# Patient Record
Sex: Female | Born: 1955 | Race: White | Hispanic: No | Marital: Single | Smoking: Former smoker
Health system: Southern US, Community
[De-identification: ages and names within clinical notes are randomized; demographics above are authoritative.]

## PROBLEM LIST (undated history)

## (undated) DIAGNOSIS — K219 Gastro-esophageal reflux disease without esophagitis: Secondary | ICD-10-CM

## (undated) DIAGNOSIS — E079 Disorder of thyroid, unspecified: Secondary | ICD-10-CM

## (undated) DIAGNOSIS — J449 Chronic obstructive pulmonary disease, unspecified: Secondary | ICD-10-CM

## (undated) DIAGNOSIS — I639 Cerebral infarction, unspecified: Secondary | ICD-10-CM

## (undated) DIAGNOSIS — I10 Essential (primary) hypertension: Secondary | ICD-10-CM

## (undated) DIAGNOSIS — E785 Hyperlipidemia, unspecified: Secondary | ICD-10-CM

## (undated) DIAGNOSIS — E119 Type 2 diabetes mellitus without complications: Secondary | ICD-10-CM

## (undated) HISTORY — PX: APPENDECTOMY: SHX54

## (undated) HISTORY — DX: Disorder of thyroid, unspecified: E07.9

## (undated) HISTORY — PX: OTHER SURGICAL HISTORY: SHX169

## (undated) HISTORY — DX: Hyperlipidemia, unspecified: E78.5

## (undated) HISTORY — DX: Type 2 diabetes mellitus without complications: E11.9

## (undated) HISTORY — PX: TUMOR REMOVAL: SHX12

## (undated) HISTORY — DX: Essential (primary) hypertension: I10

## (undated) HISTORY — PX: BREAST BIOPSY: SHX20

## (undated) HISTORY — PX: TOTAL VAGINAL HYSTERECTOMY: SHX2548

---

## 2004-10-20 ENCOUNTER — Emergency Department: Payer: Self-pay | Admitting: Emergency Medicine

## 2006-12-24 DIAGNOSIS — M419 Scoliosis, unspecified: Secondary | ICD-10-CM | POA: Insufficient documentation

## 2006-12-24 DIAGNOSIS — M1990 Unspecified osteoarthritis, unspecified site: Secondary | ICD-10-CM | POA: Insufficient documentation

## 2006-12-24 DIAGNOSIS — E039 Hypothyroidism, unspecified: Secondary | ICD-10-CM | POA: Insufficient documentation

## 2006-12-26 ENCOUNTER — Emergency Department: Payer: Self-pay | Admitting: Emergency Medicine

## 2007-01-03 ENCOUNTER — Ambulatory Visit: Payer: Self-pay | Admitting: *Deleted

## 2007-06-14 ENCOUNTER — Ambulatory Visit: Payer: Self-pay

## 2008-10-06 ENCOUNTER — Emergency Department: Payer: Self-pay | Admitting: Emergency Medicine

## 2009-09-05 DIAGNOSIS — E7800 Pure hypercholesterolemia, unspecified: Secondary | ICD-10-CM | POA: Insufficient documentation

## 2010-12-10 ENCOUNTER — Emergency Department: Payer: Self-pay | Admitting: Unknown Physician Specialty

## 2011-03-09 ENCOUNTER — Emergency Department: Payer: Self-pay | Admitting: Emergency Medicine

## 2011-06-04 ENCOUNTER — Emergency Department: Payer: Self-pay | Admitting: *Deleted

## 2012-03-23 DIAGNOSIS — M6530 Trigger finger, unspecified finger: Secondary | ICD-10-CM | POA: Insufficient documentation

## 2012-03-23 DIAGNOSIS — J309 Allergic rhinitis, unspecified: Secondary | ICD-10-CM | POA: Insufficient documentation

## 2012-03-23 DIAGNOSIS — E559 Vitamin D deficiency, unspecified: Secondary | ICD-10-CM | POA: Insufficient documentation

## 2013-02-14 ENCOUNTER — Ambulatory Visit: Payer: Self-pay | Admitting: Family Medicine

## 2013-02-21 ENCOUNTER — Ambulatory Visit: Payer: Self-pay | Admitting: Family Medicine

## 2013-09-30 ENCOUNTER — Emergency Department: Payer: Self-pay | Admitting: Internal Medicine

## 2013-09-30 LAB — COMPREHENSIVE METABOLIC PANEL
Albumin: 3.7 g/dL (ref 3.4–5.0)
Alkaline Phosphatase: 118 U/L — ABNORMAL HIGH
Anion Gap: 6 — ABNORMAL LOW (ref 7–16)
BUN: 27 mg/dL — ABNORMAL HIGH (ref 7–18)
Co2: 31 mmol/L (ref 21–32)
EGFR (African American): >60
EGFR (Non-African Amer.): 59 — ABNORMAL LOW
Glucose: 207 mg/dL — ABNORMAL HIGH (ref 65–99)
Osmolality: 283 (ref 275–301)
Potassium: 3.7 mmol/L (ref 3.5–5.1)
SGOT(AST): 21 U/L (ref 15–37)
SGPT (ALT): 32 U/L (ref 12–78)
Sodium: 136 mmol/L (ref 136–145)
Total Protein: 7.3 g/dL (ref 6.4–8.2)

## 2013-09-30 LAB — CBC
MCV: 87 fL (ref 80–100)
RDW: 13.4 % (ref 11.5–14.5)
WBC: 7.4 10*3/uL (ref 3.6–11.0)

## 2013-09-30 LAB — PROTIME-INR
INR: 0.8
Prothrombin Time: 11.3 secs — ABNORMAL LOW (ref 11.5–14.7)

## 2013-09-30 LAB — TROPONIN I: Troponin-I: <0.02 ng/mL

## 2013-09-30 LAB — CK TOTAL AND CKMB (NOT AT ARMC): CK, Total: 80 U/L (ref 21–215)

## 2013-10-12 ENCOUNTER — Encounter: Payer: Self-pay | Admitting: Cardiovascular Disease

## 2013-10-12 ENCOUNTER — Ambulatory Visit (INDEPENDENT_AMBULATORY_CARE_PROVIDER_SITE_OTHER): Payer: Self-pay | Admitting: Cardiovascular Disease

## 2013-10-12 VITALS — BP 138/72 | HR 94 | Ht 61.0 in | Wt 187.8 lb

## 2013-10-12 DIAGNOSIS — R0789 Other chest pain: Secondary | ICD-10-CM

## 2013-10-12 DIAGNOSIS — F17200 Nicotine dependence, unspecified, uncomplicated: Secondary | ICD-10-CM

## 2013-10-12 DIAGNOSIS — I10 Essential (primary) hypertension: Secondary | ICD-10-CM

## 2013-10-12 DIAGNOSIS — E119 Type 2 diabetes mellitus without complications: Secondary | ICD-10-CM | POA: Insufficient documentation

## 2013-10-12 DIAGNOSIS — E669 Obesity, unspecified: Secondary | ICD-10-CM

## 2013-10-12 DIAGNOSIS — E1169 Type 2 diabetes mellitus with other specified complication: Secondary | ICD-10-CM | POA: Insufficient documentation

## 2013-10-12 MED ORDER — NITROGLYCERIN 0.4 MG SL SUBL: 0.4000 mg | SUBLINGUAL_TABLET | SUBLINGUAL | Status: DC | PRN

## 2013-10-12 NOTE — Assessment & Plan Note (Signed)
We have encouraged continued exercise, careful diet management in an effort to lose weight. 

## 2013-10-12 NOTE — Assessment & Plan Note (Signed)
Blood pressure is well controlled on today's visit. No changes made to the medications. She does complain about nocturia. Suggested she not drink as much fluids after dinner. Potentially could change HCTZ if symptoms persist. We'll try alternate blood pressure medication such as beta blockers as her heart rate is high, possibly even Cardizem

## 2013-10-12 NOTE — Assessment & Plan Note (Signed)
We have encouraged her to continue to work on weaning her cigarettes and smoking cessation. She will continue to work on this and does not want any assistance with chantix.  

## 2013-10-12 NOTE — Patient Instructions (Addendum)
You are doing well. Please take aspirin one a day  Take nitroglycerin under the tongue as needed   Goal total cholesterol  <150 LDL <70 Given that you are a smoker and diabetic  Call if you have more chest pain We would do a stress treadmill  test  Syndrome with the low blood pressures is vaso vagal, near-syncope Happens from Gi distress, or pain  Please call us if you have new issues that need to be addressed before your next appt.  Your physician wants you to follow-up in: 6 months.  You will receive a reminder letter in the mail two months in advance. If you don't receive a letter, please call our office to schedule the follow-up appointment.

## 2013-10-12 NOTE — Progress Notes (Signed)
Patient ID: Michele House, female    DOB: [birth year], 57 y.o.   MRN: [id number]  HPI Comments: Michele House is a 57 year old woman with obesity, diabetes, long smoking history of 40 years, hypertension who presents for recent episodes of chest pain.  She reports having 2 or 3 episodes of chest pain, the most recent episode 09/30/2013. Prior to that she had an episode 3 or 4 months ago. 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 She had a fluttering, tingling feeling in her mid sternum and felt "weird". Symptoms resolved ultimately quickly. She denies any significant symptoms with exertion on a regular basis. No further episodes since the end of November in the past 2 weeks.  She continues to smoke at least one pack per day. She reports she has borderline diabetes, try to do better on her diet. She reports her heart rate is sometimes elevated, worse with stress. No significant shortness of breath with exertion. Otherwise relatively active. She reports having frequent urination at nighttime which she attributes to HCTZ and drinking fluids late at night. Previous lightheaded spells, blackout spells on higher dose glipizide. Better on lower dose. She recently stopped her lovastatin as she heard that she could only take this with coenzyme Q10  EKG shows normal sinus rhythm with rate 94 beats per minute, left axis deviation, poor R-wave progression through the anterior precordial leads  Lab work from the hospital 09/30/2013 shows negative cardiac enzymes, normal LFTs, normal renal function, potassium 3.7, chest x-ray normal EKG in the hospital showing sinus tachycardia with rate 101 beats per minute no significant ST or T wave changes   Outpatient Encounter Prescriptions as of 10/12/2013  Medication Sig  . Cholecalciferol (VITAMIN D PO) Take by mouth daily.  Marland Kitchen glipiZIDE (GLUCOTROL XL) 2.5 MG 24 hr tablet Take 2.5 mg by mouth  daily with breakfast.  . levothyroxine (SYNTHROID, LEVOTHROID) 100 MCG tablet Take 100 mcg by mouth daily before breakfast.  . lisinopril-hydrochlorothiazide (PRINZIDE,ZESTORETIC) 20-25 MG per tablet Take 1 tablet by mouth daily.  Marland Kitchen LOVASTATIN PO Take 20 mg by mouth at bedtime.   . nitroGLYCERIN (NITROSTAT) 0.4 MG SL tablet Place 1 tablet (0.4 mg total) under the tongue every 5 (five) minutes as needed for chest pain.     Review of Systems  Constitutional: Negative.   HENT: Negative.   Eyes: Negative.   Respiratory: Negative.   Cardiovascular: Negative.   Gastrointestinal: Negative.   Endocrine: Negative.   Musculoskeletal: Negative.   Skin: Negative.   Allergic/Immunologic: Negative.   Neurological: Negative.   Hematological: Negative.   Psychiatric/Behavioral: Negative.   All other systems reviewed and are negative.    BP 138/72  Pulse 94  Ht 5\' 1"  (1.549 m)  Wt 187 lb 12 oz (85.163 kg)  BMI 35.49 kg/m2  Physical Exam  Nursing note and vitals reviewed. Constitutional: She is oriented to person, place, and time. She appears well-developed and well-nourished.  HENT:  Head: Normocephalic.  Nose: Nose normal.  Mouth/Throat: Oropharynx is clear and moist.  Eyes: Conjunctivae are normal. Pupils are equal, round, and reactive to light.  Neck: Normal range of motion. Neck supple. No JVD present.  Cardiovascular: Normal rate, regular rhythm, S1 normal, S2 normal, normal heart sounds and intact distal pulses.  Exam reveals no gallop and no friction rub.   No murmur heard. Pulmonary/Chest: Effort normal and breath sounds normal. No respiratory distress. She has no  wheezes. She has no rales. She exhibits no tenderness.  Abdominal: Soft. Bowel sounds are normal. She exhibits no distension. There is no tenderness.  Musculoskeletal: Normal range of motion. She exhibits no edema and no tenderness.  Lymphadenopathy:    She has no cervical adenopathy.  Neurological: She is alert and  oriented to person, place, and time. Coordination normal.  Skin: Skin is warm and dry. No rash noted. No erythema.  Psychiatric: She has a normal mood and affect. Her behavior is normal. Judgment and thought content normal.    Assessment and Plan

## 2013-10-12 NOTE — Assessment & Plan Note (Signed)
Chest pain is atypical in nature. Very rare episodes over the past year. We spent a long time discussing her symptoms and what to look for, also workup for ischemia. She would like to wait at this time. We have suggested if she has additional episodes, routine treadmill testing could be done. She does not want a Myoview

## 2013-10-12 NOTE — Assessment & Plan Note (Addendum)
Dietary diet given to her today. Long discussion about types of foods she should avoid

## 2013-10-25 ENCOUNTER — Emergency Department: Payer: Self-pay | Admitting: Emergency Medicine

## 2014-02-01 DIAGNOSIS — R5381 Other malaise: Secondary | ICD-10-CM | POA: Insufficient documentation

## 2014-05-01 DIAGNOSIS — M25519 Pain in unspecified shoulder: Secondary | ICD-10-CM | POA: Insufficient documentation

## 2014-07-03 DIAGNOSIS — M545 Low back pain, unspecified: Secondary | ICD-10-CM | POA: Insufficient documentation

## 2014-07-30 ENCOUNTER — Ambulatory Visit: Payer: Self-pay

## 2014-08-31 ENCOUNTER — Emergency Department: Payer: Self-pay | Admitting: Emergency Medicine

## 2014-08-31 LAB — URINALYSIS, COMPLETE
Bilirubin,UR: NEGATIVE
Glucose,UR: NEGATIVE mg/dL (ref 0–75)
Hyaline Cast: 4
KETONE: NEGATIVE
Nitrite: NEGATIVE
PH: 5 (ref 4.5–8.0)
Specific Gravity: 1.015 (ref 1.003–1.030)
WBC UR: 422 /HPF (ref 0–5)

## 2014-09-19 DIAGNOSIS — F418 Other specified anxiety disorders: Secondary | ICD-10-CM | POA: Insufficient documentation

## 2014-09-25 ENCOUNTER — Telehealth: Payer: Self-pay | Admitting: Cardiovascular Disease

## 2014-09-25 NOTE — Telephone Encounter (Signed)
Request from Disability Determination services , sent to [employer] on 09/25/14 . °

## 2014-10-02 DIAGNOSIS — J019 Acute sinusitis, unspecified: Secondary | ICD-10-CM | POA: Insufficient documentation

## 2014-10-10 ENCOUNTER — Ambulatory Visit: Payer: Self-pay | Admitting: Family Medicine

## 2014-12-19 ENCOUNTER — Ambulatory Visit: Payer: Self-pay

## 2014-12-24 ENCOUNTER — Ambulatory Visit: Payer: Self-pay

## 2015-05-08 DIAGNOSIS — R1310 Dysphagia, unspecified: Secondary | ICD-10-CM | POA: Insufficient documentation

## 2015-05-28 DIAGNOSIS — M25511 Pain in right shoulder: Secondary | ICD-10-CM | POA: Insufficient documentation

## 2015-06-10 ENCOUNTER — Ambulatory Visit: Payer: Medicaid Other | Attending: Nurse Practitioner | Admitting: Physical Therapy

## 2015-06-10 DIAGNOSIS — M25511 Pain in right shoulder: Secondary | ICD-10-CM | POA: Diagnosis present

## 2015-06-10 NOTE — Therapy (Signed)
[facility] [address] [place], [address] Phone: [phone]   Fax:  [phone]  Physical Therapy Evaluation  Patient Details  Name: Michele House MRN: [id number] Date of Birth: 11-Jan-1956 Referring Provider:  Veneda Melter, FNP  Encounter Date: 06/10/2015      PT End of Session - 06/10/15 0846    Visit Number 1   Number of Visits 1   PT Start Time 0800   PT Stop Time 0835   PT Time Calculation (min) 35 min   Activity Tolerance Patient tolerated treatment well   Behavior During Therapy [name] for tasks assessed/performed      Past Medical History  Diagnosis Date  . Hypertension   . Hyperlipidemia   . Thyroid disease   . Diabetes mellitus without complication     Past Surgical History  Procedure Laterality Date  . Tumor removal      benign;stomach  . Total vaginal hysterectomy    . Appendectomy      There were no vitals filed for this visit.  Visit Diagnosis:  Pain in joint, shoulder region, right - Plan: PT plan of care cert/re-cert      Subjective Assessment - 06/10/15 0831    Subjective Patient reports she was throwing a tennis ball in the pool one day and her shoulder has been hurting ever since.    Limitations House hold activities;Other (comment)  dessing/bathing   Patient Stated Goals to have less pain   Currently in Pain? Yes   Pain Score 5    Pain Location Shoulder   Pain Orientation Right   Pain Descriptors / Indicators Sore;Aching   Pain Type Chronic pain   Pain Onset More than a month ago   Pain Frequency Intermittent   Effect of Pain on Daily Activities difficulty with bathing and dressing activities   Multiple Pain Sites No            [facility] PT Assessment - 06/10/15 0001    Assessment   Medical Diagnosis shoulder pain   Onset Date/Surgical Date 04/03/15   Hand Dominance Left   Prior Therapy no   Balance Screen   Has the patient fallen in the past 6  months No   Has the patient had a decrease in activity level because of a fear of falling?  No   Is the patient reluctant to leave their home because of a fear of falling?  No   Home Environment   Living Environment Private residence   Available Help at Discharge Family   Type of Home Mobile home   Prior Function   Level of Independence Independent   Cognition   Overall Cognitive Status Within Functional Limits for tasks assessed   Observation/Other Assessments   Quick DASH  9%   Sensation   Light Touch Appears Intact   ROM / Strength   AROM / PROM / Strength AROM;PROM;Strength   AROM   Overall AROM  Deficits;Due to pain   Overall AROM Comments pain at end range with flexion and abduction. Pt has significantly limited IR.   AROM Assessment Site Shoulder   Right/Left Shoulder Right   PROM   Overall PROM  Deficits;Due to pain   Overall PROM Comments continues to have pain with PROM at end range   Strength   Overall Strength Within functional limits for tasks performed   Strength Assessment Site Shoulder  PT Education - 06/10/15 0844    Education provided Yes   Education Details HEP for shoulder cane exercises to improve ROM.    Person(s) Educated Patient   Methods Demonstration;Handout;Explanation   Comprehension Verbalized understanding;Returned demonstration;Verbal cues required             PT Long Term Goals - 06/10/15 0853    PT LONG TERM GOAL #1   Title Patient will perform HEP.   Period Days   Status New               Plan - 06/10/15 0848    Clinical Impression Statement Patient is a 59 year old female reporting right shoulder pain after throwing a tennis ball at the pool with grandchildren. Pt has pain at end range of abduction and flexion. Pt is most limited in IR, where she also has the most pain.    Pt will benefit from skilled therapeutic intervention in order to improve on the following deficits  Decreased range of motion;Pain   Rehab Potential Good   PT Frequency One time visit   Recommended Other Services patient instructed to perform HEP 2x perday and give it 2 weeks or so. If no improvement, she should call her MD.   Earlyne Iba and Agree with Plan of Care Patient         Problem List Patient Active Problem List   Diagnosis Date Noted  . Chest discomfort 10/12/2013  . Smoker 10/12/2013  . Essential hypertension 10/12/2013  . Obesity 10/12/2013  . Diabetes mellitus type 2 in obese 10/12/2013    Acey Woodfield, PT, MPT, GCS 06/10/2015, 8:56 AM  [place] [facility] [address], [place], [address] Phone: [phone]   Fax:  [phone]

## 2015-07-10 ENCOUNTER — Encounter: Payer: Self-pay | Admitting: Emergency Medicine

## 2015-07-10 ENCOUNTER — Emergency Department
Admission: EM | Admit: 2015-07-10 | Discharge: 2015-07-10 | Disposition: A | Discharge status: Home / Self Care | Payer: Medicaid Other | Attending: Emergency Medicine | Admitting: Emergency Medicine

## 2015-07-10 ENCOUNTER — Emergency Department: Payer: Medicaid Other

## 2015-07-10 DIAGNOSIS — R112 Nausea with vomiting, unspecified: Secondary | ICD-10-CM | POA: Diagnosis not present

## 2015-07-10 DIAGNOSIS — I10 Essential (primary) hypertension: Secondary | ICD-10-CM | POA: Insufficient documentation

## 2015-07-10 DIAGNOSIS — Z79899 Other long term (current) drug therapy: Secondary | ICD-10-CM | POA: Diagnosis not present

## 2015-07-10 DIAGNOSIS — R42 Dizziness and giddiness: Secondary | ICD-10-CM | POA: Insufficient documentation

## 2015-07-10 DIAGNOSIS — Z720 Tobacco use: Secondary | ICD-10-CM | POA: Insufficient documentation

## 2015-07-10 DIAGNOSIS — Z79811 Long term (current) use of aromatase inhibitors: Secondary | ICD-10-CM | POA: Diagnosis not present

## 2015-07-10 DIAGNOSIS — E119 Type 2 diabetes mellitus without complications: Secondary | ICD-10-CM | POA: Diagnosis not present

## 2015-07-10 HISTORY — DX: Gastro-esophageal reflux disease without esophagitis: K21.9

## 2015-07-10 LAB — BASIC METABOLIC PANEL
Anion gap: 11 (ref 5–15)
BUN: 16 mg/dL (ref 6–20)
CALCIUM: 9.5 mg/dL (ref 8.9–10.3)
CO2: 29 mmol/L (ref 22–32)
Chloride: 97 mmol/L — ABNORMAL LOW (ref 101–111)
Creatinine, Ser: 0.75 mg/dL (ref 0.44–1.00)
GFR calc Af Amer: >60 mL/min (ref >60)
GLUCOSE: 149 mg/dL — AB (ref 65–99)
POTASSIUM: 4.5 mmol/L (ref 3.5–5.1)
Sodium: 137 mmol/L (ref 135–145)

## 2015-07-10 LAB — CBC
HEMATOCRIT: 44.1 % (ref 35.0–47.0)
Hemoglobin: 14.5 g/dL (ref 12.0–16.0)
MCH: 27.6 pg (ref 26.0–34.0)
MCHC: 32.9 g/dL (ref 32.0–36.0)
MCV: 83.9 fL (ref 80.0–100.0)
Platelets: 300 10*3/uL (ref 150–440)
RBC: 5.26 MIL/uL — ABNORMAL HIGH (ref 3.80–5.20)
RDW: 14.2 % (ref 11.5–14.5)
WBC: 10.9 10*3/uL (ref 3.6–11.0)

## 2015-07-10 LAB — URINALYSIS COMPLETE WITH MICROSCOPIC (ARMC ONLY)
BACTERIA UA: NONE SEEN
BILIRUBIN URINE: NEGATIVE
GLUCOSE, UA: NEGATIVE mg/dL
HGB URINE DIPSTICK: NEGATIVE
Ketones, ur: NEGATIVE mg/dL
Leukocytes, UA: NEGATIVE
NITRITE: NEGATIVE
Protein, ur: NEGATIVE mg/dL
Specific Gravity, Urine: 1.016 (ref 1.005–1.030)
pH: 7 (ref 5.0–8.0)

## 2015-07-10 LAB — TROPONIN I: Troponin I: <0.03 ng/mL (ref <0.031)

## 2015-07-10 MED ORDER — MECLIZINE HCL 25 MG PO TABS: 25.0000 mg | ORAL_TABLET | Freq: Three times a day (TID) | ORAL | Status: DC | PRN

## 2015-07-10 MED ORDER — ONDANSETRON 4 MG PO TBDP
4.0000 mg | ORAL_TABLET | Freq: Once | ORAL | Status: AC
  Administered 2015-07-10: 4 mg via ORAL
  Filled 2015-07-10: qty 1

## 2015-07-10 MED ORDER — MECLIZINE HCL 25 MG PO TABS
25.0000 mg | ORAL_TABLET | Freq: Once | ORAL | Status: AC
  Administered 2015-07-10: 25 mg via ORAL
  Filled 2015-07-10: qty 1

## 2015-07-10 MED ORDER — ONDANSETRON HCL 4 MG PO TABS: 4.0000 mg | ORAL_TABLET | Freq: Three times a day (TID) | ORAL | Status: DC | PRN

## 2015-07-10 NOTE — ED Notes (Addendum)
Pt to ED from home via EMS, states she started feeling dizzy and nauseated last night and she forgot to take her medicine before bed except the one for her high blood pressure. Pt takes lisinopril-HCTZ for HTN.

## 2015-07-10 NOTE — ED Provider Notes (Signed)
[facility] Emergency Department Provider Note   ____________________________________________  Time seen: 5 PM I have reviewed the triage vital signs and the triage nursing note.  HISTORY  Chief Complaint Dizziness; Nausea; and Emesis   Historian Patient  HPI Michele House is a 59 y.o. female who states that yesterday she started having acute dizziness which is worse with movement of her head or changing of position, and associated with severe nausea with some vomiting. She thought it could be due to her blood glucose, but this was checked and was normal. She thought this could be due to her blood pressure, however her blood pressure was slightly elevated yesterday due to forgetting her medication, but today she did take her medication and her blood pressure has been essentially normal. No chest pain or palpitations. No trouble breathing or shortness of breath. No fevers. No confusion or altered mental status. No weakness or numbness. No history of stroke. Symptoms are moderate to severe.    Past Medical History  Diagnosis Date  . Hypertension   . Hyperlipidemia   . Thyroid disease   . Diabetes mellitus without complication   . Acid reflux     Patient Active Problem List   Diagnosis Date Noted  . Chest discomfort 10/12/2013  . Smoker 10/12/2013  . Essential hypertension 10/12/2013  . Obesity 10/12/2013  . Diabetes mellitus type 2 in obese 10/12/2013    Past Surgical History  Procedure Laterality Date  . Tumor removal      benign;stomach  . Total vaginal hysterectomy    . Appendectomy      Current Outpatient Rx  Name  Route  Sig  Dispense  Refill  . Cholecalciferol (VITAMIN D PO)   Oral   Take by mouth daily.         Marland Kitchen glipiZIDE (GLUCOTROL XL) 2.5 MG 24 hr tablet   Oral   Take 2.5 mg by mouth daily with breakfast.         . levothyroxine (SYNTHROID, LEVOTHROID) 100 MCG tablet   Oral   Take 100 mcg by mouth daily before  breakfast.         . lisinopril-hydrochlorothiazide (PRINZIDE,ZESTORETIC) 20-25 MG per tablet   Oral   Take 1 tablet by mouth daily.         Marland Kitchen LOVASTATIN PO   Oral   Take 20 mg by mouth at bedtime.          . meclizine (ANTIVERT) 25 MG tablet   Oral   Take 1 tablet (25 mg total) by mouth 3 (three) times daily as needed for dizziness or nausea.   20 tablet   0   . nitroGLYCERIN (NITROSTAT) 0.4 MG SL tablet   Sublingual   Place 1 tablet (0.4 mg total) under the tongue every 5 (five) minutes as needed for chest pain.   25 tablet   3   . ondansetron (ZOFRAN) 4 MG tablet   Oral   Take 1 tablet (4 mg total) by mouth every 8 (eight) hours as needed for nausea or vomiting.   10 tablet   0     Allergies Aspirin; Percocet; and Sulfa antibiotics  Family History  Problem Relation Age of Onset  . Heart attack Mother   . Hypertension Mother     Social History Social History  Substance Use Topics  . Smoking status: Current Every Day Smoker -- 0.50 packs/day for 30 years    Types: Cigarettes  . Smokeless tobacco: None  .  Alcohol Use: No    Review of Systems  Constitutional: Negative for fever. Eyes: Room spinning with change of position or movement of the head. No blurry vision or double vision.. ENT: Negative for sore throat. No sinus congestion. Cardiovascular: Negative for chest pain. No palpitations. Respiratory: Negative for shortness of breath. Gastrointestinal: Negative for abdominal pain or diarrhea. Genitourinary: Negative for dysuria. Musculoskeletal: Negative for back pain. Skin: Negative for rash. Neurological: Negative for headache. 10 point Review of Systems otherwise negative ____________________________________________   PHYSICAL EXAM:  VITAL SIGNS: ED Triage Vitals  Enc Vitals Group     BP 07/10/15 1606 156/80 mmHg     Pulse Rate 07/10/15 1606 93     Resp 07/10/15 1606 16     Temp 07/10/15 1606 97.9 F (36.6 C)     Temp Source 07/10/15  1606 Oral     SpO2 07/10/15 1606 94 %     Weight 07/10/15 1606 189 lb 3.2 oz (85.821 kg)     Height 07/10/15 1606 5\' 1"  (1.549 m)     Head Cir --      Peak Flow --      Pain Score --      Pain Loc --      Pain Edu? --      Excl. in GC? --      Constitutional: Alert and oriented. Well appearing and in no distress. Eyes: Conjunctivae are normal. PERRL. Normal extraocular movements. No nystagmus noted. ENT   Head: Normocephalic and atraumatic.   Nose: No congestion/rhinnorhea.   Mouth/Throat: Mucous membranes are moist.   Neck: No stridor. Cardiovascular/Chest: Normal rate, regular rhythm.  No murmurs, rubs, or gallops. Respiratory: Normal respiratory effort without tachypnea nor retractions. Breath sounds are clear and equal bilaterally. No wheezes/rales/rhonchi. Gastrointestinal: Soft. No distention, no guarding, no rebound. Nontender   Genitourinary/rectal:Deferred Musculoskeletal: Nontender with normal range of motion in all extremities. No joint effusions.  No lower extremity tenderness.  No edema. Neurologic:  Normal speech and language. No gross or focal neurologic deficits are appreciated. Cranial nerves II through X are intact. 5 out of 5 strength in 4 extremities. Coordination intact. Patient is onset of vertiginous symptoms with movement of the head side to side. Skin:  Skin is warm, dry and intact. No rash noted. Psychiatric: Mood and affect are normal. Speech and behavior are normal. Patient exhibits appropriate insight and judgment.  ____________________________________________   EKG I, Governor Rooks, MD, the attending physician have personally viewed and interpreted all ECGs.  90 bpm. Normal sinus rhythm. Left axis deviation. Normal ST and T-wave. ____________________________________________  LABS (pertinent positives/negatives)  Basic metabolic panel without significant abnormality CBC without significant abnormalities Troponin less than  0.03 Urinalysis negative  ____________________________________________  RADIOLOGY All Xrays were viewed by me. Imaging interpreted by Radiologist.  CT head noncontrast:   IMPRESSION: Age-related involutional change with no acute findings __________________________________________  PROCEDURES  Procedure(s) performed: None  Critical Care performed: None  ____________________________________________   ED COURSE / ASSESSMENT AND PLAN  CONSULTATIONS: None  Pertinent labs & imaging results that were available during my care of the patient were reviewed by me and considered in my medical decision making (see chart for details).   Patient's symptoms seem most consistent with benign positional vertigo. She is given symptomatic medications of meclizine and Zofran here in the emergency department with some improvement. Laboratory evaluation is reassuring. Clinical, imaging, and laboratory evaluation not consistent with a cardiac, stroke, metabolic, or infectious source of dizziness.  -----------------------------------------  7:42 PM on 07/10/2015 -----------------------------------------  Patient feels much better and has no longer having vertiginous symptoms after symptomatic medications. Patient ready for discharge and outpatient follow-up.  Patient / Family / Caregiver informed of clinical course, medical decision-making process, and agree with plan.   I discussed return precautions, follow-up instructions, and discharged instructions with patient and/or family.  ___________________________________________   FINAL CLINICAL IMPRESSION(S) / ED DIAGNOSES   Final diagnoses:  Vertigo       Governor Rooks, MD 07/10/15 [phone]

## 2015-07-10 NOTE — ED Notes (Signed)
AAOx3.  Skin warm and dry.  NAD.  Moving all extremities equally and strong.  D/C home.

## 2015-07-10 NOTE — Discharge Instructions (Signed)
You were evaluated for dizziness, and your symptoms are consistent with benign positional vertigo. You're being prescribed nausea medicine called Zofran, and dizziness and called meclizine to help with symptoms. If you have symptoms that persist past 1 week, I recommended following up with Dr. Willeen Cass, ears nose throat specialist.  Return to the emergency department for any worsening condition including chest pain or palpitations, trouble breathing, weakness, numbness, altered mental status or confusion.   Benign Positional Vertigo Vertigo means you feel like you or your surroundings are moving when they are not. Benign positional vertigo is the most common form of vertigo. Benign means that the cause of your condition is not serious. Benign positional vertigo is more common in older adults. CAUSES  Benign positional vertigo is the result of an upset in the labyrinth system. This is an area in the middle ear that helps control your balance. This may be caused by a viral infection, head injury, or repetitive motion. However, often no specific cause is found. SYMPTOMS  Symptoms of benign positional vertigo occur when you move your head or eyes in different directions. Some of the symptoms may include:  Loss of balance and falls.  Vomiting.  Blurred vision.  Dizziness.  Nausea.  Involuntary eye movements (nystagmus). DIAGNOSIS  Benign positional vertigo is usually diagnosed by physical exam. If the specific cause of your benign positional vertigo is unknown, your caregiver may perform imaging tests, such as magnetic resonance imaging (MRI) or computed tomography (CT). TREATMENT  Your caregiver may recommend movements or procedures to correct the benign positional vertigo. Medicines such as meclizine, benzodiazepines, and medicines for nausea may be used to treat your symptoms. In rare cases, if your symptoms are caused by certain conditions that affect the inner ear, you may need  surgery. HOME CARE INSTRUCTIONS   Follow your caregiver's instructions.  Move slowly. Do not make sudden body or head movements.  Avoid driving.  Avoid operating heavy machinery.  Avoid performing any tasks that would be dangerous to you or others during a vertigo episode.  Drink enough fluids to keep your urine clear or pale yellow. SEEK IMMEDIATE MEDICAL CARE IF:   You develop problems with walking, weakness, numbness, or using your arms, hands, or legs.  You have difficulty speaking.  You develop severe headaches.  Your nausea or vomiting continues or gets worse.  You develop visual changes.  Your family or friends notice any behavioral changes.  Your condition gets worse.  You have a fever.  You develop a stiff neck or sensitivity to light. MAKE SURE YOU:   Understand these instructions.  Will watch your condition.  Will get help right away if you are not doing well or get worse. Document Released: 07/27/2006 Document Revised: 01/11/2012 Document Reviewed: 07/09/2011 [facility] Patient Information 2015 [place], [place]. This information is not intended to replace advice given to you by your health care provider. Make sure you discuss any questions you have with your health care provider.

## 2015-07-16 DIAGNOSIS — R42 Dizziness and giddiness: Secondary | ICD-10-CM | POA: Insufficient documentation

## 2015-12-25 ENCOUNTER — Ambulatory Visit: Payer: Medicaid Other | Attending: Oncology

## 2015-12-25 ENCOUNTER — Ambulatory Visit
Admission: RE | Admit: 2015-12-25 | Discharge: 2015-12-25 | Disposition: A | Discharge status: Home / Self Care | Payer: Self-pay | Source: Ambulatory Visit | Attending: Oncology | Admitting: Oncology

## 2015-12-25 VITALS — BP 155/95 | HR 97 | Temp 98.2°F | Resp 16 | Ht 62.21 in | Wt 188.1 lb

## 2015-12-25 DIAGNOSIS — Z0000 Encounter for general adult medical examination without abnormal findings: Secondary | ICD-10-CM

## 2015-12-25 NOTE — Progress Notes (Signed)
Subjective:     Patient ID: Michele House, female   DOB: 11/13/1955, 60 y.o.   MRN: [id number]  HPI   Review of Systems     Objective:   Physical Exam  Pulmonary/Chest: Right breast exhibits no inverted nipple, no mass, no nipple discharge, no skin change and no tenderness. Left breast exhibits no inverted nipple, no mass, no nipple discharge, no skin change and no tenderness. Breasts are symmetrical.       Assessment:     60 year old patient presents for [facility] clinic visit.  Patient screened, and meets B[facility]CP eligibility.  Patient does not have insurance, Medicare or Medicaid.  Handout given on Affordable Care Act.  Instructed patient on breast self-exam using teach back method.  CBE unremarkable.  No mass or lump palpated.        Plan:     Sent for bilateral screening mammogram.

## 2016-01-02 NOTE — Progress Notes (Signed)
Letter mailed from [facility] to notify of normal mammogram results.  Patient to return in one year for annual screening.  Copy to [facility]. 

## 2016-01-06 ENCOUNTER — Encounter: Payer: Self-pay | Admitting: Emergency Medicine

## 2016-01-06 DIAGNOSIS — E669 Obesity, unspecified: Secondary | ICD-10-CM | POA: Insufficient documentation

## 2016-01-06 DIAGNOSIS — Z79899 Other long term (current) drug therapy: Secondary | ICD-10-CM | POA: Insufficient documentation

## 2016-01-06 DIAGNOSIS — I10 Essential (primary) hypertension: Secondary | ICD-10-CM | POA: Insufficient documentation

## 2016-01-06 DIAGNOSIS — E1169 Type 2 diabetes mellitus with other specified complication: Secondary | ICD-10-CM | POA: Insufficient documentation

## 2016-01-06 DIAGNOSIS — Z7984 Long term (current) use of oral hypoglycemic drugs: Secondary | ICD-10-CM | POA: Insufficient documentation

## 2016-01-06 DIAGNOSIS — Z87891 Personal history of nicotine dependence: Secondary | ICD-10-CM | POA: Insufficient documentation

## 2016-01-06 NOTE — ED Notes (Signed)
Pt presents to ED with high blood pressure. Pt states she was seen by her pcp on Tuesday and pt blood pressure medication was changed to "save her kidneys". Pt states her blood pressure has been elevates since then. At home tonight pt blood pressure was >200 in addition to feeling like she has indigestion. Denies sob or chest pain.

## 2016-01-07 ENCOUNTER — Emergency Department
Admission: EM | Admit: 2016-01-07 | Discharge: 2016-01-07 | Disposition: A | Discharge status: Home / Self Care | Payer: Self-pay | Attending: Emergency Medicine | Admitting: Emergency Medicine

## 2016-01-07 DIAGNOSIS — I10 Essential (primary) hypertension: Secondary | ICD-10-CM

## 2016-01-07 LAB — COMPREHENSIVE METABOLIC PANEL
ALK PHOS: 92 U/L (ref 38–126)
ALT: 20 U/L (ref 14–54)
AST: 19 U/L (ref 15–41)
Albumin: 4.2 g/dL (ref 3.5–5.0)
Anion gap: 8 (ref 5–15)
BUN: 14 mg/dL (ref 6–20)
CO2: 29 mmol/L (ref 22–32)
CREATININE: 0.81 mg/dL (ref 0.44–1.00)
Calcium: 9.7 mg/dL (ref 8.9–10.3)
Chloride: 109 mmol/L (ref 101–111)
Glucose, Bld: 78 mg/dL (ref 65–99)
Potassium: 3.8 mmol/L (ref 3.5–5.1)
Sodium: 146 mmol/L — ABNORMAL HIGH (ref 135–145)
TOTAL PROTEIN: 7.1 g/dL (ref 6.5–8.1)
Total Bilirubin: 0.6 mg/dL (ref 0.3–1.2)

## 2016-01-07 LAB — CBC WITH DIFFERENTIAL/PLATELET
Basophils Absolute: 0.1 10*3/uL (ref 0–0.1)
Basophils Relative: 1 %
Eosinophils Absolute: 0 10*3/uL (ref 0–0.7)
Eosinophils Relative: 0 %
HCT: 42.6 % (ref 35.0–47.0)
HEMOGLOBIN: 14.1 g/dL (ref 12.0–16.0)
LYMPHS ABS: 0.8 10*3/uL — AB (ref 1.0–3.6)
LYMPHS PCT: 8 %
MCH: 27.4 pg (ref 26.0–34.0)
MCHC: 33 g/dL (ref 32.0–36.0)
MCV: 83 fL (ref 80.0–100.0)
Monocytes Absolute: 0.5 10*3/uL (ref 0.2–0.9)
Monocytes Relative: 5 %
NEUTROS ABS: 8.3 10*3/uL — AB (ref 1.4–6.5)
NEUTROS PCT: 86 %
Platelets: 256 10*3/uL (ref 150–440)
RBC: 5.14 MIL/uL (ref 3.80–5.20)
RDW: 14.8 % — ABNORMAL HIGH (ref 11.5–14.5)
WBC: 9.6 10*3/uL (ref 3.6–11.0)

## 2016-01-07 LAB — TROPONIN I

## 2016-01-07 NOTE — ED Notes (Signed)
Patient with no complaints at this time. Respirations even and unlabored. Skin warm/dry. Discharge instructions reviewed with patient at this time. Patient given opportunity to voice concerns/ask questions. Patient discharged at this time and left Emergency Department with steady gait.   

## 2016-01-07 NOTE — Discharge Instructions (Signed)
1. Take your blood pressure log to your doctor this week to discuss placing you back on your original blood pressure medicine. 2. Return to the ER for worsening symptoms, persistent vomiting, difficulty breathing or other concerns.  Hypertension Hypertension, commonly called high blood pressure, is when the force of blood pumping through your arteries is too strong. Your arteries are the blood vessels that carry blood from your heart throughout your body. A blood pressure reading consists of a higher number over a lower number, such as 110/72. The higher number (systolic) is the pressure inside your arteries when your heart pumps. The lower number (diastolic) is the pressure inside your arteries when your heart relaxes. Ideally you want your blood pressure below 120/80. Hypertension forces your heart to work harder to pump blood. Your arteries may become narrow or stiff. Having untreated or uncontrolled hypertension can cause heart attack, stroke, kidney disease, and other problems. RISK FACTORS Some risk factors for high blood pressure are controllable. Others are not.  Risk factors you cannot control include:   Race. You may be at higher risk if you are African American.  Age. Risk increases with age.  Gender. Men are at higher risk than women before age 62 years. After age 62, women are at higher risk than men. Risk factors you can control include:  Not getting enough exercise or physical activity.  Being overweight.  Getting too much fat, sugar, calories, or salt in your diet.  Drinking too much alcohol. SIGNS AND SYMPTOMS Hypertension does not usually cause signs or symptoms. Extremely high blood pressure (hypertensive crisis) may cause headache, anxiety, shortness of breath, and nosebleed. DIAGNOSIS To check if you have hypertension, your health care provider will measure your blood pressure while you are seated, with your arm held at the level of your heart. It should be measured at  least twice using the same arm. Certain conditions can cause a difference in blood pressure between your right and left arms. A blood pressure reading that is higher than normal on one occasion does not mean that you need treatment. If it is not clear whether you have high blood pressure, you may be asked to return on a different day to have your blood pressure checked again. Or, you may be asked to monitor your blood pressure at home for 1 or more weeks. TREATMENT Treating high blood pressure includes making lifestyle changes and possibly taking medicine. Living a healthy lifestyle can help lower high blood pressure. You may need to change some of your habits. Lifestyle changes may include:  Following the DASH diet. This diet is high in fruits, vegetables, and whole grains. It is low in salt, red meat, and added sugars.  Keep your sodium intake below 2,300 mg per day.  Getting at least 30-45 minutes of aerobic exercise at least 4 times per week.  Losing weight if necessary.  Not smoking.  Limiting alcoholic beverages.  Learning ways to reduce stress. Your health care provider may prescribe medicine if lifestyle changes are not enough to get your blood pressure under control, and if one of the following is true:  You are 4518-159 years of age and your systolic blood pressure is above 140.  You are 62 years of age or older, and your systolic blood pressure is above 150.  Your diastolic blood pressure is above 90.  You have diabetes, and your systolic blood pressure is over 140 or your diastolic blood pressure is over 90.  You have kidney disease and  your blood pressure is above 140/90.  You have heart disease and your blood pressure is above 140/90. Your personal target blood pressure may vary depending on your medical conditions, your age, and other factors. HOME CARE INSTRUCTIONS  Have your blood pressure rechecked as directed by your health care provider.   Take medicines only as  directed by your health care provider. Follow the directions carefully. Blood pressure medicines must be taken as prescribed. The medicine does not work as well when you skip doses. Skipping doses also puts you at risk for problems.  Do not smoke.   Monitor your blood pressure at home as directed by your health care provider. SEEK MEDICAL CARE IF:   You think you are having a reaction to medicines taken.  You have recurrent headaches or feel dizzy.  You have swelling in your ankles.  You have trouble with your vision. SEEK IMMEDIATE MEDICAL CARE IF:  You develop a severe headache or confusion.  You have unusual weakness, numbness, or feel faint.  You have severe chest or abdominal pain.  You vomit repeatedly.  You have trouble breathing. MAKE SURE YOU:   Understand these instructions.  Will watch your condition.  Will get help right away if you are not doing well or get worse.   This information is not intended to replace advice given to you by your health care provider. Make sure you discuss any questions you have with your health care provider.   Document Released: 10/19/2005 Document Revised: 03/05/2015 Document Reviewed: 08/11/2013 Elsevier Interactive Patient Education [employer]

## 2016-01-07 NOTE — ED Provider Notes (Signed)
[facility] Emergency Department Provider Note  ____________________________________________  Time seen: Approximately 4:21 AM  I have reviewed the triage vital signs and the nursing notes.   HISTORY  Chief Complaint Hypertension    HPI Michele House is a 60 y.o. female who presents to the ED from home with a chief complaint of elevated blood pressure.Patient reports she had a visit with a new PCP 6 days ago who took her off lisinopril-HCTZ 10/12.5 mg and replaced it with lisinopril 40 mg daily. Since then, patient has noted elevated pressures. Baseline blood pressures 115/70s. Since that change, patient has noted BP in the 150s/80s range. Last night she took her blood pressure and it was >200. She did not take any medicines prior to arrival. Denies associated fever, chills, headache, neck pain, vision changes, chest pain, shortness of breath, abdominal pain, nausea, vomiting, diarrhea. Denies recent travel or trauma. Nothing made her symptoms better or worse.   Past Medical History  Diagnosis Date  . Hypertension   . Hyperlipidemia   . Thyroid disease   . Diabetes mellitus without complication (HCC)   . Acid reflux     Patient Active Problem List   Diagnosis Date Noted  . Chest discomfort 10/12/2013  . Smoker 10/12/2013  . Essential hypertension 10/12/2013  . Obesity 10/12/2013  . Diabetes mellitus type 2 in obese (HCC) 10/12/2013    Past Surgical History  Procedure Laterality Date  . Tumor removal      benign;stomach  . Total vaginal hysterectomy    . Appendectomy    . Breast biopsy Right     CORE W/CLIP - NEG  . Appendectomy      Current Outpatient Rx  Name  Route  Sig  Dispense  Refill  . Cholecalciferol (VITAMIN D PO)   Oral   Take by mouth daily.         Marland Kitchen glipiZIDE (GLUCOTROL XL) 2.5 MG 24 hr tablet   Oral   Take 2.5 mg by mouth daily with breakfast.         . levothyroxine (SYNTHROID, LEVOTHROID) 100 MCG tablet    Oral   Take 100 mcg by mouth daily before breakfast.         . lisinopril-hydrochlorothiazide (PRINZIDE,ZESTORETIC) 20-25 MG per tablet   Oral   Take 1 tablet by mouth daily.         Marland Kitchen LOVASTATIN PO   Oral   Take 20 mg by mouth at bedtime.          . meclizine (ANTIVERT) 25 MG tablet   Oral   Take 1 tablet (25 mg total) by mouth 3 (three) times daily as needed for dizziness or nausea.   20 tablet   0   . nitroGLYCERIN (NITROSTAT) 0.4 MG SL tablet   Sublingual   Place 1 tablet (0.4 mg total) under the tongue every 5 (five) minutes as needed for chest pain.   25 tablet   3   . ondansetron (ZOFRAN) 4 MG tablet   Oral   Take 1 tablet (4 mg total) by mouth every 8 (eight) hours as needed for nausea or vomiting.   10 tablet   0     Allergies Aspirin; Percocet; and Sulfa antibiotics  Family History  Problem Relation Age of Onset  . Heart attack Mother   . Hypertension Mother   . Breast cancer Sister 43  . Breast cancer Maternal Aunt     40'S  . Breast cancer Maternal Grandmother  Social History Social History  Substance Use Topics  . Smoking status: Former Smoker -- 0.00 packs/day for 30 years    Quit date: 12/16/2015  . Smokeless tobacco: Never Used  . Alcohol Use: No    Review of Systems  Constitutional: No fever/chills. Eyes: No visual changes. ENT: No sore throat. Cardiovascular: Denies chest pain. Respiratory: Denies shortness of breath. Gastrointestinal: No abdominal pain.  No nausea, no vomiting.  No diarrhea.  No constipation. Genitourinary: Negative for dysuria. Musculoskeletal: Negative for back pain. Skin: Negative for rash. Neurological: Negative for headaches, focal weakness or numbness.  10-point ROS otherwise negative.  ____________________________________________   PHYSICAL EXAM:  VITAL SIGNS: ED Triage Vitals  Enc Vitals Group     BP 01/06/16 2356 180/93 mmHg     Pulse Rate 01/06/16 2356 110     Resp 01/06/16 2356 18      Temp 01/06/16 2356 98.2 F (36.8 C)     Temp Source 01/06/16 2356 Oral     SpO2 01/06/16 2356 98 %     Weight 01/06/16 2356 182 lb (82.555 kg)     Height 01/06/16 2356 5\' 1"  (1.549 m)     Head Cir --      Peak Flow --      Pain Score --      Pain Loc --      Pain Edu? --      Excl. in GC? --     Constitutional: Alert and oriented. Well appearing and in no acute distress. Eyes: Conjunctivae are normal. PERRL. EOMI. Head: Atraumatic. Nose: No congestion/rhinnorhea. Mouth/Throat: Mucous membranes are moist.  Oropharynx non-erythematous. Neck: No stridor.  No carotid bruits. Cardiovascular: Normal rate, regular rhythm. Grossly normal heart sounds.  Good peripheral circulation. Respiratory: Normal respiratory effort.  No retractions. Lungs CTAB. Gastrointestinal: Soft and nontender. No distention. No abdominal bruits. No CVA tenderness. Musculoskeletal: No lower extremity tenderness nor edema.  No joint effusions. Neurologic:  Normal speech and language. No gross focal neurologic deficits are appreciated. No gait instability. Skin:  Skin is warm, dry and intact. No rash noted. Psychiatric: Mood and affect are normal. Speech and behavior are normal.  ____________________________________________   LABS (all labs ordered are listed, but only abnormal results are displayed)  Labs Reviewed  CBC WITH DIFFERENTIAL/PLATELET - Abnormal; Notable for the following:    RDW 14.8 (*)    Neutro Abs 8.3 (*)    Lymphs Abs 0.8 (*)    All other components within normal limits  COMPREHENSIVE METABOLIC PANEL - Abnormal; Notable for the following:    Sodium 146 (*)    All other components within normal limits  TROPONIN I   ____________________________________________  EKG  ED ECG REPORT I, SUNG,JADE J, the attending physician, personally viewed and interpreted this ECG.   Date: 01/07/2016  EKG Time: 0003  Rate: 102  Rhythm: sinus tachycardia  Axis: LAD  Intervals:left anterior  fascicular block  ST&T Change: Nonspecific  ____________________________________________  RADIOLOGY  None ____________________________________________   PROCEDURES  Procedure(s) performed: None  Critical Care performed: No  ____________________________________________   INITIAL IMPRESSION / ASSESSMENT AND PLAN / ED COURSE  Pertinent labs & imaging results that were available during my care of the patient were reviewed by me and considered in my medical decision making (see chart for details).  60 year old female with elevated blood pressure secondary to recent medication change. EKG and lab work unremarkable. There is no evidence of renal insufficiency on her blood work this morning. I advised  her to follow-up with her PCP this week to discuss placing her back on her original medications which was nicely controlling her blood pressure. Strict return precautions given. Patient and family member verbalize understanding and agree with plan of care. ____________________________________________   FINAL CLINICAL IMPRESSION(S) / ED DIAGNOSES  Final diagnoses:  Essential hypertension      Irean Hong, MD 01/07/16 [phone]

## 2016-07-17 IMAGING — CR DG HIP COMPLETE 2+V*L*
1 series · 2 of 2 positions shown · non-contrast
Comparison: None.

CLINICAL DATA: Left hip pain

EXAM:
LEFT HIP - COMPLETE 2+ VIEW

[Series 1: dxr hip left complete · 0.14mm/px · 2 of 2 slices shown]
[im 1/2]
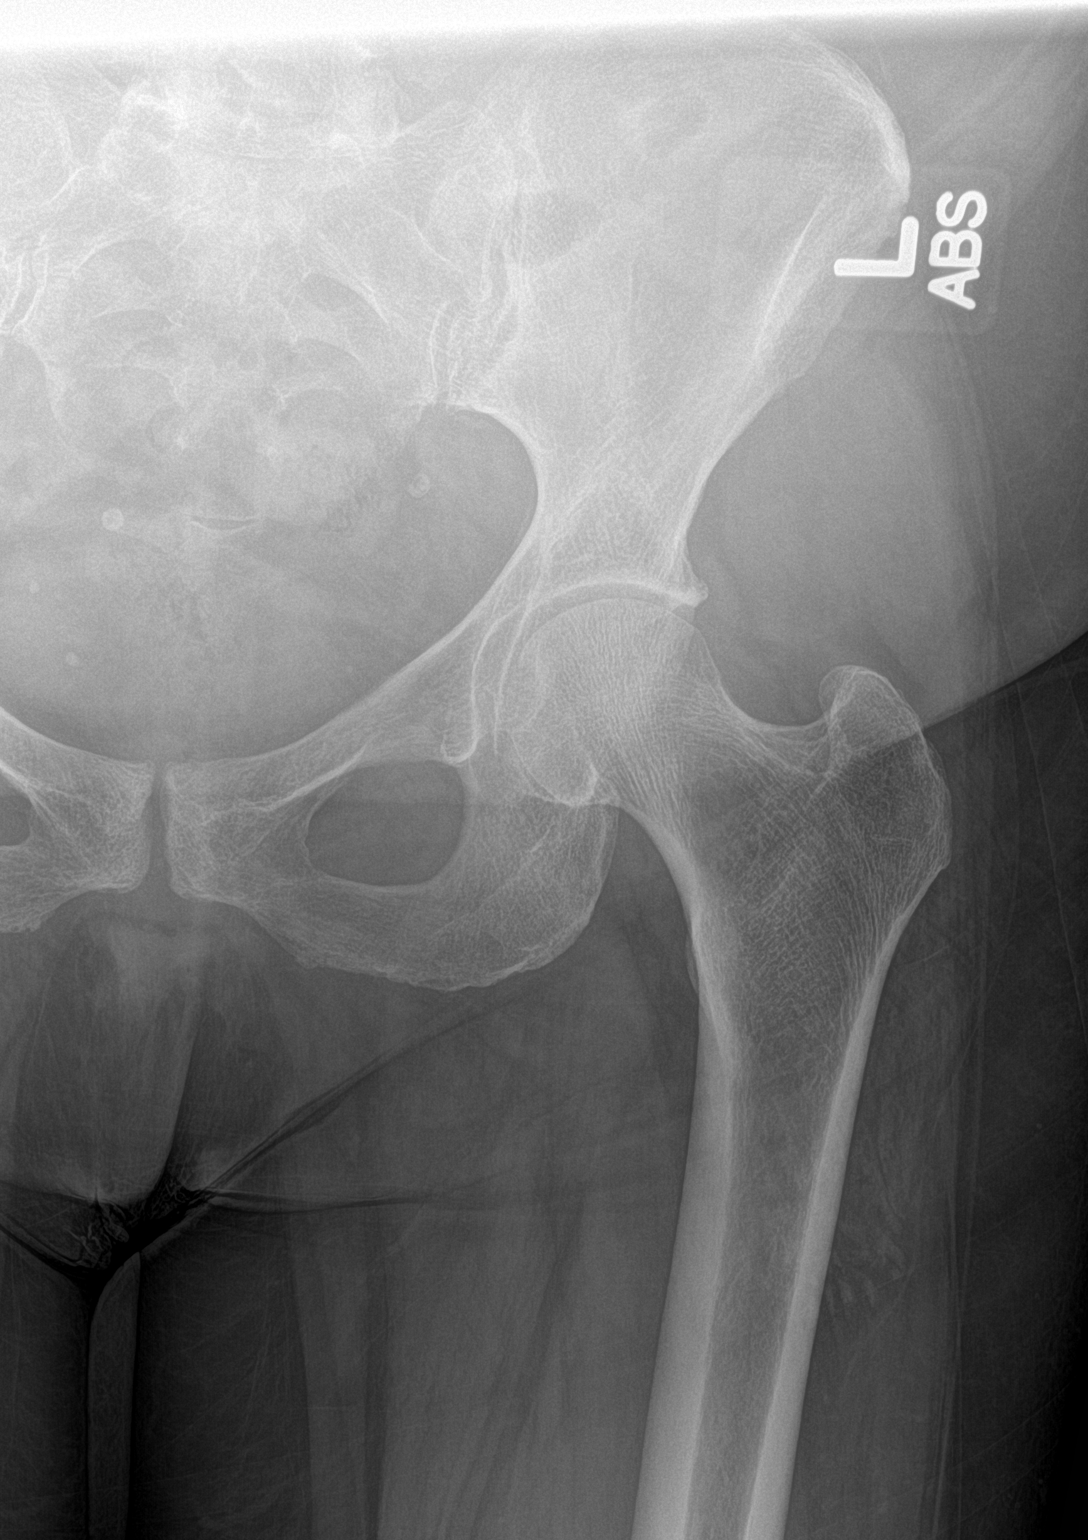
[im 2/2]
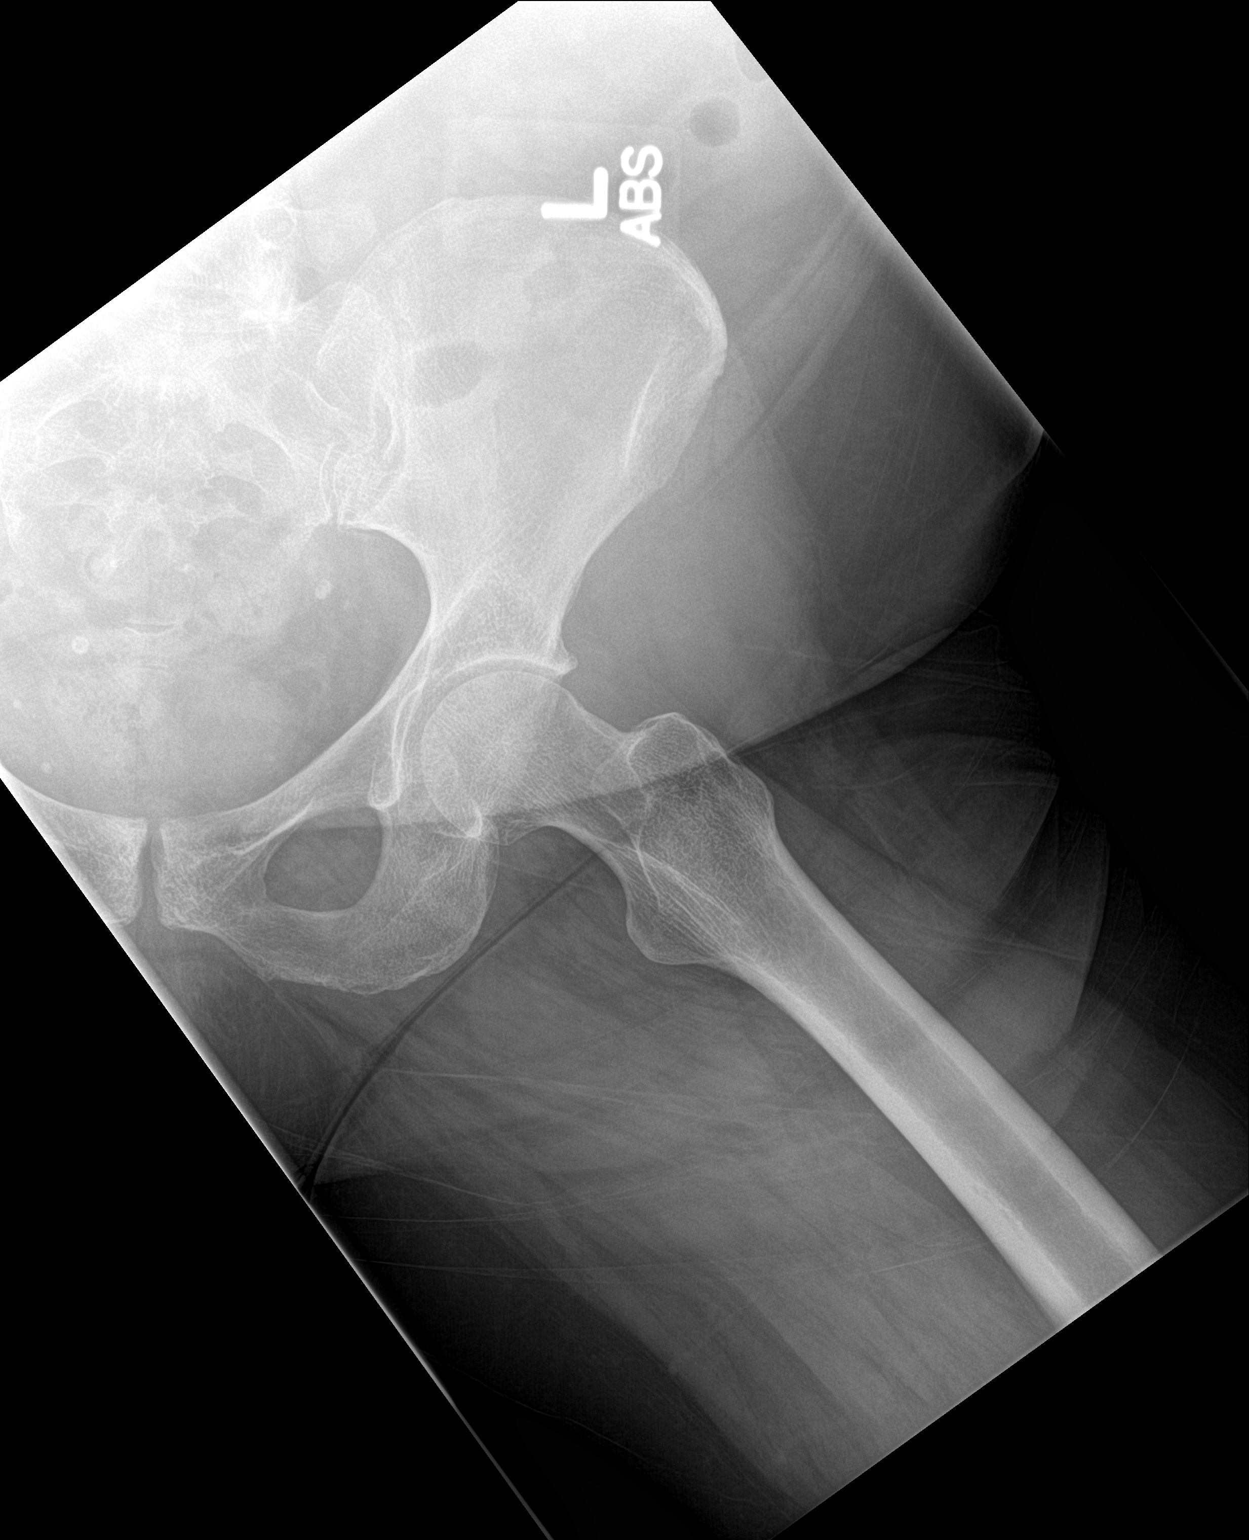

[2 of 2 positions shown; findings below may reference images not displayed]

FINDINGS: Mild degenerative changes are noted. No gross soft tissue
abnormality is seen. No acute fracture is noted.
IMPRESSION: Chronic changes without acute abnormality.

## 2017-08-19 ENCOUNTER — Emergency Department: Payer: Medicare HMO

## 2017-08-19 ENCOUNTER — Encounter: Payer: Self-pay | Admitting: Emergency Medicine

## 2017-08-19 ENCOUNTER — Emergency Department
Admission: EM | Admit: 2017-08-19 | Discharge: 2017-08-19 | Disposition: A | Discharge status: Home / Self Care | Payer: Medicare HMO | Attending: Emergency Medicine | Admitting: Emergency Medicine

## 2017-08-19 DIAGNOSIS — L03113 Cellulitis of right upper limb: Secondary | ICD-10-CM

## 2017-08-19 DIAGNOSIS — Z79899 Other long term (current) drug therapy: Secondary | ICD-10-CM | POA: Diagnosis not present

## 2017-08-19 DIAGNOSIS — Z87891 Personal history of nicotine dependence: Secondary | ICD-10-CM | POA: Diagnosis not present

## 2017-08-19 DIAGNOSIS — E119 Type 2 diabetes mellitus without complications: Secondary | ICD-10-CM | POA: Diagnosis not present

## 2017-08-19 DIAGNOSIS — Z7984 Long term (current) use of oral hypoglycemic drugs: Secondary | ICD-10-CM | POA: Insufficient documentation

## 2017-08-19 DIAGNOSIS — I10 Essential (primary) hypertension: Secondary | ICD-10-CM | POA: Insufficient documentation

## 2017-08-19 LAB — CBC
HEMATOCRIT: 42.9 % (ref 35.0–47.0)
Hemoglobin: 13.9 g/dL (ref 12.0–16.0)
MCH: 28.2 pg (ref 26.0–34.0)
MCHC: 32.3 g/dL (ref 32.0–36.0)
MCV: 87.4 fL (ref 80.0–100.0)
PLATELETS: 279 10*3/uL (ref 150–440)
RBC: 4.91 MIL/uL (ref 3.80–5.20)
RDW: 14.5 % (ref 11.5–14.5)
WBC: 12.3 10*3/uL — AB (ref 3.6–11.0)

## 2017-08-19 LAB — BASIC METABOLIC PANEL
ANION GAP: 13 (ref 5–15)
BUN: 14 mg/dL (ref 6–20)
CALCIUM: 9.2 mg/dL (ref 8.9–10.3)
CO2: 31 mmol/L (ref 22–32)
CREATININE: 0.75 mg/dL (ref 0.44–1.00)
Chloride: 96 mmol/L — ABNORMAL LOW (ref 101–111)
GFR calc non Af Amer: >60 mL/min (ref >60)
Glucose, Bld: 203 mg/dL — ABNORMAL HIGH (ref 65–99)
Potassium: 3.9 mmol/L (ref 3.5–5.1)
SODIUM: 140 mmol/L (ref 135–145)

## 2017-08-19 LAB — LACTIC ACID, PLASMA: Lactic Acid, Venous: 1.7 mmol/L (ref 0.5–1.9)

## 2017-08-19 MED ORDER — SODIUM CHLORIDE 0.9 % IV BOLUS (SEPSIS)
1000.0000 mL | Freq: Once | INTRAVENOUS | Status: AC
  Administered 2017-08-19: 1000 mL via INTRAVENOUS

## 2017-08-19 MED ORDER — AMPICILLIN-SULBACTAM SODIUM 3 (2-1) G IJ SOLR
3.0000 g | Freq: Once | INTRAMUSCULAR | Status: AC
  Administered 2017-08-19: 3 g via INTRAVENOUS
  Filled 2017-08-19 (×2): qty 3

## 2017-08-19 NOTE — Discharge Instructions (Signed)

## 2017-08-19 NOTE — ED Triage Notes (Signed)
Pt reports saw PCP today and diagnosed with cellulitis of right hand. Pt reports she was given prescription for hydrocodone and amoxicillin. Pt has taken pain medication but not started antibiotics. Pt states she came here for a second opinion to see if this is the right medication to take. Pt reports PCP marked redness and told pt to come to ER if redness moved out of marked line. No redness noted outside the marked line. Pt agrees the redness has not moved but wants a second opinion and says PCP told her she needed an xray.

## 2017-08-19 NOTE — ED Provider Notes (Signed)
[facility] Emergency Department Provider Note  ____________________________________________  Time seen: Approximately 5:47 PM  I have reviewed the triage vital signs and the nursing notes.   HISTORY  Chief Complaint Cellulitis   HPI Michele House is a 61 y.o. female with a history of diabetes, hypertension, hyperlipidemia who presents for evaluation of cellulitis of the right hand. Patient reports 3 days ago she was bitten by her cat who is fully vaccinated. Since then she has had redness and warmth surrounding the bites which are located in the dorsal aspect of her right hand. She also has had pain which has been moderate and constant and worse with movement of her fingers. No nausea, vomiting, chills, fever. She does not check her sugars at home so she is not sure if they've been elevated. She went to see her primary care doctor today who started her on Augmentin however she presents to the emergency room requesting a second opinion.  Past Medical History:  Diagnosis Date  . Acid reflux   . Diabetes mellitus without complication (HCC)   . Hyperlipidemia   . Hypertension   . Thyroid disease     Patient Active Problem List   Diagnosis Date Noted  . Chest discomfort 10/12/2013  . Smoker 10/12/2013  . Essential hypertension 10/12/2013  . Obesity 10/12/2013  . Diabetes mellitus type 2 in obese (HCC) 10/12/2013    Past Surgical History:  Procedure Laterality Date  . appendectomy    . APPENDECTOMY    . BREAST BIOPSY Right    CORE W/CLIP - NEG  . TOTAL VAGINAL HYSTERECTOMY    . TUMOR REMOVAL     benign;stomach    Prior to Admission medications   Medication Sig Start Date End Date Taking? Authorizing Provider  Cholecalciferol (VITAMIN D PO) Take by mouth daily.    [provider]  glipiZIDE (GLUCOTROL XL) 2.5 MG 24 hr tablet Take 2.5 mg by mouth daily with breakfast.    [provider]  levothyroxine (SYNTHROID, LEVOTHROID)  100 MCG tablet Take 100 mcg by mouth daily before breakfast.    [provider]  lisinopril-hydrochlorothiazide (PRINZIDE,ZESTORETIC) 20-25 MG per tablet Take 1 tablet by mouth daily.    [provider]  LOVASTATIN PO Take 20 mg by mouth at bedtime.     [provider]  meclizine (ANTIVERT) 25 MG tablet Take 1 tablet (25 mg total) by mouth 3 (three) times daily as needed for dizziness or nausea. 07/10/15   Governor RooksLord, Rebecca, MD  nitroGLYCERIN (NITROSTAT) 0.4 MG SL tablet Place 1 tablet (0.4 mg total) under the tongue every 5 (five) minutes as needed for chest pain. 10/12/13   Antonieta IbaGollan, Timothy J, MD  ondansetron (ZOFRAN) 4 MG tablet Take 1 tablet (4 mg total) by mouth every 8 (eight) hours as needed for nausea or vomiting. 07/10/15   Governor RooksLord, Rebecca, MD    Allergies Aspirin; Percocet [oxycodone-acetaminophen]; and Sulfa antibiotics  Family History  Problem Relation Age of Onset  . Heart attack Mother   . Hypertension Mother   . Breast cancer Sister 2559  . Breast cancer Maternal Aunt        40'S  . Breast cancer Maternal Grandmother     Social History Social History  Substance Use Topics  . Smoking status: Former Smoker    Packs/day: 0.00    Years: 30.00    Quit date: 12/16/2015  . Smokeless tobacco: Never Used  . Alcohol use No    Review of Systems  Constitutional: Negative for fever. Eyes: Negative for visual changes. ENT: Negative for sore throat. Neck: No neck pain  Cardiovascular: Negative for chest pain. Respiratory: Negative for shortness of breath. Gastrointestinal: Negative for abdominal pain, vomiting or diarrhea. Genitourinary: Negative for dysuria. Musculoskeletal: Negative for back pain. + R hand cat bite and infection Skin: Negative for rash. Neurological: Negative for headaches, weakness or numbness. Psych: No SI or HI  ____________________________________________   PHYSICAL EXAM:  VITAL SIGNS: ED Triage Vitals [08/19/17 1607]  Enc  Vitals Group     BP 136/72     Pulse Rate (!) 112     Resp 18     Temp 98.7 F (37.1 C)     Temp Source Oral     SpO2 96 %     Weight 195 lb (88.5 kg)     Height 5\' 1"  (1.549 m)     Head Circumference      Peak Flow      Pain Score 10     Pain Loc      Pain Edu?      Excl. in GC?     Constitutional: Alert and oriented. Well appearing and in no apparent distress. HEENT:      Head: Normocephalic and atraumatic.         Eyes: Conjunctivae are normal. Sclera is non-icteric.       Mouth/Throat: Mucous membranes are moist.       Neck: Supple with no signs of meningismus. Cardiovascular: Tachycardic with regular rate and rhythm. No murmurs, gallops, or rubs. 2+ symmetrical distal pulses are present in all extremities. No JVD. Respiratory: Normal respiratory effort. Lungs are clear to auscultation bilaterally. No wheezes, crackles, or rhonchi.  Musculoskeletal: There are two puncture wounds to the dorsum of the right hand with overlying warmth and erythema, no fluctuance. Neurologic: Normal speech and language. Face is symmetric. Moving all extremities. No gross focal neurologic deficits are appreciated. Skin: Skin is warm, dry and intact. No rash noted. Psychiatric: Mood and affect are normal. Speech and behavior are normal.        ____________________________________________   LABS (all labs ordered are listed, but only abnormal results are displayed)  Labs Reviewed  CBC - Abnormal; Notable for the following:       Result Value   WBC 12.3 (*)    All other components within normal limits  BASIC METABOLIC PANEL - Abnormal; Notable for the following:    Chloride 96 (*)    Glucose, Bld 203 (*)    All other components within normal limits  LACTIC ACID, PLASMA   ____________________________________________  EKG  none ____________________________________________  RADIOLOGY  XR hand:  Soft tissue swelling without acute osseous abnormalities or radiopaque foreign  body. ____________________________________________   PROCEDURES  Procedure(s) performed: None Procedures Critical Care performed:  None ____________________________________________   INITIAL IMPRESSION / ASSESSMENT AND PLAN / ED COURSE  60 y.o. female with a history of diabetes, hypertension, hyperlipidemia who presents for evaluation of cellulitis of the right hand after being bitten by her cat 3 days ago. No systemic symptoms. Patient afebrile but tachycardic with HR 112, labs showing leukocytosis with WBC 12.3. XR with no foreign bodies. Lactic acid pending. Will give a dose of Unasyn and IVF. Tetanus booster given today by PCP.     _________________________ 7:58 PM on 08/19/2017 -----------------------------------------  patient monitored for several hours in the emergency department and feels well. No fever, no spreading of the demarcated area. She received a dose  of IV Unasyn. She has her prescription for Augmentin that was given to her by her PCP today. X-ray with no foreign bodies. Lactate is normal. Recommended that she returns to the emergency room if the redness is spreading, if she develops a fever, nausea or vomiting, or if her pain is getting worse. Otherwise she is supposed to follow up with her primary care doctor in 24 hours for reevaluation.   As part of my medical decision making, I reviewed the following data within the electronic MEDICAL RECORD NUMBER Nursing notes reviewed and incorporated, Labs reviewed , Old chart reviewed, Radiograph reviewed , Notes from prior ED visits and [place] Controlled Substance Database    Pertinent labs & imaging results that were available during my care of the patient were reviewed by me and considered in my medical decision making (see chart for details).    ____________________________________________   FINAL CLINICAL IMPRESSION(S) / ED DIAGNOSES  Final diagnoses:  Cellulitis of right hand      NEW MEDICATIONS STARTED DURING THIS  VISIT:  New Prescriptions   No medications on file     Note:  This document was prepared using Dragon voice recognition software and may include unintentional dictation errors.    Nita Sickle, MD 08/19/17 2001

## 2017-08-19 NOTE — ED Notes (Signed)
IV antibiotic set up via pump as secondary infusion to fluids; upon reassessment, antibiotic did not run.  Pump reset to run antibiotic, delay explained to patient.

## 2019-06-03 ENCOUNTER — Emergency Department
Admission: EM | Admit: 2019-06-03 | Discharge: 2019-06-03 | Disposition: A | Discharge status: Home / Self Care | Payer: Medicare HMO | Attending: Emergency Medicine | Admitting: Emergency Medicine

## 2019-06-03 ENCOUNTER — Other Ambulatory Visit: Payer: Self-pay

## 2019-06-03 ENCOUNTER — Encounter: Payer: Self-pay | Admitting: Emergency Medicine

## 2019-06-03 DIAGNOSIS — M79604 Pain in right leg: Secondary | ICD-10-CM | POA: Diagnosis present

## 2019-06-03 DIAGNOSIS — I10 Essential (primary) hypertension: Secondary | ICD-10-CM | POA: Diagnosis not present

## 2019-06-03 DIAGNOSIS — G9009 Other idiopathic peripheral autonomic neuropathy: Secondary | ICD-10-CM | POA: Diagnosis not present

## 2019-06-03 DIAGNOSIS — Z79899 Other long term (current) drug therapy: Secondary | ICD-10-CM | POA: Insufficient documentation

## 2019-06-03 DIAGNOSIS — Z885 Allergy status to narcotic agent status: Secondary | ICD-10-CM | POA: Insufficient documentation

## 2019-06-03 DIAGNOSIS — Z882 Allergy status to sulfonamides status: Secondary | ICD-10-CM | POA: Diagnosis not present

## 2019-06-03 DIAGNOSIS — Z7984 Long term (current) use of oral hypoglycemic drugs: Secondary | ICD-10-CM | POA: Insufficient documentation

## 2019-06-03 DIAGNOSIS — J449 Chronic obstructive pulmonary disease, unspecified: Secondary | ICD-10-CM | POA: Insufficient documentation

## 2019-06-03 DIAGNOSIS — Z886 Allergy status to analgesic agent status: Secondary | ICD-10-CM | POA: Diagnosis not present

## 2019-06-03 DIAGNOSIS — M79605 Pain in left leg: Secondary | ICD-10-CM | POA: Diagnosis not present

## 2019-06-03 DIAGNOSIS — Z87891 Personal history of nicotine dependence: Secondary | ICD-10-CM | POA: Diagnosis not present

## 2019-06-03 DIAGNOSIS — E039 Hypothyroidism, unspecified: Secondary | ICD-10-CM | POA: Diagnosis not present

## 2019-06-03 DIAGNOSIS — G609 Hereditary and idiopathic neuropathy, unspecified: Secondary | ICD-10-CM

## 2019-06-03 DIAGNOSIS — E119 Type 2 diabetes mellitus without complications: Secondary | ICD-10-CM | POA: Diagnosis not present

## 2019-06-03 DIAGNOSIS — Z7982 Long term (current) use of aspirin: Secondary | ICD-10-CM | POA: Diagnosis not present

## 2019-06-03 HISTORY — DX: Chronic obstructive pulmonary disease, unspecified: J44.9

## 2019-06-03 MED ORDER — HYDROCODONE-ACETAMINOPHEN 5-325 MG PO TABS
1.0000 | ORAL_TABLET | ORAL | Status: AC
  Administered 2019-06-03: 1 via ORAL
  Filled 2019-06-03: qty 1

## 2019-06-03 MED ORDER — HYDROCODONE-ACETAMINOPHEN 5-325 MG PO TABS: 1.0000 | ORAL_TABLET | Freq: Four times a day (QID) | ORAL | 0 refills | Status: DC | PRN

## 2019-06-03 NOTE — Discharge Instructions (Addendum)
Please take Norco at nighttime as needed.  Continue with naproxen as needed.  Follow-up with PCP to discuss nerve pain in legs.  If any chest pain, shortness of breath, fevers leg swelling return to the emergency department immediately.

## 2019-06-03 NOTE — ED Notes (Signed)
See triage note. Pt states pain located in bilateral lower extremities, is equal in both legs and is accompanied by "hot feet". Pts primary goal is to address the pain so that she can sleep. Upon assessment, pt A&Ox4, NAD, no respiratory Sx evident.

## 2019-06-03 NOTE — ED Provider Notes (Signed)
[facility] EMERGENCY DEPARTMENT Provider Note   CSN: [id number] Arrival date & time: 06/03/19  2043     History   Chief Complaint Chief Complaint  Patient presents with  . Leg Pain    HPI Michele House is a 63 y.o. female presents to the emergency department for evaluation of bilateral leg pain.  She has a history of diabetes that is not well controlled.  She states for 3 months she has had burning numbness tingling that is worse at nighttime in both legs.  Symptoms are circumferential bilaterally.  She denies any swelling, edema, temperature change, fevers, chest pain, shortness of breath.  No history of blood clots.  PCP has been treating her with naproxen and Tylenol 3.  Naproxen helps minimally.  She has been unable to take gabapentin.  She has not been scheduled for nerve conduction studies to see neurologist.  She denies any recent trauma or injury.  She is only gotten relief with a hot tub prior to bedtime and Tylenol 3.     HPI  Past Medical History:  Diagnosis Date  . Acid reflux   . COPD (chronic obstructive pulmonary disease) (HCC)   . Diabetes mellitus without complication (HCC)   . Hyperlipidemia   . Hypertension   . Thyroid disease     Patient Active Problem List   Diagnosis Date Noted  . Chest discomfort 10/12/2013  . Smoker 10/12/2013  . Essential hypertension 10/12/2013  . Obesity 10/12/2013  . Diabetes mellitus type 2 in obese (HCC) 10/12/2013    Past Surgical History:  Procedure Laterality Date  . appendectomy    . APPENDECTOMY    . BREAST BIOPSY Right    CORE W/CLIP - NEG  . TOTAL VAGINAL HYSTERECTOMY    . TUMOR REMOVAL     benign;stomach     OB History   No obstetric history on file.      Home Medications    Prior to Admission medications   Medication Sig Start Date End Date Taking? Authorizing Provider  aspirin EC 81 MG tablet Take 81 mg by mouth daily.   Yes [provider]  b complex-C-folic acid  1 MG capsule Take 1 capsule by mouth daily.   Yes [provider]  cetirizine (ZYRTEC) 10 MG tablet Take 10 mg by mouth daily.   Yes [provider]  Cholecalciferol (VITAMIN D PO) Take by mouth daily.   Yes [provider]  cholecalciferol (VITAMIN D3) 25 MCG (1000 UT) tablet Take 1,000 Units by mouth daily.   Yes [provider]  diclofenac sodium (VOLTAREN) 1 % GEL Apply topically 4 (four) times daily.   Yes [provider]  glipiZIDE (GLUCOTROL XL) 2.5 MG 24 hr tablet Take 2.5 mg by mouth daily with breakfast.   Yes [provider]  ipratropium (ATROVENT HFA) 17 MCG/ACT inhaler Inhale 2 puffs into the lungs every 6 (six) hours.   Yes [provider]  levothyroxine (SYNTHROID, LEVOTHROID) 100 MCG tablet Take 100 mcg by mouth daily before breakfast.   Yes [provider]  lisinopril-hydrochlorothiazide (PRINZIDE,ZESTORETIC) 20-25 MG per tablet Take 1 tablet by mouth daily.   Yes [provider]  mometasone (ASMANEX) 220 MCG/INH inhaler Inhale 2 puffs into the lungs daily.   Yes [provider]  Multiple Vitamin (MULTIVITAMIN) capsule Take 1 capsule by mouth daily.   Yes [provider]  naproxen (NAPROSYN) 500 MG tablet Take 500 mg by mouth 2 (two) times daily as  needed.   Yes [provider]  nitroGLYCERIN (NITROSTAT) 0.4 MG SL tablet Place 1 tablet (0.4 mg total) under the tongue every 5 (five) minutes as needed for chest pain. 10/12/13  Yes Antonieta IbaGollan, Timothy J, MD  sertraline (ZOLOFT) 50 MG tablet Take 50 mg by mouth daily.   Yes [provider]  HYDROcodone-acetaminophen (NORCO) 5-325 MG tablet Take 1 tablet by mouth every 6 (six) hours as needed for moderate pain. 06/03/19   Evon SlackGaines, Tytus Strahle C, PA-C    Family History Family History  Problem Relation Age of Onset  . Heart attack Mother   . Hypertension Mother   . Breast cancer Sister [id number]  . Breast cancer Maternal Aunt         40'S  . Breast cancer Maternal Grandmother     Social History Social History   Tobacco Use  . Smoking status: Former Smoker    Packs/day: 0.00    Years: 30.00    Pack years: 0.00    Quit date: 12/16/2015    Years since quitting: 3.4  . Smokeless tobacco: Never Used  Substance Use Topics  . Alcohol use: No  . Drug use: No     Allergies   Aspirin, Percocet [oxycodone-acetaminophen], and Sulfa antibiotics   Review of Systems Review of Systems  Constitutional: Negative for activity change.  Eyes: Negative for pain and visual disturbance.  Respiratory: Negative for shortness of breath.   Cardiovascular: Negative for chest pain and leg swelling.  Gastrointestinal: Negative for abdominal pain.  Genitourinary: Negative for flank pain and pelvic pain.  Musculoskeletal: Negative for arthralgias, gait problem, joint swelling, myalgias, neck pain and neck stiffness.  Skin: Negative for rash and wound.  Neurological: Positive for numbness. Negative for dizziness, syncope, weakness, light-headedness and headaches.  Psychiatric/Behavioral: Negative for confusion and decreased concentration.     Physical Exam Updated Vital Signs BP (!) 165/76 (BP Location: Left Arm)   Pulse (!) 105   Temp 98.5 F (36.9 C)   Resp 20   Ht 5\' 1"  (1.549 m)   Wt 81.6 kg   LMP 12/24/1998 Comment: prior to her hysterectomy  SpO2 94%   BMI 34.01 kg/m   Physical Exam Constitutional:      Appearance: She is well-developed.  HENT:     Head: Normocephalic and atraumatic.  Eyes:     Conjunctiva/sclera: Conjunctivae normal.  Neck:     Musculoskeletal: Normal range of motion.  Cardiovascular:     Rate and Rhythm: Normal rate.  Pulmonary:     Effort: Pulmonary effort is normal. No respiratory distress.  Musculoskeletal: Normal range of motion.     Comments: Patient ambulatory no assistive devices.  Both lower extremities show no swelling warmth erythema or edema.  Both legs are same circumference  size.  Sensation is intact.  2+ dorsalis pedis pulses bilaterally.  Good warmth in both lower extremities.  She has negative Homans sign bilaterally.  She is able to straight leg raise bilaterally.  Compartments are soft.  Skin:    General: Skin is warm.     Findings: No rash.  Neurological:     Mental Status: She is alert and oriented to person, place, and time.  Psychiatric:        Behavior: Behavior normal.        Thought Content: Thought content normal.      ED Treatments / Results  Labs (all labs ordered are listed, but only abnormal results are displayed) Labs Reviewed - No data to  display  EKG None  Radiology No results found.  Procedures Procedures (including critical care time)  Medications Ordered in ED Medications  HYDROcodone-acetaminophen (NORCO/VICODIN) 5-325 MG per tablet 1 tablet (has no administration in time range)     Initial Impression / Assessment and Plan / ED Course  I have reviewed the triage vital signs and the nursing notes.  Pertinent labs & imaging results that were available during my care of the patient were reviewed by me and considered in my medical decision making (see chart for details).        63 year old female with bilateral lower extremity nighttime numbness tingling and burning.  She has a history of diabetes.  Symptoms seem to be consistent with neuropathy.  She has no lower extremity swelling or edema, no signs of DVT or compartment syndrome.  She is nervous intact in bilateral lower extremities.  Patient given a prescription for Norco to take at nighttime as needed for pain.  She will follow-up with PCP.  Final Clinical Impressions(s) / ED Diagnoses   Final diagnoses:  Idiopathic peripheral neuropathy  Bilateral leg pain    ED Discharge Orders         Ordered    HYDROcodone-acetaminophen (NORCO) 5-325 MG tablet  Every 6 hours PRN     06/03/19 2154           Duanne Guess, PA-C 06/03/19 2200    Duffy Bruce, MD 06/09/19 [phone]

## 2019-06-03 NOTE — ED Triage Notes (Addendum)
Pt reports bilateral leg pain, from below knees to feet, for 3 months; says she's here tonight because she's having trouble sleeping; pt says her doctors haven't told her what's wrong with her legs but have put her on diclofenac topical gel daily and naproxen bid prn; was taking muscle relaxer as well with no relief; pt says the only medication they have put her on that helped was Tylenol ##3; hot soaks in tubs help some; pt in no acute distress; pt thinks she has neuropathy; was taken off Lovastatin mid July because her provider thought that might be causing her leg pain; symptoms have not improved;

## 2019-06-05 ENCOUNTER — Other Ambulatory Visit: Payer: Self-pay | Admitting: Family Medicine

## 2019-06-05 DIAGNOSIS — Z13820 Encounter for screening for osteoporosis: Secondary | ICD-10-CM

## 2020-09-20 ENCOUNTER — Other Ambulatory Visit: Payer: Self-pay | Admitting: Family Medicine

## 2020-09-20 DIAGNOSIS — Z1231 Encounter for screening mammogram for malignant neoplasm of breast: Secondary | ICD-10-CM

## 2020-09-20 DIAGNOSIS — Z0000 Encounter for general adult medical examination without abnormal findings: Secondary | ICD-10-CM

## 2022-10-06 ENCOUNTER — Other Ambulatory Visit: Payer: Self-pay | Admitting: Family Medicine

## 2022-10-06 DIAGNOSIS — Z1231 Encounter for screening mammogram for malignant neoplasm of breast: Secondary | ICD-10-CM

## 2023-06-07 ENCOUNTER — Encounter: Payer: Self-pay | Admitting: Emergency Medicine

## 2023-06-07 ENCOUNTER — Emergency Department: Payer: 59

## 2023-06-07 ENCOUNTER — Other Ambulatory Visit: Payer: Self-pay

## 2023-06-07 ENCOUNTER — Observation Stay
Admission: EM | Admit: 2023-06-07 | Discharge: 2023-06-08 | Disposition: A | Discharge status: Home / Self Care | Payer: 59 | Attending: Internal Medicine | Admitting: Internal Medicine

## 2023-06-07 DIAGNOSIS — E119 Type 2 diabetes mellitus without complications: Secondary | ICD-10-CM | POA: Diagnosis not present

## 2023-06-07 DIAGNOSIS — J441 Chronic obstructive pulmonary disease with (acute) exacerbation: Secondary | ICD-10-CM | POA: Diagnosis not present

## 2023-06-07 DIAGNOSIS — Z1152 Encounter for screening for COVID-19: Secondary | ICD-10-CM | POA: Diagnosis not present

## 2023-06-07 DIAGNOSIS — Z716 Tobacco abuse counseling: Secondary | ICD-10-CM

## 2023-06-07 DIAGNOSIS — R911 Solitary pulmonary nodule: Secondary | ICD-10-CM | POA: Diagnosis not present

## 2023-06-07 DIAGNOSIS — I10 Essential (primary) hypertension: Secondary | ICD-10-CM | POA: Diagnosis present

## 2023-06-07 DIAGNOSIS — Z79899 Other long term (current) drug therapy: Secondary | ICD-10-CM | POA: Diagnosis not present

## 2023-06-07 DIAGNOSIS — E1169 Type 2 diabetes mellitus with other specified complication: Secondary | ICD-10-CM | POA: Diagnosis not present

## 2023-06-07 DIAGNOSIS — E039 Hypothyroidism, unspecified: Secondary | ICD-10-CM | POA: Insufficient documentation

## 2023-06-07 DIAGNOSIS — Z7982 Long term (current) use of aspirin: Secondary | ICD-10-CM | POA: Insufficient documentation

## 2023-06-07 DIAGNOSIS — F17200 Nicotine dependence, unspecified, uncomplicated: Secondary | ICD-10-CM | POA: Diagnosis present

## 2023-06-07 DIAGNOSIS — T7840XA Allergy, unspecified, initial encounter: Secondary | ICD-10-CM | POA: Insufficient documentation

## 2023-06-07 DIAGNOSIS — E669 Obesity, unspecified: Secondary | ICD-10-CM | POA: Diagnosis present

## 2023-06-07 DIAGNOSIS — Z7984 Long term (current) use of oral hypoglycemic drugs: Secondary | ICD-10-CM | POA: Insufficient documentation

## 2023-06-07 DIAGNOSIS — J9601 Acute respiratory failure with hypoxia: Principal | ICD-10-CM | POA: Insufficient documentation

## 2023-06-07 DIAGNOSIS — Z87891 Personal history of nicotine dependence: Secondary | ICD-10-CM | POA: Insufficient documentation

## 2023-06-07 DIAGNOSIS — E785 Hyperlipidemia, unspecified: Secondary | ICD-10-CM

## 2023-06-07 DIAGNOSIS — J9621 Acute and chronic respiratory failure with hypoxia: Secondary | ICD-10-CM | POA: Diagnosis present

## 2023-06-07 LAB — CBC WITH DIFFERENTIAL/PLATELET
Abs Immature Granulocytes: 0.03 10*3/uL (ref 0.00–0.07)
Basophils Absolute: 0 10*3/uL (ref 0.0–0.1)
Basophils Relative: 0 %
Eosinophils Absolute: 0 10*3/uL (ref 0.0–0.5)
Eosinophils Relative: 0 %
HCT: 45.9 % (ref 36.0–46.0)
Hemoglobin: 14.8 g/dL (ref 12.0–15.0)
Immature Granulocytes: 0 %
Lymphocytes Relative: 16 %
Lymphs Abs: 1.2 10*3/uL (ref 0.7–4.0)
MCH: 29.1 pg (ref 26.0–34.0)
MCHC: 32.2 g/dL (ref 30.0–36.0)
MCV: 90.2 fL (ref 80.0–100.0)
Monocytes Absolute: 0.5 10*3/uL (ref 0.1–1.0)
Monocytes Relative: 7 %
Neutro Abs: 5.5 10*3/uL (ref 1.7–7.7)
Neutrophils Relative %: 77 %
Platelets: 193 10*3/uL (ref 150–400)
RBC: 5.09 MIL/uL (ref 3.87–5.11)
RDW: 12.8 % (ref 11.5–15.5)
WBC: 7.3 10*3/uL (ref 4.0–10.5)
nRBC: 0 % (ref 0.0–0.2)

## 2023-06-07 LAB — COMPREHENSIVE METABOLIC PANEL
ALT: 11 U/L (ref 0–44)
AST: 17 U/L (ref 15–41)
Albumin: 4.2 g/dL (ref 3.5–5.0)
Alkaline Phosphatase: 93 U/L (ref 38–126)
Anion gap: 10 (ref 5–15)
BUN: 21 mg/dL (ref 8–23)
CO2: 29 mmol/L (ref 22–32)
Calcium: 9.7 mg/dL (ref 8.9–10.3)
Chloride: 102 mmol/L (ref 98–111)
Creatinine, Ser: 0.89 mg/dL (ref 0.44–1.00)
GFR, Estimated: >60 mL/min (ref >60)
Glucose, Bld: 176 mg/dL — ABNORMAL HIGH (ref 70–99)
Potassium: 3.9 mmol/L (ref 3.5–5.1)
Sodium: 141 mmol/L (ref 135–145)
Total Bilirubin: 0.3 mg/dL (ref 0.3–1.2)
Total Protein: 7.1 g/dL (ref 6.5–8.1)

## 2023-06-07 LAB — TROPONIN I (HIGH SENSITIVITY)
Troponin I (High Sensitivity): 8 ng/L (ref <18)
Troponin I (High Sensitivity): 7 ng/L (ref <18)

## 2023-06-07 LAB — HEMOGLOBIN A1C
Hgb A1c MFr Bld: 7.1 % — ABNORMAL HIGH (ref 4.8–5.6)
Mean Plasma Glucose: 157.07 mg/dL

## 2023-06-07 LAB — GLUCOSE, CAPILLARY: Glucose-Capillary: 361 mg/dL — ABNORMAL HIGH (ref 70–99)

## 2023-06-07 LAB — TSH: TSH: 0.306 u[IU]/mL — ABNORMAL LOW (ref 0.350–4.500)

## 2023-06-07 LAB — SARS CORONAVIRUS 2 BY RT PCR: SARS Coronavirus 2 by RT PCR: NEGATIVE

## 2023-06-07 MED ORDER — VITAMIN D 25 MCG (1000 UNIT) PO TABS
1000.0000 [IU] | ORAL_TABLET | Freq: Every day | ORAL | Status: DC
  Administered 2023-06-08: 1000 [IU] via ORAL
  Filled 2023-06-07: qty 1

## 2023-06-07 MED ORDER — METHYLPREDNISOLONE SODIUM SUCC 40 MG IJ SOLR
40.0000 mg | INTRAMUSCULAR | Status: AC
  Administered 2023-06-08: 40 mg via INTRAVENOUS
  Filled 2023-06-07: qty 1

## 2023-06-07 MED ORDER — AZITHROMYCIN 500 MG PO TABS: 500.0000 mg | ORAL_TABLET | Freq: Every day | ORAL | Status: DC

## 2023-06-07 MED ORDER — ACETAMINOPHEN 325 MG PO TABS
650.0000 mg | ORAL_TABLET | Freq: Four times a day (QID) | ORAL | Status: DC | PRN
  Administered 2023-06-07: 650 mg via ORAL
  Filled 2023-06-07: qty 2

## 2023-06-07 MED ORDER — INSULIN ASPART 100 UNIT/ML IJ SOLN
4.0000 [IU] | Freq: Three times a day (TID) | INTRAMUSCULAR | Status: DC
  Administered 2023-06-08 (×2): 4 [IU] via SUBCUTANEOUS
  Filled 2023-06-07: qty 1

## 2023-06-07 MED ORDER — ENOXAPARIN SODIUM 40 MG/0.4ML IJ SOSY
40.0000 mg | PREFILLED_SYRINGE | INTRAMUSCULAR | Status: DC
  Administered 2023-06-07: 40 mg via SUBCUTANEOUS
  Filled 2023-06-07: qty 0.4

## 2023-06-07 MED ORDER — IPRATROPIUM-ALBUTEROL 0.5-2.5 (3) MG/3ML IN SOLN
3.0000 mL | Freq: Four times a day (QID) | RESPIRATORY_TRACT | Status: DC
  Filled 2023-06-07: qty 3

## 2023-06-07 MED ORDER — INSULIN ASPART 100 UNIT/ML IJ SOLN
0.0000 [IU] | Freq: Three times a day (TID) | INTRAMUSCULAR | Status: DC
  Administered 2023-06-08: 3 [IU] via SUBCUTANEOUS
  Administered 2023-06-08: 5 [IU] via SUBCUTANEOUS
  Filled 2023-06-07 (×2): qty 1

## 2023-06-07 MED ORDER — ASPIRIN 81 MG PO TBEC
81.0000 mg | DELAYED_RELEASE_TABLET | Freq: Every day | ORAL | Status: DC
  Administered 2023-06-08: 81 mg via ORAL
  Filled 2023-06-07: qty 1

## 2023-06-07 MED ORDER — IPRATROPIUM-ALBUTEROL 0.5-2.5 (3) MG/3ML IN SOLN
3.0000 mL | Freq: Once | RESPIRATORY_TRACT | Status: AC
  Administered 2023-06-07: 3 mL via RESPIRATORY_TRACT
  Filled 2023-06-07: qty 3

## 2023-06-07 MED ORDER — ENSURE ENLIVE PO LIQD
237.0000 mL | Freq: Two times a day (BID) | ORAL | Status: DC
  Administered 2023-06-08 (×2): 237 mL via ORAL

## 2023-06-07 MED ORDER — SERTRALINE HCL 50 MG PO TABS
50.0000 mg | ORAL_TABLET | Freq: Every day | ORAL | Status: DC
  Administered 2023-06-08: 50 mg via ORAL
  Filled 2023-06-07: qty 1

## 2023-06-07 MED ORDER — ALBUTEROL SULFATE (2.5 MG/3ML) 0.083% IN NEBU: 2.5000 mg | INHALATION_SOLUTION | RESPIRATORY_TRACT | Status: DC | PRN

## 2023-06-07 MED ORDER — PREDNISONE 20 MG PO TABS: 40.0000 mg | ORAL_TABLET | Freq: Every day | ORAL | Status: DC

## 2023-06-07 MED ORDER — DIPHENHYDRAMINE HCL 25 MG PO CAPS: 25.0000 mg | ORAL_CAPSULE | Freq: Four times a day (QID) | ORAL | Status: DC | PRN

## 2023-06-07 MED ORDER — METHYLPREDNISOLONE SODIUM SUCC 125 MG IJ SOLR
125.0000 mg | Freq: Once | INTRAMUSCULAR | Status: AC
  Administered 2023-06-07: 125 mg via INTRAVENOUS
  Filled 2023-06-07: qty 2

## 2023-06-07 MED ORDER — ONDANSETRON HCL 4 MG/2ML IJ SOLN: 4.0000 mg | Freq: Four times a day (QID) | INTRAMUSCULAR | Status: DC | PRN

## 2023-06-07 MED ORDER — NICOTINE 7 MG/24HR TD PT24
7.0000 mg | MEDICATED_PATCH | Freq: Every day | TRANSDERMAL | Status: DC
  Filled 2023-06-07 (×2): qty 1

## 2023-06-07 MED ORDER — LISINOPRIL 20 MG PO TABS
20.0000 mg | ORAL_TABLET | Freq: Every day | ORAL | Status: DC
  Administered 2023-06-08: 20 mg via ORAL
  Filled 2023-06-07: qty 1

## 2023-06-07 MED ORDER — LEVOTHYROXINE SODIUM 100 MCG PO TABS
100.0000 ug | ORAL_TABLET | Freq: Every day | ORAL | Status: DC
  Administered 2023-06-08: 100 ug via ORAL
  Filled 2023-06-07: qty 1

## 2023-06-07 MED ORDER — IOHEXOL 350 MG/ML SOLN
75.0000 mL | Freq: Once | INTRAVENOUS | Status: AC | PRN
  Administered 2023-06-07: 75 mL via INTRAVENOUS

## 2023-06-07 MED ORDER — ONDANSETRON HCL 4 MG PO TABS: 4.0000 mg | ORAL_TABLET | Freq: Four times a day (QID) | ORAL | Status: DC | PRN

## 2023-06-07 MED ORDER — IPRATROPIUM-ALBUTEROL 0.5-2.5 (3) MG/3ML IN SOLN
3.0000 mL | Freq: Four times a day (QID) | RESPIRATORY_TRACT | Status: DC
  Administered 2023-06-07: 3 mL via RESPIRATORY_TRACT
  Filled 2023-06-07: qty 3

## 2023-06-07 MED ORDER — ACETAMINOPHEN 650 MG RE SUPP: 650.0000 mg | Freq: Four times a day (QID) | RECTAL | Status: DC | PRN

## 2023-06-07 MED ORDER — INSULIN ASPART 100 UNIT/ML IJ SOLN
0.0000 [IU] | Freq: Every day | INTRAMUSCULAR | Status: DC
  Administered 2023-06-07: 5 [IU] via SUBCUTANEOUS
  Filled 2023-06-07: qty 1

## 2023-06-07 MED ORDER — HYDROCHLOROTHIAZIDE 25 MG PO TABS
25.0000 mg | ORAL_TABLET | Freq: Every day | ORAL | Status: DC
  Administered 2023-06-08: 25 mg via ORAL
  Filled 2023-06-07: qty 1

## 2023-06-07 MED ORDER — LISINOPRIL-HYDROCHLOROTHIAZIDE 20-25 MG PO TABS: 1.0000 | ORAL_TABLET | Freq: Every day | ORAL | Status: DC

## 2023-06-07 MED ORDER — SODIUM CHLORIDE 0.9 % IV SOLN
500.0000 mg | INTRAVENOUS | Status: AC
  Administered 2023-06-07: 500 mg via INTRAVENOUS
  Filled 2023-06-07: qty 5

## 2023-06-07 NOTE — Assessment & Plan Note (Signed)
Blood sugar in 170s  SSI  Monitor blood sugars w/ steroid use

## 2023-06-07 NOTE — Assessment & Plan Note (Addendum)
Decompensated respiratory status with new O2 requirement of 2 L in the setting of COPD exacerbation Positive cough, wheezing, increased sputum production IV Solu-Medrol, azithromycin, DuoNebs Continue supplemental O2  Follow closely

## 2023-06-07 NOTE — Assessment & Plan Note (Signed)
BP stable Titrate home regimen 

## 2023-06-07 NOTE — Assessment & Plan Note (Signed)
1/4 PPD smoker  Discussed cessation  Nicotine patch

## 2023-06-07 NOTE — H&P (Addendum)
History and Physical    Patient: Michele House QMV:[id number] DOB: 31-Dec-1955 DOA: 06/07/2023 DOS: the patient was seen and examined on 06/07/2023 [facility]CP: [facility]  Patient coming from: Home  Chief Complaint:  Chief Complaint  Patient presents with   Allergic Reaction   HPI: Michele House is a 67 y.o. female with medical history significant of obesity, COPD, type 2 diabetes, hyperlipidemia, hypertension, hypothyroidism presenting with acute respiratory failure hypoxia, COPD exacerbation, allergic reaction.  Patient reports increased work of breathing, cough, wheezing over several weeks.  Formal diagnosis of COPD roughly 1 year ago.  On home inhalers.  No prior oxygen use.  Patient reports being seen by her PCP within the past 1 to 2 weeks.  Placed on a course of Augmentin for presumed?  Ear infection.  Worsening generalized rash and itching over the past 2 to 3 days.  No chest pain or shortness of breath.  No nausea or vomiting.  Still smoking 1/4 pack/day.  No reported alcohol use. Presented to the ER afebrile, MD stable.  Satting in the mid 80s on room air.  Transition to 2 L nasal cannula to keep O2 sats greater 94%.  CBC and CMP grossly within normal limits apart from a glucose of 176.  CT of the chest negative for PE.  Does show solid pulmonary nodule in the right upper lobe measuring 1.3 cm.  Also with numerous small solid groundglass pulmonary nodule seen in the right hemithorax.  No enlarged lymph nodes.  Positive emphysema Review of Systems: As mentioned in the history of present illness. All other systems reviewed and are negative. Past Medical History:  Diagnosis Date   Acid reflux    COPD (chronic obstructive pulmonary disease) (HCC)    Diabetes mellitus without complication (HCC)    Hyperlipidemia    Hypertension    Thyroid disease    Past Surgical History:  Procedure Laterality Date   appendectomy     APPENDECTOMY     BREAST BIOPSY Right    CORE  W/CLIP - NEG   TOTAL VAGINAL HYSTERECTOMY     TUMOR REMOVAL     benign;stomach   Social History:  reports that she quit smoking about 37 years ago. Her smoking use included cigarettes. She has never used smokeless tobacco. She reports that she does not drink alcohol and does not use drugs.  Allergies  Allergen Reactions   Aspirin Other (See Comments)    Lips numb   Percocet [Oxycodone-Acetaminophen] Other (See Comments)    "makes me cry"   Sulfa Antibiotics     Family History  Problem Relation Age of Onset   Heart attack Mother    Hypertension Mother    Breast cancer Sister 104   Breast cancer Maternal Aunt        40'S   Breast cancer Maternal Grandmother     Prior to Admission medications   Medication Sig Start Date End Date Taking? Authorizing Provider  aspirin EC 81 MG tablet Take 81 mg by mouth daily.   Yes [provider]  b complex-C-folic acid 1 MG capsule Take 1 capsule by mouth daily.   Yes [provider]  cetirizine (ZYRTEC) 10 MG tablet Take 10 mg by mouth daily.   Yes [provider]  Cholecalciferol (VITAMIN D PO) Take by mouth daily.   Yes [provider]  cholecalciferol (VITAMIN D3) 25 MCG (1000 UT) tablet Take 1,000 Units by mouth daily.   Yes [provider]  diclofenac sodium (VOLTAREN) 1 % GEL Apply topically 4 (four) times daily.   Yes [provider]  glipiZIDE (GLUCOTROL XL) 2.5 MG 24 hr tablet Take 2.5 mg by mouth daily with breakfast.   Yes [provider]  ipratropium (ATROVENT HFA) 17 MCG/ACT inhaler Inhale 2 puffs into the lungs every 6 (six) hours.   Yes [provider]  levothyroxine (SYNTHROID, LEVOTHROID) 100 MCG tablet Take 100 mcg by mouth daily before breakfast.   Yes [provider]  lisinopril-hydrochlorothiazide (PRINZIDE,ZESTORETIC) 20-25 MG per tablet Take 1 tablet by mouth daily.   Yes [provider]  mometasone (ASMANEX) 220 MCG/INH inhaler Inhale  2 puffs into the lungs daily.   Yes [provider]  Multiple Vitamin (MULTIVITAMIN) capsule Take 1 capsule by mouth daily.   Yes [provider]  naproxen (NAPROSYN) 500 MG tablet Take 500 mg by mouth 2 (two) times daily as needed.   Yes [provider]  nitroGLYCERIN (NITROSTAT) 0.4 MG SL tablet Place 1 tablet (0.4 mg total) under the tongue every 5 (five) minutes as needed for chest pain. 10/12/13  Yes Antonieta Iba, MD  sertraline (ZOLOFT) 50 MG tablet Take 50 mg by mouth daily.   Yes [provider]  HYDROcodone-acetaminophen (NORCO) 5-325 MG tablet Take 1 tablet by mouth every 6 (six) hours as needed for moderate pain. Patient not taking: Reported on 06/07/2023 06/03/19   Evon Slack, [place]    Physical Exam: Vitals:   06/07/23 1358 06/07/23 1359 06/07/23 1400 06/07/23 1717  BP: 131/80   130/78  Pulse: 99   90  Resp: 18   18  Temp: 97.8 F (36.6 C)     TempSrc: Oral     SpO2: (!) 86% 92%  94%  Weight:   81.6 kg   Height:   5\' 1"  (1.549 m)    Physical Exam Constitutional:      Appearance: She is obese.  HENT:     Head: Normocephalic and atraumatic.     Nose: Nose normal.  Eyes:     Pupils: Pupils are equal, round, and reactive to light.  Cardiovascular:     Rate and Rhythm: Normal rate and regular rhythm.  Pulmonary:     Effort: Pulmonary effort is normal.     Breath sounds: Wheezing present.  Abdominal:     General: Bowel sounds are normal.  Musculoskeletal:        General: Normal range of motion.  Skin:    General: Skin is warm.     Comments: See pictures   Neurological:     General: No focal deficit present.  Psychiatric:        Mood and Affect: Mood normal.      Data Reviewed:  There are no new results to review at this time. CT Angio Chest PE W and/or Wo Contrast CLINICAL DATA:  Pulmonary embolus suspected  EXAM: CT ANGIOGRAPHY CHEST WITH CONTRAST  TECHNIQUE: Multidetector CT imaging of the chest was  performed using the standard protocol during bolus administration of intravenous contrast. Multiplanar CT image reconstructions and MIPs were obtained to evaluate the vascular anatomy.  RADIATION DOSE REDUCTION: This exam was performed according to the departmental dose-optimization program which includes automated exposure control, adjustment of the mA and/or kV according to patient size and/or use of iterative reconstruction technique.  CONTRAST:  75mL OMNIPAQUE IOHEXOL 350 MG/ML SOLN  COMPARISON:  None Available.  FINDINGS: Cardiovascular: No evidence of pulmonary embolus. Normal heart size. No pericardial  effusion. Normal caliber thoracic aorta with moderate atherosclerotic disease. Mild coronary artery calcifications.  Mediastinum/Nodes: Esophagus and thyroid are unremarkable. No enlarged lymph nodes seen in the chest.  Lungs/Pleura: Central airways are patent. Solid pulmonary nodule of the right upper lobe measuring 12 11 mm on series 7, image 69. Additional smaller solid pulmonary nodule of the right upper lobe measuring 8 x 6 mm on image 48. Right middle lobe ground-glass nodule measuring 8 mm on series 7 image 78. Additional small solid pulmonary nodule of the superior portion of the right lower lobe measuring 4 mm on series 7, image 50. No pleural effusion or pneumothorax.  Upper Abdomen: Gallstones. Simple appearing cyst of the upper pole the right kidney no specific follow-up imaging is necessary. Calcifications of the spleen, likely sequela of prior granulomatous infection. No acute abnormality.  Musculoskeletal: No chest wall abnormality. No acute or significant osseous findings.  Review of the MIP images confirms the above findings.  IMPRESSION: 1. No evidence of pulmonary embolus. 2. Suspicious solid pulmonary nodule of the right upper lobe measuring 1.3 cm. Recommend further evaluation with nonemergent PET-CT. 3. Additional smaller solid and ground-glass  pulmonary nodules are seen in the right hemithorax. Recommend attention on follow-up. 4. No enlarged lymph nodes seen in the chest. 5. Mild coronary artery calcifications. 6. Aortic Atherosclerosis (ICD10-I70.0) and Emphysema (ICD10-J43.9).  Electronically Signed   By: Allegra Lai M.D.   On: 06/07/2023 16:30 DG Chest Portable 1 View CLINICAL DATA:  Shortness of breath.  EXAM: PORTABLE CHEST 1 VIEW  COMPARISON:  September 30, 2013.  FINDINGS: Stable cardiomediastinal silhouette. Left lung is clear. New nodular density is noted in right midlung concerning for pulmonary nodule or mass. The visualized skeletal structures are unremarkable.  IMPRESSION: New nodular density seen in right midlung concerning for pulmonary nodule or mass. CT scan of the chest is recommended for further evaluation.  Electronically Signed   By: Lupita Raider M.D.   On: 06/07/2023 15:23  Lab Results  Component Value Date   WBC 7.3 06/07/2023   HGB 14.8 06/07/2023   HCT 45.9 06/07/2023   MCV 90.2 06/07/2023   PLT 193 06/07/2023   Last metabolic panel Lab Results  Component Value Date   GLUCOSE 176 (H) 06/07/2023   NA 141 06/07/2023   K 3.9 06/07/2023   CL 102 06/07/2023   CO2 29 06/07/2023   BUN 21 06/07/2023   CREATININE 0.89 06/07/2023   GFRNONAA >60 06/07/2023   CALCIUM 9.7 06/07/2023   PROT 7.1 06/07/2023   ALBUMIN 4.2 06/07/2023   BILITOT 0.3 06/07/2023   ALKPHOS 93 06/07/2023   AST 17 06/07/2023   ALT 11 06/07/2023   ANIONGAP 10 06/07/2023    Assessment and Plan: * COPD with acute exacerbation (HCC) + increased work of breathing, cough wheezing, sputum production CTA negative  IV solumedrol, duonebs, IV azithromycin  Prn duonebs    Acute respiratory failure with hypoxia (HCC) Decompensated respiratory status with new O2 requirement of 2 L in the setting of COPD exacerbation Positive cough, wheezing, increased sputum production IV Solu-Medrol, azithromycin,  DuoNebs Continue supplemental O2  Follow closely   Pulmonary nodule 1 cm or greater in diameter Noted CT imaging also noted for 1.3 cm pulmonary nodule High concern for neoplastic disease given long standing smoking history  Will need PET-CT, pulm follow up  Discussed smoking cessation at length    Allergic reaction Noted generalized urticarial rash with itching status post course of Augmentin No apparent oral involvement  On IV Solu-Medrol in the setting of COPD exacerbation As needed benadryl Add penicillin to allergy list  Type 2 diabetes mellitus with obesity (HCC) Blood sugar in 170s  SSI  Monitor blood sugars w/ steroid use   Essential hypertension BP stable  Titrate home regimen   Smoker 1/4 PPD smoker  Discussed cessation  Nicotine patch        Advance Care Planning:   Code Status: Full Code   Consults: None   Family Communication: No family at the bedside   Severity of Illness: The appropriate patient status for this patient is OBSERVATION. Observation status is judged to be reasonable and necessary in order to provide the required intensity of service to ensure the patient's safety. The patient's presenting symptoms, physical exam findings, and initial radiographic and laboratory data in the context of their medical condition is felt to place them at decreased risk for further clinical deterioration. Furthermore, it is anticipated that the patient will be medically stable for discharge from the hospital within 2 midnights of admission.   Author: Floydene Flock, MD 06/07/2023 6:35 PM  For on call review [email or web address].

## 2023-06-07 NOTE — Assessment & Plan Note (Signed)
+   increased work of breathing, cough wheezing, sputum production CTA negative  IV solumedrol, duonebs, IV azithromycin  Prn duonebs

## 2023-06-07 NOTE — ED Notes (Addendum)
See triage note  Presents with a rash  States she had the shingles shot  States she feels SOB    Also joint pain  But her O2 Sats were low on arrival  Afebrile on arrival

## 2023-06-07 NOTE — Assessment & Plan Note (Signed)
Noted CT imaging also noted for 1.3 cm pulmonary nodule High concern for neoplastic disease given long standing smoking history  Will need PET-CT, pulm follow up  Discussed smoking cessation at length

## 2023-06-07 NOTE — ED Provider Notes (Signed)
[facility] Provider Note    Event Date/Time   First MD Initiated Contact with Patient 06/07/23 1408     (approximate)   History   Allergic Reaction   HPI  Michele House is a 67 y.o. female presents the ER for evaluation of worsening shortness of breath over past several days.  Recently put on antibiotic and just had Shingrix vaccination.  Presented to the ER because she feels like she is having worsening wheezing consistent with COPD exacerbation.  Also complaining of itchy rash.  No nausea or vomiting.  No throat swelling.  Does not wear home oxygen was found to be hypoxic to the low 80s.     Physical Exam   Triage Vital Signs: ED Triage Vitals  Encounter Vitals Group     BP 06/07/23 1358 131/80     Systolic BP Percentile --      Diastolic BP Percentile --      Pulse Rate 06/07/23 1358 99     Resp 06/07/23 1358 18     Temp 06/07/23 1358 97.8 F (36.6 C)     Temp Source 06/07/23 1358 Oral     SpO2 06/07/23 1358 (!) 86 %     Weight 06/07/23 1400 179 lb 14.3 oz (81.6 kg)     Height 06/07/23 1400 5\' 1"  (1.549 m)     Head Circumference --      Peak Flow --      Pain Score 06/07/23 1400 0     Pain Loc --      Pain Education --      Exclude from Growth Chart --     Most recent vital signs: Vitals:   06/07/23 1358 06/07/23 1359  BP: 131/80   Pulse: 99   Resp: 18   Temp: 97.8 F (36.6 C)   SpO2: (!) 86% 92%     Constitutional: Alert  Eyes: Conjunctivae are normal.  Head: Atraumatic. Nose: No congestion/rhinnorhea. Mouth/Throat: Mucous membranes are moist.  Uvula midline. Neck: Painless ROM.  Cardiovascular:   Good peripheral circulation. Respiratory: Normal respiratory effort.  Tight breath sounds with faint expiratory wheeze heard posteriorly. Gastrointestinal: Soft and nontender.  Musculoskeletal:  no deformity Neurologic:  MAE spontaneously. No gross focal neurologic deficits are appreciated.  Skin:  Skin is warm, dry and  intact.  Psychiatric: Mood and affect are normal. Speech and behavior are normal.    ED Results / Procedures / Treatments   Labs (all labs ordered are listed, but only abnormal results are displayed) Labs Reviewed  COMPREHENSIVE METABOLIC PANEL - Abnormal; Notable for the following components:      Result Value   Glucose, Bld 176 (*)    All other components within normal limits  TSH - Abnormal; Notable for the following components:   TSH 0.306 (*)    All other components within normal limits  SARS CORONAVIRUS 2 BY RT PCR  CBC WITH DIFFERENTIAL/PLATELET  TROPONIN I (HIGH SENSITIVITY)     EK  RADIOLOGY Please see ED Course for my review and interpretation.  I personally reviewed all radiographic images ordered to evaluate for the above acute complaints and reviewed radiology reports and findings.  These findings were personally discussed with the patient.  Please see medical record for radiology report.    PROCEDURES:  Critical Care performed: Yes, see critical care procedure note(s)  .Critical Care  Performed by: Willy Eddy, MD Authorized by: Willy Eddy, MD   Critical care provider statement:  Critical care time (minutes):  35   Critical care was necessary to treat or prevent imminent or life-threatening deterioration of the following conditions:  Respiratory failure   Critical care was time spent personally by me on the following activities:  Ordering and performing treatments and interventions, ordering and review of laboratory studies, ordering and review of radiographic studies, pulse oximetry, re-evaluation of patient's condition, review of old charts, obtaining history from patient or surrogate, examination of patient, evaluation of patient's response to treatment, discussions with primary provider, discussions with consultants and development of treatment plan with patient or surrogate    MEDICATIONS ORDERED IN ED: Medications   ipratropium-albuterol (DUONEB) 0.5-2.5 (3) MG/3ML nebulizer solution 3 mL (3 mLs Nebulization Given 06/07/23 1425)  ipratropium-albuterol (DUONEB) 0.5-2.5 (3) MG/3ML nebulizer solution 3 mL (3 mLs Nebulization Given 06/07/23 1425)  methylPREDNISolone sodium succinate (SOLU-MEDROL) 125 mg/2 mL injection 125 mg (125 mg Intravenous Given 06/07/23 1425)     IMPRESSION / MDM / ASSESSMENT AND PLAN / ED COURSE  I reviewed the triage vital signs and the nursing notes.                              Differential diagnosis includes, but is not limited to, Asthma, copd, CHF, pna, ptx, malignancy, Pe, anemia, anaphylaxis  Patient presenting to the ER for evaluation of symptoms as described above.  Based on symptoms, risk factors and considered above differential, this presenting complaint could reflect a potentially life-threatening illness therefore the patient will be placed on continuous pulse oximetry and telemetry for monitoring.  Laboratory evaluation will be sent to evaluate for the above complaints.  Chief complaint is listed as allergic reaction I think that her presentation more clinically consistent with acute COPD exacerbation.  Have a lower suspicion for anaphylaxis.  Blood work is sent for the above differential.  Will give nebulizer treatments will give IV Solu-Medrol.  Given her acute hypoxic respiratory failure patient will be signed out to oncoming physician pending reassessment.   Clinical Course as of 06/07/23 1537  Mon Jun 07, 2023  1510 Patient with persistent hypoxia down to 88% on room air despite nebulizer treatment steroid.  Anticipate patient will require hospitalization for COPD exacerbation. [PR]    Clinical Course User Index [PR] Willy Eddy, MD   X-ray on my review and interpretation does not show any evidence of edema or pneumothorax.  Per radiology there is a new nodule, masslike opacity recommending CT for further evaluation.  Given her acute hypoxia will order CTA due to  concern for PE.  Will consult hospitalist for admission.  FINAL CLINICAL IMPRESSION(S) / ED DIAGNOSES   Final diagnoses:  Acute respiratory failure with hypoxia (HCC)  COPD exacerbation (HCC)     Rx / DC Orders   ED Discharge Orders     None        Note:  This document was prepared using Dragon voice recognition software and may include unintentional dictation errors.    Willy Eddy, MD 06/07/23 1537

## 2023-06-07 NOTE — Assessment & Plan Note (Addendum)
Noted generalized urticarial rash with itching status post course of Augmentin No apparent oral involvement On IV Solu-Medrol in the setting of COPD exacerbation As needed benadryl Add penicillin to allergy list

## 2023-06-07 NOTE — ED Triage Notes (Signed)
Pt here after an allergic reaction. Pt states she got a shingles vaccine on Friday and has had a rash since then. Pt states some intermittent itchiness. Pt states pain  in her joints.

## 2023-06-08 DIAGNOSIS — T7840XA Allergy, unspecified, initial encounter: Secondary | ICD-10-CM

## 2023-06-08 DIAGNOSIS — J441 Chronic obstructive pulmonary disease with (acute) exacerbation: Secondary | ICD-10-CM | POA: Diagnosis not present

## 2023-06-08 DIAGNOSIS — E669 Obesity, unspecified: Secondary | ICD-10-CM

## 2023-06-08 DIAGNOSIS — J9601 Acute respiratory failure with hypoxia: Secondary | ICD-10-CM | POA: Diagnosis not present

## 2023-06-08 DIAGNOSIS — R911 Solitary pulmonary nodule: Secondary | ICD-10-CM | POA: Diagnosis not present

## 2023-06-08 LAB — GLUCOSE, CAPILLARY
Glucose-Capillary: 236 mg/dL — ABNORMAL HIGH (ref 70–99)
Glucose-Capillary: 153 mg/dL — ABNORMAL HIGH (ref 70–99)

## 2023-06-08 MED ORDER — AZITHROMYCIN 500 MG PO TABS: 500.0000 mg | ORAL_TABLET | Freq: Every day | ORAL | 0 refills | Status: AC

## 2023-06-08 MED ORDER — IPRATROPIUM-ALBUTEROL 20-100 MCG/ACT IN AERS
1.0000 | INHALATION_SPRAY | Freq: Four times a day (QID) | RESPIRATORY_TRACT | Status: DC
  Administered 2023-06-08 (×2): 1 via RESPIRATORY_TRACT
  Filled 2023-06-08: qty 4

## 2023-06-08 MED ORDER — DM-GUAIFENESIN ER 30-600 MG PO TB12: 1.0000 | ORAL_TABLET | Freq: Two times a day (BID) | ORAL | 0 refills | Status: DC | PRN

## 2023-06-08 MED ORDER — LORATADINE 10 MG PO TABS
10.0000 mg | ORAL_TABLET | Freq: Every day | ORAL | Status: DC
  Administered 2023-06-08: 10 mg via ORAL
  Filled 2023-06-08: qty 1

## 2023-06-08 MED ORDER — PREDNISONE 20 MG PO TABS: 40.0000 mg | ORAL_TABLET | Freq: Every day | ORAL | 0 refills | Status: AC

## 2023-06-08 MED ORDER — NICOTINE 7 MG/24HR TD PT24: 7.0000 mg | MEDICATED_PATCH | Freq: Every day | TRANSDERMAL | 0 refills | Status: DC

## 2023-06-08 NOTE — Progress Notes (Signed)
Patient discharging home. PIV removed. Went over discharge instructions, oxygen and medications with patient. All questions answered. Home O2 was delivered. Patient went home POV with sister.

## 2023-06-08 NOTE — Hospital Course (Addendum)
Taken from H&P.   Michele House is a 67 y.o. female with medical history significant of obesity, COPD, type 2 diabetes, hyperlipidemia, hypertension, hypothyroidism presenting with acute respiratory failure hypoxia, COPD exacerbation, allergic reaction.  Patient was recently seen by PCP and was placed on Augmentin for concern of ear infection. Developed generalized rash and itching.  No chest pain or shortness of breath.  On presentation to ED, saturating in mid 80s requiring up to 2 L of oxygen.  Labs mostly unremarkable.  CTA negative for PE but did show a solid pulmonary nodule in the right upper lobe measuring 1.3 cm along with numerous small solid groundglass pulmonary nodules seen in the right hemithorax.  Positive emphysema.  8/6: Vitals and labs stable, no wheezing but she continued to require 2 L of oxygen while resting and 3 L with ambulation..  Patient need to have a close follow-up with pulmonary and likely will need a PET scan and/or biopsy or further investigation of large nodule measuring more than 1 cm.  Patient is a lifetime smoker.  No wheezing today.  Patient is being discharged on 3 more days of Zithromax and prednisone. Penicillin allergy were added to her chart as she developed rash after taking Augmentin.  Patient was given home oxygen.  She was advised to keep her saturation above 90%.  A1c of 7.1.  Patient was advised to monitor her blood glucose level carefully as she has been given 4 days of steroid for COPD exacerbation.  Patient need to have a close follow-up with pulmonary as an outpatient for further management of her concerning lung nodule.

## 2023-06-08 NOTE — Discharge Summary (Signed)
Physician Discharge Summary   Patient: Michele House MRN: [id number] DOB: 27-Feb-1956  Admit date:     06/07/2023  Discharge date: 06/08/23  Discharge Physician: Arnetha Courser   PCP: [facility]   Recommendations at discharge:  Please obtain CBC and BMP in 1 week Follow-up with pulmonology ASAP for further investigation and management of concerning lung nodule. Patient is being given home oxygen, please do a walk test and make sure that she is keeping her saturation above 90%. Follow-up with primary care provider.  Discharge Diagnoses: Principal Problem:   COPD with acute exacerbation (HCC) Active Problems:   Acute respiratory failure with hypoxia (HCC)   Pulmonary nodule 1 cm or greater in diameter   Smoker   Essential hypertension   Type 2 diabetes mellitus with obesity (HCC)   Allergic reaction   Hospital Course: Taken from H&P.   Michele House is a 67 y.o. female with medical history significant of obesity, COPD, type 2 diabetes, hyperlipidemia, hypertension, hypothyroidism presenting with acute respiratory failure hypoxia, COPD exacerbation, allergic reaction.  Patient was recently seen by PCP and was placed on Augmentin for concern of ear infection. Developed generalized rash and itching.  No chest pain or shortness of breath.  On presentation to ED, saturating in mid 80s requiring up to 2 L of oxygen.  Labs mostly unremarkable.  CTA negative for PE but did show a solid pulmonary nodule in the right upper lobe measuring 1.3 cm along with numerous small solid groundglass pulmonary nodules seen in the right hemithorax.  Positive emphysema.  8/6: Vitals and labs stable, no wheezing but she continued to require 2 L of oxygen while resting and 3 L with ambulation..  Patient need to have a close follow-up with pulmonary and likely will need a PET scan and/or biopsy or further investigation of large nodule measuring more than 1 cm.  Patient is a lifetime  smoker.  No wheezing today.  Patient is being discharged on 3 more days of Zithromax and prednisone. Penicillin allergy were added to her chart as she developed rash after taking Augmentin.  Patient was given home oxygen.  She was advised to keep her saturation above 90%.  A1c of 7.1.  Patient was advised to monitor her blood glucose level carefully as she has been given 4 days of steroid for COPD exacerbation.  Patient need to have a close follow-up with pulmonary as an outpatient for further management of her concerning lung nodule.  Assessment and Plan: * COPD with acute exacerbation (HCC) + increased work of breathing, cough wheezing, sputum production, new oxygen requirement CTA negative  Received IV solumedrol, duonebs, IV azithromycin  Being discharged on 4 more days of Zithromax and prednisone   Acute respiratory failure with hypoxia (HCC) Patient with concern of COPD and emphysema on CT chest. Continue to require oxygen, history of significant dyspnea, likely needing oxygen but never monitor her home saturation. She has been given home oxygen to use 2 L at rest and 3 L with ambulation.  Pulmonary can follow-up and manage.  Pulmonary nodule 1 cm or greater in diameter Noted CT imaging also noted for 1.3 cm pulmonary nodule High concern for neoplastic disease given long standing smoking history  Will need PET-CT, pulm follow up  Discussed smoking cessation at length  She is being referred to see a pulmonologist for further investigation and management   Allergic reaction Noted generalized urticarial rash with itching status post course of Augmentin No apparent oral involvement  On IV Solu-Medrol in the setting of COPD exacerbation As needed benadryl Add penicillin to allergy list  Type 2 diabetes mellitus with obesity (HCC) Blood sugar in 170s  SSI  Monitor blood sugars w/ steroid use   Essential hypertension BP stable  Titrate home regimen   Smoker 1/4 PPD  smoker  Discussed cessation  Nicotine patch   Consultants: None Procedures performed: None Disposition: Home Diet recommendation:  Discharge Diet Orders (From admission, onward)     Start     Ordered   06/08/23 0000  Diet - low sodium heart healthy        06/08/23 1052           Cardiac and Carb modified diet DISCHARGE MEDICATION: Allergies as of 06/08/2023       Reactions   Penicillins Hives   Aspirin Other (See Comments)   Lips numb   Sulfa Antibiotics         Medication List     STOP taking these medications    HYDROcodone-acetaminophen 5-325 MG tablet Commonly known as: Norco   VITAMIN D PO       TAKE these medications    aspirin EC 81 MG tablet Take 81 mg by mouth daily.   azithromycin 500 MG tablet Commonly known as: ZITHROMAX Take 1 tablet (500 mg total) by mouth daily for 3 days.   b complex-C-folic acid 1 MG capsule Take 1 capsule by mouth daily.   cetirizine 10 MG tablet Commonly known as: ZYRTEC Take 10 mg by mouth daily.   cholecalciferol 25 MCG (1000 UNIT) tablet Commonly known as: VITAMIN D3 Take 1,000 Units by mouth daily.   diclofenac sodium 1 % Gel Commonly known as: VOLTAREN Apply topically 4 (four) times daily.   glipiZIDE 2.5 MG 24 hr tablet Commonly known as: GLUCOTROL XL Take 2.5 mg by mouth daily with breakfast.   ipratropium 17 MCG/ACT inhaler Commonly known as: ATROVENT HFA Inhale 2 puffs into the lungs every 6 (six) hours.   levothyroxine 100 MCG tablet Commonly known as: SYNTHROID Take 100 mcg by mouth daily before breakfast.   lisinopril-hydrochlorothiazide 20-25 MG tablet Commonly known as: ZESTORETIC Take 1 tablet by mouth daily.   mometasone 220 MCG/INH inhaler Commonly known as: ASMANEX Inhale 2 puffs into the lungs daily.   multivitamin capsule Take 1 capsule by mouth daily.   naproxen 500 MG tablet Commonly known as: NAPROSYN Take 500 mg by mouth 2 (two) times daily as needed.   nicotine 7  mg/24hr patch Commonly known as: NICODERM CQ - dosed in mg/24 hr Place 1 patch (7 mg total) onto the skin daily. Start taking on: June 09, 2023   nitroGLYCERIN 0.4 MG SL tablet Commonly known as: NITROSTAT Place 1 tablet (0.4 mg total) under the tongue every 5 (five) minutes as needed for chest pain.   predniSONE 20 MG tablet Commonly known as: DELTASONE Take 2 tablets (40 mg total) by mouth daily with breakfast for 4 days. Start taking on: June 09, 2023   sertraline 50 MG tablet Commonly known as: ZOLOFT Take 50 mg by mouth daily.               Durable Medical Equipment  (From admission, onward)           Start     Ordered   06/08/23 1108  For home use only DME oxygen  Once       Question Answer Comment  Length of Need Lifetime   Mode or (  Route) Nasal cannula   Liters per Minute 3   Frequency Continuous (stationary and portable oxygen unit needed)   Oxygen conserving device Yes   Oxygen delivery system Gas      06/08/23 1108            Follow-up Information     [facility] [employer] Schedule an appointment as soon as possible for a visit in 1 week(s).   Contact information: [address] [place] [address] [phone]         Vida Rigger, MD. Schedule an appointment as soon as possible for a visit in 1 week(s).   Specialty: Pulmonary Disease Contact information: [address] [place] [address] [phone]                Discharge Exam: Ceasar Mons Weights   06/07/23 1400 06/07/23 1944  Weight: 81.6 kg 70.7 kg   General.  Well-developed lady, in no acute distress. Pulmonary.  Lungs clear bilaterally, normal respiratory effort. CV.  Regular rate and rhythm, no JVD, rub or murmur. Abdomen.  Soft, nontender, nondistended, BS positive. CNS.  Alert and oriented .  No focal neurologic deficit. Extremities.  No edema, no cyanosis, pulses intact and symmetrical. Psychiatry.  Judgment and insight appears normal.    Condition at discharge: stable  The results of significant diagnostics from this hospitalization (including imaging, microbiology, ancillary and laboratory) are listed below for reference.   Imaging Studies: CT Angio Chest PE W and/or Wo Contrast  Result Date: 06/07/2023 CLINICAL DATA:  Pulmonary embolus suspected EXAM: CT ANGIOGRAPHY CHEST WITH CONTRAST TECHNIQUE: Multidetector CT imaging of the chest was performed using the standard protocol during bolus administration of intravenous contrast. Multiplanar CT image reconstructions and MIPs were obtained to evaluate the vascular anatomy. RADIATION DOSE REDUCTION: This exam was performed according to the departmental dose-optimization program which includes automated exposure control, adjustment of the mA and/or kV according to patient size and/or use of iterative reconstruction technique. CONTRAST:  75mL OMNIPAQUE IOHEXOL 350 MG/ML SOLN COMPARISON:  None Available. FINDINGS: Cardiovascular: No evidence of pulmonary embolus. Normal heart size. No pericardial effusion. Normal caliber thoracic aorta with moderate atherosclerotic disease. Mild coronary artery calcifications. Mediastinum/Nodes: Esophagus and thyroid are unremarkable. No enlarged lymph nodes seen in the chest. Lungs/Pleura: Central airways are patent. Solid pulmonary nodule of the right upper lobe measuring 12 11 mm on series 7, image 69. Additional smaller solid pulmonary nodule of the right upper lobe measuring 8 x 6 mm on image 48. Right middle lobe ground-glass nodule measuring 8 mm on series 7 image 78. Additional small solid pulmonary nodule of the superior portion of the right lower lobe measuring 4 mm on series 7, image 50. No pleural effusion or pneumothorax. Upper Abdomen: Gallstones. Simple appearing cyst of the upper pole the right kidney no specific follow-up imaging is necessary. Calcifications of the spleen, likely sequela of prior granulomatous infection. No acute abnormality.  Musculoskeletal: No chest wall abnormality. No acute or significant osseous findings. Review of the MIP images confirms the above findings. IMPRESSION: 1. No evidence of pulmonary embolus. 2. Suspicious solid pulmonary nodule of the right upper lobe measuring 1.3 cm. Recommend further evaluation with nonemergent PET-CT. 3. Additional smaller solid and ground-glass pulmonary nodules are seen in the right hemithorax. Recommend attention on follow-up. 4. No enlarged lymph nodes seen in the chest. 5. Mild coronary artery calcifications. 6. Aortic Atherosclerosis (ICD10-I70.0) and Emphysema (ICD10-J43.9). Electronically Signed   By: Allegra Lai M.D.   On:  06/07/2023 16:30   DG Chest Portable 1 View  Result Date: 06/07/2023 CLINICAL DATA:  Shortness of breath. EXAM: PORTABLE CHEST 1 VIEW COMPARISON:  September 30, 2013. FINDINGS: Stable cardiomediastinal silhouette. Left lung is clear. New nodular density is noted in right midlung concerning for pulmonary nodule or mass. The visualized skeletal structures are unremarkable. IMPRESSION: New nodular density seen in right midlung concerning for pulmonary nodule or mass. CT scan of the chest is recommended for further evaluation. Electronically Signed   By: Lupita Raider M.D.   On: 06/07/2023 15:23    Microbiology: Results for orders placed or performed during the hospital encounter of 06/07/23  SARS Coronavirus 2 by RT PCR (hospital order, performed in [facility] hospital lab) *cepheid single result test* Anterior Nasal Swab     Status: None   Collection Time: 06/07/23  3:15 PM   Specimen: Anterior Nasal Swab  Result Value Ref Range Status   SARS Coronavirus 2 by RT PCR NEGATIVE NEGATIVE Final    Comment: (NOTE) SARS-CoV-2 target nucleic acids are NOT DETECTED.  The SARS-CoV-2 RNA is generally detectable in upper and lower respiratory specimens during the acute phase of infection. The lowest concentration of SARS-CoV-2 viral copies this assay can  detect is 250 copies / mL. A negative result does not preclude SARS-CoV-2 infection and should not be used as the sole basis for treatment or other patient management decisions.  A negative result may occur with improper specimen collection / handling, submission of specimen other than nasopharyngeal swab, presence of viral mutation(s) within the areas targeted by this assay, and inadequate number of viral copies (<250 copies / mL). A negative result must be combined with clinical observations, patient history, and epidemiological information.  Fact Sheet for Patients:   RoadLapTop.co.za  Fact Sheet for Healthcare Providers: http://kim-miller.com/  This test is not yet approved or  cleared by the [place] FDA and has been authorized for detection and/or diagnosis of SARS-CoV-2 by FDA under an Emergency Use Authorization (EUA).  This EUA will remain in effect (meaning this test can be used) for the duration of the COVID-19 declaration under Section 564(b)(1) of the Act, 21 U.S.C. section 360bbb-3(b)(1), unless the authorization is terminated or revoked sooner.  Performed at [facility], [address] [place], [place] [address]     Labs: CBC: Recent Labs  Lab 06/07/23 1419 06/08/23 0606  WBC 7.3 8.2  NEUTROABS 5.5  --   HGB 14.8 14.4  HCT 45.9 44.1  MCV 90.2 88.9  PLT 193 195   Basic Metabolic Panel: Recent Labs  Lab 06/07/23 1419 06/08/23 0606  NA 141 141  K 3.9 4.0  CL 102 101  CO2 29 29  GLUCOSE 176* 151*  BUN 21 24*  CREATININE 0.89 0.89  CALCIUM 9.7 9.5   Liver Function Tests: Recent Labs  Lab 06/07/23 1419 06/08/23 0606  AST 17 16  ALT 11 11  ALKPHOS 93 88  BILITOT 0.3 0.5  PROT 7.1 6.9  ALBUMIN 4.2 3.9   CBG: Recent Labs  Lab 06/07/23 2142 06/08/23 0752  GLUCAP 361* 236*    Discharge time spent: greater than 30 minutes.  This record has been created using Software engineer. Errors have been sought and corrected,but may not always be located. Such creation errors do not reflect on the standard of care.   Signed: Arnetha Courser, MD [facility] 06/08/2023

## 2023-06-08 NOTE — Discharge Instructions (Addendum)
You have been given some oxygen to use at 2 to 3 L all the time Please keep checking your oxygen level and keep it above 90%

## 2023-06-08 NOTE — Plan of Care (Signed)

## 2023-06-08 NOTE — TOC Initial Note (Signed)
Transition of Care [facility]) - Initial/Assessment Note    Patient Details  Name: Michele House MRN: [id number] Date of Birth: 1956/02/16  Transition of Care [facility]) CM/SW Contact:    Allena Katz, LCSW Phone Number: 06/08/2023, 11:02 AM  Clinical Narrative:     CSW spoke with pt she reports she receives money from her insurance to assist with food and that she doesn't have food nor transportation concerns. Pt actively discharging and needing oxygen oxygen ordered through Trudie Reed contacted and bringing portable [facility] [place]          Patient Goals and CMS Choice            Expected Discharge Plan and Services         Expected Discharge Date: 06/08/23                                    Prior Living Arrangements/Services                       Activities of Daily Living Home Assistive Devices/Equipment: None ADL Screening (condition at time of admission) Patient's cognitive ability adequate to safely complete daily activities?: Yes Is the patient deaf or have difficulty hearing?: Yes Does the patient have difficulty seeing, even when wearing glasses/contacts?: Yes Does the patient have difficulty concentrating, remembering, or making decisions?: No Patient able to express need for assistance with ADLs?: Yes Does the patient have difficulty dressing or bathing?: No Independently performs ADLs?: Yes (appropriate for developmental age) Communication: Independent Dressing (OT): Independent Grooming: Independent Feeding: Independent Bathing: Independent Toileting: Independent In/Out Bed: Independent Walks in Home: Independent Does the patient have difficulty walking or climbing stairs?: No Weakness of Legs: Both Weakness of Arms/Hands: None  Permission Sought/Granted                  Emotional Assessment              Admission diagnosis:  COPD exacerbation (HCC) [J44.1] Acute respiratory failure  with hypoxia (HCC) [J96.01] Patient Active Problem List   Diagnosis Date Noted   COPD with acute exacerbation (HCC) 06/07/2023   Acute respiratory failure with hypoxia (HCC) 06/07/2023   Pulmonary nodule 1 cm or greater in diameter 06/07/2023   Allergic reaction 06/07/2023   Chest discomfort 10/12/2013   Smoker 10/12/2013   Essential hypertension 10/12/2013   Obesity 10/12/2013   Type 2 diabetes mellitus with obesity (HCC) 10/12/2013   PCP:  [employer] [facility] Pharmacy:   [facility] - [place], [place] - [address] [place] [address] Phone: [phone] Fax: [phone]  [facility] [address], [place] - [address] [place] [address] Phone: [phone] Fax: [phone]     Social Determinants of Health (SDOH) Social History: SDOH Screenings   Food Insecurity: Food Insecurity Present (06/07/2023)  Housing: Low Risk  (06/07/2023)  Transportation Needs: Unmet Transportation Needs (06/07/2023)  Utilities: Not At Risk (06/07/2023)  Tobacco Use: Medium Risk (06/07/2023)   SDOH Interventions:     Readmission Risk Interventions     No data to display

## 2023-06-08 NOTE — Progress Notes (Addendum)
SATURATION QUALIFICATIONS: (This note is used to comply with regulatory documentation for home oxygen)  Patient Saturations on Room Air at Rest = 86%  Patient Saturations on Room Air while Ambulating = 84%  Patient Saturations on 2 Liters of oxygen while Ambulating = 86%  Please briefly explain why patient needs home oxygen: resting on 2 LPM O2 98% Patient needs to be on 3 LPM  O2 when ambulating

## 2023-07-28 ENCOUNTER — Other Ambulatory Visit: Payer: Self-pay | Admitting: Family Medicine

## 2023-07-28 DIAGNOSIS — Z780 Asymptomatic menopausal state: Secondary | ICD-10-CM

## 2023-07-28 DIAGNOSIS — Z1231 Encounter for screening mammogram for malignant neoplasm of breast: Secondary | ICD-10-CM

## 2023-09-20 ENCOUNTER — Other Ambulatory Visit: Payer: Self-pay | Admitting: Pulmonary Disease

## 2023-09-20 DIAGNOSIS — R911 Solitary pulmonary nodule: Secondary | ICD-10-CM

## 2023-09-23 ENCOUNTER — Emergency Department: Payer: 59

## 2023-09-23 ENCOUNTER — Other Ambulatory Visit: Payer: Self-pay

## 2023-09-23 ENCOUNTER — Inpatient Hospital Stay
Admission: EM | Admit: 2023-09-23 | Discharge: 2023-09-26 | DRG: 190 | Disposition: A | Discharge status: Home / Self Care | Payer: 59 | Attending: Internal Medicine | Admitting: Internal Medicine

## 2023-09-23 DIAGNOSIS — Z882 Allergy status to sulfonamides status: Secondary | ICD-10-CM

## 2023-09-23 DIAGNOSIS — J440 Chronic obstructive pulmonary disease with acute lower respiratory infection: Secondary | ICD-10-CM | POA: Diagnosis present

## 2023-09-23 DIAGNOSIS — Z803 Family history of malignant neoplasm of breast: Secondary | ICD-10-CM

## 2023-09-23 DIAGNOSIS — J9621 Acute and chronic respiratory failure with hypoxia: Secondary | ICD-10-CM | POA: Diagnosis not present

## 2023-09-23 DIAGNOSIS — F17210 Nicotine dependence, cigarettes, uncomplicated: Secondary | ICD-10-CM | POA: Diagnosis present

## 2023-09-23 DIAGNOSIS — Z79890 Hormone replacement therapy (postmenopausal): Secondary | ICD-10-CM

## 2023-09-23 DIAGNOSIS — J9811 Atelectasis: Secondary | ICD-10-CM | POA: Diagnosis present

## 2023-09-23 DIAGNOSIS — I10 Essential (primary) hypertension: Secondary | ICD-10-CM | POA: Diagnosis present

## 2023-09-23 DIAGNOSIS — Z9981 Dependence on supplemental oxygen: Secondary | ICD-10-CM

## 2023-09-23 DIAGNOSIS — R911 Solitary pulmonary nodule: Secondary | ICD-10-CM | POA: Diagnosis present

## 2023-09-23 DIAGNOSIS — Z79899 Other long term (current) drug therapy: Secondary | ICD-10-CM

## 2023-09-23 DIAGNOSIS — Z880 Allergy status to penicillin: Secondary | ICD-10-CM

## 2023-09-23 DIAGNOSIS — R0602 Shortness of breath: Secondary | ICD-10-CM

## 2023-09-23 DIAGNOSIS — I110 Hypertensive heart disease with heart failure: Secondary | ICD-10-CM | POA: Diagnosis present

## 2023-09-23 DIAGNOSIS — K219 Gastro-esophageal reflux disease without esophagitis: Secondary | ICD-10-CM | POA: Diagnosis present

## 2023-09-23 DIAGNOSIS — I509 Heart failure, unspecified: Secondary | ICD-10-CM

## 2023-09-23 DIAGNOSIS — E119 Type 2 diabetes mellitus without complications: Secondary | ICD-10-CM | POA: Diagnosis present

## 2023-09-23 DIAGNOSIS — F17200 Nicotine dependence, unspecified, uncomplicated: Secondary | ICD-10-CM | POA: Diagnosis present

## 2023-09-23 DIAGNOSIS — Z7982 Long term (current) use of aspirin: Secondary | ICD-10-CM

## 2023-09-23 DIAGNOSIS — J441 Chronic obstructive pulmonary disease with (acute) exacerbation: Principal | ICD-10-CM | POA: Diagnosis present

## 2023-09-23 DIAGNOSIS — E785 Hyperlipidemia, unspecified: Secondary | ICD-10-CM | POA: Diagnosis present

## 2023-09-23 DIAGNOSIS — Z8249 Family history of ischemic heart disease and other diseases of the circulatory system: Secondary | ICD-10-CM

## 2023-09-23 DIAGNOSIS — Z886 Allergy status to analgesic agent status: Secondary | ICD-10-CM

## 2023-09-23 DIAGNOSIS — J189 Pneumonia, unspecified organism: Secondary | ICD-10-CM

## 2023-09-23 DIAGNOSIS — E1165 Type 2 diabetes mellitus with hyperglycemia: Secondary | ICD-10-CM | POA: Diagnosis present

## 2023-09-23 DIAGNOSIS — Z7984 Long term (current) use of oral hypoglycemic drugs: Secondary | ICD-10-CM

## 2023-09-23 DIAGNOSIS — E669 Obesity, unspecified: Secondary | ICD-10-CM | POA: Diagnosis present

## 2023-09-23 DIAGNOSIS — E1169 Type 2 diabetes mellitus with other specified complication: Secondary | ICD-10-CM | POA: Diagnosis present

## 2023-09-23 DIAGNOSIS — I5031 Acute diastolic (congestive) heart failure: Secondary | ICD-10-CM | POA: Diagnosis present

## 2023-09-23 DIAGNOSIS — J449 Chronic obstructive pulmonary disease, unspecified: Secondary | ICD-10-CM | POA: Diagnosis present

## 2023-09-23 DIAGNOSIS — Z1152 Encounter for screening for COVID-19: Secondary | ICD-10-CM

## 2023-09-23 DIAGNOSIS — J029 Acute pharyngitis, unspecified: Principal | ICD-10-CM

## 2023-09-23 LAB — COMPREHENSIVE METABOLIC PANEL
ALT: 20 U/L (ref 0–44)
AST: 21 U/L (ref 15–41)
Albumin: 3.3 g/dL — ABNORMAL LOW (ref 3.5–5.0)
Alkaline Phosphatase: 139 U/L — ABNORMAL HIGH (ref 38–126)
Anion gap: 12 (ref 5–15)
BUN: 34 mg/dL — ABNORMAL HIGH (ref 8–23)
CO2: 32 mmol/L (ref 22–32)
Calcium: 10.2 mg/dL (ref 8.9–10.3)
Chloride: 96 mmol/L — ABNORMAL LOW (ref 98–111)
Creatinine, Ser: 0.88 mg/dL (ref 0.44–1.00)
GFR, Estimated: >60 mL/min (ref >60)
Glucose, Bld: 170 mg/dL — ABNORMAL HIGH (ref 70–99)
Potassium: 3.6 mmol/L (ref 3.5–5.1)
Sodium: 140 mmol/L (ref 135–145)
Total Bilirubin: 0.6 mg/dL (ref <1.2)
Total Protein: 7.2 g/dL (ref 6.5–8.1)

## 2023-09-23 LAB — PROTIME-INR
INR: 1.1 (ref 0.8–1.2)
Prothrombin Time: 14.4 s (ref 11.4–15.2)

## 2023-09-23 LAB — CBC WITH DIFFERENTIAL/PLATELET
Abs Immature Granulocytes: 0.09 10*3/uL — ABNORMAL HIGH (ref 0.00–0.07)
Basophils Absolute: 0 10*3/uL (ref 0.0–0.1)
Basophils Relative: 0 %
Eosinophils Absolute: 0 10*3/uL (ref 0.0–0.5)
Eosinophils Relative: 0 %
HCT: 38.3 % (ref 36.0–46.0)
Hemoglobin: 12.5 g/dL (ref 12.0–15.0)
Immature Granulocytes: 1 %
Lymphocytes Relative: 6 %
Lymphs Abs: 0.9 10*3/uL (ref 0.7–4.0)
MCH: 28.9 pg (ref 26.0–34.0)
MCHC: 32.6 g/dL (ref 30.0–36.0)
MCV: 88.5 fL (ref 80.0–100.0)
Monocytes Absolute: 1.3 10*3/uL — ABNORMAL HIGH (ref 0.1–1.0)
Monocytes Relative: 9 %
Neutro Abs: 12.2 10*3/uL — ABNORMAL HIGH (ref 1.7–7.7)
Neutrophils Relative %: 84 %
Platelets: 264 10*3/uL (ref 150–400)
RBC: 4.33 MIL/uL (ref 3.87–5.11)
RDW: 13.6 % (ref 11.5–15.5)
WBC: 14.4 10*3/uL — ABNORMAL HIGH (ref 4.0–10.5)
nRBC: 0 % (ref 0.0–0.2)

## 2023-09-23 LAB — SARS CORONAVIRUS 2 BY RT PCR: SARS Coronavirus 2 by RT PCR: NEGATIVE

## 2023-09-23 LAB — TROPONIN I (HIGH SENSITIVITY)
Troponin I (High Sensitivity): 22 ng/L — ABNORMAL HIGH (ref <18)
Troponin I (High Sensitivity): 13 ng/L (ref <18)

## 2023-09-23 LAB — D-DIMER, QUANTITATIVE: D-Dimer, Quant: 3.21 ug{FEU}/mL — ABNORMAL HIGH (ref 0.00–0.50)

## 2023-09-23 LAB — CBG MONITORING, ED: Glucose-Capillary: 408 mg/dL — ABNORMAL HIGH (ref 70–99)

## 2023-09-23 LAB — BRAIN NATRIURETIC PEPTIDE: B Natriuretic Peptide: 154.8 pg/mL — ABNORMAL HIGH (ref 0.0–100.0)

## 2023-09-23 MED ORDER — FUROSEMIDE 10 MG/ML IJ SOLN
20.0000 mg | Freq: Two times a day (BID) | INTRAMUSCULAR | Status: DC
  Administered 2023-09-24 (×2): 20 mg via INTRAVENOUS
  Filled 2023-09-23 (×3): qty 4
  Filled 2023-09-23: qty 2

## 2023-09-23 MED ORDER — ACETAMINOPHEN 325 MG PO TABS: 650.0000 mg | ORAL_TABLET | Freq: Four times a day (QID) | ORAL | Status: DC | PRN

## 2023-09-23 MED ORDER — PREDNISONE 20 MG PO TABS
40.0000 mg | ORAL_TABLET | Freq: Every day | ORAL | Status: DC
Start: 2023-09-25 — End: 2023-09-26
  Administered 2023-09-25 – 2023-09-26 (×2): 40 mg via ORAL
  Filled 2023-09-23 (×2): qty 2

## 2023-09-23 MED ORDER — ALBUTEROL SULFATE (2.5 MG/3ML) 0.083% IN NEBU: 2.5000 mg | INHALATION_SOLUTION | RESPIRATORY_TRACT | Status: DC | PRN

## 2023-09-23 MED ORDER — ONDANSETRON HCL 4 MG/2ML IJ SOLN: 4.0000 mg | Freq: Four times a day (QID) | INTRAMUSCULAR | Status: DC | PRN

## 2023-09-23 MED ORDER — ASPIRIN 81 MG PO TBEC
81.0000 mg | DELAYED_RELEASE_TABLET | Freq: Every day | ORAL | Status: DC
  Administered 2023-09-24 – 2023-09-26 (×3): 81 mg via ORAL
  Filled 2023-09-23 (×3): qty 1

## 2023-09-23 MED ORDER — ENOXAPARIN SODIUM 40 MG/0.4ML IJ SOSY
40.0000 mg | PREFILLED_SYRINGE | INTRAMUSCULAR | Status: DC
  Administered 2023-09-23 – 2023-09-25 (×3): 40 mg via SUBCUTANEOUS
  Filled 2023-09-23 (×3): qty 0.4

## 2023-09-23 MED ORDER — LORATADINE 10 MG PO TABS
10.0000 mg | ORAL_TABLET | Freq: Every day | ORAL | Status: DC
  Administered 2023-09-24 – 2023-09-26 (×3): 10 mg via ORAL
  Filled 2023-09-23 (×3): qty 1

## 2023-09-23 MED ORDER — IPRATROPIUM-ALBUTEROL 0.5-2.5 (3) MG/3ML IN SOLN
3.0000 mL | Freq: Four times a day (QID) | RESPIRATORY_TRACT | Status: DC
  Administered 2023-09-23 – 2023-09-25 (×7): 3 mL via RESPIRATORY_TRACT
  Filled 2023-09-23 (×7): qty 3

## 2023-09-23 MED ORDER — INSULIN ASPART 100 UNIT/ML IJ SOLN
0.0000 [IU] | Freq: Three times a day (TID) | INTRAMUSCULAR | Status: DC
  Administered 2023-09-24: 11 [IU] via SUBCUTANEOUS
  Administered 2023-09-24: 3 [IU] via SUBCUTANEOUS
  Administered 2023-09-25: 11 [IU] via SUBCUTANEOUS
  Administered 2023-09-25: 7 [IU] via SUBCUTANEOUS
  Administered 2023-09-25: 4 [IU] via SUBCUTANEOUS
  Administered 2023-09-26: 7 [IU] via SUBCUTANEOUS
  Administered 2023-09-26: 20 [IU] via SUBCUTANEOUS
  Filled 2023-09-23 (×8): qty 1

## 2023-09-23 MED ORDER — ACETAMINOPHEN 650 MG RE SUPP: 650.0000 mg | Freq: Four times a day (QID) | RECTAL | Status: DC | PRN

## 2023-09-23 MED ORDER — FUROSEMIDE 10 MG/ML IJ SOLN
20.0000 mg | Freq: Once | INTRAMUSCULAR | Status: AC
  Administered 2023-09-23: 20 mg via INTRAVENOUS
  Filled 2023-09-23: qty 4

## 2023-09-23 MED ORDER — LEVOTHYROXINE SODIUM 100 MCG PO TABS
100.0000 ug | ORAL_TABLET | Freq: Every day | ORAL | Status: DC
  Administered 2023-09-24 – 2023-09-26 (×3): 100 ug via ORAL
  Filled 2023-09-23 (×3): qty 1

## 2023-09-23 MED ORDER — METHYLPREDNISOLONE SODIUM SUCC 40 MG IJ SOLR
40.0000 mg | Freq: Two times a day (BID) | INTRAMUSCULAR | Status: AC
  Administered 2023-09-23 – 2023-09-24 (×2): 40 mg via INTRAVENOUS
  Filled 2023-09-23 (×2): qty 1

## 2023-09-23 MED ORDER — NICOTINE 7 MG/24HR TD PT24
7.0000 mg | MEDICATED_PATCH | Freq: Every day | TRANSDERMAL | Status: DC
  Administered 2023-09-24 – 2023-09-26 (×3): 7 mg via TRANSDERMAL
  Filled 2023-09-23 (×3): qty 1

## 2023-09-23 MED ORDER — INSULIN ASPART 100 UNIT/ML IJ SOLN: 0.0000 [IU] | Freq: Every day | INTRAMUSCULAR | Status: DC

## 2023-09-23 MED ORDER — ONDANSETRON HCL 4 MG PO TABS: 4.0000 mg | ORAL_TABLET | Freq: Four times a day (QID) | ORAL | Status: DC | PRN

## 2023-09-23 MED ORDER — SERTRALINE HCL 50 MG PO TABS
50.0000 mg | ORAL_TABLET | Freq: Every day | ORAL | Status: DC
  Administered 2023-09-24 – 2023-09-26 (×3): 50 mg via ORAL
  Filled 2023-09-23 (×3): qty 1

## 2023-09-23 MED ORDER — PREDNISONE 10 MG (21) PO TBPK: ORAL_TABLET | ORAL | 0 refills | Status: DC

## 2023-09-23 MED ORDER — DM-GUAIFENESIN ER 30-600 MG PO TB12
1.0000 | ORAL_TABLET | Freq: Two times a day (BID) | ORAL | Status: DC | PRN
  Administered 2023-09-25: 1 via ORAL
  Filled 2023-09-23 (×2): qty 1

## 2023-09-23 MED ORDER — AZITHROMYCIN 250 MG PO TABS: ORAL_TABLET | ORAL | 0 refills | Status: AC

## 2023-09-23 NOTE — ED Notes (Signed)
This RN assisted pt. Up to toilet. Pt. Placed on 4L [place] for ambulation. Pt. Appears very winded and SOB with dizziness when walking. While ambulating, pt. Desat to 83%. When pt. Returned to bed, slowly recovered oxygen sat. Dr. Vicente Males notified.

## 2023-09-23 NOTE — Assessment & Plan Note (Signed)
Hold home oral hypoglycemics Sliding scale insulin coverage Continue aspirin

## 2023-09-23 NOTE — Assessment & Plan Note (Addendum)
Chest x-ray shows continued presence of nodule from August with recommendation for PET scan Will get D-dimer given sudden onset of symptoms and if elevated will get CTA chest Addendum: CTA chest IMPRESSION: No evidence of pulmonary embolus.  Patchy bilateral ground-glass and nodular opacities, right greater than left. Appearance is concerning for pneumonia.  Enlarging spiculated nodule in the inferior right upper lobe now 1.6 cm compared to 1.3 cm previously. Recommend further evaluation with PET CT.

## 2023-09-23 NOTE — ED Provider Notes (Signed)
[facility] Provider Note   Event Date/Time   First MD Initiated Contact with Patient 09/23/23 1522     (approximate) History  Shortness of Breath  HPI Michele House is a 67 y.o. female with a stated past medical history of a pulmonary nodule and COPD who presents complaining of worsening sore throat, globus sensation, and shortness of breath that is been worsening over the last 2-3 days.  Patient also endorses nonproductive cough.  Patient denies any recent travel or sick contacts. ROS: Patient currently denies any fever, vision changes, tinnitus, difficulty speaking, facial droop, chest pain, abdominal pain, nausea/vomiting/diarrhea, dysuria, or weakness/numbness/paresthesias in any extremity   Physical Exam  Triage Vital Signs: ED Triage Vitals  Encounter Vitals Group     BP 09/23/23 1531 (!) 111/95     Systolic BP Percentile --      Diastolic BP Percentile --      Pulse Rate 09/23/23 1531 100     Resp 09/23/23 1531 (!) 24     Temp 09/23/23 1531 98.2 F (36.8 C)     Temp Source 09/23/23 1531 Oral     SpO2 09/23/23 1524 (!) 86 %     Weight 09/23/23 1533 170 lb (77.1 kg)     Height 09/23/23 1533 5\' 1"  (1.549 m)     Head Circumference --      Peak Flow --      Pain Score 09/23/23 1532 2     Pain Loc --      Pain Education --      Exclude from Growth Chart --    Most recent vital signs: Vitals:   09/23/23 2130 09/23/23 2300  BP: (!) 142/81 128/82  Pulse: 100 (!) 105  Resp: (!) 22 18  Temp:    SpO2: 92% 95%   General: Awake, oriented x4. CV:  Good peripheral perfusion.  Resp:  Normal effort.  Clear to auscultation bilaterally Abd:  No distention.  Other:  Elderly obese Caucasian female resting comfortably in no acute distress.  Erythematous posterior oropharynx with associated anterior cervical lymphadenopathy ED Results / Procedures / Treatments  Labs (all labs ordered are listed, but only abnormal results are displayed) Labs Reviewed   COMPREHENSIVE METABOLIC PANEL - Abnormal; Notable for the following components:      Result Value   Chloride 96 (*)    Glucose, Bld 170 (*)    BUN 34 (*)    Albumin 3.3 (*)    Alkaline Phosphatase 139 (*)    All other components within normal limits  CBC WITH DIFFERENTIAL/PLATELET - Abnormal; Notable for the following components:   WBC 14.4 (*)    Neutro Abs 12.2 (*)    Monocytes Absolute 1.3 (*)    Abs Immature Granulocytes 0.09 (*)    All other components within normal limits  BRAIN NATRIURETIC PEPTIDE - Abnormal; Notable for the following components:   B Natriuretic Peptide 154.8 (*)    All other components within normal limits  CBG MONITORING, ED - Abnormal; Notable for the following components:   Glucose-Capillary 408 (*)    All other components within normal limits  TROPONIN I (HIGH SENSITIVITY) - Abnormal; Notable for the following components:   Troponin I (High Sensitivity) 22 (*)    All other components within normal limits  SARS CORONAVIRUS 2 BY RT PCR  D-DIMER, QUANTITATIVE  PROTIME-INR  BASIC METABOLIC PANEL  CBC  TROPONIN I (HIGH SENSITIVITY)   EKG ED ECG REPORT I, Clent Jacks  Kenn Rekowski, the attending physician, personally viewed and interpreted this ECG. Date: 09/23/2023 EKG Time: 1535 Rate: 101 Rhythm: Tachycardic sinus rhythm QRS Axis: normal Intervals: normal ST/T Wave abnormalities: normal Narrative Interpretation: Tachycardic sinus rhythm.  No evidence of acute ischemia RADIOLOGY ED MD interpretation: 2 view chest x-ray shows continued presence of right midlung nodule concerning for neoplasm as well as mild central pulmonary vascular congestion bilaterally -Agree with radiology assessment Official radiology report(s): DG Chest 2 View  Result Date: 09/23/2023 CLINICAL DATA:  Shortness of breath. EXAM: CHEST - 2 VIEW COMPARISON:  June 07, 2023. FINDINGS: Stable cardiomediastinal silhouette. Mild central pulmonary vascular congestion is noted with possible  bilateral pulmonary edema. Possible mild right basilar atelectasis with small pleural effusion. Continued presence of right midlung nodule concerning for neoplasm. Bony thorax is unremarkable. IMPRESSION: Continued presence of right midlung nodule concerning for neoplasm. PET scan is recommended for further evaluation as originally recommended on CT scan of June 07, 2023. Mild central pulmonary vascular congestion is noted with possible bilateral pulmonary edema. Mild right basilar atelectasis is noted with small pleural effusion. Electronically Signed   By: Lupita Raider M.D.   On: 09/23/2023 17:35   PROCEDURES: Critical Care performed: Yes, see critical care procedure note(s) .1-3 Lead EKG Interpretation  Performed by: Merwyn Katos, MD Authorized by: Merwyn Katos, MD     Interpretation: abnormal     ECG rate:  111   ECG rate assessment: tachycardic     Rhythm: sinus tachycardia     Ectopy: none     Conduction: normal   CRITICAL CARE Performed by: Merwyn Katos  Total critical care time: 33 minutes  Critical care time was exclusive of separately billable procedures and treating other patients.  Critical care was necessary to treat or prevent imminent or life-threatening deterioration.  Critical care was time spent personally by me on the following activities: development of treatment plan with patient and/or surrogate as well as nursing, discussions with consultants, evaluation of patient's response to treatment, examination of patient, obtaining history from patient or surrogate, ordering and performing treatments and interventions, ordering and review of laboratory studies, ordering and review of radiographic studies, pulse oximetry and re-evaluation of patient's condition.  MEDICATIONS ORDERED IN ED: Medications  albuterol (PROVENTIL) (2.5 MG/3ML) 0.083% nebulizer solution 2.5 mg (has no administration in time range)  aspirin EC tablet 81 mg (has no administration in time  range)  nicotine (NICODERM CQ - dosed in mg/24 hr) patch 7 mg (has no administration in time range)  sertraline (ZOLOFT) tablet 50 mg (has no administration in time range)  levothyroxine (SYNTHROID) tablet 100 mcg (has no administration in time range)  loratadine (CLARITIN) tablet 10 mg (has no administration in time range)  dextromethorphan-guaiFENesin (MUCINEX DM) 30-600 MG per 12 hr tablet 1 tablet (has no administration in time range)  enoxaparin (LOVENOX) injection 40 mg (40 mg Subcutaneous Given 09/23/23 2153)  acetaminophen (TYLENOL) tablet 650 mg (has no administration in time range)    Or  acetaminophen (TYLENOL) suppository 650 mg (has no administration in time range)  ondansetron (ZOFRAN) tablet 4 mg (has no administration in time range)    Or  ondansetron (ZOFRAN) injection 4 mg (has no administration in time range)  insulin aspart (novoLOG) injection 0-20 Units (has no administration in time range)  insulin aspart (novoLOG) injection 0-5 Units (has no administration in time range)  methylPREDNISolone sodium succinate (SOLU-MEDROL) 40 mg/mL injection 40 mg (40 mg Intravenous Given 09/23/23 2151)  Followed by  predniSONE (DELTASONE) tablet 40 mg (has no administration in time range)  ipratropium-albuterol (DUONEB) 0.5-2.5 (3) MG/3ML nebulizer solution 3 mL (3 mLs Nebulization Given 09/23/23 2151)  furosemide (LASIX) injection 20 mg (20 mg Intravenous Not Given 09/23/23 2247)  furosemide (LASIX) injection 20 mg (20 mg Intravenous Given 09/23/23 2152)   IMPRESSION / MDM / ASSESSMENT AND PLAN / ED COURSE  I reviewed the triage vital signs and the nursing notes.                             The patient is on the cardiac monitor to evaluate for evidence of arrhythmia and/or significant heart rate changes. Patient's presentation is most consistent with acute presentation with potential threat to life or bodily function. The patient appears to be suffering from a moderate/severe  exacerbation of COPD.  Based on the history, exam, CXR/EKG reviewed by me, and further workup I don't suspect any other emergent cause of this presentation, such as pneumonia, acute coronary syndrome, congestive heart failure, pulmonary embolism, or pneumothorax.  ED Interventions: bronchodilators, steroids, antibiotics, reassess  Reassessment: After treatment, the patient's shortness of breath is improving but patient is still requiring supplemental oxygenation   Disposition: Admit   FINAL CLINICAL IMPRESSION(S) / ED DIAGNOSES   Final diagnoses:  Pharyngitis, unspecified etiology  SOB (shortness of breath)  Acute on chronic respiratory failure with hypoxia (HCC)   Rx / DC Orders   ED Discharge Orders          Ordered    azithromycin (ZITHROMAX Z-PAK) 250 MG tablet        09/23/23 1643    predniSONE (STERAPRED UNI-PAK 21 TAB) 10 MG (21) TBPK tablet        09/23/23 1643           Note:  This document was prepared using Dragon voice recognition software and may include unintentional dictation errors.   Merwyn Katos, MD 09/23/23 [phone]

## 2023-09-23 NOTE — Assessment & Plan Note (Addendum)
Pneumonia SIRS, possible sepsis Patient with cough, tachycardia, tachypnea, hypoxia leukocytosis and CTA chest concerning for pneumonia Scheduled and as needed DuoNebs IV steroids Antitussives, flutter valve and supplemental oxygen Follow respiratory viral panel Addendum: CTA chest shows patchy bilateral ground-glass and nodular opacities, right greate than left. Appearance is concerning for pneumonia. -Rocephin and azithromycin started

## 2023-09-23 NOTE — ED Triage Notes (Signed)
Pt arrived by EMS with CC of SOB onset of symptoms 1400, pt  called EMS stating she was losing her breath while getting dressed. Pt reports she is having a hard time swallowing, visual inspection throat is clear from any swelling. Airway is patent. Pt on 3L [place], O2 at 94%.

## 2023-09-23 NOTE — Assessment & Plan Note (Signed)
-  Nicotine patch 

## 2023-09-23 NOTE — Assessment & Plan Note (Addendum)
O2 sat 86 on home flow rate of 2 L, dropping to 83% with ambulation, requiring 5 L to maintain sats in the mid 90s Suspect COPD or viral URI as primary etiology but with some suspicion for CHF---> pneumonia on CTA chest Will get D-dimer and if elevated will get CTA to evaluate for PE. Continue supplemental oxygen Please see management for age specific etiology of the respective problem

## 2023-09-23 NOTE — Assessment & Plan Note (Signed)
Patient with shortness of breath, elevated BNP and chest x-ray with pulmonary vascular congestion Lasix 20 IV given in the ED Will continue Lasix Echocardiogram in the a.m. Daily weights with intake and output monitoring

## 2023-09-23 NOTE — H&P (Signed)
History and Physical    Patient: Michele House:[id number] DOB: 1955-11-03 DOA: 09/23/2023 DOS: the patient was seen and examined on 09/23/2023 PCP: [facility]  Patient coming from: Home  Chief Complaint:  Chief Complaint  Patient presents with   Shortness of Breath    HPI: Michele House is a 67 y.o. female with medical history significant for COPD on home O2 at 2 L, tobacco use disorder, HTN, DM, known pulmonary nodule 1.3 cm, discovered during a hospitalization in August 2024 for COPD, who presents to the ED with shortness of breath that started while getting dressed at around 1400 on 11/21.  She arrived via EMS on 3 L satting at 94%.  She denied chest pain, lower extremity pain or swelling.  Also denies cough, and subjective fevers. ED courseAnd data review: On arrival afebrile, tachycardic to 103 and tachypneic to 24 with O2 sat 86% on 3 L, improving to the low 90s on 5L.  BP 111/95. Labs: Troponin 13 and BNP 154 WBC 14,000.  Respiratory viral panel pending Other labs unremarkable EKG, personally viewed and interpreted showing sinus tachycardia at 101 with LAFB and nonspecific ST-T wave changes. Chest x-ray showed mild pulmonary vascular congestion with possible bilateral pulmonary edema and small right pleural effusion as well as known pulmonary nodule as further detailed below: IMPRESSION: Continued presence of right midlung nodule concerning for neoplasm. PET scan is recommended for further evaluation as originally recommended on CT scan of June 07, 2023.  Mild central pulmonary vascular congestion is noted with possible bilateral pulmonary edema. Mild right basilar atelectasis is noted with small pleural effusion.  Patient treated with albuterol and given a dose of IV lasix.   Hospitalist consulted for admission.  Stable.  Calling about 12 November 2020.  Creatinine 1 3 L.  Dr. Joice Lofts greater than even on 5 L 180   Review of Systems: As mentioned  in the history of present illness. All other systems reviewed and are negative.  Past Medical History:  Diagnosis Date   Acid reflux    COPD (chronic obstructive pulmonary disease) (HCC)    Diabetes mellitus without complication (HCC)    Hyperlipidemia    Hypertension    Thyroid disease    Past Surgical History:  Procedure Laterality Date   appendectomy     APPENDECTOMY     BREAST BIOPSY Right    CORE W/CLIP - NEG   TOTAL VAGINAL HYSTERECTOMY     TUMOR REMOVAL     benign;stomach   Social History:  reports that she quit smoking about 37 years ago. Her smoking use included cigarettes. She has never used smokeless tobacco. She reports that she does not drink alcohol and does not use drugs.  Allergies  Allergen Reactions   Penicillins Hives   Aspirin Other (See Comments)    Lips numb   Sulfa Antibiotics     Family History  Problem Relation Age of Onset   Heart attack Mother    Hypertension Mother    Breast cancer Sister 59   Breast cancer Maternal Aunt        40'S   Breast cancer Maternal Grandmother     Prior to Admission medications   Medication Sig Start Date End Date Taking? Authorizing Provider  azithromycin (ZITHROMAX Z-PAK) 250 MG tablet Take 2 tablets (500 mg) on  Day 1,  followed by 1 tablet (250 mg) once daily on Days 2 through 5. 09/23/23 09/28/23 Yes Bradler, Clent Jacks, MD  predniSONE (  STERAPRED UNI-PAK 21 TAB) 10 MG (21) TBPK tablet As directed on packaging 09/23/23  Yes Bradler, Clent Jacks, MD  aspirin EC 81 MG tablet Take 81 mg by mouth daily.    [provider]  b complex-C-folic acid 1 MG capsule Take 1 capsule by mouth daily.    [provider]  cetirizine (ZYRTEC) 10 MG tablet Take 10 mg by mouth daily.    [provider]  cholecalciferol (VITAMIN D3) 25 MCG (1000 UT) tablet Take 1,000 Units by mouth daily.    [provider]  dextromethorphan-guaiFENesin (MUCINEX DM) 30-600 MG 12hr tablet Take 1 tablet by mouth 2 (two)  times daily as needed for cough. 06/08/23   Arnetha Courser, MD  diclofenac sodium (VOLTAREN) 1 % GEL Apply topically 4 (four) times daily.    [provider]  glipiZIDE (GLUCOTROL XL) 2.5 MG 24 hr tablet Take 2.5 mg by mouth daily with breakfast.    [provider]  ipratropium (ATROVENT HFA) 17 MCG/ACT inhaler Inhale 2 puffs into the lungs every 6 (six) hours.    [provider]  levothyroxine (SYNTHROID, LEVOTHROID) 100 MCG tablet Take 100 mcg by mouth daily before breakfast.    [provider]  lisinopril-hydrochlorothiazide (PRINZIDE,ZESTORETIC) 20-25 MG per tablet Take 1 tablet by mouth daily.    [provider]  mometasone [facility]) 220 MCG/INH inhaler Inhale 2 puffs into the lungs daily.    [provider]  Multiple Vitamin (MULTIVITAMIN) capsule Take 1 capsule by mouth daily.    [provider]  naproxen (NAPROSYN) 500 MG tablet Take 500 mg by mouth 2 (two) times daily as needed.    [provider]  nicotine (NICODERM CQ - DOSED IN MG/24 HR) 7 mg/24hr patch Place 1 patch (7 mg total) onto the skin daily. 06/09/23   Arnetha Courser, MD  nitroGLYCERIN (NITROSTAT) 0.4 MG SL tablet Place 1 tablet (0.4 mg total) under the tongue every 5 (five) minutes as needed for chest pain. 10/12/13   Antonieta Iba, MD  sertraline (ZOLOFT) 50 MG tablet Take 50 mg by mouth daily.    [provider]    Physical Exam: Vitals:   09/23/23 1730 09/23/23 1800 09/23/23 1835 09/23/23 1900  BP: 126/71 119/71 (!) 158/71 (!) 125/92  Pulse: 92 92 (!) 110 (!) 101  Resp: 16 (!) 21 (!) 24 (!) 22  Temp:      TempSrc:      SpO2: 96% 95% (!) 83% 95%  Weight:      Height:       Physical Exam Vitals and nursing note reviewed.  Constitutional:      General: She is not in acute distress. HENT:     Head: Normocephalic and atraumatic.  Cardiovascular:     Rate and Rhythm: Regular rhythm. Tachycardia present.     Heart sounds: Normal heart  sounds.  Pulmonary:     Effort: Tachypnea present.     Breath sounds: Decreased air movement present.  Abdominal:     Palpations: Abdomen is soft.     Tenderness: There is no abdominal tenderness.  Neurological:     Mental Status: Mental status is at baseline.     Labs on Admission: I have personally reviewed following labs and imaging studies  CBC: Recent Labs  Lab 09/23/23 1543  WBC 14.4*  NEUTROABS 12.2*  HGB 12.5  HCT 38.3  MCV 88.5  PLT 264   Basic Metabolic Panel: Recent Labs  Lab 09/23/23 1543  NA  140  K 3.6  CL 96*  CO2 32  GLUCOSE 170*  BUN 34*  CREATININE 0.88  CALCIUM 10.2   GFR: Estimated Creatinine Clearance: 56.7 mL/min (by C-G formula based on SCr of 0.88 mg/dL). Liver Function Tests: Recent Labs  Lab 09/23/23 1543  AST 21  ALT 20  ALKPHOS 139*  BILITOT 0.6  PROT 7.2  ALBUMIN 3.3*   No results for input(s): "LIPASE", "AMYLASE" in the last 168 hours. No results for input(s): "AMMONIA" in the last 168 hours. Coagulation Profile: No results for input(s): "INR", "PROTIME" in the last 168 hours. Cardiac Enzymes: No results for input(s): "CKTOTAL", "CKMB", "CKMBINDEX", "TROPONINI" in the last 168 hours. BNP (last 3 results) No results for input(s): "PROBNP" in the last 8760 hours. HbA1C: No results for input(s): "HGBA1C" in the last 72 hours. CBG: No results for input(s): "GLUCAP" in the last 168 hours. Lipid Profile: No results for input(s): "CHOL", "HDL", "LDLCALC", "TRIG", "CHOLHDL", "LDLDIRECT" in the last 72 hours. Thyroid Function Tests: No results for input(s): "TSH", "T4TOTAL", "FREET4", "T3FREE", "THYROIDAB" in the last 72 hours. Anemia Panel: No results for input(s): "VITAMINB12", "FOLATE", "FERRITIN", "TIBC", "IRON", "RETICCTPCT" in the last 72 hours. Urine analysis:    Component Value Date/Time   COLORURINE YELLOW (A) 07/10/2015 1836   APPEARANCEUR CLEAR (A) 07/10/2015 1836   APPEARANCEUR Cloudy 08/31/2014 1528   LABSPEC  1.016 07/10/2015 1836   LABSPEC 1.015 08/31/2014 1528   PHURINE 7.0 07/10/2015 1836   GLUCOSEU NEGATIVE 07/10/2015 1836   GLUCOSEU Negative 08/31/2014 1528   HGBUR NEGATIVE 07/10/2015 1836   BILIRUBINUR NEGATIVE 07/10/2015 1836   BILIRUBINUR Negative 08/31/2014 1528   KETONESUR NEGATIVE 07/10/2015 1836   PROTEINUR NEGATIVE 07/10/2015 1836   NITRITE NEGATIVE 07/10/2015 1836   LEUKOCYTESUR NEGATIVE 07/10/2015 1836   LEUKOCYTESUR 3+ 08/31/2014 1528    Radiological Exams on Admission: DG Chest 2 View  Result Date: 09/23/2023 CLINICAL DATA:  Shortness of breath. EXAM: CHEST - 2 VIEW COMPARISON:  June 07, 2023. FINDINGS: Stable cardiomediastinal silhouette. Mild central pulmonary vascular congestion is noted with possible bilateral pulmonary edema. Possible mild right basilar atelectasis with small pleural effusion. Continued presence of right midlung nodule concerning for neoplasm. Bony thorax is unremarkable. IMPRESSION: Continued presence of right midlung nodule concerning for neoplasm. PET scan is recommended for further evaluation as originally recommended on CT scan of June 07, 2023. Mild central pulmonary vascular congestion is noted with possible bilateral pulmonary edema. Mild right basilar atelectasis is noted with small pleural effusion. Electronically Signed   By: Lupita Raider M.D.   On: 09/23/2023 17:35     Data Reviewed: Relevant notes from primary care and specialist visits, past discharge summaries as available in EHR, including Care Everywhere. Prior diagnostic testing as pertinent to current admission diagnoses Updated medications and problem lists for reconciliation ED course, including vitals, labs, imaging, treatment and response to treatment Triage notes, nursing and pharmacy notes and ED provider's notes Notable results as noted in HPI   Assessment and Plan: Acute on chronic respiratory failure with hypoxia (HCC) O2 sat 86 on home flow rate of 2 L, dropping to  83% with ambulation, requiring 5 L to maintain sats in the mid 90s Suspect COPD or viral URI as primary etiology but with some suspicion for CHF---> pneumonia on CTA chest Will get D-dimer and if elevated will get CTA to evaluate for PE. Continue supplemental oxygen Please see management for age specific etiology of the respective problem  Acute CHF (congestive heart  failure) [facility]) Patient with shortness of breath, elevated BNP and chest x-ray with pulmonary vascular congestion Lasix 20 IV given in the ED Will continue Lasix Echocardiogram in the a.m. Daily weights with intake and output monitoring  COPD with acute exacerbation (HCC) Pneumonia SIRS, possible sepsis Patient with cough, tachycardia, tachypnea, hypoxia leukocytosis and CTA chest concerning for pneumonia Scheduled and as needed DuoNebs IV steroids Antitussives, flutter valve and supplemental oxygen Follow respiratory viral panel Addendum: CTA chest shows patchy bilateral ground-glass and nodular opacities, right greate than left. Appearance is concerning for pneumonia. -Rocephin and azithromycin started  Pulmonary nodule 1 cm or greater in diameter Chest x-ray shows continued presence of nodule from August with recommendation for PET scan Will get D-dimer given sudden onset of symptoms and if elevated will get CTA chest Addendum: CTA chest IMPRESSION: No evidence of pulmonary embolus.  Patchy bilateral ground-glass and nodular opacities, right greater than left. Appearance is concerning for pneumonia.  Enlarging spiculated nodule in the inferior right upper lobe now 1.6 cm compared to 1.3 cm previously. Recommend further evaluation with PET CT.  Type 2 diabetes mellitus with obesity (HCC) Hold home oral hypoglycemics Sliding scale insulin coverage Continue aspirin  Essential hypertension Will hold home lisinopril HCTZ given initiation of Lasix and soft blood pressure on arrival Resume home meds when blood  pressure allows  Tobacco use disorder Nicotine patch        DVT prophylaxis: Lovenox  Consults: none  Advance Care Planning:   Code Status: Prior   Family Communication: none  Disposition Plan: Back to previous home environment  Severity of Illness: The appropriate patient status for this patient is OBSERVATION. Observation status is judged to be reasonable and necessary in order to provide the required intensity of service to ensure the patient's safety. The patient's presenting symptoms, physical exam findings, and initial radiographic and laboratory data in the context of their medical condition is felt to place them at decreased risk for further clinical deterioration. Furthermore, it is anticipated that the patient will be medically stable for discharge from the hospital within 2 midnights of admission.   Author: Andris Baumann, MD 09/23/2023 8:18 PM  For on call review [email or web address].

## 2023-09-23 NOTE — Assessment & Plan Note (Signed)
Will hold home lisinopril HCTZ given initiation of Lasix and soft blood pressure on arrival Resume home meds when blood pressure allows

## 2023-09-24 ENCOUNTER — Encounter: Payer: Self-pay | Admitting: Internal Medicine

## 2023-09-24 ENCOUNTER — Observation Stay (HOSPITAL_COMMUNITY)
Admit: 2023-09-24 | Discharge: 2023-09-24 | Disposition: A | Discharge status: Home / Self Care | Payer: 59 | Attending: Internal Medicine | Admitting: Internal Medicine

## 2023-09-24 ENCOUNTER — Observation Stay: Admit: 2023-09-24 | Payer: 59

## 2023-09-24 ENCOUNTER — Observation Stay: Payer: 59

## 2023-09-24 DIAGNOSIS — J9811 Atelectasis: Secondary | ICD-10-CM | POA: Diagnosis present

## 2023-09-24 DIAGNOSIS — J440 Chronic obstructive pulmonary disease with acute lower respiratory infection: Secondary | ICD-10-CM | POA: Diagnosis present

## 2023-09-24 DIAGNOSIS — F17210 Nicotine dependence, cigarettes, uncomplicated: Secondary | ICD-10-CM | POA: Diagnosis present

## 2023-09-24 DIAGNOSIS — Z880 Allergy status to penicillin: Secondary | ICD-10-CM | POA: Diagnosis not present

## 2023-09-24 DIAGNOSIS — E1169 Type 2 diabetes mellitus with other specified complication: Secondary | ICD-10-CM | POA: Diagnosis not present

## 2023-09-24 DIAGNOSIS — J441 Chronic obstructive pulmonary disease with (acute) exacerbation: Secondary | ICD-10-CM | POA: Diagnosis present

## 2023-09-24 DIAGNOSIS — J189 Pneumonia, unspecified organism: Secondary | ICD-10-CM

## 2023-09-24 DIAGNOSIS — I110 Hypertensive heart disease with heart failure: Secondary | ICD-10-CM | POA: Diagnosis present

## 2023-09-24 DIAGNOSIS — Z803 Family history of malignant neoplasm of breast: Secondary | ICD-10-CM | POA: Diagnosis not present

## 2023-09-24 DIAGNOSIS — I10 Essential (primary) hypertension: Secondary | ICD-10-CM

## 2023-09-24 DIAGNOSIS — Z886 Allergy status to analgesic agent status: Secondary | ICD-10-CM | POA: Diagnosis not present

## 2023-09-24 DIAGNOSIS — Z882 Allergy status to sulfonamides status: Secondary | ICD-10-CM | POA: Diagnosis not present

## 2023-09-24 DIAGNOSIS — J9621 Acute and chronic respiratory failure with hypoxia: Secondary | ICD-10-CM | POA: Diagnosis present

## 2023-09-24 DIAGNOSIS — E669 Obesity, unspecified: Secondary | ICD-10-CM

## 2023-09-24 DIAGNOSIS — F17200 Nicotine dependence, unspecified, uncomplicated: Secondary | ICD-10-CM

## 2023-09-24 DIAGNOSIS — Z79890 Hormone replacement therapy (postmenopausal): Secondary | ICD-10-CM | POA: Diagnosis not present

## 2023-09-24 DIAGNOSIS — R0602 Shortness of breath: Secondary | ICD-10-CM | POA: Diagnosis present

## 2023-09-24 DIAGNOSIS — I5031 Acute diastolic (congestive) heart failure: Secondary | ICD-10-CM

## 2023-09-24 DIAGNOSIS — E1165 Type 2 diabetes mellitus with hyperglycemia: Secondary | ICD-10-CM | POA: Diagnosis present

## 2023-09-24 DIAGNOSIS — Z7984 Long term (current) use of oral hypoglycemic drugs: Secondary | ICD-10-CM | POA: Diagnosis not present

## 2023-09-24 DIAGNOSIS — E785 Hyperlipidemia, unspecified: Secondary | ICD-10-CM | POA: Diagnosis present

## 2023-09-24 DIAGNOSIS — Z8249 Family history of ischemic heart disease and other diseases of the circulatory system: Secondary | ICD-10-CM | POA: Diagnosis not present

## 2023-09-24 DIAGNOSIS — R911 Solitary pulmonary nodule: Secondary | ICD-10-CM | POA: Diagnosis present

## 2023-09-24 DIAGNOSIS — J449 Chronic obstructive pulmonary disease, unspecified: Secondary | ICD-10-CM | POA: Diagnosis present

## 2023-09-24 DIAGNOSIS — Z1152 Encounter for screening for COVID-19: Secondary | ICD-10-CM | POA: Diagnosis not present

## 2023-09-24 DIAGNOSIS — Z9981 Dependence on supplemental oxygen: Secondary | ICD-10-CM | POA: Diagnosis not present

## 2023-09-24 DIAGNOSIS — Z79899 Other long term (current) drug therapy: Secondary | ICD-10-CM | POA: Diagnosis not present

## 2023-09-24 DIAGNOSIS — K219 Gastro-esophageal reflux disease without esophagitis: Secondary | ICD-10-CM | POA: Diagnosis present

## 2023-09-24 DIAGNOSIS — Z7982 Long term (current) use of aspirin: Secondary | ICD-10-CM | POA: Diagnosis not present

## 2023-09-24 LAB — BASIC METABOLIC PANEL
Anion gap: 12 (ref 5–15)
BUN: 36 mg/dL — ABNORMAL HIGH (ref 8–23)
CO2: 30 mmol/L (ref 22–32)
Calcium: 10 mg/dL (ref 8.9–10.3)
Chloride: 96 mmol/L — ABNORMAL LOW (ref 98–111)
Creatinine, Ser: 0.96 mg/dL (ref 0.44–1.00)
GFR, Estimated: >60 mL/min (ref >60)
Glucose, Bld: 135 mg/dL — ABNORMAL HIGH (ref 70–99)
Potassium: 3.8 mmol/L (ref 3.5–5.1)
Sodium: 138 mmol/L (ref 135–145)

## 2023-09-24 LAB — ECHOCARDIOGRAM COMPLETE
Height: 61 in
S' Lateral: 2.4 cm
Weight: 2720 [oz_av]

## 2023-09-24 LAB — CBC
HCT: 35.8 % — ABNORMAL LOW (ref 36.0–46.0)
Hemoglobin: 11.9 g/dL — ABNORMAL LOW (ref 12.0–15.0)
MCH: 29.4 pg (ref 26.0–34.0)
MCHC: 33.2 g/dL (ref 30.0–36.0)
MCV: 88.4 fL (ref 80.0–100.0)
Platelets: 270 10*3/uL (ref 150–400)
RBC: 4.05 MIL/uL (ref 3.87–5.11)
RDW: 13.6 % (ref 11.5–15.5)
WBC: 12.2 10*3/uL — ABNORMAL HIGH (ref 4.0–10.5)
nRBC: 0 % (ref 0.0–0.2)

## 2023-09-24 LAB — CBG MONITORING, ED
Glucose-Capillary: 224 mg/dL — ABNORMAL HIGH (ref 70–99)
Glucose-Capillary: 145 mg/dL — ABNORMAL HIGH (ref 70–99)
Glucose-Capillary: 402 mg/dL — ABNORMAL HIGH (ref 70–99)
Glucose-Capillary: 269 mg/dL — ABNORMAL HIGH (ref 70–99)
Glucose-Capillary: 139 mg/dL — ABNORMAL HIGH (ref 70–99)

## 2023-09-24 MED ORDER — IOHEXOL 350 MG/ML SOLN
100.0000 mL | Freq: Once | INTRAVENOUS | Status: AC | PRN
  Administered 2023-09-24: 75 mL via INTRAVENOUS

## 2023-09-24 MED ORDER — LINAGLIPTIN 5 MG PO TABS
5.0000 mg | ORAL_TABLET | Freq: Every day | ORAL | Status: DC
  Administered 2023-09-25 – 2023-09-26 (×2): 5 mg via ORAL
  Filled 2023-09-24 (×2): qty 1

## 2023-09-24 MED ORDER — LISINOPRIL 20 MG PO TABS
20.0000 mg | ORAL_TABLET | Freq: Every day | ORAL | Status: DC
  Administered 2023-09-25 – 2023-09-26 (×2): 20 mg via ORAL
  Filled 2023-09-24 (×2): qty 1

## 2023-09-24 MED ORDER — LISINOPRIL-HYDROCHLOROTHIAZIDE 20-25 MG PO TABS: 1.0000 | ORAL_TABLET | Freq: Every day | ORAL | Status: DC

## 2023-09-24 MED ORDER — ADULT MULTIVITAMIN W/MINERALS CH
1.0000 | ORAL_TABLET | Freq: Every day | ORAL | Status: DC
  Administered 2023-09-25 – 2023-09-26 (×2): 1 via ORAL
  Filled 2023-09-24 (×2): qty 1

## 2023-09-24 MED ORDER — GLIPIZIDE 10 MG PO TABS
10.0000 mg | ORAL_TABLET | Freq: Every day | ORAL | Status: DC
  Administered 2023-09-25 – 2023-09-26 (×2): 10 mg via ORAL
  Filled 2023-09-24 (×2): qty 1

## 2023-09-24 MED ORDER — INSULIN ASPART 100 UNIT/ML IJ SOLN
20.0000 [IU] | Freq: Once | INTRAMUSCULAR | Status: AC
  Administered 2023-09-24: 20 [IU] via SUBCUTANEOUS
  Filled 2023-09-24: qty 1

## 2023-09-24 MED ORDER — ATORVASTATIN CALCIUM 20 MG PO TABS
20.0000 mg | ORAL_TABLET | Freq: Every day | ORAL | Status: DC
  Administered 2023-09-25 – 2023-09-26 (×2): 20 mg via ORAL
  Filled 2023-09-24 (×2): qty 1

## 2023-09-24 MED ORDER — HYDROCHLOROTHIAZIDE 25 MG PO TABS
25.0000 mg | ORAL_TABLET | Freq: Every day | ORAL | Status: DC
  Administered 2023-09-25 – 2023-09-26 (×2): 25 mg via ORAL
  Filled 2023-09-24 (×2): qty 1

## 2023-09-24 MED ORDER — PREGABALIN 50 MG PO CAPS
50.0000 mg | ORAL_CAPSULE | Freq: Two times a day (BID) | ORAL | Status: DC
  Administered 2023-09-24 – 2023-09-26 (×4): 50 mg via ORAL
  Filled 2023-09-24 (×4): qty 1

## 2023-09-24 MED ORDER — SERTRALINE HCL 50 MG PO TABS: 100.0000 mg | ORAL_TABLET | Freq: Every day | ORAL | Status: DC

## 2023-09-24 MED ORDER — VITAMIN D 25 MCG (1000 UNIT) PO TABS
1000.0000 [IU] | ORAL_TABLET | Freq: Every day | ORAL | Status: DC
  Administered 2023-09-25 – 2023-09-26 (×2): 1000 [IU] via ORAL
  Filled 2023-09-24 (×2): qty 1

## 2023-09-24 NOTE — ED Notes (Signed)
MD Allena Katz notified of POC BGL of 402.

## 2023-09-24 NOTE — ED Notes (Addendum)
Lung sounds clear bilaterally, pt reports cough is improved after nebulizer treatment.

## 2023-09-24 NOTE — TOC Initial Note (Addendum)
Transition of Care [facility]) - Initial/Assessment Note    Patient Details  Name: Michele House MRN: [id number] Date of Birth: August 10, 1956  [facility]) CM/SW Contact:    Marquita Palms, LCSW Phone Number: 09/24/2023, 12:10 PM  Clinical Narrative:                  CSW aware of TOC consult for SNF placement. Awaiting PT and OT evaluation. Patient reported that she needs to go to a skilled nursing facility, long term care.      Patient Goals and CMS Choice            Expected Discharge Plan and Services                                              Prior Living Arrangements/Services                       Activities of Daily Living   ADL Screening (condition at time of admission) Independently performs ADLs?: Yes (appropriate for developmental age) Is the patient deaf or have difficulty hearing?: No Does the patient have difficulty seeing, even when wearing glasses/contacts?: No Does the patient have difficulty concentrating, remembering, or making decisions?: No  Permission Sought/Granted                  Emotional Assessment              Admission diagnosis:  COPD with acute exacerbation (HCC) [J44.1] Patient Active Problem List   Diagnosis Date Noted   Pneumonia 09/24/2023   Acute CHF (congestive heart failure) (HCC) 09/23/2023   COPD with acute exacerbation (HCC) 06/07/2023   Acute on chronic respiratory failure with hypoxia (HCC) 06/07/2023   Pulmonary nodule 1 cm or greater in diameter 06/07/2023   Allergic reaction 06/07/2023   Chest discomfort 10/12/2013   Tobacco use disorder 10/12/2013   Essential hypertension 10/12/2013   Obesity 10/12/2013   Type 2 diabetes mellitus with obesity (HCC) 10/12/2013   PCP:  [employer] Pharmacy:   [facility] - [place], [place] - [address] [place] [address] Phone: [phone] Fax: [phone]  [facility]  [address] [place] - [address] [place] [address] Phone: [phone] Fax: [phone]     Social Determinants of Health (SDOH) Social History: SDOH Screenings   Food Insecurity: Food Insecurity Present (09/24/2023)  Housing: Low Risk  (09/24/2023)  Transportation Needs: Unmet Transportation Needs (09/24/2023)  Utilities: Not At Risk (09/24/2023)  Tobacco Use: Medium Risk (09/24/2023)   SDOH Interventions:     Readmission Risk Interventions     No data to display

## 2023-09-24 NOTE — Progress Notes (Signed)
[facility]  - [place] at [facility]   PATIENT NAME: Michele House    MR#:  [id number]  DATE OF BIRTH:  Sep 25, 1956  SUBJECTIVE:  no family at bedside. Patient lives with son. Has history of heavy smoking along with son also smoking at home. Uses 4 L nasal cannula oxygen came in with increasing shortness of breath. Becomes tearful when I discussed about smoking cessation. She knows she has a pulmonary nodules that needs to be followed up. No fever. Chronic cough with chronic phlegm.    VITALS:  Blood pressure 119/63, pulse (!) 101, temperature 97.9 F (36.6 C), temperature source Oral, resp. rate 20, height 5\' 1"  (1.549 m), weight 77.1 kg, last menstrual period 12/24/1998, SpO2 99%.  PHYSICAL EXAMINATION:   GENERAL:  67 y.o.-year-old patient with no acute distress. Chronically ill, morbidly obese LUNGS: distant breath sounds bilaterally, no wheezing CARDIOVASCULAR: S1, S2 normal. No murmur   ABDOMEN: Soft, nontender, nondistended. Bowel sounds present.  EXTREMITIES: No  edema b/l.    NEUROLOGIC: nonfocal  patient is alert and awake    LABORATORY PANEL:  CBC Recent Labs  Lab 09/24/23 0538  WBC 12.2*  HGB 11.9*  HCT 35.8*  PLT 270    Chemistries  Recent Labs  Lab 09/23/23 1543 09/24/23 0538  NA 140 138  K 3.6 3.8  CL 96* 96*  CO2 32 30  GLUCOSE 170* 135*  BUN 34* 36*  CREATININE 0.88 0.96  CALCIUM 10.2 10.0  AST 21  --   ALT 20  --   ALKPHOS 139*  --   BILITOT 0.6  --    Cardiac Enzymes No results for input(s): "TROPONINI" in the last 168 hours. RADIOLOGY:  ECHOCARDIOGRAM COMPLETE  Result Date: 09/24/2023    ECHOCARDIOGRAM REPORT   Patient Name:   KAIDA MARO [facility] Date of Exam: 09/24/2023 Medical Rec #:  [id number]         Height:       61.0 in Accession #:    [id number]        Weight:       170.0 lb Date of Birth:  07/31/1956         BSA:          1.763 m Patient Age:    67 years          BP:           137/78 mmHg Patient Gender: F                  HR:           96 bpm. Exam Location:  [facility] Procedure: 2D Echo, Cardiac Doppler and Color Doppler Indications:     CHF--acute diastolic I50.31  History:         Patient has no prior history of Echocardiogram examinations.                  COPD; Risk Factors:Diabetes and Hypertension.  Sonographer:     Cristela Blue Referring Phys:  [id number] Andris Baumann Diagnosing Phys: Debbe Odea MD  Sonographer Comments: Technically challenging study due to limited acoustic windows and no apical window. Image acquisition challenging due to COPD. IMPRESSIONS  1. Left ventricular ejection fraction, by estimation, is 55 to 60%. The left ventricle has normal function. The left ventricle has no regional wall motion abnormalities. Left ventricular diastolic parameters are indeterminate.  2. Right ventricular systolic function is normal. The right ventricular size is normal.  3.  The mitral valve is normal in structure. No evidence of mitral valve regurgitation.  4. The aortic valve is grossly normal. Aortic valve regurgitation is not visualized.  5. The inferior vena cava is normal in size with greater than 50% respiratory variability, suggesting right atrial pressure of 3 mmHg. FINDINGS  Left Ventricle: Left ventricular ejection fraction, by estimation, is 55 to 60%. The left ventricle has normal function. The left ventricle has no regional wall motion abnormalities. The left ventricular internal cavity size was normal in size. There is  no left ventricular hypertrophy. Left ventricular diastolic parameters are indeterminate. Right Ventricle: The right ventricular size is normal. No increase in right ventricular wall thickness. Right ventricular systolic function is normal. Left Atrium: Left atrial size was normal in size. Right Atrium: Right atrial size was normal in size. Pericardium: There is no evidence of pericardial effusion. Mitral Valve: The mitral valve is normal in structure. No evidence of mitral valve  regurgitation. Tricuspid Valve: The tricuspid valve is normal in structure. Tricuspid valve regurgitation is trivial. Aortic Valve: The aortic valve is grossly normal. Aortic valve regurgitation is not visualized. Pulmonic Valve: The pulmonic valve was normal in structure. Pulmonic valve regurgitation is not visualized. Aorta: The aortic root is normal in size and structure. Venous: The inferior vena cava is normal in size with greater than 50% respiratory variability, suggesting right atrial pressure of 3 mmHg. IAS/Shunts: No atrial level shunt detected by color flow Doppler.  LEFT VENTRICLE PLAX 2D LVIDd:         3.60 cm LVIDs:         2.40 cm LV PW:         1.30 cm LV IVS:        0.90 cm LVOT diam:     2.00 cm LVOT Area:     3.14 cm  LEFT ATRIUM         Index LA diam:    3.20 cm 1.82 cm/m   AORTA Ao Root diam: 2.50 cm TRICUSPID VALVE TR Peak grad:   7.8 mmHg TR Vmax:        140.00 cm/s  SHUNTS Systemic Diam: 2.00 cm Debbe Odea MD Electronically signed by Debbe Odea MD Signature Date/Time: 09/24/2023/11:57:16 AM    Final    CT Angio Chest Pulmonary Embolism (PE) W or WO Contrast  Result Date: 09/24/2023 CLINICAL DATA:  Shortness of breath EXAM: CT ANGIOGRAPHY CHEST WITH CONTRAST TECHNIQUE: Multidetector CT imaging of the chest was performed using the standard protocol during bolus administration of intravenous contrast. Multiplanar CT image reconstructions and MIPs were obtained to evaluate the vascular anatomy. RADIATION DOSE REDUCTION: This exam was performed according to the departmental dose-optimization program which includes automated exposure control, adjustment of the mA and/or kV according to patient size and/or use of iterative reconstruction technique. CONTRAST:  75mL OMNIPAQUE IOHEXOL 350 MG/ML SOLN COMPARISON:  06/07/2023 FINDINGS: Cardiovascular: No filling defects in the pulmonary arteries to suggest pulmonary emboli. Heart is normal size. Aorta is normal caliber. Scattered  coronary artery and aortic calcifications. Mediastinum/Nodes: No mediastinal, hilar, or axillary adenopathy. Trachea and esophagus are unremarkable. Thyroid unremarkable. Lungs/Pleura: Suspicious spiculated appearing pulmonary nodule in the inferior right upper lobe measures 1.6 cm currently compared to 1.3 cm previously. Peripheral ground-glass nodular and airspace opacities in the right upper lobe and right lower lobe as well as left lower lobe and lingula. No effusions. Upper Abdomen: No acute findings Musculoskeletal: Chest wall soft tissues are unremarkable. No acute bony abnormality. Review of  the MIP images confirms the above findings. IMPRESSION: No evidence of pulmonary embolus. Patchy bilateral ground-glass and nodular opacities, right greater than left. Appearance is concerning for pneumonia. Enlarging spiculated nodule in the inferior right upper lobe now 1.6 cm compared to 1.3 cm previously. Recommend further evaluation with PET CT. Coronary artery disease. Aortic Atherosclerosis (ICD10-I70.0). Electronically Signed   By: Charlett Nose M.D.   On: 09/24/2023 01:45   DG Chest 2 View  Result Date: 09/23/2023 CLINICAL DATA:  Shortness of breath. EXAM: CHEST - 2 VIEW COMPARISON:  June 07, 2023. FINDINGS: Stable cardiomediastinal silhouette. Mild central pulmonary vascular congestion is noted with possible bilateral pulmonary edema. Possible mild right basilar atelectasis with small pleural effusion. Continued presence of right midlung nodule concerning for neoplasm. Bony thorax is unremarkable. IMPRESSION: Continued presence of right midlung nodule concerning for neoplasm. PET scan is recommended for further evaluation as originally recommended on CT scan of June 07, 2023. Mild central pulmonary vascular congestion is noted with possible bilateral pulmonary edema. Mild right basilar atelectasis is noted with small pleural effusion. Electronically Signed   By: Lupita Raider M.D.   On: 09/23/2023 17:35     Assessment and Plan   QUINYA ZAUNBRECHER is a 67 y.o. female with medical history significant for COPD on home O2 at 2 L, tobacco use disorder, HTN, DM, known pulmonary nodule 1.3 cm, discovered during a hospitalization in August 2024 for COPD, who presents to the ED with shortness of breath that started while getting dressed at around 1400 on 11/21    Chest x-ray showed mild pulmonary vascular congestion with possible bilateral pulmonary edema a CT chest  Continued presence of right midlung nodule concerning for neoplasm. PET scan is recommended for further evaluation as originally recommended on CT scan of June 07, 2023.  Mild central pulmonary vascular congestion is noted with possible bilateral pulmonary edema. Mild right basilar atelectasis is noted with small pleural effusion.  Acute on chronic hypoxic respiratory failure secondary to COPD exacerbation acute diastolic congestive heart failure mild improved  -- patient received IV steroids-- change to PO steroid -- check Pro calcitonin. Hold off steroids for now. Patient has chronic cough -- wean oxygen as tolerated keep sets greater than 90% -- no indication for daily Lasix will give as needed -- continue PRN nebs and inhalers -- will prescribe nebulizer for home  Generalized weakness -- PT to see patient -- patient does not do much at home given her chronic respiratory failure -- TOC for discharge planning  Known pulmonary nodule more than 1 cm no noted on CT chest -- patient follows with pulmonary Dr. Karna Christmas @[facility] -- I have discussed with her CT results and recommend her to follow-up closely  Heavy tobacco abuse -- discussed cessation  Type II diabetes with hyperglycemia in the setting of steroids morbid obesity -- sliding scale insulin -- resume home meds Tradjenta, glipizide  Hypertension -- resume lisinopril/hydrochlorothiazide   Procedures: Family communication : none at bedside Consults  : CODE STATUS: full DVT Prophylaxis : enoxaparin Level of care: Progressive Status is: Inpatient Remains inpatient appropriate because: COPD exacerbation    TOTAL TIME TAKING CARE OF THIS PATIENT: 35 minutes.  >50% time spent on counselling and coordination of care  Note: This dictation was prepared with Dragon dictation along with smaller phrase technology. Any transcriptional errors that result from this process are unintentional.  Enedina Finner M.D    [facility]   CC: Primary care physician; [employer]  [facility]

## 2023-09-24 NOTE — Progress Notes (Signed)
*  PRELIMINARY RESULTS* Echocardiogram 2D Echocardiogram has been performed.  Cristela Blue 09/24/2023, 7:57 AM

## 2023-09-24 NOTE — ED Notes (Signed)
Informed RN [place] via chat/ Pt has bed assigned (231A)

## 2023-09-25 ENCOUNTER — Encounter: Payer: Self-pay | Admitting: Internal Medicine

## 2023-09-25 DIAGNOSIS — F17200 Nicotine dependence, unspecified, uncomplicated: Secondary | ICD-10-CM | POA: Diagnosis not present

## 2023-09-25 DIAGNOSIS — J9621 Acute and chronic respiratory failure with hypoxia: Secondary | ICD-10-CM | POA: Diagnosis not present

## 2023-09-25 DIAGNOSIS — E1169 Type 2 diabetes mellitus with other specified complication: Secondary | ICD-10-CM | POA: Diagnosis not present

## 2023-09-25 DIAGNOSIS — J441 Chronic obstructive pulmonary disease with (acute) exacerbation: Secondary | ICD-10-CM | POA: Diagnosis not present

## 2023-09-25 LAB — PROCALCITONIN: Procalcitonin: 0.19 ng/mL

## 2023-09-25 LAB — GLUCOSE, CAPILLARY
Glucose-Capillary: 198 mg/dL — ABNORMAL HIGH (ref 70–99)
Glucose-Capillary: 276 mg/dL — ABNORMAL HIGH (ref 70–99)
Glucose-Capillary: 242 mg/dL — ABNORMAL HIGH (ref 70–99)
Glucose-Capillary: 457 mg/dL — ABNORMAL HIGH (ref 70–99)

## 2023-09-25 LAB — GLUCOSE, RANDOM: Glucose, Bld: 451 mg/dL — ABNORMAL HIGH (ref 70–99)

## 2023-09-25 MED ORDER — FUROSEMIDE 20 MG PO TABS
20.0000 mg | ORAL_TABLET | Freq: Every day | ORAL | Status: DC
  Administered 2023-09-25 – 2023-09-26 (×2): 20 mg via ORAL
  Filled 2023-09-25 (×2): qty 1

## 2023-09-25 MED ORDER — INSULIN ASPART 100 UNIT/ML IJ SOLN
20.0000 [IU] | Freq: Once | INTRAMUSCULAR | Status: AC
  Administered 2023-09-25: 20 [IU] via SUBCUTANEOUS
  Filled 2023-09-25: qty 1

## 2023-09-25 MED ORDER — IPRATROPIUM-ALBUTEROL 0.5-2.5 (3) MG/3ML IN SOLN
3.0000 mL | Freq: Three times a day (TID) | RESPIRATORY_TRACT | Status: DC
  Administered 2023-09-25 – 2023-09-26 (×3): 3 mL via RESPIRATORY_TRACT
  Filled 2023-09-25 (×3): qty 3

## 2023-09-25 NOTE — Plan of Care (Signed)
  Problem: Education: Goal: Ability to describe self-care measures that may prevent or decrease complications (Diabetes Survival Skills Education) will improve Outcome: Progressing Goal: Individualized Educational Video(s) Outcome: Progressing   Problem: Coping: Goal: Ability to adjust to condition or change in health will improve Outcome: Progressing   Problem: Fluid Volume: Goal: Ability to maintain a balanced intake and output will improve Outcome: Progressing   Problem: Health Behavior/Discharge Planning: Goal: Ability to identify and utilize available resources and services will improve Outcome: Progressing Goal: Ability to manage health-related needs will improve Outcome: Progressing   Problem: Metabolic: Goal: Ability to maintain appropriate glucose levels will improve Outcome: Progressing   Problem: Nutritional: Goal: Maintenance of adequate nutrition will improve Outcome: Progressing Goal: Progress toward achieving an optimal weight will improve Outcome: Progressing   Problem: Skin Integrity: Goal: Risk for impaired skin integrity will decrease Outcome: Progressing   Problem: Tissue Perfusion: Goal: Adequacy of tissue perfusion will improve Outcome: Progressing   Problem: Education: Goal: Knowledge of General Education information will improve Description: Including pain rating scale, medication(s)/side effects and non-pharmacologic comfort measures Outcome: Progressing   Problem: Health Behavior/Discharge Planning: Goal: Ability to manage health-related needs will improve Outcome: Progressing   Problem: Clinical Measurements: Goal: Ability to maintain clinical measurements within normal limits will improve Outcome: Progressing Goal: Will remain free from infection Outcome: Progressing Goal: Diagnostic test results will improve Outcome: Progressing Goal: Respiratory complications will improve Outcome: Progressing Goal: Cardiovascular complication will  be avoided Outcome: Progressing   Problem: Activity: Goal: Risk for activity intolerance will decrease Outcome: Progressing   Problem: Nutrition: Goal: Adequate nutrition will be maintained Outcome: Progressing   Problem: Coping: Goal: Level of anxiety will decrease Outcome: Progressing   Problem: Elimination: Goal: Will not experience complications related to bowel motility Outcome: Progressing Goal: Will not experience complications related to urinary retention Outcome: Progressing   Problem: Pain Management: Goal: General experience of comfort will improve Outcome: Progressing   Problem: Safety: Goal: Ability to remain free from injury will improve Outcome: Progressing   Problem: Skin Integrity: Goal: Risk for impaired skin integrity will decrease Outcome: Progressing   Problem: Education: Goal: Ability to demonstrate management of disease process will improve Outcome: Progressing Goal: Ability to verbalize understanding of medication therapies will improve Outcome: Progressing Goal: Individualized Educational Video(s) Outcome: Progressing   Problem: Activity: Goal: Capacity to carry out activities will improve Outcome: Progressing   Problem: Cardiac: Goal: Ability to achieve and maintain adequate cardiopulmonary perfusion will improve Outcome: Progressing   Problem: Education: Goal: Knowledge of disease or condition will improve Outcome: Progressing Goal: Knowledge of the prescribed therapeutic regimen will improve Outcome: Progressing Goal: Individualized Educational Video(s) Outcome: Progressing   Problem: Activity: Goal: Ability to tolerate increased activity will improve Outcome: Progressing Goal: Will verbalize the importance of balancing activity with adequate rest periods Outcome: Progressing   Problem: Respiratory: Goal: Ability to maintain a clear airway will improve Outcome: Progressing Goal: Levels of oxygenation will improve Outcome:  Progressing Goal: Ability to maintain adequate ventilation will improve Outcome: Progressing

## 2023-09-25 NOTE — Progress Notes (Signed)
[facility]  - [place] at [facility]   PATIENT NAME: Michele House    MR#:  [id number]  DATE OF BIRTH:  1955/12/01  SUBJECTIVE:  no family at bedside. Overall feels a lot better good urine output with IV Lasix. No respiratory distress. Tolerating PO diet  VITALS:  Blood pressure 111/70, pulse 87, temperature 97.6 F (36.4 C), temperature source Oral, resp. rate 16, height 5\' 1"  (1.549 m), weight 75.8 kg, last menstrual period 12/24/1998, SpO2 96%.  PHYSICAL EXAMINATION:   GENERAL:  67 y.o.-year-old patient with no acute distress. Chronically ill, morbidly obese LUNGS: distant breath sounds bilaterally, no wheezing CARDIOVASCULAR: S1, S2 normal. No murmur   ABDOMEN: Soft, nontender, nondistended. Bowel sounds present.  EXTREMITIES: No  edema b/l.    NEUROLOGIC: nonfocal  patient is alert and awake    LABORATORY PANEL:  CBC Recent Labs  Lab 09/24/23 0538  WBC 12.2*  HGB 11.9*  HCT 35.8*  PLT 270    Chemistries  Recent Labs  Lab 09/23/23 1543 09/24/23 0538  NA 140 138  K 3.6 3.8  CL 96* 96*  CO2 32 30  GLUCOSE 170* 135*  BUN 34* 36*  CREATININE 0.88 0.96  CALCIUM 10.2 10.0  AST 21  --   ALT 20  --   ALKPHOS 139*  --   BILITOT 0.6  --    Cardiac Enzymes No results for input(s): "TROPONINI" in the last 168 hours. RADIOLOGY:  ECHOCARDIOGRAM COMPLETE  Result Date: 09/24/2023    ECHOCARDIOGRAM REPORT   Patient Name:   Michele House [facility] Date of Exam: 09/24/2023 Medical Rec #:  [id number]         Height:       61.0 in Accession #:    [id number]        Weight:       170.0 lb Date of Birth:  05/25/1956         BSA:          1.763 m Patient Age:    67 years          BP:           137/78 mmHg Patient Gender: F                 HR:           96 bpm. Exam Location:  [facility] Procedure: 2D Echo, Cardiac Doppler and Color Doppler Indications:     CHF--acute diastolic I50.31  History:         Patient has no prior history of Echocardiogram examinations.                   COPD; Risk Factors:Diabetes and Hypertension.  Sonographer:     Cristela Blue Referring Phys:  [id number] Andris Baumann Diagnosing Phys: Debbe Odea MD  Sonographer Comments: Technically challenging study due to limited acoustic windows and no apical window. Image acquisition challenging due to COPD. IMPRESSIONS  1. Left ventricular ejection fraction, by estimation, is 55 to 60%. The left ventricle has normal function. The left ventricle has no regional wall motion abnormalities. Left ventricular diastolic parameters are indeterminate.  2. Right ventricular systolic function is normal. The right ventricular size is normal.  3. The mitral valve is normal in structure. No evidence of mitral valve regurgitation.  4. The aortic valve is grossly normal. Aortic valve regurgitation is not visualized.  5. The inferior vena cava is normal in size with greater than 50% respiratory variability,  suggesting right atrial pressure of 3 mmHg. FINDINGS  Left Ventricle: Left ventricular ejection fraction, by estimation, is 55 to 60%. The left ventricle has normal function. The left ventricle has no regional wall motion abnormalities. The left ventricular internal cavity size was normal in size. There is  no left ventricular hypertrophy. Left ventricular diastolic parameters are indeterminate. Right Ventricle: The right ventricular size is normal. No increase in right ventricular wall thickness. Right ventricular systolic function is normal. Left Atrium: Left atrial size was normal in size. Right Atrium: Right atrial size was normal in size. Pericardium: There is no evidence of pericardial effusion. Mitral Valve: The mitral valve is normal in structure. No evidence of mitral valve regurgitation. Tricuspid Valve: The tricuspid valve is normal in structure. Tricuspid valve regurgitation is trivial. Aortic Valve: The aortic valve is grossly normal. Aortic valve regurgitation is not visualized. Pulmonic Valve: The pulmonic valve  was normal in structure. Pulmonic valve regurgitation is not visualized. Aorta: The aortic root is normal in size and structure. Venous: The inferior vena cava is normal in size with greater than 50% respiratory variability, suggesting right atrial pressure of 3 mmHg. IAS/Shunts: No atrial level shunt detected by color flow Doppler.  LEFT VENTRICLE PLAX 2D LVIDd:         3.60 cm LVIDs:         2.40 cm LV PW:         1.30 cm LV IVS:        0.90 cm LVOT diam:     2.00 cm LVOT Area:     3.14 cm  LEFT ATRIUM         Index LA diam:    3.20 cm 1.82 cm/m   AORTA Ao Root diam: 2.50 cm TRICUSPID VALVE TR Peak grad:   7.8 mmHg TR Vmax:        140.00 cm/s  SHUNTS Systemic Diam: 2.00 cm Debbe Odea MD Electronically signed by Debbe Odea MD Signature Date/Time: 09/24/2023/11:57:16 AM    Final    CT Angio Chest Pulmonary Embolism (PE) W or WO Contrast  Result Date: 09/24/2023 CLINICAL DATA:  Shortness of breath EXAM: CT ANGIOGRAPHY CHEST WITH CONTRAST TECHNIQUE: Multidetector CT imaging of the chest was performed using the standard protocol during bolus administration of intravenous contrast. Multiplanar CT image reconstructions and MIPs were obtained to evaluate the vascular anatomy. RADIATION DOSE REDUCTION: This exam was performed according to the departmental dose-optimization program which includes automated exposure control, adjustment of the mA and/or kV according to patient size and/or use of iterative reconstruction technique. CONTRAST:  75mL OMNIPAQUE IOHEXOL 350 MG/ML SOLN COMPARISON:  06/07/2023 FINDINGS: Cardiovascular: No filling defects in the pulmonary arteries to suggest pulmonary emboli. Heart is normal size. Aorta is normal caliber. Scattered coronary artery and aortic calcifications. Mediastinum/Nodes: No mediastinal, hilar, or axillary adenopathy. Trachea and esophagus are unremarkable. Thyroid unremarkable. Lungs/Pleura: Suspicious spiculated appearing pulmonary nodule in the inferior  right upper lobe measures 1.6 cm currently compared to 1.3 cm previously. Peripheral ground-glass nodular and airspace opacities in the right upper lobe and right lower lobe as well as left lower lobe and lingula. No effusions. Upper Abdomen: No acute findings Musculoskeletal: Chest wall soft tissues are unremarkable. No acute bony abnormality. Review of the MIP images confirms the above findings. IMPRESSION: No evidence of pulmonary embolus. Patchy bilateral ground-glass and nodular opacities, right greater than left. Appearance is concerning for pneumonia. Enlarging spiculated nodule in the inferior right upper lobe now 1.6 cm compared to 1.3  cm previously. Recommend further evaluation with PET CT. Coronary artery disease. Aortic Atherosclerosis (ICD10-I70.0). Electronically Signed   By: Charlett Nose M.D.   On: 09/24/2023 01:45   DG Chest 2 View  Result Date: 09/23/2023 CLINICAL DATA:  Shortness of breath. EXAM: CHEST - 2 VIEW COMPARISON:  June 07, 2023. FINDINGS: Stable cardiomediastinal silhouette. Mild central pulmonary vascular congestion is noted with possible bilateral pulmonary edema. Possible mild right basilar atelectasis with small pleural effusion. Continued presence of right midlung nodule concerning for neoplasm. Bony thorax is unremarkable. IMPRESSION: Continued presence of right midlung nodule concerning for neoplasm. PET scan is recommended for further evaluation as originally recommended on CT scan of June 07, 2023. Mild central pulmonary vascular congestion is noted with possible bilateral pulmonary edema. Mild right basilar atelectasis is noted with small pleural effusion. Electronically Signed   By: Lupita Raider M.D.   On: 09/23/2023 17:35    Assessment and Plan   DALERY HEITKAMP is a 67 y.o. female with medical history significant for COPD on home O2 at 2 L, tobacco use disorder, HTN, DM, known pulmonary nodule 1.3 cm, discovered during a hospitalization in August 2024 for  COPD, who presents to the ED with shortness of breath that started while getting dressed at around 1400 on 11/21    Chest x-ray showed mild pulmonary vascular congestion with possible bilateral pulmonary edema a CT chest  Continued presence of right midlung nodule concerning for neoplasm. PET scan is recommended for further evaluation as originally recommended on CT scan of June 07, 2023.  Mild central pulmonary vascular congestion is noted with possible bilateral pulmonary edema. Mild right basilar atelectasis is noted with small pleural effusion.  Acute on chronic hypoxic respiratory failure secondary to COPD exacerbation acute diastolic congestive heart failure mild improved  -- patient received IV steroids-- change to PO steroid -- check Pro calcitonin. Hold off steroids for now. Patient has chronic cough -- wean oxygen as tolerated keep sets greater than 90% -- continue PRN nebs and inhalers -- will prescribe nebulizer for home -- received IV Lasix change now to oral Lasix 20 mg daily -- seen by PT recommends home health PT which has been arranged. -- If remains stable. Patient can discharge tomorrow  Generalized weakness -- PT to see patient-- home health PT -- patient does not do much at home given her chronic respiratory failure -- TOC for discharge planning  Known pulmonary nodule more than 1 cm no noted on CT chest -- patient follows with pulmonary Dr. Karna Christmas @[facility] -- I have discussed with her CT results and recommend her to follow-up closely  Heavy tobacco abuse -- discussed cessation  Type II diabetes with hyperglycemia in the setting of steroids morbid obesity -- sliding scale insulin -- resume home meds Tradjenta, glipizide  Hypertension -- resume lisinopril/hydrochlorothiazide   Procedures: Family communication : none at bedside Consults : CODE STATUS: full DVT Prophylaxis : enoxaparin Level of care: Progressive Status is:  Inpatient Remains inpatient appropriate because: COPD exacerbation    TOTAL TIME TAKING CARE OF THIS PATIENT: 35 minutes.  >50% time spent on counselling and coordination of care  Note: This dictation was prepared with Dragon dictation along with smaller phrase technology. Any transcriptional errors that result from this process are unintentional.  Enedina Finner M.D    [facility]   CC: Primary care physician; Inc, [employer]

## 2023-09-25 NOTE — TOC Progression Note (Signed)
Transition of Care [facility]) - Progression Note    Patient Details  Name: Michele House MRN: [id number] Date of Birth: [birth year]  [facility] of [facility]) CM/SW Contact  Bing Quarry, RN Phone Number: 09/25/2023, 2:24 PM  Clinical Narrative: 11/23: Plan is to discharge tomorrow. Declined HH due to having three large pit bull dogs on premises after speaking with patient.  Nebulizer machine ordered  via [employer]. Sister transports patient to appointments. Declined a BSC.  PCP: Dr Beverely Low at [facility] at [phone]   Gabriel Cirri MSN RN CM  Care Management Department.  [place]  [facility] Direct Dial: [phone] Main Office Phone: [phone] Weekends Only        Barriers to Discharge: Barriers Resolved  Expected Discharge Plan and Services                         DME Arranged: Nebulizer/meds DME Agency: [employer] Date DME Agency Contacted: 09/25/23 Time DME Agency Contacted: [phone] Representative spoke with at DME Agency: Selena Batten HH Arranged: Refused HH HH Agency: NA         Social Determinants of Health (SDOH) Interventions SDOH Screenings   Food Insecurity: Food Insecurity Present (09/24/2023)  Housing: Low Risk  (09/24/2023)  Transportation Needs: Unmet Transportation Needs (09/24/2023)  Utilities: Not At Risk (09/24/2023)  Tobacco Use: Medium Risk (09/25/2023)    Readmission Risk Interventions     No data to display

## 2023-09-25 NOTE — Progress Notes (Signed)
Physical Therapy Evaluation Patient Details Name: Michele House MRN: [id number] DOB: 03-30-1956 Today's Date: 09/25/2023  History of Present Illness  Pt is 67 y/o admitted 09/23/23 for COPD. At baseline, pt uses 3-4L of O2 [place]. PmHx includes: HTN, acute on chronic respiratory failue with hypoxia, and tobacco use disorder.   Clinical Impression  Pt received in bed on 3L of O2 and agreed to PT session. Pt reported having abdominal discomforts when coughing on a scale of 5/10. Pt performed bed mobility SUP, STS with the use of RW (2wheels) CGA where they reported feeling unstable with their standing balance, and amb in room ~110ft CGA. Pt started at 90%SpO2 on 3L seated in bed and maintained SpO2% while seated EOB. Increased to 4L while seated EOB however SpO2 remained 90%, 6% SpO2 used during session through O2 tank for amb. Vitals throughout session read 90-95% SpO2 on O2. Pt reported feeling light headed at the end of session where VC were necessary for instructed pursed lip breathing. Pt ended session in recliner on 3L of O2 at 91% SpO2. Throughout session pt demonstrated fatigue and SOB with mobility in addition to talking while laying in bed. Pt declined further mobility due to fatigue. Pt tolerated Tx well and will continue to benefit from skilled PT sessions to improve functional mobility and activity tolerance to maximize safety/IND following D/C.        If plan is discharge home, recommend the following: A little help with walking and/or transfers;Assist for transportation;Help with stairs or ramp for entrance   Can travel by private vehicle        Equipment Recommendations Rolling walker (2 wheels);BSC/3in1  Recommendations for Other Services       Functional Status Assessment Patient has had a recent decline in their functional status and demonstrates the ability to make significant improvements in function in a reasonable and predictable amount of time.     Precautions /  Restrictions Precautions Precautions: Fall Restrictions Weight Bearing Restrictions: No      Mobility  Bed Mobility Overal bed mobility: Needs Assistance Bed Mobility: Supine to Sit     Supine to sit: Supervision     General bed mobility comments: Pt performed bed mobility SUP. O2 was increased in preparation for increased mobility    Transfers Overall transfer level: Needs assistance   Transfers: Sit to/from Stand Sit to Stand: Contact guard assist           General transfer comment: Pt performed STS with the use of RW (2wheels) CGA. While standing pt reported feeling unstable. Pt did not report any s/sx relative to dizziness.    Ambulation/Gait Ambulation/Gait assistance: Contact guard assist Gait Distance (Feet): 10 Feet Assistive device: Rolling walker (2 wheels) Gait Pattern/deviations: Step-to pattern Gait velocity: decreased     General Gait Details: Pt amb in room with the use of RW (2wheels) CGA. While amb, pt demonstrated SOB and reported dizziness while seated in recliner.  Stairs            Wheelchair Mobility     Tilt Bed    Modified Rankin (Stroke Patients Only)       Balance Overall balance assessment: Needs assistance Sitting-balance support: Feet supported Sitting balance-Leahy Scale: Good     Standing balance support: Bilateral upper extremity supported, During functional activity, Reliant on assistive device for balance Standing balance-Leahy Scale: Fair  Pertinent Vitals/Pain Pain Assessment Pain Assessment: 0-10 Pain Score: 5  Pain Location: Abdomen Pain Descriptors / Indicators: Pressure Pain Intervention(s): Monitored during session    Home Living Family/patient expects to be discharged to:: Private residence Living Arrangements: Children (Son) Available Help at Discharge: Family;Available PRN/intermittently Type of Home: Mobile home Home Access: Stairs to enter Entrance  Stairs-Rails: Can reach both Entrance Stairs-Number of Steps: 4-5   Home Layout: One level Home Equipment: Grab bars - tub/shower      Prior Function Prior Level of Function : Needs assist             Mobility Comments: Pt reports not participating in long distance walks. Pt did not use an AD to amb within home, however would use balance and items around the home for stability when amb ADLs Comments: Pt reports IND prior to admission     Extremity/Trunk Assessment   Upper Extremity Assessment Upper Extremity Assessment: Overall WFL for tasks assessed    Lower Extremity Assessment Lower Extremity Assessment: Generalized weakness       Communication   Communication Communication: No apparent difficulties Cueing Techniques: Verbal cues  Cognition Arousal: Alert Behavior During Therapy: WFL for tasks assessed/performed Overall Cognitive Status: Within Functional Limits for tasks assessed                                 General Comments: AOx4. Pt pleasant and willing to participate in PT session        General Comments      Exercises     Assessment/Plan    PT Assessment Patient needs continued PT services  PT Problem List Decreased strength;Decreased activity tolerance;Decreased balance;Decreased mobility       PT Treatment Interventions DME instruction;Gait training;Functional mobility training    PT Goals (Current goals can be found in the Care Plan section)  Acute Rehab PT Goals Patient Stated Goal: To go home PT Goal Formulation: With patient Time For Goal Achievement: 10/09/23 Potential to Achieve Goals: Good    Frequency Min 1X/week     Co-evaluation               AM-PAC PT "6 Clicks" Mobility  Outcome Measure Help needed turning from your back to your side while in a flat bed without using bedrails?: A Little Help needed moving from lying on your back to sitting on the side of a flat bed without using bedrails?: A  Little Help needed moving to and from a bed to a chair (including a wheelchair)?: A Little Help needed standing up from a chair using your arms (e.g., wheelchair or bedside chair)?: A Little Help needed to walk in hospital room?: A Little Help needed climbing 3-5 steps with a railing? : A Little 6 Click Score: 18    End of Session Equipment Utilized During Treatment: Gait belt;Oxygen Activity Tolerance: Patient tolerated treatment well;Patient limited by fatigue Patient left: in chair;with call bell/phone within reach;with chair alarm set Nurse Communication: Mobility status PT Visit Diagnosis: Unsteadiness on feet (R26.81);Other abnormalities of gait and mobility (R26.89);Muscle weakness (generalized) (M62.81);Difficulty in walking, not elsewhere classified (R26.2)    Time: 0630-1601 PT Time Calculation (min) (ACUTE ONLY): 25 min   Charges:   PT Evaluation $PT Eval Low Complexity: 1 Low PT Treatments $Gait Training: 8-22 mins PT General Charges $$ ACUTE PT VISIT: 1 Visit         Farid Grigorian Sauvignon Howard SPT, LAT, ATC  Jaidan Prevette Sauvignon-Howard 09/25/2023, 1:02 PM

## 2023-09-25 NOTE — Plan of Care (Signed)
Problem: Education: Goal: Ability to describe self-care measures that may prevent or decrease complications (Diabetes Survival Skills Education) will improve Outcome: Progressing   Problem: Coping: Goal: Ability to adjust to condition or change in health will improve Outcome: Progressing   Problem: Fluid Volume: Goal: Ability to maintain a balanced intake and output will improve Outcome: Progressing   Problem: Health Behavior/Discharge Planning: Goal: Ability to identify and utilize available resources and services will improve Outcome: Progressing Goal: Ability to manage health-related needs will improve Outcome: Progressing   Problem: Nutritional: Goal: Maintenance of adequate nutrition will improve Outcome: Progressing

## 2023-09-26 DIAGNOSIS — J441 Chronic obstructive pulmonary disease with (acute) exacerbation: Secondary | ICD-10-CM | POA: Diagnosis not present

## 2023-09-26 LAB — GLUCOSE, CAPILLARY
Glucose-Capillary: 239 mg/dL — ABNORMAL HIGH (ref 70–99)
Glucose-Capillary: 405 mg/dL — ABNORMAL HIGH (ref 70–99)
Glucose-Capillary: 547 mg/dL (ref 70–99)
Glucose-Capillary: 65 mg/dL — ABNORMAL LOW (ref 70–99)

## 2023-09-26 MED ORDER — FUROSEMIDE 20 MG PO TABS: 20.0000 mg | ORAL_TABLET | Freq: Every day | ORAL | 0 refills | Status: DC

## 2023-09-26 MED ORDER — SENNOSIDES-DOCUSATE SODIUM 8.6-50 MG PO TABS
1.0000 | ORAL_TABLET | Freq: Two times a day (BID) | ORAL | Status: DC
  Administered 2023-09-26: 1 via ORAL
  Filled 2023-09-26: qty 1

## 2023-09-26 NOTE — Discharge Summary (Addendum)
Discharge Summary  Michele House EAV:[id number] DOB: 05/22/1956  PCP: [facility]  Admit date: 09/23/2023 Discharge date: 09/26/2023  Time spent: 35 minutes   Recommendations for Outpatient Follow-up:  Follow-up with your primary care provider within a week. Follow-up with your pulmonologist within a week.  Discharge Diagnoses:  Active Hospital Problems   Diagnosis Date Noted   COPD (chronic obstructive pulmonary disease) (HCC) 09/24/2023   Acute on chronic respiratory failure with hypoxia (HCC) 06/07/2023    Priority: 1.   Acute CHF (congestive heart failure) (HCC) 09/23/2023    Priority: 2.   COPD with acute exacerbation (HCC) 06/07/2023    Priority: 2.   Pneumonia 09/24/2023    Priority: 3.   Pulmonary nodule 1 cm or greater in diameter 06/07/2023    Priority: 3.   Essential hypertension 10/12/2013   Type 2 diabetes mellitus with obesity (HCC) 10/12/2013   Tobacco use disorder 10/12/2013    Resolved Hospital Problems  No resolved problems to display.    Discharge Condition: Stable  Diet recommendation: Resume previous diet.  Vitals:   09/26/23 1350 09/26/23 1543  BP:  132/66  Pulse:  98  Resp:  17  Temp:  97.6 F (36.4 C)  SpO2: 97% 95%    History of present illness:  Michele House is a 67 y.o. female with medical history significant for COPD on home O2 at 2 L [place], tobacco use disorder, HTN, DM2, known pulmonary nodule 1.3 cm, discovered during a hospitalization in August 2024 for COPD, who presents to the ED with shortness of breath that started while getting dressed at around 1400 on 11/21.  Workup revealed acute on chronic hypoxic respiratory failure secondary to COPD exacerbation, acute diastolic CHF.  The patient received IV steroids and was diuresed with improvement.  09/26/2023: The patient was seen and examined at bedside.  She has no new complaints.  Her home O2 evaluation was performed.  The patient appears to be at her  baseline oxygen requirement.  Hospital Course:  Principal Problem:   COPD (chronic obstructive pulmonary disease) (HCC) Active Problems:   Acute on chronic respiratory failure with hypoxia (HCC)   COPD with acute exacerbation (HCC)   Acute CHF (congestive heart failure) (HCC)   Pulmonary nodule 1 cm or greater in diameter   Pneumonia   Tobacco use disorder   Essential hypertension   Type 2 diabetes mellitus with obesity (HCC)  Acute on chronic hypoxic respiratory failure secondary to COPD exacerbation At baseline on 2 L nasal cannula continuously, back to baseline. -- patient received IV steroids-- changed to PO steroid and nebs Continue steroid taper and Z-Pak Follow-up with pulmonology outpatient.  Elevated BNP Received IV Lasix, switch to low-dose p.o. Lasix 20 mg daily BNP 158 on presentation, 2D echo done on 09/24/2023 showed LVEF 55 to 60%, no regional wall motion abnormalities.   Generalized weakness -- Seen by PT OT and case manager, the patient declined home health services due to having 3 large pitbull dogs on premises after speaking with the patient.   Known pulmonary nodule more than 1 cm no noted on CT chest -- patient follows with pulmonary Dr. Karna Christmas @[facility] -- I have discussed with her CT results and recommend her to follow-up closely. The patient was again made aware of the pulmonary nodule and was advised to follow-up with pulmonology and she agrees.   Heavy tobacco abuse -- discussed tobacco cessation   Type II diabetes with hyperglycemia in the setting of  steroids morbid obesity -- resume home meds Tradjenta, glipizide   Hypertension -- resume lisinopril/hydrochlorothiazide      Discharge Exam: BP 132/66 (BP Location: Left Arm)   Pulse 98   Temp 97.6 F (36.4 C) (Oral)   Resp 17   Ht 5\' 1"  (1.549 m)   Wt 74.1 kg   LMP 12/24/1998 Comment: prior to her hysterectomy  SpO2 95%   BMI 30.87 kg/m  General: 67 y.o. year-old female well  developed well nourished in no acute distress.  Alert and oriented x3. Cardiovascular: Regular rate and rhythm with no rubs or gallops.  No thyromegaly or JVD noted.   Respiratory: Clear to auscultation with no wheezes or rales. Good inspiratory effort. Abdomen: Soft nontender nondistended with normal bowel sounds x4 quadrants. Musculoskeletal: No lower extremity edema. 2/4 pulses in all 4 extremities. Skin: No ulcerative lesions noted or rashes, Psychiatry: Mood is appropriate for condition and setting  Discharge Instructions You were cared for by a hospitalist during your hospital stay. If you have any questions about your discharge medications or the care you received while you were in the hospital after you are discharged, you can call the unit and asked to speak with the hospitalist on call if the hospitalist that took care of you is not available. Once you are discharged, your primary care physician will handle any further medical issues. Please note that NO REFILLS for any discharge medications will be authorized once you are discharged, as it is imperative that you return to your primary care physician (or establish a relationship with a primary care physician if you do not have one) for your aftercare needs so that they can reassess your need for medications and monitor your lab values.   Allergies as of 09/26/2023       Reactions   Penicillins Hives   Aspirin Other (See Comments)   Lips numb   Sulfa Antibiotics         Medication List     STOP taking these medications    b complex-C-folic acid 1 MG capsule   cholecalciferol 25 MCG (1000 UNIT) tablet Commonly known as: VITAMIN D3   naproxen 500 MG tablet Commonly known as: NAPROSYN   nicotine 7 mg/24hr patch Commonly known as: NICODERM CQ - dosed in mg/24 hr   nitroGLYCERIN 0.4 MG SL tablet Commonly known as: NITROSTAT       TAKE these medications    aspirin EC 81 MG tablet Take 81 mg by mouth daily.    atorvastatin 20 MG tablet Commonly known as: LIPITOR Take 20 mg by mouth daily.   azithromycin 250 MG tablet Commonly known as: Zithromax Z-Pak Take 2 tablets (500 mg) on  Day 1,  followed by 1 tablet (250 mg) once daily on Days 2 through 5.   cetirizine 10 MG tablet Commonly known as: ZYRTEC Take 10 mg by mouth daily.   Combivent Respimat 20-100 MCG/ACT Aers respimat Generic drug: Ipratropium-Albuterol Inhale into the lungs every 6 (six) hours as needed for wheezing or shortness of breath.   dextromethorphan-guaiFENesin 30-600 MG 12hr tablet Commonly known as: MUCINEX DM Take 1 tablet by mouth 2 (two) times daily as needed for cough.   diclofenac sodium 1 % Gel Commonly known as: VOLTAREN Apply topically 4 (four) times daily.   Dulera 200-5 MCG/ACT Aero Generic drug: mometasone-formoterol Inhale 2 puffs into the lungs every 12 (twelve) hours.   furosemide 20 MG tablet Commonly known as: LASIX Take 1 tablet (20 mg total) by  mouth daily. Start taking on: September 27, 2023   glipiZIDE 10 MG tablet Commonly known as: GLUCOTROL Take 10 mg by mouth daily before breakfast.   ipratropium 17 MCG/ACT inhaler Commonly known as: ATROVENT HFA Inhale 2 puffs into the lungs every 6 (six) hours.   levothyroxine 100 MCG tablet Commonly known as: SYNTHROID Take 100 mcg by mouth daily before breakfast.   lisinopril-hydrochlorothiazide 20-25 MG tablet Commonly known as: ZESTORETIC Take 1 tablet by mouth daily.   multivitamin capsule Take 1 capsule by mouth daily.   predniSONE 10 MG (21) Tbpk tablet Commonly known as: STERAPRED UNI-PAK 21 TAB As directed on packaging   pregabalin 50 MG capsule Commonly known as: LYRICA Take 50 mg by mouth 2 (two) times daily.   sertraline 100 MG tablet Commonly known as: ZOLOFT Take 100 mg by mouth daily.   Tradjenta 5 MG Tabs tablet Generic drug: linagliptin Take 5 mg by mouth daily.               Durable Medical Equipment   (From admission, onward)           Start     Ordered   09/25/23 1254  For home use only DME Nebulizer machine  Once       Question Answer Comment  Patient needs a nebulizer to treat with the following condition COPD (chronic obstructive pulmonary disease) (HCC)   Length of Need Lifetime      09/25/23 1253   09/24/23 1331  For home use only DME Nebulizer machine  Once       Question Answer Comment  Patient needs a nebulizer to treat with the following condition COPD (chronic obstructive pulmonary disease) (HCC)   Length of Need Lifetime      09/24/23 1330           Allergies  Allergen Reactions   Penicillins Hives   Aspirin Other (See Comments)    Lips numb   Sulfa Antibiotics     Follow-up Information     [facility] Call today.   Why: Please call for a posthospital follow-up appointment. Contact information: [address] [place] [address] [phone]         Vida Rigger, MD. Call today.   Specialty: Pulmonary Disease Why: Please call for a posthospital follow-up appointment. Contact information: [address] [place] [address] [phone]                  The results of significant diagnostics from this hospitalization (including imaging, microbiology, ancillary and laboratory) are listed below for reference.    Significant Diagnostic Studies: ECHOCARDIOGRAM COMPLETE  Result Date: 09/24/2023    ECHOCARDIOGRAM REPORT   Patient Name:   AUSHA TOPEL [facility] Date of Exam: 09/24/2023 Medical Rec #:  [id number]         Height:       61.0 in Accession #:    [id number]        Weight:       170.0 lb Date of Birth:  April 25, 1956         BSA:          1.763 m Patient Age:    71 years          BP:           137/78 mmHg Patient Gender: F                 HR:  96 bpm. Exam Location:  [facility] Procedure: 2D Echo, Cardiac Doppler and Color Doppler Indications:     CHF--acute diastolic I50.31  History:         Patient has  no prior history of Echocardiogram examinations.                  COPD; Risk Factors:Diabetes and Hypertension.  Sonographer:     Cristela Blue Referring Phys:  [id number] Andris Baumann Diagnosing Phys: Debbe Odea MD  Sonographer Comments: Technically challenging study due to limited acoustic windows and no apical window. Image acquisition challenging due to COPD. IMPRESSIONS  1. Left ventricular ejection fraction, by estimation, is 55 to 60%. The left ventricle has normal function. The left ventricle has no regional wall motion abnormalities. Left ventricular diastolic parameters are indeterminate.  2. Right ventricular systolic function is normal. The right ventricular size is normal.  3. The mitral valve is normal in structure. No evidence of mitral valve regurgitation.  4. The aortic valve is grossly normal. Aortic valve regurgitation is not visualized.  5. The inferior vena cava is normal in size with greater than 50% respiratory variability, suggesting right atrial pressure of 3 mmHg. FINDINGS  Left Ventricle: Left ventricular ejection fraction, by estimation, is 55 to 60%. The left ventricle has normal function. The left ventricle has no regional wall motion abnormalities. The left ventricular internal cavity size was normal in size. There is  no left ventricular hypertrophy. Left ventricular diastolic parameters are indeterminate. Right Ventricle: The right ventricular size is normal. No increase in right ventricular wall thickness. Right ventricular systolic function is normal. Left Atrium: Left atrial size was normal in size. Right Atrium: Right atrial size was normal in size. Pericardium: There is no evidence of pericardial effusion. Mitral Valve: The mitral valve is normal in structure. No evidence of mitral valve regurgitation. Tricuspid Valve: The tricuspid valve is normal in structure. Tricuspid valve regurgitation is trivial. Aortic Valve: The aortic valve is grossly normal. Aortic valve  regurgitation is not visualized. Pulmonic Valve: The pulmonic valve was normal in structure. Pulmonic valve regurgitation is not visualized. Aorta: The aortic root is normal in size and structure. Venous: The inferior vena cava is normal in size with greater than 50% respiratory variability, suggesting right atrial pressure of 3 mmHg. IAS/Shunts: No atrial level shunt detected by color flow Doppler.  LEFT VENTRICLE PLAX 2D LVIDd:         3.60 cm LVIDs:         2.40 cm LV PW:         1.30 cm LV IVS:        0.90 cm LVOT diam:     2.00 cm LVOT Area:     3.14 cm  LEFT ATRIUM         Index LA diam:    3.20 cm 1.82 cm/m   AORTA Ao Root diam: 2.50 cm TRICUSPID VALVE TR Peak grad:   7.8 mmHg TR Vmax:        140.00 cm/s  SHUNTS Systemic Diam: 2.00 cm Debbe Odea MD Electronically signed by Debbe Odea MD Signature Date/Time: 09/24/2023/11:57:16 AM    Final    CT Angio Chest Pulmonary Embolism (PE) W or WO Contrast  Result Date: 09/24/2023 CLINICAL DATA:  Shortness of breath EXAM: CT ANGIOGRAPHY CHEST WITH CONTRAST TECHNIQUE: Multidetector CT imaging of the chest was performed using the standard protocol during bolus administration of intravenous contrast. Multiplanar CT image reconstructions and MIPs were obtained to evaluate  the vascular anatomy. RADIATION DOSE REDUCTION: This exam was performed according to the departmental dose-optimization program which includes automated exposure control, adjustment of the mA and/or kV according to patient size and/or use of iterative reconstruction technique. CONTRAST:  75mL OMNIPAQUE IOHEXOL 350 MG/ML SOLN COMPARISON:  06/07/2023 FINDINGS: Cardiovascular: No filling defects in the pulmonary arteries to suggest pulmonary emboli. Heart is normal size. Aorta is normal caliber. Scattered coronary artery and aortic calcifications. Mediastinum/Nodes: No mediastinal, hilar, or axillary adenopathy. Trachea and esophagus are unremarkable. Thyroid unremarkable. Lungs/Pleura:  Suspicious spiculated appearing pulmonary nodule in the inferior right upper lobe measures 1.6 cm currently compared to 1.3 cm previously. Peripheral ground-glass nodular and airspace opacities in the right upper lobe and right lower lobe as well as left lower lobe and lingula. No effusions. Upper Abdomen: No acute findings Musculoskeletal: Chest wall soft tissues are unremarkable. No acute bony abnormality. Review of the MIP images confirms the above findings. IMPRESSION: No evidence of pulmonary embolus. Patchy bilateral ground-glass and nodular opacities, right greater than left. Appearance is concerning for pneumonia. Enlarging spiculated nodule in the inferior right upper lobe now 1.6 cm compared to 1.3 cm previously. Recommend further evaluation with PET CT. Coronary artery disease. Aortic Atherosclerosis (ICD10-I70.0). Electronically Signed   By: Charlett Nose M.D.   On: 09/24/2023 01:45   DG Chest 2 View  Result Date: 09/23/2023 CLINICAL DATA:  Shortness of breath. EXAM: CHEST - 2 VIEW COMPARISON:  June 07, 2023. FINDINGS: Stable cardiomediastinal silhouette. Mild central pulmonary vascular congestion is noted with possible bilateral pulmonary edema. Possible mild right basilar atelectasis with small pleural effusion. Continued presence of right midlung nodule concerning for neoplasm. Bony thorax is unremarkable. IMPRESSION: Continued presence of right midlung nodule concerning for neoplasm. PET scan is recommended for further evaluation as originally recommended on CT scan of June 07, 2023. Mild central pulmonary vascular congestion is noted with possible bilateral pulmonary edema. Mild right basilar atelectasis is noted with small pleural effusion. Electronically Signed   By: Lupita Raider M.D.   On: 09/23/2023 17:35    Microbiology: Recent Results (from the past 240 hour(s))  SARS Coronavirus 2 by RT PCR (hospital order, performed in [facility] hospital lab) *cepheid single result test*  Anterior Nasal Swab     Status: None   Collection Time: 09/23/23  8:15 PM   Specimen: Anterior Nasal Swab  Result Value Ref Range Status   SARS Coronavirus 2 by RT PCR NEGATIVE NEGATIVE Final    Comment: (NOTE) SARS-CoV-2 target nucleic acids are NOT DETECTED.  The SARS-CoV-2 RNA is generally detectable in upper and lower respiratory specimens during the acute phase of infection. The lowest concentration of SARS-CoV-2 viral copies this assay can detect is 250 copies / mL. A negative result does not preclude SARS-CoV-2 infection and should not be used as the sole basis for treatment or other patient management decisions.  A negative result may occur with improper specimen collection / handling, submission of specimen other than nasopharyngeal swab, presence of viral mutation(s) within the areas targeted by this assay, and inadequate number of viral copies (<250 copies / mL). A negative result must be combined with clinical observations, patient history, and epidemiological information.  Fact Sheet for Patients:   RoadLapTop.co.za  Fact Sheet for Healthcare Providers: http://kim-miller.com/  This test is not yet approved or  cleared by the [place] FDA and has been authorized for detection and/or diagnosis of SARS-CoV-2 by FDA under an Emergency Use Authorization (EUA).  This EUA  will remain in effect (meaning this test can be used) for the duration of the COVID-19 declaration under Section 564(b)(1) of the Act, 21 U.S.C. section 360bbb-3(b)(1), unless the authorization is terminated or revoked sooner.  Performed at [facility], [address] [place], [place] [address]      Labs: Basic Metabolic Panel: Recent Labs  Lab 09/23/23 1543 09/24/23 0538 09/25/23 2211  NA 140 138  --   K 3.6 3.8  --   CL 96* 96*  --   CO2 32 30  --   GLUCOSE 170* 135* 451*  BUN 34* 36*  --   CREATININE 0.88 0.96  --   CALCIUM  10.2 10.0  --    Liver Function Tests: Recent Labs  Lab 09/23/23 1543  AST 21  ALT 20  ALKPHOS 139*  BILITOT 0.6  PROT 7.2  ALBUMIN 3.3*   No results for input(s): "LIPASE", "AMYLASE" in the last 168 hours. No results for input(s): "AMMONIA" in the last 168 hours. CBC: Recent Labs  Lab 09/23/23 1543 09/24/23 0538  WBC 14.4* 12.2*  NEUTROABS 12.2*  --   HGB 12.5 11.9*  HCT 38.3 35.8*  MCV 88.5 88.4  PLT 264 270   Cardiac Enzymes: No results for input(s): "CKTOTAL", "CKMB", "CKMBINDEX", "TROPONINI" in the last 168 hours. BNP: BNP (last 3 results) Recent Labs    09/23/23 1543  BNP 154.8*    ProBNP (last 3 results) No results for input(s): "PROBNP" in the last 8760 hours.  CBG: Recent Labs  Lab 09/25/23 1607 09/25/23 2122 09/26/23 0025 09/26/23 0759 09/26/23 1109  GLUCAP 242* 457* 405* 65* 239*       Signed:  Darlin Drop, MD [facility] 09/26/2023, 3:51 PM

## 2023-09-26 NOTE — Plan of Care (Signed)
  Problem: Coping: Goal: Ability to adjust to condition or change in health will improve Outcome: Progressing   Problem: Health Behavior/Discharge Planning: Goal: Ability to identify and utilize available resources and services will improve Outcome: Progressing Goal: Ability to manage health-related needs will improve Outcome: Progressing   Problem: Nutritional: Goal: Maintenance of adequate nutrition will improve Outcome: Progressing Goal: Progress toward achieving an optimal weight will improve Outcome: Progressing   Problem: Skin Integrity: Goal: Risk for impaired skin integrity will decrease Outcome: Progressing

## 2023-09-26 NOTE — Progress Notes (Signed)
93 SATURATION QUALIFICATIONS: (This note is used to comply with regulatory documentation for home oxygen)  Patient Saturations on Room Air at Rest = 93%  Patient Saturations on Room Air while Ambulating = 86%  Patient Saturations on 2 Liters of oxygen while Ambulating = 92%  Please briefly explain why patient needs home oxygen: Pt desaturated to 86% while on room air while ambulating

## 2023-09-27 ENCOUNTER — Other Ambulatory Visit: Payer: Self-pay | Admitting: Pulmonary Disease

## 2023-10-02 ENCOUNTER — Other Ambulatory Visit: Payer: Self-pay

## 2023-10-02 ENCOUNTER — Inpatient Hospital Stay
Admission: EM | Admit: 2023-10-02 | Discharge: 2023-12-02 | DRG: 166 | Disposition: A | Discharge status: Skilled Nursing Facility | Payer: 59 | Attending: Hospitalist | Admitting: Hospitalist

## 2023-10-02 ENCOUNTER — Emergency Department: Payer: 59

## 2023-10-02 DIAGNOSIS — N170 Acute kidney failure with tubular necrosis: Secondary | ICD-10-CM | POA: Diagnosis not present

## 2023-10-02 DIAGNOSIS — R578 Other shock: Secondary | ICD-10-CM | POA: Diagnosis not present

## 2023-10-02 DIAGNOSIS — Z5941 Food insecurity: Secondary | ICD-10-CM

## 2023-10-02 DIAGNOSIS — G0481 Other encephalitis and encephalomyelitis: Secondary | ICD-10-CM | POA: Diagnosis present

## 2023-10-02 DIAGNOSIS — R710 Precipitous drop in hematocrit: Secondary | ICD-10-CM | POA: Diagnosis not present

## 2023-10-02 DIAGNOSIS — Z66 Do not resuscitate: Secondary | ICD-10-CM | POA: Diagnosis present

## 2023-10-02 DIAGNOSIS — Z1152 Encounter for screening for COVID-19: Secondary | ICD-10-CM | POA: Diagnosis not present

## 2023-10-02 DIAGNOSIS — G8191 Hemiplegia, unspecified affecting right dominant side: Secondary | ICD-10-CM | POA: Diagnosis not present

## 2023-10-02 DIAGNOSIS — R569 Unspecified convulsions: Secondary | ICD-10-CM | POA: Diagnosis not present

## 2023-10-02 DIAGNOSIS — Z7984 Long term (current) use of oral hypoglycemic drugs: Secondary | ICD-10-CM

## 2023-10-02 DIAGNOSIS — F0392 Unspecified dementia, unspecified severity, with psychotic disturbance: Secondary | ICD-10-CM | POA: Diagnosis present

## 2023-10-02 DIAGNOSIS — E875 Hyperkalemia: Secondary | ICD-10-CM | POA: Diagnosis not present

## 2023-10-02 DIAGNOSIS — K922 Gastrointestinal hemorrhage, unspecified: Secondary | ICD-10-CM | POA: Diagnosis not present

## 2023-10-02 DIAGNOSIS — R0789 Other chest pain: Principal | ICD-10-CM

## 2023-10-02 DIAGNOSIS — B9789 Other viral agents as the cause of diseases classified elsewhere: Secondary | ICD-10-CM | POA: Diagnosis present

## 2023-10-02 DIAGNOSIS — Z515 Encounter for palliative care: Secondary | ICD-10-CM | POA: Diagnosis not present

## 2023-10-02 DIAGNOSIS — E785 Hyperlipidemia, unspecified: Secondary | ICD-10-CM | POA: Diagnosis present

## 2023-10-02 DIAGNOSIS — I82401 Acute embolism and thrombosis of unspecified deep veins of right lower extremity: Secondary | ICD-10-CM

## 2023-10-02 DIAGNOSIS — E11649 Type 2 diabetes mellitus with hypoglycemia without coma: Secondary | ICD-10-CM | POA: Diagnosis not present

## 2023-10-02 DIAGNOSIS — G9341 Metabolic encephalopathy: Secondary | ICD-10-CM | POA: Diagnosis present

## 2023-10-02 DIAGNOSIS — E039 Hypothyroidism, unspecified: Secondary | ICD-10-CM | POA: Diagnosis present

## 2023-10-02 DIAGNOSIS — Z803 Family history of malignant neoplasm of breast: Secondary | ICD-10-CM

## 2023-10-02 DIAGNOSIS — R911 Solitary pulmonary nodule: Secondary | ICD-10-CM | POA: Diagnosis not present

## 2023-10-02 DIAGNOSIS — Z794 Long term (current) use of insulin: Secondary | ICD-10-CM

## 2023-10-02 DIAGNOSIS — B951 Streptococcus, group B, as the cause of diseases classified elsewhere: Secondary | ICD-10-CM | POA: Diagnosis present

## 2023-10-02 DIAGNOSIS — E1140 Type 2 diabetes mellitus with diabetic neuropathy, unspecified: Secondary | ICD-10-CM | POA: Diagnosis present

## 2023-10-02 DIAGNOSIS — K269 Duodenal ulcer, unspecified as acute or chronic, without hemorrhage or perforation: Secondary | ICD-10-CM

## 2023-10-02 DIAGNOSIS — I639 Cerebral infarction, unspecified: Secondary | ICD-10-CM | POA: Diagnosis not present

## 2023-10-02 DIAGNOSIS — F0394 Unspecified dementia, unspecified severity, with anxiety: Secondary | ICD-10-CM | POA: Diagnosis present

## 2023-10-02 DIAGNOSIS — Z91148 Patient's other noncompliance with medication regimen for other reason: Secondary | ICD-10-CM

## 2023-10-02 DIAGNOSIS — I5033 Acute on chronic diastolic (congestive) heart failure: Secondary | ICD-10-CM | POA: Diagnosis not present

## 2023-10-02 DIAGNOSIS — E66811 Obesity, class 1: Secondary | ICD-10-CM | POA: Diagnosis present

## 2023-10-02 DIAGNOSIS — Z880 Allergy status to penicillin: Secondary | ICD-10-CM

## 2023-10-02 DIAGNOSIS — Z8719 Personal history of other diseases of the digestive system: Secondary | ICD-10-CM | POA: Diagnosis not present

## 2023-10-02 DIAGNOSIS — I82409 Acute embolism and thrombosis of unspecified deep veins of unspecified lower extremity: Secondary | ICD-10-CM | POA: Diagnosis not present

## 2023-10-02 DIAGNOSIS — J9622 Acute and chronic respiratory failure with hypercapnia: Secondary | ICD-10-CM | POA: Diagnosis present

## 2023-10-02 DIAGNOSIS — E874 Mixed disorder of acid-base balance: Secondary | ICD-10-CM | POA: Diagnosis not present

## 2023-10-02 DIAGNOSIS — J441 Chronic obstructive pulmonary disease with (acute) exacerbation: Secondary | ICD-10-CM | POA: Diagnosis present

## 2023-10-02 DIAGNOSIS — N3001 Acute cystitis with hematuria: Secondary | ICD-10-CM | POA: Diagnosis not present

## 2023-10-02 DIAGNOSIS — Z7902 Long term (current) use of antithrombotics/antiplatelets: Secondary | ICD-10-CM

## 2023-10-02 DIAGNOSIS — K264 Chronic or unspecified duodenal ulcer with hemorrhage: Secondary | ICD-10-CM | POA: Diagnosis present

## 2023-10-02 DIAGNOSIS — R4182 Altered mental status, unspecified: Secondary | ICD-10-CM | POA: Insufficient documentation

## 2023-10-02 DIAGNOSIS — E870 Hyperosmolality and hypernatremia: Secondary | ICD-10-CM

## 2023-10-02 DIAGNOSIS — E119 Type 2 diabetes mellitus without complications: Secondary | ICD-10-CM | POA: Diagnosis present

## 2023-10-02 DIAGNOSIS — G40909 Epilepsy, unspecified, not intractable, without status epilepticus: Secondary | ICD-10-CM | POA: Diagnosis present

## 2023-10-02 DIAGNOSIS — E1165 Type 2 diabetes mellitus with hyperglycemia: Secondary | ICD-10-CM | POA: Diagnosis present

## 2023-10-02 DIAGNOSIS — Z886 Allergy status to analgesic agent status: Secondary | ICD-10-CM

## 2023-10-02 DIAGNOSIS — E669 Obesity, unspecified: Secondary | ICD-10-CM | POA: Diagnosis not present

## 2023-10-02 DIAGNOSIS — D75839 Thrombocytosis, unspecified: Secondary | ICD-10-CM | POA: Diagnosis not present

## 2023-10-02 DIAGNOSIS — F03918 Unspecified dementia, unspecified severity, with other behavioral disturbance: Secondary | ICD-10-CM | POA: Diagnosis present

## 2023-10-02 DIAGNOSIS — G9340 Encephalopathy, unspecified: Secondary | ICD-10-CM | POA: Diagnosis not present

## 2023-10-02 DIAGNOSIS — I6389 Other cerebral infarction: Secondary | ICD-10-CM | POA: Diagnosis not present

## 2023-10-02 DIAGNOSIS — C3411 Malignant neoplasm of upper lobe, right bronchus or lung: Secondary | ICD-10-CM | POA: Diagnosis present

## 2023-10-02 DIAGNOSIS — I5031 Acute diastolic (congestive) heart failure: Secondary | ICD-10-CM | POA: Diagnosis not present

## 2023-10-02 DIAGNOSIS — I10 Essential (primary) hypertension: Secondary | ICD-10-CM | POA: Diagnosis not present

## 2023-10-02 DIAGNOSIS — K219 Gastro-esophageal reflux disease without esophagitis: Secondary | ICD-10-CM | POA: Diagnosis present

## 2023-10-02 DIAGNOSIS — M79604 Pain in right leg: Secondary | ICD-10-CM | POA: Diagnosis not present

## 2023-10-02 DIAGNOSIS — J189 Pneumonia, unspecified organism: Secondary | ICD-10-CM | POA: Diagnosis present

## 2023-10-02 DIAGNOSIS — I9589 Other hypotension: Secondary | ICD-10-CM | POA: Diagnosis not present

## 2023-10-02 DIAGNOSIS — T502X5A Adverse effect of carbonic-anhydrase inhibitors, benzothiadiazides and other diuretics, initial encounter: Secondary | ICD-10-CM | POA: Diagnosis present

## 2023-10-02 DIAGNOSIS — F0390 Unspecified dementia without behavioral disturbance: Secondary | ICD-10-CM | POA: Diagnosis not present

## 2023-10-02 DIAGNOSIS — J440 Chronic obstructive pulmonary disease with acute lower respiratory infection: Secondary | ICD-10-CM | POA: Diagnosis present

## 2023-10-02 DIAGNOSIS — Z87891 Personal history of nicotine dependence: Secondary | ICD-10-CM

## 2023-10-02 DIAGNOSIS — E871 Hypo-osmolality and hyponatremia: Secondary | ICD-10-CM | POA: Diagnosis not present

## 2023-10-02 DIAGNOSIS — R29721 NIHSS score 21: Secondary | ICD-10-CM | POA: Diagnosis present

## 2023-10-02 DIAGNOSIS — I110 Hypertensive heart disease with heart failure: Secondary | ICD-10-CM | POA: Diagnosis present

## 2023-10-02 DIAGNOSIS — Z79899 Other long term (current) drug therapy: Secondary | ICD-10-CM

## 2023-10-02 DIAGNOSIS — N390 Urinary tract infection, site not specified: Secondary | ICD-10-CM | POA: Diagnosis not present

## 2023-10-02 DIAGNOSIS — K921 Melena: Secondary | ICD-10-CM

## 2023-10-02 DIAGNOSIS — Z79890 Hormone replacement therapy (postmenopausal): Secondary | ICD-10-CM

## 2023-10-02 DIAGNOSIS — I5030 Unspecified diastolic (congestive) heart failure: Secondary | ICD-10-CM

## 2023-10-02 DIAGNOSIS — R000 Tachycardia, unspecified: Secondary | ICD-10-CM | POA: Diagnosis not present

## 2023-10-02 DIAGNOSIS — J9621 Acute and chronic respiratory failure with hypoxia: Secondary | ICD-10-CM | POA: Diagnosis present

## 2023-10-02 DIAGNOSIS — E1169 Type 2 diabetes mellitus with other specified complication: Secondary | ICD-10-CM | POA: Diagnosis present

## 2023-10-02 DIAGNOSIS — Z8249 Family history of ischemic heart disease and other diseases of the circulatory system: Secondary | ICD-10-CM

## 2023-10-02 DIAGNOSIS — Z882 Allergy status to sulfonamides status: Secondary | ICD-10-CM

## 2023-10-02 DIAGNOSIS — J45901 Unspecified asthma with (acute) exacerbation: Secondary | ICD-10-CM | POA: Diagnosis present

## 2023-10-02 DIAGNOSIS — Z9981 Dependence on supplemental oxygen: Secondary | ICD-10-CM

## 2023-10-02 DIAGNOSIS — I959 Hypotension, unspecified: Secondary | ICD-10-CM | POA: Diagnosis not present

## 2023-10-02 DIAGNOSIS — M79605 Pain in left leg: Secondary | ICD-10-CM

## 2023-10-02 DIAGNOSIS — Z6830 Body mass index (BMI) 30.0-30.9, adult: Secondary | ICD-10-CM

## 2023-10-02 DIAGNOSIS — Z7951 Long term (current) use of inhaled steroids: Secondary | ICD-10-CM

## 2023-10-02 DIAGNOSIS — I82451 Acute embolism and thrombosis of right peroneal vein: Secondary | ICD-10-CM | POA: Diagnosis present

## 2023-10-02 DIAGNOSIS — E16A2 Hypoglycemia level 2: Secondary | ICD-10-CM | POA: Diagnosis not present

## 2023-10-02 DIAGNOSIS — K5900 Constipation, unspecified: Secondary | ICD-10-CM | POA: Diagnosis not present

## 2023-10-02 DIAGNOSIS — Z5982 Transportation insecurity: Secondary | ICD-10-CM

## 2023-10-02 DIAGNOSIS — I5032 Chronic diastolic (congestive) heart failure: Secondary | ICD-10-CM | POA: Diagnosis not present

## 2023-10-02 DIAGNOSIS — Z7982 Long term (current) use of aspirin: Secondary | ICD-10-CM

## 2023-10-02 DIAGNOSIS — E878 Other disorders of electrolyte and fluid balance, not elsewhere classified: Secondary | ICD-10-CM | POA: Diagnosis not present

## 2023-10-02 DIAGNOSIS — D430 Neoplasm of uncertain behavior of brain, supratentorial: Secondary | ICD-10-CM | POA: Diagnosis present

## 2023-10-02 DIAGNOSIS — D62 Acute posthemorrhagic anemia: Secondary | ICD-10-CM | POA: Diagnosis present

## 2023-10-02 LAB — CBC WITH DIFFERENTIAL/PLATELET
Abs Immature Granulocytes: 0.68 10*3/uL — ABNORMAL HIGH (ref 0.00–0.07)
Basophils Absolute: 0.1 10*3/uL (ref 0.0–0.1)
Basophils Relative: 0 %
Eosinophils Absolute: 0 10*3/uL (ref 0.0–0.5)
Eosinophils Relative: 0 %
HCT: 46.3 % — ABNORMAL HIGH (ref 36.0–46.0)
Hemoglobin: 15.1 g/dL — ABNORMAL HIGH (ref 12.0–15.0)
Immature Granulocytes: 3 %
Lymphocytes Relative: 7 %
Lymphs Abs: 1.7 10*3/uL (ref 0.7–4.0)
MCH: 28.9 pg (ref 26.0–34.0)
MCHC: 32.6 g/dL (ref 30.0–36.0)
MCV: 88.7 fL (ref 80.0–100.0)
Monocytes Absolute: 1.3 10*3/uL — ABNORMAL HIGH (ref 0.1–1.0)
Monocytes Relative: 6 %
Neutro Abs: 19.7 10*3/uL — ABNORMAL HIGH (ref 1.7–7.7)
Neutrophils Relative %: 84 %
Platelets: 401 10*3/uL — ABNORMAL HIGH (ref 150–400)
RBC: 5.22 MIL/uL — ABNORMAL HIGH (ref 3.87–5.11)
RDW: 13.5 % (ref 11.5–15.5)
WBC: 23.4 10*3/uL — ABNORMAL HIGH (ref 4.0–10.5)
nRBC: 0 % (ref 0.0–0.2)

## 2023-10-02 LAB — COMPREHENSIVE METABOLIC PANEL
ALT: 25 U/L (ref 0–44)
AST: 24 U/L (ref 15–41)
Albumin: 4.1 g/dL (ref 3.5–5.0)
Alkaline Phosphatase: 111 U/L (ref 38–126)
Anion gap: 16 — ABNORMAL HIGH (ref 5–15)
BUN: 45 mg/dL — ABNORMAL HIGH (ref 8–23)
CO2: 31 mmol/L (ref 22–32)
Calcium: 9.9 mg/dL (ref 8.9–10.3)
Chloride: 85 mmol/L — ABNORMAL LOW (ref 98–111)
Creatinine, Ser: 1.03 mg/dL — ABNORMAL HIGH (ref 0.44–1.00)
GFR, Estimated: 59 mL/min — ABNORMAL LOW (ref >60)
Glucose, Bld: 368 mg/dL — ABNORMAL HIGH (ref 70–99)
Potassium: 3.8 mmol/L (ref 3.5–5.1)
Sodium: 132 mmol/L — ABNORMAL LOW (ref 135–145)
Total Bilirubin: 1.1 mg/dL (ref <1.2)
Total Protein: 7.3 g/dL (ref 6.5–8.1)

## 2023-10-02 LAB — CBG MONITORING, ED
Glucose-Capillary: 330 mg/dL — ABNORMAL HIGH (ref 70–99)
Glucose-Capillary: 84 mg/dL (ref 70–99)
Glucose-Capillary: 205 mg/dL — ABNORMAL HIGH (ref 70–99)

## 2023-10-02 LAB — URINALYSIS, ROUTINE W REFLEX MICROSCOPIC
Bilirubin Urine: NEGATIVE
Glucose, UA: 50 mg/dL — AB
Hgb urine dipstick: NEGATIVE
Ketones, ur: 5 mg/dL — AB
Nitrite: POSITIVE — AB
Protein, ur: 30 mg/dL — AB
Specific Gravity, Urine: 1.019 (ref 1.005–1.030)
pH: 6 (ref 5.0–8.0)

## 2023-10-02 LAB — BLOOD GAS, ARTERIAL
Acid-Base Excess: 13.7 mmol/L — ABNORMAL HIGH (ref 0.0–2.0)
Bicarbonate: 39.5 mmol/L — ABNORMAL HIGH (ref 20.0–28.0)
O2 Content: 2 L/min
O2 Saturation: 96.7 %
Patient temperature: 37
pCO2 arterial: 53 mm[Hg] — ABNORMAL HIGH (ref 32–48)
pH, Arterial: 7.48 — ABNORMAL HIGH (ref 7.35–7.45)
pO2, Arterial: 76 mm[Hg] — ABNORMAL LOW (ref 83–108)

## 2023-10-02 LAB — LACTIC ACID, PLASMA
Lactic Acid, Venous: 1.4 mmol/L (ref 0.5–1.9)
Lactic Acid, Venous: 1.6 mmol/L (ref 0.5–1.9)

## 2023-10-02 LAB — MAGNESIUM: Magnesium: 2.2 mg/dL (ref 1.7–2.4)

## 2023-10-02 LAB — URINE DRUG SCREEN, QUALITATIVE (ARMC ONLY)
Amphetamines, Ur Screen: NOT DETECTED
Barbiturates, Ur Screen: NOT DETECTED
Benzodiazepine, Ur Scrn: NOT DETECTED
Cannabinoid 50 Ng, Ur ~~LOC~~: NOT DETECTED
Cocaine Metabolite,Ur ~~LOC~~: NOT DETECTED
MDMA (Ecstasy)Ur Screen: NOT DETECTED
Methadone Scn, Ur: NOT DETECTED
Opiate, Ur Screen: NOT DETECTED
Phencyclidine (PCP) Ur S: NOT DETECTED
Tricyclic, Ur Screen: NOT DETECTED

## 2023-10-02 LAB — TROPONIN I (HIGH SENSITIVITY)
Troponin I (High Sensitivity): 17 ng/L (ref <18)
Troponin I (High Sensitivity): 22 ng/L — ABNORMAL HIGH (ref <18)
Troponin I (High Sensitivity): 18 ng/L — ABNORMAL HIGH (ref <18)

## 2023-10-02 LAB — AMMONIA: Ammonia: 39 umol/L — ABNORMAL HIGH (ref 9–35)

## 2023-10-02 LAB — PROCALCITONIN: Procalcitonin: <0.1 ng/mL

## 2023-10-02 LAB — BRAIN NATRIURETIC PEPTIDE: B Natriuretic Peptide: 21.5 pg/mL (ref 0.0–100.0)

## 2023-10-02 MED ORDER — INSULIN ASPART 100 UNIT/ML IJ SOLN
0.0000 [IU] | Freq: Three times a day (TID) | INTRAMUSCULAR | Status: DC
  Administered 2023-10-02: 11 [IU] via SUBCUTANEOUS
  Administered 2023-10-03: 15 [IU] via SUBCUTANEOUS
  Administered 2023-10-03: 5 [IU] via SUBCUTANEOUS
  Administered 2023-10-04: 11 [IU] via SUBCUTANEOUS
  Administered 2023-10-04: 3 [IU] via SUBCUTANEOUS
  Administered 2023-10-04: 2 [IU] via SUBCUTANEOUS
  Administered 2023-10-05: 3 [IU] via SUBCUTANEOUS
  Administered 2023-10-05: 8 [IU] via SUBCUTANEOUS
  Administered 2023-10-06: 5 [IU] via SUBCUTANEOUS
  Administered 2023-10-06 – 2023-10-07 (×3): 3 [IU] via SUBCUTANEOUS
  Administered 2023-10-07: 8 [IU] via SUBCUTANEOUS
  Filled 2023-10-02 (×11): qty 1

## 2023-10-02 MED ORDER — ACETAMINOPHEN 325 MG PO TABS
650.0000 mg | ORAL_TABLET | Freq: Four times a day (QID) | ORAL | Status: DC | PRN
  Administered 2023-10-07 – 2023-10-25 (×11): 650 mg via ORAL
  Filled 2023-10-02 (×11): qty 2

## 2023-10-02 MED ORDER — ONDANSETRON HCL 4 MG/2ML IJ SOLN
4.0000 mg | Freq: Four times a day (QID) | INTRAMUSCULAR | Status: DC | PRN
  Administered 2023-10-18: 4 mg via INTRAVENOUS
  Filled 2023-10-02: qty 2

## 2023-10-02 MED ORDER — LACTATED RINGERS IV BOLUS
1000.0000 mL | Freq: Once | INTRAVENOUS | Status: AC
  Administered 2023-10-02: 1000 mL via INTRAVENOUS

## 2023-10-02 MED ORDER — SODIUM CHLORIDE 0.9 % IV SOLN
500.0000 mg | INTRAVENOUS | Status: AC
  Administered 2023-10-02 – 2023-10-03 (×2): 500 mg via INTRAVENOUS
  Filled 2023-10-02 (×2): qty 5

## 2023-10-02 MED ORDER — DM-GUAIFENESIN ER 30-600 MG PO TB12
1.0000 | ORAL_TABLET | Freq: Two times a day (BID) | ORAL | Status: DC | PRN
  Administered 2023-10-18 – 2023-10-27 (×8): 1 via ORAL
  Filled 2023-10-02 (×10): qty 1

## 2023-10-02 MED ORDER — INSULIN ASPART 100 UNIT/ML IJ SOLN: 0.0000 [IU] | Freq: Every day | INTRAMUSCULAR | Status: DC

## 2023-10-02 MED ORDER — ENOXAPARIN SODIUM 40 MG/0.4ML IJ SOSY
40.0000 mg | PREFILLED_SYRINGE | INTRAMUSCULAR | Status: DC
  Administered 2023-10-02 – 2023-10-07 (×6): 40 mg via SUBCUTANEOUS
  Filled 2023-10-02 (×6): qty 0.4

## 2023-10-02 MED ORDER — SODIUM CHLORIDE 0.9 % IV SOLN
2.0000 g | INTRAVENOUS | Status: DC
  Administered 2023-10-03 – 2023-10-07 (×5): 2 g via INTRAVENOUS
  Filled 2023-10-02 (×5): qty 20

## 2023-10-02 MED ORDER — IPRATROPIUM-ALBUTEROL 0.5-2.5 (3) MG/3ML IN SOLN
3.0000 mL | RESPIRATORY_TRACT | Status: DC | PRN
  Administered 2023-10-17 – 2023-10-25 (×12): 3 mL via RESPIRATORY_TRACT
  Filled 2023-10-02 (×12): qty 3

## 2023-10-02 MED ORDER — IPRATROPIUM-ALBUTEROL 0.5-2.5 (3) MG/3ML IN SOLN
RESPIRATORY_TRACT | Status: AC
  Filled 2023-10-02: qty 3

## 2023-10-02 MED ORDER — FUROSEMIDE 40 MG PO TABS
20.0000 mg | ORAL_TABLET | Freq: Every day | ORAL | Status: DC
  Filled 2023-10-02: qty 1

## 2023-10-02 MED ORDER — PREDNISONE 20 MG PO TABS
40.0000 mg | ORAL_TABLET | Freq: Every day | ORAL | Status: AC
  Administered 2023-10-03 – 2023-10-06 (×4): 40 mg via ORAL
  Filled 2023-10-02 (×4): qty 2

## 2023-10-02 MED ORDER — ONDANSETRON HCL 4 MG PO TABS: 4.0000 mg | ORAL_TABLET | Freq: Four times a day (QID) | ORAL | Status: DC | PRN

## 2023-10-02 MED ORDER — INSULIN ASPART 100 UNIT/ML IJ SOLN
4.0000 [IU] | Freq: Three times a day (TID) | INTRAMUSCULAR | Status: DC
  Administered 2023-10-02 – 2023-10-06 (×10): 4 [IU] via SUBCUTANEOUS
  Filled 2023-10-02 (×11): qty 1

## 2023-10-02 MED ORDER — SODIUM CHLORIDE 0.9 % IV SOLN: 2.0000 g | INTRAVENOUS | Status: DC

## 2023-10-02 MED ORDER — LEVOTHYROXINE SODIUM 100 MCG PO TABS
100.0000 ug | ORAL_TABLET | Freq: Every day | ORAL | Status: DC
  Administered 2023-10-02 – 2023-11-14 (×37): 100 ug via ORAL
  Filled 2023-10-02 (×6): qty 1
  Filled 2023-10-02: qty 2
  Filled 2023-10-02 (×4): qty 1
  Filled 2023-10-02 (×4): qty 2
  Filled 2023-10-02: qty 1
  Filled 2023-10-02: qty 2
  Filled 2023-10-02 (×5): qty 1
  Filled 2023-10-02: qty 2
  Filled 2023-10-02 (×2): qty 1
  Filled 2023-10-02: qty 2
  Filled 2023-10-02: qty 1
  Filled 2023-10-02: qty 2
  Filled 2023-10-02 (×6): qty 1
  Filled 2023-10-02 (×2): qty 2
  Filled 2023-10-02 (×3): qty 1

## 2023-10-02 MED ORDER — ASPIRIN 81 MG PO TBEC
81.0000 mg | DELAYED_RELEASE_TABLET | Freq: Every day | ORAL | Status: DC
  Administered 2023-10-03 – 2023-10-07 (×5): 81 mg via ORAL
  Filled 2023-10-02 (×6): qty 1

## 2023-10-02 MED ORDER — ATORVASTATIN CALCIUM 20 MG PO TABS
20.0000 mg | ORAL_TABLET | Freq: Every day | ORAL | Status: DC
  Administered 2023-10-03 – 2023-10-06 (×4): 20 mg via ORAL
  Filled 2023-10-02 (×5): qty 1

## 2023-10-02 MED ORDER — LORATADINE 10 MG PO TABS
10.0000 mg | ORAL_TABLET | Freq: Every day | ORAL | Status: DC
  Administered 2023-10-03 – 2023-10-18 (×16): 10 mg via ORAL
  Filled 2023-10-02 (×17): qty 1

## 2023-10-02 MED ORDER — SODIUM CHLORIDE 0.9 % IV SOLN
2.0000 g | Freq: Once | INTRAVENOUS | Status: AC
  Administered 2023-10-02: 2 g via INTRAVENOUS
  Filled 2023-10-02: qty 20

## 2023-10-02 MED ORDER — IPRATROPIUM-ALBUTEROL 0.5-2.5 (3) MG/3ML IN SOLN
6.0000 mL | Freq: Once | RESPIRATORY_TRACT | Status: AC
  Administered 2023-10-02: 6 mL via RESPIRATORY_TRACT
  Filled 2023-10-02: qty 3

## 2023-10-02 MED ORDER — METHYLPREDNISOLONE SODIUM SUCC 40 MG IJ SOLR
40.0000 mg | Freq: Two times a day (BID) | INTRAMUSCULAR | Status: AC
  Administered 2023-10-02 – 2023-10-03 (×2): 40 mg via INTRAVENOUS
  Filled 2023-10-02 (×2): qty 1

## 2023-10-02 NOTE — ED Notes (Signed)
Pt states "I know I'm confused" RN explained that she has a Urinary tract infection and that can cause some confusion. Pt states "That's not it. I can't move my legs." RN performed muscular leg assessment. Pt able to keep leg elevated without drift for 5 seconds. Strong pedal pulses. Pt states "The doctor doesn't know. I manipulated him and I can manipulate you." Provider notified.

## 2023-10-02 NOTE — ED Provider Notes (Signed)
[facility] Provider Note    Event Date/Time   First MD Initiated Contact with Patient 10/02/23 [phone]     (approximate)   History   Chest Pain   HPI  Michele House is a 67 y.o. female who presents to the ED for evaluation of Chest Pain   I review medical DC summary from 1 week ago.  Admitted for CHF/COPD exacerbation.  States on 2 L at baseline.  Normal EF on echo  Patient presents to the ED from home via EMS for evaluation of poor responsiveness, chest discomfort.   Patient's son lives nearby and checks on her daily after her recent hospitalization.  He called 911 tonight because of patient's poor responsiveness being slow to answer questions.  Here in the ED, patient reports that she is here because of chest discomfort and shortness of breath.   Physical Exam   Triage Vital Signs: ED Triage Vitals  Encounter Vitals Group     BP      Systolic BP Percentile      Diastolic BP Percentile      Pulse      Resp      Temp      Temp src      SpO2      Weight      Height      Head Circumference      Peak Flow      Pain Score      Pain Loc      Pain Education      Exclude from Growth Chart     Most recent vital signs: Vitals:   10/02/23 0319 10/02/23 0328  BP:    Pulse:    Resp:    Temp:    SpO2: 100% 96%    General: Awake, no distress.  Very slow to respond and seems somewhat confused but she does answer her orientation questions correctly.  1000 yard stare, but does make eye contact with me when she is addressed.  Almost seems stoned CV:  Good peripheral perfusion.  Resp:  Mild tachypnea to the low 20s.  No distress.  Wheezing throughout with decreased airflow.  On her chronic 2 L Abd:  No distention.  Soft and benign MSK:  No deformity noted.  Neuro:  No focal deficits appreciated. Other:     ED Results / Procedures / Treatments   Labs (all labs ordered are listed, but only abnormal results are displayed) Labs Reviewed   BLOOD GAS, ARTERIAL - Abnormal; Notable for the following components:      Result Value   pH, Arterial 7.48 (*)    pCO2 arterial 53 (*)    pO2, Arterial 76 (*)    Bicarbonate 39.5 (*)    Acid-Base Excess 13.7 (*)    All other components within normal limits  CBC WITH DIFFERENTIAL/PLATELET - Abnormal; Notable for the following components:   WBC 23.4 (*)    RBC 5.22 (*)    Hemoglobin 15.1 (*)    HCT 46.3 (*)    Platelets 401 (*)    Neutro Abs 19.7 (*)    Monocytes Absolute 1.3 (*)    Abs Immature Granulocytes 0.68 (*)    All other components within normal limits  COMPREHENSIVE METABOLIC PANEL - Abnormal; Notable for the following components:   Sodium 132 (*)    Chloride 85 (*)    Glucose, Bld 368 (*)    BUN 45 (*)    Creatinine, Ser  1.03 (*)    GFR, Estimated 59 (*)    Anion gap 16 (*)    All other components within normal limits  URINALYSIS, ROUTINE W REFLEX MICROSCOPIC - Abnormal; Notable for the following components:   Color, Urine YELLOW (*)    APPearance TURBID (*)    Glucose, UA 50 (*)    Ketones, ur 5 (*)    Protein, ur 30 (*)    Nitrite POSITIVE (*)    Leukocytes,Ua SMALL (*)    Bacteria, UA FEW (*)    All other components within normal limits  CULTURE, BLOOD (ROUTINE X 2)  CULTURE, BLOOD (ROUTINE X 2)  BRAIN NATRIURETIC PEPTIDE  MAGNESIUM  URINE DRUG SCREEN, QUALITATIVE ([facility] ONLY)  LACTIC ACID, PLASMA  LACTIC ACID, PLASMA  PROCALCITONIN  TROPONIN I (HIGH SENSITIVITY)  TROPONIN I (HIGH SENSITIVITY)    EKG Sinus tachycardia with rate of 109 bpm.  Leftward axis, incomplete left bundle.  No STEMI.  Nonspecific ST changes  RADIOLOGY CT head interpreted by me without evidence of acute intracranial pathology 1 view CXR interpreted by me with right peripheral possible opacity that was there last week  Official radiology report(s): CT HEAD WO CONTRAST ( )  Result Date: 10/02/2023 CLINICAL DATA:  Nonfocal confusion.  Slow response to questioning. EXAM: CT  HEAD WITHOUT CONTRAST TECHNIQUE: Contiguous axial images were obtained from the base of the skull through the vertex without intravenous contrast. RADIATION DOSE REDUCTION: This exam was performed according to the departmental dose-optimization program which includes automated exposure control, adjustment of the mA and/or kV according to patient size and/or use of iterative reconstruction technique. COMPARISON:  07/10/2015 in 10/21/2004. FINDINGS: Brain: No evidence of acute infarction, hemorrhage, hydrocephalus, extra-axial collection or mass effect. Ovoid mass in the right lateral ventricle, just anterior and superior to the foramen Monro, 11 mm in size and in retrospect present since 2005. There is mild cerebral volume loss for age. Vascular: No hyperdense vessel or unexpected calcification. Skull: Normal. Negative for fracture or focal lesion. Sinuses/Orbits: No acute finding. IMPRESSION: 1. No acute finding. 2. 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 Electronically Signed   By: Tiburcio Pea M.D.   On: 10/02/2023 04:17   DG Chest Portable 1 View  Result Date: 10/02/2023 CLINICAL DATA:  COPD exacerbation and confusion. EXAM: PORTABLE CHEST 1 VIEW COMPARISON:  September 23, 2023 FINDINGS: The heart size and mediastinal contours are within normal limits. Mild, predominantly stable areas of atelectasis and/or infiltrate are seen along the periphery of the right lung. Mild atelectatic changes are also suspected within the left lung base. No pleural effusion or pneumothorax is identified. Multilevel degenerative changes seen throughout the thoracic spine IMPRESSION: Mild, predominantly stable areas of atelectasis and/or infiltrate along the periphery of the right lung. Electronically Signed   By: Aram Candela M.D.   On: 10/02/2023 04:00    PROCEDURES and INTERVENTIONS:  .1-3 Lead EKG Interpretation  Performed by: Delton Prairie,  MD Authorized by: Delton Prairie, MD     Interpretation: abnormal     ECG rate:  110   ECG rate assessment: tachycardic     Rhythm: sinus tachycardia     Ectopy: none     Conduction: normal   .Critical Care  Performed by: Delton Prairie, MD Authorized by: Delton Prairie, MD   Critical care provider statement:    Critical care time (minutes):  30   Critical care time was exclusive of:  Separately  billable procedures and treating other patients   Critical care was necessary to treat or prevent imminent or life-threatening deterioration of the following conditions:  Sepsis   Critical care was time spent personally by me on the following activities:  Development of treatment plan with patient or surrogate, discussions with consultants, evaluation of patient's response to treatment, examination of patient, ordering and review of laboratory studies, ordering and review of radiographic studies, ordering and performing treatments and interventions, pulse oximetry, re-evaluation of patient's condition and review of old charts   Medications  ipratropium-albuterol (DUONEB) 0.5-2.5 (3) MG/3ML nebulizer solution (has no administration in time range)  cefTRIAXone (ROCEPHIN) 2 g in sodium chloride 0.9 % 100 mL IVPB (has no administration in time range)  ipratropium-albuterol (DUONEB) 0.5-2.5 (3) MG/3ML nebulizer solution 6 mL (6 mLs Nebulization Given 10/02/23 0328)  lactated ringers bolus 1,000 mL (1,000 mLs Intravenous New Bag/Given 10/02/23 0503)     IMPRESSION / MDM / ASSESSMENT AND PLAN / ED COURSE  I reviewed the triage vital signs and the nursing notes.  Differential diagnosis includes, but is not limited to, ACS, PTX, PNA, muscle strain/spasm, PE, dissection, anxiety, pleural effusion, hypercarbia, ICH or stroke, drug intoxication or polysubstance abuse  {Patient presents with symptoms of an acute illness or injury that is potentially life-threatening.  Patient presents from home with chest pain  and confusion.  Has signs of a COPD exacerbation and possible metabolic encephalopathy from stress bacteria and possibly sepsis.  No hypoxia but tachycardia is noted.  Leukocytosis is noted.  Uncertain if this is of an infectious etiology or related to recent steroid course for COPD exacerbation.  She is wheezing and has poor airflow, improved respiratory symptoms with breathing treatments.  Hyperglycemia but doubt DKA considering her gap is only 16.  She received IV fluids for this.  Her urine does have positive nitrites and small leukocytes and she has suprapubic tenderness so we will send this for culture and start her on ceftriaxone.  ABG with a normal CO2 and her CT head is clear.  Due to her ongoing apparent confusion we will consult medicine for admission.  Clinical Course as of 10/02/23 1610  Sat Oct 02, 2023  0428 WBC noted. Updated nurse of need for septic workup [DS]  0457 reassessed [DS]    Clinical Course User Index [DS] Delton Prairie, MD     FINAL CLINICAL IMPRESSION(S) / ED DIAGNOSES   Final diagnoses:  Other chest pain  COPD exacerbation (HCC)     Rx / DC Orders   ED Discharge Orders     None        Note:  This document was prepared using Dragon voice recognition software and may include unintentional dictation errors.   Delton Prairie, MD 10/02/23 [phone]

## 2023-10-02 NOTE — Assessment & Plan Note (Signed)
Decompensated respiratory failure, requiring 3 L nasal cannula in setting of baseline 2 L nasal cannula use on admission. Chest x-ray with right lung pneumonia Overlapping COPD exacerbation Continue supplemental oxygen-wean back to baseline as tolerated Monitor

## 2023-10-02 NOTE — ED Notes (Signed)
Michele House is pt son: Pt phone number is [phone].    Will be the one to pick up pt if she is discharged.

## 2023-10-02 NOTE — ED Triage Notes (Signed)
Pt arrived from home via [facility] c/o chest pain 9/10 in upper mid chest described as pressure, tightness, and stabbing. Pt son called EMS d/t the pt having delayed responses to questions.
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
## 2023-10-02 NOTE — Assessment & Plan Note (Signed)
Positive cough, wheezing, increased sputum production as well as shortness of breath in the setting of baseline COPD with chronic respiratory failure on 2 L Now on 3 L Chest x-ray with overlapping pneumonia IV Solu-Medrol DuoNebs IV Rocephin azithromycin for infectious coverage Continue supplemental oxygen Monitor
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
## 2023-10-02 NOTE — ED Notes (Signed)
Assisted pt with the bedpan. Changed pt brief and chux. Pt pulled up in bed. Pt states that she feels hot. Pt sweating on forehead. Oral temp 98.56f.

## 2023-10-02 NOTE — Assessment & Plan Note (Signed)
Noted mild encephalopathy with dysuria and urinalysis indicative of infection IV Rocephin in setting of overlapping pneumonia coverage Urine culture Monitor

## 2023-10-02 NOTE — ED Notes (Signed)
Patient's O2 increased to 6L [place] due to desatting to 87% while sleeping.

## 2023-10-02 NOTE — ED Notes (Signed)
CCMD called and pt placed on central cardiac monitoring

## 2023-10-02 NOTE — ED Notes (Signed)
Pt at 50% of meal tray

## 2023-10-02 NOTE — Assessment & Plan Note (Signed)
Mild slowed mentation on presentation with otherwise nonfocal neuroexam Oriented x 4 though with delayed responsiveness CT head within normal limits Suspect multifactorial in Setting of decompensated respiratory failure with hypoxia, pneumonia, COPD exacerbation, UTI No overt hypercarbia on blood gas Blood sugar in 360s which is also a confounding issue Mildly elevated ammonia. Unable to take care of herself, concern of advancing dementia. PT/OT are recommending rehab.  Patient requires 24/7 care

## 2023-10-02 NOTE — ED Notes (Signed)
Pt breakfast tray at bedside ?

## 2023-10-02 NOTE — Assessment & Plan Note (Signed)
Right lung pneumonia with noted overlapping COPD exacerbation and decompensated respiratory failure IV Rocephin azithromycin for infectious coverage Blood and sputum cultures Urine strep and Legionella Monitor

## 2023-10-02 NOTE — Assessment & Plan Note (Signed)
2D echo 09/2023 showed LVEF 55 to 60%, no regional wall motion abnormalities  Appears fairly euvolemic at present Monitor volume status with treatment Follow

## 2023-10-02 NOTE — Assessment & Plan Note (Signed)
Blood sugar in 360s on presentation SSI Monitor blood sugars with steroid use Follow

## 2023-10-02 NOTE — ED Notes (Signed)
Pt's sister came to visit. Pt did not want her in her room. RN talked with sister in the hallway and she explained that she is unsafe to go back to her living situation. RN informed her that social work has been consulted and will be making their recommendations. Sister Wynona Canes would like the social worker to call when they are done with the evaluation. Christine's number is [phone]

## 2023-10-02 NOTE — Assessment & Plan Note (Signed)
BP stable Titrate home regimen 

## 2023-10-03 DIAGNOSIS — J9621 Acute and chronic respiratory failure with hypoxia: Secondary | ICD-10-CM | POA: Diagnosis not present

## 2023-10-03 DIAGNOSIS — N390 Urinary tract infection, site not specified: Secondary | ICD-10-CM | POA: Diagnosis not present

## 2023-10-03 DIAGNOSIS — G9340 Encephalopathy, unspecified: Secondary | ICD-10-CM | POA: Diagnosis not present

## 2023-10-03 DIAGNOSIS — J441 Chronic obstructive pulmonary disease with (acute) exacerbation: Secondary | ICD-10-CM | POA: Diagnosis not present

## 2023-10-03 LAB — RESPIRATORY PANEL BY PCR

## 2023-10-03 LAB — COMPREHENSIVE METABOLIC PANEL
ALT: 23 U/L (ref 0–44)
AST: 21 U/L (ref 15–41)
Albumin: 3.7 g/dL (ref 3.5–5.0)
Alkaline Phosphatase: 96 U/L (ref 38–126)
Anion gap: 11 (ref 5–15)
BUN: 36 mg/dL — ABNORMAL HIGH (ref 8–23)
CO2: 31 mmol/L (ref 22–32)
Calcium: 9.1 mg/dL (ref 8.9–10.3)
Chloride: 96 mmol/L — ABNORMAL LOW (ref 98–111)
Creatinine, Ser: 1.12 mg/dL — ABNORMAL HIGH (ref 0.44–1.00)
GFR, Estimated: 53 mL/min — ABNORMAL LOW (ref >60)
Glucose, Bld: 269 mg/dL — ABNORMAL HIGH (ref 70–99)
Potassium: 4.1 mmol/L (ref 3.5–5.1)
Sodium: 138 mmol/L (ref 135–145)
Total Bilirubin: 0.8 mg/dL (ref <1.2)
Total Protein: 6.3 g/dL — ABNORMAL LOW (ref 6.5–8.1)

## 2023-10-03 LAB — CBC
HCT: 42.8 % (ref 36.0–46.0)
Hemoglobin: 13.9 g/dL (ref 12.0–15.0)
MCH: 28.8 pg (ref 26.0–34.0)
MCHC: 32.5 g/dL (ref 30.0–36.0)
MCV: 88.8 fL (ref 80.0–100.0)
Platelets: 302 10*3/uL (ref 150–400)
RBC: 4.82 MIL/uL (ref 3.87–5.11)
RDW: 13.8 % (ref 11.5–15.5)
WBC: 14.8 10*3/uL — ABNORMAL HIGH (ref 4.0–10.5)
nRBC: 0 % (ref 0.0–0.2)

## 2023-10-03 LAB — CBG MONITORING, ED
Glucose-Capillary: 241 mg/dL — ABNORMAL HIGH (ref 70–99)
Glucose-Capillary: 359 mg/dL — ABNORMAL HIGH (ref 70–99)
Glucose-Capillary: 49 mg/dL — ABNORMAL LOW (ref 70–99)
Glucose-Capillary: 58 mg/dL — ABNORMAL LOW (ref 70–99)
Glucose-Capillary: 86 mg/dL (ref 70–99)

## 2023-10-03 LAB — STREP PNEUMONIAE URINARY ANTIGEN: Strep Pneumo Urinary Antigen: NEGATIVE

## 2023-10-03 LAB — GLUCOSE, CAPILLARY: Glucose-Capillary: 197 mg/dL — ABNORMAL HIGH (ref 70–99)

## 2023-10-03 MED ORDER — AZITHROMYCIN 500 MG PO TABS
500.0000 mg | ORAL_TABLET | Freq: Every day | ORAL | Status: AC
  Administered 2023-10-04 – 2023-10-06 (×3): 500 mg via ORAL
  Filled 2023-10-03 (×3): qty 1

## 2023-10-03 NOTE — ED Notes (Signed)
OT in with pt now, helped pt to [facility]. New chux pad placed on bed

## 2023-10-03 NOTE — ED Notes (Signed)
Pt provided of soda for blood glucose of 49. Pt encouraged to eat her meal tray

## 2023-10-03 NOTE — Progress Notes (Signed)
Progress Note   Patient: Michele House OZH:[id number] DOB: 05/08/1956 DOA: 10/02/2023     1 DOS: the patient was seen and examined on 10/03/2023   Brief hospital course: Taken from H&P.  Michele House is a 67 y.o. female with medical history significant of COPD, chronic respiratory failure on 2 L, hypertension, type 2 diabetes, HFpEF presenting with acute on chronic respiratory failure with hypoxia, COPD exacerbation, pneumonia, UTI, encephalopathy.  Patient reports generalized malaise over the past 3 to 4 days.  Patient noted to have been recently admitted in the hospital November 21 through November 24 for similar issues including COPD exacerbation.  Patient reports not taking her medication since discharge.  Presented to the ER afebrile, heart rate 100s, BP stable.  Satting 96% on 3 L nasal cannula.  White count 23.4, hemoglobin 15, platelets 401, lactate 1.6, procalcitonin less than 0.1, troponin 18, urinalysis indicative of infection.  Urine drug screen negative.  Chest x-ray with a right sided pneumonia.  BNP within normal limits.  Creatinine 1.03.  Glucose 368.EKG sinus tachycardia.  Admitted for concern of pneumonia/COPD exacerbation and UTI. Started on Rocephin and azithromycin.  12/1: Vital stable, respiratory viral panel negative, procalcitonin negative, preliminary blood cultures negative.  Urine cultures are ordered as add-on, ammonia levels mildly elevated at 39, strep pneumo negative, CBG elevated at 244, history of enlarging pulmonary nodule with recommendations for PET/CT during most recent admission which has not been done yet, if video bronchoscopy was scheduled for 10/13/2023 as outpatient.  Unable to take care of himself and son cannot provide the required care as she required 24-hour supervision due to worsening dementia.  PT is recommending SNF.        Assessment and Plan: * Encephalopathy Mild slowed mentation on presentation with otherwise nonfocal  neuroexam Oriented x 4 though with delayed responsiveness CT head within normal limits Suspect multifactorial in Setting of decompensated respiratory failure with hypoxia, pneumonia, COPD exacerbation, UTI No overt hypercarbia on blood gas Blood sugar in 360s which is also a confounding issue Mildly elevated ammonia. Unable to take care of herself, concern of advancing dementia. PT/OT are recommending rehab.  Patient requires 24/7 care   Acute on chronic respiratory failure with hypoxia (HCC) Decompensated respiratory failure, requiring 3 L nasal cannula in setting of baseline 2 L nasal cannula use on admission. Chest x-ray with right lung pneumonia Overlapping COPD exacerbation Continue supplemental oxygen-wean back to baseline as tolerated Monitor  COPD with acute exacerbation (HCC) Positive cough, wheezing, increased sputum production as well as shortness of breath in the setting of baseline COPD with chronic respiratory failure on 2 L Now on 3 L Chest x-ray with overlapping pneumonia IV Solu-Medrol DuoNebs Continue with Zithromax Continue supplemental oxygen Monitor  Pneumonia Concern of pneumonia on admission but procalcitonin negative. History of lung nodule patient need outpatient PET scan on most recent hospitalization A video bronchoscopy scheduled outpatient for 12/11 -Continue with Zithromax for concern of COPD exacerbation -Continuing ceftriaxone for concern of UTI  UTI (urinary tract infection) Noted mild encephalopathy with dysuria and urinalysis indicative of infection IV Rocephin  Follow-up urine culture Monitor  (HFpEF) heart failure with preserved ejection fraction (HCC) 2D echo 09/2023 showed LVEF 55 to 60%, no regional wall motion abnormalities  Appears fairly euvolemic at present Monitor volume status with treatment Follow  Essential hypertension BP stable Titrate home regimen  Type 2 diabetes mellitus with obesity (HCC) Blood sugar in 360s on  presentation SSI Monitor blood sugars with steroid use  Follow   Subjective: Patient was seen and examined today.  She was alert and oriented with a slow response.  Stating that she is having a lot of memory issues.  Improving dysuria.  Physical Exam: Vitals:   10/03/23 0649 10/03/23 0900 10/03/23 0945 10/03/23 1324  BP: (!) 102/53 122/61 123/69 (!) 153/103  Pulse: 82 (!) 114 (!) 103 (!) 103  Resp: 16 (!) 24 (!) 21 (!) 23  Temp:  98.3 F (36.8 C)  98 F (36.7 C)  TempSrc:  Oral  Oral  SpO2: 100% 96% 97% 94%   General.  Frail elderly lady, appears older than stated age, in no acute distress. Pulmonary.  Lungs clear bilaterally, normal respiratory effort. CV.  Regular rate and rhythm, no JVD, rub or murmur. Abdomen.  Soft, nontender, nondistended, BS positive. CNS.  Alert and oriented .  No focal neurologic deficit. Extremities.  No edema, no cyanosis, pulses intact and symmetrical.   Data Reviewed: Prior data reviewed  Family Communication:   Disposition: Status is: Inpatient Remains inpatient appropriate because: Severity of illness  Planned Discharge Destination: Rehab  DVT prophylaxis. Lovenox  Time spent: 45 minutes  This record has been created using Conservation officer, historic buildings. Errors have been sought and corrected,but may not always be located. Such creation errors do not reflect on the standard of care.   Author: Arnetha Courser, MD 10/03/2023 1:39 PM  For on call review [email or web address].

## 2023-10-03 NOTE — TOC Initial Note (Addendum)
Transition of Care [facility]) - Initial/Assessment Note    Patient Details  Name: Michele House MRN: [id number] Date of Birth: Jun 21, 1956  [facility]) CM/SW Contact:    Colette Ribas, LCSWA Phone Number: 10/03/2023, 10:46 AM  Clinical Narrative:                  CSW received message from MD around durable medical equipment. Of [facility], CSW called adapt spoke with Selena Batten, and ordered equipment with DC of today.  MD advised CSW needs to check with son if he is comfortable with her DC with son since her mobility is limited. CSW spoke with son he is not agreeable to [facility] with DC and would like her to DC to SNF. Since patient is not orientated I asked the MD how we can go out SNF placement.    4:00PM: CSW spoke with patient she is agreeable to SNF, but she wants to be near her daugther. I spoke with the son who wants to discuss this with his sister and confirm what city.     Patient Goals and CMS Choice            Expected Discharge Plan and Services                                              Prior Living Arrangements/Services                       Activities of Daily Living      Permission Sought/Granted                  Emotional Assessment              Admission diagnosis:  Altered mental status [R41.82] Patient Active Problem List   Diagnosis Date Noted   Altered mental status 10/02/2023   UTI (urinary tract infection) 10/02/2023   Encephalopathy 10/02/2023   (HFpEF) heart failure with preserved ejection fraction (HCC) 10/02/2023   Pneumonia 09/24/2023   COPD (chronic obstructive pulmonary disease) (HCC) 09/24/2023   Acute CHF (congestive heart failure) (HCC) 09/23/2023   COPD with acute exacerbation (HCC) 06/07/2023   Acute on chronic respiratory failure with hypoxia (HCC) 06/07/2023   Pulmonary nodule 1 cm or greater in diameter 06/07/2023   Allergic reaction 06/07/2023   Chest discomfort 10/12/2013   Tobacco  use disorder 10/12/2013   Essential hypertension 10/12/2013   Obesity 10/12/2013   Type 2 diabetes mellitus with obesity (HCC) 10/12/2013   PCP:  [facility]:   [facility] - [place], [place] - [address] [place] [address] Phone: [phone] Fax: [phone]  [facility] [address] [place] - [address] [place] [address] Phone: [phone] Fax: [phone]     Social Determinants of Health (SDOH) Social History: SDOH Screenings   Food Insecurity: Food Insecurity Present (09/24/2023)  Housing: Low Risk  (09/24/2023)  Transportation Needs: Unmet Transportation Needs (09/24/2023)  Utilities: Not At Risk (09/24/2023)  Tobacco Use: Medium Risk (10/02/2023)   SDOH Interventions:     Readmission Risk Interventions     No data to display

## 2023-10-03 NOTE — Hospital Course (Addendum)
Taken from H&P.  Michele House is a 67 y.o. female with medical history significant of COPD, chronic respiratory failure on 2 L, hypertension, type 2 diabetes, HFpEF presenting with acute on chronic respiratory failure with hypoxia, COPD exacerbation, pneumonia, UTI, encephalopathy.  Patient reports generalized malaise over the past 3 to 4 days.  Patient noted to have been recently admitted in the hospital November 21 through November 24 for similar issues including COPD exacerbation.  Patient reports not taking her medication since discharge.  Presented to the ER afebrile, heart rate 100s, BP stable.  Satting 96% on 3 L nasal cannula.  White count 23.4, hemoglobin 15, platelets 401, lactate 1.6, procalcitonin less than 0.1, troponin 18, urinalysis indicative of infection.  Urine drug screen negative.  Chest x-ray with a right sided pneumonia.  BNP within normal limits.  Creatinine 1.03.  Glucose 368.EKG sinus tachycardia.  Admitted for concern of pneumonia/COPD exacerbation and UTI. Started on Rocephin and azithromycin.  12/1: Vital stable, respiratory viral panel negative, procalcitonin negative, preliminary blood cultures negative.  Urine cultures are ordered as add-on, ammonia levels mildly elevated at 39, strep pneumo negative, CBG elevated at 244, history of enlarging pulmonary nodule with recommendations for PET/CT during most recent admission which has not been done yet, if video bronchoscopy was scheduled for 10/13/2023 as outpatient.  Unable to take care of himself and son cannot provide the required care as she required 24-hour supervision due to worsening dementia.  PT is recommending SNF.  12/2: Hemodynamically stable, now on baseline oxygen of 2 L.  Needs SNF placement. Urine cultures pending.  12/3: Blood pressure started trending up, restarting home Zestoretic.  Urine cultures with strep agalactiae, penicillin allergy noted, will continue with ceftriaxone for now.  Also started on  Remeron for concern of worsening depression and unable to sleep at night  12/4.  Some very concerned about patient's mental status not being seen.  Patient receiving antibiotics already and should have improved by now.  Will hold Lipitor.  Restart low-dose Zoloft.  Discontinue Remeron and do Seroquel at night. 12/5.  Patient has some blurry vision and weakness and pain.  MRI of the brain negative for acute intracranial abnormality. 12/6.  Patient stated she slept well.  Patient able to lift up her arms up off the bed today.  Patient thought she heard her son's voice outside the room. 12/7.  Patient sitting up in chair when I saw her.  Was able to feed herself breakfast.  Felt okay.  Did not offer any complaints. 12/8.  Patient more confused today than yesterday.  Able to follow some simple commands but not as good of a conversation today as yesterday. 12/9.  Patient still confused today.  Does not follow simple commands today. 12/10.  Patient more talkative today but was looking up at the ceiling when she was talking with me.  She does not move her extremities to command but does move them on her own.  Physical therapy and Occupational Therapy did not see her move her right arm. 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  Mild associated petechial hemorrhage.  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 12/12: Patient with new stroke, CTA head and neck with no large vessel occlusion.  Patient had a recent echocardiogram which was normal. CSF cultures remain negative.  Lipid panel  with increased triglyceride at 347, HDL 29 and LDL of 38.  CBC with mild thrombocytopenia at 130, BMP mostly unremarkable.  Patient with significant right-sided weakness. Concern of paraneoplastic versus hypercoagulability secondary to  malignancy.  Patient missed her appointment for bronchoscopy and outpatient appointment for PET scan due to recurrent hospitalizations. Involving pulmonary and oncology for their input. Send out encephalitis panel still pending.  12/13: Patient continued to have waxing and waning mental status, unable to move right upper and lower extremities.  Having some hallucination.  Pulmonary is planning lung biopsy on 12/27 if she remains stable.  Rapidly progressive dementia and now with underlying stroke, question of paraneoplastic encephalitis and hypercoagulability secondary to underlying undiagnosed malignancy. Patient will also get benefit from neuropsych evaluation as outpatient.  12/14: Patient with some improved mentation today.  Trying to squeeze with right hand but still no movements of right lower extremity.  12/15: Patient with much improved mentation.  Having some flickering of movements on right upper and lower extremity today, left extremities weaker but seems improving.   12/16: Mental status seems stable, flaccid right upper and lower extremities.  Complaining of muscle spasms so Robaxin was ordered.  Pulmonary is trying to do bronchoscopy and biopsy on Wednesday.

## 2023-10-03 NOTE — Evaluation (Addendum)
Occupational Therapy Evaluation Patient Details Name: Michele House MRN: [id number] DOB: 1956-09-09 Today's Date: 10/03/2023   History of Present Illness Pt is a 67 y/o F admitted on 10/02/23 after presenting with acute on chronic respiratory failure with hypoxia, COPD exacerbation, PNA, UTI, & encephalopathy. PMH: COPD, chronic respiratory failure on 2L,HTN, DM2, HFpEF   Clinical Impression   Pt was seen for OT evaluation this date. Prior to hospital admission, pt was generally independent with ADL at home and furniture walking. She reports living with her son but he works. Pt is a questionable historian and does appear to be somewhat confused during session despite being alert and oriented x4. Demonstrates decreased safety awareness. Pt received standing EOB attempting to untangle lines/leads and had removed her nasal cannula with attempts to find a bathroom. Supv for transfer to [facility] once set up. Pt presents to acute OT demonstrating impaired ADL performance and functional mobility 2/2 strength, balance, need for increased O2 from baseline, and poor cognition (See OT problem list for additional functional deficits). Pt currently requires supv for ADL mobility, supv for standing ADL, and PRN MIN A for LB ADL tasks.  Pt would benefit from skilled OT services to address noted impairments and functional limitations (see below for any additional details) in order to maximize safety and independence while minimizing falls risk and caregiver burden.   Addendum: Recommend higher level of care upon discharge should family be unable to provide at least initial 24/7 supv/assist. Would reccommend HHOT if pt has 24/7 supv/assist at home.    If plan is discharge home, recommend the following: A little help with walking and/or transfers;A little help with bathing/dressing/bathroom;Assistance with cooking/housework;Assist for transportation;Help with stairs or ramp for entrance;Direct supervision/assist for  medications management;Supervision due to cognitive status;Direct supervision/assist for financial management    Functional Status Assessment  Patient has had a recent decline in their functional status and demonstrates the ability to make significant improvements in function in a reasonable and predictable amount of time.  Equipment Recommendations  None recommended by OT    Recommendations for Other Services       Precautions / Restrictions Precautions Precautions: Fall Restrictions Weight Bearing Restrictions: No      Mobility Bed Mobility Overal bed mobility: Needs Assistance Bed Mobility: Supine to Sit     Supine to sit: Supervision          Transfers Overall transfer level: Needs assistance Equipment used: None Transfers: Sit to/from Stand Sit to Stand: Supervision           General transfer comment: Pt received standing beside ED bed attempting to untangle lines/leads and had removed her nasal cannula; transferred on/off BSC with supervision      Balance Overall balance assessment: Needs assistance Sitting-balance support: Feet supported Sitting balance-Leahy Scale: Good     Standing balance support: Single extremity supported, No upper extremity supported, During functional activity Standing balance-Leahy Scale: Fair                             ADL either performed or assessed with clinical judgement   ADL Overall ADL's : Needs assistance/impaired     Grooming: Standing;Supervision/safety;Wash/dry hands;Wash/dry face               Lower Body Dressing: Sit to/from stand;Minimal assistance   Toilet Transfer: Supervision/safety;BSC/3in1   Toileting- Architect and Hygiene: Supervision/safety;Set up;Sit to/from stand       Functional mobility during ADLs:  Supervision/safety       Vision         Perception         Praxis         Pertinent Vitals/Pain Pain Assessment Pain Assessment: No/denies pain      Extremity/Trunk Assessment Upper Extremity Assessment Upper Extremity Assessment: Generalized weakness   Lower Extremity Assessment Lower Extremity Assessment: Generalized weakness;Overall [facility] for tasks assessed       Communication Communication Communication: No apparent difficulties Cueing Techniques: Verbal cues   Cognition Arousal: Alert Behavior During Therapy: WFL for tasks assessed/performed Overall Cognitive Status: No family/caregiver present to determine baseline cognitive functioning                                 General Comments: alert and oriented x4 however confused and has difficult time answering PLOF questions     General Comments       Exercises     Shoulder Instructions      Home Living Family/patient expects to be discharged to:: Private residence Living Arrangements: Children (son) Available Help at Discharge: Family;Available PRN/intermittently (son works, leaves the house around 3-4 AM) Type of Home: Mobile home Home Access: Stairs to enter [employer] of Steps: 4-5 Entrance Stairs-Rails: Right;Left;Can reach both Home Layout: One level     Bathroom Shower/Tub: Chief Strategy Officer: Standard     Home Equipment: Grab bars - tub/shower          Prior Functioning/Environment Prior Level of Function : Needs assist             Mobility Comments: reports furniture walking, thinks she has fallen before but not sure ADLs Comments: Sits in the tub; reports using 4L "tries to", doesn't get dressed just wears a pajama gown during the day, indep with toileting, sister drives to appts, sister brings groceries, very light meal prep, reports being "all confused' with her medications        OT Problem List: Decreased strength;Decreased cognition;Decreased safety awareness;Impaired balance (sitting and/or standing);Decreased knowledge of use of DME or AE;Obesity;Decreased activity tolerance      OT  Treatment/Interventions: Self-care/ADL training;Therapeutic exercise;Therapeutic activities;Cognitive remediation/compensation;DME and/or AE instruction;Patient/family education;Balance training;Energy conservation    OT Goals(Current goals can be found in the care plan section) Acute Rehab OT Goals Patient Stated Goal: go home OT Goal Formulation: With patient Time For Goal Achievement: 10/17/23 Potential to Achieve Goals: Good ADL Goals Pt Will Perform Lower Body Dressing: with modified independence Pt Will Transfer to Toilet: with modified independence Pt Will Perform Toileting - Clothing Manipulation and hygiene: with modified independence Additional ADL Goal #1: Pt will demonstrate good safety awareness during ADL/mobility tasks requiring no VC for safety, 3/3 opportunities.  OT Frequency: Min 1X/week    Co-evaluation              AM-PAC OT "6 Clicks" Daily Activity     Outcome Measure Help from another person eating meals?: None Help from another person taking care of personal grooming?: A Little Help from another person toileting, which includes using toliet, bedpan, or urinal?: A Little Help from another person bathing (including washing, rinsing, drying)?: A Little Help from another person to put on and taking off regular upper body clothing?: None Help from another person to put on and taking off regular lower body clothing?: A Little 6 Click Score: 20   End of Session    Activity Tolerance:  Patient tolerated treatment well Patient left: Other (comment) (standing at sink with PT)  OT Visit Diagnosis: Other abnormalities of gait and mobility (R26.89);Muscle weakness (generalized) (M62.81)                Time: 2536-6440 OT Time Calculation (min): 21 min Charges:  OT General Charges $OT Visit: 1 Visit OT Evaluation $OT Eval Low Complexity: 1 Low  Arman Filter., MPH, MS, OTR/L ascom [phone] 10/03/23, 11:22 AM

## 2023-10-03 NOTE — Evaluation (Addendum)
Physical Therapy Evaluation Patient Details Name: Michele House MRN: [id number] DOB: [birth year] Today's Date: 10/03/2023  History of Present Illness  Pt is a 67 y/o F admitted on 10/02/23 after presenting with acute on chronic respiratory failure with hypoxia, COPD exacerbation, PNA, UTI, & encephalopathy. PMH: COPD, chronic respiratory failure on 2L,HTN, DM2, HFpEF  Clinical Impression  Pt seen for PT evaluation with pt agreeable to tx. Pt reports prior to admission she was living in a mobile home with her son who works during the day but her sister assists her with transportation. Pt reports she ambulates without AD but is unable to recall hx of falls or not. On this date, pt is able to ambulate without AD in the room with supervision but requests hand support to ambulate in hallway. Pt ambulates in hallway with RW & supervision after PT educates pt on use of AD. Will continue to follow pt acutely to address balance, strengthening, gait & stair negotiation.  Max HR 125 bpm Pt received on 6L/min via nasal cannula, weaned to 4L/min then 2L/min Pt stood at sink on 2L/min with lowest SpO2 89% Increased pt to 3L/min for gait & pt left on 3L/min throughout & at end of session with SpO2 >/= 90% PT provided cuing for pursed lip breathing PRN EMT covering pt made aware of SpO2    Addendum: At this time, will recommend SNF rehab upon d/c unless family/friends can provide necessary level of supervision at d/c, then pt can d/c home with HHPT f/u.      If plan is discharge home, recommend the following: Assistance with cooking/housework;Assist for transportation;Help with stairs or ramp for entrance;Supervision due to cognitive status;Direct supervision/assist for financial management;Direct supervision/assist for medications management;A little help with walking and/or transfers;A little help with bathing/dressing/bathroom   Can travel by private vehicle   Yes    Equipment Recommendations  Rolling walker (2 wheels);BSC/3in1  Recommendations for Other Services       Functional Status Assessment Patient has had a recent decline in their functional status and demonstrates the ability to make significant improvements in function in a reasonable and predictable amount of time.     Precautions / Restrictions Precautions Precautions: Fall Restrictions Weight Bearing Restrictions: No      Mobility  Bed Mobility   Bed Mobility: Sit to Supine       Sit to supine: Modified independent (Device/Increase time) (extra time)        Transfers Overall transfer level: Needs assistance Equipment used: None Transfers: Sit to/from Stand Sit to Stand: Supervision           General transfer comment: STS from [facility]    Ambulation/Gait Ambulation/Gait assistance: Supervision Gait Distance (Feet): 10 Feet (+ 120 ft) Assistive device: None, Rolling walker (2 wheels) Gait Pattern/deviations: Decreased stride length, Step-through pattern, Decreased step length - right, Decreased step length - left Gait velocity: decreased     General Gait Details: Pt ambulates in room without AD with pt intermittently holding to furniture but does not appear to be for a lot of support but when PT asked pt about walking in the hallway pt reports she needs something to hold to. Provided pt with RW & educated pt on use of AD; pt able to ambulate with RW & supervision.  Stairs            Wheelchair Mobility     Tilt Bed    Modified Rankin (Stroke Patients Only)       Balance Overall  balance assessment: Needs assistance Sitting-balance support: Feet supported Sitting balance-Leahy Scale: Good     Standing balance support: No upper extremity supported, During functional activity Standing balance-Leahy Scale: Fair Standing balance comment: performs hand hygiene standing at sink with supervision, no LOB.                             Pertinent Vitals/Pain Pain  Assessment Pain Assessment: Faces Faces Pain Scale: Hurts a little bit Pain Location: back when standing at sink Pain Descriptors / Indicators: Discomfort Pain Intervention(s): Monitored during session    Home Living Family/patient expects to be discharged to:: Private residence Living Arrangements: Children (son) Available Help at Discharge: Family;Available PRN/intermittently (son works, leaves the house around 3-4 AM) Type of Home: Mobile home Home Access: Stairs to enter Entrance Stairs-Rails: Right;Left;Can reach both Secretary/administrator of Steps: 4-5   Home Layout: One level Home Equipment: Grab bars - tub/shower      Prior Function Prior Level of Function : Needs assist             Mobility Comments: reports furniture walking, thinks she has fallen before but not sure ADLs Comments: Sits in the tub; reports using 4L "tries to", doesn't get dressed just wears a pajama gown during the day, indep with toileting, sister drives to appts, sister brings groceries, very light meal prep, reports being "all confused' with her medications     Extremity/Trunk Assessment   Upper Extremity Assessment Upper Extremity Assessment: Generalized weakness    Lower Extremity Assessment Lower Extremity Assessment: Generalized weakness;Overall WFL for tasks assessed       Communication   Communication Communication: No apparent difficulties Cueing Techniques: Verbal cues  Cognition Arousal: Alert Behavior During Therapy: WFL for tasks assessed/performed Overall Cognitive Status: No family/caregiver present to determine baseline cognitive functioning                                 General Comments: Pt is AxOx4 but confused & has difficulty reporting PLOF. Pt also reports recent hx of visual hallucinations - MD made aware.        General Comments      Exercises     Assessment/Plan    PT Assessment Patient needs continued PT services  PT Problem List  Decreased strength;Decreased activity tolerance;Decreased balance;Decreased mobility;Cardiopulmonary status limiting activity;Decreased knowledge of use of DME;Decreased safety awareness;Decreased cognition       PT Treatment Interventions DME instruction;Therapeutic exercise;Gait training;Balance training;Stair training;Neuromuscular re-education;Functional mobility training;Therapeutic activities;Patient/family education;Cognitive remediation    PT Goals (Current goals can be found in the Care Plan section)  Acute Rehab PT Goals Patient Stated Goal: none stated PT Goal Formulation: With patient Time For Goal Achievement: 10/17/23 Potential to Achieve Goals: Good    Frequency Min 1X/week     Co-evaluation PT/OT/SLP Co-Evaluation/Treatment:  (overlap in OT/PT evaluation)             AM-PAC PT "6 Clicks" Mobility  Outcome Measure Help needed turning from your back to your side while in a flat bed without using bedrails?: None Help needed moving from lying on your back to sitting on the side of a flat bed without using bedrails?: A Little Help needed moving to and from a bed to a chair (including a wheelchair)?: A Little Help needed standing up from a chair using your arms (e.g., wheelchair or bedside chair)?: A Little Help  needed to walk in hospital room?: A Little Help needed climbing 3-5 steps with a railing? : A Little 6 Click Score: 19    End of Session Equipment Utilized During Treatment: Oxygen Activity Tolerance: Patient tolerated treatment well Patient left: in bed;with call bell/phone within reach;with bed alarm set Nurse Communication: Mobility status PT Visit Diagnosis: Unsteadiness on feet (R26.81);Other abnormalities of gait and mobility (R26.89);Muscle weakness (generalized) (M62.81);Difficulty in walking, not elsewhere classified (R26.2)    Time: 1610-9604 PT Time Calculation (min) (ACUTE ONLY): 27 min   Charges:   PT Evaluation $PT Eval Low Complexity: 1  Low   PT General Charges $$ ACUTE PT VISIT: 1 Visit         Aleda Grana, PT, DPT 10/03/23, 11:20 AM   Sandi Mariscal 10/03/2023, 11:20 AM

## 2023-10-03 NOTE — ED Notes (Signed)
Pt given of orange juice for blood glucose of 58.

## 2023-10-03 NOTE — ED Notes (Signed)
Answered call light, pt advised need to go to the bathroom, assisted. Hooked pt back up to monitor.

## 2023-10-04 DIAGNOSIS — G9340 Encephalopathy, unspecified: Secondary | ICD-10-CM | POA: Diagnosis not present

## 2023-10-04 DIAGNOSIS — J441 Chronic obstructive pulmonary disease with (acute) exacerbation: Secondary | ICD-10-CM | POA: Diagnosis not present

## 2023-10-04 DIAGNOSIS — J9621 Acute and chronic respiratory failure with hypoxia: Secondary | ICD-10-CM | POA: Diagnosis not present

## 2023-10-04 DIAGNOSIS — N390 Urinary tract infection, site not specified: Secondary | ICD-10-CM | POA: Diagnosis not present

## 2023-10-04 LAB — URINE CULTURE: Culture: >=100000 — AB

## 2023-10-04 LAB — GLUCOSE, CAPILLARY
Glucose-Capillary: 323 mg/dL — ABNORMAL HIGH (ref 70–99)
Glucose-Capillary: 185 mg/dL — ABNORMAL HIGH (ref 70–99)
Glucose-Capillary: 138 mg/dL — ABNORMAL HIGH (ref 70–99)
Glucose-Capillary: 172 mg/dL — ABNORMAL HIGH (ref 70–99)

## 2023-10-04 NOTE — Assessment & Plan Note (Signed)
Streptococcus agalactiae growing out of urine culture.  Would be treated with Rocephin.

## 2023-10-04 NOTE — Progress Notes (Signed)
Progress Note   Patient: Michele House DOB: 1956-08-21 DOA: 10/02/2023     2 DOS: the patient was seen and examined on 10/04/2023   Brief hospital course: Taken from H&P.  Michele House is a 67 y.o. female with medical history significant of COPD, chronic respiratory failure on 2 L, hypertension, type 2 diabetes, HFpEF presenting with acute on chronic respiratory failure with hypoxia, COPD exacerbation, pneumonia, UTI, encephalopathy.  Patient reports generalized malaise over the past 3 to 4 days.  Patient noted to have been recently admitted in the hospital November 21 through November 24 for similar issues including COPD exacerbation.  Patient reports not taking her medication since discharge.  Presented to the ER afebrile, heart rate 100s, BP stable.  Satting 96% on 3 L nasal cannula.  White count 23.4, hemoglobin 15, platelets 401, lactate 1.6, procalcitonin less than 0.1, troponin 18, urinalysis indicative of infection.  Urine drug screen negative.  Chest x-ray with a right sided pneumonia.  BNP within normal limits.  Creatinine 1.03.  Glucose 368.EKG sinus tachycardia.  Admitted for concern of pneumonia/COPD exacerbation and UTI. Started on Rocephin and azithromycin.  12/1: Vital stable, respiratory viral panel negative, procalcitonin negative, preliminary blood cultures negative.  Urine cultures are ordered as add-on, ammonia levels mildly elevated at 39, strep pneumo negative, CBG elevated at 244, history of enlarging pulmonary nodule with recommendations for PET/CT during most recent admission which has not been done yet, if video bronchoscopy was scheduled for 10/13/2023 as outpatient.  Unable to take care of himself and son cannot provide the required care as she required 24-hour supervision due to worsening dementia.  PT is recommending SNF.  12/2: Hemodynamically stable, now on baseline oxygen of 2 L.  Needs SNF placement. Urine cultures pending.        Assessment and Plan: * Encephalopathy Mild slowed mentation on presentation with otherwise nonfocal neuroexam Oriented x 4 though with delayed responsiveness CT head within normal limits Suspect multifactorial in Setting of decompensated respiratory failure with hypoxia, pneumonia, COPD exacerbation, UTI No overt hypercarbia on blood gas Blood sugar in 360s which is also a confounding issue Mildly elevated ammonia. Unable to take care of herself, concern of advancing dementia. PT/OT are recommending rehab.  Patient requires 24/7 care   Acute on chronic respiratory failure with hypoxia (HCC) Decompensated respiratory failure, requiring 3 L nasal cannula in setting of baseline 2 L nasal cannula use on admission. Currently back to baseline oxygen of 2 L Chest x-ray with right lung pneumonia Overlapping COPD exacerbation Continue supplemental oxygen-  COPD with acute exacerbation (HCC) Positive cough, wheezing, increased sputum production as well as shortness of breath in the setting of baseline COPD with chronic respiratory failure on 2 L Now on 3 L Chest x-ray with overlapping pneumonia IV Solu-Medrol DuoNebs Continue with Zithromax Continue supplemental oxygen Monitor  Pneumonia Concern of pneumonia on admission but procalcitonin negative. History of lung nodule patient need outpatient PET scan on most recent hospitalization A video bronchoscopy scheduled outpatient for 12/11 -Continue with Zithromax for concern of COPD exacerbation -Continuing ceftriaxone for concern of UTI  UTI (urinary tract infection) Noted mild encephalopathy with dysuria and urinalysis indicative of infection IV Rocephin  Follow-up urine culture Monitor  (HFpEF) heart failure with preserved ejection fraction (HCC) 2D echo 09/2023 showed LVEF 55 to 60%, no regional wall motion abnormalities  Appears fairly euvolemic at present Monitor volume status with treatment Follow  Essential  hypertension BP stable Titrate home regimen  Type  2 diabetes mellitus with obesity (HCC) Blood sugar in 360s on presentation SSI Monitor blood sugars with steroid use Follow   Subjective: Patient remained slow but able to answer questions appropriately.  She agrees to go to rehab.  Physical Exam: Vitals:   10/03/23 1500 10/03/23 1530 10/03/23 1800 10/04/23 0858  BP: (!) 152/80 (!) 141/75 (!) 151/107 (!) 153/76  Pulse: (!) 109 (!) 109 100 100  Resp: (!) 21 (!) 23 20 16   Temp:   97.7 F (36.5 C) 97.6 F (36.4 C)  TempSrc:    Oral  SpO2: 100% 100% 100% 97%   General.  Frail lady, in no acute distress. Pulmonary.  Lungs clear bilaterally, normal respiratory effort. CV.  Regular rate and rhythm, no JVD, rub or murmur. Abdomen.  Soft, nontender, nondistended, BS positive. CNS.  Alert and oriented .  No focal neurologic deficit. Extremities.  No edema, no cyanosis, pulses intact and symmetrical.   Data Reviewed: Prior data reviewed  Family Communication: Talked with daughter on phone.  Disposition: Status is: Inpatient Remains inpatient appropriate because: Severity of illness  Planned Discharge Destination: Rehab  DVT prophylaxis. Lovenox  Time spent: 44 minutes  This record has been created using Conservation officer, historic buildings. Errors have been sought and corrected,but may not always be located. Such creation errors do not reflect on the standard of care.   Author: Arnetha Courser, MD 10/04/2023 2:35 PM  For on call review [email or web address].

## 2023-10-04 NOTE — Progress Notes (Signed)
Physical Therapy Treatment Patient Details Name: Michele House MRN: [id number] DOB: [birth year] Today's Date: 10/04/2023   History of Present Illness Pt is a 66 y/o F admitted on 10/02/23 after presenting with acute on chronic respiratory failure with hypoxia, COPD exacerbation, PNA, UTI, & encephalopathy. PMH: COPD, chronic respiratory failure on 2L,HTN, DM2, HFpEF    PT Comments  Pt seen for PT tx with pt agreeable. Pt reports she's bothered by something but unable to state what, reports she's hearing noises. Pt follows simple commands inconsistently throughout session with extra time. Pt with decreased awareness re: rollator brake management, requiring assistance at times. Pt ambulates room<>gym with rollator & supervision & negotiates stairs with B rails & supervision. Pt appears to be mainly limited by cognition. Will continue to follow pt acutely to address balance, stair negotiation & safety with mobility.    If plan is discharge home, recommend the following: A little help with bathing/dressing/bathroom;A little help with walking and/or transfers;Assistance with cooking/housework;Supervision due to cognitive status;Direct supervision/assist for financial management;Help with stairs or ramp for entrance;Assist for transportation   Can travel by private vehicle     Yes  Equipment Recommendations  Rolling walker (2 wheels)    Recommendations for Other Services       Precautions / Restrictions Precautions Precautions: Fall Restrictions Weight Bearing Restrictions: No     Mobility  Bed Mobility               General bed mobility comments: not tested, pt received & left sitting in recliner    Transfers Overall transfer level: Needs assistance Equipment used: Rollator (4 wheels) Transfers: Sit to/from Stand Sit to Stand: Supervision           General transfer comment: STS from recliner, decreased awareness re: rollator brake management     Ambulation/Gait Ambulation/Gait assistance: Supervision Gait Distance (Feet): 150 Feet (150 ft) Assistive device: Rollator (4 wheels) Gait Pattern/deviations: Decreased step length - right, Decreased stride length, Decreased step length - left Gait velocity: decreased     General Gait Details: ongoing cuing to continue walking as pt will simply stop   Stairs Stairs: Yes Stairs assistance: Supervision Stair Management: Two rails, Step to pattern, Forwards Number of Stairs: 4 (6") General stair comments: Pt negotiates 4 steps with supervision, step to. Halfway down steps pt stops & reports feeling nervous re: falling. Pt continues to stare at PT vs visually looking at step with eyes despite ongoing cuing with pt stating "I just see you" & not comprehending need to actually look at step. Pt eventually descended last two steps but does not look down at step & instead feels with feet.   Wheelchair Mobility     Tilt Bed    Modified Rankin (Stroke Patients Only)       Balance Overall balance assessment: Needs assistance Sitting-balance support: Feet supported Sitting balance-Leahy Scale: Good     Standing balance support: Bilateral upper extremity supported, During functional activity, Reliant on assistive device for balance Standing balance-Leahy Scale: Fair                              Cognition Arousal: Alert Behavior During Therapy: WFL for tasks assessed/performed                                   General Comments: Pt confused, reporting she's bothered by something  but unable to state what. Pt reports hearing things.        Exercises Other Exercises Other Exercises: Pt noted to be incontinent of urine & PT assisted pt with changing into clean gown.    General Comments General comments (skin integrity, edema, etc.): Pt on 3L/min via nasal cannula, SpO2 96% HR 110 bpm at end of session      Pertinent Vitals/Pain Pain Assessment Pain  Assessment: No/denies pain    Home Living                          Prior Function            PT Goals (current goals can now be found in the care plan section) Acute Rehab PT Goals Patient Stated Goal: none stated PT Goal Formulation: With patient Time For Goal Achievement: 10/17/23 Potential to Achieve Goals: Good Progress towards PT goals: Progressing toward goals    Frequency    Min 1X/week      PT Plan      Co-evaluation              AM-PAC PT "6 Clicks" Mobility   Outcome Measure  Help needed turning from your back to your side while in a flat bed without using bedrails?: None Help needed moving from lying on your back to sitting on the side of a flat bed without using bedrails?: A Little Help needed moving to and from a bed to a chair (including a wheelchair)?: A Little Help needed standing up from a chair using your arms (e.g., wheelchair or bedside chair)?: A Little Help needed to walk in hospital room?: A Little Help needed climbing 3-5 steps with a railing? : A Little 6 Click Score: 19    End of Session Equipment Utilized During Treatment: Oxygen Activity Tolerance: Patient tolerated treatment well Patient left: in chair;with chair alarm set;with call bell/phone within reach Nurse Communication: Mobility status PT Visit Diagnosis: Unsteadiness on feet (R26.81);Other abnormalities of gait and mobility (R26.89);Muscle weakness (generalized) (M62.81);Difficulty in walking, not elsewhere classified (R26.2)     Time: 1610-9604 PT Time Calculation (min) (ACUTE ONLY): 22 min  Charges:    $Therapeutic Activity: 8-22 mins PT General Charges $$ ACUTE PT VISIT: 1 Visit                     Aleda Grana, PT, DPT 10/04/23, 3:39 PM    Sandi Mariscal 10/04/2023, 3:38 PM

## 2023-10-04 NOTE — Assessment & Plan Note (Addendum)
Patient initially required 3 L now on 1 L.

## 2023-10-04 NOTE — Assessment & Plan Note (Signed)
Concern of pneumonia on admission but procalcitonin negative. History of lung nodule patient need outpatient PET scan on most recent hospitalization A video bronchoscopy scheduled outpatient for 12/11 -Continue with Zithromax for concern of COPD exacerbation -Continuing ceftriaxone for concern of UTI

## 2023-10-04 NOTE — Plan of Care (Signed)
  Problem: Education: Goal: Ability to describe self-care measures that may prevent or decrease complications (Diabetes Survival Skills Education) will improve Outcome: Progressing Goal: Individualized Educational Video(s) Outcome: Progressing   Problem: Coping: Goal: Ability to adjust to condition or change in health will improve Outcome: Progressing   Problem: Fluid Volume: Goal: Ability to maintain a balanced intake and output will improve Outcome: Progressing   Problem: Health Behavior/Discharge Planning: Goal: Ability to identify and utilize available resources and services will improve Outcome: Progressing Goal: Ability to manage health-related needs will improve Outcome: Progressing   Problem: Metabolic: Goal: Ability to maintain appropriate glucose levels will improve Outcome: Progressing   Problem: Nutritional: Goal: Maintenance of adequate nutrition will improve Outcome: Progressing Goal: Progress toward achieving an optimal weight will improve Outcome: Progressing   Problem: Skin Integrity: Goal: Risk for impaired skin integrity will decrease Outcome: Progressing   Problem: Tissue Perfusion: Goal: Adequacy of tissue perfusion will improve Outcome: Progressing   Problem: Education: Goal: Knowledge of General Education information will improve Description: Including pain rating scale, medication(s)/side effects and non-pharmacologic comfort measures Outcome: Progressing   Problem: Health Behavior/Discharge Planning: Goal: Ability to manage health-related needs will improve Outcome: Progressing   Problem: Clinical Measurements: Goal: Ability to maintain clinical measurements within normal limits will improve Outcome: Progressing Goal: Will remain free from infection Outcome: Progressing Goal: Diagnostic test results will improve Outcome: Progressing Goal: Respiratory complications will improve Outcome: Progressing Goal: Cardiovascular complication will  be avoided Outcome: Progressing   Problem: Activity: Goal: Risk for activity intolerance will decrease Outcome: Progressing   Problem: Nutrition: Goal: Adequate nutrition will be maintained Outcome: Progressing   Problem: Coping: Goal: Level of anxiety will decrease Outcome: Progressing   Problem: Elimination: Goal: Will not experience complications related to bowel motility Outcome: Progressing Goal: Will not experience complications related to urinary retention Outcome: Progressing   Problem: Pain Management: Goal: General experience of comfort will improve Outcome: Progressing   Problem: Safety: Goal: Ability to remain free from injury will improve Outcome: Progressing   Problem: Skin Integrity: Goal: Risk for impaired skin integrity will decrease Outcome: Progressing   Problem: Education: Goal: Knowledge of disease or condition will improve Outcome: Progressing Goal: Knowledge of the prescribed therapeutic regimen will improve Outcome: Progressing Goal: Individualized Educational Video(s) Outcome: Progressing   Problem: Activity: Goal: Ability to tolerate increased activity will improve Outcome: Progressing Goal: Will verbalize the importance of balancing activity with adequate rest periods Outcome: Progressing   Problem: Respiratory: Goal: Ability to maintain a clear airway will improve Outcome: Progressing Goal: Levels of oxygenation will improve Outcome: Progressing Goal: Ability to maintain adequate ventilation will improve Outcome: Progressing   Problem: Activity: Goal: Ability to tolerate increased activity will improve Outcome: Progressing   Problem: Clinical Measurements: Goal: Ability to maintain a body temperature in the normal range will improve Outcome: Progressing   Problem: Respiratory: Goal: Ability to maintain adequate ventilation will improve Outcome: Progressing Goal: Ability to maintain a clear airway will improve Outcome:  Progressing

## 2023-10-05 DIAGNOSIS — G9340 Encephalopathy, unspecified: Secondary | ICD-10-CM | POA: Diagnosis not present

## 2023-10-05 DIAGNOSIS — J441 Chronic obstructive pulmonary disease with (acute) exacerbation: Secondary | ICD-10-CM | POA: Diagnosis not present

## 2023-10-05 DIAGNOSIS — J9621 Acute and chronic respiratory failure with hypoxia: Secondary | ICD-10-CM | POA: Diagnosis not present

## 2023-10-05 DIAGNOSIS — N390 Urinary tract infection, site not specified: Secondary | ICD-10-CM | POA: Diagnosis not present

## 2023-10-05 LAB — GLUCOSE, CAPILLARY
Glucose-Capillary: 270 mg/dL — ABNORMAL HIGH (ref 70–99)
Glucose-Capillary: 188 mg/dL — ABNORMAL HIGH (ref 70–99)
Glucose-Capillary: 180 mg/dL — ABNORMAL HIGH (ref 70–99)

## 2023-10-05 MED ORDER — MIRTAZAPINE 15 MG PO TBDP
15.0000 mg | ORAL_TABLET | Freq: Every day | ORAL | Status: DC
  Administered 2023-10-05: 15 mg via ORAL
  Filled 2023-10-05: qty 1

## 2023-10-05 MED ORDER — HYDROCHLOROTHIAZIDE 25 MG PO TABS
25.0000 mg | ORAL_TABLET | Freq: Every day | ORAL | Status: DC
  Administered 2023-10-05 – 2023-10-06 (×2): 25 mg via ORAL
  Filled 2023-10-05 (×3): qty 1

## 2023-10-05 MED ORDER — LISINOPRIL 20 MG PO TABS
20.0000 mg | ORAL_TABLET | Freq: Every day | ORAL | Status: DC
  Administered 2023-10-05 – 2023-10-06 (×2): 20 mg via ORAL
  Filled 2023-10-05 (×3): qty 1

## 2023-10-05 MED ORDER — LISINOPRIL-HYDROCHLOROTHIAZIDE 20-25 MG PO TABS: 1.0000 | ORAL_TABLET | Freq: Every day | ORAL | Status: DC

## 2023-10-05 NOTE — Progress Notes (Signed)
Progress Note   Patient: Michele House:[id number] DOB: April 29, 1956 DOA: 10/02/2023     3 DOS: the patient was seen and examined on 10/05/2023   Brief hospital course: Taken from H&P.  Michele House is a 67 y.o. female with medical history significant of COPD, chronic respiratory failure on 2 L, hypertension, type 2 diabetes, HFpEF presenting with acute on chronic respiratory failure with hypoxia, COPD exacerbation, pneumonia, UTI, encephalopathy.  Patient reports generalized malaise over the past 3 to 4 days.  Patient noted to have been recently admitted in the hospital November 21 through November 24 for similar issues including COPD exacerbation.  Patient reports not taking her medication since discharge.  Presented to the ER afebrile, heart rate 100s, BP stable.  Satting 96% on 3 L nasal cannula.  White count 23.4, hemoglobin 15, platelets 401, lactate 1.6, procalcitonin less than 0.1, troponin 18, urinalysis indicative of infection.  Urine drug screen negative.  Chest x-ray with a right sided pneumonia.  BNP within normal limits.  Creatinine 1.03.  Glucose 368.EKG sinus tachycardia.  Admitted for concern of pneumonia/COPD exacerbation and UTI. Started on Rocephin and azithromycin.  12/1: Vital stable, respiratory viral panel negative, procalcitonin negative, preliminary blood cultures negative.  Urine cultures are ordered as add-on, ammonia levels mildly elevated at 39, strep pneumo negative, CBG elevated at 244, history of enlarging pulmonary nodule with recommendations for PET/CT during most recent admission which has not been done yet, if video bronchoscopy was scheduled for 10/13/2023 as outpatient.  Unable to take care of himself and son cannot provide the required care as she required 24-hour supervision due to worsening dementia.  PT is recommending SNF.  12/2: Hemodynamically stable, now on baseline oxygen of 2 L.  Needs SNF placement. Urine cultures pending.  12/3: Blood  pressure started trending up, restarting home Zestoretic.  Urine cultures with strep agalactiae, penicillin allergy noted, will continue with ceftriaxone for now.  Also started on Remeron for concern of worsening depression and unable to sleep at night Awaiting placement.    Assessment and Plan: * Encephalopathy Mild slowed mentation on presentation with otherwise nonfocal neuroexam Oriented x 4 though with delayed responsiveness CT head within normal limits Suspect multifactorial in Setting of decompensated respiratory failure with hypoxia, pneumonia, COPD exacerbation, UTI No overt hypercarbia on blood gas Blood sugar in 360s which is also a confounding issue Mildly elevated ammonia. Unable to take care of herself, concern of advancing dementia. PT/OT are recommending rehab.  Patient requires 24/7 care   Acute on chronic respiratory failure with hypoxia (HCC) Decompensated respiratory failure, requiring 3 L nasal cannula in setting of baseline 2 L nasal cannula use on admission. Currently back to baseline oxygen of 2 L Chest x-ray with right lung pneumonia Overlapping COPD exacerbation Continue supplemental oxygen-  COPD with acute exacerbation (HCC) Positive cough, wheezing, increased sputum production as well as shortness of breath in the setting of baseline COPD with chronic respiratory failure on 2 L Now on 3 L Chest x-ray with overlapping pneumonia IV Solu-Medrol DuoNebs Continue with Zithromax Continue supplemental oxygen Monitor  Pneumonia Concern of pneumonia on admission but procalcitonin negative. History of lung nodule patient need outpatient PET scan on most recent hospitalization A video bronchoscopy scheduled outpatient for 12/11 -Continue with Zithromax for concern of COPD exacerbation -Continuing ceftriaxone for concern of UTI  UTI (urinary tract infection) Noted mild encephalopathy with dysuria and urinalysis indicative of infection, urine cultures with  strep agalactia. Penicillin allergy noted -Continue IV Rocephin    (  HFpEF) heart failure with preserved ejection fraction (HCC) 2D echo 09/2023 showed LVEF 55 to 60%, no regional wall motion abnormalities  Appears fairly euvolemic at present Monitor volume status with treatment Follow  Essential hypertension BP stable Titrate home regimen  Type 2 diabetes mellitus with obesity (HCC) Blood sugar in 360s on presentation SSI Monitor blood sugars with steroid use Follow   Subjective: Patient was seen and examined today.  No new concern, remained very slow and stating that she is still confused, able to answer all questions appropriately.  Son at bedside concerned about worsening depression due to prior history.  Stating that she was quite independent with no diagnosis of dementia.  Physical Exam: Vitals:   10/04/23 2251 10/05/23 0807 10/05/23 0912 10/05/23 1427  BP: (!) 156/93 (!) 176/75  (!) 164/85  Pulse: 96 99  (!) 101  Resp:  17  18  Temp:  (!) 97.5 F (36.4 C)  97.7 F (36.5 C)  TempSrc:      SpO2:  100%  98%  Weight:   74.1 kg   Height:   5' 0.98" (1.549 m)    General.  Frail lady, in no acute distress. Pulmonary.  Lungs clear bilaterally, normal respiratory effort. CV.  Regular rate and rhythm, no JVD, rub or murmur. Abdomen.  Soft, nontender, nondistended, BS positive. CNS.  Alert and oriented .  No focal neurologic deficit. Extremities.  No edema, no cyanosis, pulses intact and symmetrical.   Data Reviewed: Prior data reviewed  Family Communication: Discussed with son at bedside  Disposition: Status is: Inpatient Remains inpatient appropriate because: Severity of illness  Planned Discharge Destination: Rehab  DVT prophylaxis. Lovenox  Time spent: 43 minutes  This record has been created using Conservation officer, historic buildings. Errors have been sought and corrected,but may not always be located. Such creation errors do not reflect on the standard of  care.   Author: Arnetha Courser, MD 10/05/2023 3:07 PM  For on call review [email or web address].

## 2023-10-05 NOTE — Inpatient Diabetes Management (Signed)
Inpatient Diabetes Program Recommendations  AACE/ADA: New Consensus Statement on Inpatient Glycemic Control (2015)  Target Ranges:  Prepandial:   less than 140 mg/dL      Peak postprandial:   less than 180 mg/dL (1-2 hours)      Critically ill patients:  140 - 180 mg/dL    Latest Reference Range & Units 10/04/23 08:21 10/04/23 12:12 10/04/23 17:30 10/04/23 21:36  Glucose-Capillary 70 - 99 mg/dL 098 (H)  15 units Novolog  185 (H)  7 units Novolog  138 (H)  6 units Novolog  172 (H)  (H): Data is abnormally high  Latest Reference Range & Units 10/05/23 08:08 10/05/23 11:34  Glucose-Capillary 70 - 99 mg/dL 119 (H)  12 units Novolog  188 (H)  (H): Data is abnormally high     Home DM Meds: Glipizide 10 mg daily + Tradjenta 5 mg daily     Current Orders: Novolog 0-15 units TID ac/hs    Novolog 4 units TID with meals   Pt was getting Solumedrol--Switched to Prednisone 40 mg daily  MD- Note AM CBGs are >200.  Home oral meds on hold.  Please consider adding Semglee 10 units at bedtime (0.15 units/kg)    --Will follow patient during hospitalization--  Ambrose Finland RN, MSN, CDCES Diabetes Coordinator Inpatient Glycemic Control Team Team Pager: [phone] (8a-5p)

## 2023-10-05 NOTE — Plan of Care (Signed)
  Problem: Education: Goal: Ability to describe self-care measures that may prevent or decrease complications (Diabetes Survival Skills Education) will improve Outcome: Progressing Goal: Individualized Educational Video(s) Outcome: Progressing   Problem: Coping: Goal: Ability to adjust to condition or change in health will improve Outcome: Progressing   Problem: Fluid Volume: Goal: Ability to maintain a balanced intake and output will improve Outcome: Progressing   Problem: Metabolic: Goal: Ability to maintain appropriate glucose levels will improve Outcome: Progressing   Problem: Nutritional: Goal: Maintenance of adequate nutrition will improve Outcome: Progressing Goal: Progress toward achieving an optimal weight will improve Outcome: Progressing   Problem: Education: Goal: Knowledge of General Education information will improve Description: Including pain rating scale, medication(s)/side effects and non-pharmacologic comfort measures Outcome: Progressing   Problem: Health Behavior/Discharge Planning: Goal: Ability to manage health-related needs will improve Outcome: Progressing   Problem: Activity: Goal: Risk for activity intolerance will decrease Outcome: Progressing   Problem: Nutrition: Goal: Adequate nutrition will be maintained Outcome: Progressing   Problem: Safety: Goal: Ability to remain free from injury will improve Outcome: Progressing   Problem: Skin Integrity: Goal: Risk for impaired skin integrity will decrease Outcome: Progressing

## 2023-10-06 ENCOUNTER — Inpatient Hospital Stay
Admission: RE | Admit: 2023-10-06 | Discharge: 2023-10-06 | Disposition: A | Discharge status: Home / Self Care | Payer: 59 | Source: Ambulatory Visit

## 2023-10-06 ENCOUNTER — Inpatient Hospital Stay: Payer: 59

## 2023-10-06 DIAGNOSIS — J9621 Acute and chronic respiratory failure with hypoxia: Secondary | ICD-10-CM | POA: Diagnosis not present

## 2023-10-06 DIAGNOSIS — N3001 Acute cystitis with hematuria: Secondary | ICD-10-CM

## 2023-10-06 DIAGNOSIS — E669 Obesity, unspecified: Secondary | ICD-10-CM

## 2023-10-06 DIAGNOSIS — J441 Chronic obstructive pulmonary disease with (acute) exacerbation: Secondary | ICD-10-CM | POA: Diagnosis not present

## 2023-10-06 DIAGNOSIS — I10 Essential (primary) hypertension: Secondary | ICD-10-CM

## 2023-10-06 DIAGNOSIS — G9341 Metabolic encephalopathy: Secondary | ICD-10-CM

## 2023-10-06 DIAGNOSIS — I5032 Chronic diastolic (congestive) heart failure: Secondary | ICD-10-CM

## 2023-10-06 DIAGNOSIS — E1169 Type 2 diabetes mellitus with other specified complication: Secondary | ICD-10-CM

## 2023-10-06 LAB — BASIC METABOLIC PANEL
Anion gap: 12 (ref 5–15)
BUN: 33 mg/dL — ABNORMAL HIGH (ref 8–23)
CO2: 29 mmol/L (ref 22–32)
Calcium: 10 mg/dL (ref 8.9–10.3)
Chloride: 101 mmol/L (ref 98–111)
Creatinine, Ser: 0.98 mg/dL (ref 0.44–1.00)
GFR, Estimated: >60 mL/min (ref >60)
Glucose, Bld: 187 mg/dL — ABNORMAL HIGH (ref 70–99)
Potassium: 3.8 mmol/L (ref 3.5–5.1)
Sodium: 142 mmol/L (ref 135–145)

## 2023-10-06 LAB — CBC
HCT: 47.3 % — ABNORMAL HIGH (ref 36.0–46.0)
Hemoglobin: 15.3 g/dL — ABNORMAL HIGH (ref 12.0–15.0)
MCH: 29.1 pg (ref 26.0–34.0)
MCHC: 32.3 g/dL (ref 30.0–36.0)
MCV: 90.1 fL (ref 80.0–100.0)
Platelets: 357 10*3/uL (ref 150–400)
RBC: 5.25 MIL/uL — ABNORMAL HIGH (ref 3.87–5.11)
RDW: 13.7 % (ref 11.5–15.5)
WBC: 16.6 10*3/uL — ABNORMAL HIGH (ref 4.0–10.5)
nRBC: 0 % (ref 0.0–0.2)

## 2023-10-06 LAB — GLUCOSE, CAPILLARY
Glucose-Capillary: 181 mg/dL — ABNORMAL HIGH (ref 70–99)
Glucose-Capillary: 244 mg/dL — ABNORMAL HIGH (ref 70–99)
Glucose-Capillary: 174 mg/dL — ABNORMAL HIGH (ref 70–99)
Glucose-Capillary: 141 mg/dL — ABNORMAL HIGH (ref 70–99)

## 2023-10-06 LAB — CK: Total CK: 63 U/L (ref 38–234)

## 2023-10-06 LAB — LEGIONELLA PNEUMOPHILA SEROGP 1 UR AG: L. pneumophila Serogp 1 Ur Ag: NEGATIVE

## 2023-10-06 LAB — T4, FREE: Free T4: 1.25 ng/dL — ABNORMAL HIGH (ref 0.61–1.12)

## 2023-10-06 LAB — TSH: TSH: 3.002 u[IU]/mL (ref 0.350–4.500)

## 2023-10-06 MED ORDER — SERTRALINE HCL 50 MG PO TABS: 25.0000 mg | ORAL_TABLET | Freq: Every day | ORAL | Status: DC

## 2023-10-06 MED ORDER — SERTRALINE HCL 50 MG PO TABS
25.0000 mg | ORAL_TABLET | Freq: Every day | ORAL | Status: DC
  Administered 2023-10-07 – 2023-10-28 (×21): 25 mg via ORAL
  Filled 2023-10-06 (×22): qty 1

## 2023-10-06 MED ORDER — QUETIAPINE FUMARATE 25 MG PO TABS
12.5000 mg | ORAL_TABLET | Freq: Every day | ORAL | Status: DC
  Filled 2023-10-06: qty 1

## 2023-10-06 MED ORDER — LORAZEPAM 2 MG/ML IJ SOLN
0.5000 mg | Freq: Once | INTRAMUSCULAR | Status: AC | PRN
  Administered 2023-10-06: 0.5 mg via INTRAVENOUS
  Filled 2023-10-06: qty 1

## 2023-10-06 MED ORDER — INSULIN GLARGINE-YFGN 100 UNIT/ML ~~LOC~~ SOLN
10.0000 [IU] | Freq: Every day | SUBCUTANEOUS | Status: DC
  Administered 2023-10-06: 10 [IU] via SUBCUTANEOUS
  Filled 2023-10-06 (×2): qty 0.1

## 2023-10-06 MED ORDER — SODIUM CHLORIDE 0.9 % IV BOLUS
500.0000 mL | Freq: Once | INTRAVENOUS | Status: AC
  Administered 2023-10-06: 500 mL via INTRAVENOUS

## 2023-10-06 NOTE — Progress Notes (Signed)
Progress Note   Patient: Michele House ZOX:[id number] DOB: December 23, 1955 DOA: 10/02/2023     4 DOS: the patient was seen and examined on 10/06/2023   Brief hospital course: Taken from H&P.  Michele GONALEZ is a 67 y.o. female with medical history significant of COPD, chronic respiratory failure on 2 L, hypertension, type 2 diabetes, HFpEF presenting with acute on chronic respiratory failure with hypoxia, COPD exacerbation, pneumonia, UTI, encephalopathy.  Patient reports generalized malaise over the past 3 to 4 days.  Patient noted to have been recently admitted in the hospital November 21 through November 24 for similar issues including COPD exacerbation.  Patient reports not taking her medication since discharge.  Presented to the ER afebrile, heart rate 100s, BP stable.  Satting 96% on 3 L nasal cannula.  White count 23.4, hemoglobin 15, platelets 401, lactate 1.6, procalcitonin less than 0.1, troponin 18, urinalysis indicative of infection.  Urine drug screen negative.  Chest x-ray with a right sided pneumonia.  BNP within normal limits.  Creatinine 1.03.  Glucose 368.EKG sinus tachycardia.  Admitted for concern of pneumonia/COPD exacerbation and UTI. Started on Rocephin and azithromycin.  12/1: Vital stable, respiratory viral panel negative, procalcitonin negative, preliminary blood cultures negative.  Urine cultures are ordered as add-on, ammonia levels mildly elevated at 39, strep pneumo negative, CBG elevated at 244, history of enlarging pulmonary nodule with recommendations for PET/CT during most recent admission which has not been done yet, if video bronchoscopy was scheduled for 10/13/2023 as outpatient.  Unable to take care of himself and son cannot provide the required care as she required 24-hour supervision due to worsening dementia.  PT is recommending SNF.  12/2: Hemodynamically stable, now on baseline oxygen of 2 L.  Needs SNF placement. Urine cultures pending.  12/3: Blood  pressure started trending up, restarting home Zestoretic.  Urine cultures with strep agalactiae, penicillin allergy noted, will continue with ceftriaxone for now.  Also started on Remeron for concern of worsening depression and unable to sleep at night  12/4.  Some very concerned about patient's mental status not being seen.  Patient receiving antibiotics already and should have improved by now.  Will hold Lipitor.  Restart low-dose Zoloft.  Discontinue Remeron and do Seroquel at night.     Assessment and Plan: * Acute metabolic encephalopathy Patient's mental status has not improved much as per son.  Patient very slow with her movements.  Will discontinue Lipitor.  Check vitamin B12, RPR, TSH and free T4, CBC and BMP.  Will give fluid bolus.  Will get an MRI of the brain.  Acute on chronic respiratory failure with hypoxia (HCC) Patient initially required 3 L now back to 2 L.  COPD exacerbation (HCC) Steroids discontinued.  Pneumonia Completed Zithromax and will end up completing Rocephin today.  UTI (urinary tract infection) Streptococcus agalactiae growing out of urine culture.  Would be treated with Rocephin.  (HFpEF) heart failure with preserved ejection fraction (HCC) Chronic in nature.  Not in acute exacerbation.  No signs of heart failure currently.  2D echo 09/2023 showed LVEF 55 to 60%, no regional wall motion abnormalities    Essential hypertension Patient on hydrochlorothiazide and lisinopril  Type 2 diabetes mellitus with obesity (HCC) Start Semglee insulin 10 units at night continue sliding scale insulin +4 units of NovoLog.  BMI 30.88.        Subjective: Patient seen sitting in the bed.  Offers no complaints.  Very slow with her movements.  Answer some simple  questions.  Does not elaborate much.  Physical Exam: Vitals:   10/05/23 1427 10/05/23 1633 10/05/23 2230 10/06/23 0752  BP: (!) 164/85 (!) 170/84 (!) 162/80 (!) 148/76  Pulse: (!) 101 (!) 102 96 97   Resp: 18 20 17 16   Temp: 97.7 F (36.5 C) 97.7 F (36.5 C) 97.8 F (36.6 C) 97.8 F (36.6 C)  TempSrc:  Oral  Oral  SpO2: 98% 100% 100% 93%  Weight:      Height:       Physical Exam HENT:     Head: Normocephalic.     Mouth/Throat:     Pharynx: No oropharyngeal exudate.  Eyes:     General: Lids are normal.     Conjunctiva/sclera: Conjunctivae normal.  Cardiovascular:     Rate and Rhythm: Normal rate and regular rhythm.     Heart sounds: Normal heart sounds, S1 normal and S2 normal.  Pulmonary:     Breath sounds: No decreased breath sounds, wheezing, rhonchi or rales.  Abdominal:     Palpations: Abdomen is soft.     Tenderness: There is no abdominal tenderness.  Musculoskeletal:     Right lower leg: No swelling.     Left lower leg: No swelling.  Skin:    General: Skin is warm.     Findings: No rash.  Neurological:     Mental Status: She is alert.     Comments: Answer some simple questions.  Very stiff with her movements.     Data Reviewed: Last creatinine 1.12, last white blood cell count 14.8  Family Communication: Spoke with son on the phone  Disposition: Status is: Inpatient Remains inpatient appropriate because: Patient still with altered mental status despite treatment with antibiotics.  Planned Discharge Destination: Skilled nursing facility    Time spent: 28 minutes  Author: Alford Highland, MD 10/06/2023 2:44 PM  For on call review [email or web address].

## 2023-10-06 NOTE — Progress Notes (Signed)
Physical Therapy Treatment Patient Details Name: Michele House MRN: [id number] DOB: October 17, 1956 Today's Date: 10/06/2023   History of Present Illness Pt is a 67 y/o F admitted on 10/02/23 after presenting with acute on chronic respiratory failure with hypoxia, COPD exacerbation, PNA, UTI, & encephalopathy. PMH: COPD, chronic respiratory failure on 2L,HTN, DM2, HFpEF    PT Comments  Pt was pleasant despite decreased awareness/cognition and willing to participate during the session and put forth good effort. Pt completed bed mobility with Superv but needed substantial extra time and heavy multimodal cuing and encouragement. Pt completed transfer with Mod A due to decreased ability to follow commands but presented steady upon standing. Pt amb with rollator, needing Min A for directing AD, requiring substantial cuing for sequencing/rollator management for safety. Pt's vitals were monitored during session and remained WNL on 3L O2, pt reporting moderate fatigue at end of session. Pt will benefit from continued PT services upon discharge to safely address deficits listed in patient problem list for decreased caregiver assistance and eventual return to PLOF.        If plan is discharge home, recommend the following: A little help with bathing/dressing/bathroom;A little help with walking and/or transfers;Assistance with cooking/housework;Supervision due to cognitive status;Direct supervision/assist for financial management;Help with stairs or ramp for entrance;Assist for transportation;Direct supervision/assist for medications management;Assistance with feeding   Can travel by private vehicle     Yes  Equipment Recommendations       Recommendations for Other Services       Precautions / Restrictions Precautions Precautions: Fall Restrictions Weight Bearing Restrictions: No     Mobility  Bed Mobility Overal bed mobility: Needs Assistance Bed Mobility: Supine to Sit     Supine to sit:  Supervision, HOB elevated     General bed mobility comments: very slow 2/2 decreased understanding/awareness, heavy multimodal cuing for sequencing, constantly encouraged receiving intermittent following of commands, seemingly capable strength throughout    Transfers Overall transfer level: Needs assistance Equipment used: Rollator (4 wheels) Transfers: Sit to/from Stand Sit to Stand: Mod assist           General transfer comment: slow to attempt despite heavy cuing/encouraging, eventually giving tactile assist at trunk to initiate movement, very slow in coming to full stand, exhibiting decreased awareness/confidence of performing movement    Ambulation/Gait Ambulation/Gait assistance: Min Assist for rollator management Gait Distance (Feet): 50 Feet Assistive device: Rollator (4 wheels) Gait Pattern/deviations: Decreased step length - right, Decreased stride length, Decreased step length - left, Step-to pattern Gait velocity: decreased     General Gait Details: exhibiting decreased understanding of operating rollator brakes despite repeated education/cuing for hand placement - tended to hold brakes half-squeezed throughout gait, needing repeated physical assist to redirect rollator, slow cadence, overall steady but requiring constant cuing to continue walking as pt stopped many times throughout, multimodal cuing for sequencing, hand placement, rollator management   Stairs             Wheelchair Mobility     Tilt Bed    Modified Rankin (Stroke Patients Only)       Balance Overall balance assessment: Needs assistance Sitting-balance support: Feet supported, No upper extremity supported Sitting balance-Leahy Scale: Good     Standing balance support: Bilateral upper extremity supported, During functional activity, Reliant on assistive device for balance Standing balance-Leahy Scale: Fair Standing balance comment: strong grip on rollator during both static and  dynamic activities, no LOB throughout  Cognition Arousal: Alert Behavior During Therapy: Flat affect Overall Cognitive Status: No family/caregiver present to determine baseline cognitive functioning                                 General Comments: confused, slow responses, constant reports of hearing voices/laughter - possibly referring to people in hallway, reports feeling items when not present (such as the floor with her feet still floating EOB)        Exercises      General Comments        Pertinent Vitals/Pain Pain Assessment Pain Assessment: Faces Faces Pain Scale: Hurts a little bit Pain Location: "everywhere" Pain Descriptors / Indicators: Discomfort Pain Intervention(s): Monitored during session    Home Living                          Prior Function            PT Goals (current goals can now be found in the care plan section) Acute Rehab PT Goals Patient Stated Goal: none stated PT Goal Formulation: With patient Time For Goal Achievement: 10/17/23 Potential to Achieve Goals: Good Progress towards PT goals: Progressing toward goals    Frequency    Min 1X/week      PT Plan      Co-evaluation              AM-PAC PT "6 Clicks" Mobility   Outcome Measure  Help needed turning from your back to your side while in a flat bed without using bedrails?: None Help needed moving from lying on your back to sitting on the side of a flat bed without using bedrails?: A Little Help needed moving to and from a bed to a chair (including a wheelchair)?: A Little Help needed standing up from a chair using your arms (e.g., wheelchair or bedside chair)?: A Little Help needed to walk in hospital room?: A Little Help needed climbing 3-5 steps with a railing? : A Little 6 Click Score: 19    End of Session Equipment Utilized During Treatment: Oxygen (3L) Activity Tolerance: Patient tolerated treatment  well;Other (comment) (limited by cognition) Patient left: in chair;with chair alarm set;with call bell/phone within reach;with SCD's reapplied Nurse Communication: Mobility status PT Visit Diagnosis: Unsteadiness on feet (R26.81);Other abnormalities of gait and mobility (R26.89);Muscle weakness (generalized) (M62.81);Difficulty in walking, not elsewhere classified (R26.2)     Time: 0454-0981 PT Time Calculation (min) (ACUTE ONLY): 29 min  Charges:    $Gait Training: 8-22 mins $Therapeutic Activity: 8-22 mins PT General Charges $$ ACUTE PT VISIT: 1 Visit                    Rosiland Oz SPT 10/06/23, 4:10 PM

## 2023-10-06 NOTE — [place] FL2 (Signed)
[place] MEDICAID FL2 LEVEL OF CARE FORM     IDENTIFICATION  Patient Name: Michele House Birthdate: Dec 26, 1955 Sex: female Admission Date (Current Location): 10/02/2023  [facility] and [place] Number:  Chiropodist and Address:  [facility], [address] [place], [place] [address]      Provider Number: [id number]  Attending Physician Name and Address:  Alford Highland, MD  Relative Name and Phone Number:  Rhoda Krier  [phone]    Current Level of Care: Hospital Recommended Level of Care: Skilled Nursing Facility Prior Approval Number:    Date Approved/Denied:   PASRR Number: [id number]  Discharge Plan:      Current Diagnoses: Patient Active Problem List   Diagnosis Date Noted   Altered mental status 10/02/2023   UTI (urinary tract infection) 10/02/2023   Encephalopathy 10/02/2023   (HFpEF) heart failure with preserved ejection fraction (HCC) 10/02/2023   Pneumonia 09/24/2023   COPD (chronic obstructive pulmonary disease) (HCC) 09/24/2023   Acute CHF (congestive heart failure) (HCC) 09/23/2023   COPD with acute exacerbation (HCC) 06/07/2023   Acute on chronic respiratory failure with hypoxia (HCC) 06/07/2023   Pulmonary nodule 1 cm or greater in diameter 06/07/2023   Allergic reaction 06/07/2023   Chest discomfort 10/12/2013   Tobacco use disorder 10/12/2013   Essential hypertension 10/12/2013   Obesity 10/12/2013   Type 2 diabetes mellitus with obesity (HCC) 10/12/2013    Orientation RESPIRATION BLADDER Height & Weight     Self, Time, Situation, Place  O2 (2L per [place]) Continent Weight: 74.1 kg Height:  5' 0.98" (154.9 cm)  BEHAVIORAL SYMPTOMS/MOOD NEUROLOGICAL BOWEL NUTRITION STATUS      Continent  (See Discharge Summary)  AMBULATORY STATUS COMMUNICATION OF NEEDS Skin   Extensive Assist Verbally Normal                       Personal Care Assistance Level of Assistance  Bathing, Feeding, Dressing Bathing  Assistance: Limited assistance Feeding assistance: Independent Dressing Assistance: Limited assistance     Functional Limitations Info  Sight, Hearing, Speech Sight Info: Adequate Hearing Info: Adequate Speech Info: Adequate    SPECIAL CARE FACTORS FREQUENCY  PT (By licensed PT), OT (By licensed OT)     PT Frequency: 5x weekly OT Frequency: 5x weekly            Contractures Contractures Info: Not present    Additional Factors Info  Code Status, Allergies Code Status Info: Full Code Allergies Info: Penicillins, Aspirin, Sulfa Antibiotics           Current Medications (10/06/2023):  This is the current hospital active medication list Current Facility-Administered Medications  Medication Dose Route Frequency Provider Last Rate Last Admin   acetaminophen (TYLENOL) tablet 650 mg  650 mg Oral Q6H PRN Floydene Flock, MD       aspirin EC tablet 81 mg  81 mg Oral Daily Floydene Flock, MD   81 mg at 10/06/23 0856   atorvastatin (LIPITOR) tablet 20 mg  20 mg Oral Daily Floydene Flock, MD   20 mg at 10/06/23 0856   cefTRIAXone (ROCEPHIN) 2 g in sodium chloride 0.9 % 100 mL IVPB  2 g Intravenous Q24H Floydene Flock, MD 200 mL/hr at 10/06/23 0635 2 g at 10/06/23 0635   dextromethorphan-guaiFENesin (MUCINEX DM) 30-600 MG per 12 hr tablet 1 tablet  1 tablet Oral BID PRN Floydene Flock, MD       enoxaparin (LOVENOX)  injection 40 mg  40 mg Subcutaneous Q24H Floydene Flock, MD   40 mg at 10/06/23 0856   hydrochlorothiazide (HYDRODIURIL) tablet 25 mg  25 mg Oral Daily Arnetha Courser, MD   25 mg at 10/06/23 0856   insulin aspart (novoLOG) injection 0-15 Units  0-15 Units Subcutaneous TID WC Floydene Flock, MD   3 Units at 10/06/23 0858   insulin aspart (novoLOG) injection 0-5 Units  0-5 Units Subcutaneous QHS Floydene Flock, MD       insulin aspart (novoLOG) injection 4 Units  4 Units Subcutaneous TID WC Floydene Flock, MD   4 Units at 10/06/23 0858   ipratropium-albuterol  (DUONEB) 0.5-2.5 (3) MG/3ML nebulizer solution 3 mL  3 mL Nebulization Q4H PRN Floydene Flock, MD       levothyroxine (SYNTHROID) tablet 100 mcg  100 mcg Oral Q0600 Floydene Flock, MD   100 mcg at 10/06/23 0621   lisinopril (ZESTRIL) tablet 20 mg  20 mg Oral Daily Arnetha Courser, MD   20 mg at 10/06/23 0856   loratadine (CLARITIN) tablet 10 mg  10 mg Oral Daily Floydene Flock, MD   10 mg at 10/06/23 0856   mirtazapine (REMERON SOL-TAB) disintegrating tablet 15 mg  15 mg Oral QHS Arnetha Courser, MD   15 mg at 10/05/23 2315   ondansetron (ZOFRAN) tablet 4 mg  4 mg Oral Q6H PRN Floydene Flock, MD       Or   ondansetron [facility]) injection 4 mg  4 mg Intravenous Q6H PRN Floydene Flock, MD         Discharge Medications: Please see discharge summary for a list of discharge medications.  Relevant Imaging Results:  Relevant Lab Results:   Additional Information [id number]  Garret Reddish, RN

## 2023-10-06 NOTE — Plan of Care (Signed)
  Problem: Education: Goal: Ability to describe self-care measures that may prevent or decrease complications (Diabetes Survival Skills Education) will improve Outcome: Progressing Goal: Individualized Educational Video(s) Outcome: Progressing   Problem: Coping: Goal: Ability to adjust to condition or change in health will improve Outcome: Progressing   Problem: Fluid Volume: Goal: Ability to maintain a balanced intake and output will improve Outcome: Progressing   Problem: Health Behavior/Discharge Planning: Goal: Ability to identify and utilize available resources and services will improve Outcome: Progressing Goal: Ability to manage health-related needs will improve Outcome: Progressing   Problem: Metabolic: Goal: Ability to maintain appropriate glucose levels will improve Outcome: Progressing   Problem: Nutritional: Goal: Maintenance of adequate nutrition will improve Outcome: Progressing Goal: Progress toward achieving an optimal weight will improve Outcome: Progressing   Problem: Skin Integrity: Goal: Risk for impaired skin integrity will decrease Outcome: Progressing   Problem: Tissue Perfusion: Goal: Adequacy of tissue perfusion will improve Outcome: Progressing   Problem: Health Behavior/Discharge Planning: Goal: Ability to manage health-related needs will improve Outcome: Progressing   Problem: Clinical Measurements: Goal: Ability to maintain clinical measurements within normal limits will improve Outcome: Progressing Goal: Will remain free from infection Outcome: Progressing Goal: Diagnostic test results will improve Outcome: Progressing Goal: Respiratory complications will improve Outcome: Progressing Goal: Cardiovascular complication will be avoided Outcome: Progressing

## 2023-10-06 NOTE — Assessment & Plan Note (Signed)
Chronic in nature.  Not in acute exacerbation.  No signs of heart failure currently.  2D echo 09/2023 showed LVEF 55 to 60%, no regional wall motion abnormalities

## 2023-10-06 NOTE — Assessment & Plan Note (Signed)
Resolved

## 2023-10-06 NOTE — Assessment & Plan Note (Addendum)
Restart lisinopril with blood pressure being higher now.

## 2023-10-06 NOTE — Assessment & Plan Note (Addendum)
Sugars high now that she is on high-dose Solu-Medrol.  BMI 30.88

## 2023-10-06 NOTE — Plan of Care (Signed)
  Problem: Education: Goal: Ability to describe self-care measures that may prevent or decrease complications (Diabetes Survival Skills Education) will improve Outcome: Progressing Goal: Individualized Educational Video(s) Outcome: Progressing   Problem: Coping: Goal: Ability to adjust to condition or change in health will improve Outcome: Progressing   Problem: Fluid Volume: Goal: Ability to maintain a balanced intake and output will improve Outcome: Progressing   Problem: Health Behavior/Discharge Planning: Goal: Ability to identify and utilize available resources and services will improve Outcome: Progressing Goal: Ability to manage health-related needs will improve Outcome: Progressing   Problem: Metabolic: Goal: Ability to maintain appropriate glucose levels will improve Outcome: Progressing   Problem: Nutritional: Goal: Maintenance of adequate nutrition will improve Outcome: Progressing Goal: Progress toward achieving an optimal weight will improve Outcome: Progressing   Problem: Skin Integrity: Goal: Risk for impaired skin integrity will decrease Outcome: Progressing   Problem: Tissue Perfusion: Goal: Adequacy of tissue perfusion will improve Outcome: Progressing   Problem: Education: Goal: Knowledge of General Education information will improve Description: Including pain rating scale, medication(s)/side effects and non-pharmacologic comfort measures Outcome: Progressing   Problem: Health Behavior/Discharge Planning: Goal: Ability to manage health-related needs will improve Outcome: Progressing   Problem: Clinical Measurements: Goal: Ability to maintain clinical measurements within normal limits will improve Outcome: Progressing Goal: Will remain free from infection Outcome: Progressing Goal: Diagnostic test results will improve Outcome: Progressing Goal: Respiratory complications will improve Outcome: Progressing Goal: Cardiovascular complication will  be avoided Outcome: Progressing   Problem: Activity: Goal: Risk for activity intolerance will decrease Outcome: Progressing   Problem: Nutrition: Goal: Adequate nutrition will be maintained Outcome: Progressing   Problem: Coping: Goal: Level of anxiety will decrease Outcome: Progressing   Problem: Elimination: Goal: Will not experience complications related to bowel motility Outcome: Progressing Goal: Will not experience complications related to urinary retention Outcome: Progressing   Problem: Pain Management: Goal: General experience of comfort will improve Outcome: Progressing   Problem: Safety: Goal: Ability to remain free from injury will improve Outcome: Progressing   Problem: Skin Integrity: Goal: Risk for impaired skin integrity will decrease Outcome: Progressing   Problem: Education: Goal: Knowledge of disease or condition will improve Outcome: Progressing Goal: Knowledge of the prescribed therapeutic regimen will improve Outcome: Progressing Goal: Individualized Educational Video(s) Outcome: Progressing   Problem: Activity: Goal: Ability to tolerate increased activity will improve Outcome: Progressing Goal: Will verbalize the importance of balancing activity with adequate rest periods Outcome: Progressing   Problem: Respiratory: Goal: Ability to maintain a clear airway will improve Outcome: Progressing Goal: Levels of oxygenation will improve Outcome: Progressing Goal: Ability to maintain adequate ventilation will improve Outcome: Progressing   Problem: Activity: Goal: Ability to tolerate increased activity will improve Outcome: Progressing   Problem: Clinical Measurements: Goal: Ability to maintain a body temperature in the normal range will improve Outcome: Progressing   Problem: Respiratory: Goal: Ability to maintain adequate ventilation will improve Outcome: Progressing Goal: Ability to maintain a clear airway will improve Outcome:  Progressing

## 2023-10-06 NOTE — Assessment & Plan Note (Addendum)
Waxing and waning mental status, having some hallucination.   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   Discontinued Lipitor.  Patient has elevated vitamin B12, nonreactive RPR, TSH in the normal range.  Potentially has a progressive dementia versus limbic encephalitis versus autoimmune encephalitis versus leptomeningeal spread from spiculated nodule.  Lumbar puncture done 12/6 with testing sent to [facility].  Patient completed 5 days of high-dose Solu-Medrol (today will be day 5).  Repeat ABG does not show CO2 retention.  Repeat EEG does not show any seizure activity.    No definitive diagnosis yet.  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

## 2023-10-07 DIAGNOSIS — J441 Chronic obstructive pulmonary disease with (acute) exacerbation: Secondary | ICD-10-CM | POA: Diagnosis not present

## 2023-10-07 DIAGNOSIS — J9621 Acute and chronic respiratory failure with hypoxia: Secondary | ICD-10-CM | POA: Diagnosis not present

## 2023-10-07 DIAGNOSIS — R911 Solitary pulmonary nodule: Secondary | ICD-10-CM

## 2023-10-07 DIAGNOSIS — G9341 Metabolic encephalopathy: Secondary | ICD-10-CM | POA: Diagnosis not present

## 2023-10-07 DIAGNOSIS — I5031 Acute diastolic (congestive) heart failure: Secondary | ICD-10-CM

## 2023-10-07 DIAGNOSIS — F0390 Unspecified dementia without behavioral disturbance: Secondary | ICD-10-CM

## 2023-10-07 DIAGNOSIS — G0481 Other encephalitis and encephalomyelitis: Secondary | ICD-10-CM

## 2023-10-07 LAB — GLUCOSE, CAPILLARY
Glucose-Capillary: 162 mg/dL — ABNORMAL HIGH (ref 70–99)
Glucose-Capillary: 260 mg/dL — ABNORMAL HIGH (ref 70–99)
Glucose-Capillary: 45 mg/dL — ABNORMAL LOW (ref 70–99)
Glucose-Capillary: 58 mg/dL — ABNORMAL LOW (ref 70–99)
Glucose-Capillary: 80 mg/dL (ref 70–99)
Glucose-Capillary: 262 mg/dL — ABNORMAL HIGH (ref 70–99)

## 2023-10-07 LAB — RPR: RPR Ser Ql: NONREACTIVE

## 2023-10-07 LAB — VITAMIN B12: Vitamin B-12: 4067 pg/mL — ABNORMAL HIGH (ref 180–914)

## 2023-10-07 MED ORDER — QUETIAPINE FUMARATE 25 MG PO TABS
12.5000 mg | ORAL_TABLET | Freq: Every day | ORAL | Status: DC
  Administered 2023-10-07: 12.5 mg via ORAL
  Filled 2023-10-07: qty 1

## 2023-10-07 MED ORDER — QUETIAPINE FUMARATE 25 MG PO TABS: 12.5000 mg | ORAL_TABLET | Freq: Every evening | ORAL | Status: DC | PRN

## 2023-10-07 MED ORDER — INSULIN GLARGINE-YFGN 100 UNIT/ML ~~LOC~~ SOLN
6.0000 [IU] | Freq: Every day | SUBCUTANEOUS | Status: DC
  Filled 2023-10-07: qty 0.06

## 2023-10-07 MED ORDER — DEXTROSE 50 % IV SOLN
25.0000 g | INTRAVENOUS | Status: AC
  Administered 2023-10-07: 25 g via INTRAVENOUS
  Filled 2023-10-07: qty 50

## 2023-10-07 MED ORDER — DEXTROSE-SODIUM CHLORIDE 5-0.9 % IV SOLN: INTRAVENOUS | Status: DC

## 2023-10-07 MED ORDER — ORAL CARE MOUTH RINSE: 15.0000 mL | OROMUCOSAL | Status: DC | PRN

## 2023-10-07 NOTE — Progress Notes (Signed)
Progress Note   Patient: Michele House:[id number] DOB: Feb 12, 1956 DOA: 10/02/2023     5 DOS: the patient was seen and examined on 10/07/2023   Brief hospital course: Taken from H&P.  Michele House is a 67 y.o. female with medical history significant of COPD, chronic respiratory failure on 2 L, hypertension, type 2 diabetes, HFpEF presenting with acute on chronic respiratory failure with hypoxia, COPD exacerbation, pneumonia, UTI, encephalopathy.  Patient reports generalized malaise over the past 3 to 4 days.  Patient noted to have been recently admitted in the hospital November 21 through November 24 for similar issues including COPD exacerbation.  Patient reports not taking her medication since discharge.  Presented to the ER afebrile, heart rate 100s, BP stable.  Satting 96% on 3 L nasal cannula.  White count 23.4, hemoglobin 15, platelets 401, lactate 1.6, procalcitonin less than 0.1, troponin 18, urinalysis indicative of infection.  Urine drug screen negative.  Chest x-ray with a right sided pneumonia.  BNP within normal limits.  Creatinine 1.03.  Glucose 368.EKG sinus tachycardia.  Admitted for concern of pneumonia/COPD exacerbation and UTI. Started on Rocephin and azithromycin.  12/1: Vital stable, respiratory viral panel negative, procalcitonin negative, preliminary blood cultures negative.  Urine cultures are ordered as add-on, ammonia levels mildly elevated at 39, strep pneumo negative, CBG elevated at 244, history of enlarging pulmonary nodule with recommendations for PET/CT during most recent admission which has not been done yet, if video bronchoscopy was scheduled for 10/13/2023 as outpatient.  Unable to take care of himself and son cannot provide the required care as she required 24-hour supervision due to worsening dementia.  PT is recommending SNF.  12/2: Hemodynamically stable, now on baseline oxygen of 2 L.  Needs SNF placement. Urine cultures pending.  12/3: Blood  pressure started trending up, restarting home Zestoretic.  Urine cultures with strep agalactiae, penicillin allergy noted, will continue with ceftriaxone for now.  Also started on Remeron for concern of worsening depression and unable to sleep at night  12/4.  Some very concerned about patient's mental status not being seen.  Patient receiving antibiotics already and should have improved by now.  Will hold Lipitor.  Restart low-dose Zoloft.  Discontinue Remeron and do Seroquel at night. 12/5.  Patient has some blurry vision and weakness and pain.  MRI of the brain negative for acute intracranial abnormality.     Assessment and Plan: * Acute metabolic encephalopathy Patient's mental status has not improved.  MRI of the brain negative for acute intracranial abnormality.  Likely microvascular ischemic changes.  Discontinued Lipitor.  Patient has elevated vitamin B12, nonreactive RPR, TSH in the normal range.  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  Neurology will set up for a lumbar puncture tomorrow and likely high-dose steroids for 5 days.  Acute on chronic respiratory failure with hypoxia (HCC) Patient initially required 3 L now back to 2 L.  COPD exacerbation (HCC) Resolved  Pneumonia Completed 5 days of Rocephin and Zithromax  Lung nodule 1.3 cm right upper lobe spiculated in nature.  Could be a primary lung cancerous process.  UTI (urinary tract infection) Streptococcus agalactiae growing out of urine culture.  Completed Rocephin  (HFpEF) heart failure with preserved ejection fraction (HCC) Chronic in nature.  Not in acute exacerbation.  No signs of heart failure currently.  2D echo 09/2023 showed LVEF 55 to 60%, no regional wall  motion abnormalities    Essential hypertension Blood pressure a little bit on the lower side held  antihypertensive medications.  Type 2 diabetes mellitus with obesity (HCC) Start Semglee insulin 10 units at night continue sliding scale insulin +4 units of NovoLog.  BMI 30.88.        Subjective: Patient had blurry vision bilaterally and weakness bilaterally and pain.  Admitted with chest pain and altered mental status.  Physical Exam: Vitals:   10/06/23 0752 10/06/23 1629 10/07/23 0414 10/07/23 0757  BP: (!) 148/76 (!) 153/96 (!) 85/61 (!) 100/50  Pulse: 97 (!) 110 83 80  Resp: 16 16 18 16   Temp: 97.8 F (36.6 C) 97.9 F (36.6 C) (!) 97.3 F (36.3 C) 98.9 F (37.2 C)  TempSrc: Oral Oral  Oral  SpO2: 93% 98% 93% 99%  Weight:      Height:       Physical Exam HENT:     Head: Normocephalic.     Mouth/Throat:     Pharynx: No oropharyngeal exudate.  Eyes:     General: Lids are normal.     Conjunctiva/sclera: Conjunctivae normal.  Cardiovascular:     Rate and Rhythm: Normal rate and regular rhythm.     Heart sounds: Normal heart sounds, S1 normal and S2 normal.  Pulmonary:     Breath sounds: No decreased breath sounds, wheezing, rhonchi or rales.  Abdominal:     Palpations: Abdomen is soft.     Tenderness: There is no abdominal tenderness.  Musculoskeletal:     Right lower leg: No swelling.     Left lower leg: No swelling.  Skin:    General: Skin is warm.     Findings: No rash.  Neurological:     Mental Status: She is lethargic.     Comments: Answer some simple questions.  Did not straight leg raise or lift her arms up off the bed today.  Blurry vision and can only see fingers in front of her eyes.     Data Reviewed: MRI of the brain showed no acute intracranial abnormality likely bihemispheric microvascular ischemia Reviewed CT scan from 09/2223 showing a spiculated nodule 1.6 cm White blood cell count 16.6, hemoglobin 15.3, TSH 3.002, vitamin B12 4067, CK 63, creatinine 0.98  Family Communication: Left message for son but neurology spoke with family  today  Disposition: Status is: Inpatient Remains inpatient appropriate because: Neurology to order lumbar puncture for tomorrow.  Then will need 5 days of high-dose IV steroids.  Planned Discharge Destination: To be determined based on clinical course    Time spent: 28 minutes  Author: Alford Highland, MD 10/07/2023 2:35 PM  For on call review [email or web address].

## 2023-10-07 NOTE — Consult Note (Signed)
NEUROLOGY CONSULTATION NOTE   Date of service: October 07, 2023 Patient Name: Michele House MRN:  [id number] DOB:  Jul 24, 1956 Reason for consult: altered mental status Requesting physician: Dr. Alford Highland _ _ _   _ __   _ __ _ _  __ __   _ __   __ _  History of Present Illness   This is a 67 year old woman with past medical history significant for COPD, chronic respiratory failure on 2 L of home O2, hypertension, type 2 diabetes, heart failure with preserved ejection fraction who presented with acute on chronic respiratory failure with hypoxia, COPD exacerbation, pneumonia, and UTI.  Prior to admission patient had general malaise for 3 to 4 days.  She was recently admitted to the hospital from November 22 through 24 for similar issues including COPD exacerbation.  She has not been compliant with her medications since discharge.  She lives with her son who describes a rapid decline.  Specifically she was at her baseline mental status approximately 6 months ago.  She became increasingly forgetful which he attributed to old age.  She has become increasingly impaired in her ADLs and she now requires 13-KGMW supervision.  Son cannot recall a specific trigger for the decline but now he feels that he is no longer able to take care of her at home.  On my examination today she is fully oriented the actively hallucinating that her son is in the room when there is nobody in the room with this.  She is being continued on ceftriaxone for urinary tract infection.  She was initially started on Remeron due to concern of worsening depression but this was switched over to Seroquel for hallucinations and insomnia.  I spoke with daughter Michele House who reiterated the same concerns that her mother was not normal from a cognitive standpoint although she had been weaker than normal approximately 6 months ago and over that period of time has progressed to the point where she is not able to take care of herself and has to be  supervised 24 hours/day.  RPR was negative, B12 4067, TSH within normal limits, ammonia mildly elevated at 39.  MRI brain without contrast performed overnight showed no acute abnormality.  She does have confluent hyperintense T2 weighted signal within the bihemispheric white matter felt to be secondary to chronic microvascular ischemia.  CNS imaging personally reviewed.   ROS   UTA 2/2 AMS  Past History   I have reviewed the following:  Past Medical History:  Diagnosis Date   Acid reflux    COPD (chronic obstructive pulmonary disease) (HCC)    Diabetes mellitus without complication (HCC)    Hyperlipidemia    Hypertension    Thyroid disease    Past Surgical History:  Procedure Laterality Date   appendectomy     APPENDECTOMY     BREAST BIOPSY Right    CORE W/CLIP - NEG   TOTAL VAGINAL HYSTERECTOMY     TUMOR REMOVAL     benign;stomach   Family History  Problem Relation Age of Onset   Heart attack Mother    Hypertension Mother    Breast cancer Sister 47   Breast cancer Maternal Aunt        40'S   Breast cancer Maternal Grandmother    Social History   Socioeconomic History   Marital status: Single    Spouse name: Not on file   Number of children: Not on file   Years of education: Not on file  Highest education level: Not on file  Occupational History   Not on file  Tobacco Use   Smoking status: Former    Current packs/day: 0.00    Types: Cigarettes    Quit date: 12/15/1985    Years since quitting: 37.8   Smokeless tobacco: Never  Substance and Sexual Activity   Alcohol use: No   Drug use: No   Sexual activity: Not Currently  Other Topics Concern   Not on file  Social History Narrative   Not on file   Social Determinants of Health   Financial Resource Strain: Not on file  Food Insecurity: Patient Unable To Answer (10/03/2023)   Hunger Vital Sign    Worried About Running Out of Food in the Last Year: Patient unable to answer    Ran Out of Food in the Last  Year: Patient unable to answer  Recent Concern: Food Insecurity - Food Insecurity Present (09/24/2023)   Hunger Vital Sign    Worried About Running Out of Food in the Last Year: Sometimes true    Ran Out of Food in the Last Year: Sometimes true  Transportation Needs: Patient Unable To Answer (10/03/2023)   PRAPARE - Transportation    Lack of Transportation (Medical): Patient unable to answer    Lack of Transportation (Non-Medical): Patient unable to answer  Recent Concern: Transportation Needs - Unmet Transportation Needs (09/24/2023)   PRAPARE - Administrator, Civil Service (Medical): Yes    Lack of Transportation (Non-Medical): Yes  Physical Activity: Not on file  Stress: Not on file  Social Connections: Not on file   Allergies  Allergen Reactions   Penicillins Hives   Aspirin Other (See Comments)    Lips numb   Sulfa Antibiotics     Medications   Medications Prior to Admission  Medication Sig Dispense Refill Last Dose   aspirin EC 81 MG tablet Take 81 mg by mouth daily.   Unknown at Unknown   atorvastatin (LIPITOR) 20 MG tablet Take 20 mg by mouth daily.   Unknown at Unknown   cetirizine (ZYRTEC) 10 MG tablet Take 10 mg by mouth daily.   Unknown at Unknown   COMBIVENT RESPIMAT 20-100 MCG/ACT AERS respimat Inhale into the lungs every 6 (six) hours as needed for wheezing or shortness of breath.   prn at prn   dextromethorphan-guaiFENesin (MUCINEX DM) 30-600 MG 12hr tablet Take 1 tablet by mouth 2 (two) times daily as needed for cough. 30 tablet 0 prn at prn   diclofenac sodium (VOLTAREN) 1 % GEL Apply topically 4 (four) times daily.   Unknown at Unknown   DULERA 200-5 MCG/ACT AERO Inhale 2 puffs into the lungs every 12 (twelve) hours.   Unknown at Unknown   furosemide (LASIX) 20 MG tablet Take 1 tablet (20 mg total) by mouth daily. 30 tablet 0 Unknown at Unknown   glipiZIDE (GLUCOTROL) 10 MG tablet Take 10 mg by mouth daily before breakfast.   Unknown at Unknown    ipratropium (ATROVENT HFA) 17 MCG/ACT inhaler Inhale 2 puffs into the lungs every 6 (six) hours.   Unknown at Unknown   levothyroxine (SYNTHROID, LEVOTHROID) 100 MCG tablet Take 100 mcg by mouth daily before breakfast.   Unknown at Unknown   lisinopril-hydrochlorothiazide (PRINZIDE,ZESTORETIC) 20-25 MG per tablet Take 1 tablet by mouth daily.   Unknown at Unknown   Multiple Vitamin (MULTIVITAMIN) capsule Take 1 capsule by mouth daily.   Unknown at Unknown   pregabalin (LYRICA) 50 MG capsule  Take 50 mg by mouth 2 (two) times daily.   Unknown at Unknown   sertraline (ZOLOFT) 100 MG tablet Take 100 mg by mouth daily.   Unknown at Unknown   TRADJENTA 5 MG TABS tablet Take 5 mg by mouth daily.   Unknown at Unknown   predniSONE (STERAPRED UNI-PAK 21 TAB) 10 MG (21) TBPK tablet As directed on packaging 1 each 0       Current Facility-Administered Medications:    acetaminophen (TYLENOL) tablet 650 mg, 650 mg, Oral, Q6H PRN, Floydene Flock, MD, 650 mg at 10/07/23 0848   dextromethorphan-guaiFENesin (MUCINEX DM) 30-600 MG per 12 hr tablet 1 tablet, 1 tablet, Oral, BID PRN, Floydene Flock, MD   dextrose 5 %-0.9 % sodium chloride infusion, , Intravenous, Continuous, Renae Gloss, Richard, MD, Last Rate: 40 mL/hr at 10/07/23 1846, New Bag at 10/07/23 1846   ipratropium-albuterol (DUONEB) 0.5-2.5 (3) MG/3ML nebulizer solution 3 mL, 3 mL, Nebulization, Q4H PRN, Floydene Flock, MD   levothyroxine (SYNTHROID) tablet 100 mcg, 100 mcg, Oral, Q0600, Floydene Flock, MD, 100 mcg at 10/07/23 0609   loratadine (CLARITIN) tablet 10 mg, 10 mg, Oral, Daily, Floydene Flock, MD, 10 mg at 10/07/23 0831   ondansetron (ZOFRAN) tablet 4 mg, 4 mg, Oral, Q6H PRN **OR** ondansetron (ZOFRAN) injection 4 mg, 4 mg, Intravenous, Q6H PRN, Floydene Flock, MD   Oral care mouth rinse, 15 mL, Mouth Rinse, PRN, Renae Gloss, Richard, MD   QUEtiapine (SEROQUEL) tablet 12.5 mg, 12.5 mg, Oral, QHS, [place], Richard, MD, 12.5 mg at 10/07/23  2024   sertraline (ZOLOFT) tablet 25 mg, 25 mg, Oral, Daily, Alford Highland, MD, 25 mg at 10/07/23 0831  Vitals   Vitals:   10/06/23 0752 10/06/23 1629 10/07/23 0414 10/07/23 0757  BP: (!) 148/76 (!) 153/96 (!) 85/61 (!) 100/50  Pulse: 97 (!) 110 83 80  Resp: 16 16 18 16   Temp: 97.8 F (36.6 C) 97.9 F (36.6 C) (!) 97.3 F (36.3 C) 98.9 F (37.2 C)  TempSrc: Oral Oral  Oral  SpO2: 93% 98% 93% 99%  Weight:      Height:         Body mass index is 30.88 kg/m.  Physical Exam    Gen: patient lying in bed, NAD CV: extremities appear well-perfused Resp: normal WOB  Neurologic exam MS: alert, able to answer orientation questions (reports place as hospital, year 2024), however is unable to provide a cohesive history.  Attention impaired, registration impaired, delayed recall 0 out of 3.  Unable to calculate change.  Actively responding to internal stimuli in the room and having a conversation with her son Michele Hua who was not present during the exam. Speech: mild dysarthria, no aphasia CN: PERRL, VFF, EOMI, sensation intact, face symmetric, hearing intact to voice Motor: 4/5 strength diffusely formal strength exam 2/2 effort and AMS, bradykinetic but not rigid Sensory: SILT Reflexes: 2+ brisk symm with toes equiv bilat Coordination: FNF UTA 2/2 confusion Gait: deferred    Labs   CBC:  Recent Labs  Lab 10/02/23 0351 10/03/23 0440 10/06/23 1433  WBC 23.4* 14.8* 16.6*  NEUTROABS 19.7*  --   --   HGB 15.1* 13.9 15.3*  HCT 46.3* 42.8 47.3*  MCV 88.7 88.8 90.1  PLT 401* 302 357    Basic Metabolic Panel:  Lab Results  Component Value Date   NA 142 10/06/2023   K 3.8 10/06/2023   CO2 29 10/06/2023   GLUCOSE 187 (H) 10/06/2023  BUN 33 (H) 10/06/2023   CREATININE 0.98 10/06/2023   CALCIUM 10.0 10/06/2023   GFRNONAA >60 10/06/2023   GFRAA >60 08/19/2017   Lipid Panel: No results found for: "LDLCALC" HgbA1c:  Lab Results  Component Value Date   HGBA1C 7.1 (H)  06/07/2023   Urine Drug Screen:     Component Value Date/Time   LABOPIA NONE DETECTED 10/02/2023 0458   COCAINSCRNUR NONE DETECTED 10/02/2023 0458   LABBENZ NONE DETECTED 10/02/2023 0458   AMPHETMU NONE DETECTED 10/02/2023 0458   THCU NONE DETECTED 10/02/2023 0458   LABBARB NONE DETECTED 10/02/2023 0458    Alcohol Level No results found for: "ETH"   MRI Brain 1. No acute intracranial abnormality. 2. Confluent hyperintense T2-weighted signal within the superior bihemispheric white matter, most commonly indicating chronic microvascular ischemia.  CNS imaging personally reviewed, I agree with above interpretation  CTA chest 09/24/23 No evidence of pulmonary embolus.   Patchy bilateral ground-glass and nodular opacities, right greater than left. Appearance is concerning for pneumonia.   Enlarging spiculated nodule in the inferior right upper lobe now 1.6 cm compared to 1.3 cm previously. Recommend further evaluation with PET CT.  Impression   This is a 53 year old woman with past medical history significant for COPD, chronic respiratory failure on 2 L of home O2, hypertension, type 2 diabetes, heart failure with preserved ejection fraction who presented with acute on chronic respiratory failure with hypoxia, COPD exacerbation, pneumonia, and UTI.  Neurology was consulted for altered mental status.  She does seem to have some acute delirium associated with the hospital stay and is hallucinating during our examination which is a new symptom for her.  However I spoke to her son and daughter both at length today who report a rapidly progressive decline from her baseline cognitive status approximately 6 months ago.  She is now unable to care for herself and PT is recommending SNF.  Workup for reversible causes of dementia has thus far been unremarkable.  She was found to have a spiculated nodule in the inferior right upper lobe which has now increased in size as of most recent CTA chest on  November 22.  Further evaluation has been recommended with the PET/CT.  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  Recommend IR guided LP tomorrow for autoimmune encephalopathy send off to [facility].  After the LP is completed would recommend a course of high-dose steroids for empiric treatment of autoimmune encephalitis.  Recommendations   - IR-guided LP tomorrow for cell count x2 tubes, glucose, protein, gram stain, culture, cytology, ENC2 autoimmune encephalopathy panel [facility] - Serum autoimmune encephalopathy panel ENS2 [facility] - Start 5 day course high-dose steroids tomorrow 1g q 24 hrs. We may need to increase her seroquel temporarily while she is on high-dose steroids since she is already having hallucinations (although for now is redirectable and calm) - Neurology will continue to follow ______________________________________________________________________   Thank you for the opportunity to take part in the care of this patient. If you have any further questions, please contact the neurology consultation attending.  Signed,  Bing Neighbors, MD [facility] [phone]  If 7pm- 7am, please page neurology on call as listed in AMION.  **Any copied and pasted documentation in this note was written by me in another application not billed for and pasted by me into this document.

## 2023-10-07 NOTE — TOC Progression Note (Signed)
Transition of Care [facility]) - Progression Note    Patient Details  Name: Michele House MRN: [id number] Date of Birth: 29-Sep-1956  Transition of Care [facility]) CM/SW Contact  Garret Reddish, RN Phone Number: 10/07/2023, 9:37 AM  Clinical Narrative:    Chart reviewed.  LVM for patient's son to discuss bed offers.    TOC will continue to follow for discharge planning.          Expected Discharge Plan and Services                                               Social Determinants of Health (SDOH) Interventions SDOH Screenings   Food Insecurity: Patient Unable To Answer (10/03/2023)  Recent Concern: Food Insecurity - Food Insecurity Present (09/24/2023)  Housing: High Risk (10/03/2023)  Transportation Needs: Patient Unable To Answer (10/03/2023)  Recent Concern: Transportation Needs - Unmet Transportation Needs (09/24/2023)  Utilities: Patient Unable To Answer (10/03/2023)  Tobacco Use: Medium Risk (10/02/2023)    Readmission Risk Interventions     No data to display

## 2023-10-07 NOTE — Progress Notes (Signed)
Occupational Therapy Treatment Patient Details Name: Michele House MRN: [id number] DOB: [birth year] Today's Date: 10/07/2023   History of present illness Pt is a 67 y/o F admitted on 10/02/23 after presenting with acute on chronic respiratory failure with hypoxia, COPD exacerbation, PNA, UTI, & encephalopathy. PMH: COPD, chronic respiratory failure on 2L,HTN, DM2, HFpEF   OT comments  Michele House was seen for OT treatment on this date. Upon arrival to room pt supine in bed with BLE through bed railing, laying near horizontal in bed. Pt cognition impaired as compared to previous therapy notes. She requires max multimodal cueing to attend to this author t/o session. RN notified/aware. OT facilitated ADL management as described below. See ADL section for additional details regarding occupational performance. Pt continues to be functionally limited by impaired cognition, decreased safety awareness, and impaired balance. Limited progress toward OT goals in setting of decreased cognition/lethargy and continues to benefit from skilled OT services to maximize return to PLOF and minimize risk of future falls, injury, caregiver burden, and readmission. Will continue to follow POC as written. Discharge recommendation remains appropriate.        If plan is discharge home, recommend the following:  Assistance with cooking/housework;Assist for transportation;Help with stairs or ramp for entrance;Direct supervision/assist for medications management;Supervision due to cognitive status;Direct supervision/assist for financial management;Two people to help with walking and/or transfers;Two people to help with bathing/dressing/bathroom   Equipment Recommendations       Recommendations for Other Services      Precautions / Restrictions Precautions Precautions: Fall Restrictions Weight Bearing Restrictions: No       Mobility Bed Mobility Overal bed mobility: Needs Assistance Bed Mobility: Supine to Sit      Supine to sit: Max assist, HOB elevated Sit to supine: Max assist, +2 for physical assistance   General bed mobility comments: MAX A for bed mobility. +2 assist to boost upright in bed. Pt makes next to no physical attempts to assist in mobility this date.    Transfers                   General transfer comment: NT, pt unsafe/unable.     Balance Overall balance assessment: Needs assistance Sitting-balance support: Feet supported, Bilateral upper extremity supported Sitting balance-Leahy Scale: Zero Sitting balance - Comments: Unable to maintain static sitting at EOB without MAX A                                   ADL either performed or assessed with clinical judgement   ADL Overall ADL's : Needs assistance/impaired     Grooming: Sitting;Maximal assistance Grooming Details (indicate cue type and reason): Pt is unable to sit unsupported at EOB. She requires MAX A to maintain static sitting balance as well as MAX A to bring cloth up to face. She is unable to consistently follow VCs during session and requires multimodal cueing to attend to task/topic at hand. At times she answers questions clearly, other times she is observed to simply repeat OT's word or phrase. RN updated on cognition and aware. Pt undergoing further workup. Cognition does not improve as session progresses.                               General ADL Comments: MAX A for bed mobility. Low muscle tone appreciated and limited AROM of BUE/BLE. Pt found with  BLE sticking through bed railing laying nearly sideways in bed. Assisted with re-positioning and attempted seated ADL at EOB, however pt cognition does not improve during session. She requires MAX-TOTAL A for all mobility. Standing activity not attempted as pt unsafe/unable to attempt.    Extremity/Trunk Assessment              Vision       Perception     Praxis      Cognition Arousal: Lethargic Behavior During  Therapy: Flat affect Overall Cognitive Status: No family/caregiver present to determine baseline cognitive functioning                                 General Comments: Pt confused/lethargic t/o session. Slow to respond to questions and often noted to repeat questions/words rather than answer. Able to state she is in the hospital but otherwise cannot answer other A&O questions. Requires max multimodal cueing to attend to functional activity during session.        Exercises Other Exercises Other Exercises: OT facilitated ADL management with education and assist as described above.    Shoulder Instructions       General Comments PT recieved with [place] pushed up over nose. O2 sats noted to be 86% at on RA. [place] replaced and pt rebounds to >/=90% on 3L.    Pertinent Vitals/ Pain       Pain Assessment Pain Assessment: Faces Faces Pain Scale: Hurts little more Pain Location: "everywhere" Pain Descriptors / Indicators: Discomfort Pain Intervention(s): Limited activity within patient's tolerance, Monitored during session, Repositioned  Home Living                                          Prior Functioning/Environment              Frequency  Min 1X/week        Progress Toward Goals  OT Goals(current goals can now be found in the care plan section)  Progress towards OT goals: Progressing toward goals  Acute Rehab OT Goals Patient Stated Goal: go home OT Goal Formulation: With patient Time For Goal Achievement: 10/17/23 Potential to Achieve Goals: Good  Plan      Co-evaluation                 AM-PAC OT "6 Clicks" Daily Activity     Outcome Measure   Help from another person eating meals?: A Little Help from another person taking care of personal grooming?: A Lot Help from another person toileting, which includes using toliet, bedpan, or urinal?: A Lot Help from another person bathing (including washing, rinsing, drying)?: A  Lot Help from another person to put on and taking off regular upper body clothing?: A Lot Help from another person to put on and taking off regular lower body clothing?: A Lot 6 Click Score: 13    End of Session    OT Visit Diagnosis: Other abnormalities of gait and mobility (R26.89);Muscle weakness (generalized) (M62.81)   Activity Tolerance Patient limited by lethargy   Patient Left in bed;with call bell/phone within reach;with bed alarm set   Nurse Communication Mobility status;Other (comment) (cognition, purewick soiled.)        Time: 1610-9604 OT Time Calculation (min): 20 min  Charges: OT General Charges $OT Visit: 1 Visit OT Treatments $Self Care/Home  Management : 8-22 mins  Rockney Ghee, M.S., OTR/L 10/07/23, 12:59 PM

## 2023-10-07 NOTE — Plan of Care (Signed)
  Problem: Education: Goal: Ability to describe self-care measures that may prevent or decrease complications (Diabetes Survival Skills Education) will improve Outcome: Progressing Goal: Individualized Educational Video(s) Outcome: Progressing   Problem: Coping: Goal: Ability to adjust to condition or change in health will improve Outcome: Progressing   Problem: Fluid Volume: Goal: Ability to maintain a balanced intake and output will improve Outcome: Progressing   Problem: Health Behavior/Discharge Planning: Goal: Ability to identify and utilize available resources and services will improve Outcome: Progressing Goal: Ability to manage health-related needs will improve Outcome: Progressing   Problem: Metabolic: Goal: Ability to maintain appropriate glucose levels will improve Outcome: Progressing   Problem: Nutritional: Goal: Maintenance of adequate nutrition will improve Outcome: Progressing Goal: Progress toward achieving an optimal weight will improve Outcome: Progressing   Problem: Health Behavior/Discharge Planning: Goal: Ability to manage health-related needs will improve Outcome: Progressing

## 2023-10-07 NOTE — Assessment & Plan Note (Addendum)
1.3 cm right upper lobe spiculated in nature.  Could be a primary lung cancerous process.  Will need outpatient PET scan and likely bronchoscopy for diagnosis.

## 2023-10-07 NOTE — TOC Progression Note (Signed)
Transition of Care [facility]) - Progression Note    Patient Details  Name: ARIYANNA DEVOTO MRN: [id number] Date of Birth: [birth year]  [facility] Care [facility]) CM/SW Contact  Garret Reddish, RN Phone Number: 10/07/2023, 12:58 PM  Clinical Narrative:    Chart reviewed.  I have spoken with patient's son.  He was very hard to understand.  I spoken with him about SNF placement.  He reports that he is not ready to speak about disposition at this time.  He reports that his mother lived at home with him prior to admission.  He reports that he is use to his mother being able to do her own ADL's.  He report his mother has been declining for about 6 months.    I explained to him the SNF process.  He informs me that he is unclear about what is going on with his mother and would like to know more about her condition before speaking about disposition.    I have completed an Fl 2 and faxed out to [place] area facilities.    TOC will continue to follow progress of patient.          Expected Discharge Plan and Services                                               Social Determinants of Health (SDOH) Interventions SDOH Screenings   Food Insecurity: Patient Unable To Answer (10/03/2023)  Recent Concern: Food Insecurity - Food Insecurity Present (09/24/2023)  Housing: High Risk (10/03/2023)  Transportation Needs: Patient Unable To Answer (10/03/2023)  Recent Concern: Transportation Needs - Unmet Transportation Needs (09/24/2023)  Utilities: Patient Unable To Answer (10/03/2023)  Tobacco Use: Medium Risk (10/02/2023)    Readmission Risk Interventions     No data to display

## 2023-10-08 ENCOUNTER — Inpatient Hospital Stay: Payer: 59

## 2023-10-08 ENCOUNTER — Ambulatory Visit: Payer: 59

## 2023-10-08 DIAGNOSIS — J9621 Acute and chronic respiratory failure with hypoxia: Secondary | ICD-10-CM | POA: Diagnosis not present

## 2023-10-08 DIAGNOSIS — E11649 Type 2 diabetes mellitus with hypoglycemia without coma: Secondary | ICD-10-CM | POA: Insufficient documentation

## 2023-10-08 DIAGNOSIS — G9341 Metabolic encephalopathy: Secondary | ICD-10-CM | POA: Diagnosis not present

## 2023-10-08 DIAGNOSIS — J441 Chronic obstructive pulmonary disease with (acute) exacerbation: Secondary | ICD-10-CM | POA: Diagnosis not present

## 2023-10-08 DIAGNOSIS — I110 Hypertensive heart disease with heart failure: Secondary | ICD-10-CM

## 2023-10-08 LAB — GLUCOSE, CAPILLARY
Glucose-Capillary: 203 mg/dL — ABNORMAL HIGH (ref 70–99)
Glucose-Capillary: 246 mg/dL — ABNORMAL HIGH (ref 70–99)
Glucose-Capillary: 249 mg/dL — ABNORMAL HIGH (ref 70–99)
Glucose-Capillary: 250 mg/dL — ABNORMAL HIGH (ref 70–99)
Glucose-Capillary: 253 mg/dL — ABNORMAL HIGH (ref 70–99)
Glucose-Capillary: 266 mg/dL — ABNORMAL HIGH (ref 70–99)

## 2023-10-08 LAB — CSF CELL COUNT WITH DIFFERENTIAL
RBC Count, CSF: 217 /mm3 — ABNORMAL HIGH (ref 0–3)
Tube #: 3
WBC, CSF: 3 /mm3 (ref 0–5)

## 2023-10-08 LAB — CULTURE, BLOOD (ROUTINE X 2)
Culture: NO GROWTH
Culture: NO GROWTH
Special Requests: ADEQUATE

## 2023-10-08 LAB — BLOOD GAS, ARTERIAL
Acid-Base Excess: 8.3 mmol/L — ABNORMAL HIGH (ref 0.0–2.0)
Bicarbonate: 33.4 mmol/L — ABNORMAL HIGH (ref 20.0–28.0)
O2 Content: 2 L/min
O2 Saturation: 98.3 %
Patient temperature: 37
pCO2 arterial: 47 mm[Hg] (ref 32–48)
pH, Arterial: 7.46 — ABNORMAL HIGH (ref 7.35–7.45)
pO2, Arterial: 88 mm[Hg] (ref 83–108)

## 2023-10-08 LAB — PROTEIN AND GLUCOSE, CSF
Glucose, CSF: 155 mg/dL — ABNORMAL HIGH (ref 40–70)
Total  Protein, CSF: 44 mg/dL (ref 15–45)

## 2023-10-08 MED ORDER — INSULIN GLARGINE-YFGN 100 UNIT/ML ~~LOC~~ SOLN
15.0000 [IU] | Freq: Every day | SUBCUTANEOUS | Status: DC
  Filled 2023-10-08: qty 0.15

## 2023-10-08 MED ORDER — INSULIN ASPART 100 UNIT/ML IJ SOLN
0.0000 [IU] | Freq: Every day | INTRAMUSCULAR | Status: DC
  Administered 2023-10-08 – 2023-10-19 (×5): 2 [IU] via SUBCUTANEOUS
  Administered 2023-10-20: 4 [IU] via SUBCUTANEOUS
  Administered 2023-10-25: 3 [IU] via SUBCUTANEOUS
  Administered 2023-10-26: 2 [IU] via SUBCUTANEOUS
  Administered 2023-10-27: 4 [IU] via SUBCUTANEOUS
  Filled 2023-10-08 (×9): qty 1

## 2023-10-08 MED ORDER — SODIUM CHLORIDE 0.9 % IV SOLN
1000.0000 mg | INTRAVENOUS | Status: AC
  Administered 2023-10-08 – 2023-10-12 (×5): 1000 mg via INTRAVENOUS
  Filled 2023-10-08 (×5): qty 16

## 2023-10-08 MED ORDER — LIDOCAINE 1 % OPTIME INJ - NO CHARGE
2.0000 mL | Freq: Once | INTRAMUSCULAR | Status: AC
  Administered 2023-10-08: 2 mL via INTRADERMAL

## 2023-10-08 MED ORDER — INSULIN ASPART 100 UNIT/ML IJ SOLN
0.0000 [IU] | Freq: Three times a day (TID) | INTRAMUSCULAR | Status: DC
  Administered 2023-10-08: 8 [IU] via SUBCUTANEOUS
  Administered 2023-10-09: 5 [IU] via SUBCUTANEOUS
  Administered 2023-10-09: 15 [IU] via SUBCUTANEOUS
  Administered 2023-10-09: 3 [IU] via SUBCUTANEOUS
  Administered 2023-10-10: 5 [IU] via SUBCUTANEOUS
  Administered 2023-10-10 – 2023-10-11 (×2): 8 [IU] via SUBCUTANEOUS
  Administered 2023-10-11: 15 [IU] via SUBCUTANEOUS
  Administered 2023-10-12: 8 [IU] via SUBCUTANEOUS
  Administered 2023-10-12: 3 [IU] via SUBCUTANEOUS
  Administered 2023-10-12: 11 [IU] via SUBCUTANEOUS
  Administered 2023-10-13 (×2): 8 [IU] via SUBCUTANEOUS
  Administered 2023-10-14: 2 [IU] via SUBCUTANEOUS
  Administered 2023-10-14: 3 [IU] via SUBCUTANEOUS
  Administered 2023-10-14: 8 [IU] via SUBCUTANEOUS
  Administered 2023-10-15 – 2023-10-16 (×4): 3 [IU] via SUBCUTANEOUS
  Administered 2023-10-16 – 2023-10-17 (×2): 2 [IU] via SUBCUTANEOUS
  Administered 2023-10-17: 5 [IU] via SUBCUTANEOUS
  Administered 2023-10-17: 11 [IU] via SUBCUTANEOUS
  Administered 2023-10-18 (×2): 2 [IU] via SUBCUTANEOUS
  Administered 2023-10-18 – 2023-10-19 (×2): 5 [IU] via SUBCUTANEOUS
  Administered 2023-10-19: 2 [IU] via SUBCUTANEOUS
  Administered 2023-10-19: 3 [IU] via SUBCUTANEOUS
  Administered 2023-10-20: 15 [IU] via SUBCUTANEOUS
  Administered 2023-10-21: 5 [IU] via SUBCUTANEOUS
  Administered 2023-10-21: 3 [IU] via SUBCUTANEOUS
  Administered 2023-10-21: 8 [IU] via SUBCUTANEOUS
  Administered 2023-10-22: 3 [IU] via SUBCUTANEOUS
  Administered 2023-10-22: 11 [IU] via SUBCUTANEOUS
  Administered 2023-10-22 – 2023-10-23 (×2): 3 [IU] via SUBCUTANEOUS
  Administered 2023-10-23: 8 [IU] via SUBCUTANEOUS
  Administered 2023-10-23: 11 [IU] via SUBCUTANEOUS
  Administered 2023-10-24: 3 [IU] via SUBCUTANEOUS
  Administered 2023-10-24: 8 [IU] via SUBCUTANEOUS
  Administered 2023-10-24: 5 [IU] via SUBCUTANEOUS
  Administered 2023-10-25: 2 [IU] via SUBCUTANEOUS
  Administered 2023-10-25 – 2023-10-26 (×3): 3 [IU] via SUBCUTANEOUS
  Administered 2023-10-26: 8 [IU] via SUBCUTANEOUS
  Administered 2023-10-26: 3 [IU] via SUBCUTANEOUS
  Administered 2023-10-27: 5 [IU] via SUBCUTANEOUS
  Administered 2023-10-27: 8 [IU] via SUBCUTANEOUS
  Administered 2023-10-27: 15 [IU] via SUBCUTANEOUS
  Administered 2023-10-28: 11 [IU] via SUBCUTANEOUS
  Administered 2023-10-28: 15 [IU] via SUBCUTANEOUS
  Filled 2023-10-08 (×54): qty 1

## 2023-10-08 MED ORDER — QUETIAPINE FUMARATE 25 MG PO TABS
25.0000 mg | ORAL_TABLET | Freq: Every day | ORAL | Status: DC
  Administered 2023-10-08 – 2023-10-10 (×3): 25 mg via ORAL
  Filled 2023-10-08 (×3): qty 1

## 2023-10-08 MED ORDER — INSULIN GLARGINE-YFGN 100 UNIT/ML ~~LOC~~ SOLN
17.0000 [IU] | Freq: Every day | SUBCUTANEOUS | Status: DC
  Administered 2023-10-08: 17 [IU] via SUBCUTANEOUS
  Filled 2023-10-08 (×2): qty 0.17

## 2023-10-08 MED ORDER — INSULIN ASPART 100 UNIT/ML IJ SOLN
2.0000 [IU] | Freq: Three times a day (TID) | INTRAMUSCULAR | Status: DC
  Administered 2023-10-09 (×2): 2 [IU] via SUBCUTANEOUS

## 2023-10-08 NOTE — Progress Notes (Signed)
Eeg done 

## 2023-10-08 NOTE — Procedures (Signed)
Technically successful fluoro guided LP at L2-3 level with opening pressure of 15 cm H2O   12 cc of clear, colorless CSF sent to lab for analysis.  No immediate post procedural complication.  - Bedrest x 12 hours with bathroom privileges - Head of bed flat x 6 hours, may sit up x 10 minutes to eat - Encourage PO fluids - Further plans per primary team  Please see imaging section of Epic for full dictation.  Lynnette Caffey, PA-C

## 2023-10-08 NOTE — Inpatient Diabetes Management (Signed)
Inpatient Diabetes Program Recommendations  AACE/ADA: New Consensus Statement on Inpatient Glycemic Control   Target Ranges:  Prepandial:   less than 140 mg/dL      Peak postprandial:   less than 180 mg/dL (1-2 hours)      Critically ill patients:  140 - 180 mg/dL    Latest Reference Range & Units 10/08/23 00:36 10/08/23 04:03  Glucose-Capillary 70 - 99 mg/dL 161 (H) 096 (H)    Latest Reference Range & Units 10/07/23 07:59 10/07/23 12:14 10/07/23 17:35 10/07/23 17:49 10/07/23 17:58 10/07/23 20:43  Glucose-Capillary 70 - 99 mg/dL 045 (H) 409 (H) 45 (L) 58 (L) 80 262 (H)   Review of Glycemic Control  Diabetes history: DM2 Outpatient Diabetes medications: Glipizide 10 mg daily, Tradjenta 5 mg daily Current orders for Inpatient glycemic control: None; CBGs  Inpatient Diabetes Program Recommendations:    Insulin: Noted glucose down to 45 mg/dl at 81:19 on 14/7 and all insulin discontinued.  CBGs over past 12 hours has ranged from 246-262 mg/dl. Please consider reordering Semglee 10 units daily, Novoolog 0-9 units TID with meals and Novolog 0-5 units at bedtime.  Thanks, Orlando Penner, RN, MSN, CDCES Diabetes Coordinator Inpatient Diabetes Program [phone] (Team Pager from 8am to 5pm)

## 2023-10-08 NOTE — TOC Initial Note (Signed)
Transition of Care [facility]) - Initial/Assessment Note    Patient Details  Name: Michele House MRN: [id number] Date of Birth: [birth year]  [facility]) CM/SW Contact:    Marlowe Sax, RN Phone Number: 10/08/2023, 9:19 AM  Clinical Narrative:                 TOC will continue to follow and assist with needs and DC planning, Next week will review options for STR once medically ready  Expected Discharge Plan: Skilled Nursing Facility Barriers to Discharge: Continued Medical Work up   Patient Goals and CMS Choice            Expected Discharge Plan and Services                                              Prior Living Arrangements/Services                       Activities of Daily Living   ADL Screening (condition at time of admission) Independently performs ADLs?: Yes (appropriate for developmental age) Is the patient deaf or have difficulty hearing?: No Does the patient have difficulty seeing, even when wearing glasses/contacts?: No Does the patient have difficulty concentrating, remembering, or making decisions?: No  Permission Sought/Granted                  Emotional Assessment              Admission diagnosis:  Other chest pain [R07.89] Altered mental status [R41.82] COPD exacerbation (HCC) [J44.1] Patient Active Problem List   Diagnosis Date Noted   Acute metabolic encephalopathy 10/06/2023   Altered mental status 10/02/2023   UTI (urinary tract infection) 10/02/2023   (HFpEF) heart failure with preserved ejection fraction (HCC) 10/02/2023   Pneumonia 09/24/2023   COPD (chronic obstructive pulmonary disease) (HCC) 09/24/2023   Acute CHF (congestive heart failure) (HCC) 09/23/2023   COPD exacerbation (HCC) 06/07/2023   Acute on chronic respiratory failure with hypoxia (HCC) 06/07/2023   Lung nodule 06/07/2023   Allergic reaction 06/07/2023   Chest discomfort 10/12/2013   Tobacco use disorder 10/12/2013    Essential hypertension 10/12/2013   Obesity 10/12/2013   Type 2 diabetes mellitus with obesity (HCC) 10/12/2013   PCP:  [facility]:   [facility] - [place], [place] - [address] [place] [address] Phone: [phone] Fax: [phone]  [facility] [address], [place] - [address] [place] [address] Phone: [phone] Fax: [phone]     Social Determinants of Health (SDOH) Social History: SDOH Screenings   Food Insecurity: Patient Unable To Answer (10/03/2023)  Recent Concern: Food Insecurity - Food Insecurity Present (09/24/2023)  Housing: High Risk (10/03/2023)  Transportation Needs: Patient Unable To Answer (10/03/2023)  Recent Concern: Transportation Needs - Unmet Transportation Needs (09/24/2023)  Utilities: Patient Unable To Answer (10/03/2023)  Tobacco Use: Medium Risk (10/02/2023)   SDOH Interventions:     Readmission Risk Interventions     No data to display

## 2023-10-08 NOTE — Plan of Care (Signed)

## 2023-10-08 NOTE — Progress Notes (Signed)
Progress Note   Patient: Michele House DOB: September 12, 1956 DOA: 10/02/2023     6 DOS: the patient was seen and examined on 10/08/2023   Brief hospital course: Taken from H&P.  Michele House is a 67 y.o. female with medical history significant of COPD, chronic respiratory failure on 2 L, hypertension, type 2 diabetes, HFpEF presenting with acute on chronic respiratory failure with hypoxia, COPD exacerbation, pneumonia, UTI, encephalopathy.  Patient reports generalized malaise over the past 3 to 4 days.  Patient noted to have been recently admitted in the hospital November 21 through November 24 for similar issues including COPD exacerbation.  Patient reports not taking her medication since discharge.  Presented to the ER afebrile, heart rate 100s, BP stable.  Satting 96% on 3 L nasal cannula.  White count 23.4, hemoglobin 15, platelets 401, lactate 1.6, procalcitonin less than 0.1, troponin 18, urinalysis indicative of infection.  Urine drug screen negative.  Chest x-ray with a right sided pneumonia.  BNP within normal limits.  Creatinine 1.03.  Glucose 368.EKG sinus tachycardia.  Admitted for concern of pneumonia/COPD exacerbation and UTI. Started on Rocephin and azithromycin.  12/1: Vital stable, respiratory viral panel negative, procalcitonin negative, preliminary blood cultures negative.  Urine cultures are ordered as add-on, ammonia levels mildly elevated at 39, strep pneumo negative, CBG elevated at 244, history of enlarging pulmonary nodule with recommendations for PET/CT during most recent admission which has not been done yet, if video bronchoscopy was scheduled for 10/13/2023 as outpatient.  Unable to take care of himself and son cannot provide the required care as she required 24-hour supervision due to worsening dementia.  PT is recommending SNF.  12/2: Hemodynamically stable, now on baseline oxygen of 2 L.  Needs SNF placement. Urine cultures pending.  12/3: Blood  pressure started trending up, restarting home Zestoretic.  Urine cultures with strep agalactiae, penicillin allergy noted, will continue with ceftriaxone for now.  Also started on Remeron for concern of worsening depression and unable to sleep at night  12/4.  Some very concerned about patient's mental status not being seen.  Patient receiving antibiotics already and should have improved by now.  Will hold Lipitor.  Restart low-dose Zoloft.  Discontinue Remeron and do Seroquel at night. 12/5.  Patient has some blurry vision and weakness and pain.  MRI of the brain negative for acute intracranial abnormality. 12/6.  Patient stated she slept well.  Patient able to lift up her arms up off the bed today.  Patient thought she heard her son's voice outside the room.     Assessment and Plan: * Acute metabolic encephalopathy Patient's mental status has not improved.  MRI of the brain negative for acute intracranial abnormality, chronic microvascular ischemic changes.  Discontinued Lipitor.  Patient has elevated vitamin B12, nonreactive RPR, TSH in the normal range.  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  Lumbar puncture done today.  Neurology okay with starting high-dose steroids for 5 days.  Uncontrolled type 2 diabetes mellitus with hypoglycemia, without long-term current use of insulin (HCC) Patient hypoglycemic last night and needed to be started on D5 drip because she was not eating very well.  Will stop D5 drip today put back on sliding scale since starting high-dose steroids.  Will put back on long-acting insulin and adjust as needed.  COPD exacerbation (HCC) Resolved  Acute on chronic respiratory failure with hypoxia [facility]) Patient  initially required 3 L now back to 2 L.  Lung nodule 1.3 cm right upper lobe spiculated in nature.  Could be a primary  lung cancerous process.  Pneumonia Completed 5 days of Rocephin and Zithromax  UTI (urinary tract infection) Streptococcus agalactiae growing out of urine culture.  Completed Rocephin  (HFpEF) heart failure with preserved ejection fraction (HCC) Chronic in nature.  Not in acute exacerbation.  No signs of heart failure currently.  2D echo 09/2023 showed LVEF 55 to 60%, no regional wall motion abnormalities    Essential hypertension Blood pressure a little bit on the lower side held antihypertensive medications.  Type 2 diabetes mellitus with obesity (HCC) Patient had hypoglycemia last night secondary to not eating very well.  Since starting high-dose steroids we will get rid of D5 drip and put back on sliding scale and start long-acting insulin also.  BMI 30.88.        Subjective: Patient thought she heard her son's voice while I was in the room.  She is more talkative today and able to move her arms better today.  Physical Exam: Vitals:   10/07/23 0414 10/07/23 0757 10/07/23 2129 10/08/23 0835  BP: (!) 85/61 (!) 100/50 (!) 150/78 (!) 148/84  Pulse: 83 80 82 100  Resp: 18 16 18 18   Temp: (!) 97.3 F (36.3 C) 98.9 F (37.2 C) 98.1 F (36.7 C) 98 F (36.7 C)  TempSrc:  Oral Axillary Oral  SpO2: 93% 99% 95% 100%  Weight:      Height:       Physical Exam HENT:     Head: Normocephalic.     Mouth/Throat:     Pharynx: No oropharyngeal exudate.  Eyes:     General: Lids are normal.     Conjunctiva/sclera: Conjunctivae normal.  Cardiovascular:     Rate and Rhythm: Normal rate and regular rhythm.     Heart sounds: Normal heart sounds, S1 normal and S2 normal.  Pulmonary:     Breath sounds: No decreased breath sounds, wheezing, rhonchi or rales.  Abdominal:     Palpations: Abdomen is soft.     Tenderness: There is no abdominal tenderness.  Musculoskeletal:     Right lower leg: No swelling.     Left lower leg: No swelling.  Skin:    General: Skin is warm.     Findings:  No rash.  Neurological:     Mental Status: She is alert. She is confused.     Comments: More talkative today.  Able to lift her arms up off the bed today.  Did not straight leg raise for me     Data Reviewed: CSF, total protein 44, white blood cell count 3, red blood cell count to 17, glucose 155. ABG shows a pH of 7.46, pCO2 of 47, PaO2 of 88  Family Communication: Updated son on the phone  Disposition: Status is: Inpatient Remains inpatient appropriate because: Patient had lumbar puncture today.  Neurology wants to do 5 days of high-dose IV steroids  Planned Discharge Destination: To be determined    Time spent: 27 minutes Case discussed with neurology  Author: Alford Highland, MD 10/08/2023 2:34 PM  For on call review [email or web address].

## 2023-10-08 NOTE — Assessment & Plan Note (Signed)
With high-dose Solu-Medrol needed to restart insulin.  Will increase insulin to 25 units long-acting insulin plus short acting insulin and sliding scale prior to meals.

## 2023-10-09 DIAGNOSIS — J9621 Acute and chronic respiratory failure with hypoxia: Secondary | ICD-10-CM | POA: Diagnosis not present

## 2023-10-09 DIAGNOSIS — E11649 Type 2 diabetes mellitus with hypoglycemia without coma: Secondary | ICD-10-CM | POA: Diagnosis not present

## 2023-10-09 DIAGNOSIS — J441 Chronic obstructive pulmonary disease with (acute) exacerbation: Secondary | ICD-10-CM | POA: Diagnosis not present

## 2023-10-09 DIAGNOSIS — G9341 Metabolic encephalopathy: Secondary | ICD-10-CM | POA: Diagnosis not present

## 2023-10-09 LAB — CBC
HCT: 40.6 % (ref 36.0–46.0)
Hemoglobin: 13.2 g/dL (ref 12.0–15.0)
MCH: 28.8 pg (ref 26.0–34.0)
MCHC: 32.5 g/dL (ref 30.0–36.0)
MCV: 88.5 fL (ref 80.0–100.0)
Platelets: 217 10*3/uL (ref 150–400)
RBC: 4.59 MIL/uL (ref 3.87–5.11)
RDW: 13.6 % (ref 11.5–15.5)
WBC: 6.5 10*3/uL (ref 4.0–10.5)
nRBC: 0 % (ref 0.0–0.2)

## 2023-10-09 LAB — COMPREHENSIVE METABOLIC PANEL
ALT: 18 U/L (ref 0–44)
AST: 18 U/L (ref 15–41)
Albumin: 3.5 g/dL (ref 3.5–5.0)
Alkaline Phosphatase: 79 U/L (ref 38–126)
Anion gap: 10 (ref 5–15)
BUN: 33 mg/dL — ABNORMAL HIGH (ref 8–23)
CO2: 29 mmol/L (ref 22–32)
Calcium: 9 mg/dL (ref 8.9–10.3)
Chloride: 107 mmol/L (ref 98–111)
Creatinine, Ser: 0.98 mg/dL (ref 0.44–1.00)
GFR, Estimated: >60 mL/min (ref >60)
Glucose, Bld: 320 mg/dL — ABNORMAL HIGH (ref 70–99)
Potassium: 3.9 mmol/L (ref 3.5–5.1)
Sodium: 146 mmol/L — ABNORMAL HIGH (ref 135–145)
Total Bilirubin: 1.2 mg/dL — ABNORMAL HIGH (ref <1.2)
Total Protein: 6.1 g/dL — ABNORMAL LOW (ref 6.5–8.1)

## 2023-10-09 LAB — GLUCOSE, CAPILLARY
Glucose-Capillary: 155 mg/dL — ABNORMAL HIGH (ref 70–99)
Glucose-Capillary: 175 mg/dL — ABNORMAL HIGH (ref 70–99)
Glucose-Capillary: 192 mg/dL — ABNORMAL HIGH (ref 70–99)
Glucose-Capillary: 196 mg/dL — ABNORMAL HIGH (ref 70–99)
Glucose-Capillary: 225 mg/dL — ABNORMAL HIGH (ref 70–99)
Glucose-Capillary: 395 mg/dL — ABNORMAL HIGH (ref 70–99)

## 2023-10-09 MED ORDER — INSULIN ASPART 100 UNIT/ML IJ SOLN
4.0000 [IU] | Freq: Three times a day (TID) | INTRAMUSCULAR | Status: DC
  Administered 2023-10-09 – 2023-10-10 (×3): 4 [IU] via SUBCUTANEOUS
  Filled 2023-10-09 (×3): qty 1

## 2023-10-09 MED ORDER — INSULIN GLARGINE-YFGN 100 UNIT/ML ~~LOC~~ SOLN
25.0000 [IU] | Freq: Every day | SUBCUTANEOUS | Status: DC
  Administered 2023-10-09: 25 [IU] via SUBCUTANEOUS
  Filled 2023-10-09 (×2): qty 0.25

## 2023-10-09 NOTE — Plan of Care (Signed)
  Problem: Education: Goal: Ability to describe self-care measures that may prevent or decrease complications (Diabetes Survival Skills Education) will improve Outcome: Progressing   Problem: Education: Goal: Ability to describe self-care measures that may prevent or decrease complications (Diabetes Survival Skills Education) will improve Outcome: Progressing Goal: Individualized Educational Video(s) Outcome: Progressing   Problem: Coping: Goal: Ability to adjust to condition or change in health will improve Outcome: Progressing   Problem: Fluid Volume: Goal: Ability to maintain a balanced intake and output will improve Outcome: Progressing

## 2023-10-09 NOTE — Progress Notes (Signed)
Physical Therapy Treatment Patient Details Name: Michele House MRN: [id number] DOB: 1955/12/28 Today's Date: 10/09/2023   History of Present Illness Pt is a 67 y/o F admitted on 10/02/23 after presenting with acute on chronic respiratory failure with hypoxia, COPD exacerbation, PNA, UTI, & encephalopathy. PMH: COPD, chronic respiratory failure on 2L,HTN, DM2, HFpEF    PT Comments  Pt is progressing demonstrating a greater ability to initiate, attend to, and follow through with mobility tasks (supervision with bed mobility and transfers and Min A with gait with RW). Noted that after about 20 min pt's tolerance to attend and follow through with commands decreased, and pt required increased time to respond to simple questions or commands. Pt required Mod vc's and or manual cues for hand placement for gait with RW and manual assist to maneuver walker  (pt would only look straight ahead and use peripheral vision vs. Turning her head even with multimodal cues).  Continued PT will assist pt towards  greater awareness and attention during mobility to increase safety with functional mobility.   If plan is discharge home, recommend the following: A little help with bathing/dressing/bathroom;A little help with walking and/or transfers;Assistance with cooking/housework;Supervision due to cognitive status;Direct supervision/assist for financial management;Help with stairs or ramp for entrance;Assist for transportation;Direct supervision/assist for medications management;Assistance with feeding   Can travel by private vehicle     Yes  Equipment Recommendations  Rolling walker (2 wheels)    Recommendations for Other Services       Precautions / Restrictions Precautions Precautions: Fall Restrictions Weight Bearing Restrictions: No     Mobility  Bed Mobility Overal bed mobility: Modified Independent Bed Mobility: Supine to Sit     Supine to sit: HOB elevated, Supervision     General bed  mobility comments: Pt was able to initiate and follow through with mobility.    Transfers Overall transfer level: Needs assistance Equipment used: Rolling walker (2 wheels) Transfers: Sit to/from Stand, Bed to chair/wheelchair/BSC Sit to Stand: Supervision   Step pivot transfers: Min assist       General transfer comment: pt able to attend, initiate, and follow through with task with Min A to manuever RW with turns to sit.    Ambulation/Gait Ambulation/Gait assistance: Contact guard assist Gait Distance (Feet): 30 Feet Assistive device: Rolling walker (2 wheels) Gait Pattern/deviations: Decreased step length - right, Decreased stride length, Decreased step length - left, Step-to pattern Gait velocity: decreased     General Gait Details: Pt able to keep hands on 2 wheeled walker but required manual guidance to turn walker.   Stairs         General stair comments: NT   Wheelchair Mobility     Tilt Bed    Modified Rankin (Stroke Patients Only)       Balance Overall balance assessment: Needs assistance Sitting-balance support: Feet supported, No upper extremity supported Sitting balance-Leahy Scale: Good     Standing balance support: Bilateral upper extremity supported, During functional activity, Reliant on assistive device for balance, No upper extremity supported Standing balance-Leahy Scale: Good Standing balance comment: able to march in place without UE support.                            Cognition Arousal: Lethargic Behavior During Therapy: Flat affect Overall Cognitive Status: No family/caregiver present to determine baseline cognitive functioning  General Comments: Pt confused/lethargic t/o session. Slow to respond to questions and often noted to repeat questions/words rather than answer. Requires Mod multimodal cueing to attend to functional activity during session.        Exercises       General Comments General comments (skin integrity, edema, etc.): Pt recieved on 2 L O2 SPO2 94% and HR 104 bpm after marching in place standing EOB.      Pertinent Vitals/Pain Pain Assessment Pain Assessment: No/denies pain    Home Living                          Prior Function            PT Goals (current goals can now be found in the care plan section) Acute Rehab PT Goals Patient Stated Goal: none stated PT Goal Formulation: With patient Time For Goal Achievement: 10/17/23 Potential to Achieve Goals: Good Progress towards PT goals: Progressing toward goals    Frequency    Min 1X/week      PT Plan      Co-evaluation              AM-PAC PT "6 Clicks" Mobility   Outcome Measure  Help needed turning from your back to your side while in a flat bed without using bedrails?: None Help needed moving from lying on your back to sitting on the side of a flat bed without using bedrails?: A Little Help needed moving to and from a bed to a chair (including a wheelchair)?: A Little Help needed standing up from a chair using your arms (e.g., wheelchair or bedside chair)?: A Little Help needed to walk in hospital room?: A Little Help needed climbing 3-5 steps with a railing? : A Little 6 Click Score: 19    End of Session Equipment Utilized During Treatment: Oxygen (2 L) Activity Tolerance: Patient tolerated treatment well Patient left: in chair;with chair alarm set;with call bell/phone within reach Nurse Communication: Mobility status PT Visit Diagnosis: Unsteadiness on feet (R26.81);Other abnormalities of gait and mobility (R26.89);Muscle weakness (generalized) (M62.81);Difficulty in walking, not elsewhere classified (R26.2)     Time: 0865-7846 PT Time Calculation (min) (ACUTE ONLY): 31 min  Charges:    $Therapeutic Activity: 23-37 mins PT General Charges $$ ACUTE PT VISIT: 1 Visit                     Hortencia Conradi, PTA  10/09/23, 10:04 AM

## 2023-10-09 NOTE — Progress Notes (Signed)
Neurology progress note  S: Patient's sx unchanged, she is undergoing LP today  Vitals:   10/08/23 2323 10/09/23 0441  BP: (!) 143/63 (!) 114/58  Pulse: (!) 107 88  Resp: 18 20  Temp: 98.3 F (36.8 C) 98.9 F (37.2 C)  SpO2: 99% 100%   Gen: patient lying in bed, NAD CV: extremities appear well-perfused Resp: normal WOB   Neurologic exam MS: alert, able to answer orientation questions (reports place as hospital, year 2024), actively responding to internal stimuli in the room and having a conversation with her son Onalee Hua who was not present during the exam just as she was yesterday Speech: mild dysarthria, no aphasia CN: PERRL, VFF, EOMI, sensation intact, face symmetric, hearing intact to voice Motor: 4/5 strength diffusely formal strength exam 2/2 effort and AMS, bradykinetic but not rigid Sensory: SILT Reflexes: 2+ brisk symm with toes equiv bilat Coordination: FNF UTA 2/2 confusion Gait: deferred  MRI Brain 1. No acute intracranial abnormality. 2. Confluent hyperintense T2-weighted signal within the superior bihemispheric white matter, most commonly indicating chronic microvascular ischemia.   CNS imaging personally reviewed, I agree with above interpretation   CTA chest 09/24/23 No evidence of pulmonary embolus.   Patchy bilateral ground-glass and nodular opacities, right greater than left. Appearance is concerning for pneumonia.   Enlarging spiculated nodule in the inferior right upper lobe now 1.6 cm compared to 1.3 cm previously. Recommend further evaluation with PET CT.  A/P: This is a 67 year old woman with past medical history significant for COPD, chronic respiratory failure on 2 L of home O2, hypertension, type 2 diabetes, heart failure with preserved ejection fraction who presented with acute on chronic respiratory failure with hypoxia, COPD exacerbation, pneumonia, and UTI. Neurology was consulted for altered mental status. She does seem to have some acute  delirium associated with the hospital stay and is hallucinating during our examination which is a new symptom for her. However I spoke to her son and daughter both at length who report a rapidly progressive decline from her baseline cognitive status approximately 6 months ago. She is now unable to care for herself and PT is recommending SNF. Workup for reversible causes of dementia has thus far been unremarkable. She was found to have a spiculated nodule in the inferior right upper lobe which has now increased in size as of most recent CTA chest on November 22. Further evaluation has been recommended with the PET/CT. 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 Recommend IR guided LP today for autoimmune encephalopathy send off to [facility]. After the LP is completed would recommend a course of high-dose steroids for empiric treatment of autoimmune encephalitis.   - IR-guided LP today for cell count x2 tubes, glucose, protein, gram stain, culture, cytology, ENC2 autoimmune encephalopathy panel [facility] - Serum autoimmune encephalopathy panel ENS2 [facility] - Start 5 day course high-dose steroids today 1g q 24 hrs. We may need to increase her seroquel temporarily while she is on high-dose steroids since she is already having hallucinations (although for now is redirectable and calm) - Neurology will continue to follow  Bing Neighbors, MD [facility] [phone]  If 7pm- 7am, please page neurology on call as listed in AMION.

## 2023-10-09 NOTE — Progress Notes (Signed)
Progress Note   Patient: Michele House ZOX:[id number] DOB: 23-May-1956 DOA: 10/02/2023     7 DOS: the patient was seen and examined on 10/09/2023   Brief hospital course: Taken from H&P.  Michele House is a 67 y.o. female with medical history significant of COPD, chronic respiratory failure on 2 L, hypertension, type 2 diabetes, HFpEF presenting with acute on chronic respiratory failure with hypoxia, COPD exacerbation, pneumonia, UTI, encephalopathy.  Patient reports generalized malaise over the past 3 to 4 days.  Patient noted to have been recently admitted in the hospital November 21 through November 24 for similar issues including COPD exacerbation.  Patient reports not taking her medication since discharge.  Presented to the ER afebrile, heart rate 100s, BP stable.  Satting 96% on 3 L nasal cannula.  White count 23.4, hemoglobin 15, platelets 401, lactate 1.6, procalcitonin less than 0.1, troponin 18, urinalysis indicative of infection.  Urine drug screen negative.  Chest x-ray with a right sided pneumonia.  BNP within normal limits.  Creatinine 1.03.  Glucose 368.EKG sinus tachycardia.  Admitted for concern of pneumonia/COPD exacerbation and UTI. Started on Rocephin and azithromycin.  12/1: Vital stable, respiratory viral panel negative, procalcitonin negative, preliminary blood cultures negative.  Urine cultures are ordered as add-on, ammonia levels mildly elevated at 39, strep pneumo negative, CBG elevated at 244, history of enlarging pulmonary nodule with recommendations for PET/CT during most recent admission which has not been done yet, if video bronchoscopy was scheduled for 10/13/2023 as outpatient.  Unable to take care of himself and son cannot provide the required care as she required 24-hour supervision due to worsening dementia.  PT is recommending SNF.  12/2: Hemodynamically stable, now on baseline oxygen of 2 L.  Needs SNF placement. Urine cultures pending.  12/3: Blood  pressure started trending up, restarting home Zestoretic.  Urine cultures with strep agalactiae, penicillin allergy noted, will continue with ceftriaxone for now.  Also started on Remeron for concern of worsening depression and unable to sleep at night  12/4.  Some very concerned about patient's mental status not being seen.  Patient receiving antibiotics already and should have improved by now.  Will hold Lipitor.  Restart low-dose Zoloft.  Discontinue Remeron and do Seroquel at night. 12/5.  Patient has some blurry vision and weakness and pain.  MRI of the brain negative for acute intracranial abnormality. 12/6.  Patient stated she slept well.  Patient able to lift up her arms up off the bed today.  Patient thought she heard her son's voice outside the room.     Assessment and Plan: * Acute metabolic encephalopathy Patient's mental status better than the last few days but not back to baseline.  Still has a little bit of confusion.  As per son, did recognize every visitor that she had in the room today.  MRI of the brain negative for acute intracranial abnormality, chronic microvascular ischemic changes.  Discontinued Lipitor.  Patient has elevated vitamin B12, nonreactive RPR, TSH in the normal range.  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  Lumbar puncture done 12/6 with testing sent to the [facility].  Started high-dose Solu-Medrol on 12/6.  Patient received 5 days of high-dose Solu-Medrol.  Uncontrolled type 2 diabetes mellitus with hypoglycemia, without long-term current use of insulin (HCC) With high-dose Solu-Medrol needed to restart insulin.  Will increase insulin to 25 units long-acting insulin plus short  acting insulin and sliding scale prior to meals.  COPD exacerbation (HCC) Resolved  Acute on chronic respiratory failure with hypoxia  (HCC) Patient initially required 3 L now back to 2 L.  Lung nodule 1.3 cm right upper lobe spiculated in nature.  Could be a primary lung cancerous process.  Pneumonia Completed 5 days of Rocephin and Zithromax  UTI (urinary tract infection) Streptococcus agalactiae growing out of urine culture.  Completed Rocephin  (HFpEF) heart failure with preserved ejection fraction (HCC) Chronic in nature.  Not in acute exacerbation.  No signs of heart failure currently.  2D echo 09/2023 showed LVEF 55 to 60%, no regional wall motion abnormalities    Essential hypertension Currently holding antihypertensive medication.  Type 2 diabetes mellitus with obesity (HCC) Sugars high now that she is on high-dose Solu-Medrol.  BMI 30.88        Subjective: Patient will sitting up in the chair this morning when I saw her.  She felt okay.  Offers no complaints.  I put the fork in her hand and she was able to feed herself 2 bites of food while I was in the room.  Asked for a bath.  Still has some confusion but much improved from the last couple days.  Physical Exam: Vitals:   10/08/23 1816 10/08/23 2323 10/09/23 0441 10/09/23 0833  BP: (!) 157/74 (!) 143/63 (!) 114/58 (!) 109/58  Pulse: 99 (!) 107 88 95  Resp: 17 18 20 17   Temp: 97.8 F (36.6 C) 98.3 F (36.8 C) 98.9 F (37.2 C)   TempSrc: Oral     SpO2: 99% 99% 100% 100%  Weight:      Height:       Physical Exam HENT:     Head: Normocephalic.     Mouth/Throat:     Pharynx: No oropharyngeal exudate.  Eyes:     General: Lids are normal.     Conjunctiva/sclera: Conjunctivae normal.  Cardiovascular:     Rate and Rhythm: Normal rate and regular rhythm.     Heart sounds: Normal heart sounds, S1 normal and S2 normal.  Pulmonary:     Breath sounds: No decreased breath sounds, wheezing, rhonchi or rales.  Abdominal:     Palpations: Abdomen is soft.     Tenderness: There is no abdominal tenderness.  Musculoskeletal:     Right lower leg: No  swelling.     Left lower leg: No swelling.  Skin:    General: Skin is warm.     Findings: No rash.  Neurological:     Mental Status: She is alert. She is confused.     Comments: More talkative today.  Able to lift her arms up off the bed today.  Did not straight leg raise for me     Data Reviewed: Sodium 146, BUN 33, creatinine 0.98, CBC white blood cell count 6.5, hemoglobin 13.2, platelet count 217  Family Communication: Updated son on the phone  Disposition: Status is: Inpatient Remains inpatient appropriate because: Will receive day 2 of 5 of high-dose Solu-Medrol today  Planned Discharge Destination: Rehab    Time spent: 28 minutes  Author: Alford Highland, MD 10/09/2023 1:06 PM  For on call review [email or web address].

## 2023-10-09 NOTE — Procedures (Signed)
Routine EEG Report  Michele House is a 66 y.o. female with a history of altered mental status who is undergoing an EEG to evaluate for seizures.  Report: This EEG was acquired with electrodes placed according to the International 10-20 electrode system (including Fp1, Fp2, F3, F4, C3, C4, P3, P4, O1, O2, T3, T4, T5, T6, A1, A2, Fz, Cz, Pz). The following electrodes were missing or displaced: none.  The occipital dominant rhythm was 6-7 Hz. This activity is reactive to stimulation. Drowsiness was manifested by background fragmentation; deeper stages of sleep were identified by K complexes and sleep spindles. There was no focal slowing. There were no interictal epileptiform discharges. There were no electrographic seizures identified. There was no abnormal response to photic stimulation or hyperventilation.   Impression and clinical correlation: This EEG was obtained while awake and asleep and is abnormal due to mild diffuse slowing indicative of global cerebral dysfunction. Epileptiform abnormalities were not seen during this recording.  Bing Neighbors, MD [facility] [phone]  If 7pm- 7am, please page neurology on call as listed in AMION.

## 2023-10-10 DIAGNOSIS — J9621 Acute and chronic respiratory failure with hypoxia: Secondary | ICD-10-CM | POA: Diagnosis not present

## 2023-10-10 DIAGNOSIS — E11649 Type 2 diabetes mellitus with hypoglycemia without coma: Secondary | ICD-10-CM | POA: Diagnosis not present

## 2023-10-10 DIAGNOSIS — R911 Solitary pulmonary nodule: Secondary | ICD-10-CM | POA: Diagnosis not present

## 2023-10-10 DIAGNOSIS — G9341 Metabolic encephalopathy: Secondary | ICD-10-CM | POA: Diagnosis not present

## 2023-10-10 LAB — GLUCOSE, CAPILLARY
Glucose-Capillary: 283 mg/dL — ABNORMAL HIGH (ref 70–99)
Glucose-Capillary: 244 mg/dL — ABNORMAL HIGH (ref 70–99)
Glucose-Capillary: 58 mg/dL — ABNORMAL LOW (ref 70–99)
Glucose-Capillary: 112 mg/dL — ABNORMAL HIGH (ref 70–99)
Glucose-Capillary: 135 mg/dL — ABNORMAL HIGH (ref 70–99)
Glucose-Capillary: 190 mg/dL — ABNORMAL HIGH (ref 70–99)
Glucose-Capillary: 207 mg/dL — ABNORMAL HIGH (ref 70–99)

## 2023-10-10 MED ORDER — INSULIN GLARGINE-YFGN 100 UNIT/ML ~~LOC~~ SOLN
28.0000 [IU] | Freq: Every day | SUBCUTANEOUS | Status: DC
  Administered 2023-10-10 – 2023-10-11 (×2): 28 [IU] via SUBCUTANEOUS
  Filled 2023-10-10 (×3): qty 0.28

## 2023-10-10 MED ORDER — LISINOPRIL 20 MG PO TABS
20.0000 mg | ORAL_TABLET | Freq: Every day | ORAL | Status: DC
  Administered 2023-10-10: 20 mg via ORAL
  Filled 2023-10-10: qty 1

## 2023-10-10 NOTE — Progress Notes (Signed)
NEUROLOGY CONSULT FOLLOW UP NOTE   Date of service: October 10, 2023 Patient Name: Michele House MRN:  [id number] DOB:  11/19/1955  Brief HPI   This is a 67 year old woman with past medical history significant for COPD, chronic respiratory failure on 2 L of home O2, hypertension, type 2 diabetes, heart failure with preserved ejection fraction who presented with acute on chronic respiratory failure with hypoxia, COPD exacerbation, pneumonia, and UTI.  Neurology was consulted for altered mental status however upon further discussions with her family her clinical presentation is more consistent with rapidly progressive dementia with patient at baseline 6 months ago now requiring 24-hour supervision and assistance with her ADLs.  She has a spiculated nodule in in the right upper lobe which is increased in size and most recent CTA chest performed 2 weeks ago.  Given what appears to be a rapidly progressive dementia in the setting of possible pulmonary malignancy, patient is currently undergoing 5-day treatment with high-dose steroids for empiric treatment of autoimmune encephalitis while awaiting autoantibody panels to result.   Interval Hx/subjective   Patient appeared to be significantly improved yesterday was fully oriented, not hallucinating.  Today she is oriented only to self in the hospital and expresses paranoia but not hallucinations.  She repeatedly tells me that her son Michele Hua is not present in the room but is watching her every move and asked me to have him stop spying on her using multiple expletives.  Vitals   Vitals:   10/09/23 1614 10/09/23 2336 10/10/23 0803 10/10/23 1442  BP: (!) 155/73 (!) 129/109 (!) 180/83 133/83  Pulse:   (!) 110 (!) 110  Resp: 16 18 17 16   Temp: 98.4 F (36.9 C) 98 F (36.7 C) (!) 97.3 F (36.3 C) 98.2 F (36.8 C)  TempSrc: Oral Oral Oral Oral  SpO2: 98%  97% 97%  Weight:      Height:         Body mass index is 30.88 kg/m.  Physical Exam    Constitutional: Appears well-developed and well-nourished.  Psych: paranoid Eyes: No scleral injection.  HENT: No OP obstrucion.  Head: Normocephalic.  Cardiovascular: Normal rate and regular rhythm. Respiratory: Effort normal, non-labored breathing.  GI: Soft.  No distension. There is no tenderness.  Skin: WDI.   Neurologic Examination   Neurologic exam MS: alert, oriented to self and hospital only today, becomes confused in conversation Speech: mild dysarthria, no aphasia CN: PERRL, VFF, EOMI, sensation intact, face symmetric, hearing intact to voice Motor: 4/5 strength diffusely formal strength exam 2/2 effort and AMS, bradykinetic but not rigid Sensory: SILT Reflexes: 2+ brisk symm with toes equiv bilat Coordination: FNF UTA 2/2 confusion Gait: deferred  Medications  Current Facility-Administered Medications:    acetaminophen (TYLENOL) tablet 650 mg, 650 mg, Oral, Q6H PRN, Floydene Flock, MD, 650 mg at 10/10/23 0601   dextromethorphan-guaiFENesin (MUCINEX DM) 30-600 MG per 12 hr tablet 1 tablet, 1 tablet, Oral, BID PRN, Floydene Flock, MD   insulin aspart (novoLOG) injection 0-15 Units, 0-15 Units, Subcutaneous, TID WC, Wieting, Richard, MD, 5 Units at 10/10/23 1251   insulin aspart (novoLOG) injection 0-5 Units, 0-5 Units, Subcutaneous, QHS, Wieting, Richard, MD, 2 Units at 10/08/23 2241   insulin aspart (novoLOG) injection 4 Units, 4 Units, Subcutaneous, TID WC, Wieting, Richard, MD, 4 Units at 10/10/23 1251   insulin glargine-yfgn (SEMGLEE) injection 28 Units, 28 Units, Subcutaneous, QHS, Wieting, Richard, MD   ipratropium-albuterol (DUONEB) 0.5-2.5 (3) MG/3ML nebulizer solution 3 mL, 3  mL, Nebulization, Q4H PRN, Floydene Flock, MD   levothyroxine (SYNTHROID) tablet 100 mcg, 100 mcg, Oral, Q0600, Floydene Flock, MD, 100 mcg at 10/10/23 0601   lisinopril (ZESTRIL) tablet 20 mg, 20 mg, Oral, Daily, Wieting, Richard, MD   loratadine (CLARITIN) tablet 10 mg, 10 mg,  Oral, Daily, Floydene Flock, MD, 10 mg at 10/10/23 1914   methylPREDNISolone sodium succinate (SOLU-MEDROL) 1,000 mg in sodium chloride 0.9 % 50 mL IVPB, 1,000 mg, Intravenous, Q24H, Wieting, Richard, MD, Last Rate: 66 mL/hr at 10/09/23 1726, 1,000 mg at 10/09/23 1726   ondansetron (ZOFRAN) tablet 4 mg, 4 mg, Oral, Q6H PRN **OR** ondansetron (ZOFRAN) injection 4 mg, 4 mg, Intravenous, Q6H PRN, Floydene Flock, MD   Oral care mouth rinse, 15 mL, Mouth Rinse, PRN, Renae Gloss, Richard, MD   QUEtiapine (SEROQUEL) tablet 25 mg, 25 mg, Oral, QHS, Wieting, Richard, MD, 25 mg at 10/09/23 2153   sertraline (ZOLOFT) tablet 25 mg, 25 mg, Oral, Daily, Alford Highland, MD, 25 mg at 10/10/23 0924 Labs and Diagnostic Imaging   CBC:  Recent Labs  Lab 10/06/23 1433 10/09/23 0243  WBC 16.6* 6.5  HGB 15.3* 13.2  HCT 47.3* 40.6  MCV 90.1 88.5  PLT 357 217    Basic Metabolic Panel:  Lab Results  Component Value Date   NA 146 (H) 10/09/2023   K 3.9 10/09/2023   CO2 29 10/09/2023   GLUCOSE 320 (H) 10/09/2023   BUN 33 (H) 10/09/2023   CREATININE 0.98 10/09/2023   CALCIUM 9.0 10/09/2023   GFRNONAA >60 10/09/2023   GFRAA >60 08/19/2017   Lipid Panel: No results found for: "LDLCALC" HgbA1c:  Lab Results  Component Value Date   HGBA1C 7.1 (H) 06/07/2023   Urine Drug Screen:     Component Value Date/Time   LABOPIA NONE DETECTED 10/02/2023 0458   COCAINSCRNUR NONE DETECTED 10/02/2023 0458   LABBENZ NONE DETECTED 10/02/2023 0458   AMPHETMU NONE DETECTED 10/02/2023 0458   THCU NONE DETECTED 10/02/2023 0458   LABBARB NONE DETECTED 10/02/2023 0458    Alcohol Level No results found for: "ETH" INR  Lab Results  Component Value Date   INR 1.1 09/23/2023   APTT No results found for: "APTT" AED levels: No results found for: "PHENYTOIN", "ZONISAMIDE", "LAMOTRIGINE", "LEVETIRACETA"  MRI Brain 1. No acute intracranial abnormality. 2. Confluent hyperintense T2-weighted signal within the  superior bihemispheric white matter, most commonly indicating chronic microvascular ischemia.   CNS imaging personally reviewed, I agree with above interpretation   CTA chest 09/24/23 No evidence of pulmonary embolus.   Patchy bilateral ground-glass and nodular opacities, right greater than left. Appearance is concerning for pneumonia.   Enlarging spiculated nodule in the inferior right upper lobe now 1.6 cm compared to 1.3 cm previously. Recommend further evaluation with PET CT.  rEEG 12/6 showed mild diffuse slowing without epileptiform abnormalities  Assessment   A/P: This is a 67 year old woman with past medical history significant for COPD, chronic respiratory failure on 2 L of home O2, hypertension, type 2 diabetes, heart failure with preserved ejection fraction who presented with acute on chronic respiratory failure with hypoxia, COPD exacerbation, pneumonia, and UTI. Neurology was consulted for altered mental status. She does seem to have some acute delirium associated with the hospital stay and is hallucinating during our examination which is a new symptom for her. However I spoke to her son and daughter both at length who report a rapidly progressive decline from her baseline cognitive status approximately 6 months  ago. She is now unable to care for herself and PT is recommending SNF. Workup for reversible causes of dementia has thus far been unremarkable. She was found to have a spiculated nodule in the inferior right upper lobe which has now increased in size as of most recent CTA chest on November 22. Further evaluation has been recommended with the PET/CT. 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 Her LP results were bland with no pleiocytosis. Began solumedrol 12/8 for empiric tx of autoimmune encephalitis.   Recommendations   - F/u ENC2 autoimmune encephalopathy  [facility] - F/u ENS2 autoimmune encephalopathy panel serum send-out to [facility] clinic - 5 day course high-dose steroids 1g q 24 hrs end date 12/11. Anticipate discharge to SNF at that time so that she may undergo PET CT of her lung to facilitate malignancy workup (this is unable to be done inpatient). If this is paraneoplastic ultimately treatment of autoimmune encephalitis will depend on treatment of the malignancy, additionally we do not want to significantly delay malignancy workup by keeping her in the hospital - Neurology will continue to follow ______________________________________________________________________   Signed, Jefferson Fuel, MD [facility]

## 2023-10-10 NOTE — Progress Notes (Signed)
Neurology progress note  S: Patient is significantly improved, sitting up and eating dinner  Vitals:   10/09/23 1614 10/09/23 2336  BP: (!) 155/73 (!) 129/109  Pulse:    Resp: 16 18  Temp: 98.4 F (36.9 C) 98 F (36.7 C)  SpO2: 98%    Gen: patient lying in bed, NAD CV: extremities appear well-perfused Resp: normal WOB   Neurologic exam MS: alert, able to answer orientation questions  Speech: mild dysarthria, no aphasia CN: PERRL, VFF, EOMI, sensation intact, face symmetric, hearing intact to voice Motor: 4/5 strength diffusely formal strength exam 2/2 effort and AMS, bradykinetic but not rigid Sensory: SILT Reflexes: 2+ brisk symm with toes equiv bilat Coordination: FNF UTA 2/2 confusion Gait: deferred  MRI Brain 1. No acute intracranial abnormality. 2. Confluent hyperintense T2-weighted signal within the superior bihemispheric white matter, most commonly indicating chronic microvascular ischemia.   CNS imaging personally reviewed, I agree with above interpretation   CTA chest 09/24/23 No evidence of pulmonary embolus.   Patchy bilateral ground-glass and nodular opacities, right greater than left. Appearance is concerning for pneumonia.   Enlarging spiculated nodule in the inferior right upper lobe now 1.6 cm compared to 1.3 cm previously. Recommend further evaluation with PET CT.  A/P: This is a 67 year old woman with past medical history significant for COPD, chronic respiratory failure on 2 L of home O2, hypertension, type 2 diabetes, heart failure with preserved ejection fraction who presented with acute on chronic respiratory failure with hypoxia, COPD exacerbation, pneumonia, and UTI. Neurology was consulted for altered mental status. She does seem to have some acute delirium associated with the hospital stay and is hallucinating during our examination which is a new symptom for her. However I spoke to her son and daughter both at length who report a rapidly  progressive decline from her baseline cognitive status approximately 6 months ago. She is now unable to care for herself and PT is recommending SNF. Workup for reversible causes of dementia has thus far been unremarkable. She was found to have a spiculated nodule in the inferior right upper lobe which has now increased in size as of most recent CTA chest on November 22. Further evaluation has been recommended with the PET/CT. 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 Her LP results were bland with no pleiocytosis. Began solumedrol 12/8 for empiric tx of autoimmune encephalitis.  - F/u ENC2 autoimmune encephalopathy [facility] - Serum autoimmune encephalopathy panel ENS2 send-out to [facility] clinic - 5 day course high-dose steroids 1g q 24 hrs end date 12/11 - Neurology will continue to follow  Bing Neighbors, MD [facility] [phone]  If 7pm- 7am, please page neurology on call as listed in AMION.

## 2023-10-10 NOTE — Plan of Care (Signed)
Patient ID: Carmelina Paddock, female   DOB: 1956-09-08, 67 y.o.   MRN: [id number]  Problem: Education: Goal: Ability to describe self-care measures that may prevent or decrease complications (Diabetes Survival Skills Education) will improve Outcome: Progressing Goal: Individualized Educational Video(s) Outcome: Progressing   Problem: Coping: Goal: Ability to adjust to condition or change in health will improve Outcome: Progressing   Problem: Fluid Volume: Goal: Ability to maintain a balanced intake and output will improve Outcome: Progressing   Problem: Health Behavior/Discharge Planning: Goal: Ability to identify and utilize available resources and services will improve Outcome: Progressing Goal: Ability to manage health-related needs will improve Outcome: Progressing   Problem: Metabolic: Goal: Ability to maintain appropriate glucose levels will improve Outcome: Progressing   Problem: Nutritional: Goal: Maintenance of adequate nutrition will improve Outcome: Progressing Goal: Progress toward achieving an optimal weight will improve Outcome: Progressing   Problem: Skin Integrity: Goal: Risk for impaired skin integrity will decrease Outcome: Progressing   Problem: Tissue Perfusion: Goal: Adequacy of tissue perfusion will improve Outcome: Progressing   Problem: Education: Goal: Knowledge of General Education information will improve Description: Including pain rating scale, medication(s)/side effects and non-pharmacologic comfort measures Outcome: Progressing   Problem: Health Behavior/Discharge Planning: Goal: Ability to manage health-related needs will improve Outcome: Progressing   Problem: Clinical Measurements: Goal: Ability to maintain clinical measurements within normal limits will improve Outcome: Progressing Goal: Will remain free from infection Outcome: Progressing Goal: Diagnostic test results will improve Outcome: Progressing Goal: Respiratory  complications will improve Outcome: Progressing Goal: Cardiovascular complication will be avoided Outcome: Progressing   Problem: Activity: Goal: Risk for activity intolerance will decrease Outcome: Progressing   Problem: Nutrition: Goal: Adequate nutrition will be maintained Outcome: Progressing   Problem: Coping: Goal: Level of anxiety will decrease Outcome: Progressing   Problem: Elimination: Goal: Will not experience complications related to bowel motility Outcome: Progressing Goal: Will not experience complications related to urinary retention Outcome: Progressing   Problem: Pain Management: Goal: General experience of comfort will improve Outcome: Progressing   Problem: Safety: Goal: Ability to remain free from injury will improve Outcome: Progressing   Problem: Skin Integrity: Goal: Risk for impaired skin integrity will decrease Outcome: Progressing   Problem: Education: Goal: Knowledge of disease or condition will improve Outcome: Progressing Goal: Knowledge of the prescribed therapeutic regimen will improve Outcome: Progressing Goal: Individualized Educational Video(s) Outcome: Progressing   Problem: Activity: Goal: Ability to tolerate increased activity will improve Outcome: Progressing Goal: Will verbalize the importance of balancing activity with adequate rest periods Outcome: Progressing   Problem: Respiratory: Goal: Ability to maintain a clear airway will improve Outcome: Progressing Goal: Levels of oxygenation will improve Outcome: Progressing Goal: Ability to maintain adequate ventilation will improve Outcome: Progressing   Problem: Activity: Goal: Ability to tolerate increased activity will improve Outcome: Progressing   Problem: Clinical Measurements: Goal: Ability to maintain a body temperature in the normal range will improve Outcome: Progressing   Problem: Respiratory: Goal: Ability to maintain adequate ventilation will  improve Outcome: Progressing Goal: Ability to maintain a clear airway will improve Outcome: Progressing    Lidia Collum, RN

## 2023-10-10 NOTE — Plan of Care (Signed)

## 2023-10-10 NOTE — Progress Notes (Signed)
Progress Note   Patient: Michele House ZOX:[id number] DOB: 02-04-1956 DOA: 10/02/2023     8 DOS: the patient was seen and examined on 10/10/2023   Brief hospital course: Taken from H&P.  Michele House is a 67 y.o. female with medical history significant of COPD, chronic respiratory failure on 2 L, hypertension, type 2 diabetes, HFpEF presenting with acute on chronic respiratory failure with hypoxia, COPD exacerbation, pneumonia, UTI, encephalopathy.  Patient reports generalized malaise over the past 3 to 4 days.  Patient noted to have been recently admitted in the hospital November 21 through November 24 for similar issues including COPD exacerbation.  Patient reports not taking her medication since discharge.  Presented to the ER afebrile, heart rate 100s, BP stable.  Satting 96% on 3 L nasal cannula.  White count 23.4, hemoglobin 15, platelets 401, lactate 1.6, procalcitonin less than 0.1, troponin 18, urinalysis indicative of infection.  Urine drug screen negative.  Chest x-ray with a right sided pneumonia.  BNP within normal limits.  Creatinine 1.03.  Glucose 368.EKG sinus tachycardia.  Admitted for concern of pneumonia/COPD exacerbation and UTI. Started on Rocephin and azithromycin.  12/1: Vital stable, respiratory viral panel negative, procalcitonin negative, preliminary blood cultures negative.  Urine cultures are ordered as add-on, ammonia levels mildly elevated at 39, strep pneumo negative, CBG elevated at 244, history of enlarging pulmonary nodule with recommendations for PET/CT during most recent admission which has not been done yet, if video bronchoscopy was scheduled for 10/13/2023 as outpatient.  Unable to take care of himself and son cannot provide the required care as she required 24-hour supervision due to worsening dementia.  PT is recommending SNF.  12/2: Hemodynamically stable, now on baseline oxygen of 2 L.  Needs SNF placement. Urine cultures pending.  12/3: Blood  pressure started trending up, restarting home Zestoretic.  Urine cultures with strep agalactiae, penicillin allergy noted, will continue with ceftriaxone for now.  Also started on Remeron for concern of worsening depression and unable to sleep at night  12/4.  Some very concerned about patient's mental status not being seen.  Patient receiving antibiotics already and should have improved by now.  Will hold Lipitor.  Restart low-dose Zoloft.  Discontinue Remeron and do Seroquel at night. 12/5.  Patient has some blurry vision and weakness and pain.  MRI of the brain negative for acute intracranial abnormality. 12/6.  Patient stated she slept well.  Patient able to lift up her arms up off the bed today.  Patient thought she heard her son's voice outside the room. 12/7.  Patient sitting up in chair when I saw her.  Was able to feed herself breakfast.  Felt okay.  Did not offer any complaints. 12/8.  Patient more confused today than yesterday.  Able to follow some simple commands but not as good of a conversation today as yesterday.     Assessment and Plan: * Acute metabolic encephalopathy Patient's mental status worse today than yesterday.  Patient had a good day yesterday.  MRI of the brain negative for acute intracranial abnormality, chronic microvascular ischemic changes.  Discontinued Lipitor.  Patient has elevated vitamin B12, nonreactive RPR, TSH in the normal range.  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  Potentially has a rapidly progressive dementia versus limbic encephalitis versus autoimmune encephalitis versus leptomeningeal spread from spiculated nodule.  Lumbar puncture done 12/6 with testing sent to the [facility].  Started high-dose Solu-Medrol on 12/6.  Patient will receive 5 days  of high-dose Solu-Medrol (today will be day 3).  Uncontrolled type 2 diabetes mellitus with hypoglycemia, without long-term current use of insulin (HCC) With high-dose  Solu-Medrol needed to restart insulin.  Increase insulin to 28 units long-acting insulin plus short acting insulin and sliding scale prior to meals.  COPD exacerbation (HCC) Resolved  Acute on chronic respiratory failure with hypoxia (HCC) Patient initially required 3 L now back to 2 L.  Lung nodule 1.3 cm right upper lobe spiculated in nature.  Could be a primary lung cancerous process.  Will need outpatient PET scan and likely bronchoscopy for diagnosis.  Pneumonia Completed 5 days of Rocephin and Zithromax  UTI (urinary tract infection) Streptococcus agalactiae growing out of urine culture.  Completed Rocephin  (HFpEF) heart failure with preserved ejection fraction (HCC) Chronic in nature.  Not in acute exacerbation.  No signs of heart failure currently.  2D echo 09/2023 showed LVEF 55 to 60%, no regional wall motion abnormalities    Essential hypertension Restart lisinopril with blood pressure being higher now.  Type 2 diabetes mellitus with obesity (HCC) Sugars high now that she is on high-dose Solu-Medrol.  BMI 30.88        Subjective: Patient more confused today.  Patient able to follow some simple commands and answer some simple questions but not as talkative and conversational as yesterday.  Initially admitted with acute metabolic encephalopathy.  Physical Exam: Vitals:   10/09/23 0833 10/09/23 1614 10/09/23 2336 10/10/23 0803  BP: (!) 109/58 (!) 155/73 (!) 129/109 (!) 180/83  Pulse: 95   (!) 110  Resp: 17 16 18 17   Temp:  98.4 F (36.9 C) 98 F (36.7 C) (!) 97.3 F (36.3 C)  TempSrc:  Oral Oral Oral  SpO2: 100% 98%  97%  Weight:      Height:       Physical Exam HENT:     Head: Normocephalic.     Mouth/Throat:     Pharynx: No oropharyngeal exudate.  Eyes:     General: Lids are normal.     Conjunctiva/sclera: Conjunctivae normal.  Cardiovascular:     Rate and Rhythm: Normal rate and regular rhythm.     Heart sounds: Normal heart sounds, S1 normal and  S2 normal.  Pulmonary:     Breath sounds: No decreased breath sounds, wheezing, rhonchi or rales.  Abdominal:     Palpations: Abdomen is soft.     Tenderness: There is no abdominal tenderness.  Musculoskeletal:     Right lower leg: No swelling.     Left lower leg: No swelling.  Skin:    General: Skin is warm.     Findings: No rash.  Neurological:     Mental Status: She is alert. She is confused.     Comments: Less talkative today than yesterday.  Able to follow some simple commands and answer a few yes or no questions but more confused than yesterday.     Data Reviewed: Last 4 sugars 175, 192, 283 and 244  Family Communication: Spoke with son on the phone  Disposition: Status is: Inpatient Remains inpatient appropriate because: Still very confused today.  Day 3 of 5 of high-dose IV steroids today  Planned Discharge Destination: Rehab    Time spent: 28 minutes  Author: Alford Highland, MD 10/10/2023 1:29 PM  For on call review [email or web address].

## 2023-10-11 ENCOUNTER — Ambulatory Visit: Payer: 59

## 2023-10-11 ENCOUNTER — Inpatient Hospital Stay: Admit: 2023-10-11 | Payer: 59

## 2023-10-11 DIAGNOSIS — E870 Hyperosmolality and hypernatremia: Secondary | ICD-10-CM | POA: Diagnosis not present

## 2023-10-11 DIAGNOSIS — G9341 Metabolic encephalopathy: Secondary | ICD-10-CM | POA: Diagnosis not present

## 2023-10-11 DIAGNOSIS — I9589 Other hypotension: Secondary | ICD-10-CM | POA: Diagnosis not present

## 2023-10-11 DIAGNOSIS — E11649 Type 2 diabetes mellitus with hypoglycemia without coma: Secondary | ICD-10-CM | POA: Diagnosis not present

## 2023-10-11 DIAGNOSIS — I959 Hypotension, unspecified: Secondary | ICD-10-CM

## 2023-10-11 LAB — BLOOD GAS, ARTERIAL
Acid-Base Excess: 8 mmol/L — ABNORMAL HIGH (ref 0.0–2.0)
Bicarbonate: 32.8 mmol/L — ABNORMAL HIGH (ref 20.0–28.0)
FIO2: 0.28 %
O2 Saturation: 98.8 %
Patient temperature: 37
pCO2 arterial: 45 mm[Hg] (ref 32–48)
pH, Arterial: 7.47 — ABNORMAL HIGH (ref 7.35–7.45)
pO2, Arterial: 93 mm[Hg] (ref 83–108)

## 2023-10-11 LAB — CBC
HCT: 41.2 % (ref 36.0–46.0)
Hemoglobin: 13.1 g/dL (ref 12.0–15.0)
MCH: 28.7 pg (ref 26.0–34.0)
MCHC: 31.8 g/dL (ref 30.0–36.0)
MCV: 90.4 fL (ref 80.0–100.0)
Platelets: 223 10*3/uL (ref 150–400)
RBC: 4.56 MIL/uL (ref 3.87–5.11)
RDW: 14.3 % (ref 11.5–15.5)
WBC: 9.2 10*3/uL (ref 4.0–10.5)
nRBC: 0 % (ref 0.0–0.2)

## 2023-10-11 LAB — BASIC METABOLIC PANEL
Anion gap: 9 (ref 5–15)
BUN: 51 mg/dL — ABNORMAL HIGH (ref 8–23)
CO2: 29 mmol/L (ref 22–32)
Calcium: 9.1 mg/dL (ref 8.9–10.3)
Chloride: 109 mmol/L (ref 98–111)
Creatinine, Ser: 1.11 mg/dL — ABNORMAL HIGH (ref 0.44–1.00)
GFR, Estimated: 54 mL/min — ABNORMAL LOW (ref >60)
Glucose, Bld: 325 mg/dL — ABNORMAL HIGH (ref 70–99)
Potassium: 4 mmol/L (ref 3.5–5.1)
Sodium: 147 mmol/L — ABNORMAL HIGH (ref 135–145)

## 2023-10-11 LAB — GLUCOSE, CAPILLARY
Glucose-Capillary: 243 mg/dL — ABNORMAL HIGH (ref 70–99)
Glucose-Capillary: 280 mg/dL — ABNORMAL HIGH (ref 70–99)
Glucose-Capillary: 292 mg/dL — ABNORMAL HIGH (ref 70–99)
Glucose-Capillary: 305 mg/dL — ABNORMAL HIGH (ref 70–99)
Glucose-Capillary: 315 mg/dL — ABNORMAL HIGH (ref 70–99)
Glucose-Capillary: 374 mg/dL — ABNORMAL HIGH (ref 70–99)
Glucose-Capillary: 72 mg/dL (ref 70–99)

## 2023-10-11 LAB — CSF CULTURE W GRAM STAIN: Gram Stain: NONE SEEN

## 2023-10-11 LAB — VDRL, CSF: VDRL Quant, CSF: NONREACTIVE

## 2023-10-11 MED ORDER — QUETIAPINE FUMARATE 25 MG PO TABS
12.5000 mg | ORAL_TABLET | Freq: Every day | ORAL | Status: DC
  Administered 2023-10-11: 12.5 mg via ORAL
  Filled 2023-10-11: qty 1

## 2023-10-11 MED ORDER — INSULIN ASPART 100 UNIT/ML IJ SOLN
5.0000 [IU] | Freq: Three times a day (TID) | INTRAMUSCULAR | Status: DC
Start: 2023-10-11 — End: 2023-10-12
  Administered 2023-10-11 (×2): 5 [IU] via SUBCUTANEOUS
  Filled 2023-10-11 (×2): qty 1

## 2023-10-11 MED ORDER — SODIUM CHLORIDE 0.9 % IV BOLUS
500.0000 mL | Freq: Once | INTRAVENOUS | Status: AC
  Administered 2023-10-11: 500 mL via INTRAVENOUS

## 2023-10-11 MED ORDER — SODIUM CHLORIDE 0.9 % IV SOLN: INTRAVENOUS | Status: DC

## 2023-10-11 MED ORDER — SODIUM CHLORIDE 0.45 % IV SOLN: INTRAVENOUS | Status: AC

## 2023-10-11 NOTE — Progress Notes (Signed)
Physical Therapy Treatment Patient Details Name: Michele House MRN: [id number] DOB: 08-May-1956 Today's Date: 10/11/2023   History of Present Illness Pt is a 67 y/o F admitted on 10/02/23 after presenting with acute on chronic respiratory failure with hypoxia, COPD exacerbation, PNA, UTI, & encephalopathy. PMH: COPD, chronic respiratory failure on 2L,HTN, DM2, HFpEF    PT Comments  Pt resting in bed upon PT arrival; pt just finishing eating with staff assistance.  Pt was able to talk with therapist a bit during session (few words at a time) but appearing confused in general; inconsistent and requiring increased time to follow 1 step cues. During session pt max assist supine to sitting EOB but pt unable to assist with her sitting balance much (initially did not assist at all (requiring total assist) but then used her L UE to assist with sitting balance but pt was then leaning to R side (requiring max assist)).  Pt assisted back to bed end of session with 2 assist. Therapist saw pt move her L UE and L LE multiple times during the session but did not see any trace of movement on R side (UE or LE); increased tone noted R ankle. Unsure if cognition playing a role in pt not moving R side--MD and pt's nurse notified regarding concerns.    If plan is discharge home, recommend the following: Assistance with cooking/housework;Supervision due to cognitive status;Direct supervision/assist for financial management;Help with stairs or ramp for entrance;Assist for transportation;Direct supervision/assist for medications management;Assistance with feeding;Two people to help with walking and/or transfers;A lot of help with bathing/dressing/bathroom   Can travel by private vehicle     No  Equipment Recommendations  Rolling walker (2 wheels);BSC/3in1;Wheelchair (measurements PT);Wheelchair cushion (measurements PT);Hoyer lift    Recommendations for [employer]       Precautions / Restrictions  Precautions Precautions: Fall Restrictions Weight Bearing Restrictions: No     Mobility  Bed Mobility Overal bed mobility: Needs Assistance Bed Mobility: Supine to Sit, Sit to Supine     Supine to sit: Max assist, HOB elevated Sit to supine: Max assist, +2 for physical assistance   General bed mobility comments: assist for trunk and B LE's; vc's for technique    Transfers                   General transfer comment: unable to maintain sitting balance without significant assist so deferred transfer attempt    Ambulation/Gait                   Stairs             Wheelchair Mobility     Tilt Bed    Modified Rankin (Stroke Patients Only)       Balance Overall balance assessment: Needs assistance Sitting-balance support: Single extremity supported, Feet supported Sitting balance-Leahy Scale: Zero Sitting balance - Comments: initially total assist but improved to max assist when pt utilizing L UE to assist (pt then leaning to R side)                                    Cognition Arousal: Alert Behavior During Therapy: Flat affect Overall Cognitive Status: No family/caregiver present to determine baseline cognitive functioning                                 General  Comments: Pt oriented to person only; increased time to follow 1 step cues (inconsistent with following 1 step cues)        Exercises      General Comments  Nursing cleared pt for participation in physical therapy.  Pt agreeable to PT session.      Pertinent Vitals/Pain Pain Assessment Pain Assessment: Faces Faces Pain Scale: No hurt Pain Intervention(s): Limited activity within patient's tolerance, Monitored during session, Repositioned Vitals (HR and SpO2 on room air) stable and WFL throughout treatment session.    Home Living                          Prior Function            PT Goals (current goals can now be found in the  care plan section) Acute Rehab PT Goals Patient Stated Goal: none stated PT Goal Formulation: With patient Time For Goal Achievement: 10/17/23 Potential to Achieve Goals: Fair Progress towards PT goals: Progressing toward goals    Frequency    Min 1X/week      PT Plan      Co-evaluation              AM-PAC PT "6 Clicks" Mobility   Outcome Measure  Help needed turning from your back to your side while in a flat bed without using bedrails?: A Lot Help needed moving from lying on your back to sitting on the side of a flat bed without using bedrails?: A Lot Help needed moving to and from a bed to a chair (including a wheelchair)?: Total Help needed standing up from a chair using your arms (e.g., wheelchair or bedside chair)?: Total Help needed to walk in hospital room?: Total Help needed climbing 3-5 steps with a railing? : Total 6 Click Score: 8    End of Session Equipment Utilized During Treatment: Oxygen Activity Tolerance: Patient limited by fatigue Patient left: in bed;with call bell/phone within reach;with bed alarm set;Other (comment) (B heels floating via pillow support; R UE supported via pillow) Nurse Communication: Mobility status;Precautions;Other (comment) (pt not moving R UE/LE during session) PT Visit Diagnosis: Unsteadiness on feet (R26.81);Other abnormalities of gait and mobility (R26.89);Muscle weakness (generalized) (M62.81);Difficulty in walking, not elsewhere classified (R26.2)     Time: 1610-9604 PT Time Calculation (min) (ACUTE ONLY): 23 min  Charges:    $Therapeutic Activity: 23-37 mins PT General Charges $$ ACUTE PT VISIT: 1 Visit                     Hendricks Limes, PT 10/11/23, 4:07 PM

## 2023-10-11 NOTE — Procedures (Signed)
Pt is too unstable now (low bP) and nurse wants to change IV before eeg is done.Will attempt later.

## 2023-10-11 NOTE — Progress Notes (Signed)
Progress Note   Patient: Michele House EXB:[id number] DOB: January 12, 1956 DOA: 10/02/2023     9 DOS: the patient was seen and examined on 10/11/2023   Brief hospital course: Taken from H&P.  Michele House is a 67 y.o. female with medical history significant of COPD, chronic respiratory failure on 2 L, hypertension, type 2 diabetes, HFpEF presenting with acute on chronic respiratory failure with hypoxia, COPD exacerbation, pneumonia, UTI, encephalopathy.  Patient reports generalized malaise over the past 3 to 4 days.  Patient noted to have been recently admitted in the hospital November 21 through November 24 for similar issues including COPD exacerbation.  Patient reports not taking her medication since discharge.  Presented to the ER afebrile, heart rate 100s, BP stable.  Satting 96% on 3 L nasal cannula.  White count 23.4, hemoglobin 15, platelets 401, lactate 1.6, procalcitonin less than 0.1, troponin 18, urinalysis indicative of infection.  Urine drug screen negative.  Chest x-ray with a right sided pneumonia.  BNP within normal limits.  Creatinine 1.03.  Glucose 368.EKG sinus tachycardia.  Admitted for concern of pneumonia/COPD exacerbation and UTI. Started on Rocephin and azithromycin.  12/1: Vital stable, respiratory viral panel negative, procalcitonin negative, preliminary blood cultures negative.  Urine cultures are ordered as add-on, ammonia levels mildly elevated at 39, strep pneumo negative, CBG elevated at 244, history of enlarging pulmonary nodule with recommendations for PET/CT during most recent admission which has not been done yet, if video bronchoscopy was scheduled for 10/13/2023 as outpatient.  Unable to take care of himself and son cannot provide the required care as she required 24-hour supervision due to worsening dementia.  PT is recommending SNF.  12/2: Hemodynamically stable, now on baseline oxygen of 2 L.  Needs SNF placement. Urine cultures pending.  12/3: Blood  pressure started trending up, restarting home Zestoretic.  Urine cultures with strep agalactiae, penicillin allergy noted, will continue with ceftriaxone for now.  Also started on Remeron for concern of worsening depression and unable to sleep at night  12/4.  Some very concerned about patient's mental status not being seen.  Patient receiving antibiotics already and should have improved by now.  Will hold Lipitor.  Restart low-dose Zoloft.  Discontinue Remeron and do Seroquel at night. 12/5.  Patient has some blurry vision and weakness and pain.  MRI of the brain negative for acute intracranial abnormality. 12/6.  Patient stated she slept well.  Patient able to lift up her arms up off the bed today.  Patient thought she heard her son's voice outside the room. 12/7.  Patient sitting up in chair when I saw her.  Was able to feed herself breakfast.  Felt okay.  Did not offer any complaints. 12/8.  Patient more confused today than yesterday.  Able to follow some simple commands but not as good of a conversation today as yesterday. 12/9.  Patient still confused today.  Does not follow simple commands today.     Assessment and Plan: * Acute metabolic encephalopathy Patient's mental status is worse today than 2 days ago.  MRI of the brain negative for acute intracranial abnormality, chronic microvascular ischemic changes.  Discontinued Lipitor.  Patient has elevated vitamin B12, nonreactive RPR, TSH in the normal range.  Potentially has a rapidly progressive dementia versus limbic encephalitis versus autoimmune encephalitis versus leptomeningeal spread from spiculated nodule.  Lumbar puncture done 12/6 with testing sent to the [facility].  Started high-dose Solu-Medrol on 12/6.  Patient will receive 5 days of high-dose  Solu-Medrol (today will be day 4).  Repeat ABG does not show CO2 retention.  Repeat EEG today.  Uncontrolled type 2 diabetes mellitus with hypoglycemia, without long-term current use of  insulin (HCC) With high-dose Solu-Medrol needed to restart insulin.  Continue insulin to 28 units long-acting insulin plus short acting insulin and sliding scale prior to meals.  Hypotension Fluid bolus today and will continue half-normal saline.  Hypernatremia Fluid bolus today and will continue half-normal saline.  Acute on chronic respiratory failure with hypoxia (HCC) Patient initially required 3 L now on 1 L.  Lung nodule 1.3 cm right upper lobe spiculated in nature.  Could be a primary lung cancerous process.  Will need outpatient PET scan and likely bronchoscopy for diagnosis.  Spoke with Dr. Karna Christmas today and she has missed a few outpatient appointments.  COPD exacerbation (HCC) Resolved  Pneumonia Completed 5 days of Rocephin and Zithromax  UTI (urinary tract infection) Streptococcus agalactiae growing out of urine culture.  Completed Rocephin  (HFpEF) heart failure with preserved ejection fraction (HCC) Chronic in nature.  Not in acute exacerbation.  No signs of heart failure currently.  2D echo 09/2023 showed LVEF 55 to 60%, no regional wall motion abnormalities    Essential hypertension Holding antihypertensive medications with hypotension.  Type 2 diabetes mellitus with obesity (HCC) Sugars high now that she is on high-dose Solu-Medrol.  BMI 30.88        Subjective: Patient more confused today.  Was lying there trying to answer questions but was unable to lift up her arms off the bed to command.  We did see her move her arms and legs on her own.  Physical Exam: Vitals:   10/11/23 0821 10/11/23 1033 10/11/23 1221 10/11/23 1418  BP: 98/63 (!) 81/57 107/62 99/60  Pulse: 95 96 91 83  Resp: 15 20 16    Temp: (P) 97.6 F (36.4 C) 97.7 F (36.5 C) 98.2 F (36.8 C)   TempSrc:  Oral Oral   SpO2: 99% 95% 94% 96%  Weight:      Height:       Physical Exam HENT:     Head: Normocephalic.     Mouth/Throat:     Comments: Did not open mouth for me  today. Eyes:     General: Lids are normal.     Conjunctiva/sclera: Conjunctivae normal.  Cardiovascular:     Rate and Rhythm: Normal rate and regular rhythm.     Heart sounds: Normal heart sounds, S1 normal and S2 normal.  Pulmonary:     Breath sounds: No decreased breath sounds, wheezing, rhonchi or rales.  Abdominal:     Palpations: Abdomen is soft.     Tenderness: There is no abdominal tenderness.  Musculoskeletal:     Right lower leg: No swelling.     Left lower leg: No swelling.  Skin:    General: Skin is warm.     Findings: No rash.  Neurological:     Mental Status: She is confused.     Comments: Moves her arms and legs on her own but not to command.     Data Reviewed: Creatinine 1.11 with a GFR 54, sodium 147, white blood count 9.2, hemoglobin 13.1, platelet count 223  Family Communication: Updated son on the phone  Disposition: Status is: Inpatient Remains inpatient appropriate because: Mental status still very impaired.  Neurology wants to continue 5 days of steroids (today will be day 4)  Planned Discharge Destination: Rehab    Time spent: 53  minutes Case discussed with neurology  Author: Alford Highland, MD 10/11/2023 2:47 PM  For on call review [email or web address].

## 2023-10-11 NOTE — Assessment & Plan Note (Signed)
Improved with fluid bolus.  Holding antihypertensive medications

## 2023-10-11 NOTE — Progress Notes (Signed)
NEUROLOGY CONSULT FOLLOW UP NOTE   Date of service: October 11, 2023 Patient Name: Michele House MRN:  [id number] DOB:  Dec 05, 1955  Brief HPI   This is a 67 year old woman with past medical history significant for COPD, chronic respiratory failure on 2 L of home O2, hypertension, type 2 diabetes, heart failure with preserved ejection fraction who presented with acute on chronic respiratory failure with hypoxia, COPD exacerbation, pneumonia, and UTI.  Neurology was consulted for altered mental status however upon further discussions with her family her clinical presentation is more consistent with rapidly progressive dementia with patient at baseline 6 months ago now requiring 24-hour supervision and assistance with her ADLs.  She has a spiculated nodule in in the right upper lobe which is increased in size and most recent CTA chest performed 2 weeks ago.  Given what appears to be a rapidly progressive dementia in the setting of possible pulmonary malignancy, patient is currently undergoing 5-day treatment with high-dose steroids for empiric treatment of autoimmune encephalitis while awaiting autoantibody panels to result.   Interval Hx/subjective  Patient seen and examined. Laying in bed gazing towards the ceiling.  She did tell me her name but only after multiple attempts.  Supplemental oxygen tubing on the nares.  No hallucinations reported today  Vitals   Vitals:   10/10/23 0803 10/10/23 1442 10/11/23 0000 10/11/23 0821  BP: (!) 180/83 133/83 133/72 98/63  Pulse: (!) 110 (!) 110 (!) 104 95  Resp: 17 16 16 15   Temp: (!) 97.3 F (36.3 C) 98.2 F (36.8 C) 97.8 F (36.6 C) (P) 97.6 F (36.4 C)  TempSrc: Oral Oral    SpO2: 97% 97% 99% 99%  Weight:      Height:         Body mass index is 30.88 kg/m.  Physical Exam   General: Well-developed well-nourished: Slightly unkempt comfortably laying in bed HEENT: Normocephalic atraumatic Lungs: Scattered rales Cardiovascular: Regular  rate rhythm Abdomen nondistended nontender Neurological exam She is awake, alert, oriented to self and the fact that she is in the hospital.  Unable to tell me the correct month.  Speech is mildly dysarthric.  No gross aphasia but she has poor attention concentration and loses track of conversations very quickly. Cranial nerves: Pupils are equal round reactive light, extraocular movements appear unhindered although she prefers to just be gazing straight up on the ceiling for most part and did not follow commands to look either direction with spontaneously would look to one side or the other.  Blinks to threat from both sides.  Face appears symmetric. Motor examination: She did not move any of the extremities to command.  On passively lifting her arms and legs up, she let them fall to the bed without any effort.  Almost appears she has diminished tone.  Then later during the exam, she was spontaneously able to move her legs to reposition herself. Sensation: Grimace and mild withdrawal to noxious stimulation in all fours. Coordination difficult to assess given her current mentation. Gait testing deferred for patient's safety.  Medications  Current Facility-Administered Medications:    acetaminophen (TYLENOL) tablet 650 mg, 650 mg, Oral, Q6H PRN, Floydene Flock, MD, 650 mg at 10/10/23 0601   dextromethorphan-guaiFENesin (MUCINEX DM) 30-600 MG per 12 hr tablet 1 tablet, 1 tablet, Oral, BID PRN, Floydene Flock, MD   insulin aspart (novoLOG) injection 0-15 Units, 0-15 Units, Subcutaneous, TID WC, Alford Highland, MD, 8 Units at 10/11/23 0807   insulin aspart (novoLOG)  injection 0-5 Units, 0-5 Units, Subcutaneous, QHS, Wieting, Richard, MD, 2 Units at 10/08/23 2241   insulin aspart (novoLOG) injection 5 Units, 5 Units, Subcutaneous, TID WC, Alford Highland, MD, 5 Units at 10/11/23 0807   insulin glargine-yfgn (SEMGLEE) injection 28 Units, 28 Units, Subcutaneous, QHS, Wieting, Richard, MD, 28 Units  at 10/10/23 2116   ipratropium-albuterol (DUONEB) 0.5-2.5 (3) MG/3ML nebulizer solution 3 mL, 3 mL, Nebulization, Q4H PRN, Floydene Flock, MD   levothyroxine (SYNTHROID) tablet 100 mcg, 100 mcg, Oral, Q0600, Floydene Flock, MD, 100 mcg at 10/10/23 0601   lisinopril (ZESTRIL) tablet 20 mg, 20 mg, Oral, Daily, Wieting, Richard, MD, 20 mg at 10/10/23 1909   loratadine (CLARITIN) tablet 10 mg, 10 mg, Oral, Daily, Floydene Flock, MD, 10 mg at 10/10/23 0924   methylPREDNISolone sodium succinate (SOLU-MEDROL) 1,000 mg in sodium chloride 0.9 % 50 mL IVPB, 1,000 mg, Intravenous, Q24H, Wieting, Richard, MD, Last Rate: 66 mL/hr at 10/10/23 2043, 1,000 mg at 10/10/23 2043   ondansetron (ZOFRAN) tablet 4 mg, 4 mg, Oral, Q6H PRN **OR** ondansetron (ZOFRAN) injection 4 mg, 4 mg, Intravenous, Q6H PRN, Floydene Flock, MD   Oral care mouth rinse, 15 mL, Mouth Rinse, PRN, Renae Gloss, Richard, MD   QUEtiapine (SEROQUEL) tablet 25 mg, 25 mg, Oral, QHS, Wieting, Richard, MD, 25 mg at 10/10/23 2047   sertraline (ZOLOFT) tablet 25 mg, 25 mg, Oral, Daily, Renae Gloss, Richard, MD, 25 mg at 10/10/23 0924 Labs and Diagnostic Imaging   CBC:  Recent Labs  Lab 10/09/23 0243 10/11/23 0432  WBC 6.5 9.2  HGB 13.2 13.1  HCT 40.6 41.2  MCV 88.5 90.4  PLT 217 223    Basic Metabolic Panel:  Lab Results  Component Value Date   NA 147 (H) 10/11/2023   K 4.0 10/11/2023   CO2 29 10/11/2023   GLUCOSE 325 (H) 10/11/2023   BUN 51 (H) 10/11/2023   CREATININE 1.11 (H) 10/11/2023   CALCIUM 9.1 10/11/2023   GFRNONAA 54 (L) 10/11/2023   GFRAA >60 08/19/2017   MRI Brain 1. No acute intracranial abnormality. 2. Confluent hyperintense T2-weighted signal within the superior bihemispheric white matter, most commonly indicating chronic microvascular ischemia.   CNS imaging personally reviewed, I agree with above interpretation   CTA chest 09/24/23 No evidence of pulmonary embolus.   Patchy bilateral ground-glass and  nodular opacities, right greater than left. Appearance is concerning for pneumonia.   Enlarging spiculated nodule in the inferior right upper lobe now 1.6 cm compared to 1.3 cm previously. Recommend further evaluation with PET CT.  rEEG 12/6 showed mild diffuse slowing without epileptiform abnormalities  Assessment   A/P: This is a 67 year old woman with past medical history significant for COPD, chronic respiratory failure on 2 L of home O2, hypertension, type 2 diabetes, heart failure with preserved ejection fraction who presented with acute on chronic respiratory failure with hypoxia, COPD exacerbation, pneumonia, and UTI. Neurology was consulted for altered mental status.   She does seem to have some acute delirium associated with the hospital stay and was hallucinating during examination couple days ago, which is a new symptom for her.   Dr. Selina Cooley spoke to her son and daughter both at length who report a rapidly progressive decline from her baseline cognitive status approximately 6 months ago. She is now unable to care for herself and PT is recommending SNF.   Workup for reversible causes of dementia has thus far been unremarkable. She was found to have a spiculated nodule in  the inferior right upper lobe which has now increased in size as of most recent CTA chest on November 22. Further evaluation has been recommended with the PET/CT. 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   Her LP results were bland with no pleiocytosis.   Began solumedrol 12/8 for empiric tx of autoimmune encephalitis.   Mental status remains waxing and waning-question if there is any underlying electrographic abnormality not caught on routine EEG versus contribution from hypoxia/hypercarbia given chronic COPD.  Recommendations   - F/u ENC2 autoimmune encephalopathy [facility] - F/u  ENS2 autoimmune encephalopathy panel serum send-out to [facility] - 5 day course high-dose steroids 1g q 24 hrs end date 12/11. Anticipate discharge to SNF at that time so that she may undergo PET CT of her lung to facilitate malignancy workup (this is unable to be done inpatient). If this is paraneoplastic ultimately treatment of autoimmune encephalitis will depend on treatment of the malignancy, additionally we do not want to significantly delay malignancy workup by keeping her in the hospital -Check arterial blood gas -Check another routine EEG.  If there is any concern for any focality, may need transfer to [facility] for long-term EEG since a good cause of altered mental status still remains elusive.  - Neurology will continue to follow  Plan discussed with Dr. Renae Gloss   -- Milon Dikes, MD Neurologist [facility]

## 2023-10-11 NOTE — Progress Notes (Signed)
Eeg done 

## 2023-10-11 NOTE — Plan of Care (Signed)
°  Problem: Education: °Goal: Knowledge of General Education information will improve °Description: Including pain rating scale, medication(s)/side effects and non-pharmacologic comfort measures °Outcome: Progressing °  °Problem: Nutrition: °Goal: Adequate nutrition will be maintained °Outcome: Progressing °  °Problem: Skin Integrity: °Goal: Risk for impaired skin integrity will decrease °Outcome: Progressing °  °

## 2023-10-11 NOTE — Assessment & Plan Note (Signed)
Sodium 146 yesterday. -Continue to monitor

## 2023-10-12 DIAGNOSIS — R4182 Altered mental status, unspecified: Secondary | ICD-10-CM | POA: Diagnosis not present

## 2023-10-12 DIAGNOSIS — R569 Unspecified convulsions: Secondary | ICD-10-CM

## 2023-10-12 DIAGNOSIS — E870 Hyperosmolality and hypernatremia: Secondary | ICD-10-CM | POA: Diagnosis not present

## 2023-10-12 DIAGNOSIS — E11649 Type 2 diabetes mellitus with hypoglycemia without coma: Secondary | ICD-10-CM | POA: Diagnosis not present

## 2023-10-12 DIAGNOSIS — G9341 Metabolic encephalopathy: Secondary | ICD-10-CM | POA: Diagnosis not present

## 2023-10-12 DIAGNOSIS — I9589 Other hypotension: Secondary | ICD-10-CM | POA: Diagnosis not present

## 2023-10-12 LAB — BASIC METABOLIC PANEL
Anion gap: 5 (ref 5–15)
BUN: 56 mg/dL — ABNORMAL HIGH (ref 8–23)
CO2: 31 mmol/L (ref 22–32)
Calcium: 8.7 mg/dL — ABNORMAL LOW (ref 8.9–10.3)
Chloride: 110 mmol/L (ref 98–111)
Creatinine, Ser: 1.03 mg/dL — ABNORMAL HIGH (ref 0.44–1.00)
GFR, Estimated: 59 mL/min — ABNORMAL LOW (ref >60)
Glucose, Bld: 251 mg/dL — ABNORMAL HIGH (ref 70–99)
Potassium: 3.8 mmol/L (ref 3.5–5.1)
Sodium: 146 mmol/L — ABNORMAL HIGH (ref 135–145)

## 2023-10-12 LAB — GLUCOSE, CAPILLARY
Glucose-Capillary: 228 mg/dL — ABNORMAL HIGH (ref 70–99)
Glucose-Capillary: 237 mg/dL — ABNORMAL HIGH (ref 70–99)
Glucose-Capillary: 307 mg/dL — ABNORMAL HIGH (ref 70–99)
Glucose-Capillary: 291 mg/dL — ABNORMAL HIGH (ref 70–99)
Glucose-Capillary: 183 mg/dL — ABNORMAL HIGH (ref 70–99)
Glucose-Capillary: 161 mg/dL — ABNORMAL HIGH (ref 70–99)
Glucose-Capillary: 143 mg/dL — ABNORMAL HIGH (ref 70–99)

## 2023-10-12 LAB — ANA W/REFLEX IF POSITIVE: Anti Nuclear Antibody (ANA): NEGATIVE

## 2023-10-12 MED ORDER — QUETIAPINE FUMARATE 25 MG PO TABS
12.5000 mg | ORAL_TABLET | Freq: Every evening | ORAL | Status: DC | PRN
  Administered 2023-10-13 – 2023-10-27 (×7): 12.5 mg via ORAL
  Filled 2023-10-12 (×7): qty 1

## 2023-10-12 MED ORDER — INSULIN GLARGINE-YFGN 100 UNIT/ML ~~LOC~~ SOLN
26.0000 [IU] | Freq: Every day | SUBCUTANEOUS | Status: AC
  Administered 2023-10-12: 26 [IU] via SUBCUTANEOUS
  Filled 2023-10-12: qty 0.26

## 2023-10-12 MED ORDER — INSULIN GLARGINE-YFGN 100 UNIT/ML ~~LOC~~ SOLN
10.0000 [IU] | Freq: Every day | SUBCUTANEOUS | Status: DC
  Filled 2023-10-12: qty 0.1

## 2023-10-12 MED ORDER — ENOXAPARIN SODIUM 40 MG/0.4ML IJ SOSY
40.0000 mg | PREFILLED_SYRINGE | Freq: Every day | INTRAMUSCULAR | Status: DC
Start: 2023-10-12 — End: 2023-10-28
  Administered 2023-10-12 – 2023-10-28 (×16): 40 mg via SUBCUTANEOUS
  Filled 2023-10-12 (×16): qty 0.4

## 2023-10-12 MED ORDER — INSULIN GLARGINE-YFGN 100 UNIT/ML ~~LOC~~ SOLN
22.0000 [IU] | Freq: Every day | SUBCUTANEOUS | Status: DC
Start: 2023-10-12 — End: 2023-10-12
  Filled 2023-10-12: qty 0.22

## 2023-10-12 NOTE — Progress Notes (Signed)
Physical Therapy Treatment Patient Details Name: Michele House MRN: [id number] DOB: 06/30/1956 Today's Date: 10/12/2023   History of Present Illness Pt is a 67 y/o F admitted on 10/02/23 after presenting with acute on chronic respiratory failure with hypoxia, COPD exacerbation, PNA, UTI, & encephalopathy. PMH: COPD, chronic respiratory failure on 2L,HTN, DM2, HFpEF    PT Comments  PT/OT co-session performed.  Pt talking intermittently during session but appearing confused.  During session pt max assist x2 with bed mobility; max to total assist for sitting balance (R/posterior lean noted); and pt unable to initiate to attempt standing (utilized pt's own rollator to support pt's cognition/recognition for task).  Pt assisted back to bed end of session with R UE elevated on pillow.  Pt moving L UE/LE at times during session (not to cues though).  Pt did NOT move R UE/R LE throughout session--MD Renae Gloss and MD Wilford Corner updated on concerns.    If plan is discharge home, recommend the following: Assistance with cooking/housework;Supervision due to cognitive status;Direct supervision/assist for financial management;Help with stairs or ramp for entrance;Assist for transportation;Direct supervision/assist for medications management;Assistance with feeding;Two people to help with walking and/or transfers;A lot of help with bathing/dressing/bathroom   Can travel by private vehicle     No  Equipment Recommendations  Rolling walker (2 wheels);BSC/3in1;Wheelchair (measurements PT);Wheelchair cushion (measurements PT);Hoyer lift    Recommendations for [employer]       Precautions / Restrictions Precautions Precautions: Fall Restrictions Weight Bearing Restrictions: No     Mobility  Bed Mobility Overal bed mobility: Needs Assistance Bed Mobility: Supine to Sit, Sit to Supine     Supine to sit: Max assist, +2 for physical assistance Sit to supine: Max assist, +2 for physical assistance    General bed mobility comments: assist for trunk and B LE's; vc's for technique    Transfers Overall transfer level: Needs assistance Equipment used: Rollator (4 wheels) Transfers: Sit to/from Stand             General transfer comment: Attempted sit to stand from EOB with pt's person 4ww (to support cognitive sequencing of task); pt unable to initiate any lift off from seating surface    Ambulation/Gait                   Stairs             Wheelchair Mobility     Tilt Bed    Modified Rankin (Stroke Patients Only)       Balance Overall balance assessment: Needs assistance Sitting-balance support: Feet supported, Single extremity supported Sitting balance-Leahy Scale: Zero Sitting balance - Comments: Max to total assist for static sitting at EOB (tending to lean to R/backwards)                                    Cognition Arousal: Alert Behavior During Therapy: Flat affect Overall Cognitive Status: No family/caregiver present to determine baseline cognitive functioning                                          Exercises      General Comments General comments (skin integrity, edema, etc.): Pt on 2L [place] t/o session spO2 remains WNL (97-98%) HR noted to be in upper 90's at rest.  Nursing cleared pt for participation in  physical therapy.  Pt agreeable to PT/OT session.      Pertinent Vitals/Pain Pain Assessment Pain Assessment: Faces Faces Pain Scale: No hurt Pain Intervention(s): Limited activity within patient's tolerance, Monitored during session    Home Living                          Prior Function            PT Goals (current goals can now be found in the care plan section) Acute Rehab PT Goals Patient Stated Goal: none stated PT Goal Formulation: With patient Time For Goal Achievement: 10/17/23 Potential to Achieve Goals: Poor Progress towards PT goals: Not progressing toward goals -  comment (no improvement noted from yesterday's therapy session)    Frequency    Min 1X/week      PT Plan      Co-evaluation PT/OT/SLP Co-Evaluation/Treatment: Yes Reason for Co-Treatment: Complexity of the patient's impairments (multi-system involvement);Necessary to address cognition/behavior during functional activity;For patient/therapist safety;To address functional/ADL transfers PT goals addressed during session: Mobility/safety with mobility;Balance OT goals addressed during session: ADL's and self-care;Proper use of Adaptive equipment and DME      AM-PAC PT "6 Clicks" Mobility   Outcome Measure  Help needed turning from your back to your side while in a flat bed without using bedrails?: A Lot Help needed moving from lying on your back to sitting on the side of a flat bed without using bedrails?: A Lot Help needed moving to and from a bed to a chair (including a wheelchair)?: Total Help needed standing up from a chair using your arms (e.g., wheelchair or bedside chair)?: Total Help needed to walk in hospital room?: Total Help needed climbing 3-5 steps with a railing? : Total 6 Click Score: 8    End of Session Equipment Utilized During Treatment: Oxygen Activity Tolerance: Patient limited by fatigue Patient left: in bed;with call bell/phone within reach;with bed alarm set;Other (comment);with SCD's reapplied (B heels floating via pillow support) Nurse Communication: Mobility status;Precautions;Other (comment) (pt not moving R UE/LE during session) PT Visit Diagnosis: Unsteadiness on feet (R26.81);Other abnormalities of gait and mobility (R26.89);Muscle weakness (generalized) (M62.81);Difficulty in walking, not elsewhere classified (R26.2)     Time: 9528-4132 PT Time Calculation (min) (ACUTE ONLY): 23 min  Charges:    $Therapeutic Activity: 8-22 mins PT General Charges $$ ACUTE PT VISIT: 1 Visit                     Hendricks Limes, PT 10/12/23, 5:34 PM

## 2023-10-12 NOTE — Plan of Care (Signed)

## 2023-10-12 NOTE — Procedures (Signed)
Patient Name: Michele House  MRN: [id number]  Epilepsy Attending: Charlsie Quest  Referring Physician/Provider: Dr Milon Dikes Date: 10/11/2023  Duration: 34.22 mins  Patient history: 67yo F with ams getting eeg to evaluate for seizure  Level of alertness: Awake  AEDs during EEG study: None  Technical aspects: This EEG study was done with scalp electrodes positioned according to the 10-20 International system of electrode placement. Electrical activity was reviewed with band pass filter of 1-70Hz , sensitivity of 7 uV/mm, display speed of 3mm/sec with a 60Hz  notched filter applied as appropriate. EEG data were recorded continuously and digitally stored.  Video monitoring was available and reviewed as appropriate.  Description: No clear posterior dominant rhythm was seen. EEG showed continuous generalized predominantly 7 to 6 Hz theta slowing. Hyperventilation and photic stimulation were not performed.     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 No seizures or epileptiform discharges were seen throughout the recording  Lankford Gutzmer Annabelle Harman

## 2023-10-12 NOTE — Progress Notes (Signed)
Progress Note   Patient: Michele House NWG:[id number] DOB: [birth year] DOA: 10/02/2023     10 DOS: the patient was seen and examined on 10/12/2023   Brief hospital course: Taken from H&P.  CLARKIE CRUSON is a 67 y.o. female with medical history significant of COPD, chronic respiratory failure on 2 L, hypertension, type 2 diabetes, HFpEF presenting with acute on chronic respiratory failure with hypoxia, COPD exacerbation, pneumonia, UTI, encephalopathy.  Patient reports generalized malaise over the past 3 to 4 days.  Patient noted to have been recently admitted in the hospital November 21 through November 24 for similar issues including COPD exacerbation.  Patient reports not taking her medication since discharge.  Presented to the ER afebrile, heart rate 100s, BP stable.  Satting 96% on 3 L nasal cannula.  White count 23.4, hemoglobin 15, platelets 401, lactate 1.6, procalcitonin less than 0.1, troponin 18, urinalysis indicative of infection.  Urine drug screen negative.  Chest x-ray with a right sided pneumonia.  BNP within normal limits.  Creatinine 1.03.  Glucose 368.EKG sinus tachycardia.  Admitted for concern of pneumonia/COPD exacerbation and UTI. Started on Rocephin and azithromycin.  12/1: Vital stable, respiratory viral panel negative, procalcitonin negative, preliminary blood cultures negative.  Urine cultures are ordered as add-on, ammonia levels mildly elevated at 39, strep pneumo negative, CBG elevated at 244, history of enlarging pulmonary nodule with recommendations for PET/CT during most recent admission which has not been done yet, if video bronchoscopy was scheduled for 10/13/2023 as outpatient.  Unable to take care of himself and son cannot provide the required care as she required 24-hour supervision due to worsening dementia.  PT is recommending SNF.  12/2: Hemodynamically stable, now on baseline oxygen of 2 L.  Needs SNF placement. Urine cultures pending.  12/3:  Blood pressure started trending up, restarting home Zestoretic.  Urine cultures with strep agalactiae, penicillin allergy noted, will continue with ceftriaxone for now.  Also started on Remeron for concern of worsening depression and unable to sleep at night  12/4.  Some very concerned about patient's mental status not being seen.  Patient receiving antibiotics already and should have improved by now.  Will hold Lipitor.  Restart low-dose Zoloft.  Discontinue Remeron and do Seroquel at night. 12/5.  Patient has some blurry vision and weakness and pain.  MRI of the brain negative for acute intracranial abnormality. 12/6.  Patient stated she slept well.  Patient able to lift up her arms up off the bed today.  Patient thought she heard her son's voice outside the room. 12/7.  Patient sitting up in chair when I saw her.  Was able to feed herself breakfast.  Felt okay.  Did not offer any complaints. 12/8.  Patient more confused today than yesterday.  Able to follow some simple commands but not as good of a conversation today as yesterday. 12/9.  Patient still confused today.  Does not follow simple commands today. 12/10.  Patient more talkative today but was looking up at the ceiling when she was talking with me.  She does not move her extremities to command but does move them on her own.  Physical therapy and Occupational Therapy did not see her move her right arm.     Assessment and Plan: * Acute metabolic encephalopathy Patient able to communicate with me better today.  Does not move her extremities to command.  Physical therapy noting that she is not moving her right side.  Spontaneously did move her extremities for me  this morning but not to command.  MRI of the brain negative for acute intracranial abnormality, chronic microvascular ischemic changes.  Discontinued Lipitor.  Patient has elevated vitamin B12, nonreactive RPR, TSH in the normal range.  Potentially has a progressive dementia versus  limbic encephalitis versus autoimmune encephalitis versus leptomeningeal spread from spiculated nodule.  Lumbar puncture done 12/6 with testing sent to [facility].  Started high-dose Solu-Medrol on 12/6.  Patient will receive 5 days of high-dose Solu-Medrol (today will be day 5).  Repeat ABG does not show CO2 retention.  Repeat EEG does not show any seizure activity.  Case discussed with neurology and he will reevaluate tomorrow.  Uncontrolled type 2 diabetes mellitus with hypoglycemia, without long-term current use of insulin (HCC) With high-dose Solu-Medrol needed higher dose of long-acting insulin 26 units at night.  Will decrease down to 10 units for tomorrow night.  Hypotension Improved with fluid bolus.  Holding antihypertensive medications  Hypernatremia Sodium 146 today.  Acute on chronic respiratory failure with hypoxia (HCC) Patient initially required 3 L now on 1 L.  Lung nodule 1.3 cm right upper lobe spiculated in nature.  Could be a primary lung cancerous process.  Will need outpatient PET scan and likely bronchoscopy for diagnosis.  Spoke with Dr. Karna Christmas today and she has missed a few outpatient appointments.  COPD exacerbation (HCC) Resolved  Pneumonia Completed 5 days of Rocephin and Zithromax  UTI (urinary tract infection) Streptococcus agalactiae growing out of urine culture.  Completed Rocephin  (HFpEF) heart failure with preserved ejection fraction (HCC) Chronic in nature.  Not in acute exacerbation.  No signs of heart failure currently.  2D echo 09/2023 showed LVEF 55 to 60%, no regional wall motion abnormalities    Essential hypertension Holding antihypertensive medications with hypotension.  Type 2 diabetes mellitus with obesity (HCC) Sugars high now that she is on high-dose Solu-Medrol.  BMI 30.88        Subjective: Patient kept her eyes closed most of the time I was talking with her.  I asked her to open her eyes that she was looking at the  ceiling and did not focus on me.  She was able to have a better conversation with me today but still did not follow commands but moves her extremities on her own.  Patient states she ate a banana for breakfast and that her children visited her yesterday.  Physical Exam: Vitals:   10/11/23 1418 10/11/23 2040 10/12/23 0133 10/12/23 0750  BP: 99/60 114/62 (!) 120/57 130/79  Pulse: 83 95 89 92  Resp:  17 20 15   Temp:  98.2 F (36.8 C) 98.1 F (36.7 C) 98.3 F (36.8 C)  TempSrc:   Oral   SpO2: 96% 96% 97% 96%  Weight:      Height:       Physical Exam HENT:     Head: Normocephalic.     Mouth/Throat:     Comments: Did not open mouth for me today. Eyes:     General: Lids are normal.     Conjunctiva/sclera: Conjunctivae normal.  Cardiovascular:     Rate and Rhythm: Normal rate and regular rhythm.     Heart sounds: Normal heart sounds, S1 normal and S2 normal.  Pulmonary:     Breath sounds: No decreased breath sounds, wheezing, rhonchi or rales.  Abdominal:     Palpations: Abdomen is soft.     Tenderness: There is no abdominal tenderness.  Musculoskeletal:     Right lower leg:  No swelling.     Left lower leg: No swelling.  Skin:    General: Skin is warm.     Findings: No rash.  Neurological:     Mental Status: She is alert.     Comments: Talked with me better today but kept her eyes looking up at the ceiling and did not focus on me.  Moves her arms and legs on her own but not to command.     Data Reviewed: Creatinine 1.03, sodium 146  Family Communication: Spoke with son on the phone.  He states that his sister Gunnar Fusi should be the new primary contact  Disposition: Status is: Inpatient Remains inpatient appropriate because: Finishing up fifth day of high-dose steroids today.  Planned Discharge Destination: Rehab    Time spent: 28 minutes  Author: Alford Highland, MD 10/12/2023 3:29 PM  For on call review [email or web address].

## 2023-10-12 NOTE — Progress Notes (Signed)
Patient blood sugar 228, on-call notified, no new order. We continue to monitor.

## 2023-10-12 NOTE — TOC Progression Note (Signed)
Transition of Care [facility]) - Progression Note    Patient Details  Name: Michele House MRN: [id number] Date of Birth: 09/19/1956  Transition of Care [facility]) CM/SW Contact  Marlowe Sax, RN Phone Number: 10/12/2023, 10:03 AM  Clinical Narrative:    The patient remains confused, TOC will continue to follow and assist with DC plan , disposition pending   Expected Discharge Plan: Skilled Nursing Facility Barriers to Discharge: Continued Medical Work up  Expected Discharge Plan and Services                                               Social Determinants of Health (SDOH) Interventions SDOH Screenings   Food Insecurity: Patient Unable To Answer (10/03/2023)  Recent Concern: Food Insecurity - Food Insecurity Present (09/24/2023)  Housing: High Risk (10/03/2023)  Transportation Needs: Patient Unable To Answer (10/03/2023)  Recent Concern: Transportation Needs - Unmet Transportation Needs (09/24/2023)  Utilities: Patient Unable To Answer (10/03/2023)  Tobacco Use: Medium Risk (10/02/2023)    Readmission Risk Interventions     No data to display

## 2023-10-12 NOTE — Progress Notes (Signed)
Occupational Therapy Treatment Patient Details Name: Michele House MRN: [id number] DOB: [birth year] Today's Date: 10/12/2023   History of present illness Pt is a 67 y/o F admitted on 10/02/23 after presenting with acute on chronic respiratory failure with hypoxia, COPD exacerbation, PNA, UTI, & encephalopathy. PMH: COPD, chronic respiratory failure on 2L,HTN, DM2, HFpEF   OT comments  Pt seen for OT/PT co-treatment on this date. Upon arrival to room pt supine in bed, agreeable to tx session. OT facilitated ADL management with assist as described below. See ADL section for additional details regarding occupational performance. Pt continues to be functionally limited by decreased cognition, decreased activity tolerance, and R sided weakness. Pt is making limited progress toward OT goals, continues to be followed by nurology for ongoing workup. Goals downgraded to reflect current pt functional status. She continues to benefit from skilled OT services to maximize return to PLOF and minimize risk of future falls, injury, caregiver burden, and readmission. Discharge recommendation remains appropriate.        If plan is discharge home, recommend the following:  Assistance with cooking/housework;Assist for transportation;Help with stairs or ramp for entrance;Direct supervision/assist for medications management;Supervision due to cognitive status;Direct supervision/assist for financial management;Two people to help with walking and/or transfers;Two people to help with bathing/dressing/bathroom   Equipment Recommendations  None recommended by OT    Recommendations for Other Services      Precautions / Restrictions Precautions Precautions: Fall Restrictions Weight Bearing Restrictions: No       Mobility Bed Mobility Overal bed mobility: Needs Assistance Bed Mobility: Supine to Sit, Sit to Supine     Supine to sit: +2 for physical assistance, Max assist Sit to supine: Max assist, +2 for  physical assistance   General bed mobility comments: assist for trunk and B LE's; vc's for technique    Transfers                   General transfer comment: Attempted STS from EOB with personal 4WW present to support cognitive sequencing of task, however pt is unable to initiate any lift off from seated surface.     Balance Overall balance assessment: Needs assistance Sitting-balance support: Bilateral upper extremity supported, Feet supported Sitting balance-Leahy Scale: Zero Sitting balance - Comments: MAX-TOTAL A to maintain static sitting at EOB.     Standing balance-Leahy Scale: Zero                             ADL either performed or assessed with clinical judgement   ADL Overall ADL's : Needs assistance/impaired                     Lower Body Dressing: Bed level;+2 for safety/equipment;Maximal assistance;Total assistance Lower Body Dressing Details (indicate cue type and reason): MAX A to don bilat socks at EOB with +2 assist required to support sitting balance at EOB. Pt is able to activate her LLE to assist with donning L sock but is unable to achieve any active movement of her RLE and requires TOTAL A to don her R socks     Toileting- Clothing Manipulation and Hygiene: Bed level;Total assistance;+2 for physical assistance         General ADL Comments: MAX-TOTAL A for all bed mobility and MAX A to maintain static sitting balance at EOB. Attempted STS t/f with +2 assist however, pt is unable to initiate any elevation from seated surface.    Extremity/Trunk  Assessment              Vision       Perception     Praxis      Cognition Arousal: Alert Behavior During Therapy: Flat affect Overall Cognitive Status: No family/caregiver present to determine baseline cognitive functioning                                          Exercises Other Exercises Other Exercises: OT facilitated ADL management with education and  assist as described above.    Shoulder Instructions       General Comments Pt on 2L [place] t/o session spO2 remains WNL (97-98%) HR noted to be in upper 90's at rest.    Pertinent Vitals/ Pain       Pain Assessment Pain Assessment: Faces Faces Pain Scale: No hurt  Home Living                                          Prior Functioning/Environment              Frequency  Min 1X/week        Progress Toward Goals  OT Goals(current goals can now be found in the care plan section)  Progress towards OT goals: Goals drowngraded-see care plan  Acute Rehab OT Goals Patient Stated Goal: to feel better OT Goal Formulation: With patient Time For Goal Achievement: 10/26/23 Potential to Achieve Goals: Good ADL Goals Pt Will Perform Lower Body Dressing: with min assist;sitting/lateral leans Pt Will Transfer to Toilet: bedside commode;with mod assist;squat pivot transfer Pt Will Perform Toileting - Clothing Manipulation and hygiene: with mod assist;sitting/lateral leans;sit to/from stand  Plan      Co-evaluation    PT/OT/SLP Co-Evaluation/Treatment: Yes Reason for Co-Treatment: Complexity of the patient's impairments (multi-system involvement) PT goals addressed during session: Mobility/safety with mobility;Balance OT goals addressed during session: ADL's and self-care;Proper use of Adaptive equipment and DME      AM-PAC OT "6 Clicks" Daily Activity     Outcome Measure   Help from another person eating meals?: A Little Help from another person taking care of personal grooming?: A Lot Help from another person toileting, which includes using toliet, bedpan, or urinal?: Total Help from another person bathing (including washing, rinsing, drying)?: Total Help from another person to put on and taking off regular upper body clothing?: A Lot Help from another person to put on and taking off regular lower body clothing?: Total 6 Click Score: 10    End of  Session    OT Visit Diagnosis: Other abnormalities of gait and mobility (R26.89);Muscle weakness (generalized) (M62.81)   Activity Tolerance Patient tolerated treatment well   Patient Left in bed;with call bell/phone within reach;with bed alarm set   Nurse Communication          Time: 1610-9604 OT Time Calculation (min): 25 min  Charges: OT General Charges $OT Visit: 1 Visit OT Treatments $Self Care/Home Management : 8-22 mins  Rockney Ghee, M.S., OTR/L 10/12/23, 3:48 PM

## 2023-10-12 NOTE — Progress Notes (Signed)
NEUROLOGY CONSULT FOLLOW UP NOTE   Date of service: October 12, 2023 Patient Name: Michele House MRN:  [id number] DOB:  10/09/1956  Brief HPI   This is a 67 year old woman with past medical history significant for COPD, chronic respiratory failure on 2 L of home O2, hypertension, type 2 diabetes, heart failure with preserved ejection fraction who presented with acute on chronic respiratory failure with hypoxia, COPD exacerbation, pneumonia, and UTI.  Neurology was consulted for altered mental status however upon further discussions with her family her clinical presentation is more consistent with rapidly progressive dementia with patient at baseline 6 months ago now requiring 24-hour supervision and assistance with her ADLs.  She has a spiculated nodule in in the right upper lobe which is increased in size and most recent CTA chest performed 2 weeks ago.  Given what appears to be a rapidly progressive dementia in the setting of possible pulmonary malignancy, patient is currently undergoing 5-day treatment with high-dose steroids for empiric treatment of autoimmune encephalitis while awaiting autoantibody panels to result.   Interval Hx/subjective  Patient seen and examined. Appears obtunded but grimaces to nox stim. According to RN and primary hospitalist - she was able to talk to them this AM, while still just gazing over tot he ceiling.   Vitals   Vitals:   10/11/23 1418 10/11/23 2040 10/12/23 0133 10/12/23 0750  BP: 99/60 114/62 (!) 120/57 130/79  Pulse: 83 95 89 92  Resp:  17 20 15   Temp:  98.2 F (36.8 C) 98.1 F (36.7 C) 98.3 F (36.8 C)  TempSrc:   Oral   SpO2: 96% 96% 97% 96%  Weight:      Height:         Body mass index is 30.88 kg/m.  Physical Exam   General: Well-developed well-nourished: Slightly unkempt comfortably laying in bed HEENT: Normocephalic atraumatic Lungs: Scattered rales Cardiovascular: Regular rate rhythm Abdomen nondistended  nontender Neurological exam Appears obtunded. Does not respond to pain. On multiple trials to communicate, verbalizes with incomprehensible sounds. Does not follow commands. CN: PERRL, gaze midline, does not blink to threat from either side. Face appears symmetric. Motor: does not move any of the extremities to command but RN reported that she moves hear bilateral upper ext spontaneously off and on. Sensory: grimaces to nox stim all over but does not withdraw much in any limb. Coord: difficult to assess  Medications  Current Facility-Administered Medications:    0.45 % sodium chloride infusion, , Intravenous, Continuous, Wieting, Richard, MD, Last Rate: 40 mL/hr at 10/11/23 1656, New Bag at 10/11/23 1656   acetaminophen (TYLENOL) tablet 650 mg, 650 mg, Oral, Q6H PRN, Floydene Flock, MD, 650 mg at 10/10/23 0601   dextromethorphan-guaiFENesin (MUCINEX DM) 30-600 MG per 12 hr tablet 1 tablet, 1 tablet, Oral, BID PRN, Floydene Flock, MD   enoxaparin (LOVENOX) injection 40 mg, 40 mg, Subcutaneous, Daily, Wieting, Richard, MD, 40 mg at 10/12/23 1029   insulin aspart (novoLOG) injection 0-15 Units, 0-15 Units, Subcutaneous, TID WC, Wieting, Richard, MD, 8 Units at 10/12/23 1224   insulin aspart (novoLOG) injection 0-5 Units, 0-5 Units, Subcutaneous, QHS, Wieting, Richard, MD, 2 Units at 10/11/23 2115   [START ON 10/13/2023] insulin glargine-yfgn (SEMGLEE) injection 10 Units, 10 Units, Subcutaneous, QHS, Wieting, Richard, MD   insulin glargine-yfgn (SEMGLEE) injection 22 Units, 22 Units, Subcutaneous, QHS, Wieting, Richard, MD   ipratropium-albuterol (DUONEB) 0.5-2.5 (3) MG/3ML nebulizer solution 3 mL, 3 mL, Nebulization, Q4H PRN, Floydene Flock, MD  levothyroxine (SYNTHROID) tablet 100 mcg, 100 mcg, Oral, Q0600, Floydene Flock, MD, 100 mcg at 10/12/23 0530   loratadine (CLARITIN) tablet 10 mg, 10 mg, Oral, Daily, Floydene Flock, MD, 10 mg at 10/12/23 1029   methylPREDNISolone sodium  succinate (SOLU-MEDROL) 1,000 mg in sodium chloride 0.9 % 50 mL IVPB, 1,000 mg, Intravenous, Q24H, Wieting, Richard, MD, Last Rate: 66 mL/hr at 10/11/23 1704, 1,000 mg at 10/11/23 1704   ondansetron (ZOFRAN) tablet 4 mg, 4 mg, Oral, Q6H PRN **OR** ondansetron (ZOFRAN) injection 4 mg, 4 mg, Intravenous, Q6H PRN, Floydene Flock, MD   Oral care mouth rinse, 15 mL, Mouth Rinse, PRN, Renae Gloss, Richard, MD   QUEtiapine (SEROQUEL) tablet 12.5 mg, 12.5 mg, Oral, QHS, Wieting, Richard, MD, 12.5 mg at 10/11/23 2115   sertraline (ZOLOFT) tablet 25 mg, 25 mg, Oral, Daily, Renae Gloss, Richard, MD, 25 mg at 10/12/23 1029 Labs and Diagnostic Imaging   CBC:  Recent Labs  Lab 10/09/23 0243 10/11/23 0432  WBC 6.5 9.2  HGB 13.2 13.1  HCT 40.6 41.2  MCV 88.5 90.4  PLT 217 223    Basic Metabolic Panel:  Lab Results  Component Value Date   NA 146 (H) 10/12/2023   K 3.8 10/12/2023   CO2 31 10/12/2023   GLUCOSE 251 (H) 10/12/2023   BUN 56 (H) 10/12/2023   CREATININE 1.03 (H) 10/12/2023   CALCIUM 8.7 (L) 10/12/2023   GFRNONAA 59 (L) 10/12/2023   GFRAA >60 08/19/2017   MRI Brain 1. No acute intracranial abnormality. 2. Confluent hyperintense T2-weighted signal within the superior bihemispheric white matter, most commonly indicating chronic microvascular ischemia.   CNS imaging personally reviewed, I agree with above interpretation   CTA chest 09/24/23 No evidence of pulmonary embolus.   Patchy bilateral ground-glass and nodular opacities, right greater than left. Appearance is concerning for pneumonia.   Enlarging spiculated nodule in the inferior right upper lobe now 1.6 cm compared to 1.3 cm previously. Recommend further evaluation with PET CT.  rEEG 12/6 showed mild diffuse slowing without epileptiform abnormalities. Repeat EEG on 10/11/23 also suggestive of diffuse slowing without seizures or epileptiform discharges.  Assessment  67 year old woman with past medical history significant for  COPD, chronic respiratory failure on 2 L of home O2, hypertension, type 2 diabetes, heart failure with preserved ejection fraction who presented with acute on chronic respiratory failure with hypoxia, COPD exacerbation, pneumonia, and UTI. Neurology was consulted for altered mental status.   She does seem to have some acute delirium associated with the hospital stay and was hallucinating during examination couple days ago, which is a new symptom for her.   Dr. Selina Cooley spoke to her son and daughter both at length who report a rapidly progressive decline from her baseline cognitive status approximately 6 months ago. She is now unable to care for herself.  Workup for reversible causes of dementia has thus far been unremarkable. She was found to have a spiculated nodule in the inferior right upper lobe which has now increased in size as of most recent CTA chest on November 22. 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 Her LP results were bland with no pleiocytosis.   She was started on solumedrol 12/8 for empiric tx of autoimmune encephalitis.   Mental status remains waxing and waning-question if there is any underlying electrographic abnormality not caught on routine EEG versus  contribution from hypoxia/hypercarbia given chronic COPD.  Arterial blood gas reassuring.  Routine EEG x 2 reassuring.  At this time, I would recommend completion of steroids and close monitoring  Recommendations   - F/u [facility] autoimmune encephalopathy CSF send-out to [facility] clinic - F/u ENS2 autoimmune encephalopathy panel serum send-out to [facility] clinic - 5 day course high-dose steroids 1g q 24 hrs end date 12/11. Anticipate discharge to SNF at that time so that she may undergo PET CT of her lung to facilitate malignancy workup (this is  unable to be done inpatient). If this is paraneoplastic ultimately treatment of autoimmune encephalitis will depend on treatment of the malignancy, additionally we do not want to significantly delay malignancy workup by keeping her in the hospital -I had initially discussed with the hospitalist the possibility of transfer to [facility] for LTM EEG but I am really not sure if that would be of much use.  I will evaluate her again tomorrow and discussed with the hospitalist and family.   Neurology will continue to follow  Plan discussed with Dr. Renae Gloss   -- Milon Dikes, MD Neurologist [facility]

## 2023-10-13 ENCOUNTER — Inpatient Hospital Stay: Payer: 59

## 2023-10-13 ENCOUNTER — Ambulatory Visit: Admission: RE | Admit: 2023-10-13 | Payer: 59 | Source: Home / Self Care

## 2023-10-13 ENCOUNTER — Encounter: Admission: RE | Payer: Self-pay | Source: Home / Self Care

## 2023-10-13 DIAGNOSIS — G9341 Metabolic encephalopathy: Secondary | ICD-10-CM | POA: Diagnosis not present

## 2023-10-13 LAB — GLUCOSE, CAPILLARY
Glucose-Capillary: 214 mg/dL — ABNORMAL HIGH (ref 70–99)
Glucose-Capillary: 223 mg/dL — ABNORMAL HIGH (ref 70–99)
Glucose-Capillary: 254 mg/dL — ABNORMAL HIGH (ref 70–99)
Glucose-Capillary: 282 mg/dL — ABNORMAL HIGH (ref 70–99)

## 2023-10-13 LAB — CYTOLOGY - NON PAP

## 2023-10-13 SURGERY — BRONCHOSCOPY, WITH EBUS
Anesthesia: General

## 2023-10-13 MED ORDER — STROKE: EARLY STAGES OF RECOVERY BOOK: Freq: Once | Status: AC

## 2023-10-13 MED ORDER — CLOPIDOGREL BISULFATE 75 MG PO TABS
75.0000 mg | ORAL_TABLET | Freq: Every day | ORAL | Status: DC
  Administered 2023-10-14 – 2023-10-28 (×14): 75 mg via ORAL
  Filled 2023-10-13 (×14): qty 1

## 2023-10-13 MED ORDER — IOHEXOL 350 MG/ML SOLN
75.0000 mL | Freq: Once | INTRAVENOUS | Status: AC | PRN
  Administered 2023-10-13: 75 mL via INTRAVENOUS

## 2023-10-13 MED ORDER — INSULIN GLARGINE-YFGN 100 UNIT/ML ~~LOC~~ SOLN
15.0000 [IU] | Freq: Every day | SUBCUTANEOUS | Status: DC
Start: 2023-10-13 — End: 2023-10-24
  Administered 2023-10-14 – 2023-10-23 (×10): 15 [IU] via SUBCUTANEOUS
  Filled 2023-10-13 (×12): qty 0.15

## 2023-10-13 NOTE — Progress Notes (Signed)
Progress Note   Patient: Michele House NUU:[id number] DOB: 1956-07-29 DOA: 10/02/2023     11 DOS: the patient was seen and examined on 10/13/2023   Brief hospital course: Taken from H&P.  Michele House is a 67 y.o. female with medical history significant of COPD, chronic respiratory failure on 2 L, hypertension, type 2 diabetes, HFpEF presenting with acute on chronic respiratory failure with hypoxia, COPD exacerbation, pneumonia, UTI, encephalopathy.  Patient reports generalized malaise over the past 3 to 4 days.  Patient noted to have been recently admitted in the hospital November 21 through November 24 for similar issues including COPD exacerbation.  Patient reports not taking her medication since discharge.  Presented to the ER afebrile, heart rate 100s, BP stable.  Satting 96% on 3 L nasal cannula.  White count 23.4, hemoglobin 15, platelets 401, lactate 1.6, procalcitonin less than 0.1, troponin 18, urinalysis indicative of infection.  Urine drug screen negative.  Chest x-ray with a right sided pneumonia.  BNP within normal limits.  Creatinine 1.03.  Glucose 368.EKG sinus tachycardia.  Admitted for concern of pneumonia/COPD exacerbation and UTI. Started on Rocephin and azithromycin.  12/1: Vital stable, respiratory viral panel negative, procalcitonin negative, preliminary blood cultures negative.  Urine cultures are ordered as add-on, ammonia levels mildly elevated at 39, strep pneumo negative, CBG elevated at 244, history of enlarging pulmonary nodule with recommendations for PET/CT during most recent admission which has not been done yet, if video bronchoscopy was scheduled for 10/13/2023 as outpatient.  Unable to take care of himself and son cannot provide the required care as she required 24-hour supervision due to worsening dementia.  PT is recommending SNF.  12/2: Hemodynamically stable, now on baseline oxygen of 2 L.  Needs SNF placement. Urine cultures pending.  12/3:  Blood pressure started trending up, restarting home Zestoretic.  Urine cultures with strep agalactiae, penicillin allergy noted, will continue with ceftriaxone for now.  Also started on Remeron for concern of worsening depression and unable to sleep at night  12/4.  Some very concerned about patient's mental status not being seen.  Patient receiving antibiotics already and should have improved by now.  Will hold Lipitor.  Restart low-dose Zoloft.  Discontinue Remeron and do Seroquel at night. 12/5.  Patient has some blurry vision and weakness and pain.  MRI of the brain negative for acute intracranial abnormality. 12/6.  Patient stated she slept well.  Patient able to lift up her arms up off the bed today.  Patient thought she heard her son's voice outside the room. 12/7.  Patient sitting up in chair when I saw her.  Was able to feed herself breakfast.  Felt okay.  Did not offer any complaints. 12/8.  Patient more confused today than yesterday.  Able to follow some simple commands but not as good of a conversation today as yesterday. 12/9.  Patient still confused today.  Does not follow simple commands today. 12/10.  Patient more talkative today but was looking up at the ceiling when she was talking with me.  She does not move her extremities to command but does move them on her own.  Physical therapy and Occupational Therapy did not see her move her right arm.     Assessment and Plan: * Acute metabolic encephalopathy Patient able to communicate better today.  Still having some right arm weakness, spontaneously did move her extremities for me this morning but not to command.  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   Discontinued Lipitor.  Patient has elevated vitamin B12, nonreactive RPR, TSH in the normal range.  Potentially has a progressive dementia versus limbic encephalitis versus autoimmune  encephalitis versus leptomeningeal spread from spiculated nodule.  Lumbar puncture done 12/6 with testing sent to the [facility].  Started high-dose Solu-Medrol on 12/6.  Patient completed 5 days of high-dose Solu-Medrol (today will be day 5).  Repeat ABG does not show CO2 retention.  Repeat EEG does not show any seizure activity.  Case discussed with neurology and he will reevaluate tomorrow.  Uncontrolled type 2 diabetes mellitus with hypoglycemia, without long-term current use of insulin (HCC) CBG elevated.  Patient completed 5-day course of high-dose steroid. -Increasing Semglee to 15 unit daily -Continue with SSI  Hypotension Improved with fluid bolus.  Holding antihypertensive medications  Hypernatremia Sodium 146 yesterday. -Continue to monitor  Acute on chronic respiratory failure with hypoxia (HCC) Patient initially required 3 L now on 1 L.  Lung nodule 1.3 cm right upper lobe spiculated in nature.  Could be a primary lung cancerous process.  Will need outpatient PET scan and likely bronchoscopy for diagnosis.  Spoke with Dr. Karna Christmas today and she has missed a few outpatient appointments.  COPD exacerbation (HCC) Resolved  Pneumonia Completed 5 days of Rocephin and Zithromax  UTI (urinary tract infection) Streptococcus agalactiae growing out of urine culture.  Completed Rocephin  (HFpEF) heart failure with preserved ejection fraction (HCC) Chronic in nature.  Not in acute exacerbation.  No signs of heart failure currently.  2D echo 09/2023 showed LVEF 55 to 60%, no regional wall motion abnormalities    Essential hypertension Holding antihypertensive medications with hypotension.  Type 2 diabetes mellitus with obesity (HCC) Sugars high now that she is on high-dose Solu-Medrol.  BMI 30.88   Subjective: Patient was seen and examined today.  She was little more interactive and following commands.  Still feeling very weak.  Physical Exam: Vitals:   10/12/23 1642  10/12/23 2242 10/13/23 0836 10/13/23 1608  BP: (!) 147/69 (!) 140/75 (!) 132/105 (!) 118/54  Pulse: 86 92 98 90  Resp: 16 20 17 18   Temp: 97.7 F (36.5 C) 98.5 F (36.9 C) 98.2 F (36.8 C) (!) 97.4 F (36.3 C)  TempSrc:   Oral Oral  SpO2: 98% 95% 98% 95%  Weight:      Height:       General.  Frail elderly lady, in no acute distress. Pulmonary.  Lungs clear bilaterally, normal respiratory effort. CV.  Regular rate and rhythm, no JVD, rub or murmur. Abdomen.  Soft, nontender, nondistended, BS positive. CNS.  Alert and oriented x 2.  Some right upper extremity weakness Extremities.  No edema, no cyanosis, pulses intact and symmetrical.   Data Reviewed: Prior data reviewed  Family Communication:   Disposition: Status is: Inpatient Remains inpatient appropriate because: Finishing up fifth day of high-dose steroids today.  Planned Discharge Destination: Rehab  DVT prophylaxis.  Lovenox Time spent: 50 minutes  This record has been created using Conservation officer, historic buildings. Errors have been sought and corrected,but may not always be located. Such creation errors do not reflect on the standard of care.   Author: Arnetha Courser, MD 10/13/2023 4:57 PM  For on call review [email or web address].

## 2023-10-13 NOTE — Progress Notes (Signed)
Noted elevated b/p on vitals, nurse notified MD and will recheck on her return from MRI.

## 2023-10-13 NOTE — Progress Notes (Signed)
Patient returns from MRI, b/p 118/54.

## 2023-10-13 NOTE — Progress Notes (Signed)
NEUROLOGY CONSULT FOLLOW UP NOTE   Date of service: October 13, 2023 Patient Name: Michele House MRN:  [id number] DOB:  05/20/1956  Interval Hx/subjective  Patient seen and examined. Awake today. Followed commands, displayed no aphasia today- but weaker on right - see exam below.   Vitals   Vitals:   10/12/23 0750 10/12/23 1642 10/12/23 2242 10/13/23 0836  BP: 130/79 (!) 147/69 (!) 140/75 (!) 132/105  Pulse: 92 86 92 98  Resp: 15 16 20 17   Temp: 98.3 F (36.8 C) 97.7 F (36.5 C) 98.5 F (36.9 C) 98.2 F (36.8 C)  TempSrc:    Oral  SpO2: 96% 98% 95% 98%  Weight:      Height:         Body mass index is 30.88 kg/m.  Physical Exam   General: Well-developed well-nourished: Slightly unkempt comfortably laying in bed HEENT: Normocephalic atraumatic Lungs: Scattered rales Cardiovascular: Regular rate rhythm Abdomen nondistended nontender Neurological exam She is awake, again staring at the ceiling, she is able to participate in the exam much better today than she has been in the past 2 days for me. She is awake alert oriented to the fact that she is in the hospital She could not tell me the current month Her speech was clear She was able to name simple objects consistently She was able to follow simple commands Cranial nerves: Pupils are equal round reactive light, she does not look to either direction upon asking her to but blinks to threat from both sides, face appears symmetric, tongue and palate midline. Motor examination: She is able to raise her left arm against gravity with a drift and left leg against gravity with a drift.  Refuses to move her right upper or lower extremity.  To noxious simulation, she does withdrawal right upper and lower extremity. Sensation: Intact all over to light touch Coordination difficult to assess   Medications  Current Facility-Administered Medications:    acetaminophen (TYLENOL) tablet 650 mg, 650 mg, Oral, Q6H PRN, Floydene Flock, MD, 650 mg at 10/10/23 0601   dextromethorphan-guaiFENesin (MUCINEX DM) 30-600 MG per 12 hr tablet 1 tablet, 1 tablet, Oral, BID PRN, Floydene Flock, MD   enoxaparin (LOVENOX) injection 40 mg, 40 mg, Subcutaneous, Daily, Wieting, Richard, MD, 40 mg at 10/13/23 1049   insulin aspart (novoLOG) injection 0-15 Units, 0-15 Units, Subcutaneous, TID WC, Alford Highland, MD, 8 Units at 10/13/23 0851   insulin aspart (novoLOG) injection 0-5 Units, 0-5 Units, Subcutaneous, QHS, Alford Highland, MD, 2 Units at 10/11/23 2115   insulin glargine-yfgn (SEMGLEE) injection 10 Units, 10 Units, Subcutaneous, QHS, Wieting, Richard, MD   ipratropium-albuterol (DUONEB) 0.5-2.5 (3) MG/3ML nebulizer solution 3 mL, 3 mL, Nebulization, Q4H PRN, Floydene Flock, MD   levothyroxine (SYNTHROID) tablet 100 mcg, 100 mcg, Oral, Q0600, Floydene Flock, MD, 100 mcg at 10/13/23 0558   loratadine (CLARITIN) tablet 10 mg, 10 mg, Oral, Daily, Floydene Flock, MD, 10 mg at 10/13/23 1049   ondansetron (ZOFRAN) tablet 4 mg, 4 mg, Oral, Q6H PRN **OR** ondansetron (ZOFRAN) injection 4 mg, 4 mg, Intravenous, Q6H PRN, Floydene Flock, MD   Oral care mouth rinse, 15 mL, Mouth Rinse, PRN, Renae Gloss, Richard, MD   QUEtiapine (SEROQUEL) tablet 12.5 mg, 12.5 mg, Oral, QHS PRN, Alford Highland, MD, 12.5 mg at 10/13/23 0154   sertraline (ZOLOFT) tablet 25 mg, 25 mg, Oral, Daily, Renae Gloss, Richard, MD, 25 mg at 10/13/23 1049 Labs and Diagnostic Imaging   CBC:  Recent Labs  Lab 10/09/23 0243 10/11/23 0432  WBC 6.5 9.2  HGB 13.2 13.1  HCT 40.6 41.2  MCV 88.5 90.4  PLT 217 223    Basic Metabolic Panel:  Lab Results  Component Value Date   NA 146 (H) 10/12/2023   K 3.8 10/12/2023   CO2 31 10/12/2023   GLUCOSE 251 (H) 10/12/2023   BUN 56 (H) 10/12/2023   CREATININE 1.03 (H) 10/12/2023   CALCIUM 8.7 (L) 10/12/2023   GFRNONAA 59 (L) 10/12/2023   GFRAA >60 08/19/2017   MRI Brain 1. No acute intracranial abnormality. 2.  Confluent hyperintense T2-weighted signal within the superior bihemispheric white matter, most commonly indicating chronic microvascular ischemia.   CNS imaging personally reviewed, I agree with above interpretation   CTA chest 09/24/23 No evidence of pulmonary embolus.   Patchy bilateral ground-glass and nodular opacities, right greater than left. Appearance is concerning for pneumonia.   Enlarging spiculated nodule in the inferior right upper lobe now 1.6 cm compared to 1.3 cm previously. Recommend further evaluation with PET CT.  rEEG 12/6 showed mild diffuse slowing without epileptiform abnormalities. Repeat EEG on 10/11/23 also suggestive of diffuse slowing without seizures or epileptiform discharges.  Assessment  67 year old woman with past medical history significant for COPD, chronic respiratory failure on 2 L of home O2, hypertension, type 2 diabetes, heart failure with preserved ejection fraction who presented with acute on chronic respiratory failure with hypoxia, COPD exacerbation, pneumonia, and UTI. Neurology was consulted for altered mental status.   She does seem to have some acute delirium associated with the hospital stay and was hallucinating during examination couple days ago, which is a new symptom for her.   Dr. Selina Cooley spoke to her son and daughter both at length who report a rapidly progressive decline from her baseline cognitive status approximately 6 months ago. She is now unable to care for herself.  Workup for reversible causes of dementia has thus far been unremarkable. She was found to have a spiculated nodule in the inferior right upper lobe which has now increased in size as of most recent CTA chest on November 22. 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 Her LP  results were bland with no pleiocytosis.   She was started on solumedrol 12/8 for empiric tx of autoimmune encephalitis.   Mental status remains waxing and waning-question if there is any underlying electrographic abnormality not caught on routine EEG versus contribution from hypoxia/hypercarbia given chronic COPD.  Arterial blood gas reassuring.  Routine EEG x 2 reassuring.  At this time, I would recommend completion of steroids and close monitoring. Due to new right-sided weakness, will obtain repeat MRI.   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 Evaluate for new stroke   Recommendations   - F/u ENC2 autoimmune encephalopathy CSF send-out to [facility] clinic - F/u ENS2 autoimmune encephalopathy panel serum send-out to [facility] clinic - 5 day course high-dose steroids 1g q 24 hrs end date 12/11-today.  -Outpatient PET for possible malignancy workup  -Given some new right-sided weakness, repeat MRI  Neurology will continue to follow  Plan discussed with Dr. Nelson Chimes   -- Milon Dikes, MD Neurologist [facility]

## 2023-10-13 NOTE — Plan of Care (Signed)
  Problem: Coping: Goal: Ability to adjust to condition or change in health will improve Outcome: Progressing   Problem: Metabolic: Goal: Ability to maintain appropriate glucose levels will improve Outcome: Progressing   Problem: Clinical Measurements: Goal: Will remain free from infection Outcome: Progressing   Problem: Coping: Goal: Level of anxiety will decrease Outcome: Progressing

## 2023-10-13 NOTE — Plan of Care (Signed)
  Problem: Education: Goal: Ability to describe self-care measures that may prevent or decrease complications (Diabetes Survival Skills Education) will improve Outcome: Progressing Goal: Individualized Educational Video(s) Outcome: Progressing   Problem: Coping: Goal: Ability to adjust to condition or change in health will improve Outcome: Progressing   Problem: Fluid Volume: Goal: Ability to maintain a balanced intake and output will improve Outcome: Progressing   Problem: Health Behavior/Discharge Planning: Goal: Ability to identify and utilize available resources and services will improve Outcome: Progressing Goal: Ability to manage health-related needs will improve Outcome: Progressing   Problem: Metabolic: Goal: Ability to maintain appropriate glucose levels will improve Outcome: Progressing   Problem: Nutritional: Goal: Maintenance of adequate nutrition will improve Outcome: Progressing Goal: Progress toward achieving an optimal weight will improve Outcome: Progressing   Problem: Skin Integrity: Goal: Risk for impaired skin integrity will decrease Outcome: Progressing   Problem: Tissue Perfusion: Goal: Adequacy of tissue perfusion will improve Outcome: Progressing   Problem: Education: Goal: Knowledge of General Education information will improve Description: Including pain rating scale, medication(s)/side effects and non-pharmacologic comfort measures Outcome: Progressing   Problem: Health Behavior/Discharge Planning: Goal: Ability to manage health-related needs will improve Outcome: Progressing   Problem: Clinical Measurements: Goal: Ability to maintain clinical measurements within normal limits will improve Outcome: Progressing Goal: Will remain free from infection Outcome: Progressing Goal: Diagnostic test results will improve Outcome: Progressing Goal: Respiratory complications will improve Outcome: Progressing Goal: Cardiovascular complication will  be avoided Outcome: Progressing   Problem: Activity: Goal: Risk for activity intolerance will decrease Outcome: Progressing   Problem: Nutrition: Goal: Adequate nutrition will be maintained Outcome: Progressing   Problem: Coping: Goal: Level of anxiety will decrease Outcome: Progressing   Problem: Elimination: Goal: Will not experience complications related to bowel motility Outcome: Progressing Goal: Will not experience complications related to urinary retention Outcome: Progressing   Problem: Pain Management: Goal: General experience of comfort will improve Outcome: Progressing   Problem: Safety: Goal: Ability to remain free from injury will improve Outcome: Progressing   Problem: Skin Integrity: Goal: Risk for impaired skin integrity will decrease Outcome: Progressing   Problem: Education: Goal: Knowledge of disease or condition will improve Outcome: Progressing Goal: Knowledge of the prescribed therapeutic regimen will improve Outcome: Progressing Goal: Individualized Educational Video(s) Outcome: Progressing   Problem: Activity: Goal: Ability to tolerate increased activity will improve Outcome: Progressing Goal: Will verbalize the importance of balancing activity with adequate rest periods Outcome: Progressing   Problem: Respiratory: Goal: Ability to maintain a clear airway will improve Outcome: Progressing Goal: Levels of oxygenation will improve Outcome: Progressing Goal: Ability to maintain adequate ventilation will improve Outcome: Progressing   Problem: Activity: Goal: Ability to tolerate increased activity will improve Outcome: Progressing   Problem: Clinical Measurements: Goal: Ability to maintain a body temperature in the normal range will improve Outcome: Progressing   Problem: Respiratory: Goal: Ability to maintain adequate ventilation will improve Outcome: Progressing Goal: Ability to maintain a clear airway will improve Outcome:  Progressing

## 2023-10-14 ENCOUNTER — Inpatient Hospital Stay: Payer: 59

## 2023-10-14 DIAGNOSIS — G9341 Metabolic encephalopathy: Secondary | ICD-10-CM | POA: Diagnosis not present

## 2023-10-14 DIAGNOSIS — I639 Cerebral infarction, unspecified: Secondary | ICD-10-CM | POA: Diagnosis not present

## 2023-10-14 DIAGNOSIS — R911 Solitary pulmonary nodule: Secondary | ICD-10-CM | POA: Diagnosis not present

## 2023-10-14 LAB — GLUCOSE, CAPILLARY
Glucose-Capillary: 117 mg/dL — ABNORMAL HIGH (ref 70–99)
Glucose-Capillary: 128 mg/dL — ABNORMAL HIGH (ref 70–99)
Glucose-Capillary: 153 mg/dL — ABNORMAL HIGH (ref 70–99)
Glucose-Capillary: 165 mg/dL — ABNORMAL HIGH (ref 70–99)
Glucose-Capillary: 171 mg/dL — ABNORMAL HIGH (ref 70–99)
Glucose-Capillary: 171 mg/dL — ABNORMAL HIGH (ref 70–99)
Glucose-Capillary: 188 mg/dL — ABNORMAL HIGH (ref 70–99)
Glucose-Capillary: 267 mg/dL — ABNORMAL HIGH (ref 70–99)

## 2023-10-14 LAB — CBC
HCT: 37.4 % (ref 36.0–46.0)
Hemoglobin: 12.1 g/dL (ref 12.0–15.0)
MCH: 29.2 pg (ref 26.0–34.0)
MCHC: 32.4 g/dL (ref 30.0–36.0)
MCV: 90.3 fL (ref 80.0–100.0)
Platelets: 130 10*3/uL — ABNORMAL LOW (ref 150–400)
RBC: 4.14 MIL/uL (ref 3.87–5.11)
RDW: 13.9 % (ref 11.5–15.5)
WBC: 10.2 10*3/uL (ref 4.0–10.5)
nRBC: 0 % (ref 0.0–0.2)

## 2023-10-14 LAB — BASIC METABOLIC PANEL
Anion gap: 7 (ref 5–15)
BUN: 44 mg/dL — ABNORMAL HIGH (ref 8–23)
CO2: 29 mmol/L (ref 22–32)
Calcium: 8.5 mg/dL — ABNORMAL LOW (ref 8.9–10.3)
Chloride: 106 mmol/L (ref 98–111)
Creatinine, Ser: 0.91 mg/dL (ref 0.44–1.00)
GFR, Estimated: >60 mL/min (ref >60)
Glucose, Bld: 172 mg/dL — ABNORMAL HIGH (ref 70–99)
Potassium: 3.9 mmol/L (ref 3.5–5.1)
Sodium: 142 mmol/L (ref 135–145)

## 2023-10-14 LAB — LIPID PANEL
Cholesterol: 136 mg/dL (ref 0–200)
HDL: 29 mg/dL — ABNORMAL LOW (ref >40)
LDL Cholesterol: 38 mg/dL (ref 0–99)
Total CHOL/HDL Ratio: 4.7 {ratio}
Triglycerides: 347 mg/dL — ABNORMAL HIGH (ref <150)
VLDL: 69 mg/dL — ABNORMAL HIGH (ref 0–40)

## 2023-10-14 NOTE — Progress Notes (Addendum)
Patient is alert and oriented x4 this morning. Delayed responses but able to answer my questions. When attempting to do an NIH assessment pt not very corporative stating numerous times during assessment she didn't want to do anything and stated "I can see you and your hands when I want to but it is blurry." Difficult to assess deficits due to patient not fully participating despite numerous attempts. MD notified of assessment.

## 2023-10-14 NOTE — Consult Note (Signed)
[facility]  [phone] [phone]  ID: Carmelina Paddock OB: 29-Aug-1956  MR#: [id number]  XIP#:[id number]  Patient Care Team: Center, [facility] as PCP - General (General Practice) Jim Like, RN as Registered Nurse Scarlett Presto, RN (Inactive) as Registered Nurse  REFERRING PROVIDER: Dr. Nelson Chimes  REASON FOR REFERRAL: right lung nodule   ASSESSMENT AND PLAN:   Michele House is a 67 y.o. female with pmh of COPD, on 2 L oxygen, hypertension, type 2 diabetes, CHF presented on 10/02/2023 with COPD exacerbation, pneumonia, UTI and encephalopathy.  Medical oncology was consulted for further evaluation of right upper lobe nodule concerning for malignancy.  # Right lung nodule # Multiple acute infarcts # Acute encephalopathy -CT chest from 09/24/2023 showed increase in size of spiculated pulmonary nodule in the inferior right upper lobe 1.6 cm compared to 1.3 cm previously.  She will need PET CT scan as outpatient.  Can obtain CT abdomen pelvis for staging as inpatient. -She will need biopsy of right lung nodule for diagnosis.  Dr. Karna Christmas may be planning to schedule her for bronchoscopy as inpatient. -Stroke workup so far has been negative.  There is concern for paraneoplastic panel contributing to the etiology for multiple acute infarcts.  LP was done which was largely unremarkable.  Paraneoplastic Mayo panel has been sent.  Has received 5 days of IV Solu-Medrol with no improvement in mental status.  Dr. Wilford Corner from neurology is following.  Lower extremity venous Dopplers pending.  May need TEE to rule out PFO.  # COPD # Acute on chronic respiratory failure # UTI # Diabetes -Management per primary team.  Will continue to follow with the patient.  Will schedule outpatient appointment on discharge.  Patient expressed understanding and was in agreement with this plan. She also understands that She can call clinic at any time with  any questions, concerns, or complaints.   I spent a total of 60 minutes reviewing chart data, face-to-face evaluation with the patient, counseling and coordination of care as detailed above.   HPI: Michele House is a 67 y.o. female with pmh of COPD, on 2 L oxygen, hypertension, type 2 diabetes, CHF presented on 10/02/2023 with COPD exacerbation, pneumonia, UTI and encephalopathy.  Medical oncology was consulted for further evaluation of right upper lobe nodule concerning for malignancy.  Patient has been admitted with concern for COPD exacerbation/pneumonia and UTI.  Started on Rocephin and azithromycin.  CTA chest from 09/24/2023 showed increase in size of spiculated pulmonary nodule in the inferior right upper lobe 1.6 cm compared to 1.3 cm previously.  PET could not be scheduled since patient has been in the hospital.  She was scheduled for bronchoscopy as outpatient but was no-show.  Initial MRI brain from 10/06/2023 showed no acute intracranial abnormality.  Changes indicating chronic micro vascular ischemia.  Repeat MRI brain from 10/13/2023 with worsening mental status.  Showed multiple acute infarcts in hide bilateral posterior frontal and parietal lobe associated edema.  Petechial hemorrhage without mass occupying hemorrhage.  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  CTA head and neck negative for large vessel occlusion or emergent finding.  Atheromatous changes about the carotid bifurcation up to 50% stenosis bilaterally.  EEG showed diffuse slowing without seizures.  LP unremarkable.  Autoimmune/paraneoplastic panel sent.  Treated with 5 days of high-dose Solu-Medrol with not much improvement.  Patient was seen at bedside.  Confused.  Not able to answer any question  or follow conversation.  REVIEW OF SYSTEMS:   ROS  As per HPI. Otherwise, a complete review of systems is negative.  PAST MEDICAL HISTORY: Past Medical History:  Diagnosis Date    Acid reflux    COPD (chronic obstructive pulmonary disease) (HCC)    Diabetes mellitus without complication (HCC)    Hyperlipidemia    Hypertension    Thyroid disease     PAST SURGICAL HISTORY: Past Surgical History:  Procedure Laterality Date   appendectomy     APPENDECTOMY     BREAST BIOPSY Right    CORE W/CLIP - NEG   TOTAL VAGINAL HYSTERECTOMY     TUMOR REMOVAL     benign;stomach    FAMILY HISTORY: Family History  Problem Relation Age of Onset   Heart attack Mother    Hypertension Mother    Breast cancer Sister 31   Breast cancer Maternal Aunt        40'S   Breast cancer Maternal Grandmother     HEALTH MAINTENANCE: Social History   Tobacco Use   Smoking status: Former    Current packs/day: 0.00    Types: Cigarettes    Quit date: 12/15/1985    Years since quitting: 37.8   Smokeless tobacco: Never  Substance Use Topics   Alcohol use: No   Drug use: No     Allergies  Allergen Reactions   Penicillins Hives   Aspirin Other (See Comments)    Lips numb   Sulfa Antibiotics     Current Facility-Administered Medications  Medication Dose Route Frequency Provider Last Rate Last Admin   acetaminophen (TYLENOL) tablet 650 mg  650 mg Oral Q6H PRN Floydene Flock, MD   650 mg at 10/10/23 0601   clopidogrel (PLAVIX) tablet 75 mg  75 mg Oral Daily Milon Dikes, MD   75 mg at 10/14/23 1009   dextromethorphan-guaiFENesin (MUCINEX DM) 30-600 MG per 12 hr tablet 1 tablet  1 tablet Oral BID PRN Floydene Flock, MD       enoxaparin (LOVENOX) injection 40 mg  40 mg Subcutaneous Daily Wieting, Richard, MD   40 mg at 10/14/23 1009   insulin aspart (novoLOG) injection 0-15 Units  0-15 Units Subcutaneous TID WC Alford Highland, MD   8 Units at 10/14/23 1200   insulin aspart (novoLOG) injection 0-5 Units  0-5 Units Subcutaneous QHS Alford Highland, MD   2 Units at 10/11/23 2115   insulin glargine-yfgn (SEMGLEE) injection 15 Units  15 Units Subcutaneous QHS Amin, Tilman Neat,  MD       ipratropium-albuterol (DUONEB) 0.5-2.5 (3) MG/3ML nebulizer solution 3 mL  3 mL Nebulization Q4H PRN Floydene Flock, MD       levothyroxine (SYNTHROID) tablet 100 mcg  100 mcg Oral Q0600 Floydene Flock, MD   100 mcg at 10/13/23 0558   loratadine (CLARITIN) tablet 10 mg  10 mg Oral Daily Floydene Flock, MD   10 mg at 10/14/23 1009   ondansetron (ZOFRAN) tablet 4 mg  4 mg Oral Q6H PRN Floydene Flock, MD       Or   ondansetron [facility]) injection 4 mg  4 mg Intravenous Q6H PRN Floydene Flock, MD       Oral care mouth rinse  15 mL Mouth Rinse PRN Wieting, Richard, MD       QUEtiapine (SEROQUEL) tablet 12.5 mg  12.5 mg Oral QHS PRN Alford Highland, MD   12.5 mg at 10/13/23 0154   sertraline (ZOLOFT) tablet 25  mg  25 mg Oral Daily Alford Highland, MD   25 mg at 10/14/23 1009    OBJECTIVE: Vitals:   10/14/23 0756 10/14/23 1524  BP: 118/63 (!) 120/58  Pulse: 60 87  Resp: 18 18  Temp: 97.6 F (36.4 C) 97.7 F (36.5 C)  SpO2: 97% 98%     Body mass index is 30.88 kg/m.      General: Well-developed, well-nourished, no acute distress. Eyes: Pink conjunctiva, anicteric sclera. HEENT: Normocephalic, moist mucous membranes, clear oropharnyx. Lungs: Clear to auscultation bilaterally. Heart: Regular rate and rhythm. No rubs, murmurs, or gallops. Abdomen: Soft, nontender, nondistended. No organomegaly noted, normoactive bowel sounds. Musculoskeletal: No edema, cyanosis, or clubbing. Neuro: Alert, answering all questions appropriately. Cranial nerves grossly intact. Skin: No rashes or petechiae noted. Psych: Normal affect. Lymphatics: No cervical, calvicular, axillary or inguinal LAD.   LAB RESULTS:  Lab Results  Component Value Date   NA 142 10/14/2023   K 3.9 10/14/2023   CL 106 10/14/2023   CO2 29 10/14/2023   GLUCOSE 172 (H) 10/14/2023   BUN 44 (H) 10/14/2023   CREATININE 0.91 10/14/2023   CALCIUM 8.5 (L) 10/14/2023   PROT 6.1 (L) 10/09/2023   ALBUMIN 3.5  10/09/2023   AST 18 10/09/2023   ALT 18 10/09/2023   ALKPHOS 79 10/09/2023   BILITOT 1.2 (H) 10/09/2023   GFRNONAA >60 10/14/2023   GFRAA >60 08/19/2017    Lab Results  Component Value Date   WBC 10.2 10/14/2023   NEUTROABS 19.7 (H) 10/02/2023   HGB 12.1 10/14/2023   HCT 37.4 10/14/2023   MCV 90.3 10/14/2023   PLT 130 (L) 10/14/2023    No results found for: "TIBC", "FERRITIN", "IRONPCTSAT"   STUDIES: CT ANGIO HEAD NECK W WO CM Result Date: 10/14/2023 CLINICAL DATA:  Follow-up examination for stroke. EXAM: CT ANGIOGRAPHY HEAD AND NECK WITH AND WITHOUT CONTRAST TECHNIQUE: Multidetector CT imaging of the head and neck was performed using the standard protocol during bolus administration of intravenous contrast. Multiplanar CT image reconstructions and MIPs were obtained to evaluate the vascular anatomy. Carotid stenosis measurements (when applicable) are obtained utilizing NASCET criteria, using the distal internal carotid diameter as the denominator. RADIATION DOSE REDUCTION: This exam was performed according to the departmental dose-optimization program which includes automated exposure control, adjustment of the mA and/or kV according to patient size and/or use of iterative reconstruction technique. CONTRAST:  75mL OMNIPAQUE IOHEXOL 350 MG/ML SOLN COMPARISON:  Prior MRI from earlier the same day. FINDINGS: CT HEAD FINDINGS Brain: Age-related cerebral atrophy with mild chronic microvascular ischemic disease. Previously identified acute to early subacute ischemic infarcts involving the bilateral parieto-occipital regions again seen, stable from prior MRI. No visible associated hemorrhage by CT. No significant regional mass effect. No other new large vessel territory infarct. No other acute intracranial hemorrhage. 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 No hydrocephalus. No other mass lesion. No extra-axial fluid  collection. Vascular: No abnormal hyperdense vessel. Scattered vascular calcifications noted within the carotid siphons. Skull: Scalp soft tissues within normal limits.  Calvarium intact. Sinuses/Orbits: Globes and orbital soft tissues within normal limits. Paranasal sinuses are largely clear. No significant mastoid effusion. Other: None. Review of the MIP images confirms the above findings CTA NECK FINDINGS Aortic arch: Visualized aortic arch within normal limits for caliber with standard branch pattern. Mild-to-moderate aortic atherosclerosis. No significant stenosis about the origin the great vessels. Right carotid system: Right common and  internal carotid arteries are patent without dissection. Calcified plaque about the right carotid bulb with approximate 50% stenosis by NASCET criteria. Left carotid system: Left common and internal carotid arteries are patent without dissection. Atheromatous plaque about the left carotid bulb with up to 50% stenosis by NASCET criteria. Vertebral arteries: Left vertebral artery arises directly from the aortic arch. Right vertebral artery dominant. Vertebral arteries patent without stenosis or dissection. Skeleton: No discrete or worrisome osseous lesions. Mild for age degenerative spondylosis, most pronounced at C5-6. Other neck: No other acute finding. Upper chest: Emphysema. 1.7 cm spiculated right upper lobe pulmonary nodule. Few additional subcentimeter nodular densities noted within the visualized right lung, improved as compared to prior chest CT from 09/24/2023, suggesting improved and/or resolving pneumonia. Review of the MIP images confirms the above findings CTA HEAD FINDINGS Anterior circulation: Mild atheromatous change about the carotid siphons without hemodynamically significant stenosis. A1 segments patent bilaterally. Normal anterior communicating artery complex. Both ACAs patent to their distal aspects without significant stenosis. Both M1 segments widely  patent. Smooth tapering of proximal right M2 branch is noted. No high-grade proximal MCA branch stenosis or occlusion. Distal MCA branches perfused and fairly symmetric. Posterior circulation: Both V4 segments are patent without focal stenosis. Right vertebral artery slightly dominant. Both PICA patent. Basilar markedly diminutive but patent to its distal aspect without focal stenosis. Superior cerebral arteries patent bilaterally. Fetal type origin of the PCAs bilaterally. Both PCAs patent without proximal high-grade stenosis. Venous sinuses: Patent allowing for timing the contrast bolus. Anatomic variants: Fetal type origin of the PCAs with diminutive vertebrobasilar system. No intracranial aneurysm. Review of the MIP images confirms the above findings IMPRESSION: CT HEAD: 1. Evolving acute to early subacute ischemic infarcts involving the posterior parieto-occipital regions, stable from prior MRI. No associated hemorrhage evident by CT. No significant regional mass effect. 2. No other new acute intracranial abnormality. 3. 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 No hydrocephalus. 4. Age-related cerebral atrophy with mild chronic microvascular ischemic disease. CTA HEAD AND NECK: 1. Negative CTA for large vessel occlusion or other emergent finding. 2. Atheromatous change about the carotid bifurcations with up to 50% stenosis bilaterally. 3. 1.7 cm spiculated right upper lobe pulmonary nodule. Again, further evaluation with dedicated PET-CT recommended. Aortic Atherosclerosis (ICD10-I70.0) and Emphysema (ICD10-J43.9). Electronically Signed   By: Rise Mu M.D.   On: 10/14/2023 04:55   MR BRAIN WO CONTRAST Result Date: 10/13/2023 CLINICAL DATA:  Neuro deficit, acute, stroke suspected EXAM: MRI HEAD WITHOUT CONTRAST TECHNIQUE: Multiplanar, multiecho pulse sequences of the brain and surrounding structures were obtained without intravenous  contrast. COMPARISON:  None Available. FINDINGS: Brain: Multiple acute infarcts in the high bilateral posterior frontal and parietal lobes. Associated edema without significant mass effect. No midline shift. Mild petechial hemorrhage without mass occupying acute hemorrhage. Chronic mass in the right lateral ventricle measuring 1.0 cm. Vascular: Major arterial flow voids are maintained at the skull base. Skull and upper cervical spine: Normal marrow signal. Sinuses/Orbits: Clear sinuses.  No acute orbital findings. IMPRESSION: 1. Multiple acute infarcts in the high bilateral posterior frontal and parietal lobes, potentially watershed territory. Mild associated petechial hemorrhage. 2. 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 These results will be called to the ordering clinician or representative by the Radiologist Assistant, and communication documented in the PACS or [employer]. Electronically Signed   By: Feliberto Harts M.D.   On: 10/13/2023 15:42  EEG adult Result Date: 10/12/2023 Charlsie Quest, MD     10/12/2023  8:31 AM Patient Name: JONISHA LEPPO MRN: [id number] Epilepsy Attending: Charlsie Quest Referring Physician/Provider: Dr Milon Dikes Date: 10/11/2023 Duration: 34.22 mins Patient history: 67yo F with ams getting eeg to evaluate for seizure Level of alertness: Awake AEDs during EEG study: None Technical aspects: This EEG study was done with scalp electrodes positioned according to the 10-20 International system of electrode placement. Electrical activity was reviewed with band pass filter of 1-70Hz , sensitivity of 7 uV/mm, display speed of 62mm/sec with a 60Hz  notched filter applied as appropriate. EEG data were recorded continuously and digitally stored.  Video monitoring was available and reviewed as appropriate. Description: No clear posterior dominant rhythm was seen. EEG showed continuous generalized predominantly 7 to 6 Hz theta slowing.  Hyperventilation and photic stimulation were not performed.   ABNORMALITY - Continuous slow, generalized IMPRESSION: This study is suggestive of moderate diffuse encephalopathy. No seizures or epileptiform discharges were seen throughout the recording Priyanka Annabelle Harman   EEG adult Result Date: 10/09/2023 Jefferson Fuel, MD     10/09/2023  2:07 PM Routine EEG Report DANNIELA PALLANES is a 67 y.o. female with a history of altered mental status who is undergoing an EEG to evaluate for seizures. Report: This EEG was acquired with electrodes placed according to the International 10-20 electrode system (including Fp1, Fp2, F3, F4, C3, C4, P3, P4, O1, O2, T3, T4, T5, T6, A1, A2, Fz, Cz, Pz). The following electrodes were missing or displaced: none. The occipital dominant rhythm was 6-7 Hz. This activity is reactive to stimulation. Drowsiness was manifested by background fragmentation; deeper stages of sleep were identified by K complexes and sleep spindles. There was no focal slowing. There were no interictal epileptiform discharges. There were no electrographic seizures identified. There was no abnormal response to photic stimulation or hyperventilation. Impression and clinical correlation: This EEG was obtained while awake and asleep and is abnormal due to mild diffuse slowing indicative of global cerebral dysfunction. Epileptiform abnormalities were not seen during this recording. Bing Neighbors, MD [facility] [phone] If 7pm- 7am, please page neurology on call as listed in AMION.   DG FL GUIDED LUMBAR PUNCTURE Result Date: 10/08/2023 CLINICAL DATA:  Provided history: Rapidly progressive dementia. Additional history: Progressively worsening altered mental status with hallucinations, rapid mental decline. Request received for fluoroscopically-guided lumbar puncture. EXAM: LUMBAR PUNCTURE UNDER FLUOROSCOPY PROCEDURE: Due to the patient's altered mental status, Lynnette Caffey, PA-C obtained  informed consent from Drucilla Dipietrantonio (the patient's son) via telephone prior to procedure. This process included a discussion of procedure risks, including but not limited to bleeding, infection, injury to regional structures, post-procedure headache and non-diagnostic procedure. Verbal consent was given. Jorge Ny (radiology technologist) served as a witness. The patient was positioned prone on the fluoroscopy table. An appropriate skin entry site was determined under fluoroscopy and marked. The operator donned sterile gloves and a mask. The lower back was prepped and draped in the usual sterile fashion. Local anesthesia was provided with 1% lidocaine. Under intermittent fluoroscopy, lumbar puncture was performed at the L2-L3 level using a 20 gauge spinal needle with return of clear/colorless CSF and an opening pressure of 15 cm water. 12 mL of CSF were collected for laboratory studies. The inner stylet was replaced within the needle and the needle was removed in its entirety. A dressing was applied at the skin entry site. The patient tolerated the procedure well and no immediate  post-procedure complication was apparent. The procedure was performed by Lynnette Caffey, PA-C, supervised by Dr. Jackey Loge. FLUOROSCOPY: Radiation Exposure Index (as provided by the fluoroscopic device): 21.70 mGy Kerma IMPRESSION: 1. Technically successful fluoroscopically-guided L2-L3 lumbar puncture. 2. Opening pressure: 15 cm water. 3. 12 mL of CSF collected and sent for laboratory studies. 4. No immediate post-procedure complication. Electronically Signed   By: Jackey Loge D.O.   On: 10/08/2023 13:25   MR BRAIN WO CONTRAST Result Date: 10/07/2023 CLINICAL DATA:  Acute neurologic deficit EXAM: MRI HEAD WITHOUT CONTRAST TECHNIQUE: Multiplanar, multiecho pulse sequences of the brain and surrounding structures were obtained without intravenous contrast. COMPARISON:  None Available. FINDINGS: Brain: No acute infarct, mass  effect or extra-axial collection. No chronic microhemorrhage or siderosis. There is confluent hyperintense T2-weighted signal within the superior bihemispheric white matter. Parenchymal volume and CSF spaces are normal. The midline structures are normal. Vascular: Normal flow voids. Skull and upper cervical spine: Normal marrow signal. Sinuses/Orbits: Negative. Other: None IMPRESSION: 1. No acute intracranial abnormality. 2. Confluent hyperintense T2-weighted signal within the superior bihemispheric white matter, most commonly indicating chronic microvascular ischemia. Electronically Signed   By: Deatra Robinson M.D.   On: 10/07/2023 04:05   CT HEAD WO CONTRAST ( ) Result Date: 10/02/2023 CLINICAL DATA:  Nonfocal confusion.  Slow response to questioning. EXAM: CT HEAD WITHOUT CONTRAST TECHNIQUE: Contiguous axial images were obtained from the base of the skull through the vertex without intravenous contrast. RADIATION DOSE REDUCTION: This exam was performed according to the departmental dose-optimization program which includes automated exposure control, adjustment of the mA and/or kV according to patient size and/or use of iterative reconstruction technique. COMPARISON:  07/10/2015 in 10/21/2004. FINDINGS: Brain: No evidence of acute infarction, hemorrhage, hydrocephalus, extra-axial collection or mass effect. Ovoid mass in the right lateral ventricle, just anterior and superior to the foramen Monro, 11 mm in size and in retrospect present since 2005. There is mild cerebral volume loss for age. Vascular: No hyperdense vessel or unexpected calcification. Skull: Normal. Negative for fracture or focal lesion. Sinuses/Orbits: No acute finding. IMPRESSION: 1. No acute finding. 2. 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 Electronically Signed   By: Tiburcio Pea M.D.   On: 10/02/2023 04:17   DG Chest Portable 1 View Result Date:  10/02/2023 CLINICAL DATA:  COPD exacerbation and confusion. EXAM: PORTABLE CHEST 1 VIEW COMPARISON:  September 23, 2023 FINDINGS: The heart size and mediastinal contours are within normal limits. Mild, predominantly stable areas of atelectasis and/or infiltrate are seen along the periphery of the right lung. Mild atelectatic changes are also suspected within the left lung base. No pleural effusion or pneumothorax is identified. Multilevel degenerative changes seen throughout the thoracic spine IMPRESSION: Mild, predominantly stable areas of atelectasis and/or infiltrate along the periphery of the right lung. Electronically Signed   By: Aram Candela M.D.   On: 10/02/2023 04:00   ECHOCARDIOGRAM COMPLETE Result Date: 09/24/2023    ECHOCARDIOGRAM REPORT   Patient Name:   AMANNI OSTHOFF [facility] Date of Exam: 09/24/2023 Medical Rec #:  [id number]         Height:       61.0 in Accession #:    [id number]        Weight:       170.0 lb Date of Birth:  29-Dec-1955         BSA:          1.763 m Patient Age:  67 years          BP:           137/78 mmHg Patient Gender: F                 HR:           96 bpm. Exam Location:  [facility] Procedure: 2D Echo, Cardiac Doppler and Color Doppler Indications:     CHF--acute diastolic I50.31  History:         Patient has no prior history of Echocardiogram examinations.                  COPD; Risk Factors:Diabetes and Hypertension.  Sonographer:     Cristela Blue Referring Phys:  [id number] Andris Baumann Diagnosing Phys: Debbe Odea MD  Sonographer Comments: Technically challenging study due to limited acoustic windows and no apical window. Image acquisition challenging due to COPD. IMPRESSIONS  1. Left ventricular ejection fraction, by estimation, is 55 to 60%. The left ventricle has normal function. The left ventricle has no regional wall motion abnormalities. Left ventricular diastolic parameters are indeterminate.  2. Right ventricular systolic function is normal. The right ventricular  size is normal.  3. The mitral valve is normal in structure. No evidence of mitral valve regurgitation.  4. The aortic valve is grossly normal. Aortic valve regurgitation is not visualized.  5. The inferior vena cava is normal in size with greater than 50% respiratory variability, suggesting right atrial pressure of 3 mmHg. FINDINGS  Left Ventricle: Left ventricular ejection fraction, by estimation, is 55 to 60%. The left ventricle has normal function. The left ventricle has no regional wall motion abnormalities. The left ventricular internal cavity size was normal in size. There is  no left ventricular hypertrophy. Left ventricular diastolic parameters are indeterminate. Right Ventricle: The right ventricular size is normal. No increase in right ventricular wall thickness. Right ventricular systolic function is normal. Left Atrium: Left atrial size was normal in size. Right Atrium: Right atrial size was normal in size. Pericardium: There is no evidence of pericardial effusion. Mitral Valve: The mitral valve is normal in structure. No evidence of mitral valve regurgitation. Tricuspid Valve: The tricuspid valve is normal in structure. Tricuspid valve regurgitation is trivial. Aortic Valve: The aortic valve is grossly normal. Aortic valve regurgitation is not visualized. Pulmonic Valve: The pulmonic valve was normal in structure. Pulmonic valve regurgitation is not visualized. Aorta: The aortic root is normal in size and structure. Venous: The inferior vena cava is normal in size with greater than 50% respiratory variability, suggesting right atrial pressure of 3 mmHg. IAS/Shunts: No atrial level shunt detected by color flow Doppler.  LEFT VENTRICLE PLAX 2D LVIDd:         3.60 cm LVIDs:         2.40 cm LV PW:         1.30 cm LV IVS:        0.90 cm LVOT diam:     2.00 cm LVOT Area:     3.14 cm  LEFT ATRIUM         Index LA diam:    3.20 cm 1.82 cm/m   AORTA Ao Root diam: 2.50 cm TRICUSPID VALVE TR Peak grad:   7.8  mmHg TR Vmax:        140.00 cm/s  SHUNTS Systemic Diam: 2.00 cm Debbe Odea MD Electronically signed by Debbe Odea MD Signature Date/Time: 09/24/2023/11:57:16 AM    Final    CT  Angio Chest Pulmonary Embolism (PE) W or WO Contrast Result Date: 09/24/2023 CLINICAL DATA:  Shortness of breath EXAM: CT ANGIOGRAPHY CHEST WITH CONTRAST TECHNIQUE: Multidetector CT imaging of the chest was performed using the standard protocol during bolus administration of intravenous contrast. Multiplanar CT image reconstructions and MIPs were obtained to evaluate the vascular anatomy. RADIATION DOSE REDUCTION: This exam was performed according to the departmental dose-optimization program which includes automated exposure control, adjustment of the mA and/or kV according to patient size and/or use of iterative reconstruction technique. CONTRAST:  75mL OMNIPAQUE IOHEXOL 350 MG/ML SOLN COMPARISON:  06/07/2023 FINDINGS: Cardiovascular: No filling defects in the pulmonary arteries to suggest pulmonary emboli. Heart is normal size. Aorta is normal caliber. Scattered coronary artery and aortic calcifications. Mediastinum/Nodes: No mediastinal, hilar, or axillary adenopathy. Trachea and esophagus are unremarkable. Thyroid unremarkable. Lungs/Pleura: Suspicious spiculated appearing pulmonary nodule in the inferior right upper lobe measures 1.6 cm currently compared to 1.3 cm previously. Peripheral ground-glass nodular and airspace opacities in the right upper lobe and right lower lobe as well as left lower lobe and lingula. No effusions. Upper Abdomen: No acute findings Musculoskeletal: Chest wall soft tissues are unremarkable. No acute bony abnormality. Review of the MIP images confirms the above findings. IMPRESSION: No evidence of pulmonary embolus. Patchy bilateral ground-glass and nodular opacities, right greater than left. Appearance is concerning for pneumonia. Enlarging spiculated nodule in the inferior right upper lobe  now 1.6 cm compared to 1.3 cm previously. Recommend further evaluation with PET CT. Coronary artery disease. Aortic Atherosclerosis (ICD10-I70.0). Electronically Signed   By: Charlett Nose M.D.   On: 09/24/2023 01:45   DG Chest 2 View Result Date: 09/23/2023 CLINICAL DATA:  Shortness of breath. EXAM: CHEST - 2 VIEW COMPARISON:  June 07, 2023. FINDINGS: Stable cardiomediastinal silhouette. Mild central pulmonary vascular congestion is noted with possible bilateral pulmonary edema. Possible mild right basilar atelectasis with small pleural effusion. Continued presence of right midlung nodule concerning for neoplasm. Bony thorax is unremarkable. IMPRESSION: Continued presence of right midlung nodule concerning for neoplasm. PET scan is recommended for further evaluation as originally recommended on CT scan of June 07, 2023. Mild central pulmonary vascular congestion is noted with possible bilateral pulmonary edema. Mild right basilar atelectasis is noted with small pleural effusion. Electronically Signed   By: Lupita Raider M.D.   On: 09/23/2023 17:35    Michaelyn Barter, MD   10/14/2023 3:54 PM

## 2023-10-14 NOTE — Evaluation (Addendum)
Clinical/Bedside Swallow Evaluation Patient Details  Name: Michele House MRN: [id number] Date of Birth: 06-01-1956  Today's Date: 10/14/2023 Time: SLP Start Time (ACUTE ONLY): 0813 SLP Stop Time (ACUTE ONLY): 0823 SLP Time Calculation (min) (ACUTE ONLY): 10 min  Past Medical History:  Past Medical History:  Diagnosis Date   Acid reflux    COPD (chronic obstructive pulmonary disease) (HCC)    Diabetes mellitus without complication (HCC)    Hyperlipidemia    Hypertension    Thyroid disease    Past Surgical History:  Past Surgical History:  Procedure Laterality Date   appendectomy     APPENDECTOMY     BREAST BIOPSY Right    CORE W/CLIP - NEG   TOTAL VAGINAL HYSTERECTOMY     TUMOR REMOVAL     benign;stomach   HPI:  Pt is a 67 y/o F admitted on 10/02/23 after presenting with acute on chronic respiratory failure with hypoxia, COPD exacerbation, PNA, UTI, & encephalopathy. PMH: COPD, chronic respiratory failure on 2L,HTN, DM2, HFpEF. Head CT/CTA, 12/11, "CT HEAD:     1. 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 No  associated hemorrhage evident by CT. No significant regional mass  effect.  2. No other new acute intracranial abnormality.  3. 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 No hydrocephalus.  4. Age-related cerebral atrophy with mild chronic microvascular  ischemic disease.     CTA HEAD AND NECK:     1. Negative CTA for large vessel occlusion or other emergent  finding.  2. 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  3. 1.7 cm spiculated right upper lobe pulmonary nodule. Again,  further evaluation with dedicated PET-CT recommended."    Assessment / Plan / Recommendation  Clinical Impression  Pt seen for clinical swallowing evaluation. No previous swallowing evaluation noted on chart  review. Pt on Dysphagia 3, Thin Liquids earlier this admission. Pt now NPO due to failed dysphagia screening. Pt alert, slow to respond. Flat affect. Mild dyspnea noted at rest and did not change with POs. On 1L/min O2 via [place]. Oral motor examination unremarkable with exception of missing upper dentition and sparse lower dentition as well as mild xerostomia which improved with POs. Pt reports owning an upper denture which was not present for today's evaluation. Pt demonstrated s/sx mild-moderate oral dysphagia c/b prolonged and inefficient mastication of solids which is likely secondary to dental status and exacerbated by mental status. Pt demonstrated an intact pharyngeal swallow per clinical assessment. Recommend initiation of a Dysphagia 2 Diet with Thin Liquids with safe swallowing strategies/aspiration precautions as outlined below. SLP to f/u per POC for diet tolerance and trials of upgraded tetures pending improvements in mental status. SLP Visit Diagnosis: Dysphagia, oral phase (R13.11)    Aspiration Risk  Mild aspiration risk    Diet Recommendation Dysphagia 2 (Fine chop);No mixed consistencies    Liquid Administration via: Spoon;Cup;Straw Medication Administration: Whole meds with puree (vs crushed) Supervision: Staff to assist with self feeding;Full supervision/cueing for compensatory strategies Compensations: Minimize environmental distractions;Slow rate;Small sips/bites Postural Changes: Seated upright at 90 degrees;Remain upright for at least 30 minutes after po intake    Other  Recommendations Oral Care Recommendations: Oral care QID;Staff/trained caregiver to provide oral care    Recommendations for follow up therapy are one component of a multi-disciplinary discharge planning process, led by the attending  physician.  Recommendations may be updated based on patient status, additional functional criteria and insurance authorization.  Follow up Recommendations Skilled nursing-short  term rehab (<3 hours/day)         Functional Status Assessment Patient has had a recent decline in their functional status and demonstrates the ability to make significant improvements in function in a reasonable and predictable amount of time.  Frequency and Duration min 2x/week  2 weeks       Prognosis Prognosis for improved oropharyngeal function: Fair Barriers to Reach Goals: Cognitive deficits      Swallow Study   General Date of Onset: 10/02/23 (adm date) HPI: Pt is a 67 y/o F admitted on 10/02/23 after presenting with acute on chronic respiratory failure with hypoxia, COPD exacerbation, PNA, UTI, & encephalopathy. PMH: COPD, chronic respiratory failure on 2L,HTN, DM2, HFpEF. Head CT/CTA, 12/11, "CT HEAD:     1. 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 No  associated hemorrhage evident by CT. No significant regional mass  effect.  2. No other new acute intracranial abnormality.  3. 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 No hydrocephalus.  4. Age-related cerebral atrophy with mild chronic microvascular  ischemic disease.     CTA HEAD AND NECK:     1. Negative CTA for large vessel occlusion or other emergent  finding.  2. 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  3. 1.7 cm spiculated right upper lobe pulmonary nodule. Again,  further evaluation with dedicated PET-CT recommended." Type of Study: Bedside Swallow Evaluation Previous Swallow Assessment: none Diet Prior to this Study: NPO (on Dysphagia 3 and Thin earlier this admission) Temperature Spikes Noted: No Respiratory Status: Nasal cannula (1L/min) History of Recent Intubation: No Behavior/Cognition: Alert;Confused;Requires cueing Oral Cavity Assessment: Dry Oral Care Completed by SLP: Yes Oral Cavity - Dentition: Edentulous;Poor  condition;Missing dentition;Dentures, not available (edentulous upper arch; reports upper denture; not present; several missing teeth on lower arch) Vision: Functional for self-feeding Self-Feeding Abilities: Total assist Patient Positioning: Upright in bed Baseline Vocal Quality: Normal Volitional Cough: Strong Volitional Swallow: Able to elicit    Oral/Motor/Sensory Function Overall Oral Motor/Sensory Function: Within functional limits   Ice Chips Ice chips: Within functional limits Presentation: Spoon   Thin Liquid Thin Liquid: Within functional limits Presentation: Cup;Straw    Nectar Thick Nectar Thick Liquid: Not tested   Honey Thick Honey Thick Liquid: Not tested   Puree Puree: Within functional limits Presentation: Spoon   Solid     Solid: Impaired Oral Phase Impairments: Impaired mastication Oral Phase Functional Implications: Impaired mastication Pharyngeal Phase Impairments:  (WFL)      Alessandra Bevels Kalmen Lollar 10/14/2023,10:46 AM

## 2023-10-14 NOTE — Evaluation (Signed)
Speech Language Pathology Evaluation Patient Details Name: Michele House MRN: [id number] DOB: Nov 16, 1955 Today's Date: 10/14/2023 Time: 5284-1324 SLP Time Calculation (min) (ACUTE ONLY): 12 min  Problem List:  Patient Active Problem List   Diagnosis Date Noted   Hypotension 10/11/2023   Hypernatremia 10/11/2023   Uncontrolled type 2 diabetes mellitus with hypoglycemia, without long-term current use of insulin (HCC) 10/08/2023   Acute metabolic encephalopathy 10/06/2023   Altered mental status 10/02/2023   UTI (urinary tract infection) 10/02/2023   (HFpEF) heart failure with preserved ejection fraction (HCC) 10/02/2023   Pneumonia 09/24/2023   COPD (chronic obstructive pulmonary disease) (HCC) 09/24/2023   Acute CHF (congestive heart failure) (HCC) 09/23/2023   COPD exacerbation (HCC) 06/07/2023   Acute on chronic respiratory failure with hypoxia (HCC) 06/07/2023   Lung nodule 06/07/2023   Allergic reaction 06/07/2023   Chest discomfort 10/12/2013   Tobacco use disorder 10/12/2013   Essential hypertension 10/12/2013   Obesity 10/12/2013   Type 2 diabetes mellitus with obesity (HCC) 10/12/2013   Past Medical History:  Past Medical History:  Diagnosis Date   Acid reflux    COPD (chronic obstructive pulmonary disease) (HCC)    Diabetes mellitus without complication (HCC)    Hyperlipidemia    Hypertension    Thyroid disease    Past Surgical History:  Past Surgical History:  Procedure Laterality Date   appendectomy     APPENDECTOMY     BREAST BIOPSY Right    CORE W/CLIP - NEG   TOTAL VAGINAL HYSTERECTOMY     TUMOR REMOVAL     benign;stomach   HPI:  Pt is a 67 y/o F admitted on 10/02/23 after presenting with acute on chronic respiratory failure with hypoxia, COPD exacerbation, PNA, UTI, & encephalopathy. PMH: COPD, chronic respiratory failure on 2L,HTN, DM2, HFpEF. Head CT/CTA, 12/11, "CT HEAD:     1. 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 No  associated hemorrhage evident by CT. No significant regional mass  effect.  2. No other new acute intracranial abnormality.  3. 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 No hydrocephalus.  4. Age-related cerebral atrophy with mild chronic microvascular  ischemic disease.     CTA HEAD AND NECK:     1. Negative CTA for large vessel occlusion or other emergent  finding.  2. 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  3. 1.7 cm spiculated right upper lobe pulmonary nodule. Again,  further evaluation with dedicated PET-CT recommended."   Assessment / Plan / Recommendation Clinical Impression  Pt seen for cognitive-linguistic assessment. Assessment completed via informal means. Noted pt with hx of cognitive deficits; however, no family present to provide additional details. Pt with cognitive-communication deficits affecting attention, memory, problem solving, insight, complex comprehension, pragmatic language, complex expression. Pt's speech is fluent, tangential at times. No s/sx anomia. Reduced initiation and length of utterance noted. Some perseveration appreciated. Pt slow to respond and sometimes stares blankly. Recommend further functional cognitive-linguistic evaluation. This may be completed at next level of care as well.    SLP Assessment  SLP Recommendation/Assessment: Patient needs continued Speech Lanaguage Pathology Services SLP Visit Diagnosis: Cognitive communication deficit (R41.841)    Recommendations for follow up therapy are one component of a multi-disciplinary discharge planning process, led by the attending physician.  Recommendations may be updated based on patient status, additional functional criteria  and insurance authorization.    Follow Up Recommendations  Skilled nursing-short term rehab (<3 hours/day)     Assistance Recommended at Discharge  Frequent or constant Supervision/Assistance  Functional Status Assessment Patient has had a recent decline in their functional status and demonstrates the ability to make significant improvements in function in a reasonable and predictable amount of time.  Frequency and Duration min 2x/week  2 weeks      SLP Evaluation Cognition  Overall Cognitive Status: No family/caregiver present to determine baseline cognitive functioning Arousal/Alertness: Awake/alert Orientation Level: Oriented to person;Oriented to place;Disoriented to time;Disoriented to situation Day of Week: Incorrect Attention: Sustained Sustained Attention: Impaired Memory: Impaired Memory Impairment: Storage deficit;Retrieval deficit;Decreased recall of new information Awareness: Impaired Problem Solving: Impaired Problem Solving Impairment: Functional basic Behaviors: Perseveration Safety/Judgment: Impaired       Comprehension  Auditory Comprehension Overall Auditory Comprehension: Impaired Yes/No Questions: Impaired Complex Questions: 75-100% accurate Commands: Impaired Complex Commands: 50-74% accurate Conversation: Simple Interfering Components: Attention;Working Radio broadcast assistant: Biomedical scientist Expression Overall Verbal Expression: Impaired Initiation: Impaired Automatic Speech: Name;Social Response Level of Generative/Spontaneous Verbalization: Phrase;Sentence Repetition: No impairment Naming: No impairment (basic objects) Pragmatics: Impairment Impairments: Topic maintenance;Abnormal affect;Eye contact Interfering Components: Attention Written Expression Written Expression: Not tested   Oral / Motor  Oral Motor/Sensory Function Overall Oral Motor/Sensory Function: Within functional limits Motor Speech Overall Motor Speech: Appears within functional limits for tasks assessed Respiration:  Within functional limits Phonation: Normal Resonance: Within functional limits Articulation: Within functional limitis Intelligibility: Intelligible           [name], M.S., CCC-SLP Speech-Language Pathologist [facility] [phone] Arnette Felts)  [name] 10/14/2023, 10:53 AM

## 2023-10-14 NOTE — Progress Notes (Signed)
NEUROLOGY CONSULT FOLLOW UP NOTE   Date of service: October 14, 2023 Patient Name: Michele House MRN:  [id number] DOB:  Jan 24, 1956  Interval Hx/subjective  Patient seen and examined. MRI obtained yesterday revealed bilateral hemispheric strokes  I was speak with her daughter Gunnar Fusi in detail. She explains that the patient was fine in terms of her mentation and memory up until April of this year.  She had very minimal episodes of forgetfulness etc. that was not of any concern to family.  The daughter had some medical issues and the patient was on top of checking her daughter with calls every day since they lived apart-daughter lives in [place], patient lives in [place]. After about a few months, the patient stopped calling the daughter and started to appear somewhat out of sorts when they talked.  Eventually the daughter came by and noted that she was very deconditioned, very out of breath and also acting out of sorts at times.  She was brought in for first admission on 09/23/2023 and discharged on 09/26/2023 after treatment of COPD exacerbation and acute on chronic respiratory failure but then was brought back again on 10/02/2023 with generalized malaise and weakness, increasing shortness of breath, chest pain and chest discomfort as well as changes in mental status.   Vitals   Vitals:   10/13/23 1608 10/13/23 1937 10/14/23 0359 10/14/23 0756  BP: (!) 118/54 (!) 114/59 105/73 118/63  Pulse: 90 91 82 60  Resp: 18 18 16 18   Temp: (!) 97.4 F (36.3 C) 97.8 F (36.6 C) 97.7 F (36.5 C) 97.6 F (36.4 C)  TempSrc: Oral Oral Oral Oral  SpO2: 95% 98% 99% 97%  Weight:      Height:         Body mass index is 30.88 kg/m.  Physical Exam   General: Well-developed well-nourished: Slightly unkempt comfortably laying in bed HEENT: Normocephalic atraumatic Lungs: Scattered rales Cardiovascular: Regular rate rhythm Abdomen nondistended nontender Neurological exam She is awake,  again staring at the ceiling, she is able to participate in the exam  She is awake alert oriented to the fact that she is in the hospital She could not tell me the current month Her speech was clear She was able to name simple objects consistently She was able to follow simple commands Cranial nerves: Pupils are equal round reactive light, she does not look to either direction upon asking her to but blinks to threat from both sides, face appears symmetric, tongue and palate midline. Motor examination: She is able to raise her left arm against gravity with a drift and left leg against gravity with a drift.  Refuses to move her right upper or lower extremity.  To noxious simulation, she does withdrawal right upper and lower extremity. Sensation: Intact all over to light touch Coordination difficult to assess  1a Level of Conscious.: 0 1b LOC Questions: 2 1c LOC Commands: 0 2 Best Gaze: 1 3 Visual: 0 4 Facial Palsy: 0 5a Motor Arm - left: 2 5b Motor Arm - Right: 3 6a Motor Leg - Left: 3 6b Motor Leg - Right: 3 7 Limb Ataxia: 0 8 Sensory: 0 9 Best Language: 0 10 Dysarthria: 0 11 Extinct. and Inatten.: 0 TOTAL: 14   Medications  Current Facility-Administered Medications:    acetaminophen (TYLENOL) tablet 650 mg, 650 mg, Oral, Q6H PRN, Floydene Flock, MD, 650 mg at 10/10/23 0601   clopidogrel (PLAVIX) tablet 75 mg, 75 mg, Oral, Daily, Milon Dikes, MD, 75 mg  at 10/14/23 1009   dextromethorphan-guaiFENesin (MUCINEX DM) 30-600 MG per 12 hr tablet 1 tablet, 1 tablet, Oral, BID PRN, Floydene Flock, MD   enoxaparin (LOVENOX) injection 40 mg, 40 mg, Subcutaneous, Daily, Wieting, Richard, MD, 40 mg at 10/14/23 1009   insulin aspart (novoLOG) injection 0-15 Units, 0-15 Units, Subcutaneous, TID WC, Alford Highland, MD, 3 Units at 10/14/23 0805   insulin aspart (novoLOG) injection 0-5 Units, 0-5 Units, Subcutaneous, QHS, Wieting, Richard, MD, 2 Units at 10/11/23 2115   insulin  glargine-yfgn (SEMGLEE) injection 15 Units, 15 Units, Subcutaneous, QHS, Amin, Sumayya, MD   ipratropium-albuterol (DUONEB) 0.5-2.5 (3) MG/3ML nebulizer solution 3 mL, 3 mL, Nebulization, Q4H PRN, Floydene Flock, MD   levothyroxine (SYNTHROID) tablet 100 mcg, 100 mcg, Oral, Q0600, Floydene Flock, MD, 100 mcg at 10/13/23 0558   loratadine (CLARITIN) tablet 10 mg, 10 mg, Oral, Daily, Floydene Flock, MD, 10 mg at 10/14/23 1009   ondansetron (ZOFRAN) tablet 4 mg, 4 mg, Oral, Q6H PRN **OR** ondansetron (ZOFRAN) injection 4 mg, 4 mg, Intravenous, Q6H PRN, Floydene Flock, MD   Oral care mouth rinse, 15 mL, Mouth Rinse, PRN, Renae Gloss, Richard, MD   QUEtiapine (SEROQUEL) tablet 12.5 mg, 12.5 mg, Oral, QHS PRN, Renae Gloss, Richard, MD, 12.5 mg at 10/13/23 0154   sertraline (ZOLOFT) tablet 25 mg, 25 mg, Oral, Daily, Wieting, Richard, MD, 25 mg at 10/14/23 1009  Labs and Diagnostic Imaging   CBC:  Recent Labs  Lab 10/11/23 0432 10/14/23 0513  WBC 9.2 10.2  HGB 13.1 12.1  HCT 41.2 37.4  MCV 90.4 90.3  PLT 223 130*   LDL 38 A1c was 7.14 months ago.  Basic Metabolic Panel:  Lab Results  Component Value Date   NA 142 10/14/2023   K 3.9 10/14/2023   CO2 29 10/14/2023   GLUCOSE 172 (H) 10/14/2023   BUN 44 (H) 10/14/2023   CREATININE 0.91 10/14/2023   CALCIUM 8.5 (L) 10/14/2023   GFRNONAA >60 10/14/2023   GFRAA >60 08/19/2017   MRI Brain12/4/24 1. No acute intracranial abnormality. 2. Confluent hyperintense T2-weighted signal within the superior bihemispheric white matter, most commonly indicating chronic microvascular ischemia.   MR brain repeated 10/13/23 IMPRESSION: 1. Multiple acute infarcts in the high bilateral posterior frontal and parietal lobes, potentially watershed territory. Mild associated petechial hemorrhage. 2. 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  CT angio head and neck  10/13/23  with no emergent LVO.  Atheromatous changes  about the carotid bifurcations bilaterally up to 50% stenosis.  rEEG 12/6 showed mild diffuse slowing without epileptiform abnormalities. Repeat EEG on 10/11/23 also suggestive of diffuse slowing without seizures or epileptiform discharges.    Assessment  67 year old woman with past medical history significant for COPD, chronic respiratory failure on 2 L of home O2, hypertension, type 2 diabetes, heart failure with preserved ejection fraction who presented with acute on chronic respiratory failure with hypoxia, COPD exacerbation, pneumonia, and UTI. Neurology was consulted for altered mental status.  Due to her acute delirium and altered mental status and the fact that she had pretty rapid decline in her cognition, LP was done which was essentially unremarkable but due to high suspicion of this being some sort of an autoimmune/paraneoplastic process because of CTA of the chest with right upper lobe spiculated nodule, she was given 5 days of steroids which has really not made much of a change. Repeat imaging with MRI because of more pronounced right-sided weakness was performed-the  MRI shows bilateral frontoparietal infarcts almost in a watershed pattern without any significant stenosis in the head and neck vessels Workup for reversible causes of dementia has been unremarkable Her pulmonary nodule has increased in size from 3 months ago Now she has strokes with unclear etiology-no evidence of A-fib, no significant narrowing in the blood vessels of head and neck which makes you wonder if she has hypercoagulability from an underlying malignant process and mentation is also related to a paraneoplastic type of encephalitis  She has had a spinal tap done which has been bland. Send out labs for autoimmune encephalopathy in the CSF and serum are pending. 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 For stroke prevention-started her on Plavix.  She is allergic to aspirin.  I would recommend involving pulmonology or oncology to pursue the pulmonary nodule further as that might give some answers into her overall clinical picture and prognosis.  If it is deemed that the etiology truly is hypercoagulability, then she will need anticoagulation and not antiplatelets for stroke prevention.  Plan discussed with Dr. Nelson Chimes  Plan also discussed with patient's daughter over the phone.  -- Milon Dikes, MD Neurologist [facility]

## 2023-10-14 NOTE — Progress Notes (Signed)
Progress Note   Patient: Michele House UUV:[id number] DOB: May 03, 1956 DOA: 10/02/2023     12 DOS: the patient was seen and examined on 10/14/2023   Brief hospital course: Taken from H&P.  Michele House is a 67 y.o. female with medical history significant of COPD, chronic respiratory failure on 2 L, hypertension, type 2 diabetes, HFpEF presenting with acute on chronic respiratory failure with hypoxia, COPD exacerbation, pneumonia, UTI, encephalopathy.  Patient reports generalized malaise over the past 3 to 4 days.  Patient noted to have been recently admitted in the hospital November 21 through November 24 for similar issues including COPD exacerbation.  Patient reports not taking her medication since discharge.  Presented to the ER afebrile, heart rate 100s, BP stable.  Satting 96% on 3 L nasal cannula.  White count 23.4, hemoglobin 15, platelets 401, lactate 1.6, procalcitonin less than 0.1, troponin 18, urinalysis indicative of infection.  Urine drug screen negative.  Chest x-ray with a right sided pneumonia.  BNP within normal limits.  Creatinine 1.03.  Glucose 368.EKG sinus tachycardia.  Admitted for concern of pneumonia/COPD exacerbation and UTI. Started on Rocephin and azithromycin.  12/1: Vital stable, respiratory viral panel negative, procalcitonin negative, preliminary blood cultures negative.  Urine cultures are ordered as add-on, ammonia levels mildly elevated at 39, strep pneumo negative, CBG elevated at 244, history of enlarging pulmonary nodule with recommendations for PET/CT during most recent admission which has not been done yet, if video bronchoscopy was scheduled for 10/13/2023 as outpatient.  Unable to take care of himself and son cannot provide the required care as she required 24-hour supervision due to worsening dementia.  PT is recommending SNF.  12/2: Hemodynamically stable, now on baseline oxygen of 2 L.  Needs SNF placement. Urine cultures pending.  12/3:  Blood pressure started trending up, restarting home Zestoretic.  Urine cultures with strep agalactiae, penicillin allergy noted, will continue with ceftriaxone for now.  Also started on Remeron for concern of worsening depression and unable to sleep at night  12/4.  Some very concerned about patient's mental status not being seen.  Patient receiving antibiotics already and should have improved by now.  Will hold Lipitor.  Restart low-dose Zoloft.  Discontinue Remeron and do Seroquel at night. 12/5.  Patient has some blurry vision and weakness and pain.  MRI of the brain negative for acute intracranial abnormality. 12/6.  Patient stated she slept well.  Patient able to lift up her arms up off the bed today.  Patient thought she heard her son's voice outside the room. 12/7.  Patient sitting up in chair when I saw her.  Was able to feed herself breakfast.  Felt okay.  Did not offer any complaints. 12/8.  Patient more confused today than yesterday.  Able to follow some simple commands but not as good of a conversation today as yesterday. 12/9.  Patient still confused today.  Does not follow simple commands today. 12/10.  Patient more talkative today but was looking up at the ceiling when she was talking with me.  She does not move her extremities to command but does move them on her own.  Physical therapy and Occupational Therapy did not see her move her right arm. 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  Mild associated petechial hemorrhage.  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 12/12: Patient with new stroke, CTA head and neck with no large vessel occlusion.  Patient had a recent echocardiogram which was normal. CSF cultures remain negative.  Lipid panel with increased triglyceride at 347, HDL 29 and LDL of  38.  CBC with mild thrombocytopenia at 130, BMP mostly unremarkable.  Patient with significant right-sided weakness. Concern of paraneoplastic versus hypercoagulability secondary to malignancy.  Patient missed her appointment for bronchoscopy and outpatient appointment for PET scan due to recurrent hospitalizations. Involving pulmonary and oncology for their input. Send out encephalitis panel still pending.    Assessment and Plan: * Acute metabolic encephalopathy Patient able to communicate better today.  Still having some right arm weakness, spontaneously did move her extremities for me this morning but not to command.  Repeat MRI today with multiple acute small infarcts involving bilateral frontal and parietal territory, a small petechial hemorrhage.   Discontinued Lipitor.  Patient has elevated vitamin B12, nonreactive RPR, TSH in the normal range.  Potentially has a progressive dementia versus limbic encephalitis versus autoimmune encephalitis versus leptomeningeal spread from spiculated nodule.  Lumbar puncture done 12/6 with testing sent to the [facility].  Started high-dose Solu-Medrol on 12/6.  Patient completed 5 days of high-dose Solu-Medrol (today will be day 5).  Repeat ABG does not show CO2 retention.  Repeat EEG does not show any seizure activity.    CVA (cerebral vascular accident) (HCC) Repeat MRI with bilateral stroke in watershed area. Significant bilateral weakness, right more than left. Neurology is on board. Concern of paraneoplastic versus hypercoagulable state with underlying malignancy ?? Lower extremity venous Doppler was ordered-if positive patient will need TEE to completely rule out PFO  Uncontrolled type 2 diabetes mellitus with hypoglycemia, without long-term current use of insulin (HCC) CBG elevated.  Patient completed 5-day course of high-dose steroid. -Increasing Semglee to 15 unit daily -Continue with SSI  Hypotension Blood pressure within goal.  Holding  antihypertensive medications  Hypernatremia Resolved. -Continue to monitor  Acute on chronic respiratory failure with hypoxia (HCC) Patient initially required 3 L now on 1 L.  Lung nodule 1.3 cm right upper lobe spiculated in nature.  Could be a primary lung cancerous process.  Will need outpatient PET scan and likely bronchoscopy for diagnosis.  Spoke with Dr. Karna Christmas today and she has missed a few outpatient appointments.  COPD exacerbation (HCC) Resolved  Pneumonia Completed 5 days of Rocephin and Zithromax  UTI (urinary tract infection) Streptococcus agalactiae growing out of urine culture.  Completed Rocephin  (HFpEF) heart failure with preserved ejection fraction (HCC) Chronic in nature.  Not in acute exacerbation.  No signs of heart failure currently.  2D echo 09/2023 showed LVEF 55 to 60%, no regional wall motion abnormalities    Essential hypertension Holding antihypertensive medications with hypotension.  Type 2 diabetes mellitus with obesity (HCC) Sugars high now that she is on high-dose Solu-Medrol.  BMI 30.88   Subjective: Patient was lying still and looking at the ceiling, able to answer orientation questions but very slowly.  Unable to move right upper and lower extremity, weaker left extremities.  Physical Exam: Vitals:   10/13/23 1608 10/13/23 1937 10/14/23 0359 10/14/23 0756  BP: (!) 118/54 (!) 114/59 105/73 118/63  Pulse: 90 91 82 60  Resp: 18 18 16 18   Temp: (!) 97.4 F (36.3 C) 97.8 F (36.6 C) 97.7 F (36.5 C) 97.6 F (36.4 C)  TempSrc: Oral Oral Oral Oral  SpO2: 95% 98% 99% 97%  Weight:  Height:       General.     In no acute distress. Pulmonary.  Lungs clear bilaterally, normal respiratory effort. CV.  Regular rate and rhythm, no JVD, rub or murmur. Abdomen.  Soft, nontender, nondistended, BS positive. CNS.  Alert and oriented with very slow response, unable to move right upper and lower extremity. Extremities.  No edema, no  cyanosis, pulses intact and symmetrical.  Data Reviewed: Prior data reviewed  Family Communication: Neurology discussed with daughter on phone.  Disposition: Status is: Inpatient Remains inpatient appropriate because: Severity of illness  Planned Discharge Destination: Rehab  DVT prophylaxis.  Lovenox Time spent: 50 minutes  This record has been created using Conservation officer, historic buildings. Errors have been sought and corrected,but may not always be located. Such creation errors do not reflect on the standard of care.   Author: Arnetha Courser, MD 10/14/2023 3:01 PM  For on call review [email or web address].

## 2023-10-14 NOTE — TOC Progression Note (Signed)
Transition of Care [facility]) - Progression Note    Patient Details  Name: Michele House MRN: [id number] Date of Birth: 1955/12/29  [facility] of [facility]) CM/SW Contact  Marlowe Sax, RN Phone Number: 10/14/2023, 4:35 PM  Clinical Narrative:     TOC continues to follow the patient, Not medically ready, will review bed offers once ready   Expected Discharge Plan: Skilled Nursing Facility Barriers to Discharge: Continued Medical Work up  Expected Discharge Plan and Services                                               Social Determinants of Health (SDOH) Interventions SDOH Screenings   Food Insecurity: Patient Unable To Answer (10/03/2023)  Recent Concern: Food Insecurity - Food Insecurity Present (09/24/2023)  Housing: High Risk (10/03/2023)  Transportation Needs: Patient Unable To Answer (10/03/2023)  Recent Concern: Transportation Needs - Unmet Transportation Needs (09/24/2023)  Utilities: Patient Unable To Answer (10/03/2023)  Tobacco Use: Medium Risk (10/02/2023)    Readmission Risk Interventions     No data to display

## 2023-10-14 NOTE — Consult Note (Signed)
PULMONOLOGY         Date: 10/14/2023,   MRN# [id number] Michele House 1956-01-19     AdmissionWeight: 74.1 kg                 CurrentWeight: 74.1 kg  Referring provider: Dr Nelson Chimes   CHIEF COMPLAINT:   Lung nodule suspicious for carcinoma   HISTORY OF PRESENT ILLNESS   Patient with COPD, dyslipidemia, HTN, thyroid dysfunction, GERD, She has 1.5cm RUL nodule suspicious for primary bronchogenic carcinoma. She needs to have tissue diagnosis. PCCM consultation for biopsy.    PAST MEDICAL HISTORY   Past Medical History:  Diagnosis Date   Acid reflux    COPD (chronic obstructive pulmonary disease) (HCC)    Diabetes mellitus without complication (HCC)    Hyperlipidemia    Hypertension    Thyroid disease      SURGICAL HISTORY   Past Surgical History:  Procedure Laterality Date   appendectomy     APPENDECTOMY     BREAST BIOPSY Right    CORE W/CLIP - NEG   TOTAL VAGINAL HYSTERECTOMY     TUMOR REMOVAL     benign;stomach     FAMILY HISTORY   Family History  Problem Relation Age of Onset   Heart attack Mother    Hypertension Mother    Breast cancer Sister 76   Breast cancer Maternal Aunt        40'S   Breast cancer Maternal Grandmother      SOCIAL HISTORY   Social History   Tobacco Use   Smoking status: Former    Current packs/day: 0.00    Types: Cigarettes    Quit date: 12/15/1985    Years since quitting: 37.8   Smokeless tobacco: Never  Substance Use Topics   Alcohol use: No   Drug use: No     MEDICATIONS    Home Medication:    Current Medication:  Current Facility-Administered Medications:    acetaminophen (TYLENOL) tablet 650 mg, 650 mg, Oral, Q6H PRN, Floydene Flock, MD, 650 mg at 10/10/23 0601   clopidogrel (PLAVIX) tablet 75 mg, 75 mg, Oral, Daily, Milon Dikes, MD, 75 mg at 10/14/23 1009   dextromethorphan-guaiFENesin (MUCINEX DM) 30-600 MG per 12 hr tablet 1 tablet, 1 tablet, Oral, BID PRN, Floydene Flock, MD    enoxaparin (LOVENOX) injection 40 mg, 40 mg, Subcutaneous, Daily, Wieting, Richard, MD, 40 mg at 10/14/23 1009   insulin aspart (novoLOG) injection 0-15 Units, 0-15 Units, Subcutaneous, TID WC, Wieting, Richard, MD, 8 Units at 10/14/23 1200   insulin aspart (novoLOG) injection 0-5 Units, 0-5 Units, Subcutaneous, QHS, Wieting, Richard, MD, 2 Units at 10/11/23 2115   insulin glargine-yfgn (SEMGLEE) injection 15 Units, 15 Units, Subcutaneous, QHS, Amin, Sumayya, MD   ipratropium-albuterol (DUONEB) 0.5-2.5 (3) MG/3ML nebulizer solution 3 mL, 3 mL, Nebulization, Q4H PRN, Floydene Flock, MD   levothyroxine (SYNTHROID) tablet 100 mcg, 100 mcg, Oral, Q0600, Floydene Flock, MD, 100 mcg at 10/13/23 0558   loratadine (CLARITIN) tablet 10 mg, 10 mg, Oral, Daily, Floydene Flock, MD, 10 mg at 10/14/23 1009   ondansetron (ZOFRAN) tablet 4 mg, 4 mg, Oral, Q6H PRN **OR** ondansetron (ZOFRAN) injection 4 mg, 4 mg, Intravenous, Q6H PRN, Floydene Flock, MD   Oral care mouth rinse, 15 mL, Mouth Rinse, PRN, Renae Gloss, Richard, MD   QUEtiapine (SEROQUEL) tablet 12.5 mg, 12.5 mg, Oral, QHS PRN, Alford Highland, MD, 12.5 mg at 10/13/23 0154   sertraline (  ZOLOFT) tablet 25 mg, 25 mg, Oral, Daily, Wieting, Richard, MD, 25 mg at 10/14/23 1009    ALLERGIES   Penicillins, Aspirin, and Sulfa antibiotics     REVIEW OF SYSTEMS    Review of Systems:  Gen:  Denies  fever, sweats, chills weigh loss  HEENT: Denies blurred vision, double vision, ear pain, eye pain, hearing loss, nose bleeds, sore throat Cardiac:  No dizziness, chest pain or heaviness, chest tightness,edema Resp:   reports dyspnea chronically  Gi: Denies swallowing difficulty, stomach pain, nausea or vomiting, diarrhea, constipation, bowel incontinence Gu:  Denies bladder incontinence, burning urine Ext:   Denies Joint pain, stiffness or swelling Skin: Denies  skin rash, easy bruising or bleeding or hives Endoc:  Denies polyuria, polydipsia ,  polyphagia or weight change Psych:   Denies depression, insomnia or hallucinations   Other:  All other systems negative   VS: BP (!) 120/58 (BP Location: Left Arm)   Pulse 87   Temp 97.7 F (36.5 C) (Oral)   Resp 18   Ht 5' 0.98" (1.549 m)   Wt 74.1 kg   LMP 12/24/1998 Comment: prior to her hysterectomy  SpO2 98%   BMI 30.88 kg/m      PHYSICAL EXAM    GENERAL:NAD, no fevers, chills, no weakness no fatigue HEAD: Normocephalic, atraumatic.  EYES: Pupils equal, round, reactive to light. Extraocular muscles intact. No scleral icterus.  MOUTH: Moist mucosal membrane. Dentition intact. No abscess noted.  EAR, NOSE, THROAT: Clear without exudates. No external lesions.  NECK: Supple. No thyromegaly. No nodules. No JVD.  PULMONARY: decreased breath sounds with mild rhonchi worse at bases bilaterally.  CARDIOVASCULAR: S1 and S2. Regular rate and rhythm. No murmurs, rubs, or gallops. No edema. Pedal pulses 2+ bilaterally.  GASTROINTESTINAL: Soft, nontender, nondistended. No masses. Positive bowel sounds. No hepatosplenomegaly.  MUSCULOSKELETAL: No swelling, clubbing, or edema. Range of motion full in all extremities.  NEUROLOGIC: Cranial nerves II through XII are intact. No gross focal neurological deficits. Sensation intact. Reflexes intact.  SKIN: No ulceration, lesions, rashes, or cyanosis. Skin warm and dry. Turgor intact.  PSYCHIATRIC: Mood, affect within normal limits. The patient is awake, alert and oriented x 3. Insight, judgment intact.       IMAGING   CT chest -  Enlarging spiculated nodule in the inferior right upper lobe now 1.6 cm compared to 1.3 cm previously.   ASSESSMENT/PLAN   Lung nodule suspicious for primary bronchogenic carcinoma   1.5cm - RUL   -patient would require biopsy - will order bronchoscopy     -Reviewed risks/complications and benefits with patient, risks include infection, pneumothorax/pneumomediastinum which may require chest tube placement as  well as overnight/prolonged hospitalization and possible mechanical ventilation. Other risks include bleeding and very rarely death.  Patient understands risks and wishes to proceed.  Additional questions were answered, and patient is aware that post procedure patient will be going home with family and may experience cough with possible clots on expectoration as well as phlegm which may last few days as well as hoarseness of voice post intubation and mechanical ventilation.            Thank you for allowing me to participate in the care of this patient.   Patient/Family are satisfied with care plan and all questions have been answered.    Provider disclosure: Patient with at least one acute or chronic illness or injury that poses a threat to life or bodily function and is being managed actively during this  encounter.  All of the below services have been performed independently by signing provider:  review of prior documentation from internal and or external health records.  Review of previous and current lab results.  Interview and comprehensive assessment during patient visit today. Review of current and previous chest radiographs/CT scans. Discussion of management and test interpretation with health care team and patient/family.   This document was prepared using Dragon voice recognition software and may include unintentional dictation errors.     Vida Rigger, M.D.  Division of Pulmonary & Critical Care Medicine

## 2023-10-14 NOTE — Plan of Care (Signed)
  Problem: Coping: Goal: Ability to adjust to condition or change in health will improve Outcome: Progressing   Problem: Skin Integrity: Goal: Risk for impaired skin integrity will decrease Outcome: Progressing   Problem: Clinical Measurements: Goal: Will remain free from infection Outcome: Progressing   Problem: Coping: Goal: Level of anxiety will decrease Outcome: Progressing   Problem: Education: Goal: Knowledge of disease or condition will improve Outcome: Progressing

## 2023-10-14 NOTE — Assessment & Plan Note (Addendum)
Repeat MRI with bilateral stroke in watershed area. Significant bilateral weakness, right more than left. Neurology is on board. Concern of paraneoplastic versus hypercoagulable state with underlying malignancy ?? Lower extremity venous Doppler was negative for DVT so no need for TEE -Patient was started on Plavix, if she was proven to have underlying malignancy and hypercoagulable state then she will need anticoagulation instead of Plavix per neurology

## 2023-10-14 NOTE — Progress Notes (Signed)
   10/14/23 1300  Spiritual Encounters  Type of Visit Initial  Care provided to: Patient  Referral source Family  Reason for visit Routine spiritual support  OnCall Visit Yes  Spiritual Framework  Patient Stress Factors Exhausted  Interventions  Spiritual Care Interventions Made Compassionate presence;Reflective listening;Prayer   Chaplain received request from family to visit and pray with patient.

## 2023-10-15 ENCOUNTER — Inpatient Hospital Stay: Payer: 59

## 2023-10-15 DIAGNOSIS — G9341 Metabolic encephalopathy: Secondary | ICD-10-CM | POA: Diagnosis not present

## 2023-10-15 LAB — BLOOD GAS, ARTERIAL
Acid-Base Excess: 8 mmol/L — ABNORMAL HIGH (ref 0.0–2.0)
Bicarbonate: 33.9 mmol/L — ABNORMAL HIGH (ref 20.0–28.0)
O2 Content: 1 L/min
O2 Saturation: 98.2 %
Patient temperature: 37
pCO2 arterial: 51 mm[Hg] — ABNORMAL HIGH (ref 32–48)
pH, Arterial: 7.43 (ref 7.35–7.45)
pO2, Arterial: 75 mm[Hg] — ABNORMAL LOW (ref 83–108)

## 2023-10-15 LAB — GLUCOSE, CAPILLARY
Glucose-Capillary: 135 mg/dL — ABNORMAL HIGH (ref 70–99)
Glucose-Capillary: 136 mg/dL — ABNORMAL HIGH (ref 70–99)
Glucose-Capillary: 142 mg/dL — ABNORMAL HIGH (ref 70–99)
Glucose-Capillary: 145 mg/dL — ABNORMAL HIGH (ref 70–99)
Glucose-Capillary: 153 mg/dL — ABNORMAL HIGH (ref 70–99)
Glucose-Capillary: 153 mg/dL — ABNORMAL HIGH (ref 70–99)
Glucose-Capillary: 157 mg/dL — ABNORMAL HIGH (ref 70–99)
Glucose-Capillary: 156 mg/dL — ABNORMAL HIGH (ref 70–99)
Glucose-Capillary: 203 mg/dL — ABNORMAL HIGH (ref 70–99)

## 2023-10-15 LAB — URIC ACID: Uric Acid, Serum: 3.4 mg/dL (ref 2.5–7.1)

## 2023-10-15 LAB — HEMOGLOBIN A1C
Hgb A1c MFr Bld: 8.4 % — ABNORMAL HIGH (ref 4.8–5.6)
Mean Plasma Glucose: 194.38 mg/dL

## 2023-10-15 LAB — AMMONIA: Ammonia: 37 umol/L — ABNORMAL HIGH (ref 9–35)

## 2023-10-15 MED ORDER — LORAZEPAM 2 MG/ML IJ SOLN
1.0000 mg | Freq: Once | INTRAMUSCULAR | Status: AC
  Administered 2023-10-15: 1 mg via INTRAVENOUS
  Filled 2023-10-15: qty 1

## 2023-10-15 NOTE — Progress Notes (Signed)
PULMONOLOGY         Date: 10/15/2023,   MRN# [id number] Michele House Oct 29, 1956     AdmissionWeight: 74.1 kg                 CurrentWeight: 74.1 kg  Referring provider: Dr Nelson Chimes   CHIEF COMPLAINT:   Lung nodule suspicious for carcinoma   HISTORY OF PRESENT ILLNESS   Patient with COPD, dyslipidemia, HTN, thyroid dysfunction, GERD, She has 1.5cm RUL nodule suspicious for primary bronchogenic carcinoma. She needs to have tissue diagnosis. PCCM consultation for biopsy.   10/15/23- patient encepalopathic on examination.  Have ordered ABG, Uric acid, ammonia levels, renal function is normal, electrolytes normal , sugars improved <200.  Neuro and oncology following, s/p LP, s/p paraneoplastic panel, s/p autoimmune panel.  Planning for lung biopsy on 10/29/23 if patient is more stable and safe candidate for general anesthesia  PAST MEDICAL HISTORY   Past Medical History:  Diagnosis Date   Acid reflux    COPD (chronic obstructive pulmonary disease) (HCC)    Diabetes mellitus without complication (HCC)    Hyperlipidemia    Hypertension    Thyroid disease      SURGICAL HISTORY   Past Surgical History:  Procedure Laterality Date   appendectomy     APPENDECTOMY     BREAST BIOPSY Right    CORE W/CLIP - NEG   TOTAL VAGINAL HYSTERECTOMY     TUMOR REMOVAL     benign;stomach     FAMILY HISTORY   Family History  Problem Relation Age of Onset   Heart attack Mother    Hypertension Mother    Breast cancer Sister 57   Breast cancer Maternal Aunt        40'S   Breast cancer Maternal Grandmother      SOCIAL HISTORY   Social History   Tobacco Use   Smoking status: Former    Current packs/day: 0.00    Types: Cigarettes    Quit date: 12/15/1985    Years since quitting: 37.8   Smokeless tobacco: Never  Substance Use Topics   Alcohol use: No   Drug use: No     MEDICATIONS    Home Medication:    Current Medication:  Current  Facility-Administered Medications:    acetaminophen (TYLENOL) tablet 650 mg, 650 mg, Oral, Q6H PRN, Floydene Flock, MD, 650 mg at 10/14/23 1942   clopidogrel (PLAVIX) tablet 75 mg, 75 mg, Oral, Daily, Milon Dikes, MD, 75 mg at 10/15/23 8295   dextromethorphan-guaiFENesin (MUCINEX DM) 30-600 MG per 12 hr tablet 1 tablet, 1 tablet, Oral, BID PRN, Floydene Flock, MD   enoxaparin (LOVENOX) injection 40 mg, 40 mg, Subcutaneous, Daily, Wieting, Richard, MD, 40 mg at 10/15/23 0917   insulin aspart (novoLOG) injection 0-15 Units, 0-15 Units, Subcutaneous, TID WC, Wieting, Richard, MD, 3 Units at 10/15/23 1228   insulin aspart (novoLOG) injection 0-5 Units, 0-5 Units, Subcutaneous, QHS, Wieting, Richard, MD, 2 Units at 10/11/23 2115   insulin glargine-yfgn (SEMGLEE) injection 15 Units, 15 Units, Subcutaneous, QHS, Amin, Tilman Neat, MD, 15 Units at 10/14/23 2235   ipratropium-albuterol (DUONEB) 0.5-2.5 (3) MG/3ML nebulizer solution 3 mL, 3 mL, Nebulization, Q4H PRN, Floydene Flock, MD   levothyroxine (SYNTHROID) tablet 100 mcg, 100 mcg, Oral, Q0600, Floydene Flock, MD, 100 mcg at 10/15/23 0624   loratadine (CLARITIN) tablet 10 mg, 10 mg, Oral, Daily, Floydene Flock, MD, 10 mg at 10/15/23 0918   ondansetron (ZOFRAN) tablet  4 mg, 4 mg, Oral, Q6H PRN **OR** ondansetron (ZOFRAN) injection 4 mg, 4 mg, Intravenous, Q6H PRN, Floydene Flock, MD   Oral care mouth rinse, 15 mL, Mouth Rinse, PRN, Renae Gloss, Richard, MD   QUEtiapine (SEROQUEL) tablet 12.5 mg, 12.5 mg, Oral, QHS PRN, Alford Highland, MD, 12.5 mg at 10/13/23 0154   sertraline (ZOLOFT) tablet 25 mg, 25 mg, Oral, Daily, Renae Gloss, Richard, MD, 25 mg at 10/15/23 2025    ALLERGIES   Penicillins, Aspirin, and Sulfa antibiotics     REVIEW OF SYSTEMS    Review of Systems:  Gen:  Denies  fever, sweats, chills weigh loss  HEENT: Denies blurred vision, double vision, ear pain, eye pain, hearing loss, nose bleeds, sore throat Cardiac:  No  dizziness, chest pain or heaviness, chest tightness,edema Resp:   reports dyspnea chronically  Gi: Denies swallowing difficulty, stomach pain, nausea or vomiting, diarrhea, constipation, bowel incontinence Gu:  Denies bladder incontinence, burning urine Ext:   Denies Joint pain, stiffness or swelling Skin: Denies  skin rash, easy bruising or bleeding or hives Endoc:  Denies polyuria, polydipsia , polyphagia or weight change Psych:   Denies depression, insomnia or hallucinations   Other:  All other systems negative   VS: BP 124/60 (BP Location: Left Arm)   Pulse 80   Temp 97.8 F (36.6 C)   Resp 16   Ht 5' 0.98" (1.549 m)   Wt 74.1 kg   LMP 12/24/1998 Comment: prior to her hysterectomy  SpO2 95%   BMI 30.88 kg/m      PHYSICAL EXAM    GENERAL:NAD, no fevers, chills, no weakness no fatigue HEAD: Normocephalic, atraumatic.  EYES: Pupils equal, round, reactive to light. Extraocular muscles intact. No scleral icterus.  MOUTH: Moist mucosal membrane. Dentition intact. No abscess noted.  EAR, NOSE, THROAT: Clear without exudates. No external lesions.  NECK: Supple. No thyromegaly. No nodules. No JVD.  PULMONARY: decreased breath sounds with mild rhonchi worse at bases bilaterally.  CARDIOVASCULAR: S1 and S2. Regular rate and rhythm. No murmurs, rubs, or gallops. No edema. Pedal pulses 2+ bilaterally.  GASTROINTESTINAL: Soft, nontender, nondistended. No masses. Positive bowel sounds. No hepatosplenomegaly.  MUSCULOSKELETAL: No swelling, clubbing, or edema. Range of motion full in all extremities.  NEUROLOGIC: Cranial nerves II through XII are intact. No gross focal neurological deficits. Sensation intact. Reflexes intact.  SKIN: No ulceration, lesions, rashes, or cyanosis. Skin warm and dry. Turgor intact.  PSYCHIATRIC: Mood, affect within normal limits. The patient is awake, alert and oriented x 3. Insight, judgment intact.       IMAGING   CT chest -  Enlarging spiculated  nodule in the inferior right upper lobe now 1.6 cm compared to 1.3 cm previously.   ASSESSMENT/PLAN   Lung nodule suspicious for primary bronchogenic carcinoma   1.5cm - RUL   -patient would require biopsy - will order bronchoscopy     -Reviewed risks/complications and benefits with patient, risks include infection, pneumothorax/pneumomediastinum which may require chest tube placement as well as overnight/prolonged hospitalization and possible mechanical ventilation. Other risks include bleeding and very rarely death.  Patient understands risks and wishes to proceed.  Additional questions were answered, and patient is aware that post procedure patient will be going home with family and may experience cough with possible clots on expectoration as well as phlegm which may last few days as well as hoarseness of voice post intubation and mechanical ventilation.      Encephalopathy  S/p neuro and med/onc eval  -  ordered ammonia, uric acid, ABG today       Thank you for allowing me to participate in the care of this patient.   Patient/Family are satisfied with care plan and all questions have been answered.    Provider disclosure: Patient with at least one acute or chronic illness or injury that poses a threat to life or bodily function and is being managed actively during this encounter.  All of the below services have been performed independently by signing provider:  review of prior documentation from internal and or external health records.  Review of previous and current lab results.  Interview and comprehensive assessment during patient visit today. Review of current and previous chest radiographs/CT scans. Discussion of management and test interpretation with health care team and patient/family.   This document was prepared using Dragon voice recognition software and may include unintentional dictation errors.     Vida Rigger, M.D.  Division of Pulmonary & Critical Care Medicine

## 2023-10-15 NOTE — Progress Notes (Signed)
Physical Therapy Treatment Patient Details Name: Michele House MRN: [id number] DOB: 10/25/1956 Today's Date: 10/15/2023   History of Present Illness Pt is a 67 y/o F admitted on 10/02/23 after presenting with acute on chronic respiratory failure with hypoxia, COPD exacerbation, PNA, UTI, & encephalopathy.  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 Mild associated  petechial hemorrhage".  PMH: COPD, chronic respiratory failure on 2L,HTN, DM2, HFpEF.    PT Comments  Pt resting in bed upon PT arrival; pt agreeable to therapy session; pt's daughter present for part of session.  Performed L LE ex's in bed with assist as needed (no active R UE/LE movement noted during session).  Pt then reporting needing to toilet (use bed pan) for BM.  OT arrived for partial PT/OT co-session.  Pt max assist with logrolling to L side (pt noted to be incontinent of BM) and required total assist for peri-care.  Pt noted with increased accessory muscle use for breathing during session and then pt reporting having a panic attack (active listening and emotional support provided; pt repositioned to attempt to improve comfort but even with repositioning pt reporting feeling like she was having difficulty breathing (SpO2 sats 96% or greater on 2 L O2 via nasal cannula); nurse notified and came to assess/address pt's needs; MD arrived during session and updated on concerns).  Limited activities d/t above concerns.  Will continue to progress pt with therapy per pt tolerance.   If plan is discharge home, recommend the following: Assistance with cooking/housework;Supervision due to cognitive status;Direct supervision/assist for financial management;Help with stairs or ramp for entrance;Assist for transportation;Direct supervision/assist for medications management;Assistance with feeding;Two people to help with walking and/or  transfers;A lot of help with bathing/dressing/bathroom   Can travel by private vehicle     No  Equipment Recommendations  BSC/3in1;Wheelchair (measurements PT);Wheelchair cushion (measurements PT);Hoyer lift;Hospital bed    Recommendations for Other Services       Precautions / Restrictions Precautions Precautions: Fall Restrictions Weight Bearing Restrictions Per Provider Order: No     Mobility  Bed Mobility Overal bed mobility: Needs Assistance Bed Mobility: Rolling Rolling: Max assist, +2 for safety/equipment, Used rails (to L side)         General bed mobility comments: assist for trunk and B LE's; vc's for technique    Transfers                        Ambulation/Gait                   Stairs             Wheelchair Mobility     Tilt Bed    Modified Rankin (Stroke Patients Only)       Balance                                            Cognition Arousal: Alert Behavior During Therapy: Flat affect                                   General Comments: Pt oriented to self, place, grossly to CVA  (but not timeframe), and date (December 2024); increased processing time for 1 step commands; endorses seeing a  man in the room (hallucination); intermittently labile noting she's having a panic attack and later upset and reports feeling like "I won't be around much longer." (Pt's nurse and MD Nelson Chimes updated on these concerns and pt's statements).        Exercises General Exercises - Lower Extremity Ankle Circles/Pumps: AROM, Strengthening, Left, 10 reps, Supine Quad Sets: AROM, Strengthening, Left, 10 reps, Supine Short Arc Quad: AROM, Strengthening, Left, 10 reps, Supine Heel Slides: AAROM, Strengthening, Left, 10 reps, Supine Hip ABduction/ADduction: AAROM, Strengthening, Left, 10 reps, Supine    General Comments  Nursing cleared pt for participation in physical therapy.  Pt agreeable to PT session.       Pertinent Vitals/Pain Pain Assessment Pain Assessment: No/denies pain Pain Intervention(s): Limited activity within patient's tolerance, Monitored during session, Repositioned HR WFL during sessions activities.    Home Living                          Prior Function            PT Goals (current goals can now be found in the care plan section) Acute Rehab PT Goals Patient Stated Goal: none stated PT Goal Formulation: With patient Time For Goal Achievement: 10/17/23 Potential to Achieve Goals: Poor Progress towards PT goals: Not progressing toward goals - comment (limited d/t SOB)    Frequency    Min 1X/week      PT Plan      Co-evaluation PT/OT/SLP Co-Evaluation/Treatment: Yes Reason for Co-Treatment: Complexity of the patient's impairments (multi-system involvement);Necessary to address cognition/behavior during functional activity;For patient/therapist safety;To address functional/ADL transfers PT goals addressed during session: Mobility/safety with mobility OT goals addressed during session: ADL's and self-care      AM-PAC PT "6 Clicks" Mobility   Outcome Measure  Help needed turning from your back to your side while in a flat bed without using bedrails?: A Lot Help needed moving from lying on your back to sitting on the side of a flat bed without using bedrails?: Total Help needed moving to and from a bed to a chair (including a wheelchair)?: Total Help needed standing up from a chair using your arms (e.g., wheelchair or bedside chair)?: Total Help needed to walk in hospital room?: Total Help needed climbing 3-5 steps with a railing? : Total 6 Click Score: 7    End of Session Equipment Utilized During Treatment: Oxygen (2 L via nasal cannula) Activity Tolerance: Patient limited by fatigue;Other (comment) (Limited d/t reports of difficulty breathing) Patient left: in bed;with call bell/phone within reach;with bed alarm set Nurse Communication:  Mobility status;Precautions;Other (comment) (Pt on bed pan (pt reporting still needing to use)) PT Visit Diagnosis: Unsteadiness on feet (R26.81);Other abnormalities of gait and mobility (R26.89);Muscle weakness (generalized) (M62.81);Difficulty in walking, not elsewhere classified (R26.2)     Time: 1100-1150 PT Time Calculation (min) (ACUTE ONLY): 50 min  Charges:    $Therapeutic Exercise: 8-22 mins $Therapeutic Activity: 8-22 mins PT General Charges $$ ACUTE PT VISIT: 1 Visit                     Hendricks Limes, PT 10/15/23, 6:05 PM

## 2023-10-15 NOTE — Progress Notes (Signed)
NEUROLOGY CONSULT FOLLOW UP NOTE   Date of service: October 15, 2023 Patient Name: Michele House MRN:  [id number] DOB:  08-Sep-1956  Interval Hx/subjective  Patient seen and examined. No new change reported overnight   Vitals   Vitals:   10/14/23 2101 10/15/23 0028 10/15/23 0404 10/15/23 0742  BP: (!) 119/55 97/72 102/65 124/60  Pulse: 83 89 86 80  Resp: 20 20 18 16   Temp: 97.6 F (36.4 C) 98.3 F (36.8 C)  97.8 F (36.6 C)  TempSrc: Oral Oral    SpO2: 97% 94% 95% 95%  Weight:      Height:         Body mass index is 30.88 kg/m.  Physical Exam   General: Well-developed well-nourished: Slightly unkempt comfortably laying in bed HEENT: Normocephalic atraumatic Lungs: Scattered rales Cardiovascular: Regular rate rhythm Abdomen nondistended nontender Neurological exam She is awake, laying comfortably in bed. Able to tell me she is in the hospital Could not tell me the age or month She told me today upon asking her how she is doing, that that she is feeling better although she was still having some increased work of breathing Denied chest pain or discomfort Was able to name simple objects Was able to follow simple commands Cranial nerves: Pupils are equal and reactive to light, gaze was midline, she is able to look to the right and also to the left.  She blinks to threat from both sides.  Her face appears symmetric.  Tongue and palate midline Motor examination with flaccid right upper extremity with not mild withdrawal to noxious stimulation.  Right lower extremity also is nearly flaccid but did not have stimulation she has mild withdrawal.  Left upper extremity is 4/5 strength.  Left lower extremity is barely 3/5 strength with a lot of coaching. Sensation: Somewhat diminished on the right in comparison to the left Coordination difficult to assess    Medications  Current Facility-Administered Medications:    acetaminophen (TYLENOL) tablet 650 mg, 650 mg, Oral, Q6H  PRN, Floydene Flock, MD, 650 mg at 10/14/23 1942   clopidogrel (PLAVIX) tablet 75 mg, 75 mg, Oral, Daily, Milon Dikes, MD, 75 mg at 10/15/23 4098   dextromethorphan-guaiFENesin (MUCINEX DM) 30-600 MG per 12 hr tablet 1 tablet, 1 tablet, Oral, BID PRN, Floydene Flock, MD   enoxaparin (LOVENOX) injection 40 mg, 40 mg, Subcutaneous, Daily, Wieting, Richard, MD, 40 mg at 10/15/23 0917   insulin aspart (novoLOG) injection 0-15 Units, 0-15 Units, Subcutaneous, TID WC, Alford Highland, MD, 3 Units at 10/15/23 1228   insulin aspart (novoLOG) injection 0-5 Units, 0-5 Units, Subcutaneous, QHS, Alford Highland, MD, 2 Units at 10/11/23 2115   insulin glargine-yfgn (SEMGLEE) injection 15 Units, 15 Units, Subcutaneous, QHS, Amin, Tilman Neat, MD, 15 Units at 10/14/23 2235   ipratropium-albuterol (DUONEB) 0.5-2.5 (3) MG/3ML nebulizer solution 3 mL, 3 mL, Nebulization, Q4H PRN, Floydene Flock, MD   levothyroxine (SYNTHROID) tablet 100 mcg, 100 mcg, Oral, Q0600, Floydene Flock, MD, 100 mcg at 10/15/23 0624   loratadine (CLARITIN) tablet 10 mg, 10 mg, Oral, Daily, Floydene Flock, MD, 10 mg at 10/15/23 0918   ondansetron (ZOFRAN) tablet 4 mg, 4 mg, Oral, Q6H PRN **OR** ondansetron (ZOFRAN) injection 4 mg, 4 mg, Intravenous, Q6H PRN, Floydene Flock, MD   Oral care mouth rinse, 15 mL, Mouth Rinse, PRN, Renae Gloss, Richard, MD   QUEtiapine (SEROQUEL) tablet 12.5 mg, 12.5 mg, Oral, QHS PRN, Alford Highland, MD, 12.5 mg at 10/13/23 0154  sertraline (ZOLOFT) tablet 25 mg, 25 mg, Oral, Daily, Renae Gloss, Richard, MD, 25 mg at 10/15/23 0918  Labs and Diagnostic Imaging   CBC:  Recent Labs  Lab 10/11/23 0432 10/14/23 0513  WBC 9.2 10.2  HGB 13.1 12.1  HCT 41.2 37.4  MCV 90.4 90.3  PLT 223 130*   LDL 38 A1c was 7.14 months ago.  Basic Metabolic Panel:  Lab Results  Component Value Date   NA 142 10/14/2023   K 3.9 10/14/2023   CO2 29 10/14/2023   GLUCOSE 172 (H) 10/14/2023   BUN 44 (H) 10/14/2023    CREATININE 0.91 10/14/2023   CALCIUM 8.5 (L) 10/14/2023   GFRNONAA >60 10/14/2023   GFRAA >60 08/19/2017   MRI Brain12/4/24 1. No acute intracranial abnormality. 2. Confluent hyperintense T2-weighted signal within the superior bihemispheric white matter, most commonly indicating chronic microvascular ischemia.   MR brain repeated 10/13/23 IMPRESSION: 1. Multiple acute infarcts in the high bilateral posterior frontal and parietal lobes, potentially watershed territory. Mild associated petechial hemorrhage. 2. 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  CT angio head and neck  10/13/23  with no emergent LVO.  Atheromatous changes about the carotid bifurcations bilaterally up to 50% stenosis.  rEEG 12/6 showed mild diffuse slowing without epileptiform abnormalities. Repeat EEG on 10/11/23 also suggestive of diffuse slowing without seizures or epileptiform discharges.  No evidence of DVT on lower extremity Dopplers  Assessment  67 year old woman with past medical history significant for COPD, chronic respiratory failure on 2 L of home O2, hypertension, type 2 diabetes, heart failure with preserved ejection fraction who presented with acute on chronic respiratory failure with hypoxia, COPD exacerbation, pneumonia, and UTI. Neurology was consulted for altered mental status.   Due to her acute delirium and altered mental status and the fact that she had pretty rapid decline in her cognition, LP was done which was essentially unremarkable but due to high suspicion of this being some sort of an autoimmune/paraneoplastic process because of CTA of the chest with right upper lobe spiculated nodule, she was given 5 days of steroids which has really not made much of a change.  Repeat imaging with MRI because of more pronounced right-sided weakness was performed-the MRI shows bilateral frontoparietal infarcts almost in a watershed pattern without any significant stenosis  in the head and neck vessels  Workup for reversible causes of dementia has been unremarkable  Her pulmonary nodule has increased in size from 3 months ago  Now she has strokes with unclear etiology-no evidence of A-fib, no significant narrowing in the blood vessels of head and neck which makes you wonder if she has hypercoagulability from an underlying malignant process and mentation is also related to a paraneoplastic type of encephalitis  She has had a spinal tap done which has been bland.  Send out labs for autoimmune encephalopathy in the CSF and serum are pending. 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 New acute ischemic strokes raising suspicion for hypercoagulability-no clear etiology of the stroke identifiably   Recommendations  Follow-up the send out autoimmune encephalopathy labs No need to repeat EEG or 2D echocardiogram at this time No evidence of DVT.  No TEE required. For stroke prevention-started her on Plavix.  She is allergic to  aspirin.  Appreciate pulmonology and oncology  If it is deemed that the etiology truly is hypercoagulability, then she will need anticoagulation and not antiplatelets for stroke prevention.  Plan discussed with Dr. Nelson Chimes   -- Milon Dikes, MD Neurologist [facility]

## 2023-10-15 NOTE — Progress Notes (Signed)
Occupational Therapy Treatment Patient Details Name: Michele House MRN: [id number] DOB: 09-10-1956 Today's Date: 10/15/2023   History of present illness Pt is a 67 y/o F admitted on 10/02/23 after presenting with acute on chronic respiratory failure with hypoxia, COPD exacerbation, PNA, UTI, & encephalopathy. PMH: COPD, chronic respiratory failure on 2L,HTN, DM2, HFpEF   OT comments  Pt seen for OT tx overlapping with PT tx for optimizing bed mobility efforts. Pt noted to have had BM in bed and requesting additional assist for continued urge to go. Pt required MAX A for rolling R/L and maintaining sidelying while requiring MAX A for sidelying pericare. Pt unable to protect RUE in safe position, requiring assist. Pt intermittently tearful/upset noting that she doesn't think she will be "here much longer" and then also reporting seeing a man in the room (no one there). Pt demo'd difficulty following simple commands, often looking away from therapists when asked questions. Pt continued to express urge for BM so once cleaned up from incontinent BM, pt placed on bed pan. Pt reported feeling SOB and "having a panic attack." RN and MD promptly notified. VSS. Active listening and emotional support provided. Pt continues to benefit from skilled OT services to maximize her return to PLOF.       If plan is discharge home, recommend the following:  Assistance with cooking/housework;Assist for transportation;Help with stairs or ramp for entrance;Direct supervision/assist for medications management;Supervision due to cognitive status;Direct supervision/assist for financial management;Two people to help with walking and/or transfers;Two people to help with bathing/dressing/bathroom   Equipment Recommendations  Other (comment) (defer to next venue)    Recommendations for Other Services      Precautions / Restrictions Precautions Precautions: Fall Restrictions Weight Bearing Restrictions Per Provider  Order: No       Mobility Bed Mobility Overal bed mobility: Needs Assistance Bed Mobility: Rolling Rolling: Max assist, +2 for safety/equipment, Used rails              Transfers                         Balance                                           ADL either performed or assessed with clinical judgement   ADL Overall ADL's : Needs assistance/impaired                           Toilet Transfer Details (indicate cue type and reason): MAX A for rolling for bed level toileting Toileting- Clothing Manipulation and Hygiene: +2 for physical assistance;Maximal assistance;Bed level Toileting - Clothing Manipulation Details (indicate cue type and reason): MAX A for the rolling and maintaining sidelying and +2 for MAX A pericare            Extremity/Trunk Assessment              Vision       Perception     Praxis      Cognition Arousal: Alert Behavior During Therapy: Flat affect                                   General Comments: Pt oriented to self, place, grossly to CVA  (but not  timeframe), or date; increased processing time for 1 step commands; endorses seeing a man in the room (hallucination); intermittently labile noting she's having a panic attack and later upset and reports feeling like "I won't be around much longer."        Exercises Other Exercises Other Exercises: Attempted instruction in PLB to support breath recovery but pt unable to demo on command, using accessory muscles to breath, VSS despite pt noting she feels like she can't breathe well. RN and MD notified.    Shoulder Instructions       General Comments      Pertinent Vitals/ Pain       Pain Assessment Pain Assessment: No/denies pain  Home Living                                          Prior Functioning/Environment              Frequency  Min 1X/week        Progress Toward Goals  OT  Goals(current goals can now be found in the care plan section)  Progress towards OT goals: OT to reassess next treatment  Acute Rehab OT Goals Patient Stated Goal: feel better OT Goal Formulation: With patient Time For Goal Achievement: 10/26/23 Potential to Achieve Goals: Good  Plan      Co-evaluation    PT/OT/SLP Co-Evaluation/Treatment: Yes Reason for Co-Treatment: Complexity of the patient's impairments (multi-system involvement);Necessary to address cognition/behavior during functional activity;For patient/therapist safety;To address functional/ADL transfers PT goals addressed during session: Mobility/safety with mobility;Balance OT goals addressed during session: ADL's and self-care      AM-PAC OT "6 Clicks" Daily Activity     Outcome Measure   Help from another person eating meals?: A Little Help from another person taking care of personal grooming?: A Lot Help from another person toileting, which includes using toliet, bedpan, or urinal?: Total Help from another person bathing (including washing, rinsing, drying)?: Total Help from another person to put on and taking off regular upper body clothing?: A Lot Help from another person to put on and taking off regular lower body clothing?: Total 6 Click Score: 10    End of Session Equipment Utilized During Treatment: Oxygen  OT Visit Diagnosis: Other abnormalities of gait and mobility (R26.89);Muscle weakness (generalized) (M62.81)   Activity Tolerance Other (comment) (SOB, tearful)   Patient Left in bed;with call bell/phone within reach;with bed alarm set;Other (comment) (on bed pan, NT and RN aware)   Nurse Communication Mobility status;Other (comment) (breathing, pt endorsing panic attack)        Time: 7628-3151 OT Time Calculation (min): 33 min  Charges: OT General Charges $OT Visit: 1 Visit OT Treatments $Self Care/Home Management : 8-22 mins  Arman Filter., MPH, MS, OTR/L ascom [phone] 10/15/23, 1:47  PM

## 2023-10-15 NOTE — Plan of Care (Signed)
Problem: Education: Goal: Ability to describe self-care measures that may prevent or decrease complications (Diabetes Survival Skills Education) will improve Outcome: Progressing Goal: Individualized Educational Video(s) Outcome: Progressing   Problem: Coping: Goal: Ability to adjust to condition or change in health will improve Outcome: Progressing   Problem: Fluid Volume: Goal: Ability to maintain a balanced intake and output will improve Outcome: Progressing   Problem: Health Behavior/Discharge Planning: Goal: Ability to identify and utilize available resources and services will improve Outcome: Progressing Goal: Ability to manage health-related needs will improve Outcome: Progressing   Problem: Metabolic: Goal: Ability to maintain appropriate glucose levels will improve Outcome: Progressing   Problem: Nutritional: Goal: Maintenance of adequate nutrition will improve Outcome: Progressing Goal: Progress toward achieving an optimal weight will improve Outcome: Progressing   Problem: Skin Integrity: Goal: Risk for impaired skin integrity will decrease Outcome: Progressing   Problem: Tissue Perfusion: Goal: Adequacy of tissue perfusion will improve Outcome: Progressing   Problem: Education: Goal: Knowledge of General Education information will improve Description: Including pain rating scale, medication(s)/side effects and non-pharmacologic comfort measures Outcome: Progressing   Problem: Health Behavior/Discharge Planning: Goal: Ability to manage health-related needs will improve Outcome: Progressing   Problem: Clinical Measurements: Goal: Ability to maintain clinical measurements within normal limits will improve Outcome: Progressing Goal: Will remain free from infection Outcome: Progressing Goal: Diagnostic test results will improve Outcome: Progressing Goal: Respiratory complications will improve Outcome: Progressing Goal: Cardiovascular complication will  be avoided Outcome: Progressing   Problem: Activity: Goal: Risk for activity intolerance will decrease Outcome: Progressing   Problem: Nutrition: Goal: Adequate nutrition will be maintained Outcome: Progressing   Problem: Coping: Goal: Level of anxiety will decrease Outcome: Progressing   Problem: Elimination: Goal: Will not experience complications related to bowel motility Outcome: Progressing Goal: Will not experience complications related to urinary retention Outcome: Progressing   Problem: Pain Management: Goal: General experience of comfort will improve Outcome: Progressing   Problem: Safety: Goal: Ability to remain free from injury will improve Outcome: Progressing   Problem: Skin Integrity: Goal: Risk for impaired skin integrity will decrease Outcome: Progressing   Problem: Education: Goal: Knowledge of disease or condition will improve Outcome: Progressing Goal: Knowledge of the prescribed therapeutic regimen will improve Outcome: Progressing Goal: Individualized Educational Video(s) Outcome: Progressing   Problem: Activity: Goal: Ability to tolerate increased activity will improve Outcome: Progressing Goal: Will verbalize the importance of balancing activity with adequate rest periods Outcome: Progressing   Problem: Respiratory: Goal: Ability to maintain a clear airway will improve Outcome: Progressing Goal: Levels of oxygenation will improve Outcome: Progressing Goal: Ability to maintain adequate ventilation will improve Outcome: Progressing   Problem: Activity: Goal: Ability to tolerate increased activity will improve Outcome: Progressing   Problem: Clinical Measurements: Goal: Ability to maintain a body temperature in the normal range will improve Outcome: Progressing   Problem: Respiratory: Goal: Ability to maintain adequate ventilation will improve Outcome: Progressing Goal: Ability to maintain a clear airway will improve Outcome:  Progressing   Problem: Education: Goal: Knowledge of disease or condition will improve Outcome: Progressing Goal: Knowledge of secondary prevention will improve (MUST DOCUMENT ALL) Outcome: Progressing Goal: Knowledge of patient specific risk factors will improve Loraine Leriche N/A or DELETE if not current risk factor) Outcome: Progressing   Problem: Ischemic Stroke/TIA Tissue Perfusion: Goal: Complications of ischemic stroke/TIA will be minimized Outcome: Progressing   Problem: Coping: Goal: Will verbalize positive feelings about self Outcome: Progressing Goal: Will identify appropriate support needs Outcome: Progressing   Problem: Health  Behavior/Discharge Planning: Goal: Ability to manage health-related needs will improve Outcome: Progressing Goal: Goals will be collaboratively established with patient/family Outcome: Progressing   Problem: Self-Care: Goal: Ability to participate in self-care as condition permits will improve Outcome: Progressing Goal: Verbalization of feelings and concerns over difficulty with self-care will improve Outcome: Progressing Goal: Ability to communicate needs accurately will improve Outcome: Progressing

## 2023-10-15 NOTE — Progress Notes (Signed)
Pt has orders for q4 blood sugar checks. Staff has performed these checks during the shift, however results are not populating in the chart. Her blood sugar checks are 2000-135. 0000-145, and 0400-153. No coverage needed.

## 2023-10-15 NOTE — Progress Notes (Signed)
Checked BS on this patient and it was 84. Results did not transfer.

## 2023-10-15 NOTE — Progress Notes (Signed)
Progress Note   Patient: Michele House UXL:[id number] DOB: April 22, 1956 DOA: 10/02/2023     13 DOS: the patient was seen and examined on 10/15/2023   Brief hospital course: Taken from H&P.  Michele House is a 67 y.o. female with medical history significant of COPD, chronic respiratory failure on 2 L, hypertension, type 2 diabetes, HFpEF presenting with acute on chronic respiratory failure with hypoxia, COPD exacerbation, pneumonia, UTI, encephalopathy.  Patient reports generalized malaise over the past 3 to 4 days.  Patient noted to have been recently admitted in the hospital November 21 through November 24 for similar issues including COPD exacerbation.  Patient reports not taking her medication since discharge.  Presented to the ER afebrile, heart rate 100s, BP stable.  Satting 96% on 3 L nasal cannula.  White count 23.4, hemoglobin 15, platelets 401, lactate 1.6, procalcitonin less than 0.1, troponin 18, urinalysis indicative of infection.  Urine drug screen negative.  Chest x-ray with a right sided pneumonia.  BNP within normal limits.  Creatinine 1.03.  Glucose 368.EKG sinus tachycardia.  Admitted for concern of pneumonia/COPD exacerbation and UTI. Started on Rocephin and azithromycin.  12/1: Vital stable, respiratory viral panel negative, procalcitonin negative, preliminary blood cultures negative.  Urine cultures are ordered as add-on, ammonia levels mildly elevated at 39, strep pneumo negative, CBG elevated at 244, history of enlarging pulmonary nodule with recommendations for PET/CT during most recent admission which has not been done yet, if video bronchoscopy was scheduled for 10/13/2023 as outpatient.  Unable to take care of himself and son cannot provide the required care as she required 24-hour supervision due to worsening dementia.  PT is recommending SNF.  12/2: Hemodynamically stable, now on baseline oxygen of 2 L.  Needs SNF placement. Urine cultures pending.  12/3:  Blood pressure started trending up, restarting home Zestoretic.  Urine cultures with strep agalactiae, penicillin allergy noted, will continue with ceftriaxone for now.  Also started on Remeron for concern of worsening depression and unable to sleep at night  12/4.  Some very concerned about patient's mental status not being seen.  Patient receiving antibiotics already and should have improved by now.  Will hold Lipitor.  Restart low-dose Zoloft.  Discontinue Remeron and do Seroquel at night. 12/5.  Patient has some blurry vision and weakness and pain.  MRI of the brain negative for acute intracranial abnormality. 12/6.  Patient stated she slept well.  Patient able to lift up her arms up off the bed today.  Patient thought she heard her son's voice outside the room. 12/7.  Patient sitting up in chair when I saw her.  Was able to feed herself breakfast.  Felt okay.  Did not offer any complaints. 12/8.  Patient more confused today than yesterday.  Able to follow some simple commands but not as good of a conversation today as yesterday. 12/9.  Patient still confused today.  Does not follow simple commands today. 12/10.  Patient more talkative today but was looking up at the ceiling when she was talking with me.  She does not move her extremities to command but does move them on her own.  Physical therapy and Occupational Therapy did not see her move her right arm. 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  Mild associated petechial hemorrhage.  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 12/12: Patient with new stroke, CTA head and neck with no large vessel occlusion.  Patient had a recent echocardiogram which was normal. CSF cultures remain negative.  Lipid panel with increased triglyceride at 347, HDL 29 and LDL of  38.  CBC with mild thrombocytopenia at 130, BMP mostly unremarkable.  Patient with significant right-sided weakness. Concern of paraneoplastic versus hypercoagulability secondary to malignancy.  Patient missed her appointment for bronchoscopy and outpatient appointment for PET scan due to recurrent hospitalizations. Involving pulmonary and oncology for their input. Send out encephalitis panel still pending.  12/13: Patient continued to have waxing and waning mental status, unable to move right upper and lower extremities.  Having some hallucination.  Pulmonary is planning lung biopsy on 12/27 if she remains stable.  Rapidly progressive dementia and now with underlying stroke, question of paraneoplastic encephalitis and hypercoagulability secondary to underlying undiagnosed malignancy. Patient will also get benefit from neuropsych evaluation as outpatient.   Assessment and Plan: * Acute metabolic encephalopathy Waxing and waning mental status, having some hallucination.   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   Discontinued Lipitor.  Patient has elevated vitamin B12, nonreactive RPR, TSH in the normal range.  Potentially has a progressive dementia versus limbic encephalitis versus autoimmune encephalitis versus leptomeningeal spread from spiculated nodule.  Lumbar puncture done 12/6 with testing sent to the [facility].  Patient completed 5 days of high-dose Solu-Medrol (today will be day 5).  Repeat ABG does not show CO2 retention.  Repeat EEG does not show any seizure activity.    No definitive diagnosis yet.  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  CVA (cerebral vascular accident) (HCC) Repeat MRI with bilateral stroke in watershed area. Significant bilateral weakness, right more than  left. Neurology is on board. Concern of paraneoplastic versus hypercoagulable state with underlying malignancy ?? Lower extremity venous Doppler was negative for DVT so no need for TEE -Patient was started on Plavix  Uncontrolled type 2 diabetes mellitus with hypoglycemia, without long-term current use of insulin (HCC) CBG elevated.  Patient completed 5-day course of high-dose steroid. -Increasing Semglee to 15 unit daily -Continue with SSI  Hypotension Blood pressure within goal.  Holding antihypertensive medications  Hypernatremia Resolved. -Continue to monitor  Acute on chronic respiratory failure with hypoxia (HCC) Patient initially required 3 L now on 1 L.  Lung nodule 1.3 cm right upper lobe spiculated in nature.  Could be a primary lung cancerous process.  Will need outpatient PET scan and likely bronchoscopy for diagnosis.  Spoke with Dr. Karna Christmas today and she has missed a few outpatient appointments.  COPD exacerbation (HCC) Resolved  Pneumonia Completed 5 days of Rocephin and Zithromax  UTI (urinary tract infection) Streptococcus agalactiae growing out of urine culture.  Completed Rocephin  (HFpEF) heart failure with preserved ejection fraction (HCC) Chronic in nature.  Not in acute exacerbation.  No signs of heart failure currently.  2D echo 09/2023 showed LVEF 55 to 60%, no regional wall motion abnormalities    Essential hypertension Holding antihypertensive medications with hypotension.  Type 2 diabetes mellitus with obesity (HCC) Sugars high now that she is on high-dose Solu-Medrol.  BMI 30.88   Subjective: Patient was feeling very anxious and having hallucinations, she was seeing a person in his room and keeps saying that she will not live longer and is about to die soon. Still  not moving right upper and lower extremity.  Physical Exam: Vitals:   10/14/23 2101 10/15/23 0028 10/15/23 0404 10/15/23 0742  BP: (!) 119/55 97/72 102/65 124/60  Pulse: 83  89 86 80  Resp: 20 20 18 16   Temp: 97.6 F (36.4 C) 98.3 F (36.8 C)  97.8 F (36.6 C)  TempSrc: Oral Oral    SpO2: 97% 94% 95% 95%  Weight:      Height:       General.  Frail elderly lady, in no acute distress. Pulmonary.  Lungs clear bilaterally, normal respiratory effort. CV.  Regular rate and rhythm, no JVD, rub or murmur. Abdomen.  Soft, nontender, nondistended, BS positive. CNS.  Awake, appears little anxious, not following most of the commands, not moving right upper and lower extremity Extremities.  No edema, no cyanosis, pulses intact and symmetrical.  Data Reviewed: Prior data reviewed  Family Communication: Talked with daughter on phone.  Disposition: Status is: Inpatient Remains inpatient appropriate because: Severity of illness  Planned Discharge Destination: Rehab  DVT prophylaxis.  Lovenox Time spent: 50 minutes  This record has been created using Conservation officer, historic buildings. Errors have been sought and corrected,but may not always be located. Such creation errors do not reflect on the standard of care.   Author: Arnetha Courser, MD 10/15/2023 3:33 PM  For on call review [email or web address].

## 2023-10-16 DIAGNOSIS — G9341 Metabolic encephalopathy: Secondary | ICD-10-CM | POA: Diagnosis not present

## 2023-10-16 DIAGNOSIS — I959 Hypotension, unspecified: Secondary | ICD-10-CM | POA: Diagnosis not present

## 2023-10-16 DIAGNOSIS — I639 Cerebral infarction, unspecified: Secondary | ICD-10-CM

## 2023-10-16 DIAGNOSIS — E11649 Type 2 diabetes mellitus with hypoglycemia without coma: Secondary | ICD-10-CM | POA: Diagnosis not present

## 2023-10-16 LAB — GLUCOSE, CAPILLARY
Glucose-Capillary: 123 mg/dL — ABNORMAL HIGH (ref 70–99)
Glucose-Capillary: 191 mg/dL — ABNORMAL HIGH (ref 70–99)
Glucose-Capillary: 167 mg/dL — ABNORMAL HIGH (ref 70–99)
Glucose-Capillary: 171 mg/dL — ABNORMAL HIGH (ref 70–99)

## 2023-10-16 NOTE — Progress Notes (Signed)
Progress Note   Patient: Michele House:[id number] DOB: 09/20/1956 DOA: 10/02/2023     14 DOS: the patient was seen and examined on 10/16/2023   Brief hospital course: Taken from H&P.  Michele House is a 67 y.o. female with medical history significant of COPD, chronic respiratory failure on 2 L, hypertension, type 2 diabetes, HFpEF presenting with acute on chronic respiratory failure with hypoxia, COPD exacerbation, pneumonia, UTI, encephalopathy.  Patient reports generalized malaise over the past 3 to 4 days.  Patient noted to have been recently admitted in the hospital November 21 through November 24 for similar issues including COPD exacerbation.  Patient reports not taking her medication since discharge.  Presented to the ER afebrile, heart rate 100s, BP stable.  Satting 96% on 3 L nasal cannula.  White count 23.4, hemoglobin 15, platelets 401, lactate 1.6, procalcitonin less than 0.1, troponin 18, urinalysis indicative of infection.  Urine drug screen negative.  Chest x-ray with a right sided pneumonia.  BNP within normal limits.  Creatinine 1.03.  Glucose 368.EKG sinus tachycardia.  Admitted for concern of pneumonia/COPD exacerbation and UTI. Started on Rocephin and azithromycin.  12/1: Vital stable, respiratory viral panel negative, procalcitonin negative, preliminary blood cultures negative.  Urine cultures are ordered as add-on, ammonia levels mildly elevated at 39, strep pneumo negative, CBG elevated at 244, history of enlarging pulmonary nodule with recommendations for PET/CT during most recent admission which has not been done yet, if video bronchoscopy was scheduled for 10/13/2023 as outpatient.  Unable to take care of himself and son cannot provide the required care as she required 24-hour supervision due to worsening dementia.  PT is recommending SNF.  12/2: Hemodynamically stable, now on baseline oxygen of 2 L.  Needs SNF placement. Urine cultures pending.  12/3:  Blood pressure started trending up, restarting home Zestoretic.  Urine cultures with strep agalactiae, penicillin allergy noted, will continue with ceftriaxone for now.  Also started on Remeron for concern of worsening depression and unable to sleep at night  12/4.  Some very concerned about patient's mental status not being seen.  Patient receiving antibiotics already and should have improved by now.  Will hold Lipitor.  Restart low-dose Zoloft.  Discontinue Remeron and do Seroquel at night. 12/5.  Patient has some blurry vision and weakness and pain.  MRI of the brain negative for acute intracranial abnormality. 12/6.  Patient stated she slept well.  Patient able to lift up her arms up off the bed today.  Patient thought she heard her son's voice outside the room. 12/7.  Patient sitting up in chair when I saw her.  Was able to feed herself breakfast.  Felt okay.  Did not offer any complaints. 12/8.  Patient more confused today than yesterday.  Able to follow some simple commands but not as good of a conversation today as yesterday. 12/9.  Patient still confused today.  Does not follow simple commands today. 12/10.  Patient more talkative today but was looking up at the ceiling when she was talking with me.  She does not move her extremities to command but does move them on her own.  Physical therapy and Occupational Therapy did not see her move her right arm. 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  Mild associated petechial hemorrhage.  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 12/12: Patient with new stroke, CTA head and neck with no large vessel occlusion.  Patient had a recent echocardiogram which was normal. CSF cultures remain negative.  Lipid panel with increased triglyceride at 347, HDL 29 and LDL of  38.  CBC with mild thrombocytopenia at 130, BMP mostly unremarkable.  Patient with significant right-sided weakness. Concern of paraneoplastic versus hypercoagulability secondary to malignancy.  Patient missed her appointment for bronchoscopy and outpatient appointment for PET scan due to recurrent hospitalizations. Involving pulmonary and oncology for their input. Send out encephalitis panel still pending.  12/13: Patient continued to have waxing and waning mental status, unable to move right upper and lower extremities.  Having some hallucination.  Pulmonary is planning lung biopsy on 12/27 if she remains stable.  Rapidly progressive dementia and now with underlying stroke, question of paraneoplastic encephalitis and hypercoagulability secondary to underlying undiagnosed malignancy. Patient will also get benefit from neuropsych evaluation as outpatient.  12/14: Patient with some improved mentation today.  Trying to squeeze with right hand but still no movements of right lower extremity.   Assessment and Plan: * Acute metabolic encephalopathy Waxing and waning mental status, having some hallucination.   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   Discontinued Lipitor.  Patient has elevated vitamin B12, nonreactive RPR, TSH in the normal range.  Potentially has a progressive dementia versus limbic encephalitis versus autoimmune encephalitis versus leptomeningeal spread from spiculated nodule.  Lumbar puncture done 12/6 with testing sent to the [facility].  Patient completed 5 days of high-dose Solu-Medrol (today will be day 5).  Repeat ABG does not show CO2 retention.  Repeat EEG does not show any seizure activity.    No definitive diagnosis yet.  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  CVA (cerebral  vascular accident) (HCC) Repeat MRI with bilateral stroke in watershed area. Significant bilateral weakness, right more than left. Neurology is on board. Concern of paraneoplastic versus hypercoagulable state with underlying malignancy ?? Lower extremity venous Doppler was negative for DVT so no need for TEE -Patient was started on Plavix  Uncontrolled type 2 diabetes mellitus with hypoglycemia, without long-term current use of insulin (HCC) CBG elevated.  Patient completed 5-day course of high-dose steroid. -Increasing Semglee to 15 unit daily -Continue with SSI  Hypotension Blood pressure within goal.  Holding antihypertensive medications  Hypernatremia Resolved. -Continue to monitor  Acute on chronic respiratory failure with hypoxia (HCC) Patient initially required 3 L now on 1 L.  Lung nodule 1.3 cm right upper lobe spiculated in nature.  Could be a primary lung cancerous process.  Will need outpatient PET scan and likely bronchoscopy for diagnosis.  Spoke with Dr. Karna Christmas today and she has missed a few outpatient appointments.  COPD exacerbation (HCC) Resolved  Pneumonia Completed 5 days of Rocephin and Zithromax  UTI (urinary tract infection) Streptococcus agalactiae growing out of urine culture.  Completed Rocephin  (HFpEF) heart failure with preserved ejection fraction (HCC) Chronic in nature.  Not in acute exacerbation.  No signs of heart failure currently.  2D echo 09/2023 showed LVEF 55 to 60%, no regional wall motion abnormalities    Essential hypertension Holding antihypertensive medications with hypotension.  Type 2 diabetes mellitus with obesity (HCC) Sugars high now that she is on high-dose Solu-Medrol.  BMI 30.88   Subjective: Patient was seen and examined today.  Denies any  pain.  No new concern.  She told me that she is in hospital.  Feeling hungry and wants to be fed.  Physical Exam: Vitals:   10/16/23 0055 10/16/23 0505 10/16/23 0848 10/16/23  1125  BP: 128/65 (!) 119/59 114/63 (!) 126/59  Pulse: 94 82 83 81  Resp: 18 20 20 20   Temp: 97.9 F (36.6 C) 98.3 F (36.8 C) 98.6 F (37 C) 98 F (36.7 C)  TempSrc: Oral     SpO2: 96% 100% 99% 98%  Weight:      Height:       General.  Frail elderly lady, in no acute distress. Pulmonary.  Lungs clear bilaterally, normal respiratory effort. CV.  Regular rate and rhythm, no JVD, rub or murmur. Abdomen.  Soft, nontender, nondistended, BS positive. CNS.  Alert and oriented x 2.  1/5 in right upper and 0/5 in right lower extremity Extremities.  No edema, no cyanosis, pulses intact and symmetrical.   Data Reviewed: Prior data reviewed  Family Communication:   Disposition: Status is: Inpatient Remains inpatient appropriate because: Severity of illness  Planned Discharge Destination: Rehab  DVT prophylaxis.  Lovenox Time spent: 45 minutes  This record has been created using Conservation officer, historic buildings. Errors have been sought and corrected,but may not always be located. Such creation errors do not reflect on the standard of care.   Author: Arnetha Courser, MD 10/16/2023 3:27 PM  For on call review [email or web address].

## 2023-10-16 NOTE — Plan of Care (Signed)
  Problem: Education: Goal: Ability to describe self-care measures that may prevent or decrease complications (Diabetes Survival Skills Education) will improve Outcome: Progressing   Problem: Coping: Goal: Ability to adjust to condition or change in health will improve Outcome: Progressing   Problem: Metabolic: Goal: Ability to maintain appropriate glucose levels will improve Outcome: Progressing   Problem: Tissue Perfusion: Goal: Adequacy of tissue perfusion will improve Outcome: Progressing   Problem: Education: Goal: Knowledge of General Education information will improve Description: Including pain rating scale, medication(s)/side effects and non-pharmacologic comfort measures Outcome: Progressing   Problem: Safety: Goal: Ability to remain free from injury will improve Outcome: Progressing   Problem: Skin Integrity: Goal: Risk for impaired skin integrity will decrease Outcome: Progressing

## 2023-10-16 NOTE — Progress Notes (Signed)
Patient brought to floor by RN on bed, A/O x 3. Iv intact, on 2 L of O2. Orientated to room and call bell. Made comfortable in bed. All personal belonging at bed side, walker in the room, cell phone and eye glasses on the bed side table.

## 2023-10-17 DIAGNOSIS — G9341 Metabolic encephalopathy: Secondary | ICD-10-CM | POA: Diagnosis not present

## 2023-10-17 DIAGNOSIS — E11649 Type 2 diabetes mellitus with hypoglycemia without coma: Secondary | ICD-10-CM | POA: Diagnosis not present

## 2023-10-17 DIAGNOSIS — I959 Hypotension, unspecified: Secondary | ICD-10-CM | POA: Diagnosis not present

## 2023-10-17 DIAGNOSIS — I639 Cerebral infarction, unspecified: Secondary | ICD-10-CM | POA: Diagnosis not present

## 2023-10-17 LAB — GLUCOSE, CAPILLARY
Glucose-Capillary: 147 mg/dL — ABNORMAL HIGH (ref 70–99)
Glucose-Capillary: 311 mg/dL — ABNORMAL HIGH (ref 70–99)
Glucose-Capillary: 231 mg/dL — ABNORMAL HIGH (ref 70–99)
Glucose-Capillary: 98 mg/dL (ref 70–99)

## 2023-10-17 MED ORDER — TRAMADOL HCL 50 MG PO TABS
50.0000 mg | ORAL_TABLET | Freq: Four times a day (QID) | ORAL | Status: DC | PRN
  Administered 2023-10-17 – 2023-10-27 (×8): 50 mg via ORAL
  Filled 2023-10-17 (×9): qty 1

## 2023-10-17 MED ORDER — ROPINIROLE HCL ER 4 MG PO TB24
4.0000 mg | ORAL_TABLET | Freq: Every day | ORAL | Status: DC
  Administered 2023-10-17 – 2023-12-01 (×39): 4 mg via ORAL
  Filled 2023-10-17 (×59): qty 1

## 2023-10-17 NOTE — Progress Notes (Signed)
Speech Language Pathology Treatment: Dysphagia;Cognitive-Linquistic  Patient Details Name: Michele House MRN: [id number] DOB: 27-Jul-1956 Today's Date: 10/17/2023 Time: 4098-1191 SLP Time Calculation (min) (ACUTE ONLY): 18 min  Assessment / Plan / Recommendation Clinical Impression  Upon SLP entrance to room, RN and NT present. Pt just finished breakfast without difficulty per NT. Limited PO intake noted. Pt on 2L/min O2 via [place]. Pt denied difficulty swallowing.  Pt seen for diet tolerance / PO trials for diet advancement. Per SLP observation and NT report, pt tolerating Dysphagia 2 Diet with Thin Liquids without overt s/sx pharyngeal dysphagia. Advanced texture, regular solid, given. Pt with mildly prolonged mastication. Otherwise, WFL. Pt utilized thin liquids via cup per pt preference between solid trials. No overt s/sx pharyngeal dysphagia noted.   Recommend diet upgrade to Dysphagia 3 with Thin Liquids with safe swallowing strategies/aspiration precautions as outlined below. Pt is at increased risk for aspiration/aspiration PNA given fluctuations in mental status, overall debility, medical comorbidities, need for A with feeding, and dental status.  Pt seen for cognitive-communication tx. Pt alert, slow to respond, flat affect. Easily distracted by environmental stimuli. Pt inconsistent ability to follow commands. Pt oriented to self and place only. ?pt's premorbid functional status. Recommend further cognitive-linguistic evaluation which was deferred this date as pt needed to initiate a nebulizer treatment with RN.  SLP to continue to f/u per POC.    HPI HPI: Pt is a 67 y/o F admitted on 10/02/23 after presenting with acute on chronic respiratory failure with hypoxia, COPD exacerbation, PNA, UTI, & encephalopathy. PMH: COPD, chronic respiratory failure on 2L,HTN, DM2, HFpEF. Head CT/CTA, 12/11, "CT HEAD:     1. Evolving acute to early subacute ischemic infarcts involving the  posterior  parieto-occipital regions, stable from prior MRI. No  associated hemorrhage evident by CT. No significant regional mass  effect.  2. No other new acute intracranial abnormality.  3. 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 No hydrocephalus.  4. Age-related cerebral atrophy with mild chronic microvascular  ischemic disease.     CTA HEAD AND NECK:     1. Negative CTA for large vessel occlusion or other emergent  finding.  2. 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  3. 1.7 cm spiculated right upper lobe pulmonary nodule. Again,  further evaluation with dedicated PET-CT recommended."      SLP Plan  Continue with current plan of care      Recommendations for follow up therapy are one component of a multi-disciplinary discharge planning process, led by the attending physician.  Recommendations may be updated based on patient status, additional functional criteria and insurance authorization.    Recommendations  Diet recommendations: Dysphagia 3 (mechanical soft);Thin liquid Liquids provided via: Teaspoon;Cup;Straw Medication Administration: Whole meds with puree Supervision: Staff to assist with self feeding;Trained caregiver to feed patient;Full supervision/cueing for compensatory strategies Compensations: Minimize environmental distractions;Slow rate;Small sips/bites Postural Changes and/or Swallow Maneuvers: Out of bed for meals;Seated upright 90 degrees;Upright 30-60 min after meal                  Oral care QID;Staff/trained caregiver to provide oral care   Frequent or constant Supervision/Assistance Cognitive communication deficit (R41.841);Dysphagia, oral phase (R13.11)     Continue with current plan of care    Michele House, M.S., CCC-SLP Speech-Language Pathologist [facility] [phone] (ASCOM)  Michele House  10/17/2023, 10:24 AM

## 2023-10-17 NOTE — Progress Notes (Signed)
Progress Note   Patient: Michele House:[id number] DOB: Aug 05, 1956 DOA: 10/02/2023     15 DOS: the patient was seen and examined on 10/17/2023   Brief hospital course: Taken from H&P.  HUDSON POOT is a 67 y.o. female with medical history significant of COPD, chronic respiratory failure on 2 L, hypertension, type 2 diabetes, HFpEF presenting with acute on chronic respiratory failure with hypoxia, COPD exacerbation, pneumonia, UTI, encephalopathy.  Patient reports generalized malaise over the past 3 to 4 days.  Patient noted to have been recently admitted in the hospital November 21 through November 24 for similar issues including COPD exacerbation.  Patient reports not taking her medication since discharge.  Presented to the ER afebrile, heart rate 100s, BP stable.  Satting 96% on 3 L nasal cannula.  White count 23.4, hemoglobin 15, platelets 401, lactate 1.6, procalcitonin less than 0.1, troponin 18, urinalysis indicative of infection.  Urine drug screen negative.  Chest x-ray with a right sided pneumonia.  BNP within normal limits.  Creatinine 1.03.  Glucose 368.EKG sinus tachycardia.  Admitted for concern of pneumonia/COPD exacerbation and UTI. Started on Rocephin and azithromycin.  12/1: Vital stable, respiratory viral panel negative, procalcitonin negative, preliminary blood cultures negative.  Urine cultures are ordered as add-on, ammonia levels mildly elevated at 39, strep pneumo negative, CBG elevated at 244, history of enlarging pulmonary nodule with recommendations for PET/CT during most recent admission which has not been done yet, if video bronchoscopy was scheduled for 10/13/2023 as outpatient.  Unable to take care of himself and son cannot provide the required care as she required 24-hour supervision due to worsening dementia.  PT is recommending SNF.  12/2: Hemodynamically stable, now on baseline oxygen of 2 L.  Needs SNF placement. Urine cultures pending.  12/3:  Blood pressure started trending up, restarting home Zestoretic.  Urine cultures with strep agalactiae, penicillin allergy noted, will continue with ceftriaxone for now.  Also started on Remeron for concern of worsening depression and unable to sleep at night  12/4.  Some very concerned about patient's mental status not being seen.  Patient receiving antibiotics already and should have improved by now.  Will hold Lipitor.  Restart low-dose Zoloft.  Discontinue Remeron and do Seroquel at night. 12/5.  Patient has some blurry vision and weakness and pain.  MRI of the brain negative for acute intracranial abnormality. 12/6.  Patient stated she slept well.  Patient able to lift up her arms up off the bed today.  Patient thought she heard her son's voice outside the room. 12/7.  Patient sitting up in chair when I saw her.  Was able to feed herself breakfast.  Felt okay.  Did not offer any complaints. 12/8.  Patient more confused today than yesterday.  Able to follow some simple commands but not as good of a conversation today as yesterday. 12/9.  Patient still confused today.  Does not follow simple commands today. 12/10.  Patient more talkative today but was looking up at the ceiling when she was talking with me.  She does not move her extremities to command but does move them on her own.  Physical therapy and Occupational Therapy did not see her move her right arm. 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  Mild associated petechial hemorrhage.  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 12/12: Patient with new stroke, CTA head and neck with no large vessel occlusion.  Patient had a recent echocardiogram which was normal. CSF cultures remain negative.  Lipid panel with increased triglyceride at 347, HDL 29 and LDL of  38.  CBC with mild thrombocytopenia at 130, BMP mostly unremarkable.  Patient with significant right-sided weakness. Concern of paraneoplastic versus hypercoagulability secondary to malignancy.  Patient missed her appointment for bronchoscopy and outpatient appointment for PET scan due to recurrent hospitalizations. Involving pulmonary and oncology for their input. Send out encephalitis panel still pending.  12/13: Patient continued to have waxing and waning mental status, unable to move right upper and lower extremities.  Having some hallucination.  Pulmonary is planning lung biopsy on 12/27 if she remains stable.  Rapidly progressive dementia and now with underlying stroke, question of paraneoplastic encephalitis and hypercoagulability secondary to underlying undiagnosed malignancy. Patient will also get benefit from neuropsych evaluation as outpatient.  12/14: Patient with some improved mentation today.  Trying to squeeze with right hand but still no movements of right lower extremity.  12/15: Patient with much improved mentation.  Having some flickering of movements on right upper and lower extremity today, left extremities weaker but seems improving.    Assessment and Plan: * Acute metabolic encephalopathy Much improved mentation and talking appropriately today   Repeat MRI on 12/11 with multiple acute small infarcts involving bilateral frontal and parietal territory, a small petechial hemorrhage.   Discontinued Lipitor.  Patient has elevated vitamin B12, nonreactive RPR, TSH in the normal range.  Potentially has a progressive dementia versus limbic encephalitis versus autoimmune encephalitis versus leptomeningeal spread from spiculated nodule.  Lumbar puncture done 12/6 with testing sent to the [facility]  Patient completed 5 days of high-dose Solu-Medrol (today will be day 5).  Repeat ABG does not show CO2 retention.  Repeat EEG does not show any seizure activity.    No definitive  diagnosis yet.  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  CVA (cerebral vascular accident) (HCC) Repeat MRI with bilateral stroke in watershed area. Significant bilateral weakness, right more than left. Neurology is on board. Concern of paraneoplastic versus hypercoagulable state with underlying malignancy ?? Lower extremity venous Doppler was negative for DVT so no need for TEE -Patient was started on Plavix, if she was proven to have underlying malignancy and hypercoagulable state then she will need anticoagulation instead of Plavix per neurology  Uncontrolled type 2 diabetes mellitus with hypoglycemia, without long-term current use of insulin (HCC) CBG elevated.  Patient completed 5-day course of high-dose steroid. -Increasing Semglee to 15 unit daily -Continue with SSI  Hypotension Blood pressure within goal.  Holding antihypertensive medications  Hypernatremia Resolved. -Continue to monitor  Acute on chronic respiratory failure with hypoxia (HCC) Patient initially required 3 L now on 1 L.  Lung nodule 1.3 cm right upper lobe spiculated in nature.  Could be a primary lung cancerous process.  Will need outpatient PET scan and likely bronchoscopy for diagnosis.  Spoke with Dr. Karna Christmas today and she has missed a few outpatient appointments.  COPD exacerbation (HCC) Resolved  Pneumonia Completed 5 days of Rocephin and Zithromax  UTI (urinary tract infection) Streptococcus agalactiae growing out of urine culture.  Completed Rocephin  (HFpEF) heart failure with preserved ejection fraction (HCC) Chronic in nature.  Not in acute exacerbation.  No signs of heart failure currently.  2D echo 09/2023 showed LVEF 55 to 60%,  no regional wall motion abnormalities    Essential hypertension Holding antihypertensive medications with hypotension.  Type 2 diabetes mellitus with obesity  (HCC) Sugars high now that she is on high-dose Solu-Medrol.  BMI 30.88   Subjective: Patient's mentation seems much improved today.  She was also trying to move right upper and lower extremities with some flickers of movements.  Proving strength on left.  She was complaining of bilateral leg pain and some legs irritability overnight.  Physical Exam: Vitals:   10/17/23 0553 10/17/23 0802 10/17/23 1139 10/17/23 1440  BP: 126/68 121/63 (!) 105/57 (!) 101/59  Pulse: 78 79 98 89  Resp: 19 17 16 17   Temp: 98.4 F (36.9 C) 97.6 F (36.4 C) 98 F (36.7 C) 97.6 F (36.4 C)  TempSrc: Oral     SpO2: 99% 98% 98% 98%  Weight:      Height:       General.  Frail elderly lady, in no acute distress. Pulmonary.  Lungs clear bilaterally, normal respiratory effort. CV.  Regular rate and rhythm, no JVD, rub or murmur. Abdomen.  Soft, nontender, nondistended, BS positive. CNS.  Alert and oriented .  1/5 on right upper and lower extremity.  4/5 on left Extremities.  No edema, no cyanosis, pulses intact and symmetrical. Psychiatry.  Judgment and insight appears normal.    Data Reviewed: Prior data reviewed  Family Communication: Talked with daughter on phone.  Disposition: Status is: Inpatient Remains inpatient appropriate because: Severity of illness  Planned Discharge Destination: Rehab  DVT prophylaxis.  Lovenox Time spent: 44 minutes  This record has been created using Conservation officer, historic buildings. Errors have been sought and corrected,but may not always be located. Such creation errors do not reflect on the standard of care.   Author: Arnetha Courser, MD 10/17/2023 4:25 PM  For on call review [email or web address].

## 2023-10-17 NOTE — Progress Notes (Addendum)
NEUROLOGY CONSULT FOLLOW UP NOTE   Date of service: October 17, 2023 Patient Name: Michele House MRN:  [id number] DOB:  03/26/1956  Interval Hx/subjective  Patient seen and examined. She is sitting in her bed awake alert having full conversations with me today.   Vitals   Vitals:   10/16/23 2015 10/17/23 0012 10/17/23 0553 10/17/23 0802  BP: 133/72 128/70 126/68 121/63  Pulse: 88 82 78 79  Resp: 18 18 19 17   Temp: 98.3 F (36.8 C) 98.2 F (36.8 C) 98.4 F (36.9 C) 97.6 F (36.4 C)  TempSrc: Oral Oral Oral   SpO2: 99% 98% 99% 98%  Weight:      Height:         Body mass index is 30.88 kg/m.  Physical Exam  General: Well-developed well-nourished in no acute distress comfortably sitting in bed having full conversations following all commands HEENT: Normocephalic atraumatic Lungs: On supplemental oxygen saturating well CVS: Regular rate rhythm Neurological exam She is awake alert oriented x 3 Her speech is not dysarthric there is no evidence of aphasia as well. Cranial nerves: Pupils are equal round react light, extraocular movements are unhindered, she brings with her from both sides and is able to count fingers on both sides as well, face appears grossly symmetric, tongue and palate appear midline. Motor examination: Flaccid right upper extremity and nearly flaccid right lower extremity.  Left side-upper extremity is full strength, left lower extremity is at least 4/5. Sensation is diminished on the right hemibody in comparison to the left with no extinction Coordination: No dysmetria on the left.  Unable to perform on the right   Medications  Current Facility-Administered Medications:    acetaminophen (TYLENOL) tablet 650 mg, 650 mg, Oral, Q6H PRN, Floydene Flock, MD, 650 mg at 10/17/23 0915   clopidogrel (PLAVIX) tablet 75 mg, 75 mg, Oral, Daily, Milon Dikes, MD, 75 mg at 10/17/23 0900   dextromethorphan-guaiFENesin (MUCINEX DM) 30-600 MG per 12 hr tablet 1  tablet, 1 tablet, Oral, BID PRN, Floydene Flock, MD   enoxaparin (LOVENOX) injection 40 mg, 40 mg, Subcutaneous, Daily, Wieting, Richard, MD, 40 mg at 10/17/23 0900   insulin aspart (novoLOG) injection 0-15 Units, 0-15 Units, Subcutaneous, TID WC, Wieting, Richard, MD, 2 Units at 10/17/23 0901   insulin aspart (novoLOG) injection 0-5 Units, 0-5 Units, Subcutaneous, QHS, Wieting, Richard, MD, 2 Units at 10/15/23 2219   insulin glargine-yfgn (SEMGLEE) injection 15 Units, 15 Units, Subcutaneous, QHS, Amin, Tilman Neat, MD, 15 Units at 10/16/23 2202   ipratropium-albuterol (DUONEB) 0.5-2.5 (3) MG/3ML nebulizer solution 3 mL, 3 mL, Nebulization, Q4H PRN, Floydene Flock, MD, 3 mL at 10/17/23 0917   levothyroxine (SYNTHROID) tablet 100 mcg, 100 mcg, Oral, Q0600, Floydene Flock, MD, 100 mcg at 10/17/23 0533   loratadine (CLARITIN) tablet 10 mg, 10 mg, Oral, Daily, Floydene Flock, MD, 10 mg at 10/17/23 0900   ondansetron (ZOFRAN) tablet 4 mg, 4 mg, Oral, Q6H PRN **OR** ondansetron (ZOFRAN) injection 4 mg, 4 mg, Intravenous, Q6H PRN, Floydene Flock, MD   Oral care mouth rinse, 15 mL, Mouth Rinse, PRN, Renae Gloss, Richard, MD   QUEtiapine (SEROQUEL) tablet 12.5 mg, 12.5 mg, Oral, QHS PRN, Alford Highland, MD, 12.5 mg at 10/16/23 2207   sertraline (ZOLOFT) tablet 25 mg, 25 mg, Oral, Daily, Wieting, Richard, MD, 25 mg at 10/17/23 0900  Labs and Diagnostic Imaging   CBC:  Recent Labs  Lab 10/11/23 0432 10/14/23 0513  WBC 9.2 10.2  HGB  13.1 12.1  HCT 41.2 37.4  MCV 90.4 90.3  PLT 223 130*   LDL 38 A1c was 7.14 months ago.  Basic Metabolic Panel:  Lab Results  Component Value Date   NA 142 10/14/2023   K 3.9 10/14/2023   CO2 29 10/14/2023   GLUCOSE 172 (H) 10/14/2023   BUN 44 (H) 10/14/2023   CREATININE 0.91 10/14/2023   CALCIUM 8.5 (L) 10/14/2023   GFRNONAA >60 10/14/2023   GFRAA >60 08/19/2017   MRI Brain12/4/24 1. No acute intracranial abnormality. 2. Confluent hyperintense  T2-weighted signal within the superior bihemispheric white matter, most commonly indicating chronic microvascular ischemia.   MR brain repeated 10/13/23 IMPRESSION: 1. Multiple acute infarcts in the high bilateral posterior frontal and parietal lobes, potentially watershed territory. Mild associated petechial hemorrhage. 2. 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  CT angio head and neck  10/13/23  with no emergent LVO.  Atheromatous changes about the carotid bifurcations bilaterally up to 50% stenosis.  rEEG 12/6 showed mild diffuse slowing without epileptiform abnormalities. Repeat EEG on 10/11/23 also suggestive of diffuse slowing without seizures or epileptiform discharges.  No evidence of DVT on lower extremity Dopplers  Assessment  67 year old woman with past medical history significant for COPD, chronic respiratory failure on 2 L of home O2, hypertension, type 2 diabetes, heart failure with preserved ejection fraction who presented with acute on chronic respiratory failure with hypoxia, COPD exacerbation, pneumonia, and UTI. Neurology was consulted for altered mental status.   Due to her acute delirium and altered mental status and the fact that she had pretty rapid decline in her cognition, LP was done which was essentially unremarkable but due to high suspicion of this being some sort of an autoimmune/paraneoplastic process because of CTA of the chest with right upper lobe spiculated nodule, she was given 5 days of steroids which has really not made much of a change.  Repeat imaging with MRI because of more pronounced right-sided weakness was performed-the MRI shows bilateral frontoparietal infarcts almost in a watershed pattern without any significant stenosis in the head and neck vessels  Workup for reversible causes of dementia has been unremarkable  Her pulmonary nodule has increased in size from 3 months ago  Now she has strokes with unclear  etiology-no evidence of A-fib, no significant narrowing in the blood vessels of head and neck which makes you wonder if she has hypercoagulability from an underlying malignant process and mentation is also related to a paraneoplastic type of encephalitis  She has had a spinal tap done which has been bland.  Send out labs for autoimmune encephalopathy in the CSF and serum are pending. She has also completed 5 days of steroid for presumed autoimmune encephalitis without much benefit  Her mental status today is remarkably improved.  She has right hemiplegia.  Question if the mental status is improved in response to the steroids that she received with the last dose of steroids a couple of days ago.  Autoimmune encephalitis still remains in the differential although I do not have any objective evidence in the send out labs are still pending.   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 New acute ischemic strokes raising suspicion for hypercoagulability-no clear etiology of the stroke identifiably   Recommendations  Follow-up the send out autoimmune encephalopathy labs outpatient. For stroke prevention-started her on Plavix.  She is allergic to aspirin. If it is deemed that her pulmonary nodule is malignant and she is hypercoagulable, then she will need anticoagulation for stroke prevention and not Plavix. She will be followed by pulmonology outpatient for a biopsy. I would recommend neurology follow-up in 4 weeks after discharge.  Plan discussed with Dr. Nelson Chimes  Inpatient neurology will be available as needed.  Please call with questions. -- Milon Dikes, MD Neurologist [facility]

## 2023-10-17 NOTE — Progress Notes (Signed)
PULMONOLOGY         Date: 10/17/2023,   MRN# [id number] Michele House 29-Oct-1956     AdmissionWeight: 74.1 kg                 CurrentWeight: 74.1 kg  Referring provider: Dr Nelson Chimes   CHIEF COMPLAINT:   Lung nodule suspicious for carcinoma   HISTORY OF PRESENT ILLNESS   Patient with COPD, dyslipidemia, HTN, thyroid dysfunction, GERD, She has 1.5cm RUL nodule suspicious for primary bronchogenic carcinoma. She needs to have tissue diagnosis. PCCM consultation for biopsy.   10/15/23- patient encepalopathic on examination.  Have ordered ABG, Uric acid, ammonia levels, renal function is normal, electrolytes normal , sugars improved <200.  Neuro and oncology following, s/p LP, s/p paraneoplastic panel, s/p autoimmune panel.  Planning for lung biopsy on 10/29/23 if patient is more stable and safe candidate for general anesthesia  10/17/23- patient awake and alert, she is able to answer questions appropriately.  Encephalopathy has improved markedly. We discussed lung biopsy she has consented to procedure and wishes to proceed as planned.   PAST MEDICAL HISTORY   Past Medical History:  Diagnosis Date   Acid reflux    COPD (chronic obstructive pulmonary disease) (HCC)    Diabetes mellitus without complication (HCC)    Hyperlipidemia    Hypertension    Thyroid disease      SURGICAL HISTORY   Past Surgical History:  Procedure Laterality Date   appendectomy     APPENDECTOMY     BREAST BIOPSY Right    CORE W/CLIP - NEG   TOTAL VAGINAL HYSTERECTOMY     TUMOR REMOVAL     benign;stomach     FAMILY HISTORY   Family History  Problem Relation Age of Onset   Heart attack Mother    Hypertension Mother    Breast cancer Sister 78   Breast cancer Maternal Aunt        40'S   Breast cancer Maternal Grandmother      SOCIAL HISTORY   Social History   Tobacco Use   Smoking status: Former    Current packs/day: 0.00    Types: Cigarettes    Quit date: 12/15/1985     Years since quitting: 37.8   Smokeless tobacco: Never  Substance Use Topics   Alcohol use: No   Drug use: No     MEDICATIONS    Home Medication:    Current Medication:  Current Facility-Administered Medications:    acetaminophen (TYLENOL) tablet 650 mg, 650 mg, Oral, Q6H PRN, Floydene Flock, MD, 650 mg at 10/17/23 0915   clopidogrel (PLAVIX) tablet 75 mg, 75 mg, Oral, Daily, Milon Dikes, MD, 75 mg at 10/17/23 0900   dextromethorphan-guaiFENesin (MUCINEX DM) 30-600 MG per 12 hr tablet 1 tablet, 1 tablet, Oral, BID PRN, Floydene Flock, MD   enoxaparin (LOVENOX) injection 40 mg, 40 mg, Subcutaneous, Daily, Wieting, Richard, MD, 40 mg at 10/17/23 0900   insulin aspart (novoLOG) injection 0-15 Units, 0-15 Units, Subcutaneous, TID WC, Wieting, Richard, MD, 2 Units at 10/17/23 0901   insulin aspart (novoLOG) injection 0-5 Units, 0-5 Units, Subcutaneous, QHS, Wieting, Richard, MD, 2 Units at 10/15/23 2219   insulin glargine-yfgn (SEMGLEE) injection 15 Units, 15 Units, Subcutaneous, QHS, Amin, Tilman Neat, MD, 15 Units at 10/16/23 2202   ipratropium-albuterol (DUONEB) 0.5-2.5 (3) MG/3ML nebulizer solution 3 mL, 3 mL, Nebulization, Q4H PRN, Floydene Flock, MD, 3 mL at 10/17/23 0917   levothyroxine (SYNTHROID) tablet 100  mcg, 100 mcg, Oral, Q0600, Floydene Flock, MD, 100 mcg at 10/17/23 0533   loratadine (CLARITIN) tablet 10 mg, 10 mg, Oral, Daily, Floydene Flock, MD, 10 mg at 10/17/23 0900   ondansetron (ZOFRAN) tablet 4 mg, 4 mg, Oral, Q6H PRN **OR** ondansetron (ZOFRAN) injection 4 mg, 4 mg, Intravenous, Q6H PRN, Floydene Flock, MD   Oral care mouth rinse, 15 mL, Mouth Rinse, PRN, Renae Gloss, Richard, MD   QUEtiapine (SEROQUEL) tablet 12.5 mg, 12.5 mg, Oral, QHS PRN, Alford Highland, MD, 12.5 mg at 10/16/23 2207   rOPINIRole (REQUIP XL) 24 hr tablet 4 mg, 4 mg, Oral, QHS, Arnetha Courser, MD   sertraline (ZOLOFT) tablet 25 mg, 25 mg, Oral, Daily, Wieting, Richard, MD, 25 mg at  10/17/23 0900   traMADol (ULTRAM) tablet 50 mg, 50 mg, Oral, Q6H PRN, Arnetha Courser, MD    ALLERGIES   Penicillins, Aspirin, and Sulfa antibiotics     REVIEW OF SYSTEMS    Review of Systems:  Gen:  Denies  fever, sweats, chills weigh loss  HEENT: Denies blurred vision, double vision, ear pain, eye pain, hearing loss, nose bleeds, sore throat Cardiac:  No dizziness, chest pain or heaviness, chest tightness,edema Resp:   reports dyspnea chronically  Gi: Denies swallowing difficulty, stomach pain, nausea or vomiting, diarrhea, constipation, bowel incontinence Gu:  Denies bladder incontinence, burning urine Ext:   Denies Joint pain, stiffness or swelling Skin: Denies  skin rash, easy bruising or bleeding or hives Endoc:  Denies polyuria, polydipsia , polyphagia or weight change Psych:   Denies depression, insomnia or hallucinations   Other:  All other systems negative   VS: BP (!) 105/57 (BP Location: Left Arm)   Pulse 98   Temp 98 F (36.7 C)   Resp 16   Ht 5' 0.98" (1.549 m)   Wt 74.1 kg   LMP 12/24/1998 Comment: prior to her hysterectomy  SpO2 98%   BMI 30.88 kg/m      PHYSICAL EXAM    GENERAL:NAD, no fevers, chills, no weakness no fatigue HEAD: Normocephalic, atraumatic.  EYES: Pupils equal, round, reactive to light. Extraocular muscles intact. No scleral icterus.  MOUTH: Moist mucosal membrane. Dentition intact. No abscess noted.  EAR, NOSE, THROAT: Clear without exudates. No external lesions.  NECK: Supple. No thyromegaly. No nodules. No JVD.  PULMONARY: decreased breath sounds with mild rhonchi worse at bases bilaterally.  CARDIOVASCULAR: S1 and S2. Regular rate and rhythm. No murmurs, rubs, or gallops. No edema. Pedal pulses 2+ bilaterally.  GASTROINTESTINAL: Soft, nontender, nondistended. No masses. Positive bowel sounds. No hepatosplenomegaly.  MUSCULOSKELETAL: No swelling, clubbing, or edema. Range of motion full in all extremities.  NEUROLOGIC:  Cranial nerves II through XII are intact. No gross focal neurological deficits. Sensation intact. Reflexes intact.  SKIN: No ulceration, lesions, rashes, or cyanosis. Skin warm and dry. Turgor intact.  PSYCHIATRIC: Mood, affect within normal limits. The patient is awake, alert and oriented x 3. Insight, judgment intact.       IMAGING   CT chest -  Enlarging spiculated nodule in the inferior right upper lobe now 1.6 cm compared to 1.3 cm previously.   ASSESSMENT/PLAN   Lung nodule suspicious for primary bronchogenic carcinoma   1.5cm - RUL   -patient would require biopsy - will order bronchoscopy     -Reviewed risks/complications and benefits with patient, risks include infection, pneumothorax/pneumomediastinum which may require chest tube placement as well as overnight/prolonged hospitalization and possible mechanical ventilation. Other risks include bleeding and  very rarely death.  Patient understands risks and wishes to proceed.  Additional questions were answered, and patient is aware that post procedure patient will be going home with family and may experience cough with possible clots on expectoration as well as phlegm which may last few days as well as hoarseness of voice post intubation and mechanical ventilation.      Encephalopathy  S/p neuro and med/onc eval  - ordered ammonia, uric acid, ABG today       Thank you for allowing me to participate in the care of this patient.   Patient/Family are satisfied with care plan and all questions have been answered.    Provider disclosure: Patient with at least one acute or chronic illness or injury that poses a threat to life or bodily function and is being managed actively during this encounter.  All of the below services have been performed independently by signing provider:  review of prior documentation from internal and or external health records.  Review of previous and current lab results.  Interview and comprehensive  assessment during patient visit today. Review of current and previous chest radiographs/CT scans. Discussion of management and test interpretation with health care team and patient/family.   This document was prepared using Dragon voice recognition software and may include unintentional dictation errors.     Vida Rigger, M.D.  Division of Pulmonary & Critical Care Medicine

## 2023-10-17 NOTE — TOC Progression Note (Signed)
Transition of Care [facility]) - Progression Note    Patient Details  Name: Michele House MRN: [id number] Date of Birth: 06-20-1956  Transition of Care [facility]) CM/SW Contact  Rodney Langton, RN Phone Number: 10/17/2023, 2:27 PM  Clinical Narrative:     Call placed to daughter Gunnar Fusi, advised of bed offers ([facility], [facility], [facility], and [facility]).  She was under the impression that bed search was in the [place] area per patients request upon admission.  Attempted to speak with patient regarding bed choice, patient not available.  MD confirms patient is close to being medically stable to discharge, TOC team will start auth process once bed choice is identified.  Expected Discharge Plan: Skilled Nursing Facility Barriers to Discharge: Continued Medical Work up  Expected Discharge Plan and Services                                               Social Determinants of Health (SDOH) Interventions SDOH Screenings   Food Insecurity: Patient Unable To Answer (10/03/2023)  Recent Concern: Food Insecurity - Food Insecurity Present (09/24/2023)  Housing: High Risk (10/03/2023)  Transportation Needs: Patient Unable To Answer (10/03/2023)  Recent Concern: Transportation Needs - Unmet Transportation Needs (09/24/2023)  Utilities: Patient Unable To Answer (10/03/2023)  Tobacco Use: Medium Risk (10/02/2023)    Readmission Risk Interventions     No data to display

## 2023-10-18 DIAGNOSIS — I639 Cerebral infarction, unspecified: Secondary | ICD-10-CM | POA: Diagnosis not present

## 2023-10-18 DIAGNOSIS — I959 Hypotension, unspecified: Secondary | ICD-10-CM | POA: Diagnosis not present

## 2023-10-18 DIAGNOSIS — G9341 Metabolic encephalopathy: Secondary | ICD-10-CM | POA: Diagnosis not present

## 2023-10-18 DIAGNOSIS — E11649 Type 2 diabetes mellitus with hypoglycemia without coma: Secondary | ICD-10-CM | POA: Diagnosis not present

## 2023-10-18 LAB — GLUCOSE, CAPILLARY
Glucose-Capillary: 129 mg/dL — ABNORMAL HIGH (ref 70–99)
Glucose-Capillary: 140 mg/dL — ABNORMAL HIGH (ref 70–99)
Glucose-Capillary: 143 mg/dL — ABNORMAL HIGH (ref 70–99)
Glucose-Capillary: 144 mg/dL — ABNORMAL HIGH (ref 70–99)
Glucose-Capillary: 179 mg/dL — ABNORMAL HIGH (ref 70–99)
Glucose-Capillary: 214 mg/dL — ABNORMAL HIGH (ref 70–99)
Glucose-Capillary: 233 mg/dL — ABNORMAL HIGH (ref 70–99)
Glucose-Capillary: 285 mg/dL — ABNORMAL HIGH (ref 70–99)
Glucose-Capillary: 59 mg/dL — ABNORMAL LOW (ref 70–99)
Glucose-Capillary: 77 mg/dL (ref 70–99)

## 2023-10-18 MED ORDER — METHOCARBAMOL 500 MG PO TABS
500.0000 mg | ORAL_TABLET | Freq: Three times a day (TID) | ORAL | Status: DC | PRN
  Administered 2023-10-18 – 2023-10-28 (×9): 500 mg via ORAL
  Filled 2023-10-18 (×11): qty 1

## 2023-10-18 MED ORDER — DEXTROSE 50 % IV SOLN
25.0000 g | Freq: Once | INTRAVENOUS | Status: AC
  Administered 2023-10-18: 25 g via INTRAVENOUS
  Filled 2023-10-18: qty 50

## 2023-10-18 NOTE — Progress Notes (Signed)
PULMONOLOGY         Date: 10/18/2023,   MRN# [id number] Michele House Sep 26, 1956     AdmissionWeight: 74.1 kg                 CurrentWeight: 74.1 kg  Referring provider: Dr Nelson Chimes   CHIEF COMPLAINT:   Lung nodule suspicious for carcinoma   HISTORY OF PRESENT ILLNESS   Patient with COPD, dyslipidemia, HTN, thyroid dysfunction, GERD, She has 1.5cm RUL nodule suspicious for primary bronchogenic carcinoma. She needs to have tissue diagnosis. PCCM consultation for biopsy.   10/15/23- patient encepalopathic on examination.  Have ordered ABG, Uric acid, ammonia levels, renal function is normal, electrolytes normal , sugars improved <200.  Neuro and oncology following, s/p LP, s/p paraneoplastic panel, s/p autoimmune panel.  Planning for lung biopsy on 10/29/23 if patient is more stable and safe candidate for general anesthesia  10/17/23- patient awake and alert, she is able to answer questions appropriately.  Encephalopathy has improved markedly. We discussed lung biopsy she has consented to procedure and wishes to proceed as planned.  10/18/23- patient reports leg pain.  She wants to get OOB to walk with PT.  She seems a bit sleepy during interview.  I have dcd claritin.   PAST MEDICAL HISTORY   Past Medical History:  Diagnosis Date   Acid reflux    COPD (chronic obstructive pulmonary disease) (HCC)    Diabetes mellitus without complication (HCC)    Hyperlipidemia    Hypertension    Thyroid disease      SURGICAL HISTORY   Past Surgical History:  Procedure Laterality Date   appendectomy     APPENDECTOMY     BREAST BIOPSY Right    CORE W/CLIP - NEG   TOTAL VAGINAL HYSTERECTOMY     TUMOR REMOVAL     benign;stomach     FAMILY HISTORY   Family History  Problem Relation Age of Onset   Heart attack Mother    Hypertension Mother    Breast cancer Sister 52   Breast cancer Maternal Aunt        40'S   Breast cancer Maternal Grandmother      SOCIAL  HISTORY   Social History   Tobacco Use   Smoking status: Former    Current packs/day: 0.00    Types: Cigarettes    Quit date: 12/15/1985    Years since quitting: 37.8   Smokeless tobacco: Never  Substance Use Topics   Alcohol use: No   Drug use: No     MEDICATIONS    Home Medication:    Current Medication:  Current Facility-Administered Medications:    acetaminophen (TYLENOL) tablet 650 mg, 650 mg, Oral, Q6H PRN, Floydene Flock, MD, 650 mg at 10/18/23 0424   clopidogrel (PLAVIX) tablet 75 mg, 75 mg, Oral, Daily, Milon Dikes, MD, 75 mg at 10/18/23 4010   dextromethorphan-guaiFENesin (MUCINEX DM) 30-600 MG per 12 hr tablet 1 tablet, 1 tablet, Oral, BID PRN, Floydene Flock, MD, 1 tablet at 10/18/23 0832   enoxaparin (LOVENOX) injection 40 mg, 40 mg, Subcutaneous, Daily, Wieting, Richard, MD, 40 mg at 10/18/23 0830   insulin aspart (novoLOG) injection 0-15 Units, 0-15 Units, Subcutaneous, TID WC, Wieting, Richard, MD, 2 Units at 10/18/23 0831   insulin aspart (novoLOG) injection 0-5 Units, 0-5 Units, Subcutaneous, QHS, Wieting, Richard, MD, 2 Units at 10/15/23 2219   insulin glargine-yfgn (SEMGLEE) injection 15 Units, 15 Units, Subcutaneous, QHS, Arnetha Courser, MD, 15 Units  at 10/17/23 2218   ipratropium-albuterol (DUONEB) 0.5-2.5 (3) MG/3ML nebulizer solution 3 mL, 3 mL, Nebulization, Q4H PRN, Floydene Flock, MD, 3 mL at 10/17/23 6962   levothyroxine (SYNTHROID) tablet 100 mcg, 100 mcg, Oral, Q0600, Floydene Flock, MD, 100 mcg at 10/18/23 0615   loratadine (CLARITIN) tablet 10 mg, 10 mg, Oral, Daily, Floydene Flock, MD, 10 mg at 10/18/23 0832   ondansetron (ZOFRAN) tablet 4 mg, 4 mg, Oral, Q6H PRN **OR** ondansetron (ZOFRAN) injection 4 mg, 4 mg, Intravenous, Q6H PRN, Floydene Flock, MD, 4 mg at 10/18/23 1129   Oral care mouth rinse, 15 mL, Mouth Rinse, PRN, Alford Highland, MD   QUEtiapine (SEROQUEL) tablet 12.5 mg, 12.5 mg, Oral, QHS PRN, Alford Highland, MD, 12.5  mg at 10/16/23 2207   rOPINIRole (REQUIP XL) 24 hr tablet 4 mg, 4 mg, Oral, QHS, Amin, Tilman Neat, MD, 4 mg at 10/17/23 2216   sertraline (ZOLOFT) tablet 25 mg, 25 mg, Oral, Daily, Alford Highland, MD, 25 mg at 10/18/23 9528   traMADol (ULTRAM) tablet 50 mg, 50 mg, Oral, Q6H PRN, Arnetha Courser, MD, 50 mg at 10/18/23 1129    ALLERGIES   Penicillins, Aspirin, and Sulfa antibiotics     REVIEW OF SYSTEMS    Review of Systems:  Gen:  Denies  fever, sweats, chills weigh loss  HEENT: Denies blurred vision, double vision, ear pain, eye pain, hearing loss, nose bleeds, sore throat Cardiac:  No dizziness, chest pain or heaviness, chest tightness,edema Resp:   reports dyspnea chronically  Gi: Denies swallowing difficulty, stomach pain, nausea or vomiting, diarrhea, constipation, bowel incontinence Gu:  Denies bladder incontinence, burning urine Ext:   Denies Joint pain, stiffness or swelling Skin: Denies  skin rash, easy bruising or bleeding or hives Endoc:  Denies polyuria, polydipsia , polyphagia or weight change Psych:   Denies depression, insomnia or hallucinations   Other:  All other systems negative   VS: BP (!) 122/54 (BP Location: Left Arm)   Pulse 82   Temp 98.6 F (37 C)   Resp 16   Ht 5' 0.98" (1.549 m)   Wt 74.1 kg   LMP 12/24/1998 Comment: prior to her hysterectomy  SpO2 99%   BMI 30.88 kg/m      PHYSICAL EXAM    GENERAL:NAD, no fevers, chills, no weakness no fatigue HEAD: Normocephalic, atraumatic.  EYES: Pupils equal, round, reactive to light. Extraocular muscles intact. No scleral icterus.  MOUTH: Moist mucosal membrane. Dentition intact. No abscess noted.  EAR, NOSE, THROAT: Clear without exudates. No external lesions.  NECK: Supple. No thyromegaly. No nodules. No JVD.  PULMONARY: decreased breath sounds with mild rhonchi worse at bases bilaterally.  CARDIOVASCULAR: S1 and S2. Regular rate and rhythm. No murmurs, rubs, or gallops. No edema. Pedal pulses 2+  bilaterally.  GASTROINTESTINAL: Soft, nontender, nondistended. No masses. Positive bowel sounds. No hepatosplenomegaly.  MUSCULOSKELETAL: No swelling, clubbing, or edema. Range of motion full in all extremities.  NEUROLOGIC: Cranial nerves II through XII are intact. No gross focal neurological deficits. Appears tired with excessive sleepiness  SKIN: No ulceration, lesions, rashes, or cyanosis. Skin warm and dry. Turgor intact.  PSYCHIATRIC: Mood, affect within normal limits. The patient is awake, alert and oriented x 3. Insight, judgment intact.       IMAGING   CT chest -  Enlarging spiculated nodule in the inferior right upper lobe now 1.6 cm compared to 1.3 cm previously.   ASSESSMENT/PLAN   Lung nodule suspicious for primary bronchogenic  carcinoma   1.5cm - RUL   -patient would require biopsy - will order bronchoscopy     -Reviewed risks/complications and benefits with patient, risks include infection, pneumothorax/pneumomediastinum which may require chest tube placement as well as overnight/prolonged hospitalization and possible mechanical ventilation. Other risks include bleeding and very rarely death.  Patient understands risks and wishes to proceed.  Additional questions were answered, and patient is aware that post procedure patient will be going home with family and may experience cough with possible clots on expectoration as well as phlegm which may last few days as well as hoarseness of voice post intubation and mechanical ventilation.      Encephalopathy- RESOLVED  S/p neuro and med/onc eval  - ordered ammonia, uric acid, ABG today -reviewed    COPD with bronchitic phenotype   - patient reports tightness in chest today   - she reports panic attack when resp status deteriorates.  She is on 3L/min O2 at this time.    -She uses albuterol and Breo at home   Thank you for allowing me to participate in the care of this patient.   Patient/Family are satisfied with care  plan and all questions have been answered.    Provider disclosure: Patient with at least one acute or chronic illness or injury that poses a threat to life or bodily function and is being managed actively during this encounter.  All of the below services have been performed independently by signing provider:  review of prior documentation from internal and or external health records.  Review of previous and current lab results.  Interview and comprehensive assessment during patient visit today. Review of current and previous chest radiographs/CT scans. Discussion of management and test interpretation with health care team and patient/family.   This document was prepared using Dragon voice recognition software and may include unintentional dictation errors.     Vida Rigger, M.D.  Division of Pulmonary & Critical Care Medicine

## 2023-10-18 NOTE — Plan of Care (Signed)
  Problem: Education: Goal: Ability to describe self-care measures that may prevent or decrease complications (Diabetes Survival Skills Education) will improve Outcome: Progressing Goal: Individualized Educational Video(s) Outcome: Progressing   Problem: Coping: Goal: Ability to adjust to condition or change in health will improve Outcome: Progressing   Problem: Fluid Volume: Goal: Ability to maintain a balanced intake and output will improve Outcome: Progressing   Problem: Health Behavior/Discharge Planning: Goal: Ability to identify and utilize available resources and services will improve Outcome: Progressing Goal: Ability to manage health-related needs will improve Outcome: Progressing   Problem: Metabolic: Goal: Ability to maintain appropriate glucose levels will improve Outcome: Progressing   Problem: Nutritional: Goal: Maintenance of adequate nutrition will improve Outcome: Progressing Goal: Progress toward achieving an optimal weight will improve Outcome: Progressing   Problem: Skin Integrity: Goal: Risk for impaired skin integrity will decrease Outcome: Progressing   Problem: Tissue Perfusion: Goal: Adequacy of tissue perfusion will improve Outcome: Progressing   Problem: Education: Goal: Knowledge of General Education information will improve Description: Including pain rating scale, medication(s)/side effects and non-pharmacologic comfort measures Outcome: Progressing   Problem: Health Behavior/Discharge Planning: Goal: Ability to manage health-related needs will improve Outcome: Progressing   Problem: Clinical Measurements: Goal: Ability to maintain clinical measurements within normal limits will improve Outcome: Progressing Goal: Will remain free from infection Outcome: Progressing Goal: Diagnostic test results will improve Outcome: Progressing Goal: Respiratory complications will improve Outcome: Progressing Goal: Cardiovascular complication will  be avoided Outcome: Progressing   Problem: Activity: Goal: Risk for activity intolerance will decrease Outcome: Progressing   Problem: Nutrition: Goal: Adequate nutrition will be maintained Outcome: Progressing   Problem: Coping: Goal: Level of anxiety will decrease Outcome: Progressing   Problem: Elimination: Goal: Will not experience complications related to bowel motility Outcome: Progressing Goal: Will not experience complications related to urinary retention Outcome: Progressing   Problem: Pain Management: Goal: General experience of comfort will improve Outcome: Progressing   Problem: Safety: Goal: Ability to remain free from injury will improve Outcome: Progressing   Problem: Skin Integrity: Goal: Risk for impaired skin integrity will decrease Outcome: Progressing   Problem: Education: Goal: Knowledge of disease or condition will improve Outcome: Progressing Goal: Knowledge of the prescribed therapeutic regimen will improve Outcome: Progressing Goal: Individualized Educational Video(s) Outcome: Progressing   Problem: Activity: Goal: Ability to tolerate increased activity will improve Outcome: Progressing Goal: Will verbalize the importance of balancing activity with adequate rest periods Outcome: Progressing   Problem: Respiratory: Goal: Ability to maintain a clear airway will improve Outcome: Progressing Goal: Levels of oxygenation will improve Outcome: Progressing Goal: Ability to maintain adequate ventilation will improve Outcome: Progressing   Problem: Activity: Goal: Ability to tolerate increased activity will improve Outcome: Progressing   Problem: Clinical Measurements: Goal: Ability to maintain a body temperature in the normal range will improve Outcome: Progressing   Problem: Respiratory: Goal: Ability to maintain adequate ventilation will improve Outcome: Progressing Goal: Ability to maintain a clear airway will improve Outcome:  Progressing   Problem: Education: Goal: Knowledge of disease or condition will improve Outcome: Progressing Goal: Knowledge of secondary prevention will improve (MUST DOCUMENT ALL) Outcome: Progressing Goal: Knowledge of patient specific risk factors will improve Loraine Leriche N/A or DELETE if not current risk factor) Outcome: Progressing

## 2023-10-18 NOTE — Plan of Care (Deleted)
Problem: Education: Goal: Ability to describe self-care measures that may prevent or decrease complications (Diabetes Survival Skills Education) will improve 10/18/2023 0319 by Dennie Maizes, LPN Outcome: Progressing 10/18/2023 0318 by Dennie Maizes, LPN Outcome: Progressing Goal: Individualized Educational Video(s) 10/18/2023 0319 by Dennie Maizes, LPN Outcome: Progressing 10/18/2023 0318 by Dennie Maizes, LPN Outcome: Progressing   Problem: Coping: Goal: Ability to adjust to condition or change in health will improve 10/18/2023 0319 by Dennie Maizes, LPN Outcome: Progressing 10/18/2023 0318 by Dennie Maizes, LPN Outcome: Progressing   Problem: Fluid Volume: Goal: Ability to maintain a balanced intake and output will improve 10/18/2023 0319 by Dennie Maizes, LPN Outcome: Progressing 10/18/2023 0318 by Dennie Maizes, LPN Outcome: Progressing   Problem: Health Behavior/Discharge Planning: Goal: Ability to identify and utilize available resources and services will improve 10/18/2023 0319 by Dennie Maizes, LPN Outcome: Progressing 10/18/2023 0318 by Dennie Maizes, LPN Outcome: Progressing Goal: Ability to manage health-related needs will improve 10/18/2023 0319 by Dennie Maizes, LPN Outcome: Progressing 10/18/2023 0318 by Dennie Maizes, LPN Outcome: Progressing   Problem: Metabolic: Goal: Ability to maintain appropriate glucose levels will improve 10/18/2023 0319 by Dennie Maizes, LPN Outcome: Progressing 10/18/2023 0318 by Dennie Maizes, LPN Outcome: Progressing   Problem: Nutritional: Goal: Maintenance of adequate nutrition will improve 10/18/2023 0319 by Dennie Maizes, LPN Outcome: Progressing 10/18/2023 0318 by Dennie Maizes, LPN Outcome: Progressing Goal: Progress toward achieving an optimal weight will improve 10/18/2023 0319 by Dennie Maizes, LPN Outcome:  Progressing 10/18/2023 0318 by Dennie Maizes, LPN Outcome: Progressing   Problem: Skin Integrity: Goal: Risk for impaired skin integrity will decrease 10/18/2023 0319 by Dennie Maizes, LPN Outcome: Progressing 10/18/2023 0318 by Dennie Maizes, LPN Outcome: Progressing   Problem: Tissue Perfusion: Goal: Adequacy of tissue perfusion will improve 10/18/2023 0319 by Dennie Maizes, LPN Outcome: Progressing 10/18/2023 0318 by Dennie Maizes, LPN Outcome: Progressing   Problem: Education: Goal: Knowledge of General Education information will improve Description: Including pain rating scale, medication(s)/side effects and non-pharmacologic comfort measures 10/18/2023 0319 by Dennie Maizes, LPN Outcome: Progressing 10/18/2023 0318 by Dennie Maizes, LPN Outcome: Progressing   Problem: Health Behavior/Discharge Planning: Goal: Ability to manage health-related needs will improve 10/18/2023 0319 by Dennie Maizes, LPN Outcome: Progressing 10/18/2023 0318 by Dennie Maizes, LPN Outcome: Progressing   Problem: Clinical Measurements: Goal: Ability to maintain clinical measurements within normal limits will improve 10/18/2023 0319 by Dennie Maizes, LPN Outcome: Progressing 10/18/2023 0318 by Dennie Maizes, LPN Outcome: Progressing Goal: Will remain free from infection 10/18/2023 0319 by Dennie Maizes, LPN Outcome: Progressing 10/18/2023 0318 by Dennie Maizes, LPN Outcome: Progressing Goal: Diagnostic test results will improve 10/18/2023 0319 by Dennie Maizes, LPN Outcome: Progressing 10/18/2023 0318 by Dennie Maizes, LPN Outcome: Progressing Goal: Respiratory complications will improve 10/18/2023 0319 by Dennie Maizes, LPN Outcome: Progressing 10/18/2023 0318 by Dennie Maizes, LPN Outcome: Progressing Goal: Cardiovascular complication will be avoided 10/18/2023 0319 by Dennie Maizes, LPN Outcome: Progressing 10/18/2023 0318 by Dennie Maizes, LPN Outcome: Progressing   Problem: Activity: Goal: Risk for activity intolerance will decrease 10/18/2023 0319 by Dennie Maizes, LPN Outcome: Progressing 10/18/2023 0318 by Dennie Maizes, LPN Outcome: Progressing   Problem: Nutrition: Goal: Adequate nutrition will be maintained 10/18/2023 0319 by Dennie Maizes, LPN Outcome: Progressing 10/18/2023 0318 by Dennie Maizes, LPN Outcome: Progressing  Problem: Coping: Goal: Level of anxiety will decrease 10/18/2023 0319 by Dennie Maizes, LPN Outcome: Progressing 10/18/2023 0318 by Dennie Maizes, LPN Outcome: Progressing   Problem: Elimination: Goal: Will not experience complications related to bowel motility 10/18/2023 0319 by Dennie Maizes, LPN Outcome: Progressing 10/18/2023 0318 by Dennie Maizes, LPN Outcome: Progressing Goal: Will not experience complications related to urinary retention 10/18/2023 0319 by Dennie Maizes, LPN Outcome: Progressing 10/18/2023 0318 by Dennie Maizes, LPN Outcome: Progressing   Problem: Pain Management: Goal: General experience of comfort will improve 10/18/2023 0319 by Dennie Maizes, LPN Outcome: Progressing 10/18/2023 0318 by Dennie Maizes, LPN Outcome: Progressing   Problem: Safety: Goal: Ability to remain free from injury will improve 10/18/2023 0319 by Dennie Maizes, LPN Outcome: Progressing 10/18/2023 0318 by Dennie Maizes, LPN Outcome: Progressing   Problem: Skin Integrity: Goal: Risk for impaired skin integrity will decrease 10/18/2023 0319 by Dennie Maizes, LPN Outcome: Progressing 10/18/2023 0318 by Dennie Maizes, LPN Outcome: Progressing   Problem: Education: Goal: Knowledge of disease or condition will improve 10/18/2023 0319 by Dennie Maizes, LPN Outcome: Progressing 10/18/2023 0318 by  Dennie Maizes, LPN Outcome: Progressing Goal: Knowledge of the prescribed therapeutic regimen will improve 10/18/2023 0319 by Dennie Maizes, LPN Outcome: Progressing 10/18/2023 0318 by Dennie Maizes, LPN Outcome: Progressing Goal: Individualized Educational Video(s) 10/18/2023 0319 by Dennie Maizes, LPN Outcome: Progressing 10/18/2023 0318 by Dennie Maizes, LPN Outcome: Progressing   Problem: Activity: Goal: Ability to tolerate increased activity will improve 10/18/2023 0319 by Dennie Maizes, LPN Outcome: Progressing 10/18/2023 0318 by Dennie Maizes, LPN Outcome: Progressing Goal: Will verbalize the importance of balancing activity with adequate rest periods 10/18/2023 0319 by Dennie Maizes, LPN Outcome: Progressing 10/18/2023 0318 by Dennie Maizes, LPN Outcome: Progressing   Problem: Respiratory: Goal: Ability to maintain a clear airway will improve 10/18/2023 0319 by Dennie Maizes, LPN Outcome: Progressing 10/18/2023 0318 by Dennie Maizes, LPN Outcome: Progressing Goal: Levels of oxygenation will improve 10/18/2023 0319 by Dennie Maizes, LPN Outcome: Progressing 10/18/2023 0318 by Dennie Maizes, LPN Outcome: Progressing Goal: Ability to maintain adequate ventilation will improve 10/18/2023 0319 by Dennie Maizes, LPN Outcome: Progressing 10/18/2023 0318 by Dennie Maizes, LPN Outcome: Progressing   Problem: Activity: Goal: Ability to tolerate increased activity will improve 10/18/2023 0319 by Dennie Maizes, LPN Outcome: Progressing 10/18/2023 0318 by Dennie Maizes, LPN Outcome: Progressing   Problem: Clinical Measurements: Goal: Ability to maintain a body temperature in the normal range will improve 10/18/2023 0319 by Dennie Maizes, LPN Outcome: Progressing 10/18/2023 0318 by Dennie Maizes, LPN Outcome: Progressing   Problem: Respiratory: Goal:  Ability to maintain adequate ventilation will improve 10/18/2023 0319 by Dennie Maizes, LPN Outcome: Progressing 10/18/2023 0318 by Dennie Maizes, LPN Outcome: Progressing Goal: Ability to maintain a clear airway will improve 10/18/2023 0319 by Dennie Maizes, LPN Outcome: Progressing 10/18/2023 0318 by Dennie Maizes, LPN Outcome: Progressing   Problem: Education: Goal: Knowledge of disease or condition will improve 10/18/2023 0319 by Dennie Maizes, LPN Outcome: Progressing 10/18/2023 0318 by Dennie Maizes, LPN Outcome: Progressing Goal: Knowledge of secondary prevention will improve (MUST DOCUMENT ALL) 10/18/2023 0319 by Dennie Maizes, LPN Outcome: Progressing 10/18/2023 0318 by Dennie Maizes, LPN Outcome: Progressing Goal: Knowledge of patient specific risk factors will improve Loraine Leriche N/A or DELETE if not current risk factor) 10/18/2023 0319 by  Dennie Maizes, LPN Outcome: Progressing 10/18/2023 0318 by Dennie Maizes, LPN Outcome: Progressing

## 2023-10-18 NOTE — Progress Notes (Signed)
Physical Therapy Treatment Patient Details Name: Michele House MRN: [id number] DOB: [birth year] Today's Date: 10/18/2023   History of Present Illness Pt is a 67 y/o F admitted on 10/02/23 after presenting with acute on chronic respiratory failure with hypoxia, COPD exacerbation, PNA, UTI, & encephalopathy.  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 Mild associated  petechial hemorrhage".  PMH: COPD, chronic respiratory failure on 2L,HTN, DM2, HFpEF.    PT Comments  Pt resting in bed upon PT arrival; agreeable to therapy.  Pt able to move R fingers a little (very gentle R hand squeeze noted); trace R elbow extension contraction noted; trace R elbow flexion contraction noted; trace R LE hamstring contraction noted; no other active motion noted R UE/R LE.  During session pt max assist x2 with bed mobility and max assist with sitting balance (posterior R lean noted); pt only able to sit on EOB for a few minutes d/t reporting dizziness/lightheadedness and feeling sick.  Pt assisted back to bed and pt reporting feeling better; after setting pt up in bed pt then reporting feeling sick to her stomach again (NT reported pt also reporting this earlier today)--nurse notified immediately and reported she would come assess pt (NT present with pt end of PT session).  Will continue to focus on strengthening, balance, and progressive functional mobility per pt tolerance.   If plan is discharge home, recommend the following: Assistance with cooking/housework;Supervision due to cognitive status;Direct supervision/assist for financial management;Help with stairs or ramp for entrance;Assist for transportation;Direct supervision/assist for medications management;Assistance with feeding;Two people to help with walking and/or transfers;A lot of help with bathing/dressing/bathroom   Can travel by private vehicle       No  Equipment Recommendations  BSC/3in1;Wheelchair (measurements PT);Wheelchair cushion (measurements PT);Hoyer lift;Hospital bed    Recommendations for Other Services       Precautions / Restrictions Precautions Precautions: Fall Restrictions Weight Bearing Restrictions Per Provider Order: No     Mobility  Bed Mobility Overal bed mobility: Needs Assistance       Supine to sit: Max assist, +2 for physical assistance Sit to supine: Max assist, +2 for physical assistance   General bed mobility comments: assist for trunk and B LE's; vc's for technique    Transfers                   General transfer comment: Deferred d/t assist required for sitting balance    Ambulation/Gait                   Stairs             Wheelchair Mobility     Tilt Bed    Modified Rankin (Stroke Patients Only)       Balance Overall balance assessment: Needs assistance Sitting-balance support: Single extremity supported, Feet unsupported Sitting balance-Leahy Scale: Zero Sitting balance - Comments: Max assist for static sitting at EOB (tending to lean to R/backwards); 2nd assist for safety; cues to utilize L UE on bed for balance                                    Cognition Arousal: Alert Behavior During Therapy: Flat affect Overall Cognitive Status: No family/caregiver present to determine baseline cognitive functioning  General Comments: Oriented to self, hospital, and general situation/diagnosis.        Exercises      General Comments  Nursing cleared pt for participation in physical therapy.  Pt agreeable to PT session.      Pertinent Vitals/Pain Pain Assessment Pain Assessment: 0-10 Pain Score: 5  Pain Location: posterior B calves Pain Descriptors / Indicators: Pins and needles Pain Intervention(s): Limited activity within patient's tolerance, Monitored during session, Repositioned,  Patient requesting pain meds-RN notified HR 83-93 bpm and SpO2 sats 94% or greater during treatment session.    Home Living                          Prior Function            PT Goals (current goals can now be found in the care plan section) Acute Rehab PT Goals Patient Stated Goal: none stated PT Goal Formulation: With patient Time For Goal Achievement: 11/01/23 Potential to Achieve Goals: Poor Progress towards PT goals: Progressing toward goals    Frequency    Min 1X/week      PT Plan      Co-evaluation              AM-PAC PT "6 Clicks" Mobility   Outcome Measure  Help needed turning from your back to your side while in a flat bed without using bedrails?: A Lot Help needed moving from lying on your back to sitting on the side of a flat bed without using bedrails?: Total Help needed moving to and from a bed to a chair (including a wheelchair)?: Total Help needed standing up from a chair using your arms (e.g., wheelchair or bedside chair)?: Total Help needed to walk in hospital room?: Total Help needed climbing 3-5 steps with a railing? : Total 6 Click Score: 7    End of Session   Activity Tolerance: Other (comment) (Limited d/t pt not feeling well (nurse notified)) Patient left: in bed;with call bell/phone within reach;with bed alarm set;with nursing/sitter in room;Other (comment) (NT present; B heels floating via pillow support; R UE supported via pillow) Nurse Communication: Mobility status;Precautions;Patient requests pain meds;Other (comment) (Pt's c/o lightheaded/dizziness in sitting; pt reporting not feeling well/feeling sick to her stomach) PT Visit Diagnosis: Unsteadiness on feet (R26.81);Other abnormalities of gait and mobility (R26.89);Muscle weakness (generalized) (M62.81);Difficulty in walking, not elsewhere classified (R26.2)     Time: 1610-9604 PT Time Calculation (min) (ACUTE ONLY): 28 min  Charges:    $Therapeutic Activity: 23-37  mins PT General Charges $$ ACUTE PT VISIT: 1 Visit                     Hendricks Limes, PT 10/18/23, 1:01 PM

## 2023-10-18 NOTE — Progress Notes (Signed)
Progress Note   Patient: Michele House WGN:[id number] DOB: 1956-10-31 DOA: 10/02/2023     16 DOS: the patient was seen and examined on 10/18/2023   Brief hospital course: Taken from H&P.  Michele House is a 67 y.o. female with medical history significant of COPD, chronic respiratory failure on 2 L, hypertension, type 2 diabetes, HFpEF presenting with acute on chronic respiratory failure with hypoxia, COPD exacerbation, pneumonia, UTI, encephalopathy.  Patient reports generalized malaise over the past 3 to 4 days.  Patient noted to have been recently admitted in the hospital November 21 through November 24 for similar issues including COPD exacerbation.  Patient reports not taking her medication since discharge.  Presented to the ER afebrile, heart rate 100s, BP stable.  Satting 96% on 3 L nasal cannula.  White count 23.4, hemoglobin 15, platelets 401, lactate 1.6, procalcitonin less than 0.1, troponin 18, urinalysis indicative of infection.  Urine drug screen negative.  Chest x-ray with a right sided pneumonia.  BNP within normal limits.  Creatinine 1.03.  Glucose 368.EKG sinus tachycardia.  Admitted for concern of pneumonia/COPD exacerbation and UTI. Started on Rocephin and azithromycin.  12/1: Vital stable, respiratory viral panel negative, procalcitonin negative, preliminary blood cultures negative.  Urine cultures are ordered as add-on, ammonia levels mildly elevated at 39, strep pneumo negative, CBG elevated at 244, history of enlarging pulmonary nodule with recommendations for PET/CT during most recent admission which has not been done yet, if video bronchoscopy was scheduled for 10/13/2023 as outpatient.  Unable to take care of himself and son cannot provide the required care as she required 24-hour supervision due to worsening dementia.  PT is recommending SNF.  12/2: Hemodynamically stable, now on baseline oxygen of 2 L.  Needs SNF placement. Urine cultures pending.  12/3:  Blood pressure started trending up, restarting home Zestoretic.  Urine cultures with strep agalactiae, penicillin allergy noted, will continue with ceftriaxone for now.  Also started on Remeron for concern of worsening depression and unable to sleep at night  12/4.  Some very concerned about patient's mental status not being seen.  Patient receiving antibiotics already and should have improved by now.  Will hold Lipitor.  Restart low-dose Zoloft.  Discontinue Remeron and do Seroquel at night. 12/5.  Patient has some blurry vision and weakness and pain.  MRI of the brain negative for acute intracranial abnormality. 12/6.  Patient stated she slept well.  Patient able to lift up her arms up off the bed today.  Patient thought she heard her son's voice outside the room. 12/7.  Patient sitting up in chair when I saw her.  Was able to feed herself breakfast.  Felt okay.  Did not offer any complaints. 12/8.  Patient more confused today than yesterday.  Able to follow some simple commands but not as good of a conversation today as yesterday. 12/9.  Patient still confused today.  Does not follow simple commands today. 12/10.  Patient more talkative today but was looking up at the ceiling when she was talking with me.  She does not move her extremities to command but does move them on her own.  Physical therapy and Occupational Therapy did not see her move her right arm. 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  Mild associated petechial hemorrhage.  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 12/12: Patient with new stroke, CTA head and neck with no large vessel occlusion.  Patient had a recent echocardiogram which was normal. CSF cultures remain negative.  Lipid panel with increased triglyceride at 347, HDL 29 and LDL of  38.  CBC with mild thrombocytopenia at 130, BMP mostly unremarkable.  Patient with significant right-sided weakness. Concern of paraneoplastic versus hypercoagulability secondary to malignancy.  Patient missed her appointment for bronchoscopy and outpatient appointment for PET scan due to recurrent hospitalizations. Involving pulmonary and oncology for their input. Send out encephalitis panel still pending.  12/13: Patient continued to have waxing and waning mental status, unable to move right upper and lower extremities.  Having some hallucination.  Pulmonary is planning lung biopsy on 12/27 if she remains stable.  Rapidly progressive dementia and now with underlying stroke, question of paraneoplastic encephalitis and hypercoagulability secondary to underlying undiagnosed malignancy. Patient will also get benefit from neuropsych evaluation as outpatient.  12/14: Patient with some improved mentation today.  Trying to squeeze with right hand but still no movements of right lower extremity.  12/15: Patient with much improved mentation.  Having some flickering of movements on right upper and lower extremity today, left extremities weaker but seems improving.   12/16: Mental status seems stable, flaccid right upper and lower extremities.  Complaining of muscle spasms so Robaxin was ordered.  Pulmonary is trying to do bronchoscopy and biopsy on Wednesday.   Assessment and Plan: * Acute metabolic encephalopathy Much improved mentation and talking appropriately today   Repeat MRI on 12/11 with multiple acute small infarcts involving bilateral frontal and parietal territory, a small petechial hemorrhage.   Discontinued Lipitor.  Patient has elevated vitamin B12, nonreactive RPR, TSH in the normal range.  Potentially has a progressive dementia versus limbic encephalitis versus autoimmune encephalitis versus leptomeningeal spread from spiculated nodule.  Lumbar puncture done 12/6 with testing sent to the  [facility].  Patient completed 5 days of high-dose Solu-Medrol (today will be day 5).  Repeat ABG does not show CO2 retention.  Repeat EEG does not show any seizure activity.    No definitive diagnosis yet.  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  CVA (cerebral vascular accident) (HCC) Repeat MRI with bilateral stroke in watershed area. Significant bilateral weakness, right more than left. Neurology is on board. Concern of paraneoplastic versus hypercoagulable state with underlying malignancy ?? Lower extremity venous Doppler was negative for DVT so no need for TEE -Patient was started on Plavix, if she was proven to have underlying malignancy and hypercoagulable state then she will need anticoagulation instead of Plavix per neurology  Uncontrolled type 2 diabetes mellitus with hypoglycemia, without long-term current use of insulin (HCC) CBG elevated.  Patient completed 5-day course of high-dose steroid. -Increasing Semglee to 15 unit daily -Continue with SSI  Hypotension Blood pressure within goal.  Holding antihypertensive medications  Hypernatremia Resolved. -Continue to monitor  Acute on chronic respiratory failure with hypoxia (HCC) Patient initially required 3 L now on 1 L.  Lung nodule 1.3 cm right upper lobe spiculated in nature.  Could be a primary lung cancerous process.  Will need outpatient PET scan and likely bronchoscopy for diagnosis.  Spoke with Dr. Karna Christmas today and she has missed a few outpatient appointments.  COPD exacerbation (HCC) Resolved  Pneumonia Completed 5 days of Rocephin and Zithromax  UTI (urinary tract infection) Streptococcus agalactiae growing out of urine culture.  Completed Rocephin  (  HFpEF) heart failure with preserved ejection fraction (HCC) Chronic in nature.  Not in acute exacerbation.  No signs of heart failure currently.  2D echo  09/2023 showed LVEF 55 to 60%, no regional wall motion abnormalities    Essential hypertension Holding antihypertensive medications with hypotension.  Type 2 diabetes mellitus with obesity (HCC) Sugars high now that she is on high-dose Solu-Medrol.  BMI 30.88   Subjective: Patient was complaining of some leg spasms and stating that my leg is feeling numb.  Mentation remains appropriate  Physical Exam: Vitals:   10/17/23 2055 10/18/23 0020 10/18/23 0426 10/18/23 0751  BP: (!) 132/59 110/64 (!) 141/59 (!) 122/54  Pulse: 76 78 87 82  Resp: 18 18 18 16   Temp: 98.4 F (36.9 C) 98 F (36.7 C) 98.1 F (36.7 C) 98.6 F (37 C)  TempSrc: Oral Oral Oral   SpO2: 98% 99% 98% 99%  Weight:      Height:       General.  Frail elderly lady, in no acute distress. Pulmonary.  Lungs clear bilaterally, normal respiratory effort. CV.  Regular rate and rhythm, no JVD, rub or murmur. Abdomen.  Soft, nontender, nondistended, BS positive. CNS.  Alert and oriented .  Flaccid right upper and lower extremity Extremities.  No edema, no cyanosis, pulses intact and symmetrical.   Data Reviewed: Prior data reviewed  Family Communication: Talked with daughter on phone.  Disposition: Status is: Inpatient Remains inpatient appropriate because: Severity of illness  Planned Discharge Destination: Rehab  DVT prophylaxis.  Lovenox Time spent: 45 minutes  This record has been created using Conservation officer, historic buildings. Errors have been sought and corrected,but may not always be located. Such creation errors do not reflect on the standard of care.   Author: Arnetha Courser, MD 10/18/2023 2:22 PM  For on call review [email or web address].

## 2023-10-18 NOTE — Progress Notes (Signed)
Patient appears to have decreased urine output. Purewick had little output for my shift. Pt not drinking enough. I gave her 2 full cups and helped her drink it. Reported to next nurse to try to push extra fluids.

## 2023-10-18 NOTE — Progress Notes (Signed)
Occupational Therapy Treatment Patient Details Name: ALSION MALLEY MRN: [id number] DOB: 1956-10-08 Today's Date: 10/18/2023   History of present illness Pt is a 67 y/o F admitted on 10/02/23 after presenting with acute on chronic respiratory failure with hypoxia, COPD exacerbation, PNA, UTI, & encephalopathy.  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 Mild associated  petechial hemorrhage".  PMH: COPD, chronic respiratory failure on 2L,HTN, DM2, HFpEF.   OT comments  Ms Orcutt was seen for OT treatment on this date. Upon arrival to room pt in bed, agreeable to tx. Pt requires TOTAL A to reposition, pt reports dizziness earlier with PT, defers sitting attempts this date. MIN A self-feeding at bed level using nondominant LUE. Pt tolerated bed in chair position for 20 min and therex: 2 set x 10 reps each - head turns L+R, composite digit extension/flexion within partial ROM (2-/5 R grip noted), thumb adduction for lateral pinch, and shoulder shrugs with trace movement in R shoulder noted. 2-/5 plantar flexion noted in RLE. Pt making progress toward goals, will continue to follow POC. Discharge recommendation remains appropriate.        If plan is discharge home, recommend the following:  Assistance with cooking/housework;Assist for transportation;Help with stairs or ramp for entrance;Direct supervision/assist for medications management;Supervision due to cognitive status;Direct supervision/assist for financial management;Two people to help with walking and/or transfers;Two people to help with bathing/dressing/bathroom   Equipment Recommendations  Other (comment) (defer)    Recommendations for Other Services      Precautions / Restrictions Precautions Precautions: Fall Restrictions Weight Bearing Restrictions Per Provider Order: No       Mobility Bed Mobility Overal bed mobility:  Needs Assistance             General bed mobility comments: TOTAL A to reposition, pt reports dizziness earlier with PT, defers sitting attempts this date.     Transfers    Not tested                         ADL either performed or assessed with clinical judgement   ADL Overall ADL's : Needs assistance/impaired                                       General ADL Comments: MIN A self-feeding at bed level using nondominant LUE.       Cognition Arousal: Alert Behavior During Therapy: Flat affect Overall Cognitive Status: No family/caregiver present to determine baseline cognitive functioning                                 General Comments: reoriented to situation        Exercises Other Exercises Other Exercises: tolerated bed in chair position for 20 min and therex: 2 set x 10 reps each - head turns L+R, composite digit extension/flexion within partial ROM, thumb adduction for pinch, and shoulder shrugs with trace movement in R shoulder noted            Pertinent Vitals/ Pain       Pain Assessment Pain Assessment: No/denies pain   Frequency  Min 1X/week        Progress Toward Goals  OT Goals(current goals can now be found in the care plan section)  Progress towards OT goals: Progressing toward goals  Acute Rehab OT Goals OT Goal Formulation: With patient Time For Goal Achievement: 10/26/23 Potential to Achieve Goals: Good ADL Goals Pt Will Perform Lower Body Dressing: with min assist;sitting/lateral leans Pt Will Transfer to Toilet: bedside commode;with mod assist;squat pivot transfer Pt Will Perform Toileting - Clothing Manipulation and hygiene: with mod assist;sitting/lateral leans;sit to/from stand Additional ADL Goal #1: Pt will demonstrate good safety awareness during ADL/mobility tasks requiring no VC for safety, 3/3 opportunities.  Plan      Co-evaluation                 AM-PAC OT "6 Clicks"  Daily Activity     Outcome Measure   Help from another person eating meals?: A Little Help from another person taking care of personal grooming?: A Lot Help from another person toileting, which includes using toliet, bedpan, or urinal?: Total Help from another person bathing (including washing, rinsing, drying)?: Total Help from another person to put on and taking off regular upper body clothing?: A Lot Help from another person to put on and taking off regular lower body clothing?: Total 6 Click Score: 10    End of Session Equipment Utilized During Treatment: Oxygen  OT Visit Diagnosis: Other abnormalities of gait and mobility (R26.89);Muscle weakness (generalized) (M62.81)   Activity Tolerance Patient tolerated treatment well   Patient Left in bed;with call bell/phone within reach;with bed alarm set   Nurse Communication          Time: 7829-5621 OT Time Calculation (min): 21 min  Charges: OT General Charges $OT Visit: 1 Visit OT Treatments $Neuromuscular Re-education: 8-22 mins  Kathie Dike, M.S. OTR/L  10/18/23, 3:37 PM  ascom [phone]

## 2023-10-18 NOTE — TOC Progression Note (Signed)
Transition of Care [facility]) - Progression Note    Patient Details  Name: CASILDA DANCEY MRN: [id number] Date of Birth: 31-Jan-1956  Transition of [facility]) CM/SW Contact  Allena Katz, LCSW Phone Number: 10/18/2023, 9:56 AM  Clinical Narrative:   Pt still confused. CSW spoke with daughter who states that she is wanting pt to go to either [facility] or [facility]. She states she does not have a preference. CSW needs new PT notes to start auth.    Expected Discharge Plan: Skilled Nursing Facility Barriers to Discharge: Continued Medical Work up  Expected Discharge Plan and Services                                               Social Determinants of Health (SDOH) Interventions SDOH Screenings   Food Insecurity: Patient Unable To Answer (10/03/2023)  Recent Concern: Food Insecurity - Food Insecurity Present (09/24/2023)  Housing: High Risk (10/03/2023)  Transportation Needs: Patient Unable To Answer (10/03/2023)  Recent Concern: Transportation Needs - Unmet Transportation Needs (09/24/2023)  Utilities: Patient Unable To Answer (10/03/2023)  Tobacco Use: Medium Risk (10/02/2023)    Readmission Risk Interventions     No data to display

## 2023-10-18 NOTE — Progress Notes (Signed)
Speech Language Pathology Treatment: Cognitive-Linquistic  Patient Details Name: Michele House MRN: [id number] DOB: [birth year] Today's Date: 10/18/2023 Time: 4098-1191 SLP Time Calculation (min) (ACUTE ONLY): 15 min  Assessment / Plan / Recommendation Clinical Impression  Pt seen at bedside for further assessment of cognitive linguistic function. Pt was awake and alert, pleasant and cooperative. The Mini-Mental State Examination (MMSE) was administered today. Pt scored 22/30, raising concern for cognitive deficits. Pt exhibited difficulty with orientation to time, attention, delayed recall and writing tasks. She demonstrated immediate recall of 3/3 unrelated words, named objects, repeated a phrase, followed 3-step commands, and followed written direction. Recommend continued ST intervention targeting cognition for improved orientation and functional recall to maximize independence and minimize caregiver burden.    HPI HPI: Pt is a 67 y/o F admitted on 10/02/23 after presenting with acute on chronic respiratory failure with hypoxia, COPD exacerbation, PNA, UTI, & encephalopathy. PMH: COPD, chronic respiratory failure on 2L,HTN, DM2, HFpEF. Head CT/CTA, 12/11, "CT HEAD:     1. 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 No  associated hemorrhage evident by CT. No significant regional mass  effect.  2. No other new acute intracranial abnormality.  3. 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 No hydrocephalus.  4. Age-related cerebral atrophy with mild chronic microvascular  ischemic disease.     CTA HEAD AND NECK:     1. Negative CTA for large vessel occlusion or other emergent  finding.  2. 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  3. 1.7 cm spiculated right upper lobe pulmonary nodule. Again,  further  evaluation with dedicated PET-CT recommended."      SLP Plan  Continue with current plan of care      Recommendations for follow up therapy are one component of a multi-disciplinary discharge planning process, led by the attending physician.  Recommendations may be updated based on patient status, additional functional criteria and insurance authorization.    Recommendations   24 hour supervision at DC                      Frequent or constant Supervision/Assistance Cognitive communication deficit (R41.841)     Continue with current plan of care    Avaleen Brownley B. Murvin Natal, [facility], CCC-SLP Speech Language Pathologist  [facility] 10/18/2023, 12:23 PM

## 2023-10-18 NOTE — Inpatient Diabetes Management (Signed)
Inpatient Diabetes Program Recommendations  AACE/ADA: New Consensus Statement on Inpatient Glycemic Control  Target Ranges:  Prepandial:   less than 140 mg/dL      [facility] postprandial:   less than 180 mg/dL (1-2 hours)      Critically ill patients:  140 - 180 mg/dL    Latest Reference Range & Units 10/17/23 07:59 10/17/23 11:44 10/17/23 16:41 10/17/23 20:57 10/18/23 00:17 10/18/23 01:31 10/18/23 04:28  Glucose-Capillary 70 - 99 mg/dL 161 (H) 096 (H) 045 (H) 98 59 (L) 179 (H) 140 (H)   Review of Glycemic Control  Diabetes history: DM2 Outpatient Diabetes medications: Glipizide 10 mg QAM, Tradjenta 5 mg daily Current orders for Inpatient glycemic control: Semglee 15 units at bedtime, Novolog 0-15 units TID with meals, Novolog 0-5 units QHS  Inpatient Diabetes Program Recommendations:    Insulin: CBG 59 mg/dl at 40:98 today.  May want to consider decreasing Semglee to 13 units at bedtime.  Thanks, Orlando Penner, RN, MSN, CDCES Diabetes Coordinator Inpatient Diabetes Program [phone] (Team Pager from 8am to 5pm)

## 2023-10-19 ENCOUNTER — Inpatient Hospital Stay: Payer: 59

## 2023-10-19 DIAGNOSIS — G9341 Metabolic encephalopathy: Secondary | ICD-10-CM | POA: Diagnosis not present

## 2023-10-19 LAB — CBC WITH DIFFERENTIAL/PLATELET
Abs Immature Granulocytes: 0.22 K/uL — ABNORMAL HIGH (ref 0.00–0.07)
Basophils Absolute: 0 K/uL (ref 0.0–0.1)
Basophils Relative: 0 %
Eosinophils Absolute: 0 K/uL (ref 0.0–0.5)
Eosinophils Relative: 0 %
HCT: 29.5 % — ABNORMAL LOW (ref 36.0–46.0)
Hemoglobin: 9.6 g/dL — ABNORMAL LOW (ref 12.0–15.0)
Immature Granulocytes: 3 %
Lymphocytes Relative: 10 %
Lymphs Abs: 0.7 K/uL (ref 0.7–4.0)
MCH: 29.5 pg (ref 26.0–34.0)
MCHC: 32.5 g/dL (ref 30.0–36.0)
MCV: 90.8 fL (ref 80.0–100.0)
Monocytes Absolute: 0.6 K/uL (ref 0.1–1.0)
Monocytes Relative: 9 %
Neutro Abs: 5.3 K/uL (ref 1.7–7.7)
Neutrophils Relative %: 78 %
Platelets: 188 K/uL (ref 150–400)
RBC: 3.25 MIL/uL — ABNORMAL LOW (ref 3.87–5.11)
RDW: 14 % (ref 11.5–15.5)
WBC: 6.7 K/uL (ref 4.0–10.5)
nRBC: 0 % (ref 0.0–0.2)

## 2023-10-19 LAB — COMPREHENSIVE METABOLIC PANEL WITH GFR
ALT: 18 U/L (ref 0–44)
AST: 18 U/L (ref 15–41)
Albumin: 3.1 g/dL — ABNORMAL LOW (ref 3.5–5.0)
Alkaline Phosphatase: 70 U/L (ref 38–126)
Anion gap: 9 (ref 5–15)
BUN: 13 mg/dL (ref 8–23)
CO2: 33 mmol/L — ABNORMAL HIGH (ref 22–32)
Calcium: 8.9 mg/dL (ref 8.9–10.3)
Chloride: 93 mmol/L — ABNORMAL LOW (ref 98–111)
Creatinine, Ser: 0.81 mg/dL (ref 0.44–1.00)
GFR, Estimated: >60 mL/min
Glucose, Bld: 201 mg/dL — ABNORMAL HIGH (ref 70–99)
Potassium: 4.5 mmol/L (ref 3.5–5.1)
Sodium: 135 mmol/L (ref 135–145)
Total Bilirubin: <0.2 mg/dL
Total Protein: 5.4 g/dL — ABNORMAL LOW (ref 6.5–8.1)

## 2023-10-19 LAB — GLUCOSE, CAPILLARY
Glucose-Capillary: 139 mg/dL — ABNORMAL HIGH (ref 70–99)
Glucose-Capillary: 153 mg/dL — ABNORMAL HIGH (ref 70–99)
Glucose-Capillary: 187 mg/dL — ABNORMAL HIGH (ref 70–99)
Glucose-Capillary: 216 mg/dL — ABNORMAL HIGH (ref 70–99)
Glucose-Capillary: 246 mg/dL — ABNORMAL HIGH (ref 70–99)

## 2023-10-19 MED ORDER — FUROSEMIDE 10 MG/ML IJ SOLN
40.0000 mg | Freq: Every day | INTRAMUSCULAR | Status: DC
  Administered 2023-10-19 – 2023-10-23 (×4): 40 mg via INTRAVENOUS
  Filled 2023-10-19 (×4): qty 4

## 2023-10-19 MED ORDER — ONDANSETRON HCL 4 MG/2ML IJ SOLN
4.0000 mg | Freq: Four times a day (QID) | INTRAMUSCULAR | Status: DC | PRN
  Administered 2023-10-19 – 2023-10-24 (×4): 4 mg via INTRAVENOUS
  Filled 2023-10-19 (×4): qty 2

## 2023-10-19 NOTE — Plan of Care (Signed)
Problem: Education: Goal: Ability to describe self-care measures that may prevent or decrease complications (Diabetes Survival Skills Education) will improve Outcome: Progressing Goal: Individualized Educational Video(s) Outcome: Progressing   Problem: Coping: Goal: Ability to adjust to condition or change in health will improve Outcome: Progressing   Problem: Fluid Volume: Goal: Ability to maintain a balanced intake and output will improve Outcome: Progressing   Problem: Health Behavior/Discharge Planning: Goal: Ability to identify and utilize available resources and services will improve Outcome: Progressing Goal: Ability to manage health-related needs will improve Outcome: Progressing   Problem: Metabolic: Goal: Ability to maintain appropriate glucose levels will improve Outcome: Progressing   Problem: Nutritional: Goal: Maintenance of adequate nutrition will improve Outcome: Progressing Goal: Progress toward achieving an optimal weight will improve Outcome: Progressing   Problem: Skin Integrity: Goal: Risk for impaired skin integrity will decrease Outcome: Progressing   Problem: Tissue Perfusion: Goal: Adequacy of tissue perfusion will improve Outcome: Progressing   Problem: Education: Goal: Knowledge of General Education information will improve Description: Including pain rating scale, medication(s)/side effects and non-pharmacologic comfort measures Outcome: Progressing   Problem: Health Behavior/Discharge Planning: Goal: Ability to manage health-related needs will improve Outcome: Progressing   Problem: Clinical Measurements: Goal: Ability to maintain clinical measurements within normal limits will improve Outcome: Progressing Goal: Will remain free from infection Outcome: Progressing Goal: Diagnostic test results will improve Outcome: Progressing Goal: Respiratory complications will improve Outcome: Progressing Goal: Cardiovascular complication will  be avoided Outcome: Progressing   Problem: Activity: Goal: Risk for activity intolerance will decrease Outcome: Progressing   Problem: Nutrition: Goal: Adequate nutrition will be maintained Outcome: Progressing   Problem: Coping: Goal: Level of anxiety will decrease Outcome: Progressing   Problem: Elimination: Goal: Will not experience complications related to bowel motility Outcome: Progressing Goal: Will not experience complications related to urinary retention Outcome: Progressing   Problem: Pain Management: Goal: General experience of comfort will improve Outcome: Progressing   Problem: Safety: Goal: Ability to remain free from injury will improve Outcome: Progressing   Problem: Skin Integrity: Goal: Risk for impaired skin integrity will decrease Outcome: Progressing   Problem: Education: Goal: Knowledge of disease or condition will improve Outcome: Progressing Goal: Knowledge of the prescribed therapeutic regimen will improve Outcome: Progressing Goal: Individualized Educational Video(s) Outcome: Progressing   Problem: Activity: Goal: Ability to tolerate increased activity will improve Outcome: Progressing Goal: Will verbalize the importance of balancing activity with adequate rest periods Outcome: Progressing   Problem: Respiratory: Goal: Ability to maintain a clear airway will improve Outcome: Progressing Goal: Levels of oxygenation will improve Outcome: Progressing Goal: Ability to maintain adequate ventilation will improve Outcome: Progressing   Problem: Activity: Goal: Ability to tolerate increased activity will improve Outcome: Progressing   Problem: Clinical Measurements: Goal: Ability to maintain a body temperature in the normal range will improve Outcome: Progressing   Problem: Respiratory: Goal: Ability to maintain adequate ventilation will improve Outcome: Progressing Goal: Ability to maintain a clear airway will improve Outcome:  Progressing   Problem: Education: Goal: Knowledge of disease or condition will improve Outcome: Progressing Goal: Knowledge of secondary prevention will improve (MUST DOCUMENT ALL) Outcome: Progressing Goal: Knowledge of patient specific risk factors will improve Loraine Leriche N/A or DELETE if not current risk factor) Outcome: Progressing   Problem: Ischemic Stroke/TIA Tissue Perfusion: Goal: Complications of ischemic stroke/TIA will be minimized Outcome: Progressing   Problem: Coping: Goal: Will verbalize positive feelings about self Outcome: Progressing Goal: Will identify appropriate support needs Outcome: Progressing   Problem: Health  Behavior/Discharge Planning: Goal: Ability to manage health-related needs will improve Outcome: Progressing Goal: Goals will be collaboratively established with patient/family Outcome: Progressing   Problem: Self-Care: Goal: Ability to participate in self-care as condition permits will improve Outcome: Progressing Goal: Verbalization of feelings and concerns over difficulty with self-care will improve Outcome: Progressing Goal: Ability to communicate needs accurately will improve Outcome: Progressing

## 2023-10-19 NOTE — Progress Notes (Signed)
Progress Note    Michele House  ZOX:[id number] DOB: 1956-05-23  DOA: 10/02/2023 PCP: [facility]      Brief Narrative:    Medical records reviewed and are as summarized below:  Michele House is a 67 y.o. female with medical history significant of COPD, chronic respiratory failure on 2 L, hypertension, type 2 diabetes, HFpEF, recent discharge from the hospital 24th November 2024 after hospitalization for COPD exacerbation complicated by respiratory failure.  She presented to the hospital again on 10/02/2023 with chest pain, malaise, cough, shortness of breath, abdominal pain and dysuria.  She was admitted to the hospital for COPD exacerbation, acute on chronic hypoxemic respiratory failure, UTI and pneumonia.  She became more confused in the hospital and MRI brain revealed acute stroke.        12/1: Vital stable, respiratory viral panel negative, procalcitonin negative, preliminary blood cultures negative.  Urine cultures are ordered as add-on, ammonia levels mildly elevated at 39, strep pneumo negative, CBG elevated at 244, history of enlarging pulmonary nodule with recommendations for PET/CT during most recent admission which has not been done yet, if video bronchoscopy was scheduled for 10/13/2023 as outpatient.  Unable to take care of himself and son cannot provide the required care as she required 24-hour supervision due to worsening dementia.  PT is recommending SNF.  12/2: Hemodynamically stable, now on baseline oxygen of 2 L.  Needs SNF placement. Urine cultures pending.  12/3: Blood pressure started trending up, restarting home Zestoretic.  Urine cultures with strep agalactiae, penicillin allergy noted, will continue with ceftriaxone for now.  Also started on Remeron for concern of worsening depression and unable to sleep at night  12/4.  Some very concerned about patient's mental status not being seen.  Patient receiving antibiotics already  and should have improved by now.  Will hold Lipitor.  Restart low-dose Zoloft.  Discontinue Remeron and do Seroquel at night. 12/5.  Patient has some blurry vision and weakness and pain.  MRI of the brain negative for acute intracranial abnormality. 12/6.  Patient stated she slept well.  Patient able to lift up her arms up off the bed today.  Patient thought she heard her son's voice outside the room. 12/7.  Patient sitting up in chair when I saw her.  Was able to feed herself breakfast.  Felt okay.  Did not offer any complaints. 12/8.  Patient more confused today than yesterday.  Able to follow some simple commands but not as good of a conversation today as yesterday. 12/9.  Patient still confused today.  Does not follow simple commands today. 12/10.  Patient more talkative today but was looking up at the ceiling when she was talking with me.  She does not move her extremities to command but does move them on her own.  Physical therapy and Occupational Therapy did not see her move her right arm. 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  Mild associated petechial hemorrhage.  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 12/12: Patient with new stroke, CTA head and neck with no large vessel occlusion.  Patient had a recent echocardiogram which was normal. CSF cultures remain negative.  Lipid panel with increased triglyceride at 347, HDL 29 and LDL of 38.  CBC with mild thrombocytopenia at 130, BMP mostly unremarkable.  Patient with significant right-sided weakness. Concern of paraneoplastic versus hypercoagulability secondary to malignancy.  Patient missed her appointment for bronchoscopy and outpatient appointment for PET scan due to recurrent hospitalizations. Involving pulmonary and oncology for their input. Send  out encephalitis panel still pending.  12/13: Patient continued to have waxing and waning mental status, unable to move right upper and lower extremities.  Having some hallucination.  Pulmonary is planning lung biopsy on 12/27 if she remains stable.  Rapidly progressive dementia and now with underlying stroke, question of paraneoplastic encephalitis and hypercoagulability secondary to underlying undiagnosed malignancy. Patient will also get benefit from neuropsych evaluation as outpatient.  12/14: Patient with some improved mentation today.  Trying to squeeze with right hand but still no movements of right lower extremity.  12/15: Patient with much improved mentation.  Having some flickering of movements on right upper and lower extremity today, left extremities weaker but seems improving.   12/16: Mental status seems stable, flaccid right upper and lower extremities.  Complaining of muscle spasms so Robaxin was ordered.  Pulmonary is trying to do bronchoscopy and biopsy on Wednesday.      Assessment/Plan:   Principal Problem:   Acute metabolic encephalopathy Active Problems:   CVA (cerebral vascular accident) (HCC)   Uncontrolled type 2 diabetes mellitus with hypoglycemia, without long-term current use of insulin (HCC)   Hypotension   Acute on chronic respiratory failure with hypoxia (HCC)   Hypernatremia   COPD exacerbation (HCC)   Lung nodule   Pneumonia   UTI (urinary tract infection)   (HFpEF) heart failure with preserved ejection fraction (HCC)   Essential hypertension   Type 2 diabetes mellitus with obesity (HCC)    Body mass index is 30.88 kg/m.   Acute metabolic encephalopathy: Improved. She completed 5 days of high-dose several IV Solu-Medrol on 10/12/2023 Differential diagnosis include autoimmune encephalitis, leptomeningeal spread from spiculated nodule, progressive dementia.   Acute stroke: Continue Plavix.  If she is found to have underlying malignancy and  hypercoagulable state then she may need anticoagulation instead of Plavix.  Neurologist. 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   Type II DM with hyperglycemia: Continue Semglee and NovoLog.   Hypotension: Improved   Pneumonia: Completed 5 days of IV ceftriaxone and azithromycin Acute UTI: Urine culture showed strep state and ATN.  Completed IV ceftriaxone.   COPD exacerbation: Resolved.  Continue bronchodilators Acute on chronic hypoxemic respiratory failure: Continue 2 L/min oxygen via nasal cannula   1.3 cm right upper lung spiculated nodule: Patient had missed her appointments for follow-up in the past.  Plan for inpatient bronchoscopy.  Follow-up with pulmonologist.    Diet Order             Diet NPO time specified  Diet effective midnight           DIET DYS 3 Room service appropriate? Yes with Assist; Fluid consistency: Thin  Diet effective now                            Consultants: Pulmonologist Hematologist  Procedures: Lumbar puncture on 10/08/2023    Medications:    clopidogrel  75 mg Oral Daily   enoxaparin (LOVENOX) injection  40 mg Subcutaneous Daily   furosemide  40 mg Intravenous Daily   insulin aspart  0-15  Units Subcutaneous TID WC   insulin aspart  0-5 Units Subcutaneous QHS   insulin glargine-yfgn  15 Units Subcutaneous QHS   levothyroxine  100 mcg Oral Q0600   rOPINIRole  4 mg Oral QHS   sertraline  25 mg Oral Daily   Continuous Infusions:   Anti-infectives (From admission, onward)    Start     Dose/Rate Route Frequency Ordered Stop   10/04/23 1000  azithromycin (ZITHROMAX) tablet 500 mg        500 mg Oral Daily 10/03/23 0751 10/06/23 0856   10/03/23 0600  cefTRIAXone (ROCEPHIN) 2 g in sodium chloride 0.9 % 100 mL IVPB   Status:  Discontinued        2 g 200 mL/hr over 30 Minutes Intravenous Every 24 hours 10/02/23 0740 10/07/23 0854   10/02/23 0745  cefTRIAXone (ROCEPHIN) 2 g in sodium chloride 0.9 % 100 mL IVPB  Status:  Discontinued        2 g 200 mL/hr over 30 Minutes Intravenous Every 24 hours 10/02/23 0738 10/02/23 0740   10/02/23 0745  azithromycin (ZITHROMAX) 500 mg in sodium chloride 0.9 % 250 mL IVPB        500 mg 250 mL/hr over 60 Minutes Intravenous Every 24 hours 10/02/23 0738 10/03/23 1236   10/02/23 0630  cefTRIAXone (ROCEPHIN) 2 g in sodium chloride 0.9 % 100 mL IVPB        2 g 200 mL/hr over 30 Minutes Intravenous  Once 10/02/23 0617 10/02/23 0714              Family Communication/Anticipated D/C date and plan/Code Status   DVT prophylaxis: enoxaparin (LOVENOX) injection 40 mg Start: 10/12/23 1115 SCDs Start: 10/02/23 1610     Code Status: Full Code  Family Communication: None Disposition Plan: She may need SNF at discharge   Status is: Inpatient Remains inpatient appropriate because: Workup in progress       Subjective:   Interval events noted.  No complaints.  No shortness of breath or chest pain.  Objective:    Vitals:   10/19/23 0009 10/19/23 0409 10/19/23 0814 10/19/23 1204  BP: (!) 145/92 136/71 132/64 130/78  Pulse: 83 91 89 93  Resp: 15 16 20 20   Temp: 97.8 F (36.6 C) 98 F (36.7 C) 98.1 F (36.7 C) (!) 97.5 F (36.4 C)  TempSrc: Oral Oral  Axillary  SpO2: 97% 96% 99% 98%  Weight:      Height:       No data found.   Intake/Output Summary (Last 24 hours) at 10/19/2023 1241 Last data filed at 10/18/2023 1716 Gross per 24 hour  Intake 600 ml  Output --  Net 600 ml   Filed Weights   10/05/23 0912  Weight: 74.1 kg    Exam:  GEN: NAD SKIN: Warm and dry EYES: No pallor or icterus ENT: MMM CV: RRR PULM: CTA B ABD: soft, ND, NT, +BS CNS: AAO x 2 (person and place), right-sided hemiparesis EXT: No edema or tenderness         Data Reviewed:   I have personally reviewed following labs and imaging studies:  Labs: Labs show the following:   Basic Metabolic Panel: Recent Labs  Lab 10/14/23 0513  NA 142  K 3.9  CL 106  CO2 29  GLUCOSE 172*  BUN 44*  CREATININE 0.91  CALCIUM 8.5*   GFR Estimated Creatinine Clearance: 53.7 mL/min (by C-G formula based on SCr of 0.91 mg/dL). Liver Function Tests: No  results for input(s): "AST", "ALT", "ALKPHOS", "BILITOT", "PROT", "ALBUMIN" in the last 168 hours. No results for input(s): "LIPASE", "AMYLASE" in the last 168 hours. Recent Labs  Lab 10/15/23 1434  AMMONIA 37*   Coagulation profile No results for input(s): "INR", "PROTIME" in the last 168 hours.  CBC: Recent Labs  Lab 10/14/23 0513  WBC 10.2  HGB 12.1  HCT 37.4  MCV 90.3  PLT 130*   Cardiac Enzymes: No results for input(s): "CKTOTAL", "CKMB", "CKMBINDEX", "TROPONINI" in the last 168 hours. BNP (last 3 results) No results for input(s): "PROBNP" in the last 8760 hours. CBG: Recent Labs  Lab 10/18/23 1919 10/18/23 2113 10/19/23 0038 10/19/23 0846 10/19/23 1159  GLUCAP 285* 233* 153* 139* 187*   D-Dimer: No results for input(s): "DDIMER" in the last 72 hours. Hgb A1c: No results for input(s): "HGBA1C" in the last 72 hours. Lipid Profile: No results for input(s): "CHOL", "HDL", "LDLCALC", "TRIG", "CHOLHDL", "LDLDIRECT" in the last 72 hours. Thyroid function studies: No results for input(s): "TSH", "T4TOTAL", "T3FREE", "THYROIDAB" in the last 72 hours.  Invalid input(s): "FREET3" Anemia work up: No results for input(s): "VITAMINB12", "FOLATE", "FERRITIN", "TIBC", "IRON", "RETICCTPCT" in the last 72 hours. Sepsis Labs: Recent Labs  Lab 10/14/23 0513  WBC 10.2    Microbiology No results found for this or any previous visit (from the past 240 hours).  Procedures and diagnostic studies:  No results found.             LOS: 17 days   Michele House  [employer]  Chartered loss adjuster on [email or web address]. If 7PM-7AM, please contact night-coverage at [email or web address]     10/19/2023, 12:41 PM

## 2023-10-19 NOTE — Progress Notes (Signed)
PULMONOLOGY         Date: 10/19/2023,   MRN# [id number] Michele House May 27, 1956     AdmissionWeight: 74.1 kg                 CurrentWeight: 74.1 kg  Referring provider: Dr Nelson Chimes   CHIEF COMPLAINT:   Lung nodule suspicious for carcinoma   HISTORY OF PRESENT ILLNESS   Patient with COPD, dyslipidemia, HTN, thyroid dysfunction, GERD, She has 1.5cm RUL nodule suspicious for primary bronchogenic carcinoma. She needs to have tissue diagnosis. PCCM consultation for biopsy.   10/15/23- patient encepalopathic on examination.  Have ordered ABG, Uric acid, ammonia levels, renal function is normal, electrolytes normal , sugars improved <200.  Neuro and oncology following, s/p LP, s/p paraneoplastic panel, s/p autoimmune panel.  Planning for lung biopsy on 10/29/23 if patient is more stable and safe candidate for general anesthesia  10/17/23- patient awake and alert, she is able to answer questions appropriately.  Encephalopathy has improved markedly. We discussed lung biopsy she has consented to procedure and wishes to proceed as planned.  10/18/23- patient reports leg pain.  She wants to get OOB to walk with PT.  She seems a bit sleepy during interview.  I have dcd claritin.  10/19/23- patient is improved sitting up in bed eating.  She is alert and lucid.  Mild edema on examination. Have ordered CBC and CMP today.  Will diurese today due to 1+ edema on examination.  NPO today at midnight.  Lung biopsy in AM at 730AM  PAST MEDICAL HISTORY   Past Medical History:  Diagnosis Date   Acid reflux    COPD (chronic obstructive pulmonary disease) (HCC)    Diabetes mellitus without complication (HCC)    Hyperlipidemia    Hypertension    Thyroid disease      SURGICAL HISTORY   Past Surgical History:  Procedure Laterality Date   appendectomy     APPENDECTOMY     BREAST BIOPSY Right    CORE W/CLIP - NEG   TOTAL VAGINAL HYSTERECTOMY     TUMOR REMOVAL     benign;stomach      FAMILY HISTORY   Family History  Problem Relation Age of Onset   Heart attack Mother    Hypertension Mother    Breast cancer Sister 42   Breast cancer Maternal Aunt        40'S   Breast cancer Maternal Grandmother      SOCIAL HISTORY   Social History   Tobacco Use   Smoking status: Former    Current packs/day: 0.00    Types: Cigarettes    Quit date: 12/15/1985    Years since quitting: 37.8   Smokeless tobacco: Never  Substance Use Topics   Alcohol use: No   Drug use: No     MEDICATIONS    Home Medication:    Current Medication:  Current Facility-Administered Medications:    acetaminophen (TYLENOL) tablet 650 mg, 650 mg, Oral, Q6H PRN, Floydene Flock, MD, 650 mg at 10/18/23 0424   clopidogrel (PLAVIX) tablet 75 mg, 75 mg, Oral, Daily, Milon Dikes, MD, 75 mg at 10/18/23 5621   dextromethorphan-guaiFENesin (MUCINEX DM) 30-600 MG per 12 hr tablet 1 tablet, 1 tablet, Oral, BID PRN, Floydene Flock, MD, 1 tablet at 10/18/23 0832   enoxaparin (LOVENOX) injection 40 mg, 40 mg, Subcutaneous, Daily, Wieting, Richard, MD, 40 mg at 10/18/23 0830   insulin aspart (novoLOG) injection 0-15 Units, 0-15 Units,  Subcutaneous, TID WC, Alford Highland, MD, 5 Units at 10/18/23 1831   insulin aspart (novoLOG) injection 0-5 Units, 0-5 Units, Subcutaneous, QHS, Wieting, Richard, MD, 2 Units at 10/18/23 2140   insulin glargine-yfgn (SEMGLEE) injection 15 Units, 15 Units, Subcutaneous, QHS, Amin, Tilman Neat, MD, 15 Units at 10/18/23 2140   ipratropium-albuterol (DUONEB) 0.5-2.5 (3) MG/3ML nebulizer solution 3 mL, 3 mL, Nebulization, Q4H PRN, Floydene Flock, MD, 3 mL at 10/17/23 0917   levothyroxine (SYNTHROID) tablet 100 mcg, 100 mcg, Oral, Q0600, Floydene Flock, MD, 100 mcg at 10/19/23 0557   methocarbamol (ROBAXIN) tablet 500 mg, 500 mg, Oral, Q8H PRN, Arnetha Courser, MD, 500 mg at 10/18/23 1831   Oral care mouth rinse, 15 mL, Mouth Rinse, PRN, Alford Highland, MD   QUEtiapine  (SEROQUEL) tablet 12.5 mg, 12.5 mg, Oral, QHS PRN, Alford Highland, MD, 12.5 mg at 10/19/23 0040   rOPINIRole (REQUIP XL) 24 hr tablet 4 mg, 4 mg, Oral, QHS, Amin, Tilman Neat, MD, 4 mg at 10/18/23 2140   sertraline (ZOLOFT) tablet 25 mg, 25 mg, Oral, Daily, Alford Highland, MD, 25 mg at 10/18/23 1610   traMADol (ULTRAM) tablet 50 mg, 50 mg, Oral, Q6H PRN, Arnetha Courser, MD, 50 mg at 10/18/23 1129    ALLERGIES   Penicillins, Aspirin, and Sulfa antibiotics     REVIEW OF SYSTEMS    Review of Systems:  Gen:  Denies  fever, sweats, chills weigh loss  HEENT: Denies blurred vision, double vision, ear pain, eye pain, hearing loss, nose bleeds, sore throat Cardiac:  No dizziness, chest pain or heaviness, chest tightness,edema Resp:   reports dyspnea chronically  Gi: Denies swallowing difficulty, stomach pain, nausea or vomiting, diarrhea, constipation, bowel incontinence Gu:  Denies bladder incontinence, burning urine Ext:   Denies Joint pain, stiffness or swelling Skin: Denies  skin rash, easy bruising or bleeding or hives Endoc:  Denies polyuria, polydipsia , polyphagia or weight change Psych:   Denies depression, insomnia or hallucinations   Other:  All other systems negative   VS: BP 136/71 (BP Location: Right Arm)   Pulse 91   Temp 98 F (36.7 C) (Oral)   Resp 16   Ht 5' 0.98" (1.549 m)   Wt 74.1 kg   LMP 12/24/1998 Comment: prior to her hysterectomy  SpO2 96%   BMI 30.88 kg/m      PHYSICAL EXAM    GENERAL:NAD, no fevers, chills, no weakness no fatigue HEAD: Normocephalic, atraumatic.  EYES: Pupils equal, round, reactive to light. Extraocular muscles intact. No scleral icterus.  MOUTH: Moist mucosal membrane. Dentition intact. No abscess noted.  EAR, NOSE, THROAT: Clear without exudates. No external lesions.  NECK: Supple. No thyromegaly. No nodules. No JVD.  PULMONARY: decreased breath sounds with mild rhonchi worse at bases bilaterally.  CARDIOVASCULAR: S1 and  S2. Regular rate and rhythm. No murmurs, rubs, or gallops. No edema. Pedal pulses 2+ bilaterally.  GASTROINTESTINAL: Soft, nontender, nondistended. No masses. Positive bowel sounds. No hepatosplenomegaly.  MUSCULOSKELETAL: No swelling, clubbing, or edema. Range of motion full in all extremities.  NEUROLOGIC: Cranial nerves II through XII are intact. No gross focal neurological deficits. Appears tired with excessive sleepiness  SKIN: No ulceration, lesions, rashes, or cyanosis. Skin warm and dry. Turgor intact.  PSYCHIATRIC: Mood, affect within normal limits. The patient is awake, alert and oriented x 3. Insight, judgment intact.       IMAGING   CT chest -  Enlarging spiculated nodule in the inferior right upper lobe now 1.6 cm  compared to 1.3 cm previously.   ASSESSMENT/PLAN   Lung nodule suspicious for primary bronchogenic carcinoma   1.5cm - RUL   -patient would require biopsy - will order bronchoscopy     -Reviewed risks/complications and benefits with patient, risks include infection, pneumothorax/pneumomediastinum which may require chest tube placement as well as overnight/prolonged hospitalization and possible mechanical ventilation. Other risks include bleeding and very rarely death.  Patient understands risks and wishes to proceed.  Additional questions were answered, and patient is aware that post procedure patient will be going home with family and may experience cough with possible clots on expectoration as well as phlegm which may last few days as well as hoarseness of voice post intubation and mechanical ventilation.      Encephalopathy- RESOLVED  S/p neuro and med/onc eval  - ordered ammonia, uric acid, ABG today -reviewed    COPD with bronchitic phenotype   - patient reports tightness in chest today   - she reports panic attack when resp status deteriorates.  She is on 3L/min O2 at this time.    -She uses albuterol and Breo at home   Thank you for allowing me to  participate in the care of this patient.   Patient/Family are satisfied with care plan and all questions have been answered.    Provider disclosure: Patient with at least one acute or chronic illness or injury that poses a threat to life or bodily function and is being managed actively during this encounter.  All of the below services have been performed independently by signing provider:  review of prior documentation from internal and or external health records.  Review of previous and current lab results.  Interview and comprehensive assessment during patient visit today. Review of current and previous chest radiographs/CT scans. Discussion of management and test interpretation with health care team and patient/family.   This document was prepared using Dragon voice recognition software and may include unintentional dictation errors.     Vida Rigger, M.D.  Division of Pulmonary & Critical Care Medicine

## 2023-10-19 NOTE — Plan of Care (Signed)
  Problem: Education: Goal: Ability to describe self-care measures that may prevent or decrease complications (Diabetes Survival Skills Education) will improve Outcome: Progressing Goal: Individualized Educational Video(s) Outcome: Progressing   Problem: Coping: Goal: Ability to adjust to condition or change in health will improve Outcome: Progressing   Problem: Fluid Volume: Goal: Ability to maintain a balanced intake and output will improve Outcome: Progressing   Problem: Health Behavior/Discharge Planning: Goal: Ability to identify and utilize available resources and services will improve Outcome: Progressing Goal: Ability to manage health-related needs will improve Outcome: Progressing   Problem: Metabolic: Goal: Ability to maintain appropriate glucose levels will improve Outcome: Progressing   Problem: Nutritional: Goal: Maintenance of adequate nutrition will improve Outcome: Progressing Goal: Progress toward achieving an optimal weight will improve Outcome: Progressing   Problem: Skin Integrity: Goal: Risk for impaired skin integrity will decrease Outcome: Progressing   Problem: Tissue Perfusion: Goal: Adequacy of tissue perfusion will improve Outcome: Progressing   Problem: Education: Goal: Knowledge of General Education information will improve Description: Including pain rating scale, medication(s)/side effects and non-pharmacologic comfort measures Outcome: Progressing   Problem: Health Behavior/Discharge Planning: Goal: Ability to manage health-related needs will improve Outcome: Progressing   Problem: Clinical Measurements: Goal: Ability to maintain clinical measurements within normal limits will improve Outcome: Progressing Goal: Will remain free from infection Outcome: Progressing Goal: Diagnostic test results will improve Outcome: Progressing Goal: Respiratory complications will improve Outcome: Progressing Goal: Cardiovascular complication will  be avoided Outcome: Progressing   Problem: Activity: Goal: Risk for activity intolerance will decrease Outcome: Progressing   Problem: Nutrition: Goal: Adequate nutrition will be maintained Outcome: Progressing   Problem: Coping: Goal: Level of anxiety will decrease Outcome: Progressing   Problem: Elimination: Goal: Will not experience complications related to bowel motility Outcome: Progressing Goal: Will not experience complications related to urinary retention Outcome: Progressing   Problem: Pain Management: Goal: General experience of comfort will improve Outcome: Progressing   Problem: Safety: Goal: Ability to remain free from injury will improve Outcome: Progressing   Problem: Skin Integrity: Goal: Risk for impaired skin integrity will decrease Outcome: Progressing   Problem: Education: Goal: Knowledge of disease or condition will improve Outcome: Progressing Goal: Knowledge of the prescribed therapeutic regimen will improve Outcome: Progressing Goal: Individualized Educational Video(s) Outcome: Progressing   Problem: Activity: Goal: Ability to tolerate increased activity will improve Outcome: Progressing Goal: Will verbalize the importance of balancing activity with adequate rest periods Outcome: Progressing   Problem: Respiratory: Goal: Ability to maintain a clear airway will improve Outcome: Progressing Goal: Levels of oxygenation will improve Outcome: Progressing Goal: Ability to maintain adequate ventilation will improve Outcome: Progressing   Problem: Activity: Goal: Ability to tolerate increased activity will improve Outcome: Progressing   Problem: Clinical Measurements: Goal: Ability to maintain a body temperature in the normal range will improve Outcome: Progressing   Problem: Respiratory: Goal: Ability to maintain adequate ventilation will improve Outcome: Progressing Goal: Ability to maintain a clear airway will improve Outcome:  Progressing   Problem: Education: Goal: Knowledge of disease or condition will improve Outcome: Progressing Goal: Knowledge of secondary prevention will improve (MUST DOCUMENT ALL) Outcome: Progressing Goal: Knowledge of patient specific risk factors will improve Loraine Leriche N/A or DELETE if not current risk factor) Outcome: Progressing

## 2023-10-19 NOTE — Plan of Care (Signed)
I am asked to comment on any contraindication to anesthesia for bronchoscopy for biopsy for this patient in the setting of stroke on MRI ~1 week ago.   If her exam is stable to what was documented by my partner (who indicated plan was for outpatient biopsy), would proceed with plan to minimize any severe swings in BP, avoidance of large doses of anticoagulation/fibrinolytic agents due to ICH risk and avoidance of hypotonic solutions to minimize risk of cerebral edema (for example, would use normal saline over lactated ringers).   Ultimately any risk is outweighed by benefits (confirmation of malignancy would indicate need to start anticoagulation for hypercoag state of malignancy for stroke prevention; treatment of malignancy would be the most important treatment for paraneoplastic encephalitis)   MRI Brain12/4/24 (personally reviewed) 1. No acute intracranial abnormality. 2. Confluent hyperintense T2-weighted signal within the superior bihemispheric white matter, most commonly indicating chronic microvascular ischemia.   MR brain repeated 10/13/23 IMPRESSION: 1. Multiple acute infarcts in the high bilateral posterior frontal and parietal lobes, potentially watershed territory. Mild associated petechial hemorrhage. 2. 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   CT angio head and neck  10/13/23  with no emergent LVO.  Atheromatous changes about the carotid bifurcations bilaterally up to 50% stenosis.    My partner's exam and assessment on 12/15:   "Neurological exam She is awake alert oriented x 3 Her speech is not dysarthric there is no evidence of aphasia as well. Cranial nerves: Pupils are equal round react light, extraocular movements are unhindered, she brings with her from both sides and is able to count fingers on both sides as well, face appears grossly symmetric, tongue and palate appear midline. Motor examination: Flaccid right upper extremity  and nearly flaccid right lower extremity.  Left side-upper extremity is full strength, left lower extremity is at least 4/5. Sensation is diminished on the right hemibody in comparison to the left with no extinction Coordination: No dysmetria on the left.  Unable to perform on the right"  Assessment "67 year old woman with past medical history significant for COPD, chronic respiratory failure on 2 L of home O2, hypertension, type 2 diabetes, heart failure with preserved ejection fraction who presented with acute on chronic respiratory failure with hypoxia, COPD exacerbation, pneumonia, and UTI. Neurology was consulted for altered mental status.    Due to her acute delirium and altered mental status and the fact that she had pretty rapid decline in her cognition, LP was done which was essentially unremarkable but due to high suspicion of this being some sort of an autoimmune/paraneoplastic process because of CTA of the chest with right upper lobe spiculated nodule, she was given 5 days of steroids which has really not made much of a change.   Repeat imaging with MRI because of more pronounced right-sided weakness was performed-the MRI shows bilateral frontoparietal infarcts almost in a watershed pattern without any significant stenosis in the head and neck vessels   Workup for reversible causes of dementia has been unremarkable   Her pulmonary nodule has increased in size from 3 months ago   Now she has strokes with unclear etiology-no evidence of A-fib, no significant narrowing in the blood vessels of head and neck which makes you wonder if she has hypercoagulability from an underlying malignant process and mentation is also related to a paraneoplastic type of encephalitis   She has had a spinal tap done which has been bland.   Send out labs for autoimmune  encephalopathy in the CSF and serum are pending. 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  She has right hemiplegia.  Question if the mental status is improved in response to the steroids that she received with the last dose of steroids a couple of days ago.  Autoimmune encephalitis still remains in the differential although I do not have any objective evidence in the send out labs are still pending.     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 New acute ischemic strokes raising suspicion for hypercoagulability-no clear etiology of the stroke identifiably"   Brooke Dare MD-PhD [facility] [phone] Available 7 AM to 7 PM, outside these hours please contact Neurologist on call listed on AMION

## 2023-10-19 NOTE — TOC Progression Note (Signed)
Transition of Care [facility]) - Progression Note    Patient Details  Name: Michele House MRN: [id number] Date of Birth: 23-Jun-1956  Transition of Care [facility]) CM/SW Contact  Allena Katz, LCSW Phone Number: 10/19/2023, 9:34 AM  Clinical Narrative:   Pulmonary is trying to do bronchoscopy and biopsy on Wednesday. Pt not ready yet for auth to be started. CSW to follow back up with MD once Berkley Harvey is ready to be started.        Expected Discharge Plan: Skilled Nursing Facility Barriers to Discharge: Continued Medical Work up  Expected Discharge Plan and Services                                               Social Determinants of Health (SDOH) Interventions SDOH Screenings   Food Insecurity: Patient Unable To Answer (10/03/2023)  Recent Concern: Food Insecurity - Food Insecurity Present (09/24/2023)  Housing: High Risk (10/03/2023)  Transportation Needs: Patient Unable To Answer (10/03/2023)  Recent Concern: Transportation Needs - Unmet Transportation Needs (09/24/2023)  Utilities: Patient Unable To Answer (10/03/2023)  Tobacco Use: Medium Risk (10/02/2023)    Readmission Risk Interventions     No data to display

## 2023-10-19 NOTE — Progress Notes (Signed)
SLP Cancellation Note  Patient Details Name: Michele House MRN: [id number] DOB: 09/01/1956   Cancelled treatment:       Reason Eval/Treat Not Completed: Patient at procedure or test/unavailable (Pt undergoing care with nursing staff.)  [name], M.S., CCC-SLP Speech-Language Pathologist [facility] [phone] Arnette Felts)  [name] 10/19/2023, 9:58 AM

## 2023-10-19 NOTE — Progress Notes (Signed)
Speech Language Pathology Treatment: Dysphagia;Cognitive-Linquistic  Patient Details Name: Michele House MRN: [id number] DOB: October 03, 1956 Today's Date: 10/19/2023 Time: 1315-1340 SLP Time Calculation (min) (ACUTE ONLY): 25 min  Assessment / Plan / Recommendation Clinical Impression  Pt seen for cognitive-linguistic tx. Pt AxOx3; not to time. Mod/max cueing needed for pt to utilize environment aids for orientation. Pt required mod verbal cues to attend to tx tasks. Pt unable to recall use of call bell or "911." Pt slow to respond at times.   Pt seen for diet tolerance. Pt observed with items from Dysphagia 3 with Thin Liquid meal tray as fed by NT. Pt continues to present with s/sx mild oral dysphagia c/b mildly prolonged mastication of soft solids which is likely due to dental status and exacerbated by mental status. Pharyngeal swallow appeared [facility] per clinical observation. Min cues to attend to feeder. Pt is at increased risk for aspiration/aspiration PNA given dental status, mental status, multiple medical comorbidities, and dependence for feeding at present.   Recommend continuation of current diet and safe swallowing strategies/aspiration precautions and 1:1 assistance with feeding. No f/u for swallowing recommened at this time. Pt OK to upgrade solids when denture present.   Recommend continued SLP services acute/post-acute for cognitive-linguistic deficits. Diet advancement by MD when denture present.   HPI HPI: Pt is a 67 y/o F admitted on 10/02/23 after presenting with acute on chronic respiratory failure with hypoxia, COPD exacerbation, PNA, UTI, & encephalopathy. PMH: COPD, chronic respiratory failure on 2L,HTN, DM2, HFpEF. Head CT/CTA, 12/11, "CT HEAD:     1. 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 No  associated hemorrhage evident by CT. No significant regional mass  effect.  2. No other new acute  intracranial abnormality.  3. 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 No hydrocephalus.  4. Age-related cerebral atrophy with mild chronic microvascular  ischemic disease.     CTA HEAD AND NECK:     1. Negative CTA for large vessel occlusion or other emergent  finding.  2. 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  3. 1.7 cm spiculated right upper lobe pulmonary nodule. Again,  further evaluation with dedicated PET-CT recommended."      SLP Plan         Recommendations for follow up therapy are one component of a multi-disciplinary discharge planning process, led by the attending physician.  Recommendations may be updated based on patient status, additional functional criteria and insurance authorization.    Recommendations  Diet recommendations: Dysphagia 3 (mechanical soft);Thin liquid Liquids provided via: Teaspoon;Cup;Straw Medication Administration: Crushed with puree Supervision: Staff to assist with self feeding;Trained caregiver to feed patient;Full supervision/cueing for compensatory strategies Compensations: Minimize environmental distractions;Slow rate;Small sips/bites Postural Changes and/or Swallow Maneuvers: Out of bed for meals;Seated upright 90 degrees;Upright 30-60 min after meal                  Oral care QID;Staff/trained caregiver to provide oral care   Frequent or constant Supervision/Assistance Cognitive communication deficit (R41.841);Dysphagia, oral phase (R13.11)          Michele House, M.S., CCC-SLP Speech-Language Pathologist [facility] [phone] (ASCOM)  [name]  10/19/2023, 1:52 PM

## 2023-10-19 NOTE — Anesthesia Preprocedure Evaluation (Addendum)
Anesthesia Evaluation  Patient identified by MRN, date of birth, ID band Patient confused    Reviewed: Allergy & Precautions, NPO status , Patient's Chart, lab work & pertinent test results  History of Anesthesia Complications (+) PROLONGED EMERGENCE, Emergence Delirium and history of anesthetic complications  Airway Mallampati: III  TM Distance: >3 FB Neck ROM: full    Dental  (+) Upper Dentures, Lower Dentures   Pulmonary shortness of breath, pneumonia, COPD, neg recent URI, Patient abstained from smoking., former smoker   + rhonchi  + decreased breath sounds      Cardiovascular hypertension, (-) angina +CHF  (-) Past MI and (-) Cardiac Stents Normal cardiovascular exam(-) dysrhythmias (-) Valvular Problems/Murmurs     Neuro/Psych neg Seizures CVA, Residual Symptoms  negative psych ROS   GI/Hepatic Neg liver ROS,GERD  Controlled,,  Endo/Other  diabetes    Renal/GU      Musculoskeletal   Abdominal   Peds  Hematology negative hematology ROS (+)   Anesthesia Other Findings Past Medical History: No date: Acid reflux No date: COPD (chronic obstructive pulmonary disease) (HCC) No date: Diabetes mellitus without complication (HCC) No date: Hyperlipidemia No date: Hypertension No date: Thyroid disease  Past Surgical History: No date: appendectomy No date: APPENDECTOMY No date: BREAST BIOPSY; Right     Comment:  CORE W/CLIP - NEG No date: TOTAL VAGINAL HYSTERECTOMY No date: TUMOR REMOVAL     Comment:  benign;stomach  BMI    Body Mass Index: 30.88 kg/m      Reproductive/Obstetrics negative OB ROS                             Anesthesia Physical Anesthesia Plan  ASA: 4  Anesthesia Plan: General   Post-op Pain Management:    Induction: Intravenous  PONV Risk Score and Plan: 3 and Ondansetron, Dexamethasone, Midazolam and Treatment may vary due to age or medical  condition  Airway Management Planned: Oral ETT  Additional Equipment:   Intra-op Plan:   Post-operative Plan: Possible Post-op intubation/ventilation and Extubation in OR  Informed Consent: I have reviewed the patients History and Physical, chart, labs and discussed the procedure including the risks, benefits and alternatives for the proposed anesthesia with the patient or authorized representative who has indicated his/her understanding and acceptance.     Dental Advisory Given  Plan Discussed with: Anesthesiologist, CRNA and Surgeon  Anesthesia Plan Comments: (Patient has neurology clearance for the procedure from Dr. Iver Nestle  History and phone consent from the patients daughter Gunnar Fusi at [phone]  Daughter consented for risks of anesthesia including but not limited to:  - thorough discussion risk of stroke or re stroke, daughter voiced assent - adverse reactions to medications - damage to eyes, teeth, lips or other oral mucosa - nerve damage due to positioning  - sore throat or hoarseness - Damage to heart, brain, nerves, lungs, other parts of body or loss of life  She voiced understanding and assent.)       Anesthesia Quick Evaluation

## 2023-10-20 ENCOUNTER — Other Ambulatory Visit: Payer: Self-pay | Admitting: Pulmonary Disease

## 2023-10-20 ENCOUNTER — Encounter
Admission: EM | Disposition: A | Discharge status: Skilled Nursing Facility | Payer: Self-pay | Source: Home / Self Care | Attending: Internal Medicine

## 2023-10-20 ENCOUNTER — Inpatient Hospital Stay: Payer: 59

## 2023-10-20 ENCOUNTER — Encounter: Payer: Self-pay | Admitting: Family Medicine

## 2023-10-20 ENCOUNTER — Inpatient Hospital Stay: Payer: 59 | Admitting: Anesthesiology

## 2023-10-20 DIAGNOSIS — G9341 Metabolic encephalopathy: Secondary | ICD-10-CM | POA: Diagnosis not present

## 2023-10-20 HISTORY — PX: VIDEO BRONCHOSCOPY WITH ENDOBRONCHIAL NAVIGATION: SHX6175

## 2023-10-20 LAB — GLUCOSE, CAPILLARY
Glucose-Capillary: 142 mg/dL — ABNORMAL HIGH (ref 70–99)
Glucose-Capillary: 180 mg/dL — ABNORMAL HIGH (ref 70–99)
Glucose-Capillary: 195 mg/dL — ABNORMAL HIGH (ref 70–99)
Glucose-Capillary: 365 mg/dL — ABNORMAL HIGH (ref 70–99)
Glucose-Capillary: 324 mg/dL — ABNORMAL HIGH (ref 70–99)

## 2023-10-20 SURGERY — VIDEO BRONCHOSCOPY WITH ENDOBRONCHIAL NAVIGATION
Anesthesia: General | Laterality: Right

## 2023-10-20 MED ORDER — EPHEDRINE SULFATE-NACL 50-0.9 MG/10ML-% IV SOSY
PREFILLED_SYRINGE | INTRAVENOUS | Status: DC | PRN
  Administered 2023-10-20 (×2): 10 mg via INTRAVENOUS

## 2023-10-20 MED ORDER — PHENYLEPHRINE HCL-NACL 20-0.9 MG/250ML-% IV SOLN
INTRAVENOUS | Status: DC | PRN
  Administered 2023-10-20: 50 ug/min via INTRAVENOUS

## 2023-10-20 MED ORDER — LIDOCAINE HCL (CARDIAC) PF 100 MG/5ML IV SOSY
PREFILLED_SYRINGE | INTRAVENOUS | Status: DC | PRN
  Administered 2023-10-20: 60 mg via INTRAVENOUS

## 2023-10-20 MED ORDER — ONDANSETRON HCL 4 MG/2ML IJ SOLN
INTRAMUSCULAR | Status: AC
  Filled 2023-10-20: qty 2

## 2023-10-20 MED ORDER — ONDANSETRON HCL 4 MG/2ML IJ SOLN
INTRAMUSCULAR | Status: DC | PRN
  Administered 2023-10-20: 4 mg via INTRAVENOUS

## 2023-10-20 MED ORDER — LIDOCAINE HCL (PF) 2 % IJ SOLN
INTRAMUSCULAR | Status: AC
  Filled 2023-10-20: qty 5

## 2023-10-20 MED ORDER — PHENYLEPHRINE HCL-NACL 20-0.9 MG/250ML-% IV SOLN
INTRAVENOUS | Status: AC
  Filled 2023-10-20: qty 250

## 2023-10-20 MED ORDER — MENTHOL 3 MG MT LOZG
1.0000 | LOZENGE | OROMUCOSAL | Status: DC | PRN
  Administered 2023-10-20 – 2023-10-22 (×3): 3 mg via ORAL
  Filled 2023-10-20: qty 9

## 2023-10-20 MED ORDER — SUCCINYLCHOLINE CHLORIDE 200 MG/10ML IV SOSY
PREFILLED_SYRINGE | INTRAVENOUS | Status: DC | PRN
  Administered 2023-10-20: 120 mg via INTRAVENOUS

## 2023-10-20 MED ORDER — ROCURONIUM BROMIDE 100 MG/10ML IV SOLN
INTRAVENOUS | Status: DC | PRN
  Administered 2023-10-20: 20 mg via INTRAVENOUS
  Administered 2023-10-20: 30 mg via INTRAVENOUS

## 2023-10-20 MED ORDER — PHENYLEPHRINE 80 MCG/ML (10ML) SYRINGE FOR IV PUSH (FOR BLOOD PRESSURE SUPPORT)
PREFILLED_SYRINGE | INTRAVENOUS | Status: DC | PRN
  Administered 2023-10-20 (×3): 160 ug via INTRAVENOUS
  Administered 2023-10-20: 80 ug via INTRAVENOUS

## 2023-10-20 MED ORDER — PROPOFOL 10 MG/ML IV BOLUS
INTRAVENOUS | Status: DC | PRN
  Administered 2023-10-20: 100 mg via INTRAVENOUS

## 2023-10-20 MED ORDER — SUGAMMADEX SODIUM 200 MG/2ML IV SOLN
INTRAVENOUS | Status: DC | PRN
  Administered 2023-10-20: 200 mg via INTRAVENOUS

## 2023-10-20 MED ORDER — PROPOFOL 10 MG/ML IV BOLUS
INTRAVENOUS | Status: AC
  Filled 2023-10-20: qty 20

## 2023-10-20 MED ORDER — FENTANYL CITRATE (PF) 100 MCG/2ML IJ SOLN
INTRAMUSCULAR | Status: AC
  Filled 2023-10-20: qty 2

## 2023-10-20 MED ORDER — FENTANYL CITRATE (PF) 100 MCG/2ML IJ SOLN
INTRAMUSCULAR | Status: DC | PRN
  Administered 2023-10-20: 50 ug via INTRAVENOUS

## 2023-10-20 MED ORDER — SODIUM CHLORIDE 0.9 % IV SOLN: INTRAVENOUS | Status: DC | PRN

## 2023-10-20 MED ORDER — DEXAMETHASONE SODIUM PHOSPHATE 10 MG/ML IJ SOLN
INTRAMUSCULAR | Status: DC | PRN
  Administered 2023-10-20: 10 mg via INTRAVENOUS

## 2023-10-20 NOTE — Plan of Care (Signed)
Problem: Education: Goal: Ability to describe self-care measures that may prevent or decrease complications (Diabetes Survival Skills Education) will improve Outcome: Progressing Goal: Individualized Educational Video(s) Outcome: Progressing   Problem: Coping: Goal: Ability to adjust to condition or change in health will improve Outcome: Progressing   Problem: Fluid Volume: Goal: Ability to maintain a balanced intake and output will improve Outcome: Progressing   Problem: Health Behavior/Discharge Planning: Goal: Ability to identify and utilize available resources and services will improve Outcome: Progressing Goal: Ability to manage health-related needs will improve Outcome: Progressing   Problem: Metabolic: Goal: Ability to maintain appropriate glucose levels will improve Outcome: Progressing   Problem: Nutritional: Goal: Maintenance of adequate nutrition will improve Outcome: Progressing Goal: Progress toward achieving an optimal weight will improve Outcome: Progressing   Problem: Skin Integrity: Goal: Risk for impaired skin integrity will decrease Outcome: Progressing   Problem: Tissue Perfusion: Goal: Adequacy of tissue perfusion will improve Outcome: Progressing   Problem: Education: Goal: Knowledge of General Education information will improve Description: Including pain rating scale, medication(s)/side effects and non-pharmacologic comfort measures Outcome: Progressing   Problem: Health Behavior/Discharge Planning: Goal: Ability to manage health-related needs will improve Outcome: Progressing   Problem: Clinical Measurements: Goal: Ability to maintain clinical measurements within normal limits will improve Outcome: Progressing Goal: Will remain free from infection Outcome: Progressing Goal: Diagnostic test results will improve Outcome: Progressing Goal: Respiratory complications will improve Outcome: Progressing Goal: Cardiovascular complication will  be avoided Outcome: Progressing   Problem: Activity: Goal: Risk for activity intolerance will decrease Outcome: Progressing   Problem: Nutrition: Goal: Adequate nutrition will be maintained Outcome: Progressing   Problem: Coping: Goal: Level of anxiety will decrease Outcome: Progressing   Problem: Elimination: Goal: Will not experience complications related to bowel motility Outcome: Progressing Goal: Will not experience complications related to urinary retention Outcome: Progressing   Problem: Pain Management: Goal: General experience of comfort will improve Outcome: Progressing   Problem: Safety: Goal: Ability to remain free from injury will improve Outcome: Progressing   Problem: Skin Integrity: Goal: Risk for impaired skin integrity will decrease Outcome: Progressing   Problem: Education: Goal: Knowledge of disease or condition will improve Outcome: Progressing Goal: Knowledge of the prescribed therapeutic regimen will improve Outcome: Progressing Goal: Individualized Educational Video(s) Outcome: Progressing   Problem: Activity: Goal: Ability to tolerate increased activity will improve Outcome: Progressing Goal: Will verbalize the importance of balancing activity with adequate rest periods Outcome: Progressing   Problem: Respiratory: Goal: Ability to maintain a clear airway will improve Outcome: Progressing Goal: Levels of oxygenation will improve Outcome: Progressing Goal: Ability to maintain adequate ventilation will improve Outcome: Progressing   Problem: Activity: Goal: Ability to tolerate increased activity will improve Outcome: Progressing   Problem: Clinical Measurements: Goal: Ability to maintain a body temperature in the normal range will improve Outcome: Progressing   Problem: Respiratory: Goal: Ability to maintain adequate ventilation will improve Outcome: Progressing Goal: Ability to maintain a clear airway will improve Outcome:  Progressing   Problem: Education: Goal: Knowledge of disease or condition will improve Outcome: Progressing Goal: Knowledge of secondary prevention will improve (MUST DOCUMENT ALL) Outcome: Progressing Goal: Knowledge of patient specific risk factors will improve Loraine Leriche N/A or DELETE if not current risk factor) Outcome: Progressing   Problem: Ischemic Stroke/TIA Tissue Perfusion: Goal: Complications of ischemic stroke/TIA will be minimized Outcome: Progressing   Problem: Coping: Goal: Will verbalize positive feelings about self Outcome: Progressing Goal: Will identify appropriate support needs Outcome: Progressing   Problem: Health  Behavior/Discharge Planning: Goal: Ability to manage health-related needs will improve Outcome: Progressing Goal: Goals will be collaboratively established with patient/family Outcome: Progressing   Problem: Self-Care: Goal: Ability to participate in self-care as condition permits will improve Outcome: Progressing Goal: Verbalization of feelings and concerns over difficulty with self-care will improve Outcome: Progressing Goal: Ability to communicate needs accurately will improve Outcome: Progressing   Problem: Nutrition: Goal: Risk of aspiration will decrease Outcome: Progressing Goal: Dietary intake will improve Reactivated

## 2023-10-20 NOTE — Procedures (Signed)
ROBOTIC NAVIGATIONAL BRONCHOSCOPY PROCEDURE NOTE  FIBEROPTIC BRONCHOSCOPY WITH BRONCHOALVEOLAR LAVAGE PROCEDURE NOTE  ENDOBRONCHIAL ULTRASOUND PROCEDURE NOTE  BRONCHOSCOPY WITH TRANSBRONCHIAL BIOPSY   FLEXIBLE BRONCHOSCOPY WITH THERAPEUTIC ASPIRATION OF TRACHEOBRONCHIAL TREE  Flexible bronchoscopy was performed  by : Karna Christmas MD  assistance by : 1)Repiratory therapist  and 2) cytotech staff and 3) Anesthesia team and 4) Flouroscopy team and 5) [facility] supporting staff   Indication for the procedure was :  Pre-procedural H&P. The following assessment was performed on the day of the procedure prior to initiating sedation History:  Chest pain n Dyspnea y Hemoptysis n Cough y Fever n Other pertinent items n  Examination Vital signs -reviewed as per nursing documentation today Cardiac    Murmurs: n  Rubs : n  Gallop: n Lungs Wheezing: n Rales : n Rhonchi :y  Other pertinent findings: SOB/hypoxemia due to chronic lung disease   Pre-procedural assessment for Procedural Sedation included: Depth of sedation: As per anesthesia team  ASA Classification:  2 Mallampati airway assessment: 3    Medication list reviewed: y  The patient's interval history was taken and revealed: no new complaints The pre- procedure physical examination revealed: No new findings Refer to prior clinic note for details.  Informed Consent: Informed consent was obtained from:  patient after explanation of procedure and risks, benefits, as well as alternative procedures available.  Explanation of level of sedation and possible transfusion was also provided.    Procedural Preparation: Time out was performed and patient was identified by name and birthdate and procedure to be performed and side for sampling, if any, was specified. Pt was intubated by anesthesia.  The patient was appropriately draped.   Fiberoptic bronchoscopy with airway inspection and BAL Procedure findings:  Bronchoscope was  inserted via ETT  without difficulty.  Posterior oropharynx, epiglottis, arytenoids, false cords and vocal cords were not visualized as these were bypassed by endotracheal tube. The distal trachea was normal in circumference and appearance without mucosal, cartilaginous or branching abnormalities.  The main carina was mildly splayed . All right and left lobar airways were visualized to the Subsegmental level.  Sub- sub segmental carinae were identified in all the distal airways.   Secretions were visible in the following airways and appeared to be clear.  The mucosa was : friable at RUL  Airways were notable for:        exophytic lesions :n       extrinsic compression in the following distributions: n.       Friable mucosa: y       Teacher, music /pigmentation: n   MUCUS PLUGGING WAS SEVERE AND BILATERAL REQUIRED MULTIPLE LOBES THERAPEUTIC ASPIRATION OF TRACHEOBRONCHIAL TREE INCLUDING: RUL, RML, RLL, LLL, LUNGULAR SEGMENTS  Post procedure Diagnosis:   SEVERE MUCUS PLUGGING, SPLAYED MAIN CARINA, EDEMATOUS AND STENOTIC RIGHT UPPER LOBE.         Electromagnetic Navigational Bronchoscopy Procedure Findings:    Post appropriate planning and registration peripheral navigation was used to visualize target lesion.    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 Examination began to evaluate for pathologically enlarged lymph nodes starting on the LEFT side progressing to the RIGHT side.  All lymph node  biopsies performed with 21G needle. Lymph node biopsies were sent in cytolite for all stations.  STATION 4L- - NOT BIOPSIED STATION 10L - -  NOT BIOPSIED STATION 7 - 1.4CM - BIOPSIED 3 TIMES STATION 10R - - NOT BIOPSIED STATION 4R- - NOT BIOPSIED   Post procedure diagnosis:  ENLARGED SUBCARINAL LYMPH NODE SUSPICIOUS FOR POSSIBLE METASTATIC INVASION VS REACTIVE   Specimens obtained included:                Cytology brushes : RUL - ATYPICAL CELLS  Broncho-alveolar lavage site:RUL  sent for MICRO AND CYTOLOGY                              40ml volume infused 30ml volume returned with CELLULAR SEROSANG appearance  Endobronchial biopsy site:  RUL ; sent for PATHOLOGY - LESIONAL CARCINOMA                                 Transbronchial biopsy site: RUL; sent for CYTOLOGY        Fluoroscopy Used: YES;        Pictorial documentation attached: NONE                  Transbronchial WANG needle aspiration site: RUL sent for: CYTOLOGY                                     Immediate sampling complications included:NONE  Epinephrine NONE ml was used topically  The bronchoscopy was terminated due to completion of the planned procedure and the bronchoscope was removed.   Total dosage of Lidocaine was 4mg  Total fluoroscopy time was AS PER RADIOLOGY  minutes  Supplemental oxygen was provided at AS PER ANESTHESIA lpm by nasal canula post operatively  Estimated Blood loss: <EXPECTED 10cc.  Complications included:  NONE IMMEDIATE   Preliminary CXR findings :  IN PROCESS   Disposition: STILL ADMITTED IN HOSPITAL   Follow up with Dr. Karna Christmas in 5 days for result discussion.     Michele Rigger MD  [facility] Division of Pulmonary & Critical Care Medicine

## 2023-10-20 NOTE — Progress Notes (Signed)
Progress Note    Michele House  OZH:[id number] DOB: 10-31-1956  DOA: 10/02/2023 PCP: [facility]      Brief Narrative:    Medical records reviewed and are as summarized below:  Michele House is a 67 y.o. female with medical history significant of COPD, chronic respiratory failure on 2 L, hypertension, type 2 diabetes, HFpEF, recent discharge from the hospital 24th November 2024 after hospitalization for COPD exacerbation complicated by respiratory failure.  She presented to the hospital again on 10/02/2023 with chest pain, malaise, cough, shortness of breath, abdominal pain and dysuria.  She was admitted to the hospital for COPD exacerbation, acute on chronic hypoxemic respiratory failure, UTI and pneumonia.  She became more confused in the hospital and MRI brain revealed acute stroke.        12/1: Vital stable, respiratory viral panel negative, procalcitonin negative, preliminary blood cultures negative.  Urine cultures are ordered as add-on, ammonia levels mildly elevated at 39, strep pneumo negative, CBG elevated at 244, history of enlarging pulmonary nodule with recommendations for PET/CT during most recent admission which has not been done yet, if video bronchoscopy was scheduled for 10/13/2023 as outpatient.  Unable to take care of himself and son cannot provide the required care as she required 24-hour supervision due to worsening dementia.  PT is recommending SNF.  12/2: Hemodynamically stable, now on baseline oxygen of 2 L.  Needs SNF placement. Urine cultures pending.  12/3: Blood pressure started trending up, restarting home Zestoretic.  Urine cultures with strep agalactiae, penicillin allergy noted, will continue with ceftriaxone for now.  Also started on Remeron for concern of worsening depression and unable to sleep at night  12/4.  Some very concerned about patient's mental status not being seen.  Patient receiving antibiotics already  and should have improved by now.  Will hold Lipitor.  Restart low-dose Zoloft.  Discontinue Remeron and do Seroquel at night. 12/5.  Patient has some blurry vision and weakness and pain.  MRI of the brain negative for acute intracranial abnormality. 12/6.  Patient stated she slept well.  Patient able to lift up her arms up off the bed today.  Patient thought she heard her son's voice outside the room. 12/7.  Patient sitting up in chair when I saw her.  Was able to feed herself breakfast.  Felt okay.  Did not offer any complaints. 12/8.  Patient more confused today than yesterday.  Able to follow some simple commands but not as good of a conversation today as yesterday. 12/9.  Patient still confused today.  Does not follow simple commands today. 12/10.  Patient more talkative today but was looking up at the ceiling when she was talking with me.  She does not move her extremities to command but does move them on her own.  Physical therapy and Occupational Therapy did not see her move her right arm. 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  Mild associated petechial hemorrhage.  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 12/12: Patient with new stroke, CTA head and neck with no large vessel occlusion.  Patient had a recent echocardiogram which was normal. CSF cultures remain negative.  Lipid panel with increased triglyceride at 347, HDL 29 and LDL of 38.  CBC with mild thrombocytopenia at 130, BMP mostly unremarkable.  Patient with significant right-sided weakness. Concern of paraneoplastic versus hypercoagulability secondary to malignancy.  Patient missed her appointment for bronchoscopy and outpatient appointment for PET scan due to recurrent hospitalizations. Involving pulmonary and oncology for their input. Send  out encephalitis panel still pending.  12/13: Patient continued to have waxing and waning mental status, unable to move right upper and lower extremities.  Having some hallucination.  Pulmonary is planning lung biopsy on 12/27 if she remains stable.  Rapidly progressive dementia and now with underlying stroke, question of paraneoplastic encephalitis and hypercoagulability secondary to underlying undiagnosed malignancy. Patient will also get benefit from neuropsych evaluation as outpatient.  12/14: Patient with some improved mentation today.  Trying to squeeze with right hand but still no movements of right lower extremity.  12/15: Patient with much improved mentation.  Having some flickering of movements on right upper and lower extremity today, left extremities weaker but seems improving.   12/16: Mental status seems stable, flaccid right upper and lower extremities.  Complaining of muscle spasms so Robaxin was ordered.  Pulmonary is trying to do bronchoscopy and biopsy on Wednesday.      Assessment/Plan:   Principal Problem:   Acute metabolic encephalopathy Active Problems:   CVA (cerebral vascular accident) (HCC)   Uncontrolled type 2 diabetes mellitus with hypoglycemia, without long-term current use of insulin (HCC)   Hypotension   Acute on chronic respiratory failure with hypoxia (HCC)   Hypernatremia   COPD exacerbation (HCC)   Lung nodule   Pneumonia   UTI (urinary tract infection)   (HFpEF) heart failure with preserved ejection fraction (HCC)   Essential hypertension   Type 2 diabetes mellitus with obesity (HCC)    Body mass index is 30.92 kg/m.   Acute metabolic encephalopathy: Improved. She completed 5 days of high-dose several IV Solu-Medrol on 10/12/2023 Differential diagnosis include autoimmune encephalitis, leptomeningeal spread from spiculated nodule    Acute stroke with right hemiplegia: Continue Plavix.  If she is found to have underlying malignancy and  hypercoagulable state then she may need anticoagulation instead of Plavix per neurologist. 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   Type II DM with hyperglycemia: Continue Semglee and NovoLog.   Hypotension: Improved   Pneumonia: Completed 5 days of IV ceftriaxone and azithromycin Acute UTI: Urine culture showed strep state and ATN.  Completed IV ceftriaxone.   COPD exacerbation: Resolved.  Continue bronchodilators Acute on chronic hypoxemic respiratory failure: Continue 2 L/min oxygen via nasal cannula   1.3 cm right upper lung spiculated nodule: S/p bronchoscopy on 10/20/2023.  Follow-up pathology report.  Patient had missed her appointments for follow-up in the past.      Diet Order             DIET DYS 3 Room service appropriate? Yes; Fluid consistency: Thin  Diet effective now                            Consultants: Pulmonologist Hematologist  Procedures: Lumbar puncture on 10/08/2023 Bronchoscopy on 10/20/2023    Medications:    clopidogrel  75 mg Oral Daily   enoxaparin (LOVENOX) injection  40 mg Subcutaneous Daily   furosemide  40 mg Intravenous Daily   insulin aspart  0-15 Units Subcutaneous TID WC   insulin aspart  0-5 Units Subcutaneous QHS  insulin glargine-yfgn  15 Units Subcutaneous QHS   levothyroxine  100 mcg Oral Q0600   rOPINIRole  4 mg Oral QHS   sertraline  25 mg Oral Daily   Continuous Infusions:   Anti-infectives (From admission, onward)    Start     Dose/Rate Route Frequency Ordered Stop   10/04/23 1000  azithromycin (ZITHROMAX) tablet 500 mg        500 mg Oral Daily 10/03/23 0751 10/06/23 0856   10/03/23 0600  cefTRIAXone (ROCEPHIN) 2 g in sodium chloride 0.9 % 100 mL IVPB  Status:  Discontinued        2 g 200 mL/hr over  30 Minutes Intravenous Every 24 hours 10/02/23 0740 10/07/23 0854   10/02/23 0745  cefTRIAXone (ROCEPHIN) 2 g in sodium chloride 0.9 % 100 mL IVPB  Status:  Discontinued        2 g 200 mL/hr over 30 Minutes Intravenous Every 24 hours 10/02/23 0738 10/02/23 0740   10/02/23 0745  azithromycin (ZITHROMAX) 500 mg in sodium chloride 0.9 % 250 mL IVPB        500 mg 250 mL/hr over 60 Minutes Intravenous Every 24 hours 10/02/23 0738 10/03/23 1236   10/02/23 0630  cefTRIAXone (ROCEPHIN) 2 g in sodium chloride 0.9 % 100 mL IVPB        2 g 200 mL/hr over 30 Minutes Intravenous  Once 10/02/23 0617 10/02/23 0714              Family Communication/Anticipated D/C date and plan/Code Status   DVT prophylaxis: enoxaparin (LOVENOX) injection 40 mg Start: 10/12/23 1115 SCDs Start: 10/02/23 1308     Code Status: Full Code  Family Communication: None Disposition Plan: She may need SNF at discharge   Status is: Inpatient Remains inpatient appropriate because: Workup in progress       Subjective:   Interval events noted.  She had bronchoscopy this morning.  She complains of discomfort in the throat.  Physical therapists were at the bedside  Objective:    Vitals:   10/20/23 0945 10/20/23 1000 10/20/23 1015 10/20/23 1052  BP: (!) 127/59 121/81 (!) 161/69 124/74  Pulse: 97 (!) 101 (!) 108 (!) 103  Resp: 12 16 16 16   Temp: 98.1 F (36.7 C)   98.2 F (36.8 C)  TempSrc:      SpO2: 99% 99% 95% 92%  Weight:      Height:       No data found.   Intake/Output Summary (Last 24 hours) at 10/20/2023 1416 Last data filed at 10/20/2023 0947 Gross per 24 hour  Intake 880 ml  Output 1855 ml  Net -975 ml   Filed Weights   10/05/23 0912 10/20/23 0732  Weight: 74.1 kg 74.1 kg    Exam:  GEN: NAD SKIN: Warm and dry EYES: No pallor or icterus ENT: MMM CV: RRR PULM: CTA B ABD: soft, ND, NT, +BS CNS: AAO x 3, right-sided hemiplegia EXT: No edema or tenderness      Data  Reviewed:   I have personally reviewed following labs and imaging studies:  Labs: Labs show the following:   Basic Metabolic Panel: Recent Labs  Lab 10/14/23 0513 10/19/23 1252  NA 142 135  K 3.9 4.5  CL 106 93*  CO2 29 33*  GLUCOSE 172* 201*  BUN 44* 13  CREATININE 0.91 0.81  CALCIUM 8.5* 8.9   GFR Estimated Creatinine Clearance: 60.3 mL/min (by C-G formula based on SCr of 0.81 mg/dL). Liver Function  Tests: Recent Labs  Lab 10/19/23 1252  AST 18  ALT 18  ALKPHOS 70  BILITOT <0.2  PROT 5.4*  ALBUMIN 3.1*   No results for input(s): "LIPASE", "AMYLASE" in the last 168 hours. Recent Labs  Lab 10/15/23 1434  AMMONIA 37*   Coagulation profile No results for input(s): "INR", "PROTIME" in the last 168 hours.  CBC: Recent Labs  Lab 10/14/23 0513 10/19/23 1252  WBC 10.2 6.7  NEUTROABS  --  5.3  HGB 12.1 9.6*  HCT 37.4 29.5*  MCV 90.3 90.8  PLT 130* 188   Cardiac Enzymes: No results for input(s): "CKTOTAL", "CKMB", "CKMBINDEX", "TROPONINI" in the last 168 hours. BNP (last 3 results) No results for input(s): "PROBNP" in the last 8760 hours. CBG: Recent Labs  Lab 10/19/23 1554 10/19/23 2044 10/20/23 0720 10/20/23 0944 10/20/23 1050  GLUCAP 216* 246* 142* 180* 195*   D-Dimer: No results for input(s): "DDIMER" in the last 72 hours. Hgb A1c: No results for input(s): "HGBA1C" in the last 72 hours. Lipid Profile: No results for input(s): "CHOL", "HDL", "LDLCALC", "TRIG", "CHOLHDL", "LDLDIRECT" in the last 72 hours. Thyroid function studies: No results for input(s): "TSH", "T4TOTAL", "T3FREE", "THYROIDAB" in the last 72 hours.  Invalid input(s): "FREET3" Anemia work up: No results for input(s): "VITAMINB12", "FOLATE", "FERRITIN", "TIBC", "IRON", "RETICCTPCT" in the last 72 hours. Sepsis Labs: Recent Labs  Lab 10/14/23 0513 10/19/23 1252  WBC 10.2 6.7    Microbiology No results found for this or any previous visit (from the past 240  hours).  Procedures and diagnostic studies:  DG Chest Port 1 View Result Date: 10/20/2023 CLINICAL DATA:  Status post bronchoscopy. EXAM: PORTABLE CHEST 1 VIEW COMPARISON:  October 15, 2023.  October 19, 2023. FINDINGS: No definite pneumothorax is noted status post bronchoscopy. Right perihilar nodule is noted. Left lung is unremarkable. IMPRESSION: No definite pneumothorax status post bronchoscopy. Electronically Signed   By: Lupita Raider M.D.   On: 10/20/2023 13:52   DG C-Arm 1-60 Min-No Report Result Date: 10/20/2023 Fluoroscopy was utilized by the requesting physician.  No radiographic interpretation.   CT SUPER D CHEST WO MONARCH PILOT Result Date: 10/19/2023 CLINICAL DATA:  Right upper lobe nodule. EXAM: CT CHEST WITHOUT CONTRAST TECHNIQUE: Multidetector CT imaging of the chest was performed using thin slice collimation for electromagnetic bronchoscopy planning purposes, without intravenous contrast. RADIATION DOSE REDUCTION: This exam was performed according to the departmental dose-optimization program which includes automated exposure control, adjustment of the mA and/or kV according to patient size and/or use of iterative reconstruction technique. COMPARISON:  09/24/2023 FINDINGS: Cardiovascular: The heart size is normal. No substantial pericardial effusion. Mild atherosclerotic calcification is noted in the wall of the thoracic aorta. Coronary artery calcification is evident. Mediastinum/Nodes: No mediastinal lymphadenopathy. No evidence for gross hilar lymphadenopathy although assessment is limited by the lack of intravenous contrast on the current study. The esophagus has normal imaging features. There is no axillary lymphadenopathy. Lungs/Pleura: Centrilobular emphsyema noted. 1.7 x 1.5 cm right upper lobe pulmonary nodule persists. Additional scattered small nodules are seen in the right upper lobe including irregular 8 mm right upper lobe nodule on 39/4. 11 mm ground-glass opacity  seen right middle lobe on 71/4. Slight interval decrease in the peripheral subpleural nodular opacity seen previously in the right middle and lower lobe. No overtly suspicious pulmonary nodule or mass in the left lung with decreasing lingular collapse/consolidative disease. No pleural effusion. Upper Abdomen: Layering tiny calcified gallstones evident. Nodular thickening of both adrenal glands  is similar to prior 11 mm right adrenal nodule is probably an adenoma. Musculoskeletal: No worrisome lytic or sclerotic osseous abnormality. IMPRESSION: 1. 1.7 x 1.5 cm right upper lobe pulmonary nodule persists. Imaging features are highly concerning for primary bronchogenic neoplasm. 2. Additional scattered small nodules are seen in the right upper lobe including irregular 8 mm right upper lobe nodule. 3. 11 mm ground-glass opacity right middle lobe. 4. Slight interval decrease in the peripheral subpleural nodular opacity seen previously in the right middle and lower lobe. 5. No overtly suspicious pulmonary nodule or mass in the left lung with decreasing lingular collapse/consolidative disease. 6. Cholelithiasis. 7.  Aortic Atherosclerosis (ICD10-I70.0). Electronically Signed   By: Kennith Center M.D.   On: 10/19/2023 14:01               LOS: 18 days   Elmyra Banwart  [facility]   Pager on [email or web address]. If 7PM-7AM, please contact night-coverage at [email or web address]     10/20/2023, 2:16 PM

## 2023-10-20 NOTE — Anesthesia Procedure Notes (Signed)
Procedure Name: Intubation Date/Time: 10/20/2023 8:11 AM  Performed by: Hezzie Bump, CRNAPre-anesthesia Checklist: Patient identified, Patient being monitored, Timeout performed, Emergency Drugs available and Suction available Patient Re-evaluated:Patient Re-evaluated prior to induction Oxygen Delivery Method: Circle system utilized Preoxygenation: Pre-oxygenation with 100% oxygen Induction Type: IV induction Ventilation: Mask ventilation without difficulty Laryngoscope Size: 3 and McGrath Grade View: Grade I Tube type: Oral Tube size: 8.5 mm Number of attempts: 1 Airway Equipment and Method: Stylet and Video-laryngoscopy Placement Confirmation: ETT inserted through vocal cords under direct vision, positive ETCO2 and breath sounds checked- equal and bilateral Secured at: 21 cm Tube secured with: Tape Dental Injury: Teeth and Oropharynx as per pre-operative assessment  Comments: 8.5ETT fells tight in cords. Bronchoscopist made aware.

## 2023-10-20 NOTE — Progress Notes (Signed)
PT Cancellation Note  Patient Details Name: Michele House MRN: [id number] DOB: [birth year]   Cancelled Treatment:    Reason Eval/Treat Not Completed: Other (comment): Chart Reviewed. Patient supine in bed upon arrival, awakens to therapist voice. Patient had Bronchoscopy this AM. Patient reports fatigue at this time and declines participation in PT services at this time. Agreeable to therapy tomorrow. Will re-attempt at later date/time. MD at bedside when therapist exited room.   Creed Copper Fairly, PT, DPT 10/20/23 1:40 PM

## 2023-10-20 NOTE — Progress Notes (Signed)
OT Cancellation Note  Patient Details Name: Michele House MRN: [id number] DOB: 1956-01-28   Cancelled Treatment:    Reason Eval/Treat Not Completed: Patient declined. PT/OT attempt for co-treat following pt having biopsy/bronchoscopy this morning. Sh was supine in bed upon arrival, awakens to therapist voice. Declines therapy this afternoon d/t fatigue and wishes to try again tomorrow.Will re-attempt at later date/time. MD at bedside and aware when therapist exited room.   Constance Goltz 10/20/2023, 2:43 PM

## 2023-10-20 NOTE — Transfer of Care (Signed)
Immediate Anesthesia Transfer of Care Note  Patient: Michele House  Procedure(s) Performed: VIDEO BRONCHOSCOPY WITH ENDOBRONCHIAL NAVIGATION (Right)  Patient Location: PACU  Anesthesia Type:General  Level of Consciousness: awake, alert , and oriented  Airway & Oxygen Therapy: Patient Spontanous Breathing and Patient connected to face mask oxygen  Post-op Assessment: Report given to RN and Post -op Vital signs reviewed and stable  Post vital signs: Reviewed and stable  Last Vitals:  Vitals Value Taken Time  BP 127/59 10/20/23 0945  Temp    Pulse 97 10/20/23 0946  Resp 15 10/20/23 0946  SpO2 99 % 10/20/23 0946  Vitals shown include unfiled device data.  Last Pain:  Vitals:   10/20/23 0732  TempSrc: Oral  PainSc: 0-No pain      Patients Stated Pain Goal: 0 (10/20/23 0732)  Complications: No notable events documented.

## 2023-10-20 NOTE — Progress Notes (Signed)
PULMONOLOGY         Date: 10/20/2023,   MRN# [id number] Michele House [birth year]     AdmissionWeight: 74.1 kg                 CurrentWeight: 74.1 kg  Referring provider: Dr Nelson Chimes   CHIEF COMPLAINT:   Lung nodule suspicious for carcinoma   HISTORY OF PRESENT ILLNESS   Patient with COPD, dyslipidemia, HTN, thyroid dysfunction, GERD, She has 1.5cm RUL nodule suspicious for primary bronchogenic carcinoma. She needs to have tissue diagnosis. PCCM consultation for biopsy.   10/15/23- patient encepalopathic on examination.  Have ordered ABG, Uric acid, ammonia levels, renal function is normal, electrolytes normal , sugars improved <200.  Neuro and oncology following, s/p LP, s/p paraneoplastic panel, s/p autoimmune panel.  Planning for lung biopsy on 10/29/23 if patient is more stable and safe candidate for general anesthesia  10/17/23- patient awake and alert, she is able to answer questions appropriately.  Encephalopathy has improved markedly. We discussed lung biopsy she has consented to procedure and wishes to proceed as planned.  10/18/23- patient reports leg pain.  She wants to get OOB to walk with PT.  She seems a bit sleepy during interview.  I have dcd claritin.  10/19/23- patient is improved sitting up in bed eating.  She is alert and lucid.  Mild edema on examination. Have ordered CBC and CMP today.  Will diurese today due to 1+ edema on examination.  NPO today at midnight.  Lung biopsy in AM at 730AM 10/20/23- patient evaluated this am , she reports feeling well. She is on 3L/min [place].  She had presurgical evaluation by anesthesia, medical service and neurology yesterday as well as myself.  Group discussion via secure chat with agreement that benefit of this lung biopsy with goal to cure cancer outweighs the risks.  Decision made to proceed as planned by all providers and patient.    PAST MEDICAL HISTORY   Past Medical History:  Diagnosis Date   Acid reflux     COPD (chronic obstructive pulmonary disease) (HCC)    Diabetes mellitus without complication (HCC)    Hyperlipidemia    Hypertension    Thyroid disease      SURGICAL HISTORY   Past Surgical History:  Procedure Laterality Date   appendectomy     APPENDECTOMY     BREAST BIOPSY Right    CORE W/CLIP - NEG   TOTAL VAGINAL HYSTERECTOMY     TUMOR REMOVAL     benign;stomach     FAMILY HISTORY   Family History  Problem Relation Age of Onset   Heart attack Mother    Hypertension Mother    Breast cancer Sister 76   Breast cancer Maternal Aunt        40'S   Breast cancer Maternal Grandmother      SOCIAL HISTORY   Social History   Tobacco Use   Smoking status: Former    Current packs/day: 0.00    Types: Cigarettes    Quit date: 12/15/1985    Years since quitting: 37.8   Smokeless tobacco: Never  Substance Use Topics   Alcohol use: No   Drug use: No     MEDICATIONS    Home Medication:    Current Medication:  Current Facility-Administered Medications:    [MAR Hold] acetaminophen (TYLENOL) tablet 650 mg, 650 mg, Oral, Q6H PRN, Floydene Flock, MD, 650 mg at 10/18/23 0424   [MAR Hold] clopidogrel (PLAVIX)  tablet 75 mg, 75 mg, Oral, Daily, Milon Dikes, MD, 75 mg at 10/19/23 0943   [MAR Hold] dextromethorphan-guaiFENesin (MUCINEX DM) 30-600 MG per 12 hr tablet 1 tablet, 1 tablet, Oral, BID PRN, Floydene Flock, MD, 1 tablet at 10/18/23 0832   [MAR Hold] enoxaparin (LOVENOX) injection 40 mg, 40 mg, Subcutaneous, Daily, Renae Gloss, Richard, MD, 40 mg at 10/19/23 0941   [MAR Hold] furosemide (LASIX) injection 40 mg, 40 mg, Intravenous, Daily, Vida Rigger, MD, 40 mg at 10/19/23 1256   [MAR Hold] insulin aspart (novoLOG) injection 0-15 Units, 0-15 Units, Subcutaneous, TID WC, Wieting, Richard, MD, 5 Units at 10/19/23 1726   [MAR Hold] insulin aspart (novoLOG) injection 0-5 Units, 0-5 Units, Subcutaneous, QHS, Wieting, Richard, MD, 2 Units at 10/19/23 2126   [facility] Hold]  insulin glargine-yfgn (SEMGLEE) injection 15 Units, 15 Units, Subcutaneous, QHS, Amin, Tilman Neat, MD, 15 Units at 10/19/23 2329   [MAR Hold] ipratropium-albuterol (DUONEB) 0.5-2.5 (3) MG/3ML nebulizer solution 3 mL, 3 mL, Nebulization, Q4H PRN, Floydene Flock, MD, 3 mL at 10/17/23 0917   [MAR Hold] levothyroxine (SYNTHROID) tablet 100 mcg, 100 mcg, Oral, Q0600, Floydene Flock, MD, 100 mcg at 10/19/23 0557   [MAR Hold] methocarbamol (ROBAXIN) tablet 500 mg, 500 mg, Oral, Q8H PRN, Arnetha Courser, MD, 500 mg at 10/19/23 2126   [facility] Hold] ondansetron (ZOFRAN) injection 4 mg, 4 mg, Intravenous, Q6H PRN, Lurene Shadow, MD, 4 mg at 10/19/23 1245   [MAR Hold] Oral care mouth rinse, 15 mL, Mouth Rinse, PRN, Alford Highland, MD   [facility] Hold] QUEtiapine (SEROQUEL) tablet 12.5 mg, 12.5 mg, Oral, QHS PRN, Alford Highland, MD, 12.5 mg at 10/19/23 0040   [MAR Hold] rOPINIRole (REQUIP XL) 24 hr tablet 4 mg, 4 mg, Oral, QHS, Amin, Tilman Neat, MD, 4 mg at 10/19/23 2329   [facility] Hold] sertraline (ZOLOFT) tablet 25 mg, 25 mg, Oral, Daily, Renae Gloss, Richard, MD, 25 mg at 10/19/23 0942   [MAR Hold] traMADol (ULTRAM) tablet 50 mg, 50 mg, Oral, Q6H PRN, Arnetha Courser, MD, 50 mg at 10/19/23 2126    ALLERGIES   Penicillins, Aspirin, and Sulfa antibiotics     REVIEW OF SYSTEMS    Review of Systems:  Gen:  Denies  fever, sweats, chills weigh loss  HEENT: Denies blurred vision, double vision, ear pain, eye pain, hearing loss, nose bleeds, sore throat Cardiac:  No dizziness, chest pain or heaviness, chest tightness,edema Resp:   reports dyspnea chronically  Gi: Denies swallowing difficulty, stomach pain, nausea or vomiting, diarrhea, constipation, bowel incontinence Gu:  Denies bladder incontinence, burning urine Ext:   Denies Joint pain, stiffness or swelling Skin: Denies  skin rash, easy bruising or bleeding or hives Endoc:  Denies polyuria, polydipsia , polyphagia or weight change Psych:   Denies depression,  insomnia or hallucinations   Other:  All other systems negative   VS: BP 132/67   Pulse 90   Temp (!) 97.4 F (36.3 C) (Oral)   Resp 16   Ht 5' 0.95" (1.548 m)   Wt 74.1 kg   LMP 12/24/1998 Comment: prior to her hysterectomy  SpO2 97%   BMI 30.92 kg/m      PHYSICAL EXAM    GENERAL:NAD, no fevers, chills, no weakness no fatigue HEAD: Normocephalic, atraumatic.  EYES: Pupils equal, round, reactive to light. Extraocular muscles intact. No scleral icterus.  MOUTH: Moist mucosal membrane. Dentition intact. No abscess noted.  EAR, NOSE, THROAT: Clear without exudates. No external lesions.  NECK: Supple. No thyromegaly. No nodules.  No JVD.  PULMONARY: decreased breath sounds with mild rhonchi worse at bases bilaterally.  CARDIOVASCULAR: S1 and S2. Regular rate and rhythm. No murmurs, rubs, or gallops. No edema. Pedal pulses 2+ bilaterally.  GASTROINTESTINAL: Soft, nontender, nondistended. No masses. Positive bowel sounds. No hepatosplenomegaly.  MUSCULOSKELETAL: No swelling, clubbing, or edema. Range of motion full in all extremities.  NEUROLOGIC: Cranial nerves II through XII are intact. No gross focal neurological deficits. Appears tired with excessive sleepiness  SKIN: No ulceration, lesions, rashes, or cyanosis. Skin warm and dry. Turgor intact.  PSYCHIATRIC: Mood, affect within normal limits. The patient is awake, alert and oriented x 3. Insight, judgment intact.       IMAGING   CT chest -  Enlarging spiculated nodule in the inferior right upper lobe now 1.6 cm compared to 1.3 cm previously.   ASSESSMENT/PLAN   Lung nodule suspicious for primary bronchogenic carcinoma   1.5cm - RUL   -patient would require biopsy - will order bronchoscopy     -Reviewed risks/complications and benefits with patient, risks include infection, pneumothorax/pneumomediastinum which may require chest tube placement as well as overnight/prolonged hospitalization and possible mechanical  ventilation. Other risks include bleeding and very rarely death.  Patient understands risks and wishes to proceed.  Additional questions were answered, and patient is aware that post procedure patient will be going home with family and may experience cough with possible clots on expectoration as well as phlegm which may last few days as well as hoarseness of voice post intubation and mechanical ventilation.      Encephalopathy- RESOLVED  S/p neuro and med/onc eval  - ordered ammonia, uric acid, ABG today -reviewed    COPD with bronchitic phenotype   - patient reports tightness in chest today   - she reports panic attack when resp status deteriorates.  She is on 3L/min O2 at this time.    -She uses albuterol and Breo at home   Thank you for allowing me to participate in the care of this patient.   Patient/Family are satisfied with care plan and all questions have been answered.    Provider disclosure: Patient with at least one acute or chronic illness or injury that poses a threat to life or bodily function and is being managed actively during this encounter.  All of the below services have been performed independently by signing provider:  review of prior documentation from internal and or external health records.  Review of previous and current lab results.  Interview and comprehensive assessment during patient visit today. Review of current and previous chest radiographs/CT scans. Discussion of management and test interpretation with health care team and patient/family.   This document was prepared using Dragon voice recognition software and may include unintentional dictation errors.     Vida Rigger, M.D.  Division of Pulmonary & Critical Care Medicine

## 2023-10-21 ENCOUNTER — Encounter: Payer: Self-pay | Admitting: Pulmonary Disease

## 2023-10-21 DIAGNOSIS — G9341 Metabolic encephalopathy: Secondary | ICD-10-CM | POA: Diagnosis not present

## 2023-10-21 LAB — GLUCOSE, CAPILLARY
Glucose-Capillary: 210 mg/dL — ABNORMAL HIGH (ref 70–99)
Glucose-Capillary: 198 mg/dL — ABNORMAL HIGH (ref 70–99)
Glucose-Capillary: 275 mg/dL — ABNORMAL HIGH (ref 70–99)
Glucose-Capillary: 123 mg/dL — ABNORMAL HIGH (ref 70–99)

## 2023-10-21 NOTE — Plan of Care (Signed)
Problem: Education: Goal: Ability to describe self-care measures that may prevent or decrease complications (Diabetes Survival Skills Education) will improve Outcome: Progressing   Problem: Coping: Goal: Ability to adjust to condition or change in health will improve Outcome: Progressing   Problem: Fluid Volume: Goal: Ability to maintain a balanced intake and output will improve Outcome: Progressing   Problem: Health Behavior/Discharge Planning: Goal: Ability to identify and utilize available resources and services will improve Outcome: Progressing Goal: Ability to manage health-related needs will improve Outcome: Progressing   Problem: Metabolic: Goal: Ability to maintain appropriate glucose levels will improve Outcome: Progressing   Problem: Nutritional: Goal: Maintenance of adequate nutrition will improve Outcome: Progressing Goal: Progress toward achieving an optimal weight will improve Outcome: Progressing   Problem: Skin Integrity: Goal: Risk for impaired skin integrity will decrease Outcome: Progressing   Problem: Tissue Perfusion: Goal: Adequacy of tissue perfusion will improve Outcome: Progressing   Problem: Education: Goal: Knowledge of General Education information will improve Description: Including pain rating scale, medication(s)/side effects and non-pharmacologic comfort measures Outcome: Progressing   Problem: Health Behavior/Discharge Planning: Goal: Ability to manage health-related needs will improve Outcome: Progressing   Problem: Clinical Measurements: Goal: Ability to maintain clinical measurements within normal limits will improve Outcome: Progressing Goal: Will remain free from infection Outcome: Progressing Goal: Diagnostic test results will improve Outcome: Progressing Goal: Respiratory complications will improve Outcome: Progressing Goal: Cardiovascular complication will be avoided Outcome: Progressing   Problem: Activity: Goal:  Risk for activity intolerance will decrease Outcome: Progressing   Problem: Nutrition: Goal: Adequate nutrition will be maintained Outcome: Progressing   Problem: Coping: Goal: Level of anxiety will decrease Outcome: Progressing   Problem: Elimination: Goal: Will not experience complications related to bowel motility Outcome: Progressing Goal: Will not experience complications related to urinary retention Outcome: Progressing   Problem: Pain Management: Goal: General experience of comfort will improve Outcome: Progressing   Problem: Safety: Goal: Ability to remain free from injury will improve Outcome: Progressing   Problem: Skin Integrity: Goal: Risk for impaired skin integrity will decrease Outcome: Progressing   Problem: Education: Goal: Knowledge of disease or condition will improve Outcome: Progressing Goal: Knowledge of the prescribed therapeutic regimen will improve Outcome: Progressing Goal: Individualized Educational Video(s) Outcome: Progressing   Problem: Activity: Goal: Ability to tolerate increased activity will improve Outcome: Progressing Goal: Will verbalize the importance of balancing activity with adequate rest periods Outcome: Progressing   Problem: Respiratory: Goal: Ability to maintain a clear airway will improve Outcome: Progressing Goal: Levels of oxygenation will improve Outcome: Progressing Goal: Ability to maintain adequate ventilation will improve Outcome: Progressing   Problem: Activity: Goal: Ability to tolerate increased activity will improve Outcome: Progressing   Problem: Clinical Measurements: Goal: Ability to maintain a body temperature in the normal range will improve Outcome: Progressing   Problem: Respiratory: Goal: Ability to maintain adequate ventilation will improve Outcome: Progressing Goal: Ability to maintain a clear airway will improve Outcome: Progressing   Problem: Education: Goal: Knowledge of disease or  condition will improve Outcome: Progressing Goal: Knowledge of secondary prevention will improve (MUST DOCUMENT ALL) Outcome: Progressing Goal: Knowledge of patient specific risk factors will improve Loraine Leriche N/A or DELETE if not current risk factor) Outcome: Progressing   Problem: Ischemic Stroke/TIA Tissue Perfusion: Goal: Complications of ischemic stroke/TIA will be minimized Outcome: Progressing   Problem: Coping: Goal: Will verbalize positive feelings about self Outcome: Progressing Goal: Will identify appropriate support needs Outcome: Progressing   Problem: Health Behavior/Discharge Planning: Goal: Ability to manage  health-related needs will improve Outcome: Progressing Goal: Goals will be collaboratively established with patient/family Outcome: Progressing   Problem: Self-Care: Goal: Ability to participate in self-care as condition permits will improve Outcome: Progressing Goal: Verbalization of feelings and concerns over difficulty with self-care will improve Outcome: Progressing Goal: Ability to communicate needs accurately will improve Outcome: Progressing   Problem: Nutrition: Goal: Risk of aspiration will decrease Outcome: Progressing Goal: Dietary intake will improve Outcome: Progressing

## 2023-10-21 NOTE — Progress Notes (Signed)
Physical Therapy Treatment Patient Details Name: Michele House MRN: [id number] DOB: 12/21/1955 Today's Date: 10/21/2023   History of Present Illness Pt is a 67 y/o F admitted on 10/02/23 after presenting with acute on chronic respiratory failure with hypoxia, COPD exacerbation, PNA, UTI, & encephalopathy.  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 Mild associated  petechial hemorrhage".  PMH: COPD, chronic respiratory failure on 2L,HTN, DM2, HFpEF.    PT Comments  Pt seen for PT/OT co-session; pt agreeable to therapy with a little encouragement (pt reporting not sleeping well last night and was tired).  Improved R hand grip strength noted from last session.  Pt unable to wiggle her toes and flex her hip (R LE) but when therapist brought R LE into hip/knee flexion, pt able to push to extend R LE (improved muscle strength from last session).  Pt demonstrating forward flexed posture in sitting requiring cueing and assist for upright posture (pt also with R, posterior, and sometimes anterior lean requiring max assist for sitting balance plus 2nd assist for safety).  Practiced sitting weight shifting to L with min to mod assist x2.  Will continue to focus on strengthening, sitting balance, and progressive functional mobility during hospitalization.    If plan is discharge home, recommend the following: Assistance with cooking/housework;Supervision due to cognitive status;Direct supervision/assist for financial management;Help with stairs or ramp for entrance;Assist for transportation;Direct supervision/assist for medications management;Assistance with feeding;Two people to help with walking and/or transfers;A lot of help with bathing/dressing/bathroom   Can travel by private vehicle     No  Equipment Recommendations  BSC/3in1;Wheelchair (measurements PT);Wheelchair cushion (measurements  PT);Hoyer lift;Hospital bed    Recommendations for Other Services       Precautions / Restrictions Precautions Precautions: Fall Restrictions Weight Bearing Restrictions Per Provider Order: No     Mobility  Bed Mobility Overal bed mobility: Needs Assistance Bed Mobility: Rolling Rolling: Max assist   Supine to sit: Max assist, +2 for physical assistance Sit to supine: Max assist, +2 for physical assistance   General bed mobility comments: assist for trunk and B LE's; vc's for technique    Transfers                   General transfer comment: Deferred d/t assist required for sitting balance; not appropriate at this time    Ambulation/Gait                   Stairs             Wheelchair Mobility     Tilt Bed    Modified Rankin (Stroke Patients Only)       Balance Overall balance assessment: Needs assistance Sitting-balance support: Single extremity supported, Feet unsupported Sitting balance-Leahy Scale: Zero Sitting balance - Comments: Max assist for static sitting at EOB (tending to lean to R/backwards/occasionally forwards); 2nd assist for safety; cues to utilize L UE on bed for balance                                    Cognition Arousal: Alert Behavior During Therapy: Flat affect Overall Cognitive Status: Impaired/Different from baseline                                 General Comments: Oriented to  at least person and general situation (had a stroke); able to identify her son when he arrived end of session        Exercises Other Exercises Other Exercises: Weightshifting in sitting to L side x10 reps (min to mod assist x2)    General Comments  Nursing cleared pt for participation in physical therapy.      Pertinent Vitals/Pain Pain Assessment Pain Assessment: Faces Faces Pain Scale: No hurt Pain Intervention(s): Limited activity within patient's tolerance, Monitored during session,  Repositioned HR 91-103 bpm and SpO2 sats 91% or greater on 2 L O2 via nasal cannula during sessions activities.    Home Living                          Prior Function            PT Goals (current goals can now be found in the care plan section) Acute Rehab PT Goals Patient Stated Goal: none stated PT Goal Formulation: With patient Time For Goal Achievement: 11/01/23 Potential to Achieve Goals: Fair Progress towards PT goals: Progressing toward goals    Frequency    Min 1X/week      PT Plan      Co-evaluation PT/OT/SLP Co-Evaluation/Treatment: Yes Reason for Co-Treatment: Complexity of the patient's impairments (multi-system involvement);Necessary to address cognition/behavior during functional activity;For patient/therapist safety;To address functional/ADL transfers PT goals addressed during session: Mobility/safety with mobility;Balance OT goals addressed during session: ADL's and self-care      AM-PAC PT "6 Clicks" Mobility   Outcome Measure  Help needed turning from your back to your side while in a flat bed without using bedrails?: A Lot Help needed moving from lying on your back to sitting on the side of a flat bed without using bedrails?: Total Help needed moving to and from a bed to a chair (including a wheelchair)?: Total Help needed standing up from a chair using your arms (e.g., wheelchair or bedside chair)?: Total Help needed to walk in hospital room?: Total Help needed climbing 3-5 steps with a railing? : Total 6 Click Score: 7    End of Session Equipment Utilized During Treatment: Oxygen (2 L via nasal cannula) Activity Tolerance: Patient limited by fatigue;Patient tolerated treatment well Patient left: in bed;with call bell/phone within reach;with bed alarm set;with family/visitor present;Other (comment) (Pillow supporting R UE and R LE (R heel floating); pt positioned to improve comfort) Nurse Communication: Mobility status;Precautions PT  Visit Diagnosis: Unsteadiness on feet (R26.81);Other abnormalities of gait and mobility (R26.89);Muscle weakness (generalized) (M62.81);Difficulty in walking, not elsewhere classified (R26.2)     Time: 6045-4098 PT Time Calculation (min) (ACUTE ONLY): 33 min  Charges:    $Therapeutic Activity: 8-22 mins PT General Charges $$ ACUTE PT VISIT: 1 Visit                     Hendricks Limes, PT 10/21/23, 12:40 PM

## 2023-10-21 NOTE — Progress Notes (Signed)
Progress Note    Michele House  HQI:[id number] DOB: January 07, 1956  DOA: 10/02/2023 PCP: [facility]      Brief Narrative:    Medical records reviewed and are as summarized below:  Michele House is a 67 y.o. female with medical history significant of COPD, chronic respiratory failure on 2 L, hypertension, type 2 diabetes, HFpEF, recent discharge from the hospital 24th November 2024 after hospitalization for COPD exacerbation complicated by respiratory failure.  She presented to the hospital again on 10/02/2023 with chest pain, malaise, cough, shortness of breath, abdominal pain and dysuria.  She was admitted to the hospital for COPD exacerbation, acute on chronic hypoxemic respiratory failure, UTI and pneumonia.  She became more confused in the hospital and MRI brain revealed acute stroke.        12/1: Vital stable, respiratory viral panel negative, procalcitonin negative, preliminary blood cultures negative.  Urine cultures are ordered as add-on, ammonia levels mildly elevated at 39, strep pneumo negative, CBG elevated at 244, history of enlarging pulmonary nodule with recommendations for PET/CT during most recent admission which has not been done yet, if video bronchoscopy was scheduled for 10/13/2023 as outpatient.  Unable to take care of himself and son cannot provide the required care as she required 24-hour supervision due to worsening dementia.  PT is recommending SNF.  12/2: Hemodynamically stable, now on baseline oxygen of 2 L.  Needs SNF placement. Urine cultures pending.  12/3: Blood pressure started trending up, restarting home Zestoretic.  Urine cultures with strep agalactiae, penicillin allergy noted, will continue with ceftriaxone for now.  Also started on Remeron for concern of worsening depression and unable to sleep at night  12/4.  Some very concerned about patient's mental status not being seen.  Patient receiving antibiotics already  and should have improved by now.  Will hold Lipitor.  Restart low-dose Zoloft.  Discontinue Remeron and do Seroquel at night. 12/5.  Patient has some blurry vision and weakness and pain.  MRI of the brain negative for acute intracranial abnormality. 12/6.  Patient stated she slept well.  Patient able to lift up her arms up off the bed today.  Patient thought she heard her son's voice outside the room. 12/7.  Patient sitting up in chair when I saw her.  Was able to feed herself breakfast.  Felt okay.  Did not offer any complaints. 12/8.  Patient more confused today than yesterday.  Able to follow some simple commands but not as good of a conversation today as yesterday. 12/9.  Patient still confused today.  Does not follow simple commands today. 12/10.  Patient more talkative today but was looking up at the ceiling when she was talking with me.  She does not move her extremities to command but does move them on her own.  Physical therapy and Occupational Therapy did not see her move her right arm. 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  Mild associated petechial hemorrhage.  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 12/12: Patient with new stroke, CTA head and neck with no large vessel occlusion.  Patient had a recent echocardiogram which was normal. CSF cultures remain negative.  Lipid panel with increased triglyceride at 347, HDL 29 and LDL of 38.  CBC with mild thrombocytopenia at 130, BMP mostly unremarkable.  Patient with significant right-sided weakness. Concern of paraneoplastic versus hypercoagulability secondary to malignancy.  Patient missed her appointment for bronchoscopy and outpatient appointment for PET scan due to recurrent hospitalizations. Involving pulmonary and oncology for their input. Send  out encephalitis panel still pending.  12/13: Patient continued to have waxing and waning mental status, unable to move right upper and lower extremities.  Having some hallucination.  Pulmonary is planning lung biopsy on 12/27 if she remains stable.  Rapidly progressive dementia and now with underlying stroke, question of paraneoplastic encephalitis and hypercoagulability secondary to underlying undiagnosed malignancy. Patient will also get benefit from neuropsych evaluation as outpatient.  12/14: Patient with some improved mentation today.  Trying to squeeze with right hand but still no movements of right lower extremity.  12/15: Patient with much improved mentation.  Having some flickering of movements on right upper and lower extremity today, left extremities weaker but seems improving.   12/16: Mental status seems stable, flaccid right upper and lower extremities.  Complaining of muscle spasms so Robaxin was ordered.  Pulmonary is trying to do bronchoscopy and biopsy on Wednesday.      Assessment/Plan:   Principal Problem:   Acute metabolic encephalopathy Active Problems:   CVA (cerebral vascular accident) (HCC)   Uncontrolled type 2 diabetes mellitus with hypoglycemia, without long-term current use of insulin (HCC)   Hypotension   Acute on chronic respiratory failure with hypoxia (HCC)   Hypernatremia   COPD exacerbation (HCC)   Lung nodule   Pneumonia   UTI (urinary tract infection)   (HFpEF) heart failure with preserved ejection fraction (HCC)   Essential hypertension   Type 2 diabetes mellitus with obesity (HCC)    Body mass index is 30.92 kg/m.   Acute metabolic encephalopathy: Improved. She completed 5 days of high-dose several IV Solu-Medrol on 10/12/2023 Differential diagnosis include autoimmune encephalitis, leptomeningeal spread from spiculated nodule    Acute stroke with right hemiplegia: Continue Plavix.  If she is found to have underlying malignancy and  hypercoagulable state then she may need anticoagulation instead of Plavix per neurologist. 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   Type II DM with hyperglycemia: Continue Semglee 15 units nightly and NovoLog as needed   Hypotension: Improved   Pneumonia: Completed 5 days of IV ceftriaxone and azithromycin Acute UTI: Urine culture showed strep agalactiae.  Completed IV ceftriaxone.   COPD exacerbation: Resolved.  Continue bronchodilators Acute on chronic hypoxemic respiratory failure: Continue 2 L/min oxygen via nasal cannula   1.3 cm right upper lung spiculated nodule: S/p bronchoscopy on 10/20/2023.  Follow-up pathology report.  Patient had missed her appointments for follow-up in the past.      Diet Order             DIET DYS 3 Room service appropriate? Yes; Fluid consistency: Thin  Diet effective now                            Consultants: Pulmonologist Hematologist  Procedures: Lumbar puncture on 10/08/2023 Bronchoscopy on 10/20/2023    Medications:    clopidogrel  75 mg Oral Daily   enoxaparin (LOVENOX) injection  40 mg Subcutaneous Daily   furosemide  40 mg Intravenous Daily   insulin aspart  0-15 Units Subcutaneous TID WC   insulin aspart  0-5  Units Subcutaneous QHS   insulin glargine-yfgn  15 Units Subcutaneous QHS   levothyroxine  100 mcg Oral Q0600   rOPINIRole  4 mg Oral QHS   sertraline  25 mg Oral Daily   Continuous Infusions:   Anti-infectives (From admission, onward)    Start     Dose/Rate Route Frequency Ordered Stop   10/04/23 1000  azithromycin (ZITHROMAX) tablet 500 mg        500 mg Oral Daily 10/03/23 0751 10/06/23 0856   10/03/23 0600  cefTRIAXone (ROCEPHIN) 2 g in sodium chloride 0.9 % 100 mL IVPB  Status:  Discontinued         2 g 200 mL/hr over 30 Minutes Intravenous Every 24 hours 10/02/23 0740 10/07/23 0854   10/02/23 0745  cefTRIAXone (ROCEPHIN) 2 g in sodium chloride 0.9 % 100 mL IVPB  Status:  Discontinued        2 g 200 mL/hr over 30 Minutes Intravenous Every 24 hours 10/02/23 0738 10/02/23 0740   10/02/23 0745  azithromycin (ZITHROMAX) 500 mg in sodium chloride 0.9 % 250 mL IVPB        500 mg 250 mL/hr over 60 Minutes Intravenous Every 24 hours 10/02/23 0738 10/03/23 1236   10/02/23 0630  cefTRIAXone (ROCEPHIN) 2 g in sodium chloride 0.9 % 100 mL IVPB        2 g 200 mL/hr over 30 Minutes Intravenous  Once 10/02/23 0617 10/02/23 0714              Family Communication/Anticipated D/C date and plan/Code Status   DVT prophylaxis: enoxaparin (LOVENOX) injection 40 mg Start: 10/12/23 1115 SCDs Start: 10/02/23 1610     Code Status: Full Code  Family Communication: None Disposition Plan: Plan to discharge to SNF    Status is: Inpatient Remains inpatient appropriate because: Awaiting placement to SNF       Subjective:   Interval events noted.  She said she had trouble breathing last night but she feels better now.  No chest pain or shortness of breath at the moment.  Objective:    Vitals:   10/21/23 0147 10/21/23 0430 10/21/23 0741 10/21/23 1335  BP:  128/67 (!) 138/57 130/68  Pulse:  86 88 97  Resp:  18 18 18   Temp:  97.9 F (36.6 C) 97.8 F (36.6 C) 98.1 F (36.7 C)  TempSrc:  Oral Oral Oral  SpO2: 97% 97% 95% 97%  Weight:      Height:       No data found.   Intake/Output Summary (Last 24 hours) at 10/21/2023 1450 Last data filed at 10/21/2023 0500 Gross per 24 hour  Intake 900 ml  Output 800 ml  Net 100 ml   Filed Weights   10/05/23 0912 10/20/23 0732  Weight: 74.1 kg 74.1 kg    Exam:  GEN: NAD SKIN: Warm and dry EYES: No pallor or icterus ENT: MMM CV: RRR PULM: CTA B ABD: soft, ND, NT, +BS CNS: AAO x 3, right hemiplegia EXT: No edema or  tenderness       Data Reviewed:   I have personally reviewed following labs and imaging studies:  Labs: Labs show the following:   Basic Metabolic Panel: Recent Labs  Lab 10/19/23 1252  NA 135  K 4.5  CL 93*  CO2 33*  GLUCOSE 201*  BUN 13  CREATININE 0.81  CALCIUM 8.9   GFR Estimated Creatinine Clearance: 60.3 mL/min (by C-G formula based on SCr of 0.81 mg/dL). Liver Function  Tests: Recent Labs  Lab 10/19/23 1252  AST 18  ALT 18  ALKPHOS 70  BILITOT <0.2  PROT 5.4*  ALBUMIN 3.1*   No results for input(s): "LIPASE", "AMYLASE" in the last 168 hours. Recent Labs  Lab 10/15/23 1434  AMMONIA 37*   Coagulation profile No results for input(s): "INR", "PROTIME" in the last 168 hours.  CBC: Recent Labs  Lab 10/19/23 1252  WBC 6.7  NEUTROABS 5.3  HGB 9.6*  HCT 29.5*  MCV 90.8  PLT 188   Cardiac Enzymes: No results for input(s): "CKTOTAL", "CKMB", "CKMBINDEX", "TROPONINI" in the last 168 hours. BNP (last 3 results) No results for input(s): "PROBNP" in the last 8760 hours. CBG: Recent Labs  Lab 10/20/23 1050 10/20/23 1601 10/20/23 2120 10/21/23 0800 10/21/23 1202  GLUCAP 195* 365* 324* 210* 198*   D-Dimer: No results for input(s): "DDIMER" in the last 72 hours. Hgb A1c: No results for input(s): "HGBA1C" in the last 72 hours. Lipid Profile: No results for input(s): "CHOL", "HDL", "LDLCALC", "TRIG", "CHOLHDL", "LDLDIRECT" in the last 72 hours. Thyroid function studies: No results for input(s): "TSH", "T4TOTAL", "T3FREE", "THYROIDAB" in the last 72 hours.  Invalid input(s): "FREET3" Anemia work up: No results for input(s): "VITAMINB12", "FOLATE", "FERRITIN", "TIBC", "IRON", "RETICCTPCT" in the last 72 hours. Sepsis Labs: Recent Labs  Lab 10/19/23 1252  WBC 6.7    Microbiology Recent Results (from the past 240 hours)  Culture, BAL-quantitative w Gram Stain     Status: None (Preliminary result)   Collection Time: 10/20/23  9:49 AM    Specimen: Bronchoalveolar Lavage; Respiratory  Result Value Ref Range Status   Specimen Description   Final    BRONCHIAL ALVEOLAR LAVAGE Performed at [facility], [address], [place], [place] [address]    Special Requests   Final    NONE Performed at [facility], [address] [place], [place] [address]    Gram Stain PENDING  Incomplete   Culture   Final    NO GROWTH < 24 HOURS Performed at [facility], [address] [place], [place] [address]    Report Status PENDING  Incomplete    Procedures and diagnostic studies:  DG Chest Port 1 View Result Date: 10/20/2023 CLINICAL DATA:  Status post bronchoscopy. EXAM: PORTABLE CHEST 1 VIEW COMPARISON:  October 15, 2023.  October 19, 2023. FINDINGS: No definite pneumothorax is noted status post bronchoscopy. Right perihilar nodule is noted. Left lung is unremarkable. IMPRESSION: No definite pneumothorax status post bronchoscopy. Electronically Signed   By: Lupita Raider M.D.   On: 10/20/2023 13:52   DG C-Arm 1-60 Min-No Report Result Date: 10/20/2023 Fluoroscopy was utilized by the requesting physician.  No radiographic interpretation.               LOS: 19 days   Michele House  [employer] loss adjuster on [email or web address]. If 7PM-7AM, please contact night-coverage at [email or web address]     10/21/2023, 2:50 PM

## 2023-10-21 NOTE — Progress Notes (Signed)
Occupational Therapy Treatment Patient Details Name: Michele House MRN: [id number] DOB: 1956-01-05 Today's Date: 10/21/2023   History of present illness Pt is a 67 y/o F admitted on 10/02/23 after presenting with acute on chronic respiratory failure with hypoxia, COPD exacerbation, PNA, UTI, & encephalopathy.  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 Mild associated  petechial hemorrhage".  PMH: COPD, chronic respiratory failure on 2L,HTN, DM2, HFpEF.   OT comments  Pt is supine in bed on arrival. Alert and agreeable to PT/OT co-tx session, however emotional at first d/t he R sided deficits. She denies pain. RUE sensation intact, however only trace contractions noted throughout excluding R grip strength/digit ROM. 3/5 R grip strength. PROM provided a few reps to shoulder, elbow and wrist. Max A x2 for all bed mobility and repositioning in bed. Pt with noted R lateral lean as well as posterior and anterior at times while sitting EOB and minimal control of neck/head. Min/Mod A x2 for weight shifting activity to L elbow and back to midline. OT seated on R side for balance assist with PT present for safety and balance activities. R shoulder subluxation/misalignment noted anteriorly. Attempts for better posture made with little improvement. Pt is willing to participate, but needs increased time and cueing for following directions. Pt returned to bed with all needs in place and will cont to require skilled acute OT services to maximize his safety and IND to return to PLOF.       If plan is discharge home, recommend the following:  Assistance with cooking/housework;Assist for transportation;Help with stairs or ramp for entrance;Direct supervision/assist for medications management;Supervision due to cognitive status;Direct supervision/assist for financial management;Two people to help with walking  and/or transfers;Two people to help with bathing/dressing/bathroom   Equipment Recommendations  Other (comment) (defer to next venue)    Recommendations for Other Services      Precautions / Restrictions Precautions Precautions: Fall Restrictions Weight Bearing Restrictions Per Provider Order: No       Mobility Bed Mobility Overal bed mobility: Needs Assistance Bed Mobility: Rolling, Supine to Sit, Sit to Supine Rolling: Max assist   Supine to sit: Max assist, +2 for physical assistance Sit to supine: Max assist, +2 for physical assistance   General bed mobility comments: trunk and BLE assist with cueing needed for all bed mobility and repositioning; cueing provided with litte follow through    Transfers                   General transfer comment: Deferred d/t assist required for sitting balance; not appropriate at this time     Balance Overall balance assessment: Needs assistance Sitting-balance support: Single extremity supported, Feet unsupported Sitting balance-Leahy Scale: Zero Sitting balance - Comments: Max assist for static sitting at EOB (tending to lean to R/backwards/occasionally forwards); 2nd assist for safety; cues to utilize L UE on bed for balance while performing weight shifting onto L elbow       Standing balance comment: NT d/t high level of assist for seated balance                           ADL either performed or assessed with clinical judgement   ADL Overall ADL's : Needs assistance/impaired                     Lower Body Dressing: Bed level;Maximal assistance;Total assistance  Lower Body Dressing Details (indicate cue type and reason): requires assist at bed level to pull socks up as they come off her feet from moving in bed                    Extremity/Trunk Assessment Upper Extremity Assessment Upper Extremity Assessment: Generalized weakness;Right hand dominant;RUE deficits/detail RUE Deficits / Details:  grip 3/5 in R hand with digit ROM; no AROM to wrist, elbow or shoulder; trace contractions noted   Lower Extremity Assessment Lower Extremity Assessment: Generalized weakness        Vision       Perception     Praxis      Cognition Arousal: Alert Behavior During Therapy: Flat affect Overall Cognitive Status: Impaired/Different from baseline                                 General Comments: Oriented to at least person and general situation (had a stroke); able to identify her son when he arrived end of session        Exercises General Exercises - Upper Extremity Shoulder Flexion: PROM, 5 reps, Right, Supine Elbow Extension: PROM, 5 reps, Right, Supine Wrist Flexion: PROM, 5 reps, Right, Supine Wrist Extension: PROM, 5 reps, Right, Supine    Shoulder Instructions       General Comments      Pertinent Vitals/ Pain       Pain Assessment Pain Assessment: Faces Faces Pain Scale: No hurt Pain Intervention(s): Monitored during session, Limited activity within patient's tolerance  Home Living                                          Prior Functioning/Environment              Frequency  Min 1X/week        Progress Toward Goals  OT Goals(current goals can now be found in the care plan section)  Progress towards OT goals: Progressing toward goals  Acute Rehab OT Goals Patient Stated Goal: improve R side use OT Goal Formulation: With patient Time For Goal Achievement: 10/26/23 Potential to Achieve Goals: Good  Plan      Co-evaluation    PT/OT/SLP Co-Evaluation/Treatment: Yes Reason for Co-Treatment: Complexity of the patient's impairments (multi-system involvement);Necessary to address cognition/behavior during functional activity;For patient/therapist safety;To address functional/ADL transfers PT goals addressed during session: Mobility/safety with mobility;Balance OT goals addressed during session: ADL's and  self-care      AM-PAC OT "6 Clicks" Daily Activity     Outcome Measure   Help from another person eating meals?: A Little Help from another person taking care of personal grooming?: A Lot Help from another person toileting, which includes using toliet, bedpan, or urinal?: Total Help from another person bathing (including washing, rinsing, drying)?: Total Help from another person to put on and taking off regular upper body clothing?: A Lot Help from another person to put on and taking off regular lower body clothing?: Total 6 Click Score: 10    End of Session Equipment Utilized During Treatment: Oxygen  OT Visit Diagnosis: Other abnormalities of gait and mobility (R26.89);Muscle weakness (generalized) (M62.81);Hemiplegia and hemiparesis Hemiplegia - Right/Left: Right Hemiplegia - dominant/non-dominant: Dominant   Activity Tolerance Patient tolerated treatment well   Patient Left in bed;with call bell/phone within reach;with bed alarm  set;with family/visitor present   Nurse Communication          Time: [phone] OT Time Calculation (min): 33 min  Charges: OT General Charges $OT Visit: 1 Visit OT Treatments $Therapeutic Activity: 8-22 mins  Traven Davids, OTR/L  10/21/23, 1:30 PM   Messiah Ahr E Kenyon Eshleman 10/21/2023, 1:26 PM

## 2023-10-21 NOTE — Progress Notes (Signed)
SLP Cancellation Note  Patient Details Name: Michele House MRN: [id number] DOB: [birth year]   Cancelled treatment:       Reason Eval/Treat Not Completed: Fatigue/lethargy limiting ability to participate (Pt requesting to sleep. Will continue effors as appropriate.)  [name], M.S., CCC-SLP Speech-Language Pathologist [facility] [phone] Michele House)   Michele House 10/21/2023, 9:09 AM

## 2023-10-21 NOTE — TOC Progression Note (Signed)
Transition of Care [facility]) - Progression Note    Patient Details  Name: DELL DEBS MRN: [id number] Date of Birth: 1956-02-22  Transition of Care [facility]) CM/SW Contact  Allena Katz, LCSW Phone Number: 10/21/2023, 11:14 AM  Clinical Narrative:   Met patient at bedside and shared bed offers. She would like [place] to start auth at [facility]. CSW to start auth once therapy notes are in,    Expected Discharge Plan: Skilled Nursing Facility Barriers to Discharge: Continued Medical Work up  Expected Discharge Plan and Services                                               Social Determinants of Health (SDOH) Interventions SDOH Screenings   Food Insecurity: Patient Unable To Answer (10/03/2023)  Recent Concern: Food Insecurity - Food Insecurity Present (09/24/2023)  Housing: High Risk (10/03/2023)  Transportation Needs: Patient Unable To Answer (10/03/2023)  Recent Concern: Transportation Needs - Unmet Transportation Needs (09/24/2023)  Utilities: Patient Unable To Answer (10/03/2023)  Tobacco Use: Medium Risk (10/20/2023)    Readmission Risk Interventions     No data to display

## 2023-10-21 NOTE — Anesthesia Postprocedure Evaluation (Signed)
Anesthesia Post Note  Patient: Michele House  Procedure(s) Performed: VIDEO BRONCHOSCOPY WITH ENDOBRONCHIAL NAVIGATION (Right)  Patient location during evaluation: PACU Anesthesia Type: General Level of consciousness: awake and alert Pain management: pain level controlled Vital Signs Assessment: post-procedure vital signs reviewed and stable Respiratory status: spontaneous breathing, nonlabored ventilation, respiratory function stable and patient connected to nasal cannula oxygen Cardiovascular status: blood pressure returned to baseline and stable Postop Assessment: no apparent nausea or vomiting Anesthetic complications: no   No notable events documented.   Last Vitals:  Vitals:   10/21/23 1534 10/21/23 1858  BP: (!) 110/48 135/72  Pulse: (!) 103 (!) 101  Resp: 19 18  Temp: 36.6 C 36.9 C  SpO2: 92% 97%    Last Pain:  Vitals:   10/21/23 1741  TempSrc:   PainSc: 8                  Lenard Simmer

## 2023-10-21 NOTE — Progress Notes (Signed)
PULMONOLOGY         Date: 10/21/2023,   MRN# [id number] Michele House 11/11/1955     AdmissionWeight: 74.1 kg                 CurrentWeight: 74.1 kg  Referring provider: Dr Nelson Chimes   CHIEF COMPLAINT:   Lung nodule suspicious for carcinoma   HISTORY OF PRESENT ILLNESS   Patient with COPD, dyslipidemia, HTN, thyroid dysfunction, GERD, She has 1.5cm RUL nodule suspicious for primary bronchogenic carcinoma. She needs to have tissue diagnosis. PCCM consultation for biopsy.   10/15/23- patient encepalopathic on examination.  Have ordered ABG, Uric acid, ammonia levels, renal function is normal, electrolytes normal , sugars improved <200.  Neuro and oncology following, s/p LP, s/p paraneoplastic panel, s/p autoimmune panel.  Planning for lung biopsy on 10/29/23 if patient is more stable and safe candidate for general anesthesia  10/17/23- patient awake and alert, she is able to answer questions appropriately.  Encephalopathy has improved markedly. We discussed lung biopsy she has consented to procedure and wishes to proceed as planned.  10/18/23- patient reports leg pain.  She wants to get OOB to walk with PT.  She seems a bit sleepy during interview.  I have dcd claritin.  10/19/23- patient is improved sitting up in bed eating.  She is alert and lucid.  Mild edema on examination. Have ordered CBC and CMP today.  Will diurese today due to 1+ edema on examination.  NPO today at midnight.  Lung biopsy in AM at 730AM 10/20/23- patient evaluated this am , she reports feeling well. She is on 3L/min [place].  She had presurgical evaluation by anesthesia, medical service and neurology yesterday as well as myself.  Group discussion via secure chat with agreement that benefit of this lung biopsy with goal to cure cancer outweighs the risks.  Decision made to proceed as planned by all providers and patient.   10/21/23- patient stable , she is being optimized for dc for rehab.  She's cleared from  pulmonary for discharge. Results from bronch will take 1 week.     PAST MEDICAL HISTORY   Past Medical History:  Diagnosis Date   Acid reflux    COPD (chronic obstructive pulmonary disease) (HCC)    Diabetes mellitus without complication (HCC)    Hyperlipidemia    Hypertension    Thyroid disease      SURGICAL HISTORY   Past Surgical History:  Procedure Laterality Date   appendectomy     APPENDECTOMY     BREAST BIOPSY Right    CORE W/CLIP - NEG   TOTAL VAGINAL HYSTERECTOMY     TUMOR REMOVAL     benign;stomach   VIDEO BRONCHOSCOPY WITH ENDOBRONCHIAL NAVIGATION Right 10/20/2023   Procedure: VIDEO BRONCHOSCOPY WITH ENDOBRONCHIAL NAVIGATION;  Surgeon: Vida Rigger, MD;  Location: [facility] ORS;  Service: Thoracic;  Laterality: Right;     FAMILY HISTORY   Family History  Problem Relation Age of Onset   Heart attack Mother    Hypertension Mother    Breast cancer Sister 25   Breast cancer Maternal Aunt        40'S   Breast cancer Maternal Grandmother      SOCIAL HISTORY   Social History   Tobacco Use   Smoking status: Former    Current packs/day: 0.00    Types: Cigarettes    Quit date: 12/15/1985    Years since quitting: 37.8   Smokeless tobacco: Never  Substance  Use Topics   Alcohol use: No   Drug use: No     MEDICATIONS    Home Medication:    Current Medication:  Current Facility-Administered Medications:    acetaminophen (TYLENOL) tablet 650 mg, 650 mg, Oral, Q6H PRN, Floydene Flock, MD, 650 mg at 10/18/23 0424   clopidogrel (PLAVIX) tablet 75 mg, 75 mg, Oral, Daily, Milon Dikes, MD, 75 mg at 10/21/23 0843   dextromethorphan-guaiFENesin (MUCINEX DM) 30-600 MG per 12 hr tablet 1 tablet, 1 tablet, Oral, BID PRN, Floydene Flock, MD, 1 tablet at 10/18/23 0832   enoxaparin (LOVENOX) injection 40 mg, 40 mg, Subcutaneous, Daily, Alford Highland, MD, 40 mg at 10/21/23 0843   furosemide (LASIX) injection 40 mg, 40 mg, Intravenous, Daily, Vida Rigger, MD, 40 mg at 10/21/23 0843   insulin aspart (novoLOG) injection 0-15 Units, 0-15 Units, Subcutaneous, TID WC, Alford Highland, MD, 5 Units at 10/21/23 7253   insulin aspart (novoLOG) injection 0-5 Units, 0-5 Units, Subcutaneous, QHS, Wieting, Richard, MD, 4 Units at 10/20/23 2152   insulin glargine-yfgn (SEMGLEE) injection 15 Units, 15 Units, Subcutaneous, QHS, Amin, Tilman Neat, MD, 15 Units at 10/20/23 2152   ipratropium-albuterol (DUONEB) 0.5-2.5 (3) MG/3ML nebulizer solution 3 mL, 3 mL, Nebulization, Q4H PRN, Floydene Flock, MD, 3 mL at 10/21/23 0147   levothyroxine (SYNTHROID) tablet 100 mcg, 100 mcg, Oral, Q0600, Floydene Flock, MD, 100 mcg at 10/21/23 0553   menthol-cetylpyridinium (CEPACOL) lozenge 3 mg, 1 lozenge, Oral, PRN, Vida Rigger, MD, 3 mg at 10/20/23 2153   methocarbamol (ROBAXIN) tablet 500 mg, 500 mg, Oral, Q8H PRN, Arnetha Courser, MD, 500 mg at 10/20/23 2020   ondansetron (ZOFRAN) injection 4 mg, 4 mg, Intravenous, Q6H PRN, Lurene Shadow, MD, 4 mg at 10/19/23 1245   Oral care mouth rinse, 15 mL, Mouth Rinse, PRN, Renae Gloss, Richard, MD   QUEtiapine (SEROQUEL) tablet 12.5 mg, 12.5 mg, Oral, QHS PRN, Alford Highland, MD, 12.5 mg at 10/19/23 0040   rOPINIRole (REQUIP XL) 24 hr tablet 4 mg, 4 mg, Oral, QHS, Amin, Tilman Neat, MD, 4 mg at 10/20/23 2152   sertraline (ZOLOFT) tablet 25 mg, 25 mg, Oral, Daily, Alford Highland, MD, 25 mg at 10/21/23 0843   traMADol (ULTRAM) tablet 50 mg, 50 mg, Oral, Q6H PRN, Arnetha Courser, MD, 50 mg at 10/20/23 2020    ALLERGIES   Penicillins, Aspirin, and Sulfa antibiotics     REVIEW OF SYSTEMS    Review of Systems:  Gen:  Denies  fever, sweats, chills weigh loss  HEENT: Denies blurred vision, double vision, ear pain, eye pain, hearing loss, nose bleeds, sore throat Cardiac:  No dizziness, chest pain or heaviness, chest tightness,edema Resp:   reports dyspnea chronically  Gi: Denies swallowing difficulty, stomach pain, nausea or  vomiting, diarrhea, constipation, bowel incontinence Gu:  Denies bladder incontinence, burning urine Ext:   Denies Joint pain, stiffness or swelling Skin: Denies  skin rash, easy bruising or bleeding or hives Endoc:  Denies polyuria, polydipsia , polyphagia or weight change Psych:   Denies depression, insomnia or hallucinations   Other:  All other systems negative   VS: BP (!) 138/57 (BP Location: Right Arm)   Pulse 88   Temp 97.8 F (36.6 C) (Oral)   Resp 18   Ht 5' 0.95" (1.548 m)   Wt 74.1 kg   LMP 12/24/1998 Comment: prior to her hysterectomy  SpO2 95%   BMI 30.92 kg/m      PHYSICAL EXAM    GENERAL:NAD, no  fevers, chills, no weakness no fatigue HEAD: Normocephalic, atraumatic.  EYES: Pupils equal, round, reactive to light. Extraocular muscles intact. No scleral icterus.  MOUTH: Moist mucosal membrane. Dentition intact. No abscess noted.  EAR, NOSE, THROAT: Clear without exudates. No external lesions.  NECK: Supple. No thyromegaly. No nodules. No JVD.  PULMONARY: decreased breath sounds with mild rhonchi worse at bases bilaterally.  CARDIOVASCULAR: S1 and S2. Regular rate and rhythm. No murmurs, rubs, or gallops. No edema. Pedal pulses 2+ bilaterally.  GASTROINTESTINAL: Soft, nontender, nondistended. No masses. Positive bowel sounds. No hepatosplenomegaly.  MUSCULOSKELETAL: No swelling, clubbing, or edema. Range of motion full in all extremities.  NEUROLOGIC: Cranial nerves II through XII are intact. No gross focal neurological deficits. Appears tired with excessive sleepiness  SKIN: No ulceration, lesions, rashes, or cyanosis. Skin warm and dry. Turgor intact.  PSYCHIATRIC: Mood, affect within normal limits. The patient is awake, alert and oriented x 3. Insight, judgment intact.       IMAGING   CT chest -  Enlarging spiculated nodule in the inferior right upper lobe now 1.6 cm compared to 1.3 cm previously.   ASSESSMENT/PLAN   Lung nodule suspicious for primary  bronchogenic carcinoma   1.5cm - RUL   -patient would require biopsy - will order bronchoscopy     -Reviewed risks/complications and benefits with patient, risks include infection, pneumothorax/pneumomediastinum which may require chest tube placement as well as overnight/prolonged hospitalization and possible mechanical ventilation. Other risks include bleeding and very rarely death.  Patient understands risks and wishes to proceed.  Additional questions were answered, and patient is aware that post procedure patient will be going home with family and may experience cough with possible clots on expectoration as well as phlegm which may last few days as well as hoarseness of voice post intubation and mechanical ventilation.      Encephalopathy- RESOLVED  S/p neuro and med/onc eval  - ordered ammonia, uric acid, ABG today -reviewed    COPD with bronchitic phenotype   - patient reports tightness in chest today   - she reports panic attack when resp status deteriorates.  She is on 3L/min [place] at this time.    -She uses albuterol and Breo at home   Thank you for allowing me to participate in the care of this patient.   Patient/Family are satisfied with care plan and all questions have been answered.    Provider disclosure: Patient with at least one acute or chronic illness or injury that poses a threat to life or bodily function and is being managed actively during this encounter.  All of the below services have been performed independently by signing provider:  review of prior documentation from internal and or external health records.  Review of previous and current lab results.  Interview and comprehensive assessment during patient visit today. Review of current and previous chest radiographs/CT scans. Discussion of management and test interpretation with health care team and patient/family.   This document was prepared using Dragon voice recognition software and may include unintentional  dictation errors.     Vida Rigger, M.D.  Division of Pulmonary & Critical Care Medicine

## 2023-10-21 NOTE — Progress Notes (Signed)
Patient refuses third NIH assessment of the day due to decreased rest last evening.

## 2023-10-21 NOTE — TOC Progression Note (Signed)
Transition of Care [facility]) - Progression Note    Patient Details  Name: Michele House MRN: [id number] Date of Birth: 06-11-1956  [facility]) CM/SW Contact  Allena Katz, LCSW Phone Number: 10/21/2023, 10:30 AM  Clinical Narrative:   MD states we are good to start auth for rehab. CSW has requested updated notes from PT.    Expected Discharge Plan: Skilled Nursing Facility Barriers to Discharge: Continued Medical Work up  Expected Discharge Plan and Services                                               Social Determinants of Health (SDOH) Interventions SDOH Screenings   Food Insecurity: Patient Unable To Answer (10/03/2023)  Recent Concern: Food Insecurity - Food Insecurity Present (09/24/2023)  Housing: High Risk (10/03/2023)  Transportation Needs: Patient Unable To Answer (10/03/2023)  Recent Concern: Transportation Needs - Unmet Transportation Needs (09/24/2023)  Utilities: Patient Unable To Answer (10/03/2023)  Tobacco Use: Medium Risk (10/20/2023)    Readmission Risk Interventions     No data to display

## 2023-10-21 NOTE — Care Management Important Message (Signed)
Important Message  Patient Details  Name: Michele House MRN: [id number] Date of Birth: 09-21-1956   Important Message Given:  Yes - Medicare IM     Bernadette Hoit 10/21/2023, 9:41 AM

## 2023-10-22 DIAGNOSIS — G9341 Metabolic encephalopathy: Secondary | ICD-10-CM | POA: Diagnosis not present

## 2023-10-22 LAB — GLUCOSE, CAPILLARY
Glucose-Capillary: 167 mg/dL — ABNORMAL HIGH (ref 70–99)
Glucose-Capillary: 333 mg/dL — ABNORMAL HIGH (ref 70–99)
Glucose-Capillary: 187 mg/dL — ABNORMAL HIGH (ref 70–99)
Glucose-Capillary: 140 mg/dL — ABNORMAL HIGH (ref 70–99)

## 2023-10-22 LAB — CULTURE, BAL-QUANTITATIVE W GRAM STAIN: Culture: 20000 — AB

## 2023-10-22 LAB — SURGICAL PATHOLOGY

## 2023-10-22 LAB — CYTOLOGY - NON PAP

## 2023-10-22 NOTE — Progress Notes (Signed)
Speech Language Pathology Treatment: Cognitive-Linquistic  Patient Details Name: Michele House MRN: [id number] DOB: [birth year] Today's Date: 10/22/2023 Time: 4259-5638 SLP Time Calculation (min) (ACUTE ONLY): 10 min  Assessment / Plan / Recommendation Clinical Impression  Pt was awake but notable increased WOB at rest with some pursed lip breathing when talking. Pt continues to be able t consume dysphagia 3 diet without any overt s/s of aspiration or a more increased WOB but will be conservative with upgrade and allow pt to remain on dysphagia for energy and respiratory conservation. Both and pt and nurse were in agreement with this plan.   Pt presents with improved cognitive linguistic abilities. Pt was oriented x 3, able to sustain attention to speaker and comprehend basic information and directions from nursing and this Clinical research associate. Pt able to communicate her basic wants/needs effectively. Will defer higher level cognitive problem solving to next venue of care. Continue to recommend 24 hour supervision at discharge.     HPI HPI: Pt is a 67 y/o F admitted on 10/02/23 after presenting with acute on chronic respiratory failure with hypoxia, COPD exacerbation, PNA, UTI, & encephalopathy. PMH: COPD, chronic respiratory failure on 2L,HTN, DM2, HFpEF. Head CT/CTA, 12/11, "CT HEAD:     1. 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 No  associated hemorrhage evident by CT. No significant regional mass  effect.  2. No other new acute intracranial abnormality.  3. 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 No hydrocephalus.  4. Age-related cerebral atrophy with mild chronic microvascular  ischemic disease.     CTA HEAD AND NECK:     1. Negative CTA for large vessel occlusion or other emergent  finding.  2. Atheromatous change about the carotid bifurcations with up to  50%  stenosis bilaterally.  3. 1.7 cm spiculated right upper lobe pulmonary nodule. Again,  further evaluation with dedicated PET-CT recommended."      SLP Plan  All goals met      Recommendations for follow up therapy are one component of a multi-disciplinary discharge planning process, led by the attending physician.  Recommendations may be updated based on patient status, additional functional criteria and insurance authorization.    Recommendations  Diet recommendations: Dysphagia 3 (mechanical soft);Thin liquid                  Oral care BID   Frequent or constant Supervision/Assistance Cognitive communication deficit (R41.841);Dysphagia, oral phase (R13.11)     All goals met    Joe Tanney B. Dreama Saa, M.S., CCC-SLP, Tree surgeon Certified Brain Injury Specialist [facility]  [facility] Rehabilitation Services Office [phone] Ascom [phone] Fax [phone]

## 2023-10-22 NOTE — Progress Notes (Signed)
Progress Note    Michele House  NWG:[id number] DOB: December 08, 1955  DOA: 10/02/2023 PCP: [facility]      Brief Narrative:    Medical records reviewed and are as summarized below:  Michele House is a 67 y.o. female with medical history significant of COPD, chronic respiratory failure on 2 L, hypertension, type 2 diabetes, HFpEF, recent discharge from the hospital 24th November 2024 after hospitalization for COPD exacerbation complicated by respiratory failure.  She presented to the hospital again on 10/02/2023 with chest pain, malaise, cough, shortness of breath, abdominal pain and dysuria.  She was admitted to the hospital for acute on chronic hypoxemic respiratory failure due to COPD exacerbation, UTI, and pneumonia.  She became more confused in the hospital and MRI brain revealed acute stroke.  12/1: Vital stable, respiratory viral panel negative, procalcitonin negative, preliminary blood cultures negative.  Urine cultures are ordered as add-on, ammonia levels mildly elevated at 39, strep pneumo negative, CBG elevated at 244, history of enlarging pulmonary nodule with recommendations for PET/CT during most recent admission which has not been done yet, if video bronchoscopy was scheduled for 10/13/2023 as outpatient.  Unable to take care of himself and son cannot provide the required care as she required 24-hour supervision due to worsening dementia.  PT is recommending SNF.  12/2: Hemodynamically stable, now on baseline oxygen of 2 L.  Needs SNF placement. Urine cultures pending.  12/3: Blood pressure started trending up, restarting home Zestoretic.  Urine cultures with strep agalactiae, penicillin allergy noted, will continue with ceftriaxone for now.  Also started on Remeron for concern of worsening depression and unable to sleep at night  12/4.  Some very concerned about patient's mental status not being seen.  Patient receiving antibiotics already and  should have improved by now.  Will hold Lipitor.  Restart low-dose Zoloft.  Discontinue Remeron and do Seroquel at night. 12/5.  Patient has some blurry vision and weakness and pain.  MRI of the brain negative for acute intracranial abnormality. 12/6.  Patient stated she slept well.  Patient able to lift up her arms up off the bed today.  Patient thought she heard her son's voice outside the room. 12/7.  Patient sitting up in chair when I saw her.  Was able to feed herself breakfast.  Felt okay.  Did not offer any complaints. 12/8.  Patient more confused today than yesterday.  Able to follow some simple commands but not as good of a conversation today as yesterday. 12/9.  Patient still confused today.  Does not follow simple commands today. 12/10.  Patient more talkative today but was looking up at the ceiling when she was talking with me.  She does not move her extremities to command but does move them on her own.  Physical therapy and Occupational Therapy did not see her move her right arm. 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  Mild associated petechial hemorrhage.  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 12/12: Patient with new stroke, CTA head and neck with no large vessel occlusion.  Patient had a recent echocardiogram which was normal. CSF cultures remain negative.  Lipid panel with increased triglyceride at 347, HDL 29 and LDL of 38.  CBC with mild thrombocytopenia at 130, BMP mostly unremarkable.  Patient with significant  right-sided weakness. Concern of paraneoplastic versus hypercoagulability secondary to malignancy.  Patient missed her appointment for bronchoscopy and outpatient appointment for PET scan due to recurrent hospitalizations. Involving pulmonary and oncology for their input. Send out  encephalitis panel still pending.  12/13: Patient continued to have waxing and waning mental status, unable to move right upper and lower extremities.  Having some hallucination.  Pulmonary is planning lung biopsy on 12/27 if she remains stable.  Rapidly progressive dementia and now with underlying stroke, question of paraneoplastic encephalitis and hypercoagulability secondary to underlying undiagnosed malignancy. Patient will also get benefit from neuropsych evaluation as outpatient.  12/14: Patient with some improved mentation today.  Trying to squeeze with right hand but still no movements of right lower extremity.  12/15: Patient with much improved mentation.  Having some flickering of movements on right upper and lower extremity today, left extremities weaker but seems improving.   12/16: Mental status seems stable, flaccid right upper and lower extremities.  Complaining of muscle spasms so Robaxin was ordered.  Pulmonary is trying to do bronchoscopy and biopsy on Wednesday.      Assessment/Plan:   Principal Problem:   Acute metabolic encephalopathy Active Problems:   CVA (cerebral vascular accident) (HCC)   Uncontrolled type 2 diabetes mellitus with hypoglycemia, without long-term current use of insulin (HCC)   Hypotension   Acute on chronic respiratory failure with hypoxia (HCC)   Hypernatremia   COPD exacerbation (HCC)   Lung nodule   Pneumonia   UTI (urinary tract infection)   (HFpEF) heart failure with preserved ejection fraction (HCC)   Essential hypertension   Type 2 diabetes mellitus with obesity (HCC)    Body mass index is 30.92 kg/m.   Acute metabolic encephalopathy: Improved. 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   She is A&O to person, place, and situation this AM.   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  Minimal improvement in R sided deficits.   -Continue Plavix. If she is found to have underlying malignancy and hypercoagulable state then she may need anticoagulation instead of Plavix per neurologist.   Type II DM with hyperglycemia: Continue Semglee 15 units nightly and SSI   Hypotension: Improved   Pneumonia: Completed 5 days of IV ceftriaxone and azithromycin  Acute UTI: Urine culture showed strep agalactiae.  Completed IV ceftriaxone.   COPD exacerbation: Resolved.  Continue bronchodilators Acute on chronic hypoxemic respiratory failure: Continue 2 L/min oxygen via nasal cannula   1.3 cm right upper lung spiculated nodule: S/p bronchoscopy on 10/20/2023.  Follow-up pathology report.  Patient had missed her appointments for follow-up in the past.      Diet Order             DIET DYS 3 Room service appropriate? Yes; Fluid consistency: Thin  Diet effective now                            Consultants: Pulmonologist Hematologist  Procedures: Lumbar puncture on 10/08/2023 Bronchoscopy on 10/20/2023    Medications:    clopidogrel  75 mg Oral Daily   enoxaparin (LOVENOX) injection  40 mg Subcutaneous Daily   furosemide  40 mg Intravenous Daily   insulin  aspart  0-15 Units Subcutaneous TID WC   insulin aspart  0-5 Units Subcutaneous QHS   insulin glargine-yfgn  15 Units Subcutaneous QHS   levothyroxine  100 mcg Oral Q0600   rOPINIRole  4 mg Oral QHS   sertraline  25 mg Oral Daily   Continuous Infusions:   Anti-infectives (From admission, onward)    Start     Dose/Rate Route Frequency Ordered Stop   10/04/23 1000  azithromycin (ZITHROMAX) tablet 500 mg        500 mg Oral Daily 10/03/23 0751 10/06/23 0856   10/03/23 0600  cefTRIAXone (ROCEPHIN) 2 g in sodium chloride 0.9 % 100 mL IVPB   Status:  Discontinued        2 g 200 mL/hr over 30 Minutes Intravenous Every 24 hours 10/02/23 0740 10/07/23 0854   10/02/23 0745  cefTRIAXone (ROCEPHIN) 2 g in sodium chloride 0.9 % 100 mL IVPB  Status:  Discontinued        2 g 200 mL/hr over 30 Minutes Intravenous Every 24 hours 10/02/23 0738 10/02/23 0740   10/02/23 0745  azithromycin (ZITHROMAX) 500 mg in sodium chloride 0.9 % 250 mL IVPB        500 mg 250 mL/hr over 60 Minutes Intravenous Every 24 hours 10/02/23 0738 10/03/23 1236   10/02/23 0630  cefTRIAXone (ROCEPHIN) 2 g in sodium chloride 0.9 % 100 mL IVPB        2 g 200 mL/hr over 30 Minutes Intravenous  Once 10/02/23 0617 10/02/23 0714              Family Communication/Anticipated D/C date and plan/Code Status   DVT prophylaxis: enoxaparin (LOVENOX) injection 40 mg Start: 10/12/23 1115 SCDs Start: 10/02/23 8469     Code Status: Full Code  Family Communication: None Disposition Plan: Plan to discharge to SNF    Status is: Inpatient Remains inpatient appropriate because: Awaiting placement to SNF       Subjective:   Interval events noted.  She denies chest pain or shortness of breath.   Objective:    Vitals:   10/22/23 0546 10/22/23 0746 10/22/23 1207 10/22/23 1220  BP: 123/74 117/65 (!) 159/124 114/64  Pulse: 97 (!) 101 (!) 125 (!) 116  Resp: 18 17 18 20   Temp: 98.3 F (36.8 C) 98.2 F (36.8 C) 98 F (36.7 C) 97.9 F (36.6 C)  TempSrc:  Oral  Oral  SpO2: 98% 97% 95% 94%  Weight:      Height:       No data found.   Intake/Output Summary (Last 24 hours) at 10/22/2023 1444 Last data filed at 10/22/2023 0605 Gross per 24 hour  Intake 240 ml  Output 1950 ml  Net -1710 ml   Filed Weights   10/05/23 0912 10/20/23 0732  Weight: 74.1 kg 74.1 kg    Physical Exam  Constitutional: Chronically ill appearing and in no distress.   Eyes: EOM are normal.  Cardiovascular: Normal rate, regular rhythm, intact distal pulses. No lower extremity  edema  Pulmonary: Non labored breathing on room air, no wheezing or rales  Abdominal: Soft. Normal bowel sounds. Non distended and non tender Neurological: Alert and oriented to person, place, and time. 1/5 strength of RUE/RLE Skin: Skin is warm and dry.     Data Reviewed:   I have personally reviewed following labs and imaging studies:  Labs: Labs show the following:   Basic Metabolic Panel: Recent Labs  Lab 10/19/23 1252  NA 135  K  4.5  CL 93*  CO2 33*  GLUCOSE 201*  BUN 13  CREATININE 0.81  CALCIUM 8.9   GFR Estimated Creatinine Clearance: 60.3 mL/min (by C-G formula based on SCr of 0.81 mg/dL). Liver Function Tests: Recent Labs  Lab 10/19/23 1252  AST 18  ALT 18  ALKPHOS 70  BILITOT <0.2  PROT 5.4*  ALBUMIN 3.1*   No results for input(s): "LIPASE", "AMYLASE" in the last 168 hours. No results for input(s): "AMMONIA" in the last 168 hours.  Coagulation profile No results for input(s): "INR", "PROTIME" in the last 168 hours.  CBC: Recent Labs  Lab 10/19/23 1252  WBC 6.7  NEUTROABS 5.3  HGB 9.6*  HCT 29.5*  MCV 90.8  PLT 188   Cardiac Enzymes: No results for input(s): "CKTOTAL", "CKMB", "CKMBINDEX", "TROPONINI" in the last 168 hours. BNP (last 3 results) No results for input(s): "PROBNP" in the last 8760 hours. CBG: Recent Labs  Lab 10/21/23 1202 10/21/23 1554 10/21/23 2121 10/22/23 0748 10/22/23 1209  GLUCAP 198* 275* 123* 167* 333*   D-Dimer: No results for input(s): "DDIMER" in the last 72 hours. Hgb A1c: No results for input(s): "HGBA1C" in the last 72 hours. Lipid Profile: No results for input(s): "CHOL", "HDL", "LDLCALC", "TRIG", "CHOLHDL", "LDLDIRECT" in the last 72 hours. Thyroid function studies: No results for input(s): "TSH", "T4TOTAL", "T3FREE", "THYROIDAB" in the last 72 hours.  Invalid input(s): "FREET3" Anemia work up: No results for input(s): "VITAMINB12", "FOLATE", "FERRITIN", "TIBC", "IRON", "RETICCTPCT" in the last  72 hours. Sepsis Labs: Recent Labs  Lab 10/19/23 1252  WBC 6.7    Microbiology Recent Results (from the past 240 hours)  Culture, BAL-quantitative w Gram Stain     Status: None (Preliminary result)   Collection Time: 10/20/23  9:49 AM   Specimen: Bronchoalveolar Lavage; Respiratory  Result Value Ref Range Status   Specimen Description   Final    BRONCHIAL ALVEOLAR LAVAGE Performed at [facility], [address], [place], [place] [address]    Special Requests   Final    NONE Performed at [facility], [address] [place], [place] [address]    Gram Stain PENDING  Incomplete   Culture   Final    CULTURE REINCUBATED FOR BETTER GROWTH Performed at [facility], [address] [place], [place] [address]    Report Status PENDING  Incomplete    Procedures and diagnostic studies:  No results found.   LOS: 20 days   [employer]  [facility]   Pager on [email or web address]. If 7PM-7AM, please contact night-coverage at [email or web address]   10/22/2023, 2:44 PM

## 2023-10-22 NOTE — Progress Notes (Signed)
PULMONOLOGY         Date: 10/22/2023,   MRN# [id number] Michele House [birth year]     AdmissionWeight: 74.1 kg                 CurrentWeight: 74.1 kg  Referring provider: Dr Nelson Chimes   CHIEF COMPLAINT:   Lung nodule suspicious for carcinoma   HISTORY OF PRESENT ILLNESS   Patient with COPD, dyslipidemia, HTN, thyroid dysfunction, GERD, She has 1.5cm RUL nodule suspicious for primary bronchogenic carcinoma. She needs to have tissue diagnosis. PCCM consultation for biopsy.   10/15/23- patient encepalopathic on examination.  Have ordered ABG, Uric acid, ammonia levels, renal function is normal, electrolytes normal , sugars improved <200.  Neuro and oncology following, s/p LP, s/p paraneoplastic panel, s/p autoimmune panel.  Planning for lung biopsy on 10/29/23 if patient is more stable and safe candidate for general anesthesia  10/17/23- patient awake and alert, she is able to answer questions appropriately.  Encephalopathy has improved markedly. We discussed lung biopsy she has consented to procedure and wishes to proceed as planned.  10/18/23- patient reports leg pain.  She wants to get OOB to walk with PT.  She seems a bit sleepy during interview.  I have dcd claritin.  10/19/23- patient is improved sitting up in bed eating.  She is alert and lucid.  Mild edema on examination. Have ordered CBC and CMP today.  Will diurese today due to 1+ edema on examination.  NPO today at midnight.  Lung biopsy in AM at 730AM 10/20/23- patient evaluated this am , she reports feeling well. She is on 3L/min [place].  She had presurgical evaluation by anesthesia, medical service and neurology yesterday as well as myself.  Group discussion via secure chat with agreement that benefit of this lung biopsy with goal to cure cancer outweighs the risks.  Decision made to proceed as planned by all providers and patient.   10/21/23- patient stable , she is being optimized for dc for rehab.  She's cleared from  pulmonary for discharge. Results from bronch will take 1 week.  10/22/23- patient seen at bedside , she's on 2-3L/min [place].  She reports anxiety due to not having her pillows fluffed and she did not receive warmed blanket that she requested.  She reports being jittery tonight but denies pain anywhere.  She has been in bed but states she had PT today.    PAST MEDICAL HISTORY   Past Medical History:  Diagnosis Date   Acid reflux    COPD (chronic obstructive pulmonary disease) (HCC)    Diabetes mellitus without complication (HCC)    Hyperlipidemia    Hypertension    Thyroid disease      SURGICAL HISTORY   Past Surgical History:  Procedure Laterality Date   appendectomy     APPENDECTOMY     BREAST BIOPSY Right    CORE W/CLIP - NEG   TOTAL VAGINAL HYSTERECTOMY     TUMOR REMOVAL     benign;stomach   VIDEO BRONCHOSCOPY WITH ENDOBRONCHIAL NAVIGATION Right 10/20/2023   Procedure: VIDEO BRONCHOSCOPY WITH ENDOBRONCHIAL NAVIGATION;  Surgeon: Vida Rigger, MD;  Location: [facility] ORS;  Service: Thoracic;  Laterality: Right;     FAMILY HISTORY   Family History  Problem Relation Age of Onset   Heart attack Mother    Hypertension Mother    Breast cancer Sister 26   Breast cancer Maternal Aunt        40'S   Breast cancer  Maternal Grandmother      SOCIAL HISTORY   Social History   Tobacco Use   Smoking status: Former    Current packs/day: 0.00    Types: Cigarettes    Quit date: 12/15/1985    Years since quitting: 37.8   Smokeless tobacco: Never  Substance Use Topics   Alcohol use: No   Drug use: No     MEDICATIONS    Home Medication:    Current Medication:  Current Facility-Administered Medications:    acetaminophen (TYLENOL) tablet 650 mg, 650 mg, Oral, Q6H PRN, Floydene Flock, MD, 650 mg at 10/18/23 0424   clopidogrel (PLAVIX) tablet 75 mg, 75 mg, Oral, Daily, Milon Dikes, MD, 75 mg at 10/22/23 0815   dextromethorphan-guaiFENesin (MUCINEX DM) 30-600 MG per 12  hr tablet 1 tablet, 1 tablet, Oral, BID PRN, Floydene Flock, MD, 1 tablet at 10/21/23 1351   enoxaparin (LOVENOX) injection 40 mg, 40 mg, Subcutaneous, Daily, Renae Gloss, Richard, MD, 40 mg at 10/22/23 0816   furosemide (LASIX) injection 40 mg, 40 mg, Intravenous, Daily, Vida Rigger, MD, 40 mg at 10/22/23 0815   insulin aspart (novoLOG) injection 0-15 Units, 0-15 Units, Subcutaneous, TID WC, Alford Highland, MD, 3 Units at 10/22/23 0817   insulin aspart (novoLOG) injection 0-5 Units, 0-5 Units, Subcutaneous, QHS, Wieting, Richard, MD, 4 Units at 10/20/23 2152   insulin glargine-yfgn (SEMGLEE) injection 15 Units, 15 Units, Subcutaneous, QHS, Amin, Tilman Neat, MD, 15 Units at 10/21/23 2228   ipratropium-albuterol (DUONEB) 0.5-2.5 (3) MG/3ML nebulizer solution 3 mL, 3 mL, Nebulization, Q4H PRN, Floydene Flock, MD, 3 mL at 10/21/23 2247   levothyroxine (SYNTHROID) tablet 100 mcg, 100 mcg, Oral, Q0600, Floydene Flock, MD, 100 mcg at 10/22/23 0601   menthol-cetylpyridinium (CEPACOL) lozenge 3 mg, 1 lozenge, Oral, PRN, Vida Rigger, MD, 3 mg at 10/22/23 9604   methocarbamol (ROBAXIN) tablet 500 mg, 500 mg, Oral, Q8H PRN, Arnetha Courser, MD, 500 mg at 10/21/23 1741   ondansetron (ZOFRAN) injection 4 mg, 4 mg, Intravenous, Q6H PRN, Lurene Shadow, MD, 4 mg at 10/19/23 1245   Oral care mouth rinse, 15 mL, Mouth Rinse, PRN, Renae Gloss, Richard, MD   QUEtiapine (SEROQUEL) tablet 12.5 mg, 12.5 mg, Oral, QHS PRN, Alford Highland, MD, 12.5 mg at 10/21/23 2229   rOPINIRole (REQUIP XL) 24 hr tablet 4 mg, 4 mg, Oral, QHS, Amin, Tilman Neat, MD, 4 mg at 10/20/23 2152   sertraline (ZOLOFT) tablet 25 mg, 25 mg, Oral, Daily, Wieting, Richard, MD, 25 mg at 10/22/23 0815   traMADol (ULTRAM) tablet 50 mg, 50 mg, Oral, Q6H PRN, Arnetha Courser, MD, 50 mg at 10/21/23 2229    ALLERGIES   Penicillins, Aspirin, and Sulfa antibiotics     REVIEW OF SYSTEMS    Review of Systems:  Gen:  Denies  fever, sweats, chills  weigh loss  HEENT: Denies blurred vision, double vision, ear pain, eye pain, hearing loss, nose bleeds, sore throat Cardiac:  No dizziness, chest pain or heaviness, chest tightness,edema Resp:   reports dyspnea chronically  Gi: Denies swallowing difficulty, stomach pain, nausea or vomiting, diarrhea, constipation, bowel incontinence Gu:  Denies bladder incontinence, burning urine Ext:   Denies Joint pain, stiffness or swelling Skin: Denies  skin rash, easy bruising or bleeding or hives Endoc:  Denies polyuria, polydipsia , polyphagia or weight change Psych:   Denies depression, insomnia or hallucinations   Other:  All other systems negative   VS: BP 117/65 (BP Location: Left Arm)   Pulse (!) 101  Temp 98.2 F (36.8 C) (Oral)   Resp 17   Ht 5' 0.95" (1.548 m)   Wt 74.1 kg   LMP 12/24/1998 Comment: prior to her hysterectomy  SpO2 97%   BMI 30.92 kg/m      PHYSICAL EXAM    GENERAL:NAD, no fevers, chills, no weakness no fatigue HEAD: Normocephalic, atraumatic.  EYES: Pupils equal, round, reactive to light. Extraocular muscles intact. No scleral icterus.  MOUTH: Moist mucosal membrane. Dentition intact. No abscess noted.  EAR, NOSE, THROAT: Clear without exudates. No external lesions.  NECK: Supple. No thyromegaly. No nodules. No JVD.  PULMONARY: decreased breath sounds with mild rhonchi worse at bases bilaterally.  CARDIOVASCULAR: S1 and S2. Regular rate and rhythm. No murmurs, rubs, or gallops. No edema. Pedal pulses 2+ bilaterally.  GASTROINTESTINAL: Soft, nontender, nondistended. No masses. Positive bowel sounds. No hepatosplenomegaly.  MUSCULOSKELETAL: No swelling, clubbing, or edema. Range of motion full in all extremities.  NEUROLOGIC: Cranial nerves II through XII are intact. No gross focal neurological deficits. Appears tired with excessive sleepiness  SKIN: No ulceration, lesions, rashes, or cyanosis. Skin warm and dry. Turgor intact.  PSYCHIATRIC: Mood, affect within  normal limits. The patient is awake, alert and oriented x 3. Insight, judgment intact.       IMAGING   CT chest -  Enlarging spiculated nodule in the inferior right upper lobe now 1.6 cm compared to 1.3 cm previously.   ASSESSMENT/PLAN   Lung nodule suspicious for primary bronchogenic carcinoma   1.5cm - RUL   -patient would require biopsy - will order bronchoscopy     -Reviewed risks/complications and benefits with patient, risks include infection, pneumothorax/pneumomediastinum which may require chest tube placement as well as overnight/prolonged hospitalization and possible mechanical ventilation. Other risks include bleeding and very rarely death.  Patient understands risks and wishes to proceed.  Additional questions were answered, and patient is aware that post procedure patient will be going home with family and may experience cough with possible clots on expectoration as well as phlegm which may last few days as well as hoarseness of voice post intubation and mechanical ventilation.  -s/p bronchoscopy patient is recovering well optimzing for dc home     Encephalopathy- RESOLVED  S/p neuro and med/onc eval  - ordered ammonia, uric acid, ABG today -reviewed    COPD with bronchitic phenotype   - patient reports tightness in chest today   - she reports panic attack when resp status deteriorates.  She is on 3L/min O2 at this time.    -She uses albuterol and Breo at home   Thank you for allowing me to participate in the care of this patient.   Patient/Family are satisfied with care plan and all questions have been answered.    Provider disclosure: Patient with at least one acute or chronic illness or injury that poses a threat to life or bodily function and is being managed actively during this encounter.  All of the below services have been performed independently by signing provider:  review of prior documentation from internal and or external health records.  Review of  previous and current lab results.  Interview and comprehensive assessment during patient visit today. Review of current and previous chest radiographs/CT scans. Discussion of management and test interpretation with health care team and patient/family.   This document was prepared using Dragon voice recognition software and may include unintentional dictation errors.     Vida Rigger, M.D.  Division of Pulmonary & Critical Care Medicine

## 2023-10-22 NOTE — Plan of Care (Signed)
Pt alert and oriented, with dyspnea and expiratory wheezing. Pt has right sided weakness and is a feeder from CVA. Pt is on 3L/ hx of COPD and smoker.  Pt received duoneb and mucinex prn for congestion and dyspnea.  Problem: Education: Goal: Ability to describe self-care measures that may prevent or decrease complications (Diabetes Survival Skills Education) will improve Outcome: Not Progressing Goal: Individualized Educational Video(s) Outcome: Not Progressing   Problem: Coping: Goal: Ability to adjust to condition or change in health will improve Outcome: Not Progressing   Problem: Fluid Volume: Goal: Ability to maintain a balanced intake and output will improve Outcome: Not Progressing   Problem: Health Behavior/Discharge Planning: Goal: Ability to identify and utilize available resources and services will improve Outcome: Not Progressing Goal: Ability to manage health-related needs will improve Outcome: Not Progressing   Problem: Metabolic: Goal: Ability to maintain appropriate glucose levels will improve Outcome: Not Progressing   Problem: Nutritional: Goal: Maintenance of adequate nutrition will improve Outcome: Not Progressing Goal: Progress toward achieving an optimal weight will improve Outcome: Not Progressing   Problem: Skin Integrity: Goal: Risk for impaired skin integrity will decrease Outcome: Not Progressing   Problem: Tissue Perfusion: Goal: Adequacy of tissue perfusion will improve Outcome: Not Progressing   Problem: Education: Goal: Knowledge of General Education information will improve Description: Including pain rating scale, medication(s)/side effects and non-pharmacologic comfort measures Outcome: Not Progressing   Problem: Health Behavior/Discharge Planning: Goal: Ability to manage health-related needs will improve Outcome: Not Progressing   Problem: Clinical Measurements: Goal: Ability to maintain clinical measurements within normal limits  will improve Outcome: Not Progressing Goal: Will remain free from infection Outcome: Not Progressing Goal: Diagnostic test results will improve Outcome: Not Progressing Goal: Respiratory complications will improve Outcome: Not Progressing Goal: Cardiovascular complication will be avoided Outcome: Not Progressing   Problem: Activity: Goal: Risk for activity intolerance will decrease Outcome: Not Progressing   Problem: Nutrition: Goal: Adequate nutrition will be maintained Outcome: Not Progressing   Problem: Coping: Goal: Level of anxiety will decrease Outcome: Not Progressing   Problem: Elimination: Goal: Will not experience complications related to bowel motility Outcome: Not Progressing Goal: Will not experience complications related to urinary retention Outcome: Not Progressing   Problem: Pain Management: Goal: General experience of comfort will improve Outcome: Not Progressing   Problem: Safety: Goal: Ability to remain free from injury will improve Outcome: Not Progressing   Problem: Skin Integrity: Goal: Risk for impaired skin integrity will decrease Outcome: Not Progressing   Problem: Education: Goal: Knowledge of disease or condition will improve Outcome: Not Progressing Goal: Knowledge of the prescribed therapeutic regimen will improve Outcome: Not Progressing Goal: Individualized Educational Video(s) Outcome: Not Progressing   Problem: Activity: Goal: Ability to tolerate increased activity will improve Outcome: Not Progressing Goal: Will verbalize the importance of balancing activity with adequate rest periods Outcome: Not Progressing   Problem: Respiratory: Goal: Ability to maintain a clear airway will improve Outcome: Not Progressing Goal: Levels of oxygenation will improve Outcome: Not Progressing Goal: Ability to maintain adequate ventilation will improve Outcome: Not Progressing   Problem: Activity: Goal: Ability to tolerate increased  activity will improve Outcome: Not Progressing   Problem: Clinical Measurements: Goal: Ability to maintain a body temperature in the normal range will improve Outcome: Not Progressing   Problem: Respiratory: Goal: Ability to maintain adequate ventilation will improve Outcome: Not Progressing Goal: Ability to maintain a clear airway will improve Outcome: Not Progressing   Problem: Education: Goal: Knowledge of disease or  condition will improve Outcome: Not Progressing Goal: Knowledge of secondary prevention will improve (MUST DOCUMENT ALL) Outcome: Not Progressing Goal: Knowledge of patient specific risk factors will improve Loraine Leriche N/A or DELETE if not current risk factor) Outcome: Not Progressing

## 2023-10-22 NOTE — Progress Notes (Signed)
PT Cancellation Note  Patient Details Name: Michele House MRN: [id number] DOB: 1955-12-02   Cancelled Treatment:    Reason Eval/Treat Not Completed: Other (comment).  Chart reviewed.  Pt declined PT d/t feeling SOB with any activity and wanting to rest (pt's HR 116 bpm and SpO2 sats 94% on 3 L O2 via nasal cannula at rest); nurse aware of pt's status (discussed with nursing prior to attempted session).  Will re-attempt PT session at a later date/time.  Hendricks Limes, PT 10/22/23, 3:55 PM

## 2023-10-22 NOTE — Plan of Care (Signed)
Stroke Care Plan  Problem: Education: Goal: Ability to describe self-care measures that may prevent or decrease complications (Diabetes Survival Skills Education) will improve Outcome: Progressing   Problem: Coping: Goal: Ability to adjust to condition or change in health will improve Outcome: Progressing   Problem: Skin Integrity: Goal: Risk for impaired skin integrity will decrease Outcome: Progressing   Problem: Tissue Perfusion: Goal: Adequacy of tissue perfusion will improve Outcome: Progressing   Problem: Clinical Measurements: Goal: Will remain free from infection Outcome: Progressing

## 2023-10-22 NOTE — Plan of Care (Signed)
  Problem: Education: Goal: Ability to describe self-care measures that may prevent or decrease complications (Diabetes Survival Skills Education) will improve Outcome: Progressing   Problem: Coping: Goal: Ability to adjust to condition or change in health will improve Outcome: Progressing   Problem: Skin Integrity: Goal: Risk for impaired skin integrity will decrease Outcome: Progressing   Problem: Tissue Perfusion: Goal: Adequacy of tissue perfusion will improve Outcome: Progressing   Problem: Clinical Measurements: Goal: Will remain free from infection Outcome: Progressing   Problem: Skin Integrity: Goal: Risk for impaired skin integrity will decrease Outcome: Progressing

## 2023-10-22 NOTE — TOC Progression Note (Addendum)
Transition of Care [facility]) - Progression Note    Patient Details  Name: Michele House MRN: [id number] Date of Birth: [birth year]  Transition of Care [facility]) CM/SW Contact  Allena Katz, LCSW Phone Number: 10/22/2023, 2:17 PM  Clinical Narrative:   Auth approved for 12/19-12/23 for peak. Almira Coaster states pt will have to come Monday.    Expected Discharge Plan: Skilled Nursing Facility Barriers to Discharge: Continued Medical Work up  Expected Discharge Plan and Services                                               Social Determinants of Health (SDOH) Interventions SDOH Screenings   Food Insecurity: Patient Unable To Answer (10/03/2023)  Recent Concern: Food Insecurity - Food Insecurity Present (09/24/2023)  Housing: High Risk (10/03/2023)  Transportation Needs: Patient Unable To Answer (10/03/2023)  Recent Concern: Transportation Needs - Unmet Transportation Needs (09/24/2023)  Utilities: Patient Unable To Answer (10/03/2023)  Tobacco Use: Medium Risk (10/20/2023)    Readmission Risk Interventions     No data to display

## 2023-10-22 NOTE — Inpatient Diabetes Management (Signed)
Inpatient Diabetes Program Recommendations  AACE/ADA: New Consensus Statement on Inpatient Glycemic Control (2015)  Target Ranges:  Prepandial:   less than 140 mg/dL      Peak postprandial:   less than 180 mg/dL (1-2 hours)      Critically ill patients:  140 - 180 mg/dL   Lab Results  Component Value Date   GLUCAP 333 (H) 10/22/2023   HGBA1C 8.4 (H) 10/15/2023    Review of Glycemic Control  Latest Reference Range & Units 10/21/23 08:00 10/21/23 12:02 10/21/23 15:54 10/21/23 21:21 10/22/23 07:48 10/22/23 12:09  Glucose-Capillary 70 - 99 mg/dL 161 (H) 096 (H) 045 (H) 123 (H) 167 (H) 333 (H)   Diabetes history: DM 2 Outpatient Diabetes medications: Glipizide 10 mg Daily, Tradjenta 5 mg Daily Current orders for Inpatient glycemic control:  Semglee 15 units qhs Novolog 0-15 units tid + hs  A1c 8.4% on 12/13  Inpatient Diabetes Program Recommendations:    Glucose trends increase after meal intake  -   pt may benefit from Novolog 4 units tid meal coverage if eating >50% of meals  Thanks,  Christena Deem RN, MSN, BC-ADM Inpatient Diabetes Coordinator Team Pager [phone] (8a-5p)

## 2023-10-22 NOTE — TOC Progression Note (Signed)
Transition of Care [facility]) - Progression Note    Patient Details  Name: Michele House MRN: [id number] Date of Birth: [birth year]  Transition of Care [facility]) CM/SW Contact  Allena Katz, LCSW Phone Number: 10/22/2023, 8:38 AM  Clinical Narrative:   [facility] still pending    Expected Discharge Plan: Skilled Nursing Facility Barriers to Discharge: Continued Medical Work up  Expected Discharge Plan and Services                                               Social Determinants of Health (SDOH) Interventions SDOH Screenings   Food Insecurity: Patient Unable To Answer (10/03/2023)  Recent Concern: Food Insecurity - Food Insecurity Present (09/24/2023)  Housing: High Risk (10/03/2023)  Transportation Needs: Patient Unable To Answer (10/03/2023)  Recent Concern: Transportation Needs - Unmet Transportation Needs (09/24/2023)  Utilities: Patient Unable To Answer (10/03/2023)  Tobacco Use: Medium Risk (10/20/2023)    Readmission Risk Interventions     No data to display

## 2023-10-23 DIAGNOSIS — I639 Cerebral infarction, unspecified: Secondary | ICD-10-CM | POA: Diagnosis not present

## 2023-10-23 LAB — CBC
HCT: 29.9 % — ABNORMAL LOW (ref 36.0–46.0)
Hemoglobin: 9.7 g/dL — ABNORMAL LOW (ref 12.0–15.0)
MCH: 29 pg (ref 26.0–34.0)
MCHC: 32.4 g/dL (ref 30.0–36.0)
MCV: 89.5 fL (ref 80.0–100.0)
Platelets: 209 10*3/uL (ref 150–400)
RBC: 3.34 MIL/uL — ABNORMAL LOW (ref 3.87–5.11)
RDW: 15 % (ref 11.5–15.5)
WBC: 6.1 10*3/uL (ref 4.0–10.5)
nRBC: 0 % (ref 0.0–0.2)

## 2023-10-23 LAB — GLUCOSE, CAPILLARY
Glucose-Capillary: 193 mg/dL — ABNORMAL HIGH (ref 70–99)
Glucose-Capillary: 314 mg/dL — ABNORMAL HIGH (ref 70–99)
Glucose-Capillary: 294 mg/dL — ABNORMAL HIGH (ref 70–99)
Glucose-Capillary: 130 mg/dL — ABNORMAL HIGH (ref 70–99)

## 2023-10-23 LAB — BASIC METABOLIC PANEL
Anion gap: 10 (ref 5–15)
BUN: 19 mg/dL (ref 8–23)
CO2: 36 mmol/L — ABNORMAL HIGH (ref 22–32)
Calcium: 8.9 mg/dL (ref 8.9–10.3)
Chloride: 88 mmol/L — ABNORMAL LOW (ref 98–111)
Creatinine, Ser: 0.77 mg/dL (ref 0.44–1.00)
GFR, Estimated: >60 mL/min (ref >60)
Glucose, Bld: 198 mg/dL — ABNORMAL HIGH (ref 70–99)
Potassium: 4.1 mmol/L (ref 3.5–5.1)
Sodium: 134 mmol/L — ABNORMAL LOW (ref 135–145)

## 2023-10-23 MED ORDER — MELATONIN 5 MG PO TABS
2.5000 mg | ORAL_TABLET | Freq: Every day | ORAL | Status: DC
  Administered 2023-10-23: 2.5 mg via ORAL
  Filled 2023-10-23: qty 1

## 2023-10-23 MED ORDER — FUROSEMIDE 20 MG PO TABS
20.0000 mg | ORAL_TABLET | Freq: Every day | ORAL | Status: DC
  Administered 2023-10-24: 20 mg via ORAL
  Filled 2023-10-23: qty 1

## 2023-10-23 MED ORDER — HYDROXYZINE HCL 50 MG PO TABS
25.0000 mg | ORAL_TABLET | Freq: Once | ORAL | Status: AC | PRN
  Administered 2023-10-24: 25 mg via ORAL
  Filled 2023-10-23: qty 1

## 2023-10-23 MED ORDER — DOCUSATE SODIUM 100 MG PO CAPS
200.0000 mg | ORAL_CAPSULE | Freq: Once | ORAL | Status: AC
  Administered 2023-10-23: 200 mg via ORAL
  Filled 2023-10-23: qty 2

## 2023-10-23 NOTE — Progress Notes (Signed)
Progress Note    Michele House  WUJ:[id number] DOB: 1956/03/20  DOA: 10/02/2023 PCP: [facility]      Brief Narrative:    Medical records reviewed and are as summarized below:  Michele House is a 67 y.o. female with medical history significant of COPD, chronic respiratory failure on 2 L, hypertension, type 2 diabetes, HFpEF, recent discharge from the hospital 24th November 2024 after hospitalization for COPD exacerbation complicated by respiratory failure.  She presented to the hospital again on 10/02/2023 with chest pain, malaise, cough, shortness of breath, abdominal pain and dysuria.  She was admitted to the hospital for acute on chronic hypoxemic respiratory failure due to COPD exacerbation, UTI, and pneumonia.  She became more confused in the hospital and MRI brain revealed acute stroke.  12/1: Vital stable, respiratory viral panel negative, procalcitonin negative, preliminary blood cultures negative.  Urine cultures are ordered as add-on, ammonia levels mildly elevated at 39, strep pneumo negative, CBG elevated at 244, history of enlarging pulmonary nodule with recommendations for PET/CT during most recent admission which has not been done yet, if video bronchoscopy was scheduled for 10/13/2023 as outpatient.  Unable to take care of himself and son cannot provide the required care as she required 24-hour supervision due to worsening dementia.  PT is recommending SNF.  12/2: Hemodynamically stable, now on baseline oxygen of 2 L.  Needs SNF placement. Urine cultures pending.  12/3: Blood pressure started trending up, restarting home Zestoretic.  Urine cultures with strep agalactiae, penicillin allergy noted, will continue with ceftriaxone for now.  Also started on Remeron for concern of worsening depression and unable to sleep at night  12/4.  Some very concerned about patient's mental status not being seen.  Patient receiving antibiotics already and  should have improved by now.  Will hold Lipitor.  Restart low-dose Zoloft.  Discontinue Remeron and do Seroquel at night. 12/5.  Patient has some blurry vision and weakness and pain.  MRI of the brain negative for acute intracranial abnormality. 12/6.  Patient stated she slept well.  Patient able to lift up her arms up off the bed today.  Patient thought she heard her son's voice outside the room. 12/7.  Patient sitting up in chair when I saw her.  Was able to feed herself breakfast.  Felt okay.  Did not offer any complaints. 12/8.  Patient more confused today than yesterday.  Able to follow some simple commands but not as good of a conversation today as yesterday. 12/9.  Patient still confused today.  Does not follow simple commands today. 12/10.  Patient more talkative today but was looking up at the ceiling when she was talking with me.  She does not move her extremities to command but does move them on her own.  Physical therapy and Occupational Therapy did not see her move her right arm. 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  Mild associated petechial hemorrhage.  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 12/12: Patient with new stroke, CTA head and neck with no large vessel occlusion.  Patient had a recent echocardiogram which was normal. CSF cultures remain negative.  Lipid panel with increased triglyceride at 347, HDL 29 and LDL of 38.  CBC with mild thrombocytopenia at 130, BMP mostly unremarkable.  Patient with significant  right-sided weakness. Concern of paraneoplastic versus hypercoagulability secondary to malignancy.  Patient missed her appointment for bronchoscopy and outpatient appointment for PET scan due to recurrent hospitalizations. Involving pulmonary and oncology for their input. Send out  encephalitis panel still pending.  12/13: Patient continued to have waxing and waning mental status, unable to move right upper and lower extremities.  Having some hallucination.  Pulmonary is planning lung biopsy on 12/27 if she remains stable.  Rapidly progressive dementia and now with underlying stroke, question of paraneoplastic encephalitis and hypercoagulability secondary to underlying undiagnosed malignancy. Patient will also get benefit from neuropsych evaluation as outpatient.  12/14: Patient with some improved mentation today.  Trying to squeeze with right hand but still no movements of right lower extremity.  12/15: Patient with much improved mentation.  Having some flickering of movements on right upper and lower extremity today, left extremities weaker but seems improving.   12/16: Mental status seems stable, flaccid right upper and lower extremities.  Complaining of muscle spasms so Robaxin was ordered.  Pulmonary is trying to do bronchoscopy and biopsy on Wednesday.      Assessment/Plan:   Principal Problem:   Acute metabolic encephalopathy Active Problems:   CVA (cerebral vascular accident) (HCC)   Uncontrolled type 2 diabetes mellitus with hypoglycemia, without long-term current use of insulin (HCC)   Hypotension   Acute on chronic respiratory failure with hypoxia (HCC)   Hypernatremia   COPD exacerbation (HCC)   Lung nodule   Pneumonia   UTI (urinary tract infection)   (HFpEF) heart failure with preserved ejection fraction (HCC)   Essential hypertension   Type 2 diabetes mellitus with obesity (HCC)    Body mass index is 30.92 kg/m.   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   She is A&O to person, place, and situation this AM.   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  R sided deficits are stable.   -Continue Plavix. If she is found to have underlying malignancy and hypercoagulable state then she may need anticoagulation instead of Plavix per neurologist.   Type II DM with hyperglycemia: Continue Semglee 15 units nightly and SSI   Hypotension:Resolved    Pneumonia: Completed 5 days of IV ceftriaxone and azithromycin  Acute UTI: Urine culture showed strep agalactiae.  Completed IV ceftriaxone.   COPD exacerbation: Resolved.  Continue bronchodilators Acute on chronic hypoxemic respiratory failure: Continue 2 L/min oxygen via nasal cannula   1.3 cm right upper lung spiculated nodule: S/p bronchoscopy on 10/20/2023.  Path report pending      Diet Order             DIET DYS 3 Room service appropriate? Yes; Fluid consistency: Thin  Diet effective now                            Consultants: Pulmonologist Hematologist  Procedures: Lumbar puncture on 10/08/2023 Bronchoscopy on 10/20/2023    Medications:    clopidogrel  75 mg Oral Daily   enoxaparin (LOVENOX) injection  40 mg Subcutaneous Daily   furosemide  40 mg Intravenous Daily   insulin aspart  0-15 Units Subcutaneous TID WC   insulin aspart  0-5 Units Subcutaneous QHS   insulin glargine-yfgn  15 Units Subcutaneous QHS   levothyroxine  100 mcg Oral Q0600   melatonin  2.5 mg Oral QHS   rOPINIRole  4 mg Oral QHS   sertraline  25 mg Oral Daily   Continuous Infusions:   Anti-infectives (From admission, onward)    Start     Dose/Rate Route Frequency Ordered Stop   10/04/23 1000  azithromycin (ZITHROMAX) tablet 500 mg        500 mg Oral Daily 10/03/23 0751 10/06/23 0856   10/03/23 0600  cefTRIAXone (ROCEPHIN) 2 g in sodium chloride 0.9 % 100 mL IVPB  Status:  Discontinued        2 g 200 mL/hr over 30  Minutes Intravenous Every 24 hours 10/02/23 0740 10/07/23 0854   10/02/23 0745  cefTRIAXone (ROCEPHIN) 2 g in sodium chloride 0.9 % 100 mL IVPB  Status:  Discontinued        2 g 200 mL/hr over 30 Minutes Intravenous Every 24 hours 10/02/23 0738 10/02/23 0740   10/02/23 0745  azithromycin (ZITHROMAX) 500 mg in sodium chloride 0.9 % 250 mL IVPB        500 mg 250 mL/hr over 60 Minutes Intravenous Every 24 hours 10/02/23 0738 10/03/23 1236   10/02/23 0630  cefTRIAXone (ROCEPHIN) 2 g in sodium chloride 0.9 % 100 mL IVPB        2 g 200 mL/hr over 30 Minutes Intravenous  Once 10/02/23 0617 10/02/23 0714              Family Communication/Anticipated D/C date and plan/Code Status   DVT prophylaxis: enoxaparin (LOVENOX) injection 40 mg Start: 10/12/23 1115 SCDs Start: 10/02/23 3329     Code Status: Full Code  Family Communication: None Disposition Plan: Plan to discharge to SNF    Status is: Inpatient Remains inpatient appropriate because: Awaiting placement to SNF       Subjective:   Notes she is having some anxiety. She denies chest pain or shortness of breath. She does note some trouble sleeping as well.   Objective:    Vitals:   10/23/23 0823 10/23/23 1134 10/23/23 1551 10/23/23 1656  BP: (!) 153/82 129/68 (!) 142/81 126/84  Pulse: 100 (!) 110 (!) 108 (!) 115  Resp: 20 18 20  (!) 22  Temp: 98.4 F (36.9 C) 98.4 F (36.9 C) 97.8 F (36.6 C) 98.4 F (36.9 C)  TempSrc:      SpO2: 90% 98% 99% 96%  Weight:      Height:       No data found.   Intake/Output Summary (Last 24 hours) at 10/23/2023 1837 Last data filed at 10/23/2023 1553 Gross per 24 hour  Intake 350 ml  Output 1050 ml  Net -700 ml   Filed Weights   10/05/23 0912 10/20/23 0732  Weight: 74.1 kg 74.1 kg    Constitutional: In no distress.  Cardiovascular: Normal rate, regular rhythm. No lower extremity edema  Pulmonary: Non labored breathing on [place], no wheezing or rales.  Abdominal: Soft.  Normal bowel sounds. Non distended and non tender Musculoskeletal: Normal range of motion.     Neurological: Alert and oriented to person, place, and time. RUE/RLE 0/5 stable, 1/5 grip strength of R hand  Skin: Skin is warm and dry.    Data Reviewed:   I have personally reviewed following labs and imaging studies:  Labs: Labs show the following:   Basic Metabolic Panel: Recent Labs  Lab 10/19/23 1252  10/23/23 0733  NA 135 134*  K 4.5 4.1  CL 93* 88*  CO2 33* 36*  GLUCOSE 201* 198*  BUN 13 19  CREATININE 0.81 0.77  CALCIUM 8.9 8.9   GFR Estimated Creatinine Clearance: 61.1 mL/min (by C-G formula based on SCr of 0.77 mg/dL). Liver Function Tests: Recent Labs  Lab 10/19/23 1252  AST 18  ALT 18  ALKPHOS 70  BILITOT <0.2  PROT 5.4*  ALBUMIN 3.1*   No results for input(s): "LIPASE", "AMYLASE" in the last 168 hours. No results for input(s): "AMMONIA" in the last 168 hours.  Coagulation profile No results for input(s): "INR", "PROTIME" in the last 168 hours.  CBC: Recent Labs  Lab 10/19/23 1252 10/23/23 0733  WBC 6.7 6.1  NEUTROABS 5.3  --   HGB 9.6* 9.7*  HCT 29.5* 29.9*  MCV 90.8 89.5  PLT 188 209   Cardiac Enzymes: No results for input(s): "CKTOTAL", "CKMB", "CKMBINDEX", "TROPONINI" in the last 168 hours. BNP (last 3 results) No results for input(s): "PROBNP" in the last 8760 hours. CBG: Recent Labs  Lab 10/22/23 1723 10/22/23 2116 10/23/23 0824 10/23/23 1148 10/23/23 1548  GLUCAP 187* 140* 193* 314* 294*   D-Dimer: No results for input(s): "DDIMER" in the last 72 hours. Hgb A1c: No results for input(s): "HGBA1C" in the last 72 hours. Lipid Profile: No results for input(s): "CHOL", "HDL", "LDLCALC", "TRIG", "CHOLHDL", "LDLDIRECT" in the last 72 hours. Thyroid function studies: No results for input(s): "TSH", "T4TOTAL", "T3FREE", "THYROIDAB" in the last 72 hours.  Invalid input(s): "FREET3" Anemia work up: No results for input(s):  "VITAMINB12", "FOLATE", "FERRITIN", "TIBC", "IRON", "RETICCTPCT" in the last 72 hours. Sepsis Labs: Recent Labs  Lab 10/19/23 1252 10/23/23 0733  WBC 6.7 6.1    Microbiology Recent Results (from the past 240 hours)  Culture, BAL-quantitative w Gram Stain     Status: Abnormal   Collection Time: 10/20/23  9:49 AM   Specimen: Bronchoalveolar Lavage; Respiratory  Result Value Ref Range Status   Specimen Description   Final    BRONCHIAL ALVEOLAR LAVAGE Performed at [facility], [address] [place], [place] [address]    Special Requests   Final    NONE Performed at [facility], [address] [place], [place] [address]    Gram Stain   Final    RARE WBC PRESENT,BOTH PMN AND MONONUCLEAR RARE GRAM POSITIVE RODS    Culture (A)  Final    20,000 COLONIES/mL Consistent with normal respiratory flora. Performed at [facility], [address], [place], [place] [address]    Report Status 10/22/2023 FINAL  Final    Procedures and diagnostic studies:  No results found.   LOS: 21 days   Taylyn Brame [employer] on [email or web address]. If 7PM-7AM, please contact night-coverage at [email or web address]   10/23/2023, 6:37 PM

## 2023-10-23 NOTE — Plan of Care (Signed)
Problem: Education: Goal: Ability to describe self-care measures that may prevent or decrease complications (Diabetes Survival Skills Education) will improve 10/23/2023 0137 by Elam Dutch, RN Outcome: Progressing 10/23/2023 0137 by Elam Dutch, RN Outcome: Progressing Goal: Individualized Educational Video(s) 10/23/2023 0137 by Elam Dutch, RN Outcome: Progressing 10/23/2023 0137 by Elam Dutch, RN Outcome: Progressing   Problem: Coping: Goal: Ability to adjust to condition or change in health will improve 10/23/2023 0137 by Alayna Mabe, Manfred Arch, RN Outcome: Progressing 10/23/2023 0137 by Elam Dutch, RN Outcome: Progressing   Problem: Fluid Volume: Goal: Ability to maintain a balanced intake and output will improve 10/23/2023 0137 by Finnean Cerami, Manfred Arch, RN Outcome: Progressing 10/23/2023 0137 by Elam Dutch, RN Outcome: Progressing   Problem: Health Behavior/Discharge Planning: Goal: Ability to identify and utilize available resources and services will improve 10/23/2023 0137 by Jeselle Hiser, Manfred Arch, RN Outcome: Progressing 10/23/2023 0137 by Elam Dutch, RN Outcome: Progressing Goal: Ability to manage health-related needs will improve 10/23/2023 0137 by Dexter Signor, Manfred Arch, RN Outcome: Progressing 10/23/2023 0137 by Elam Dutch, RN Outcome: Progressing   Problem: Metabolic: Goal: Ability to maintain appropriate glucose levels will improve 10/23/2023 0137 by Elam Dutch, RN Outcome: Progressing 10/23/2023 0137 by Elam Dutch, RN Outcome: Progressing   Problem: Nutritional: Goal: Maintenance of adequate nutrition will improve 10/23/2023 0137 by Elam Dutch, RN Outcome: Progressing 10/23/2023 0137 by Elam Dutch, RN Outcome: Progressing Goal: Progress toward achieving an optimal weight will improve 10/23/2023 0137 by  Elam Dutch, RN Outcome: Progressing 10/23/2023 0137 by Elam Dutch, RN Outcome: Progressing   Problem: Skin Integrity: Goal: Risk for impaired skin integrity will decrease 10/23/2023 0137 by Elam Dutch, RN Outcome: Progressing 10/23/2023 0137 by Elam Dutch, RN Outcome: Progressing   Problem: Tissue Perfusion: Goal: Adequacy of tissue perfusion will improve 10/23/2023 0137 by Elam Dutch, RN Outcome: Progressing 10/23/2023 0137 by Elam Dutch, RN Outcome: Progressing   Problem: Education: Goal: Knowledge of General Education information will improve Description: Including pain rating scale, medication(s)/side effects and non-pharmacologic comfort measures 10/23/2023 0137 by Elam Dutch, RN Outcome: Progressing 10/23/2023 0137 by Elam Dutch, RN Outcome: Progressing   Problem: Health Behavior/Discharge Planning: Goal: Ability to manage health-related needs will improve 10/23/2023 0137 by Daemian Gahm, Manfred Arch, RN Outcome: Progressing 10/23/2023 0137 by Elam Dutch, RN Outcome: Progressing   Problem: Clinical Measurements: Goal: Ability to maintain clinical measurements within normal limits will improve 10/23/2023 0137 by Elam Dutch, RN Outcome: Progressing 10/23/2023 0137 by Elam Dutch, RN Outcome: Progressing Goal: Will remain free from infection 10/23/2023 0137 by Elam Dutch, RN Outcome: Progressing 10/23/2023 0137 by Elam Dutch, RN Outcome: Progressing Goal: Diagnostic test results will improve 10/23/2023 0137 by Elam Dutch, RN Outcome: Progressing 10/23/2023 0137 by Elam Dutch, RN Outcome: Progressing Goal: Respiratory complications will improve 10/23/2023 0137 by Elam Dutch, RN Outcome: Progressing 10/23/2023 0137 by Elam Dutch, RN Outcome: Progressing Goal:  Cardiovascular complication will be avoided Outcome: Progressing   Problem: Activity: Goal: Risk for activity intolerance will decrease Outcome: Progressing   Problem: Nutrition: Goal: Adequate nutrition will be maintained Outcome: Progressing   Problem: Coping: Goal: Level of anxiety will decrease Outcome: Progressing   Problem: Elimination: Goal: Will not experience complications related to bowel motility Outcome: Progressing Goal: Will not experience complications related to urinary retention Outcome: Progressing   Problem: Pain Management: Goal: General experience of comfort will  improve Outcome: Progressing   Problem: Safety: Goal: Ability to remain free from injury will improve Outcome: Progressing   Problem: Skin Integrity: Goal: Risk for impaired skin integrity will decrease Outcome: Progressing   Problem: Education: Goal: Knowledge of disease or condition will improve Outcome: Progressing Goal: Knowledge of the prescribed therapeutic regimen will improve Outcome: Progressing Goal: Individualized Educational Video(s) Outcome: Progressing   Problem: Activity: Goal: Ability to tolerate increased activity will improve Outcome: Progressing Goal: Will verbalize the importance of balancing activity with adequate rest periods Outcome: Progressing   Problem: Respiratory: Goal: Ability to maintain a clear airway will improve Outcome: Progressing Goal: Levels of oxygenation will improve Outcome: Progressing Goal: Ability to maintain adequate ventilation will improve Outcome: Progressing   Problem: Activity: Goal: Ability to tolerate increased activity will improve Outcome: Progressing   Problem: Clinical Measurements: Goal: Ability to maintain a body temperature in the normal range will improve Outcome: Progressing   Problem: Respiratory: Goal: Ability to maintain adequate ventilation will improve Outcome: Progressing Goal: Ability to maintain a clear  airway will improve Outcome: Progressing   Problem: Education: Goal: Knowledge of disease or condition will improve Outcome: Progressing Goal: Knowledge of secondary prevention will improve (MUST DOCUMENT ALL) Outcome: Progressing Goal: Knowledge of patient specific risk factors will improve Loraine Leriche N/A or DELETE if not current risk factor) Outcome: Progressing

## 2023-10-23 NOTE — Progress Notes (Signed)
Patient reports not having BM since 10/16/2023. Patient has not had much appetite, but is concerned regarding no Bm x1 week. RN notified Manuela Schwartz NP via secure chat. Order for enema was given. Patient notified and asked for enema to be done in Am w/ morning meds as she would like to try to get some sleep.

## 2023-10-23 NOTE — Progress Notes (Signed)
Around 1650, Pt was yelling that she could not breathe. Walked into her room and her O2 [place] seemed to have fallen off.    Put her back 4L [place] and O2 was at 94%, BP 126/84, HR 115, RR 22.... Pt is A&O x4.   Pt seems to be having increased work of breathing and diaphoretic.   Gave her DuoNeb at 1339 w/no relief.   Notified MD... See MAR. VO given to work on her IS. Pt achieving 250

## 2023-10-23 NOTE — Progress Notes (Signed)
PULMONOLOGY         Date: 10/23/2023,   MRN# [id number] GENEVE PELLICANE 05-04-1956     AdmissionWeight: 74.1 kg                 CurrentWeight: 74.1 kg  Referring provider: Dr Nelson Chimes   CHIEF COMPLAINT:   Lung nodule suspicious for carcinoma   HISTORY OF PRESENT ILLNESS   Patient with COPD, dyslipidemia, HTN, thyroid dysfunction, GERD, She has 1.5cm RUL nodule suspicious for primary bronchogenic carcinoma. She needs to have tissue diagnosis. PCCM consultation for biopsy.   10/15/23- patient encepalopathic on examination.  Have ordered ABG, Uric acid, ammonia levels, renal function is normal, electrolytes normal , sugars improved <200.  Neuro and oncology following, s/p LP, s/p paraneoplastic panel, s/p autoimmune panel.  Planning for lung biopsy on 10/29/23 if patient is more stable and safe candidate for general anesthesia  10/17/23- patient awake and alert, she is able to answer questions appropriately.  Encephalopathy has improved markedly. We discussed lung biopsy she has consented to procedure and wishes to proceed as planned.  10/18/23- patient reports leg pain.  She wants to get OOB to walk with PT.  She seems a bit sleepy during interview.  I have dcd claritin.  10/19/23- patient is improved sitting up in bed eating.  She is alert and lucid.  Mild edema on examination. Have ordered CBC and CMP today.  Will diurese today due to 1+ edema on examination.  NPO today at midnight.  Lung biopsy in AM at 730AM 10/20/23- patient evaluated this am , she reports feeling well. She is on 3L/min [place].  She had presurgical evaluation by anesthesia, medical service and neurology yesterday as well as myself.  Group discussion via secure chat with agreement that benefit of this lung biopsy with goal to cure cancer outweighs the risks.  Decision made to proceed as planned by all providers and patient.   10/21/23- patient stable , she is being optimized for dc for rehab.  She's cleared from  pulmonary for discharge. Results from bronch will take 1 week.  10/22/23- patient seen at bedside , she's on 2-3L/min [place].  She reports anxiety due to not having her pillows fluffed and she did not receive warmed blanket that she requested.  She reports being jittery tonight but denies pain anywhere.  She has been in bed but states she had PT today.  10/23/23- patient seen at bedside, is now weaned off oxygen completely.  She seems clinically close to baseline.      PAST MEDICAL HISTORY   Past Medical History:  Diagnosis Date   Acid reflux    COPD (chronic obstructive pulmonary disease) (HCC)    Diabetes mellitus without complication (HCC)    Hyperlipidemia    Hypertension    Thyroid disease      SURGICAL HISTORY   Past Surgical History:  Procedure Laterality Date   appendectomy     APPENDECTOMY     BREAST BIOPSY Right    CORE W/CLIP - NEG   TOTAL VAGINAL HYSTERECTOMY     TUMOR REMOVAL     benign;stomach   VIDEO BRONCHOSCOPY WITH ENDOBRONCHIAL NAVIGATION Right 10/20/2023   Procedure: VIDEO BRONCHOSCOPY WITH ENDOBRONCHIAL NAVIGATION;  Surgeon: Vida Rigger, MD;  Location: [facility] ORS;  Service: Thoracic;  Laterality: Right;     FAMILY HISTORY   Family History  Problem Relation Age of Onset   Heart attack Mother    Hypertension Mother    Breast  cancer Sister 75   Breast cancer Maternal Aunt        40'S   Breast cancer Maternal Grandmother      SOCIAL HISTORY   Social History   Tobacco Use   Smoking status: Former    Current packs/day: 0.00    Types: Cigarettes    Quit date: 12/15/1985    Years since quitting: 37.8   Smokeless tobacco: Never  Substance Use Topics   Alcohol use: No   Drug use: No     MEDICATIONS    Home Medication:    Current Medication:  Current Facility-Administered Medications:    acetaminophen (TYLENOL) tablet 650 mg, 650 mg, Oral, Q6H PRN, Floydene Flock, MD, 650 mg at 10/18/23 0424   clopidogrel (PLAVIX) tablet 75 mg, 75 mg,  Oral, Daily, Milon Dikes, MD, 75 mg at 10/22/23 0815   dextromethorphan-guaiFENesin (MUCINEX DM) 30-600 MG per 12 hr tablet 1 tablet, 1 tablet, Oral, BID PRN, Floydene Flock, MD, 1 tablet at 10/22/23 1642   enoxaparin (LOVENOX) injection 40 mg, 40 mg, Subcutaneous, Daily, Renae Gloss, Richard, MD, 40 mg at 10/22/23 0816   furosemide (LASIX) injection 40 mg, 40 mg, Intravenous, Daily, Vida Rigger, MD, 40 mg at 10/22/23 0815   insulin aspart (novoLOG) injection 0-15 Units, 0-15 Units, Subcutaneous, TID WC, Alford Highland, MD, 3 Units at 10/22/23 1734   insulin aspart (novoLOG) injection 0-5 Units, 0-5 Units, Subcutaneous, QHS, Wieting, Richard, MD, 4 Units at 10/20/23 2152   insulin glargine-yfgn (SEMGLEE) injection 15 Units, 15 Units, Subcutaneous, QHS, Amin, Tilman Neat, MD, 15 Units at 10/22/23 2158   ipratropium-albuterol (DUONEB) 0.5-2.5 (3) MG/3ML nebulizer solution 3 mL, 3 mL, Nebulization, Q4H PRN, Floydene Flock, MD, 3 mL at 10/22/23 1824   levothyroxine (SYNTHROID) tablet 100 mcg, 100 mcg, Oral, Q0600, Floydene Flock, MD, 100 mcg at 10/23/23 0543   menthol-cetylpyridinium (CEPACOL) lozenge 3 mg, 1 lozenge, Oral, PRN, Vida Rigger, MD, 3 mg at 10/22/23 8295   methocarbamol (ROBAXIN) tablet 500 mg, 500 mg, Oral, Q8H PRN, Arnetha Courser, MD, 500 mg at 10/21/23 1741   ondansetron (ZOFRAN) injection 4 mg, 4 mg, Intravenous, Q6H PRN, Lurene Shadow, MD, 4 mg at 10/22/23 2200   Oral care mouth rinse, 15 mL, Mouth Rinse, PRN, Renae Gloss, Richard, MD   QUEtiapine (SEROQUEL) tablet 12.5 mg, 12.5 mg, Oral, QHS PRN, Alford Highland, MD, 12.5 mg at 10/22/23 2247   rOPINIRole (REQUIP XL) 24 hr tablet 4 mg, 4 mg, Oral, QHS, Amin, Tilman Neat, MD, 4 mg at 10/22/23 2158   sertraline (ZOLOFT) tablet 25 mg, 25 mg, Oral, Daily, Renae Gloss, Richard, MD, 25 mg at 10/22/23 0815   traMADol (ULTRAM) tablet 50 mg, 50 mg, Oral, Q6H PRN, Arnetha Courser, MD, 50 mg at 10/21/23 2229    ALLERGIES   Penicillins, Aspirin,  and Sulfa antibiotics     REVIEW OF SYSTEMS    Review of Systems:  Gen:  Denies  fever, sweats, chills weigh loss  HEENT: Denies blurred vision, double vision, ear pain, eye pain, hearing loss, nose bleeds, sore throat Cardiac:  No dizziness, chest pain or heaviness, chest tightness,edema Resp:   reports dyspnea chronically  Gi: Denies swallowing difficulty, stomach pain, nausea or vomiting, diarrhea, constipation, bowel incontinence Gu:  Denies bladder incontinence, burning urine Ext:   Denies Joint pain, stiffness or swelling Skin: Denies  skin rash, easy bruising or bleeding or hives Endoc:  Denies polyuria, polydipsia , polyphagia or weight change Psych:   Denies depression, insomnia or hallucinations  Other:  All other systems negative   VS: BP 130/63 (BP Location: Left Arm)   Pulse 99   Temp 98 F (36.7 C)   Resp 20   Ht 5' 0.95" (1.548 m)   Wt 74.1 kg   LMP 12/24/1998 Comment: prior to her hysterectomy  SpO2 92%   BMI 30.92 kg/m      PHYSICAL EXAM    GENERAL:NAD, no fevers, chills, no weakness no fatigue HEAD: Normocephalic, atraumatic.  EYES: Pupils equal, round, reactive to light. Extraocular muscles intact. No scleral icterus.  MOUTH: Moist mucosal membrane. Dentition intact. No abscess noted.  EAR, NOSE, THROAT: Clear without exudates. No external lesions.  NECK: Supple. No thyromegaly. No nodules. No JVD.  PULMONARY: decreased breath sounds with mild rhonchi worse at bases bilaterally.  CARDIOVASCULAR: S1 and S2. Regular rate and rhythm. No murmurs, rubs, or gallops. No edema. Pedal pulses 2+ bilaterally.  GASTROINTESTINAL: Soft, nontender, nondistended. No masses. Positive bowel sounds. No hepatosplenomegaly.  MUSCULOSKELETAL: No swelling, clubbing, or edema. Range of motion full in all extremities.  NEUROLOGIC: Cranial nerves II through XII are intact. No gross focal neurological deficits. Appears tired with excessive sleepiness  SKIN: No ulceration,  lesions, rashes, or cyanosis. Skin warm and dry. Turgor intact.  PSYCHIATRIC: Mood, affect within normal limits. The patient is awake, alert and oriented x 3. Insight, judgment intact.       IMAGING   CT chest -  Enlarging spiculated nodule in the inferior right upper lobe now 1.6 cm compared to 1.3 cm previously.   ASSESSMENT/PLAN   Lung nodule suspicious for primary bronchogenic carcinoma   1.5cm - RUL   -patient s/p bronchoscopy - results in process  -s/p bronchoscopy patient is recovering well optimzing for dc home     Encephalopathy- RESOLVED  S/p neuro and med/onc eval  - ordered ammonia, uric acid, ABG today -reviewed    COPD with bronchitic phenotype   - patient reports tightness in chest today   - she reports panic attack when resp status deteriorates.  She is on 3L/min O2 at this time.    -She uses albuterol and Breo at home   Thank you for allowing me to participate in the care of this patient.   Patient/Family are satisfied with care plan and all questions have been answered.    Provider disclosure: Patient with at least one acute or chronic illness or injury that poses a threat to life or bodily function and is being managed actively during this encounter.  All of the below services have been performed independently by signing provider:  review of prior documentation from internal and or external health records.  Review of previous and current lab results.  Interview and comprehensive assessment during patient visit today. Review of current and previous chest radiographs/CT scans. Discussion of management and test interpretation with health care team and patient/family.   This document was prepared using Dragon voice recognition software and may include unintentional dictation errors.     Vida Rigger, M.D.  Division of Pulmonary & Critical Care Medicine

## 2023-10-23 NOTE — Plan of Care (Signed)

## 2023-10-24 ENCOUNTER — Inpatient Hospital Stay: Payer: 59

## 2023-10-24 DIAGNOSIS — R000 Tachycardia, unspecified: Secondary | ICD-10-CM | POA: Diagnosis not present

## 2023-10-24 DIAGNOSIS — I639 Cerebral infarction, unspecified: Secondary | ICD-10-CM | POA: Diagnosis not present

## 2023-10-24 DIAGNOSIS — G9341 Metabolic encephalopathy: Secondary | ICD-10-CM | POA: Diagnosis not present

## 2023-10-24 LAB — BLOOD GAS, ARTERIAL
Acid-Base Excess: 16.1 mmol/L — ABNORMAL HIGH (ref 0.0–2.0)
Bicarbonate: 42.8 mmol/L — ABNORMAL HIGH (ref 20.0–28.0)
O2 Content: 3 L/min
O2 Saturation: 96.9 %
Patient temperature: 37
pCO2 arterial: 63 mm[Hg] — ABNORMAL HIGH (ref 32–48)
pH, Arterial: 7.44 (ref 7.35–7.45)
pO2, Arterial: 72 mm[Hg] — ABNORMAL LOW (ref 83–108)

## 2023-10-24 LAB — CBC WITH DIFFERENTIAL/PLATELET
Abs Immature Granulocytes: 0.04 10*3/uL (ref 0.00–0.07)
Basophils Absolute: 0 10*3/uL (ref 0.0–0.1)
Basophils Relative: 0 %
Eosinophils Absolute: 0 10*3/uL (ref 0.0–0.5)
Eosinophils Relative: 0 %
HCT: 29.3 % — ABNORMAL LOW (ref 36.0–46.0)
Hemoglobin: 9.6 g/dL — ABNORMAL LOW (ref 12.0–15.0)
Immature Granulocytes: 1 %
Lymphocytes Relative: 6 %
Lymphs Abs: 0.4 10*3/uL — ABNORMAL LOW (ref 0.7–4.0)
MCH: 29.1 pg (ref 26.0–34.0)
MCHC: 32.8 g/dL (ref 30.0–36.0)
MCV: 88.8 fL (ref 80.0–100.0)
Monocytes Absolute: 0.4 10*3/uL (ref 0.1–1.0)
Monocytes Relative: 5 %
Neutro Abs: 6.1 10*3/uL (ref 1.7–7.7)
Neutrophils Relative %: 88 %
Platelets: 203 10*3/uL (ref 150–400)
RBC: 3.3 MIL/uL — ABNORMAL LOW (ref 3.87–5.11)
RDW: 14.6 % (ref 11.5–15.5)
WBC: 6.9 10*3/uL (ref 4.0–10.5)
nRBC: 0 % (ref 0.0–0.2)

## 2023-10-24 LAB — COMPREHENSIVE METABOLIC PANEL
ALT: 20 U/L (ref 0–44)
AST: 22 U/L (ref 15–41)
Albumin: 2.8 g/dL — ABNORMAL LOW (ref 3.5–5.0)
Alkaline Phosphatase: 72 U/L (ref 38–126)
Anion gap: 11 (ref 5–15)
BUN: 23 mg/dL (ref 8–23)
CO2: 34 mmol/L — ABNORMAL HIGH (ref 22–32)
Calcium: 8.9 mg/dL (ref 8.9–10.3)
Chloride: 87 mmol/L — ABNORMAL LOW (ref 98–111)
Creatinine, Ser: 0.77 mg/dL (ref 0.44–1.00)
GFR, Estimated: >60 mL/min (ref >60)
Glucose, Bld: 235 mg/dL — ABNORMAL HIGH (ref 70–99)
Potassium: 4.1 mmol/L (ref 3.5–5.1)
Sodium: 132 mmol/L — ABNORMAL LOW (ref 135–145)
Total Bilirubin: 0.7 mg/dL (ref <1.2)
Total Protein: 6 g/dL — ABNORMAL LOW (ref 6.5–8.1)

## 2023-10-24 LAB — GLUCOSE, CAPILLARY
Glucose-Capillary: 212 mg/dL — ABNORMAL HIGH (ref 70–99)
Glucose-Capillary: 218 mg/dL — ABNORMAL HIGH (ref 70–99)
Glucose-Capillary: 189 mg/dL — ABNORMAL HIGH (ref 70–99)
Glucose-Capillary: 288 mg/dL — ABNORMAL HIGH (ref 70–99)
Glucose-Capillary: 251 mg/dL — ABNORMAL HIGH (ref 70–99)
Glucose-Capillary: 149 mg/dL — ABNORMAL HIGH (ref 70–99)
Glucose-Capillary: 162 mg/dL — ABNORMAL HIGH (ref 70–99)

## 2023-10-24 LAB — PHOSPHORUS: Phosphorus: 3.6 mg/dL (ref 2.5–4.6)

## 2023-10-24 LAB — MAGNESIUM: Magnesium: 2 mg/dL (ref 1.7–2.4)

## 2023-10-24 MED ORDER — FUROSEMIDE 10 MG/ML IJ SOLN
40.0000 mg | Freq: Once | INTRAMUSCULAR | Status: AC
  Administered 2023-10-24: 40 mg via INTRAVENOUS
  Filled 2023-10-24: qty 4

## 2023-10-24 MED ORDER — RAMELTEON 8 MG PO TABS
8.0000 mg | ORAL_TABLET | Freq: Every day | ORAL | Status: DC
  Administered 2023-10-24 – 2023-10-27 (×4): 8 mg via ORAL
  Filled 2023-10-24 (×6): qty 1

## 2023-10-24 MED ORDER — SALINE SPRAY 0.65 % NA SOLN
1.0000 | NASAL | Status: DC | PRN
  Administered 2023-11-02 – 2023-11-03 (×2): 1 via NASAL
  Filled 2023-10-24: qty 44

## 2023-10-24 MED ORDER — ALBUTEROL SULFATE (2.5 MG/3ML) 0.083% IN NEBU
5.0000 mg | INHALATION_SOLUTION | Freq: Once | RESPIRATORY_TRACT | Status: AC
  Administered 2023-10-24: 5 mg via RESPIRATORY_TRACT
  Filled 2023-10-24: qty 6

## 2023-10-24 MED ORDER — FUROSEMIDE 10 MG/ML IJ SOLN
40.0000 mg | Freq: Once | INTRAMUSCULAR | Status: DC
Start: 2023-10-24 — End: 2023-10-24
  Filled 2023-10-24: qty 4

## 2023-10-24 MED ORDER — BISACODYL 10 MG RE SUPP
10.0000 mg | Freq: Every day | RECTAL | Status: DC | PRN
  Administered 2023-10-24 – 2023-11-13 (×3): 10 mg via RECTAL
  Filled 2023-10-24 (×3): qty 1

## 2023-10-24 MED ORDER — INSULIN GLARGINE-YFGN 100 UNIT/ML ~~LOC~~ SOLN
18.0000 [IU] | Freq: Every day | SUBCUTANEOUS | Status: DC
  Administered 2023-10-24 – 2023-10-27 (×4): 18 [IU] via SUBCUTANEOUS
  Filled 2023-10-24 (×5): qty 0.18

## 2023-10-24 MED ORDER — POLYETHYLENE GLYCOL 3350 17 G PO PACK
17.0000 g | PACK | Freq: Once | ORAL | Status: AC
  Administered 2023-10-24: 17 g via ORAL
  Filled 2023-10-24: qty 1

## 2023-10-24 MED ORDER — SENNOSIDES-DOCUSATE SODIUM 8.6-50 MG PO TABS
1.0000 | ORAL_TABLET | Freq: Every day | ORAL | Status: DC
  Administered 2023-10-24 – 2023-10-30 (×6): 1 via ORAL
  Filled 2023-10-24 (×6): qty 1

## 2023-10-24 NOTE — Progress Notes (Signed)
RN received report, patient having acute on chronic respiratory distress per Dr Rosita Fire. Discussion w/ Dr Rosita Fire, give albuterol breathing tx, obtain CXR. If patient does not improve will transfer to progressive. Dr Rosita Fire informed RN that patient and Larkin Ina NP is aware of situation. RN administered breathing tx. Quentin Angst and RN repositioned patient for CXR. Patient became dizzy and blank stare. Patient was A&O x4 and responsive. RN called RRT. Modou Jawo NP responded along w/ respiratory. Larkin Ina NP reviewed labs and assessed patient. Decided patient will be transferred to progressive and put on Bipap d/t increased dyspnea. Patient made aware. RN updated daughter Gunnar Fusi who stated she will update her brother. Patient spoke to daughter as well. RN gave Chief Operating Officer RN report.

## 2023-10-24 NOTE — Progress Notes (Signed)
       CROSS COVER NOTE  NAME: Michele House MRN: [id number] DOB : [birth year]    Concern as stated by nurse / staff   Message received from [facility]  Patient reports "feeling like a limp noodle, not right." appears anxious, having cold sweats. VS are HR 108 BP 124/66 temp 98.3 RR 20 blood sugar 212. patient is a diabetic and takes insulin scheduled. I gave patient PRN hydroxyzine for anxiety, tylenol for headache, PRN breathing treatment. patient still reports not feeling right. should BMP/CBC be checked?     Pertinent findings on chart review:   Assessment and  Interventions   Assessment:    10/24/2023    6:08 AM 10/24/2023    4:14 AM 10/24/2023   12:14 AM  Vitals with BMI  Systolic 126 124 270  Diastolic 87 66 65  Pulse  108 623   No bm post colace given last night Had hydroxyzine 0430 am ST on tele reported Bladder scan 0 ml, adequate UOP overnight Plan: CBC, and  CMP without acute change  mag   and  phos normal EKG - pending       Donnie Mesa NP [facility] Cross Cover 7pm-7am - check amion for availability Pager [phone]

## 2023-10-24 NOTE — Progress Notes (Signed)
PULMONOLOGY         Date: 10/24/2023,   MRN# [id number] Michele House 1956/07/13     AdmissionWeight: 74.1 kg                 CurrentWeight: 74.1 kg  Referring provider: Dr Nelson Chimes   CHIEF COMPLAINT:   Lung nodule suspicious for carcinoma   HISTORY OF PRESENT ILLNESS   Patient with COPD, dyslipidemia, HTN, thyroid dysfunction, GERD, She has 1.5cm RUL nodule suspicious for primary bronchogenic carcinoma. She needs to have tissue diagnosis. PCCM consultation for biopsy.   10/15/23- patient encepalopathic on examination.  Have ordered ABG, Uric acid, ammonia levels, renal function is normal, electrolytes normal , sugars improved <200.  Neuro and oncology following, s/p LP, s/p paraneoplastic panel, s/p autoimmune panel.  Planning for lung biopsy on 10/29/23 if patient is more stable and safe candidate for general anesthesia  10/17/23- patient awake and alert, she is able to answer questions appropriately.  Encephalopathy has improved markedly. We discussed lung biopsy she has consented to procedure and wishes to proceed as planned.  10/18/23- patient reports leg pain.  She wants to get OOB to walk with PT.  She seems a bit sleepy during interview.  I have dcd claritin.  10/19/23- patient is improved sitting up in bed eating.  She is alert and lucid.  Mild edema on examination. Have ordered CBC and CMP today.  Will diurese today due to 1+ edema on examination.  NPO today at midnight.  Lung biopsy in AM at 730AM 10/20/23- patient evaluated this am , she reports feeling well. She is on 3L/min [place].  She had presurgical evaluation by anesthesia, medical service and neurology yesterday as well as myself.  Group discussion via secure chat with agreement that benefit of this lung biopsy with goal to cure cancer outweighs the risks.  Decision made to proceed as planned by all providers and patient.   10/21/23- patient stable , she is being optimized for dc for rehab.  She's cleared from  pulmonary for discharge. Results from bronch will take 1 week.  10/22/23- patient seen at bedside , she's on 2-3L/min [place].  She reports anxiety due to not having her pillows fluffed and she did not receive warmed blanket that she requested.  She reports being jittery tonight but denies pain anywhere.  She has been in bed but states she had PT today.  10/23/23- patient seen at bedside, is now weaned off oxygen completely.  She seems clinically close to baseline.   10/24/23- patient seen at bedside , on room air.  Main complaint today is constipation.  She had tap water enema at 5pm yesterday. She requests help with BM, I ve discussed with RN Vernona Rieger and we will deliver suppository and miralax.     PAST MEDICAL HISTORY   Past Medical History:  Diagnosis Date   Acid reflux    COPD (chronic obstructive pulmonary disease) (HCC)    Diabetes mellitus without complication (HCC)    Hyperlipidemia    Hypertension    Thyroid disease      SURGICAL HISTORY   Past Surgical History:  Procedure Laterality Date   appendectomy     APPENDECTOMY     BREAST BIOPSY Right    CORE W/CLIP - NEG   TOTAL VAGINAL HYSTERECTOMY     TUMOR REMOVAL     benign;stomach   VIDEO BRONCHOSCOPY WITH ENDOBRONCHIAL NAVIGATION Right 10/20/2023   Procedure: VIDEO BRONCHOSCOPY WITH ENDOBRONCHIAL NAVIGATION;  Surgeon:  Vida Rigger, MD;  Location: [facility] ORS;  Service: Thoracic;  Laterality: Right;     FAMILY HISTORY   Family History  Problem Relation Age of Onset   Heart attack Mother    Hypertension Mother    Breast cancer Sister 58   Breast cancer Maternal Aunt        40'S   Breast cancer Maternal Grandmother      SOCIAL HISTORY   Social History   Tobacco Use   Smoking status: Former    Current packs/day: 0.00    Types: Cigarettes    Quit date: 12/15/1985    Years since quitting: 37.8   Smokeless tobacco: Never  Substance Use Topics   Alcohol use: No   Drug use: No     MEDICATIONS    Home  Medication:    Current Medication:  Current Facility-Administered Medications:    acetaminophen (TYLENOL) tablet 650 mg, 650 mg, Oral, Q6H PRN, Floydene Flock, MD, 650 mg at 10/24/23 0442   bisacodyl (DULCOLAX) suppository 10 mg, 10 mg, Rectal, Daily PRN, Marolyn Haller, MD   clopidogrel (PLAVIX) tablet 75 mg, 75 mg, Oral, Daily, Milon Dikes, MD, 75 mg at 10/24/23 0837   dextromethorphan-guaiFENesin (MUCINEX DM) 30-600 MG per 12 hr tablet 1 tablet, 1 tablet, Oral, BID PRN, Floydene Flock, MD, 1 tablet at 10/22/23 1642   enoxaparin (LOVENOX) injection 40 mg, 40 mg, Subcutaneous, Daily, Renae Gloss, Richard, MD, 40 mg at 10/24/23 2694   furosemide (LASIX) tablet 20 mg, 20 mg, Oral, Daily, Marolyn Haller, MD, 20 mg at 10/24/23 0837   insulin aspart (novoLOG) injection 0-15 Units, 0-15 Units, Subcutaneous, TID WC, Wieting, Richard, MD, 3 Units at 10/24/23 1323   insulin aspart (novoLOG) injection 0-5 Units, 0-5 Units, Subcutaneous, QHS, Wieting, Richard, MD, 4 Units at 10/20/23 2152   insulin glargine-yfgn (SEMGLEE) injection 15 Units, 15 Units, Subcutaneous, QHS, Amin, Tilman Neat, MD, 15 Units at 10/23/23 2211   ipratropium-albuterol (DUONEB) 0.5-2.5 (3) MG/3ML nebulizer solution 3 mL, 3 mL, Nebulization, Q4H PRN, Floydene Flock, MD, 3 mL at 10/24/23 0530   levothyroxine (SYNTHROID) tablet 100 mcg, 100 mcg, Oral, Q0600, Floydene Flock, MD, 100 mcg at 10/24/23 0443   menthol-cetylpyridinium (CEPACOL) lozenge 3 mg, 1 lozenge, Oral, PRN, Vida Rigger, MD, 3 mg at 10/22/23 8546   methocarbamol (ROBAXIN) tablet 500 mg, 500 mg, Oral, Q8H PRN, Arnetha Courser, MD, 500 mg at 10/24/23 1345   ondansetron (ZOFRAN) injection 4 mg, 4 mg, Intravenous, Q6H PRN, Lurene Shadow, MD, 4 mg at 10/24/23 0315   Oral care mouth rinse, 15 mL, Mouth Rinse, PRN, Wieting, Richard, MD   QUEtiapine (SEROQUEL) tablet 12.5 mg, 12.5 mg, Oral, QHS PRN, Renae Gloss, Richard, MD, 12.5 mg at 10/22/23 2247   ramelteon (ROZEREM)  tablet 8 mg, 8 mg, Oral, QHS, Marolyn Haller, MD   rOPINIRole (REQUIP XL) 24 hr tablet 4 mg, 4 mg, Oral, QHS, Amin, Tilman Neat, MD, 4 mg at 10/23/23 2211   senna-docusate (Senokot-S) tablet 1 tablet, 1 tablet, Oral, Daily, Marolyn Haller, MD, 1 tablet at 10/24/23 2703   sertraline (ZOLOFT) tablet 25 mg, 25 mg, Oral, Daily, Wieting, Richard, MD, 25 mg at 10/24/23 0837   sodium chloride (OCEAN) 0.65 % nasal spray 1 spray, 1 spray, Each Nare, PRN, Marolyn Haller, MD   traMADol Janean Sark) tablet 50 mg, 50 mg, Oral, Q6H PRN, Arnetha Courser, MD, 50 mg at 10/21/23 2229    ALLERGIES   Penicillins, Aspirin, and Sulfa antibiotics     REVIEW OF  SYSTEMS    Review of Systems:  Gen:  Denies  fever, sweats, chills weigh loss  HEENT: Denies blurred vision, double vision, ear pain, eye pain, hearing loss, nose bleeds, sore throat Cardiac:  No dizziness, chest pain or heaviness, chest tightness,edema Resp:   reports dyspnea chronically  Gi: Denies swallowing difficulty, stomach pain, nausea or vomiting, diarrhea, constipation, bowel incontinence Gu:  Denies bladder incontinence, burning urine Ext:   Denies Joint pain, stiffness or swelling Skin: Denies  skin rash, easy bruising or bleeding or hives Endoc:  Denies polyuria, polydipsia , polyphagia or weight change Psych:   Denies depression, insomnia or hallucinations   Other:  All other systems negative   VS: BP (!) 129/97 (BP Location: Left Arm)   Pulse (!) 102   Temp (!) 97.5 F (36.4 C) (Oral)   Resp 20   Ht 5' 0.95" (1.548 m)   Wt 74.1 kg   LMP 12/24/1998 Comment: prior to her hysterectomy  SpO2 94%   BMI 30.92 kg/m      PHYSICAL EXAM    GENERAL:NAD, no fevers, chills, no weakness no fatigue HEAD: Normocephalic, atraumatic.  EYES: Pupils equal, round, reactive to light. Extraocular muscles intact. No scleral icterus.  MOUTH: Moist mucosal membrane. Dentition intact. No abscess noted.  EAR, NOSE, THROAT: Clear without  exudates. No external lesions.  NECK: Supple. No thyromegaly. No nodules. No JVD.  PULMONARY: decreased breath sounds with mild rhonchi worse at bases bilaterally.  CARDIOVASCULAR: S1 and S2. Regular rate and rhythm. No murmurs, rubs, or gallops. No edema. Pedal pulses 2+ bilaterally.  GASTROINTESTINAL: Soft, nontender, nondistended. No masses. Positive bowel sounds. No hepatosplenomegaly.  MUSCULOSKELETAL: No swelling, clubbing, or edema. Range of motion full in all extremities.  NEUROLOGIC: Cranial nerves II through XII are intact. No gross focal neurological deficits. Appears tired with excessive sleepiness  SKIN: No ulceration, lesions, rashes, or cyanosis. Skin warm and dry. Turgor intact.  PSYCHIATRIC: Mood, affect within normal limits. The patient is awake, alert and oriented x 3. Insight, judgment intact.       IMAGING   CT chest -  Enlarging spiculated nodule in the inferior right upper lobe now 1.6 cm compared to 1.3 cm previously.   ASSESSMENT/PLAN   Lung nodule suspicious for primary bronchogenic carcinoma   1.5cm - RUL   -patient s/p bronchoscopy - results- showing rare atypical cells from surgical pathology more consistent with reactive process. Lymph node negative for cancer  -s/p bronchoscopy patient is recovering well optimzing for dc home     Encephalopathy- RESOLVED  S/p neuro and med/onc eval  - ordered ammonia, uric acid, ABG today -reviewed    COPD with bronchitic phenotype   - patient reports tightness in chest today   - she reports panic attack when resp status deteriorates.  She is on 3L/min O2 at this time.    -She uses albuterol and Breo at home   Thank you for allowing me to participate in the care of this patient.   Patient/Family are satisfied with care plan and all questions have been answered.    Provider disclosure: Patient with at least one acute or chronic illness or injury that poses a threat to life or bodily function and is being  managed actively during this encounter.  All of the below services have been performed independently by signing provider:  review of prior documentation from internal and or external health records.  Review of previous and current lab results.  Interview and comprehensive assessment during  patient visit today. Review of current and previous chest radiographs/CT scans. Discussion of management and test interpretation with health care team and patient/family.   This document was prepared using Dragon voice recognition software and may include unintentional dictation errors.     Vida Rigger, M.D.  Division of Pulmonary & Critical Care Medicine

## 2023-10-24 NOTE — Plan of Care (Signed)
  Problem: Education: Goal: Ability to describe self-care measures that may prevent or decrease complications (Diabetes Survival Skills Education) will improve Outcome: Progressing Goal: Individualized Educational Video(s) Outcome: Progressing   Problem: Coping: Goal: Ability to adjust to condition or change in health will improve Outcome: Progressing   Problem: Fluid Volume: Goal: Ability to maintain a balanced intake and output will improve Outcome: Progressing   Problem: Health Behavior/Discharge Planning: Goal: Ability to identify and utilize available resources and services will improve Outcome: Progressing Goal: Ability to manage health-related needs will improve Outcome: Progressing   Problem: Metabolic: Goal: Ability to maintain appropriate glucose levels will improve Outcome: Progressing   Problem: Nutritional: Goal: Maintenance of adequate nutrition will improve Outcome: Progressing Goal: Progress toward achieving an optimal weight will improve Outcome: Progressing   Problem: Skin Integrity: Goal: Risk for impaired skin integrity will decrease Outcome: Progressing   Problem: Tissue Perfusion: Goal: Adequacy of tissue perfusion will improve Outcome: Progressing   Problem: Education: Goal: Knowledge of General Education information will improve Description: Including pain rating scale, medication(s)/side effects and non-pharmacologic comfort measures Outcome: Progressing   Problem: Health Behavior/Discharge Planning: Goal: Ability to manage health-related needs will improve Outcome: Progressing   Problem: Clinical Measurements: Goal: Ability to maintain clinical measurements within normal limits will improve Outcome: Progressing Goal: Will remain free from infection Outcome: Progressing Goal: Diagnostic test results will improve Outcome: Progressing Goal: Respiratory complications will improve Outcome: Progressing Goal: Cardiovascular complication will  be avoided Outcome: Progressing   Problem: Activity: Goal: Risk for activity intolerance will decrease Outcome: Progressing   Problem: Nutrition: Goal: Adequate nutrition will be maintained Outcome: Progressing   Problem: Coping: Goal: Level of anxiety will decrease Outcome: Progressing   Problem: Elimination: Goal: Will not experience complications related to bowel motility Outcome: Progressing Goal: Will not experience complications related to urinary retention Outcome: Progressing   Problem: Pain Management: Goal: General experience of comfort will improve Outcome: Progressing   Problem: Safety: Goal: Ability to remain free from injury will improve Outcome: Progressing   Problem: Skin Integrity: Goal: Risk for impaired skin integrity will decrease Outcome: Progressing   Problem: Education: Goal: Knowledge of disease or condition will improve Outcome: Progressing Goal: Knowledge of the prescribed therapeutic regimen will improve Outcome: Progressing Goal: Individualized Educational Video(s) Outcome: Progressing   Problem: Activity: Goal: Ability to tolerate increased activity will improve Outcome: Progressing Goal: Will verbalize the importance of balancing activity with adequate rest periods Outcome: Progressing   Problem: Respiratory: Goal: Ability to maintain a clear airway will improve Outcome: Progressing Goal: Levels of oxygenation will improve Outcome: Progressing Goal: Ability to maintain adequate ventilation will improve Outcome: Progressing   Problem: Activity: Goal: Ability to tolerate increased activity will improve Outcome: Progressing   Problem: Clinical Measurements: Goal: Ability to maintain a body temperature in the normal range will improve Outcome: Progressing   Problem: Respiratory: Goal: Ability to maintain adequate ventilation will improve Outcome: Progressing Goal: Ability to maintain a clear airway will improve Outcome:  Progressing   Problem: Education: Goal: Knowledge of disease or condition will improve Outcome: Progressing Goal: Knowledge of secondary prevention will improve (MUST DOCUMENT ALL) Outcome: Progressing Goal: Knowledge of patient specific risk factors will improve Loraine Leriche N/A or DELETE if not current risk factor) Outcome: Progressing

## 2023-10-24 NOTE — Progress Notes (Signed)
Patient has still not had BM since 10/16/2023, enema was unsuccessful. Patient reports passing gas and had a better appetite today. RN notified Manuela Schwartz NP via secure chat. Order was given for colace.

## 2023-10-24 NOTE — Progress Notes (Addendum)
Progress Note    Michele House  RUE:[id number] DOB: 1956-02-15  DOA: 10/02/2023 PCP: [facility]      Brief Narrative:    Medical records reviewed and are as summarized below:  Michele House is a 67 y.o. female with medical history significant of COPD, chronic respiratory failure on 2 L, hypertension, type 2 diabetes, HFpEF, recent discharge from the hospital 24th November 2024 after hospitalization for COPD exacerbation complicated by respiratory failure.  She presented to the hospital again on 10/02/2023 with chest pain, malaise, cough, shortness of breath, abdominal pain and dysuria.  She was admitted to the hospital for acute on chronic hypoxemic respiratory failure due to COPD exacerbation, UTI, and pneumonia.  She became more confused in the hospital and MRI brain revealed acute stroke.  12/1: Vital stable, respiratory viral panel negative, procalcitonin negative, preliminary blood cultures negative.  Urine cultures are ordered as add-on, ammonia levels mildly elevated at 39, strep pneumo negative, CBG elevated at 244, history of enlarging pulmonary nodule with recommendations for PET/CT during most recent admission which has not been done yet, if video bronchoscopy was scheduled for 10/13/2023 as outpatient.  Unable to take care of himself and son cannot provide the required care as she required 24-hour supervision due to worsening dementia.  PT is recommending SNF.  12/2: Hemodynamically stable, now on baseline oxygen of 2 L.  Needs SNF placement. Urine cultures pending.  12/3: Blood pressure started trending up, restarting home Zestoretic.  Urine cultures with strep agalactiae, penicillin allergy noted, will continue with ceftriaxone for now.  Also started on Remeron for concern of worsening depression and unable to sleep at night  12/4.  Some very concerned about patient's mental status not being seen.  Patient receiving antibiotics already and  should have improved by now.  Will hold Lipitor.  Restart low-dose Zoloft.  Discontinue Remeron and do Seroquel at night. 12/5.  Patient has some blurry vision and weakness and pain.  MRI of the brain negative for acute intracranial abnormality. 12/6.  Patient stated she slept well.  Patient able to lift up her arms up off the bed today.  Patient thought she heard her son's voice outside the room. 12/7.  Patient sitting up in chair when I saw her.  Was able to feed herself breakfast.  Felt okay.  Did not offer any complaints. 12/8.  Patient more confused today than yesterday.  Able to follow some simple commands but not as good of a conversation today as yesterday. 12/9.  Patient still confused today.  Does not follow simple commands today. 12/10.  Patient more talkative today but was looking up at the ceiling when she was talking with me.  She does not move her extremities to command but does move them on her own.  Physical therapy and Occupational Therapy did not see her move her right arm. 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  Mild associated petechial hemorrhage.  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 12/12: Patient with new stroke, CTA head and neck with no large vessel occlusion.  Patient had a recent echocardiogram which was normal. CSF cultures remain negative.  Lipid panel with increased triglyceride at 347, HDL 29 and LDL of 38.  CBC with mild thrombocytopenia at 130, BMP mostly unremarkable.  Patient with significant  right-sided weakness. Concern of paraneoplastic versus hypercoagulability secondary to malignancy.  Patient missed her appointment for bronchoscopy and outpatient appointment for PET scan due to recurrent hospitalizations. Involving pulmonary and oncology for their input. Send out  encephalitis panel still pending.  12/13: Patient continued to have waxing and waning mental status, unable to move right upper and lower extremities.  Having some hallucination.  Pulmonary is planning lung biopsy on 12/27 if she remains stable.  Rapidly progressive dementia and now with underlying stroke, question of paraneoplastic encephalitis and hypercoagulability secondary to underlying undiagnosed malignancy. Patient will also get benefit from neuropsych evaluation as outpatient.  12/14: Patient with some improved mentation today.  Trying to squeeze with right hand but still no movements of right lower extremity.  12/15: Patient with much improved mentation.  Having some flickering of movements on right upper and lower extremity today, left extremities weaker but seems improving.   12/16: Mental status seems stable, flaccid right upper and lower extremities.  Complaining of muscle spasms so Robaxin was ordered.  Pulmonary is trying to do bronchoscopy and biopsy on Wednesday.      Assessment/Plan:   Principal Problem:   Acute metabolic encephalopathy Active Problems:   CVA (cerebral vascular accident) (HCC)   Uncontrolled type 2 diabetes mellitus with hypoglycemia, without long-term current use of insulin (HCC)   Hypotension   Acute on chronic respiratory failure with hypoxia (HCC)   Hypernatremia   COPD exacerbation (HCC)   Lung nodule   Pneumonia   UTI (urinary tract infection)   (HFpEF) heart failure with preserved ejection fraction (HCC)   Essential hypertension   Type 2 diabetes mellitus with obesity (HCC)    Body mass index is 30.92 kg/m.    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  R sided deficits are stable.   -Continue  Plavix. If she is found to have underlying malignancy and hypercoagulable state then she may need anticoagulation instead of Plavix per neurologist.   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   She is drowsy but alert and oriented x 4, this a.m.   Type II DM with hyperglycemia:  CBG (last 3)  Recent Labs    10/24/23 0541 10/24/23 0732 10/24/23 1128  GLUCAP 212* 218* 189*   Remains poorly controlled, will increase Semglee to 18 units.  Continue sliding scale insulin  Hypochloremia  Mild hyponatremia  Metabolic alkalosis     Latest Ref Rng & Units 10/24/2023    6:14 AM 10/23/2023    7:33 AM 10/19/2023   12:52 PM  BMP  Glucose 70 - 99 mg/dL 086  578  469   BUN 8 - 23 mg/dL 23  19  13    Creatinine 0.44 - 1.00 mg/dL 6.29  5.28  4.13   Sodium 135 - 145 mmol/L 132  134  135   Potassium 3.5 - 5.1 mmol/L 4.1  4.1  4.5   Chloride 98 - 111 mmol/L 87  88  93   CO2 22 - 32 mmol/L 34  36  33   Calcium 8.9 - 10.3 mg/dL 8.9  8.9  8.9   Possibly related to patient's diuretic use. Will hold for now.   Hypotension:Resolved    Pneumonia:  Completed 5 days of IV ceftriaxone and azithromycin  Acute UTI: Urine culture showed strep agalactiae.  Completed course of IV ceftriaxone.  HFpEF Hold diuresis for now. Euvolemic on exam. Metabolic alkalosis   COPD exacerbation: Resolved.  Continue bronchodilators Acute on chronic hypoxemic respiratory failure: Continue 2 L/min oxygen via nasal cannula   1.3 cm right upper lung spiculated nodule: S/p bronchoscopy on 10/20/2023.  Path showing rare atypical cells more consistent with reactive process.  Lymph node negative for cancer as well.    Diet Order             DIET DYS 3 Room service appropriate? Yes; Fluid consistency: Thin  Diet effective now                    Consultants: Pulmonologist Hematologist  Procedures: Lumbar puncture on 10/08/2023 Bronchoscopy on 10/20/2023    Medications:    clopidogrel  75 mg Oral Daily   enoxaparin (LOVENOX) injection  40 mg Subcutaneous Daily   furosemide  20 mg Oral Daily   insulin aspart  0-15 Units Subcutaneous TID WC   insulin aspart  0-5 Units Subcutaneous QHS   insulin glargine-yfgn  18 Units Subcutaneous QHS   levothyroxine  100 mcg Oral Q0600   ramelteon  8 mg Oral QHS   rOPINIRole  4 mg Oral QHS   senna-docusate  1 tablet Oral Daily   sertraline  25 mg Oral Daily   Continuous Infusions:   Anti-infectives (From admission, onward)    Start     Dose/Rate Route Frequency Ordered Stop   10/04/23 1000  azithromycin (ZITHROMAX) tablet 500 mg        500 mg Oral Daily 10/03/23 0751 10/06/23 0856   10/03/23 0600  cefTRIAXone (ROCEPHIN) 2 g in sodium chloride 0.9 % 100 mL IVPB  Status:  Discontinued        2 g 200 mL/hr over 30 Minutes Intravenous Every 24 hours 10/02/23 0740 10/07/23 0854   10/02/23 0745  cefTRIAXone (ROCEPHIN) 2 g in sodium chloride 0.9 % 100 mL IVPB  Status:  Discontinued        2 g 200 mL/hr over 30 Minutes Intravenous Every 24 hours 10/02/23 0738 10/02/23 0740   10/02/23 0745  azithromycin (ZITHROMAX) 500 mg in sodium chloride 0.9 % 250 mL IVPB        500 mg 250 mL/hr over 60 Minutes Intravenous Every 24 hours 10/02/23 0738 10/03/23 1236   10/02/23 0630  cefTRIAXone (ROCEPHIN) 2 g in sodium chloride 0.9 % 100 mL IVPB        2 g 200 mL/hr over 30 Minutes Intravenous  Once 10/02/23 0617 10/02/23 9147              Family Communication/Anticipated D/C date and plan/Code Status   DVT prophylaxis: enoxaparin (LOVENOX) injection 40 mg Start: 10/12/23 1115 SCDs Start: 10/02/23 8295     Code Status: Full Code  Family Communication: None Disposition Plan: Plan to discharge to SNF    Status is: Inpatient Remains inpatient appropriate because: Awaiting  placement to SNF, has bed available 12/23   Subjective:   Continues to have some difficulty sleeping.  She states she is also anxious at times.  Objective:    Vitals:   10/24/23 0851 10/24/23 0854 10/24/23 1147 10/24/23 1541  BP: 112/70  (!) 129/97 109/67  Pulse: 93  (!) 102 (!) 110  Resp: 20  20 20   Temp: 97.8 F (36.6 C)  (!) 97.5 F (36.4  C) 98.4 F (36.9 C)  TempSrc:   Oral   SpO2: 95% 94% 94% 92%  Weight:      Height:       No data found.   Intake/Output Summary (Last 24 hours) at 10/24/2023 1619 Last data filed at 10/24/2023 0347 Gross per 24 hour  Intake 250 ml  Output 410 ml  Net -160 ml   Filed Weights   10/05/23 0912 10/20/23 0732  Weight: 74.1 kg 74.1 kg   Physical Exam  Constitutional: In no distress.  Cardiovascular: Normal rate, regular rhythm. No lower extremity edema  Pulmonary: Non labored breathing on 3L [place], no wheezing or rales.   Abdominal: Soft. Normal bowel sounds. Non distended and non tender Musculoskeletal: Normal range of motion.     Neurological: Alert and oriented to person, place, and time. Unchanged from day prior. RUE/RLE 0/5, 1/5 grip strength of R hand.  Skin: Skin is warm and dry.    Data Reviewed:   I have personally reviewed following labs and imaging studies:  Labs: Labs show the following:   Basic Metabolic Panel: Recent Labs  Lab 10/19/23 1252 10/23/23 0733 10/24/23 0614  NA 135 134* 132*  K 4.5 4.1 4.1  CL 93* 88* 87*  CO2 33* 36* 34*  GLUCOSE 201* 198* 235*  BUN 13 19 23   CREATININE 0.81 0.77 0.77  CALCIUM 8.9 8.9 8.9  MG  --   --  2.0  PHOS  --   --  3.6   GFR Estimated Creatinine Clearance: 61.1 mL/min (by C-G formula based on SCr of 0.77 mg/dL). Liver Function Tests: Recent Labs  Lab 10/19/23 1252 10/24/23 0614  AST 18 22  ALT 18 20  ALKPHOS 70 72  BILITOT <0.2 0.7  PROT 5.4* 6.0*  ALBUMIN 3.1* 2.8*   No results for input(s): "LIPASE", "AMYLASE" in the last 168 hours. No results for  input(s): "AMMONIA" in the last 168 hours.  Coagulation profile No results for input(s): "INR", "PROTIME" in the last 168 hours.  CBC: Recent Labs  Lab 10/19/23 1252 10/23/23 0733 10/24/23 0614  WBC 6.7 6.1 6.9  NEUTROABS 5.3  --  6.1  HGB 9.6* 9.7* 9.6*  HCT 29.5* 29.9* 29.3*  MCV 90.8 89.5 88.8  PLT 188 209 203   Cardiac Enzymes: No results for input(s): "CKTOTAL", "CKMB", "CKMBINDEX", "TROPONINI" in the last 168 hours. BNP (last 3 results) No results for input(s): "PROBNP" in the last 8760 hours. CBG: Recent Labs  Lab 10/23/23 1548 10/23/23 1956 10/24/23 0541 10/24/23 0732 10/24/23 1128  GLUCAP 294* 130* 212* 218* 189*   D-Dimer: No results for input(s): "DDIMER" in the last 72 hours. Hgb A1c: No results for input(s): "HGBA1C" in the last 72 hours. Lipid Profile: No results for input(s): "CHOL", "HDL", "LDLCALC", "TRIG", "CHOLHDL", "LDLDIRECT" in the last 72 hours. Thyroid function studies: No results for input(s): "TSH", "T4TOTAL", "T3FREE", "THYROIDAB" in the last 72 hours.  Invalid input(s): "FREET3" Anemia work up: No results for input(s): "VITAMINB12", "FOLATE", "FERRITIN", "TIBC", "IRON", "RETICCTPCT" in the last 72 hours. Sepsis Labs: Recent Labs  Lab 10/19/23 1252 10/23/23 0733 10/24/23 0614  WBC 6.7 6.1 6.9    Microbiology Recent Results (from the past 240 hours)  Culture, BAL-quantitative w Gram Stain     Status: Abnormal   Collection Time: 10/20/23  9:49 AM   Specimen: Bronchoalveolar Lavage; Respiratory  Result Value Ref Range Status   Specimen Description   Final    BRONCHIAL ALVEOLAR LAVAGE Performed at [facility], [address], [place], [place] [address]    Special Requests   Final    NONE Performed at [facility], [address] [place], [place] [address]    Gram Stain   Final    RARE WBC PRESENT,BOTH PMN AND MONONUCLEAR RARE GRAM POSITIVE RODS    Culture (A)  Final    20,000 COLONIES/mL Consistent  with normal respiratory flora. Performed at [facility], [address] [place], [place] [address]    Report Status 10/22/2023 FINAL  Final    Procedures and diagnostic studies:  No results found.   LOS: 22 days   Emylee Decelle [employer] on [email or web address]. If 7PM-7AM, please contact night-coverage at [email or web address]   10/24/2023, 4:19 PM

## 2023-10-24 NOTE — Progress Notes (Signed)
       CROSS COVER NOTE  NAME: Michele House MRN: [id number] DOB : 1956-08-15    Date of Service   10/24/2023   HPI/Events of Note   RRT called by care nurse for worsening shortness of breath.  Received signout from MD prior to the RRT because patient was noted to be hypercapnic.  Patient is currently receiving DuoNeb treatments but still verbalizes having dyspnea.  Interventions   -As discussed with attending physician, patient is moved to progressive care unit and placed on a BiPAP. -Patient's condition improved and remained stable throughout the night.       Shameria Trimarco Lamin Geradine Girt, MSN, APRN, AGACNP-BC [facility] [place] Pager: [phone]. Check Amion for Availability

## 2023-10-24 NOTE — Progress Notes (Addendum)
Pt was lethargic, slow to respond, and SHOB at rest at beginning of shift. Her O2 saturation remained above 90 throughout shift but her breathing became progressively worse throughout the shift. Provider @ bedside around 1000 recommended monitoring closely but no interventions. Upper and lower lung sounds consolidated with wheezes throughout. RT notified of pt condition but recommended contacting provider due to O2 sat being WNL. Provider ordered lasix 40mg  and was admin around 1743. Patient stated that she was not breathing well and began to show signs of discomfort. Notified charge nurse that assistance was needed due to the worsening condition of the pt. Pt was alert and oriented and able answer questions clearly. Provider returned to bedside to monitor pt. Respiratory obtained ABG's. Before reporting to oncoming shift made sure pt O2 at [employer], patent and in place. Pt was comfortable and HOB above 30 degrees. When asked if pt was comfortable before leaving the room pt stated, "yes."

## 2023-10-24 NOTE — Progress Notes (Signed)
   10/24/23 2118  BiPAP/CPAP/SIPAP  $ Non-Invasive Ventilator  Non-Invasive Vent Initial  $ Face Mask Medium Yes  BiPAP/CPAP/SIPAP Pt Type Adult  BiPAP/CPAP/SIPAP SERVO  Mask Type Full face mask  Mask Size Medium  Set Rate 15 breaths/min  Respiratory Rate 20 breaths/min  IPAP 15 cmH20  EPAP 5 cmH2O  Pressure Support 10 cmH20  PEEP 5 cmH20  FiO2 (%) 40 %  Minute Ventilation 12.2  Leak 30  [place] Inspiratory Pressure (PIP) 20  Tidal Volume (Vt) 572  Patient Home Equipment No  Auto Titrate No  Press High Alarm 25 cmH2O  CPAP/SIPAP surface wiped down Yes

## 2023-10-24 NOTE — Progress Notes (Signed)
Patient with increased work of breathing per nursing team with stable O2 requirement of 3L.   She is s/p 1x dose of IV lasix 40mg  and 1x duoneb treatment.   On arrival to patients room, she does appear to have increased work of breathing. She is able to talk but only in short sentences.      10/24/2023    3:41 PM 10/24/2023   11:47 AM 10/24/2023    8:51 AM  Vitals with BMI  Systolic 109 129 098  Diastolic 67 97 70  Pulse 110 102 93   On 3L [place] O2 sats 93% RR32   Physical Exam  Constitutional: In no distress.  Cardiovascular: Normal rate, regular rhythm. No lower extremity edema  Pulmonary: Mildly increased WOB on [place], diffuse wheezing, poor air movement Abdominal: Soft. Normal bowel sounds. Non distended and non tender Skin: Skin is warm and dry.    -cxr -ABG -Higher dose of albuterol neb given wheezing on exam  -Possibly transfer to progressive for bipap if above fails

## 2023-10-24 NOTE — Progress Notes (Signed)
Pt is on continous BIPAP but no order place. NP Modou made aware. Will continue to monitor.  Update 0146: See new orders. Will continue to monitor.  Update 0440: Telemonitoring ordered but no available equipment available at this time per portable/ Charge Nurse made aware. Will continue to monitor.

## 2023-10-25 DIAGNOSIS — J9621 Acute and chronic respiratory failure with hypoxia: Secondary | ICD-10-CM | POA: Diagnosis not present

## 2023-10-25 LAB — BASIC METABOLIC PANEL
Anion gap: 16 — ABNORMAL HIGH (ref 5–15)
BUN: 27 mg/dL — ABNORMAL HIGH (ref 8–23)
CO2: 34 mmol/L — ABNORMAL HIGH (ref 22–32)
Calcium: 8.9 mg/dL (ref 8.9–10.3)
Chloride: 85 mmol/L — ABNORMAL LOW (ref 98–111)
Creatinine, Ser: 1.02 mg/dL — ABNORMAL HIGH (ref 0.44–1.00)
GFR, Estimated: 60 mL/min — ABNORMAL LOW (ref >60)
Glucose, Bld: 168 mg/dL — ABNORMAL HIGH (ref 70–99)
Potassium: 4 mmol/L (ref 3.5–5.1)
Sodium: 135 mmol/L (ref 135–145)

## 2023-10-25 LAB — GLUCOSE, CAPILLARY
Glucose-Capillary: 140 mg/dL — ABNORMAL HIGH (ref 70–99)
Glucose-Capillary: 152 mg/dL — ABNORMAL HIGH (ref 70–99)
Glucose-Capillary: 182 mg/dL — ABNORMAL HIGH (ref 70–99)
Glucose-Capillary: 199 mg/dL — ABNORMAL HIGH (ref 70–99)
Glucose-Capillary: 126 mg/dL — ABNORMAL HIGH (ref 70–99)
Glucose-Capillary: 271 mg/dL — ABNORMAL HIGH (ref 70–99)

## 2023-10-25 LAB — RESPIRATORY PANEL BY PCR

## 2023-10-25 LAB — MAGNESIUM: Magnesium: 2.1 mg/dL (ref 1.7–2.4)

## 2023-10-25 LAB — SARS CORONAVIRUS 2 BY RT PCR: SARS Coronavirus 2 by RT PCR: NEGATIVE

## 2023-10-25 MED ORDER — HYDROXYZINE HCL 25 MG PO TABS
25.0000 mg | ORAL_TABLET | Freq: Once | ORAL | Status: AC
  Administered 2023-10-25: 25 mg via ORAL
  Filled 2023-10-25: qty 1

## 2023-10-25 MED ORDER — PREDNISONE 20 MG PO TABS
40.0000 mg | ORAL_TABLET | Freq: Every day | ORAL | Status: DC
  Administered 2023-10-25 – 2023-10-28 (×4): 40 mg via ORAL
  Filled 2023-10-25 (×4): qty 2

## 2023-10-25 MED ORDER — IPRATROPIUM-ALBUTEROL 0.5-2.5 (3) MG/3ML IN SOLN
3.0000 mL | RESPIRATORY_TRACT | Status: AC
  Administered 2023-10-25 – 2023-10-26 (×3): 3 mL via RESPIRATORY_TRACT
  Filled 2023-10-25 (×3): qty 3

## 2023-10-25 NOTE — Plan of Care (Signed)
  Problem: Education: Goal: Knowledge of General Education information will improve Description: Including pain rating scale, medication(s)/side effects and non-pharmacologic comfort measures Outcome: Progressing   Problem: Clinical Measurements: Goal: Respiratory complications will improve Outcome: Progressing   Problem: Clinical Measurements: Goal: Cardiovascular complication will be avoided Outcome: Progressing   Problem: Pain Management: Goal: General experience of comfort will improve Outcome: Progressing   Problem: Safety: Goal: Ability to remain free from injury will improve Outcome: Progressing   Problem: Education: Goal: Knowledge of disease or condition will improve Outcome: Progressing

## 2023-10-25 NOTE — Plan of Care (Signed)
  Problem: Education: Goal: Knowledge of disease or condition will improve Outcome: Progressing Goal: Knowledge of secondary prevention will improve (MUST DOCUMENT ALL) Outcome: Progressing Goal: Knowledge of patient specific risk factors will improve (Mark N/A or DELETE if not current risk factor) Outcome: Progressing   Problem: Ischemic Stroke/TIA Tissue Perfusion: Goal: Complications of ischemic stroke/TIA will be minimized Outcome: Progressing   

## 2023-10-25 NOTE — Progress Notes (Signed)
   10/24/23 2104  Assess: MEWS Score  Temp 98.1 F (36.7 C)  BP (!) 140/80  MAP (mmHg) 97  ECG Heart Rate (!) 114  Resp 20  Level of Consciousness Alert  SpO2 96 %  O2 Device Bi-PAP  Assess: MEWS Score  MEWS Temp 0  MEWS Systolic 0  MEWS Pulse 2  MEWS RR 0  MEWS LOC 0  MEWS Score 2  MEWS Score Color Yellow  Assess: if the MEWS score is Yellow or Red  Were vital signs accurate and taken at a resting state? Yes  Does the patient meet 2 or more of the SIRS criteria? No  MEWS guidelines implemented  No, previously yellow, continue vital signs every 4 hours  Notify: Charge Nurse/RN  Name of Charge Nurse/RN Notified Ranae Pila, RN  Assess: SIRS CRITERIA  SIRS Temperature  0  SIRS Respirations  0  SIRS Pulse 1  SIRS WBC 0  SIRS Score Sum  1

## 2023-10-25 NOTE — TOC Progression Note (Signed)
Transition of Care [facility]) - Progression Note    Patient Details  Name: Michele House MRN: [id number] Date of Birth: 1956-05-19  Transition of Care [facility]) CM/SW Contact  Truddie Hidden, RN Phone Number: 10/25/2023, 10:31 AM  Clinical Narrative:    RNCM spoke with Gena in admissions at [facility]. Patient approved per facility to come today. MD notified.     Expected Discharge Plan: Skilled Nursing Facility Barriers to Discharge: Continued Medical Work up  Expected Discharge Plan and Services                                               Social Determinants of Health (SDOH) Interventions SDOH Screenings   Food Insecurity: Patient Unable To Answer (10/03/2023)  Recent Concern: Food Insecurity - Food Insecurity Present (09/24/2023)  Housing: High Risk (10/03/2023)  Transportation Needs: Patient Unable To Answer (10/03/2023)  Recent Concern: Transportation Needs - Unmet Transportation Needs (09/24/2023)  Utilities: Patient Unable To Answer (10/03/2023)  Tobacco Use: Medium Risk (10/20/2023)    Readmission Risk Interventions     No data to display

## 2023-10-25 NOTE — Care Management Important Message (Signed)
Important Message  Patient Details  Name: Michele House MRN: [id number] Date of Birth: 1956/02/06   Important Message Given:  Yes - Medicare IM     Bernadette Hoit 10/25/2023, 11:55 AM

## 2023-10-25 NOTE — Progress Notes (Signed)
Progress Note    Michele House  KGM:[id number] DOB: 1956/01/08  DOA: 10/02/2023 PCP: [facility]      Brief Narrative:    Medical records reviewed and are as summarized below:  Michele House is a 67 y.o. female with medical history significant of COPD, chronic respiratory failure on 2 L, hypertension, type 2 diabetes, HFpEF, recent discharge from the hospital 24th November 2024 after hospitalization for COPD exacerbation complicated by respiratory failure.  She presented to the hospital again on 10/02/2023 with chest pain, malaise, cough, shortness of breath, abdominal pain and dysuria.  She was admitted to the hospital for acute on chronic hypoxemic respiratory failure due to COPD exacerbation, UTI, and pneumonia.  She became more confused in the hospital and MRI brain revealed acute stroke.  12/1: Vital stable, respiratory viral panel negative, procalcitonin negative, preliminary blood cultures negative.  Urine cultures are ordered as add-on, ammonia levels mildly elevated at 39, strep pneumo negative, CBG elevated at 244, history of enlarging pulmonary nodule with recommendations for PET/CT during most recent admission which has not been done yet, if video bronchoscopy was scheduled for 10/13/2023 as outpatient.  Unable to take care of himself and son cannot provide the required care as she required 24-hour supervision due to worsening dementia.  PT is recommending SNF.  12/2: Hemodynamically stable, now on baseline oxygen of 2 L.  Needs SNF placement. Urine cultures pending.  12/3: Blood pressure started trending up, restarting home Zestoretic.  Urine cultures with strep agalactiae, penicillin allergy noted, will continue with ceftriaxone for now.  Also started on Remeron for concern of worsening depression and unable to sleep at night  12/4.  Some very concerned about patient's mental status not being seen.  Patient receiving antibiotics already and  should have improved by now.  Will hold Lipitor.  Restart low-dose Zoloft.  Discontinue Remeron and do Seroquel at night. 12/5.  Patient has some blurry vision and weakness and pain.  MRI of the brain negative for acute intracranial abnormality. 12/6.  Patient stated she slept well.  Patient able to lift up her arms up off the bed today.  Patient thought she heard her son's voice outside the room. 12/7.  Patient sitting up in chair when I saw her.  Was able to feed herself breakfast.  Felt okay.  Did not offer any complaints. 12/8.  Patient more confused today than yesterday.  Able to follow some simple commands but not as good of a conversation today as yesterday. 12/9.  Patient still confused today.  Does not follow simple commands today. 12/10.  Patient more talkative today but was looking up at the ceiling when she was talking with me.  She does not move her extremities to command but does move them on her own.  Physical therapy and Occupational Therapy did not see her move her right arm. 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  Mild associated petechial hemorrhage.  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 12/12: Patient with new stroke, CTA head and neck with no large vessel occlusion.  Patient had a recent echocardiogram which was normal. CSF cultures remain negative.  Lipid panel with increased triglyceride at 347, HDL 29 and LDL of 38.  CBC with mild thrombocytopenia at 130, BMP mostly unremarkable.  Patient with significant  right-sided weakness. Concern of paraneoplastic versus hypercoagulability secondary to malignancy.  Patient missed her appointment for bronchoscopy and outpatient appointment for PET scan due to recurrent hospitalizations. Involving pulmonary and oncology for their input. Send out  encephalitis panel still pending.  12/13: Patient continued to have waxing and waning mental status, unable to move right upper and lower extremities.  Having some hallucination.  Pulmonary is planning lung biopsy on 12/27 if she remains stable.  Rapidly progressive dementia and now with underlying stroke, question of paraneoplastic encephalitis and hypercoagulability secondary to underlying undiagnosed malignancy. Patient will also get benefit from neuropsych evaluation as outpatient.  12/14: Patient with some improved mentation today.  Trying to squeeze with right hand but still no movements of right lower extremity.  12/15: Patient with much improved mentation.  Having some flickering of movements on right upper and lower extremity today, left extremities weaker but seems improving.   12/16: Mental status seems stable, flaccid right upper and lower extremities.  Complaining of muscle spasms so Robaxin was ordered.  Pulmonary is trying to do bronchoscopy and biopsy on Wednesday.      Assessment/Plan:   Principal Problem:   Acute metabolic encephalopathy Active Problems:   CVA (cerebral vascular accident) (HCC)   Uncontrolled type 2 diabetes mellitus with hypoglycemia, without long-term current use of insulin (HCC)   Hypotension   Acute on chronic respiratory failure with hypoxia (HCC)   Hypernatremia   COPD exacerbation (HCC)   Lung nodule   Pneumonia   UTI (urinary tract infection)   (HFpEF) heart failure with preserved ejection fraction (HCC)   Essential hypertension   Type 2 diabetes mellitus with obesity (HCC)    Body mass index is 30.92 kg/m.    Acute on chronic hypercapnic RF ABG with PCO2 > 45, but normal pH. Possibly metabolic alkalosis component as well. She was discharged home on O2 06/2023 but reportedly does not wear this as she states her O2 sats are ?94% per chart review. She is currently requiring 3L of O2 and bipap due to her work of breathing. She does not  appear to be moving air well on exam and has increased work of breathing.  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  R sided deficits are stable.   -Continue Plavix. If she is found to have underlying malignancy and hypercoagulable state then she may need anticoagulation instead of Plavix per neurologist.   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    Type II DM with hyperglycemia:  CBG (last 3)  Recent Labs    10/25/23 0512 10/25/23 0730 10/25/23 1221  GLUCAP 152* 182* 199*   Better controlled with Semglee 18 units.  Continue sliding scale insulin  Hypochloremia  Mild hyponatremia  Metabolic alkalosis     Latest Ref Rng & Units 10/25/2023    4:15 AM 10/24/2023    6:14 AM 10/23/2023    7:33 AM  BMP  Glucose 70 - 99 mg/dL 630  160  109   BUN 8 - 23 mg/dL 27  23  19    Creatinine 0.44 -  1.00 mg/dL 5.64  3.32  9.51   Sodium 135 - 145 mmol/L 135  132  134   Potassium 3.5 - 5.1 mmol/L 4.0  4.1  4.1   Chloride 98 - 111 mmol/L 85  87  88   CO2 22 - 32 mmol/L 34  34  36   Calcium 8.9 - 10.3 mg/dL 8.9  8.9  8.9   Stable, Given IV lasix yesterday due to respiratory status   AKI  Likely due to over diuresis   Hypotension:Resolved   Pneumonia: Completed 5 days of IV ceftriaxone and azithromycin  Acute UTI: Resolved Urine culture showed strep agalactiae.  Completed course of IV ceftriaxone.  HFpEF Received additional IV lasix yesterday due to respiratory status. Hold further diuresis for now. Euvolemic on exam.  Metabolic alkalosis     1.3 cm right upper lung spiculated nodule: S/p bronchoscopy on 10/20/2023.  Path showing rare atypical cells more consistent with reactive process.  Lymph node negative for cancer as well.    Diet Order             DIET DYS 3 Room service appropriate? Yes; Fluid consistency: Thin  Diet effective now                   Consultants: Pulmonologist Hematologist  Procedures: Lumbar puncture on 10/08/2023 Bronchoscopy on 10/20/2023    Medications:    clopidogrel  75 mg Oral Daily   enoxaparin (LOVENOX) injection  40 mg Subcutaneous Daily   insulin aspart  0-15 Units Subcutaneous TID WC   insulin aspart  0-5 Units Subcutaneous QHS   insulin glargine-yfgn  18 Units Subcutaneous QHS   ipratropium-albuterol  3 mL Nebulization Q4H while awake   levothyroxine  100 mcg Oral Q0600   ramelteon  8 mg Oral QHS   rOPINIRole  4 mg Oral QHS   senna-docusate  1 tablet Oral Daily   sertraline  25 mg Oral Daily   Continuous Infusions:   Anti-infectives (From admission, onward)    Start     Dose/Rate Route Frequency Ordered Stop   10/04/23 1000  azithromycin (ZITHROMAX) tablet 500 mg        500 mg Oral Daily 10/03/23 0751 10/06/23 0856   10/03/23 0600  cefTRIAXone (ROCEPHIN) 2 g in sodium chloride 0.9 % 100 mL IVPB  Status:  Discontinued        2 g 200 mL/hr over 30 Minutes Intravenous Every 24 hours 10/02/23 0740 10/07/23 0854   10/02/23 0745  cefTRIAXone (ROCEPHIN) 2 g in sodium chloride 0.9 % 100 mL IVPB  Status:  Discontinued        2 g 200 mL/hr over 30 Minutes Intravenous Every 24 hours 10/02/23 0738 10/02/23 0740   10/02/23 0745  azithromycin (ZITHROMAX) 500 mg in sodium chloride 0.9 % 250 mL IVPB        500 mg 250 mL/hr over 60 Minutes Intravenous Every 24 hours 10/02/23 0738 10/03/23 1236   10/02/23 0630  cefTRIAXone (ROCEPHIN) 2 g in sodium chloride 0.9 % 100 mL IVPB        2 g 200 mL/hr over 30 Minutes Intravenous  Once 10/02/23 0617 10/02/23  0714              Family Communication/Anticipated D/C date and plan/Code Status   DVT prophylaxis: enoxaparin (LOVENOX) injection 40 mg Start: 10/12/23 1115 SCDs Start: 10/02/23 8841     Code Status: Full Code  Family Communication: None Disposition Plan: Plan to discharge to  SNF    Status is: Inpatient Remains inpatient appropriate because: Worsened respiratory status    Subjective:   Had a RR ON. Patient with increased work of breathing. Only able to speak a few short sentences. Difficulty moving air. She was transferred to progressive and started on bipap.   This AM she is unclear if bipap has helped but her breathing is improved.   Objective:    Vitals:   10/25/23 0728 10/25/23 0800 10/25/23 1037 10/25/23 1218  BP:  113/67  (!) 155/67  Pulse:  93  99  Resp:  12  16  Temp: 97.8 F (36.6 C)   97.9 F (36.6 C)  TempSrc: Oral   Oral  SpO2:  99% 100% 98%  Weight:      Height:       No data found.   Intake/Output Summary (Last 24 hours) at 10/25/2023 1555 Last data filed at 10/25/2023 0547 Gross per 24 hour  Intake 150 ml  Output 600 ml  Net -450 ml   Filed Weights   10/05/23 0912 10/20/23 0732  Weight: 74.1 kg 74.1 kg   Physical Exam  Constitutional: In no distress.  Cardiovascular: Normal rate, regular rhythm. No lower extremity edema  Pulmonary: Non labored breathing on [place], no rales, mild wheezing No lower extremity edema Abdominal: Soft. Normal bowel sounds. Non distended and non tender Musculoskeletal: Normal range of motion.     Neurological: Alert and oriented to person, place, and time. Non focal  Skin: Skin is warm and dry.    Data Reviewed:   I have personally reviewed following labs and imaging studies:  Labs: Labs show the following:   Basic Metabolic Panel: Recent Labs  Lab 10/19/23 1252 10/23/23 0733 10/24/23 0614 10/25/23 0415  NA 135 134* 132* 135  K 4.5 4.1 4.1 4.0  CL 93* 88* 87* 85*  CO2 33* 36* 34* 34*   GLUCOSE 201* 198* 235* 168*  BUN 13 19 23  27*  CREATININE 0.81 0.77 0.77 1.02*  CALCIUM 8.9 8.9 8.9 8.9  MG  --   --  2.0 2.1  PHOS  --   --  3.6  --    GFR Estimated Creatinine Clearance: 47.9 mL/min (A) (by C-G formula based on SCr of 1.02 mg/dL (H)). Liver Function Tests: Recent Labs  Lab 10/19/23 1252 10/24/23 0614  AST 18 22  ALT 18 20  ALKPHOS 70 72  BILITOT <0.2 0.7  PROT 5.4* 6.0*  ALBUMIN 3.1* 2.8*   No results for input(s): "LIPASE", "AMYLASE" in the last 168 hours. No results for input(s): "AMMONIA" in the last 168 hours.  Coagulation profile No results for input(s): "INR", "PROTIME" in the last 168 hours.  CBC: Recent Labs  Lab 10/19/23 1252 10/23/23 0733 10/24/23 0614  WBC 6.7 6.1 6.9  NEUTROABS 5.3  --  6.1  HGB 9.6* 9.7* 9.6*  HCT 29.5* 29.9* 29.3*  MCV 90.8 89.5 88.8  PLT 188 209 203   Cardiac Enzymes: No results for input(s): "CKTOTAL", "CKMB", "CKMBINDEX", "TROPONINI" in the last 168 hours. BNP (last 3 results) No results for input(s): "PROBNP" in the last 8760 hours. CBG: Recent Labs  Lab 10/24/23 2059 10/24/23 2224 10/25/23 0512 10/25/23 0730 10/25/23 1221  GLUCAP 140* 162* 152* 182* 199*   D-Dimer: No results for input(s): "DDIMER" in the last 72 hours. Hgb A1c: No results for input(s): "HGBA1C" in the last 72 hours. Lipid Profile: No results for input(s): "CHOL", "HDL", "LDLCALC", "TRIG", "CHOLHDL", "LDLDIRECT" in the last  72 hours. Thyroid function studies: No results for input(s): "TSH", "T4TOTAL", "T3FREE", "THYROIDAB" in the last 72 hours.  Invalid input(s): "FREET3" Anemia work up: No results for input(s): "VITAMINB12", "FOLATE", "FERRITIN", "TIBC", "IRON", "RETICCTPCT" in the last 72 hours. Sepsis Labs: Recent Labs  Lab 10/19/23 1252 10/23/23 0733 10/24/23 0614  WBC 6.7 6.1 6.9    Microbiology Recent Results (from the past 240 hours)  Culture, BAL-quantitative w Gram Stain     Status: Abnormal   Collection  Time: 10/20/23  9:49 AM   Specimen: Bronchoalveolar Lavage; Respiratory  Result Value Ref Range Status   Specimen Description   Final    BRONCHIAL ALVEOLAR LAVAGE Performed at [facility], [address] [place], [place] [address]    Special Requests   Final    NONE Performed at [facility], [address] [place], [place] [address]    Gram Stain   Final    RARE WBC PRESENT,BOTH PMN AND MONONUCLEAR RARE GRAM POSITIVE RODS    Culture (A)  Final    20,000 COLONIES/mL Consistent with normal respiratory flora. Performed at [facility], [address], [place], [place] [address]    Report Status 10/22/2023 FINAL  Final    Procedures and diagnostic studies:  DG Chest Port 1 View Result Date: 10/24/2023 CLINICAL DATA:  Shortness of breath with recent rapid response. EXAM: PORTABLE CHEST 1 VIEW COMPARISON:  10/20/2023 FINDINGS: Lungs are adequately inflated with mild prominence of the infrahilar bronchovascular markings which may be due to minimal vascular congestion versus viral bronchopneumonia. No airspace consolidation, effusion or pneumothorax. Cardiomediastinal silhouette and remainder of the exam is unchanged. IMPRESSION: Mild prominence of the infrahilar bronchovascular markings which may be due to minimal vascular congestion versus viral bronchopneumonia. Electronically Signed   By: Elberta Fortis M.D.   On: 10/24/2023 19:48     LOS: 23 days   Matthews [employer]   Pager on [email or web address]. If 7PM-7AM, please contact night-coverage at [email or web address]   10/25/2023, 3:55 PM

## 2023-10-25 NOTE — Progress Notes (Signed)
PULMONOLOGY         Date: 10/25/2023,   MRN# [id number] Michele House [birth year]     AdmissionWeight: 74.1 kg                 CurrentWeight: 74.1 kg  Referring provider: Dr Nelson Chimes   CHIEF COMPLAINT:   Lung nodule suspicious for carcinoma   HISTORY OF PRESENT ILLNESS   Patient with COPD, dyslipidemia, HTN, thyroid dysfunction, GERD, She has 1.5cm RUL nodule suspicious for primary bronchogenic carcinoma. She needs to have tissue diagnosis. PCCM consultation for biopsy.   10/15/23- patient encepalopathic on examination.  Have ordered ABG, Uric acid, ammonia levels, renal function is normal, electrolytes normal , sugars improved <200.  Neuro and oncology following, s/p LP, s/p paraneoplastic panel, s/p autoimmune panel.  Planning for lung biopsy on 10/29/23 if patient is more stable and safe candidate for general anesthesia  10/17/23- patient awake and alert, she is able to answer questions appropriately.  Encephalopathy has improved markedly. We discussed lung biopsy she has consented to procedure and wishes to proceed as planned.  10/18/23- patient reports leg pain.  She wants to get OOB to walk with PT.  She seems a bit sleepy during interview.  I have dcd claritin.  10/19/23- patient is improved sitting up in bed eating.  She is alert and lucid.  Mild edema on examination. Have ordered CBC and CMP today.  Will diurese today due to 1+ edema on examination.  NPO today at midnight.  Lung biopsy in AM at 730AM 10/20/23- patient evaluated this am , she reports feeling well. She is on 3L/min [place].  She had presurgical evaluation by anesthesia, medical service and neurology yesterday as well as myself.  Group discussion via secure chat with agreement that benefit of this lung biopsy with goal to cure cancer outweighs the risks.  Decision made to proceed as planned by all providers and patient.   10/21/23- patient stable , she is being optimized for dc for rehab.  She's cleared from  pulmonary for discharge. Results from bronch will take 1 week.  10/22/23- patient seen at bedside , she's on 2-3L/min [place].  She reports anxiety due to not having her pillows fluffed and she did not receive warmed blanket that she requested.  She reports being jittery tonight but denies pain anywhere.  She has been in bed but states she had PT today.  10/23/23- patient seen at bedside, is now weaned off oxygen completely.  She seems clinically close to baseline.   10/24/23- patient seen at bedside , on room air.  Main complaint today is constipation.  She had tap water enema at 5pm yesterday. She requests help with BM, I ve discussed with RN Vernona Rieger and we will deliver suppository and miralax.  10/25/23- patient seen at bedside.  Patient is on room air. Reports improvement in abd discomfort.     PAST MEDICAL HISTORY   Past Medical History:  Diagnosis Date   Acid reflux    COPD (chronic obstructive pulmonary disease) (HCC)    Diabetes mellitus without complication (HCC)    Hyperlipidemia    Hypertension    Thyroid disease      SURGICAL HISTORY   Past Surgical History:  Procedure Laterality Date   appendectomy     APPENDECTOMY     BREAST BIOPSY Right    CORE W/CLIP - NEG   TOTAL VAGINAL HYSTERECTOMY     TUMOR REMOVAL     benign;stomach  VIDEO BRONCHOSCOPY WITH ENDOBRONCHIAL NAVIGATION Right 10/20/2023   Procedure: VIDEO BRONCHOSCOPY WITH ENDOBRONCHIAL NAVIGATION;  Surgeon: Vida Rigger, MD;  Location: [facility] ORS;  Service: Thoracic;  Laterality: Right;     FAMILY HISTORY   Family History  Problem Relation Age of Onset   Heart attack Mother    Hypertension Mother    Breast cancer Sister 55   Breast cancer Maternal Aunt        40'S   Breast cancer Maternal Grandmother      SOCIAL HISTORY   Social History   Tobacco Use   Smoking status: Former    Current packs/day: 0.00    Types: Cigarettes    Quit date: 12/15/1985    Years since quitting: 37.8   Smokeless tobacco:  Never  Substance Use Topics   Alcohol use: No   Drug use: No     MEDICATIONS    Home Medication:    Current Medication:  Current Facility-Administered Medications:    acetaminophen (TYLENOL) tablet 650 mg, 650 mg, Oral, Q6H PRN, Floydene Flock, MD, 650 mg at 10/25/23 0038   bisacodyl (DULCOLAX) suppository 10 mg, 10 mg, Rectal, Daily PRN, Marolyn Haller, MD, 10 mg at 10/24/23 1548   clopidogrel (PLAVIX) tablet 75 mg, 75 mg, Oral, Daily, Milon Dikes, MD, 75 mg at 10/24/23 0837   dextromethorphan-guaiFENesin (MUCINEX DM) 30-600 MG per 12 hr tablet 1 tablet, 1 tablet, Oral, BID PRN, Floydene Flock, MD, 1 tablet at 10/24/23 1622   enoxaparin (LOVENOX) injection 40 mg, 40 mg, Subcutaneous, Daily, Wieting, Richard, MD, 40 mg at 10/24/23 0838   insulin aspart (novoLOG) injection 0-15 Units, 0-15 Units, Subcutaneous, TID WC, Wieting, Richard, MD, 8 Units at 10/24/23 1648   insulin aspart (novoLOG) injection 0-5 Units, 0-5 Units, Subcutaneous, QHS, Alford Highland, MD, 4 Units at 10/20/23 2152   insulin glargine-yfgn (SEMGLEE) injection 18 Units, 18 Units, Subcutaneous, QHS, Marolyn Haller, MD, 18 Units at 10/24/23 2322   ipratropium-albuterol (DUONEB) 0.5-2.5 (3) MG/3ML nebulizer solution 3 mL, 3 mL, Nebulization, Q4H PRN, Floydene Flock, MD, 3 mL at 10/24/23 1605   levothyroxine (SYNTHROID) tablet 100 mcg, 100 mcg, Oral, Q0600, Floydene Flock, MD, 100 mcg at 10/25/23 0503   menthol-cetylpyridinium (CEPACOL) lozenge 3 mg, 1 lozenge, Oral, PRN, Vida Rigger, MD, 3 mg at 10/22/23 0604   methocarbamol (ROBAXIN) tablet 500 mg, 500 mg, Oral, Q8H PRN, Arnetha Courser, MD, 500 mg at 10/25/23 0509   ondansetron (ZOFRAN) injection 4 mg, 4 mg, Intravenous, Q6H PRN, Lurene Shadow, MD, 4 mg at 10/24/23 2119   Oral care mouth rinse, 15 mL, Mouth Rinse, PRN, Wieting, Richard, MD   QUEtiapine (SEROQUEL) tablet 12.5 mg, 12.5 mg, Oral, QHS PRN, Renae Gloss, Richard, MD, 12.5 mg at 10/24/23 2314    ramelteon (ROZEREM) tablet 8 mg, 8 mg, Oral, QHS, Marolyn Haller, MD, 8 mg at 10/24/23 2313   rOPINIRole (REQUIP XL) 24 hr tablet 4 mg, 4 mg, Oral, QHS, Amin, Tilman Neat, MD, 4 mg at 10/24/23 2313   senna-docusate (Senokot-S) tablet 1 tablet, 1 tablet, Oral, Daily, Marolyn Haller, MD, 1 tablet at 10/24/23 1478   sertraline (ZOLOFT) tablet 25 mg, 25 mg, Oral, Daily, Wieting, Richard, MD, 25 mg at 10/24/23 0837   sodium chloride (OCEAN) 0.65 % nasal spray 1 spray, 1 spray, Each Nare, PRN, Marolyn Haller, MD   traMADol Janean Sark) tablet 50 mg, 50 mg, Oral, Q6H PRN, Arnetha Courser, MD, 50 mg at 10/24/23 2313    ALLERGIES   Penicillins, Aspirin, and  Sulfa antibiotics     REVIEW OF SYSTEMS    Review of Systems:  Gen:  Denies  fever, sweats, chills weigh loss  HEENT: Denies blurred vision, double vision, ear pain, eye pain, hearing loss, nose bleeds, sore throat Cardiac:  No dizziness, chest pain or heaviness, chest tightness,edema Resp:   reports dyspnea chronically  Gi: Denies swallowing difficulty, stomach pain, nausea or vomiting, diarrhea, constipation, bowel incontinence Gu:  Denies bladder incontinence, burning urine Ext:   Denies Joint pain, stiffness or swelling Skin: Denies  skin rash, easy bruising or bleeding or hives Endoc:  Denies polyuria, polydipsia , polyphagia or weight change Psych:   Denies depression, insomnia or hallucinations   Other:  All other systems negative   VS: BP 138/71 (BP Location: Right Arm)   Pulse (!) 107   Temp 97.8 F (36.6 C) (Oral)   Resp 20   Ht 5' 0.95" (1.548 m)   Wt 74.1 kg   LMP 12/24/1998 Comment: prior to her hysterectomy  SpO2 100%   BMI 30.92 kg/m      PHYSICAL EXAM    GENERAL:NAD, no fevers, chills, no weakness no fatigue HEAD: Normocephalic, atraumatic.  EYES: Pupils equal, round, reactive to light. Extraocular muscles intact. No scleral icterus.  MOUTH: Moist mucosal membrane. Dentition intact. No abscess noted.   EAR, NOSE, THROAT: Clear without exudates. No external lesions.  NECK: Supple. No thyromegaly. No nodules. No JVD.  PULMONARY: decreased breath sounds with mild rhonchi worse at bases bilaterally.  CARDIOVASCULAR: S1 and S2. Regular rate and rhythm. No murmurs, rubs, or gallops. No edema. Pedal pulses 2+ bilaterally.  GASTROINTESTINAL: Soft, nontender, nondistended. No masses. Positive bowel sounds. No hepatosplenomegaly.  MUSCULOSKELETAL: No swelling, clubbing, or edema. Range of motion full in all extremities.  NEUROLOGIC: Cranial nerves II through XII are intact. No gross focal neurological deficits. Appears tired with excessive sleepiness  SKIN: No ulceration, lesions, rashes, or cyanosis. Skin warm and dry. Turgor intact.  PSYCHIATRIC: Mood, affect within normal limits. The patient is awake, alert and oriented x 3. Insight, judgment intact.       IMAGING   CT chest -  Enlarging spiculated nodule in the inferior right upper lobe now 1.6 cm compared to 1.3 cm previously.   ASSESSMENT/PLAN   Lung nodule suspicious for primary bronchogenic carcinoma   1.5cm - RUL   -patient s/p bronchoscopy - results- showing rare atypical cells from surgical pathology more consistent with reactive process. Lymph node negative for cancer  -s/p bronchoscopy patient is stable     Encephalopathy- RESOLVED  S/p neuro and med/onc eval  - ordered ammonia, uric acid, ABG today -reviewed    COPD with bronchitic phenotype   - patient reports tightness in chest today   - she reports panic attack when resp status deteriorates.  She is on 3L/min O2 at this time.    -She uses albuterol and Breo at home   Thank you for allowing me to participate in the care of this patient.   Patient/Family are satisfied with care plan and all questions have been answered.    Provider disclosure: Patient with at least one acute or chronic illness or injury that poses a threat to life or bodily function and is being  managed actively during this encounter.  All of the below services have been performed independently by signing provider:  review of prior documentation from internal and or external health records.  Review of previous and current lab results.  Interview and comprehensive assessment  during patient visit today. Review of current and previous chest radiographs/CT scans. Discussion of management and test interpretation with health care team and patient/family.   This document was prepared using Dragon voice recognition software and may include unintentional dictation errors.     Vida Rigger, M.D.  Division of Pulmonary & Critical Care Medicine

## 2023-10-25 NOTE — Evaluation (Signed)
Physical Therapy Re-Evaluation Patient Details Name: GENEICE SCHANK MRN: [id number] DOB: 07-29-1956 Today's Date: 10/25/2023  History of Present Illness  Pt is a 67 y/o F admitted on 10/02/23 after presenting with acute on chronic respiratory failure with hypoxia, COPD exacerbation, PNA, UTI, & encephalopathy.  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 Mild associated  petechial hemorrhage".  Pt transferred to progressive care unit 10/24/23 d/t respiratory concerns.  New PT/OT orders received.  PMH: COPD, chronic respiratory failure on 2L,HTN, DM2, HFpEF.  Clinical Impression  Pt seen for PT re-evaluation; partial PT/OT co-session performed.  Pt reporting needing to have BM upon therapy arrival so pt assisted on bedpan.  Pt able to have small BM but unable to get anymore out; pt rolled to side and pt able to finish having large bowel movement in side-lying (nurse notified/updated on pt's status).  Pt total assist with peri-care.  Limited activity d/t pt fatigue and SOB with activities (SpO2 sats 94% or greater on 4 L O2 via nasal cannula during sessions activities).  Pt would currently benefit from skilled PT to address noted impairments and functional limitations (see below for any additional details).  Upon hospital discharge, pt would benefit from ongoing therapy.        If plan is discharge home, recommend the following: Assistance with cooking/housework;Supervision due to cognitive status;Direct supervision/assist for financial management;Help with stairs or ramp for entrance;Assist for transportation;Direct supervision/assist for medications management;Assistance with feeding;Two people to help with walking and/or transfers;A lot of help with bathing/dressing/bathroom   Can travel by private vehicle   No    Equipment Recommendations BSC/3in1;Wheelchair (measurements PT);Wheelchair  cushion (measurements PT);Hoyer lift;Hospital bed  Recommendations for Other Services       Functional Status Assessment Patient has had a recent decline in their functional status and demonstrates the ability to make significant improvements in function in a reasonable and predictable amount of time.     Precautions / Restrictions Precautions Precautions: Fall Restrictions Weight Bearing Restrictions Per Provider Order: No      Mobility  Bed Mobility Overal bed mobility: Needs Assistance Bed Mobility: Rolling Rolling: Max assist         General bed mobility comments: MAX A for rolling side to side during bed level toileting; vc's and assist for positioning    Transfers                        Ambulation/Gait                  Stairs            Wheelchair Mobility     Tilt Bed    Modified Rankin (Stroke Patients Only)       Balance                                             Pertinent Vitals/Pain Pain Assessment Pain Assessment: Faces Faces Pain Scale: Hurts little more Pain Location: rectum with BM attempt Pain Descriptors / Indicators: Grimacing Pain Intervention(s): Monitored during session, Repositioned    Home Living Family/patient expects to be discharged to:: Private residence Living Arrangements: Children (pt's son) Available Help at Discharge: Family;Available PRN/intermittently (son works (leaves the house 3-4 AM)) Type of Home: Mobile home Home Access: Stairs to enter  Entrance Stairs-Rails: Right;Left;Can reach both Entrance Stairs-Number of Steps: 4-5   Home Layout: One level Home Equipment: Grab bars - tub/shower      Prior Function Prior Level of Function : Needs assist             Mobility Comments: Pt reports furniture walking prior to admission; unsure about falls ADLs Comments: Sits in the tub; reports using 4L "tries to", doesn't get dressed just wears a pajama gown during the day,  indep with toileting, sister drives to appts, sister brings groceries, very light meal prep, reports being "all confused' with her medications     Extremity/Trunk Assessment   Upper Extremity Assessment Upper Extremity Assessment: Defer to OT evaluation RUE Deficits / Details: Per OT "trace contractions noted, hand with IV bandaged limiting AROM attempts"    Lower Extremity Assessment Lower Extremity Assessment: RLE deficits/detail;LLE deficits/detail RLE Deficits / Details: weak leg extensors; no AROM hip flexion and/or DF/wiggling toes noted LLE Deficits / Details: at least 3/5 AROM hip flexion, knee flexion/extension, and DF; impaired L LE coordination noted    Cervical / Trunk Assessment Cervical / Trunk Assessment: Normal  Communication   Communication Communication: Difficulty following commands/understanding Following commands: Follows one step commands inconsistently;Follows one step commands with increased time Cueing Techniques: Verbal cues;Visual cues  Cognition Arousal: Alert Behavior During Therapy: Flat affect Overall Cognitive Status: Impaired/Different from baseline                                 General Comments: Impaired problem solving; increased processing time; vc's required        General Comments  Pt agreeable to therapy session.    Exercises     Assessment/Plan    PT Assessment Patient needs continued PT services  PT Problem List Decreased strength;Decreased activity tolerance;Decreased balance;Decreased mobility;Cardiopulmonary status limiting activity;Decreased knowledge of use of DME;Decreased safety awareness;Decreased cognition;Decreased coordination;Decreased knowledge of precautions       PT Treatment Interventions DME instruction;Therapeutic exercise;Gait training;Balance training;Neuromuscular re-education;Functional mobility training;Therapeutic activities;Patient/family education;Cognitive remediation    PT Goals  (Current goals can be found in the Care Plan section)  Acute Rehab PT Goals Patient Stated Goal: to improve functional mobility PT Goal Formulation: With patient Time For Goal Achievement: 11/08/23 Potential to Achieve Goals: Fair    Frequency Min 1X/week     Co-evaluation PT/OT/SLP Co-Evaluation/Treatment: Yes Reason for Co-Treatment: Complexity of the patient's impairments (multi-system involvement);Necessary to address cognition/behavior during functional activity;For patient/therapist safety;To address functional/ADL transfers PT goals addressed during session: Mobility/safety with mobility OT goals addressed during session: ADL's and self-care       AM-PAC PT "6 Clicks" Mobility  Outcome Measure Help needed turning from your back to your side while in a flat bed without using bedrails?: A Lot Help needed moving from lying on your back to sitting on the side of a flat bed without using bedrails?: Total Help needed moving to and from a bed to a chair (including a wheelchair)?: Total Help needed standing up from a chair using your arms (e.g., wheelchair or bedside chair)?: Total Help needed to walk in hospital room?: Total Help needed climbing 3-5 steps with a railing? : Total 6 Click Score: 7    End of Session Equipment Utilized During Treatment: Oxygen (4 L O2 via nasal cannula) Activity Tolerance: Patient tolerated treatment well Patient left: in bed;with call bell/phone within reach;with bed alarm set;with nursing/sitter in room;Other (comment) (B LE's  elevated on pillows with heels floating; R UE elevated on pillow) Nurse Communication: Mobility status;Precautions;Other (comment) (pt had BM) PT Visit Diagnosis: Unsteadiness on feet (R26.81);Other abnormalities of gait and mobility (R26.89);Muscle weakness (generalized) (M62.81);Difficulty in walking, not elsewhere classified (R26.2);Hemiplegia and hemiparesis Hemiplegia - Right/Left: Right Hemiplegia -  dominant/non-dominant: Dominant Hemiplegia - caused by: Cerebral infarction    Time: 7846-9629 PT Time Calculation (min) (ACUTE ONLY): 21 min   Charges:   PT Evaluation $PT Re-evaluation: 1 Re-eval   PT General Charges $$ ACUTE PT VISIT: 1 Visit        Hendricks Limes, PT 10/25/23, 5:57 PM

## 2023-10-25 NOTE — Evaluation (Signed)
Occupational Therapy Re-Evaluation Patient Details Name: Michele House MRN: [id number] DOB: 09-Sep-1956 Today's Date: 10/25/2023   History of Present Illness Pt is a 67 y/o F admitted on 10/02/23 after presenting with acute on chronic respiratory failure with hypoxia, COPD exacerbation, PNA, UTI, & encephalopathy.  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 Mild associated  petechial hemorrhage".  PMH: COPD, chronic respiratory failure on 2L,HTN, DM2, HFpEF.   Clinical Impression   Pt was seen for OT re-evaluation this date. Pt presents to acute OT demonstrating impaired ADL performance and functional mobility 2/2 R side deficits in strength and ROM, impaired balance, and activity tolerance, with some deficits in problem solving noted (See OT problem list for additional functional deficits). Pt noted need to have a BM but having difficulty. Pt initially trialed use of bed pan and was partially successful with large BM noted to remain in rectum. Pt required MAX A to roll onto her side and with slight pressure to perineal area, and in sidelying, pt able to complete large formed BM and endorsed feeling much better. RN notified to support future BM efforts. Pt continues to required significant assist for ADL and mobility. Pt would benefit from continued skilled OT services to address noted impairments and functional limitations (see below for any additional details) in order to maximize safety and independence while minimizing falls risk and caregiver burden.     If plan is discharge home, recommend the following: Assistance with cooking/housework;Assist for transportation;Help with stairs or ramp for entrance;Direct supervision/assist for medications management;Supervision due to cognitive status;Direct supervision/assist for financial management;Two people to help with walking and/or transfers;Two  people to help with bathing/dressing/bathroom    Functional Status Assessment  Patient has had a recent decline in their functional status and demonstrates the ability to make significant improvements in function in a reasonable and predictable amount of time.  Equipment Recommendations  Other (comment) (defer)    Recommendations for Other Services       Precautions / Restrictions Precautions Precautions: Fall Restrictions Weight Bearing Restrictions Per Provider Order: No      Mobility Bed Mobility Overal bed mobility: Needs Assistance Bed Mobility: Rolling Rolling: Max assist         General bed mobility comments: MAX A for rolling side to side during bed level toileting    Transfers                          Balance                                           ADL either performed or assessed with clinical judgement   ADL Overall ADL's : Needs assistance/impaired                           Toilet Transfer Details (indicate cue type and reason): MAX A for rolling for bed level toileting Toileting- Clothing Manipulation and Hygiene: +2 for physical assistance;Maximal assistance;Bed level Toileting - Clothing Manipulation Details (indicate cue type and reason): MAX A for the rolling and maintaining sidelying and  MAX A pericare             Vision         Perception  Praxis         Pertinent Vitals/Pain Pain Assessment Pain Assessment: Faces Faces Pain Scale: Hurts little more Pain Location: rectum with BM attempt Pain Descriptors / Indicators: Grimacing Pain Intervention(s): Monitored during session, Repositioned     Extremity/Trunk Assessment Upper Extremity Assessment Upper Extremity Assessment: Right hand dominant;RUE deficits/detail RUE Deficits / Details: trace contractions noted, hand with IV bandaged limiting AROM attempts   Lower Extremity Assessment Lower Extremity Assessment: RLE  deficits/detail;Defer to PT evaluation RLE Deficits / Details: very limited AROM       Communication     Cognition Arousal: Alert Behavior During Therapy: Flat affect Overall Cognitive Status: Impaired/Different from baseline                                 General Comments: continues to demonstrate impaired problem solving, increased processing time, cues     General Comments       Exercises     Shoulder Instructions      Home Living Family/patient expects to be discharged to:: Private residence Living Arrangements: Children (son) Available Help at Discharge: Family;Available PRN/intermittently (son works, leaves the house around 3-4 AM) Type of Home: Mobile home Home Access: Stairs to enter [employer] of Steps: 4-5 Entrance Stairs-Rails: Right;Left;Can reach both Home Layout: One level     Bathroom Shower/Tub: Chief Strategy Officer: Standard     Home Equipment: Grab bars - tub/shower          Prior Functioning/Environment Prior Level of Function : Needs assist             Mobility Comments: reports furniture walking, thinks she has fallen before but not sure          OT Problem List: Decreased strength;Decreased cognition;Decreased safety awareness;Impaired balance (sitting and/or standing);Decreased knowledge of use of DME or AE;Obesity;Decreased activity tolerance      OT Treatment/Interventions: Self-care/ADL training;Therapeutic exercise;Therapeutic activities;Cognitive remediation/compensation;DME and/or AE instruction;Patient/family education;Balance training;Energy conservation    OT Goals(Current goals can be found in the care plan section) Acute Rehab OT Goals Patient Stated Goal: get better OT Goal Formulation: With patient Time For Goal Achievement: 11/08/23 Potential to Achieve Goals: Good  OT Frequency: Min 1X/week    Co-evaluation PT/OT/SLP Co-Evaluation/Treatment: Yes Reason for Co-Treatment:  Complexity of the patient's impairments (multi-system involvement);Necessary to address cognition/behavior during functional activity;For patient/therapist safety;To address functional/ADL transfers PT goals addressed during session: Mobility/safety with mobility;Balance OT goals addressed during session: ADL's and self-care      AM-PAC OT "6 Clicks" Daily Activity     Outcome Measure Help from another person eating meals?: A Little Help from another person taking care of personal grooming?: A Lot Help from another person toileting, which includes using toliet, bedpan, or urinal?: Total Help from another person bathing (including washing, rinsing, drying)?: Total Help from another person to put on and taking off regular upper body clothing?: A Lot Help from another person to put on and taking off regular lower body clothing?: Total 6 Click Score: 10   End of Session Equipment Utilized During Treatment: Oxygen Nurse Communication: Mobility status  Activity Tolerance: Patient tolerated treatment well Patient left: in bed;with call bell/phone within reach;with bed alarm set  OT Visit Diagnosis: Other abnormalities of gait and mobility (R26.89);Muscle weakness (generalized) (M62.81);Hemiplegia and hemiparesis Hemiplegia - Right/Left: Right Hemiplegia - dominant/non-dominant: Dominant Hemiplegia - caused by: Cerebral infarction  Time: 1610-9604 OT Time Calculation (min): 11 min Charges:  OT General Charges $OT Visit: 1 Visit OT Evaluation $OT Re-eval: 1 Re-eval  Arman Filter., MPH, MS, OTR/L ascom [phone] 10/25/23, 4:37 PM

## 2023-10-26 DIAGNOSIS — G9341 Metabolic encephalopathy: Secondary | ICD-10-CM | POA: Diagnosis not present

## 2023-10-26 LAB — BASIC METABOLIC PANEL
Anion gap: 12 (ref 5–15)
BUN: 31 mg/dL — ABNORMAL HIGH (ref 8–23)
CO2: 36 mmol/L — ABNORMAL HIGH (ref 22–32)
Calcium: 8.9 mg/dL (ref 8.9–10.3)
Chloride: 85 mmol/L — ABNORMAL LOW (ref 98–111)
Creatinine, Ser: 0.85 mg/dL (ref 0.44–1.00)
GFR, Estimated: >60 mL/min (ref >60)
Glucose, Bld: 222 mg/dL — ABNORMAL HIGH (ref 70–99)
Potassium: 4.4 mmol/L (ref 3.5–5.1)
Sodium: 133 mmol/L — ABNORMAL LOW (ref 135–145)

## 2023-10-26 LAB — CBC WITH DIFFERENTIAL/PLATELET
Abs Immature Granulocytes: 0.01 10*3/uL (ref 0.00–0.07)
Basophils Absolute: 0 10*3/uL (ref 0.0–0.1)
Basophils Relative: 0 %
Eosinophils Absolute: 0 10*3/uL (ref 0.0–0.5)
Eosinophils Relative: 0 %
HCT: 27.6 % — ABNORMAL LOW (ref 36.0–46.0)
Hemoglobin: 9.1 g/dL — ABNORMAL LOW (ref 12.0–15.0)
Immature Granulocytes: 0 %
Lymphocytes Relative: 7 %
Lymphs Abs: 0.3 10*3/uL — ABNORMAL LOW (ref 0.7–4.0)
MCH: 28.7 pg (ref 26.0–34.0)
MCHC: 33 g/dL (ref 30.0–36.0)
MCV: 87.1 fL (ref 80.0–100.0)
Monocytes Absolute: 0.1 10*3/uL (ref 0.1–1.0)
Monocytes Relative: 3 %
Neutro Abs: 3.8 10*3/uL (ref 1.7–7.7)
Neutrophils Relative %: 90 %
Platelets: 200 10*3/uL (ref 150–400)
RBC: 3.17 MIL/uL — ABNORMAL LOW (ref 3.87–5.11)
RDW: 14.2 % (ref 11.5–15.5)
WBC: 4.2 10*3/uL (ref 4.0–10.5)
nRBC: 0 % (ref 0.0–0.2)

## 2023-10-26 LAB — GLUCOSE, CAPILLARY
Glucose-Capillary: 196 mg/dL — ABNORMAL HIGH (ref 70–99)
Glucose-Capillary: 187 mg/dL — ABNORMAL HIGH (ref 70–99)
Glucose-Capillary: 275 mg/dL — ABNORMAL HIGH (ref 70–99)
Glucose-Capillary: 237 mg/dL — ABNORMAL HIGH (ref 70–99)

## 2023-10-26 LAB — MAGNESIUM: Magnesium: 2.1 mg/dL (ref 1.7–2.4)

## 2023-10-26 MED ORDER — IPRATROPIUM-ALBUTEROL 0.5-2.5 (3) MG/3ML IN SOLN
3.0000 mL | RESPIRATORY_TRACT | Status: AC | PRN
  Administered 2023-10-26 – 2023-10-27 (×3): 3 mL via RESPIRATORY_TRACT
  Filled 2023-10-26 (×3): qty 3

## 2023-10-26 MED ORDER — HYDROXYZINE HCL 25 MG PO TABS
25.0000 mg | ORAL_TABLET | Freq: Three times a day (TID) | ORAL | Status: DC | PRN
  Administered 2023-10-26 – 2023-10-28 (×3): 25 mg via ORAL
  Filled 2023-10-26 (×3): qty 1

## 2023-10-26 MED ORDER — FUROSEMIDE 10 MG/ML IJ SOLN
40.0000 mg | Freq: Once | INTRAMUSCULAR | Status: AC
  Administered 2023-10-26: 40 mg via INTRAVENOUS
  Filled 2023-10-26: qty 4

## 2023-10-26 NOTE — Progress Notes (Signed)
Notified Dr. Myriam Forehand that patient has some increased work of breathing with crackles. MD acknowledged and stated he will order Lasix.

## 2023-10-26 NOTE — Plan of Care (Signed)
  Problem: Education: Goal: Ability to describe self-care measures that may prevent or decrease complications (Diabetes Survival Skills Education) will improve Outcome: Progressing   Problem: Metabolic: Goal: Ability to maintain appropriate glucose levels will improve Outcome: Progressing   Problem: Skin Integrity: Goal: Risk for impaired skin integrity will decrease Outcome: Progressing   Problem: Tissue Perfusion: Goal: Adequacy of tissue perfusion will improve Outcome: Progressing   Problem: Clinical Measurements: Goal: Ability to maintain clinical measurements within normal limits will improve Outcome: Progressing   Problem: Clinical Measurements: Goal: Diagnostic test results will improve Outcome: Progressing   Problem: Clinical Measurements: Goal: Respiratory complications will improve Outcome: Progressing   Problem: Clinical Measurements: Goal: Cardiovascular complication will be avoided Outcome: Progressing   Problem: Activity: Goal: Risk for activity intolerance will decrease Outcome: Progressing   Problem: Respiratory: Goal: Ability to maintain adequate ventilation will improve Outcome: Progressing

## 2023-10-26 NOTE — Progress Notes (Signed)
Progress Note    Michele House  YNW:[id number] DOB: 26-Aug-1956  DOA: 10/02/2023 PCP: [facility]      Brief Narrative:    Medical records reviewed and are as summarized below:  Michele House is a 67 y.o. female with medical history significant of COPD, chronic respiratory failure on 2 L, hypertension, type 2 diabetes, HFpEF, recent discharge from the hospital 24th November 2024 after hospitalization for COPD exacerbation complicated by respiratory failure.  She presented to the hospital again on 10/02/2023 with chest pain, malaise, cough, shortness of breath, abdominal pain and dysuria.  She was admitted to the hospital for COPD exacerbation, acute on chronic hypoxemic respiratory failure, UTI and pneumonia.  She became more confused in the hospital and MRI brain revealed acute stroke.        12/1: Vital stable, respiratory viral panel negative, procalcitonin negative, preliminary blood cultures negative.  Urine cultures are ordered as add-on, ammonia levels mildly elevated at 39, strep pneumo negative, CBG elevated at 244, history of enlarging pulmonary nodule with recommendations for PET/CT during most recent admission which has not been done yet, if video bronchoscopy was scheduled for 10/13/2023 as outpatient.  Unable to take care of himself and son cannot provide the required care as she required 24-hour supervision due to worsening dementia.  PT is recommending SNF.  12/2: Hemodynamically stable, now on baseline oxygen of 2 L.  Needs SNF placement. Urine cultures pending.  12/3: Blood pressure started trending up, restarting home Zestoretic.  Urine cultures with strep agalactiae, penicillin allergy noted, will continue with ceftriaxone for now.  Also started on Remeron for concern of worsening depression and unable to sleep at night  12/4.  Some very concerned about patient's mental status not being seen.  Patient receiving antibiotics already  and should have improved by now.  Will hold Lipitor.  Restart low-dose Zoloft.  Discontinue Remeron and do Seroquel at night. 12/5.  Patient has some blurry vision and weakness and pain.  MRI of the brain negative for acute intracranial abnormality. 12/6.  Patient stated she slept well.  Patient able to lift up her arms up off the bed today.  Patient thought she heard her son's voice outside the room. 12/7.  Patient sitting up in chair when I saw her.  Was able to feed herself breakfast.  Felt okay.  Did not offer any complaints. 12/8.  Patient more confused today than yesterday.  Able to follow some simple commands but not as good of a conversation today as yesterday. 12/9.  Patient still confused today.  Does not follow simple commands today. 12/10.  Patient more talkative today but was looking up at the ceiling when she was talking with me.  She does not move her extremities to command but does move them on her own.  Physical therapy and Occupational Therapy did not see her move her right arm. 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  Mild associated petechial hemorrhage.  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 12/12: Patient with new stroke, CTA head and neck with no large vessel occlusion.  Patient had a recent echocardiogram which was normal. CSF cultures remain negative.  Lipid panel with increased triglyceride at 347, HDL 29 and LDL of 38.  CBC with mild thrombocytopenia at 130, BMP mostly unremarkable.  Patient with significant right-sided weakness. Concern of paraneoplastic versus hypercoagulability secondary to malignancy.  Patient missed her appointment for bronchoscopy and outpatient appointment for PET scan due to recurrent hospitalizations. Involving pulmonary and oncology for their input. Send  out encephalitis panel still pending.  12/13: Patient continued to have waxing and waning mental status, unable to move right upper and lower extremities.  Having some hallucination.  Pulmonary is planning lung biopsy on 12/27 if she remains stable.  Rapidly progressive dementia and now with underlying stroke, question of paraneoplastic encephalitis and hypercoagulability secondary to underlying undiagnosed malignancy. Patient will also get benefit from neuropsych evaluation as outpatient.  12/14: Patient with some improved mentation today.  Trying to squeeze with right hand but still no movements of right lower extremity.  12/15: Patient with much improved mentation.  Having some flickering of movements on right upper and lower extremity today, left extremities weaker but seems improving.   12/16: Mental status seems stable, flaccid right upper and lower extremities.  Complaining of muscle spasms so Robaxin was ordered.  Pulmonary is trying to do bronchoscopy and biopsy on Wednesday.      Assessment/Plan:   Principal Problem:   Acute metabolic encephalopathy Active Problems:   CVA (cerebral vascular accident) (HCC)   Uncontrolled type 2 diabetes mellitus with hypoglycemia, without long-term current use of insulin (HCC)   Hypotension   Acute on chronic respiratory failure with hypoxia (HCC)   Hypernatremia   COPD exacerbation (HCC)   Lung nodule   Pneumonia   UTI (urinary tract infection)   (HFpEF) heart failure with preserved ejection fraction (HCC)   Essential hypertension   Type 2 diabetes mellitus with obesity (HCC)    Body mass index is 30.92 kg/m.   Acute metabolic encephalopathy: Improved. She completed 5 days of high-dose several IV Solu-Medrol on 10/12/2023 Differential diagnosis include autoimmune encephalitis   Acute stroke with right hemiplegia: Continue Plavix.  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   Type II DM with hyperglycemia: Continue Semglee and NovoLog    Hypotension: Improved   Pneumonia: Completed 5 days of IV ceftriaxone and azithromycin Acute UTI: Urine culture showed strep agalactiae.  Completed IV ceftriaxone.   COPD exacerbation: This had previously resolved.  However, she had recurrent COPD exacerbation and was restarted on prednisone on 10/25/2023.     Acute on chronic hypoxemic respiratory failure, chronic hypercapnic respiratory failure: She was started on BiPAP on 10/24/2023 for increased work of breathing and worsening respiratory failure. She is now on 4 L/min oxygen via [place].  Continue BiPAP at night for chronic respiratory failure.   1.3 cm right upper lung spiculated nodule: S/p bronchoscopy on 10/20/2023.   Pathology was negative for malignancy and findings more consistent with reactive bronchial cells with acute inflammation.   Diet Order             DIET DYS 3 Room service appropriate? Yes; Fluid consistency: Thin  Diet effective now                            Consultants: Pulmonologist Hematologist  Procedures: Lumbar puncture on 10/08/2023 Bronchoscopy on 10/20/2023    Medications:    clopidogrel  75 mg Oral Daily   enoxaparin (LOVENOX) injection  40 mg Subcutaneous Daily   insulin aspart  0-15  Units Subcutaneous TID WC   insulin aspart  0-5 Units Subcutaneous QHS   insulin glargine-yfgn  18 Units Subcutaneous QHS   ipratropium-albuterol  3 mL Nebulization Q4H while awake   levothyroxine  100 mcg Oral Q0600   predniSONE  40 mg Oral Q breakfast   ramelteon  8 mg Oral QHS   rOPINIRole  4 mg Oral QHS   senna-docusate  1 tablet Oral Daily   sertraline  25 mg Oral Daily   Continuous Infusions:   Anti-infectives (From admission, onward)    Start     Dose/Rate Route Frequency  Ordered Stop   10/04/23 1000  azithromycin (ZITHROMAX) tablet 500 mg        500 mg Oral Daily 10/03/23 0751 10/06/23 0856   10/03/23 0600  cefTRIAXone (ROCEPHIN) 2 g in sodium chloride 0.9 % 100 mL IVPB  Status:  Discontinued        2 g 200 mL/hr over 30 Minutes Intravenous Every 24 hours 10/02/23 0740 10/07/23 0854   10/02/23 0745  cefTRIAXone (ROCEPHIN) 2 g in sodium chloride 0.9 % 100 mL IVPB  Status:  Discontinued        2 g 200 mL/hr over 30 Minutes Intravenous Every 24 hours 10/02/23 0738 10/02/23 0740   10/02/23 0745  azithromycin (ZITHROMAX) 500 mg in sodium chloride 0.9 % 250 mL IVPB        500 mg 250 mL/hr over 60 Minutes Intravenous Every 24 hours 10/02/23 0738 10/03/23 1236   10/02/23 0630  cefTRIAXone (ROCEPHIN) 2 g in sodium chloride 0.9 % 100 mL IVPB        2 g 200 mL/hr over 30 Minutes Intravenous  Once 10/02/23 0617 10/02/23 0714              Family Communication/Anticipated D/C date and plan/Code Status   DVT prophylaxis: enoxaparin (LOVENOX) injection 40 mg Start: 10/12/23 1115 SCDs Start: 10/02/23 4540     Code Status: Full Code  Family Communication: None Disposition Plan: Plan to discharge to SNF    Status is: Inpatient Remains inpatient appropriate because: Awaiting placement to SNF       Subjective:   Interval events noted.  She was BiPAP from overnight when I saw her this morning.  I went back to see her when BiPAP had been taken off.  She has no complaints.  She feels better.  Objective:    Vitals:   10/25/23 2355 10/26/23 0257 10/26/23 0737 10/26/23 0800  BP: 133/74   134/61  Pulse:    89  Resp:    17  Temp: 98.1 F (36.7 C) 98 F (36.7 C) 98 F (36.7 C)   TempSrc: Oral Oral Axillary   SpO2:  100% 100% 100%  Weight:      Height:       No data found.  No intake or output data in the 24 hours ending 10/26/23 1018  Filed Weights   10/05/23 0912 10/20/23 0732  Weight: 74.1 kg 74.1 kg    Exam:  GEN: NAD, on  BiPAP SKIN: Warm and dry EYES: No pallor or icterus ENT: MMM CV: RRR PULM: CTA B ABD: soft, obese, NT, +BS CNS: AAO x 3, right hemiplegia EXT: No edema or tenderness       Data Reviewed:   I have personally reviewed following labs and imaging studies:  Labs: Labs show the following:   Basic Metabolic Panel: Recent Labs  Lab 10/19/23 1252 10/23/23 0733 10/24/23 9811 10/25/23 0415 10/26/23 9147  NA 135 134* 132* 135 133*  K 4.5 4.1 4.1 4.0 4.4  CL 93* 88* 87* 85* 85*  CO2 33* 36* 34* 34* 36*  GLUCOSE 201* 198* 235* 168* 222*  BUN 13 19 23  27* 31*  CREATININE 0.81 0.77 0.77 1.02* 0.85  CALCIUM 8.9 8.9 8.9 8.9 8.9  MG  --   --  2.0 2.1 2.1  PHOS  --   --  3.6  --   --    GFR Estimated Creatinine Clearance: 57.5 mL/min (by C-G formula based on SCr of 0.85 mg/dL). Liver Function Tests: Recent Labs  Lab 10/19/23 1252 10/24/23 0614  AST 18 22  ALT 18 20  ALKPHOS 70 72  BILITOT <0.2 0.7  PROT 5.4* 6.0*  ALBUMIN 3.1* 2.8*   No results for input(s): "LIPASE", "AMYLASE" in the last 168 hours. No results for input(s): "AMMONIA" in the last 168 hours.  Coagulation profile No results for input(s): "INR", "PROTIME" in the last 168 hours.  CBC: Recent Labs  Lab 10/19/23 1252 10/23/23 0733 10/24/23 0614 10/26/23 0313  WBC 6.7 6.1 6.9 4.2  NEUTROABS 5.3  --  6.1 3.8  HGB 9.6* 9.7* 9.6* 9.1*  HCT 29.5* 29.9* 29.3* 27.6*  MCV 90.8 89.5 88.8 87.1  PLT 188 209 203 200   Cardiac Enzymes: No results for input(s): "CKTOTAL", "CKMB", "CKMBINDEX", "TROPONINI" in the last 168 hours. BNP (last 3 results) No results for input(s): "PROBNP" in the last 8760 hours. CBG: Recent Labs  Lab 10/25/23 0730 10/25/23 1221 10/25/23 1625 10/25/23 2106 10/26/23 0746  GLUCAP 182* 199* 126* 271* 196*   D-Dimer: No results for input(s): "DDIMER" in the last 72 hours. Hgb A1c: No results for input(s): "HGBA1C" in the last 72 hours. Lipid Profile: No results for input(s):  "CHOL", "HDL", "LDLCALC", "TRIG", "CHOLHDL", "LDLDIRECT" in the last 72 hours. Thyroid function studies: No results for input(s): "TSH", "T4TOTAL", "T3FREE", "THYROIDAB" in the last 72 hours.  Invalid input(s): "FREET3" Anemia work up: No results for input(s): "VITAMINB12", "FOLATE", "FERRITIN", "TIBC", "IRON", "RETICCTPCT" in the last 72 hours. Sepsis Labs: Recent Labs  Lab 10/19/23 1252 10/23/23 0733 10/24/23 0614 10/26/23 0313  WBC 6.7 6.1 6.9 4.2    Microbiology Recent Results (from the past 240 hours)  Culture, BAL-quantitative w Gram Stain     Status: Abnormal   Collection Time: 10/20/23  9:49 AM   Specimen: Bronchoalveolar Lavage; Respiratory  Result Value Ref Range Status   Specimen Description   Final    BRONCHIAL ALVEOLAR LAVAGE Performed at [facility], [address] [place], [place] [address]    Special Requests   Final    NONE Performed at [facility], [address] [place], [place] [address]    Gram Stain   Final    RARE WBC PRESENT,BOTH PMN AND MONONUCLEAR RARE GRAM POSITIVE RODS    Culture (A)  Final    20,000 COLONIES/mL Consistent with normal respiratory flora. Performed at [facility], [address] [place], [place] [address]    Report Status 10/22/2023 FINAL  Final  Respiratory (~20 pathogens) panel by PCR     Status: Abnormal   Collection Time: 10/25/23  4:15 PM   Specimen: Nasopharyngeal Swab; Respiratory  Result Value Ref Range Status   Adenovirus NOT DETECTED NOT DETECTED Final   Coronavirus 229E NOT DETECTED NOT DETECTED Final    Comment: (NOTE) The Coronavirus on the Respiratory Panel, DOES NOT test for the novel  Coronavirus (2019 nCoV)  Coronavirus HKU1 NOT DETECTED NOT DETECTED Final   Coronavirus NL63 NOT DETECTED NOT DETECTED Final   Coronavirus OC43 NOT DETECTED NOT DETECTED Final   Metapneumovirus DETECTED (A) NOT DETECTED Final   Rhinovirus / Enterovirus NOT DETECTED NOT DETECTED Final    Influenza A NOT DETECTED NOT DETECTED Final   Influenza B NOT DETECTED NOT DETECTED Final   Parainfluenza Virus 1 NOT DETECTED NOT DETECTED Final   Parainfluenza Virus 2 NOT DETECTED NOT DETECTED Final   Parainfluenza Virus 3 NOT DETECTED NOT DETECTED Final   Parainfluenza Virus 4 NOT DETECTED NOT DETECTED Final   Respiratory Syncytial Virus NOT DETECTED NOT DETECTED Final   Bordetella pertussis NOT DETECTED NOT DETECTED Final   Bordetella Parapertussis NOT DETECTED NOT DETECTED Final   Chlamydophila pneumoniae NOT DETECTED NOT DETECTED Final   Mycoplasma pneumoniae NOT DETECTED NOT DETECTED Final    Comment: Performed at [facility], [address], [place], [place] [address]  SARS Coronavirus 2 by RT PCR (hospital order, performed in [facility] hospital lab) *cepheid single result test* Anterior Nasal Swab     Status: None   Collection Time: 10/25/23  4:15 PM   Specimen: Anterior Nasal Swab  Result Value Ref Range Status   SARS Coronavirus 2 by RT PCR NEGATIVE NEGATIVE Final    Comment: (NOTE) SARS-CoV-2 target nucleic acids are NOT DETECTED.  The SARS-CoV-2 RNA is generally detectable in upper and lower respiratory specimens during the acute phase of infection. The lowest concentration of SARS-CoV-2 viral copies this assay can detect is 250 copies / mL. A negative result does not preclude SARS-CoV-2 infection and should not be used as the sole basis for treatment or other patient management decisions.  A negative result may occur with improper specimen collection / handling, submission of specimen other than nasopharyngeal swab, presence of viral mutation(s) within the areas targeted by this assay, and inadequate number of viral copies (<250 copies / mL). A negative result must be combined with clinical observations, patient history, and epidemiological information.  Fact Sheet for Patients:   RoadLapTop.co.za  Fact Sheet for Healthcare  Providers: http://kim-miller.com/  This test is not yet approved or  cleared by the [place] FDA and has been authorized for detection and/or diagnosis of SARS-CoV-2 by FDA under an Emergency Use Authorization (EUA).  This EUA will remain in effect (meaning this test can be used) for the duration of the COVID-19 declaration under Section 564(b)(1) of the Act, 21 U.S.C. section 360bbb-3(b)(1), unless the authorization is terminated or revoked sooner.  Performed at [facility], [address] [place], [place] [address]     Procedures and diagnostic studies:  DG Chest [facility] 1 View Result Date: 10/24/2023 CLINICAL DATA:  Shortness of breath with recent rapid response. EXAM: PORTABLE CHEST 1 VIEW COMPARISON:  10/20/2023 FINDINGS: Lungs are adequately inflated with mild prominence of the infrahilar bronchovascular markings which may be due to minimal vascular congestion versus viral bronchopneumonia. No airspace consolidation, effusion or pneumothorax. Cardiomediastinal silhouette and remainder of the exam is unchanged. IMPRESSION: Mild prominence of the infrahilar bronchovascular markings which may be due to minimal vascular congestion versus viral bronchopneumonia. Electronically Signed   By: Elberta Fortis M.D.   On: 10/24/2023 19:48               LOS: 24 days   Lilienne Weins  [facility]   Pager on [email or web address]. If 7PM-7AM, please contact night-coverage at [email or web address]     10/26/2023, 10:18 AM

## 2023-10-26 NOTE — TOC Progression Note (Addendum)
Transition of Care [facility]) - Progression Note    Patient Details  Name: Michele House MRN: [id number] Date of Birth: 09/03/1956  [facility] of [facility]) CM/SW Contact  Margarito Liner, LCSW Phone Number: 10/26/2023, 12:35 PM  Clinical Narrative:  CSW left message for [facility] admissions coordinator to let her know that patient is now using bipap.   1:29 pm: Per MD, patient is stable for discharge and will need BIPAP. [facility] admissions coordinator will order. They should have it by Thursday. CSW restarted auth.  Expected Discharge Plan: Skilled Nursing Facility Barriers to Discharge: Continued Medical Work up  Expected Discharge Plan and Services                                               Social Determinants of Health (SDOH) Interventions SDOH Screenings   Food Insecurity: Patient Unable To Answer (10/03/2023)  Recent Concern: Food Insecurity - Food Insecurity Present (09/24/2023)  Housing: High Risk (10/03/2023)  Transportation Needs: Patient Unable To Answer (10/03/2023)  Recent Concern: Transportation Needs - Unmet Transportation Needs (09/24/2023)  Utilities: Patient Unable To Answer (10/03/2023)  Tobacco Use: Medium Risk (10/20/2023)    Readmission Risk Interventions     No data to display

## 2023-10-27 DIAGNOSIS — G9341 Metabolic encephalopathy: Secondary | ICD-10-CM | POA: Diagnosis not present

## 2023-10-27 LAB — GLUCOSE, CAPILLARY
Glucose-Capillary: 222 mg/dL — ABNORMAL HIGH (ref 70–99)
Glucose-Capillary: 280 mg/dL — ABNORMAL HIGH (ref 70–99)
Glucose-Capillary: 377 mg/dL — ABNORMAL HIGH (ref 70–99)
Glucose-Capillary: 318 mg/dL — ABNORMAL HIGH (ref 70–99)

## 2023-10-27 MED ORDER — FUROSEMIDE 20 MG PO TABS
20.0000 mg | ORAL_TABLET | Freq: Every day | ORAL | Status: DC
  Administered 2023-10-27 – 2023-10-28 (×2): 20 mg via ORAL
  Filled 2023-10-27 (×2): qty 1

## 2023-10-27 MED ORDER — IPRATROPIUM-ALBUTEROL 0.5-2.5 (3) MG/3ML IN SOLN: 3.0000 mL | RESPIRATORY_TRACT | Status: DC | PRN

## 2023-10-27 NOTE — Progress Notes (Signed)
Progress Note    Michele House  WUJ:[id number] DOB: 03-24-1956  DOA: 10/02/2023 PCP: [facility]      Brief Narrative:    Medical records reviewed and are as summarized below:  Michele House is a 67 y.o. female with medical history significant of COPD, chronic respiratory failure on 2 L, hypertension, type 2 diabetes, HFpEF, recent discharge from the hospital 24th November 2024 after hospitalization for COPD exacerbation complicated by respiratory failure.  She presented to the hospital again on 10/02/2023 with chest pain, malaise, cough, shortness of breath, abdominal pain and dysuria.  She was admitted to the hospital for COPD exacerbation, acute on chronic hypoxemic respiratory failure, UTI and pneumonia.  She became more confused in the hospital and MRI brain revealed acute stroke.        12/1: Vital stable, respiratory viral panel negative, procalcitonin negative, preliminary blood cultures negative.  Urine cultures are ordered as add-on, ammonia levels mildly elevated at 39, strep pneumo negative, CBG elevated at 244, history of enlarging pulmonary nodule with recommendations for PET/CT during most recent admission which has not been done yet, if video bronchoscopy was scheduled for 10/13/2023 as outpatient.  Unable to take care of himself and son cannot provide the required care as she required 24-hour supervision due to worsening dementia.  PT is recommending SNF.  12/2: Hemodynamically stable, now on baseline oxygen of 2 L.  Needs SNF placement. Urine cultures pending.  12/3: Blood pressure started trending up, restarting home Zestoretic.  Urine cultures with strep agalactiae, penicillin allergy noted, will continue with ceftriaxone for now.  Also started on Remeron for concern of worsening depression and unable to sleep at night  12/4.  Some very concerned about patient's mental status not being seen.  Patient receiving antibiotics already  and should have improved by now.  Will hold Lipitor.  Restart low-dose Zoloft.  Discontinue Remeron and do Seroquel at night. 12/5.  Patient has some blurry vision and weakness and pain.  MRI of the brain negative for acute intracranial abnormality. 12/6.  Patient stated she slept well.  Patient able to lift up her arms up off the bed today.  Patient thought she heard her son's voice outside the room. 12/7.  Patient sitting up in chair when I saw her.  Was able to feed herself breakfast.  Felt okay.  Did not offer any complaints. 12/8.  Patient more confused today than yesterday.  Able to follow some simple commands but not as good of a conversation today as yesterday. 12/9.  Patient still confused today.  Does not follow simple commands today. 12/10.  Patient more talkative today but was looking up at the ceiling when she was talking with me.  She does not move her extremities to command but does move them on her own.  Physical therapy and Occupational Therapy did not see her move her right arm. 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  Mild associated petechial hemorrhage.  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 12/12: Patient with new stroke, CTA head and neck with no large vessel occlusion.  Patient had a recent echocardiogram which was normal. CSF cultures remain negative.  Lipid panel with increased triglyceride at 347, HDL 29 and LDL of 38.  CBC with mild thrombocytopenia at 130, BMP mostly unremarkable.  Patient with significant right-sided weakness. Concern of paraneoplastic versus hypercoagulability secondary to malignancy.  Patient missed her appointment for bronchoscopy and outpatient appointment for PET scan due to recurrent hospitalizations. Involving pulmonary and oncology for their input. Send  out encephalitis panel still pending.  12/13: Patient continued to have waxing and waning mental status, unable to move right upper and lower extremities.  Having some hallucination.  Pulmonary is planning lung biopsy on 12/27 if she remains stable.  Rapidly progressive dementia and now with underlying stroke, question of paraneoplastic encephalitis and hypercoagulability secondary to underlying undiagnosed malignancy. Patient will also get benefit from neuropsych evaluation as outpatient.  12/14: Patient with some improved mentation today.  Trying to squeeze with right hand but still no movements of right lower extremity.  12/15: Patient with much improved mentation.  Having some flickering of movements on right upper and lower extremity today, left extremities weaker but seems improving.   12/16: Mental status seems stable, flaccid right upper and lower extremities.  Complaining of muscle spasms so Robaxin was ordered.  Pulmonary is trying to do bronchoscopy and biopsy on Wednesday.      Assessment/Plan:   Principal Problem:   Acute metabolic encephalopathy Active Problems:   CVA (cerebral vascular accident) (HCC)   Uncontrolled type 2 diabetes mellitus with hypoglycemia, without long-term current use of insulin (HCC)   Hypotension   Acute on chronic respiratory failure with hypoxia (HCC)   Hypernatremia   COPD exacerbation (HCC)   Lung nodule   Pneumonia   UTI (urinary tract infection)   (HFpEF) heart failure with preserved ejection fraction (HCC)   Essential hypertension   Type 2 diabetes mellitus with obesity (HCC)    Body mass index is 30.92 kg/m.   Acute metabolic encephalopathy: Improved. She completed 5 days of high-dose several IV Solu-Medrol on 10/12/2023 Differential diagnosis include autoimmune encephalitis   Acute stroke with right hemiplegia: Continue Plavix.  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   Type II DM with hyperglycemia: Continue Semglee and NovoLog    Hypotension: Improved   Pneumonia: Completed 5 days of IV ceftriaxone and azithromycin Acute UTI: Urine culture showed strep agalactiae.  Completed IV ceftriaxone.   Acute on chronic diastolic CHF: Required treatment with IV Lasix.  Restart home Lasix   COPD exacerbation: This had previously resolved.  However, she had recurrent COPD exacerbation and was restarted on prednisone on 10/25/2023.  Continue prednisone through 10/29/2023.   Acute on chronic hypoxemic respiratory failure, chronic hypercapnic respiratory failure: She was started on BiPAP on 10/24/2023 for increased work of breathing and worsening respiratory failure. She is now on 4 L/min oxygen via [place].  Continue BiPAP at night for chronic respiratory failure.   1.3 cm right upper lung spiculated nodule: S/p bronchoscopy on 10/20/2023.   Pathology was negative for malignancy and findings more consistent with reactive bronchial cells with acute inflammation.   Diet Order             DIET DYS 3 Room service appropriate? Yes; Fluid consistency: Thin  Diet effective now                            Consultants: Pulmonologist Hematologist  Procedures: Lumbar puncture on 10/08/2023 Bronchoscopy on 10/20/2023    Medications:    clopidogrel  75  mg Oral Daily   enoxaparin (LOVENOX) injection  40 mg Subcutaneous Daily   insulin aspart  0-15 Units Subcutaneous TID WC   insulin aspart  0-5 Units Subcutaneous QHS   insulin glargine-yfgn  18 Units Subcutaneous QHS   levothyroxine  100 mcg Oral Q0600   predniSONE  40 mg Oral Q breakfast   ramelteon  8 mg Oral QHS   rOPINIRole  4 mg Oral QHS   senna-docusate  1 tablet Oral Daily   sertraline  25 mg Oral Daily   Continuous  Infusions:   Anti-infectives (From admission, onward)    Start     Dose/Rate Route Frequency Ordered Stop   10/04/23 1000  azithromycin (ZITHROMAX) tablet 500 mg        500 mg Oral Daily 10/03/23 0751 10/06/23 0856   10/03/23 0600  cefTRIAXone (ROCEPHIN) 2 g in sodium chloride 0.9 % 100 mL IVPB  Status:  Discontinued        2 g 200 mL/hr over 30 Minutes Intravenous Every 24 hours 10/02/23 0740 10/07/23 0854   10/02/23 0745  cefTRIAXone (ROCEPHIN) 2 g in sodium chloride 0.9 % 100 mL IVPB  Status:  Discontinued        2 g 200 mL/hr over 30 Minutes Intravenous Every 24 hours 10/02/23 0738 10/02/23 0740   10/02/23 0745  azithromycin (ZITHROMAX) 500 mg in sodium chloride 0.9 % 250 mL IVPB        500 mg 250 mL/hr over 60 Minutes Intravenous Every 24 hours 10/02/23 0738 10/03/23 1236   10/02/23 0630  cefTRIAXone (ROCEPHIN) 2 g in sodium chloride 0.9 % 100 mL IVPB        2 g 200 mL/hr over 30 Minutes Intravenous  Once 10/02/23 0617 10/02/23 0714              Family Communication/Anticipated D/C date and plan/Code Status   DVT prophylaxis: enoxaparin (LOVENOX) injection 40 mg Start: 10/12/23 1115 SCDs Start: 10/02/23 1610     Code Status: Full Code  Family Communication: None Disposition Plan: Plan to discharge to SNF    Status is: Inpatient Remains inpatient appropriate because: Awaiting placement to SNF       Subjective:   Interval events noted.  She said she had trouble breathing  Objective:    Vitals:   10/26/23 2255 10/27/23 0027 10/27/23 0317 10/27/23 1200  BP:  (!) 146/71 136/66   Pulse: 92     Resp: 13     Temp:  98.8 F (37.1 C) 98.7 F (37.1 C) 97.9 F (36.6 C)  TempSrc:  Oral Oral   SpO2: 100%     Weight:      Height:       No data found.   Intake/Output Summary (Last 24 hours) at 10/27/2023 1413 Last data filed at 10/27/2023 0536 Gross per 24 hour  Intake --  Output 850 ml  Net -850 ml    Filed Weights   10/05/23 0912 10/20/23  0732  Weight: 74.1 kg 74.1 kg    Exam:  GEN: NAD SKIN: Warm and dry EYES: No pallor or icterus ENT: MMM CV: RRR PULM: CTA B ABD: soft, ND, NT, +BS CNS: AAO x 3, right hemiplegia EXT: No edema or tenderness       Data Reviewed:   I have personally reviewed following labs and imaging studies:  Labs: Labs show the following:   Basic Metabolic Panel: Recent Labs  Lab 10/23/23 0733 10/24/23 9604 10/25/23 0415 10/26/23 5409  NA 134* 132* 135 133*  K 4.1 4.1 4.0 4.4  CL 88* 87* 85* 85*  CO2 36* 34* 34* 36*  GLUCOSE 198* 235* 168* 222*  BUN 19 23 27* 31*  CREATININE 0.77 0.77 1.02* 0.85  CALCIUM 8.9 8.9 8.9 8.9  MG  --  2.0 2.1 2.1  PHOS  --  3.6  --   --    GFR Estimated Creatinine Clearance: 57.5 mL/min (by C-G formula based on SCr of 0.85 mg/dL). Liver Function Tests: Recent Labs  Lab 10/24/23 0614  AST 22  ALT 20  ALKPHOS 72  BILITOT 0.7  PROT 6.0*  ALBUMIN 2.8*   No results for input(s): "LIPASE", "AMYLASE" in the last 168 hours. No results for input(s): "AMMONIA" in the last 168 hours.  Coagulation profile No results for input(s): "INR", "PROTIME" in the last 168 hours.  CBC: Recent Labs  Lab 10/23/23 0733 10/24/23 0614 10/26/23 0313  WBC 6.1 6.9 4.2  NEUTROABS  --  6.1 3.8  HGB 9.7* 9.6* 9.1*  HCT 29.9* 29.3* 27.6*  MCV 89.5 88.8 87.1  PLT 209 203 200   Cardiac Enzymes: No results for input(s): "CKTOTAL", "CKMB", "CKMBINDEX", "TROPONINI" in the last 168 hours. BNP (last 3 results) No results for input(s): "PROBNP" in the last 8760 hours. CBG: Recent Labs  Lab 10/26/23 1153 10/26/23 1638 10/26/23 2141 10/27/23 0905 10/27/23 1219  GLUCAP 187* 275* 237* 222* 280*   D-Dimer: No results for input(s): "DDIMER" in the last 72 hours. Hgb A1c: No results for input(s): "HGBA1C" in the last 72 hours. Lipid Profile: No results for input(s): "CHOL", "HDL", "LDLCALC", "TRIG", "CHOLHDL", "LDLDIRECT" in the last 72 hours. Thyroid  function studies: No results for input(s): "TSH", "T4TOTAL", "T3FREE", "THYROIDAB" in the last 72 hours.  Invalid input(s): "FREET3" Anemia work up: No results for input(s): "VITAMINB12", "FOLATE", "FERRITIN", "TIBC", "IRON", "RETICCTPCT" in the last 72 hours. Sepsis Labs: Recent Labs  Lab 10/23/23 0733 10/24/23 0614 10/26/23 0313  WBC 6.1 6.9 4.2    Microbiology Recent Results (from the past 240 hours)  Culture, BAL-quantitative w Gram Stain     Status: Abnormal   Collection Time: 10/20/23  9:49 AM   Specimen: Bronchoalveolar Lavage; Respiratory  Result Value Ref Range Status   Specimen Description   Final    BRONCHIAL ALVEOLAR LAVAGE Performed at [facility], [address] [place], [place] [address]    Special Requests   Final    NONE Performed at [facility], [address] [place], [place] [address]    Gram Stain   Final    RARE WBC PRESENT,BOTH PMN AND MONONUCLEAR RARE GRAM POSITIVE RODS    Culture (A)  Final    20,000 COLONIES/mL Consistent with normal respiratory flora. Performed at [facility], [address] [place], [place] [address]    Report Status 10/22/2023 FINAL  Final  Respiratory (~20 pathogens) panel by PCR     Status: Abnormal   Collection Time: 10/25/23  4:15 PM   Specimen: Nasopharyngeal Swab; Respiratory  Result Value Ref Range Status   Adenovirus NOT DETECTED NOT DETECTED Final   Coronavirus 229E NOT DETECTED NOT DETECTED Final    Comment: (NOTE) The Coronavirus on the Respiratory Panel, DOES NOT test for the novel  Coronavirus (2019 nCoV)    Coronavirus HKU1 NOT DETECTED NOT DETECTED Final   Coronavirus NL63 NOT DETECTED NOT DETECTED Final   Coronavirus OC43 NOT DETECTED NOT DETECTED Final   Metapneumovirus DETECTED (A) NOT  DETECTED Final   Rhinovirus / Enterovirus NOT DETECTED NOT DETECTED Final   Influenza A NOT DETECTED NOT DETECTED Final   Influenza B NOT DETECTED NOT DETECTED Final   Parainfluenza  Virus 1 NOT DETECTED NOT DETECTED Final   Parainfluenza Virus 2 NOT DETECTED NOT DETECTED Final   Parainfluenza Virus 3 NOT DETECTED NOT DETECTED Final   Parainfluenza Virus 4 NOT DETECTED NOT DETECTED Final   Respiratory Syncytial Virus NOT DETECTED NOT DETECTED Final   Bordetella pertussis NOT DETECTED NOT DETECTED Final   Bordetella Parapertussis NOT DETECTED NOT DETECTED Final   Chlamydophila pneumoniae NOT DETECTED NOT DETECTED Final   Mycoplasma pneumoniae NOT DETECTED NOT DETECTED Final    Comment: Performed at [facility], [address] [place], [place] [address]  SARS Coronavirus 2 by RT PCR (hospital order, performed in [facility] hospital lab) *cepheid single result test* Anterior Nasal Swab     Status: None   Collection Time: 10/25/23  4:15 PM   Specimen: Anterior Nasal Swab  Result Value Ref Range Status   SARS Coronavirus 2 by RT PCR NEGATIVE NEGATIVE Final    Comment: (NOTE) SARS-CoV-2 target nucleic acids are NOT DETECTED.  The SARS-CoV-2 RNA is generally detectable in upper and lower respiratory specimens during the acute phase of infection. The lowest concentration of SARS-CoV-2 viral copies this assay can detect is 250 copies / mL. A negative result does not preclude SARS-CoV-2 infection and should not be used as the sole basis for treatment or other patient management decisions.  A negative result may occur with improper specimen collection / handling, submission of specimen other than nasopharyngeal swab, presence of viral mutation(s) within the areas targeted by this assay, and inadequate number of viral copies (<250 copies / mL). A negative result must be combined with clinical observations, patient history, and epidemiological information.  Fact Sheet for Patients:   RoadLapTop.co.za  Fact Sheet for Healthcare Providers: http://kim-miller.com/  This test is not yet approved or  cleared by the [place] FDA and has been authorized for detection and/or diagnosis of SARS-CoV-2 by FDA under an Emergency Use Authorization (EUA).  This EUA will remain in effect (meaning this test can be used) for the duration of the COVID-19 declaration under Section 564(b)(1) of the Act, 21 U.S.C. section 360bbb-3(b)(1), unless the authorization is terminated or revoked sooner.  Performed at [facility], [address] [place], [place] [address]     Procedures and diagnostic studies:  No results found.              LOS: 25 days   Nakeshia Waldeck  [employer] loss adjuster on [email or web address]. If 7PM-7AM, please contact night-coverage at [email or web address]     10/27/2023, 2:13 PM

## 2023-10-27 NOTE — Plan of Care (Signed)
  Problem: Education: Goal: Ability to describe self-care measures that may prevent or decrease complications (Diabetes Survival Skills Education) will improve Outcome: Progressing Goal: Individualized Educational Video(s) Outcome: Progressing   Problem: Coping: Goal: Ability to adjust to condition or change in health will improve Outcome: Progressing   Problem: Fluid Volume: Goal: Ability to maintain a balanced intake and output will improve Outcome: Progressing   Problem: Health Behavior/Discharge Planning: Goal: Ability to identify and utilize available resources and services will improve Outcome: Progressing Goal: Ability to manage health-related needs will improve Outcome: Progressing   Problem: Metabolic: Goal: Ability to maintain appropriate glucose levels will improve Outcome: Progressing   Problem: Nutritional: Goal: Maintenance of adequate nutrition will improve Outcome: Progressing Goal: Progress toward achieving an optimal weight will improve Outcome: Progressing   Problem: Skin Integrity: Goal: Risk for impaired skin integrity will decrease Outcome: Progressing   Problem: Tissue Perfusion: Goal: Adequacy of tissue perfusion will improve Outcome: Progressing   Problem: Education: Goal: Knowledge of General Education information will improve Description: Including pain rating scale, medication(s)/side effects and non-pharmacologic comfort measures Outcome: Progressing   Problem: Health Behavior/Discharge Planning: Goal: Ability to manage health-related needs will improve Outcome: Progressing   Problem: Clinical Measurements: Goal: Ability to maintain clinical measurements within normal limits will improve Outcome: Progressing Goal: Will remain free from infection Outcome: Progressing Goal: Diagnostic test results will improve Outcome: Progressing Goal: Respiratory complications will improve Outcome: Progressing Goal: Cardiovascular complication will  be avoided Outcome: Progressing   Problem: Activity: Goal: Risk for activity intolerance will decrease Outcome: Progressing   Problem: Nutrition: Goal: Adequate nutrition will be maintained Outcome: Progressing   Problem: Coping: Goal: Level of anxiety will decrease Outcome: Progressing   Problem: Elimination: Goal: Will not experience complications related to bowel motility Outcome: Progressing Goal: Will not experience complications related to urinary retention Outcome: Progressing   Problem: Pain Management: Goal: General experience of comfort will improve Outcome: Progressing   Problem: Safety: Goal: Ability to remain free from injury will improve Outcome: Progressing   Problem: Skin Integrity: Goal: Risk for impaired skin integrity will decrease Outcome: Progressing   Problem: Education: Goal: Knowledge of disease or condition will improve Outcome: Progressing Goal: Knowledge of the prescribed therapeutic regimen will improve Outcome: Progressing Goal: Individualized Educational Video(s) Outcome: Progressing   Problem: Activity: Goal: Ability to tolerate increased activity will improve Outcome: Progressing Goal: Will verbalize the importance of balancing activity with adequate rest periods Outcome: Progressing   Problem: Respiratory: Goal: Ability to maintain a clear airway will improve Outcome: Progressing Goal: Levels of oxygenation will improve Outcome: Progressing Goal: Ability to maintain adequate ventilation will improve Outcome: Progressing   Problem: Activity: Goal: Ability to tolerate increased activity will improve Outcome: Progressing   Problem: Clinical Measurements: Goal: Ability to maintain a body temperature in the normal range will improve Outcome: Progressing   Problem: Respiratory: Goal: Ability to maintain adequate ventilation will improve Outcome: Progressing Goal: Ability to maintain a clear airway will improve Outcome:  Progressing   Problem: Education: Goal: Knowledge of disease or condition will improve Outcome: Progressing Goal: Knowledge of secondary prevention will improve (MUST DOCUMENT ALL) Outcome: Progressing Goal: Knowledge of patient specific risk factors will improve Loraine Leriche N/A or DELETE if not current risk factor) Outcome: Progressing

## 2023-10-27 NOTE — Plan of Care (Signed)
 Problem: Education: Goal: Ability to describe self-care measures that may prevent or decrease complications (Diabetes Survival Skills Education) will improve Outcome: Progressing Goal: Individualized Educational Video(s) Outcome: Progressing   Problem: Coping: Goal: Ability to adjust to condition or change in health will improve Outcome: Progressing   Problem: Fluid Volume: Goal: Ability to maintain a balanced intake and output will improve Outcome: Progressing   Problem: Health Behavior/Discharge Planning: Goal: Ability to identify and utilize available resources and services will improve Outcome: Progressing Goal: Ability to manage health-related needs will improve Outcome: Progressing   Problem: Metabolic: Goal: Ability to maintain appropriate glucose levels will improve Outcome: Progressing   Problem: Nutritional: Goal: Maintenance of adequate nutrition will improve Outcome: Progressing Goal: Progress toward achieving an optimal weight will improve Outcome: Progressing   Problem: Skin Integrity: Goal: Risk for impaired skin integrity will decrease Outcome: Progressing   Problem: Tissue Perfusion: Goal: Adequacy of tissue perfusion will improve Outcome: Progressing   Problem: Education: Goal: Knowledge of General Education information will improve Description: Including pain rating scale, medication(s)/side effects and non-pharmacologic comfort measures Outcome: Progressing   Problem: Health Behavior/Discharge Planning: Goal: Ability to manage health-related needs will improve Outcome: Progressing   Problem: Clinical Measurements: Goal: Ability to maintain clinical measurements within normal limits will improve Outcome: Progressing Goal: Will remain free from infection Outcome: Progressing Goal: Diagnostic test results will improve Outcome: Progressing Goal: Respiratory complications will improve Outcome: Progressing Goal: Cardiovascular complication will  be avoided Outcome: Progressing   Problem: Activity: Goal: Risk for activity intolerance will decrease Outcome: Progressing   Problem: Nutrition: Goal: Adequate nutrition will be maintained Outcome: Progressing   Problem: Coping: Goal: Level of anxiety will decrease Outcome: Progressing   Problem: Elimination: Goal: Will not experience complications related to bowel motility Outcome: Progressing Goal: Will not experience complications related to urinary retention Outcome: Progressing   Problem: Pain Management: Goal: General experience of comfort will improve Outcome: Progressing   Problem: Safety: Goal: Ability to remain free from injury will improve Outcome: Progressing   Problem: Skin Integrity: Goal: Risk for impaired skin integrity will decrease Outcome: Progressing   Problem: Education: Goal: Knowledge of disease or condition will improve Outcome: Progressing Goal: Knowledge of the prescribed therapeutic regimen will improve Outcome: Progressing Goal: Individualized Educational Video(s) Outcome: Progressing   Problem: Activity: Goal: Ability to tolerate increased activity will improve Outcome: Progressing Goal: Will verbalize the importance of balancing activity with adequate rest periods Outcome: Progressing   Problem: Respiratory: Goal: Ability to maintain a clear airway will improve Outcome: Progressing Goal: Levels of oxygenation will improve Outcome: Progressing Goal: Ability to maintain adequate ventilation will improve Outcome: Progressing   Problem: Activity: Goal: Ability to tolerate increased activity will improve Outcome: Progressing   Problem: Clinical Measurements: Goal: Ability to maintain a body temperature in the normal range will improve Outcome: Progressing   Problem: Respiratory: Goal: Ability to maintain adequate ventilation will improve Outcome: Progressing Goal: Ability to maintain a clear airway will improve Outcome:  Progressing   Problem: Education: Goal: Knowledge of disease or condition will improve Outcome: Progressing Goal: Knowledge of secondary prevention will improve (MUST DOCUMENT ALL) Outcome: Progressing Goal: Knowledge of patient specific risk factors will improve Loraine Leriche N/A or DELETE if not current risk factor) Outcome: Progressing   Problem: Ischemic Stroke/TIA Tissue Perfusion: Goal: Complications of ischemic stroke/TIA will be minimized Outcome: Progressing   Problem: Coping: Goal: Will verbalize positive feelings about self Outcome: Progressing Goal: Will identify appropriate support needs Outcome: Progressing   Problem: Health  Behavior/Discharge Planning: Goal: Ability to manage health-related needs will improve Outcome: Progressing Goal: Goals will be collaboratively established with patient/family Outcome: Progressing   Problem: Self-Care: Goal: Ability to participate in self-care as condition permits will improve Outcome: Progressing Goal: Verbalization of feelings and concerns over difficulty with self-care will improve Outcome: Progressing Goal: Ability to communicate needs accurately will improve Outcome: Progressing

## 2023-10-27 NOTE — Plan of Care (Signed)
Problem: Education: Goal: Ability to describe self-care measures that may prevent or decrease complications (Diabetes Survival Skills Education) will improve Outcome: Progressing Goal: Individualized Educational Video(s) Outcome: Progressing   Problem: Coping: Goal: Ability to adjust to condition or change in health will improve Outcome: Progressing   Problem: Fluid Volume: Goal: Ability to maintain a balanced intake and output will improve Outcome: Progressing   Problem: Health Behavior/Discharge Planning: Goal: Ability to identify and utilize available resources and services will improve Outcome: Progressing Goal: Ability to manage health-related needs will improve Outcome: Progressing   Problem: Metabolic: Goal: Ability to maintain appropriate glucose levels will improve Outcome: Progressing   Problem: Nutritional: Goal: Maintenance of adequate nutrition will improve Outcome: Progressing Goal: Progress toward achieving an optimal weight will improve Outcome: Progressing   Problem: Skin Integrity: Goal: Risk for impaired skin integrity will decrease Outcome: Progressing   Problem: Tissue Perfusion: Goal: Adequacy of tissue perfusion will improve Outcome: Progressing   Problem: Education: Goal: Knowledge of General Education information will improve Description: Including pain rating scale, medication(s)/side effects and non-pharmacologic comfort measures Outcome: Progressing   Problem: Health Behavior/Discharge Planning: Goal: Ability to manage health-related needs will improve Outcome: Progressing   Problem: Clinical Measurements: Goal: Ability to maintain clinical measurements within normal limits will improve Outcome: Progressing Goal: Will remain free from infection Outcome: Progressing Goal: Diagnostic test results will improve Outcome: Progressing Goal: Respiratory complications will improve Outcome: Progressing Goal: Cardiovascular complication will  be avoided Outcome: Progressing   Problem: Activity: Goal: Risk for activity intolerance will decrease Outcome: Progressing   Problem: Nutrition: Goal: Adequate nutrition will be maintained Outcome: Progressing   Problem: Coping: Goal: Level of anxiety will decrease Outcome: Progressing   Problem: Elimination: Goal: Will not experience complications related to bowel motility Outcome: Progressing Goal: Will not experience complications related to urinary retention Outcome: Progressing   Problem: Pain Management: Goal: General experience of comfort will improve Outcome: Progressing   Problem: Safety: Goal: Ability to remain free from injury will improve Outcome: Progressing   Problem: Skin Integrity: Goal: Risk for impaired skin integrity will decrease Outcome: Progressing   Problem: Education: Goal: Knowledge of disease or condition will improve Outcome: Progressing Goal: Knowledge of the prescribed therapeutic regimen will improve Outcome: Progressing Goal: Individualized Educational Video(s) Outcome: Progressing   Problem: Activity: Goal: Ability to tolerate increased activity will improve Outcome: Progressing Goal: Will verbalize the importance of balancing activity with adequate rest periods Outcome: Progressing   Problem: Respiratory: Goal: Ability to maintain a clear airway will improve Outcome: Progressing Goal: Levels of oxygenation will improve Outcome: Progressing Goal: Ability to maintain adequate ventilation will improve Outcome: Progressing   Problem: Activity: Goal: Ability to tolerate increased activity will improve Outcome: Progressing   Problem: Clinical Measurements: Goal: Ability to maintain a body temperature in the normal range will improve Outcome: Progressing   Problem: Respiratory: Goal: Ability to maintain adequate ventilation will improve Outcome: Progressing Goal: Ability to maintain a clear airway will improve Outcome:  Progressing   Problem: Education: Goal: Knowledge of disease or condition will improve 10/27/2023 0551 by Clint Lipps, RN Outcome: Progressing 10/27/2023 0551 by Clint Lipps, RN Outcome: Progressing Goal: Knowledge of secondary prevention will improve (MUST DOCUMENT ALL) 10/27/2023 0551 by Clint Lipps, RN Outcome: Progressing 10/27/2023 0551 by Clint Lipps, RN Outcome: Progressing Goal: Knowledge of patient specific risk factors will improve Loraine Leriche N/A or DELETE if not current risk factor) 10/27/2023 0551 by Clint Lipps, RN Outcome: Progressing 10/27/2023 0551 by  Clint Lipps, RN Outcome: Progressing

## 2023-10-28 ENCOUNTER — Inpatient Hospital Stay: Payer: 59

## 2023-10-28 ENCOUNTER — Encounter
Admission: EM | Disposition: A | Discharge status: Skilled Nursing Facility | Payer: Self-pay | Source: Home / Self Care | Attending: Internal Medicine

## 2023-10-28 DIAGNOSIS — G9341 Metabolic encephalopathy: Secondary | ICD-10-CM | POA: Diagnosis not present

## 2023-10-28 DIAGNOSIS — K922 Gastrointestinal hemorrhage, unspecified: Secondary | ICD-10-CM | POA: Diagnosis not present

## 2023-10-28 DIAGNOSIS — K264 Chronic or unspecified duodenal ulcer with hemorrhage: Secondary | ICD-10-CM | POA: Diagnosis not present

## 2023-10-28 DIAGNOSIS — K921 Melena: Secondary | ICD-10-CM

## 2023-10-28 DIAGNOSIS — K269 Duodenal ulcer, unspecified as acute or chronic, without hemorrhage or perforation: Secondary | ICD-10-CM

## 2023-10-28 DIAGNOSIS — R710 Precipitous drop in hematocrit: Secondary | ICD-10-CM

## 2023-10-28 HISTORY — PX: ESOPHAGOGASTRODUODENOSCOPY: SHX5428

## 2023-10-28 LAB — BLOOD GAS, ARTERIAL
Acid-Base Excess: 15.7 mmol/L — ABNORMAL HIGH (ref 0.0–2.0)
Acid-Base Excess: 9.2 mmol/L — ABNORMAL HIGH (ref 0.0–2.0)
Bicarbonate: 41.4 mmol/L — ABNORMAL HIGH (ref 20.0–28.0)
Bicarbonate: 36.4 mmol/L — ABNORMAL HIGH (ref 20.0–28.0)
Delivery systems: POSITIVE
FIO2: 0.35 %
Inspiratory PAP: 10 cm[H2O]
O2 Saturation: 97.9 %
O2 Saturation: 99.9 %
Patient temperature: 37
Patient temperature: 37
Pressure support: 5 cmH2O
pCO2 arterial: 61 mm[Hg] — ABNORMAL HIGH (ref 32–48)
pCO2 arterial: 69 mm[Hg] (ref 32–48)
pH, Arterial: 7.44 (ref 7.35–7.45)
pH, Arterial: 7.33 — ABNORMAL LOW (ref 7.35–7.45)
pO2, Arterial: 104 mm[Hg] (ref 83–108)
pO2, Arterial: 406 mm[Hg] — ABNORMAL HIGH (ref 83–108)

## 2023-10-28 LAB — COMPREHENSIVE METABOLIC PANEL
ALT: 27 U/L (ref 0–44)
AST: 30 U/L (ref 15–41)
Albumin: 2.7 g/dL — ABNORMAL LOW (ref 3.5–5.0)
Alkaline Phosphatase: 71 U/L (ref 38–126)
Anion gap: 11 (ref 5–15)
BUN: 84 mg/dL — ABNORMAL HIGH (ref 8–23)
CO2: 35 mmol/L — ABNORMAL HIGH (ref 22–32)
Calcium: 8.9 mg/dL (ref 8.9–10.3)
Chloride: 83 mmol/L — ABNORMAL LOW (ref 98–111)
Creatinine, Ser: 1.23 mg/dL — ABNORMAL HIGH (ref 0.44–1.00)
GFR, Estimated: 48 mL/min — ABNORMAL LOW (ref >60)
Glucose, Bld: 536 mg/dL (ref 70–99)
Potassium: 5.4 mmol/L — ABNORMAL HIGH (ref 3.5–5.1)
Sodium: 129 mmol/L — ABNORMAL LOW (ref 135–145)
Total Bilirubin: 0.6 mg/dL (ref <1.2)
Total Protein: 5.5 g/dL — ABNORMAL LOW (ref 6.5–8.1)

## 2023-10-28 LAB — AMMONIA: Ammonia: 64 umol/L — ABNORMAL HIGH (ref 9–35)

## 2023-10-28 LAB — CBC WITH DIFFERENTIAL/PLATELET
Abs Immature Granulocytes: 1.07 10*3/uL — ABNORMAL HIGH (ref 0.00–0.07)
Abs Immature Granulocytes: 0.99 10*3/uL — ABNORMAL HIGH (ref 0.00–0.07)
Basophils Absolute: 0 10*3/uL (ref 0.0–0.1)
Basophils Absolute: 0.1 10*3/uL (ref 0.0–0.1)
Basophils Relative: 0 %
Basophils Relative: 0 %
Eosinophils Absolute: 0 10*3/uL (ref 0.0–0.5)
Eosinophils Absolute: 0 10*3/uL (ref 0.0–0.5)
Eosinophils Relative: 0 %
Eosinophils Relative: 0 %
HCT: 19.9 % — ABNORMAL LOW (ref 36.0–46.0)
HCT: 19.1 % — ABNORMAL LOW (ref 36.0–46.0)
Hemoglobin: 6.5 g/dL — ABNORMAL LOW (ref 12.0–15.0)
Hemoglobin: 6.4 g/dL — ABNORMAL LOW (ref 12.0–15.0)
Immature Granulocytes: 6 %
Immature Granulocytes: 5 %
Lymphocytes Relative: 9 %
Lymphocytes Relative: 10 %
Lymphs Abs: 1.5 10*3/uL (ref 0.7–4.0)
Lymphs Abs: 1.9 10*3/uL (ref 0.7–4.0)
MCH: 29.3 pg (ref 26.0–34.0)
MCH: 29.6 pg (ref 26.0–34.0)
MCHC: 32.7 g/dL (ref 30.0–36.0)
MCHC: 33.5 g/dL (ref 30.0–36.0)
MCV: 89.6 fL (ref 80.0–100.0)
MCV: 88.4 fL (ref 80.0–100.0)
Monocytes Absolute: 1 10*3/uL (ref 0.1–1.0)
Monocytes Absolute: 0.8 10*3/uL (ref 0.1–1.0)
Monocytes Relative: 6 %
Monocytes Relative: 4 %
Neutro Abs: 14 10*3/uL — ABNORMAL HIGH (ref 1.7–7.7)
Neutro Abs: 14.9 10*3/uL — ABNORMAL HIGH (ref 1.7–7.7)
Neutrophils Relative %: 79 %
Neutrophils Relative %: 81 %
Platelets: 595 10*3/uL — ABNORMAL HIGH (ref 150–400)
Platelets: 600 10*3/uL — ABNORMAL HIGH (ref 150–400)
RBC: 2.22 MIL/uL — ABNORMAL LOW (ref 3.87–5.11)
RBC: 2.16 MIL/uL — ABNORMAL LOW (ref 3.87–5.11)
RDW: 14.3 % (ref 11.5–15.5)
RDW: 14.2 % (ref 11.5–15.5)
WBC: 17.6 10*3/uL — ABNORMAL HIGH (ref 4.0–10.5)
WBC: 18.6 10*3/uL — ABNORMAL HIGH (ref 4.0–10.5)
nRBC: 1.4 % — ABNORMAL HIGH (ref 0.0–0.2)
nRBC: 1.5 % — ABNORMAL HIGH (ref 0.0–0.2)

## 2023-10-28 LAB — VITAMIN B12: Vitamin B-12: 2331 pg/mL — ABNORMAL HIGH (ref 180–914)

## 2023-10-28 LAB — PREPARE RBC (CROSSMATCH)

## 2023-10-28 LAB — GLUCOSE, CAPILLARY
Glucose-Capillary: 320 mg/dL — ABNORMAL HIGH (ref 70–99)
Glucose-Capillary: 478 mg/dL — ABNORMAL HIGH (ref 70–99)
Glucose-Capillary: 490 mg/dL — ABNORMAL HIGH (ref 70–99)
Glucose-Capillary: 514 mg/dL (ref 70–99)
Glucose-Capillary: 475 mg/dL — ABNORMAL HIGH (ref 70–99)
Glucose-Capillary: 418 mg/dL — ABNORMAL HIGH (ref 70–99)
Glucose-Capillary: 361 mg/dL — ABNORMAL HIGH (ref 70–99)
Glucose-Capillary: 335 mg/dL — ABNORMAL HIGH (ref 70–99)
Glucose-Capillary: 320 mg/dL — ABNORMAL HIGH (ref 70–99)
Glucose-Capillary: 245 mg/dL — ABNORMAL HIGH (ref 70–99)
Glucose-Capillary: 185 mg/dL — ABNORMAL HIGH (ref 70–99)

## 2023-10-28 LAB — LACTIC ACID, PLASMA
Lactic Acid, Venous: 3.2 mmol/L (ref 0.5–1.9)
Lactic Acid, Venous: 5.2 mmol/L (ref 0.5–1.9)
Lactic Acid, Venous: 4.8 mmol/L (ref 0.5–1.9)

## 2023-10-28 LAB — RETICULOCYTES
Immature Retic Fract: 44.1 % — ABNORMAL HIGH (ref 2.3–15.9)
RBC.: 2.08 MIL/uL — ABNORMAL LOW (ref 3.87–5.11)
Retic Count, Absolute: 89.6 10*3/uL (ref 19.0–186.0)
Retic Ct Pct: 4.3 % — ABNORMAL HIGH (ref 0.4–3.1)

## 2023-10-28 LAB — BETA-HYDROXYBUTYRIC ACID
Beta-Hydroxybutyric Acid: 0.39 mmol/L — ABNORMAL HIGH (ref 0.05–0.27)
Beta-Hydroxybutyric Acid: 0.1 mmol/L (ref 0.05–0.27)

## 2023-10-28 LAB — BASIC METABOLIC PANEL
Anion gap: 15 (ref 5–15)
BUN: 77 mg/dL — ABNORMAL HIGH (ref 8–23)
CO2: 28 mmol/L (ref 22–32)
Calcium: 7.8 mg/dL — ABNORMAL LOW (ref 8.9–10.3)
Chloride: 92 mmol/L — ABNORMAL LOW (ref 98–111)
Creatinine, Ser: 1.19 mg/dL — ABNORMAL HIGH (ref 0.44–1.00)
GFR, Estimated: 49 mL/min — ABNORMAL LOW (ref >60)
Glucose, Bld: 329 mg/dL — ABNORMAL HIGH (ref 70–99)
Potassium: 3.3 mmol/L — ABNORMAL LOW (ref 3.5–5.1)
Sodium: 135 mmol/L (ref 135–145)

## 2023-10-28 LAB — FOLATE: Folate: 10.9 ng/mL (ref >5.9)

## 2023-10-28 LAB — ABO/RH: ABO/RH(D): O NEG

## 2023-10-28 LAB — IRON AND TIBC
Iron: 95 ug/dL (ref 28–170)
Saturation Ratios: 39 % — ABNORMAL HIGH (ref 10.4–31.8)
TIBC: 244 ug/dL — ABNORMAL LOW (ref 250–450)
UIBC: 149 ug/dL

## 2023-10-28 LAB — MRSA NEXT GEN BY PCR, NASAL: MRSA by PCR Next Gen: NOT DETECTED

## 2023-10-28 LAB — BRAIN NATRIURETIC PEPTIDE: B Natriuretic Peptide: 36.4 pg/mL (ref 0.0–100.0)

## 2023-10-28 LAB — APTT: aPTT: 26 s (ref 24–36)

## 2023-10-28 LAB — TROPONIN I (HIGH SENSITIVITY): Troponin I (High Sensitivity): 17 ng/L (ref <18)

## 2023-10-28 LAB — PROCALCITONIN: Procalcitonin: 0.16 ng/mL

## 2023-10-28 LAB — PROTIME-INR
INR: 1.2 (ref 0.8–1.2)
Prothrombin Time: 15.2 s (ref 11.4–15.2)

## 2023-10-28 LAB — FERRITIN: Ferritin: 2977 ng/mL — ABNORMAL HIGH (ref 11–307)

## 2023-10-28 SURGERY — EGD (ESOPHAGOGASTRODUODENOSCOPY)
Anesthesia: Monitor Anesthesia Care

## 2023-10-28 SURGERY — ESOPHAGOGASTRODUODENOSCOPY (EGD) WITH PROPOFOL
Anesthesia: General

## 2023-10-28 MED ORDER — FENTANYL BOLUS VIA INFUSION: 25.0000 ug | INTRAVENOUS | Status: DC | PRN

## 2023-10-28 MED ORDER — IPRATROPIUM-ALBUTEROL 0.5-2.5 (3) MG/3ML IN SOLN
3.0000 mL | RESPIRATORY_TRACT | Status: DC
  Administered 2023-10-28 – 2023-10-31 (×16): 3 mL via RESPIRATORY_TRACT
  Filled 2023-10-28 (×15): qty 3

## 2023-10-28 MED ORDER — NOREPINEPHRINE 16 MG/250ML-% IV SOLN
0.0000 ug/min | INTRAVENOUS | Status: DC
  Administered 2023-10-28: 30 ug/min via INTRAVENOUS
  Administered 2023-10-29: 20 ug/min via INTRAVENOUS
  Administered 2023-10-30: 2 ug/min via INTRAVENOUS
  Filled 2023-10-28 (×3): qty 250

## 2023-10-28 MED ORDER — DEXMEDETOMIDINE HCL IN NACL 400 MCG/100ML IV SOLN
INTRAVENOUS | Status: AC
  Filled 2023-10-28: qty 100

## 2023-10-28 MED ORDER — PREDNISONE 20 MG PO TABS: 30.0000 mg | ORAL_TABLET | Freq: Every day | ORAL | Status: DC

## 2023-10-28 MED ORDER — FENTANYL CITRATE PF 50 MCG/ML IJ SOSY: 25.0000 ug | PREFILLED_SYRINGE | Freq: Once | INTRAMUSCULAR | Status: DC

## 2023-10-28 MED ORDER — METHYLPREDNISOLONE SODIUM SUCC 40 MG IJ SOLR
40.0000 mg | INTRAMUSCULAR | Status: DC
  Administered 2023-10-29 – 2023-11-02 (×5): 40 mg via INTRAVENOUS
  Filled 2023-10-28 (×5): qty 1

## 2023-10-28 MED ORDER — SODIUM CHLORIDE 0.9% IV SOLUTION: Freq: Once | INTRAVENOUS | Status: DC

## 2023-10-28 MED ORDER — PANTOPRAZOLE SODIUM 40 MG IV SOLR
40.0000 mg | Freq: Two times a day (BID) | INTRAVENOUS | Status: DC
  Administered 2023-10-29 – 2023-11-02 (×10): 40 mg via INTRAVENOUS
  Filled 2023-10-28 (×10): qty 10

## 2023-10-28 MED ORDER — DEXMEDETOMIDINE HCL IN NACL 400 MCG/100ML IV SOLN
0.0000 ug/kg/h | INTRAVENOUS | Status: DC
  Administered 2023-10-28: 1.2 ug/kg/h via INTRAVENOUS
  Administered 2023-10-29 (×3): 0.8 ug/kg/h via INTRAVENOUS
  Administered 2023-10-30 (×2): 1 ug/kg/h via INTRAVENOUS
  Administered 2023-10-30: 0.9 ug/kg/h via INTRAVENOUS
  Filled 2023-10-28 (×7): qty 100

## 2023-10-28 MED ORDER — MIDAZOLAM HCL 2 MG/2ML IJ SOLN: 2.0000 mg | INTRAMUSCULAR | Status: AC

## 2023-10-28 MED ORDER — ALBUTEROL SULFATE (2.5 MG/3ML) 0.083% IN NEBU
INHALATION_SOLUTION | RESPIRATORY_TRACT | Status: AC
  Administered 2023-10-28: 10 mg via RESPIRATORY_TRACT
  Filled 2023-10-28: qty 12

## 2023-10-28 MED ORDER — SERTRALINE HCL 50 MG PO TABS
50.0000 mg | ORAL_TABLET | Freq: Every day | ORAL | Status: DC
  Administered 2023-10-29 – 2023-10-30 (×2): 50 mg via ORAL
  Filled 2023-10-28 (×2): qty 1

## 2023-10-28 MED ORDER — POTASSIUM CHLORIDE 20 MEQ PO PACK
20.0000 meq | PACK | ORAL | Status: AC
  Administered 2023-10-29: 20 meq
  Filled 2023-10-28: qty 1

## 2023-10-28 MED ORDER — ALBUTEROL SULFATE (2.5 MG/3ML) 0.083% IN NEBU: 10.0000 mg | INHALATION_SOLUTION | Freq: Once | RESPIRATORY_TRACT | Status: AC

## 2023-10-28 MED ORDER — KETAMINE HCL 50 MG/5ML IJ SOSY
PREFILLED_SYRINGE | INTRAMUSCULAR | Status: AC
  Administered 2023-10-28: 100 mg via INTRAVENOUS
  Filled 2023-10-28: qty 10

## 2023-10-28 MED ORDER — INSULIN GLARGINE-YFGN 100 UNIT/ML ~~LOC~~ SOLN
20.0000 [IU] | Freq: Every day | SUBCUTANEOUS | Status: DC
  Filled 2023-10-28: qty 0.2

## 2023-10-28 MED ORDER — NOREPINEPHRINE 4 MG/250ML-% IV SOLN
2.0000 ug/min | INTRAVENOUS | Status: DC
  Administered 2023-10-28: 30 ug/min via INTRAVENOUS
  Administered 2023-10-28: 10 ug/min via INTRAVENOUS
  Filled 2023-10-28 (×2): qty 250

## 2023-10-28 MED ORDER — LORAZEPAM 2 MG/ML IJ SOLN
0.5000 mg | Freq: Once | INTRAMUSCULAR | Status: AC
  Administered 2023-10-28: 0.5 mg via INTRAVENOUS
  Filled 2023-10-28: qty 1

## 2023-10-28 MED ORDER — FAMOTIDINE IN NACL 20-0.9 MG/50ML-% IV SOLN
20.0000 mg | INTRAVENOUS | Status: DC
  Administered 2023-10-28: 20 mg via INTRAVENOUS
  Filled 2023-10-28: qty 50

## 2023-10-28 MED ORDER — MIDAZOLAM HCL 2 MG/2ML IJ SOLN
1.0000 mg | INTRAMUSCULAR | Status: DC | PRN
  Administered 2023-10-28 – 2023-10-31 (×10): 2 mg via INTRAVENOUS
  Filled 2023-10-28 (×13): qty 2

## 2023-10-28 MED ORDER — IOHEXOL 350 MG/ML SOLN
100.0000 mL | Freq: Once | INTRAVENOUS | Status: AC | PRN
  Administered 2023-10-28: 100 mL via INTRAVENOUS

## 2023-10-28 MED ORDER — VANCOMYCIN HCL 10 G IV SOLR
1500.0000 mg | Freq: Once | INTRAVENOUS | Status: AC
  Administered 2023-10-28: 1500 mg via INTRAVENOUS
  Filled 2023-10-28: qty 15

## 2023-10-28 MED ORDER — IPRATROPIUM-ALBUTEROL 0.5-2.5 (3) MG/3ML IN SOLN
3.0000 mL | Freq: Four times a day (QID) | RESPIRATORY_TRACT | Status: DC | PRN
  Administered 2023-11-20: 3 mL via RESPIRATORY_TRACT
  Filled 2023-10-28: qty 3

## 2023-10-28 MED ORDER — NOREPINEPHRINE 4 MG/250ML-% IV SOLN
INTRAVENOUS | Status: AC
  Filled 2023-10-28: qty 250

## 2023-10-28 MED ORDER — INSULIN REGULAR(HUMAN) IN NACL 100-0.9 UT/100ML-% IV SOLN
INTRAVENOUS | Status: DC
  Administered 2023-10-28: 18 [IU]/h via INTRAVENOUS
  Administered 2023-10-28: 14 [IU]/h via INTRAVENOUS
  Filled 2023-10-28 (×2): qty 100

## 2023-10-28 MED ORDER — METHYLPREDNISOLONE SODIUM SUCC 40 MG IJ SOLR
40.0000 mg | INTRAMUSCULAR | Status: AC
  Administered 2023-10-28: 40 mg via INTRAVENOUS
  Filled 2023-10-28: qty 1

## 2023-10-28 MED ORDER — INSULIN ASPART 100 UNIT/ML IJ SOLN: 3.0000 [IU] | Freq: Three times a day (TID) | INTRAMUSCULAR | Status: DC

## 2023-10-28 MED ORDER — MIDAZOLAM HCL 2 MG/2ML IJ SOLN
INTRAMUSCULAR | Status: AC
  Administered 2023-10-28: 2 mg via INTRAVENOUS
  Filled 2023-10-28: qty 2

## 2023-10-28 MED ORDER — DOCUSATE SODIUM 50 MG/5ML PO LIQD
100.0000 mg | Freq: Two times a day (BID) | ORAL | Status: DC
  Administered 2023-10-29 – 2023-11-01 (×7): 100 mg
  Filled 2023-10-28 (×7): qty 10

## 2023-10-28 MED ORDER — SODIUM CHLORIDE 0.9 % IV SOLN
2.0000 g | Freq: Two times a day (BID) | INTRAVENOUS | Status: DC
  Administered 2023-10-29 – 2023-10-31 (×6): 2 g via INTRAVENOUS
  Filled 2023-10-28 (×6): qty 12.5

## 2023-10-28 MED ORDER — POLYETHYLENE GLYCOL 3350 17 G PO PACK
17.0000 g | PACK | Freq: Every day | ORAL | Status: DC
  Filled 2023-10-28: qty 1

## 2023-10-28 MED ORDER — VANCOMYCIN HCL 1.5 G IV SOLR: 1500.0000 mg | INTRAVENOUS | Status: DC

## 2023-10-28 MED ORDER — EPINEPHRINE 1 MG/10ML IJ SOSY
PREFILLED_SYRINGE | INTRAMUSCULAR | Status: AC
  Filled 2023-10-28: qty 10

## 2023-10-28 MED ORDER — CHLORHEXIDINE GLUCONATE CLOTH 2 % EX PADS
6.0000 | MEDICATED_PAD | Freq: Every day | CUTANEOUS | Status: DC
  Administered 2023-10-28 – 2023-11-06 (×10): 6 via TOPICAL

## 2023-10-28 MED ORDER — POTASSIUM CHLORIDE 10 MEQ/100ML IV SOLN
10.0000 meq | INTRAVENOUS | Status: AC
Start: 2023-10-28 — End: 2023-10-29
  Administered 2023-10-28 – 2023-10-29 (×4): 10 meq via INTRAVENOUS
  Filled 2023-10-28 (×4): qty 100

## 2023-10-28 MED ORDER — FUROSEMIDE 10 MG/ML IJ SOLN
20.0000 mg | Freq: Once | INTRAMUSCULAR | Status: AC
  Administered 2023-10-28: 20 mg via INTRAVENOUS
  Filled 2023-10-28: qty 2

## 2023-10-28 MED ORDER — IPRATROPIUM-ALBUTEROL 0.5-2.5 (3) MG/3ML IN SOLN
3.0000 mL | Freq: Four times a day (QID) | RESPIRATORY_TRACT | Status: DC
  Filled 2023-10-28 (×2): qty 3

## 2023-10-28 MED ORDER — KETAMINE HCL 50 MG/5ML IJ SOSY: 1.5000 mg/kg | PREFILLED_SYRINGE | Freq: Once | INTRAMUSCULAR | Status: AC

## 2023-10-28 MED ORDER — EPINEPHRINE 1 MG/10ML IJ SOSY
PREFILLED_SYRINGE | INTRAMUSCULAR | Status: DC | PRN
  Administered 2023-10-28: .3 mg via INTRAVENOUS

## 2023-10-28 MED ORDER — VASOPRESSIN 20 UNITS/100 ML INFUSION FOR SHOCK
0.0000 [IU]/min | INTRAVENOUS | Status: DC
  Administered 2023-10-28 – 2023-10-29 (×3): 0.03 [IU]/min via INTRAVENOUS
  Filled 2023-10-28 (×3): qty 100

## 2023-10-28 MED ORDER — METHYLPREDNISOLONE SODIUM SUCC 40 MG IJ SOLR: 40.0000 mg | Freq: Two times a day (BID) | INTRAMUSCULAR | Status: DC

## 2023-10-28 MED ORDER — ROCURONIUM BROMIDE 10 MG/ML (PF) SYRINGE: 100.0000 mg | PREFILLED_SYRINGE | Freq: Once | INTRAVENOUS | Status: AC

## 2023-10-28 MED ORDER — FENTANYL 2500MCG IN NS 250ML (10MCG/ML) PREMIX INFUSION
25.0000 ug/h | INTRAVENOUS | Status: DC
  Administered 2023-10-29: 25 ug/h via INTRAVENOUS
  Filled 2023-10-28: qty 250

## 2023-10-28 MED ORDER — ROCURONIUM BROMIDE 10 MG/ML (PF) SYRINGE
PREFILLED_SYRINGE | INTRAVENOUS | Status: AC
  Administered 2023-10-28: 100 mg via INTRAVENOUS
  Filled 2023-10-28: qty 10

## 2023-10-28 NOTE — Op Note (Addendum)
[facility] Patient Name: Michele House Procedure Date: 10/28/2023 10:11 PM MRN: [id number] Account #: [id number] Date of Birth: 23-Jun-1956 Admit Type: Inpatient Age: 67 Room: [facility] ENDO ROOM 4 Gender: Female Note Status: Finalized Instrument Name: Bleederscope [id number] Procedure:             Upper GI endoscopy Indications:           Melena, Active gastrointestinal bleeding Providers:             Midge Minium MD, MD Referring MD:          [facility], MD (Referring MD) Medicines:             General Anesthesia Complications:         No immediate complications. Procedure:             Pre-Anesthesia Assessment:                        - Prior to the procedure, a History and Physical was                         performed, and patient medications and allergies were                         reviewed. The patient's tolerance of previous                         anesthesia was also reviewed. The risks and benefits                         of the procedure and the sedation options and risks                         were discussed with the patient. All questions were                         answered, and informed consent was obtained. Prior                         Anticoagulants: The patient has taken no anticoagulant                         or antiplatelet agents. ASA Grade Assessment: IV - A                         patient with severe systemic disease that is a                         constant threat to life. After reviewing the risks and                         benefits, the patient was deemed in satisfactory                         condition to undergo the procedure.                        After obtaining informed consent, the endoscope was  passed under direct vision. Throughout the procedure,                         the patient's blood pressure, pulse, and oxygen                         saturations were  monitored continuously. The Endoscope                         was introduced through the mouth, and advanced to the                         second part of duodenum. The upper GI endoscopy was                         accomplished without difficulty. The patient tolerated                         the procedure well. Findings:      The examined esophagus was normal.      Red blood was found in the entire examined stomach.      One non-bleeding cratered duodenal ulcer with no stigmata of bleeding       was found in the duodenal bulb.      One oozing duodenal ulcer with a visible vessel was found in the second       portion of the duodenum. Area was successfully injected with 3 mL of a       0.1 mg/mL solution of epinephrine for hemostasis. For hemostasis, one       hemostatic clip was successfully placed (MR conditional). Clip       manufacturer: [employer]. There was no bleeding at the end of the       procedure. Coagulation for hemostasis using bipolar probe was successful. Impression:            - Normal esophagus.                        - Red blood in the entire stomach.                        - Non-bleeding duodenal ulcer with no stigmata of                         bleeding.                        - Oozing duodenal ulcer with a visible vessel.                         Injected. Clip (MR conditional) was placed. Clip                         manufacturer: [employer]. Treated with bipolar                         cautery.                        - No specimens collected. Recommendation:        - Use a  proton pump inhibitor IV BID.                        - If any further bleeding then the patient should be                         sent to IR or vascular surgery for embolization at the                         site of the clip placed. Procedure Code(s):     --- Professional ---                        [phone], Esophagogastroduodenoscopy, flexible,                         transoral;  with control of bleeding, any method Diagnosis Code(s):     --- Professional ---                        K92.1, Melena (includes Hematochezia)                        K26.9, Duodenal ulcer, unspecified as acute or                         chronic, without hemorrhage or perforation                        K92.2, Gastrointestinal hemorrhage, unspecified CPT copyright 2022 American Medical Association. All rights reserved. The codes documented in this report are preliminary and upon coder review may  be revised to meet current compliance requirements. Midge Minium MD, MD 10/28/2023 10:53:46 PM This report has been signed electronically. Number of Addenda: 0 Note Initiated On: 10/28/2023 10:11 PM Estimated Blood Loss:  Estimated blood loss: none.      [facility]

## 2023-10-28 NOTE — Progress Notes (Signed)
1525-D. Delton See, NP and Dr Larinda Buttery at bedside for pt evaluation. Pt is tachypneic with shallow respirations. Breath sounds diminished bilaterally. Plan for intubation per Dr Larinda Buttery. RT called and to bedside.  1540- Pt intubated with a 7.5 ETT - 21 at the lip, bilateral breath sounds present. OGT to be placed and xrays to be obtained. Plan for central line placement. Pt's daughter called and given update on pt condition by Erasmo Downer, NP.   1544-Albuterol started by RT per Dr Larinda Buttery at bedside.  1715- Time out complete for placement for central line placement.   1725 - Completion of central line placement - right internal jugular triple lumen catheter.   1840- 1L NS bolus infusing per Dr Larinda Buttery at bedside d/t hypotension. Vasopressin to be started. OGT placed to low intermittent suction per verbal orders by Dr Larinda Buttery. OGT with immediate dark brown/black drainage - orders to be placed for GI consult and PPI coverage. Two units PRBCs to be given.  1850- Dr Larinda Buttery at bedside - verbal orders given for emergency release of PRBCs - with first PRBC to be given via bolus infusion. Report given to [place], Charity fundraiser.

## 2023-10-28 NOTE — Progress Notes (Signed)
PT Cancellation Note  Patient Details Name: Michele House MRN: [id number] DOB: June 17, 1956   Cancelled Treatment:    Reason Eval/Treat Not Completed: Medical issues which prohibited therapy (Chart reviewed for attempted treatment session.  Per chart, patient transfer to CCU due to decline in respiratory status.  Will continue to follow and re-attempt as medically appropriate.)   Karishma Unrein H. Manson Passey, PT, DPT, NCS 10/28/23, 1:27 PM [phone]

## 2023-10-28 NOTE — TOC Progression Note (Signed)
Transition of Care [facility]) - Progression Note    Patient Details  Name: Michele House MRN: [id number] Date of Birth: Jun 07, 1956  Transition of Care [facility]) CM/SW Contact  Margarito Liner, LCSW Phone Number: 10/28/2023, 3:40 PM  Clinical Narrative:  Patient not medically stable and has transferred to the ICU. CSW has withdrawn current insurance authorization request.   Expected Discharge Plan: Skilled Nursing Facility Barriers to Discharge: Continued Medical Work up  Expected Discharge Plan and Services                                               Social Determinants of Health (SDOH) Interventions SDOH Screenings   Food Insecurity: Patient Unable To Answer (10/03/2023)  Recent Concern: Food Insecurity - Food Insecurity Present (09/24/2023)  Housing: High Risk (10/03/2023)  Transportation Needs: Patient Unable To Answer (10/03/2023)  Recent Concern: Transportation Needs - Unmet Transportation Needs (09/24/2023)  Utilities: Patient Unable To Answer (10/03/2023)  Tobacco Use: Medium Risk (10/20/2023)    Readmission Risk Interventions     No data to display

## 2023-10-28 NOTE — Progress Notes (Signed)
I was notified about this patient who has a history of COPD chronic respiratory failure on 2 L of oxygen with diabetes and appears to have been in the hospital since November.  The patient was admitted with COPD exacerbation and respiratory failure.  The patient has subsequently been intubated and had been noted to have GI bleeding.  The patient is now on pressors and it was reported that she had coffee-ground material noted from her upper GI tract. The patient is being worked up for malignancy and metabolic encephalopathy. The patient's hemoglobin 9 days ago up until 2 days ago ranged from 9.1-9.7 with a drop today to 6.4. The patient is planned for a CT angiography.  I discussed the need for stabilization of the patient with transfusions as needed prior to attempting any endoscopic procedures.  The CT angiography will also guide [place] to where the possible site of bleeding may be.

## 2023-10-28 NOTE — Plan of Care (Signed)
 Problem: Education: Goal: Ability to describe self-care measures that may prevent or decrease complications (Diabetes Survival Skills Education) will improve Outcome: Progressing Goal: Individualized Educational Video(s) Outcome: Progressing   Problem: Coping: Goal: Ability to adjust to condition or change in health will improve Outcome: Progressing   Problem: Fluid Volume: Goal: Ability to maintain a balanced intake and output will improve Outcome: Progressing   Problem: Health Behavior/Discharge Planning: Goal: Ability to identify and utilize available resources and services will improve Outcome: Progressing Goal: Ability to manage health-related needs will improve Outcome: Progressing   Problem: Metabolic: Goal: Ability to maintain appropriate glucose levels will improve Outcome: Progressing   Problem: Nutritional: Goal: Maintenance of adequate nutrition will improve Outcome: Progressing Goal: Progress toward achieving an optimal weight will improve Outcome: Progressing   Problem: Skin Integrity: Goal: Risk for impaired skin integrity will decrease Outcome: Progressing   Problem: Tissue Perfusion: Goal: Adequacy of tissue perfusion will improve Outcome: Progressing   Problem: Education: Goal: Knowledge of General Education information will improve Description: Including pain rating scale, medication(s)/side effects and non-pharmacologic comfort measures Outcome: Progressing   Problem: Health Behavior/Discharge Planning: Goal: Ability to manage health-related needs will improve Outcome: Progressing   Problem: Clinical Measurements: Goal: Ability to maintain clinical measurements within normal limits will improve Outcome: Progressing Goal: Will remain free from infection Outcome: Progressing Goal: Diagnostic test results will improve Outcome: Progressing Goal: Respiratory complications will improve Outcome: Progressing Goal: Cardiovascular complication will  be avoided Outcome: Progressing   Problem: Activity: Goal: Risk for activity intolerance will decrease Outcome: Progressing   Problem: Nutrition: Goal: Adequate nutrition will be maintained Outcome: Progressing   Problem: Coping: Goal: Level of anxiety will decrease Outcome: Progressing   Problem: Elimination: Goal: Will not experience complications related to bowel motility Outcome: Progressing Goal: Will not experience complications related to urinary retention Outcome: Progressing   Problem: Pain Management: Goal: General experience of comfort will improve Outcome: Progressing   Problem: Safety: Goal: Ability to remain free from injury will improve Outcome: Progressing   Problem: Skin Integrity: Goal: Risk for impaired skin integrity will decrease Outcome: Progressing   Problem: Education: Goal: Knowledge of disease or condition will improve Outcome: Progressing Goal: Knowledge of the prescribed therapeutic regimen will improve Outcome: Progressing Goal: Individualized Educational Video(s) Outcome: Progressing   Problem: Activity: Goal: Ability to tolerate increased activity will improve Outcome: Progressing Goal: Will verbalize the importance of balancing activity with adequate rest periods Outcome: Progressing   Problem: Respiratory: Goal: Ability to maintain a clear airway will improve Outcome: Progressing Goal: Levels of oxygenation will improve Outcome: Progressing Goal: Ability to maintain adequate ventilation will improve Outcome: Progressing   Problem: Activity: Goal: Ability to tolerate increased activity will improve Outcome: Progressing   Problem: Clinical Measurements: Goal: Ability to maintain a body temperature in the normal range will improve Outcome: Progressing   Problem: Respiratory: Goal: Ability to maintain adequate ventilation will improve Outcome: Progressing Goal: Ability to maintain a clear airway will improve Outcome:  Progressing   Problem: Education: Goal: Knowledge of disease or condition will improve Outcome: Progressing Goal: Knowledge of secondary prevention will improve (MUST DOCUMENT ALL) Outcome: Progressing Goal: Knowledge of patient specific risk factors will improve Loraine Leriche N/A or DELETE if not current risk factor) Outcome: Progressing   Problem: Ischemic Stroke/TIA Tissue Perfusion: Goal: Complications of ischemic stroke/TIA will be minimized Outcome: Progressing   Problem: Coping: Goal: Will verbalize positive feelings about self Outcome: Progressing Goal: Will identify appropriate support needs Outcome: Progressing   Problem: Health  Behavior/Discharge Planning: Goal: Ability to manage health-related needs will improve Outcome: Progressing Goal: Goals will be collaboratively established with patient/family Outcome: Progressing   Problem: Self-Care: Goal: Ability to participate in self-care as condition permits will improve Outcome: Progressing Goal: Verbalization of feelings and concerns over difficulty with self-care will improve Outcome: Progressing Goal: Ability to communicate needs accurately will improve Outcome: Progressing

## 2023-10-28 NOTE — Progress Notes (Signed)
Pharmacy Antibiotic Note  Michele House is a 67 y.o. female admitted on 10/02/2023 with sepsis and intra-abdominal infection .  Pharmacy has been consulted for Vancomycin and Cefepime dosing.  Plan: Vancomycin 1500 mg IV Q 48 hrs. Goal AUC 400-550. Expected AUC: 519.8 SCr used: 1.23 Expected Cmin: 10.1  Cefepime 2g IV q12h   Height: 5' (152.4 cm) Weight: 66.5 kg (146 lb 9.7 oz) IBW/kg (Calculated) : 45.5  Temp (24hrs), Avg:97.8 F (36.6 C), Min:97.4 F (36.3 C), Max:98.3 F (36.8 C)  Recent Labs  Lab 10/23/23 0733 10/24/23 0614 10/25/23 0415 10/26/23 0313 10/28/23 1450 10/28/23 1532 10/28/23 1804  WBC 6.1 6.9  --  4.2 17.6* 18.6*  --   CREATININE 0.77 0.77 1.02* 0.85 1.23*  --   --   LATICACIDVEN  --   --   --   --  3.2*  --  5.2*    Estimated Creatinine Clearance: 36.7 mL/min (A) (by C-G formula based on SCr of 1.23 mg/dL (H)).    Allergies  Allergen Reactions   Penicillins Hives   Aspirin Other (See Comments)    Lips numb   Sulfa Antibiotics     Antimicrobials this admission: Cefepime 12/26 >>  Vancomycin 12/26 >>   Dose adjustments this admission:   Microbiology results:   Thank you for allowing pharmacy to be a part of this patient's care.  Clovia Cuff, PharmD, BCPS 10/28/2023 8:07 PM

## 2023-10-28 NOTE — Consult Note (Signed)
PHARMACY CONSULT NOTE - ELECTROLYTES  Pharmacy Consult for Electrolyte Monitoring and Replacement   Recent Labs: Height: 5' (152.4 cm) Weight: 66.5 kg (146 lb 9.7 oz) IBW/kg (Calculated) : 45.5 Estimated Creatinine Clearance: 38 mL/min (A) (by C-G formula based on SCr of 1.19 mg/dL (H)). Potassium (mmol/L)  Date Value  10/28/2023 3.3 (L)  09/30/2013 3.7   Magnesium (mg/dL)  Date Value  16/08/9603 2.1   Calcium (mg/dL)  Date Value  54/07/8118 7.8 (L)   Calcium, Total (mg/dL)  Date Value  14/78/2956 9.2   Albumin (g/dL)  Date Value  21/30/8657 2.7 (L)  09/30/2013 3.7   Phosphorus (mg/dL)  Date Value  84/69/6295 3.6   Sodium (mmol/L)  Date Value  10/28/2023 135  09/30/2013 136    Assessment  Michele House is a 67 y.o. female presenting with shortness of breath. PMH significant for COPD, chronic respiratory failure on 2 L, hypertension, type 2 diabetes, HFpEF. Recent discharge from the hospital 24th November 2024 after hospitalization for COPD exacerbation complicated by respiratory failure. She presented to the hospital again on 10/02/2023 with chest pain, malaise, cough, shortness of breath, abdominal pain and dysuria. Pharmacy has been consulted to monitor and replace electrolytes.  Diet: NPO, intubated MIVF: N/A Pertinent medications: insulin gtt  Goal of Therapy: Electrolytes WNL  Plan:  K 3.3: patient on insulin gtt Replace with KCl per tube q4h x 2 and KCl IV x 4 BMP checks ordered q4h while on insulin continuous infusion Will check all electrolytes with AM labs  Thank you for allowing pharmacy to be a part of this patient's care.  Clovia Cuff, PharmD, BCPS 10/28/2023 9:34 PM

## 2023-10-28 NOTE — Consult Note (Addendum)
NAME:  Michele House, MRN:  [id number], DOB:  1955/12/26, LOS: 26 ADMISSION DATE:  10/02/2023, CONSULTATION DATE: 10/28/2023 REFERRING MD: Dr. Myriam Forehand, CHIEF COMPLAINT: Shortness of Breath   History of Present Illness:  Michele House is a 67 y.o. female with medical history significant of COPD, chronic respiratory failure on 2 L, hypertension, type 2 diabetes, HFpEF, recent discharge from the hospital 24th November 2024 after hospitalization for COPD exacerbation complicated by respiratory failure.  She presented to the hospital again on 10/02/2023 with chest pain, malaise, cough, shortness of breath, abdominal pain and dysuria.   She was admitted to the hospital for COPD exacerbation, acute on chronic hypoxemic respiratory failure, UTI and pneumonia.   She became more confused in the hospital and MRI brain revealed acute stroke.   Pertinent  Medical History  Acid Reflux  COPD Type II Diabetes Mellitus  HLD HTN Thyroid Disease   Significant Hospital Events: Including procedures, antibiotic start and stop dates in addition to other pertinent events   12/01: Vital stable, respiratory viral panel negative, procalcitonin negative, preliminary blood cultures negative.  Urine cultures are ordered as add-on, ammonia levels mildly elevated at 39, strep pneumo negative, CBG elevated at 244, history of enlarging pulmonary nodule with recommendations for PET/CT during most recent admission which has not been done yet, if video bronchoscopy was scheduled for 10/13/2023 as outpatient. 12/02: Unable to take care of herself and son cannot provide the required care as she required 24-hour supervision due to worsening dementia.  PT is recommending SNF. 12/03: Blood pressure started trending up, restarting home Zestoretic.  Urine cultures with strep agalactiae, penicillin allergy noted, will continue with ceftriaxone for now.  Also started on Remeron for concern of worsening depression and unable to  sleep at night  12/05:  Patient has some blurry vision and weakness and pain.  MRI of the brain negative for acute intracranial abnormality. 12/08:  Patient more confused today than yesterday.  Able to follow some simple commands but not as good of a conversation today as yesterday. 12/09:  Patient still confused today.  Does not follow simple commands today. 12/10: Patient more talkative today but was looking up at the ceiling when she was talking with me.  She does not move her extremities to command but does move them on her own.  Physical therapy and Occupational Therapy did not see her move her right arm. 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  Mild associated petechial hemorrhage.  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 12/12: Patient with new stroke, CTA head and neck with no large vessel occlusion.  Patient had a recent echocardiogram which was normal. Concern of paraneoplastic versus hypercoagulability secondary to malignancy.  Patient missed her appointment for bronchoscopy and outpatient appointment for PET scan due to recurrent hospitalizations. 12/13: Patient continued to have waxing and waning mental status, unable to move right upper and lower extremities.  Having some hallucination.  Pulmonary is planning lung biopsy on 12/27 if she remains stable.  Rapidly progressive dementia and now with underlying stroke, question of paraneoplastic encephalitis and hypercoagulability secondary to underlying undiagnosed malignancy. 12/15: Patient with much improved mentation.  Having some flickering of movements on right upper and lower extremity today, left extremities weaker but seems improving.   12/18: Pt underwent bronchoscopy and lung biopsy 12/19: patient  stable , she is being optimized for dc for  rehab.  She's cleared from pulmonary for discharge. Results from bronch will take 1 week.   12/26: Pt transferred to the stepdown unit with acute hypercapnic respiratory requiring Bipap but subsequently required mechanical intubation due to worsening respiratory failure.  Insulin gtt initiated due to severe hyperglycemia    Interim History / Subjective:  Pt sedated and mechanically intubated requiring levophed gtt @30  mcg/min to maintain map 65 or higher. Followed commands on the left prior to intubation   Objective   Blood pressure (!) 113/59, pulse (!) 134, temperature (!) 97.4 F (36.3 C), temperature source Axillary, resp. rate (!) 21, height 5' 0.95" (1.548 m), weight 74.1 kg, last menstrual period 12/24/1998, SpO2 100%.    FiO2 (%):  [35 %-40 %] 35 % Pressure Support:  [10 cmH20] 10 cmH20   Intake/Output Summary (Last 24 hours) at 10/28/2023 1447 Last data filed at 10/27/2023 2041 Gross per 24 hour  Intake --  Output 1100 ml  Net -1100 ml   Filed Weights   10/05/23 0912 10/20/23 0732  Weight: 74.1 kg 74.1 kg   Examination: General: Acute on chronically-ill appearing female, NAD mechanically intubated  HENT: Supple, no JVD  Lungs: Severely diminished throughout, even, non labored  Cardiovascular: Sinus tachycardia, s1s2, no r/g, 2+ radial/1+ distal pulses, no edema  Abdomen: Hypoactive BS x4, obese, soft, non distended  Extremities: Right-sided hemiplegia  Neuro: Sedated, not following commands, PERRL GU: Indwelling foley catheter draining yellow urine  Resolved Hospital Problem list     Assessment & Plan:   #Acute metabolic encephalopathy #Acute CVA with right-sided hemiplegia: MRI Brain 10/12/22 showed bilateral frontoparietal infarcts almost in a watershed pattern  #Mechanical ventilation discomfort/pain  Hx: Anxiety and panic attacks  - Avoid sedating medications as able  - Maintain RASS goal 0 to -1 - PAD protocol to maintain RASS goal: fentanyl gtt and prn  versed  - WUA daily  - Maintain sleep/wake cycle  - Ammonia level pending   #Acute on chronic hypercapnic respiratory failure  #AECOPD  #Metapneumovirus #Pneumonia #Lung nodule s/p bronchoscopy 10/20/23 #Mechanical ventilation  - Full vent support for now: vent settings reviewed and established  - Continue lung protective strategies  - Maintain plateau pressures less than 30 cm H2O - SBT once all parameters met  - VAP bundle implemented  - Intermittent CXR and ABG's - Scheduled and prn bronchodilator therapy  - IV steroids wean as able  - RUL lung biopsy results 12/18: revealed very rare atypical cells, favor reactive, fragments of alveolated lung and blood clot   #Hypotension multifactorial: sedating medications and anemia  #Acute on chronic diastolic CHF  Hx: HTN and HLD  Echo 09/24/2023: EF 55 to 60%; trivial tricuspid valve regurgitation  - Continuous telemetry monitoring  - CTA Chest PE W or WO pending to rule out pulmonary embolism - Hold outpatient antihypertensives and diuretics for now - Blood products as needed and prn levophed gtt to maintain map 65 or higher   #Acute kidney injury secondary ATN  #Hyponatremia  #Hypochloridemia  #Hyperkalemia  #Lactic acidosis  - Trend BMP and lactic acid  - Replace electrolytes as indicated  - Strict intake and output  - Avoid nephrotoxic medications   #Metapneumovirus  #Strep agalactiae UTI: completed course of iv ceftriaxone  #Pneumonia: completed 5 day course of iv ceftriaxone and azithromycin  - Trend WBC and monitor fever curve - Trend PCT  - Follow cultures  - Will start empiric vancomycin  and zosyn pending culture results and sensitivities   #Anemia without obvious signs of bleeding  #Thrombocytosis  - Trend CBC  - Anemia panel pending  - Will check coags  - CTA GI bleed pending  - Gastroenterology consulted appreciate input  - Monitor for s/sx of bleeding and transfuse for hgb <7  - SCD's for VTE px; will  hold chemical VTE px for now  #Hypothyroidism - Continue synthroid   #Type II diabetes mellitus  - CBG's per glucose stabilizer  - Insulin gtt - BMP q4hrs and beta-hydroxy q8hrs while on insulin gtt  - Diabetes coordinator consulted appreciate input   Best Practice (right click and "Reselect all SmartList Selections" daily)   Diet/type: NPO DVT prophylaxis SCD Pressure ulcer(s): N/A GI prophylaxis: H2B Lines: Central line Foley:  Yes, and it is still needed Code Status:  full code Last date of multidisciplinary goals of care discussion [N/A]  12/26: Updated pts daughter regarding decline in pts condition and current plan of care.  Also discussed code status at this time pt to remain FULL CODE until she is able to discuss this with her brother.  Pt is HIGH RISK for Cardiac Arrest and Sudden Death  Labs   CBC: Recent Labs  Lab 10/23/23 0733 10/24/23 0614 10/26/23 0313  WBC 6.1 6.9 4.2  NEUTROABS  --  6.1 3.8  HGB 9.7* 9.6* 9.1*  HCT 29.9* 29.3* 27.6*  MCV 89.5 88.8 87.1  PLT 209 203 200    Basic Metabolic Panel: Recent Labs  Lab 10/23/23 0733 10/24/23 0614 10/25/23 0415 10/26/23 0313  NA 134* 132* 135 133*  K 4.1 4.1 4.0 4.4  CL 88* 87* 85* 85*  CO2 36* 34* 34* 36*  GLUCOSE 198* 235* 168* 222*  BUN 19 23 27* 31*  CREATININE 0.77 0.77 1.02* 0.85  CALCIUM 8.9 8.9 8.9 8.9  MG  --  2.0 2.1 2.1  PHOS  --  3.6  --   --    GFR: Estimated Creatinine Clearance: 57.5 mL/min (by C-G formula based on SCr of 0.85 mg/dL). Recent Labs  Lab 10/23/23 0733 10/24/23 0614 10/26/23 0313  WBC 6.1 6.9 4.2    Liver Function Tests: Recent Labs  Lab 10/24/23 0614  AST 22  ALT 20  ALKPHOS 72  BILITOT 0.7  PROT 6.0*  ALBUMIN 2.8*   No results for input(s): "LIPASE", "AMYLASE" in the last 168 hours. No results for input(s): "AMMONIA" in the last 168 hours.  ABG    Component Value Date/Time   PHART 7.44 10/28/2023 1311   PCO2ART 61 (H) 10/28/2023 1311   PO2ART 104  10/28/2023 1311   HCO3 41.4 (H) 10/28/2023 1311   O2SAT 97.9 10/28/2023 1311     Coagulation Profile: No results for input(s): "INR", "PROTIME" in the last 168 hours.  Cardiac Enzymes: No results for input(s): "CKTOTAL", "CKMB", "CKMBINDEX", "TROPONINI" in the last 168 hours.  HbA1C: Hgb A1c MFr Bld  Date/Time Value Ref Range Status  10/15/2023 04:48 AM 8.4 (H) 4.8 - 5.6 % Final    Comment:    (NOTE) Pre diabetes:          5.7%-6.4%  Diabetes:              >6.4%  Glycemic control for   <7.0% adults with diabetes   06/07/2023 02:19 PM 7.1 (H) 4.8 - 5.6 % Final    Comment:    (NOTE) Pre diabetes:          5.7%-6.4%  Diabetes:              >6.4%  Glycemic control for   <7.0% adults with diabetes     CBG: Recent Labs  Lab 10/27/23 1619 10/27/23 2034 10/28/23 0755 10/28/23 1247 10/28/23 1432  GLUCAP 377* 318* 320* 478* 514*    Review of Systems:   Unable to assess pt mechanically intubated  Past Medical History:  She,  has a past medical history of Acid reflux, COPD (chronic obstructive pulmonary disease) (HCC), Diabetes mellitus without complication (HCC), Hyperlipidemia, Hypertension, and Thyroid disease.   Surgical History:   Past Surgical History:  Procedure Laterality Date   appendectomy     APPENDECTOMY     BREAST BIOPSY Right    CORE W/CLIP - NEG   TOTAL VAGINAL HYSTERECTOMY     TUMOR REMOVAL     benign;stomach   VIDEO BRONCHOSCOPY WITH ENDOBRONCHIAL NAVIGATION Right 10/20/2023   Procedure: VIDEO BRONCHOSCOPY WITH ENDOBRONCHIAL NAVIGATION;  Surgeon: Vida Rigger, MD;  Location: [facility] ORS;  Service: Thoracic;  Laterality: Right;     Social History:   reports that she quit smoking about 37 years ago. Her smoking use included cigarettes. She has never used smokeless tobacco. She reports that she does not drink alcohol and does not use drugs.   Family History:  Her family history includes Breast cancer in her maternal aunt and maternal  grandmother; Breast cancer (age of onset: 78) in her sister; Heart attack in her mother; Hypertension in her mother.   Allergies Allergies  Allergen Reactions   Penicillins Hives   Aspirin Other (See Comments)    Lips numb   Sulfa Antibiotics      Home Medications  Prior to Admission medications   Medication Sig Start Date End Date Taking? Authorizing Provider  aspirin EC 81 MG tablet Take 81 mg by mouth daily.   Yes [provider]  atorvastatin (LIPITOR) 20 MG tablet Take 20 mg by mouth daily. 06/25/23  Yes [provider]  cetirizine (ZYRTEC) 10 MG tablet Take 10 mg by mouth daily.   Yes [provider]  COMBIVENT RESPIMAT 20-100 MCG/ACT AERS respimat Inhale into the lungs every 6 (six) hours as needed for wheezing or shortness of breath. 09/06/23  Yes [provider]  dextromethorphan-guaiFENesin (MUCINEX DM) 30-600 MG 12hr tablet Take 1 tablet by mouth 2 (two) times daily as needed for cough. 06/08/23  Yes Arnetha Courser, MD  diclofenac sodium (VOLTAREN) 1 % GEL Apply topically 4 (four) times daily.   Yes [provider]  DULERA 200-5 MCG/ACT AERO Inhale 2 puffs into the lungs every 12 (twelve) hours. 09/06/23  Yes [provider]  furosemide (LASIX) 20 MG tablet Take 1 tablet (20 mg total) by mouth daily. 09/27/23  Yes Hall, Carole N, DO  glipiZIDE (GLUCOTROL) 10 MG tablet Take 10 mg by mouth daily before breakfast.   Yes [provider]  ipratropium (ATROVENT HFA) 17 MCG/ACT inhaler Inhale 2 puffs into the lungs every 6 (six) hours.   Yes [provider]  levothyroxine (SYNTHROID, LEVOTHROID) 100 MCG tablet Take 100 mcg by mouth daily before breakfast.   Yes [provider]  lisinopril-hydrochlorothiazide (PRINZIDE,ZESTORETIC) 20-25 MG per tablet Take 1 tablet by mouth daily.   Yes [provider]  Multiple Vitamin (MULTIVITAMIN) capsule Take 1 capsule by mouth daily.   Yes [provider]   predniSONE (STERAPRED UNI-PAK 21 TAB) 10 MG (21) TBPK tablet As directed on packaging 09/23/23  Yes Bradler, Clent Jacks,  MD  pregabalin (LYRICA) 50 MG capsule Take 50 mg by mouth 2 (two) times daily.   Yes [provider]  sertraline (ZOLOFT) 100 MG tablet Take 100 mg by mouth daily. 06/28/23  Yes [provider]  TRADJENTA 5 MG TABS tablet Take 5 mg by mouth daily. 07/26/23  Yes [provider]     Critical care time: 80 minutes       Zada Girt, AGNP  Pulmonary/Critical Care Pager [phone] (please enter 7 digits) PCCM Consult Pager [phone] (please enter 7 digits)

## 2023-10-28 NOTE — Progress Notes (Signed)
PULMONOLOGY         Date: 10/28/2023,   MRN# [id number] Michele House 1956-05-27     AdmissionWeight: 74.1 kg                 CurrentWeight: 74.1 kg  Referring provider: Dr Nelson Chimes   CHIEF COMPLAINT:   Lung nodule suspicious for carcinoma   HISTORY OF PRESENT ILLNESS   Patient with COPD, dyslipidemia, HTN, thyroid dysfunction, GERD, She has 1.5cm RUL nodule suspicious for primary bronchogenic carcinoma. She needs to have tissue diagnosis. PCCM consultation for biopsy.   10/15/23- patient encepalopathic on examination.  Have ordered ABG, Uric acid, ammonia levels, renal function is normal, electrolytes normal , sugars improved <200.  Neuro and oncology following, s/p LP, s/p paraneoplastic panel, s/p autoimmune panel.  Planning for lung biopsy on 10/29/23 if patient is more stable and safe candidate for general anesthesia  10/17/23- patient awake and alert, she is able to answer questions appropriately.  Encephalopathy has improved markedly. We discussed lung biopsy she has consented to procedure and wishes to proceed as planned.  10/18/23- patient reports leg pain.  She wants to get OOB to walk with PT.  She seems a bit sleepy during interview.  I have dcd claritin.  10/19/23- patient is improved sitting up in bed eating.  She is alert and lucid.  Mild edema on examination. Have ordered CBC and CMP today.  Will diurese today due to 1+ edema on examination.  NPO today at midnight.  Lung biopsy in AM at 730AM 10/20/23- patient evaluated this am , she reports feeling well. She is on 3L/min [place].  She had presurgical evaluation by anesthesia, medical service and neurology yesterday as well as myself.  Group discussion via secure chat with agreement that benefit of this lung biopsy with goal to cure cancer outweighs the risks.  Decision made to proceed as planned by all providers and patient.   10/21/23- patient stable , she is being optimized for dc for rehab.  She's cleared from  pulmonary for discharge. Results from bronch will take 1 week.  10/22/23- patient seen at bedside , she's on 2-3L/min [place].  She reports anxiety due to not having her pillows fluffed and she did not receive warmed blanket that she requested.  She reports being jittery tonight but denies pain anywhere.  She has been in bed but states she had PT today.  10/23/23- patient seen at bedside, is now weaned off oxygen completely.  She seems clinically close to baseline.   10/24/23- patient seen at bedside , on room air.  Main complaint today is constipation.  She had tap water enema at 5pm yesterday. She requests help with BM, I ve discussed with RN Vernona Rieger and we will deliver suppository and miralax.  10/25/23- patient seen at bedside.  Patient is on room air. Reports improvement in abd discomfort.  10/28/23- patient is seen at bedside she is on 3-4L/min [place] vitals are stable. Reduced prednisone to 30mg     PAST MEDICAL HISTORY   Past Medical History:  Diagnosis Date   Acid reflux    COPD (chronic obstructive pulmonary disease) (HCC)    Diabetes mellitus without complication (HCC)    Hyperlipidemia    Hypertension    Thyroid disease      SURGICAL HISTORY   Past Surgical History:  Procedure Laterality Date   appendectomy     APPENDECTOMY     BREAST BIOPSY Right    CORE W/CLIP - NEG  TOTAL VAGINAL HYSTERECTOMY     TUMOR REMOVAL     benign;stomach   VIDEO BRONCHOSCOPY WITH ENDOBRONCHIAL NAVIGATION Right 10/20/2023   Procedure: VIDEO BRONCHOSCOPY WITH ENDOBRONCHIAL NAVIGATION;  Surgeon: Vida Rigger, MD;  Location: [facility] ORS;  Service: Thoracic;  Laterality: Right;     FAMILY HISTORY   Family History  Problem Relation Age of Onset   Heart attack Mother    Hypertension Mother    Breast cancer Sister 70   Breast cancer Maternal Aunt        40'S   Breast cancer Maternal Grandmother      SOCIAL HISTORY   Social History   Tobacco Use   Smoking status: Former    Current  packs/day: 0.00    Types: Cigarettes    Quit date: 12/15/1985    Years since quitting: 37.8   Smokeless tobacco: Never  Substance Use Topics   Alcohol use: No   Drug use: No     MEDICATIONS    Home Medication:    Current Medication:  Current Facility-Administered Medications:    acetaminophen (TYLENOL) tablet 650 mg, 650 mg, Oral, Q6H PRN, Floydene Flock, MD, 650 mg at 10/25/23 0038   bisacodyl (DULCOLAX) suppository 10 mg, 10 mg, Rectal, Daily PRN, Marolyn Haller, MD, 10 mg at 10/25/23 1501   clopidogrel (PLAVIX) tablet 75 mg, 75 mg, Oral, Daily, Milon Dikes, MD, 75 mg at 10/28/23 0817   dextromethorphan-guaiFENesin (MUCINEX DM) 30-600 MG per 12 hr tablet 1 tablet, 1 tablet, Oral, BID PRN, Floydene Flock, MD, 1 tablet at 10/27/23 2109   enoxaparin (LOVENOX) injection 40 mg, 40 mg, Subcutaneous, Daily, Renae Gloss, Richard, MD, 40 mg at 10/28/23 0818   furosemide (LASIX) tablet 20 mg, 20 mg, Oral, Daily, Lurene Shadow, MD, 20 mg at 10/28/23 0817   hydrOXYzine (ATARAX) tablet 25 mg, 25 mg, Oral, TID PRN, Lurene Shadow, MD, 25 mg at 10/28/23 0817   insulin aspart (novoLOG) injection 0-15 Units, 0-15 Units, Subcutaneous, TID WC, Alford Highland, MD, 11 Units at 10/28/23 0817   insulin aspart (novoLOG) injection 0-5 Units, 0-5 Units, Subcutaneous, QHS, Alford Highland, MD, 4 Units at 10/27/23 2108   insulin glargine-yfgn (SEMGLEE) injection 18 Units, 18 Units, Subcutaneous, QHS, Marolyn Haller, MD, 18 Units at 10/27/23 2107   ipratropium-albuterol (DUONEB) 0.5-2.5 (3) MG/3ML nebulizer solution 3 mL, 3 mL, Nebulization, Q4H PRN, Lurene Shadow, MD   levothyroxine (SYNTHROID) tablet 100 mcg, 100 mcg, Oral, Q0600, Floydene Flock, MD, 100 mcg at 10/28/23 0546   menthol-cetylpyridinium (CEPACOL) lozenge 3 mg, 1 lozenge, Oral, PRN, Vida Rigger, MD, 3 mg at 10/22/23 0604   methocarbamol (ROBAXIN) tablet 500 mg, 500 mg, Oral, Q8H PRN, Arnetha Courser, MD, 500 mg at 10/28/23 0817    ondansetron (ZOFRAN) injection 4 mg, 4 mg, Intravenous, Q6H PRN, Lurene Shadow, MD, 4 mg at 10/24/23 2119   Oral care mouth rinse, 15 mL, Mouth Rinse, PRN, Wieting, Richard, MD   predniSONE (DELTASONE) tablet 40 mg, 40 mg, Oral, Q breakfast, Marolyn Haller, MD, 40 mg at 10/28/23 0818   QUEtiapine (SEROQUEL) tablet 12.5 mg, 12.5 mg, Oral, QHS PRN, Alford Highland, MD, 12.5 mg at 10/27/23 2107   ramelteon (ROZEREM) tablet 8 mg, 8 mg, Oral, QHS, Marolyn Haller, MD, 8 mg at 10/27/23 2107   rOPINIRole (REQUIP XL) 24 hr tablet 4 mg, 4 mg, Oral, QHS, Amin, Tilman Neat, MD, 4 mg at 10/27/23 2334   senna-docusate (Senokot-S) tablet 1 tablet, 1 tablet, Oral, Daily, Marolyn Haller, MD, 1 tablet at  10/28/23 0818   sertraline (ZOLOFT) tablet 25 mg, 25 mg, Oral, Daily, Wieting, Richard, MD, 25 mg at 10/28/23 0818   sodium chloride (OCEAN) 0.65 % nasal spray 1 spray, 1 spray, Each Nare, PRN, Marolyn Haller, MD   traMADol Janean Sark) tablet 50 mg, 50 mg, Oral, Q6H PRN, Arnetha Courser, MD, 50 mg at 10/27/23 2021    ALLERGIES   Penicillins, Aspirin, and Sulfa antibiotics     REVIEW OF SYSTEMS    Review of Systems:  Gen:  Denies  fever, sweats, chills weigh loss  HEENT: Denies blurred vision, double vision, ear pain, eye pain, hearing loss, nose bleeds, sore throat Cardiac:  No dizziness, chest pain or heaviness, chest tightness,edema Resp:   reports dyspnea chronically  Gi: Denies swallowing difficulty, stomach pain, nausea or vomiting, diarrhea, constipation, bowel incontinence Gu:  Denies bladder incontinence, burning urine Ext:   Denies Joint pain, stiffness or swelling Skin: Denies  skin rash, easy bruising or bleeding or hives Endoc:  Denies polyuria, polydipsia , polyphagia or weight change Psych:   Denies depression, insomnia or hallucinations   Other:  All other systems negative   VS: BP 126/85 (BP Location: Right Arm)   Pulse (!) 107   Temp 97.6 F (36.4 C) (Oral)   Resp 12    Ht 5' 0.95" (1.548 m)   Wt 74.1 kg   LMP 12/24/1998 Comment: prior to her hysterectomy  SpO2 99%   BMI 30.92 kg/m      PHYSICAL EXAM    GENERAL:NAD, no fevers, chills, no weakness no fatigue HEAD: Normocephalic, atraumatic.  EYES: Pupils equal, round, reactive to light. Extraocular muscles intact. No scleral icterus.  MOUTH: Moist mucosal membrane. Dentition intact. No abscess noted.  EAR, NOSE, THROAT: Clear without exudates. No external lesions.  NECK: Supple. No thyromegaly. No nodules. No JVD.  PULMONARY: decreased breath sounds with mild rhonchi worse at bases bilaterally.  CARDIOVASCULAR: S1 and S2. Regular rate and rhythm. No murmurs, rubs, or gallops. No edema. Pedal pulses 2+ bilaterally.  GASTROINTESTINAL: Soft, nontender, nondistended. No masses. Positive bowel sounds. No hepatosplenomegaly.  MUSCULOSKELETAL: No swelling, clubbing, or edema. Range of motion full in all extremities.  NEUROLOGIC: Cranial nerves II through XII are intact. No gross focal neurological deficits. Appears tired with excessive sleepiness  SKIN: No ulceration, lesions, rashes, or cyanosis. Skin warm and dry. Turgor intact.  PSYCHIATRIC: Mood, affect within normal limits. The patient is awake, alert and oriented x 3. Insight, judgment intact.       IMAGING   CT chest -  Enlarging spiculated nodule in the inferior right upper lobe now 1.6 cm compared to 1.3 cm previously.   ASSESSMENT/PLAN   Lung nodule suspicious for primary bronchogenic carcinoma   1.5cm - RUL   -patient s/p bronchoscopy - results- showing rare atypical cells from surgical pathology more consistent with reactive process. Lymph node negative for cancer  -s/p bronchoscopy patient is stable     Encephalopathy- RESOLVED  S/p neuro and med/onc eval  - ordered ammonia, uric acid, ABG today -reviewed    COPD with bronchitic phenotype   - patient reports tightness in chest today   - she reports panic attack when resp  status deteriorates.  She is on 3L/min [place]2 at this time.    -She uses albuterol and Breo at home   Thank you for allowing me to participate in the care of this patient.   Patient/Family are satisfied with care plan and all questions have been answered.  Provider disclosure: Patient with at least one acute or chronic illness or injury that poses a threat to life or bodily function and is being managed actively during this encounter.  All of the below services have been performed independently by signing provider:  review of prior documentation from internal and or external health records.  Review of previous and current lab results.  Interview and comprehensive assessment during patient visit today. Review of current and previous chest radiographs/CT scans. Discussion of management and test interpretation with health care team and patient/family.   This document was prepared using Dragon voice recognition software and may include unintentional dictation errors.     Vida Rigger, M.D.  Division of Pulmonary & Critical Care Medicine

## 2023-10-28 NOTE — Progress Notes (Signed)
Transported to CT and back with no events.

## 2023-10-28 NOTE — Procedures (Signed)
Endotracheal Intubation: Patient required placement of an artificial airway secondary to acute hypercapnic respiratory failure and acute metabolic encephalopathy    Consent: Emergent.    Hand washing performed prior to starting the procedure.    Medications administered for sedation prior to procedure:  Versed 2 mg IV, Ketamine 100 mg IV, Rocuronium 100 mg IV     A time out procedure was called and correct patient, name, & ID confirmed. Needed supplies and equipment were assembled and checked to include ETT, 10 ml syringe, Glidescope, Mac and Miller blades, suction, oxygen and bag mask valve, end tidal CO2 monitor.    Patient was positioned to align the mouth and pharynx to facilitate visualization of the glottis.    Heart rate, SpO2 and blood pressure was continuously monitored during the procedure. Pre-oxygenation was conducted prior to intubation and endotracheal tube was placed through the vocal cords into the trachea.       The artificial airway was placed under direct visualization via glidescope route using a 7.5 cm ETT on the first attempt.   ETT was secured at 21 cm.   Placement was confirmed by auscuitation of lungs with good breath sounds bilaterally and no stomach sounds.  Condensation was noted on endotracheal tube.   Pulse ox 99%  CO2 detector in place with appropriate color change.    Complications: None .        Chest radiograph ordered and pending.   Zada Girt, AGNP  Pulmonary/Critical Care Pager [phone] (please enter 7 digits) PCCM Consult Pager [phone] (please enter 7 digits)

## 2023-10-28 NOTE — Progress Notes (Addendum)
Progress Note    Michele House  BMW:[id number] DOB: 09-11-1956  DOA: 10/02/2023 PCP: [facility]      Brief Narrative:    Medical records reviewed and are as summarized below:  Michele House is a 67 y.o. female with medical history significant of COPD, chronic respiratory failure on 2 L, hypertension, type 2 diabetes, HFpEF, recent discharge from the hospital 24th November 2024 after hospitalization for COPD exacerbation complicated by respiratory failure.  She presented to the hospital again on 10/02/2023 with chest pain, malaise, cough, shortness of breath, abdominal pain and dysuria.  She was admitted to the hospital for COPD exacerbation, acute on chronic hypoxemic respiratory failure, UTI and pneumonia.  She became more confused in the hospital and MRI brain revealed acute stroke.        12/1: Vital stable, respiratory viral panel negative, procalcitonin negative, preliminary blood cultures negative.  Urine cultures are ordered as add-on, ammonia levels mildly elevated at 39, strep pneumo negative, CBG elevated at 244, history of enlarging pulmonary nodule with recommendations for PET/CT during most recent admission which has not been done yet, if video bronchoscopy was scheduled for 10/13/2023 as outpatient.  Unable to take care of himself and son cannot provide the required care as she required 24-hour supervision due to worsening dementia.  PT is recommending SNF.  12/2: Hemodynamically stable, now on baseline oxygen of 2 L.  Needs SNF placement. Urine cultures pending.  12/3: Blood pressure started trending up, restarting home Zestoretic.  Urine cultures with strep agalactiae, penicillin allergy noted, will continue with ceftriaxone for now.  Also started on Remeron for concern of worsening depression and unable to sleep at night  12/4.  Some very concerned about patient's mental status not being seen.  Patient receiving antibiotics already  and should have improved by now.  Will hold Lipitor.  Restart low-dose Zoloft.  Discontinue Remeron and do Seroquel at night. 12/5.  Patient has some blurry vision and weakness and pain.  MRI of the brain negative for acute intracranial abnormality. 12/6.  Patient stated she slept well.  Patient able to lift up her arms up off the bed today.  Patient thought she heard her son's voice outside the room. 12/7.  Patient sitting up in chair when I saw her.  Was able to feed herself breakfast.  Felt okay.  Did not offer any complaints. 12/8.  Patient more confused today than yesterday.  Able to follow some simple commands but not as good of a conversation today as yesterday. 12/9.  Patient still confused today.  Does not follow simple commands today. 12/10.  Patient more talkative today but was looking up at the ceiling when she was talking with me.  She does not move her extremities to command but does move them on her own.  Physical therapy and Occupational Therapy did not see her move her right arm. 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  Mild associated petechial hemorrhage.  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 12/12: Patient with new stroke, CTA head and neck with no large vessel occlusion.  Patient had a recent echocardiogram which was normal. CSF cultures remain negative.  Lipid panel with increased triglyceride at 347, HDL 29 and LDL of 38.  CBC with mild thrombocytopenia at 130, BMP mostly unremarkable.  Patient with significant right-sided weakness. Concern of paraneoplastic versus hypercoagulability secondary to malignancy.  Patient missed her appointment for bronchoscopy and outpatient appointment for PET scan due to recurrent hospitalizations. Involving pulmonary and oncology for their input. Send  out encephalitis panel still pending.  12/13: Patient continued to have waxing and waning mental status, unable to move right upper and lower extremities.  Having some hallucination.  Pulmonary is planning lung biopsy on 12/27 if she remains stable.  Rapidly progressive dementia and now with underlying stroke, question of paraneoplastic encephalitis and hypercoagulability secondary to underlying undiagnosed malignancy. Patient will also get benefit from neuropsych evaluation as outpatient.  12/14: Patient with some improved mentation today.  Trying to squeeze with right hand but still no movements of right lower extremity.  12/15: Patient with much improved mentation.  Having some flickering of movements on right upper and lower extremity today, left extremities weaker but seems improving.   12/16: Mental status seems stable, flaccid right upper and lower extremities.  Complaining of muscle spasms so Robaxin was ordered.  Pulmonary is trying to do bronchoscopy and biopsy on Wednesday.      Assessment/Plan:   Principal Problem:   Acute metabolic encephalopathy Active Problems:   CVA (cerebral vascular accident) (HCC)   Uncontrolled type 2 diabetes mellitus with hypoglycemia, without long-term current use of insulin (HCC)   Hypotension   Acute on chronic respiratory failure with hypoxia (HCC)   Hypernatremia   COPD exacerbation (HCC)   Lung nodule   Pneumonia   UTI (urinary tract infection)   (HFpEF) heart failure with preserved ejection fraction (HCC)   Essential hypertension   Type 2 diabetes mellitus with obesity (HCC)    Body mass index is 30.92 kg/m.   Acute metabolic encephalopathy: Improved. She completed 5 days of high-dose several IV Solu-Medrol on 10/12/2023 Differential diagnosis include autoimmune encephalitis   Acute stroke with right hemiplegia: Continue Plavix.  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   Type II DM with severe hyperglycemia: Glucose up to 514.  Steroids likely contributing to hyperglycemia.  Discontinue Semglee, scheduled NovoLog and.  NovoLog.  Start insulin infusion and monitor glucose level per Endo tool protocol.   Hypotension: Fluctuating BP.  Monitor closely.   Pneumonia: Completed 5 days of IV ceftriaxone and azithromycin Acute UTI: Urine culture showed strep agalactiae.  Completed IV ceftriaxone.   Acute on chronic diastolic CHF: She was given IV Lasix 40 mg x 1 dose this morning Anxiety/panic attacks: She was given IV Ativan 0.5 mg x 1 dose today.  She is already on sertraline (dose increased from 25 to 50 mg daily).   COPD exacerbation: This had previously resolved.  However, she had recurrent COPD exacerbation and was restarted on prednisone on 10/25/2023.  Continue prednisone    Acute on chronic hypoxemic respiratory failure, chronic hypercapnic respiratory failure: She was started on BiPAP on 10/24/2023 for increased work of breathing and worsening respiratory failure. She developed respiratory distress with tachycardia this morning.  She did not improve after IV Ativan and IV Lasix.  She is already on steroids and bronchodilators.  She was placed back on BiPAP. ABG on BiPAP of showed pH 7.44, pCO2 61, pO2 104, bicarb 41.4, O2 sat 97.9% on 35% FiO2. Consulted Dr. Larinda Buttery, intensivist, to assist with management. CTA chest has been ordered for further evaluation  because of tachypnea and sinus tachycardia. CBC, CMP, lactic acid, procalcitonin and blood cultures have been ordered as well   1.3 cm right upper lung spiculated nodule: S/p bronchoscopy on 10/20/2023.   Pathology was negative for malignancy and findings more consistent with reactive bronchial cells with acute inflammation.   Transfer patient from  progressive cardiac unit to stepdown unit.  Patient is okay with intubation and mechanical ventilation if required. Plan of care was discussed with Mr. Allahna Merida, son.  He requested that I speak with Ms. Marin Roberts.  Case was discussed with Ms. Weicht as well.  They understand that patient may have to be intubated and placed on mechanical ventilation if she does not improve.  They are both agreeable with the plan. Case discussed with Metro Kung, RN and unit manager at the bedside   CRITICAL CARE Performed by: Lurene Shadow   Total critical care time: 40 minutes  Critical care time was exclusive of separately billable procedures and treating other patients.  Critical care was necessary to treat or prevent imminent or life-threatening deterioration.  Critical care was time spent personally by me on the following activities: development of treatment plan with patient and/or surrogate as well as nursing, discussions with consultants, evaluation of patient's response to treatment, examination of patient, obtaining history from patient or surrogate, ordering and performing treatments and interventions, ordering and review of laboratory studies, ordering and review of radiographic studies, pulse oximetry and re-evaluation of patient's condition.    Diet Order             DIET DYS 3 Room service appropriate? Yes; Fluid consistency: Thin  Diet effective now                            Consultants: Pulmonologist Hematologist  Procedures: Lumbar puncture on 10/08/2023 Bronchoscopy on 10/20/2023    Medications:    Chlorhexidine Gluconate Cloth  6 each Topical Daily   clopidogrel  75 mg Oral Daily   enoxaparin (LOVENOX) injection  40 mg Subcutaneous Daily   furosemide  20 mg Oral Daily   ipratropium-albuterol  3 mL Nebulization Q6H   levothyroxine  100 mcg Oral Q0600   polyethylene glycol  17 g Oral Daily   [START ON 10/29/2023] predniSONE  30 mg Oral Q breakfast    ramelteon  8 mg Oral QHS   rOPINIRole  4 mg Oral QHS   senna-docusate  1 tablet Oral Daily   [START ON 10/29/2023] sertraline  50 mg Oral Daily   Continuous Infusions:  insulin       Anti-infectives (From admission, onward)    Start     Dose/Rate Route Frequency Ordered Stop   10/04/23 1000  azithromycin (ZITHROMAX) tablet 500 mg        500 mg Oral Daily 10/03/23 0751 10/06/23 0856   10/03/23 0600  cefTRIAXone (ROCEPHIN) 2 g in sodium chloride 0.9 % 100 mL IVPB  Status:  Discontinued        2 g 200 mL/hr over 30 Minutes Intravenous Every 24 hours 10/02/23 0740 10/07/23 0854   10/02/23 0745  cefTRIAXone (ROCEPHIN) 2 g in sodium chloride 0.9 % 100 mL IVPB  Status:  Discontinued        2 g 200 mL/hr over 30 Minutes Intravenous Every 24 hours 10/02/23 0738 10/02/23 0740   10/02/23 0745  azithromycin (ZITHROMAX) 500 mg in sodium chloride 0.9 % 250 mL IVPB  500 mg 250 mL/hr over 60 Minutes Intravenous Every 24 hours 10/02/23 0738 10/03/23 1236   10/02/23 0630  cefTRIAXone (ROCEPHIN) 2 g in sodium chloride 0.9 % 100 mL IVPB        2 g 200 mL/hr over 30 Minutes Intravenous  Once 10/02/23 0617 10/02/23 0714              Family Communication/Anticipated D/C date and plan/Code Status   DVT prophylaxis: enoxaparin (LOVENOX) injection 40 mg Start: 10/12/23 1115 SCDs Start: 10/02/23 3244     Code Status: Full Code  Family Communication: None Disposition Plan: Plan to discharge to SNF    Status is: Inpatient Remains inpatient appropriate because: Awaiting placement to SNF       Subjective:   Interval events complains of.  She complains of shortness of breath and anxiety.  She said that she has panic attacks from time to time and that is usually characterized by difficulty breathing and increasing heart rate.  No chest pain  Objective:    Vitals:   10/28/23 1250 10/28/23 1328 10/28/23 1430 10/28/23 1431  BP: (!) 91/57 114/64  (!) 113/59  Pulse: 63 (!) 134     Resp: (!) 22 18  (!) 21  Temp:  97.8 F (36.6 C)  (!) 97.4 F (36.3 C)  TempSrc:  Axillary  Axillary  SpO2: (!) 85% (!) 86% 100%   Weight:      Height:       No data found.   Intake/Output Summary (Last 24 hours) at 10/28/2023 1444 Last data filed at 10/27/2023 2041 Gross per 24 hour  Intake --  Output 1100 ml  Net -1100 ml    Filed Weights   10/05/23 0912 10/20/23 0732  Weight: 74.1 kg 74.1 kg    Exam:   GEN: Acute respiratory distress SKIN: Warm and dry EYES: No pallor or icterus ENT: MMM CV: RRR, tachycardic PULM: Decreased air entry bilaterally, no wheezing or rales heard. ABD: soft, obese, NT, +BS CNS: AAO x 3, non focal EXT: No edema or tenderness    Data Reviewed:   I have personally reviewed following labs and imaging studies:  Labs: Labs show the following:   Basic Metabolic Panel: Recent Labs  Lab 10/23/23 0733 10/24/23 0614 10/25/23 0415 10/26/23 0313  NA 134* 132* 135 133*  K 4.1 4.1 4.0 4.4  CL 88* 87* 85* 85*  CO2 36* 34* 34* 36*  GLUCOSE 198* 235* 168* 222*  BUN 19 23 27* 31*  CREATININE 0.77 0.77 1.02* 0.85  CALCIUM 8.9 8.9 8.9 8.9  MG  --  2.0 2.1 2.1  PHOS  --  3.6  --   --    GFR Estimated Creatinine Clearance: 57.5 mL/min (by C-G formula based on SCr of 0.85 mg/dL). Liver Function Tests: Recent Labs  Lab 10/24/23 0614  AST 22  ALT 20  ALKPHOS 72  BILITOT 0.7  PROT 6.0*  ALBUMIN 2.8*   No results for input(s): "LIPASE", "AMYLASE" in the last 168 hours. No results for input(s): "AMMONIA" in the last 168 hours.  Coagulation profile No results for input(s): "INR", "PROTIME" in the last 168 hours.  CBC: Recent Labs  Lab 10/23/23 0733 10/24/23 0614 10/26/23 0313  WBC 6.1 6.9 4.2  NEUTROABS  --  6.1 3.8  HGB 9.7* 9.6* 9.1*  HCT 29.9* 29.3* 27.6*  MCV 89.5 88.8 87.1  PLT 209 203 200   Cardiac Enzymes: No results for input(s): "CKTOTAL", "CKMB", "CKMBINDEX", "TROPONINI" in the  last 168 hours. BNP (last 3  results) No results for input(s): "PROBNP" in the last 8760 hours. CBG: Recent Labs  Lab 10/27/23 1619 10/27/23 2034 10/28/23 0755 10/28/23 1247 10/28/23 1432  GLUCAP 377* 318* 320* 478* 514*   D-Dimer: No results for input(s): "DDIMER" in the last 72 hours. Hgb A1c: No results for input(s): "HGBA1C" in the last 72 hours. Lipid Profile: No results for input(s): "CHOL", "HDL", "LDLCALC", "TRIG", "CHOLHDL", "LDLDIRECT" in the last 72 hours. Thyroid function studies: No results for input(s): "TSH", "T4TOTAL", "T3FREE", "THYROIDAB" in the last 72 hours.  Invalid input(s): "FREET3" Anemia work up: No results for input(s): "VITAMINB12", "FOLATE", "FERRITIN", "TIBC", "IRON", "RETICCTPCT" in the last 72 hours. Sepsis Labs: Recent Labs  Lab 10/23/23 0733 10/24/23 0614 10/26/23 0313  WBC 6.1 6.9 4.2    Microbiology Recent Results (from the past 240 hours)  Culture, BAL-quantitative w Gram Stain     Status: Abnormal   Collection Time: 10/20/23  9:49 AM   Specimen: Bronchoalveolar Lavage; Respiratory  Result Value Ref Range Status   Specimen Description   Final    BRONCHIAL ALVEOLAR LAVAGE Performed at [facility], [address] [place], [place] [address]    Special Requests   Final    NONE Performed at [facility], [address] [place], [place] [address]    Gram Stain   Final    RARE WBC PRESENT,BOTH PMN AND MONONUCLEAR RARE GRAM POSITIVE RODS    Culture (A)  Final    20,000 COLONIES/mL Consistent with normal respiratory flora. Performed at [facility], [address] [place], [place] [address]    Report Status 10/22/2023 FINAL  Final  Respiratory (~20 pathogens) panel by PCR     Status: Abnormal   Collection Time: 10/25/23  4:15 PM   Specimen: Nasopharyngeal Swab; Respiratory  Result Value Ref Range Status   Adenovirus NOT DETECTED NOT DETECTED Final   Coronavirus 229E NOT DETECTED NOT DETECTED Final    Comment: (NOTE) The  Coronavirus on the Respiratory Panel, DOES NOT test for the novel  Coronavirus (2019 nCoV)    Coronavirus HKU1 NOT DETECTED NOT DETECTED Final   Coronavirus NL63 NOT DETECTED NOT DETECTED Final   Coronavirus OC43 NOT DETECTED NOT DETECTED Final   Metapneumovirus DETECTED (A) NOT DETECTED Final   Rhinovirus / Enterovirus NOT DETECTED NOT DETECTED Final   Influenza A NOT DETECTED NOT DETECTED Final   Influenza B NOT DETECTED NOT DETECTED Final   Parainfluenza Virus 1 NOT DETECTED NOT DETECTED Final   Parainfluenza Virus 2 NOT DETECTED NOT DETECTED Final   Parainfluenza Virus 3 NOT DETECTED NOT DETECTED Final   Parainfluenza Virus 4 NOT DETECTED NOT DETECTED Final   Respiratory Syncytial Virus NOT DETECTED NOT DETECTED Final   Bordetella pertussis NOT DETECTED NOT DETECTED Final   Bordetella Parapertussis NOT DETECTED NOT DETECTED Final   Chlamydophila pneumoniae NOT DETECTED NOT DETECTED Final   Mycoplasma pneumoniae NOT DETECTED NOT DETECTED Final    Comment: Performed at [facility], [address] [place], [place] [address]  SARS Coronavirus 2 by RT PCR (hospital order, performed in [facility] hospital lab) *cepheid single result test* Anterior Nasal Swab     Status: None   Collection Time: 10/25/23  4:15 PM   Specimen: Anterior Nasal Swab  Result Value Ref Range Status   SARS Coronavirus 2 by RT PCR NEGATIVE NEGATIVE Final    Comment: (NOTE) SARS-CoV-2 target nucleic acids are NOT DETECTED.  The SARS-CoV-2 RNA is generally detectable in upper and lower respiratory specimens during the acute phase of infection. The lowest concentration of SARS-CoV-2 viral copies this assay can detect is 250 copies / mL. A negative result does not preclude SARS-CoV-2 infection and should not be used as the sole basis for treatment or other patient management decisions.  A negative result may occur with improper specimen collection / handling, submission of specimen other than  nasopharyngeal swab, presence of viral mutation(s) within the areas targeted by this assay, and inadequate number of viral copies (<250 copies / mL). A negative result must be combined with clinical observations, patient history, and epidemiological information.  Fact Sheet for Patients:   RoadLapTop.co.za  Fact Sheet for Healthcare Providers: http://kim-miller.com/  This test is not yet approved or  cleared by the [place] FDA and has been authorized for detection and/or diagnosis of SARS-CoV-2 by FDA under an Emergency Use Authorization (EUA).  This EUA will remain in effect (meaning this test can be used) for the duration of the COVID-19 declaration under Section 564(b)(1) of the Act, 21 U.S.C. section 360bbb-3(b)(1), unless the authorization is terminated or revoked sooner.  Performed at [facility], [address] [place], [place] [address]     Procedures and diagnostic studies:  No results found.              LOS: 26 days   Abhijot Straughter  [employer]ered loss adjuster on [email or web address]. If 7PM-7AM, please contact night-coverage at [email or web address]     10/28/2023, 2:44 PM

## 2023-10-28 NOTE — Procedures (Signed)
Central Venous Catheter Insertion Procedure Note  Michele House  [id number]  02-21-1956  Date:10/28/23  Time:5:35 PM   Provider Performing:Jean-Pierre Cayleigh Paull   Procedure: Insertion of Non-tunneled Central Venous Catheter(36556) with US guidance (24401)   Indication(s) Medication administration  Consent Unable to obtain consent due to emergent nature of procedure.  Anesthesia Topical only with 1% lidocaine   Timeout Verified patient identification, verified procedure, site/side was marked, verified correct patient position, special equipment/implants available, medications/allergies/relevant history reviewed, required imaging and test results available.  Sterile Technique Maximal sterile technique including full sterile barrier drape, hand hygiene, sterile gown, sterile gloves, mask, hair covering, sterile ultrasound probe cover (if used).  Procedure Description Area of catheter insertion was cleaned with chlorhexidine and draped in sterile fashion.  With real-time ultrasound guidance a central venous catheter was placed into the right internal jugular vein. Nonpulsatile blood flow and easy flushing noted in all ports.  The catheter was sutured in place and sterile dressing applied.  Complications/Tolerance None; patient tolerated the procedure well. Chest X-ray is ordered to verify placement for internal jugular or subclavian cannulation.   Chest x-ray is not ordered for femoral cannulation.  EBL Minimal  Specimen(s) None   Janann Colonel, MD [facility] 10/28/2023 5:35 PM

## 2023-10-28 NOTE — Inpatient Diabetes Management (Signed)
Inpatient Diabetes Program Recommendations  AACE/ADA: New Consensus Statement on Inpatient Glycemic Control (2015)  Target Ranges:  Prepandial:   less than 140 mg/dL      Peak postprandial:   less than 180 mg/dL (1-2 hours)      Critically ill patients:  140 - 180 mg/dL   Lab Results  Component Value Date   GLUCAP 320 (H) 10/28/2023   HGBA1C 8.4 (H) 10/15/2023    Review of Glycemic Control  Latest Reference Range & Units 10/27/23 09:05 10/27/23 12:19 10/27/23 16:19 10/27/23 20:34 10/28/23 07:55  Glucose-Capillary 70 - 99 mg/dL 536 (H) 644 (H) 034 (H) 318 (H) 320 (H)  (H): Data is abnormally high Diabetes history: Type 2 DM Outpatient Diabetes medications: Glipizide 10 mg every day, Tradjenta 5 mg QD Current orders for Inpatient glycemic control: Novolog 0-15 units TID & HS, Semglee 18 units at bedtime Prednisone 30 mg QD  Inpatient Diabetes Program Recommendations:    Consider: -Increasing Semglee 24 units at bedtime -Novolog 3 units TID (Assuming patient is consuming >50% of meals)  Thanks, Lujean Rave, MSN, RNC-OB Diabetes Coordinator [phone] (8a-5p)

## 2023-10-28 NOTE — Consult Note (Signed)
PHARMACY CONSULT NOTE - ELECTROLYTES  Pharmacy Consult for Electrolyte Monitoring and Replacement   Recent Labs: Height: 5' (152.4 cm) Weight: 66.5 kg (146 lb 9.7 oz) IBW/kg (Calculated) : 45.5 Estimated Creatinine Clearance: 36.7 mL/min (A) (by C-G formula based on SCr of 1.23 mg/dL (H)). Potassium (mmol/L)  Date Value  10/28/2023 5.4 (H)  09/30/2013 3.7   Magnesium (mg/dL)  Date Value  95/62/1308 2.1   Calcium (mg/dL)  Date Value  65/78/4696 8.9   Calcium, Total (mg/dL)  Date Value  29/52/8413 9.2   Albumin (g/dL)  Date Value  24/40/1027 2.7 (L)  09/30/2013 3.7   Phosphorus (mg/dL)  Date Value  25/36/6440 3.6   Sodium (mmol/L)  Date Value  10/28/2023 129 (L)  09/30/2013 136    Assessment  Michele House is a 67 y.o. female presenting with shortness of breath. PMH significant for COPD, chronic respiratory failure on 2 L, hypertension, type 2 diabetes, HFpEF. Recent discharge from the hospital 24th November 2024 after hospitalization for COPD exacerbation complicated by respiratory failure. She presented to the hospital again on 10/02/2023 with chest pain, malaise, cough, shortness of breath, abdominal pain and dysuria. Pharmacy has been consulted to monitor and replace electrolytes.  Diet: NPO, intubated MIVF: N/A Pertinent medications: insulin gtt  Goal of Therapy: Electrolytes WNL  Plan:  K 5.4: patient started on insulin gtt after lab was drawn BMP checks ordered q4h while on insulin continuous infusion Will check all electrolytes with AM labs  Thank you for allowing pharmacy to be a part of this patient's care.  Bettey Costa, PharmD Clinical Pharmacist 10/28/2023 3:51 PM

## 2023-10-28 NOTE — Plan of Care (Signed)
  Problem: Health Behavior/Discharge Planning: Goal: Ability to identify and utilize available resources and services will improve Outcome: Progressing   Problem: Metabolic: Goal: Ability to maintain appropriate glucose levels will improve Outcome: Progressing   Problem: Nutritional: Goal: Maintenance of adequate nutrition will improve Outcome: Progressing   Problem: Skin Integrity: Goal: Risk for impaired skin integrity will decrease Outcome: Progressing

## 2023-10-28 NOTE — Consult Note (Signed)
Midge Minium, MD [facility]  [address] [place], [place] [address] Phone: [phone] Fax : [phone]  Consultation  Referring Provider:     Dr. Larinda Buttery Primary Care Physician:  [facility] Primary Gastroenterologist:       Gentry Fitz    Reason for Consultation:     Upper GI bleed  Date of Admission:  10/02/2023 Date of Consultation:  10/28/2023         HPI:   Michele House is a 67 y.o. female with a history of COPD, hypertension, GERD and a suspicious 1.5 cm right upper lobe nodule who has a history of encephalopathy.  The patient was admitted with shortness of breath and COPD exacerbation.  The patient was weaned off oxygen slowly and was on room air by the 22nd of this month. Patient started to decompensate and today was requiring the placement of a ET tube due to acute hypercapnic respiratory failure and acute metabolic encephalopathy.  The patient was also noted to have a GI bleed and was sent down to CT for CT angiography that showed active bleeding in the second portion of the duodenum. The patient was also found to have anemia with a hemoglobin of 6.4 with 2 days ago the hemoglobin was 9.1.  The patient had iron studies that were normal and a ferritin over 2000.  Past Medical History:  Diagnosis Date   Acid reflux    COPD (chronic obstructive pulmonary disease) (HCC)    Diabetes mellitus without complication (HCC)    Hyperlipidemia    Hypertension    Thyroid disease     Past Surgical History:  Procedure Laterality Date   appendectomy     APPENDECTOMY     BREAST BIOPSY Right    CORE W/CLIP - NEG   TOTAL VAGINAL HYSTERECTOMY     TUMOR REMOVAL     benign;stomach   VIDEO BRONCHOSCOPY WITH ENDOBRONCHIAL NAVIGATION Right 10/20/2023   Procedure: VIDEO BRONCHOSCOPY WITH ENDOBRONCHIAL NAVIGATION;  Surgeon: Vida Rigger, MD;  Location: [facility] ORS;  Service: Thoracic;  Laterality: Right;    Prior to Admission medications   Medication  Sig Start Date End Date Taking? Authorizing Provider  aspirin EC 81 MG tablet Take 81 mg by mouth daily.   Yes [provider]  atorvastatin (LIPITOR) 20 MG tablet Take 20 mg by mouth daily. 06/25/23  Yes [provider]  cetirizine (ZYRTEC) 10 MG tablet Take 10 mg by mouth daily.   Yes [provider]  COMBIVENT RESPIMAT 20-100 MCG/ACT AERS respimat Inhale into the lungs every 6 (six) hours as needed for wheezing or shortness of breath. 09/06/23  Yes [provider]  dextromethorphan-guaiFENesin (MUCINEX DM) 30-600 MG 12hr tablet Take 1 tablet by mouth 2 (two) times daily as needed for cough. 06/08/23  Yes Arnetha Courser, MD  diclofenac sodium (VOLTAREN) 1 % GEL Apply topically 4 (four) times daily.   Yes [provider]  DULERA 200-5 MCG/ACT AERO Inhale 2 puffs into the lungs every 12 (twelve) hours. 09/06/23  Yes [provider]  furosemide (LASIX) 20 MG tablet Take 1 tablet (20 mg total) by mouth daily. 09/27/23  Yes Hall, Carole N, DO  glipiZIDE (GLUCOTROL) 10 MG tablet Take 10 mg by mouth daily before breakfast.   Yes [provider]  ipratropium (ATROVENT HFA) 17 MCG/ACT inhaler Inhale 2 puffs into the lungs every 6 (six) hours.   Yes [provider]  levothyroxine (SYNTHROID, LEVOTHROID) 100 MCG tablet  Take 100 mcg by mouth daily before breakfast.   Yes [provider]  lisinopril-hydrochlorothiazide (PRINZIDE,ZESTORETIC) 20-25 MG per tablet Take 1 tablet by mouth daily.   Yes [provider]  Multiple Vitamin (MULTIVITAMIN) capsule Take 1 capsule by mouth daily.   Yes [provider]  predniSONE (STERAPRED UNI-PAK 21 TAB) 10 MG (21) TBPK tablet As directed on packaging 09/23/23  Yes Bradler, Clent Jacks, MD  pregabalin (LYRICA) 50 MG capsule Take 50 mg by mouth 2 (two) times daily.   Yes [provider]  sertraline (ZOLOFT) 100 MG tablet Take 100 mg by mouth daily. 06/28/23  Yes [provider]  TRADJENTA 5 MG TABS tablet Take 5 mg by mouth daily. 07/26/23  Yes [provider]    Family History  Problem Relation Age of Onset   Heart attack Mother    Hypertension Mother    Breast cancer Sister 90   Breast cancer Maternal Aunt        40'S   Breast cancer Maternal Grandmother      Social History   Tobacco Use   Smoking status: Former    Current packs/day: 0.00    Types: Cigarettes    Quit date: 12/15/1985    Years since quitting: 37.8   Smokeless tobacco: Never  Substance Use Topics   Alcohol use: No   Drug use: No    Allergies as of 10/02/2023 - Review Complete 10/02/2023  Allergen Reaction Noted   Penicillins Hives 06/07/2023   Aspirin Other (See Comments) 07/10/2015   Sulfa antibiotics  07/10/2015    Review of Systems:    All systems reviewed and negative except where noted in HPI.   Physical Exam:  Vital signs in last 24 hours: Temp:  [97.4 F (36.3 C)-98.3 F (36.8 C)] 98.3 F (36.8 C) (12/26 2002) Pulse Rate:  [63-150] 85 (12/26 2100) Resp:  [12-51] 14 (12/26 2100) BP: (51-176)/(34-91) 162/84 (12/26 2100) SpO2:  [85 %-100 %] 99 % (12/26 2100) FiO2 (%):  [34 %-100 %] 34 % (12/26 1822) Weight:  [66.5 kg] 66.5 kg (12/26 1431) Last BM Date : 10/26/23 General:   Sedated and intubated on a ventilator Head:  Normocephalic and atraumatic. Eyes:   No icterus.   Conjunctiva pink. PERRLA. Ears:  Normal auditory acuity. Neck:  Supple; no masses or thyroidomegaly Heart:  Regular rate and rhythm;  Without murmur, clicks, rubs or gallops Abdomen:  Soft, nondistended, nontender. Normal bowel sounds. No appreciable masses or hepatomegaly.  No rebound or guarding.  Rectal:  Not performed. Msk:  Symmetrical without gross deformities.  Extremities:  Without edema, cyanosis or clubbing. Neurologic: Unable to assess. Skin:  Intact without significant lesions or rashes. Cervical Nodes:  No significant cervical adenopathy. Psych:  Unable to  assess.  LAB RESULTS: Recent Labs    10/26/23 0313 10/28/23 1450 10/28/23 1532  WBC 4.2 17.6* 18.6*  HGB 9.1* 6.5* 6.4*  HCT 27.6* 19.9* 19.1*  PLT 200 595* 600*   BMET Recent Labs    10/26/23 0313 10/28/23 1450 10/28/23 2006  NA 133* 129* 135  K 4.4 5.4* 3.3*  CL 85* 83* 92*  CO2 36* 35* 28  GLUCOSE 222* 536* 329*  BUN 31* 84* 77*  CREATININE 0.85 1.23* 1.19*  CALCIUM 8.9 8.9 7.8*   LFT Recent Labs    10/28/23 1450  PROT 5.5*  ALBUMIN 2.7*  AST 30  ALT 27  ALKPHOS 71  BILITOT 0.6   PT/INR Recent Labs  10/28/23 1804  LABPROT 15.2  INR 1.2    STUDIES: CT Angio Chest Pulmonary Embolism (PE) W or WO Contrast Result Date: 10/28/2023 CLINICAL DATA:  Concern for pulmonary embolism. Pulmonary nodules concerning for neoplasm. EXAM: CT ANGIOGRAPHY CHEST WITH CONTRAST TECHNIQUE: Multidetector CT imaging of the chest was performed using the standard protocol during bolus administration of intravenous contrast. Multiplanar CT image reconstructions and MIPs were obtained to evaluate the vascular anatomy. RADIATION DOSE REDUCTION: This exam was performed according to the departmental dose-optimization program which includes automated exposure control, adjustment of the mA and/or kV according to patient size and/or use of iterative reconstruction technique. CONTRAST:  OMNIPAQUE IOHEXOL 350 MG/ML SOLN COMPARISON:  Chest CT dated 10/19/2023. FINDINGS: Cardiovascular: There is no cardiomegaly or pericardial effusion. There is moderate calcified and noncalcified plaque of the thoracic aorta. No aneurysmal dilatation or dissection. The origins of the great vessels of the aortic arch appear patent as visualized. No pulmonary artery embolus identified. Mediastinum/Nodes: No obvious hilar or mediastinal adenopathy. An enteric tube noted in the esophagus. No mediastinal fluid collection. Lungs/Pleura: Background of centrilobular emphysema. Similar appearance of 16 x 19 mm nodule  with spiculated margin in the anterior right upper lobe as well as additional smaller ground-glass nodule in the right upper lobe superiorly measuring 7 mm. There is a newly developed ovoid opacity in the anteromedial right upper lobe adjacent to the dominant nodule which may represent an impacted mucus or postobstructive pneumonia. A 12 mm ground-glass density in the right middle lobe appears similar to prior CT. There is however scattered micro nodularity in the right upper and right lower lobe, progressed since the prior CT concerning for an inflammatory/infectious process. No pleural effusion pneumothorax. The central airways remain patent. Upper Abdomen: Gallstones. Enteric tube with tip in the body of the stomach. Musculoskeletal: No acute osseous pathology. Review of the MIP images confirms the above findings. IMPRESSION: 1. No CT evidence of pulmonary artery embolus. 2. Similar appearance of 16 x 19 mm nodule with spiculated margin in the anterior right upper lobe most consistent with malignancy. Additional ground-glass nodules similar to prior CT. 3. Newly developed ovoid opacity in the anteromedial right upper lobe may represent an impacted mucus or postobstructive pneumonia. 4. Scattered micro nodularity in the right upper and right lower lobe, progressed since the prior CT concerning for an inflammatory/infectious process. 5. Cholelithiasis. 6. Aortic Atherosclerosis (ICD10-I70.0) and Emphysema (ICD10-J43.9). Electronically Signed   By: Elgie Collard M.D.   On: 10/28/2023 20:59   CT ANGIO GI BLEED Result Date: 10/28/2023 CLINICAL DATA:  GI hemorrhage EXAM: CTA ABDOMEN AND PELVIS WITHOUT AND WITH CONTRAST TECHNIQUE: Multidetector CT imaging of the abdomen and pelvis was performed using the standard protocol during bolus administration of intravenous contrast. Multiplanar reconstructed images and MIPs were obtained and reviewed to evaluate the vascular anatomy. RADIATION DOSE REDUCTION: This exam  was performed according to the departmental dose-optimization program which includes automated exposure control, adjustment of the mA and/or kV according to patient size and/or use of iterative reconstruction technique. CONTRAST:  OMNIPAQUE IOHEXOL 350 MG/ML SOLN COMPARISON:  None Available. FINDINGS: VASCULAR Aorta: Diffuse atherosclerotic calcifications are noted. No aneurysmal dilatation or dissection is seen. Celiac: Patent without evidence of aneurysm, dissection, vasculitis or significant stenosis. SMA: Patent without evidence of aneurysm, dissection, vasculitis or significant stenosis. Renals: Both renal arteries are patent without evidence of aneurysm, dissection, vasculitis, fibromuscular dysplasia or significant stenosis. IMA: Patent without evidence of aneurysm, dissection, vasculitis or significant stenosis. Inflow: Iliacs  demonstrate atherosclerotic calcifications without aneurysmal dilatation. Veins: No specific venous abnormality is identified. Review of the MIP images confirms the above findings. NON-VASCULAR Lower chest: No acute abnormality. Hepatobiliary: Liver is within normal limits. Gallbladder is well distended. Multiple small dependent stones are seen. No obstructive changes are seen. Pancreas: Unremarkable. No pancreatic ductal dilatation or surrounding inflammatory changes. Spleen: Normal in size without focal abnormality. Adrenals/Urinary Tract: Adrenal glands are within normal limits. Kidneys demonstrate a normal enhancement pattern. No renal calculi or obstructive changes are seen. Scattered simple cysts are noted bilaterally. No follow-up is recommended. The bladder is decompressed by Foley catheter. Stomach/Bowel: No obstructive or inflammatory changes of the colon are seen. The appendix is not well visualized consistent with given surgical history. Stomach is within normal limits. Gastric catheter is noted within the proximal stomach. In the region of the second portion of the  duodenum on the arterial phase images there are small areas of active GI hemorrhage identified with more diffuse blush seen on the venous delayed images consistent with duodenal hemorrhage. This may be related to an underlying ulcer. No other areas of active GI hemorrhage are seen. Lymphatic: No significant lymphadenopathy is seen. Reproductive: Status post hysterectomy. No adnexal masses. Other: No abdominal wall hernia or abnormality. No abdominopelvic ascites. Musculoskeletal: No acute or significant osseous findings. IMPRESSION: VASCULAR Active GI hemorrhage in the second portion of the duodenum which may be related to a duodenal ulcer. No other definitive areas of GI hemorrhage are seen. No other vascular abnormality is noted. NON-VASCULAR Cholelithiasis without complicating factors. No other focal abnormality is noted. These results will be called to the ordering clinician or representative by the Radiologist Assistant, and communication documented in the PACS or [employer]. Electronically Signed   By: Alcide Clever M.D.   On: 10/28/2023 20:49   DG Chest Port 1 View Result Date: 10/28/2023 CLINICAL DATA:  Status post central line placement EXAM: PORTABLE CHEST 1 VIEW COMPARISON:  Chest radiograph dated 10/28/2023 at 4:10 p.m. FINDINGS: Lines/tubes: Endotracheal tube tip projects 5.6 cm above the carina. Enteric tube tip reaches the diaphragm and terminates below the field of view. Right internal jugular venous catheter tip projects over the superior cavoatrial junction. Lungs: Well inflated lungs. Unchanged right pulmonary nodule. Patchy left lower lung opacities. Pleura: No pneumothorax or pleural effusion. Heart/mediastinum: The heart size and mediastinal contours are within normal limits. Bones: No acute osseous abnormality. IMPRESSION: 1. Right internal jugular venous catheter tip projects over the superior cavoatrial junction. No pneumothorax. 2. Patchy left lower lung opacities, which may  represent atelectasis, aspiration, or pneumonia. Electronically Signed   By: Agustin Cree M.D.   On: 10/28/2023 20:22   DG Chest Port 1 View Result Date: 10/28/2023 CLINICAL DATA:  Check endotracheal tube placement EXAM: PORTABLE CHEST 1 VIEW COMPARISON:  10/28/2023 FINDINGS: Endotracheal tube is now seen 5 cm above the carina. Gastric catheter extends into the stomach although the proximal side port lies in the distal esophagus. This should be advanced deeper into the stomach. Stable pulmonary nodule is noted in the right mid lung similar to that seen on prior CT from 10/19/2023. IMPRESSION: Endotracheal tube in satisfactory position. Gastric catheter should be advanced deeper into the stomach. Stable right pulmonary nodule. Electronically Signed   By: Alcide Clever M.D.   On: 10/28/2023 17:09   DG Chest Port 1 View Result Date: 10/28/2023 CLINICAL DATA:  Acute respiratory failure EXAM: PORTABLE CHEST - 1 VIEW COMPARISON:  10/24/2023 FINDINGS: Patient is rotated towards the  right. Lungs are clear, mildly hyperinflated. No pneumothorax. Heart size and mediastinal contours are within normal limits. No effusion. Visualized bones unremarkable. IMPRESSION: No acute cardiopulmonary disease. Electronically Signed   By: Corlis Leak M.D.   On: 10/28/2023 16:11      Impression / Plan:   Assessment: Principal Problem:   Acute metabolic encephalopathy Active Problems:   Essential hypertension   Type 2 diabetes mellitus with obesity (HCC)   COPD exacerbation (HCC)   Acute on chronic respiratory failure with hypoxia (HCC)   Lung nodule   Pneumonia   UTI (urinary tract infection)   (HFpEF) heart failure with preserved ejection fraction (HCC)   Uncontrolled type 2 diabetes mellitus with hypoglycemia, without long-term current use of insulin (HCC)   Hypotension   Hypernatremia   CVA (cerebral vascular accident) (HCC)   Michele House is a 67 y.o. y/o female with a complicated hospital course with COPD  exacerbation and respiratory distress requiring intubation.  The patient also had a drop in her hemoglobin and a CT angiography showed active bleeding in the duodenum.  Due to the active bleeding a GI consult was called earlier tonight.  Plan:  The patient will be set up for a urgent upper endoscopy to try and stop the bleeding from the second portion of duodenum.  I have spoken to the patient's daughter and explained to her the procedure including the risks and benefits especially since the patient is on 2 different pressor medications and having other medical issues at the present time.  The patient states that she wants everything done for her mother.  I have also explained that if bleeding cannot be controlled with an upper endoscopy she may need to be seen by vascular surgery versus general surgery for possible embolization versus bleeding control through a laparotomy.  The patient's daughter has been explained the plan and agrees with proceeding with the upper endoscopy.  Thank you for involving me in the care of this patient.      LOS: 26 days   Midge Minium, MD, [facility] 10/28/2023, 10:06 PM,  Pager [phone] 7am-5pm  Check AMION for 5pm -7am coverage and on weekends   Note: This dictation was prepared with Dragon dictation along with smaller phrase technology. Any transcriptional errors that result from this process are unintentional.

## 2023-10-29 ENCOUNTER — Inpatient Hospital Stay: Payer: 59

## 2023-10-29 ENCOUNTER — Encounter: Payer: Self-pay | Admitting: Gastroenterology

## 2023-10-29 DIAGNOSIS — R710 Precipitous drop in hematocrit: Secondary | ICD-10-CM | POA: Diagnosis not present

## 2023-10-29 DIAGNOSIS — G9341 Metabolic encephalopathy: Secondary | ICD-10-CM | POA: Diagnosis not present

## 2023-10-29 LAB — BASIC METABOLIC PANEL
Anion gap: 9 (ref 5–15)
Anion gap: 9 (ref 5–15)
Anion gap: 9 (ref 5–15)
BUN: 66 mg/dL — ABNORMAL HIGH (ref 8–23)
BUN: 69 mg/dL — ABNORMAL HIGH (ref 8–23)
BUN: 70 mg/dL — ABNORMAL HIGH (ref 8–23)
CO2: 28 mmol/L (ref 22–32)
CO2: 29 mmol/L (ref 22–32)
CO2: 31 mmol/L (ref 22–32)
Calcium: 7.8 mg/dL — ABNORMAL LOW (ref 8.9–10.3)
Calcium: 8 mg/dL — ABNORMAL LOW (ref 8.9–10.3)
Calcium: 8.5 mg/dL — ABNORMAL LOW (ref 8.9–10.3)
Chloride: 93 mmol/L — ABNORMAL LOW (ref 98–111)
Chloride: 96 mmol/L — ABNORMAL LOW (ref 98–111)
Chloride: 98 mmol/L (ref 98–111)
Creatinine, Ser: 1.02 mg/dL — ABNORMAL HIGH (ref 0.44–1.00)
Creatinine, Ser: 1.19 mg/dL — ABNORMAL HIGH (ref 0.44–1.00)
Creatinine, Ser: 1.24 mg/dL — ABNORMAL HIGH (ref 0.44–1.00)
GFR, Estimated: 47 mL/min — ABNORMAL LOW (ref >60)
GFR, Estimated: 49 mL/min — ABNORMAL LOW (ref >60)
GFR, Estimated: 60 mL/min — ABNORMAL LOW (ref >60)
Glucose, Bld: 121 mg/dL — ABNORMAL HIGH (ref 70–99)
Glucose, Bld: 203 mg/dL — ABNORMAL HIGH (ref 70–99)
Glucose, Bld: 334 mg/dL — ABNORMAL HIGH (ref 70–99)
Potassium: 4.5 mmol/L (ref 3.5–5.1)
Potassium: 4.8 mmol/L (ref 3.5–5.1)
Potassium: 6 mmol/L — ABNORMAL HIGH (ref 3.5–5.1)
Sodium: 133 mmol/L — ABNORMAL LOW (ref 135–145)
Sodium: 134 mmol/L — ABNORMAL LOW (ref 135–145)
Sodium: 135 mmol/L (ref 135–145)

## 2023-10-29 LAB — CBC
HCT: 25.9 % — ABNORMAL LOW (ref 36.0–46.0)
HCT: 27.8 % — ABNORMAL LOW (ref 36.0–46.0)
HCT: 28.7 % — ABNORMAL LOW (ref 36.0–46.0)
Hemoglobin: 10 g/dL — ABNORMAL LOW (ref 12.0–15.0)
Hemoglobin: 9.1 g/dL — ABNORMAL LOW (ref 12.0–15.0)
Hemoglobin: 9.7 g/dL — ABNORMAL LOW (ref 12.0–15.0)
MCH: 31.4 pg (ref 26.0–34.0)
MCH: 31.6 pg (ref 26.0–34.0)
MCH: 31.7 pg (ref 26.0–34.0)
MCHC: 34.8 g/dL (ref 30.0–36.0)
MCHC: 34.9 g/dL (ref 30.0–36.0)
MCHC: 35.1 g/dL (ref 30.0–36.0)
MCV: 89.3 fL (ref 80.0–100.0)
MCV: 90.8 fL (ref 80.0–100.0)
MCV: 90.8 fL (ref 80.0–100.0)
Platelets: 388 10*3/uL (ref 150–400)
Platelets: 452 10*3/uL — ABNORMAL HIGH (ref 150–400)
Platelets: 453 10*3/uL — ABNORMAL HIGH (ref 150–400)
RBC: 2.9 MIL/uL — ABNORMAL LOW (ref 3.87–5.11)
RBC: 3.06 MIL/uL — ABNORMAL LOW (ref 3.87–5.11)
RBC: 3.16 MIL/uL — ABNORMAL LOW (ref 3.87–5.11)
RDW: 14.6 % (ref 11.5–15.5)
RDW: 14.9 % (ref 11.5–15.5)
RDW: 15.2 % (ref 11.5–15.5)
WBC: 19.7 10*3/uL — ABNORMAL HIGH (ref 4.0–10.5)
WBC: 23.5 10*3/uL — ABNORMAL HIGH (ref 4.0–10.5)
WBC: 24.3 10*3/uL — ABNORMAL HIGH (ref 4.0–10.5)
nRBC: 2.2 % — ABNORMAL HIGH (ref 0.0–0.2)
nRBC: 3.8 % — ABNORMAL HIGH (ref 0.0–0.2)
nRBC: 5.4 % — ABNORMAL HIGH (ref 0.0–0.2)

## 2023-10-29 LAB — BLOOD GAS, ARTERIAL
Acid-Base Excess: 4 mmol/L — ABNORMAL HIGH (ref 0.0–2.0)
Bicarbonate: 30.6 mmol/L — ABNORMAL HIGH (ref 20.0–28.0)
FIO2: 0.34 %
MECHVT: 450 mL
O2 Saturation: 99.1 %
PEEP: 8 cmH2O
Patient temperature: 37
RATE: 14 {breaths}/min
pCO2 arterial: 53 mm[Hg] — ABNORMAL HIGH (ref 32–48)
pH, Arterial: 7.37 (ref 7.35–7.45)
pO2, Arterial: 94 mm[Hg] (ref 83–108)

## 2023-10-29 LAB — LACTIC ACID, PLASMA
Lactic Acid, Venous: 2 mmol/L (ref 0.5–1.9)
Lactic Acid, Venous: 2.6 mmol/L (ref 0.5–1.9)
Lactic Acid, Venous: 2.6 mmol/L (ref 0.5–1.9)

## 2023-10-29 LAB — GLUCOSE, CAPILLARY
Glucose-Capillary: 109 mg/dL — ABNORMAL HIGH (ref 70–99)
Glucose-Capillary: 120 mg/dL — ABNORMAL HIGH (ref 70–99)
Glucose-Capillary: 121 mg/dL — ABNORMAL HIGH (ref 70–99)
Glucose-Capillary: 153 mg/dL — ABNORMAL HIGH (ref 70–99)
Glucose-Capillary: 154 mg/dL — ABNORMAL HIGH (ref 70–99)
Glucose-Capillary: 158 mg/dL — ABNORMAL HIGH (ref 70–99)
Glucose-Capillary: 172 mg/dL — ABNORMAL HIGH (ref 70–99)
Glucose-Capillary: 173 mg/dL — ABNORMAL HIGH (ref 70–99)
Glucose-Capillary: 275 mg/dL — ABNORMAL HIGH (ref 70–99)
Glucose-Capillary: 306 mg/dL — ABNORMAL HIGH (ref 70–99)

## 2023-10-29 LAB — MAGNESIUM: Magnesium: 1.5 mg/dL — ABNORMAL LOW (ref 1.7–2.4)

## 2023-10-29 LAB — HEMOGLOBIN AND HEMATOCRIT, BLOOD
HCT: 23.9 % — ABNORMAL LOW (ref 36.0–46.0)
Hemoglobin: 8.3 g/dL — ABNORMAL LOW (ref 12.0–15.0)

## 2023-10-29 LAB — PHOSPHORUS: Phosphorus: 2.2 mg/dL — ABNORMAL LOW (ref 2.5–4.6)

## 2023-10-29 LAB — BETA-HYDROXYBUTYRIC ACID
Beta-Hydroxybutyric Acid: 0.96 mmol/L — ABNORMAL HIGH (ref 0.05–0.27)
Beta-Hydroxybutyric Acid: 0.06 mmol/L (ref 0.05–0.27)

## 2023-10-29 LAB — PROCALCITONIN: Procalcitonin: 0.57 ng/mL

## 2023-10-29 LAB — PREPARE RBC (CROSSMATCH)

## 2023-10-29 MED ORDER — KETAMINE HCL 50 MG/5ML IJ SOSY: 50.0000 mg | PREFILLED_SYRINGE | Freq: Once | INTRAMUSCULAR | Status: AC

## 2023-10-29 MED ORDER — SODIUM CHLORIDE 0.9% IV SOLUTION: Freq: Once | INTRAVENOUS | Status: DC

## 2023-10-29 MED ORDER — INSULIN ASPART 100 UNIT/ML IV SOLN
10.0000 [IU] | Freq: Once | INTRAVENOUS | Status: AC
  Administered 2023-10-29: 10 [IU] via INTRAVENOUS
  Filled 2023-10-29: qty 0.1

## 2023-10-29 MED ORDER — DEXAMETHASONE SODIUM PHOSPHATE 4 MG/ML IJ SOLN
INTRAMUSCULAR | Status: AC
  Administered 2023-10-29: 10 mg
  Filled 2023-10-29: qty 3

## 2023-10-29 MED ORDER — MAGNESIUM SULFATE 2 GM/50ML IV SOLN
2.0000 g | Freq: Once | INTRAVENOUS | Status: AC
  Administered 2023-10-29: 2 g via INTRAVENOUS
  Filled 2023-10-29: qty 50

## 2023-10-29 MED ORDER — SODIUM ZIRCONIUM CYCLOSILICATE 5 G PO PACK
10.0000 g | PACK | Freq: Once | ORAL | Status: AC
  Administered 2023-10-29: 10 g
  Filled 2023-10-29: qty 2

## 2023-10-29 MED ORDER — METRONIDAZOLE 500 MG/100ML IV SOLN
500.0000 mg | Freq: Two times a day (BID) | INTRAVENOUS | Status: DC
  Administered 2023-10-29 – 2023-10-31 (×4): 500 mg via INTRAVENOUS
  Filled 2023-10-29 (×5): qty 100

## 2023-10-29 MED ORDER — RACEPINEPHRINE HCL 2.25 % IN NEBU
INHALATION_SOLUTION | RESPIRATORY_TRACT | Status: AC
  Administered 2023-10-29: 0.5 mL via RESPIRATORY_TRACT
  Filled 2023-10-29: qty 0.5

## 2023-10-29 MED ORDER — ALBUTEROL SULFATE (2.5 MG/3ML) 0.083% IN NEBU
INHALATION_SOLUTION | RESPIRATORY_TRACT | Status: AC
  Administered 2023-10-29: 7.5 mg via RESPIRATORY_TRACT
  Filled 2023-10-29: qty 9

## 2023-10-29 MED ORDER — MIDAZOLAM HCL 2 MG/2ML IJ SOLN
INTRAMUSCULAR | Status: AC
  Administered 2023-10-29: 2 mg via INTRAVENOUS
  Filled 2023-10-29: qty 2

## 2023-10-29 MED ORDER — DEXTROSE 5 % IV SOLN
21.0000 mmol | Freq: Once | INTRAVENOUS | Status: AC
  Administered 2023-10-29: 21 mmol via INTRAVENOUS
  Filled 2023-10-29: qty 7

## 2023-10-29 MED ORDER — DEXAMETHASONE SODIUM PHOSPHATE 10 MG/ML IJ SOLN
10.0000 mg | Freq: Once | INTRAMUSCULAR | Status: DC
  Filled 2023-10-29: qty 1

## 2023-10-29 MED ORDER — KETAMINE HCL 50 MG/5ML IJ SOSY
PREFILLED_SYRINGE | INTRAMUSCULAR | Status: AC
  Administered 2023-10-29: 50 mg via INTRAVENOUS
  Filled 2023-10-29: qty 5

## 2023-10-29 MED ORDER — SODIUM CHLORIDE 0.9% IV SOLUTION: Freq: Once | INTRAVENOUS | Status: AC

## 2023-10-29 MED ORDER — ROCURONIUM BROMIDE 10 MG/ML (PF) SYRINGE
PREFILLED_SYRINGE | INTRAVENOUS | Status: AC
  Administered 2023-10-29: 60 mg via INTRAVENOUS
  Filled 2023-10-29: qty 10

## 2023-10-29 MED ORDER — RACEPINEPHRINE HCL 2.25 % IN NEBU: 0.5000 mL | INHALATION_SOLUTION | RESPIRATORY_TRACT | Status: DC | PRN

## 2023-10-29 MED ORDER — MIDAZOLAM HCL 2 MG/2ML IJ SOLN: 2.0000 mg | Freq: Once | INTRAMUSCULAR | Status: AC

## 2023-10-29 MED ORDER — ALBUTEROL (5 MG/ML) CONTINUOUS INHALATION SOLN
7.5000 mg/h | INHALATION_SOLUTION | RESPIRATORY_TRACT | Status: DC
  Administered 2023-10-29: 7.5 mg/h via RESPIRATORY_TRACT
  Filled 2023-10-29: qty 20

## 2023-10-29 MED ORDER — INSULIN ASPART 100 UNIT/ML IJ SOLN
0.0000 [IU] | INTRAMUSCULAR | Status: DC
  Administered 2023-10-29: 11 [IU] via SUBCUTANEOUS
  Administered 2023-10-29 (×4): 4 [IU] via SUBCUTANEOUS
  Administered 2023-10-30: 3 [IU] via SUBCUTANEOUS
  Administered 2023-10-30: 4 [IU] via SUBCUTANEOUS
  Administered 2023-10-30: 3 [IU] via SUBCUTANEOUS
  Administered 2023-10-30: 7 [IU] via SUBCUTANEOUS
  Administered 2023-10-31: 3 [IU] via SUBCUTANEOUS
  Filled 2023-10-29 (×10): qty 1

## 2023-10-29 MED ORDER — ARFORMOTEROL TARTRATE 15 MCG/2ML IN NEBU
15.0000 ug | INHALATION_SOLUTION | Freq: Two times a day (BID) | RESPIRATORY_TRACT | Status: DC
  Administered 2023-10-29 – 2023-12-02 (×64): 15 ug via RESPIRATORY_TRACT
  Filled 2023-10-29 (×69): qty 2

## 2023-10-29 MED ORDER — INSULIN GLARGINE-YFGN 100 UNIT/ML ~~LOC~~ SOLN
25.0000 [IU] | Freq: Every day | SUBCUTANEOUS | Status: DC
Start: 2023-10-29 — End: 2023-10-30
  Administered 2023-10-29 – 2023-10-30 (×2): 25 [IU] via SUBCUTANEOUS
  Filled 2023-10-29 (×2): qty 0.25

## 2023-10-29 MED ORDER — KETAMINE HCL-SODIUM CHLORIDE 1000-0.9 MG/100ML-% IV SOLN
0.5000 mg/kg/h | INTRAVENOUS | Status: DC
  Administered 2023-10-29 – 2023-10-30 (×4): 1 mg/kg/h via INTRAVENOUS
  Administered 2023-10-31 – 2023-11-01 (×3): 1.5 mg/kg/h via INTRAVENOUS
  Filled 2023-10-29 (×6): qty 100

## 2023-10-29 MED ORDER — CALCIUM GLUCONATE-NACL 1-0.675 GM/50ML-% IV SOLN
1.0000 g | Freq: Once | INTRAVENOUS | Status: AC
  Administered 2023-10-29: 1000 mg via INTRAVENOUS
  Filled 2023-10-29: qty 50

## 2023-10-29 MED ORDER — ROCURONIUM BROMIDE 10 MG/ML (PF) SYRINGE: 60.0000 mg | PREFILLED_SYRINGE | Freq: Once | INTRAVENOUS | Status: AC

## 2023-10-29 NOTE — Progress Notes (Signed)
PT Cancellation Note  Patient Details Name: Michele House MRN: [id number] DOB: 1956/01/31   Cancelled Treatment:    Reason Eval/Treat Not Completed: Medical issues which prohibited therapy (Patient now intubated and sedated post transfer to CCU.  Will complete orders at this time; please re-consult as medically appropriate.)  Jaecion Dempster H. Manson Passey, PT, DPT, NCS 10/29/23, 9:25 AM [phone]

## 2023-10-29 NOTE — Progress Notes (Signed)
Pt self extubated, placed on NIV 5/5 through [facility]. Racemic epi neb administered per order.

## 2023-10-29 NOTE — Progress Notes (Signed)
BRIEF PCCM NOTE Pt's hemoglobin has down trended from 10.0 first thing this morning to 9.7 to 9.1, and now 8.3. No obvious melena or blood emesis from OG, Hemodynamics about the same, but continues to require Levophed and Vasopressin.  Discussed with Dr. Servando Snare of GI earlier today, and his recommendation was that if counts continue to drop, to consult IR for embolization of duodenal ulcer.  Called and discussed case with Dr. Lowella Dandy of IR, who agrees to evaluate and perform embolization.  Will type and screen 2 units and keep 2 units of pRBC's ahead, with plan to transfuse for Hgb <8.0.   Called and updated pt's daughter Marin Roberts of update and that IR will be contacting her about embolization.    Harlon Ditty, AGACNP-BC [facility] Prefer epic messenger for cross cover needs If after hours, please call E-link

## 2023-10-29 NOTE — Progress Notes (Signed)
Michele Minium, MD [facility]   [address], [address] [place], [place] [address] Phone: [phone] Fax : [phone]   Subjective: The patient remains intubated and has had no further sign of any GI bleeding.  The NG tube suction has decreased in output of blood and no further rectal bleeding.  The patient's visible vessel was treated with epinephrine, Endo Clip and cautery yesterday.  Her hemoglobin continues to trend down.   Objective: Vital signs in last 24 hours: Vitals:   10/29/23 1221 10/29/23 1245 10/29/23 1300 10/29/23 1315  BP:  136/76 (!) 96/55 129/66  Pulse:  (!) 113 (!) 102 (!) 105  Resp:  20 19 20   Temp:      TempSrc:      SpO2: 100% 97% 97% 98%  Weight:      Height:       Weight change:   Intake/Output Summary (Last 24 hours) at 10/29/2023 1323 Last data filed at 10/29/2023 1316 Gross per 24 hour  Intake 3265.84 ml  Output 1390 ml  Net 1875.84 ml     Exam: Heart:: Regular rate and rhythm, S1S2 present, or without murmur or extra heart sounds Lungs: normal and clear to auscultation and percussion Abdomen: soft, nontender, normal bowel sounds   Lab Results: @LABTEST2 @ Micro Results: Recent Results (from the past 240 hours)  Culture, BAL-quantitative w Gram Stain     Status: Abnormal   Collection Time: 10/20/23  9:49 AM   Specimen: Bronchoalveolar Lavage; Respiratory  Result Value Ref Range Status   Specimen Description   Final    BRONCHIAL ALVEOLAR LAVAGE Performed at [facility], [address] [place], [place] [address]    Special Requests   Final    NONE Performed at [facility], [address] [place], [place] [address]    Gram Stain   Final    RARE WBC PRESENT,BOTH PMN AND MONONUCLEAR RARE GRAM POSITIVE RODS    Culture (A)  Final    20,000 COLONIES/mL Consistent with normal respiratory flora. Performed at [facility], [address], [place], [place] [address]    Report Status 10/22/2023 FINAL  Final   Respiratory (~20 pathogens) panel by PCR     Status: Abnormal   Collection Time: 10/25/23  4:15 PM   Specimen: Nasopharyngeal Swab; Respiratory  Result Value Ref Range Status   Adenovirus NOT DETECTED NOT DETECTED Final   Coronavirus 229E NOT DETECTED NOT DETECTED Final    Comment: (NOTE) The Coronavirus on the Respiratory Panel, DOES NOT test for the novel  Coronavirus (2019 nCoV)    Coronavirus HKU1 NOT DETECTED NOT DETECTED Final   Coronavirus NL63 NOT DETECTED NOT DETECTED Final   Coronavirus OC43 NOT DETECTED NOT DETECTED Final   Metapneumovirus DETECTED (A) NOT DETECTED Final   Rhinovirus / Enterovirus NOT DETECTED NOT DETECTED Final   Influenza A NOT DETECTED NOT DETECTED Final   Influenza B NOT DETECTED NOT DETECTED Final   Parainfluenza Virus 1 NOT DETECTED NOT DETECTED Final   Parainfluenza Virus 2 NOT DETECTED NOT DETECTED Final   Parainfluenza Virus 3 NOT DETECTED NOT DETECTED Final   Parainfluenza Virus 4 NOT DETECTED NOT DETECTED Final   Respiratory Syncytial Virus NOT DETECTED NOT DETECTED Final   Bordetella pertussis NOT DETECTED NOT DETECTED Final   Bordetella Parapertussis NOT DETECTED NOT DETECTED Final   Chlamydophila pneumoniae NOT DETECTED NOT DETECTED Final   Mycoplasma pneumoniae NOT DETECTED NOT DETECTED Final    Comment: Performed at [facility], [address] [place], [place] [address]  SARS Coronavirus 2 by RT PCR (hospital order, performed in [facility] hospital lab) *cepheid single result test* Anterior Nasal Swab     Status: None   Collection Time: 10/25/23  4:15 PM   Specimen: Anterior Nasal Swab  Result Value Ref Range Status   SARS Coronavirus 2 by RT PCR NEGATIVE NEGATIVE Final    Comment: (NOTE) SARS-CoV-2 target nucleic acids are NOT DETECTED.  The SARS-CoV-2 RNA is generally detectable in upper and lower respiratory specimens during the acute phase of infection. The lowest concentration of SARS-CoV-2 viral copies this assay can  detect is 250 copies / mL. A negative result does not preclude SARS-CoV-2 infection and should not be used as the sole basis for treatment or other patient management decisions.  A negative result may occur with improper specimen collection / handling, submission of specimen other than nasopharyngeal swab, presence of viral mutation(s) within the areas targeted by this assay, and inadequate number of viral copies (<250 copies / mL). A negative result must be combined with clinical observations, patient history, and epidemiological information.  Fact Sheet for Patients:   RoadLapTop.co.za  Fact Sheet for Healthcare Providers: http://kim-miller.com/  This test is not yet approved or  cleared by the [place] FDA and has been authorized for detection and/or diagnosis of SARS-CoV-2 by FDA under an Emergency Use Authorization (EUA).  This EUA will remain in effect (meaning this test can be used) for the duration of the COVID-19 declaration under Section 564(b)(1) of the Act, 21 U.S.C. section 360bbb-3(b)(1), unless the authorization is terminated or revoked sooner.  Performed at [facility], [address] [place], [place] [address]   Culture, blood (Routine X 2) w Reflex to ID Panel     Status: None (Preliminary result)   Collection Time: 10/28/23  3:23 PM   Specimen: BLOOD  Result Value Ref Range Status   Specimen Description BLOOD LEFT ANTECUBITAL  Final   Special Requests   Final    BOTTLES DRAWN AEROBIC AND ANAEROBIC Blood Culture adequate volume   Culture   Final    NO GROWTH < 24 HOURS Performed at [facility], [address], [place], [place] [address]    Report Status PENDING  Incomplete  Culture, blood (Routine X 2) w Reflex to ID Panel     Status: None (Preliminary result)   Collection Time: 10/28/23  3:32 PM   Specimen: BLOOD  Result Value Ref Range Status   Specimen Description BLOOD BLOOD LEFT  HAND  Final   Special Requests   Final    BOTTLES DRAWN AEROBIC AND ANAEROBIC Blood Culture results may not be optimal due to an inadequate volume of blood received in culture bottles   Culture   Final    NO GROWTH < 24 HOURS Performed at [facility], [address] [place], [place] [address]    Report Status PENDING  Incomplete  MRSA Next Gen by PCR, Nasal     Status: None   Collection Time: 10/28/23  4:58 PM   Specimen: Nasal Mucosa; Nasal Swab  Result Value Ref Range Status   MRSA by PCR Next Gen NOT DETECTED NOT DETECTED Final    Comment: (NOTE) The GeneXpert MRSA Assay (FDA approved for NASAL specimens only), is one component of a comprehensive MRSA colonization surveillance program. It is not intended to diagnose MRSA infection nor to guide or monitor treatment for MRSA infections. Test performance is  not FDA approved in patients less than 21 years old. Performed at [facility], [address] [place], [place] [address]   Culture, Respiratory w Gram Stain     Status: None (Preliminary result)   Collection Time: 10/28/23  6:41 PM   Specimen: Tracheal Aspirate; Respiratory  Result Value Ref Range Status   Specimen Description   Final    TRACHEAL ASPIRATE Performed at [facility], [address] [place], [place] [address]    Special Requests   Final    NONE Performed at [facility], [address] [place], [place] [address]    Gram Stain   Final    RARE WBC PRESENT, PREDOMINANTLY PMN NO ORGANISMS SEEN    Culture   Final    TOO YOUNG TO READ Performed at [facility], [address] [place], [place] [address]    Report Status PENDING  Incomplete   Studies/Results: DG Abd 1 View Result Date: 10/29/2023 CLINICAL DATA:  OG tube placement EXAM: ABDOMEN - 1 VIEW COMPARISON:  12/26/2006 FINDINGS: OG tube is in the mid stomach. Prior cholecystectomy. Visualized lung bases clear. IMPRESSION: OG tube in the stomach.  Electronically Signed   By: Charlett Nose M.D.   On: 10/29/2023 02:43   CT Angio Chest Pulmonary Embolism (PE) W or WO Contrast Result Date: 10/28/2023 CLINICAL DATA:  Concern for pulmonary embolism. Pulmonary nodules concerning for neoplasm. EXAM: CT ANGIOGRAPHY CHEST WITH CONTRAST TECHNIQUE: Multidetector CT imaging of the chest was performed using the standard protocol during bolus administration of intravenous contrast. Multiplanar CT image reconstructions and MIPs were obtained to evaluate the vascular anatomy. RADIATION DOSE REDUCTION: This exam was performed according to the departmental dose-optimization program which includes automated exposure control, adjustment of the mA and/or kV according to patient size and/or use of iterative reconstruction technique. CONTRAST:  OMNIPAQUE IOHEXOL 350 MG/ML SOLN COMPARISON:  Chest CT dated 10/19/2023. FINDINGS: Cardiovascular: There is no cardiomegaly or pericardial effusion. There is moderate calcified and noncalcified plaque of the thoracic aorta. No aneurysmal dilatation or dissection. The origins of the great vessels of the aortic arch appear patent as visualized. No pulmonary artery embolus identified. Mediastinum/Nodes: No obvious hilar or mediastinal adenopathy. An enteric tube noted in the esophagus. No mediastinal fluid collection. Lungs/Pleura: Background of centrilobular emphysema. Similar appearance of 16 x 19 mm nodule with spiculated margin in the anterior right upper lobe as well as additional smaller ground-glass nodule in the right upper lobe superiorly measuring 7 mm. There is a newly developed ovoid opacity in the anteromedial right upper lobe adjacent to the dominant nodule which may represent an impacted mucus or postobstructive pneumonia. A 12 mm ground-glass density in the right middle lobe appears similar to prior CT. There is however scattered micro nodularity in the right upper and right lower lobe, progressed since the prior CT  concerning for an inflammatory/infectious process. No pleural effusion pneumothorax. The central airways remain patent. Upper Abdomen: Gallstones. Enteric tube with tip in the body of the stomach. Musculoskeletal: No acute osseous pathology. Review of the MIP images confirms the above findings. IMPRESSION: 1. No CT evidence of pulmonary artery embolus. 2. Similar appearance of 16 x 19 mm nodule with spiculated margin in the anterior right upper lobe most consistent with malignancy. Additional ground-glass nodules similar to prior CT. 3. Newly developed ovoid opacity in the anteromedial right upper lobe may represent an impacted mucus or postobstructive pneumonia. 4. Scattered micro nodularity in the right upper and  right lower lobe, progressed since the prior CT concerning for an inflammatory/infectious process. 5. Cholelithiasis. 6. Aortic Atherosclerosis (ICD10-I70.0) and Emphysema (ICD10-J43.9). Electronically Signed   By: Elgie Collard M.D.   On: 10/28/2023 20:59   CT ANGIO GI BLEED Result Date: 10/28/2023 CLINICAL DATA:  GI hemorrhage EXAM: CTA ABDOMEN AND PELVIS WITHOUT AND WITH CONTRAST TECHNIQUE: Multidetector CT imaging of the abdomen and pelvis was performed using the standard protocol during bolus administration of intravenous contrast. Multiplanar reconstructed images and MIPs were obtained and reviewed to evaluate the vascular anatomy. RADIATION DOSE REDUCTION: This exam was performed according to the departmental dose-optimization program which includes automated exposure control, adjustment of the mA and/or kV according to patient size and/or use of iterative reconstruction technique. CONTRAST:  OMNIPAQUE IOHEXOL 350 MG/ML SOLN COMPARISON:  None Available. FINDINGS: VASCULAR Aorta: Diffuse atherosclerotic calcifications are noted. No aneurysmal dilatation or dissection is seen. Celiac: Patent without evidence of aneurysm, dissection, vasculitis or significant stenosis. SMA: Patent without  evidence of aneurysm, dissection, vasculitis or significant stenosis. Renals: Both renal arteries are patent without evidence of aneurysm, dissection, vasculitis, fibromuscular dysplasia or significant stenosis. IMA: Patent without evidence of aneurysm, dissection, vasculitis or significant stenosis. Inflow: Iliacs demonstrate atherosclerotic calcifications without aneurysmal dilatation. Veins: No specific venous abnormality is identified. Review of the MIP images confirms the above findings. NON-VASCULAR Lower chest: No acute abnormality. Hepatobiliary: Liver is within normal limits. Gallbladder is well distended. Multiple small dependent stones are seen. No obstructive changes are seen. Pancreas: Unremarkable. No pancreatic ductal dilatation or surrounding inflammatory changes. Spleen: Normal in size without focal abnormality. Adrenals/Urinary Tract: Adrenal glands are within normal limits. Kidneys demonstrate a normal enhancement pattern. No renal calculi or obstructive changes are seen. Scattered simple cysts are noted bilaterally. No follow-up is recommended. The bladder is decompressed by Foley catheter. Stomach/Bowel: No obstructive or inflammatory changes of the colon are seen. The appendix is not well visualized consistent with given surgical history. Stomach is within normal limits. Gastric catheter is noted within the proximal stomach. In the region of the second portion of the duodenum on the arterial phase images there are small areas of active GI hemorrhage identified with more diffuse blush seen on the venous delayed images consistent with duodenal hemorrhage. This may be related to an underlying ulcer. No other areas of active GI hemorrhage are seen. Lymphatic: No significant lymphadenopathy is seen. Reproductive: Status post hysterectomy. No adnexal masses. Other: No abdominal wall hernia or abnormality. No abdominopelvic ascites. Musculoskeletal: No acute or significant osseous findings.  IMPRESSION: VASCULAR Active GI hemorrhage in the second portion of the duodenum which may be related to a duodenal ulcer. No other definitive areas of GI hemorrhage are seen. No other vascular abnormality is noted. NON-VASCULAR Cholelithiasis without complicating factors. No other focal abnormality is noted. These results will be called to the ordering clinician or representative by the Radiologist Assistant, and communication documented in the PACS or [employer]. Electronically Signed   By: Alcide Clever M.D.   On: 10/28/2023 20:49   DG Chest Port 1 View Result Date: 10/28/2023 CLINICAL DATA:  Status post central line placement EXAM: PORTABLE CHEST 1 VIEW COMPARISON:  Chest radiograph dated 10/28/2023 at 4:10 p.m. FINDINGS: Lines/tubes: Endotracheal tube tip projects 5.6 cm above the carina. Enteric tube tip reaches the diaphragm and terminates below the field of view. Right internal jugular venous catheter tip projects over the superior cavoatrial junction. Lungs: Well inflated lungs. Unchanged right pulmonary nodule. Patchy left lower lung opacities. Pleura:  No pneumothorax or pleural effusion. Heart/mediastinum: The heart size and mediastinal contours are within normal limits. Bones: No acute osseous abnormality. IMPRESSION: 1. Right internal jugular venous catheter tip projects over the superior cavoatrial junction. No pneumothorax. 2. Patchy left lower lung opacities, which may represent atelectasis, aspiration, or pneumonia. Electronically Signed   By: Agustin Cree M.D.   On: 10/28/2023 20:22   DG Chest Port 1 View Result Date: 10/28/2023 CLINICAL DATA:  Check endotracheal tube placement EXAM: PORTABLE CHEST 1 VIEW COMPARISON:  10/28/2023 FINDINGS: Endotracheal tube is now seen 5 cm above the carina. Gastric catheter extends into the stomach although the proximal side port lies in the distal esophagus. This should be advanced deeper into the stomach. Stable pulmonary nodule is noted in the right mid  lung similar to that seen on prior CT from 10/19/2023. IMPRESSION: Endotracheal tube in satisfactory position. Gastric catheter should be advanced deeper into the stomach. Stable right pulmonary nodule. Electronically Signed   By: Alcide Clever M.D.   On: 10/28/2023 17:09   DG Chest Port 1 View Result Date: 10/28/2023 CLINICAL DATA:  Acute respiratory failure EXAM: PORTABLE CHEST - 1 VIEW COMPARISON:  10/24/2023 FINDINGS: Patient is rotated towards the right. Lungs are clear, mildly hyperinflated. No pneumothorax. Heart size and mediastinal contours are within normal limits. No effusion. Visualized bones unremarkable. IMPRESSION: No acute cardiopulmonary disease. Electronically Signed   By: Corlis Leak M.D.   On: 10/28/2023 16:11   Medications: I have reviewed the patient's current medications. Scheduled Meds:  sodium chloride   Intravenous Once   arformoterol  15 mcg Nebulization BID   Chlorhexidine Gluconate Cloth  6 each Topical Daily   docusate  100 mg Per Tube BID   fentaNYL (SUBLIMAZE) injection  25 mcg Intravenous Once   insulin aspart  0-20 Units Subcutaneous Q4H   insulin glargine-yfgn  25 Units Subcutaneous Daily   ipratropium-albuterol  3 mL Nebulization Q4H   levothyroxine  100 mcg Oral Q0600   methylPREDNISolone (SOLU-MEDROL) injection  40 mg Intravenous Q24H   pantoprazole (PROTONIX) IV  40 mg Intravenous Q12H   polyethylene glycol  17 g Oral Daily   rOPINIRole  4 mg Oral QHS   senna-docusate  1 tablet Oral Daily   sertraline  50 mg Oral Daily   Continuous Infusions:  albuterol     ceFEPime (MAXIPIME) IV Stopped (10/29/23 1125)   dexmedetomidine (PRECEDEX) IV infusion Stopped (10/29/23 1133)   fentaNYL infusion INTRAVENOUS Stopped (10/29/23 1117)   metronidazole     norepinephrine (LEVOPHED) Adult infusion 8 mcg/min (10/29/23 1316)   sodium PHOSPHATE IVPB (in mmol) 43 mL/hr at 10/29/23 1316   vasopressin Stopped (10/29/23 1245)   PRN Meds:.acetaminophen, bisacodyl,  dextromethorphan-guaiFENesin, EPINEPHrine, fentaNYL, hydrOXYzine, ipratropium-albuterol, menthol-cetylpyridinium, midazolam, ondansetron (ZOFRAN) IV, mouth rinse, sodium chloride   Assessment: Principal Problem:   Acute metabolic encephalopathy Active Problems:   Essential hypertension   Type 2 diabetes mellitus with obesity (HCC)   COPD exacerbation (HCC)   Acute on chronic respiratory failure with hypoxia (HCC)   Lung nodule   Pneumonia   UTI (urinary tract infection)   (HFpEF) heart failure with preserved ejection fraction (HCC)   Uncontrolled type 2 diabetes mellitus with hypoglycemia, without long-term current use of insulin (HCC)   Hypotension   Hypernatremia   CVA (cerebral vascular accident) (HCC)   Melena   Gastrointestinal hemorrhage with melena   Duodenal ulcer    Plan: The patient had attempted control of bleeding with the upper endoscopy last evening  due to a positive CT scan angiography showing small bowel bleeding.  The lesion was treated with cautery epinephrine and a Endo Clip.  The patient's hemoglobin has continued to trend down.  If the patient should have any further sign of bleeding the patient should undergo embolization with the Endo Clip being able to guide where the vessel is.  This could be done by interventional radiology versus vascular surgery.  I discussed with the ICU team the plan.   LOS: 27 days   Sherlyn Hay 10/29/2023, 1:23 PM Pager [phone] 7am-5pm  Check AMION for 5pm -7am coverage and on weekends

## 2023-10-29 NOTE — Procedures (Signed)
Intubation Procedure Note  Michele House  [id number]  [birth year]  Date:10/29/23  Time:2:00 PM   Provider Performing:Iyona Pehrson D Elvina Sidle    Procedure: Intubation (31500)  Indication(s) Respiratory Failure  Consent Unable to obtain consent due to emergent nature of procedure.   Anesthesia Versed, Rocuronium, and Ketamine   Time Out Verified patient identification, verified procedure, site/side was marked, verified correct patient position, special equipment/implants available, medications/allergies/relevant history reviewed, required imaging and test results available.   Sterile Technique Usual hand hygeine, masks, and gloves were used   Procedure Description Patient positioned in bed supine.  Sedation given as noted above.  Patient was intubated with endotracheal tube using Glidescope.  View was Grade 1 full glottis .  Number of attempts was 1.  Colorimetric CO2 detector was consistent with tracheal placement.   Complications/Tolerance None; patient tolerated the procedure well. Chest X-ray is ordered to verify placement.   EBL Minimal   Specimen(s) None   Pt required reintubation following self extubation  Size 7.5 ETT Tube secured at 21 cm at the lip.    Harlon Ditty, AGACNP-BC [facility] Prefer epic messenger for cross cover needs If after hours, please call E-link

## 2023-10-29 NOTE — Progress Notes (Signed)
Interventional Radiology was called about decreasing hemoglobin and known bleeding duodenal ulceration with recent endoscopic intervention.  ICU team cannot send an ICU nurse to IR suite this evening due to staff shortages.  Discussed with ICU team and will plan for visceral angiography and empiric embolization of gastroduodenal artery tomorrow on 10/30/23 when ICU can send a nurse with patient.

## 2023-10-29 NOTE — Progress Notes (Signed)
OT Cancellation Note  Patient Details Name: Michele House MRN: [id number] DOB: 1956/10/28   Cancelled Treatment:    Reason Eval/Treat Not Completed: Medical issues which prohibited therapy. Chart reviewed. Patient now intubated and sedated post transfer to CCU. Will complete orders at this time; please re-consult as medically appropriate.   Arman Filter., MPH, MS, OTR/L ascom [phone] 10/29/23, 10:11 AM

## 2023-10-29 NOTE — Progress Notes (Signed)
NAME:  Michele House, MRN:  [id number], DOB:  Nov 30, 1955, LOS: 27 ADMISSION DATE:  10/02/2023, CONSULTATION DATE: 10/28/2023 REFERRING MD: Dr. Myriam Forehand, CHIEF COMPLAINT: Shortness of Breath   History of Present Illness:  Michele House is a 67 y.o. female with medical history significant of COPD, chronic respiratory failure on 2 L, hypertension, type 2 diabetes, HFpEF, recent discharge from the hospital 24th November 2024 after hospitalization for COPD exacerbation complicated by respiratory failure.  She presented to the hospital again on 10/02/2023 with chest pain, malaise, cough, shortness of breath, abdominal pain and dysuria.   She was admitted to the hospital for COPD exacerbation, acute on chronic hypoxemic respiratory failure, UTI and pneumonia.   She became more confused in the hospital and MRI brain revealed acute stroke.  Please see "Significant Hospital Events" section below for full detailed hospital course.    Pertinent  Medical History  Acid Reflux  COPD Type II Diabetes Mellitus  HLD HTN Thyroid Disease    Micro Data:  11/30: Strep pneumo & Legionella urinary antigens>> negative 11/30: Urine>>>=100,000 COLONIES/mL GROUP B STREP(S.AGALACTIAE) 11/30: Blood cultures x2>> no growth 12/6: CSF>> no growth 12/18: BAL>> normal respiratory flora 12/23: RVP>> + Metapneumovirus 12/23: COVID-19 PCR>>negative 12/26: Blood culture x2>> no growth to date 12/26: MRSA PCR>>negative 12/26: Tracheal aspirate>>  Antimicrobials:   Anti-infectives (From admission, onward)    Start     Dose/Rate Route Frequency Ordered Stop   10/30/23 2200  Vancomycin (VANCOCIN) 1,500 mg in sodium chloride 0.9 % 500 mL IVPB  Status:  Discontinued        1,500 mg 250 mL/hr over 120 Minutes Intravenous Every 48 hours 10/28/23 1956 10/29/23 0741   10/28/23 2200  ceFEPIme (MAXIPIME) 2 g in sodium chloride 0.9 % 100 mL IVPB        2 g 200 mL/hr over 30 Minutes Intravenous Every 12 hours 10/28/23  2003     10/28/23 2045  vancomycin (VANCOCIN) 1,500 mg in sodium chloride 0.9 % 500 mL IVPB        1,500 mg 257.5 mL/hr over 120 Minutes Intravenous  Once 10/28/23 1954 10/28/23 2050   10/04/23 1000  azithromycin (ZITHROMAX) tablet 500 mg        500 mg Oral Daily 10/03/23 0751 10/06/23 0856   10/03/23 0600  cefTRIAXone (ROCEPHIN) 2 g in sodium chloride 0.9 % 100 mL IVPB  Status:  Discontinued        2 g 200 mL/hr over 30 Minutes Intravenous Every 24 hours 10/02/23 0740 10/07/23 0854   10/02/23 0745  cefTRIAXone (ROCEPHIN) 2 g in sodium chloride 0.9 % 100 mL IVPB  Status:  Discontinued        2 g 200 mL/hr over 30 Minutes Intravenous Every 24 hours 10/02/23 0738 10/02/23 0740   10/02/23 0745  azithromycin (ZITHROMAX) 500 mg in sodium chloride 0.9 % 250 mL IVPB        500 mg 250 mL/hr over 60 Minutes Intravenous Every 24 hours 10/02/23 0738 10/03/23 1236   10/02/23 0630  cefTRIAXone (ROCEPHIN) 2 g in sodium chloride 0.9 % 100 mL IVPB        2 g 200 mL/hr over 30 Minutes Intravenous  Once 10/02/23 0617 10/02/23 0714      Significant Hospital Events: Including procedures, antibiotic start and stop dates in addition to other pertinent events   12/01: Vital stable, respiratory viral panel negative, procalcitonin negative, preliminary blood cultures negative.  Urine cultures are ordered as add-on, ammonia levels  mildly elevated at 39, strep pneumo negative, CBG elevated at 244, history of enlarging pulmonary nodule with recommendations for PET/CT during most recent admission which has not been done yet, if video bronchoscopy was scheduled for 10/13/2023 as outpatient. 12/02: Unable to take care of herself and son cannot provide the required care as she required 24-hour supervision due to worsening dementia.  PT is recommending SNF. 12/03: Blood pressure started trending up, restarting home Zestoretic.  Urine cultures with strep agalactiae, penicillin allergy noted, will continue with ceftriaxone  for now.  Also started on Remeron for concern of worsening depression and unable to sleep at night  12/05:  Patient has some blurry vision and weakness and pain.  MRI of the brain negative for acute intracranial abnormality. 12/08:  Patient more confused today than yesterday.  Able to follow some simple commands but not as good of a conversation today as yesterday. 12/09:  Patient still confused today.  Does not follow simple commands today. 12/10: Patient more talkative today but was looking up at the ceiling when she was talking with me.  She does not move her extremities to command but does move them on her own.  Physical therapy and Occupational Therapy did not see her move her right arm. 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  Mild associated petechial hemorrhage.  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 12/12: Patient with new stroke, CTA head and neck with no large vessel occlusion.  Patient had a recent echocardiogram which was normal. Concern of paraneoplastic versus hypercoagulability secondary to malignancy.  Patient missed her appointment for bronchoscopy and outpatient appointment for PET scan due to recurrent hospitalizations. 12/13: Patient continued to have waxing and waning mental status, unable to move right upper and lower extremities.  Having some hallucination.  Pulmonary is planning lung biopsy on 12/27 if she remains stable.  Rapidly progressive dementia and now with underlying stroke, question of paraneoplastic encephalitis and hypercoagulability secondary to underlying undiagnosed malignancy. 12/15: Patient with much improved mentation.  Having some flickering of movements on right upper and lower extremity today, left extremities weaker but seems improving.   12/18: Pt underwent  bronchoscopy and lung biopsy 12/19: patient stable , she is being optimized for dc for rehab.  She's cleared from pulmonary for discharge. Results from bronch will take 1 week.   12/26: Pt transferred to the stepdown unit with acute hypercapnic respiratory requiring Bipap but subsequently required mechanical intubation due to worsening respiratory failure.  Insulin gtt initiated due to severe hyperglycemia.  With hemorrhagic shock due to bleeding duodenal ulcer, underwent emergent EGD with successful hemostasis by injection of Epinephrine and hemostatic clip. 12/27: Hgb slowly trending down, no report of bleeding from OG suction nor melena. Weaning down vasopressors, if able to continue to wean vasopressors, then can consider WUA/SBT.  Giving temporizing measures for Hyperkalemia.   Interim History / Subjective:  -Yesterday evening, CTa Abdomen with acute GI bleed in duodenum ~ underwent emergent EGD with successful hemostasis by injection of Epinephrine and hemostatic clip. -This morning Hgb remains stable at 9.7 from 10 ~ later this afternoon Hgb slowly trending down, no report of bloody emesis from OG suction nor melena ~ if continues to trend down will need to consult Vascular/IR for possible embolization -Remains on Levophed and Vasopressin ~weaning as able -If able to  to continue weaning pressors, then can consider WUA/SBT ~ pt placed in SBT, with some accessory muscle use and diffuse wheezing and coarse on auscultation ~ will exercise in PSV as tolerated but will hold off on extubation -Temporizing measures given for Hyperkalemia ~ repeat K at 4.8, Creatinine stable at 1.19 from 1.2  Objective   Blood pressure 123/70, pulse 80, temperature 99 F (37.2 C), temperature source Oral, resp. rate 14, height 5' (1.524 m), weight 66.5 kg, last menstrual period 12/24/1998, SpO2 100%.    Vent Mode: PRVC FiO2 (%):  [30 %-100 %] 30 % Set Rate:  [14 bmp] 14 bmp Vt Set:  [450 mL] 450 mL PEEP:  [8 cmH20]  8 cmH20 Pressure Support:  [10 cmH20] 10 cmH20 Plateau Pressure:  [17 cmH20-21 cmH20] 17 cmH20   Intake/Output Summary (Last 24 hours) at 10/29/2023 0748 Last data filed at 10/29/2023 9629 Gross per 24 hour  Intake 2721.25 ml  Output 1025 ml  Net 1696.25 ml   Filed Weights   10/05/23 0912 10/20/23 0732 10/28/23 1431  Weight: 74.1 kg 74.1 kg 66.5 kg   Examination: General: Acute on chronically-ill appearing female, NAD, sedated and mechanically intubated  HENT: Supple, no JVD  Lungs: Coarse breath sounds with expiratory wheezing throughout , even, non labored, occasionally overbreathing the vent Cardiovascular: Sinus tachycardia, s1s2, no r/g, 2+ radial/1+ distal pulses, no edema  Abdomen: Hypoactive BS x4, obese, soft, non distended  Extremities: Right-sided hemiplegia  Neuro: Sedated, withdraws from pain but not following commands, PERRL GU: Indwelling foley catheter draining yellow urine  Resolved Hospital Problem list     Assessment & Plan:   #Acute metabolic encephalopathy #Acute CVA with right-sided hemiplegia: MRI Brain 10/12/22 showed bilateral frontoparietal infarcts almost in a watershed pattern  #Sedation needs in setting of Mechanical ventilation discomfort/pain  Hx: Anxiety and panic attacks  -Maintain a RASS goal of 0 to -1 -Precedex and Fentanyl as needed to maintain RASS goal -Avoid sedating medications as able -Daily wake up assessment -Ammonia level pending  #Acute on chronic hypercapnic respiratory failure  #AECOPD  #Metapneumovirus #Pneumonia #Lung nodule s/p bronchoscopy 10/20/23 #Mechanical ventilation  - Full vent support for now: vent settings reviewed and established  - Continue lung protective strategies  - Maintain plateau pressures less than 30 cm H2O - SBT once all parameters met  - VAP bundle implemented  - Intermittent CXR and ABG's - Scheduled and prn bronchodilator therapy  - IV steroids, wean as able  - RUL lung biopsy results  12/18: revealed very rare atypical cells, favor reactive, fragments of alveolated lung and blood clot - ABX as above   #Hypotension multifactorial: Hemorrhagic shock superimposed on sedating medications  #Acute on chronic diastolic CHF  Hx: HTN and HLD  Echo 09/24/2023: EF 55 to 60%; trivial tricuspid valve regurgitation  -Continuous cardiac monitoring -Maintain MAP >65 -Cautious IV fluids -Transfusions as indicated -Vasopressors as needed to maintain MAP goal -Trend lactic acid until normalized -Trend HS Troponin until peaked -Echocardiogram pending -Diuresis as BP and renal function permits ~ holding now due to vasopressors -Hold outpatient antihypertensives for now  #Acute kidney injury secondary ATN  #Hyponatremia  #Hypochloridemia  #Hyperkalemia  #Lactic acidosis  -Monitor I&O's / urinary output -Follow BMP -Ensure adequate renal perfusion -Avoid nephrotoxic agents as able -Replace electrolytes as indicated ~ Pharmacy following for assistance with electrolyte replacement -Give temporizing measures 12/27: Calcium, insulin, and Lokelma ~ follow up BMP later this afternoon  #Metapneumovirus  #Strep agalactiae UTI: completed course of  iv ceftriaxone  #Pneumonia: completed 5 day course of iv ceftriaxone and azithromycin  -Monitor fever curve -Trend WBC's & Procalcitonin -Follow cultures as above -Continue empiric Cefepime and Flagyl pending cultures & sensitivities  #Acute Blood Loss Anemia due to Acute Upper GI Bleed from bleeding Duodenal Ulcer ~ s/p EGD on 12/26 with successful hemostatis by means of Epinephrine injection and hemostatic clip #Thrombocytosis  -Monitor for S/Sx of bleeding -Trend CBC -SCD's for VTE Prophylaxis  -Transfuse for Hgb <8 -Continue PPI BID -Gastroenterology following, appreciate input  -If has further bleeding, then will need to consult IR/Vascular Surgery for embolization vs General Surgery for Laparotomy   #Hypothyroidism - Continue  synthroid   #Type II diabetes mellitus  - CBG's per glucose stabilizer  - Insulin gtt - BMP q4hrs and beta-hydroxy q8hrs while on insulin gtt  - Diabetes coordinator consulted appreciate input    Best Practice (right click and "Reselect all SmartList Selections" daily)   Diet/type: NPO DVT prophylaxis SCD (no chemical ppx due to Acute GI bleed) Pressure ulcer(s): N/A GI prophylaxis: PPI Lines: Central line, and is still needed Foley:  Yes, and it is still needed Code Status:  full code Last date of multidisciplinary goals of care discussion [12/27]  12/27: Will update pt's family when they arrive at bedside.  Labs   CBC: Recent Labs  Lab 10/24/23 0614 10/26/23 0313 10/28/23 1450 10/28/23 1532 10/29/23 0023 10/29/23 0605  WBC 6.9 4.2 17.6* 18.6* 24.3* 23.5*  NEUTROABS 6.1 3.8 14.0* 14.9*  --   --   HGB 9.6* 9.1* 6.5* 6.4* 10.0* 9.7*  HCT 29.3* 27.6* 19.9* 19.1* 28.7* 27.8*  MCV 88.8 87.1 89.6 88.4 90.8 90.8  PLT 203 200 595* 600* 452* 453*    Basic Metabolic Panel: Recent Labs  Lab 10/24/23 0614 10/25/23 0415 10/26/23 0313 10/28/23 1450 10/28/23 2006 10/29/23 0023 10/29/23 0605  NA 132* 135 133* 129* 135 133*  --   K 4.1 4.0 4.4 5.4* 3.3* 4.5  --   CL 87* 85* 85* 83* 92* 93*  --   CO2 34* 34* 36* 35* 28 31  --   GLUCOSE 235* 168* 222* 536* 329* 121*  --   BUN 23 27* 31* 84* 77* 70*  --   CREATININE 0.77 1.02* 0.85 1.23* 1.19* 1.02*  --   CALCIUM 8.9 8.9 8.9 8.9 7.8* 7.8*  --   MG 2.0 2.1 2.1  --   --   --  1.5*  PHOS 3.6  --   --   --   --   --  2.2*   GFR: Estimated Creatinine Clearance: 44.3 mL/min (A) (by C-G formula based on SCr of 1.02 mg/dL (H)). Recent Labs  Lab 10/28/23 1450 10/28/23 1532 10/28/23 1804 10/28/23 2006 10/29/23 0023 10/29/23 0605  PROCALCITON 0.16  --   --   --   --  0.57  WBC 17.6* 18.6*  --   --  24.3* 23.5*  LATICACIDVEN 3.2*  --  5.2* 4.8*  --   --     Liver Function Tests: Recent Labs  Lab 10/24/23 0614  10/28/23 1450  AST 22 30  ALT 20 27  ALKPHOS 72 71  BILITOT 0.7 0.6  PROT 6.0* 5.5*  ALBUMIN 2.8* 2.7*   No results for input(s): "LIPASE", "AMYLASE" in the last 168 hours. Recent Labs  Lab 10/28/23 1804  AMMONIA 64*    ABG    Component Value Date/Time   PHART 7.33 (L) 10/28/2023 1558   PCO2ART  69 (HH) 10/28/2023 1558   PO2ART 406 (H) 10/28/2023 1558   HCO3 36.4 (H) 10/28/2023 1558   O2SAT 99.9 10/28/2023 1558     Coagulation Profile: Recent Labs  Lab 10/28/23 1804  INR 1.2    Cardiac Enzymes: No results for input(s): "CKTOTAL", "CKMB", "CKMBINDEX", "TROPONINI" in the last 168 hours.  HbA1C: Hgb A1c MFr Bld  Date/Time Value Ref Range Status  10/15/2023 04:48 AM 8.4 (H) 4.8 - 5.6 % Final    Comment:    (NOTE) Pre diabetes:          5.7%-6.4%  Diabetes:              >6.4%  Glycemic control for   <7.0% adults with diabetes   06/07/2023 02:19 PM 7.1 (H) 4.8 - 5.6 % Final    Comment:    (NOTE) Pre diabetes:          5.7%-6.4%  Diabetes:              >6.4%  Glycemic control for   <7.0% adults with diabetes     CBG: Recent Labs  Lab 10/28/23 2314 10/29/23 0022 10/29/23 0125 10/29/23 0412 10/29/23 0737  GLUCAP 120* 109* 121* 173* 275*    Review of Systems:   Unable to assess pt mechanically intubated  Past Medical History:  She,  has a past medical history of Acid reflux, COPD (chronic obstructive pulmonary disease) (HCC), Diabetes mellitus without complication (HCC), Hyperlipidemia, Hypertension, and Thyroid disease.   Surgical History:   Past Surgical History:  Procedure Laterality Date   appendectomy     APPENDECTOMY     BREAST BIOPSY Right    CORE W/CLIP - NEG   TOTAL VAGINAL HYSTERECTOMY     TUMOR REMOVAL     benign;stomach   VIDEO BRONCHOSCOPY WITH ENDOBRONCHIAL NAVIGATION Right 10/20/2023   Procedure: VIDEO BRONCHOSCOPY WITH ENDOBRONCHIAL NAVIGATION;  Surgeon: Vida Rigger, MD;  Location: [facility] ORS;  Service: Thoracic;   Laterality: Right;     Social History:   reports that she quit smoking about 37 years ago. Her smoking use included cigarettes. She has never used smokeless tobacco. She reports that she does not drink alcohol and does not use drugs.   Family History:  Her family history includes Breast cancer in her maternal aunt and maternal grandmother; Breast cancer (age of onset: 72) in her sister; Heart attack in her mother; Hypertension in her mother.   Allergies Allergies  Allergen Reactions   Penicillins Hives   Aspirin Other (See Comments)    Lips numb   Sulfa Antibiotics      Home Medications  Prior to Admission medications   Medication Sig Start Date End Date Taking? Authorizing Provider  aspirin EC 81 MG tablet Take 81 mg by mouth daily.   Yes [provider]  atorvastatin (LIPITOR) 20 MG tablet Take 20 mg by mouth daily. 06/25/23  Yes [provider]  cetirizine (ZYRTEC) 10 MG tablet Take 10 mg by mouth daily.   Yes [provider]  COMBIVENT RESPIMAT 20-100 MCG/ACT AERS respimat Inhale into the lungs every 6 (six) hours as needed for wheezing or shortness of breath. 09/06/23  Yes [provider]  dextromethorphan-guaiFENesin (MUCINEX DM) 30-600 MG 12hr tablet Take 1 tablet by mouth 2 (two) times daily as needed for cough. 06/08/23  Yes Arnetha Courser, MD  diclofenac sodium (VOLTAREN) 1 % GEL Apply topically 4 (four) times daily.   Yes [provider]  DULERA 200-5 MCG/ACT AERO Inhale  2 puffs into the lungs every 12 (twelve) hours. 09/06/23  Yes [provider]  furosemide (LASIX) 20 MG tablet Take 1 tablet (20 mg total) by mouth daily. 09/27/23  Yes Hall, Carole N, DO  glipiZIDE (GLUCOTROL) 10 MG tablet Take 10 mg by mouth daily before breakfast.   Yes [provider]  ipratropium (ATROVENT HFA) 17 MCG/ACT inhaler Inhale 2 puffs into the lungs every 6 (six) hours.   Yes [provider]  levothyroxine (SYNTHROID,  LEVOTHROID) 100 MCG tablet Take 100 mcg by mouth daily before breakfast.   Yes [provider]  lisinopril-hydrochlorothiazide (PRINZIDE,ZESTORETIC) 20-25 MG per tablet Take 1 tablet by mouth daily.   Yes [provider]  Multiple Vitamin (MULTIVITAMIN) capsule Take 1 capsule by mouth daily.   Yes [provider]  predniSONE (STERAPRED UNI-PAK 21 TAB) 10 MG (21) TBPK tablet As directed on packaging 09/23/23  Yes Bradler, Clent Jacks, MD  pregabalin (LYRICA) 50 MG capsule Take 50 mg by mouth 2 (two) times daily.   Yes [provider]  sertraline (ZOLOFT) 100 MG tablet Take 100 mg by mouth daily. 06/28/23  Yes [provider]  TRADJENTA 5 MG TABS tablet Take 5 mg by mouth daily. 07/26/23  Yes [provider]     Critical care time: 40 minutes      Harlon Ditty, AGACNP-BC [facility] Prefer epic messenger for cross cover needs If after hours, please call E-link

## 2023-10-29 NOTE — Progress Notes (Signed)
Pt self extubated at around 13:30.  Initially she was awake, able to speak, cough, and follow commands.  SpO2 100% on nasal cannula, however noted to have stridor.  Was given 10 mg Decadron, Racemic Epi, continuous albuterol 7.5 mg, and placed on BiPAP.  Despite the above interventions, her work of breathing increased, becoming more lethargic.  Discussed with Dr. Larinda Buttery at bedside, will re-intubate and place back on mechanical ventilation.     Harlon Ditty, AGACNP-BC [facility] Prefer epic messenger for cross cover needs If after hours, please call E-link

## 2023-10-29 NOTE — Inpatient Diabetes Management (Signed)
Inpatient Diabetes Program Recommendations  AACE/ADA: New Consensus Statement on Inpatient Glycemic Control   Target Ranges:  Prepandial:   less than 140 mg/dL      Peak postprandial:   less than 180 mg/dL (1-2 hours)      Critically ill patients:  140 - 180 mg/dL    Latest Reference Range & Units 10/29/23 00:22 10/29/23 01:25 10/29/23 04:12 10/29/23 07:37  Glucose-Capillary 70 - 99 mg/dL 161 (H) 096 (H) 045 (H) 275 (H)    Latest Reference Range & Units 10/28/23 15:10 10/28/23 15:48 10/28/23 16:40 10/28/23 17:58 10/28/23 19:01 10/28/23 20:00 10/28/23 20:52 10/28/23 21:47 10/28/23 23:14  Glucose-Capillary 70 - 99 mg/dL 409 (H) 811 (H) 914 (H) 361 (H) 335 (H) 320 (H) 245 (H) 185 (H) 120 (H)    Latest Reference Range & Units 10/28/23 07:55 10/28/23 12:47 10/28/23 14:32  Glucose-Capillary 70 - 99 mg/dL 782 (H) 956 (H) 213 (HH)   Review of Glycemic Control  Diabetes history: DM2 Outpatient Diabetes medications: Glipizide 10 mg daily, Tradjenta 5 mg daily Current orders for Inpatient glycemic control: Novolog 0-20 units Q4H; Solumedrol 40 mg Q24H  Inpatient Diabetes Program Recommendations:    Insulin: Please consider ordering Semglee 25 units Q24H (starting now).   NOTE: Patient received Semglee 18 units on 10/27/23 at 21:07. Glucose up to 514 mg/dl at 08:65 on 78/46/96 and patient was started on IV insulin at 15:25 on 10/28/23. Per chart, IV insulin was stopped around 2:30 am today and no basal insulin was given. Most current CBG 275 mg/dl at 2:95 am today.   Thanks, Orlando Penner, RN, MSN, CDCES Diabetes Coordinator Inpatient Diabetes Program [phone] (Team Pager from 8am to 5pm)

## 2023-10-29 NOTE — Progress Notes (Signed)
1133- All sedation paused for SAT.   1230- Pt placed on SBT by RT.  1300- Dr Larinda Buttery and Leanord Asal, NP at bedside for pt evaluation. Pt able to follow basic commands. Pt unable to maintain eye contact and appears to have slight upward gaze. Pt showing signs of labored breathing, but is maintaining adequate oxygenation. Bilateral breath sounds coarse throughout, with expiratory wheezing. Plan to continue with SBT.  1320- Pt self extubation. RT, Dr. Larinda Buttery, and Leanord Asal, NP to bedside. Pt placed on 3L [place] after utilizing ambubag for oxygenation. Pt able to follow commands, but showing signs of fatigue. Eyes continue to have slight upward gaze. Lung sounds coarse bilaterally, with expiratory wheezing. Respirations labored.   1330- Pt having inspiratory and expiratory wheezing, with increasing work of breath. Orders placed for 10mg  Decadron and Racemic Epi. Pt placed on Bipap by RT and Leanord Asal, NP at bedside.  1345- Plan for re intubation per Dr. Larinda Buttery.  1354- Pt intubated with a 7.5 ETT, 21 at the lip by Leanord Asal, NP with Dr Larinda Buttery and RT at bedside. Breath sounds heard bilaterally.

## 2023-10-29 NOTE — Consult Note (Signed)
PHARMACY CONSULT NOTE - ELECTROLYTES  Pharmacy Consult for Electrolyte Monitoring and Replacement   Recent Labs: Height: 5' (152.4 cm) Weight: 66.5 kg (146 lb 9.7 oz) IBW/kg (Calculated) : 45.5 Estimated Creatinine Clearance: 38 mL/min (A) (by C-G formula based on SCr of 1.19 mg/dL (H)). Potassium (mmol/L)  Date Value  10/29/2023 4.8  09/30/2013 3.7   Magnesium (mg/dL)  Date Value  60/63/0160 1.5 (L)   Calcium (mg/dL)  Date Value  10/93/2355 8.5 (L)   Calcium, Total (mg/dL)  Date Value  73/22/0254 9.2   Albumin (g/dL)  Date Value  27/04/2375 2.7 (L)  09/30/2013 3.7   Phosphorus (mg/dL)  Date Value  28/31/5176 2.2 (L)   Sodium (mmol/L)  Date Value  10/29/2023 135  09/30/2013 136    Assessment  Michele House is a 67 y.o. female presenting with shortness of breath. PMH significant for COPD, chronic respiratory failure on 2 L, hypertension, type 2 diabetes, HFpEF. Recent discharge from the hospital 24th November 2024 after hospitalization for COPD exacerbation complicated by respiratory failure. She presented to the hospital again on 10/02/2023 with chest pain, malaise, cough, shortness of breath, abdominal pain and dysuria. Pharmacy has been consulted to monitor and replace electrolytes.  Diet: NPO, intubated MIVF: N/A Pertinent medications: insulin gtt  Goal of Therapy: Electrolytes WNL  Plan:  Phos 2.2: NaPhos 21 mmol IV x 1 Continuous insulin infusion stopped as DKA resolved Will check all electrolytes with AM labs  Thank you for allowing pharmacy to be a part of this patient's care.  Bettey Costa, PharmD Clinical Pharmacist 10/29/2023 1:07 PM

## 2023-10-30 ENCOUNTER — Inpatient Hospital Stay: Payer: 59 | Admitting: Radiology

## 2023-10-30 DIAGNOSIS — G9341 Metabolic encephalopathy: Secondary | ICD-10-CM | POA: Diagnosis not present

## 2023-10-30 HISTORY — PX: IR EMBO ART  VEN HEMORR LYMPH EXTRAV  INC GUIDE ROADMAPPING: IMG5450

## 2023-10-30 LAB — BPAM PLATELET PHERESIS
Blood Product Expiration Date: 202412292359
Blood Product Expiration Date: 202412292359
ISSUE DATE / TIME: 202412272047
ISSUE DATE / TIME: 202412272214
Unit Type and Rh: 5100
Unit Type and Rh: 5100

## 2023-10-30 LAB — HEMOGLOBIN AND HEMATOCRIT, BLOOD
HCT: 24.6 % — ABNORMAL LOW (ref 36.0–46.0)
HCT: 23.5 % — ABNORMAL LOW (ref 36.0–46.0)
HCT: 24.4 % — ABNORMAL LOW (ref 36.0–46.0)
Hemoglobin: 8.3 g/dL — ABNORMAL LOW (ref 12.0–15.0)
Hemoglobin: 7.8 g/dL — ABNORMAL LOW (ref 12.0–15.0)
Hemoglobin: 8.1 g/dL — ABNORMAL LOW (ref 12.0–15.0)

## 2023-10-30 LAB — LUPUS ANTICOAGULANT PANEL
DRVVT: 30.9 s (ref 0.0–47.0)
PTT Lupus Anticoagulant: 27.8 s (ref 0.0–43.5)

## 2023-10-30 LAB — MAGNESIUM: Magnesium: 2.1 mg/dL (ref 1.7–2.4)

## 2023-10-30 LAB — BASIC METABOLIC PANEL
Anion gap: 8 (ref 5–15)
BUN: 47 mg/dL — ABNORMAL HIGH (ref 8–23)
CO2: 29 mmol/L (ref 22–32)
Calcium: 8.2 mg/dL — ABNORMAL LOW (ref 8.9–10.3)
Chloride: 101 mmol/L (ref 98–111)
Creatinine, Ser: 0.86 mg/dL (ref 0.44–1.00)
GFR, Estimated: >60 mL/min (ref >60)
Glucose, Bld: 115 mg/dL — ABNORMAL HIGH (ref 70–99)
Potassium: 3.9 mmol/L (ref 3.5–5.1)
Sodium: 138 mmol/L (ref 135–145)

## 2023-10-30 LAB — CBC
HCT: 25.8 % — ABNORMAL LOW (ref 36.0–46.0)
HCT: 25.1 % — ABNORMAL LOW (ref 36.0–46.0)
Hemoglobin: 8.8 g/dL — ABNORMAL LOW (ref 12.0–15.0)
Hemoglobin: 8.6 g/dL — ABNORMAL LOW (ref 12.0–15.0)
MCH: 30.2 pg (ref 26.0–34.0)
MCH: 30.5 pg (ref 26.0–34.0)
MCHC: 34.1 g/dL (ref 30.0–36.0)
MCHC: 34.3 g/dL (ref 30.0–36.0)
MCV: 88.7 fL (ref 80.0–100.0)
MCV: 89 fL (ref 80.0–100.0)
Platelets: 382 10*3/uL (ref 150–400)
Platelets: 368 10*3/uL (ref 150–400)
RBC: 2.91 MIL/uL — ABNORMAL LOW (ref 3.87–5.11)
RBC: 2.82 MIL/uL — ABNORMAL LOW (ref 3.87–5.11)
RDW: 17.3 % — ABNORMAL HIGH (ref 11.5–15.5)
RDW: 18.2 % — ABNORMAL HIGH (ref 11.5–15.5)
WBC: 17.2 10*3/uL — ABNORMAL HIGH (ref 4.0–10.5)
WBC: 13.8 10*3/uL — ABNORMAL HIGH (ref 4.0–10.5)
nRBC: 0.7 % — ABNORMAL HIGH (ref 0.0–0.2)
nRBC: 0.8 % — ABNORMAL HIGH (ref 0.0–0.2)

## 2023-10-30 LAB — PREPARE PLATELET PHERESIS
Unit division: 0
Unit division: 0

## 2023-10-30 LAB — GLUCOSE, CAPILLARY
Glucose-Capillary: 128 mg/dL — ABNORMAL HIGH (ref 70–99)
Glucose-Capillary: 85 mg/dL (ref 70–99)
Glucose-Capillary: 144 mg/dL — ABNORMAL HIGH (ref 70–99)
Glucose-Capillary: 201 mg/dL — ABNORMAL HIGH (ref 70–99)
Glucose-Capillary: 158 mg/dL — ABNORMAL HIGH (ref 70–99)
Glucose-Capillary: 135 mg/dL — ABNORMAL HIGH (ref 70–99)
Glucose-Capillary: 89 mg/dL (ref 70–99)

## 2023-10-30 LAB — LACTIC ACID, PLASMA
Lactic Acid, Venous: 0.9 mmol/L (ref 0.5–1.9)
Lactic Acid, Venous: 0.9 mmol/L (ref 0.5–1.9)

## 2023-10-30 LAB — PHOSPHORUS: Phosphorus: 3 mg/dL (ref 2.5–4.6)

## 2023-10-30 MED ORDER — MIDAZOLAM HCL 2 MG/2ML IJ SOLN
4.0000 mg | Freq: Once | INTRAMUSCULAR | Status: AC
  Administered 2023-10-30 (×2): 2 mg via INTRAVENOUS

## 2023-10-30 MED ORDER — VECURONIUM BROMIDE 10 MG IV SOLR: 10.0000 mg | Freq: Once | INTRAVENOUS | Status: AC

## 2023-10-30 MED ORDER — LIDOCAINE HCL 1 % IJ SOLN
3.0000 mL | Freq: Once | INTRAMUSCULAR | Status: AC
  Administered 2023-10-30: 3 mL via INTRADERMAL

## 2023-10-30 MED ORDER — SERTRALINE HCL 50 MG PO TABS
50.0000 mg | ORAL_TABLET | Freq: Every day | ORAL | Status: DC
  Administered 2023-10-31 – 2023-11-01 (×2): 50 mg
  Filled 2023-10-30 (×3): qty 1

## 2023-10-30 MED ORDER — ACETAMINOPHEN 325 MG PO TABS: 650.0000 mg | ORAL_TABLET | Freq: Four times a day (QID) | ORAL | Status: DC | PRN

## 2023-10-30 MED ORDER — FENTANYL CITRATE (PF) 100 MCG/2ML IJ SOLN
INTRAMUSCULAR | Status: AC
  Filled 2023-10-30: qty 2

## 2023-10-30 MED ORDER — MIDAZOLAM HCL 2 MG/2ML IJ SOLN
INTRAMUSCULAR | Status: AC
  Filled 2023-10-30: qty 2

## 2023-10-30 MED ORDER — FENTANYL CITRATE PF 50 MCG/ML IJ SOSY
50.0000 ug | PREFILLED_SYRINGE | Freq: Once | INTRAMUSCULAR | Status: DC
  Filled 2023-10-30: qty 1

## 2023-10-30 MED ORDER — VECURONIUM BROMIDE 10 MG IV SOLR
INTRAVENOUS | Status: AC
  Administered 2023-10-31: 10 mg via INTRAVENOUS
  Filled 2023-10-30: qty 10

## 2023-10-30 MED ORDER — SENNOSIDES-DOCUSATE SODIUM 8.6-50 MG PO TABS
1.0000 | ORAL_TABLET | Freq: Every day | ORAL | Status: DC
  Administered 2023-10-31 – 2023-11-01 (×2): 1
  Filled 2023-10-30 (×3): qty 1

## 2023-10-30 MED ORDER — IOHEXOL 300 MG/ML  SOLN
55.0000 mL | Freq: Once | INTRAMUSCULAR | Status: AC | PRN
  Administered 2023-10-30: 55 mL via INTRA_ARTERIAL

## 2023-10-30 MED ORDER — MIDAZOLAM HCL 2 MG/2ML IJ SOLN
INTRAMUSCULAR | Status: AC
  Filled 2023-10-30: qty 4

## 2023-10-30 MED ORDER — LIDOCAINE HCL 1 % IJ SOLN
INTRAMUSCULAR | Status: AC
Start: 2023-10-30 — End: ?
  Filled 2023-10-30: qty 20

## 2023-10-30 MED ORDER — VECURONIUM BROMIDE 10 MG IV SOLR
INTRAVENOUS | Status: AC
  Administered 2023-10-30: 10 mg via INTRAVENOUS
  Filled 2023-10-30: qty 10

## 2023-10-30 MED ORDER — FENTANYL CITRATE PF 50 MCG/ML IJ SOSY
PREFILLED_SYRINGE | INTRAMUSCULAR | Status: AC
  Filled 2023-10-30: qty 1

## 2023-10-30 MED ORDER — STERILE WATER FOR INJECTION IJ SOLN
INTRAMUSCULAR | Status: AC
  Administered 2023-10-31: 10 mL
  Filled 2023-10-30: qty 10

## 2023-10-30 MED ORDER — POLYETHYLENE GLYCOL 3350 17 G PO PACK
17.0000 g | PACK | Freq: Every day | ORAL | Status: DC
  Administered 2023-10-31 – 2023-11-01 (×2): 17 g
  Filled 2023-10-30 (×3): qty 1

## 2023-10-30 MED ORDER — INSULIN GLARGINE-YFGN 100 UNIT/ML ~~LOC~~ SOLN
18.0000 [IU] | Freq: Every day | SUBCUTANEOUS | Status: DC
  Administered 2023-10-31: 18 [IU] via SUBCUTANEOUS
  Filled 2023-10-30 (×2): qty 0.18

## 2023-10-30 MED ORDER — HYDROXYZINE HCL 25 MG PO TABS: 25.0000 mg | ORAL_TABLET | Freq: Three times a day (TID) | ORAL | Status: DC | PRN

## 2023-10-30 NOTE — Progress Notes (Signed)
NAME:  Michele House, MRN:  [id number], DOB:  04-01-1956, LOS: 28 ADMISSION DATE:  10/02/2023, CONSULTATION DATE: 10/28/2023 REFERRING MD: Dr. Myriam Forehand, CHIEF COMPLAINT: Shortness of Breath   History of Present Illness:  Michele House is a 67 y.o. female with medical history significant of COPD, chronic respiratory failure on 2 L, hypertension, type 2 diabetes, HFpEF, recent discharge from the hospital 24th November 2024 after hospitalization for COPD exacerbation complicated by respiratory failure.  She presented to the hospital again on 10/02/2023 with chest pain, malaise, cough, shortness of breath, abdominal pain and dysuria.   She was admitted to the hospital for COPD exacerbation, acute on chronic hypoxemic respiratory failure, UTI and pneumonia.   She became more confused in the hospital and MRI brain revealed acute stroke.  Please see "Significant Hospital Events" section below for full detailed hospital course.    Pertinent  Medical History  Acid Reflux  COPD Type II Diabetes Mellitus  HLD HTN Thyroid Disease    Micro Data:  11/30: Strep pneumo & Legionella urinary antigens>> negative 11/30: Urine>>>=100,000 COLONIES/mL GROUP B STREP(S.AGALACTIAE) 11/30: Blood cultures x2>> no growth 12/6: CSF>> no growth 12/18: BAL>> normal respiratory flora 12/23: RVP>> + Metapneumovirus 12/23: COVID-19 PCR>>negative 12/26: Blood culture x2>> no growth to date 12/26: MRSA PCR>>negative 12/26: Tracheal aspirate>>  Antimicrobials:   Anti-infectives (From admission, onward)    Start     Dose/Rate Route Frequency Ordered Stop   10/30/23 2200  Vancomycin (VANCOCIN) 1,500 mg in sodium chloride 0.9 % 500 mL IVPB  Status:  Discontinued        1,500 mg 250 mL/hr over 120 Minutes Intravenous Every 48 hours 10/28/23 1956 10/29/23 0741   10/29/23 1400  metroNIDAZOLE (FLAGYL) IVPB 500 mg        500 mg 100 mL/hr over 60 Minutes Intravenous Every 12 hours 10/29/23 1304     10/28/23  2200  ceFEPIme (MAXIPIME) 2 g in sodium chloride 0.9 % 100 mL IVPB        2 g 200 mL/hr over 30 Minutes Intravenous Every 12 hours 10/28/23 2003     10/28/23 2045  vancomycin (VANCOCIN) 1,500 mg in sodium chloride 0.9 % 500 mL IVPB        1,500 mg 257.5 mL/hr over 120 Minutes Intravenous  Once 10/28/23 1954 10/28/23 2050   10/04/23 1000  azithromycin (ZITHROMAX) tablet 500 mg        500 mg Oral Daily 10/03/23 0751 10/06/23 0856   10/03/23 0600  cefTRIAXone (ROCEPHIN) 2 g in sodium chloride 0.9 % 100 mL IVPB  Status:  Discontinued        2 g 200 mL/hr over 30 Minutes Intravenous Every 24 hours 10/02/23 0740 10/07/23 0854   10/02/23 0745  cefTRIAXone (ROCEPHIN) 2 g in sodium chloride 0.9 % 100 mL IVPB  Status:  Discontinued        2 g 200 mL/hr over 30 Minutes Intravenous Every 24 hours 10/02/23 0738 10/02/23 0740   10/02/23 0745  azithromycin (ZITHROMAX) 500 mg in sodium chloride 0.9 % 250 mL IVPB        500 mg 250 mL/hr over 60 Minutes Intravenous Every 24 hours 10/02/23 0738 10/03/23 1236   10/02/23 0630  cefTRIAXone (ROCEPHIN) 2 g in sodium chloride 0.9 % 100 mL IVPB        2 g 200 mL/hr over 30 Minutes Intravenous  Once 10/02/23 0617 10/02/23 0714      Significant Hospital Events: Including procedures, antibiotic start and  stop dates in addition to other pertinent events   12/01: Vital stable, respiratory viral panel negative, procalcitonin negative, preliminary blood cultures negative.  Urine cultures are ordered as add-on, ammonia levels mildly elevated at 39, strep pneumo negative, CBG elevated at 244, history of enlarging pulmonary nodule with recommendations for PET/CT during most recent admission which has not been done yet, if video bronchoscopy was scheduled for 10/13/2023 as outpatient. 12/02: Unable to take care of herself and son cannot provide the required care as she required 24-hour supervision due to worsening dementia.  PT is recommending SNF. 12/03: Blood pressure  started trending up, restarting home Zestoretic.  Urine cultures with strep agalactiae, penicillin allergy noted, will continue with ceftriaxone for now.  Also started on Remeron for concern of worsening depression and unable to sleep at night  12/05:  Patient has some blurry vision and weakness and pain.  MRI of the brain negative for acute intracranial abnormality. 12/08:  Patient more confused today than yesterday.  Able to follow some simple commands but not as good of a conversation today as yesterday. 12/09:  Patient still confused today.  Does not follow simple commands today. 12/10: Patient more talkative today but was looking up at the ceiling when she was talking with me.  She does not move her extremities to command but does move them on her own.  Physical therapy and Occupational Therapy did not see her move her right arm. 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  Mild associated petechial hemorrhage.  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 12/12: Patient with new stroke, CTA head and neck with no large vessel occlusion.  Patient had a recent echocardiogram which was normal. Concern of paraneoplastic versus hypercoagulability secondary to malignancy.  Patient missed her appointment for bronchoscopy and outpatient appointment for PET scan due to recurrent hospitalizations. 12/13: Patient continued to have waxing and waning mental status, unable to move right upper and lower extremities.  Having some hallucination.  Pulmonary is planning lung biopsy on 12/27 if she remains stable.  Rapidly progressive dementia and now with underlying stroke, question of paraneoplastic encephalitis and hypercoagulability secondary to underlying undiagnosed malignancy. 12/15: Patient with much improved mentation.  Having  some flickering of movements on right upper and lower extremity today, left extremities weaker but seems improving.   12/18: Pt underwent bronchoscopy and lung biopsy 12/19: patient stable , she is being optimized for dc for rehab.  She's cleared from pulmonary for discharge. Results from bronch will take 1 week.   12/26: Pt transferred to the stepdown unit with acute hypercapnic respiratory requiring Bipap but subsequently required mechanical intubation due to worsening respiratory failure.  Insulin gtt initiated due to severe hyperglycemia.  With hemorrhagic shock due to bleeding duodenal ulcer, underwent emergent EGD with successful hemostasis by injection of Epinephrine and hemostatic clip. 12/27: Hgb slowly trending down, no report of bleeding from OG suction nor melena. Weaning down vasopressors, if able to continue to wean vasopressors, then can consider WUA/SBT.  Giving temporizing measures for Hyperkalemia. Self extubated but required reintubation. Hgb continued to trend down, given 1 unit pRBC's and 2 FFP.  IR consulted for consideration for embolization. 12/28: Weaned off Vasopressin, Levophed weaning down.  IR considering embolization if Hgb trends down today.  Peak pressures and lung sounds improved with Ketamine, will keep  intubated today given rapid reintubation yesterday.  Interim History / Subjective:  -IR consulted overnight due to downtrending hgb, due to staffing, decision made to transfuse and monitor and possible embolization today if Hgb again trends down and shows active bleeding -Afebrile, weaning down vasopressors -On minimal vent settings, peak pressures and lungs sounds improved on Ketamine ~ will keep intubated today due to rapid reintubation yesterday  Objective   Blood pressure (!) 117/59, pulse 74, temperature 98.4 F (36.9 C), temperature source Oral, resp. rate 16, height 5' (1.524 m), weight 66.5 kg, last menstrual period 12/24/1998, SpO2 98%.    Vent Mode:  PRVC FiO2 (%):  [30 %] 30 % Set Rate:  [14 bmp] 14 bmp Vt Set:  [450 mL] 450 mL PEEP:  [5 cmH20-8 cmH20] 5 cmH20 Pressure Support:  [5 cmH20] 5 cmH20 Plateau Pressure:  [18 cmH20-25 cmH20] 20 cmH20   Intake/Output Summary (Last 24 hours) at 10/30/2023 1610 Last data filed at 10/30/2023 9604 Gross per 24 hour  Intake 2153.43 ml  Output 1085 ml  Net 1068.43 ml   Filed Weights   10/05/23 0912 10/20/23 0732 10/28/23 1431  Weight: 74.1 kg 74.1 kg 66.5 kg   Examination: General: Acute on chronically-ill appearing female, NAD, lightly sedated and mechanically intubated  HENT: Supple, no JVD  Lungs: Clear breath sounds with expiratory wheezing throughout , even, non labored, occasionally overbreathing the vent Cardiovascular: Sinus tachycardia, s1s2, no r/g, 2+ radial/1+ distal pulses, no edema  Abdomen: Hypoactive BS x4, obese, soft, non distended  Extremities: Right-sided hemiplegia  Neuro: Lightly sedated, arouses to voice and moves left extremities (noted to previously have right hemiparesis), PERRL GU: Indwelling foley catheter draining yellow urine  Resolved Hospital Problem list     Assessment & Plan:   #Acute metabolic encephalopathy #Acute CVA with right-sided hemiplegia: MRI Brain 10/12/22 showed bilateral frontoparietal infarcts almost in a watershed pattern  #Sedation needs in setting of Mechanical ventilation discomfort/pain  Hx: Anxiety and panic attacks  -Maintain a RASS goal of 0 to -1 -Precedex and Fentanyl as needed to maintain RASS goal -Avoid sedating medications as able -Daily wake up assessment  #Acute on chronic hypercapnic respiratory failure  #AECOPD  #Metapneumovirus #Pneumonia #Lung nodule s/p bronchoscopy 10/20/23 #Mechanical ventilation  - Full vent support for now: vent settings reviewed and established  - Continue lung protective strategies  - Maintain plateau pressures less than 30 cm H2O - SBT once all parameters met  - VAP bundle  implemented  - Intermittent CXR and ABG's - Scheduled and prn bronchodilator therapy  - IV steroids, wean as able  - RUL lung biopsy results 12/18: revealed very rare atypical cells, favor reactive, fragments of alveolated lung and blood clot - ABX as above  - Continue Ketamine gtt  #Hypotension multifactorial: Hemorrhagic shock superimposed on sedating medications  #Acute on chronic diastolic CHF  Hx: HTN and HLD  Echo 09/24/2023: EF 55 to 60%; trivial tricuspid valve regurgitation  -Continuous cardiac monitoring -Maintain MAP >65 -Cautious IV fluids -Transfusions as indicated -Vasopressors as needed to maintain MAP goal -Trend lactic acid until normalized -Trend HS Troponin until peaked -Echocardiogram pending -Diuresis as BP and renal function permits ~ holding now due to vasopressors -Hold outpatient antihypertensives for now  #Acute kidney injury secondary ATN ~ RESOLVED #Hyponatremia ~ RESOLVED #Hypochloridemia  ~ RESOLVED #Hyperkalemia ~ RESOLVED #Lactic acidosis ~ RESOLVED -Monitor I&O's / urinary output -Follow BMP -Ensure adequate renal perfusion -Avoid nephrotoxic agents as able -Replace electrolytes as indicated ~ Pharmacy following  for assistance with electrolyte replacement  #Metapneumovirus  #Strep agalactiae UTI: completed course of iv ceftriaxone  #Pneumonia: completed 5 day course of iv ceftriaxone and azithromycin  -Monitor fever curve -Trend WBC's & Procalcitonin -Follow cultures as above -Continue empiric Cefepime and Flagyl pending cultures & sensitivities  #Acute Blood Loss Anemia due to Acute Upper GI Bleed from bleeding Duodenal Ulcer ~ s/p EGD on 12/26 with successful hemostatis by means of Epinephrine injection and hemostatic clip #Thrombocytosis  -Monitor for S/Sx of bleeding -Trend CBC -SCD's for VTE Prophylaxis  -Transfuse for Hgb <8 -Continue PPI BID -Gastroenterology following, appreciate input  -If has further bleeding, then will need  to consult IR/Vascular Surgery for embolization vs General Surgery for Laparotomy ~ IR consulted, appreciate input  #Hypothyroidism - Continue synthroid   #Type II diabetes mellitus  -CBG's q4h; Target range of 140 to 180 -SSI and Semglee -Follow ICU Hypo/Hyperglycemia protocol    Best Practice (right click and "Reselect all SmartList Selections" daily)   Diet/type: NPO DVT prophylaxis SCD (no chemical ppx due to Acute GI bleed) Pressure ulcer(s): N/A GI prophylaxis: PPI Lines: Central line, and is still needed Foley:  Yes, and it is still needed Code Status:  full code Last date of multidisciplinary goals of care discussion [12/28]  12/28: Will update pt's family when they arrive at bedside.  Labs   CBC: Recent Labs  Lab 10/24/23 0614 10/26/23 0313 10/28/23 1450 10/28/23 1532 10/29/23 0023 10/29/23 0605 10/29/23 1215 10/29/23 1811 10/30/23 0100 10/30/23 0515  WBC 6.9 4.2 17.6* 18.6* 24.3* 23.5* 19.7*  --  17.2* 13.8*  NEUTROABS 6.1 3.8 14.0* 14.9*  --   --   --   --   --   --   HGB 9.6* 9.1* 6.5* 6.4* 10.0* 9.7* 9.1* 8.3* 8.8* 8.6*  HCT 29.3* 27.6* 19.9* 19.1* 28.7* 27.8* 25.9* 23.9* 25.8* 25.1*  MCV 88.8 87.1 89.6 88.4 90.8 90.8 89.3  --  88.7 89.0  PLT 203 200 595* 600* 452* 453* 388  --  382 368    Basic Metabolic Panel: Recent Labs  Lab 10/24/23 0614 10/25/23 0415 10/26/23 0313 10/28/23 1450 10/28/23 2006 10/29/23 0023 10/29/23 0605 10/29/23 0811 10/29/23 1215 10/30/23 0515  NA 132* 135 133*   < > 135 133*  --  134* 135 138  K 4.1 4.0 4.4   < > 3.3* 4.5  --  6.0* 4.8 3.9  CL 87* 85* 85*   < > 92* 93*  --  96* 98 101  CO2 34* 34* 36*   < > 28 31  --  29 28 29   GLUCOSE 235* 168* 222*   < > 329* 121*  --  334* 203* 115*  BUN 23 27* 31*   < > 77* 70*  --  69* 66* 47*  CREATININE 0.77 1.02* 0.85   < > 1.19* 1.02*  --  1.24* 1.19* 0.86  CALCIUM 8.9 8.9 8.9   < > 7.8* 7.8*  --  8.0* 8.5* 8.2*  MG 2.0 2.1 2.1  --   --   --  1.5*  --   --  2.1  PHOS  3.6  --   --   --   --   --  2.2*  --   --  3.0   < > = values in this interval not displayed.   GFR: Estimated Creatinine Clearance: 52.5 mL/min (by C-G formula based on SCr of 0.86 mg/dL). Recent Labs  Lab 10/28/23 1450 10/28/23 1532  10/29/23 0605 10/29/23 0811 10/29/23 1044 10/29/23 1215 10/30/23 0100 10/30/23 0515  PROCALCITON 0.16  --  0.57  --   --   --   --   --   WBC 17.6*   < > 23.5*  --   --  19.7* 17.2* 13.8*  LATICACIDVEN 3.2*   < >  --    < > 2.6* 2.6* 0.9 0.9   < > = values in this interval not displayed.    Liver Function Tests: Recent Labs  Lab 10/24/23 0623 10/28/23 1450  AST 22 30  ALT 20 27  ALKPHOS 72 71  BILITOT 0.7 0.6  PROT 6.0* 5.5*  ALBUMIN 2.8* 2.7*   No results for input(s): "LIPASE", "AMYLASE" in the last 168 hours. Recent Labs  Lab 10/28/23 1804  AMMONIA 64*    ABG    Component Value Date/Time   PHART 7.37 10/29/2023 0814   PCO2ART 53 (H) 10/29/2023 0814   PO2ART 94 10/29/2023 0814   HCO3 30.6 (H) 10/29/2023 0814   O2SAT 99.1 10/29/2023 0814     Coagulation Profile: Recent Labs  Lab 10/28/23 1804  INR 1.2    Cardiac Enzymes: No results for input(s): "CKTOTAL", "CKMB", "CKMBINDEX", "TROPONINI" in the last 168 hours.  HbA1C: Hgb A1c MFr Bld  Date/Time Value Ref Range Status  10/15/2023 04:48 AM 8.4 (H) 4.8 - 5.6 % Final    Comment:    (NOTE) Pre diabetes:          5.7%-6.4%  Diabetes:              >6.4%  Glycemic control for   <7.0% adults with diabetes   06/07/2023 02:19 PM 7.1 (H) 4.8 - 5.6 % Final    Comment:    (NOTE) Pre diabetes:          5.7%-6.4%  Diabetes:              >6.4%  Glycemic control for   <7.0% adults with diabetes     CBG: Recent Labs  Lab 10/29/23 1213 10/29/23 1545 10/29/23 1944 10/29/23 2339 10/30/23 0345  GLUCAP 172* 153* 154* 158* 128*    Review of Systems:   Unable to assess pt mechanically intubated  Past Medical History:  She,  has a past medical history of Acid  reflux, COPD (chronic obstructive pulmonary disease) (HCC), Diabetes mellitus without complication (HCC), Hyperlipidemia, Hypertension, and Thyroid disease.   Surgical History:   Past Surgical History:  Procedure Laterality Date   appendectomy     APPENDECTOMY     BREAST BIOPSY Right    CORE W/CLIP - NEG   ESOPHAGOGASTRODUODENOSCOPY N/A 10/28/2023   Procedure: ESOPHAGOGASTRODUODENOSCOPY (EGD);  Surgeon: Midge Minium, MD;  Location: [facility] ENDOSCOPY;  Service: Endoscopy;  Laterality: N/A;   TOTAL VAGINAL HYSTERECTOMY     TUMOR REMOVAL     benign;stomach   VIDEO BRONCHOSCOPY WITH ENDOBRONCHIAL NAVIGATION Right 10/20/2023   Procedure: VIDEO BRONCHOSCOPY WITH ENDOBRONCHIAL NAVIGATION;  Surgeon: Vida Rigger, MD;  Location: [facility] ORS;  Service: Thoracic;  Laterality: Right;     Social History:   reports that she quit smoking about 37 years ago. Her smoking use included cigarettes. She has never used smokeless tobacco. She reports that she does not drink alcohol and does not use drugs.   Family History:  Her family history includes Breast cancer in her maternal aunt and maternal grandmother; Breast cancer (age of onset: 55) in her sister; Heart attack in her mother; Hypertension in her mother.  Allergies Allergies  Allergen Reactions   Penicillins Hives   Aspirin Other (See Comments)    Lips numb   Sulfa Antibiotics      Home Medications  Prior to Admission medications   Medication Sig Start Date End Date Taking? Authorizing Provider  aspirin EC 81 MG tablet Take 81 mg by mouth daily.   Yes [provider]  atorvastatin (LIPITOR) 20 MG tablet Take 20 mg by mouth daily. 06/25/23  Yes [provider]  cetirizine (ZYRTEC) 10 MG tablet Take 10 mg by mouth daily.   Yes [provider]  COMBIVENT RESPIMAT 20-100 MCG/ACT AERS respimat Inhale into the lungs every 6 (six) hours as needed for wheezing or shortness of breath. 09/06/23  Yes [provider]   dextromethorphan-guaiFENesin (MUCINEX DM) 30-600 MG 12hr tablet Take 1 tablet by mouth 2 (two) times daily as needed for cough. 06/08/23  Yes Arnetha Courser, MD  diclofenac sodium (VOLTAREN) 1 % GEL Apply topically 4 (four) times daily.   Yes [provider]  DULERA 200-5 MCG/ACT AERO Inhale 2 puffs into the lungs every 12 (twelve) hours. 09/06/23  Yes [provider]  furosemide (LASIX) 20 MG tablet Take 1 tablet (20 mg total) by mouth daily. 09/27/23  Yes Hall, Carole N, DO  glipiZIDE (GLUCOTROL) 10 MG tablet Take 10 mg by mouth daily before breakfast.   Yes [provider]  ipratropium (ATROVENT HFA) 17 MCG/ACT inhaler Inhale 2 puffs into the lungs every 6 (six) hours.   Yes [provider]  levothyroxine (SYNTHROID, LEVOTHROID) 100 MCG tablet Take 100 mcg by mouth daily before breakfast.   Yes [provider]  lisinopril-hydrochlorothiazide (PRINZIDE,ZESTORETIC) 20-25 MG per tablet Take 1 tablet by mouth daily.   Yes [provider]  Multiple Vitamin (MULTIVITAMIN) capsule Take 1 capsule by mouth daily.   Yes [provider]  predniSONE (STERAPRED UNI-PAK 21 TAB) 10 MG (21) TBPK tablet As directed on packaging 09/23/23  Yes Bradler, Clent Jacks, MD  pregabalin (LYRICA) 50 MG capsule Take 50 mg by mouth 2 (two) times daily.   Yes [provider]  sertraline (ZOLOFT) 100 MG tablet Take 100 mg by mouth daily. 06/28/23  Yes [provider]  TRADJENTA 5 MG TABS tablet Take 5 mg by mouth daily. 07/26/23  Yes [provider]     Critical care time: 40 minutes      Harlon Ditty, AGACNP-BC [facility] Prefer epic messenger for cross cover needs If after hours, please call E-link

## 2023-10-30 NOTE — Procedures (Signed)
Interventional Radiology Procedure Note  Procedure: Visceral angiogram and coil embolization of duodenal artery arising from the proximal GDA (site of bleed on recent CTA).   Complications: None  Estimated Blood Loss: None  Recommendations: - Trend H&H and transfuse as needed   Signed,  Sterling Big, MD

## 2023-10-30 NOTE — Progress Notes (Signed)
RT assisted with patient transport to IR and back to ICU with no complications. Pt transported on Servo-air ventilator.

## 2023-10-30 NOTE — Consult Note (Signed)
PHARMACY CONSULT NOTE - ELECTROLYTES  Pharmacy Consult for Electrolyte Monitoring and Replacement   Recent Labs: Height: 5' (152.4 cm) Weight: 66.5 kg (146 lb 9.7 oz) IBW/kg (Calculated) : 45.5 Estimated Creatinine Clearance: 52.5 mL/min (by C-G formula based on SCr of 0.86 mg/dL). Potassium (mmol/L)  Date Value  10/30/2023 3.9  09/30/2013 3.7   Magnesium (mg/dL)  Date Value  53/66/4403 2.1   Calcium (mg/dL)  Date Value  47/42/5956 8.2 (L)   Calcium, Total (mg/dL)  Date Value  38/75/6433 9.2   Albumin (g/dL)  Date Value  29/51/8841 2.7 (L)  09/30/2013 3.7   Phosphorus (mg/dL)  Date Value  66/04/3015 3.0   Sodium (mmol/L)  Date Value  10/30/2023 138  09/30/2013 136    Assessment  Michele House is a 67 y.o. female presenting with shortness of breath. PMH significant for COPD, chronic respiratory failure on 2 L, hypertension, type 2 diabetes, HFpEF. Recent discharge from the hospital 24th November 2024 after hospitalization for COPD exacerbation complicated by respiratory failure. She presented to the hospital again on 10/02/2023 with chest pain, malaise, cough, shortness of breath, abdominal pain and dysuria. Pharmacy has been consulted to monitor and replace electrolytes.  Goal of Therapy: Electrolytes WNL  Plan:  No electrolyte replacement warranted for today Will check all electrolytes with AM labs  Thank you for allowing pharmacy to be a part of this patient's care.  Lowella Bandy, PharmD Clinical Pharmacist 10/30/2023 7:01 AM

## 2023-10-30 NOTE — Progress Notes (Signed)
Palliative consult received.  Ms. Gurule remains intubated and sedated. No family at bedside. Called and spoke with daughter-[name], plan set to meet at bedside tomorrow afternoon.  No Charge.  Leeanne Deed, DNP, AGNP-C Palliative Medicine  Please call Palliative Medicine team phone with any questions [phone]. For individual providers please see AMION.

## 2023-10-30 NOTE — Plan of Care (Signed)
   Problem: Fluid Volume: Goal: Ability to maintain a balanced intake and output will improve Outcome: Progressing   Problem: Skin Integrity: Goal: Risk for impaired skin integrity will decrease Outcome: Progressing   Problem: Tissue Perfusion: Goal: Adequacy of tissue perfusion will improve Outcome: Progressing

## 2023-10-31 DIAGNOSIS — Z515 Encounter for palliative care: Secondary | ICD-10-CM

## 2023-10-31 DIAGNOSIS — J441 Chronic obstructive pulmonary disease with (acute) exacerbation: Secondary | ICD-10-CM | POA: Diagnosis not present

## 2023-10-31 DIAGNOSIS — I5032 Chronic diastolic (congestive) heart failure: Secondary | ICD-10-CM | POA: Diagnosis not present

## 2023-10-31 DIAGNOSIS — K269 Duodenal ulcer, unspecified as acute or chronic, without hemorrhage or perforation: Secondary | ICD-10-CM

## 2023-10-31 DIAGNOSIS — G9341 Metabolic encephalopathy: Secondary | ICD-10-CM | POA: Diagnosis not present

## 2023-10-31 LAB — BASIC METABOLIC PANEL
Anion gap: 6 (ref 5–15)
BUN: 33 mg/dL — ABNORMAL HIGH (ref 8–23)
CO2: 28 mmol/L (ref 22–32)
Calcium: 8.2 mg/dL — ABNORMAL LOW (ref 8.9–10.3)
Chloride: 107 mmol/L (ref 98–111)
Creatinine, Ser: 0.83 mg/dL (ref 0.44–1.00)
GFR, Estimated: >60 mL/min (ref >60)
Glucose, Bld: 104 mg/dL — ABNORMAL HIGH (ref 70–99)
Potassium: 3.5 mmol/L (ref 3.5–5.1)
Sodium: 141 mmol/L (ref 135–145)

## 2023-10-31 LAB — CBC
HCT: 24 % — ABNORMAL LOW (ref 36.0–46.0)
HCT: 24.3 % — ABNORMAL LOW (ref 36.0–46.0)
Hemoglobin: 8 g/dL — ABNORMAL LOW (ref 12.0–15.0)
Hemoglobin: 7.8 g/dL — ABNORMAL LOW (ref 12.0–15.0)
MCH: 30.1 pg (ref 26.0–34.0)
MCH: 30.4 pg (ref 26.0–34.0)
MCHC: 33.3 g/dL (ref 30.0–36.0)
MCHC: 32.1 g/dL (ref 30.0–36.0)
MCV: 90.2 fL (ref 80.0–100.0)
MCV: 94.6 fL (ref 80.0–100.0)
Platelets: 314 10*3/uL (ref 150–400)
Platelets: 288 10*3/uL (ref 150–400)
RBC: 2.66 MIL/uL — ABNORMAL LOW (ref 3.87–5.11)
RBC: 2.57 MIL/uL — ABNORMAL LOW (ref 3.87–5.11)
RDW: 19.6 % — ABNORMAL HIGH (ref 11.5–15.5)
RDW: 20 % — ABNORMAL HIGH (ref 11.5–15.5)
WBC: 10.3 10*3/uL (ref 4.0–10.5)
WBC: 8.1 10*3/uL (ref 4.0–10.5)
nRBC: 0.5 % — ABNORMAL HIGH (ref 0.0–0.2)
nRBC: 0.6 % — ABNORMAL HIGH (ref 0.0–0.2)

## 2023-10-31 LAB — GLUCOSE, CAPILLARY
Glucose-Capillary: 103 mg/dL — ABNORMAL HIGH (ref 70–99)
Glucose-Capillary: 108 mg/dL — ABNORMAL HIGH (ref 70–99)
Glucose-Capillary: 109 mg/dL — ABNORMAL HIGH (ref 70–99)
Glucose-Capillary: 110 mg/dL — ABNORMAL HIGH (ref 70–99)
Glucose-Capillary: 114 mg/dL — ABNORMAL HIGH (ref 70–99)
Glucose-Capillary: 119 mg/dL — ABNORMAL HIGH (ref 70–99)
Glucose-Capillary: 126 mg/dL — ABNORMAL HIGH (ref 70–99)
Glucose-Capillary: 56 mg/dL — ABNORMAL LOW (ref 70–99)
Glucose-Capillary: 61 mg/dL — ABNORMAL LOW (ref 70–99)
Glucose-Capillary: 72 mg/dL (ref 70–99)
Glucose-Capillary: 73 mg/dL (ref 70–99)
Glucose-Capillary: 77 mg/dL (ref 70–99)
Glucose-Capillary: 78 mg/dL (ref 70–99)
Glucose-Capillary: 92 mg/dL (ref 70–99)
Glucose-Capillary: 93 mg/dL (ref 70–99)

## 2023-10-31 LAB — BETA-2-GLYCOPROTEIN I ABS, IGG/M/A
Beta-2 Glyco I IgG: <9 GPI IgG units (ref 0–20)
Beta-2-Glycoprotein I IgA: <9 GPI IgA units (ref 0–25)
Beta-2-Glycoprotein I IgM: <9 GPI IgM units (ref 0–32)

## 2023-10-31 LAB — CULTURE, RESPIRATORY W GRAM STAIN

## 2023-10-31 LAB — HEMOGLOBIN AND HEMATOCRIT, BLOOD
HCT: 25 % — ABNORMAL LOW (ref 36.0–46.0)
Hemoglobin: 8.2 g/dL — ABNORMAL LOW (ref 12.0–15.0)

## 2023-10-31 LAB — PREPARE RBC (CROSSMATCH)

## 2023-10-31 LAB — CARDIOLIPIN ANTIBODIES, IGG, IGM, IGA
Anticardiolipin IgA: <9 [APL'U]/mL (ref 0–11)
Anticardiolipin IgG: <9 [GPL'U]/mL (ref 0–14)
Anticardiolipin IgM: <9 [MPL'U]/mL (ref 0–12)

## 2023-10-31 LAB — MAGNESIUM: Magnesium: 2.4 mg/dL (ref 1.7–2.4)

## 2023-10-31 MED ORDER — DEXTROSE 50 % IV SOLN
12.5000 g | Freq: Once | INTRAVENOUS | Status: AC
  Administered 2023-10-31: 12.5 g via INTRAVENOUS
  Filled 2023-10-31: qty 50

## 2023-10-31 MED ORDER — ORAL CARE MOUTH RINSE
15.0000 mL | OROMUCOSAL | Status: DC
  Administered 2023-10-31 – 2023-11-01 (×17): 15 mL via OROMUCOSAL

## 2023-10-31 MED ORDER — ORAL CARE MOUTH RINSE
15.0000 mL | OROMUCOSAL | Status: DC | PRN
Start: 2023-10-31 — End: 2023-11-10

## 2023-10-31 MED ORDER — IPRATROPIUM-ALBUTEROL 0.5-2.5 (3) MG/3ML IN SOLN
3.0000 mL | RESPIRATORY_TRACT | Status: DC
  Administered 2023-10-31 – 2023-11-01 (×7): 3 mL via RESPIRATORY_TRACT
  Filled 2023-10-31 (×8): qty 3

## 2023-10-31 MED ORDER — ALBUTEROL SULFATE (2.5 MG/3ML) 0.083% IN NEBU
7.5000 mg | INHALATION_SOLUTION | Freq: Once | RESPIRATORY_TRACT | Status: AC
  Administered 2023-10-31: 7.5 mg via RESPIRATORY_TRACT

## 2023-10-31 MED ORDER — MIDAZOLAM HCL 2 MG/2ML IJ SOLN: 1.0000 mg | INTRAMUSCULAR | Status: DC | PRN

## 2023-10-31 MED ORDER — PROPOFOL 1000 MG/100ML IV EMUL
5.0000 ug/kg/min | INTRAVENOUS | Status: DC
Start: 2023-10-31 — End: 2023-11-01
  Administered 2023-10-31: 35 ug/kg/min via INTRAVENOUS
  Administered 2023-10-31: 25 ug/kg/min via INTRAVENOUS
  Administered 2023-10-31: 5 ug/kg/min via INTRAVENOUS
  Administered 2023-11-01: 35 ug/kg/min via INTRAVENOUS
  Filled 2023-10-31 (×4): qty 100

## 2023-10-31 MED ORDER — DEXTROSE 50 % IV SOLN
INTRAVENOUS | Status: AC
  Administered 2023-10-31: 50 mL
  Filled 2023-10-31: qty 50

## 2023-10-31 MED ORDER — ALBUTEROL SULFATE (2.5 MG/3ML) 0.083% IN NEBU
INHALATION_SOLUTION | RESPIRATORY_TRACT | Status: AC
  Filled 2023-10-31: qty 9

## 2023-10-31 MED ORDER — SODIUM CHLORIDE 0.9 % IV SOLN
1.0000 g | INTRAVENOUS | Status: AC
  Administered 2023-10-31 – 2023-11-03 (×4): 1 g via INTRAVENOUS
  Filled 2023-10-31 (×4): qty 10

## 2023-10-31 NOTE — Plan of Care (Signed)
  Problem: Fluid Volume: Goal: Ability to maintain a balanced intake and output will improve Outcome: Progressing   Problem: Metabolic: Goal: Ability to maintain appropriate glucose levels will improve Outcome: Progressing   Problem: Skin Integrity: Goal: Risk for impaired skin integrity will decrease Outcome: Progressing

## 2023-10-31 NOTE — Consult Note (Signed)
PHARMACY CONSULT NOTE - ELECTROLYTES  Pharmacy Consult for Electrolyte Monitoring and Replacement   Recent Labs: Height: 5' (152.4 cm) Weight: 66.5 kg (146 lb 9.7 oz) IBW/kg (Calculated) : 45.5 Estimated Creatinine Clearance: 54.4 mL/min (by C-G formula based on SCr of 0.83 mg/dL). Potassium (mmol/L)  Date Value  10/31/2023 3.5  09/30/2013 3.7   Magnesium (mg/dL)  Date Value  16/08/9603 2.4   Calcium (mg/dL)  Date Value  54/07/8118 8.2 (L)   Calcium, Total (mg/dL)  Date Value  14/78/2956 9.2   Albumin (g/dL)  Date Value  21/30/8657 2.7 (L)  09/30/2013 3.7   Phosphorus (mg/dL)  Date Value  84/69/6295 3.0   Sodium (mmol/L)  Date Value  10/31/2023 141  09/30/2013 136    Assessment  Michele House is a 67 y.o. female presenting with shortness of breath. PMH significant for COPD, chronic respiratory failure on 2 L, hypertension, type 2 diabetes, HFpEF. Recent discharge from the hospital 24th November 2024 after hospitalization for COPD exacerbation complicated by respiratory failure. She presented to the hospital again on 10/02/2023 with chest pain, malaise, cough, shortness of breath, abdominal pain and dysuria. Pharmacy has been consulted to monitor and replace electrolytes.  Goal of Therapy: Electrolytes WNL  Plan:  No electrolyte replacement warranted for today Will check all electrolytes with AM labs  Thank you for allowing pharmacy to be a part of this patient's care.  Lowella Bandy, PharmD Clinical Pharmacist 10/31/2023 7:11 AM

## 2023-10-31 NOTE — Progress Notes (Signed)
NAME:  Michele House, MRN:  [id number], DOB:  Feb 17, 1956, LOS: 29 ADMISSION DATE:  10/02/2023   History of Present Illness:  Case of a 67 year old female patient with a past medical history of COPD with chronic respiratory failure on 2 L nasal cannula, hypertension, type 2 diabetes, heart failure with preserved EF who has been here for a month.  Initially presented with altered mental status encephalopathy.  Had an extensive workup including brain MRI which was negative for any acute intracranial abnormality initially.  However unfortunately course complicated by acute CVA with repeat MRI showing multiple acute infarcts in the high bilateral posterior frontal and parietal lobes.  She recovered from that but course further complicated by waxing and waning mental status which prompted an LP to rule out encephalitis and has been unremarkable so far.  She was found to have right upper lobe lung nodule which was concerning for malignancy and underwent robotic assisted navigational bronchoscopy with biopsy on 12/27 which showed very rare atypical cells and reactive alveolar cells mostly.   She was recovering on the floor until 12/23 which started complaining of shortness of breath and was found to have metapneumovirus and respiratory viral panel.  Unfortunately course complicated by worsening hypoxic respiratory failure and was transferred to the ICU on 12/26 requiring intubation and mechanical ventilation.   Course further complicated by acute anemia and profound shock requiring 2 packed RBC trauma blood.  Likely source is gastrointestinal.   S/p EGD 10/28/2023 with oozing duodenal ulcer noted. Injected and Clipped. H&H with slight decrease this AM. Hemodynamically improving.    Unfortunately self extubated around 1:30 pm yesterday, remained with significant wheezing and chest tightness, raacemic epi given without any effect. We proceeded with re-intubation.   Pertinent  Medical History  As  abnove  Significant Hospital Events: Including procedures, antibiotic start and stop dates in addition to other pertinent events   12/01: Vital stable, respiratory viral panel negative, procalcitonin negative, preliminary blood cultures negative.  Urine cultures are ordered as add-on, ammonia levels mildly elevated at 39, strep pneumo negative, CBG elevated at 244, history of enlarging pulmonary nodule with recommendations for PET/CT during most recent admission which has not been done yet, if video bronchoscopy was scheduled for 10/13/2023 as outpatient. 12/02: Unable to take care of herself and son cannot provide the required care as she required 24-hour supervision due to worsening dementia.  PT is recommending SNF. 12/03: Blood pressure started trending up, restarting home Zestoretic.  Urine cultures with strep agalactiae, penicillin allergy noted, will continue with ceftriaxone for now.  Also started on Remeron for concern of worsening depression and unable to sleep at night  12/05:  Patient has some blurry vision and weakness and pain.  MRI of the brain negative for acute intracranial abnormality. 12/08:  Patient more confused today than yesterday.  Able to follow some simple commands but not as good of a conversation today as yesterday. 12/09:  Patient still confused today.  Does not follow simple commands today. 12/10: Patient more talkative today but was looking up at the ceiling when she was talking with me.  She does not move her extremities to command but does move them on her own.  Physical therapy and Occupational Therapy did not see her move her right arm. 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  Mild associated petechial hemorrhage.  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 12/12: Patient with  new stroke, CTA head and neck with no large vessel occlusion.  Patient had a recent echocardiogram which was normal. Concern of paraneoplastic versus hypercoagulability secondary to malignancy.  Patient missed her appointment for bronchoscopy and outpatient appointment for PET scan due to recurrent hospitalizations. 12/13: Patient continued to have waxing and waning mental status, unable to move right upper and lower extremities.  Having some hallucination.  Pulmonary is planning lung biopsy on 12/27 if she remains stable.  Rapidly progressive dementia and now with underlying stroke, question of paraneoplastic encephalitis and hypercoagulability secondary to underlying undiagnosed malignancy. 12/15: Patient with much improved mentation.  Having some flickering of movements on right upper and lower extremity today, left extremities weaker but seems improving.   12/18: Pt underwent bronchoscopy and lung biopsy 12/19: patient stable , she is being optimized for dc for rehab.  She's cleared from pulmonary for discharge. Results from bronch will take 1 week.   12/26: Pt transferred to the stepdown unit with acute hypercapnic respiratory requiring Bipap but subsequently required mechanical intubation due to worsening respiratory failure.  Insulin gtt initiated due to severe hyperglycemia.  With hemorrhagic shock due to bleeding duodenal ulcer, underwent emergent EGD with successful hemostasis by injection of Epinephrine and hemostatic clip. 12/27: Hgb slowly trending down, no report of bleeding from OG suction nor melena. Weaning down vasopressors, if able to continue to wean vasopressors, then can consider WUA/SBT.  Giving temporizing measures for Hyperkalemia. Self extubated but required reintubation. Hgb continued to trend down, given 1 unit pRBC's and 2 FFP.  IR consulted for consideration for embolization. 12/28: Weaned off Vasopressin, Levophed weaning down.  S/p IR embolization of the Duodenal artery. Peak  pressures and lung sounds improved with Ketamine, will keep intubated today given rapid reintubation yesterday.  Interim History / Subjective:  Remains Intubated with significant wheezing and chest tightness.   Objective   Blood pressure (!) 106/57, pulse 95, temperature 98.5 F (36.9 C), temperature source Oral, resp. rate 14, height 5' (1.524 m), weight 66.5 kg, last menstrual period 12/24/1998, SpO2 99%.    Vent Mode: PRVC FiO2 (%):  [28 %-30 %] 28 % Set Rate:  [14 bmp] 14 bmp Vt Set:  [450 mL] 450 mL PEEP:  [5 cmH20] 5 cmH20 Plateau Pressure:  [16 cmH20-26 cmH20] 16 cmH20   Intake/Output Summary (Last 24 hours) at 10/31/2023 1410 Last data filed at 10/31/2023 1151 Gross per 24 hour  Intake 855.5 ml  Output 1285 ml  Net -429.5 ml   Filed Weights   10/05/23 0912 10/20/23 0732 10/28/23 1431  Weight: 74.1 kg 74.1 kg 66.5 kg    Examination: General: Awake, follows simple commands  HENT: Supple neck reactive pupils  Lungs: Diffuse wheezing and diminished air movement.  Cardiovascular: Normal S1, Normal S2, RRR  Abdomen: Soft, non tender, non distended, +BS  Extremities: Warm and well perfused no edema.   Labs and imaging were reviewed.   Assessment & Plan:  Case of a 67 year old female patient presenting 4 weeks ago with confusion and altered mental status with course c/b AHRF requring intubation 12/26 likely secondary to metapneumovirus and COPD exacerbation. Course complicated by hemorrhagic shock from a duodenal ulcer s/p EGD 12/26 with clip placed s/p IR embolization 10/30/2023. Remains intubated with significant wheezing and chest tightness.   #Acute hypoxic respiratory failure secondary to #COPD/asthma exacerbation in the setting of metapneumovirus infection #Acute anemia with  hemorrhagic shock secondary to Duodenal ulcer s.p EGD clip and Injection 10/28/2023, s/p IR Embolization on 10/30/2023 #Metabolic encephalopathy #Acute CVA during this admission with right sided  hemiparesis #AKI with a creatinine of 1.19 from a baseline of 0.8 mg/dL.     CNS sedation with Propofol for RASS 0 to -1. Ketamine drip Pulmonary methylprednisolone IV 40 mg every 24 hours.  Albuterol 7.5mg  x1 DuoNebs scheduled every 3 hours.  Keep respiratory rate at 14 for ventilation.  Peak pressure in the 30 and low 20s . Ceftriaxone 1 g for 5 days.  CVS norepinephrine for MAP greater than 65.  Renal monitor UOP.  Resuscitation as above.  Trend creatinine daily. Heme Discontinue Plavix. IV PPI BID. H&H at 18:00 Endo Long acting insulin and ISS.    Best Practice (right click and "Reselect all SmartList Selections" daily)   Diet/type: NPO DVT prophylaxis SCD Pressure ulcer(s): N/A GI prophylaxis: PPI Lines: Central line Foley:  Yes, and it is still needed Code Status:  full code  Last date of multidisciplinary goals of care discussion [10/31/2023]  I spent 60 minutes caring for this patient today, including preparing to see the patient, obtaining a medical history , performing a medically appropriate examination and/or evaluation, counseling and educating the patient/family/caregiver, documenting clinical information in the electronic health record, and independently interpreting results (not separately reported/billed) and communicating results to the patient/family/caregiver  Janann Colonel, MD [facility] 10/31/2023 2:29 PM

## 2023-10-31 NOTE — Progress Notes (Signed)
Referring Physician(s): Judithe Modest., NP   Supervising Physician: Malachy Moan  Patient Status:  [facility] - In-pt  Chief Complaint: GI bleed s/p embolization of duodenal ulcer 10/30/23 by Dr. Archer Asa  Subjective: Patient intubated, sedated and on pressors. No acute discomfort or distress observed.   Allergies: Penicillins, Aspirin, and Sulfa antibiotics  Medications: Prior to Admission medications   Medication Sig Start Date End Date Taking? Authorizing Provider  aspirin EC 81 MG tablet Take 81 mg by mouth daily.   Yes [provider]  atorvastatin (LIPITOR) 20 MG tablet Take 20 mg by mouth daily. 06/25/23  Yes [provider]  cetirizine (ZYRTEC) 10 MG tablet Take 10 mg by mouth daily.   Yes [provider]  COMBIVENT RESPIMAT 20-100 MCG/ACT AERS respimat Inhale into the lungs every 6 (six) hours as needed for wheezing or shortness of breath. 09/06/23  Yes [provider]  dextromethorphan-guaiFENesin (MUCINEX DM) 30-600 MG 12hr tablet Take 1 tablet by mouth 2 (two) times daily as needed for cough. 06/08/23  Yes Arnetha Courser, MD  diclofenac sodium (VOLTAREN) 1 % GEL Apply topically 4 (four) times daily.   Yes [provider]  DULERA 200-5 MCG/ACT AERO Inhale 2 puffs into the lungs every 12 (twelve) hours. 09/06/23  Yes [provider]  furosemide (LASIX) 20 MG tablet Take 1 tablet (20 mg total) by mouth daily. 09/27/23  Yes Hall, Carole N, DO  glipiZIDE (GLUCOTROL) 10 MG tablet Take 10 mg by mouth daily before breakfast.   Yes [provider]  ipratropium (ATROVENT HFA) 17 MCG/ACT inhaler Inhale 2 puffs into the lungs every 6 (six) hours.   Yes [provider]  levothyroxine (SYNTHROID, LEVOTHROID) 100 MCG tablet Take 100 mcg by mouth daily before breakfast.   Yes [provider]  lisinopril-hydrochlorothiazide (PRINZIDE,ZESTORETIC) 20-25 MG per tablet Take 1 tablet by mouth daily.   Yes [provider]  Multiple Vitamin (MULTIVITAMIN) capsule Take 1 capsule by mouth daily.   Yes [provider]  predniSONE (STERAPRED UNI-PAK 21 TAB) 10 MG (21) TBPK tablet As directed on packaging 09/23/23  Yes Bradler, Clent Jacks, MD  pregabalin (LYRICA) 50 MG capsule Take 50 mg by mouth 2 (two) times daily.   Yes [provider]  sertraline (ZOLOFT) 100 MG tablet Take 100 mg by mouth daily. 06/28/23  Yes [provider]  TRADJENTA 5 MG TABS tablet Take 5 mg by mouth daily. 07/26/23  Yes [provider]     Vital Signs: BP (!) 106/57   Pulse 95   Temp 98.5 F (36.9 C) (Oral)   Resp 14   Ht 5' (1.524 m)   Wt 146 lb 9.7 oz (66.5 kg)   LMP 12/24/1998 Comment: prior to her hysterectomy  SpO2 99%   BMI 28.63 kg/m   Physical Exam Constitutional:      Comments: Intubated/sedated   Cardiovascular:     Rate and Rhythm: Normal rate.     Pulses: Normal pulses.     Comments: Right groin vascular site is soft and dry. Small amount of old drainage on gauze.  Pulmonary:     Comments: Intubated/sedated Neurological:     Comments: Intubated/sedated. Some spontaneous movement observed during assessment.      Imaging: IR EMBO ART  VEN HEMORR LYMPH EXTRAV  INC GUIDE ROADMAPPING Result Date: 10/30/2023 INDICATION: 67 year old female with bleeding duodenal ulcer and persistently decreasing hemoglobin despite endoscopic intervention. She presents for coil embolization. Review of the CT arteriogram  from 10/28/2023 demonstrates a duodenal branch artery arising from the proximal GDA which appears to supply the region of bleeding. The remainder of the GDA and gastroepiploic arteries are remote from the duodenum on CT imaging. EXAM: IR EMBO ART  VEN HEMORR LYMPH EXTRAV  INC GUIDE ROADMAPPING MEDICATIONS: None. ANESTHESIA/SEDATION: None.  Patient was maintained by ICU nursing on a ketamine drip. CONTRAST:  55mL OMNIPAQUE IOHEXOL 300 MG/ML  SOLN FLUOROSCOPY: Radiation Exposure  Index (as provided by the fluoroscopic device): 1224.1 mGy Kerma COMPLICATIONS: None immediate. PROCEDURE: Informed consent was obtained from the patient following explanation of the procedure, risks, benefits and alternatives. The patient understands, agrees and consents for the procedure. All questions were addressed. A time out was performed prior to the initiation of the procedure. Maximal barrier sterile technique utilized including caps, mask, sterile gowns, sterile gloves, large sterile drape, hand hygiene, and Betadine prep. The right common femoral artery was interrogated with ultrasound and found to be widely patent. An image was obtained and stored for the medical record. Local anesthesia was attained by infiltration with 1% lidocaine. A small dermatotomy was made. Under real-time sonographic guidance, the vessel was punctured with a 21 gauge micropuncture needle. Using standard technique, the initial micro needle was exchanged over a 0.018 micro wire for a transitional 4 [place] micro sheath. The micro sheath was then exchanged over a 0.035 wire for a 5 French vascular sheath. A C2 catheter was advanced over a Bentson wire and used to select the celiac artery. A celiac arteriogram was performed. The common hepatic artery was identified. Utilizing a Glidewire, the C2 cobra catheter was advanced through the common hepatic artery and into the gastroduodenal artery. Contrast injection was performed. Identification of the duodenal branch artery arising from the lateral aspect of the proximal GDA was achieved. A renegade STC microcatheter was then advanced over a Fathom 16 wire and used to select the duodenal branch artery. Additional contrast injection in several obliquities was performed confirming that the artery provides supply to the region of the endoscopic clip. The microcatheter was advanced more distally within the artery. Coil embolization was then performed using a series of 1, 2 and 3 mm detachable  penumbra microcoils. Follow-up arteriography demonstrates complete embolization of the vessel. The catheters were removed. Hemostasis was attained with the assistance of a Celt arterial closure device. IMPRESSION: Successful embolization of duodenal branch artery arising from the proximal GDA and providing supply to the region of ulceration and the endoscopic clip. Electronically Signed   By: Malachy Moan M.D.   On: 10/30/2023 15:09   DG Abd 1 View Result Date: 10/29/2023 CLINICAL DATA:  Enteric catheter placement EXAM: ABDOMEN - 1 VIEW COMPARISON:  10/29/2023 FINDINGS: Frontal view of the lower chest and upper abdomen demonstrates enteric catheter tip and side port projecting over the gastric fundus. Minimal residual oral contrast within the gastric lumen. Hemostasis clip projecting over the region of the proximal duodenum. IMPRESSION: 1. Enteric catheter tip projecting over the gastric fundus. Electronically Signed   By: Sharlet Salina M.D.   On: 10/29/2023 15:36   DG Chest Port 1 View Result Date: 10/29/2023 CLINICAL DATA:  Intubated EXAM: PORTABLE CHEST 1 VIEW COMPARISON:  10/28/2023 FINDINGS: Single frontal view of the chest demonstrates endotracheal tube overlying tracheal air column, tip 3.2 cm above carina. Enteric catheter passes below diaphragm tip excluded by collimation. Right internal jugular catheter tip overlies superior vena cava. The cardiac silhouette is stable. The spiculated right upper lobe nodule seen on prior CT  is again identified. Smaller nodule seen elsewhere within the right lung are not well visualized by x-ray. No acute airspace disease, effusion, or pneumothorax. No acute bony abnormality. IMPRESSION: 1. Support devices as above. 2. Spiculated right upper lobe nodule as seen on previous CT. The remaining right-sided nodules are not well visualized by x-ray. 3. No acute airspace disease. Electronically Signed   By: Sharlet Salina M.D.   On: 10/29/2023 15:34   DG Abd 1  View Result Date: 10/29/2023 CLINICAL DATA:  OG tube placement EXAM: ABDOMEN - 1 VIEW COMPARISON:  12/26/2006 FINDINGS: OG tube is in the mid stomach. Prior cholecystectomy. Visualized lung bases clear. IMPRESSION: OG tube in the stomach. Electronically Signed   By: Charlett Nose M.D.   On: 10/29/2023 02:43   CT Angio Chest Pulmonary Embolism (PE) W or WO Contrast Result Date: 10/28/2023 CLINICAL DATA:  Concern for pulmonary embolism. Pulmonary nodules concerning for neoplasm. EXAM: CT ANGIOGRAPHY CHEST WITH CONTRAST TECHNIQUE: Multidetector CT imaging of the chest was performed using the standard protocol during bolus administration of intravenous contrast. Multiplanar CT image reconstructions and MIPs were obtained to evaluate the vascular anatomy. RADIATION DOSE REDUCTION: This exam was performed according to the departmental dose-optimization program which includes automated exposure control, adjustment of the mA and/or kV according to patient size and/or use of iterative reconstruction technique. CONTRAST:  OMNIPAQUE IOHEXOL 350 MG/ML SOLN COMPARISON:  Chest CT dated 10/19/2023. FINDINGS: Cardiovascular: There is no cardiomegaly or pericardial effusion. There is moderate calcified and noncalcified plaque of the thoracic aorta. No aneurysmal dilatation or dissection. The origins of the great vessels of the aortic arch appear patent as visualized. No pulmonary artery embolus identified. Mediastinum/Nodes: No obvious hilar or mediastinal adenopathy. An enteric tube noted in the esophagus. No mediastinal fluid collection. Lungs/Pleura: Background of centrilobular emphysema. Similar appearance of 16 x 19 mm nodule with spiculated margin in the anterior right upper lobe as well as additional smaller ground-glass nodule in the right upper lobe superiorly measuring 7 mm. There is a newly developed ovoid opacity in the anteromedial right upper lobe adjacent to the dominant nodule which may represent an  impacted mucus or postobstructive pneumonia. A 12 mm ground-glass density in the right middle lobe appears similar to prior CT. There is however scattered micro nodularity in the right upper and right lower lobe, progressed since the prior CT concerning for an inflammatory/infectious process. No pleural effusion pneumothorax. The central airways remain patent. Upper Abdomen: Gallstones. Enteric tube with tip in the body of the stomach. Musculoskeletal: No acute osseous pathology. Review of the MIP images confirms the above findings. IMPRESSION: 1. No CT evidence of pulmonary artery embolus. 2. Similar appearance of 16 x 19 mm nodule with spiculated margin in the anterior right upper lobe most consistent with malignancy. Additional ground-glass nodules similar to prior CT. 3. Newly developed ovoid opacity in the anteromedial right upper lobe may represent an impacted mucus or postobstructive pneumonia. 4. Scattered micro nodularity in the right upper and right lower lobe, progressed since the prior CT concerning for an inflammatory/infectious process. 5. Cholelithiasis. 6. Aortic Atherosclerosis (ICD10-I70.0) and Emphysema (ICD10-J43.9). Electronically Signed   By: Elgie Collard M.D.   On: 10/28/2023 20:59   CT ANGIO GI BLEED Result Date: 10/28/2023 CLINICAL DATA:  GI hemorrhage EXAM: CTA ABDOMEN AND PELVIS WITHOUT AND WITH CONTRAST TECHNIQUE: Multidetector CT imaging of the abdomen and pelvis was performed using the standard protocol during bolus administration of intravenous contrast. Multiplanar reconstructed images and MIPs were  obtained and reviewed to evaluate the vascular anatomy. RADIATION DOSE REDUCTION: This exam was performed according to the departmental dose-optimization program which includes automated exposure control, adjustment of the mA and/or kV according to patient size and/or use of iterative reconstruction technique. CONTRAST:  OMNIPAQUE IOHEXOL 350 MG/ML SOLN COMPARISON:  None  Available. FINDINGS: VASCULAR Aorta: Diffuse atherosclerotic calcifications are noted. No aneurysmal dilatation or dissection is seen. Celiac: Patent without evidence of aneurysm, dissection, vasculitis or significant stenosis. SMA: Patent without evidence of aneurysm, dissection, vasculitis or significant stenosis. Renals: Both renal arteries are patent without evidence of aneurysm, dissection, vasculitis, fibromuscular dysplasia or significant stenosis. IMA: Patent without evidence of aneurysm, dissection, vasculitis or significant stenosis. Inflow: Iliacs demonstrate atherosclerotic calcifications without aneurysmal dilatation. Veins: No specific venous abnormality is identified. Review of the MIP images confirms the above findings. NON-VASCULAR Lower chest: No acute abnormality. Hepatobiliary: Liver is within normal limits. Gallbladder is well distended. Multiple small dependent stones are seen. No obstructive changes are seen. Pancreas: Unremarkable. No pancreatic ductal dilatation or surrounding inflammatory changes. Spleen: Normal in size without focal abnormality. Adrenals/Urinary Tract: Adrenal glands are within normal limits. Kidneys demonstrate a normal enhancement pattern. No renal calculi or obstructive changes are seen. Scattered simple cysts are noted bilaterally. No follow-up is recommended. The bladder is decompressed by Foley catheter. Stomach/Bowel: No obstructive or inflammatory changes of the colon are seen. The appendix is not well visualized consistent with given surgical history. Stomach is within normal limits. Gastric catheter is noted within the proximal stomach. In the region of the second portion of the duodenum on the arterial phase images there are small areas of active GI hemorrhage identified with more diffuse blush seen on the venous delayed images consistent with duodenal hemorrhage. This may be related to an underlying ulcer. No other areas of active GI hemorrhage are seen.  Lymphatic: No significant lymphadenopathy is seen. Reproductive: Status post hysterectomy. No adnexal masses. Other: No abdominal wall hernia or abnormality. No abdominopelvic ascites. Musculoskeletal: No acute or significant osseous findings. IMPRESSION: VASCULAR Active GI hemorrhage in the second portion of the duodenum which may be related to a duodenal ulcer. No other definitive areas of GI hemorrhage are seen. No other vascular abnormality is noted. NON-VASCULAR Cholelithiasis without complicating factors. No other focal abnormality is noted. These results will be called to the ordering clinician or representative by the Radiologist Assistant, and communication documented in the PACS or [employer]. Electronically Signed   By: Alcide Clever M.D.   On: 10/28/2023 20:49   DG Chest Port 1 View Result Date: 10/28/2023 CLINICAL DATA:  Status post central line placement EXAM: PORTABLE CHEST 1 VIEW COMPARISON:  Chest radiograph dated 10/28/2023 at 4:10 p.m. FINDINGS: Lines/tubes: Endotracheal tube tip projects 5.6 cm above the carina. Enteric tube tip reaches the diaphragm and terminates below the field of view. Right internal jugular venous catheter tip projects over the superior cavoatrial junction. Lungs: Well inflated lungs. Unchanged right pulmonary nodule. Patchy left lower lung opacities. Pleura: No pneumothorax or pleural effusion. Heart/mediastinum: The heart size and mediastinal contours are within normal limits. Bones: No acute osseous abnormality. IMPRESSION: 1. Right internal jugular venous catheter tip projects over the superior cavoatrial junction. No pneumothorax. 2. Patchy left lower lung opacities, which may represent atelectasis, aspiration, or pneumonia. Electronically Signed   By: Agustin Cree M.D.   On: 10/28/2023 20:22   DG Chest Port 1 View Result Date: 10/28/2023 CLINICAL DATA:  Check endotracheal tube placement EXAM: PORTABLE CHEST 1 VIEW  COMPARISON:  10/28/2023 FINDINGS:  Endotracheal tube is now seen 5 cm above the carina. Gastric catheter extends into the stomach although the proximal side port lies in the distal esophagus. This should be advanced deeper into the stomach. Stable pulmonary nodule is noted in the right mid lung similar to that seen on prior CT from 10/19/2023. IMPRESSION: Endotracheal tube in satisfactory position. Gastric catheter should be advanced deeper into the stomach. Stable right pulmonary nodule. Electronically Signed   By: Alcide Clever M.D.   On: 10/28/2023 17:09   DG Chest Port 1 View Result Date: 10/28/2023 CLINICAL DATA:  Acute respiratory failure EXAM: PORTABLE CHEST - 1 VIEW COMPARISON:  10/24/2023 FINDINGS: Patient is rotated towards the right. Lungs are clear, mildly hyperinflated. No pneumothorax. Heart size and mediastinal contours are within normal limits. No effusion. Visualized bones unremarkable. IMPRESSION: No acute cardiopulmonary disease. Electronically Signed   By: Corlis Leak M.D.   On: 10/28/2023 16:11    Labs:  CBC: Recent Labs    10/30/23 0100 10/30/23 0515 10/30/23 1147 10/30/23 1759 10/30/23 2205 10/31/23 0415 10/31/23 1030  WBC 17.2* 13.8*  --   --   --  10.3 8.1  HGB 8.8* 8.6*   < > 7.8* 8.1* 8.0* 7.8*  HCT 25.8* 25.1*   < > 23.5* 24.4* 24.0* 24.3*  PLT 382 368  --   --   --  314 288   < > = values in this interval not displayed.    COAGS: Recent Labs    09/23/23 2314 10/28/23 1804  INR 1.1 1.2  APTT  --  26    BMP: Recent Labs    10/29/23 0811 10/29/23 1215 10/30/23 0515 10/31/23 0415  NA 134* 135 138 141  K 6.0* 4.8 3.9 3.5  CL 96* 98 101 107  CO2 29 28 29 28   GLUCOSE 334* 203* 115* 104*  BUN 69* 66* 47* 33*  CALCIUM 8.0* 8.5* 8.2* 8.2*  CREATININE 1.24* 1.19* 0.86 0.83  GFRNONAA 47* 49* >60 >60    LIVER FUNCTION TESTS: Recent Labs    10/09/23 0243 10/19/23 1252 10/24/23 0614 10/28/23 1450  BILITOT 1.2* <0.2 0.7 0.6  AST 18 18 22 30   ALT 18 18 20 27   ALKPHOS 79 70 72 71   PROT 6.1* 5.4* 6.0* 5.5*  ALBUMIN 3.5 3.1* 2.8* 2.7*    Assessment and Plan:  GI bleed s/p embolization of duodenal ulcer 10/30/23 by Dr. Archer Asa  Patient remains intubated and sedated. She is on levophed for pressure support. Hemoglobin is stable at 7.8. Right groin vascular site is soft and dry with a small amount of old, light pink drainage on the gauze.   Per Palliative note there are plans to meet with the family today at the bedside.   IR recommends to continue monitoring labs and transfuse as needed.   Further plans per primary teams.   Electronically Signed: Alwyn Ren, AGACNP-BC 10/31/2023, 1:47 PM   I spent a total of 15 Minutes at the the patient's bedside AND on the patient's hospital floor or unit, greater than 50% of which was counseling/coordinating care for GI bleed.

## 2023-10-31 NOTE — Consult Note (Signed)
Consultation Note Date: 10/31/2023   Patient Name: Michele House  DOB: [birth year]  MRN: [id number]  Age / Sex: 67 y.o., female  PCP: [facility] Referring Physician: Janann Colonel, MD  Reason for Consultation: Establishing goals of care   HPI/Brief Hospital Course: 67 y.o. female  with past medical history of COPD with chronic respiratory failure on 2L [place] at baseline, T2DM, HFpEF and hypertension admitted on 10/02/2023 with AMS.  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  Imaging also revealed right upper lode lung nodule  Developed respiratory distress on 12/23--found to have metapneumovirus, stabilized but unfortunately developed worsening respiratory distress and required mechanical ventilation S/p bronchoscopy 12/27 biopsy of lung nodule performed-very rare atypical cells and mostly reactive alveolar cells  Course further complicated by acute anemia and development of shock S/p EGD oozing duodenal ulcer-clipped 12/26 S/p IR embolization to duodenal artery 12/28   Self extubated 12/27 soon after required reintubation  Palliative medicine was consulted for assisting with goals of care conversations.  Subjective:  Extensive chart review has been completed prior to meeting patient including labs, vital signs, imaging, progress notes, orders, and available advanced directive documents from current and previous encounters.  Visited with [name]-daughter, [name]'s partner and David-son along with Dr. Larinda Buttery in conference room.  Introduced myself as a Publishing rights manager as a member of the palliative care team. Explained palliative medicine is specialized medical care for people living with serious illness. It focuses on providing relief from the symptoms and stress  of a serious illness. The goal is to improve quality of life for both the patient and the family.   Dr. Larinda Buttery provided medical updates. He discussed ongoing concern related to respiratory status along with all other underlying comorbid conditions.  We discussed patient's current illness and what it means in the larger context of patient's on-going co-morbidities. Natural disease trajectory and expectations at EOL were discussed.   The difference between aggressive medical intervention and comfort care was discussed.  We discussed code status and the difference between Full Code and Do Not Resuscitate. Encouraged family to consider DNR/DNI status understanding evidenced based poor outcomes in similar hospitalized patients, as the cause of the arrest is likely associated with chronic/terminal disease rather than a reversible acute cardio-pulmonary event.  Daughter-[name] agrees to DNR but Bunnie Domino struggling with accepting that decision. He requests more time to consider before making a final decision.  Received notification from nursing staff to return a call to [facility]. Spoke with Onalee Hua who shares he has made a decision and agrees to DNR, he requested assurance DNR could be revoked at any time which was given to him. Called and confirmed with Gunnar Fusi who also agrees to DNR status. Order placed to reflect family wishes.  Plan set to revisit goals of care discussions in the next 48-72 hours.   I discussed importance of continued conversations with family/support persons and all members of their medical team regarding overall plan of care and treatment options ensuring decisions are in alignment with patients goals of care.  All questions/concerns addressed. Emotional support provided to patient/family/support persons. PMT will continue to follow and support patient as needed.  Objective: Primary Diagnoses: Present on Admission:  COPD exacerbation (HCC)  Acute on chronic respiratory failure with  hypoxia (HCC)  Pneumonia  Type 2 diabetes mellitus with obesity (HCC)  Essential hypertension  Lung nodule  Vital Signs: BP (!) 126/49 (BP Location: Right Arm)   Pulse 85   Temp 98.7 F (37.1 C) (Oral)   Resp 17   Ht 5' (1.524 m)   Wt 66.5 kg   LMP 12/24/1998 Comment: prior to her hysterectomy  SpO2 99%   BMI 28.63 kg/m  Pain Scale: CPOT POSS *See Group Information*: 1-Acceptable,Awake and alert Pain Score: 0-No pain   IO: Intake/output summary:  Intake/Output Summary (Last 24 hours) at 10/31/2023 1605 Last data filed at 10/31/2023 1555 Gross per 24 hour  Intake 961.95 ml  Output 1635 ml  Net -673.05 ml    LBM: Last BM Date : 10/26/23 Baseline Weight: Weight: 74.1 kg Most recent weight: Weight: 66.5 kg       Assessment and Plan  SUMMARY OF RECOMMENDATIONS   DNR Time for outcomes Ongoing GOC  Palliative Prophylaxis:   Bowel Regimen, Delirium Protocol and Frequent Pain Assessmen  Discussed With: Nursing staff and primary team   Thank you for this consult and allowing Palliative Medicine to participate in the care of Carmelina Paddock. Palliative medicine will continue to follow and assist as needed.   Time Total: 75 minutes  Time spent includes: Detailed review of medical records (labs, imaging, vital signs), medically appropriate exam (mental status, respiratory, cardiac, skin), discussed with treatment team, counseling and educating patient, family and staff, documenting clinical information, medication management and coordination of care.   Signed by: Leeanne Deed, DNP, AGNP-C Palliative Medicine    Please contact Palliative Medicine Team phone at [phone] for questions and concerns.  For individual provider: See Loretha Stapler

## 2023-11-01 DIAGNOSIS — J9621 Acute and chronic respiratory failure with hypoxia: Secondary | ICD-10-CM | POA: Diagnosis not present

## 2023-11-01 DIAGNOSIS — Z515 Encounter for palliative care: Secondary | ICD-10-CM | POA: Diagnosis not present

## 2023-11-01 DIAGNOSIS — G9341 Metabolic encephalopathy: Secondary | ICD-10-CM | POA: Diagnosis not present

## 2023-11-01 DIAGNOSIS — J441 Chronic obstructive pulmonary disease with (acute) exacerbation: Secondary | ICD-10-CM | POA: Diagnosis not present

## 2023-11-01 LAB — CBC
HCT: 25.2 % — ABNORMAL LOW (ref 36.0–46.0)
Hemoglobin: 7.9 g/dL — ABNORMAL LOW (ref 12.0–15.0)
MCH: 30.3 pg (ref 26.0–34.0)
MCHC: 31.3 g/dL (ref 30.0–36.0)
MCV: 96.6 fL (ref 80.0–100.0)
Platelets: 314 10*3/uL (ref 150–400)
RBC: 2.61 MIL/uL — ABNORMAL LOW (ref 3.87–5.11)
RDW: 20.1 % — ABNORMAL HIGH (ref 11.5–15.5)
WBC: 7.1 10*3/uL (ref 4.0–10.5)
nRBC: 0.6 % — ABNORMAL HIGH (ref 0.0–0.2)

## 2023-11-01 LAB — BPAM RBC
Blood Product Expiration Date: 202412312359
Blood Product Expiration Date: 202412312359
Blood Product Expiration Date: 202501252359
Blood Product Expiration Date: 202501272359
Blood Product Unit Number: 202412312359
ISSUE DATE / TIME: 202412261902
ISSUE DATE / TIME: 202412261902
ISSUE DATE / TIME: 202412272004
ISSUE DATE / TIME: 202412281208
ISSUE DATE / TIME: 202412312359
PRODUCT CODE: 202412290621
Unit Type and Rh: 202412312359
Unit Type and Rh: 202412312359
Unit Type and Rh: 202412312359
Unit Type and Rh: 202412312359
Unit Type and Rh: 9500
Unit Type and Rh: 9500
Unit Type and Rh: 9500
Unit Type and Rh: 9500
Unit Type and Rh: 9500
Unit Type and Rh: 9500

## 2023-11-01 LAB — TYPE AND SCREEN
ABO/RH(D): O NEG
Antibody Screen: NEGATIVE
Unit division: 0
Unit division: 0
Unit division: 0
Unit division: 0
Unit division: 0
Unit division: 0
Unit division: 0
Unit division: 0
Unit division: 0
Unit division: 0
Unit division: 0

## 2023-11-01 LAB — BASIC METABOLIC PANEL
Anion gap: 6 (ref 5–15)
BUN: 24 mg/dL — ABNORMAL HIGH (ref 8–23)
CO2: 30 mmol/L (ref 22–32)
Calcium: 7.9 mg/dL — ABNORMAL LOW (ref 8.9–10.3)
Chloride: 108 mmol/L (ref 98–111)
Creatinine, Ser: 0.75 mg/dL (ref 0.44–1.00)
GFR, Estimated: >60 mL/min (ref >60)
Glucose, Bld: 104 mg/dL — ABNORMAL HIGH (ref 70–99)
Potassium: 2.9 mmol/L — ABNORMAL LOW (ref 3.5–5.1)
Sodium: 144 mmol/L (ref 135–145)

## 2023-11-01 LAB — GLUCOSE, CAPILLARY
Glucose-Capillary: 100 mg/dL — ABNORMAL HIGH (ref 70–99)
Glucose-Capillary: 124 mg/dL — ABNORMAL HIGH (ref 70–99)
Glucose-Capillary: 151 mg/dL — ABNORMAL HIGH (ref 70–99)
Glucose-Capillary: 57 mg/dL — ABNORMAL LOW (ref 70–99)
Glucose-Capillary: 60 mg/dL — ABNORMAL LOW (ref 70–99)
Glucose-Capillary: 80 mg/dL (ref 70–99)
Glucose-Capillary: 80 mg/dL (ref 70–99)
Glucose-Capillary: 82 mg/dL (ref 70–99)
Glucose-Capillary: 90 mg/dL (ref 70–99)

## 2023-11-01 LAB — MISC LABCORP TEST (SEND OUT): Labcorp test code: 9985

## 2023-11-01 LAB — TRIGLYCERIDES: Triglycerides: 255 mg/dL — ABNORMAL HIGH (ref <150)

## 2023-11-01 LAB — MAGNESIUM: Magnesium: 2.4 mg/dL (ref 1.7–2.4)

## 2023-11-01 LAB — PREPARE RBC (CROSSMATCH)

## 2023-11-01 MED ORDER — ACETAMINOPHEN 325 MG PO TABS
650.0000 mg | ORAL_TABLET | Freq: Four times a day (QID) | ORAL | Status: DC | PRN
  Administered 2023-11-02 – 2023-11-29 (×6): 650 mg via ORAL
  Filled 2023-11-01 (×6): qty 2

## 2023-11-01 MED ORDER — DEXTROSE 50 % IV SOLN
12.5000 g | Freq: Once | INTRAVENOUS | Status: AC
  Administered 2023-11-01: 12.5 g via INTRAVENOUS

## 2023-11-01 MED ORDER — FUROSEMIDE 10 MG/ML IJ SOLN
40.0000 mg | Freq: Once | INTRAMUSCULAR | Status: AC
  Administered 2023-11-01: 40 mg via INTRAVENOUS
  Filled 2023-11-01: qty 4

## 2023-11-01 MED ORDER — INSULIN ASPART 100 UNIT/ML IJ SOLN
0.0000 [IU] | INTRAMUSCULAR | Status: DC
  Administered 2023-11-01: 1 [IU] via SUBCUTANEOUS
  Administered 2023-11-02: 3 [IU] via SUBCUTANEOUS
  Administered 2023-11-02: 2 [IU] via SUBCUTANEOUS
  Administered 2023-11-02 (×2): 1 [IU] via SUBCUTANEOUS
  Administered 2023-11-03: 5 [IU] via SUBCUTANEOUS
  Administered 2023-11-03 (×2): 3 [IU] via SUBCUTANEOUS
  Filled 2023-11-01 (×8): qty 1

## 2023-11-01 MED ORDER — DOCUSATE SODIUM 50 MG/5ML PO LIQD
100.0000 mg | Freq: Two times a day (BID) | ORAL | Status: DC
  Filled 2023-11-01: qty 10

## 2023-11-01 MED ORDER — POLYETHYLENE GLYCOL 3350 17 G PO PACK
17.0000 g | PACK | Freq: Every day | ORAL | Status: DC
  Administered 2023-11-03 – 2023-12-02 (×22): 17 g via ORAL
  Filled 2023-11-01 (×28): qty 1

## 2023-11-01 MED ORDER — POTASSIUM CHLORIDE 10 MEQ/50ML IV SOLN
10.0000 meq | INTRAVENOUS | Status: AC
  Administered 2023-11-01 (×4): 10 meq via INTRAVENOUS
  Filled 2023-11-01 (×4): qty 50

## 2023-11-01 MED ORDER — SERTRALINE HCL 50 MG PO TABS
50.0000 mg | ORAL_TABLET | Freq: Every day | ORAL | Status: DC
  Administered 2023-11-02 – 2023-12-02 (×31): 50 mg via ORAL
  Filled 2023-11-01 (×31): qty 1

## 2023-11-01 MED ORDER — IPRATROPIUM-ALBUTEROL 0.5-2.5 (3) MG/3ML IN SOLN
3.0000 mL | Freq: Four times a day (QID) | RESPIRATORY_TRACT | Status: DC
  Administered 2023-11-01 – 2023-11-03 (×9): 3 mL via RESPIRATORY_TRACT
  Filled 2023-11-01 (×9): qty 3

## 2023-11-01 MED ORDER — POTASSIUM CHLORIDE 20 MEQ PO PACK
20.0000 meq | PACK | Freq: Two times a day (BID) | ORAL | Status: DC
Start: 2023-11-01 — End: 2023-11-01
  Administered 2023-11-01: 20 meq via ORAL
  Filled 2023-11-01 (×2): qty 1

## 2023-11-01 MED ORDER — DOCUSATE SODIUM 100 MG PO CAPS
100.0000 mg | ORAL_CAPSULE | Freq: Two times a day (BID) | ORAL | Status: DC
  Administered 2023-11-01 – 2023-12-02 (×52): 100 mg via ORAL
  Filled 2023-11-01 (×58): qty 1

## 2023-11-01 MED ORDER — DEXTROSE 50 % IV SOLN
25.0000 mL | Freq: Once | INTRAVENOUS | Status: AC
  Administered 2023-11-01: 25 mL via INTRAVENOUS
  Filled 2023-11-01: qty 50

## 2023-11-01 MED ORDER — SENNOSIDES-DOCUSATE SODIUM 8.6-50 MG PO TABS
1.0000 | ORAL_TABLET | Freq: Every day | ORAL | Status: DC
  Administered 2023-11-03 – 2023-12-02 (×24): 1 via ORAL
  Filled 2023-11-01 (×27): qty 1

## 2023-11-01 MED ORDER — HYDROXYZINE HCL 50 MG PO TABS
25.0000 mg | ORAL_TABLET | Freq: Three times a day (TID) | ORAL | Status: DC | PRN
  Administered 2023-11-02 – 2023-12-01 (×13): 25 mg via ORAL
  Filled 2023-11-01 (×13): qty 1

## 2023-11-01 MED ORDER — ORAL CARE MOUTH RINSE
15.0000 mL | Freq: Two times a day (BID) | OROMUCOSAL | Status: DC
  Administered 2023-11-02 – 2023-12-02 (×49): 15 mL via OROMUCOSAL

## 2023-11-01 MED ORDER — POTASSIUM CHLORIDE CRYS ER 20 MEQ PO TBCR
20.0000 meq | EXTENDED_RELEASE_TABLET | Freq: Two times a day (BID) | ORAL | Status: AC
  Administered 2023-11-01: 20 meq via ORAL
  Filled 2023-11-01: qty 1

## 2023-11-01 NOTE — Progress Notes (Signed)
Pt successfully extubated to 2L, per MD order. Sats are 95% at this time.

## 2023-11-01 NOTE — Inpatient Diabetes Management (Signed)
Inpatient Diabetes Program Recommendations  AACE/ADA: New Consensus Statement on Inpatient Glycemic Control (2015)  Target Ranges:  Prepandial:   less than 140 mg/dL      Peak postprandial:   less than 180 mg/dL (1-2 hours)      Critically ill patients:  140 - 180 mg/dL   Lab Results  Component Value Date   GLUCAP 90 11/01/2023   HGBA1C 8.4 (H) 10/15/2023    Latest Reference Range & Units 10/31/23 14:30 10/31/23 15:39 10/31/23 17:36 10/31/23 18:39 10/31/23 19:46 10/31/23 21:20 10/31/23 21:21 10/31/23 22:37 10/31/23 23:42  Glucose-Capillary 70 - 99 mg/dL 409 (H) 811 (H) 77 78 72 61 (L) 56 (L) 110 (H) 92  (H): Data is abnormally high (L): Data is abnormally low Diabetes history: Type 2 DM Outpatient Diabetes medications: Glipizide 10 mg every day, Tradjenta 5 mg QD Current orders for Inpatient glycemic control: Novolog 0-20 units q 4 hrs, Semglee 18 units at bedtime Solumedrol 40 mg QD  Inpatient Diabetes Program Recommendations:   Please consider: -Decrease Novolog correction to 0-9 units q 4 hrs. -Decrease Semglee to 15 units daily (held today due to hypoglycemia)  Thank you, Billy Fischer. Johnice Riebe, RN, MSN, CDCES  Diabetes Coordinator Inpatient Glycemic Control Team Team Pager [phone] (8am-5pm) 11/01/2023 1:35 PM

## 2023-11-01 NOTE — Plan of Care (Signed)
  Problem: Education: Goal: Ability to describe self-care measures that may prevent or decrease complications (Diabetes Survival Skills Education) will improve Outcome: Progressing   Problem: Education: Goal: Knowledge of disease or condition will improve Outcome: Progressing   Problem: Ischemic Stroke/TIA Tissue Perfusion: Goal: Complications of ischemic stroke/TIA will be minimized Outcome: Progressing   Problem: Coping: Goal: Will verbalize positive feelings about self Outcome: Progressing   Problem: Self-Care: Goal: Verbalization of feelings and concerns over difficulty with self-care will improve Outcome: Progressing

## 2023-11-01 NOTE — Consult Note (Signed)
PHARMACY CONSULT NOTE - ELECTROLYTES  Pharmacy Consult for Electrolyte Monitoring and Replacement   Recent Labs: Height: 5' (152.4 cm) Weight: 66.5 kg (146 lb 9.7 oz) IBW/kg (Calculated) : 45.5 Estimated Creatinine Clearance: 56.5 mL/min (by C-G formula based on SCr of 0.75 mg/dL). Potassium (mmol/L)  Date Value  11/01/2023 2.9 (L)  09/30/2013 3.7   Magnesium (mg/dL)  Date Value  62/13/0865 2.4   Calcium (mg/dL)  Date Value  78/46/9629 7.9 (L)   Calcium, Total (mg/dL)  Date Value  52/84/1324 9.2   Albumin (g/dL)  Date Value  40/08/2724 2.7 (L)  09/30/2013 3.7   Phosphorus (mg/dL)  Date Value  36/64/4034 3.0   Sodium (mmol/L)  Date Value  11/01/2023 144  09/30/2013 136    Assessment  Michele House is a 67 y.o. female presenting with shortness of breath. PMH significant for COPD, chronic respiratory failure on 2 L, hypertension, type 2 diabetes, HFpEF. Recent discharge from the hospital 24th November 2024 after hospitalization for COPD exacerbation complicated by respiratory failure. She presented to the hospital again on 10/02/2023 with chest pain, malaise, cough, shortness of breath, abdominal pain and dysuria. Pharmacy has been consulted to monitor and replace electrolytes.  Goal of Therapy: Electrolytes WNL  Plan:  K 2.9, Kcl 20 mEq per tube BID x 2 doses and Kcl 10 mEq IV q1h x 4 doses Will check all electrolytes with AM labs  Thank you for allowing pharmacy to be a part of this patient's care.  Tressie Ellis 11/01/2023 7:59 AM

## 2023-11-01 NOTE — Progress Notes (Signed)
Initial Nutrition Assessment  DOCUMENTATION CODES:   Not applicable  INTERVENTION:   If pt does not extubate, recommend:  Vital 1.2@50ml /hr- Initiate at 81ml/hr and increase by 20ml/hr q 8 hours until goal rate is reached.   Free water flushes q4 hours  Regimen provides 1440kcal/day, 90g/day protein and 1515ml/day of free water.   Pt at high refeed risk; recommend monitor potassium, magnesium and phosphorus labs daily until stable  Daily weights   NUTRITION DIAGNOSIS:   Inadequate oral intake related to inability to eat (pt sedated and ventilated) as evidenced by NPO status.  GOAL:   Provide needs based on ASPEN/SCCM guidelines  MONITOR:   Vent status, Labs, Weight trends, I & O's, Skin  REASON FOR ASSESSMENT:   Ventilator    ASSESSMENT:   67 y/o female with h/o HTN, HLD, COPD, CHF, thyroid disease, lung nodule and GERD who is admitted with COPD/asthma exacerbation in the setting of metapneumovirus infection, acute anemia with hemorrhagic shock secondary to duodenal ulcer s/p EGD with clip and Injection 10/28/2023 and s/p IR Embolization on 10/30/2023, AKI and acute CVA with right sided hemiparesis.  Pt sedated and ventilated. OGT in place to LIS with output. No further GIB noted. Pt undergoing SBTs today. Will plan to initiate tube feeds if pt does not extubate. Pt documented to have eaten 75% of meals with total assist prior to intubation on 12/26. Pt has remained NPO since 12/26. Pt is at high refeed risk. No BM noted since 12/24. Per chart, pt is down 17lbs(10%) since admission if her bed weights are correct; this would be significant weight loss. Pt -3.1L on her I & Os.    Medications reviewed and include: colace, insulin, synthroid, solu-medrol, protonix, miralax, Kcl, senokot, ceftriaxone, propofol   Labs reviewed: K 2.9(L), BUN 24(H), Mg 2.4 wnl P 3.0 wnl- 12/28 Hgb 7.9(L), Hct 25.2(L) Cbgs- 90, 82, 80, 57, 80 x 24 hrs  AIC 8.4(H)- 12/13  Patient  is currently intubated on ventilator support MV: 6.0 L/min Temp (24hrs), Avg:98.2 F (36.8 C), Min:97.6 F (36.4 C), Max:98.7 F (37.1 C)  Propofol: 5.99 ml/hr- provides 158kcal/day   MAP- >67mmHg   UOP-   NUTRITION - FOCUSED PHYSICAL EXAM:  Flowsheet Row Most Recent Value  Orbital Region No depletion  Upper Arm Region No depletion  Thoracic and Lumbar Region No depletion  Buccal Region No depletion  Temple Region No depletion  Clavicle Bone Region No depletion  Clavicle and Acromion Bone Region No depletion  Scapular Bone Region No depletion  Dorsal Hand No depletion  Patellar Region Moderate depletion  Anterior Thigh Region Moderate depletion  Posterior Calf Region Moderate depletion  Edema (RD Assessment) Mild  Hair Reviewed  Eyes Reviewed  Mouth Reviewed  Skin Reviewed  Nails Reviewed   Diet Order:   Diet Order     None      EDUCATION NEEDS:   No education needs have been identified at this time  Skin:  Skin Assessment: Reviewed RN Assessment (ecchymosis)  Last BM:  12/24- type 5  Height:   Ht Readings from Last 1 Encounters:  10/31/23 5' (1.524 m)    Weight:   Wt Readings from Last 1 Encounters:  10/28/23 66.5 kg    Ideal Body Weight:  45.45 kg  BMI:  Body mass index is 28.63 kg/m.  Estimated Nutritional Needs:   Kcal:  1241kcal/day  Protein:  90-100g/day  Fluid:  1.4-1.6L/day  Betsey Holiday MS, RD, LDN If unable to be  reached, please send secure chat to "RD inpatient" available from 8:00a-4:00p daily

## 2023-11-01 NOTE — Progress Notes (Signed)
NAME:  Michele House, MRN:  [id number], DOB:  09/17/1956, LOS: 30 ADMISSION DATE:  10/02/2023   History of Present Illness:  Case of a 67 year old female patient with a past medical history of COPD with chronic respiratory failure on 2 L nasal cannula, hypertension, type 2 diabetes, heart failure with preserved EF who has been here for a month.  Initially presented with altered mental status encephalopathy.  Had an extensive workup including brain MRI which was negative for any acute intracranial abnormality initially.  However unfortunately course complicated by acute CVA with repeat MRI showing multiple acute infarcts in the high bilateral posterior frontal and parietal lobes.  She recovered from that but course further complicated by waxing and waning mental status which prompted an LP to rule out encephalitis and has been unremarkable so far.  She was found to have right upper lobe lung nodule which was concerning for malignancy and underwent robotic assisted navigational bronchoscopy with biopsy on 12/27 which showed very rare atypical cells and reactive alveolar cells mostly.    Pertinent  Medical History  As abnove  Significant Hospital Events: Including procedures, antibiotic start and stop dates in addition to other pertinent events   12/01: Vital stable, respiratory viral panel negative, procalcitonin negative, preliminary blood cultures negative.  Urine cultures are ordered as add-on, ammonia levels mildly elevated at 39, strep pneumo negative, CBG elevated at 244, history of enlarging pulmonary nodule with recommendations for PET/CT during most recent admission which has not been done yet, if video bronchoscopy was scheduled for 10/13/2023 as outpatient. 12/02: Unable to take care of herself and son cannot provide the required care as she required 24-hour supervision due to worsening dementia.  PT is recommending SNF. 12/03: Blood pressure started trending up, restarting home  Zestoretic.  Urine cultures with strep agalactiae, penicillin allergy noted, will continue with ceftriaxone for now.  Also started on Remeron for concern of worsening depression and unable to sleep at night  12/05:  Patient has some blurry vision and weakness and pain.  MRI of the brain negative for acute intracranial abnormality. 12/08:  Patient more confused today than yesterday.  Able to follow some simple commands but not as good of a conversation today as yesterday. 12/09:  Patient still confused today.  Does not follow simple commands today. 12/10: Patient more talkative today but was looking up at the ceiling when she was talking with me.  She does not move her extremities to command but does move them on her own.  Physical therapy and Occupational Therapy did not see her move her right arm. 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  Mild associated petechial hemorrhage.  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 12/12: Patient with new stroke, CTA head and neck with no large vessel occlusion.  Patient had a recent echocardiogram which was normal. Concern of paraneoplastic versus hypercoagulability secondary to malignancy.  Patient missed her appointment for bronchoscopy and outpatient appointment for PET scan due to recurrent hospitalizations. 12/13: Patient continued to have waxing and waning mental status, unable to move right upper and lower extremities.  Having some hallucination.  Pulmonary is planning lung biopsy on 12/27 if she remains stable.  Rapidly progressive dementia and now with underlying stroke, question of paraneoplastic encephalitis and hypercoagulability secondary to underlying undiagnosed malignancy. 12/15: Patient with much  improved mentation.  Having some flickering of movements on right  upper and lower extremity today, left extremities weaker but seems improving.   12/18: Pt underwent bronchoscopy and lung biopsy 12/19: patient stable , she is being optimized for dc for rehab.  She's cleared from pulmonary for discharge. Results from bronch will take 1 week.   12/26: Pt transferred to the stepdown unit with acute hypercapnic respiratory requiring Bipap but subsequently required mechanical intubation due to worsening respiratory failure.  Insulin gtt initiated due to severe hyperglycemia.  With hemorrhagic shock due to bleeding duodenal ulcer, underwent emergent EGD with successful hemostasis by injection of Epinephrine and hemostatic clip. 12/27: Hgb slowly trending down, no report of bleeding from OG suction nor melena. Weaning down vasopressors, if able to continue to wean vasopressors, then can consider WUA/SBT.  Giving temporizing measures for Hyperkalemia. Self extubated but required reintubation. Hgb continued to trend down, given 1 unit pRBC's and 2 FFP.  IR consulted for consideration for embolization. 12/28: Weaned off Vasopressin, Levophed weaning down.  S/p IR embolization of the Duodenal artery. Peak pressures and lung sounds improved with Ketamine, will keep intubated today given rapid reintubation yesterday. 12/30- patient on PRVC FiO2 28%, weaning off ketamine and hoping to perform SBT today.  Holding feeds until post SBT.   Interim History / Subjective:  Remains Intubated with significant wheezing and chest tightness.   Objective   Blood pressure 118/89, pulse 68, temperature 98.1 F (36.7 C), temperature source Axillary, resp. rate 12, height 5' (1.524 m), weight 66.5 kg, last menstrual period 12/24/1998, SpO2 97%.    Vent Mode: PRVC FiO2 (%):  [28 %] 28 % Set Rate:  [14 bmp] 14 bmp Vt Set:  [450 mL] 450 mL PEEP:  [5 cmH20] 5 cmH20   Intake/Output Summary (Last 24 hours) at 11/01/2023 0739 Last data filed at 11/01/2023 0700 Gross per 24 hour  Intake 826.45  ml  Output 1420 ml  Net -593.55 ml   Filed Weights   10/05/23 0912 10/20/23 0732 10/28/23 1431  Weight: 74.1 kg 74.1 kg 66.5 kg    Examination: General: Awake, follows simple commands  HENT: Supple neck reactive pupils  Lungs: Diffuse wheezing and diminished air movement.  Cardiovascular: Normal S1, Normal S2, RRR  Abdomen: Soft, non tender, non distended, +BS  Extremities: Warm and well perfused no edema.   Labs and imaging were reviewed.   Assessment & Plan:  Case of a 67 year old female patient presenting 4 weeks ago with confusion and altered mental status with course c/b AHRF requring intubation 12/26 likely secondary to metapneumovirus and COPD exacerbation. Course complicated by hemorrhagic shock from a duodenal ulcer s/p EGD 12/26 with clip placed s/p IR embolization 10/30/2023. Remains intubated with significant wheezing and chest tightness.   #Acute hypoxic respiratory failure secondary to #COPD/asthma exacerbation in the setting of metapneumovirus infection #Acute anemia with hemorrhagic shock secondary to Duodenal ulcer s.p EGD clip and Injection 10/28/2023, s/p IR Embolization on 10/30/2023 #Metabolic encephalopathy #Acute CVA during this admission with right sided hemiparesis #AKI with a creatinine of 1.19 from a baseline of 0.8 mg/dL.     CNS sedation with Propofol for RASS 0 to -1. Ketamine drip Pulmonary methylprednisolone IV 40 mg every 24 hours.  Albuterol 7.5mg  x1 DuoNebs scheduled every 3 hours.  Keep respiratory rate at 14 for ventilation.  Peak pressure in the 30 and low 20s . Ceftriaxone 1 g for 5 days.  CVS norepinephrine for MAP greater than 65.  Renal  monitor UOP.  Resuscitation as above.  Trend creatinine daily. Heme Discontinue Plavix. IV PPI BID. H&H at 18:00 Endo Long acting insulin and ISS.    Best Practice (right click and "Reselect all SmartList Selections" daily)   Diet/type: NPO DVT prophylaxis SCD Pressure ulcer(s): N/A GI prophylaxis:  PPI Lines: Central line Foley:  Yes, and it is still needed Code Status:  full code   Critical care provider statement:   Total critical care time: 62 minutes   Performed by: Karna Christmas MD   Critical care time was exclusive of separately billable procedures and treating other patients.   Critical care was necessary to treat or prevent imminent or life-threatening deterioration.   Critical care was time spent personally by me on the following activities: development of treatment plan with patient and/or surrogate as well as nursing, discussions with consultants, evaluation of patient's response to treatment, examination of patient, obtaining history from patient or surrogate, ordering and performing treatments and interventions, ordering and review of laboratory studies, ordering and review of radiographic studies, pulse oximetry and re-evaluation of patient's condition.    Vida Rigger, M.D.  Pulmonary & Critical Care Medicine

## 2023-11-01 NOTE — Progress Notes (Signed)
   11/01/23 1200  Spiritual Encounters  Type of Visit Initial  Care provided to: Patient  Conversation partners present during encounter Nurse  Referral source Chaplain assessment  Reason for visit Routine spiritual support  OnCall Visit No  Spiritual Framework  Presenting Themes Courage hope and growth;Coping tools;Rituals and practive  Community/Connection Family  Patient Stress Factors Health changes  Interventions  Spiritual Care Interventions Made Established relationship of care and support;Reflective listening;Compassionate presence;Prayer  Intervention Outcomes  Outcomes Connection to spiritual care;Awareness around self/spiritual resourses;Connection to values and goals of care  Spiritual Care Plan  Spiritual Care Issues Still Outstanding Chaplain will continue to follow   Chaplain received a call from receptionist to go see patient and her family. Chaplain arrived and spoke with patients son who wanted prayer for his mom. Chaplain offered words of hope and prayed with patient and her son. Chaplain informed him that chaplain services is available for emotional and spiritual support.

## 2023-11-01 NOTE — Progress Notes (Signed)
Hypoglycemic Event  CBG: 60  Treatment: D50 25 mL (12.5 gm)  Symptoms: None  Follow-up CBG: Time:1600 CBG Result:151  Possible Reasons for Event: Inadequate meal intake.     Michele House

## 2023-11-02 DIAGNOSIS — M79604 Pain in right leg: Secondary | ICD-10-CM | POA: Diagnosis not present

## 2023-11-02 DIAGNOSIS — G9341 Metabolic encephalopathy: Secondary | ICD-10-CM | POA: Diagnosis not present

## 2023-11-02 DIAGNOSIS — I5032 Chronic diastolic (congestive) heart failure: Secondary | ICD-10-CM | POA: Diagnosis not present

## 2023-11-02 DIAGNOSIS — J441 Chronic obstructive pulmonary disease with (acute) exacerbation: Secondary | ICD-10-CM | POA: Diagnosis not present

## 2023-11-02 LAB — BASIC METABOLIC PANEL
Anion gap: 6 (ref 5–15)
BUN: 21 mg/dL (ref 8–23)
CO2: 34 mmol/L — ABNORMAL HIGH (ref 22–32)
Calcium: 8.2 mg/dL — ABNORMAL LOW (ref 8.9–10.3)
Chloride: 105 mmol/L (ref 98–111)
Creatinine, Ser: 0.68 mg/dL (ref 0.44–1.00)
GFR, Estimated: >60 mL/min (ref >60)
Glucose, Bld: 66 mg/dL — ABNORMAL LOW (ref 70–99)
Potassium: 3.4 mmol/L — ABNORMAL LOW (ref 3.5–5.1)
Sodium: 145 mmol/L (ref 135–145)

## 2023-11-02 LAB — CBC
HCT: 25.4 % — ABNORMAL LOW (ref 36.0–46.0)
Hemoglobin: 8.1 g/dL — ABNORMAL LOW (ref 12.0–15.0)
MCH: 30.1 pg (ref 26.0–34.0)
MCHC: 31.9 g/dL (ref 30.0–36.0)
MCV: 94.4 fL (ref 80.0–100.0)
Platelets: 243 10*3/uL (ref 150–400)
RBC: 2.69 MIL/uL — ABNORMAL LOW (ref 3.87–5.11)
RDW: 20.3 % — ABNORMAL HIGH (ref 11.5–15.5)
WBC: 5.2 10*3/uL (ref 4.0–10.5)
nRBC: 0 % (ref 0.0–0.2)

## 2023-11-02 LAB — GLUCOSE, CAPILLARY
Glucose-Capillary: 142 mg/dL — ABNORMAL HIGH (ref 70–99)
Glucose-Capillary: 148 mg/dL — ABNORMAL HIGH (ref 70–99)
Glucose-Capillary: 171 mg/dL — ABNORMAL HIGH (ref 70–99)
Glucose-Capillary: 196 mg/dL — ABNORMAL HIGH (ref 70–99)
Glucose-Capillary: 220 mg/dL — ABNORMAL HIGH (ref 70–99)
Glucose-Capillary: 224 mg/dL — ABNORMAL HIGH (ref 70–99)
Glucose-Capillary: 53 mg/dL — ABNORMAL LOW (ref 70–99)
Glucose-Capillary: 57 mg/dL — ABNORMAL LOW (ref 70–99)
Glucose-Capillary: 91 mg/dL (ref 70–99)

## 2023-11-02 LAB — CULTURE, BLOOD (ROUTINE X 2)
Culture: NO GROWTH
Culture: NO GROWTH
Special Requests: ADEQUATE

## 2023-11-02 LAB — MAGNESIUM: Magnesium: 2.1 mg/dL (ref 1.7–2.4)

## 2023-11-02 LAB — PHOSPHORUS: Phosphorus: 1.8 mg/dL — ABNORMAL LOW (ref 2.5–4.6)

## 2023-11-02 MED ORDER — PREDNISONE 20 MG PO TABS
40.0000 mg | ORAL_TABLET | Freq: Every day | ORAL | Status: AC
  Administered 2023-11-05: 40 mg via ORAL
  Filled 2023-11-02: qty 2

## 2023-11-02 MED ORDER — ENSURE ENLIVE PO LIQD
237.0000 mL | Freq: Two times a day (BID) | ORAL | Status: DC
  Administered 2023-11-02 – 2023-11-07 (×7): 237 mL via ORAL

## 2023-11-02 MED ORDER — POTASSIUM PHOSPHATES 15 MMOLE/5ML IV SOLN
30.0000 mmol | Freq: Once | INTRAVENOUS | Status: DC
  Filled 2023-11-02: qty 10

## 2023-11-02 MED ORDER — PREDNISONE 10 MG PO TABS
15.0000 mg | ORAL_TABLET | Freq: Every day | ORAL | Status: AC
  Administered 2023-11-10: 15 mg via ORAL
  Filled 2023-11-02: qty 2

## 2023-11-02 MED ORDER — DEXTROSE 50 % IV SOLN
25.0000 g | INTRAVENOUS | Status: AC
  Administered 2023-11-02: 25 g via INTRAVENOUS
  Filled 2023-11-02: qty 50

## 2023-11-02 MED ORDER — PREDNISONE 20 MG PO TABS
20.0000 mg | ORAL_TABLET | Freq: Every day | ORAL | Status: AC
  Administered 2023-11-09: 20 mg via ORAL
  Filled 2023-11-02: qty 1

## 2023-11-02 MED ORDER — PREDNISONE 10 MG PO TABS
10.0000 mg | ORAL_TABLET | Freq: Every day | ORAL | Status: AC
  Administered 2023-11-11: 10 mg via ORAL
  Filled 2023-11-02: qty 1

## 2023-11-02 MED ORDER — PANTOPRAZOLE SODIUM 40 MG PO TBEC
40.0000 mg | DELAYED_RELEASE_TABLET | Freq: Two times a day (BID) | ORAL | Status: DC
  Administered 2023-11-02 – 2023-11-10 (×16): 40 mg via ORAL
  Filled 2023-11-02 (×16): qty 1

## 2023-11-02 MED ORDER — PREDNISONE 20 MG PO TABS
45.0000 mg | ORAL_TABLET | Freq: Every day | ORAL | Status: AC
  Administered 2023-11-04: 45 mg via ORAL
  Filled 2023-11-02: qty 1

## 2023-11-02 MED ORDER — PREDNISONE 20 MG PO TABS
30.0000 mg | ORAL_TABLET | Freq: Every day | ORAL | Status: AC
  Administered 2023-11-07: 30 mg via ORAL
  Filled 2023-11-02: qty 1

## 2023-11-02 MED ORDER — POTASSIUM PHOSPHATES 15 MMOLE/5ML IV SOLN
30.0000 mmol | Freq: Once | INTRAVENOUS | Status: AC
  Administered 2023-11-02: 30 mmol via INTRAVENOUS
  Filled 2023-11-02: qty 10

## 2023-11-02 MED ORDER — PREDNISONE 50 MG PO TABS
25.0000 mg | ORAL_TABLET | Freq: Every day | ORAL | Status: AC
  Administered 2023-11-08: 25 mg via ORAL
  Filled 2023-11-02: qty 1

## 2023-11-02 MED ORDER — PREDNISONE 20 MG PO TABS
50.0000 mg | ORAL_TABLET | Freq: Every day | ORAL | Status: AC
  Administered 2023-11-03: 50 mg via ORAL
  Filled 2023-11-02: qty 1

## 2023-11-02 MED ORDER — ADULT MULTIVITAMIN W/MINERALS CH
1.0000 | ORAL_TABLET | Freq: Every day | ORAL | Status: DC
  Administered 2023-11-02 – 2023-12-02 (×31): 1 via ORAL
  Filled 2023-11-02 (×31): qty 1

## 2023-11-02 MED ORDER — INSULIN GLARGINE-YFGN 100 UNIT/ML ~~LOC~~ SOLN
10.0000 [IU] | Freq: Every day | SUBCUTANEOUS | Status: DC
  Administered 2023-11-02 – 2023-11-09 (×8): 10 [IU] via SUBCUTANEOUS
  Filled 2023-11-02 (×8): qty 0.1

## 2023-11-02 MED ORDER — POTASSIUM CHLORIDE 10 MEQ/50ML IV SOLN
10.0000 meq | INTRAVENOUS | Status: AC
  Administered 2023-11-02 (×2): 10 meq via INTRAVENOUS
  Filled 2023-11-02 (×2): qty 50

## 2023-11-02 MED ORDER — PREDNISONE 50 MG PO TABS
35.0000 mg | ORAL_TABLET | Freq: Every day | ORAL | Status: AC
  Administered 2023-11-06: 35 mg via ORAL
  Filled 2023-11-02: qty 1

## 2023-11-02 NOTE — Plan of Care (Signed)
  Problem: Ischemic Stroke/TIA Tissue Perfusion: Goal: Complications of ischemic stroke/TIA will be minimized Outcome: Progressing   Problem: Education: Goal: Knowledge of secondary prevention will improve (MUST DOCUMENT ALL) Outcome: Progressing Goal: Knowledge of patient specific risk factors will improve Alonso N/A or DELETE if not current risk factor) Outcome: Progressing   Problem: Ischemic Stroke/TIA Tissue Perfusion: Goal: Complications of ischemic stroke/TIA will be minimized Outcome: Progressing

## 2023-11-02 NOTE — Progress Notes (Signed)
 Palliative Care Progress Note, Assessment & Plan   Patient Name: Michele House       Date: 11/02/2023 DOB: 13-Apr-1956  Age: 67 y.o. MRN#: [id number] Attending Physician: Parris Manna, MD Primary Care Physician: [facility] Admit Date: 10/02/2023  Subjective: Patient is awake, alert, and sitting up in bed.  She acknowledges my presence and is able to make her wishes known.  No family or friends present during my visit.  HPI: 68 y.o. female  with past medical history of COPD with chronic respiratory failure on 2L [place] at baseline, T2DM, HFpEF and hypertension admitted on 10/02/2023 with AMS.   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   Imaging also revealed right upper lode lung nodule   Developed respiratory distress on 12/23--found to have metapneumovirus, stabilized but unfortunately developed worsening respiratory distress and required mechanical ventilation S/p bronchoscopy 12/27 biopsy of lung nodule performed-very rare atypical cells and mostly reactive alveolar cells   Course further complicated by acute anemia and development of shock S/p EGD oozing duodenal ulcer-clipped 12/26 S/p IR embolization to duodenal artery 12/28    Self extubated 12/27 soon after required reintubation   Palliative medicine was consulted for assisting with goals of care conversations.  Summary of counseling/coordination of care: Extensive chart review completed prior to meeting patient including labs, vital signs, imaging, progress notes, orders, and available advanced directive documents from current and previous encounters.    After reviewing the patient's chart and assessing the patient at bedside, I spoke with patient in regards to symptom management and goals of care.  Symptoms assessed.  Patient shares she feels well.  She is happy to be off of the BiPAP.  She states she never wants to go back on those breathing things again.  As patient to further elaborate on what she means.  She shares that she would never wish 3 hours on the ventilator on even her worst enemy.  She says she hates that never wants to go back on it.  Boundaries to medical treatments discussed.  Patient endorses that she would never be accepting of ventilatory support ever again.  Discussed that her CODE STATUS currently remains for a DNR with full interventions.  Discussed difference between DNR with full interventions and limited interventions.  Patient endorses that she would never want to be placed back on the ventilator.  Discussed patient's mobility.  Given her recent stroke, patient has right upper and right lower extremity weakness.  She shares she is upset that her independence has been taken from her.  She shares she is used to doing things for herself and does not like that she cannot take care of herself anymore.  Discussed that PT and OT will continue to work with her to see what her mobility and abilities are in her right side.  She shares she never wants to be a burden on her family.  Therapeutic silence, active listening, and emotional support provided.  In light of patient's comments regarding never wanting to be on a ventilator again, and with patient's permission, I spoke  with her daughter over the phone.  Conveyed the above discussion.  Daughter endorses she wants what ever her mother wants.  She is so happy that her mother is awake and alert and able to make her wishes known.  She is in agreement with DNR with limited interventions.  She would never want her mother to suffer and believes that it is own version of trauma to be placed  on ventilatory support.  I discussed with patient and daughter that we will revisit patient's CODE STATUS and complete a MOST form tomorrow-changing patient's CODE STATUS to DNR with limited interventions.  I have purposely not changed her CODE STATUS today as I want to ensure that patient remembers our discussion and has full capacity to make medical decisions independently.  No adjustment to plan of care at this time.  PMT will continue to follow and support patient and family throughout her hospitalization.  Physical Exam Vitals reviewed.  Constitutional:      General: She is not in acute distress.    Appearance: She is normal weight. She is not ill-appearing.  HENT:     Head: Normocephalic.  Eyes:     Pupils: Pupils are equal, round, and reactive to light.  Cardiovascular:     Rate and Rhythm: Normal rate.  Abdominal:     Palpations: Abdomen is soft.  Musculoskeletal:     Comments: RUE and RLE weakness  Neurological:     Mental Status: She is alert and oriented to person, place, and time.  Psychiatric:        Mood and Affect: Mood normal. Mood is not anxious.        Behavior: Behavior normal. Behavior is not agitated.             Total Time 50 minutes   Time spent includes: Detailed review of medical records (labs, imaging, vital signs), medically appropriate exam (mental status, respiratory, cardiac, skin), discussed with treatment team, counseling and educating patient, family and staff, documenting clinical information, medication management and coordination of care.  Lamarr L. Arvid, DNP, FNP-BC Palliative Medicine Team

## 2023-11-02 NOTE — Evaluation (Signed)
 Occupational Therapy Re-Evaluation Patient Details Name: Michele House MRN: [id number] DOB: 1955-11-21 Today's Date: 11/02/2023   History of present illness Patient is a 67 year old with prolonged hospital stay, originally admitted with AMS. 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 She developed respiratory distress requiring mechanical ventilation. Self extubated 12/27 with re-intubation and extubation on 12/30.   OT comments  [name] was seen for OT re-evlauation on this date. Upon arrival to room pt in bed, agreeable to tx. Pt requires MAX A hand over hand for bed level grooming with non-dominant LUE. TOTAL A x2 for L+R rolling and sup<>sit with losses of balance in all directions. RUE no active movement is observed, edema noted, 2 finger subluxation noted to R shoulder - RN notified. Goals updated to reflect increased needs, will continue to follow POC. Discharge recommendation remains appropriate.       If plan is discharge home, recommend the following:  Assistance with cooking/housework;Assist for transportation;Help with stairs or ramp for entrance;Direct supervision/assist for medications management;Supervision due to cognitive status;Direct supervision/assist for financial management;Two people to help with walking and/or transfers;Two people to help with bathing/dressing/bathroom   Equipment Recommendations  Other (comment) (defer)    Recommendations for Other Services      Precautions / Restrictions Precautions Precautions: Fall Precaution Comments: right side hemiparesis Restrictions Weight Bearing Restrictions Per Provider Order: No       Mobility Bed Mobility Overal bed mobility: Needs Assistance Bed Mobility: Rolling, Supine to Sit, Sit to Supine Rolling: Total assist, +2 for physical assistance   Supine to sit: Total assist, +2 for physical assistance Sit to supine: Total assist, +2 for physical assistance         Transfers                   General transfer comment: unsafe to attempt due to poor sitting balance and significant global weakness     Balance Overall balance assessment: Needs assistance Sitting-balance support: Feet supported Sitting balance-Leahy Scale: Zero Sitting balance - Comments: loss of balance in all directions with no protective righting reactions. total assistance required                                   ADL either performed or assessed with clinical judgement   ADL Overall ADL's : Needs assistance/impaired                                       General ADL Comments: MAX A hand over hand for bed level grooming with non-dominant LUE. TOTAL A x2 for bed level toileting.     Extremity/Trunk Assessment Upper Extremity Assessment Upper Extremity Assessment: RUE deficits/detail RUE Deficits / Details: no movement is observed, edema noted, 2 finger subluxation noted to R shoulder - RN notified RUE: Subluxation noted   Lower Extremity Assessment Lower Extremity Assessment: Defer to PT evaluation RLE Deficits / Details: patient verbalized pain with nail bed pressure. flaccid throughout LLE Deficits / Details: minimal active movement noted, generalized weakness throughout         Cognition Arousal: Alert Behavior During Therapy: Flat affect Overall Cognitive Status: Impaired/Different from baseline Area of Impairment: Following commands, Problem solving  Following Commands: Follows one step commands with increased time     Problem Solving: Slow processing, Decreased initiation, Difficulty sequencing, Requires verbal cues, Requires tactile cues General Comments: pleasant and willing to mobilize              General Comments Sp02 in the 90's on 2 L02    Pertinent Vitals/ Pain       Pain Assessment Pain Assessment: Faces Faces Pain Scale: Hurts little more Pain Location: generalized pain  with movement Pain Descriptors / Indicators: Discomfort Pain Intervention(s): Limited activity within patient's tolerance  Home Living Family/patient expects to be discharged to:: Private residence Living Arrangements: Children Available Help at Discharge: Family;Available PRN/intermittently Type of Home: Mobile home Home Access: Stairs to enter Entrance Stairs-Number of Steps: 4-5 Entrance Stairs-Rails: Right;Left;Can reach both Home Layout: One level     Bathroom Shower/Tub: Chief Strategy Officer: Standard     Home Equipment: Grab bars - tub/shower              Frequency  Min 1X/week        Progress Toward Goals  OT Goals(current goals can now be found in the care plan section)  Progress towards OT goals: Progressing toward goals;Goals drowngraded-see care plan  Acute Rehab OT Goals Patient Stated Goal: to get out of bed OT Goal Formulation: With patient Time For Goal Achievement: 11/16/23 Potential to Achieve Goals: Fair ADL Goals Pt Will Perform Lower Body Dressing: with mod assist;sitting/lateral leans Pt Will Transfer to Toilet: with min assist (rolling bed level) Pt Will Perform Toileting - Clothing Manipulation and hygiene: with mod assist;sitting/lateral leans Additional ADL Goal #1: Pt will demonstrate good safety awareness during ADL/mobility tasks requiring no VC for safety, 3/3 opportunities.  Plan      Co-evaluation      Reason for Co-Treatment: Complexity of the patient's impairments (multi-system involvement);Necessary to address cognition/behavior during functional activity;For patient/therapist safety;To address functional/ADL transfers PT goals addressed during session: Mobility/safety with mobility OT goals addressed during session: ADL's and self-care      AM-PAC OT 6 Clicks Daily Activity     Outcome Measure   Help from another person eating meals?: A Lot Help from another person taking care of personal grooming?: A  Lot Help from another person toileting, which includes using toliet, bedpan, or urinal?: Total Help from another person bathing (including washing, rinsing, drying)?: Total Help from another person to put on and taking off regular upper body clothing?: Total Help from another person to put on and taking off regular lower body clothing?: Total 6 Click Score: 8    End of Session    OT Visit Diagnosis: Other abnormalities of gait and mobility (R26.89);Muscle weakness (generalized) (M62.81);Hemiplegia and hemiparesis Hemiplegia - Right/Left: Right Hemiplegia - dominant/non-dominant: Dominant Hemiplegia - caused by: Cerebral infarction   Activity Tolerance Patient tolerated treatment well   Patient Left in bed;with call bell/phone within reach   Nurse Communication Mobility status        Time: 1434-1450 OT Time Calculation (min): 16 min  Charges: OT General Charges $OT Visit: 1 Visit OT Evaluation $OT Re-eval: 1 Re-eval  Elston Slot, M.S. OTR/L  11/02/23, 4:21 PM  ascom [phone]

## 2023-11-02 NOTE — Evaluation (Signed)
 Physical Therapy Re-Evaluation Patient Details Name: Michele House MRN: [id number] DOB: 1956/04/28 Today's Date: 11/02/2023  History of Present Illness  Patient is a 67 year old with prolonged hospital stay, originally admitted with AMS. 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 She developed respiratory distress requiring mechanical ventilation. Self extubated 12/27 with re-intubation and extubation on 12/30.  Clinical Impression  Patient was awake and agreeable to attempt mobilizing. She continues to have significant weakness, right more than left. She required + 2 person assistance for bed mobility. Total assistance is required to maintain sitting balance with no righting reactions noted and loss of balance in all directions. She has significant weakness throughout and is fatigued with minimal activity. Recommend PT continue to follow to maximize independence and decrease caregiver burden. Rehabilitation < 3 hours/day recommended later this hospital stay.       If plan is discharge home, recommend the following: Two people to help with walking and/or transfers;Two people to help with bathing/dressing/bathroom;Assistance with cooking/housework;Direct supervision/assist for medications management;Assistance with feeding;Direct supervision/assist for financial management;Assist for transportation;Help with stairs or ramp for entrance;Supervision due to cognitive status   Can travel by private vehicle   No    Equipment Recommendations None recommended by PT (to be determined at next level of care)  Recommendations for Other Services       Functional Status Assessment Patient has had a recent decline in their functional status and demonstrates the ability to make significant improvements in function in a reasonable and predictable amount of time.     Precautions / Restrictions Precautions Precautions: Fall Precaution Comments: right side  hemiparesis Restrictions Weight Bearing Restrictions Per Provider Order: No      Mobility  Bed Mobility Overal bed mobility: Needs Assistance Bed Mobility: Rolling, Supine to Sit, Sit to Supine Rolling: Total assist, +2 for physical assistance   Supine to sit: Total assist, +2 for physical assistance Sit to supine: Total assist, +2 for physical assistance        Transfers                   General transfer comment: unsafe to attempt due to poor sitting balance and significant global weakness    Ambulation/Gait                  Stairs            Wheelchair Mobility     Tilt Bed    Modified Rankin (Stroke Patients Only)       Balance Overall balance assessment: Needs assistance Sitting-balance support: Feet supported Sitting balance-Leahy Scale: Zero Sitting balance - Comments: loss of balance in all directions with no protective righting reactions. total assistance required                                     Pertinent Vitals/Pain Pain Assessment Pain Assessment: Faces Faces Pain Scale: Hurts little more Pain Location: generalized pain with movement Pain Descriptors / Indicators: Discomfort Pain Intervention(s): Repositioned, Limited activity within patient's tolerance, Monitored during session    Home Living Family/patient expects to be discharged to:: Private residence Living Arrangements: Children Available Help at Discharge: Family;Available PRN/intermittently Type of Home: Mobile home Home Access: Stairs to enter Entrance Stairs-Rails: Right;Left;Can reach both Entrance Stairs-Number of Steps: 4-5   Home Layout: One level Home Equipment: Grab bars - tub/shower  Prior Function Prior Level of Function : Needs assist             Mobility Comments: Pt reports furniture walking prior to admission; unsure about falls ADLs Comments: Sits in the tub; reports using 4L tries to, doesn't get dressed just  wears a pajama gown during the day, indep with toileting, sister drives to appts, sister brings groceries, very light meal prep, reports being all confused' with her medications     Extremity/Trunk Assessment   Upper Extremity Assessment Upper Extremity Assessment: Defer to OT evaluation RUE Deficits / Details: no movement is observed with functional activity. see OT note    Lower Extremity Assessment Lower Extremity Assessment: Generalized weakness RLE Deficits / Details: patient verbalized pain with nail bed pressure. flaccid throughout LLE Deficits / Details: minimal active movement noted, generalized weakness throughout       Communication   Communication Communication: Difficulty following commands/understanding Following commands: Follows one step commands with increased time Cueing Techniques: Verbal cues;Gestural cues;Tactile cues;Visual cues  Cognition Arousal: Alert Behavior During Therapy: Flat affect Overall Cognitive Status: Impaired/Different from baseline Area of Impairment: Following commands, Problem solving                       Following Commands: Follows one step commands with increased time     Problem Solving: Slow processing, Decreased initiation, Difficulty sequencing, Requires verbal cues, Requires tactile cues          General Comments General comments (skin integrity, edema, etc.): Sp02 in the 90's on 2 L02    Exercises     Assessment/Plan    PT Assessment Patient needs continued PT services  PT Problem List Decreased strength;Decreased activity tolerance;Decreased balance;Decreased mobility;Cardiopulmonary status limiting activity;Decreased knowledge of use of DME;Decreased safety awareness;Decreased cognition;Decreased coordination;Decreased knowledge of precautions       PT Treatment Interventions DME instruction;Therapeutic exercise;Gait training;Balance training;Neuromuscular re-education;Functional mobility  training;Therapeutic activities;Patient/family education;Cognitive remediation    PT Goals (Current goals can be found in the Care Plan section)  Acute Rehab PT Goals Patient Stated Goal: to get out of bed PT Goal Formulation: With patient Time For Goal Achievement: 11/16/23 Potential to Achieve Goals: Fair    Frequency Min 1X/week     Co-evaluation PT/OT/SLP Co-Evaluation/Treatment: Yes Reason for Co-Treatment: Complexity of the patient's impairments (multi-system involvement);Necessary to address cognition/behavior during functional activity;For patient/therapist safety;To address functional/ADL transfers PT goals addressed during session: Mobility/safety with mobility OT goals addressed during session: ADL's and self-care       AM-PAC PT 6 Clicks Mobility  Outcome Measure Help needed turning from your back to your side while in a flat bed without using bedrails?: Total Help needed moving from lying on your back to sitting on the side of a flat bed without using bedrails?: Total Help needed moving to and from a bed to a chair (including a wheelchair)?: Total Help needed standing up from a chair using your arms (e.g., wheelchair or bedside chair)?: Total Help needed to walk in hospital room?: Total Help needed climbing 3-5 steps with a railing? : Total 6 Click Score: 6    End of Session Equipment Utilized During Treatment: Oxygen Activity Tolerance: Patient limited by fatigue Patient left: in bed;with call bell/phone within reach;with SCD's reapplied Nurse Communication: Mobility status PT Visit Diagnosis: Unsteadiness on feet (R26.81);Other abnormalities of gait and mobility (R26.89);Muscle weakness (generalized) (M62.81);Difficulty in walking, not elsewhere classified (R26.2);Hemiplegia and hemiparesis Hemiplegia - Right/Left: Right Hemiplegia - dominant/non-dominant: Dominant Hemiplegia - caused  by: Cerebral infarction    Time: 1434-1450 PT Time Calculation (min)  (ACUTE ONLY): 16 min   Charges:   PT Evaluation $PT Re-evaluation: 1 Re-eval   PT General Charges $$ ACUTE PT VISIT: 1 Visit         Randine Essex, PT, MPT   Randine LULLA Essex 11/02/2023, 3:33 PM

## 2023-11-02 NOTE — Progress Notes (Signed)
 NAME:  Michele House, MRN:  [id number], DOB:  12/11/1955, LOS: 31 ADMISSION DATE:  10/02/2023   History of Present Illness:  Case of a 67 year old female patient with a past medical history of COPD with chronic respiratory failure on 2 L nasal cannula, hypertension, type 2 diabetes, heart failure with preserved EF who has been here for a month.  Initially presented with altered mental status encephalopathy.  Had an extensive workup including brain MRI which was negative for any acute intracranial abnormality initially.  However unfortunately course complicated by acute CVA with repeat MRI showing multiple acute infarcts in the high bilateral posterior frontal and parietal lobes.  She recovered from that but course further complicated by waxing and waning mental status which prompted an LP to rule out encephalitis and has been unremarkable so far.  She was found to have right upper lobe lung nodule which was concerning for malignancy and underwent robotic assisted navigational bronchoscopy with biopsy on 12/27 which showed very rare atypical cells and reactive alveolar cells mostly.    Pertinent  Medical History  As abnove  Significant Hospital Events: Including procedures, antibiotic start and stop dates in addition to other pertinent events   12/01: Vital stable, respiratory viral panel negative, procalcitonin negative, preliminary blood cultures negative.  Urine cultures are ordered as add-on, ammonia levels mildly elevated at 39, strep pneumo negative, CBG elevated at 244, history of enlarging pulmonary nodule with recommendations for PET/CT during most recent admission which has not been done yet, if video bronchoscopy was scheduled for 10/13/2023 as outpatient. 12/02: Unable to take care of herself and son cannot provide the required care as she required 24-hour supervision due to worsening dementia.  PT is recommending SNF. 12/03: Blood pressure started trending up, restarting home  Zestoretic .  Urine cultures with strep agalactiae, penicillin allergy noted, will continue with ceftriaxone  for now.  Also started on Remeron  for concern of worsening depression and unable to sleep at night  12/05:  Patient has some blurry vision and weakness and pain.  MRI of the brain negative for acute intracranial abnormality. 12/08:  Patient more confused today than yesterday.  Able to follow some simple commands but not as good of a conversation today as yesterday. 12/09:  Patient still confused today.  Does not follow simple commands today. 12/10: Patient more talkative today but was looking up at the ceiling when she was talking with me.  She does not move her extremities to command but does move them on her own.  Physical therapy and Occupational Therapy did not see her move her right arm. 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  Mild associated petechial hemorrhage.  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 12/12: Patient with new stroke, CTA head and neck with no large vessel occlusion.  Patient had a recent echocardiogram which was normal. Concern of paraneoplastic versus hypercoagulability secondary to malignancy.  Patient missed her appointment for bronchoscopy and outpatient appointment for PET scan due to recurrent hospitalizations. 12/13: Patient continued to have waxing and waning mental status, unable to move right upper and lower extremities.  Having some hallucination.  Pulmonary is planning lung biopsy on 12/27 if she remains stable.  Rapidly progressive dementia and now with underlying stroke, question of paraneoplastic encephalitis and hypercoagulability secondary to underlying undiagnosed malignancy. 12/15: Patient with much  improved mentation.  Having some flickering of movements on right  upper and lower extremity today, left extremities weaker but seems improving.   12/18: Pt underwent bronchoscopy and lung biopsy 12/19: patient stable , she is being optimized for dc for rehab.  She's cleared from pulmonary for discharge. Results from bronch will take 1 week.   12/26: Pt transferred to the stepdown unit with acute hypercapnic respiratory requiring Bipap but subsequently required mechanical intubation due to worsening respiratory failure.  Insulin  gtt initiated due to severe hyperglycemia.  With hemorrhagic shock due to bleeding duodenal ulcer, underwent emergent EGD with successful hemostasis by injection of Epinephrine  and hemostatic clip. 12/27: Hgb slowly trending down, no report of bleeding from OG suction nor melena. Weaning down vasopressors, if able to continue to wean vasopressors, then can consider WUA/SBT.  Giving temporizing measures for Hyperkalemia. Self extubated but required reintubation. Hgb continued to trend down, given 1 unit pRBC's and 2 FFP.  IR consulted for consideration for embolization. 12/28: Weaned off Vasopressin , Levophed  weaning down.  S/p IR embolization of the Duodenal artery. Peak pressures and lung sounds improved with Ketamine , will keep intubated today given rapid reintubation yesterday. 12/30- patient on PRVC FiO2 28%, weaning off ketamine  and hoping to perform SBT today.  Holding feeds until post SBT.  11/02/23- patient is stable overnight no acute events, electrllytes improved.  No GI bleeds noted.  Opitimizing for TRH today.   Interim History / Subjective:  Remains Intubated with significant wheezing and chest tightness.   Objective   Blood pressure (!) 105/51, pulse 88, temperature 97.8 F (36.6 C), temperature source Oral, resp. rate 14, height 5' (1.524 m), weight 68.1 kg, last menstrual period 12/24/1998, SpO2 98%.    Vent Mode: PSV FiO2 (%):  [28 %] 28 % Set Rate:  [14 bmp] 14 bmp Vt Set:  [450 mL] 450 mL PEEP:  [5 cmH20] 5  cmH20 Pressure Support:  [5 cmH20] 5 cmH20   Intake/Output Summary (Last 24 hours) at 11/02/2023 1034 Last data filed at 11/02/2023 1022 Gross per 24 hour  Intake 547.61 ml  Output 3060 ml  Net -2512.39 ml   Filed Weights   10/20/23 0732 10/28/23 1431 11/02/23 0438  Weight: 74.1 kg 66.5 kg 68.1 kg    Examination: General: Awake, follows simple commands  HENT: Supple neck reactive pupils  Lungs: Diffuse wheezing and diminished air movement.  Cardiovascular: Normal S1, Normal S2, RRR  Abdomen: Soft, non tender, non distended, +BS  Extremities: Warm and well perfused no edema.   Labs and imaging were reviewed.   Assessment & Plan:  Case of a 67 year old female patient presenting 4 weeks ago with confusion and altered mental status with course c/b AHRF requring intubation 12/26 likely secondary to metapneumovirus and COPD exacerbation. Course complicated by hemorrhagic shock from a duodenal ulcer s/p EGD 12/26 with clip placed s/p IR embolization 10/30/2023. Remains intubated with significant wheezing and chest tightness.   #Acute hypoxic respiratory failure - RESOLVED  #COPD/asthma exacerbation in the setting of metapneumovirus - IMPROVED  #Acute anemia with hemorrhagic shock secondary to Duodenal ulcer s.p EGD clip and Injection 10/28/2023, s/p IR Embolization on 10/30/2023- S/P SLP WITH NOW HAVING NORMAL BM #Metabolic encephalopathy- RESOLVED  #Acute CVA during this admission with right sided hemiparesis- ONGOING PT #AKI - RESOLVED     Best Practice (right click and Reselect all SmartList Selections daily)   Diet/type: NPO DVT prophylaxis SCD Pressure ulcer(s): N/A GI prophylaxis: PPI Lines: Central line Foley:  Yes, and it is still needed Code Status:  full code   Critical care provider statement:   Total critical care time: 33 minutes   Performed by: Parris MD   Critical care time was exclusive of separately billable procedures and treating other patients.    Critical care was necessary to treat or prevent imminent or life-threatening deterioration.   Critical care was time spent personally by me on the following activities: development of treatment plan with patient and/or surrogate as well as nursing, discussions with consultants, evaluation of patient's response to treatment, examination of patient, obtaining history from patient or surrogate, ordering and performing treatments and interventions, ordering and review of laboratory studies, ordering and review of radiographic studies, pulse oximetry and re-evaluation of patient's condition.    Cairo Lingenfelter, M.D.  Pulmonary & Critical Care Medicine

## 2023-11-02 NOTE — Plan of Care (Signed)
  Problem: Coping: Goal: Ability to adjust to condition or change in health will improve Outcome: Progressing   Problem: Fluid Volume: Goal: Ability to maintain a balanced intake and output will improve Outcome: Progressing   Problem: Metabolic: Goal: Ability to maintain appropriate glucose levels will improve Outcome: Not Progressing   Problem: Skin Integrity: Goal: Risk for impaired skin integrity will decrease Outcome: Progressing   Problem: Clinical Measurements: Goal: Ability to maintain clinical measurements within normal limits will improve Outcome: Progressing Goal: Will remain free from infection Outcome: Progressing

## 2023-11-02 NOTE — Progress Notes (Signed)
 Foley removed; and PureWick placed. Pt verbalized understanding.

## 2023-11-02 NOTE — Consult Note (Signed)
 PHARMACY CONSULT NOTE - ELECTROLYTES  Pharmacy Consult for Electrolyte Monitoring and Replacement   Recent Labs: Height: 5' (152.4 cm) Weight: 68.1 kg (150 lb 2.1 oz) IBW/kg (Calculated) : 45.5 Estimated Creatinine Clearance: 57.1 mL/min (by C-G formula based on SCr of 0.68 mg/dL). Potassium (mmol/L)  Date Value  11/02/2023 3.4 (L)  09/30/2013 3.7   Magnesium  (mg/dL)  Date Value  87/68/7975 2.1   Calcium  (mg/dL)  Date Value  87/68/7975 8.2 (L)   Calcium , Total (mg/dL)  Date Value  88/70/7985 9.2   Albumin (g/dL)  Date Value  87/73/7975 2.7 (L)  09/30/2013 3.7   Phosphorus (mg/dL)  Date Value  87/68/7975 1.8 (L)   Sodium (mmol/L)  Date Value  11/02/2023 145  09/30/2013 136   Assessment  Michele House is a 67 y.o. female presenting with shortness of breath. PMH significant for COPD, chronic respiratory failure on 2 L, hypertension, type 2 diabetes, HFpEF. Recent discharge from the hospital 24th November 2024 after hospitalization for COPD exacerbation complicated by respiratory failure. She presented to the hospital again on 10/02/2023 with chest pain, malaise, cough, shortness of breath, abdominal pain and dysuria. Pharmacy has been consulted to monitor and replace electrolytes.  Goal of Therapy: Electrolytes WNL  Plan:  K 3.4, Phos 1.8 ordered Kcl 10 mEq q1h IV x 2 doses and potassium phosphate  30 mmol IV x 1 Will check all electrolytes with AM labs  Thank you for allowing pharmacy to be a part of this patient's care.  Marolyn KATHEE Mare 11/02/2023 7:38 AM

## 2023-11-02 NOTE — Plan of Care (Signed)
 Alert and oriented today, although forgetful. Pt is also very weak; unable to feed self, as right side appears paralyzed and left side is extremely weak. Eating poorly, but swallows well. States has an appetite but unable to consume much at this time as she gets full. O2 sats 96% on 2LPM [employer] and she uses this chronically at home. Has nonproductive cough. Palliative service is involved in patient's care and was in to see patient today. Son also came to bedside today, assisted in feeding pt. Foley remains in place, CDI, approx 300 cc out so far this shift. PT in to see pt this afternoon; pt states was motivated to participate.   Problem: Coping: Goal: Ability to adjust to condition or change in health will improve Outcome: Progressing   Problem: Fluid Volume: Goal: Ability to maintain a balanced intake and output will improve Outcome: Progressing   Problem: Metabolic: Goal: Ability to maintain appropriate glucose levels will improve Outcome: Progressing   Problem: Nutritional: Goal: Maintenance of adequate nutrition will improve Outcome: Progressing   Problem: Skin Integrity: Goal: Risk for impaired skin integrity will decrease Outcome: Progressing   Problem: Tissue Perfusion: Goal: Adequacy of tissue perfusion will improve Outcome: Progressing   Problem: Clinical Measurements: Goal: Ability to maintain clinical measurements within normal limits will improve Outcome: Progressing Goal: Will remain free from infection Outcome: Progressing Goal: Diagnostic test results will improve Outcome: Progressing Goal: Respiratory complications will improve Outcome: Progressing Goal: Cardiovascular complication will be avoided Outcome: Progressing   Problem: Activity: Goal: Risk for activity intolerance will decrease Outcome: Progressing   Problem: Nutrition: Goal: Adequate nutrition will be maintained Outcome: Progressing   Problem: Coping: Goal: Level of anxiety will  decrease Outcome: Progressing   Problem: Elimination: Goal: Will not experience complications related to bowel motility Outcome: Progressing   Problem: Pain Management: Goal: General experience of comfort will improve Outcome: Progressing   Problem: Safety: Goal: Ability to remain free from injury will improve Outcome: Progressing   Problem: Skin Integrity: Goal: Risk for impaired skin integrity will decrease Outcome: Progressing   Problem: Activity: Goal: Ability to tolerate increased activity will improve Outcome: Progressing Goal: Will verbalize the importance of balancing activity with adequate rest periods Outcome: Progressing   Problem: Respiratory: Goal: Ability to maintain a clear airway will improve Outcome: Progressing Goal: Levels of oxygenation will improve Outcome: Progressing Goal: Ability to maintain adequate ventilation will improve Outcome: Progressing   Problem: Activity: Goal: Ability to tolerate increased activity will improve Outcome: Progressing   Problem: Clinical Measurements: Goal: Ability to maintain a body temperature in the normal range will improve Outcome: Progressing   Problem: Respiratory: Goal: Ability to maintain adequate ventilation will improve Outcome: Progressing Goal: Ability to maintain a clear airway will improve Outcome: Progressing

## 2023-11-02 NOTE — Progress Notes (Signed)
 Nutrition Follow-up  DOCUMENTATION CODES:   Not applicable  INTERVENTION:   -MVI with minerals daily -Magic cup TID with meals, each supplement provides 290 kcal and 9 grams of protein  -Ensure Enlive po BID, each supplement provides 350 kcal and 20 grams of protein.   NUTRITION DIAGNOSIS:   Inadequate oral intake related to inability to eat (pt sedated and ventilated) as evidenced by NPO status.  Progressing; advanced to PO diet on 11/01/23  GOAL:   Patient will meet greater than or equal to 90% of their needs  Progressing   MONITOR:   PO intake, Supplement acceptance, Diet advancement  REASON FOR ASSESSMENT:   Ventilator    ASSESSMENT:   67 y/o female with h/o HTN, HLD, COPD, CHF, thyroid  disease, lung nodule and GERD who is admitted with COPD/asthma exacerbation in the setting of metapneumovirus infection, acute anemia with hemorrhagic shock secondary to duodenal ulcer s/p EGD with clip and Injection 10/28/2023 and s/p IR Embolization on 10/30/2023, AKI and acute CVA with right sided hemiparesis.  12/30- extubated, advanced to a dysphagia 3 diet  Reviewed I/O's: -2.6 L x 24 hours and -6.6 L since 10/19/23  UOP: 3 L x 24 hours   Pt lying in bed at time of visit, resting quietly and receiving nursing care. Pt advanced to a dysphagia 3 diet yesterday. Given episodes of hypoglycemia, will add supplements to support adequate oral intake.   Medications reviewed and include colace, protonix , miralax , senokot, and zoloft .   Palliative care following for goals of care discussions; allow time for outcomes.   Labs reviewed: K: 3.4, CBGS: 53-220 (inpatient orders for glycemic control are 0-9 units insulin  aspart every 4 hours).    Diet Order:   Diet Order             DIET DYS 3 Room service appropriate? Yes; Fluid consistency: Thin  Diet effective now                   EDUCATION NEEDS:   No education needs have been identified at this time  Skin:  Skin  Assessment: Reviewed RN Assessment  Last BM:  11/01/23  Height:   Ht Readings from Last 1 Encounters:  10/31/23 5' (1.524 m)    Weight:   Wt Readings from Last 1 Encounters:  11/02/23 68.1 kg    Ideal Body Weight:  45.45 kg  BMI:  Body mass index is 29.32 kg/m.  Estimated Nutritional Needs:   Kcal:  1600-1800  Protein:  85-100 grams  Fluid:  > 1.6 L    Margery ORN, RD, LDN, CDCES Registered Dietitian III Certified Diabetes Care and Education Specialist If unable to reach this RD, please use RD Inpatient group chat on secure chat between hours of 8am-4 pm daily

## 2023-11-03 DIAGNOSIS — J441 Chronic obstructive pulmonary disease with (acute) exacerbation: Secondary | ICD-10-CM | POA: Diagnosis not present

## 2023-11-03 DIAGNOSIS — I5032 Chronic diastolic (congestive) heart failure: Secondary | ICD-10-CM | POA: Diagnosis not present

## 2023-11-03 DIAGNOSIS — I639 Cerebral infarction, unspecified: Secondary | ICD-10-CM | POA: Diagnosis not present

## 2023-11-03 DIAGNOSIS — G9341 Metabolic encephalopathy: Secondary | ICD-10-CM | POA: Diagnosis not present

## 2023-11-03 LAB — CBC
HCT: 26.4 % — ABNORMAL LOW (ref 36.0–46.0)
Hemoglobin: 8.3 g/dL — ABNORMAL LOW (ref 12.0–15.0)
MCH: 30.3 pg (ref 26.0–34.0)
MCHC: 31.4 g/dL (ref 30.0–36.0)
MCV: 96.4 fL (ref 80.0–100.0)
Platelets: 219 10*3/uL (ref 150–400)
RBC: 2.74 MIL/uL — ABNORMAL LOW (ref 3.87–5.11)
RDW: 19.2 % — ABNORMAL HIGH (ref 11.5–15.5)
WBC: 4.5 10*3/uL (ref 4.0–10.5)
nRBC: 0.4 % — ABNORMAL HIGH (ref 0.0–0.2)

## 2023-11-03 LAB — GLUCOSE, CAPILLARY
Glucose-Capillary: 80 mg/dL (ref 70–99)
Glucose-Capillary: 79 mg/dL (ref 70–99)
Glucose-Capillary: 227 mg/dL — ABNORMAL HIGH (ref 70–99)
Glucose-Capillary: 236 mg/dL — ABNORMAL HIGH (ref 70–99)
Glucose-Capillary: 304 mg/dL — ABNORMAL HIGH (ref 70–99)
Glucose-Capillary: 215 mg/dL — ABNORMAL HIGH (ref 70–99)
Glucose-Capillary: 82 mg/dL (ref 70–99)

## 2023-11-03 LAB — BASIC METABOLIC PANEL
Anion gap: 7 (ref 5–15)
BUN: 14 mg/dL (ref 8–23)
CO2: 34 mmol/L — ABNORMAL HIGH (ref 22–32)
Calcium: 8.1 mg/dL — ABNORMAL LOW (ref 8.9–10.3)
Chloride: 99 mmol/L (ref 98–111)
Creatinine, Ser: 0.53 mg/dL (ref 0.44–1.00)
GFR, Estimated: >60 mL/min (ref >60)
Glucose, Bld: 90 mg/dL (ref 70–99)
Potassium: 4.1 mmol/L (ref 3.5–5.1)
Sodium: 140 mmol/L (ref 135–145)

## 2023-11-03 LAB — MAGNESIUM: Magnesium: 2 mg/dL (ref 1.7–2.4)

## 2023-11-03 LAB — PHOSPHORUS: Phosphorus: 2.2 mg/dL — ABNORMAL LOW (ref 2.5–4.6)

## 2023-11-03 MED ORDER — K PHOS MONO-SOD PHOS DI & MONO 155-852-130 MG PO TABS
500.0000 mg | ORAL_TABLET | ORAL | Status: AC
  Administered 2023-11-03 (×2): 500 mg via ORAL
  Filled 2023-11-03 (×2): qty 2

## 2023-11-03 MED ORDER — IPRATROPIUM-ALBUTEROL 0.5-2.5 (3) MG/3ML IN SOLN
3.0000 mL | Freq: Three times a day (TID) | RESPIRATORY_TRACT | Status: DC
  Administered 2023-11-03 – 2023-11-09 (×17): 3 mL via RESPIRATORY_TRACT
  Filled 2023-11-03 (×17): qty 3

## 2023-11-03 NOTE — Plan of Care (Signed)
 VSS, afebrile. Alert and oriented. Asks appropriate questions. Making attempts to eat, swallows well, takes pills whole. However, complains that food is horrible, therefore intake is poor; detailed records of po intake on flowsheet. Requests to be allowed to get different choices; discussed with hospitalist, no change at this time. Foley removed yesterday and external catheter being utilized without difficulty. Perineal areas are reddened but intact, antifungal powder applied; skin otherwise intact. Right side is flaccid, and right upper extremity extremely weak; requires total assist for feeding. Blood sugars are varied, received 10 units Semglee , and also receive sliding scale correction coverage. O2 remains on at 2LPM, per chronic home use. NSR on telemetry. Awaiting PCU bed availability. Aware that she will likely go to rehab prior to going home. States is motivated to get better.   Problem: Coping: Goal: Ability to adjust to condition or change in health will improve Outcome: Progressing   Problem: Fluid Volume: Goal: Ability to maintain a balanced intake and output will improve Outcome: Progressing   Problem: Metabolic: Goal: Ability to maintain appropriate glucose levels will improve Outcome: Progressing   Problem: Nutritional: Goal: Maintenance of adequate nutrition will improve Outcome: Progressing Goal: Progress toward achieving an optimal weight will improve Outcome: Progressing   Problem: Skin Integrity: Goal: Risk for impaired skin integrity will decrease Outcome: Progressing   Problem: Tissue Perfusion: Goal: Adequacy of tissue perfusion will improve Outcome: Progressing   Problem: Clinical Measurements: Goal: Ability to maintain clinical measurements within normal limits will improve Outcome: Progressing Goal: Will remain free from infection Outcome: Progressing Goal: Diagnostic test results will improve Outcome: Progressing Goal: Respiratory complications will  improve Outcome: Progressing Goal: Cardiovascular complication will be avoided Outcome: Progressing   Problem: Activity: Goal: Risk for activity intolerance will decrease Outcome: Progressing   Problem: Nutrition: Goal: Adequate nutrition will be maintained Outcome: Progressing   Problem: Coping: Goal: Level of anxiety will decrease Outcome: Progressing   Problem: Elimination: Goal: Will not experience complications related to bowel motility Outcome: Progressing Goal: Will not experience complications related to urinary retention Outcome: Progressing   Problem: Pain Management: Goal: General experience of comfort will improve Outcome: Progressing   Problem: Safety: Goal: Ability to remain free from injury will improve Outcome: Progressing   Problem: Skin Integrity: Goal: Risk for impaired skin integrity will decrease Outcome: Progressing   Problem: Education: Goal: Knowledge of disease or condition will improve Outcome: Progressing Goal: Knowledge of the prescribed therapeutic regimen will improve Outcome: Progressing   Problem: Activity: Goal: Ability to tolerate increased activity will improve Outcome: Progressing Goal: Will verbalize the importance of balancing activity with adequate rest periods Outcome: Progressing   Problem: Respiratory: Goal: Ability to maintain a clear airway will improve Outcome: Progressing Goal: Levels of oxygenation will improve Outcome: Progressing Goal: Ability to maintain adequate ventilation will improve Outcome: Progressing   Problem: Activity: Goal: Ability to tolerate increased activity will improve Outcome: Progressing   Problem: Clinical Measurements: Goal: Ability to maintain a body temperature in the normal range will improve Outcome: Progressing   Problem: Respiratory: Goal: Ability to maintain adequate ventilation will improve Outcome: Progressing Goal: Ability to maintain a clear airway will improve Outcome:  Progressing   Problem: Education: Goal: Knowledge of disease or condition will improve Outcome: Progressing Goal: Knowledge of secondary prevention will improve (MUST DOCUMENT ALL) Outcome: Progressing

## 2023-11-03 NOTE — Progress Notes (Addendum)
 Progress Note    Michele House  FMW:[id number] DOB: [birth year]  DOA: 10/02/2023 PCP: [facility]      Brief Narrative:    Medical records reviewed and are as summarized below:  Michele House is a 68 y.o. female with medical history significant of COPD, chronic respiratory failure on 2 L, hypertension, type 2 diabetes, HFpEF, recent discharge from the hospital 24th November 2024 after hospitalization for COPD exacerbation complicated by respiratory failure.  She presented to the hospital again on 10/02/2023 with chest pain, malaise, cough, shortness of breath, abdominal pain and dysuria.  She was admitted to the hospital for COPD exacerbation, acute on chronic hypoxemic respiratory failure, UTI and pneumonia.  She became more confused in the hospital and MRI brain revealed acute stroke.        12/1: Vital stable, respiratory viral panel negative, procalcitonin negative, preliminary blood cultures negative.  Urine cultures are ordered as add-on, ammonia levels mildly elevated at 39, strep pneumo negative, CBG elevated at 244, history of enlarging pulmonary nodule with recommendations for PET/CT during most recent admission which has not been done yet, if video bronchoscopy was scheduled for 10/13/2023 as outpatient.  Unable to take care of himself and son cannot provide the required care as she required 24-hour supervision due to worsening dementia.  PT is recommending SNF.  12/2: Hemodynamically stable, now on baseline oxygen of 2 L.  Needs SNF placement. Urine cultures pending.  12/3: Blood pressure started trending up, restarting home Zestoretic .  Urine cultures with strep agalactiae, penicillin allergy noted, will continue with ceftriaxone  for now.  Also started on Remeron  for concern of worsening depression and unable to sleep at night  12/4.  Some very concerned about patient's mental status not being seen.  Patient receiving antibiotics already  and should have improved by now.  Will hold Lipitor.  Restart low-dose Zoloft .  Discontinue Remeron  and do Seroquel  at night. 12/5.  Patient has some blurry vision and weakness and pain.  MRI of the brain negative for acute intracranial abnormality. 12/6.  Patient stated she slept well.  Patient able to lift up her arms up off the bed today.  Patient thought she heard her son's voice outside the room. 12/7.  Patient sitting up in chair when I saw her.  Was able to feed herself breakfast.  Felt okay.  Did not offer any complaints. 12/8.  Patient more confused today than yesterday.  Able to follow some simple commands but not as good of a conversation today as yesterday. 12/9.  Patient still confused today.  Does not follow simple commands today. 12/10.  Patient more talkative today but was looking up at the ceiling when she was talking with me.  She does not move her extremities to command but does move them on her own.  Physical therapy and Occupational Therapy did not see her move her right arm. 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  Mild associated petechial hemorrhage.  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 12/12: Patient with new stroke, CTA head and neck with no large vessel occlusion.  Patient had a recent echocardiogram which was normal. CSF cultures remain negative.  Lipid panel with increased triglyceride at 347, HDL 29 and LDL of 38.  CBC with mild thrombocytopenia at 130, BMP mostly unremarkable.  Patient with significant right-sided weakness. Concern of paraneoplastic versus hypercoagulability secondary to malignancy.  Patient missed her appointment for bronchoscopy and outpatient appointment for PET scan due to recurrent hospitalizations. Involving pulmonary and oncology for their input. Send  out encephalitis panel still pending.  12/13: Patient continued to have waxing and waning mental status, unable to move right upper and lower extremities.  Having some hallucination.  Pulmonary is planning lung biopsy on 12/27 if she remains stable.  Rapidly progressive dementia and now with underlying stroke, question of paraneoplastic encephalitis and hypercoagulability secondary to underlying undiagnosed malignancy. Patient will also get benefit from neuropsych evaluation as outpatient.  12/14: Patient with some improved mentation today.  Trying to squeeze with right hand but still no movements of right lower extremity.  12/15: Patient with much improved mentation.  Having some flickering of movements on right upper and lower extremity today, left extremities weaker but seems improving.   12/16: Mental status seems stable, flaccid right upper and lower extremities.  Complaining of muscle spasms so Robaxin  was ordered.  Pulmonary is trying to do bronchoscopy and biopsy on Wednesday.  12/18: Pt underwent bronchoscopy and lung biopsy 12/19: patient stable , she is being optimized for dc for rehab.  She's cleared from pulmonary for discharge. Results from bronch will take 1 week.   12/26: Pt transferred to the stepdown unit with acute hypercapnic respiratory requiring Bipap but subsequently required mechanical intubation due to worsening respiratory failure.  Insulin  gtt initiated due to severe hyperglycemia.  With hemorrhagic shock due to bleeding duodenal ulcer, underwent emergent EGD with successful hemostasis by injection of Epinephrine  and hemostatic clip. 12/27: Hgb slowly trending down, no report of bleeding from OG suction nor melena. Weaning down vasopressors, if able to continue to wean vasopressors, then can consider WUA/SBT.  Giving temporizing measures for Hyperkalemia. Self extubated but required reintubation. Hgb continued to trend down, given 1 unit pRBC's and 2 FFP.  IR consulted for  consideration for embolization. 12/28: Weaned off Vasopressin , Levophed  weaning down.  S/p IR embolization of the Duodenal artery. Peak pressures and lung sounds improved with Ketamine , will keep intubated today given rapid reintubation yesterday. 12/30- patient on PRVC FiO2 28%, weaning off ketamine  and hoping to perform SBT today.  Holding feeds until post SBT.  11/02/23- patient is stable overnight no acute events, electrllytes improved.  No GI bleeds noted.  Opitimizing for TRH today.     Assessment/Plan:   Principal Problem:   Acute metabolic encephalopathy Active Problems:   CVA (cerebral vascular accident) (HCC)   Uncontrolled type 2 diabetes mellitus with hypoglycemia, without long-term current use of insulin  (HCC)   Hypotension   Acute on chronic respiratory failure with hypoxia (HCC)   Hypernatremia   COPD exacerbation (HCC)   Lung nodule   Pneumonia   UTI (urinary tract infection)   (HFpEF) heart failure with preserved ejection fraction (HCC)   Essential hypertension   Type 2 diabetes mellitus with obesity (HCC)   Melena   Gastrointestinal hemorrhage with melena   Duodenal ulcer    Body mass index is 28.55 kg/m.   Acute metabolic encephalopathy: Improved. Initially completed 5 days of high-dose several IV Solu-Medrol  on 10/12/2023 Differential diagnosis include autoimmune encephalitis   Acute stroke with right hemiplegia: Off of Plavix  because of recent acute GI bleeding.  Right-sided weakness slowly improving. 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   Acute GI bleeding, bleeding duodenal ulcer: Continue Protonix . S/p EGD on 10/28/2023 with clip placed.  S/p IR embolization on 10/30/2023.     Hemorrhagic shock: Resolved Hypotension: Resolved.  Off of  vasopressors   Acute blood loss anemia: H&H is stable.  S/p transfusion with 2 units of PRBCs on 10/28/2023, 1 unit of PRBCs on 10/29/2023.  S/p transfusion with 2 units of platelet pheresis on 10/29/2023.   Pneumonia: Initially completed 5 days of IV ceftriaxone  and azithromycin  on 10/07/2023. She was restarted on broad-spectrum antibiotics (vancomycin , ceftriaxone  and Flagyl ) on 10/28/2023 when she decompensated requiring transfer to the ICU. Acute UTI: Urine culture showed strep agalactiae.  Completed IV ceftriaxone  on 10/07/2023.   Acute on chronic diastolic CHF: Stable.  S/p treatment with IV Lasix .  Anxiety/panic attacks: Ativan  as needed.  Consider restarting sertraline .   COPD exacerbation: This had previously resolved.  However, she had recurrent COPD exacerbation and was restarted on steroids. Continue prednisone  taper.   Acute on chronic hypoxemic respiratory failure, chronic hypercapnic respiratory failure: She was intubated on 10/28/2023 and extubated on 11/01/2023. She is tolerating 2 L/min oxygen via nasal cannula. Use BiPAP nightly.   Type II DM with severe hyperglycemia: Glucose levels stable.  Continue Semglee  and NovoLog . S/p treatment with IV insulin  drip.   1.3 cm right upper lung spiculated nodule: S/p bronchoscopy on 10/20/2023.   Pathology was negative for malignancy and findings more consistent with reactive bronchial cells with acute inflammation.   General weakness: PT and OT recommended discharge to SNF.   Patient was transferred to the hospitalist service on 11/03/2023. Transfer from stepdown to the progressive care unit.     Diet Order             DIET DYS 3 Room service appropriate? Yes; Fluid consistency: Thin  Diet effective now                            Consultants: Community Education Officer Interventional radiologist  Procedures: Lumbar puncture on 10/08/2023 Bronchoscopy on  10/20/2023 Intubation and mechanical ventilation on 10/28/2023 EGD on 10/28/2023 IR embolization of duodenal artery on 10/30/2023    Medications:    arformoterol   15 mcg Nebulization BID   Chlorhexidine  Gluconate Cloth  6 each Topical Daily   docusate sodium   100 mg Oral BID   feeding supplement  237 mL Oral BID BM   insulin  aspart  0-9 Units Subcutaneous Q4H   insulin  glargine-yfgn  10 Units Subcutaneous Daily   ipratropium-albuterol   3 mL Nebulization Q6H   levothyroxine   100 mcg Oral Q0600   multivitamin with minerals  1 tablet Oral Daily   mouth rinse  15 mL Mouth Rinse BID   pantoprazole   40 mg Oral BID   phosphorus  500 mg Oral Q4H   polyethylene glycol  17 g Oral Daily   predniSONE   50 mg Oral Q breakfast   Followed by   NOREEN ON 11/04/2023] predniSONE   45 mg Oral Q breakfast   Followed by   NOREEN ON 11/05/2023] predniSONE   40 mg Oral Q breakfast   Followed by   NOREEN ON 11/06/2023] predniSONE   35 mg Oral Q breakfast   Followed by   NOREEN ON 11/07/2023] predniSONE   30 mg Oral Q breakfast   Followed by   NOREEN ON 11/08/2023] predniSONE   25 mg Oral Q breakfast   Followed by   NOREEN ON 11/09/2023]  predniSONE   20 mg Oral Q breakfast   Followed by   NOREEN ON 11/10/2023] predniSONE   15 mg Oral Q breakfast   Followed by   NOREEN ON 11/11/2023] predniSONE   10 mg Oral Q breakfast   rOPINIRole   4 mg Oral QHS   senna-docusate  1 tablet Oral Daily   sertraline   50 mg Oral Daily   Continuous Infusions:  cefTRIAXone  (ROCEPHIN )  IV Stopped (11/02/23 2239)     Anti-infectives (From admission, onward)    Start     Dose/Rate Route Frequency Ordered Stop   10/31/23 2200  cefTRIAXone  (ROCEPHIN ) 1 g in sodium chloride  0.9 % 100 mL IVPB        1 g 200 mL/hr over 30 Minutes Intravenous Every 24 hours 10/31/23 1128 11/03/23 2359   10/30/23 2200  Vancomycin  (VANCOCIN ) 1,500 mg in sodium chloride  0.9 % 500 mL IVPB  Status:  Discontinued        1,500 mg 250 mL/hr over 120 Minutes  Intravenous Every 48 hours 10/28/23 1956 10/29/23 0741   10/29/23 1400  metroNIDAZOLE  (FLAGYL ) IVPB 500 mg  Status:  Discontinued        500 mg 100 mL/hr over 60 Minutes Intravenous Every 12 hours 10/29/23 1304 10/31/23 1427   10/28/23 2200  ceFEPIme  (MAXIPIME ) 2 g in sodium chloride  0.9 % 100 mL IVPB  Status:  Discontinued        2 g 200 mL/hr over 30 Minutes Intravenous Every 12 hours 10/28/23 2003 10/31/23 1127   10/28/23 2045  vancomycin  (VANCOCIN ) 1,500 mg in sodium chloride  0.9 % 500 mL IVPB        1,500 mg 257.5 mL/hr over 120 Minutes Intravenous  Once 10/28/23 1954 10/28/23 2050   10/04/23 1000  azithromycin  (ZITHROMAX ) tablet 500 mg        500 mg Oral Daily 10/03/23 0751 10/06/23 0856   10/03/23 0600  cefTRIAXone  (ROCEPHIN ) 2 g in sodium chloride  0.9 % 100 mL IVPB  Status:  Discontinued        2 g 200 mL/hr over 30 Minutes Intravenous Every 24 hours 10/02/23 0740 10/07/23 0854   10/02/23 0745  cefTRIAXone  (ROCEPHIN ) 2 g in sodium chloride  0.9 % 100 mL IVPB  Status:  Discontinued        2 g 200 mL/hr over 30 Minutes Intravenous Every 24 hours 10/02/23 0738 10/02/23 0740   10/02/23 0745  azithromycin  (ZITHROMAX ) 500 mg in sodium chloride  0.9 % 250 mL IVPB        500 mg 250 mL/hr over 60 Minutes Intravenous Every 24 hours 10/02/23 0738 10/03/23 1236   10/02/23 0630  cefTRIAXone  (ROCEPHIN ) 2 g in sodium chloride  0.9 % 100 mL IVPB        2 g 200 mL/hr over 30 Minutes Intravenous  Once 10/02/23 0617 10/02/23 0714              Family Communication/Anticipated D/C date and plan/Code Status   DVT prophylaxis: SCDs Start: 10/02/23 9261     Code Status: Do not attempt resuscitation (DNR) PRE-ARREST INTERVENTIONS DESIRED  Family Communication: None Disposition Plan: Plan to discharge to SNF    Status is: Inpatient Remains inpatient appropriate because: Awaiting placement to SNF       Subjective:   Interval events noted.  She has no complaints.  She feels  better.  Objective:    Vitals:   11/03/23 0700 11/03/23 0730 11/03/23 0752 11/03/23 0800  BP: (!) 108/58   124/65  Pulse: 84 80  82  Resp: 12 13  12   Temp:    97.6 F (36.4 C)  TempSrc:    Oral  SpO2: 95% 98% 96% 100%  Weight:      Height:       No data found.   Intake/Output Summary (Last 24 hours) at 11/03/2023 0900 Last data filed at 11/03/2023 0800 Gross per 24 hour  Intake 890.75 ml  Output 985 ml  Net -94.25 ml    Filed Weights   10/28/23 1431 11/02/23 0438 11/03/23 0411  Weight: 66.5 kg 68.1 kg 66.3 kg    Exam:   GEN: NAD SKIN: Warm and dry EYES: No pallor or icterus ENT: MMM CV: RRR PULM: CTA B ABD: soft, ND, NT, +BS CNS: AAO x 3, right hemiparesis EXT: No edema or tenderness     Data Reviewed:   I have personally reviewed following labs and imaging studies:  Labs: Labs show the following:   Basic Metabolic Panel: Recent Labs  Lab 10/29/23 0605 10/29/23 0811 10/30/23 0515 10/31/23 0415 11/01/23 0453 11/02/23 0414 11/03/23 0401  NA  --    < > 138 141 144 145 140  K  --    < > 3.9 3.5 2.9* 3.4* 4.1  CL  --    < > 101 107 108 105 99  CO2  --    < > 29 28 30  34* 34*  GLUCOSE  --    < > 115* 104* 104* 66* 90  BUN  --    < > 47* 33* 24* 21 14  CREATININE  --    < > 0.86 0.83 0.75 0.68 0.53  CALCIUM   --    < > 8.2* 8.2* 7.9* 8.2* 8.1*  MG 1.5*  --  2.1 2.4 2.4 2.1 2.0  PHOS 2.2*  --  3.0  --   --  1.8* 2.2*   < > = values in this interval not displayed.   GFR Estimated Creatinine Clearance: 56.4 mL/min (by C-G formula based on SCr of 0.53 mg/dL). Liver Function Tests: Recent Labs  Lab 10/28/23 1450  AST 30  ALT 27  ALKPHOS 71  BILITOT 0.6  PROT 5.5*  ALBUMIN 2.7*   No results for input(s): LIPASE, AMYLASE in the last 168 hours. Recent Labs  Lab 10/28/23 1804  AMMONIA 64*    Coagulation profile Recent Labs  Lab 10/28/23 1804  INR 1.2    CBC: Recent Labs  Lab 10/28/23 1450 10/28/23 1532 10/29/23 0023  10/31/23 0415 10/31/23 1030 10/31/23 1738 11/01/23 0453 11/02/23 0414 11/03/23 0401  WBC 17.6* 18.6*   < > 10.3 8.1  --  7.1 5.2 4.5  NEUTROABS 14.0* 14.9*  --   --   --   --   --   --   --   HGB 6.5* 6.4*   < > 8.0* 7.8* 8.2* 7.9* 8.1* 8.3*  HCT 19.9* 19.1*   < > 24.0* 24.3* 25.0* 25.2* 25.4* 26.4*  MCV 89.6 88.4   < > 90.2 94.6  --  96.6 94.4 96.4  PLT 595* 600*   < > 314 288  --  314 243 219   < > = values in this interval not displayed.   Cardiac Enzymes: No results for input(s): CKTOTAL, CKMB, CKMBINDEX, TROPONINI in the last 168 hours. BNP (last 3 results) No results for input(s): PROBNP in the last 8760 hours. CBG: Recent Labs  Lab 11/02/23 1632 11/02/23 1943 11/02/23 2333 11/03/23 0347 11/03/23 9261  GLUCAP  148* 142* 91 80 79   D-Dimer: No results for input(s): DDIMER in the last 72 hours. Hgb A1c: No results for input(s): HGBA1C in the last 72 hours. Lipid Profile: Recent Labs    11/01/23 0453  TRIG 255*   Thyroid  function studies: No results for input(s): TSH, T4TOTAL, T3FREE, THYROIDAB in the last 72 hours.  Invalid input(s): FREET3 Anemia work up: No results for input(s): VITAMINB12, FOLATE, FERRITIN, TIBC, IRON , RETICCTPCT in the last 72 hours. Sepsis Labs: Recent Labs  Lab 10/28/23 1450 10/28/23 1532 10/29/23 0605 10/29/23 0811 10/29/23 1044 10/29/23 1215 10/30/23 0100 10/30/23 0515 10/31/23 0415 10/31/23 1030 11/01/23 0453 11/02/23 0414 11/03/23 0401  PROCALCITON 0.16  --  0.57  --   --   --   --   --   --   --   --   --   --   WBC 17.6*   < > 23.5*  --   --  19.7* 17.2* 13.8*   < > 8.1 7.1 5.2 4.5  LATICACIDVEN 3.2*   < >  --    < > 2.6* 2.6* 0.9 0.9  --   --   --   --   --    < > = values in this interval not displayed.    Microbiology Recent Results (from the past 240 hours)  Respiratory (~20 pathogens) panel by PCR     Status: Abnormal   Collection Time: 10/25/23  4:15 PM   Specimen:  Nasopharyngeal Swab; Respiratory  Result Value Ref Range Status   Adenovirus NOT DETECTED NOT DETECTED Final   Coronavirus 229E NOT DETECTED NOT DETECTED Final    Comment: (NOTE) The Coronavirus on the Respiratory Panel, DOES NOT test for the novel  Coronavirus (2019 nCoV)    Coronavirus HKU1 NOT DETECTED NOT DETECTED Final   Coronavirus NL63 NOT DETECTED NOT DETECTED Final   Coronavirus OC43 NOT DETECTED NOT DETECTED Final   Metapneumovirus DETECTED (A) NOT DETECTED Final   Rhinovirus / Enterovirus NOT DETECTED NOT DETECTED Final   Influenza A NOT DETECTED NOT DETECTED Final   Influenza B NOT DETECTED NOT DETECTED Final   Parainfluenza Virus 1 NOT DETECTED NOT DETECTED Final   Parainfluenza Virus 2 NOT DETECTED NOT DETECTED Final   Parainfluenza Virus 3 NOT DETECTED NOT DETECTED Final   Parainfluenza Virus 4 NOT DETECTED NOT DETECTED Final   Respiratory Syncytial Virus NOT DETECTED NOT DETECTED Final   Bordetella pertussis NOT DETECTED NOT DETECTED Final   Bordetella Parapertussis NOT DETECTED NOT DETECTED Final   Chlamydophila pneumoniae NOT DETECTED NOT DETECTED Final   Mycoplasma pneumoniae NOT DETECTED NOT DETECTED Final    Comment: Performed at [facility], [address], [place], [place] [address]  SARS Coronavirus 2 by RT PCR (hospital order, performed in [facility] hospital lab) *cepheid single result test* Anterior Nasal Swab     Status: None   Collection Time: 10/25/23  4:15 PM   Specimen: Anterior Nasal Swab  Result Value Ref Range Status   SARS Coronavirus 2 by RT PCR NEGATIVE NEGATIVE Final    Comment: (NOTE) SARS-CoV-2 target nucleic acids are NOT DETECTED.  The SARS-CoV-2 RNA is generally detectable in upper and lower respiratory specimens during the acute phase of infection. The lowest concentration of SARS-CoV-2 viral copies this assay can detect is 250 copies / mL. A negative result does not preclude SARS-CoV-2 infection and should not be used as the  sole basis for treatment or other patient management  decisions.  A negative result may occur with improper specimen collection / handling, submission of specimen other than nasopharyngeal swab, presence of viral mutation(s) within the areas targeted by this assay, and inadequate number of viral copies (<250 copies / mL). A negative result must be combined with clinical observations, patient history, and epidemiological information.  Fact Sheet for Patients:   roadlaptop.co.za  Fact Sheet for Healthcare Providers: http://kim-miller.com/  This test is not yet approved or  cleared by the [place]  FDA and has been authorized for detection and/or diagnosis of SARS-CoV-2 by FDA under an Emergency Use Authorization (EUA).  This EUA will remain in effect (meaning this test can be used) for the duration of the COVID-19 declaration under Section 564(b)(1) of the Act, 21 U.S.C. section 360bbb-3(b)(1), unless the authorization is terminated or revoked sooner.  Performed at [facility], [address] [place], [place] [address]   Culture, blood (Routine X 2) w Reflex to ID Panel     Status: None   Collection Time: 10/28/23  3:23 PM   Specimen: BLOOD  Result Value Ref Range Status   Specimen Description BLOOD LEFT ANTECUBITAL  Final   Special Requests   Final    BOTTLES DRAWN AEROBIC AND ANAEROBIC Blood Culture adequate volume   Culture   Final    NO GROWTH 5 DAYS Performed at [facility], [address] [place], [place] [address]    Report Status 11/02/2023 FINAL  Final  Culture, blood (Routine X 2) w Reflex to ID Panel     Status: None   Collection Time: 10/28/23  3:32 PM   Specimen: BLOOD  Result Value Ref Range Status   Specimen Description BLOOD BLOOD LEFT HAND  Final   Special Requests   Final    BOTTLES DRAWN AEROBIC AND ANAEROBIC Blood Culture results may not be optimal due to an inadequate volume of blood  received in culture bottles   Culture   Final    NO GROWTH 5 DAYS Performed at [facility], [address], [place], [place] [address]    Report Status 11/02/2023 FINAL  Final  MRSA Next Gen by PCR, Nasal     Status: None   Collection Time: 10/28/23  4:58 PM   Specimen: Nasal Mucosa; Nasal Swab  Result Value Ref Range Status   MRSA by PCR Next Gen NOT DETECTED NOT DETECTED Final    Comment: (NOTE) The GeneXpert MRSA Assay (FDA approved for NASAL specimens only), is one component of a comprehensive MRSA colonization surveillance program. It is not intended to diagnose MRSA infection nor to guide or monitor treatment for MRSA infections. Test performance is not FDA approved in patients less than 32 years old. Performed at [facility], [address] [place], [place] [address]   Culture, Respiratory w Gram Stain     Status: None   Collection Time: 10/28/23  6:41 PM   Specimen: Tracheal Aspirate; Respiratory  Result Value Ref Range Status   Specimen Description   Final    TRACHEAL ASPIRATE Performed at [facility], [address] [place], [place] [address]    Special Requests   Final    NONE Performed at [facility], [address] [place], [place] [address]    Gram Stain   Final    RARE WBC PRESENT, PREDOMINANTLY PMN NO ORGANISMS SEEN    Culture   Final    RARE Normal respiratory flora-no Staph aureus or Pseudomonas seen Performed at [facility], [address], [place], [place] [address]    Report Status 10/31/2023 FINAL  Final    Procedures and diagnostic studies:  No results found.              LOS: 32 days   Tahjir Silveria  [employer] Loss Adjuster on [email or web address]. If 7PM-7AM, please contact night-coverage at [email or web address]     11/03/2023, 9:00 AM

## 2023-11-03 NOTE — Plan of Care (Signed)
  Problem: Fluid Volume: Goal: Ability to maintain a balanced intake and output will improve Outcome: Progressing   Problem: Health Behavior/Discharge Planning: Goal: Ability to identify and utilize available resources and services will improve Outcome: Progressing   Problem: Metabolic: Goal: Ability to maintain appropriate glucose levels will improve Outcome: Progressing   Problem: Nutritional: Goal: Maintenance of adequate nutrition will improve Outcome: Progressing   Problem: Skin Integrity: Goal: Risk for impaired skin integrity will decrease Outcome: Progressing   Problem: Clinical Measurements: Goal: Will remain free from infection Outcome: Progressing Goal: Diagnostic test results will improve Outcome: Progressing   Problem: Activity: Goal: Risk for activity intolerance will decrease Outcome: Progressing   Problem: Nutrition: Goal: Adequate nutrition will be maintained Outcome: Progressing

## 2023-11-03 NOTE — Progress Notes (Signed)
 Palliative Care Progress Note, Assessment & Plan   Patient Name: Michele House       Date: 11/03/2023 DOB: 10/04/1956  Age: 68 y.o. MRN#: [id number] Attending Physician: Jens Durand, MD Primary Care Physician: Center, [facility] Admit Date: 10/02/2023  Subjective: Patient is sitting up in bed.  She is asleep but easily awakens to my presence.  She is alert and oriented x 4.  No family or friends present during my visit.  She has no acute complaints at this time.  She is asking when she will be able to transfer out of the ICU.  HPI: 68 y.o. female  with past medical history of COPD with chronic respiratory failure on 2L [place] at baseline, T2DM, HFpEF and hypertension admitted on 10/02/2023 with AMS.   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   Imaging also revealed right upper lode lung nodule   Developed respiratory distress on 12/23--found to have metapneumovirus, stabilized but unfortunately developed worsening respiratory distress and required mechanical ventilation S/p bronchoscopy 12/27 biopsy of lung nodule performed-very rare atypical cells and mostly reactive alveolar cells   Course further complicated by acute anemia and development of shock S/p EGD oozing duodenal ulcer-clipped 12/26 S/p IR embolization to duodenal artery 12/28    Self extubated 12/27 soon after required reintubation   Palliative medicine was consulted for assisting with goals of care conversations.  Summary of counseling/coordination of care: Extensive chart review completed prior to meeting patient including labs, vital signs, imaging, progress  notes, orders, and available advanced directive documents from current and previous encounters.   After reviewing the patient's chart and assessing the patient at bedside, [facility] with patient in regards to symptom management and goals of care.  Symptoms assessed.  Patient denies complaints at this time.  She denies headache, chest pain, or other ailments.  No adjustment to [facility] needed.  Patient is asking when she will be able to transfer out of the ICU.  She says she is ready to go to rehab so she can get back home.  I attempted to elicit values and goals important to the patient.  She again reiterates that she would never want to be placed on a ventilator or be accepting of feeding tubes/artificial means to sustain her life.  She recalls our discussion from yesterday and shares that she remains clear that she would never want life support.  CODE STATUS changed to DNR with limited interventions.  Goals are clear.  Plan is for patient to transfer to SNF for rehab when medically stable.  Low symptom burden at this time.  After meeting with the patient, I spoke with her daughter over the phone.  Conveyed above.  She also remains in agreement for patient's CODE STATUS changed to DNR with limited interventions.  Daughter made aware that PMT will be stepping back and shadowing her chart.  Advised daughter that when discharge is closer that I would recommend completing a MOST form.  PMT colleagues will be here throughout the weekend weekend and available to assist with completion of MOST form should  they want to do this prior to discharge.  Patient/family have PMT contact information and were encouraged to contact PMT with any future acute palliative needs.   PMT will monitor the patient peripherally and follow up to assess for needs/support later this week.  Physical Exam Vitals reviewed.  Constitutional:      General: She is not in acute distress.    Appearance: She is normal weight.  Eyes:      Pupils: Pupils are equal, round, and reactive to light.  Cardiovascular:     Rate and Rhythm: Normal rate.  Musculoskeletal:     Comments: RUE and RLE weakness  Skin:    General: Skin is warm and dry.  Neurological:     Mental Status: She is alert and oriented to person, place, and time.  Psychiatric:        Mood and Affect: Mood is not anxious.        Behavior: Behavior normal. Behavior is not agitated.             Total Time 35 minutes   Time spent includes: Detailed review of medical records (labs, imaging, vital signs), medically appropriate exam (mental status, respiratory, cardiac, skin), discussed with treatment team, counseling and educating patient, family and staff, documenting clinical information, medication management and coordination of care.  Lamarr L. Arvid, DNP, FNP-BC Palliative Medicine Team

## 2023-11-03 NOTE — Progress Notes (Signed)
 NAME:  Michele House, MRN:  [id number], DOB:  12-15-1955, LOS: 32 ADMISSION DATE:  10/02/2023   History of Present Illness:  Case of a 68 year old female patient with a past medical history of COPD with chronic respiratory failure on 2 L nasal cannula, hypertension, type 2 diabetes, heart failure with preserved EF who has been here for a month.  Initially presented with altered mental status encephalopathy.  Had an extensive workup including brain MRI which was negative for any acute intracranial abnormality initially.  However unfortunately course complicated by acute CVA with repeat MRI showing multiple acute infarcts in the high bilateral posterior frontal and parietal lobes.  She recovered from that but course further complicated by waxing and waning mental status which prompted an LP to rule out encephalitis and has been unremarkable so far.  She was found to have right upper lobe lung nodule which was concerning for malignancy and underwent robotic assisted navigational bronchoscopy with biopsy on 12/27 which showed very rare atypical cells and reactive alveolar cells mostly.    Pertinent  Medical History  As abnove  Significant Hospital Events: Including procedures, antibiotic start and stop dates in addition to other pertinent events   12/01: Vital stable, respiratory viral panel negative, procalcitonin negative, preliminary blood cultures negative.  Urine cultures are ordered as add-on, ammonia levels mildly elevated at 39, strep pneumo negative, CBG elevated at 244, history of enlarging pulmonary nodule with recommendations for PET/CT during most recent admission which has not been done yet, if video bronchoscopy was scheduled for 10/13/2023 as outpatient. 12/02: Unable to take care of herself and son cannot provide the required care as she required 24-hour supervision due to worsening dementia.  PT is recommending SNF. 12/03: Blood pressure started trending up, restarting home  Zestoretic .  Urine cultures with strep agalactiae, penicillin allergy noted, will continue with ceftriaxone  for now.  Also started on Remeron  for concern of worsening depression and unable to sleep at night  12/05:  Patient has some blurry vision and weakness and pain.  MRI of the brain negative for acute intracranial abnormality. 12/08:  Patient more confused today than yesterday.  Able to follow some simple commands but not as good of a conversation today as yesterday. 12/09:  Patient still confused today.  Does not follow simple commands today. 12/10: Patient more talkative today but was looking up at the ceiling when she was talking with me.  She does not move her extremities to command but does move them on her own.  Physical therapy and Occupational Therapy did not see her move her right arm. 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  Mild associated petechial hemorrhage.  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 12/12: Patient with new stroke, CTA head and neck with no large vessel occlusion.  Patient had a recent echocardiogram which was normal. Concern of paraneoplastic versus hypercoagulability secondary to malignancy.  Patient missed her appointment for bronchoscopy and outpatient appointment for PET scan due to recurrent hospitalizations. 12/13: Patient continued to have waxing and waning mental status, unable to move right upper and lower extremities.  Having some hallucination.  Pulmonary is planning lung biopsy on 12/27 if she remains stable.  Rapidly progressive dementia and now with underlying stroke, question of paraneoplastic encephalitis and hypercoagulability secondary to underlying undiagnosed malignancy. 12/15: Patient with much  improved mentation.  Having some flickering of movements on right  upper and lower extremity today, left extremities weaker but seems improving.   12/18: Pt underwent bronchoscopy and lung biopsy 12/19: patient stable , she is being optimized for dc for rehab.  She's cleared from pulmonary for discharge. Results from bronch will take 1 week.   12/26: Pt transferred to the stepdown unit with acute hypercapnic respiratory requiring Bipap but subsequently required mechanical intubation due to worsening respiratory failure.  Insulin  gtt initiated due to severe hyperglycemia.  With hemorrhagic shock due to bleeding duodenal ulcer, underwent emergent EGD with successful hemostasis by injection of Epinephrine  and hemostatic clip. 12/27: Hgb slowly trending down, no report of bleeding from OG suction nor melena. Weaning down vasopressors, if able to continue to wean vasopressors, then can consider WUA/SBT.  Giving temporizing measures for Hyperkalemia. Self extubated but required reintubation. Hgb continued to trend down, given 1 unit pRBC's and 2 FFP.  IR consulted for consideration for embolization. 12/28: Weaned off Vasopressin , Levophed  weaning down.  S/p IR embolization of the Duodenal artery. Peak pressures and lung sounds improved with Ketamine , will keep intubated today given rapid reintubation yesterday. 12/30- patient on PRVC FiO2 28%, weaning off ketamine  and hoping to perform SBT today.  Holding feeds until post SBT.  11/02/23- patient is stable overnight no acute events, electrllytes improved.  No GI bleeds noted.  Opitimizing for TRH today.  11/03/23 patient is more interactive this am, still has not been able to get OOB.  Will continue to follow her from pulmonary with COPD and lung cancer. Her pathology was sent for second opinion due to mixed reporting from cytotech and pathology report.   Interim History / Subjective:  Remains Intubated with significant wheezing and chest tightness.   Objective   Blood pressure 124/65, pulse 82, temperature 97.6 F (36.4 C),  temperature source Oral, resp. rate 12, height 5' (1.524 m), weight 66.3 kg, last menstrual period 12/24/1998, SpO2 100%.    FiO2 (%):  [28 %] 28 %   Intake/Output Summary (Last 24 hours) at 11/03/2023 0932 Last data filed at 11/03/2023 0913 Gross per 24 hour  Intake 935.75 ml  Output 985 ml  Net -49.25 ml   Filed Weights   10/28/23 1431 11/02/23 0438 11/03/23 0411  Weight: 66.5 kg 68.1 kg 66.3 kg    Examination: General: Awake, follows simple commands  HENT: Supple neck reactive pupils  Lungs: Diffuse wheezing and diminished air movement.  Cardiovascular: Normal S1, Normal S2, RRR  Abdomen: Soft, non tender, non distended, +BS  Extremities: Warm and well perfused no edema.   Labs and imaging were reviewed.   Assessment & Plan:  Case of a 68 year old female patient presenting 4 weeks ago with confusion and altered mental status with course c/b AHRF requring intubation 12/26 likely secondary to metapneumovirus and COPD exacerbation. Course complicated by hemorrhagic shock from a duodenal ulcer s/p EGD 12/26 with clip placed s/p IR embolization 10/30/2023. Remains intubated with significant wheezing and chest tightness.   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  This has been sent for second opinion to be reviewed.  I think its most likely primary lung cancer.    Best Practice (right click and Reselect all SmartList Selections daily)   Diet/type: NPO DVT prophylaxis SCD Pressure  ulcer(s): N/A GI prophylaxis: PPI Lines: Central line Foley:  Yes, and it is still needed Code Status:  full code     Cecilia Vancleve, M.D.  Pulmonary & Critical Care Medicine

## 2023-11-03 NOTE — TOC Progression Note (Signed)
 Transition of Care [facility]) - Progression Note    Patient Details  Name: Michele House MRN: [id number] Date of Birth: [birth year]  Transition of Care [facility]) CM/SW Contact  Lauraine JAYSON Carpen, LCSW Phone Number: 11/03/2023, 3:58 PM  Clinical Narrative:   CSW continues to follow for discharge needs. Please notify TOC once closer to discharge to insurance authorization can be started and SNF can order bipap machine.  Expected Discharge Plan: Skilled Nursing Facility Barriers to Discharge: Continued Medical Work up  Expected Discharge Plan and Services                                               Social Determinants of Health (SDOH) Interventions SDOH Screenings   Food Insecurity: Patient Unable To Answer (10/03/2023)  Recent Concern: Food Insecurity - Food Insecurity Present (09/24/2023)  Housing: High Risk (10/03/2023)  Transportation Needs: Patient Unable To Answer (10/03/2023)  Recent Concern: Transportation Needs - Unmet Transportation Needs (09/24/2023)  Utilities: Patient Unable To Answer (10/03/2023)  Social Connections: Patient Declined (11/02/2023)  Tobacco Use: Medium Risk (10/20/2023)    Readmission Risk Interventions     No data to display

## 2023-11-04 DIAGNOSIS — G9341 Metabolic encephalopathy: Secondary | ICD-10-CM | POA: Diagnosis not present

## 2023-11-04 LAB — CBC
HCT: 28 % — ABNORMAL LOW (ref 36.0–46.0)
Hemoglobin: 8.9 g/dL — ABNORMAL LOW (ref 12.0–15.0)
MCH: 30.7 pg (ref 26.0–34.0)
MCHC: 31.8 g/dL (ref 30.0–36.0)
MCV: 96.6 fL (ref 80.0–100.0)
Platelets: 208 10*3/uL (ref 150–400)
RBC: 2.9 MIL/uL — ABNORMAL LOW (ref 3.87–5.11)
RDW: 18.4 % — ABNORMAL HIGH (ref 11.5–15.5)
WBC: 5.1 10*3/uL (ref 4.0–10.5)
nRBC: 0 % (ref 0.0–0.2)

## 2023-11-04 LAB — GLUCOSE, CAPILLARY
Glucose-Capillary: 94 mg/dL (ref 70–99)
Glucose-Capillary: 110 mg/dL — ABNORMAL HIGH (ref 70–99)
Glucose-Capillary: 252 mg/dL — ABNORMAL HIGH (ref 70–99)
Glucose-Capillary: 302 mg/dL — ABNORMAL HIGH (ref 70–99)
Glucose-Capillary: 211 mg/dL — ABNORMAL HIGH (ref 70–99)
Glucose-Capillary: 220 mg/dL — ABNORMAL HIGH (ref 70–99)
Glucose-Capillary: 159 mg/dL — ABNORMAL HIGH (ref 70–99)

## 2023-11-04 LAB — BASIC METABOLIC PANEL
Anion gap: 9 (ref 5–15)
BUN: 11 mg/dL (ref 8–23)
CO2: 33 mmol/L — ABNORMAL HIGH (ref 22–32)
Calcium: 8.3 mg/dL — ABNORMAL LOW (ref 8.9–10.3)
Chloride: 97 mmol/L — ABNORMAL LOW (ref 98–111)
Creatinine, Ser: 0.48 mg/dL (ref 0.44–1.00)
GFR, Estimated: >60 mL/min (ref >60)
Glucose, Bld: 112 mg/dL — ABNORMAL HIGH (ref 70–99)
Potassium: 3.8 mmol/L (ref 3.5–5.1)
Sodium: 139 mmol/L (ref 135–145)

## 2023-11-04 LAB — PHOSPHORUS: Phosphorus: 2.8 mg/dL (ref 2.5–4.6)

## 2023-11-04 LAB — MAGNESIUM: Magnesium: 2.1 mg/dL (ref 1.7–2.4)

## 2023-11-04 MED ORDER — INSULIN ASPART 100 UNIT/ML IJ SOLN
0.0000 [IU] | Freq: Every day | INTRAMUSCULAR | Status: DC
  Administered 2023-11-04: 2 [IU] via SUBCUTANEOUS
  Administered 2023-11-06 – 2023-11-07 (×2): 4 [IU] via SUBCUTANEOUS
  Administered 2023-11-10 (×2): 3 [IU] via SUBCUTANEOUS
  Administered 2023-11-12 – 2023-11-13 (×2): 2 [IU] via SUBCUTANEOUS
  Administered 2023-11-14: 4 [IU] via SUBCUTANEOUS
  Administered 2023-11-17: 3 [IU] via SUBCUTANEOUS
  Administered 2023-11-18 – 2023-11-23 (×4): 2 [IU] via SUBCUTANEOUS
  Filled 2023-11-04 (×13): qty 1

## 2023-11-04 MED ORDER — INSULIN ASPART 100 UNIT/ML IJ SOLN: 0.0000 [IU] | Freq: Three times a day (TID) | INTRAMUSCULAR | Status: DC

## 2023-11-04 MED ORDER — FUROSEMIDE 20 MG PO TABS
20.0000 mg | ORAL_TABLET | Freq: Every day | ORAL | Status: DC
Start: 2023-11-04 — End: 2023-12-03
  Administered 2023-11-04 – 2023-12-02 (×29): 20 mg via ORAL
  Filled 2023-11-04 (×29): qty 1

## 2023-11-04 MED ORDER — INSULIN ASPART 100 UNIT/ML IJ SOLN
0.0000 [IU] | Freq: Three times a day (TID) | INTRAMUSCULAR | Status: DC
Start: 2023-11-04 — End: 2023-12-03
  Administered 2023-11-04: 7 [IU] via SUBCUTANEOUS
  Administered 2023-11-05: 2 [IU] via SUBCUTANEOUS
  Administered 2023-11-05 (×2): 5 [IU] via SUBCUTANEOUS
  Administered 2023-11-06: 2 [IU] via SUBCUTANEOUS
  Administered 2023-11-06: 3 [IU] via SUBCUTANEOUS
  Administered 2023-11-06: 2 [IU] via SUBCUTANEOUS
  Administered 2023-11-07: 9 [IU] via SUBCUTANEOUS
  Administered 2023-11-07: 3 [IU] via SUBCUTANEOUS
  Administered 2023-11-08: 2 [IU] via SUBCUTANEOUS
  Administered 2023-11-08: 1 [IU] via SUBCUTANEOUS
  Administered 2023-11-08: 5 [IU] via SUBCUTANEOUS
  Administered 2023-11-09: 2 [IU] via SUBCUTANEOUS
  Administered 2023-11-09: 5 [IU] via SUBCUTANEOUS
  Administered 2023-11-10: 2 [IU] via SUBCUTANEOUS
  Administered 2023-11-10: 7 [IU] via SUBCUTANEOUS
  Administered 2023-11-10: 9 [IU] via SUBCUTANEOUS
  Administered 2023-11-11 – 2023-11-12 (×4): 2 [IU] via SUBCUTANEOUS
  Administered 2023-11-12: 1 [IU] via SUBCUTANEOUS
  Administered 2023-11-13: 5 [IU] via SUBCUTANEOUS
  Administered 2023-11-13 – 2023-11-14 (×2): 3 [IU] via SUBCUTANEOUS
  Administered 2023-11-14: 5 [IU] via SUBCUTANEOUS
  Administered 2023-11-14: 3 [IU] via SUBCUTANEOUS
  Administered 2023-11-15: 2 [IU] via SUBCUTANEOUS
  Administered 2023-11-15 (×2): 5 [IU] via SUBCUTANEOUS
  Administered 2023-11-16: 3 [IU] via SUBCUTANEOUS
  Administered 2023-11-16: 5 [IU] via SUBCUTANEOUS
  Administered 2023-11-16: 3 [IU] via SUBCUTANEOUS
  Administered 2023-11-17: 2 [IU] via SUBCUTANEOUS
  Administered 2023-11-17: 5 [IU] via SUBCUTANEOUS
  Administered 2023-11-17 – 2023-11-20 (×9): 3 [IU] via SUBCUTANEOUS
  Administered 2023-11-21: 1 [IU] via SUBCUTANEOUS
  Administered 2023-11-21: 2 [IU] via SUBCUTANEOUS
  Administered 2023-11-21: 1 [IU] via SUBCUTANEOUS
  Administered 2023-11-22: 3 [IU] via SUBCUTANEOUS
  Administered 2023-11-22: 5 [IU] via SUBCUTANEOUS
  Administered 2023-11-22 – 2023-11-23 (×2): 3 [IU] via SUBCUTANEOUS
  Administered 2023-11-23: 2 [IU] via SUBCUTANEOUS
  Administered 2023-11-23: 1 [IU] via SUBCUTANEOUS
  Administered 2023-11-24 (×2): 3 [IU] via SUBCUTANEOUS
  Administered 2023-11-24: 5 [IU] via SUBCUTANEOUS
  Administered 2023-11-25: 3 [IU] via SUBCUTANEOUS
  Administered 2023-11-25 (×2): 2 [IU] via SUBCUTANEOUS
  Administered 2023-11-26: 3 [IU] via SUBCUTANEOUS
  Administered 2023-11-26: 2 [IU] via SUBCUTANEOUS
  Administered 2023-11-26: 3 [IU] via SUBCUTANEOUS
  Administered 2023-11-27: 1 [IU] via SUBCUTANEOUS
  Administered 2023-11-27: 5 [IU] via SUBCUTANEOUS
  Administered 2023-11-27: 2 [IU] via SUBCUTANEOUS
  Administered 2023-11-28: 3 [IU] via SUBCUTANEOUS
  Administered 2023-11-28 (×2): 2 [IU] via SUBCUTANEOUS
  Administered 2023-11-29 (×2): 1 [IU] via SUBCUTANEOUS
  Administered 2023-11-29: 3 [IU] via SUBCUTANEOUS
  Administered 2023-11-30: 2 [IU] via SUBCUTANEOUS
  Administered 2023-11-30: 3 [IU] via SUBCUTANEOUS
  Administered 2023-11-30: 5 [IU] via SUBCUTANEOUS
  Administered 2023-12-01: 3 [IU] via SUBCUTANEOUS
  Administered 2023-12-01: 2 [IU] via SUBCUTANEOUS
  Administered 2023-12-01: 3 [IU] via SUBCUTANEOUS
  Administered 2023-12-02 (×3): 2 [IU] via SUBCUTANEOUS
  Filled 2023-11-04 (×79): qty 1

## 2023-11-04 MED ORDER — INSULIN ASPART 100 UNIT/ML IJ SOLN
3.0000 [IU] | Freq: Three times a day (TID) | INTRAMUSCULAR | Status: DC
Start: 2023-11-04 — End: 2023-11-09
  Administered 2023-11-04 – 2023-11-09 (×15): 3 [IU] via SUBCUTANEOUS
  Filled 2023-11-04 (×14): qty 1

## 2023-11-04 MED ORDER — INSULIN ASPART 100 UNIT/ML IJ SOLN: 0.0000 [IU] | Freq: Every day | INTRAMUSCULAR | Status: DC

## 2023-11-04 NOTE — Progress Notes (Signed)
 Progress Note    Michele House  FMW:[id number] DOB: [birth year]  DOA: 10/02/2023 PCP: [facility]      Brief Narrative:    Medical records reviewed and are as summarized below:  Michele House is a 67 y.o. female with medical history significant of COPD, chronic respiratory failure on 2 L, hypertension, type 2 diabetes, HFpEF, recent discharge from the hospital 24th November 2024 after hospitalization for COPD exacerbation complicated by respiratory failure.  She presented to the hospital again on 10/02/2023 with chest pain, malaise, cough, shortness of breath, abdominal pain and dysuria.  She was admitted to the hospital for COPD exacerbation, acute on chronic hypoxemic respiratory failure, UTI and pneumonia.  She became more confused in the hospital and MRI brain revealed acute stroke.        12/1: Vital stable, respiratory viral panel negative, procalcitonin negative, preliminary blood cultures negative.  Urine cultures are ordered as add-on, ammonia levels mildly elevated at 39, strep pneumo negative, CBG elevated at 244, history of enlarging pulmonary nodule with recommendations for PET/CT during most recent admission which has not been done yet, if video bronchoscopy was scheduled for 10/13/2023 as outpatient.  Unable to take care of himself and son cannot provide the required care as she required 24-hour supervision due to worsening dementia.  PT is recommending SNF.  12/2: Hemodynamically stable, now on baseline oxygen of 2 L.  Needs SNF placement. Urine cultures pending.  12/3: Blood pressure started trending up, restarting home Zestoretic .  Urine cultures with strep agalactiae, penicillin allergy noted, will continue with ceftriaxone  for now.  Also started on Remeron  for concern of worsening depression and unable to sleep at night  12/4.  Some very concerned about patient's mental status not being seen.  Patient receiving antibiotics already  and should have improved by now.  Will hold Lipitor.  Restart low-dose Zoloft .  Discontinue Remeron  and do Seroquel  at night. 12/5.  Patient has some blurry vision and weakness and pain.  MRI of the brain negative for acute intracranial abnormality. 12/6.  Patient stated she slept well.  Patient able to lift up her arms up off the bed today.  Patient thought she heard her son's voice outside the room. 12/7.  Patient sitting up in chair when I saw her.  Was able to feed herself breakfast.  Felt okay.  Did not offer any complaints. 12/8.  Patient more confused today than yesterday.  Able to follow some simple commands but not as good of a conversation today as yesterday. 12/9.  Patient still confused today.  Does not follow simple commands today. 12/10.  Patient more talkative today but was looking up at the ceiling when she was talking with me.  She does not move her extremities to command but does move them on her own.  Physical therapy and Occupational Therapy did not see her move her right arm. 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  Mild associated petechial hemorrhage.  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 12/12: Patient with new stroke, CTA head and neck with no large vessel occlusion.  Patient had a recent echocardiogram which was normal. CSF cultures remain negative.  Lipid panel with increased triglyceride at 347, HDL 29 and LDL of 38.  CBC with mild thrombocytopenia at 130, BMP mostly unremarkable.  Patient with significant right-sided weakness. Concern of paraneoplastic versus hypercoagulability secondary to malignancy.  Patient missed her appointment for bronchoscopy and outpatient appointment for PET scan due to recurrent hospitalizations. Involving pulmonary and oncology for their input. Send  out encephalitis panel still pending.  12/13: Patient continued to have waxing and waning mental status, unable to move right upper and lower extremities.  Having some hallucination.  Pulmonary is planning lung biopsy on 12/27 if she remains stable.  Rapidly progressive dementia and now with underlying stroke, question of paraneoplastic encephalitis and hypercoagulability secondary to underlying undiagnosed malignancy. Patient will also get benefit from neuropsych evaluation as outpatient.  12/14: Patient with some improved mentation today.  Trying to squeeze with right hand but still no movements of right lower extremity.  12/15: Patient with much improved mentation.  Having some flickering of movements on right upper and lower extremity today, left extremities weaker but seems improving.   12/16: Mental status seems stable, flaccid right upper and lower extremities.  Complaining of muscle spasms so Robaxin  was ordered.  Pulmonary is trying to do bronchoscopy and biopsy on Wednesday.  12/18: Pt underwent bronchoscopy and lung biopsy 12/19: patient stable , she is being optimized for dc for rehab.  She's cleared from pulmonary for discharge. Results from bronch will take 1 week.   12/26: Pt transferred to the stepdown unit with acute hypercapnic respiratory requiring Bipap but subsequently required mechanical intubation due to worsening respiratory failure.  Insulin  gtt initiated due to severe hyperglycemia.  With hemorrhagic shock due to bleeding duodenal ulcer, underwent emergent EGD with successful hemostasis by injection of Epinephrine  and hemostatic clip. 12/27: Hgb slowly trending down, no report of bleeding from OG suction nor melena. Weaning down vasopressors, if able to continue to wean vasopressors, then can consider WUA/SBT.  Giving temporizing measures for Hyperkalemia. Self extubated but required reintubation. Hgb continued to trend down, given 1 unit pRBC's and 2 FFP.  IR consulted for  consideration for embolization. 12/28: Weaned off Vasopressin , Levophed  weaning down.  S/p IR embolization of the Duodenal artery. Peak pressures and lung sounds improved with Ketamine , will keep intubated today given rapid reintubation yesterday. 12/30- patient on PRVC FiO2 28%, weaning off ketamine  and hoping to perform SBT today.  Holding feeds until post SBT.  11/02/23- patient is stable overnight no acute events, electrllytes improved.  No GI bleeds noted.  Opitimizing for TRH today.     Assessment/Plan:   Principal Problem:   Acute metabolic encephalopathy Active Problems:   CVA (cerebral vascular accident) (HCC)   Uncontrolled type 2 diabetes mellitus with hypoglycemia, without long-term current use of insulin  (HCC)   Hypotension   Acute on chronic respiratory failure with hypoxia (HCC)   Hypernatremia   COPD exacerbation (HCC)   Lung nodule   Pneumonia   UTI (urinary tract infection)   (HFpEF) heart failure with preserved ejection fraction (HCC)   Essential hypertension   Type 2 diabetes mellitus with obesity (HCC)   Melena   Gastrointestinal hemorrhage with melena   Duodenal ulcer    Body mass index is 28.55 kg/m.   Acute metabolic encephalopathy: Improved. Initially completed 5 days of high-dose several IV Solu-Medrol  on 10/12/2023 Differential diagnosis include autoimmune encephalitis   Acute stroke with right hemiplegia: Off of Plavix  because of recent acute GI bleeding.  Right-sided weakness slowly improving. 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   Acute GI bleeding, bleeding duodenal ulcer: Continue Protonix . S/p EGD on 10/28/2023 with clip placed.  S/p IR embolization on 10/30/2023.     Hemorrhagic shock: Resolved Hypotension: Resolved.  Off of  vasopressors   Acute blood loss anemia: H&H is stable.  S/p transfusion with 2 units of PRBCs on 10/28/2023, 1 unit of PRBCs on 10/29/2023.  S/p transfusion with 2 units of platelet pheresis on 10/29/2023.   Pneumonia: Initially completed 5 days of IV ceftriaxone  and azithromycin  on 10/07/2023. She was restarted on broad-spectrum antibiotics (vancomycin , ceftriaxone  and Flagyl ) on 10/28/2023 when she decompensated requiring transfer to the ICU. Acute UTI: Urine culture showed strep agalactiae.  Completed antibiotics.   Acute on chronic diastolic CHF: Stable.  S/p treatment with IV Lasix .  Restart oral Lasix . Anxiety/panic attacks: Ativan  as needed.  Continue sertraline    COPD exacerbation: This had previously resolved.  However, she had recurrent COPD exacerbation and was restarted on steroids. Continue prednisone  taper.   Acute on chronic hypoxemic respiratory failure, chronic hypercapnic respiratory failure: She was intubated on 10/28/2023 and extubated on 11/01/2023. She is tolerating 2 L/min oxygen via nasal cannula. Use BiPAP nightly.   Type II DM with severe hyperglycemia: Glucose levels stable.  Continue Semglee  and NovoLog  as needed for hyperglycemia.-Scheduled NovoLog  3 units 3 times daily with meals.. S/p treatment with IV insulin  drip.   1.3 cm right upper lung spiculated nodule: S/p bronchoscopy on 10/20/2023.   Pathology was negative for malignancy and findings more consistent with reactive bronchial cells with acute inflammation.   General weakness: PT and OT recommended discharge to SNF.   Patient was transferred to the hospitalist service on 11/03/2023. Transfer from progressive cardiac unit to MedSurg unit.     Diet Order             Diet Carb Modified Fluid consistency: Thin  Diet effective now                            Consultants: Community Education Officer Interventional  radiologist  Procedures: Lumbar puncture on 10/08/2023 Bronchoscopy on 10/20/2023 Intubation and mechanical ventilation on 10/28/2023 EGD on 10/28/2023 IR embolization of duodenal artery on 10/30/2023    Medications:    arformoterol   15 mcg Nebulization BID   Chlorhexidine  Gluconate Cloth  6 each Topical Daily   docusate sodium   100 mg Oral BID   feeding supplement  237 mL Oral BID BM   insulin  aspart  0-5 Units Subcutaneous QHS   insulin  aspart  0-9 Units Subcutaneous TID WC   insulin  aspart  3 Units Subcutaneous TID WC   insulin  glargine-yfgn  10 Units Subcutaneous Daily   ipratropium-albuterol   3 mL Nebulization TID   levothyroxine   100 mcg Oral Q0600   multivitamin with minerals  1 tablet Oral Daily   mouth rinse  15 mL Mouth Rinse BID   pantoprazole   40 mg Oral BID   polyethylene glycol  17 g Oral Daily   [START ON 11/05/2023] predniSONE   40 mg Oral Q breakfast   Followed by   NOREEN ON 11/06/2023] predniSONE   35 mg Oral Q breakfast   Followed by   NOREEN ON 11/07/2023] predniSONE   30 mg Oral Q breakfast   Followed by   NOREEN ON 11/08/2023] predniSONE   25 mg Oral Q breakfast   Followed by   NOREEN ON 11/09/2023] predniSONE   20 mg Oral Q breakfast   Followed  by   NOREEN ON 11/10/2023] predniSONE   15 mg Oral Q breakfast   Followed by   NOREEN ON 11/11/2023] predniSONE   10 mg Oral Q breakfast   rOPINIRole   4 mg Oral QHS   senna-docusate  1 tablet Oral Daily   sertraline   50 mg Oral Daily   Continuous Infusions:     Anti-infectives (From admission, onward)    Start     Dose/Rate Route Frequency Ordered Stop   10/31/23 2200  cefTRIAXone  (ROCEPHIN ) 1 g in sodium chloride  0.9 % 100 mL IVPB        1 g 200 mL/hr over 30 Minutes Intravenous Every 24 hours 10/31/23 1128 11/03/23 2216   10/30/23 2200  Vancomycin  (VANCOCIN ) 1,500 mg in sodium chloride  0.9 % 500 mL IVPB  Status:  Discontinued        1,500 mg 250 mL/hr over 120 Minutes Intravenous Every 48 hours 10/28/23 1956  10/29/23 0741   10/29/23 1400  metroNIDAZOLE  (FLAGYL ) IVPB 500 mg  Status:  Discontinued        500 mg 100 mL/hr over 60 Minutes Intravenous Every 12 hours 10/29/23 1304 10/31/23 1427   10/28/23 2200  ceFEPIme  (MAXIPIME ) 2 g in sodium chloride  0.9 % 100 mL IVPB  Status:  Discontinued        2 g 200 mL/hr over 30 Minutes Intravenous Every 12 hours 10/28/23 2003 10/31/23 1127   10/28/23 2045  vancomycin  (VANCOCIN ) 1,500 mg in sodium chloride  0.9 % 500 mL IVPB        1,500 mg 257.5 mL/hr over 120 Minutes Intravenous  Once 10/28/23 1954 10/28/23 2050   10/04/23 1000  azithromycin  (ZITHROMAX ) tablet 500 mg        500 mg Oral Daily 10/03/23 0751 10/06/23 0856   10/03/23 0600  cefTRIAXone  (ROCEPHIN ) 2 g in sodium chloride  0.9 % 100 mL IVPB  Status:  Discontinued        2 g 200 mL/hr over 30 Minutes Intravenous Every 24 hours 10/02/23 0740 10/07/23 0854   10/02/23 0745  cefTRIAXone  (ROCEPHIN ) 2 g in sodium chloride  0.9 % 100 mL IVPB  Status:  Discontinued        2 g 200 mL/hr over 30 Minutes Intravenous Every 24 hours 10/02/23 0738 10/02/23 0740   10/02/23 0745  azithromycin  (ZITHROMAX ) 500 mg in sodium chloride  0.9 % 250 mL IVPB        500 mg 250 mL/hr over 60 Minutes Intravenous Every 24 hours 10/02/23 0738 10/03/23 1236   10/02/23 0630  cefTRIAXone  (ROCEPHIN ) 2 g in sodium chloride  0.9 % 100 mL IVPB        2 g 200 mL/hr over 30 Minutes Intravenous  Once 10/02/23 0617 10/02/23 0714              Family Communication/Anticipated D/C date and plan/Code Status   DVT prophylaxis: SCDs Start: 10/02/23 9261     Code Status: Do not attempt resuscitation (DNR) PRE-ARREST INTERVENTIONS DESIRED  Family Communication: None Disposition Plan: Plan to discharge to SNF    Status is: Inpatient Remains inpatient appropriate because: Awaiting placement to SNF       Subjective:   Interval events noted.  She said her swallowing is improved and she is able to swallow tolerate regular  diet.  No shortness of breath or chest pain.  Overall, she feels better.  Geni, RN was at the bedside.  Objective:    Vitals:   11/04/23 0700 11/04/23 0800 11/04/23 0900 11/04/23 1000  BP:  133/68 (!) 143/67 120/63 123/75  Pulse: 89 90 94 95  Resp: 16 19 18 17   Temp:  98.4 F (36.9 C)    TempSrc:  Oral    SpO2: 98% 98% 95% 96%  Weight:      Height:       No data found.   Intake/Output Summary (Last 24 hours) at 11/04/2023 1526 Last data filed at 11/04/2023 1330 Gross per 24 hour  Intake 364.42 ml  Output 250 ml  Net 114.42 ml    Filed Weights   10/28/23 1431 11/02/23 0438 11/03/23 0411  Weight: 66.5 kg 68.1 kg 66.3 kg    Exam:   GEN: NAD SKIN: Warm and dry EYES: No pallor or icterus ENT: MMM CV: RRR PULM: CTA B ABD: soft, ND, NT, +BS CNS: AAO x 3, right hemiparesis EXT: No edema or tenderness      Data Reviewed:   I have personally reviewed following labs and imaging studies:  Labs: Labs show the following:   Basic Metabolic Panel: Recent Labs  Lab 10/29/23 0605 10/29/23 0811 10/30/23 0515 10/31/23 0415 11/01/23 0453 11/02/23 0414 11/03/23 0401 11/04/23 0511  NA  --    < > 138 141 144 145 140 139  K  --    < > 3.9 3.5 2.9* 3.4* 4.1 3.8  CL  --    < > 101 107 108 105 99 97*  CO2  --    < > 29 28 30  34* 34* 33*  GLUCOSE  --    < > 115* 104* 104* 66* 90 112*  BUN  --    < > 47* 33* 24* 21 14 11   CREATININE  --    < > 0.86 0.83 0.75 0.68 0.53 0.48  CALCIUM   --    < > 8.2* 8.2* 7.9* 8.2* 8.1* 8.3*  MG 1.5*  --  2.1 2.4 2.4 2.1 2.0 2.1  PHOS 2.2*  --  3.0  --   --  1.8* 2.2* 2.8   < > = values in this interval not displayed.   GFR Estimated Creatinine Clearance: 56.4 mL/min (by C-G formula based on SCr of 0.48 mg/dL). Liver Function Tests: No results for input(s): AST, ALT, ALKPHOS, BILITOT, PROT, ALBUMIN in the last 168 hours.  No results for input(s): LIPASE, AMYLASE in the last 168 hours. Recent Labs  Lab 10/28/23 1804   AMMONIA 64*    Coagulation profile Recent Labs  Lab 10/28/23 1804  INR 1.2    CBC: Recent Labs  Lab 10/28/23 1532 10/29/23 0023 10/31/23 1030 10/31/23 1738 11/01/23 0453 11/02/23 0414 11/03/23 0401 11/04/23 0511  WBC 18.6*   < > 8.1  --  7.1 5.2 4.5 5.1  NEUTROABS 14.9*  --   --   --   --   --   --   --   HGB 6.4*   < > 7.8* 8.2* 7.9* 8.1* 8.3* 8.9*  HCT 19.1*   < > 24.3* 25.0* 25.2* 25.4* 26.4* 28.0*  MCV 88.4   < > 94.6  --  96.6 94.4 96.4 96.6  PLT 600*   < > 288  --  314 243 219 208   < > = values in this interval not displayed.   Cardiac Enzymes: No results for input(s): CKTOTAL, CKMB, CKMBINDEX, TROPONINI in the last 168 hours. BNP (last 3 results) No results for input(s): PROBNP in the last 8760 hours. CBG: Recent Labs  Lab 11/03/23 1934 11/03/23 2327 11/04/23 0405 11/04/23  9281 11/04/23 1304  GLUCAP 215* 82 94 110* 252*   D-Dimer: No results for input(s): DDIMER in the last 72 hours. Hgb A1c: No results for input(s): HGBA1C in the last 72 hours. Lipid Profile: No results for input(s): CHOL, HDL, LDLCALC, TRIG, CHOLHDL, LDLDIRECT in the last 72 hours.  Thyroid  function studies: No results for input(s): TSH, T4TOTAL, T3FREE, THYROIDAB in the last 72 hours.  Invalid input(s): FREET3 Anemia work up: No results for input(s): VITAMINB12, FOLATE, FERRITIN, TIBC, IRON , RETICCTPCT in the last 72 hours. Sepsis Labs: Recent Labs  Lab 10/29/23 0605 10/29/23 0811 10/29/23 1044 10/29/23 1215 10/30/23 0100 10/30/23 0515 10/31/23 0415 11/01/23 0453 11/02/23 0414 11/03/23 0401 11/04/23 0511  PROCALCITON 0.57  --   --   --   --   --   --   --   --   --   --   WBC 23.5*  --   --  19.7* 17.2* 13.8*   < > 7.1 5.2 4.5 5.1  LATICACIDVEN  --    < > 2.6* 2.6* 0.9 0.9  --   --   --   --   --    < > = values in this interval not displayed.    Microbiology Recent Results (from the past 240 hours)  Respiratory  (~20 pathogens) panel by PCR     Status: Abnormal   Collection Time: 10/25/23  4:15 PM   Specimen: Nasopharyngeal Swab; Respiratory  Result Value Ref Range Status   Adenovirus NOT DETECTED NOT DETECTED Final   Coronavirus 229E NOT DETECTED NOT DETECTED Final    Comment: (NOTE) The Coronavirus on the Respiratory Panel, DOES NOT test for the novel  Coronavirus (2019 nCoV)    Coronavirus HKU1 NOT DETECTED NOT DETECTED Final   Coronavirus NL63 NOT DETECTED NOT DETECTED Final   Coronavirus OC43 NOT DETECTED NOT DETECTED Final   Metapneumovirus DETECTED (A) NOT DETECTED Final   Rhinovirus / Enterovirus NOT DETECTED NOT DETECTED Final   Influenza A NOT DETECTED NOT DETECTED Final   Influenza B NOT DETECTED NOT DETECTED Final   Parainfluenza Virus 1 NOT DETECTED NOT DETECTED Final   Parainfluenza Virus 2 NOT DETECTED NOT DETECTED Final   Parainfluenza Virus 3 NOT DETECTED NOT DETECTED Final   Parainfluenza Virus 4 NOT DETECTED NOT DETECTED Final   Respiratory Syncytial Virus NOT DETECTED NOT DETECTED Final   Bordetella pertussis NOT DETECTED NOT DETECTED Final   Bordetella Parapertussis NOT DETECTED NOT DETECTED Final   Chlamydophila pneumoniae NOT DETECTED NOT DETECTED Final   Mycoplasma pneumoniae NOT DETECTED NOT DETECTED Final    Comment: Performed at [facility], [address] [place], [place] [address]  SARS Coronavirus 2 by RT PCR (hospital order, performed in [facility] hospital lab) *cepheid single result test* Anterior Nasal Swab     Status: None   Collection Time: 10/25/23  4:15 PM   Specimen: Anterior Nasal Swab  Result Value Ref Range Status   SARS Coronavirus 2 by RT PCR NEGATIVE NEGATIVE Final    Comment: (NOTE) SARS-CoV-2 target nucleic acids are NOT DETECTED.  The SARS-CoV-2 RNA is generally detectable in upper and lower respiratory specimens during the acute phase of infection. The lowest concentration of SARS-CoV-2 viral copies this assay can detect is  250 copies / mL. A negative result does not preclude SARS-CoV-2 infection and should not be used as the sole basis for treatment or other patient management decisions.  A negative result may occur with improper  specimen collection / handling, submission of specimen other than nasopharyngeal swab, presence of viral mutation(s) within the areas targeted by this assay, and inadequate number of viral copies (<250 copies / mL). A negative result must be combined with clinical observations, patient history, and epidemiological information.  Fact Sheet for Patients:   roadlaptop.co.za  Fact Sheet for Healthcare Providers: http://kim-miller.com/  This test is not yet approved or  cleared by the [place]  FDA and has been authorized for detection and/or diagnosis of SARS-CoV-2 by FDA under an Emergency Use Authorization (EUA).  This EUA will remain in effect (meaning this test can be used) for the duration of the COVID-19 declaration under Section 564(b)(1) of the Act, 21 U.S.C. section 360bbb-3(b)(1), unless the authorization is terminated or revoked sooner.  Performed at [facility], [address] [place], [place] [address]   Culture, blood (Routine X 2) w Reflex to ID Panel     Status: None   Collection Time: 10/28/23  3:23 PM   Specimen: BLOOD  Result Value Ref Range Status   Specimen Description BLOOD LEFT ANTECUBITAL  Final   Special Requests   Final    BOTTLES DRAWN AEROBIC AND ANAEROBIC Blood Culture adequate volume   Culture   Final    NO GROWTH 5 DAYS Performed at [facility], [address] [place], [place] [address]    Report Status 11/02/2023 FINAL  Final  Culture, blood (Routine X 2) w Reflex to ID Panel     Status: None   Collection Time: 10/28/23  3:32 PM   Specimen: BLOOD  Result Value Ref Range Status   Specimen Description BLOOD BLOOD LEFT HAND  Final   Special Requests   Final    BOTTLES  DRAWN AEROBIC AND ANAEROBIC Blood Culture results may not be optimal due to an inadequate volume of blood received in culture bottles   Culture   Final    NO GROWTH 5 DAYS Performed at [facility], [address], [place], [place] [address]    Report Status 11/02/2023 FINAL  Final  MRSA Next Gen by PCR, Nasal     Status: None   Collection Time: 10/28/23  4:58 PM   Specimen: Nasal Mucosa; Nasal Swab  Result Value Ref Range Status   MRSA by PCR Next Gen NOT DETECTED NOT DETECTED Final    Comment: (NOTE) The GeneXpert MRSA Assay (FDA approved for NASAL specimens only), is one component of a comprehensive MRSA colonization surveillance program. It is not intended to diagnose MRSA infection nor to guide or monitor treatment for MRSA infections. Test performance is not FDA approved in patients less than 84 years old. Performed at [facility], [address] [place], [place] [address]   Culture, Respiratory w Gram Stain     Status: None   Collection Time: 10/28/23  6:41 PM   Specimen: Tracheal Aspirate; Respiratory  Result Value Ref Range Status   Specimen Description   Final    TRACHEAL ASPIRATE Performed at [facility], [address] [place], [place] [address]    Special Requests   Final    NONE Performed at [facility], [address] [place], [place] [address]    Gram Stain   Final    RARE WBC PRESENT, PREDOMINANTLY PMN NO ORGANISMS SEEN    Culture   Final    RARE Normal respiratory flora-no Staph aureus or Pseudomonas seen Performed at [facility], [address] [place], [place]  [address]    Report Status 10/31/2023 FINAL  Final    Procedures and diagnostic studies:  No results found.              LOS: 33 days   [place] Pulse  [employer] Adjuster on [email or web address]. If 7PM-7AM, please contact night-coverage at [email or web address]     11/04/2023, 3:26 PM

## 2023-11-04 NOTE — Plan of Care (Signed)
 Problem: Education: Goal: Ability to describe self-care measures that may prevent or decrease complications (Diabetes Survival Skills Education) will improve Outcome: Progressing Goal: Individualized Educational Video(s) Outcome: Progressing   Problem: Coping: Goal: Ability to adjust to condition or change in health will improve Outcome: Progressing   Problem: Fluid Volume: Goal: Ability to maintain a balanced intake and output will improve Outcome: Progressing   Problem: Health Behavior/Discharge Planning: Goal: Ability to identify and utilize available resources and services will improve Outcome: Progressing Goal: Ability to manage health-related needs will improve Outcome: Progressing   Problem: Metabolic: Goal: Ability to maintain appropriate glucose levels will improve Outcome: Progressing   Problem: Nutritional: Goal: Maintenance of adequate nutrition will improve Outcome: Progressing Goal: Progress toward achieving an optimal weight will improve Outcome: Progressing   Problem: Skin Integrity: Goal: Risk for impaired skin integrity will decrease Outcome: Progressing   Problem: Tissue Perfusion: Goal: Adequacy of tissue perfusion will improve Outcome: Progressing   Problem: Education: Goal: Knowledge of General Education information will improve Description: Including pain rating scale, medication(s)/side effects and non-pharmacologic comfort measures Outcome: Progressing   Problem: Health Behavior/Discharge Planning: Goal: Ability to manage health-related needs will improve Outcome: Progressing   Problem: Clinical Measurements: Goal: Ability to maintain clinical measurements within normal limits will improve Outcome: Progressing Goal: Will remain free from infection Outcome: Progressing Goal: Diagnostic test results will improve Outcome: Progressing Goal: Respiratory complications will improve Outcome: Progressing Goal: Cardiovascular complication will  be avoided Outcome: Progressing   Problem: Activity: Goal: Risk for activity intolerance will decrease Outcome: Progressing   Problem: Nutrition: Goal: Adequate nutrition will be maintained Outcome: Progressing   Problem: Coping: Goal: Level of anxiety will decrease Outcome: Progressing   Problem: Elimination: Goal: Will not experience complications related to bowel motility Outcome: Progressing Goal: Will not experience complications related to urinary retention Outcome: Progressing   Problem: Pain Management: Goal: General experience of comfort will improve Outcome: Progressing   Problem: Safety: Goal: Ability to remain free from injury will improve Outcome: Progressing   Problem: Skin Integrity: Goal: Risk for impaired skin integrity will decrease Outcome: Progressing   Problem: Education: Goal: Knowledge of disease or condition will improve Outcome: Progressing Goal: Knowledge of the prescribed therapeutic regimen will improve Outcome: Progressing Goal: Individualized Educational Video(s) Outcome: Progressing   Problem: Activity: Goal: Ability to tolerate increased activity will improve Outcome: Progressing Goal: Will verbalize the importance of balancing activity with adequate rest periods Outcome: Progressing   Problem: Respiratory: Goal: Ability to maintain a clear airway will improve Outcome: Progressing Goal: Levels of oxygenation will improve Outcome: Progressing Goal: Ability to maintain adequate ventilation will improve Outcome: Progressing   Problem: Activity: Goal: Ability to tolerate increased activity will improve Outcome: Progressing   Problem: Clinical Measurements: Goal: Ability to maintain a body temperature in the normal range will improve Outcome: Progressing   Problem: Respiratory: Goal: Ability to maintain adequate ventilation will improve Outcome: Progressing Goal: Ability to maintain a clear airway will improve Outcome:  Progressing   Problem: Education: Goal: Knowledge of disease or condition will improve Outcome: Progressing Goal: Knowledge of secondary prevention will improve (MUST DOCUMENT ALL) Outcome: Progressing Goal: Knowledge of patient specific risk factors will improve Loraine Leriche N/A or DELETE if not current risk factor) Outcome: Progressing   Problem: Ischemic Stroke/TIA Tissue Perfusion: Goal: Complications of ischemic stroke/TIA will be minimized Outcome: Progressing   Problem: Coping: Goal: Will verbalize positive feelings about self Outcome: Progressing Goal: Will identify appropriate support needs Outcome: Progressing   Problem: Health  Behavior/Discharge Planning: Goal: Ability to manage health-related needs will improve Outcome: Progressing Goal: Goals will be collaboratively established with patient/family Outcome: Progressing   Problem: Self-Care: Goal: Ability to participate in self-care as condition permits will improve Outcome: Progressing Goal: Verbalization of feelings and concerns over difficulty with self-care will improve Outcome: Progressing Goal: Ability to communicate needs accurately will improve Outcome: Progressing

## 2023-11-04 NOTE — Progress Notes (Signed)
 Occupational Therapy Treatment Patient Details Name: Michele House MRN: [id number] DOB: August 25, 1956 Today's Date: 11/04/2023   History of present illness Patient is a 68 year old with prolonged hospital stay, originally admitted with AMS. 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 She developed respiratory distress requiring mechanical ventilation. Self extubated 12/27 with re-intubation and extubation on 12/30.   OT comments  Michele House was seen for OT/PT treatment on this date. Upon arrival to room pt in bed, agreeable to tx. Pt requires TOTAL A x2 for all bed mobility, improved sitting tolerance with attempts made to correct posture when cued. MAX A hand over hand for self-feeding at bed level, pt maintains grip on bacon however assist to release grip and for hand to mouth. R grip 3/5 and 2-/5 shoulder elevation, no other active movement noted in RUE. Continues to demonstrate thumb width subluxation and R inattention. Pt making progress toward goals, will continue to follow POC. Discharge recommendation remains appropriate.       If plan is discharge home, recommend the following:  Assistance with cooking/housework;Assist for transportation;Help with stairs or ramp for entrance;Direct supervision/assist for medications management;Supervision due to cognitive status;Direct supervision/assist for financial management;Two people to help with walking and/or transfers;Two people to help with bathing/dressing/bathroom   Equipment Recommendations  Other (comment) (defer)    Recommendations for Other Services      Precautions / Restrictions Precautions Precautions: Fall Precaution Comments: right side hemiparesis Restrictions Weight Bearing Restrictions Per Provider Order: No       Mobility Bed Mobility Overal bed mobility: Needs Assistance Bed Mobility: Supine to Sit, Sit to Supine     Supine to sit: Total assist, +2 for physical assistance Sit to  supine: Total assist, +2 for physical assistance        Transfers                   General transfer comment: unsafe to attempt due to poor sitting balance and significant global weakness     Balance Overall balance assessment: Needs assistance Sitting-balance support: Feet supported Sitting balance-Leahy Scale: Poor Sitting balance - Comments: with cues attempts to correct posture                                   ADL either performed or assessed with clinical judgement   ADL Overall ADL's : Needs assistance/impaired                                       General ADL Comments: MAX A hand over hand for self-feeding at bed level, pt maintains grip on bacon however assist to release grip and for hand to mouth.     Extremity/Trunk Assessment Upper Extremity Assessment Upper Extremity Assessment: RUE deficits/detail RUE Deficits / Details: 3/5 grip and 2-/5 shoulder elevation, no other active movement noted             Cognition Arousal: Alert Behavior During Therapy: Flat affect Overall Cognitive Status: Within Functional Limits for tasks assessed  Pertinent Vitals/ Pain       Pain Assessment Pain Assessment: No/denies pain   Frequency  Min 1X/week        Progress Toward Goals  OT Goals(current goals can now be found in the care plan section)  Progress towards OT goals: Progressing toward goals  ADL Goals Pt Will Perform Lower Body Dressing: with mod assist;sitting/lateral leans Pt Will Transfer to Toilet: with min assist Pt Will Perform Toileting - Clothing Manipulation and hygiene: with mod assist;sitting/lateral leans Additional ADL Goal #1: Pt will demonstrate good safety awareness during ADL/mobility tasks requiring no VC for safety, 3/3 opportunities.  Plan      Co-evaluation    PT/OT/SLP Co-Evaluation/Treatment: Yes Reason for  Co-Treatment: Complexity of the patient's impairments (multi-system involvement);Necessary to address cognition/behavior during functional activity;For patient/therapist safety;To address functional/ADL transfers PT goals addressed during session: Mobility/safety with mobility OT goals addressed during session: ADL's and self-care      AM-PAC OT 6 Clicks Daily Activity     Outcome Measure   Help from another person eating meals?: A Lot Help from another person taking care of personal grooming?: A Lot Help from another person toileting, which includes using toliet, bedpan, or urinal?: Total Help from another person bathing (including washing, rinsing, drying)?: Total Help from another person to put on and taking off regular upper body clothing?: Total Help from another person to put on and taking off regular lower body clothing?: Total 6 Click Score: 8    End of Session    OT Visit Diagnosis: Other abnormalities of gait and mobility (R26.89);Muscle weakness (generalized) (M62.81);Hemiplegia and hemiparesis Hemiplegia - Right/Left: Right Hemiplegia - dominant/non-dominant: Dominant Hemiplegia - caused by: Cerebral infarction   Activity Tolerance Patient tolerated treatment well   Patient Left in bed;with call bell/phone within reach   Nurse Communication Mobility status        Time: 9077-9051 OT Time Calculation (min): 26 min  Charges: OT General Charges $OT Visit: 1 Visit OT Treatments $Self Care/Home Management : 8-22 mins  Michele House, M.S. OTR/L  11/04/23, 10:48 AM  ascom [phone]

## 2023-11-04 NOTE — Inpatient Diabetes Management (Signed)
 Inpatient Diabetes Program Recommendations  AACE/ADA: New Consensus Statement on Inpatient Glycemic Control (2015)  Target Ranges:  Prepandial:   less than 140 mg/dL      Peak postprandial:   less than 180 mg/dL (1-2 hours)      Critically ill patients:  140 - 180 mg/dL   Lab Results  Component Value Date   GLUCAP 110 (H) 11/04/2023   HGBA1C 8.4 (H) 10/15/2023    Review of Glycemic Control  Latest Reference Range & Units 11/03/23 11:47 11/03/23 14:19 11/03/23 16:19 11/03/23 19:34 11/03/23 23:27 11/04/23 04:05 11/04/23 07:18  Glucose-Capillary 70 - 99 mg/dL 772 (H) 763 (H) 695 (H) 215 (H) 82 94 110 (H)   Diabetes history: DM  Outpatient Diabetes medications:  Glipizide  10 mg with breakfast Tradjenta  5 mg daily Current orders for Inpatient glycemic control:  Novolog  0-9 units q 4 hours Semglee  10 units daily Prednisone  taper-  Inpatient Diabetes Program Recommendations:    - Consider reducing Novolog  correction frequency to tid with meals and HS.  Consider adding Novolog  3 units tid with meals (hold if patient eats less than 50% or NPO) while on steroids.   Thanks,  Randall Bullocks, RN, BC-ADM Inpatient Diabetes Coordinator Pager [phone]  (8a-5p)

## 2023-11-04 NOTE — Plan of Care (Signed)

## 2023-11-04 NOTE — Progress Notes (Signed)
 Pt has order to transfer to [facility]. Report given to [facility]. All belongings sent with pt. Pt transported by this RN. Rolling walker sent with pt. Son Seferina Brokaw notified of transfer.

## 2023-11-04 NOTE — Evaluation (Signed)
 Clinical/Bedside Swallow Evaluation Patient Details  Name: Michele House MRN: [id number] Date of Birth: Aug 01, 1956  Today's Date: 11/04/2023 Time: SLP Start Time (ACUTE ONLY): 0825 SLP Stop Time (ACUTE ONLY): 0842 SLP Time Calculation (min) (ACUTE ONLY): 17 min  Past Medical History:  Past Medical History:  Diagnosis Date   Acid reflux    COPD (chronic obstructive pulmonary disease) (HCC)    Diabetes mellitus without complication (HCC)    Hyperlipidemia    Hypertension    Thyroid  disease    Past Surgical History:  Past Surgical History:  Procedure Laterality Date   appendectomy     APPENDECTOMY     BREAST BIOPSY Right    CORE W/CLIP - NEG   ESOPHAGOGASTRODUODENOSCOPY N/A 10/28/2023   Procedure: ESOPHAGOGASTRODUODENOSCOPY (EGD);  Surgeon: Jinny Carmine, MD;  Location: [facility] ENDOSCOPY;  Service: Endoscopy;  Laterality: N/A;   IR EMBO ART  VEN HEMORR LYMPH EXTRAV  INC GUIDE ROADMAPPING  10/30/2023   TOTAL VAGINAL HYSTERECTOMY     TUMOR REMOVAL     benign;stomach   VIDEO BRONCHOSCOPY WITH ENDOBRONCHIAL NAVIGATION Right 10/20/2023   Procedure: VIDEO BRONCHOSCOPY WITH ENDOBRONCHIAL NAVIGATION;  Surgeon: Parris Manna, MD;  Location: [facility] ORS;  Service: Thoracic;  Laterality: Right;   HPI:  Pt is a 68 y/o F admitted on 10/02/23 after presenting with acute on chronic respiratory failure with hypoxia, COPD exacerbation, PNA, UTI, & encephalopathy. PMH: COPD, chronic respiratory failure on 2L,HTN, DM2, HFpEF. Head CT/CTA, 12/11, CT HEAD:     1. 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 No  associated hemorrhage evident by CT. No significant regional mass  effect.  2. No other new acute intracranial abnormality.  3. 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 No hydrocephalus.  4. Age-related cerebral atrophy with mild chronic microvascular   ischemic disease.     CTA HEAD AND NECK:     1. Negative CTA for large vessel occlusion or other emergent  finding.  2. 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  3. 1.7 cm spiculated right upper lobe pulmonary nodule. Again,  further evaluation with dedicated PET-CT recommended. Developed respiratory distress on 12/23--found to have metapneumovirus, stabilized but unfortunately developed worsening respiratory distress and required mechanical ventilation  S/p bronchoscopy 12/27 biopsy of lung nodule performed-very rare atypical cells and mostly reactive alveolar cells     Course further complicated by acute anemia and development of shock  S/p EGD oozing duodenal ulcer-clipped 12/26  S/p IR embolization to duodenal artery 12/28      Self extubated 12/27 soon after required reintubation    Assessment / Plan / Recommendation  Clinical Impression  Pt seen for clinical swallowing evaluation. Pt known to SLP services from earlier this admission (see note for details). Since last SLP visit, pt teveloped respiratory distress on 12/23--found to have metapneumovirus, stabilized but unfortunately developed worsening respiratory distress and required mechanical ventilation. Pt  s/p bronchoscopy 12/27 biopsy of lung nodule performed-very rare atypical cells and mostly reactive alveolar cells. Pt's medical course further complicated by acute anemia and development of shock. Pt EGD oozing duodenal ulcer-clipped 12/26 and IR embolization to duodenal artery 12/28. Pt self extubated 12/27 soon after required reintubation, extubated 12/30. Pt demonstrated a grossly functional oral swallow c/b mildly prolonged, but functional mastication of solids which seems c/w previous evaluation and likely due to dental status. Recommend  diet upgrade to regular diet with thin liquids with safe swallowing strategie/aspiratoin precautions as outlined below. SLP to sign off as pt has no acute SLP  needs. Recommend cognitive-linguistic re-evaluation at next level of care as per previous SLP recommendations. SLP Visit Diagnosis: Dysphagia, oral phase (R13.11)    Aspiration Risk  Mild aspiration risk    Diet Recommendation Regular;Thin liquid    Liquid Administration via: Spoon;Cup;Straw Medication Administration:  (as tolerated) Supervision: Staff to assist with self feeding;Full supervision/cueing for compensatory strategies Compensations: Minimize environmental distractions;Slow rate;Small sips/bites Postural Changes: Seated upright at 90 degrees    Other  Recommendations Oral Care Recommendations: Oral care BID;Staff/trained caregiver to provide oral care    Recommendations for follow up therapy are one component of a multi-disciplinary discharge planning process, led by the attending physician.  Recommendations may be updated based on patient status, additional functional criteria and insurance authorization.  Follow up Recommendations Skilled nursing-short term rehab (<3 hours/day)         Functional Status Assessment Patient has not had a recent decline in their functional status    Swallow Study   General HPI: Pt is a 68 y/o F admitted on 10/02/23 after presenting with acute on chronic respiratory failure with hypoxia, COPD exacerbation, PNA, UTI, & encephalopathy. PMH: COPD, chronic respiratory failure on 2L,HTN, DM2, HFpEF. Head CT/CTA, 12/11, CT HEAD:     1. 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 No  associated hemorrhage evident by CT. No significant regional mass  effect.  2. No other new acute intracranial abnormality.  3. 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 No hydrocephalus.  4. Age-related cerebral atrophy with mild chronic microvascular  ischemic disease.     CTA HEAD AND NECK:     1. Negative CTA for large  vessel occlusion or other emergent  finding.  2. 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  3. 1.7 cm spiculated right upper lobe pulmonary nodule. Again,  further evaluation with dedicated PET-CT recommended. Developed respiratory distress on 12/23--found to have metapneumovirus, stabilized but unfortunately developed worsening respiratory distress and required mechanical ventilation  S/p bronchoscopy 12/27 biopsy of lung nodule performed-very rare atypical cells and mostly reactive alveolar cells     Course further complicated by acute anemia and development of shock  S/p EGD oozing duodenal ulcer-clipped 12/26  S/p IR embolization to duodenal artery 12/28      Self extubated 12/27 soon after required reintubation Type of Study: Bedside Swallow Evaluation Previous Swallow Assessment: earlier this admission; see notes Diet Prior to this Study: Dysphagia 3 (mechanical soft);Thin liquids (Level 0) Temperature Spikes Noted: No Respiratory Status: Nasal cannula (2L/min) History of Recent Intubation: Yes Total duration of intubation (days): 5 days (with brief period of self-extubation on 12/27) Date extubated: 10/31/24 Behavior/Cognition: Alert;Cooperative;Pleasant mood Oral Cavity Assessment: Within Functional Limits Oral Care Completed by SLP: Yes Oral Cavity - Dentition: Edentulous;Poor condition;Missing dentition;Dentures, not available (edentulous upper arch)    Oral/Motor/Sensory Function WFL  Ice Chips Ice chips: Not tested   Thin Liquid Thin Liquid: Within functional limits Presentation: Cup;Straw;Self Fed    Nectar Thick Nectar Thick Liquid: Not tested   Honey Thick Honey Thick Liquid: Not tested   Puree Puree: Within functional limits Presentation: Self Fed   Solid     Solid: Within functional limits Oral Phase Impairments: Impaired mastication Oral  Phase Functional Implications: Impaired mastication Pharyngeal Phase Impairments:   (WFL) Other Comments: mildly prolonged, but functional, mastication of solids     Delon Bangs, M.S., CCC-SLP Speech-Language Pathologist [place] [facility] [phone] (ASCOM)  Delon HERO Markia Kyer 11/04/2023,10:11 AM

## 2023-11-04 NOTE — Progress Notes (Signed)
 Physical Therapy Treatment Patient Details Name: Michele House MRN: [id number] DOB: [birth year] Today's Date: 11/04/2023   History of Present Illness Patient is a 68 year old with prolonged hospital stay, originally admitted with AMS. 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 She developed respiratory distress requiring mechanical ventilation. Self extubated 12/27 with re-intubation and extubation on 12/30.    PT Comments  Patient seen for PT/OT treatment this date. Patient continues to require totalA+2 for bed mobility and totalA to maintain sitting balance despite noticeable improvement in sitting tolerance and attempt made to correct posture with cueing. Noted R quad set 2-/5 and able to initiate hip adduction in supine. Continues to be limited by significant global weakness. Encouraged patient to perform shoulder shrugs and keeping head up during the day for strengthening. Discharge plan remains appropriate.     If plan is discharge home, recommend the following: Two people to help with walking and/or transfers;Two people to help with bathing/dressing/bathroom;Assistance with cooking/housework;Direct supervision/assist for medications management;Assistance with feeding;Direct supervision/assist for financial management;Assist for transportation;Help with stairs or ramp for entrance;Supervision due to cognitive status   Can travel by private vehicle     No  Equipment Recommendations  Other (comment) (TBD at next venue of care)    Recommendations for Other Services       Precautions / Restrictions Precautions Precautions: Fall Precaution Comments: right side hemiparesis Restrictions Weight Bearing Restrictions Per Provider Order: No     Mobility  Bed Mobility Overal bed mobility: Needs Assistance Bed Mobility: Supine to Sit, Sit to Supine     Supine to sit: Total assist, +2 for physical assistance Sit to supine: Total assist, +2 for physical  assistance        Transfers                   General transfer comment: unsafe to attempt due to poor sitting balance and significant global weakness    Ambulation/Gait                   Stairs             Wheelchair Mobility     Tilt Bed    Modified Rankin (Stroke Patients Only)       Balance Overall balance assessment: Needs assistance Sitting-balance support: Feet supported Sitting balance-Leahy Scale: Poor Sitting balance - Comments: with cues attempts to correct posture                                    Cognition Arousal: Alert Behavior During Therapy: Flat affect Overall Cognitive Status: Within Functional Limits for tasks assessed                                          Exercises Other Exercises Other Exercises: shoulder shrugs x 10    General Comments        Pertinent Vitals/Pain Pain Assessment Pain Assessment: No/denies pain    Home Living                          Prior Function            PT Goals (current goals can now be found in the care plan section) Acute Rehab PT Goals PT  Goal Formulation: With patient Time For Goal Achievement: 11/16/23 Potential to Achieve Goals: Fair Progress towards PT goals: Progressing toward goals    Frequency    Min 1X/week      PT Plan      Co-evaluation PT/OT/SLP Co-Evaluation/Treatment: Yes Reason for Co-Treatment: Complexity of the patient's impairments (multi-system involvement);Necessary to address cognition/behavior during functional activity;For patient/therapist safety;To address functional/ADL transfers PT goals addressed during session: Mobility/safety with mobility OT goals addressed during session: ADL's and self-care      AM-PAC PT 6 Clicks Mobility   Outcome Measure  Help needed turning from your back to your side while in a flat bed without using bedrails?: Total Help needed moving from lying on your back  to sitting on the side of a flat bed without using bedrails?: Total Help needed moving to and from a bed to a chair (including a wheelchair)?: Total Help needed standing up from a chair using your arms (e.g., wheelchair or bedside chair)?: Total Help needed to walk in hospital room?: Total Help needed climbing 3-5 steps with a railing? : Total 6 Click Score: 6    End of Session         PT Visit Diagnosis: Unsteadiness on feet (R26.81);Other abnormalities of gait and mobility (R26.89);Muscle weakness (generalized) (M62.81);Difficulty in walking, not elsewhere classified (R26.2);Hemiplegia and hemiparesis Hemiplegia - Right/Left: Right Hemiplegia - dominant/non-dominant: Dominant Hemiplegia - caused by: Cerebral infarction     Time: 0922-0947 PT Time Calculation (min) (ACUTE ONLY): 25 min  Charges:    $Therapeutic Activity: 8-22 mins PT General Charges $$ ACUTE PT VISIT: 1 Visit                     Maryanne Finder, PT, DPT Physical Therapist - [facility]  [facility]    Kayhan Boardley A Riya Huxford 11/04/2023, 12:20 PM

## 2023-11-04 NOTE — Plan of Care (Signed)
°  Problem: Education: Goal: Ability to describe self-care measures that may prevent or decrease complications (Diabetes Survival Skills Education) will improve Outcome: Progressing   Problem: Coping: Goal: Ability to adjust to condition or change in health will improve Outcome: Progressing   Problem: Nutritional: Goal: Maintenance of adequate nutrition will improve Outcome: Progressing   Problem: Clinical Measurements: Goal: Ability to maintain clinical measurements within normal limits will improve Outcome: Progressing

## 2023-11-04 NOTE — Progress Notes (Signed)
 NAME:  Michele House, MRN:  [id number], DOB:  [birth year], LOS: 33 ADMISSION DATE:  10/02/2023   History of Present Illness:  Case of a 68 year old female patient with a past medical history of COPD with chronic respiratory failure on 2 L nasal cannula, hypertension, type 2 diabetes, heart failure with preserved EF who has been here for a month.  Initially presented with altered mental status encephalopathy.  Had an extensive workup including brain MRI which was negative for any acute intracranial abnormality initially.  However unfortunately course complicated by acute CVA with repeat MRI showing multiple acute infarcts in the high bilateral posterior frontal and parietal lobes.  She recovered from that but course further complicated by waxing and waning mental status which prompted an LP to rule out encephalitis and has been unremarkable so far.  She was found to have right upper lobe lung nodule which was concerning for malignancy and underwent robotic assisted navigational bronchoscopy with biopsy on 12/27 which showed very rare atypical cells and reactive alveolar cells mostly.    Pertinent  Medical History  As abnove  Significant Hospital Events: Including procedures, antibiotic start and stop dates in addition to other pertinent events   12/01: Vital stable, respiratory viral panel negative, procalcitonin negative, preliminary blood cultures negative.  Urine cultures are ordered as add-on, ammonia levels mildly elevated at 39, strep pneumo negative, CBG elevated at 244, history of enlarging pulmonary nodule with recommendations for PET/CT during most recent admission which has not been done yet, if video bronchoscopy was scheduled for 10/13/2023 as outpatient. 12/02: Unable to take care of herself and son cannot provide the required care as she required 24-hour supervision due to worsening dementia.  PT is recommending SNF. 12/03: Blood pressure started trending up, restarting home  Zestoretic .  Urine cultures with strep agalactiae, penicillin allergy noted, will continue with ceftriaxone  for now.  Also started on Remeron  for concern of worsening depression and unable to sleep at night  12/05:  Patient has some blurry vision and weakness and pain.  MRI of the brain negative for acute intracranial abnormality. 12/08:  Patient more confused today than yesterday.  Able to follow some simple commands but not as good of a conversation today as yesterday. 12/09:  Patient still confused today.  Does not follow simple commands today. 12/10: Patient more talkative today but was looking up at the ceiling when she was talking with me.  She does not move her extremities to command but does move them on her own.  Physical therapy and Occupational Therapy did not see her move her right arm. 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  Mild associated petechial hemorrhage.  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 12/12: Patient with new stroke, CTA head and neck with no large vessel occlusion.  Patient had a recent echocardiogram which was normal. Concern of paraneoplastic versus hypercoagulability secondary to malignancy.  Patient missed her appointment for bronchoscopy and outpatient appointment for PET scan due to recurrent hospitalizations. 12/13: Patient continued to have waxing and waning mental status, unable to move right upper and lower extremities.  Having some hallucination.  Pulmonary is planning lung biopsy on 12/27 if she remains stable.  Rapidly progressive dementia and now with underlying stroke, question of paraneoplastic encephalitis and hypercoagulability secondary to underlying undiagnosed malignancy. 12/15: Patient with much  improved mentation.  Having some flickering of movements on right  upper and lower extremity today, left extremities weaker but seems improving.   12/18: Pt underwent bronchoscopy and lung biopsy 12/19: patient stable , she is being optimized for dc for rehab.  She's cleared from pulmonary for discharge. Results from bronch will take 1 week.   12/26: Pt transferred to the stepdown unit with acute hypercapnic respiratory requiring Bipap but subsequently required mechanical intubation due to worsening respiratory failure.  Insulin  gtt initiated due to severe hyperglycemia.  With hemorrhagic shock due to bleeding duodenal ulcer, underwent emergent EGD with successful hemostasis by injection of Epinephrine  and hemostatic clip. 12/27: Hgb slowly trending down, no report of bleeding from OG suction nor melena. Weaning down vasopressors, if able to continue to wean vasopressors, then can consider WUA/SBT.  Giving temporizing measures for Hyperkalemia. Self extubated but required reintubation. Hgb continued to trend down, given 1 unit pRBC's and 2 FFP.  IR consulted for consideration for embolization. 12/28: Weaned off Vasopressin , Levophed  weaning down.  S/p IR embolization of the Duodenal artery. Peak pressures and lung sounds improved with Ketamine , will keep intubated today given rapid reintubation yesterday. 12/30- patient on PRVC FiO2 28%, weaning off ketamine  and hoping to perform SBT today.  Holding feeds until post SBT.  11/02/23- patient is stable overnight no acute events, electrllytes improved.  No GI bleeds noted.  Opitimizing for TRH today.  11/03/23 patient is more interactive this am, still has not been able to get OOB.  Will continue to follow her from pulmonary with COPD and lung cancer. Her pathology was sent for second opinion due to mixed reporting from cytotech and pathology report.  11/03/22- patient is good spirits this am. Son came to visit yesterday and we reivewed medical plan. H/h is stable. Following peripherally from pulmonary perspective for COPD and  lung mass.   Interim History / Subjective:  Remains Intubated with significant wheezing and chest tightness.   Objective   Blood pressure 119/71, pulse 89, temperature 98.1 F (36.7 C), temperature source Oral, resp. rate 13, height 5' (1.524 m), weight 66.3 kg, last menstrual period 12/24/1998, SpO2 98%.        Intake/Output Summary (Last 24 hours) at 11/04/2023 0836 Last data filed at 11/03/2023 2200 Gross per 24 hour  Intake 679.42 ml  Output 350 ml  Net 329.42 ml   Filed Weights   10/28/23 1431 11/02/23 0438 11/03/23 0411  Weight: 66.5 kg 68.1 kg 66.3 kg    Examination: General: Awake, follows simple commands  HENT: Supple neck reactive pupils  Lungs: mild wheezing b/l Cardiovascular: Normal S1, Normal S2, RRR  Abdomen: Soft, non tender, non distended, +BS  Extremities: Warm and well perfused no edema.   Labs and imaging were reviewed.   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  This has been sent for second opinion to be reviewed.  I think its most likely primary lung cancer.    Best Practice (right click and Reselect all SmartList Selections daily)  Diet/type: NPO DVT prophylaxis SCD Pressure ulcer(s): N/A GI prophylaxis: PPI Lines: Central line Foley:  Yes, and it is still needed Code Status:  full code     Lashon Hillier, M.D.  Pulmonary & Critical Care Medicine

## 2023-11-05 DIAGNOSIS — R911 Solitary pulmonary nodule: Secondary | ICD-10-CM | POA: Diagnosis not present

## 2023-11-05 DIAGNOSIS — I639 Cerebral infarction, unspecified: Secondary | ICD-10-CM | POA: Diagnosis not present

## 2023-11-05 DIAGNOSIS — G9341 Metabolic encephalopathy: Secondary | ICD-10-CM | POA: Diagnosis not present

## 2023-11-05 DIAGNOSIS — Z515 Encounter for palliative care: Secondary | ICD-10-CM | POA: Diagnosis not present

## 2023-11-05 DIAGNOSIS — J441 Chronic obstructive pulmonary disease with (acute) exacerbation: Secondary | ICD-10-CM | POA: Diagnosis not present

## 2023-11-05 LAB — BASIC METABOLIC PANEL
Anion gap: 12 (ref 5–15)
BUN: 13 mg/dL (ref 8–23)
CO2: 34 mmol/L — ABNORMAL HIGH (ref 22–32)
Calcium: 8.9 mg/dL (ref 8.9–10.3)
Chloride: 93 mmol/L — ABNORMAL LOW (ref 98–111)
Creatinine, Ser: 0.6 mg/dL (ref 0.44–1.00)
GFR, Estimated: >60 mL/min (ref >60)
Glucose, Bld: 195 mg/dL — ABNORMAL HIGH (ref 70–99)
Potassium: 3.6 mmol/L (ref 3.5–5.1)
Sodium: 139 mmol/L (ref 135–145)

## 2023-11-05 LAB — GLUCOSE, CAPILLARY
Glucose-Capillary: 214 mg/dL — ABNORMAL HIGH (ref 70–99)
Glucose-Capillary: 288 mg/dL — ABNORMAL HIGH (ref 70–99)
Glucose-Capillary: 293 mg/dL — ABNORMAL HIGH (ref 70–99)
Glucose-Capillary: 165 mg/dL — ABNORMAL HIGH (ref 70–99)
Glucose-Capillary: 183 mg/dL — ABNORMAL HIGH (ref 70–99)

## 2023-11-05 LAB — CBC
HCT: 32.1 % — ABNORMAL LOW (ref 36.0–46.0)
Hemoglobin: 10.6 g/dL — ABNORMAL LOW (ref 12.0–15.0)
MCH: 30.1 pg (ref 26.0–34.0)
MCHC: 33 g/dL (ref 30.0–36.0)
MCV: 91.2 fL (ref 80.0–100.0)
Platelets: 268 10*3/uL (ref 150–400)
RBC: 3.52 MIL/uL — ABNORMAL LOW (ref 3.87–5.11)
RDW: 18.4 % — ABNORMAL HIGH (ref 11.5–15.5)
WBC: 7.5 10*3/uL (ref 4.0–10.5)
nRBC: 0 % (ref 0.0–0.2)

## 2023-11-05 LAB — MAGNESIUM: Magnesium: 2.3 mg/dL (ref 1.7–2.4)

## 2023-11-05 LAB — PHOSPHORUS: Phosphorus: 2.1 mg/dL — ABNORMAL LOW (ref 2.5–4.6)

## 2023-11-05 MED ORDER — K PHOS MONO-SOD PHOS DI & MONO 155-852-130 MG PO TABS
500.0000 mg | ORAL_TABLET | Freq: Two times a day (BID) | ORAL | Status: AC
  Administered 2023-11-05 (×2): 500 mg via ORAL
  Filled 2023-11-05 (×2): qty 2

## 2023-11-05 NOTE — Progress Notes (Signed)
 Daily Progress Note   Patient Name: Michele House       Date: 11/05/2023 DOB: 12-23-1955  Age: 68 y.o. MRN#: [id number] Attending Physician: Jens Durand, MD Primary Care Physician: [facility] Admit Date: 10/02/2023  Reason for Consultation/Follow-up: Establishing goals of care  HPI/Brief Hospital Review: 68 y.o. female  with past medical history of COPD with chronic respiratory failure on 2L [place] at baseline, T2DM, HFpEF and hypertension admitted on 10/02/2023 with AMS.   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   Imaging also revealed right upper lode lung nodule   Developed respiratory distress on 12/23--found to have metapneumovirus, stabilized but unfortunately developed worsening respiratory distress and required mechanical ventilation S/p bronchoscopy 12/27 biopsy of lung nodule performed-very rare atypical cells and mostly reactive alveolar cells, concern for likely primary lung cancer   Course further complicated by acute anemia and development of shock S/p EGD oozing duodenal ulcer-clipped 12/26 S/p IR embolization to duodenal artery 12/28    Self extubated 12/27 soon after required reintubation  Successfully extubated 12/30, transferred out of ICU 1/2   Palliative medicine was consulted for assisting with goals of care conversations.  Subjective: Extensive chart review has been completed prior to meeting patient including labs, vital signs, imaging, progress notes, orders, and available advanced directive documents from current and previous encounters.    Visited with Michele House at her  bedside. She is awake, alert, able to answer most orientation questions appropriately. She is unclear on most recent events leading to intubation. She is unable to recall days of week backwards. She reports feeling well today without acute complaints. She is eager to return home. No family at bedside during time of visit.  We discussed the purpose of a MOST form. Michele House shares at this time she is not interested in completing a MOST form as she does not feel it is appropriate.  Called and spoke with daughter-Michele House, provided medical updates and updates via chart review regarding discharge planning.  Received a return call from [place] regarding specific nursing complaints that had been relayed to her via her brother. Complaints passed along to chiropodist of unit. Michele House-son in hall and assured him as  well complaints had been passed along.  PMT will continue to follow for ongoing needs and support.  Objective:  Physical Exam Constitutional:      General: She is not in acute distress.    Appearance: She is ill-appearing.  Pulmonary:     Effort: Pulmonary effort is normal. No respiratory distress.  Abdominal:     General: There is no distension.     Palpations: Abdomen is soft.  Skin:    General: Skin is warm and dry.  Neurological:     Mental Status: She is alert.     Motor: Weakness present.     Comments: Oriented to person, place and time, confusion surrounding situation             Vital Signs: BP 138/76 (BP Location: Left Arm)   Pulse 100   Temp 98 F (36.7 C)   Resp 18   Ht 5' (1.524 m)   Wt 65.6 kg   LMP 12/24/1998 Comment: prior to her hysterectomy  SpO2 98%   BMI 28.24 kg/m  SpO2: SpO2: 98 % O2 Device: O2 Device: Nasal Cannula O2 Flow Rate: O2 Flow Rate (L/min): 3 L/min   Palliative Care Assessment & Plan   Assessment/Recommendation/Plan  PMT to continue to follow for ongoing needs and support  Care plan was discussed with nursing staff.  Thank you  for allowing the Palliative Medicine Team to assist in the care of this patient.  Total time:  50 minutes  Time spent includes: Detailed review of medical records (labs, imaging, vital signs), medically appropriate exam (mental status, respiratory, cardiac, skin), discussed with treatment team, counseling and educating patient, family and staff, documenting clinical information, medication management and coordination of care.  Waddell Lesches, DNP, AGNP-C Palliative Medicine   Please contact Palliative Medicine Team phone at [phone] for questions and concerns.

## 2023-11-05 NOTE — Progress Notes (Signed)
 Progress Note    Michele House  FMW:[id number] DOB: 09/07/1956  DOA: 10/02/2023 PCP: [facility]      Brief Narrative:    Medical records reviewed and are as summarized below:  Michele House is a 68 y.o. female with medical history significant of COPD, chronic respiratory failure on 2 L, hypertension, type 2 diabetes, HFpEF, recent discharge from the hospital 24th November 2024 after hospitalization for COPD exacerbation complicated by respiratory failure.  She presented to the hospital again on 10/02/2023 with chest pain, malaise, cough, shortness of breath, abdominal pain and dysuria.  She was admitted to the hospital for COPD exacerbation, acute on chronic hypoxemic respiratory failure, UTI and pneumonia.  She became more confused in the hospital and MRI brain revealed acute stroke.        12/1: Vital stable, respiratory viral panel negative, procalcitonin negative, preliminary blood cultures negative.  Urine cultures are ordered as add-on, ammonia levels mildly elevated at 39, strep pneumo negative, CBG elevated at 244, history of enlarging pulmonary nodule with recommendations for PET/CT during most recent admission which has not been done yet, if video bronchoscopy was scheduled for 10/13/2023 as outpatient.  Unable to take care of himself and son cannot provide the required care as she required 24-hour supervision due to worsening dementia.  PT is recommending SNF.  12/2: Hemodynamically stable, now on baseline oxygen of 2 L.  Needs SNF placement. Urine cultures pending.  12/3: Blood pressure started trending up, restarting home Zestoretic .  Urine cultures with strep agalactiae, penicillin allergy noted, will continue with ceftriaxone  for now.  Also started on Remeron  for concern of worsening depression and unable to sleep at night  12/4.  Some very concerned about patient's mental status not being seen.  Patient receiving antibiotics already  and should have improved by now.  Will hold Lipitor.  Restart low-dose Zoloft .  Discontinue Remeron  and do Seroquel  at night. 12/5.  Patient has some blurry vision and weakness and pain.  MRI of the brain negative for acute intracranial abnormality. 12/6.  Patient stated she slept well.  Patient able to lift up her arms up off the bed today.  Patient thought she heard her son's voice outside the room. 12/7.  Patient sitting up in chair when I saw her.  Was able to feed herself breakfast.  Felt okay.  Did not offer any complaints. 12/8.  Patient more confused today than yesterday.  Able to follow some simple commands but not as good of a conversation today as yesterday. 12/9.  Patient still confused today.  Does not follow simple commands today. 12/10.  Patient more talkative today but was looking up at the ceiling when she was talking with me.  She does not move her extremities to command but does move them on her own.  Physical therapy and Occupational Therapy did not see her move her right arm. 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  Mild associated petechial hemorrhage.  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 12/12: Patient with new stroke, CTA head and neck with no large vessel occlusion.  Patient had a recent echocardiogram which was normal. CSF cultures remain negative.  Lipid panel with increased triglyceride at 347, HDL 29 and LDL of 38.  CBC with mild thrombocytopenia at 130, BMP mostly unremarkable.  Patient with significant right-sided weakness. Concern of paraneoplastic versus hypercoagulability secondary to malignancy.  Patient missed her appointment for bronchoscopy and outpatient appointment for PET scan due to recurrent hospitalizations. Involving pulmonary and oncology for their input. Send  out encephalitis panel still pending.  12/13: Patient continued to have waxing and waning mental status, unable to move right upper and lower extremities.  Having some hallucination.  Pulmonary is planning lung biopsy on 12/27 if she remains stable.  Rapidly progressive dementia and now with underlying stroke, question of paraneoplastic encephalitis and hypercoagulability secondary to underlying undiagnosed malignancy. Patient will also get benefit from neuropsych evaluation as outpatient.  12/14: Patient with some improved mentation today.  Trying to squeeze with right hand but still no movements of right lower extremity.  12/15: Patient with much improved mentation.  Having some flickering of movements on right upper and lower extremity today, left extremities weaker but seems improving.   12/16: Mental status seems stable, flaccid right upper and lower extremities.  Complaining of muscle spasms so Robaxin  was ordered.  Pulmonary is trying to do bronchoscopy and biopsy on Wednesday.  12/18: Pt underwent bronchoscopy and lung biopsy 12/19: patient stable , she is being optimized for dc for rehab.  She's cleared from pulmonary for discharge. Results from bronch will take 1 week.   12/26: Pt transferred to the stepdown unit with acute hypercapnic respiratory requiring Bipap but subsequently required mechanical intubation due to worsening respiratory failure.  Insulin  gtt initiated due to severe hyperglycemia.  With hemorrhagic shock due to bleeding duodenal ulcer, underwent emergent EGD with successful hemostasis by injection of Epinephrine  and hemostatic clip. 12/27: Hgb slowly trending down, no report of bleeding from OG suction nor melena. Weaning down vasopressors, if able to continue to wean vasopressors, then can consider WUA/SBT.  Giving temporizing measures for Hyperkalemia. Self extubated but required reintubation. Hgb continued to trend down, given 1 unit pRBC's and 2 FFP.  IR consulted for  consideration for embolization. 12/28: Weaned off Vasopressin , Levophed  weaning down.  S/p IR embolization of the Duodenal artery. Peak pressures and lung sounds improved with Ketamine , will keep intubated today given rapid reintubation yesterday. 12/30- patient on PRVC FiO2 28%, weaning off ketamine  and hoping to perform SBT today.  Holding feeds until post SBT.  11/02/23- patient is stable overnight no acute events, electrllytes improved.  No GI bleeds noted.  Opitimizing for TRH today.     Assessment/Plan:   Principal Problem:   Acute metabolic encephalopathy Active Problems:   CVA (cerebral vascular accident) (HCC)   Uncontrolled type 2 diabetes mellitus with hypoglycemia, without long-term current use of insulin  (HCC)   Hypotension   Acute on chronic respiratory failure with hypoxia (HCC)   Hypernatremia   COPD exacerbation (HCC)   Lung nodule   Pneumonia   UTI (urinary tract infection)   (HFpEF) heart failure with preserved ejection fraction (HCC)   Essential hypertension   Type 2 diabetes mellitus with obesity (HCC)   Melena   Gastrointestinal hemorrhage with melena   Duodenal ulcer    Body mass index is 28.24 kg/m.   Acute metabolic encephalopathy: Improved. Initially completed 5 days of high-dose several IV Solu-Medrol  on 10/12/2023 Differential diagnosis include autoimmune encephalitis   Acute stroke with right hemiplegia: Off of Plavix  because of recent acute GI bleeding.  Right-sided weakness slowly improving. 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   Acute GI bleeding, bleeding duodenal ulcer: Continue Protonix . S/p EGD on 10/28/2023 with clip placed.  S/p IR embolization on 10/30/2023.     Hemorrhagic shock: Resolved Hypotension: Resolved.  Off of  vasopressors   Acute blood loss anemia: H&H is stable.  S/p transfusion with 2 units of PRBCs on 10/28/2023, 1 unit of PRBCs on 10/29/2023.  S/p transfusion with 2 units of platelet pheresis on 10/29/2023. H&H is stable.   Pneumonia: Initially completed 5 days of IV ceftriaxone  and azithromycin  on 10/07/2023. She was restarted on broad-spectrum antibiotics (vancomycin , ceftriaxone  and Flagyl ) on 10/28/2023 when she decompensated requiring transfer to the ICU. Acute UTI: Urine culture showed strep agalactiae.  Completed antibiotics.   Acute on chronic diastolic CHF: Stable.  S/p treatment with IV Lasix .  Continue oral Lasix  Anxiety/panic attacks: Ativan  as needed.  Continue sertraline    COPD exacerbation: This had previously resolved.  However, she had recurrent COPD exacerbation and was restarted on steroids. Continue prednisone  taper.   Acute on chronic hypoxemic respiratory failure, chronic hypercapnic respiratory failure: She was intubated on 10/28/2023 and extubated on 11/01/2023. She is tolerating 2 L/min oxygen via nasal cannula. Use BiPAP nightly.  She has been encouraged to use BiPAP at night.   Type II DM with severe hyperglycemia: Continue Semglee  and NovoLog .  Monitor glucose levels and adjust insulin  as needed. S/p treatment with IV insulin  drip.   1.3 cm right upper lung spiculated nodule: S/p bronchoscopy on 10/20/2023.   Pathology was negative for malignancy and findings more consistent with reactive bronchial cells with acute inflammation.   General weakness: PT and OT recommended discharge to SNF.       Diet Order             Diet Carb Modified Fluid consistency: Thin  Diet effective now                            Consultants: Community Education Officer Interventional radiologist  Procedures: Lumbar puncture on 10/08/2023 Bronchoscopy on 10/20/2023 Intubation and mechanical ventilation on  10/28/2023 EGD on 10/28/2023 IR embolization of duodenal artery on 10/30/2023    Medications:    arformoterol   15 mcg Nebulization BID   Chlorhexidine  Gluconate Cloth  6 each Topical Daily   docusate sodium   100 mg Oral BID   feeding supplement  237 mL Oral BID BM   furosemide   20 mg Oral Daily   insulin  aspart  0-5 Units Subcutaneous QHS   insulin  aspart  0-9 Units Subcutaneous TID WC   insulin  aspart  3 Units Subcutaneous TID WC   insulin  glargine-yfgn  10 Units Subcutaneous Daily   ipratropium-albuterol   3 mL Nebulization TID   levothyroxine   100 mcg Oral Q0600   multivitamin with minerals  1 tablet Oral Daily   mouth rinse  15 mL Mouth Rinse BID   pantoprazole   40 mg Oral BID   phosphorus  500 mg Oral BID   polyethylene glycol  17 g Oral Daily   [START ON 11/06/2023] predniSONE   35 mg Oral Q breakfast   Followed by   Michele House ON 11/07/2023] predniSONE   30 mg Oral Q breakfast   Followed by   Michele House ON 11/08/2023] predniSONE   25 mg Oral Q breakfast   Followed by   Michele House ON 11/09/2023] predniSONE   20 mg Oral Q breakfast   Followed by   Michele House ON 11/10/2023] predniSONE   15 mg Oral  Q breakfast   Followed by   Michele House ON 11/11/2023] predniSONE   10 mg Oral Q breakfast   rOPINIRole   4 mg Oral QHS   senna-docusate  1 tablet Oral Daily   sertraline   50 mg Oral Daily   Continuous Infusions:     Anti-infectives (From admission, onward)    Start     Dose/Rate Route Frequency Ordered Stop   10/31/23 2200  cefTRIAXone  (ROCEPHIN ) 1 g in sodium chloride  0.9 % 100 mL IVPB        1 g 200 mL/hr over 30 Minutes Intravenous Every 24 hours 10/31/23 1128 11/03/23 2216   10/30/23 2200  Vancomycin  (VANCOCIN ) 1,500 mg in sodium chloride  0.9 % 500 mL IVPB  Status:  Discontinued        1,500 mg 250 mL/hr over 120 Minutes Intravenous Every 48 hours 10/28/23 1956 10/29/23 0741   10/29/23 1400  metroNIDAZOLE  (FLAGYL ) IVPB 500 mg  Status:  Discontinued        500 mg 100 mL/hr over 60 Minutes Intravenous  Every 12 hours 10/29/23 1304 10/31/23 1427   10/28/23 2200  ceFEPIme  (MAXIPIME ) 2 g in sodium chloride  0.9 % 100 mL IVPB  Status:  Discontinued        2 g 200 mL/hr over 30 Minutes Intravenous Every 12 hours 10/28/23 2003 10/31/23 1127   10/28/23 2045  vancomycin  (VANCOCIN ) 1,500 mg in sodium chloride  0.9 % 500 mL IVPB        1,500 mg 257.5 mL/hr over 120 Minutes Intravenous  Once 10/28/23 1954 10/28/23 2050   10/04/23 1000  azithromycin  (ZITHROMAX ) tablet 500 mg        500 mg Oral Daily 10/03/23 0751 10/06/23 0856   10/03/23 0600  cefTRIAXone  (ROCEPHIN ) 2 g in sodium chloride  0.9 % 100 mL IVPB  Status:  Discontinued        2 g 200 mL/hr over 30 Minutes Intravenous Every 24 hours 10/02/23 0740 10/07/23 0854   10/02/23 0745  cefTRIAXone  (ROCEPHIN ) 2 g in sodium chloride  0.9 % 100 mL IVPB  Status:  Discontinued        2 g 200 mL/hr over 30 Minutes Intravenous Every 24 hours 10/02/23 0738 10/02/23 0740   10/02/23 0745  azithromycin  (ZITHROMAX ) 500 mg in sodium chloride  0.9 % 250 mL IVPB        500 mg 250 mL/hr over 60 Minutes Intravenous Every 24 hours 10/02/23 0738 10/03/23 1236   10/02/23 0630  cefTRIAXone  (ROCEPHIN ) 2 g in sodium chloride  0.9 % 100 mL IVPB        2 g 200 mL/hr over 30 Minutes Intravenous  Once 10/02/23 0617 10/02/23 0714              Family Communication/Anticipated D/C date and plan/Code Status   DVT prophylaxis: SCDs Start: 10/02/23 9261     Code Status: Do not attempt resuscitation (DNR) PRE-ARREST INTERVENTIONS DESIRED  Family Communication: None Disposition Plan: Plan to discharge to SNF    Status is: Inpatient Remains inpatient appropriate because: Awaiting placement to SNF       Subjective:   Interval events noted.  No shortness of breath or chest pain.  She did not use BiPAP last night.  Objective:    Vitals:   11/04/23 2304 11/05/23 0301 11/05/23 0621 11/05/23 0720  BP: 129/64 (!) 126/96  125/78  Pulse: 91 97    Resp: 18 18  16    Temp: 97.6 F (36.4 C) (!) 97.5 F (36.4 C)  97.7 F (  36.5 C)  TempSrc: Oral Oral    SpO2: 100% 93%  90%  Weight:   65.6 kg   Height:       No data found.   Intake/Output Summary (Last 24 hours) at 11/05/2023 1142 Last data filed at 11/04/2023 2100 Gross per 24 hour  Intake 240 ml  Output --  Net 240 ml    Filed Weights   11/02/23 0438 11/03/23 0411 11/05/23 0621  Weight: 68.1 kg 66.3 kg 65.6 kg    Exam:   GEN: NAD SKIN: Warm and dry EYES: No pallor or icterus ENT: MMM CV: RRR, tachycardic PULM: CTA B ABD: soft, ND, NT, +BS CNS: AAO x 3, right hemiparesis EXT: No edema or tenderness    Data Reviewed:   I have personally reviewed following labs and imaging studies:  Labs: Labs show the following:   Basic Metabolic Panel: Recent Labs  Lab 10/30/23 0515 10/31/23 0415 11/01/23 0453 11/02/23 0414 11/03/23 0401 11/04/23 0511 11/05/23 0438  NA 138   < > 144 145 140 139 139  K 3.9   < > 2.9* 3.4* 4.1 3.8 3.6  CL 101   < > 108 105 99 97* 93*  CO2 29   < > 30 34* 34* 33* 34*  GLUCOSE 115*   < > 104* 66* 90 112* 195*  BUN 47*   < > 24* 21 14 11 13   CREATININE 0.86   < > 0.75 0.68 0.53 0.48 0.60  CALCIUM  8.2*   < > 7.9* 8.2* 8.1* 8.3* 8.9  MG 2.1   < > 2.4 2.1 2.0 2.1 2.3  PHOS 3.0  --   --  1.8* 2.2* 2.8 2.1*   < > = values in this interval not displayed.   GFR Estimated Creatinine Clearance: 56.1 mL/min (by C-G formula based on SCr of 0.6 mg/dL). Liver Function Tests: No results for input(s): AST, ALT, ALKPHOS, BILITOT, PROT, ALBUMIN in the last 168 hours.  No results for input(s): LIPASE, AMYLASE in the last 168 hours. No results for input(s): AMMONIA in the last 168 hours.   Coagulation profile No results for input(s): INR, PROTIME in the last 168 hours.   CBC: Recent Labs  Lab 11/01/23 0453 11/02/23 0414 11/03/23 0401 11/04/23 0511 11/05/23 0438  WBC 7.1 5.2 4.5 5.1 7.5  HGB 7.9* 8.1* 8.3* 8.9* 10.6*  HCT 25.2*  25.4* 26.4* 28.0* 32.1*  MCV 96.6 94.4 96.4 96.6 91.2  PLT 314 243 219 208 268   Cardiac Enzymes: No results for input(s): CKTOTAL, CKMB, CKMBINDEX, TROPONINI in the last 168 hours. BNP (last 3 results) No results for input(s): PROBNP in the last 8760 hours. CBG: Recent Labs  Lab 11/04/23 1947 11/04/23 2019 11/04/23 2306 11/05/23 0517 11/05/23 0722  GLUCAP 220* 211* 159* 214* 288*   D-Dimer: No results for input(s): DDIMER in the last 72 hours. Hgb A1c: No results for input(s): HGBA1C in the last 72 hours. Lipid Profile: No results for input(s): CHOL, HDL, LDLCALC, TRIG, CHOLHDL, LDLDIRECT in the last 72 hours.  Thyroid  function studies: No results for input(s): TSH, T4TOTAL, T3FREE, THYROIDAB in the last 72 hours.  Invalid input(s): FREET3 Anemia work up: No results for input(s): VITAMINB12, FOLATE, FERRITIN, TIBC, IRON , RETICCTPCT in the last 72 hours. Sepsis Labs: Recent Labs  Lab 10/29/23 1215 10/30/23 0100 10/30/23 0515 10/31/23 0415 11/02/23 0414 11/03/23 0401 11/04/23 0511 11/05/23 0438  WBC 19.7* 17.2* 13.8*   < > 5.2 4.5 5.1 7.5  LATICACIDVEN 2.6* 0.9  0.9  --   --   --   --   --    < > = values in this interval not displayed.    Microbiology Recent Results (from the past 240 hours)  Culture, blood (Routine X 2) w Reflex to ID Panel     Status: None   Collection Time: 10/28/23  3:23 PM   Specimen: BLOOD  Result Value Ref Range Status   Specimen Description BLOOD LEFT ANTECUBITAL  Final   Special Requests   Final    BOTTLES DRAWN AEROBIC AND ANAEROBIC Blood Culture adequate volume   Culture   Final    NO GROWTH 5 DAYS Performed at [facility], [address] [place], [place] [address]    Report Status 11/02/2023 FINAL  Final  Culture, blood (Routine X 2) w Reflex to ID Panel     Status: None   Collection Time: 10/28/23  3:32 PM   Specimen: BLOOD  Result Value Ref Range Status   Specimen  Description BLOOD BLOOD LEFT HAND  Final   Special Requests   Final    BOTTLES DRAWN AEROBIC AND ANAEROBIC Blood Culture results may not be optimal due to an inadequate volume of blood received in culture bottles   Culture   Final    NO GROWTH 5 DAYS Performed at [facility], [address], [place], [place] [address]    Report Status 11/02/2023 FINAL  Final  MRSA Next Gen by PCR, Nasal     Status: None   Collection Time: 10/28/23  4:58 PM   Specimen: Nasal Mucosa; Nasal Swab  Result Value Ref Range Status   MRSA by PCR Next Gen NOT DETECTED NOT DETECTED Final    Comment: (NOTE) The GeneXpert MRSA Assay (FDA approved for NASAL specimens only), is one component of a comprehensive MRSA colonization surveillance program. It is not intended to diagnose MRSA infection nor to guide or monitor treatment for MRSA infections. Test performance is not FDA approved in patients less than 17 years old. Performed at [facility], [address] [place], [place] [address]   Culture, Respiratory w Gram Stain     Status: None   Collection Time: 10/28/23  6:41 PM   Specimen: Tracheal Aspirate; Respiratory  Result Value Ref Range Status   Specimen Description   Final    TRACHEAL ASPIRATE Performed at [facility], [address] [place], [place] [address]    Special Requests   Final    NONE Performed at [facility], [address] [place], [place] [address]    Gram Stain   Final    RARE WBC PRESENT, PREDOMINANTLY PMN NO ORGANISMS SEEN    Culture   Final    RARE Normal respiratory flora-no Staph aureus or Pseudomonas seen Performed at [facility], [address], [place], [place] [address]    Report Status 10/31/2023 FINAL  Final    Procedures and diagnostic studies:  No results found.              LOS: 34 days   Shajuan Musso  [employer] Chartered Loss Adjuster on [email or web address]. If 7PM-7AM, please contact night-coverage at  [email or web address]     11/05/2023, 11:42 AM

## 2023-11-05 NOTE — Inpatient Diabetes Management (Addendum)
 Inpatient Diabetes Program Recommendations  AACE/ADA: New Consensus Statement on Inpatient Glycemic Control (2025)  Target Ranges:  Prepandial:   less than 140 mg/dL      Peak postprandial:   less than 180 mg/dL (1-2 hours)      Critically ill patients:  140 - 180 mg/dL   Lab Results  Component Value Date   GLUCAP 293 (H) 11/05/2023   HGBA1C 8.4 (H) 10/15/2023    Review of Glycemic Control  Latest Reference Range & Units 11/04/23 19:47 11/04/23 20:19 11/04/23 23:06 11/05/23 05:17 11/05/23 07:22 11/05/23 11:48  Glucose-Capillary 70 - 99 mg/dL 779 (H) 788 (H) 840 (H) 214 (H) 288 (H) 293 (H)   Diabetes history: DM 2 Outpatient Diabetes medications:  Glipizide  10 mg with breakfast Tradjenta  5 mg daily Current orders for Inpatient glycemic control:  Novolog  0-9 units tid with meals and HS Novolog  3 units tid with meals (hold if patient eats less than 50%) Semglee  10 units daily Prednisone  taper Inpatient Diabetes Program Recommendations:    Consider increasing Semglee  to 15 units daily and increase Novolog  5 units tid with meals (hold if patient eats less than 50% or NPO).   Thanks,  Randall Bullocks, RN, BC-ADM Inpatient Diabetes Coordinator Pager [phone]  (8a-5p)

## 2023-11-05 NOTE — Progress Notes (Signed)
 NAME:  CHAUNTA BEJARANO, MRN:  [id number], DOB:  03/01/1956, LOS: 34 ADMISSION DATE:  10/02/2023   History of Present Illness:  Case of a 68 year old female patient with a past medical history of COPD with chronic respiratory failure on 2 L nasal cannula, hypertension, type 2 diabetes, heart failure with preserved EF who has been here for a month.  Initially presented with altered mental status encephalopathy.  Had an extensive workup including brain MRI which was negative for any acute intracranial abnormality initially.  However unfortunately course complicated by acute CVA with repeat MRI showing multiple acute infarcts in the high bilateral posterior frontal and parietal lobes.  She recovered from that but course further complicated by waxing and waning mental status which prompted an LP to rule out encephalitis and has been unremarkable so far.  She was found to have right upper lobe lung nodule which was concerning for malignancy and underwent robotic assisted navigational bronchoscopy with biopsy on 12/27 which showed very rare atypical cells and reactive alveolar cells mostly.    Pertinent  Medical History  As abnove  Significant Hospital Events: Including procedures, antibiotic start and stop dates in addition to other pertinent events   12/01: Vital stable, respiratory viral panel negative, procalcitonin negative, preliminary blood cultures negative.  Urine cultures are ordered as add-on, ammonia levels mildly elevated at 39, strep pneumo negative, CBG elevated at 244, history of enlarging pulmonary nodule with recommendations for PET/CT during most recent admission which has not been done yet, if video bronchoscopy was scheduled for 10/13/2023 as outpatient. 12/02: Unable to take care of herself and son cannot provide the required care as she required 24-hour supervision due to worsening dementia.  PT is recommending SNF. 12/03: Blood pressure started trending up, restarting home  Zestoretic .  Urine cultures with strep agalactiae, penicillin allergy noted, will continue with ceftriaxone  for now.  Also started on Remeron  for concern of worsening depression and unable to sleep at night  12/05:  Patient has some blurry vision and weakness and pain.  MRI of the brain negative for acute intracranial abnormality. 12/08:  Patient more confused today than yesterday.  Able to follow some simple commands but not as good of a conversation today as yesterday. 12/09:  Patient still confused today.  Does not follow simple commands today. 12/10: Patient more talkative today but was looking up at the ceiling when she was talking with me.  She does not move her extremities to command but does move them on her own.  Physical therapy and Occupational Therapy did not see her move her right arm. 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  Mild associated petechial hemorrhage.  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 12/12: Patient with new stroke, CTA head and neck with no large vessel occlusion.  Patient had a recent echocardiogram which was normal. Concern of paraneoplastic versus hypercoagulability secondary to malignancy.  Patient missed her appointment for bronchoscopy and outpatient appointment for PET scan due to recurrent hospitalizations. 12/13: Patient continued to have waxing and waning mental status, unable to move right upper and lower extremities.  Having some hallucination.  Pulmonary is planning lung biopsy on 12/27 if she remains stable.  Rapidly progressive dementia and now with underlying stroke, question of paraneoplastic encephalitis and hypercoagulability secondary to underlying undiagnosed malignancy. 12/15: Patient with much  improved mentation.  Having some flickering of movements on right  upper and lower extremity today, left extremities weaker but seems improving.   12/18: Pt underwent bronchoscopy and lung biopsy 12/19: patient stable , she is being optimized for dc for rehab.  She's cleared from pulmonary for discharge. Results from bronch will take 1 week.   12/26: Pt transferred to the stepdown unit with acute hypercapnic respiratory requiring Bipap but subsequently required mechanical intubation due to worsening respiratory failure.  Insulin  gtt initiated due to severe hyperglycemia.  With hemorrhagic shock due to bleeding duodenal ulcer, underwent emergent EGD with successful hemostasis by injection of Epinephrine  and hemostatic clip. 12/27: Hgb slowly trending down, no report of bleeding from OG suction nor melena. Weaning down vasopressors, if able to continue to wean vasopressors, then can consider WUA/SBT.  Giving temporizing measures for Hyperkalemia. Self extubated but required reintubation. Hgb continued to trend down, given 1 unit pRBC's and 2 FFP.  IR consulted for consideration for embolization. 12/28: Weaned off Vasopressin , Levophed  weaning down.  S/p IR embolization of the Duodenal artery. Peak pressures and lung sounds improved with Ketamine , will keep intubated today given rapid reintubation yesterday. 12/30- patient on PRVC FiO2 28%, weaning off ketamine  and hoping to perform SBT today.  Holding feeds until post SBT.  11/02/23- patient is stable overnight no acute events, electrllytes improved.  No GI bleeds noted.  Opitimizing for TRH today.  11/03/23 patient is more interactive this am, still has not been able to get OOB.  Will continue to follow her from pulmonary with COPD and lung cancer. Her pathology was sent for second opinion due to mixed reporting from cytotech and pathology report.  11/03/22- patient is good spirits this am. Son came to visit yesterday and we reivewed medical plan. H/h is stable. Following peripherally from pulmonary perspective for COPD and  lung mass.  11/05/23- patient on 3L/min.  She still has 9 day old Right internal jugular central line.  I have placed orders for RN to remove this line today.  She required BIPAP yesterday but states she did not feel SOB>   Interim History / Subjective:  Remains Intubated with significant wheezing and chest tightness.   Objective   Blood pressure 125/78, pulse 97, temperature 97.7 F (36.5 C), resp. rate 16, height 5' (1.524 m), weight 65.6 kg, last menstrual period 12/24/1998, SpO2 90%.    FiO2 (%):  [28 %] 28 %   Intake/Output Summary (Last 24 hours) at 11/05/2023 0803 Last data filed at 11/04/2023 2100 Gross per 24 hour  Intake 240 ml  Output --  Net 240 ml   Filed Weights   11/02/23 0438 11/03/23 0411 11/05/23 0621  Weight: 68.1 kg 66.3 kg 65.6 kg    Examination: General: Awake, follows simple commands  HENT: Supple neck reactive pupils  Lungs: mild wheezing b/l Cardiovascular: Normal S1, Normal S2, RRR  Abdomen: Soft, non tender, non distended, +BS  Extremities: Warm and well perfused no edema.   Labs and imaging were reviewed.   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  This has been sent for second opinion to be reviewed.  I think its most likely primary lung cancer.    Best Practice (right click and Reselect all SmartList Selections daily)   Diet/type: NPO DVT prophylaxis SCD Pressure ulcer(s): N/A GI prophylaxis: PPI Lines: Central line Foley:   Yes, and it is still needed Code Status:  full code     Fortune Torosian, M.D.  Pulmonary & Critical Care Medicine

## 2023-11-05 NOTE — Plan of Care (Signed)
  Problem: Education: Goal: Ability to describe self-care measures that may prevent or decrease complications (Diabetes Survival Skills Education) will improve Outcome: Progressing Goal: Individualized Educational Video(s) Outcome: Progressing   Problem: Coping: Goal: Ability to adjust to condition or change in health will improve Outcome: Progressing   Problem: Fluid Volume: Goal: Ability to maintain a balanced intake and output will improve Outcome: Progressing   Problem: Fluid Volume: Goal: Ability to maintain a balanced intake and output will improve Outcome: Progressing   Problem: Health Behavior/Discharge Planning: Goal: Ability to identify and utilize available resources and services will improve Outcome: Progressing   Problem: Health Behavior/Discharge Planning: Goal: Ability to identify and utilize available resources and services will improve Outcome: Progressing Goal: Ability to manage health-related needs will improve Outcome: Progressing   Problem: Metabolic: Goal: Ability to maintain appropriate glucose levels will improve Outcome: Progressing   Problem: Nutritional: Goal: Maintenance of adequate nutrition will improve Outcome: Progressing Goal: Progress toward achieving an optimal weight will improve Outcome: Progressing

## 2023-11-06 DIAGNOSIS — Z515 Encounter for palliative care: Secondary | ICD-10-CM | POA: Diagnosis not present

## 2023-11-06 DIAGNOSIS — G9341 Metabolic encephalopathy: Secondary | ICD-10-CM | POA: Diagnosis not present

## 2023-11-06 DIAGNOSIS — J441 Chronic obstructive pulmonary disease with (acute) exacerbation: Secondary | ICD-10-CM | POA: Diagnosis not present

## 2023-11-06 DIAGNOSIS — I639 Cerebral infarction, unspecified: Secondary | ICD-10-CM | POA: Diagnosis not present

## 2023-11-06 LAB — GLUCOSE, CAPILLARY
Glucose-Capillary: 161 mg/dL — ABNORMAL HIGH (ref 70–99)
Glucose-Capillary: 170 mg/dL — ABNORMAL HIGH (ref 70–99)
Glucose-Capillary: 218 mg/dL — ABNORMAL HIGH (ref 70–99)
Glucose-Capillary: 251 mg/dL — ABNORMAL HIGH (ref 70–99)
Glucose-Capillary: 252 mg/dL — ABNORMAL HIGH (ref 70–99)
Glucose-Capillary: 271 mg/dL — ABNORMAL HIGH (ref 70–99)
Glucose-Capillary: 319 mg/dL — ABNORMAL HIGH (ref 70–99)

## 2023-11-06 NOTE — Progress Notes (Signed)
 NAME:  Michele House, MRN:  [id number], DOB:  Feb 13, 1956, LOS: 35 ADMISSION DATE:  10/02/2023   History of Present Illness:  Case of a 68 year old female patient with a past medical history of COPD with chronic respiratory failure on 2 L nasal cannula, hypertension, type 2 diabetes, heart failure with preserved EF who has been here for a month.  Initially presented with altered mental status encephalopathy.  Had an extensive workup including brain MRI which was negative for any acute intracranial abnormality initially.  However unfortunately course complicated by acute CVA with repeat MRI showing multiple acute infarcts in the high bilateral posterior frontal and parietal lobes.  She recovered from that but course further complicated by waxing and waning mental status which prompted an LP to rule out encephalitis and has been unremarkable so far.  She was found to have right upper lobe lung nodule which was concerning for malignancy and underwent robotic assisted navigational bronchoscopy with biopsy on 12/27 which showed very rare atypical cells and reactive alveolar cells mostly.    Pertinent  Medical History  As abnove  Significant Hospital Events: Including procedures, antibiotic start and stop dates in addition to other pertinent events   12/01: Vital stable, respiratory viral panel negative, procalcitonin negative, preliminary blood cultures negative.  Urine cultures are ordered as add-on, ammonia levels mildly elevated at 39, strep pneumo negative, CBG elevated at 244, history of enlarging pulmonary nodule with recommendations for PET/CT during most recent admission which has not been done yet, if video bronchoscopy was scheduled for 10/13/2023 as outpatient. 12/02: Unable to take care of herself and son cannot provide the required care as she required 24-hour supervision due to worsening dementia.  PT is recommending SNF. 12/03: Blood pressure started trending up, restarting home  Zestoretic .  Urine cultures with strep agalactiae, penicillin allergy noted, will continue with ceftriaxone  for now.  Also started on Remeron  for concern of worsening depression and unable to sleep at night  12/05:  Patient has some blurry vision and weakness and pain.  MRI of the brain negative for acute intracranial abnormality. 12/08:  Patient more confused today than yesterday.  Able to follow some simple commands but not as good of a conversation today as yesterday. 12/09:  Patient still confused today.  Does not follow simple commands today. 12/10: Patient more talkative today but was looking up at the ceiling when she was talking with me.  She does not move her extremities to command but does move them on her own.  Physical therapy and Occupational Therapy did not see her move her right arm. 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  Mild associated petechial hemorrhage.  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 12/12: Patient with new stroke, CTA head and neck with no large vessel occlusion.  Patient had a recent echocardiogram which was normal. Concern of paraneoplastic versus hypercoagulability secondary to malignancy.  Patient missed her appointment for bronchoscopy and outpatient appointment for PET scan due to recurrent hospitalizations. 12/13: Patient continued to have waxing and waning mental status, unable to move right upper and lower extremities.  Having some hallucination.  Pulmonary is planning lung biopsy on 12/27 if she remains stable.  Rapidly progressive dementia and now with underlying stroke, question of paraneoplastic encephalitis and hypercoagulability secondary to underlying undiagnosed malignancy. 12/15: Patient with much  improved mentation.  Having some flickering of movements on right  upper and lower extremity today, left extremities weaker but seems improving.   12/18: Pt underwent bronchoscopy and lung biopsy 12/19: patient stable , she is being optimized for dc for rehab.  She's cleared from pulmonary for discharge. Results from bronch will take 1 week.   12/26: Pt transferred to the stepdown unit with acute hypercapnic respiratory requiring Bipap but subsequently required mechanical intubation due to worsening respiratory failure.  Insulin  gtt initiated due to severe hyperglycemia.  With hemorrhagic shock due to bleeding duodenal ulcer, underwent emergent EGD with successful hemostasis by injection of Epinephrine  and hemostatic clip. 12/27: Hgb slowly trending down, no report of bleeding from OG suction nor melena. Weaning down vasopressors, if able to continue to wean vasopressors, then can consider WUA/SBT.  Giving temporizing measures for Hyperkalemia. Self extubated but required reintubation. Hgb continued to trend down, given 1 unit pRBC's and 2 FFP.  IR consulted for consideration for embolization. 12/28: Weaned off Vasopressin , Levophed  weaning down.  S/p IR embolization of the Duodenal artery. Peak pressures and lung sounds improved with Ketamine , will keep intubated today given rapid reintubation yesterday. 12/30- patient on PRVC FiO2 28%, weaning off ketamine  and hoping to perform SBT today.  Holding feeds until post SBT.  11/02/23- patient is stable overnight no acute events, electrllytes improved.  No GI bleeds noted.  Opitimizing for TRH today.  11/03/23 patient is more interactive this am, still has not been able to get OOB.  Will continue to follow her from pulmonary with COPD and lung cancer. Her pathology was sent for second opinion due to mixed reporting from cytotech and pathology report.  11/03/22- patient is good spirits this am. Son came to visit yesterday and we reivewed medical plan. H/h is stable. Following peripherally from pulmonary perspective for COPD and  lung mass.  11/05/23- patient on 3L/min.  She still has 9 day old Right internal jugular central line.  I have placed orders for RN to remove this line today.  She required BIPAP yesterday but states she did not feel SOB>  11/06/23- patient shares she feels well today.  She is on 3L/min but takes it off at times. She reports breathing feels improved   Objective   Blood pressure (!) 145/70, pulse 95, temperature 97.7 F (36.5 C), resp. rate 18, height 5' (1.524 m), weight 64.2 kg, last menstrual period 12/24/1998, SpO2 100%.        Intake/Output Summary (Last 24 hours) at 11/06/2023 0846 Last data filed at 11/05/2023 1925 Gross per 24 hour  Intake 0 ml  Output 500 ml  Net -500 ml   Filed Weights   11/03/23 0411 11/05/23 0621 11/06/23 0324  Weight: 66.3 kg 65.6 kg 64.2 kg    Examination: General: Awake, follows simple commands  HENT: Supple neck reactive pupils  Lungs: mild wheezing b/l Cardiovascular: Normal S1, Normal S2, RRR  Abdomen: Soft, non tender, non distended, +BS  Extremities: Warm and well perfused no edema.   Labs and imaging were reviewed.   Assessment & Plan:    #1Acute hypoxic respiratory failure - RESOLVED  #2COPD/asthma exacerbation in the setting of metapneumovirus - IMPROVED  #3Acute anemia with hemorrhagic shock secondary to Duodenal ulcer s.p EGD clip and Injection 10/28/2023, s/p IR Embolization on 10/30/2023- S/P SLP WITH NOW HAVING NORMAL BM #4Metabolic encephalopathy- RESOLVED  #5Acute CVA during this admission with right sided hemiparesis- ONGOING PT #6AKI - RESOLVED  #7 - 16mm right upper lobe  nodule - reported as lesional carcinoma by [facility] but pathology showed rare atypical cells.  This has been sent for second opinion to be reviewed.  I think its most likely primary lung cancer.    Best Practice (right click and Reselect all SmartList Selections daily)   Diet/type: NPO DVT prophylaxis SCD Pressure ulcer(s): N/A GI prophylaxis: PPI Lines: Central  line Foley:  Yes, and it is still needed Code Status:  full code     Promyse Ardito, M.D.  Pulmonary & Critical Care Medicine

## 2023-11-06 NOTE — TOC Progression Note (Addendum)
 Transition of Care [facility]) - Progression Note    Patient Details  Name: Michele House MRN: [id number] Date of Birth: August 03, 1956  Transition of Care [facility]) CM/SW Contact  Alvia Olam Fabry, RN Phone Number: 11/06/2023, 4:02 PM  Clinical Narrative:    Received information that pt is medical ready for discharged to [facility]. Spoke with Gena at [facility] who will accept pt on Monday however requesting a call around 10:00 AM and re-certification.   TOC RN spoke with pt's daughter Michele House) concerning the discharge disposition for [facility] SN[facility] level of care. No request or inquires at this time. She is agreeable to this plan.       Expected Discharge Plan: Skilled Nursing Facility Barriers to Discharge: Continued Medical Work up  Expected Discharge Plan and Services                                               Social Determinants of Health (SDOH) Interventions SDOH Screenings   Food Insecurity: Patient Unable To Answer (10/03/2023)  Recent Concern: Food Insecurity - Food Insecurity Present (09/24/2023)  Housing: High Risk (10/03/2023)  Transportation Needs: Patient Unable To Answer (10/03/2023)  Recent Concern: Transportation Needs - Unmet Transportation Needs (09/24/2023)  Utilities: Patient Unable To Answer (10/03/2023)  Social Connections: Patient Declined (11/02/2023)  Tobacco Use: Medium Risk (10/20/2023)    Readmission Risk Interventions     No data to display

## 2023-11-06 NOTE — Progress Notes (Signed)
 Daily Progress Note   Patient Name: Michele House       Date: 11/06/2023 DOB: [birth year]  Age: 68 y.o. MRN#: [id number] Attending Physician: Michele Durand, MD Primary Care Physician: [facility] Admit Date: 10/02/2023  Reason for Consultation/Follow-up: Establishing goals of care  HPI/Brief Hospital Review: 68 y.o. female  with past medical history of COPD with chronic respiratory failure on 2L [place] at baseline, T2DM, HFpEF and hypertension admitted on 10/02/2023 with AMS.   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   Imaging also revealed right upper lode lung nodule   Developed respiratory distress on 12/23--found to have metapneumovirus, stabilized but unfortunately developed worsening respiratory distress and required mechanical ventilation S/p bronchoscopy 12/27 biopsy of lung nodule performed-very rare atypical cells and mostly reactive alveolar cells, concern for likely primary lung cancer   Course further complicated by acute anemia and development of shock S/p EGD oozing duodenal ulcer-clipped 12/26 S/p IR embolization to duodenal artery 12/28    Self extubated 12/27 soon after required reintubation   Successfully extubated 12/30, transferred out of ICU 1/2   Palliative medicine was consulted for assisting with goals of care conversations.  Subjective: Extensive chart review has been completed prior to meeting patient including labs, vital signs, imaging, progress notes, orders, and available advanced directive documents from current and previous encounters.    Visited with Michele House at her  bedside. She is awake, alert, able to answer most orientation questions but has intermittent confusion, likely delirium. She is able to recall our conversations from yesterday and remains clear she is not interested in discussing ongoing goals or wishes. She reports feeling well today without acute complaints. No family at bedside during time of visit.  Called and spoke with daughter-Michele House who shares she has not spoken with anyone regarding complaints shared yesterday. [name] also inquires about discharge planning. Updated charge nurse and will reach out to primary team and [facility] regarding discharge planning.  PMT to continue to follow for ongoing needs and support.  Care plan was discussed with nursing staff, primary team and TOC.  Thank you for allowing the Palliative Medicine Team to assist in the care of this patient.  Total  time:  35 minutes  Time spent includes: Detailed review of medical records (labs, imaging, vital signs), medically appropriate exam (mental status, respiratory, cardiac, skin), discussed with treatment team, counseling and educating patient, family and staff, documenting clinical information, medication management and coordination of care.  Michele Lesches, DNP, AGNP-C Palliative Medicine   Please contact Palliative Medicine Team phone at [phone] for questions and concerns.

## 2023-11-06 NOTE — Plan of Care (Signed)
  Problem: Education: Goal: Ability to describe self-care measures that may prevent or decrease complications (Diabetes Survival Skills Education) will improve Outcome: Progressing Goal: Individualized Educational Video(s) Outcome: Progressing   Problem: Coping: Goal: Ability to adjust to condition or change in health will improve Outcome: Progressing   Problem: Fluid Volume: Goal: Ability to maintain a balanced intake and output will improve Outcome: Progressing   Problem: Health Behavior/Discharge Planning: Goal: Ability to identify and utilize available resources and services will improve Outcome: Progressing Goal: Ability to manage health-related needs will improve Outcome: Progressing   Problem: Metabolic: Goal: Ability to maintain appropriate glucose levels will improve Outcome: Progressing   Problem: Nutritional: Goal: Maintenance of adequate nutrition will improve Outcome: Progressing Goal: Progress toward achieving an optimal weight will improve Outcome: Progressing   Problem: Skin Integrity: Goal: Risk for impaired skin integrity will decrease Outcome: Progressing   Problem: Tissue Perfusion: Goal: Adequacy of tissue perfusion will improve Outcome: Progressing   Problem: Education: Goal: Knowledge of General Education information will improve Description: Including pain rating scale, medication(s)/side effects and non-pharmacologic comfort measures Outcome: Progressing   Problem: Health Behavior/Discharge Planning: Goal: Ability to manage health-related needs will improve Outcome: Progressing   Problem: Clinical Measurements: Goal: Ability to maintain clinical measurements within normal limits will improve Outcome: Progressing Goal: Will remain free from infection Outcome: Progressing Goal: Diagnostic test results will improve Outcome: Progressing Goal: Respiratory complications will improve Outcome: Progressing Goal: Cardiovascular complication will  be avoided Outcome: Progressing   Problem: Activity: Goal: Risk for activity intolerance will decrease Outcome: Progressing   Problem: Nutrition: Goal: Adequate nutrition will be maintained Outcome: Progressing   Problem: Coping: Goal: Level of anxiety will decrease Outcome: Progressing   Problem: Elimination: Goal: Will not experience complications related to bowel motility Outcome: Progressing Goal: Will not experience complications related to urinary retention Outcome: Progressing   Problem: Pain Management: Goal: General experience of comfort will improve Outcome: Progressing   Problem: Safety: Goal: Ability to remain free from injury will improve Outcome: Progressing   Problem: Skin Integrity: Goal: Risk for impaired skin integrity will decrease Outcome: Progressing   Problem: Education: Goal: Knowledge of disease or condition will improve Outcome: Progressing Goal: Knowledge of the prescribed therapeutic regimen will improve Outcome: Progressing Goal: Individualized Educational Video(s) Outcome: Progressing   Problem: Activity: Goal: Ability to tolerate increased activity will improve Outcome: Progressing Goal: Will verbalize the importance of balancing activity with adequate rest periods Outcome: Progressing   Problem: Respiratory: Goal: Ability to maintain a clear airway will improve Outcome: Progressing Goal: Levels of oxygenation will improve Outcome: Progressing Goal: Ability to maintain adequate ventilation will improve Outcome: Progressing   Problem: Activity: Goal: Ability to tolerate increased activity will improve Outcome: Progressing   Problem: Clinical Measurements: Goal: Ability to maintain a body temperature in the normal range will improve Outcome: Progressing   Problem: Respiratory: Goal: Ability to maintain adequate ventilation will improve Outcome: Progressing Goal: Ability to maintain a clear airway will improve Outcome:  Progressing

## 2023-11-06 NOTE — Progress Notes (Signed)
 Progress Note    Michele House  FMW:[id number] DOB: 1956/10/26  DOA: 10/02/2023 PCP: [facility]      Brief Narrative:    Medical records reviewed and are as summarized below:  Michele House is a 68 y.o. female with medical history significant of COPD, chronic respiratory failure on 2 L, hypertension, type 2 diabetes, HFpEF, recent discharge from the hospital 24th November 2024 after hospitalization for COPD exacerbation complicated by respiratory failure.  She presented to the hospital again on 10/02/2023 with chest pain, malaise, cough, shortness of breath, abdominal pain and dysuria.  She was admitted to the hospital for COPD exacerbation, acute on chronic hypoxemic respiratory failure, UTI and pneumonia.  She became more confused in the hospital and MRI brain revealed acute stroke.        12/1: Vital stable, respiratory viral panel negative, procalcitonin negative, preliminary blood cultures negative.  Urine cultures are ordered as add-on, ammonia levels mildly elevated at 39, strep pneumo negative, CBG elevated at 244, history of enlarging pulmonary nodule with recommendations for PET/CT during most recent admission which has not been done yet, if video bronchoscopy was scheduled for 10/13/2023 as outpatient.  Unable to take care of himself and son cannot provide the required care as she required 24-hour supervision due to worsening dementia.  PT is recommending SNF.  12/2: Hemodynamically stable, now on baseline oxygen of 2 L.  Needs SNF placement. Urine cultures pending.  12/3: Blood pressure started trending up, restarting home Zestoretic .  Urine cultures with strep agalactiae, penicillin allergy noted, will continue with ceftriaxone  for now.  Also started on Remeron  for concern of worsening depression and unable to sleep at night  12/4.  Some very concerned about patient's mental status not being seen.  Patient receiving antibiotics already  and should have improved by now.  Will hold Lipitor.  Restart low-dose Zoloft .  Discontinue Remeron  and do Seroquel  at night. 12/5.  Patient has some blurry vision and weakness and pain.  MRI of the brain negative for acute intracranial abnormality. 12/6.  Patient stated she slept well.  Patient able to lift up her arms up off the bed today.  Patient thought she heard her son's voice outside the room. 12/7.  Patient sitting up in chair when I saw her.  Was able to feed herself breakfast.  Felt okay.  Did not offer any complaints. 12/8.  Patient more confused today than yesterday.  Able to follow some simple commands but not as good of a conversation today as yesterday. 12/9.  Patient still confused today.  Does not follow simple commands today. 12/10.  Patient more talkative today but was looking up at the ceiling when she was talking with me.  She does not move her extremities to command but does move them on her own.  Physical therapy and Occupational Therapy did not see her move her right arm. 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  Mild associated petechial hemorrhage.  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 12/12: Patient with new stroke, CTA head and neck with no large vessel occlusion.  Patient had a recent echocardiogram which was normal. CSF cultures remain negative.  Lipid panel with increased triglyceride at 347, HDL 29 and LDL of 38.  CBC with mild thrombocytopenia at 130, BMP mostly unremarkable.  Patient with significant right-sided weakness. Concern of paraneoplastic versus hypercoagulability secondary to malignancy.  Patient missed her appointment for bronchoscopy and outpatient appointment for PET scan due to recurrent hospitalizations. Involving pulmonary and oncology for their input. Send  out encephalitis panel still pending.  12/13: Patient continued to have waxing and waning mental status, unable to move right upper and lower extremities.  Having some hallucination.  Pulmonary is planning lung biopsy on 12/27 if she remains stable.  Rapidly progressive dementia and now with underlying stroke, question of paraneoplastic encephalitis and hypercoagulability secondary to underlying undiagnosed malignancy. Patient will also get benefit from neuropsych evaluation as outpatient.  12/14: Patient with some improved mentation today.  Trying to squeeze with right hand but still no movements of right lower extremity.  12/15: Patient with much improved mentation.  Having some flickering of movements on right upper and lower extremity today, left extremities weaker but seems improving.   12/16: Mental status seems stable, flaccid right upper and lower extremities.  Complaining of muscle spasms so Robaxin  was ordered.  Pulmonary is trying to do bronchoscopy and biopsy on Wednesday.  12/18: Pt underwent bronchoscopy and lung biopsy 12/19: patient stable , she is being optimized for dc for rehab.  She's cleared from pulmonary for discharge. Results from bronch will take 1 week.   12/26: Pt transferred to the stepdown unit with acute hypercapnic respiratory requiring Bipap but subsequently required mechanical intubation due to worsening respiratory failure.  Insulin  gtt initiated due to severe hyperglycemia.  With hemorrhagic shock due to bleeding duodenal ulcer, underwent emergent EGD with successful hemostasis by injection of Epinephrine  and hemostatic clip. 12/27: Hgb slowly trending down, no report of bleeding from OG suction nor melena. Weaning down vasopressors, if able to continue to wean vasopressors, then can consider WUA/SBT.  Giving temporizing measures for Hyperkalemia. Self extubated but required reintubation. Hgb continued to trend down, given 1 unit pRBC's and 2 FFP.  IR consulted for  consideration for embolization. 12/28: Weaned off Vasopressin , Levophed  weaning down.  S/p IR embolization of the Duodenal artery. Peak pressures and lung sounds improved with Ketamine , will keep intubated today given rapid reintubation yesterday. 12/30- patient on PRVC FiO2 28%, weaning off ketamine  and hoping to perform SBT today.  Holding feeds until post SBT.  11/02/23- patient is stable overnight no acute events, electrllytes improved.  No GI bleeds noted.  Opitimizing for TRH today.     Assessment/Plan:   Principal Problem:   Acute metabolic encephalopathy Active Problems:   CVA (cerebral vascular accident) (HCC)   Uncontrolled type 2 diabetes mellitus with hypoglycemia, without long-term current use of insulin  (HCC)   Hypotension   Acute on chronic respiratory failure with hypoxia (HCC)   Hypernatremia   COPD exacerbation (HCC)   Lung nodule   Pneumonia   UTI (urinary tract infection)   (HFpEF) heart failure with preserved ejection fraction (HCC)   Essential hypertension   Type 2 diabetes mellitus with obesity (HCC)   Melena   Gastrointestinal hemorrhage with melena   Duodenal ulcer    Body mass index is 27.64 kg/m.   Acute metabolic encephalopathy: Improved. Initially completed 5 days of high-dose several IV Solu-Medrol  on 10/12/2023 Differential diagnosis include autoimmune encephalitis   Acute stroke with right hemiplegia: Off of Plavix  because of recent acute GI bleeding.  Right-sided weakness slowly improving. 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   Acute GI bleeding, bleeding duodenal ulcer: Continue Protonix . S/p EGD on 10/28/2023 with clip placed.  S/p IR embolization on 10/30/2023.     Hemorrhagic shock: Resolved Hypotension: Resolved.  Off of  vasopressors   Acute blood loss anemia: H&H is stable.  S/p transfusion with 2 units of PRBCs on 10/28/2023, 1 unit of PRBCs on 10/29/2023.  S/p transfusion with 2 units of platelet pheresis on 10/29/2023. H&H is stable.   Pneumonia: Initially completed 5 days of IV ceftriaxone  and azithromycin  on 10/07/2023. She was restarted on broad-spectrum antibiotics (vancomycin , ceftriaxone  and Flagyl ) on 10/28/2023 when she decompensated requiring transfer to the ICU. Acute UTI: Urine culture showed strep agalactiae.  Completed antibiotics.   Acute on chronic diastolic CHF: Stable.  S/p treatment with IV Lasix .  Continue oral Lasix  Anxiety/panic attacks: Ativan  as needed.  Continue sertraline    COPD exacerbation: This had previously resolved.  However, she had recurrent COPD exacerbation and was restarted on steroids. Continue to taper off prednisone .   Acute on chronic hypoxemic respiratory failure, chronic hypercapnic respiratory failure: She was intubated on 10/28/2023 and extubated on 11/01/2023. She is tolerating 2 L/min oxygen via nasal cannula. She was strongly encouraged to use BiPAP every night to reduce risk of recurrent COPD exacerbation or worsening espiratory failure.   Type II DM with severe hyperglycemia: Continue Semglee  and NovoLog .  Monitor glucose levels and adjust insulin  as needed. S/p treatment with IV insulin  drip.   1.3 cm right upper lung spiculated nodule: S/p bronchoscopy on 10/20/2023.   Pathology was negative for malignancy and findings more consistent with reactive bronchial cells with acute inflammation.   General weakness: PT and OT recommended discharge to SNF.       Diet Order             Diet Carb Modified Fluid consistency: Thin  Diet effective now                            Consultants: Community Education Officer Interventional radiologist  Procedures: Lumbar puncture on  10/08/2023 Bronchoscopy on 10/20/2023 Intubation and mechanical ventilation on 10/28/2023 EGD on 10/28/2023 IR embolization of duodenal artery on 10/30/2023    Medications:    arformoterol   15 mcg Nebulization BID   Chlorhexidine  Gluconate Cloth  6 each Topical Daily   docusate sodium   100 mg Oral BID   feeding supplement  237 mL Oral BID BM   furosemide   20 mg Oral Daily   insulin  aspart  0-5 Units Subcutaneous QHS   insulin  aspart  0-9 Units Subcutaneous TID WC   insulin  aspart  3 Units Subcutaneous TID WC   insulin  glargine-yfgn  10 Units Subcutaneous Daily   ipratropium-albuterol   3 mL Nebulization TID   levothyroxine   100 mcg Oral Q0600   multivitamin with minerals  1 tablet Oral Daily   mouth rinse  15 mL Mouth Rinse BID   pantoprazole   40 mg Oral BID   polyethylene glycol  17 g Oral Daily   [START ON 11/07/2023] predniSONE   30 mg Oral Q breakfast   Followed by   NOREEN ON 11/08/2023] predniSONE   25 mg Oral Q breakfast   Followed by   NOREEN ON 11/09/2023] predniSONE   20 mg Oral Q breakfast   Followed by   NOREEN ON 11/10/2023] predniSONE   15 mg Oral Q breakfast   Followed by   NOREEN ON 11/11/2023] predniSONE   10 mg  Oral Q breakfast   rOPINIRole   4 mg Oral QHS   senna-docusate  1 tablet Oral Daily   sertraline   50 mg Oral Daily   Continuous Infusions:     Anti-infectives (From admission, onward)    Start     Dose/Rate Route Frequency Ordered Stop   10/31/23 2200  cefTRIAXone  (ROCEPHIN ) 1 g in sodium chloride  0.9 % 100 mL IVPB        1 g 200 mL/hr over 30 Minutes Intravenous Every 24 hours 10/31/23 1128 11/03/23 2216   10/30/23 2200  Vancomycin  (VANCOCIN ) 1,500 mg in sodium chloride  0.9 % 500 mL IVPB  Status:  Discontinued        1,500 mg 250 mL/hr over 120 Minutes Intravenous Every 48 hours 10/28/23 1956 10/29/23 0741   10/29/23 1400  metroNIDAZOLE  (FLAGYL ) IVPB 500 mg  Status:  Discontinued        500 mg 100 mL/hr over 60 Minutes Intravenous Every 12 hours 10/29/23  1304 10/31/23 1427   10/28/23 2200  ceFEPIme  (MAXIPIME ) 2 g in sodium chloride  0.9 % 100 mL IVPB  Status:  Discontinued        2 g 200 mL/hr over 30 Minutes Intravenous Every 12 hours 10/28/23 2003 10/31/23 1127   10/28/23 2045  vancomycin  (VANCOCIN ) 1,500 mg in sodium chloride  0.9 % 500 mL IVPB        1,500 mg 257.5 mL/hr over 120 Minutes Intravenous  Once 10/28/23 1954 10/28/23 2050   10/04/23 1000  azithromycin  (ZITHROMAX ) tablet 500 mg        500 mg Oral Daily 10/03/23 0751 10/06/23 0856   10/03/23 0600  cefTRIAXone  (ROCEPHIN ) 2 g in sodium chloride  0.9 % 100 mL IVPB  Status:  Discontinued        2 g 200 mL/hr over 30 Minutes Intravenous Every 24 hours 10/02/23 0740 10/07/23 0854   10/02/23 0745  cefTRIAXone  (ROCEPHIN ) 2 g in sodium chloride  0.9 % 100 mL IVPB  Status:  Discontinued        2 g 200 mL/hr over 30 Minutes Intravenous Every 24 hours 10/02/23 0738 10/02/23 0740   10/02/23 0745  azithromycin  (ZITHROMAX ) 500 mg in sodium chloride  0.9 % 250 mL IVPB        500 mg 250 mL/hr over 60 Minutes Intravenous Every 24 hours 10/02/23 0738 10/03/23 1236   10/02/23 0630  cefTRIAXone  (ROCEPHIN ) 2 g in sodium chloride  0.9 % 100 mL IVPB        2 g 200 mL/hr over 30 Minutes Intravenous  Once 10/02/23 0617 10/02/23 0714              Family Communication/Anticipated D/C date and plan/Code Status   DVT prophylaxis: SCDs Start: 10/02/23 9261     Code Status: Do not attempt resuscitation (DNR) PRE-ARREST INTERVENTIONS DESIRED  Family Communication: None Disposition Plan: Plan to discharge to SNF    Status is: Inpatient Remains inpatient appropriate because: Awaiting placement to SNF       Subjective:   Interval events noted.  She has no complaints.  She said she feels much better today.  She said she had the best night in the hospital last night.  She said she slept very well.  She did not use BiPAP last night.  Objective:    Vitals:   11/06/23 0324 11/06/23 0429  11/06/23 0729 11/06/23 1205  BP:  (!) 141/77 (!) 145/70 139/71  Pulse:  96 95 96  Resp:  18    Temp:  97.7 F (36.5 C) 97.7 F (36.5 C) 97.8 F (36.6 C)  TempSrc:  Oral  Oral  SpO2:  98% 100%   Weight: 64.2 kg     Height:       No data found.   Intake/Output Summary (Last 24 hours) at 11/06/2023 1218 Last data filed at 11/05/2023 1925 Gross per 24 hour  Intake 0 ml  Output 500 ml  Net -500 ml    Filed Weights   11/03/23 0411 11/05/23 0621 11/06/23 0324  Weight: 66.3 kg 65.6 kg 64.2 kg    Exam:  GEN: NAD SKIN: Warm and dry EYES: No pallor or icterus ENT: MMM CV: RRR PULM: CTA B ABD: soft, ND, NT, +BS CNS: AAO x 3, right hemiparesis EXT: No edema or tenderness    Data Reviewed:   I have personally reviewed following labs and imaging studies:  Labs: Labs show the following:   Basic Metabolic Panel: Recent Labs  Lab 11/01/23 0453 11/02/23 0414 11/03/23 0401 11/04/23 0511 11/05/23 0438  NA 144 145 140 139 139  K 2.9* 3.4* 4.1 3.8 3.6  CL 108 105 99 97* 93*  CO2 30 34* 34* 33* 34*  GLUCOSE 104* 66* 90 112* 195*  BUN 24* 21 14 11 13   CREATININE 0.75 0.68 0.53 0.48 0.60  CALCIUM  7.9* 8.2* 8.1* 8.3* 8.9  MG 2.4 2.1 2.0 2.1 2.3  PHOS  --  1.8* 2.2* 2.8 2.1*   GFR Estimated Creatinine Clearance: 55.5 mL/min (by C-G formula based on SCr of 0.6 mg/dL). Liver Function Tests: No results for input(s): AST, ALT, ALKPHOS, BILITOT, PROT, ALBUMIN in the last 168 hours.  No results for input(s): LIPASE, AMYLASE in the last 168 hours. No results for input(s): AMMONIA in the last 168 hours.   Coagulation profile No results for input(s): INR, PROTIME in the last 168 hours.   CBC: Recent Labs  Lab 11/01/23 0453 11/02/23 0414 11/03/23 0401 11/04/23 0511 11/05/23 0438  WBC 7.1 5.2 4.5 5.1 7.5  HGB 7.9* 8.1* 8.3* 8.9* 10.6*  HCT 25.2* 25.4* 26.4* 28.0* 32.1*  MCV 96.6 94.4 96.4 96.6 91.2  PLT 314 243 219 208 268   Cardiac  Enzymes: No results for input(s): CKTOTAL, CKMB, CKMBINDEX, TROPONINI in the last 168 hours. BNP (last 3 results) No results for input(s): PROBNP in the last 8760 hours. CBG: Recent Labs  Lab 11/05/23 1634 11/05/23 2036 11/06/23 0409 11/06/23 0732 11/06/23 1127  GLUCAP 165* 183* 161* 170* 251*   D-Dimer: No results for input(s): DDIMER in the last 72 hours. Hgb A1c: No results for input(s): HGBA1C in the last 72 hours. Lipid Profile: No results for input(s): CHOL, HDL, LDLCALC, TRIG, CHOLHDL, LDLDIRECT in the last 72 hours.  Thyroid  function studies: No results for input(s): TSH, T4TOTAL, T3FREE, THYROIDAB in the last 72 hours.  Invalid input(s): FREET3 Anemia work up: No results for input(s): VITAMINB12, FOLATE, FERRITIN, TIBC, IRON , RETICCTPCT in the last 72 hours. Sepsis Labs: Recent Labs  Lab 11/02/23 0414 11/03/23 0401 11/04/23 0511 11/05/23 0438  WBC 5.2 4.5 5.1 7.5    Microbiology Recent Results (from the past 240 hours)  Culture, blood (Routine X 2) w Reflex to ID Panel     Status: None   Collection Time: 10/28/23  3:23 PM   Specimen: BLOOD  Result Value Ref Range Status   Specimen Description BLOOD LEFT ANTECUBITAL  Final   Special Requests   Final    BOTTLES DRAWN AEROBIC AND ANAEROBIC Blood Culture adequate volume  Culture   Final    NO GROWTH 5 DAYS Performed at [facility], [address] [place], [place] [address]    Report Status 11/02/2023 FINAL  Final  Culture, blood (Routine X 2) w Reflex to ID Panel     Status: None   Collection Time: 10/28/23  3:32 PM   Specimen: BLOOD  Result Value Ref Range Status   Specimen Description BLOOD BLOOD LEFT HAND  Final   Special Requests   Final    BOTTLES DRAWN AEROBIC AND ANAEROBIC Blood Culture results may not be optimal due to an inadequate volume of blood received in culture bottles   Culture   Final    NO GROWTH 5 DAYS Performed at [facility], [address], [place], [place] [address]    Report Status 11/02/2023 FINAL  Final  MRSA Next Gen by PCR, Nasal     Status: None   Collection Time: 10/28/23  4:58 PM   Specimen: Nasal Mucosa; Nasal Swab  Result Value Ref Range Status   MRSA by PCR Next Gen NOT DETECTED NOT DETECTED Final    Comment: (NOTE) The GeneXpert MRSA Assay (FDA approved for NASAL specimens only), is one component of a comprehensive MRSA colonization surveillance program. It is not intended to diagnose MRSA infection nor to guide or monitor treatment for MRSA infections. Test performance is not FDA approved in patients less than 58 years old. Performed at [facility], [address] [place], [place] [address]   Culture, Respiratory w Gram Stain     Status: None   Collection Time: 10/28/23  6:41 PM   Specimen: Tracheal Aspirate; Respiratory  Result Value Ref Range Status   Specimen Description   Final    TRACHEAL ASPIRATE Performed at [facility], [address] [place], [place] [address]    Special Requests   Final    NONE Performed at [facility], [address] [place], [place] [address]    Gram Stain   Final    RARE WBC PRESENT, PREDOMINANTLY PMN NO ORGANISMS SEEN    Culture   Final    RARE Normal respiratory flora-no Staph aureus or Pseudomonas seen Performed at [facility], [address] [place], [place] [address]    Report Status 10/31/2023 FINAL  Final    Procedures and diagnostic studies:  No results found.              LOS: 35 days   Maleea Camilo  [employer] Loss Adjuster on [email or web address]. If 7PM-7AM, please contact night-coverage at [email or web address]     11/06/2023, 12:18 PM

## 2023-11-07 DIAGNOSIS — Z515 Encounter for palliative care: Secondary | ICD-10-CM | POA: Diagnosis not present

## 2023-11-07 DIAGNOSIS — I5032 Chronic diastolic (congestive) heart failure: Secondary | ICD-10-CM | POA: Diagnosis not present

## 2023-11-07 DIAGNOSIS — G9341 Metabolic encephalopathy: Secondary | ICD-10-CM | POA: Diagnosis not present

## 2023-11-07 DIAGNOSIS — I639 Cerebral infarction, unspecified: Secondary | ICD-10-CM | POA: Diagnosis not present

## 2023-11-07 DIAGNOSIS — J441 Chronic obstructive pulmonary disease with (acute) exacerbation: Secondary | ICD-10-CM | POA: Diagnosis not present

## 2023-11-07 LAB — GLUCOSE, CAPILLARY
Glucose-Capillary: 115 mg/dL — ABNORMAL HIGH (ref 70–99)
Glucose-Capillary: 116 mg/dL — ABNORMAL HIGH (ref 70–99)
Glucose-Capillary: 228 mg/dL — ABNORMAL HIGH (ref 70–99)
Glucose-Capillary: 353 mg/dL — ABNORMAL HIGH (ref 70–99)
Glucose-Capillary: 257 mg/dL — ABNORMAL HIGH (ref 70–99)

## 2023-11-07 NOTE — Progress Notes (Signed)
 Daily Progress Note   Patient Name: Michele House       Date: 11/07/2023 DOB: 1955-12-20  Age: 67 y.o. MRN#: [id number] Attending Physician: Michele Durand, MD Primary Care Physician: [facility] Admit Date: 10/02/2023  Reason for Consultation/Follow-up: Establishing goals of care  HPI/Brief Hospital Review: 68 y.o. female  with past medical history of COPD with chronic respiratory failure on 2L [place] at baseline, T2DM, HFpEF and hypertension admitted on 10/02/2023 with AMS.   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   Imaging also revealed right upper lode lung nodule   Developed respiratory distress on 12/23--found to have metapneumovirus, stabilized but unfortunately developed worsening respiratory distress and required mechanical ventilation S/p bronchoscopy 12/27 biopsy of lung nodule performed-very rare atypical cells and mostly reactive alveolar cells, concern for likely primary lung cancer   Course further complicated by acute anemia and development of shock S/p EGD oozing duodenal ulcer-clipped 12/26 S/p IR embolization to duodenal artery 12/28    Self extubated 12/27 soon after required reintubation   Successfully extubated 12/30, transferred out of ICU 1/2   Palliative medicine was consulted for assisting with goals of care conversations.  Subjective: Extensive chart review has been completed prior to meeting patient including labs, vital signs, imaging, progress notes, orders, and available advanced directive documents from current and previous encounters.    Visited with Michele House at her  bedside. She is awake, alert and able to engage in conversation. Intermittent confusion remains. Assisted her with two cups of grape juice which she tolerated well. She denies acute pain or discomfort.  She is aware she is headed to [facility] for STR and is hopeful she will be transferred tomorrow. She is looking forward to rehab and ultimately being able to return home once her strength has returned.  No ongoing acute palliative needs at this time. PMT to follow peripherally, please reengage as appropriate.  Thank you for allowing the Palliative Medicine Team to assist in the care of this patient.  Total time:  25 minutes  Time spent includes: Detailed review of medical records (labs, imaging, vital signs), medically appropriate exam (mental status, respiratory, cardiac, skin), discussed with treatment team, counseling  and educating patient, family and staff, documenting clinical information, medication management and coordination of care.  Waddell Lesches, DNP, AGNP-C Palliative Medicine   Please contact Palliative Medicine Team phone at [phone] for questions and concerns.

## 2023-11-07 NOTE — Progress Notes (Signed)
 Physical Therapy Treatment Patient Details Name: Michele House MRN: [id number] DOB: 23-Sep-1956 Today's Date: 11/07/2023   History of Present Illness Patient is a 68 year old with prolonged hospital stay, originally admitted with AMS. 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 She developed respiratory distress requiring mechanical ventilation. Self extubated 12/27 with re-intubation and extubation on 12/30.    PT Comments  Patient supine in bed upon arrival to room; alert, but grossly unaware of situation and overall functional deficits.  Session focused on unsupported sitting balance and upper/lower body bathing; requiring total assist +2 to complete. Patient with poor sitting balance; absent awareness of midline, absent righting reactions, requiring total assist to maintain upright and recover repeated LOB during functional tasks.   Significant difficulty visually scanning, tracking and locating objects during session; marked R-side inattention noted throughout session.   Absent active movement to R hemi-body during session; completely insensate to light touch, deep pressure and pain.  1-thumb width sublux remains to R shoulder (supported on pillows end of session for joint approximation/protection).    If plan is discharge home, recommend the following: Two people to help with walking and/or transfers;Two people to help with bathing/dressing/bathroom;Assistance with cooking/housework;Direct supervision/assist for medications management;Assistance with feeding;Direct supervision/assist for financial management;Assist for transportation;Help with stairs or ramp for entrance;Supervision due to cognitive status   Can travel by private vehicle     No  Equipment Recommendations       Recommendations for Other Services       Precautions / Restrictions Precautions Precaution Comments: right side hemiparesis Restrictions Weight Bearing Restrictions Per  Provider Order: No     Mobility  Bed Mobility Overal bed mobility: Needs Assistance Bed Mobility: Supine to Sit, Sit to Supine Rolling: Total assist   Supine to sit: Total assist Sit to supine: Total assist   General bed mobility comments: hand-over-hand to initiate and complete all functional activities    Transfers                   General transfer comment: unable/unsafe    Ambulation/Gait               General Gait Details: unable/unsafe   Stairs             Wheelchair Mobility     Tilt Bed    Modified Rankin (Stroke Patients Only)       Balance Overall balance assessment: Needs assistance Sitting-balance support: No upper extremity supported, Feet supported Sitting balance-Leahy Scale: Zero Sitting balance - Comments: absent balance reactions, absent awareness of midline; total assist for sitting balance, midline (all planes) at all times       Standing balance comment: unable/unsafe                            Cognition Arousal: Alert Behavior During Therapy: Impulsive                                   General Comments: Oriented to self, location; intermittently follows simple commands.  Highly distracted by internal stimuli.  Poor insight/awareness into deficits and need for assist with all functional activities        Exercises Other Exercises Other Exercises: Unsupported sitting edge of bed, total assist for balance/midline--poor balance, righting, midline orientation.  Patient intermittently 'lunging' forward to see things in room; absent  awareness of balance deficits and need for assist; total assist to prevent fall Other Exercises: Upper body bathing in unsupported sitting, max/dep assist; poor ability to sequence, coordinate task.  Suspect some underlying visual deficits (noted deficits in visually tracking, scanning and locating items) Other Exercises: Lower body bathing in supine, dep assist;  total/dep assist for rolling Other Exercises: Repositioned to comfort in supine; pillow under R UE for support (and approximation of R shoulder subluxation-1 thumb width) Other Exercises: No active movement of R UE/LE noted during this session; absent sensory awareness, absent attention to/awareness of R hemi-body.    General Comments        Pertinent Vitals/Pain Pain Assessment Pain Assessment: No/denies pain    Home Living                          Prior Function            PT Goals (current goals can now be found in the care plan section) Acute Rehab PT Goals Patient Stated Goal: to get out of bed PT Goal Formulation: With patient Time For Goal Achievement: 11/16/23 Potential to Achieve Goals: Fair Progress towards PT goals: Progressing toward goals    Frequency    Min 1X/week      PT Plan      Co-evaluation              AM-PAC PT 6 Clicks Mobility   Outcome Measure  Help needed turning from your back to your side while in a flat bed without using bedrails?: Total Help needed moving from lying on your back to sitting on the side of a flat bed without using bedrails?: Total Help needed moving to and from a bed to a chair (including a wheelchair)?: Total Help needed standing up from a chair using your arms (e.g., wheelchair or bedside chair)?: Total Help needed to walk in hospital room?: Total Help needed climbing 3-5 steps with a railing? : Total 6 Click Score: 6    End of Session Equipment Utilized During Treatment: Oxygen   Patient left: in bed;with call bell/phone within reach;with bed alarm set Nurse Communication: Mobility status PT Visit Diagnosis: Unsteadiness on feet (R26.81);Other abnormalities of gait and mobility (R26.89);Muscle weakness (generalized) (M62.81);Difficulty in walking, not elsewhere classified (R26.2);Hemiplegia and hemiparesis Hemiplegia - Right/Left: Right Hemiplegia - dominant/non-dominant: Dominant Hemiplegia -  caused by: Cerebral infarction     Time: 8494-8464 PT Time Calculation (min) (ACUTE ONLY): 30 min  Charges:    $Therapeutic Activity: 23-37 mins PT General Charges $$ ACUTE PT VISIT: 1 Visit                     Desmen Schoffstall H. Delores, PT, DPT, NCS 11/07/23, 3:49 PM [phone]

## 2023-11-07 NOTE — Progress Notes (Signed)
 Progress Note    MARCHELL FROMAN  FMW:[id number] DOB: 18-May-1956  DOA: 10/02/2023 PCP: [facility]      Brief Narrative:    Medical records reviewed and are as summarized below:  Michele House is a 68 y.o. female with medical history significant of COPD, chronic respiratory failure on 2 L, hypertension, type 2 diabetes, HFpEF, recent discharge from the hospital 24th November 2024 after hospitalization for COPD exacerbation complicated by respiratory failure.  She presented to the hospital again on 10/02/2023 with chest pain, malaise, cough, shortness of breath, abdominal pain and dysuria.  She was admitted to the hospital for COPD exacerbation, acute on chronic hypoxemic respiratory failure, UTI and pneumonia.  She became more confused in the hospital and MRI brain revealed acute stroke.        12/1: Vital stable, respiratory viral panel negative, procalcitonin negative, preliminary blood cultures negative.  Urine cultures are ordered as add-on, ammonia levels mildly elevated at 39, strep pneumo negative, CBG elevated at 244, history of enlarging pulmonary nodule with recommendations for PET/CT during most recent admission which has not been done yet, if video bronchoscopy was scheduled for 10/13/2023 as outpatient.  Unable to take care of himself and son cannot provide the required care as she required 24-hour supervision due to worsening dementia.  PT is recommending SNF.  12/2: Hemodynamically stable, now on baseline oxygen of 2 L.  Needs SNF placement. Urine cultures pending.  12/3: Blood pressure started trending up, restarting home Zestoretic .  Urine cultures with strep agalactiae, penicillin allergy noted, will continue with ceftriaxone  for now.  Also started on Remeron  for concern of worsening depression and unable to sleep at night  12/4.  Some very concerned about patient's mental status not being seen.  Patient receiving antibiotics already  and should have improved by now.  Will hold Lipitor.  Restart low-dose Zoloft .  Discontinue Remeron  and do Seroquel  at night. 12/5.  Patient has some blurry vision and weakness and pain.  MRI of the brain negative for acute intracranial abnormality. 12/6.  Patient stated she slept well.  Patient able to lift up her arms up off the bed today.  Patient thought she heard her son's voice outside the room. 12/7.  Patient sitting up in chair when I saw her.  Was able to feed herself breakfast.  Felt okay.  Did not offer any complaints. 12/8.  Patient more confused today than yesterday.  Able to follow some simple commands but not as good of a conversation today as yesterday. 12/9.  Patient still confused today.  Does not follow simple commands today. 12/10.  Patient more talkative today but was looking up at the ceiling when she was talking with me.  She does not move her extremities to command but does move them on her own.  Physical therapy and Occupational Therapy did not see her move her right arm. 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  Mild associated petechial hemorrhage.  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 12/12: Patient with new stroke, CTA head and neck with no large vessel occlusion.  Patient had a recent echocardiogram which was normal. CSF cultures remain negative.  Lipid panel with increased triglyceride at 347, HDL 29 and LDL of 38.  CBC with mild thrombocytopenia at 130, BMP mostly unremarkable.  Patient with significant right-sided weakness. Concern of paraneoplastic versus hypercoagulability secondary to malignancy.  Patient missed her appointment for bronchoscopy and outpatient appointment for PET scan due to recurrent hospitalizations. Involving pulmonary and oncology for their input. Send  out encephalitis panel still pending.  12/13: Patient continued to have waxing and waning mental status, unable to move right upper and lower extremities.  Having some hallucination.  Pulmonary is planning lung biopsy on 12/27 if she remains stable.  Rapidly progressive dementia and now with underlying stroke, question of paraneoplastic encephalitis and hypercoagulability secondary to underlying undiagnosed malignancy. Patient will also get benefit from neuropsych evaluation as outpatient.  12/14: Patient with some improved mentation today.  Trying to squeeze with right hand but still no movements of right lower extremity.  12/15: Patient with much improved mentation.  Having some flickering of movements on right upper and lower extremity today, left extremities weaker but seems improving.   12/16: Mental status seems stable, flaccid right upper and lower extremities.  Complaining of muscle spasms so Robaxin  was ordered.  Pulmonary is trying to do bronchoscopy and biopsy on Wednesday.  12/18: Pt underwent bronchoscopy and lung biopsy 12/19: patient stable , she is being optimized for dc for rehab.  She's cleared from pulmonary for discharge. Results from bronch will take 1 week.   12/26: Pt transferred to the stepdown unit with acute hypercapnic respiratory requiring Bipap but subsequently required mechanical intubation due to worsening respiratory failure.  Insulin  gtt initiated due to severe hyperglycemia.  With hemorrhagic shock due to bleeding duodenal ulcer, underwent emergent EGD with successful hemostasis by injection of Epinephrine  and hemostatic clip. 12/27: Hgb slowly trending down, no report of bleeding from OG suction nor melena. Weaning down vasopressors, if able to continue to wean vasopressors, then can consider WUA/SBT.  Giving temporizing measures for Hyperkalemia. Self extubated but required reintubation. Hgb continued to trend down, given 1 unit pRBC's and 2 FFP.  IR consulted for  consideration for embolization. 12/28: Weaned off Vasopressin , Levophed  weaning down.  S/p IR embolization of the Duodenal artery. Peak pressures and lung sounds improved with Ketamine , will keep intubated today given rapid reintubation yesterday. 12/30- patient on PRVC FiO2 28%, weaning off ketamine  and hoping to perform SBT today.  Holding feeds until post SBT.  11/02/23- patient is stable overnight no acute events, electrllytes improved.  No GI bleeds noted.  Opitimizing for TRH today.     Assessment/Plan:   Principal Problem:   Acute metabolic encephalopathy Active Problems:   CVA (cerebral vascular accident) (HCC)   Uncontrolled type 2 diabetes mellitus with hypoglycemia, without long-term current use of insulin  (HCC)   Hypotension   Acute on chronic respiratory failure with hypoxia (HCC)   Hypernatremia   COPD exacerbation (HCC)   Lung nodule   Pneumonia   UTI (urinary tract infection)   (HFpEF) heart failure with preserved ejection fraction (HCC)   Essential hypertension   Type 2 diabetes mellitus with obesity (HCC)   Melena   Gastrointestinal hemorrhage with melena   Duodenal ulcer    Body mass index is 27.64 kg/m.   Acute metabolic encephalopathy: Improved. Initially completed 5 days of high-dose several IV Solu-Medrol  on 10/12/2023 Differential diagnosis include autoimmune encephalitis   Acute stroke with right hemiplegia: Off of Plavix  because of recent acute GI bleeding.   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   Acute GI bleeding, bleeding duodenal ulcer: Continue Protonix . S/p EGD on 10/28/2023 with clip placed.  S/p IR embolization on 10/30/2023.     Hemorrhagic shock: Resolved Hypotension: Resolved.  Off of vasopressors   Acute blood loss anemia: H&H is  stable.  S/p transfusion with 2 units of PRBCs on 10/28/2023, 1 unit of PRBCs on 10/29/2023.  S/p transfusion with 2 units of platelet pheresis on 10/29/2023. H&H is stable.   Pneumonia: Initially completed 5 days of IV ceftriaxone  and azithromycin  on 10/07/2023. She was restarted on broad-spectrum antibiotics (vancomycin , ceftriaxone  and Flagyl ) on 10/28/2023 when she decompensated requiring transfer to the ICU. Acute UTI: Urine culture showed strep agalactiae.  Completed antibiotics.   Acute on chronic diastolic CHF: Stable.  S/p treatment with IV Lasix .  Continue oral Lasix  Anxiety/panic attacks: Ativan  as needed.  Continue sertraline    COPD exacerbation: This had previously resolved.  However, she had recurrent COPD exacerbation and was restarted on steroids. Continue prednisone  taper.   Acute on chronic hypoxemic respiratory failure, chronic hypercapnic respiratory failure: She was intubated on 10/28/2023 and extubated on 11/01/2023. She is tolerating 2 L/min oxygen via nasal cannula. She was strongly encouraged to use BiPAP every night to reduce risk of recurrent COPD exacerbation or worsening espiratory failure.   Type II DM with severe hyperglycemia: Continue Semglee  and NovoLog .  Monitor glucose levels and adjust insulin  as needed. S/p treatment with IV insulin  drip.   1.3 cm right upper lung spiculated nodule: S/p bronchoscopy on 10/20/2023.   Pathology was negative for malignancy and findings more consistent with reactive bronchial cells with acute inflammation.   General weakness: PT and OT recommended discharge to SNF.       Diet Order             Diet Carb Modified Fluid consistency: Thin  Diet effective now                            Consultants: Community Education Officer Interventional radiologist  Procedures: Lumbar puncture on 10/08/2023 Bronchoscopy on 10/20/2023 Intubation and mechanical ventilation  on 10/28/2023 EGD on 10/28/2023 IR embolization of duodenal artery on 10/30/2023    Medications:    arformoterol   15 mcg Nebulization BID   Chlorhexidine  Gluconate Cloth  6 each Topical Daily   docusate sodium   100 mg Oral BID   feeding supplement  237 mL Oral BID BM   furosemide   20 mg Oral Daily   insulin  aspart  0-5 Units Subcutaneous QHS   insulin  aspart  0-9 Units Subcutaneous TID WC   insulin  aspart  3 Units Subcutaneous TID WC   insulin  glargine-yfgn  10 Units Subcutaneous Daily   ipratropium-albuterol   3 mL Nebulization TID   levothyroxine   100 mcg Oral Q0600   multivitamin with minerals  1 tablet Oral Daily   mouth rinse  15 mL Mouth Rinse BID   pantoprazole   40 mg Oral BID   polyethylene glycol  17 g Oral Daily   [START ON 11/08/2023] predniSONE   25 mg Oral Q breakfast   Followed by   NOREEN ON 11/09/2023] predniSONE   20 mg Oral Q breakfast   Followed by   NOREEN ON 11/10/2023] predniSONE   15 mg Oral Q breakfast   Followed by   NOREEN ON 11/11/2023] predniSONE   10 mg Oral Q breakfast   rOPINIRole   4 mg Oral QHS   senna-docusate  1 tablet Oral Daily  sertraline   50 mg Oral Daily   Continuous Infusions:     Anti-infectives (From admission, onward)    Start     Dose/Rate Route Frequency Ordered Stop   10/31/23 2200  cefTRIAXone  (ROCEPHIN ) 1 g in sodium chloride  0.9 % 100 mL IVPB        1 g 200 mL/hr over 30 Minutes Intravenous Every 24 hours 10/31/23 1128 11/03/23 2216   10/30/23 2200  Vancomycin  (VANCOCIN ) 1,500 mg in sodium chloride  0.9 % 500 mL IVPB  Status:  Discontinued        1,500 mg 250 mL/hr over 120 Minutes Intravenous Every 48 hours 10/28/23 1956 10/29/23 0741   10/29/23 1400  metroNIDAZOLE  (FLAGYL ) IVPB 500 mg  Status:  Discontinued        500 mg 100 mL/hr over 60 Minutes Intravenous Every 12 hours 10/29/23 1304 10/31/23 1427   10/28/23 2200  ceFEPIme  (MAXIPIME ) 2 g in sodium chloride  0.9 % 100 mL IVPB  Status:  Discontinued        2 g 200 mL/hr over  30 Minutes Intravenous Every 12 hours 10/28/23 2003 10/31/23 1127   10/28/23 2045  vancomycin  (VANCOCIN ) 1,500 mg in sodium chloride  0.9 % 500 mL IVPB        1,500 mg 257.5 mL/hr over 120 Minutes Intravenous  Once 10/28/23 1954 10/28/23 2050   10/04/23 1000  azithromycin  (ZITHROMAX ) tablet 500 mg        500 mg Oral Daily 10/03/23 0751 10/06/23 0856   10/03/23 0600  cefTRIAXone  (ROCEPHIN ) 2 g in sodium chloride  0.9 % 100 mL IVPB  Status:  Discontinued        2 g 200 mL/hr over 30 Minutes Intravenous Every 24 hours 10/02/23 0740 10/07/23 0854   10/02/23 0745  cefTRIAXone  (ROCEPHIN ) 2 g in sodium chloride  0.9 % 100 mL IVPB  Status:  Discontinued        2 g 200 mL/hr over 30 Minutes Intravenous Every 24 hours 10/02/23 0738 10/02/23 0740   10/02/23 0745  azithromycin  (ZITHROMAX ) 500 mg in sodium chloride  0.9 % 250 mL IVPB        500 mg 250 mL/hr over 60 Minutes Intravenous Every 24 hours 10/02/23 0738 10/03/23 1236   10/02/23 0630  cefTRIAXone  (ROCEPHIN ) 2 g in sodium chloride  0.9 % 100 mL IVPB        2 g 200 mL/hr over 30 Minutes Intravenous  Once 10/02/23 0617 10/02/23 0714              Family Communication/Anticipated D/C date and plan/Code Status   DVT prophylaxis: SCDs Start: 10/02/23 9261     Code Status: Do not attempt resuscitation (DNR) PRE-ARREST INTERVENTIONS DESIRED  Family Communication: None Disposition Plan: Plan to discharge to SNF    Status is: Inpatient Remains inpatient appropriate because: Awaiting placement to SNF       Subjective:   Interval events noted.  She has no complaints.  She is in good spirits today.  She feels ready for discharge.  Objective:    Vitals:   11/06/23 2349 11/07/23 0438 11/07/23 0713 11/07/23 1146  BP: 137/79 139/66 (!) 141/68 135/72  Pulse: 91 86 82 90  Resp: 18  18 18   Temp: 97.7 F (36.5 C) 97.7 F (36.5 C) 98.3 F (36.8 C) 98.1 F (36.7 C)  TempSrc:  Oral  Oral  SpO2: 100% 96% 99% 100%  Weight:       Height:       No data found.  Intake/Output Summary (Last 24 hours) at 11/07/2023 1546 Last data filed at 11/06/2023 1945 Gross per 24 hour  Intake --  Output 300 ml  Net -300 ml    Filed Weights   11/03/23 0411 11/05/23 0621 11/06/23 0324  Weight: 66.3 kg 65.6 kg 64.2 kg    Exam:  GEN: NAD SKIN: Warm and dry EYES: No pallor or icterus ENT: MMM CV: RRR PULM: CTA B ABD: soft, ND, NT, +BS CNS: AAO x 3, right hemiparesis EXT: No edema or tenderness     Data Reviewed:   I have personally reviewed following labs and imaging studies:  Labs: Labs show the following:   Basic Metabolic Panel: Recent Labs  Lab 11/01/23 0453 11/02/23 0414 11/03/23 0401 11/04/23 0511 11/05/23 0438  NA 144 145 140 139 139  K 2.9* 3.4* 4.1 3.8 3.6  CL 108 105 99 97* 93*  CO2 30 34* 34* 33* 34*  GLUCOSE 104* 66* 90 112* 195*  BUN 24* 21 14 11 13   CREATININE 0.75 0.68 0.53 0.48 0.60  CALCIUM  7.9* 8.2* 8.1* 8.3* 8.9  MG 2.4 2.1 2.0 2.1 2.3  PHOS  --  1.8* 2.2* 2.8 2.1*   GFR Estimated Creatinine Clearance: 55.5 mL/min (by C-G formula based on SCr of 0.6 mg/dL). Liver Function Tests: No results for input(s): AST, ALT, ALKPHOS, BILITOT, PROT, ALBUMIN in the last 168 hours.  No results for input(s): LIPASE, AMYLASE in the last 168 hours. No results for input(s): AMMONIA in the last 168 hours.   Coagulation profile No results for input(s): INR, PROTIME in the last 168 hours.   CBC: Recent Labs  Lab 11/01/23 0453 11/02/23 0414 11/03/23 0401 11/04/23 0511 11/05/23 0438  WBC 7.1 5.2 4.5 5.1 7.5  HGB 7.9* 8.1* 8.3* 8.9* 10.6*  HCT 25.2* 25.4* 26.4* 28.0* 32.1*  MCV 96.6 94.4 96.4 96.6 91.2  PLT 314 243 219 208 268   Cardiac Enzymes: No results for input(s): CKTOTAL, CKMB, CKMBINDEX, TROPONINI in the last 168 hours. BNP (last 3 results) No results for input(s): PROBNP in the last 8760 hours. CBG: Recent Labs  Lab 11/06/23 2135  11/06/23 2334 11/07/23 0441 11/07/23 0715 11/07/23 1148  GLUCAP 319* 271* 115* 116* 228*   D-Dimer: No results for input(s): DDIMER in the last 72 hours. Hgb A1c: No results for input(s): HGBA1C in the last 72 hours. Lipid Profile: No results for input(s): CHOL, HDL, LDLCALC, TRIG, CHOLHDL, LDLDIRECT in the last 72 hours.  Thyroid  function studies: No results for input(s): TSH, T4TOTAL, T3FREE, THYROIDAB in the last 72 hours.  Invalid input(s): FREET3 Anemia work up: No results for input(s): VITAMINB12, FOLATE, FERRITIN, TIBC, IRON , RETICCTPCT in the last 72 hours. Sepsis Labs: Recent Labs  Lab 11/02/23 0414 11/03/23 0401 11/04/23 0511 11/05/23 0438  WBC 5.2 4.5 5.1 7.5    Microbiology Recent Results (from the past 240 hours)  MRSA Next Gen by PCR, Nasal     Status: None   Collection Time: 10/28/23  4:58 PM   Specimen: Nasal Mucosa; Nasal Swab  Result Value Ref Range Status   MRSA by PCR Next Gen NOT DETECTED NOT DETECTED Final    Comment: (NOTE) The GeneXpert MRSA Assay (FDA approved for NASAL specimens only), is one component of a comprehensive MRSA colonization surveillance program. It is not intended to diagnose MRSA infection nor to guide or monitor treatment for MRSA infections. Test performance is not FDA approved in patients less than 74 years old. Performed at [facility], [phone]  [address] [place], [place] [address]   Culture, Respiratory w Gram Stain     Status: None   Collection Time: 10/28/23  6:41 PM   Specimen: Tracheal Aspirate; Respiratory  Result Value Ref Range Status   Specimen Description   Final    TRACHEAL ASPIRATE Performed at [facility], [address], [place], [place] [address]    Special Requests   Final    NONE Performed at [facility], [address] [place], [place] [address]    Gram Stain   Final    RARE WBC PRESENT, PREDOMINANTLY PMN NO ORGANISMS SEEN     Culture   Final    RARE Normal respiratory flora-no Staph aureus or Pseudomonas seen Performed at [facility], [address] [place], [place] [address]    Report Status 10/31/2023 FINAL  Final    Procedures and diagnostic studies:  No results found.              LOS: 36 days   Anaaya Fuster  [employer] Adjuster on [email or web address]. If 7PM-7AM, please contact night-coverage at [email or web address]     11/07/2023, 3:46 PM

## 2023-11-07 NOTE — TOC Progression Note (Signed)
 Transition of Care [facility]) - Progression Note    Patient Details  Name: Michele House MRN: [id number] Date of Birth: Sep 12, 1956  Transition of Care [facility]) CM/SW Contact  Clotilda Brenner, [place] Phone Number: 11/07/2023, 2:52 PM  Clinical Narrative:   SNF authorization started in [place]. CSW requested updated PT notes.     Expected Discharge Plan: Skilled Nursing Facility Barriers to Discharge: Continued Medical Work up  Expected Discharge Plan and Services                                               Social Determinants of Health (SDOH) Interventions SDOH Screenings   Food Insecurity: Patient Unable To Answer (10/03/2023)  Recent Concern: Food Insecurity - Food Insecurity Present (09/24/2023)  Housing: High Risk (10/03/2023)  Transportation Needs: Patient Unable To Answer (10/03/2023)  Recent Concern: Transportation Needs - Unmet Transportation Needs (09/24/2023)  Utilities: Patient Unable To Answer (10/03/2023)  Social Connections: Patient Declined (11/02/2023)  Tobacco Use: Medium Risk (10/20/2023)    Readmission Risk Interventions     No data to display

## 2023-11-07 NOTE — Progress Notes (Signed)
 NAME:  Michele House, MRN:  [id number], DOB:  11-25-1955, LOS: 36 ADMISSION DATE:  10/02/2023   History of Present Illness:  Case of a 68 year old female patient with a past medical history of COPD with chronic respiratory failure on 2 L nasal cannula, hypertension, type 2 diabetes, heart failure with preserved EF who has been here for a month.  Initially presented with altered mental status encephalopathy.  Had an extensive workup including brain MRI which was negative for any acute intracranial abnormality initially.  However unfortunately course complicated by acute CVA with repeat MRI showing multiple acute infarcts in the high bilateral posterior frontal and parietal lobes.  She recovered from that but course further complicated by waxing and waning mental status which prompted an LP to rule out encephalitis and has been unremarkable so far.  She was found to have right upper lobe lung nodule which was concerning for malignancy and underwent robotic assisted navigational bronchoscopy with biopsy on 12/27 which showed very rare atypical cells and reactive alveolar cells mostly.    Pertinent  Medical History  As abnove  Significant Hospital Events: Including procedures, antibiotic start and stop dates in addition to other pertinent events   12/01: Vital stable, respiratory viral panel negative, procalcitonin negative, preliminary blood cultures negative.  Urine cultures are ordered as add-on, ammonia levels mildly elevated at 39, strep pneumo negative, CBG elevated at 244, history of enlarging pulmonary nodule with recommendations for PET/CT during most recent admission which has not been done yet, if video bronchoscopy was scheduled for 10/13/2023 as outpatient. 12/02: Unable to take care of herself and son cannot provide the required care as she required 24-hour supervision due to worsening dementia.  PT is recommending SNF. 12/03: Blood pressure started trending up, restarting home  Zestoretic .  Urine cultures with strep agalactiae, penicillin allergy noted, will continue with ceftriaxone  for now.  Also started on Remeron  for concern of worsening depression and unable to sleep at night  12/05:  Patient has some blurry vision and weakness and pain.  MRI of the brain negative for acute intracranial abnormality. 12/08:  Patient more confused today than yesterday.  Able to follow some simple commands but not as good of a conversation today as yesterday. 12/09:  Patient still confused today.  Does not follow simple commands today. 12/10: Patient more talkative today but was looking up at the ceiling when she was talking with me.  She does not move her extremities to command but does move them on her own.  Physical therapy and Occupational Therapy did not see her move her right arm. 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  Mild associated petechial hemorrhage.  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 12/12: Patient with new stroke, CTA head and neck with no large vessel occlusion.  Patient had a recent echocardiogram which was normal. Concern of paraneoplastic versus hypercoagulability secondary to malignancy.  Patient missed her appointment for bronchoscopy and outpatient appointment for PET scan due to recurrent hospitalizations. 12/13: Patient continued to have waxing and waning mental status, unable to move right upper and lower extremities.  Having some hallucination.  Pulmonary is planning lung biopsy on 12/27 if she remains stable.  Rapidly progressive dementia and now with underlying stroke, question of paraneoplastic encephalitis and hypercoagulability secondary to underlying undiagnosed malignancy. 12/15: Patient with much  improved mentation.  Having some flickering of movements on right  upper and lower extremity today, left extremities weaker but seems improving.   12/18: Pt underwent bronchoscopy and lung biopsy 12/19: patient stable , she is being optimized for dc for rehab.  She's cleared from pulmonary for discharge. Results from bronch will take 1 week.   12/26: Pt transferred to the stepdown unit with acute hypercapnic respiratory requiring Bipap but subsequently required mechanical intubation due to worsening respiratory failure.  Insulin  gtt initiated due to severe hyperglycemia.  With hemorrhagic shock due to bleeding duodenal ulcer, underwent emergent EGD with successful hemostasis by injection of Epinephrine  and hemostatic clip. 12/27: Hgb slowly trending down, no report of bleeding from OG suction nor melena. Weaning down vasopressors, if able to continue to wean vasopressors, then can consider WUA/SBT.  Giving temporizing measures for Hyperkalemia. Self extubated but required reintubation. Hgb continued to trend down, given 1 unit pRBC's and 2 FFP.  IR consulted for consideration for embolization. 12/28: Weaned off Vasopressin , Levophed  weaning down.  S/p IR embolization of the Duodenal artery. Peak pressures and lung sounds improved with Ketamine , will keep intubated today given rapid reintubation yesterday. 12/30- patient on PRVC FiO2 28%, weaning off ketamine  and hoping to perform SBT today.  Holding feeds until post SBT.  11/02/23- patient is stable overnight no acute events, electrllytes improved.  No GI bleeds noted.  Opitimizing for TRH today.  11/03/23 patient is more interactive this am, still has not been able to get OOB.  Will continue to follow her from pulmonary with COPD and lung cancer. Her pathology was sent for second opinion due to mixed reporting from cytotech and pathology report.  11/03/22- patient is good spirits this am. Son came to visit yesterday and we reivewed medical plan. H/h is stable. Following peripherally from pulmonary perspective for COPD and  lung mass.  11/05/23- patient on 3L/min.  She still has 9 day old Right internal jugular central line.  I have placed orders for RN to remove this line today.  She required BIPAP yesterday but states she did not feel SOB>  11/06/23- patient shares she feels well today.  She is on 3L/min but takes it off at times. She reports breathing feels improved 11/07/23- patient with no acute events overnight.  H/h is improved   Objective   Blood pressure (!) 141/68, pulse 82, temperature 98.3 F (36.8 C), resp. rate 18, height 5' (1.524 m), weight 64.2 kg, last menstrual period 12/24/1998, SpO2 99%.        Intake/Output Summary (Last 24 hours) at 11/07/2023 0857 Last data filed at 11/06/2023 1945 Gross per 24 hour  Intake --  Output 300 ml  Net -300 ml   Filed Weights   11/03/23 0411 11/05/23 0621 11/06/23 0324  Weight: 66.3 kg 65.6 kg 64.2 kg    Examination: General: Awake, follows verbal communication HENT: Supple neck reactive pupils  Lungs: mild wheezing b/l Cardiovascular: Normal S1, Normal S2, RRR  Abdomen: Soft, non tender, non distended, +BS  Extremities: Warm and well perfused no edema.   Labs and imaging were reviewed.   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  This has been sent for second opinion to be reviewed.  I think its most likely primary lung cancer.    Best Practice (right click and Reselect all SmartList Selections daily)   Diet/type: NPO DVT  prophylaxis SCD Pressure ulcer(s): N/A GI prophylaxis: PPI Lines: Central line Foley:  Yes, and it is still needed Code Status:  full code     Ekansh Sherk, M.D.  Pulmonary & Critical Care Medicine

## 2023-11-08 DIAGNOSIS — I639 Cerebral infarction, unspecified: Secondary | ICD-10-CM | POA: Diagnosis not present

## 2023-11-08 LAB — GLUCOSE, CAPILLARY
Glucose-Capillary: 170 mg/dL — ABNORMAL HIGH (ref 70–99)
Glucose-Capillary: 276 mg/dL — ABNORMAL HIGH (ref 70–99)
Glucose-Capillary: 127 mg/dL — ABNORMAL HIGH (ref 70–99)
Glucose-Capillary: 99 mg/dL (ref 70–99)

## 2023-11-08 NOTE — Inpatient Diabetes Management (Signed)
 Inpatient Diabetes Program Recommendations  AACE/ADA: New Consensus Statement on Inpatient Glycemic Control (2025)  Target Ranges:  Prepandial:   less than 140 mg/dL      Peak postprandial:   less than 180 mg/dL (1-2 hours)      Critically ill patients:  140 - 180 mg/dL   Lab Results  Component Value Date   GLUCAP 170 (H) 11/08/2023   HGBA1C 8.4 (H) 10/15/2023    Review of Glycemic Control  Latest Reference Range & Units 11/07/23 07:15 11/07/23 11:48 11/07/23 17:31 11/07/23 20:48 11/08/23 08:05  Glucose-Capillary 70 - 99 mg/dL 883 (H) 771 (H) 646 (H) 257 (H) 170 (H)   Diabetes history: DM 2 Outpatient Diabetes medications:  Glipizide  10 mg with breakfast Tradjenta  5 mg daily Current orders for Inpatient glycemic control:  Novolog  0-9 units tid with meals and HS Novolog  3 units tid with meals (hold if patient eats less than 50%) Semglee  10 units daily Ensure Enlive (40 gram of carbohydrate) bid between meals   Prednisone  taper Inpatient Diabetes Program Recommendations:    -    increase Novolog  6 units tid with meals (hold if patient eats less than 50% or NPO).   Thanks,  Clotilda Bull RN, MSN, BC-ADM Inpatient Diabetes Coordinator Team Pager [phone] (8a-5p)

## 2023-11-08 NOTE — Progress Notes (Signed)
 Progress Note    Michele House  FMW:[id number] DOB: 08-12-1956  DOA: 10/02/2023 PCP: [facility]      Brief Narrative:    Medical records reviewed and are as summarized below:  Michele House is a 68 y.o. female with medical history significant of COPD, chronic respiratory failure on 2 L, hypertension, type 2 diabetes, HFpEF, recent discharge from the hospital 24th November 2024 after hospitalization for COPD exacerbation complicated by respiratory failure.  She presented to the hospital again on 10/02/2023 with chest pain, malaise, cough, shortness of breath, abdominal pain and dysuria.  She was admitted to the hospital for COPD exacerbation, acute on chronic hypoxemic respiratory failure, UTI and pneumonia.  She became more confused in the hospital and MRI brain revealed acute stroke.        12/1: Vital stable, respiratory viral panel negative, procalcitonin negative, preliminary blood cultures negative.  Urine cultures are ordered as add-on, ammonia levels mildly elevated at 39, strep pneumo negative, CBG elevated at 244, history of enlarging pulmonary nodule with recommendations for PET/CT during most recent admission which has not been done yet, if video bronchoscopy was scheduled for 10/13/2023 as outpatient.  Unable to take care of himself and son cannot provide the required care as she required 24-hour supervision due to worsening dementia.  PT is recommending SNF.  12/2: Hemodynamically stable, now on baseline oxygen of 2 L.  Needs SNF placement. Urine cultures pending.  12/3: Blood pressure started trending up, restarting home Zestoretic .  Urine cultures with strep agalactiae, penicillin allergy noted, will continue with ceftriaxone  for now.  Also started on Remeron  for concern of worsening depression and unable to sleep at night  12/4.  Some very concerned about patient's mental status not being seen.  Patient receiving antibiotics already  and should have improved by now.  Will hold Lipitor.  Restart low-dose Zoloft .  Discontinue Remeron  and do Seroquel  at night. 12/5.  Patient has some blurry vision and weakness and pain.  MRI of the brain negative for acute intracranial abnormality. 12/6.  Patient stated she slept well.  Patient able to lift up her arms up off the bed today.  Patient thought she heard her son's voice outside the room. 12/7.  Patient sitting up in chair when I saw her.  Was able to feed herself breakfast.  Felt okay.  Did not offer any complaints. 12/8.  Patient more confused today than yesterday.  Able to follow some simple commands but not as good of a conversation today as yesterday. 12/9.  Patient still confused today.  Does not follow simple commands today. 12/10.  Patient more talkative today but was looking up at the ceiling when she was talking with me.  She does not move her extremities to command but does move them on her own.  Physical therapy and Occupational Therapy did not see her move her right arm. 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  Mild associated petechial hemorrhage.  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 12/12: Patient with new stroke, CTA head and neck with no large vessel occlusion.  Patient had a recent echocardiogram which was normal. CSF cultures remain negative.  Lipid panel with increased triglyceride at 347, HDL 29 and LDL of 38.  CBC with mild thrombocytopenia at 130, BMP mostly unremarkable.  Patient with significant right-sided weakness. Concern of paraneoplastic versus hypercoagulability secondary to malignancy.  Patient missed her appointment for bronchoscopy and outpatient appointment for PET scan due to recurrent hospitalizations. Involving pulmonary and oncology for their input. Send  out encephalitis panel still pending.  12/13: Patient continued to have waxing and waning mental status, unable to move right upper and lower extremities.  Having some hallucination.  Pulmonary is planning lung biopsy on 12/27 if she remains stable.  Rapidly progressive dementia and now with underlying stroke, question of paraneoplastic encephalitis and hypercoagulability secondary to underlying undiagnosed malignancy. Patient will also get benefit from neuropsych evaluation as outpatient.  12/14: Patient with some improved mentation today.  Trying to squeeze with right hand but still no movements of right lower extremity.  12/15: Patient with much improved mentation.  Having some flickering of movements on right upper and lower extremity today, left extremities weaker but seems improving.   12/16: Mental status seems stable, flaccid right upper and lower extremities.  Complaining of muscle spasms so Robaxin  was ordered.  Pulmonary is trying to do bronchoscopy and biopsy on Wednesday.  12/18: Pt underwent bronchoscopy and lung biopsy 12/19: patient stable , she is being optimized for dc for rehab.  She's cleared from pulmonary for discharge. Results from bronch will take 1 week.   12/26: Pt transferred to the stepdown unit with acute hypercapnic respiratory requiring Bipap but subsequently required mechanical intubation due to worsening respiratory failure.  Insulin  gtt initiated due to severe hyperglycemia.  With hemorrhagic shock due to bleeding duodenal ulcer, underwent emergent EGD with successful hemostasis by injection of Epinephrine  and hemostatic clip. 12/27: Hgb slowly trending down, no report of bleeding from OG suction nor melena. Weaning down vasopressors, if able to continue to wean vasopressors, then can consider WUA/SBT.  Giving temporizing measures for Hyperkalemia. Self extubated but required reintubation. Hgb continued to trend down, given 1 unit pRBC's and 2 FFP.  IR consulted for  consideration for embolization. 12/28: Weaned off Vasopressin , Levophed  weaning down.  S/p IR embolization of the Duodenal artery. Peak pressures and lung sounds improved with Ketamine , will keep intubated today given rapid reintubation yesterday. 12/30- patient on PRVC FiO2 28%, weaning off ketamine  and hoping to perform SBT today.  Holding feeds until post SBT.  11/02/23- patient is stable overnight no acute events, electrllytes improved.  No GI bleeds noted.  Opitimizing for TRH today.     Assessment/Plan:   Principal Problem:   Acute metabolic encephalopathy Active Problems:   CVA (cerebral vascular accident) (HCC)   Uncontrolled type 2 diabetes mellitus with hypoglycemia, without long-term current use of insulin  (HCC)   Hypotension   Acute on chronic respiratory failure with hypoxia (HCC)   Hypernatremia   COPD exacerbation (HCC)   Lung nodule   Pneumonia   UTI (urinary tract infection)   (HFpEF) heart failure with preserved ejection fraction (HCC)   Essential hypertension   Type 2 diabetes mellitus with obesity (HCC)   Melena   Gastrointestinal hemorrhage with melena   Duodenal ulcer    Body mass index is 28.46 kg/m.   Acute metabolic encephalopathy: Improved. Initially completed 5 days of high-dose several IV Solu-Medrol  on 10/12/2023 Differential diagnosis include autoimmune encephalitis   Acute stroke with right hemiplegia: Off of Plavix  because of recent acute GI bleeding.   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   Acute GI bleeding, bleeding duodenal ulcer: Continue Protonix . S/p EGD on 10/28/2023 with clip placed.  S/p IR embolization on 10/30/2023.     Hemorrhagic shock: Resolved Hypotension: Resolved.  Off of vasopressors   Acute blood loss anemia: H&H is  stable.  S/p transfusion with 2 units of PRBCs on 10/28/2023, 1 unit of PRBCs on 10/29/2023.  S/p transfusion with 2 units of platelet pheresis on 10/29/2023. H&H is stable.   Pneumonia: Initially completed 5 days of IV ceftriaxone  and azithromycin  on 10/07/2023. She was restarted on broad-spectrum antibiotics (vancomycin , ceftriaxone  and Flagyl ) on 10/28/2023 when she decompensated requiring transfer to the ICU. Acute UTI: Urine culture showed strep agalactiae.  Completed antibiotics.   Acute on chronic diastolic CHF: Stable.  S/p treatment with IV Lasix .  Continue oral Lasix  Anxiety/panic attacks: Ativan  as needed.  Continue sertraline    COPD exacerbation: This had previously resolved.  However, she had recurrent COPD exacerbation and was restarted on steroids. Continue prednisone  taper through pulmonary 2025.   Acute on chronic hypoxemic respiratory failure, chronic hypercapnic respiratory failure: She was intubated on 10/28/2023 and extubated on 11/01/2023. She is tolerating 2 L/min oxygen via nasal cannula. She was strongly encouraged to use BiPAP every night to reduce risk of recurrent COPD exacerbation or worsening espiratory failure.   Type II DM with severe hyperglycemia: Continue Semglee  and NovoLog .  Monitor glucose levels and adjust insulin  as needed. S/p treatment with IV insulin  drip. Suspect prednisone  is contributing to hyperglycemia.   1.3 cm right upper lung spiculated nodule: S/p bronchoscopy on 10/20/2023.   Pathology was negative for malignancy and findings more consistent with reactive bronchial cells with acute inflammation.   General weakness: PT and OT recommended discharge to SNF.   I met his son, Alm, in the hallway, and he said he was happy with his mother's progress.    Awaiting placement to SNF.    Diet Order             Diet Carb Modified Fluid consistency: Thin  Diet effective now                             Consultants: Community Education Officer Interventional radiologist  Procedures: Lumbar puncture on 10/08/2023 Bronchoscopy on 10/20/2023 Intubation and mechanical ventilation on 10/28/2023 EGD on 10/28/2023 IR embolization of duodenal artery on 10/30/2023    Medications:    arformoterol   15 mcg Nebulization BID   Chlorhexidine  Gluconate Cloth  6 each Topical Daily   docusate sodium   100 mg Oral BID   feeding supplement  237 mL Oral BID BM   furosemide   20 mg Oral Daily   insulin  aspart  0-5 Units Subcutaneous QHS   insulin  aspart  0-9 Units Subcutaneous TID WC   insulin  aspart  3 Units Subcutaneous TID WC   insulin  glargine-yfgn  10 Units Subcutaneous Daily   ipratropium-albuterol   3 mL Nebulization TID   levothyroxine   100 mcg Oral Q0600   multivitamin with minerals  1 tablet Oral Daily   mouth rinse  15 mL Mouth Rinse BID   pantoprazole   40 mg Oral BID   polyethylene glycol  17 g Oral Daily   [START ON 11/09/2023] predniSONE   20 mg Oral Q breakfast   Followed by   NOREEN ON 11/10/2023] predniSONE   15 mg Oral Q breakfast   Followed by   NOREEN ON 11/11/2023] predniSONE   10 mg Oral Q breakfast  rOPINIRole   4 mg Oral QHS   senna-docusate  1 tablet Oral Daily   sertraline   50 mg Oral Daily   Continuous Infusions:     Anti-infectives (From admission, onward)    Start     Dose/Rate Route Frequency Ordered Stop   10/31/23 2200  cefTRIAXone  (ROCEPHIN ) 1 g in sodium chloride  0.9 % 100 mL IVPB        1 g 200 mL/hr over 30 Minutes Intravenous Every 24 hours 10/31/23 1128 11/03/23 2216   10/30/23 2200  Vancomycin  (VANCOCIN ) 1,500 mg in sodium chloride  0.9 % 500 mL IVPB  Status:  Discontinued        1,500 mg 250 mL/hr over 120 Minutes Intravenous Every 48 hours 10/28/23 1956 10/29/23 0741   10/29/23 1400  metroNIDAZOLE  (FLAGYL ) IVPB 500 mg  Status:  Discontinued        500 mg 100 mL/hr over 60 Minutes Intravenous Every 12  hours 10/29/23 1304 10/31/23 1427   10/28/23 2200  ceFEPIme  (MAXIPIME ) 2 g in sodium chloride  0.9 % 100 mL IVPB  Status:  Discontinued        2 g 200 mL/hr over 30 Minutes Intravenous Every 12 hours 10/28/23 2003 10/31/23 1127   10/28/23 2045  vancomycin  (VANCOCIN ) 1,500 mg in sodium chloride  0.9 % 500 mL IVPB        1,500 mg 257.5 mL/hr over 120 Minutes Intravenous  Once 10/28/23 1954 10/28/23 2050   10/04/23 1000  azithromycin  (ZITHROMAX ) tablet 500 mg        500 mg Oral Daily 10/03/23 0751 10/06/23 0856   10/03/23 0600  cefTRIAXone  (ROCEPHIN ) 2 g in sodium chloride  0.9 % 100 mL IVPB  Status:  Discontinued        2 g 200 mL/hr over 30 Minutes Intravenous Every 24 hours 10/02/23 0740 10/07/23 0854   10/02/23 0745  cefTRIAXone  (ROCEPHIN ) 2 g in sodium chloride  0.9 % 100 mL IVPB  Status:  Discontinued        2 g 200 mL/hr over 30 Minutes Intravenous Every 24 hours 10/02/23 0738 10/02/23 0740   10/02/23 0745  azithromycin  (ZITHROMAX ) 500 mg in sodium chloride  0.9 % 250 mL IVPB        500 mg 250 mL/hr over 60 Minutes Intravenous Every 24 hours 10/02/23 0738 10/03/23 1236   10/02/23 0630  cefTRIAXone  (ROCEPHIN ) 2 g in sodium chloride  0.9 % 100 mL IVPB        2 g 200 mL/hr over 30 Minutes Intravenous  Once 10/02/23 0617 10/02/23 0714              Family Communication/Anticipated D/C date and plan/Code Status   DVT prophylaxis: SCDs Start: 10/02/23 9261     Code Status: Do not attempt resuscitation (DNR) PRE-ARREST INTERVENTIONS DESIRED  Family Communication: None Disposition Plan: Plan to discharge to SNF    Status is: Inpatient Remains inpatient appropriate because: Awaiting placement to SNF       Subjective:   No acute events overnight.  He has no complaints.  She still refuses to use BiPAP at night.  Brooke, CHARITY FUNDRAISER, was at the bedside.  Objective:    Vitals:   11/07/23 2046 11/08/23 0500 11/08/23 0803 11/08/23 1158  BP: 123/68  139/80 120/67  Pulse: 96  (!) 105  98  Resp:   18 18  Temp: 97.8 F (36.6 C)  98 F (36.7 C) 98.1 F (36.7 C)  TempSrc:   Oral Oral  SpO2: 100%  97% 97%  Weight:  66.1 kg    Height:       No data found.   Intake/Output Summary (Last 24 hours) at 11/08/2023 1516 Last data filed at 11/08/2023 1300 Gross per 24 hour  Intake 120 ml  Output 400 ml  Net -280 ml    Filed Weights   11/05/23 0621 11/06/23 0324 11/08/23 0500  Weight: 65.6 kg 64.2 kg 66.1 kg    Exam:  GEN: NAD SKIN: Warm and dry EYES: No pallor or icterus ENT: MMM CV: RRR PULM: CTA B ABD: soft, ND, NT, +BS CNS: AAO x 3, right hemiparesis EXT: No edema or tenderness    Data Reviewed:   I have personally reviewed following labs and imaging studies:  Labs: Labs show the following:   Basic Metabolic Panel: Recent Labs  Lab 11/02/23 0414 11/03/23 0401 11/04/23 0511 11/05/23 0438  NA 145 140 139 139  K 3.4* 4.1 3.8 3.6  CL 105 99 97* 93*  CO2 34* 34* 33* 34*  GLUCOSE 66* 90 112* 195*  BUN 21 14 11 13   CREATININE 0.68 0.53 0.48 0.60  CALCIUM  8.2* 8.1* 8.3* 8.9  MG 2.1 2.0 2.1 2.3  PHOS 1.8* 2.2* 2.8 2.1*   GFR Estimated Creatinine Clearance: 56.3 mL/min (by C-G formula based on SCr of 0.6 mg/dL). Liver Function Tests: No results for input(s): AST, ALT, ALKPHOS, BILITOT, PROT, ALBUMIN in the last 168 hours.  No results for input(s): LIPASE, AMYLASE in the last 168 hours. No results for input(s): AMMONIA in the last 168 hours.   Coagulation profile No results for input(s): INR, PROTIME in the last 168 hours.   CBC: Recent Labs  Lab 11/02/23 0414 11/03/23 0401 11/04/23 0511 11/05/23 0438  WBC 5.2 4.5 5.1 7.5  HGB 8.1* 8.3* 8.9* 10.6*  HCT 25.4* 26.4* 28.0* 32.1*  MCV 94.4 96.4 96.6 91.2  PLT 243 219 208 268   Cardiac Enzymes: No results for input(s): CKTOTAL, CKMB, CKMBINDEX, TROPONINI in the last 168 hours. BNP (last 3 results) No results for input(s): PROBNP in the last 8760  hours. CBG: Recent Labs  Lab 11/07/23 1148 11/07/23 1731 11/07/23 2048 11/08/23 0805 11/08/23 1200  GLUCAP 228* 353* 257* 170* 276*   D-Dimer: No results for input(s): DDIMER in the last 72 hours. Hgb A1c: No results for input(s): HGBA1C in the last 72 hours. Lipid Profile: No results for input(s): CHOL, HDL, LDLCALC, TRIG, CHOLHDL, LDLDIRECT in the last 72 hours.  Thyroid  function studies: No results for input(s): TSH, T4TOTAL, T3FREE, THYROIDAB in the last 72 hours.  Invalid input(s): FREET3 Anemia work up: No results for input(s): VITAMINB12, FOLATE, FERRITIN, TIBC, IRON , RETICCTPCT in the last 72 hours. Sepsis Labs: Recent Labs  Lab 11/02/23 0414 11/03/23 0401 11/04/23 0511 11/05/23 0438  WBC 5.2 4.5 5.1 7.5    Microbiology No results found for this or any previous visit (from the past 240 hours).   Procedures and diagnostic studies:  No results found.              LOS: 37 days   Brewer Hitchman  [employer] on [email or web address]. If 7PM-7AM, please contact night-coverage at [email or web address]     11/08/2023, 3:16 PM

## 2023-11-08 NOTE — Progress Notes (Signed)
 NAME:  Michele House, MRN:  [id number], DOB:  [birth year], LOS: 37 ADMISSION DATE:  10/02/2023   History of Present Illness:  Case of a 67 year old female patient with a past medical history of COPD with chronic respiratory failure on 2 L nasal cannula, hypertension, type 2 diabetes, heart failure with preserved EF who has been here for a month.  Initially presented with altered mental status encephalopathy.  Had an extensive workup including brain MRI which was negative for any acute intracranial abnormality initially.  However unfortunately course complicated by acute CVA with repeat MRI showing multiple acute infarcts in the high bilateral posterior frontal and parietal lobes.  She recovered from that but course further complicated by waxing and waning mental status which prompted an LP to rule out encephalitis and has been unremarkable so far.  She was found to have right upper lobe lung nodule which was concerning for malignancy and underwent robotic assisted navigational bronchoscopy with biopsy on 12/27 which showed very rare atypical cells and reactive alveolar cells mostly.    Pertinent  Medical History  As abnove  Significant Hospital Events: Including procedures, antibiotic start and stop dates in addition to other pertinent events   12/01: Vital stable, respiratory viral panel negative, procalcitonin negative, preliminary blood cultures negative.  Urine cultures are ordered as add-on, ammonia levels mildly elevated at 39, strep pneumo negative, CBG elevated at 244, history of enlarging pulmonary nodule with recommendations for PET/CT during most recent admission which has not been done yet, if video bronchoscopy was scheduled for 10/13/2023 as outpatient. 12/02: Unable to take care of herself and son cannot provide the required care as she required 24-hour supervision due to worsening dementia.  PT is recommending SNF. 12/03: Blood pressure started trending up, restarting home  Zestoretic .  Urine cultures with strep agalactiae, penicillin allergy noted, will continue with ceftriaxone  for now.  Also started on Remeron  for concern of worsening depression and unable to sleep at night  12/05:  Patient has some blurry vision and weakness and pain.  MRI of the brain negative for acute intracranial abnormality. 12/08:  Patient more confused today than yesterday.  Able to follow some simple commands but not as good of a conversation today as yesterday. 12/09:  Patient still confused today.  Does not follow simple commands today. 12/10: Patient more talkative today but was looking up at the ceiling when she was talking with me.  She does not move her extremities to command but does move them on her own.  Physical therapy and Occupational Therapy did not see her move her right arm. 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  Mild associated petechial hemorrhage.  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 12/12: Patient with new stroke, CTA head and neck with no large vessel occlusion.  Patient had a recent echocardiogram which was normal. Concern of paraneoplastic versus hypercoagulability secondary to malignancy.  Patient missed her appointment for bronchoscopy and outpatient appointment for PET scan due to recurrent hospitalizations. 12/13: Patient continued to have waxing and waning mental status, unable to move right upper and lower extremities.  Having some hallucination.  Pulmonary is planning lung biopsy on 12/27 if she remains stable.  Rapidly progressive dementia and now with underlying stroke, question of paraneoplastic encephalitis and hypercoagulability secondary to underlying undiagnosed malignancy. 12/15: Patient with much  improved mentation.  Having some flickering of movements on right  upper and lower extremity today, left extremities weaker but seems improving.   12/18: Pt underwent bronchoscopy and lung biopsy 12/19: patient stable , she is being optimized for dc for rehab.  She's cleared from pulmonary for discharge. Results from bronch will take 1 week.   12/26: Pt transferred to the stepdown unit with acute hypercapnic respiratory requiring Bipap but subsequently required mechanical intubation due to worsening respiratory failure.  Insulin  gtt initiated due to severe hyperglycemia.  With hemorrhagic shock due to bleeding duodenal ulcer, underwent emergent EGD with successful hemostasis by injection of Epinephrine  and hemostatic clip. 12/27: Hgb slowly trending down, no report of bleeding from OG suction nor melena. Weaning down vasopressors, if able to continue to wean vasopressors, then can consider WUA/SBT.  Giving temporizing measures for Hyperkalemia. Self extubated but required reintubation. Hgb continued to trend down, given 1 unit pRBC's and 2 FFP.  IR consulted for consideration for embolization. 12/28: Weaned off Vasopressin , Levophed  weaning down.  S/p IR embolization of the Duodenal artery. Peak pressures and lung sounds improved with Ketamine , will keep intubated today given rapid reintubation yesterday. 12/30- patient on PRVC FiO2 28%, weaning off ketamine  and hoping to perform SBT today.  Holding feeds until post SBT.  11/02/23- patient is stable overnight no acute events, electrllytes improved.  No GI bleeds noted.  Opitimizing for TRH today.  11/03/23 patient is more interactive this am, still has not been able to get OOB.  Will continue to follow her from pulmonary with COPD and lung cancer. Her pathology was sent for second opinion due to mixed reporting from cytotech and pathology report.  11/03/22- patient is good spirits this am. Son came to visit yesterday and we reivewed medical plan. H/h is stable. Following peripherally from pulmonary perspective for COPD and  lung mass.  11/05/23- patient on 3L/min.  She still has 9 day old Right internal jugular central line.  I have placed orders for RN to remove this line today.  She required BIPAP yesterday but states she did not feel SOB>  11/06/23- patient shares she feels well today.  She is on 3L/min but takes it off at times. She reports breathing feels improved 11/07/23- patient with no acute events overnight.  H/h is improved 11/08/23- patient seen at bedside.  Sitting up in bed in no distress denies dyspnea.  She is on nasal canula 2L/min.  She asks if we can help her get glasses and dentures.  I received inconclusive results from lung biopsy. I discussed this with patient.  We discussed repeating the biopsy and patient states she wants this done as soon as possible. She is clinically stable.  Diagnosis Lung, biopsy, Right upper lobe - VERY RARE ATYPICAL CELLS, FAVOR REACTIVE. - FRAGMENTS OF ALVEOLATED LUNG AND BLOOD CLOT. Diagnosis Note Per [facility] recent imaging revealed an enlarging spiculated 1.6 cm right upper lung lesion. This case underwent intradepartmental consultation and Dr. Reed concurs with the interpretation. See concurrent case [id number]. Tara Objective   Blood pressure 108/66, pulse 94, temperature 97.7 F (36.5 C), temperature source Oral, resp. rate 18, height 5' (1.524 m), weight 66.1 kg, last menstrual period 12/24/1998, SpO2 93%.        Intake/Output Summary (Last 24 hours) at 11/08/2023 1617 Last data filed at 11/08/2023 1300 Gross per 24 hour  Intake 120 ml  Output 400 ml  Net -280 ml   Filed Weights   11/05/23 0621 11/06/23 0324 11/08/23 0500  Weight: 65.6 kg 64.2 kg 66.1 kg    Examination: General: Awake, follows verbal communication HENT: Supple neck reactive pupils  Lungs: mild wheezing b/l Cardiovascular: Normal S1, Normal S2, RRR  Abdomen: Soft, non tender, non distended, +BS  Extremities: Warm and well perfused no edema.   Labs and imaging were reviewed.    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  This has been sent for second opinion to be reviewed.  I think its most likely primary lung cancer.  Have discussed repeat lung biopsy and patient is agreeable to do this.    Best Practice (right click and Reselect all SmartList Selections daily)   Diet/type: NPO DVT prophylaxis SCD Pressure ulcer(s): N/A GI prophylaxis: PPI Lines: Central line Foley:  Yes, and it is still needed Code Status:  full code     Demetress Tift, M.D.  Pulmonary & Critical Care Medicine

## 2023-11-09 ENCOUNTER — Encounter
Admission: EM | Disposition: A | Discharge status: Skilled Nursing Facility | Payer: Self-pay | Source: Home / Self Care | Attending: Internal Medicine

## 2023-11-09 ENCOUNTER — Inpatient Hospital Stay: Payer: 59

## 2023-11-09 ENCOUNTER — Encounter: Payer: Self-pay | Admitting: Family Medicine

## 2023-11-09 DIAGNOSIS — I82401 Acute embolism and thrombosis of unspecified deep veins of right lower extremity: Secondary | ICD-10-CM | POA: Diagnosis not present

## 2023-11-09 DIAGNOSIS — I82409 Acute embolism and thrombosis of unspecified deep veins of unspecified lower extremity: Secondary | ICD-10-CM

## 2023-11-09 DIAGNOSIS — Z8719 Personal history of other diseases of the digestive system: Secondary | ICD-10-CM | POA: Diagnosis not present

## 2023-11-09 DIAGNOSIS — M79604 Pain in right leg: Secondary | ICD-10-CM

## 2023-11-09 DIAGNOSIS — I639 Cerebral infarction, unspecified: Secondary | ICD-10-CM | POA: Diagnosis not present

## 2023-11-09 DIAGNOSIS — M79605 Pain in left leg: Secondary | ICD-10-CM

## 2023-11-09 HISTORY — PX: IVC FILTER INSERTION: CATH118245

## 2023-11-09 LAB — GLUCOSE, CAPILLARY
Glucose-Capillary: 253 mg/dL — ABNORMAL HIGH (ref 70–99)
Glucose-Capillary: 108 mg/dL — ABNORMAL HIGH (ref 70–99)
Glucose-Capillary: 225 mg/dL — ABNORMAL HIGH (ref 70–99)
Glucose-Capillary: 48 mg/dL — ABNORMAL LOW (ref 70–99)
Glucose-Capillary: 284 mg/dL — ABNORMAL HIGH (ref 70–99)

## 2023-11-09 SURGERY — IVC FILTER INSERTION
Anesthesia: Moderate Sedation

## 2023-11-09 MED ORDER — DIPHENHYDRAMINE HCL 50 MG/ML IJ SOLN: 50.0000 mg | Freq: Once | INTRAMUSCULAR | Status: DC | PRN

## 2023-11-09 MED ORDER — MIDAZOLAM HCL 2 MG/2ML IJ SOLN
INTRAMUSCULAR | Status: DC | PRN
  Administered 2023-11-09: 1 mg via INTRAVENOUS

## 2023-11-09 MED ORDER — MIDAZOLAM HCL 2 MG/2ML IJ SOLN
INTRAMUSCULAR | Status: AC
Start: 2023-11-09 — End: ?
  Filled 2023-11-09: qty 2

## 2023-11-09 MED ORDER — INSULIN GLARGINE-YFGN 100 UNIT/ML ~~LOC~~ SOLN
8.0000 [IU] | Freq: Every day | SUBCUTANEOUS | Status: DC
  Administered 2023-11-10 – 2023-11-15 (×6): 8 [IU] via SUBCUTANEOUS
  Filled 2023-11-09 (×6): qty 0.08

## 2023-11-09 MED ORDER — FAMOTIDINE 20 MG PO TABS: 40.0000 mg | ORAL_TABLET | Freq: Once | ORAL | Status: DC | PRN

## 2023-11-09 MED ORDER — IPRATROPIUM-ALBUTEROL 0.5-2.5 (3) MG/3ML IN SOLN
3.0000 mL | Freq: Two times a day (BID) | RESPIRATORY_TRACT | Status: DC
  Administered 2023-11-09 – 2023-12-01 (×41): 3 mL via RESPIRATORY_TRACT
  Filled 2023-11-09 (×43): qty 3

## 2023-11-09 MED ORDER — CEFAZOLIN SODIUM-DEXTROSE 2-4 GM/100ML-% IV SOLN
2.0000 g | INTRAVENOUS | Status: AC
  Administered 2023-11-09: 2 g via INTRAVENOUS

## 2023-11-09 MED ORDER — DEXTROSE 50 % IV SOLN
25.0000 g | INTRAVENOUS | Status: AC
  Administered 2023-11-09: 25 g via INTRAVENOUS

## 2023-11-09 MED ORDER — HEPARIN SODIUM (PORCINE) 1000 UNIT/ML IJ SOLN
INTRAMUSCULAR | Status: AC
Start: 2023-11-09 — End: ?
  Filled 2023-11-09: qty 10

## 2023-11-09 MED ORDER — DEXTROSE 50 % IV SOLN
INTRAVENOUS | Status: AC
  Filled 2023-11-09: qty 50

## 2023-11-09 MED ORDER — IODIXANOL 320 MG/ML IV SOLN
INTRAVENOUS | Status: DC | PRN
  Administered 2023-11-09: 10 mL via INTRAVENOUS

## 2023-11-09 MED ORDER — MIDAZOLAM HCL 2 MG/ML PO SYRP
8.0000 mg | ORAL_SOLUTION | Freq: Once | ORAL | Status: DC | PRN
  Filled 2023-11-09: qty 5

## 2023-11-09 MED ORDER — MORPHINE SULFATE (PF) 2 MG/ML IV SOLN
2.0000 mg | Freq: Once | INTRAVENOUS | Status: DC
  Filled 2023-11-09: qty 1

## 2023-11-09 MED ORDER — LIDOCAINE-EPINEPHRINE (PF) 1 %-1:200000 IJ SOLN
INTRAMUSCULAR | Status: DC | PRN
  Administered 2023-11-09: 10 mL via INTRADERMAL

## 2023-11-09 MED ORDER — SODIUM CHLORIDE 0.9 % IV SOLN: INTRAVENOUS | Status: DC

## 2023-11-09 MED ORDER — FENTANYL CITRATE PF 50 MCG/ML IJ SOSY: 12.5000 ug | PREFILLED_SYRINGE | Freq: Once | INTRAMUSCULAR | Status: DC | PRN

## 2023-11-09 MED ORDER — HEPARIN (PORCINE) IN NACL 1000-0.9 UT/500ML-% IV SOLN
INTRAVENOUS | Status: DC | PRN
  Administered 2023-11-09: 500 mL

## 2023-11-09 MED ORDER — METHYLPREDNISOLONE SODIUM SUCC 125 MG IJ SOLR: 125.0000 mg | Freq: Once | INTRAMUSCULAR | Status: DC | PRN

## 2023-11-09 SURGICAL SUPPLY — 4 items
COVER PROBE ULTRASOUND 5X96 (MISCELLANEOUS) IMPLANT
KIT FEM OPTION ELITE FILTER (Filter) IMPLANT
PACK ANGIOGRAPHY (CUSTOM PROCEDURE TRAY) ×1 IMPLANT
WIRE SUPRACORE 190CM (WIRE) IMPLANT

## 2023-11-09 NOTE — Progress Notes (Signed)
 Received information from [place], Vascular PA-C that patient will be going for IVC filter placement. Michele House endorses having spoken with both patient and son, Michele House. Both are agreeable to progressing with procedure.Consent signed by patient.

## 2023-11-09 NOTE — Progress Notes (Signed)
 Physical Therapy Treatment Patient Details Name: Michele House MRN: [id number] DOB: September 29, 1956 Today's Date: 11/09/2023   History of Present Illness Patient is a 68 year old with prolonged hospital stay, originally admitted with AMS. 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 She developed respiratory distress requiring mechanical ventilation. Self extubated 12/27 with re-intubation and extubation on 12/30.    PT Comments  Patient resting in bed upon arrival to room; son present at bedside (steps out during session).  Patient more agreeable to session this date, tolerating unsupported sitting for increased duration and with improved active effort during postural strengthening/alignment activities, though still requires max/total assist from therapist to maintain.  Constant encouragement with tasks, as patient easily frustrated and with limited attention after 1-2 reps of each activity. Persistent visual-perceptual deficits appreciated; will continue to assess and integrate as appropriate.  Difficult to fully assess/describe due to cognitive deficits and rapid fatigue with activities. R elbow flexion 2-/5, R gross grasp/release 2-/5; R hip extension 2-/5, R hip adduct 1/5.  Fatigues after 1-2 reps. OOB to chair deferred due to poor sitting balance and increased fall risk; will continue to assess/progress readiness for this as appropriate.     If plan is discharge home, recommend the following: Two people to help with walking and/or transfers;Two people to help with bathing/dressing/bathroom;Assistance with cooking/housework;Direct supervision/assist for medications management;Assistance with feeding;Direct supervision/assist for financial management;Assist for transportation;Help with stairs or ramp for entrance;Supervision due to cognitive status   Can travel by private vehicle        Equipment Recommendations       Recommendations for Other Services        Precautions / Restrictions Precautions Precautions: Fall Restrictions Weight Bearing Restrictions Per Provider Order: No     Mobility  Bed Mobility Overal bed mobility: Needs Assistance Bed Mobility: Supine to Sit, Sit to Supine, Rolling Rolling: Total assist   Supine to sit: Total assist Sit to supine: Total assist   General bed mobility comments: hand-over-hand to initiate and complete all functional activities    Transfers                   General transfer comment: unable/unsafe    Ambulation/Gait               General Gait Details: unable/unsafe   Stairs             Wheelchair Mobility     Tilt Bed    Modified Rankin (Stroke Patients Only)       Balance Overall balance assessment: Needs assistance Sitting-balance support: No upper extremity supported, Feet supported Sitting balance-Leahy Scale: Zero Sitting balance - Comments: absent balance reactions, absent awareness of midline; max/total assist for sitting balance, midline (all planes) at all times       Standing balance comment: unable/unsafe                            Cognition Arousal: Alert Behavior During Therapy: Impulsive   Area of Impairment: Following commands, Problem solving                       Following Commands: Follows one step commands with increased time     Problem Solving: Slow processing, Decreased initiation, Difficulty sequencing, Requires verbal cues, Requires tactile cues General Comments: Oriented to self, location; intermittently follows simple commands. Poor insight/awareness into deficits and need for assist with  all functional activities.        Exercises Other Exercises Other Exercises: Unsupported sitting edge of bed, max/total assist for R UE approximation and positioning, midline orientation and trunk control/postural extension. Improving head control and cervical extension this date, but fatigues quickly.  Co-treat  with OT to address light grooming and oral care activities (see OT note for details).  Tolerated sitting position x15 minutes this date (improved from previous session) with less agitation during session today.  Persistent visual-perceptual deficits noted; significant deficits in visual scanning/recognition with marked dysmetria during any attempted reaching efforts. Other Exercises: Does demonstrate at least 2-/5 in R elbow flexion, gross grasp/release this date; fatigues after 1-2 reps Other Exercises: Likewise, 2-/5 R hip extension and 1/5 R hip adduct intermittently; unable to consistently replicate and easily agitated with continued encouragement. Other Exercises: Repositioned to comfort in supine; pillow under R UE for support (and approximation of R shoulder subluxation-1 thumb width) Other Exercises: No active movement of R UE/LE noted during this session; absent sensory awareness, absent attention to/awareness of R hemi-body.    General Comments        Pertinent Vitals/Pain Pain Assessment Pain Assessment: No/denies pain    Home Living                          Prior Function            PT Goals (current goals can now be found in the care plan section) Acute Rehab PT Goals Patient Stated Goal: to get out of bed PT Goal Formulation: With patient Time For Goal Achievement: 11/16/23 Potential to Achieve Goals: Fair Progress towards PT goals: Progressing toward goals    Frequency    Min 1X/week      PT Plan      Co-evaluation PT/OT/SLP Co-Evaluation/Treatment: Yes Reason for Co-Treatment: Complexity of the patient's impairments (multi-system involvement);Necessary to address cognition/behavior during functional activity;For patient/therapist safety;To address functional/ADL transfers PT goals addressed during session: Mobility/safety with mobility OT goals addressed during session: ADL's and self-care      AM-PAC PT 6 Clicks Mobility   Outcome  Measure  Help needed turning from your back to your side while in a flat bed without using bedrails?: Total Help needed moving from lying on your back to sitting on the side of a flat bed without using bedrails?: Total Help needed moving to and from a bed to a chair (including a wheelchair)?: Total Help needed standing up from a chair using your arms (e.g., wheelchair or bedside chair)?: Total Help needed to walk in hospital room?: Total Help needed climbing 3-5 steps with a railing? : Total 6 Click Score: 6    End of Session Equipment Utilized During Treatment: Oxygen Activity Tolerance: Patient limited by fatigue Patient left: in bed;with call bell/phone within reach;with bed alarm set Nurse Communication: Mobility status PT Visit Diagnosis: Unsteadiness on feet (R26.81);Other abnormalities of gait and mobility (R26.89);Muscle weakness (generalized) (M62.81);Difficulty in walking, not elsewhere classified (R26.2);Hemiplegia and hemiparesis Hemiplegia - Right/Left: Right Hemiplegia - dominant/non-dominant: Dominant Hemiplegia - caused by: Cerebral infarction     Time: 1334-1410 PT Time Calculation (min) (ACUTE ONLY): 36 min  Charges:    $Neuromuscular Re-education: 8-22 mins PT General Charges $$ ACUTE PT VISIT: 1 Visit                    Alynah Schone H. Delores, PT, DPT, NCS 11/09/23, 2:40 PM [phone]

## 2023-11-09 NOTE — Progress Notes (Signed)
 NAME:  Michele House, MRN:  [id number], DOB:  Feb 12, 1956, LOS: 38 ADMISSION DATE:  10/02/2023   History of Present Illness:  Case of a 68 year old female patient with a past medical history of COPD with chronic respiratory failure on 2 L nasal cannula, hypertension, type 2 diabetes, heart failure with preserved EF who has been here for a month.  Initially presented with altered mental status encephalopathy.  Had an extensive workup including brain MRI which was negative for any acute intracranial abnormality initially.  However unfortunately course complicated by acute CVA with repeat MRI showing multiple acute infarcts in the high bilateral posterior frontal and parietal lobes.  She recovered from that but course further complicated by waxing and waning mental status which prompted an LP to rule out encephalitis and has been unremarkable so far.  She was found to have right upper lobe lung nodule which was concerning for malignancy and underwent robotic assisted navigational bronchoscopy with biopsy on 12/27 which showed very rare atypical cells and reactive alveolar cells mostly.    Pertinent  Medical History  As abnove  Significant Hospital Events: Including procedures, antibiotic start and stop dates in addition to other pertinent events   12/01: Vital stable, respiratory viral panel negative, procalcitonin negative, preliminary blood cultures negative.  Urine cultures are ordered as add-on, ammonia levels mildly elevated at 39, strep pneumo negative, CBG elevated at 244, history of enlarging pulmonary nodule with recommendations for PET/CT during most recent admission which has not been done yet, if video bronchoscopy was scheduled for 10/13/2023 as outpatient. 12/02: Unable to take care of herself and son cannot provide the required care as she required 24-hour supervision due to worsening dementia.  PT is recommending SNF. 12/03: Blood pressure started trending up, restarting home  Zestoretic .  Urine cultures with strep agalactiae, penicillin allergy noted, will continue with ceftriaxone  for now.  Also started on Remeron  for concern of worsening depression and unable to sleep at night  12/05:  Patient has some blurry vision and weakness and pain.  MRI of the brain negative for acute intracranial abnormality. 12/08:  Patient more confused today than yesterday.  Able to follow some simple commands but not as good of a conversation today as yesterday. 12/09:  Patient still confused today.  Does not follow simple commands today. 12/10: Patient more talkative today but was looking up at the ceiling when she was talking with me.  She does not move her extremities to command but does move them on her own.  Physical therapy and Occupational Therapy did not see her move her right arm. 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  Mild associated petechial hemorrhage.  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 12/12: Patient with new stroke, CTA head and neck with no large vessel occlusion.  Patient had a recent echocardiogram which was normal. Concern of paraneoplastic versus hypercoagulability secondary to malignancy.  Patient missed her appointment for bronchoscopy and outpatient appointment for PET scan due to recurrent hospitalizations. 12/13: Patient continued to have waxing and waning mental status, unable to move right upper and lower extremities.  Having some hallucination.  Pulmonary is planning lung biopsy on 12/27 if she remains stable.  Rapidly progressive dementia and now with underlying stroke, question of paraneoplastic encephalitis and hypercoagulability secondary to underlying undiagnosed malignancy. 12/15: Patient with much  improved mentation.  Having some flickering of movements on right  upper and lower extremity today, left extremities weaker but seems improving.   12/18: Pt underwent bronchoscopy and lung biopsy 12/19: patient stable , she is being optimized for dc for rehab.  She's cleared from pulmonary for discharge. Results from bronch will take 1 week.   12/26: Pt transferred to the stepdown unit with acute hypercapnic respiratory requiring Bipap but subsequently required mechanical intubation due to worsening respiratory failure.  Insulin  gtt initiated due to severe hyperglycemia.  With hemorrhagic shock due to bleeding duodenal ulcer, underwent emergent EGD with successful hemostasis by injection of Epinephrine  and hemostatic clip. 12/27: Hgb slowly trending down, no report of bleeding from OG suction nor melena. Weaning down vasopressors, if able to continue to wean vasopressors, then can consider WUA/SBT.  Giving temporizing measures for Hyperkalemia. Self extubated but required reintubation. Hgb continued to trend down, given 1 unit pRBC's and 2 FFP.  IR consulted for consideration for embolization. 12/28: Weaned off Vasopressin , Levophed  weaning down.  S/p IR embolization of the Duodenal artery. Peak pressures and lung sounds improved with Ketamine , will keep intubated today given rapid reintubation yesterday. 12/30- patient on PRVC FiO2 28%, weaning off ketamine  and hoping to perform SBT today.  Holding feeds until post SBT.  11/02/23- patient is stable overnight no acute events, electrllytes improved.  No GI bleeds noted.  Opitimizing for TRH today.  11/03/23 patient is more interactive this am, still has not been able to get OOB.  Will continue to follow her from pulmonary with COPD and lung cancer. Her pathology was sent for second opinion due to mixed reporting from cytotech and pathology report.  11/03/22- patient is good spirits this am. Son came to visit yesterday and we reivewed medical plan. H/h is stable. Following peripherally from pulmonary perspective for COPD and  lung mass.  11/05/23- patient on 3L/min.  She still has 9 day old Right internal jugular central line.  I have placed orders for RN to remove this line today.  She required BIPAP yesterday but states she did not feel SOB>  11/06/23- patient shares she feels well today.  She is on 3L/min but takes it off at times. She reports breathing feels improved 11/07/23- patient with no acute events overnight.  H/h is improved 11/08/23- patient seen at bedside.  Sitting up in bed in no distress denies dyspnea.  She is on nasal canula 2L/min.  She asks if we can help her get glasses and dentures.  I received inconclusive results from lung biopsy. I discussed this with patient.  We discussed repeating the biopsy and patient states she wants this done as soon as possible. She is clinically stable.  Diagnosis Lung, biopsy, Right upper lobe - VERY RARE ATYPICAL CELLS, FAVOR REACTIVE. - FRAGMENTS OF ALVEOLATED LUNG AND BLOOD CLOT. Diagnosis Note Per [facility] recent imaging revealed an enlarging spiculated 1.6 cm right upper lung lesion. This case underwent intradepartmental consultation and Dr. Reed concurs with the interpretation. See concurrent case [id number]. Rexene  11/09/23- patient with acute dvt.  Not good candidate for Antcoagulation per GI due to upper GI bleed with duod ulcer   Objective   Blood pressure (!) 145/76, pulse 92, temperature 97.8 F (36.6 C), resp. rate 16, height 5' (1.524 m), weight 66.1 kg, last menstrual period 12/24/1998, SpO2 100%.        Intake/Output Summary (Last 24 hours) at 11/09/2023 1305 Last data filed at 11/09/2023 1025 Gross per 24 hour  Intake  120 ml  Output --  Net 120 ml   Filed Weights   11/05/23 0621 11/06/23 0324 11/08/23 0500  Weight: 65.6 kg 64.2 kg 66.1 kg    Examination: General: Awake, follows verbal communication HENT: Supple neck reactive pupils  Lungs: mild wheezing b/l Cardiovascular: Normal S1, Normal S2, RRR  Abdomen: Soft, non tender, non distended,  +BS  Extremities: Warm and well perfused no edema.   Labs and imaging were reviewed.   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  This has been sent for second opinion to be reviewed.  I think its most likely primary lung cancer.  Have discussed repeat lung biopsy and patient is agreeable to do this.    Best Practice (right click and Reselect all SmartList Selections daily)   Diet/type: NPO DVT prophylaxis SCD Pressure ulcer(s): N/A GI prophylaxis: PPI Lines: Central line Foley:  Yes, and it is still needed Code Status:  full code     Ginelle Bays, M.D.  Pulmonary & Critical Care Medicine

## 2023-11-09 NOTE — Op Note (Signed)
 [facility]   OPERATIVE NOTE    PRE-OPERATIVE DIAGNOSIS: DVT, recent GI bleed  POST-OPERATIVE DIAGNOSIS: same as above  PROCEDURE: 1.   Ultrasound guidance for vascular access to the right femoral vein 2.   Catheter placement into the inferior vena cava 3.   Inferior venacavogram 4.   Placement of a Option Elite IVC filter  SURGEON: Selinda Gu, MD  ASSISTANT(S): None  ANESTHESIA: local with 1 mg of Versed   ESTIMATED BLOOD LOSS: minimal  CONTRAST: 10 cc  FLUORO TIME: less than one minute  FINDING(S): 1.  Patent IVC  SPECIMEN(S):  none  INDICATIONS:   Michele House is a 68 y.o. female who presents with a DVT and a recent GI bleed.  Inferior vena cava filter is indicated for this reason.  Risks and benefits including filter thrombosis, migration, fracture, bleeding, and infection were all discussed.  We discussed that all IVC filters that we place can be removed if desired from the patient once the need for the filter has passed.    DESCRIPTION: After obtaining full informed written consent, the patient was brought back to the vascular suite. The skin was sterilely prepped and draped in a sterile surgical field was created. Moderate conscious sedation was administered during a face to face encounter with the patient throughout the procedure with my supervision of the RN administering medicines and monitoring the patient's vital signs, pulse oximetry, telemetry and mental status throughout from the start of the procedure until the patient was taken to the recovery room. The right femoral vein was accessed under direct ultrasound guidance without difficulty with a Seldinger needle and a J-wire was then placed. After skin nick and dilatation, the delivery sheath was placed into the inferior vena cava and an inferior venacavogram was performed. This demonstrated a patent IVC with the level of the renal veins at L1.  The filter was then deployed into the  inferior vena cava at the level of L2 just below the renal veins. The delivery sheath was then removed. Pressure was held. Sterile dressings were placed. The patient tolerated the procedure well and was taken to the recovery room in stable condition.  COMPLICATIONS: None  CONDITION: Stable  Selinda Gu  11/09/2023, 5:32 PM   This note was created with Dragon Medical transcription system. Any errors in dictation are purely unintentional.

## 2023-11-09 NOTE — Progress Notes (Signed)
 Patient transferred to ultrasound at 1137 and returned at 1303 in bed in stable condition.  Upon return to unit, patient denies BLE pain; complaints of increased back pain.  Bed flattened to allow patient to go into supine position.  Following repositioning to supine , patient denies pain.

## 2023-11-09 NOTE — Progress Notes (Signed)
 Nutrition Follow-up  DOCUMENTATION CODES:   Not applicable  INTERVENTION:   -Continue MVI with minerals daily -Continue Magic cup TID with meals, each supplement provides 290 kcal and 9 grams of protein  -D/c Ensure Enlive po BID, each supplement provides 350 kcal and 20 grams of protein.   NUTRITION DIAGNOSIS:   Inadequate oral intake related to inability to eat (pt sedated and ventilated) as evidenced by NPO status.  Progressing; advanced to PO diet on 11/01/23  GOAL:   Patient will meet greater than or equal to 90% of their needs  Progressing   MONITOR:   PO intake, Supplement acceptance, Diet advancement  REASON FOR ASSESSMENT:   Ventilator    ASSESSMENT:   68 y/o female with h/o HTN, HLD, COPD, CHF, thyroid  disease, lung nodule and GERD who is admitted with COPD/asthma exacerbation in the setting of metapneumovirus infection, acute anemia with hemorrhagic shock secondary to duodenal ulcer s/p EGD with clip and Injection 10/28/2023 and s/p IR Embolization on 10/30/2023, AKI and acute CVA with right sided hemiparesis.  12/30- extubated, advanced to a dysphagia 3 diet  1/2- s/p BSE- advanced to regular diet with thin liquids  Reviewed I/O's: +120 ml x 24 hours and -2.5 L since 10/26/23   Per PCCM note rt upper lobe nodule noted, which showed rare atypical cells; suspect primary lung cancer and plan for repeat lung biopsy.   Pt receiving personal care at time of visit.   Pt on a carb modified diet. Noted meal completions 50-100%. Noted she has refused Ensure supplements over the past several days. RD will will d/c Ensure secondary to hyperglycemia and improved oral intake.   Wt has been stable over the past week.   Medications reviewed and include colace, lasix , protonix , miralax , prednisone , and senokot.   Per TOC notes, plan for SNF placement ([facility]) at discharge.   Labs reviewed: CBGS: 99-353 (inpatient orders for glycemic control are 0-5 units  insulin  aspart daily at bedtime, 0-9 units insulin  aspart TID with meals, 3 units insulin  aspart TID with meal,s and 10 units insulin  glargine-yfgn daily).    Diet Order:   Diet Order             Diet Carb Modified Fluid consistency: Thin  Diet effective now                   EDUCATION NEEDS:   No education needs have been identified at this time  Skin:  Skin Assessment: Reviewed RN Assessment  Last BM:  11/08/23 (type 5)  Height:   Ht Readings from Last 1 Encounters:  10/31/23 5' (1.524 m)    Weight:   Wt Readings from Last 1 Encounters:  11/08/23 66.1 kg    Ideal Body Weight:  45.45 kg  BMI:  Body mass index is 28.46 kg/m.  Estimated Nutritional Needs:   Kcal:  1600-1800  Protein:  85-100 grams  Fluid:  > 1.6 L    Margery ORN, RD, LDN, CDCES Registered Dietitian III Certified Diabetes Care and Education Specialist If unable to reach this RD, please use RD Inpatient group chat on secure chat between hours of 8am-4 pm daily

## 2023-11-09 NOTE — TOC Progression Note (Signed)
 Transition of Care [facility]) - Progression Note    Patient Details  Name: Michele House MRN: [id number] Date of Birth: Mar 02, 1956  Transition of Care [facility]) CM/SW Contact  Patrici Minnis A Nadea Kirkland, RN Phone Number: 11/09/2023, 1:03 PM  Clinical Narrative:    Chart reviewed.  Noted that patient SNF authorization is still pending.  Payor has requested updated PT and OT notes. I have made PT/OT staff aware.  I will updated Navi Portal with updated PT/OT notes once available.    TOC will continue to follow progress of patient.     Expected Discharge Plan: Skilled Nursing Facility Barriers to Discharge: Continued Medical Work up  Expected Discharge Plan and Services                                               Social Determinants of Health (SDOH) Interventions SDOH Screenings   Food Insecurity: Patient Unable To Answer (10/03/2023)  Recent Concern: Food Insecurity - Food Insecurity Present (09/24/2023)  Housing: High Risk (10/03/2023)  Transportation Needs: Patient Unable To Answer (10/03/2023)  Recent Concern: Transportation Needs - Unmet Transportation Needs (09/24/2023)  Utilities: Patient Unable To Answer (10/03/2023)  Social Connections: Patient Declined (11/02/2023)  Tobacco Use: Medium Risk (10/20/2023)    Readmission Risk Interventions     No data to display

## 2023-11-09 NOTE — Progress Notes (Signed)
 Occupational Therapy Treatment Patient Details Name: Michele House MRN: [id number] DOB: 26-Aug-1956 Today's Date: 11/09/2023   History of present illness Patient is a 68 year old with prolonged hospital stay, originally admitted with AMS. 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 She developed respiratory distress requiring mechanical ventilation. Self extubated 12/27 with re-intubation and extubation on 12/30.   OT comments  Pt received semi-reclined in bed. Appearing alert; willing to work with OT on sitting EOB for grooming. T/f MAX A. See flowsheet below for further details of session. Left semi-reclined in bed, rolled side to side for changing of wet sheets; with all needs in reach.        If plan is discharge home, recommend the following:  Assistance with cooking/housework;Assist for transportation;Help with stairs or ramp for entrance;Direct supervision/assist for medications management;Supervision due to cognitive status;Direct supervision/assist for financial management;Two people to help with walking and/or transfers;Two people to help with bathing/dressing/bathroom   Equipment Recommendations  Other (comment) (defer to next venue of care)    Recommendations for Other Services      Precautions / Restrictions Precautions Precautions: Fall Precaution Comments: right side hemiparesis Restrictions Weight Bearing Restrictions Per Provider Order: No       Mobility Bed Mobility Overal bed mobility: Needs Assistance Bed Mobility: Supine to Sit, Sit to Supine, Rolling Rolling: Max assist   Supine to sit: Max assist Sit to supine: Max assist        Transfers                   General transfer comment: sit to stand not attempted today 2/2 safety concerns.     Balance Overall balance assessment: Needs assistance Sitting-balance support: Bilateral upper extremity supported, Feet supported Sitting balance-Leahy Scale:  Zero Sitting balance - Comments: Pt able to tolerate 15 minutes seated on EOB, requiring constant support for balance. Intermittent flexion of neck with fatigue.                                   ADL either performed or assessed with clinical judgement   ADL Overall ADL's : Needs assistance/impaired     Grooming: Sitting;Brushing hair;Oral care;Wash/dry face;Moderate assistance Grooming Details (indicate cue type and reason): Pt requiring sitting balance assist from PT seated on pt's right side at EOB for sitting balance. Pt able to wash face with set up; MIN A brush teeth (OT assisting for cup of water  and basin to spit) with pt using L hand, which she states is her dominant hand. Pt appearing to have visual deficit, as object right in front of her she is often unable to locate.  Requiring MOD A for combing hair- she would only comb hair on her L side of head; OT assisting with back and R side of head; significant matting in back of head present and pt will require extensive conditioner if those knots are ever going to come loose.                               General ADL Comments: anticipate significant assist with all other ADLs at this time. bed level.    Extremity/Trunk Assessment Upper Extremity Assessment Upper Extremity Assessment: Left hand dominant;RUE deficits/detail RUE Deficits / Details: hemi with grip weakness; AAROM of elbow flexion. RUE: Subluxation noted RUE Sensation: decreased light touch  RUE Coordination: decreased fine motor;decreased gross motor   Lower Extremity Assessment Lower Extremity Assessment: Defer to PT evaluation        Vision       Perception     Praxis      Cognition Arousal: Alert Behavior During Therapy: Impulsive (a little irritated) Overall Cognitive Status: No family/caregiver present to determine baseline cognitive functioning                                 General Comments: Pt is oriented  to self and son; other orientation questions not explicitly asked. Pt intermittently irritated during session and when fatigued would refuse to further participate.        Exercises Other Exercises Other Exercises: Attempted to engage pt with ball toss activity (taped washcloth as ball), but pt fatigued after grooming and sitting EOB; refused to attempt participation; also appears limited by visual deficits.    Shoulder Instructions       General Comments Pt on room air when OT arrived; 85% saturation; placed on 2.5L and recovered within a few minutes.    Pertinent Vitals/ Pain       Pain Assessment Pain Assessment: No/denies pain  Home Living                                          Prior Functioning/Environment              Frequency  Min 1X/week        Progress Toward Goals  OT Goals(current goals can now be found in the care plan section)  Progress towards OT goals: Progressing toward goals  Acute Rehab OT Goals Patient Stated Goal: Go home OT Goal Formulation: With patient Time For Goal Achievement: 11/16/23 Potential to Achieve Goals: Fair ADL Goals Pt Will Perform Lower Body Dressing: with mod assist;sitting/lateral leans Pt Will Transfer to Toilet: with min assist Pt Will Perform Toileting - Clothing Manipulation and hygiene: with mod assist;sitting/lateral leans Additional ADL Goal #1: Pt will demonstrate good safety awareness during ADL/mobility tasks requiring no VC for safety, 3/3 opportunities.  Plan      Co-evaluation    PT/OT/SLP Co-Evaluation/Treatment: Yes Reason for Co-Treatment: Complexity of the patient's impairments (multi-system involvement);Necessary to address cognition/behavior during functional activity;For patient/therapist safety;To address functional/ADL transfers PT goals addressed during session: Mobility/safety with mobility OT goals addressed during session: ADL's and self-care      AM-PAC OT 6 Clicks  Daily Activity     Outcome Measure   Help from another person eating meals?: A Lot Help from another person taking care of personal grooming?: A Little Help from another person toileting, which includes using toliet, bedpan, or urinal?: Total Help from another person bathing (including washing, rinsing, drying)?: Total Help from another person to put on and taking off regular upper body clothing?: Total Help from another person to put on and taking off regular lower body clothing?: Total 6 Click Score: 9    End of Session Equipment Utilized During Treatment: Oxygen  OT Visit Diagnosis: Other abnormalities of gait and mobility (R26.89);Muscle weakness (generalized) (M62.81);Hemiplegia and hemiparesis Hemiplegia - Right/Left: Right Hemiplegia - dominant/non-dominant:  (pt states that she is left-handed; unclear if this is true.)   Activity Tolerance Patient tolerated treatment well   Patient Left in bed;with call bell/phone within reach;with  bed alarm set;with family/visitor present   Nurse Communication Mobility status        Time: 1334-1411 OT Time Calculation (min): 37 min  Charges: OT General Charges $OT Visit: 1 Visit OT Treatments $Self Care/Home Management : 8-22 mins  Jasmine Arlean Shams, MS, OTR/L  Jasmine Shams 11/09/2023, 3:50 PM

## 2023-11-09 NOTE — Consult Note (Signed)
 Hospital Consult    Reason for Consult:  IVC Filter placement  Requesting Physician:  Dr Aida Cho MD  MRN #:  [id number]  History of Present Illness: This is a 68 y.o. female who presents to [facility] emergency department on 10/02/2023 with altered mental status and respiratory failure.  Patient's had an extensive workup after having waxing and waning mental status which prompted a lumbar puncture ruling out encephalitis.  On 10/28/2023 the patient was transferred to stepdown unit with acute hypercapnic respiratory depression requiring BiPAP and mechanical intubation.  She was started on insulin  drip for severe hyperglycemia at that time.  She also had hemorrhagic shock due to bleeding of the duodenal ulcer.  She underwent emergent EGD with successful hemostasis by injection of epinephrine  and hemostatic clips.  Earlier today the patient was sent for ultrasound of bilateral lower extremities to rule out DVT.  In the notes it states that she only allowed them to do the right leg and not the left leg.  Patient denies this.  Patient however was found to have a DVT in her right lower extremity therefore a consult to vascular surgery was placed for IVC filter due to her inability to tolerate anticoagulation.  Past Medical History:  Diagnosis Date   Acid reflux    COPD (chronic obstructive pulmonary disease) (HCC)    Diabetes mellitus without complication (HCC)    Hyperlipidemia    Hypertension    Thyroid  disease     Past Surgical History:  Procedure Laterality Date   appendectomy     APPENDECTOMY     BREAST BIOPSY Right    CORE W/CLIP - NEG   ESOPHAGOGASTRODUODENOSCOPY N/A 10/28/2023   Procedure: ESOPHAGOGASTRODUODENOSCOPY (EGD);  Surgeon: Jinny Carmine, MD;  Location: [facility] ENDOSCOPY;  Service: Endoscopy;  Laterality: N/A;   IR EMBO ART  VEN HEMORR LYMPH EXTRAV  INC GUIDE ROADMAPPING  10/30/2023   TOTAL VAGINAL HYSTERECTOMY     TUMOR REMOVAL     benign;stomach   VIDEO BRONCHOSCOPY WITH  ENDOBRONCHIAL NAVIGATION Right 10/20/2023   Procedure: VIDEO BRONCHOSCOPY WITH ENDOBRONCHIAL NAVIGATION;  Surgeon: Parris Manna, MD;  Location: [facility] ORS;  Service: Thoracic;  Laterality: Right;    Allergies  Allergen Reactions   Penicillins Hives   Aspirin  Other (See Comments)    Lips numb   Sulfa Antibiotics     Prior to Admission medications   Medication Sig Start Date End Date Taking? Authorizing Provider  aspirin  EC 81 MG tablet Take 81 mg by mouth daily.   Yes [provider]  atorvastatin  (LIPITOR) 20 MG tablet Take 20 mg by mouth daily. 06/25/23  Yes [provider]  cetirizine (ZYRTEC) 10 MG tablet Take 10 mg by mouth daily.   Yes [provider]  COMBIVENT  RESPIMAT 20-100 MCG/ACT AERS respimat Inhale into the lungs every 6 (six) hours as needed for wheezing or shortness of breath. 09/06/23  Yes [provider]  dextromethorphan -guaiFENesin  (MUCINEX  DM) 30-600 MG 12hr tablet Take 1 tablet by mouth 2 (two) times daily as needed for cough. 06/08/23  Yes Amin, Sumayya, MD  diclofenac sodium (VOLTAREN) 1 % GEL Apply topically 4 (four) times daily.   Yes [provider]  DULERA 200-5 MCG/ACT AERO Inhale 2 puffs into the lungs every 12 (twelve) hours. 09/06/23  Yes [provider]  furosemide  (LASIX ) 20 MG tablet Take 1 tablet (20 mg total) by mouth daily. 09/27/23  Yes Shona Terry SAILOR, DO  glipiZIDE  (GLUCOTROL ) 10 MG tablet Take 10 mg by mouth daily  before breakfast.   Yes [provider]  ipratropium (ATROVENT HFA) 17 MCG/ACT inhaler Inhale 2 puffs into the lungs every 6 (six) hours.   Yes [provider]  levothyroxine  (SYNTHROID , LEVOTHROID) 100 MCG tablet Take 100 mcg by mouth daily before breakfast.   Yes [provider]  lisinopril -hydrochlorothiazide  (PRINZIDE ,ZESTORETIC ) 20-25 MG per tablet Take 1 tablet by mouth daily.   Yes [provider]  Multiple Vitamin (MULTIVITAMIN) capsule Take 1  capsule by mouth daily.   Yes [provider]  predniSONE  (STERAPRED UNI-PAK 21 TAB) 10 MG (21) TBPK tablet As directed on packaging 09/23/23  Yes Bradler, Evan K, MD  pregabalin  (LYRICA ) 50 MG capsule Take 50 mg by mouth 2 (two) times daily.   Yes [provider]  sertraline  (ZOLOFT ) 100 MG tablet Take 100 mg by mouth daily. 06/28/23  Yes [provider]  TRADJENTA  5 MG TABS tablet Take 5 mg by mouth daily. 07/26/23  Yes [provider]    Social History   Socioeconomic History   Marital status: Single    Spouse name: Not on file   Number of children: Not on file   Years of education: Not on file   Highest education level: Not on file  Occupational History   Not on file  Tobacco Use   Smoking status: Former    Current packs/day: 0.00    Types: Cigarettes    Quit date: 12/15/1985    Years since quitting: 37.9   Smokeless tobacco: Never  Substance and Sexual Activity   Alcohol use: No   Drug use: No   Sexual activity: Not Currently  Other Topics Concern   Not on file  Social History Narrative   Not on file   Social Drivers of Health   Financial Resource Strain: Not on file  Food Insecurity: Patient Unable To Answer (10/03/2023)   Hunger Vital Sign    Worried About Running Out of Food in the Last Year: Patient unable to answer    Ran Out of Food in the Last Year: Patient unable to answer  Recent Concern: Food Insecurity - Food Insecurity Present (09/24/2023)   Hunger Vital Sign    Worried About Running Out of Food in the Last Year: Sometimes true    Ran Out of Food in the Last Year: Sometimes true  Transportation Needs: Patient Unable To Answer (10/03/2023)   PRAPARE - Administrator, Civil Service (Medical): Patient unable to answer    Lack of Transportation (Non-Medical): Patient unable to answer  Recent Concern: Transportation Needs - Unmet Transportation Needs (09/24/2023)   PRAPARE - Scientist, Research (physical Sciences) (Medical): Yes    Lack of Transportation (Non-Medical): Yes  Physical Activity: Not on file  Stress: Not on file  Social Connections: Patient Declined (11/02/2023)   Social Connection and Isolation Panel [NHANES]    Frequency of Communication with Friends and Family: Patient declined    Frequency of Social Gatherings with Friends and Family: Patient declined    Attends Religious Services: Patient declined    Database Administrator or Organizations: Patient declined    Attends Banker Meetings: Patient declined    Marital Status: Patient declined  Intimate Partner Violence: Patient Unable To Answer (10/03/2023)   Humiliation, Afraid, Rape, and Kick questionnaire    Fear of Current or Ex-Partner: Patient unable to answer    Emotionally Abused: Patient unable to answer    Physically Abused: Patient unable to answer  Sexually Abused: Patient unable to answer     Family History  Problem Relation Age of Onset   Heart attack Mother    Hypertension Mother    Breast cancer Sister 38   Breast cancer Maternal Aunt        40'S   Breast cancer Maternal Grandmother     ROS: Otherwise negative unless mentioned in HPI  Physical Examination  Vitals:   11/09/23 0729 11/09/23 1518  BP: (!) 145/76 121/63  Pulse: 92 96  Resp: 16 15  Temp: 97.8 F (36.6 C) 97.8 F (36.6 C)  SpO2: 100% 97%   Body mass index is 28.46 kg/m.  General:  WDWN in NAD Gait: Not observed HENT: WNL, normocephalic Pulmonary: normal non-labored breathing, without Rales, rhonchi,  wheezing Cardiac: regular, without  Murmurs, rubs or gallops; without carotid bruits Abdomen: Positive bowel sounds throughout, soft, NT/ND, no masses Skin: without rashes Vascular Exam/Pulses: Palpable Pulses throughout Extremities: without ischemic changes, without Gangrene , without cellulitis; without open wounds; Denies any resting or ambulating pain to bilateral lower extremities.  Musculoskeletal:  no muscle wasting or atrophy  Neurologic: A&O X 3;  No focal weakness or paresthesias are detected; speech is fluent/normal Psychiatric:  The pt has Normal affect. Lymph:  Unremarkable  CBC    Component Value Date/Time   WBC 7.5 11/05/2023 0438   RBC 3.52 (L) 11/05/2023 0438   HGB 10.6 (L) 11/05/2023 0438   HGB 14.0 09/30/2013 0841   HCT 32.1 (L) 11/05/2023 0438   HCT 41.9 09/30/2013 0841   PLT 268 11/05/2023 0438   PLT 261 09/30/2013 0841   MCV 91.2 11/05/2023 0438   MCV 87 09/30/2013 0841   MCH 30.1 11/05/2023 0438   MCHC 33.0 11/05/2023 0438   RDW 18.4 (H) 11/05/2023 0438   RDW 13.4 09/30/2013 0841   LYMPHSABS 1.9 10/28/2023 1532   MONOABS 0.8 10/28/2023 1532   EOSABS 0.0 10/28/2023 1532   BASOSABS 0.1 10/28/2023 1532    BMET    Component Value Date/Time   NA 139 11/05/2023 0438   NA 136 09/30/2013 0841   K 3.6 11/05/2023 0438   K 3.7 09/30/2013 0841   CL 93 (L) 11/05/2023 0438   CL 99 09/30/2013 0841   CO2 34 (H) 11/05/2023 0438   CO2 31 09/30/2013 0841   GLUCOSE 195 (H) 11/05/2023 0438   GLUCOSE 207 (H) 09/30/2013 0841   BUN 13 11/05/2023 0438   BUN 27 (H) 09/30/2013 0841   CREATININE 0.60 11/05/2023 0438   CREATININE 1.04 09/30/2013 0841   CALCIUM  8.9 11/05/2023 0438   CALCIUM  9.2 09/30/2013 0841   GFRNONAA >60 11/05/2023 0438   GFRNONAA 59 (L) 09/30/2013 0841   GFRAA >60 08/19/2017 1618   GFRAA >60 09/30/2013 0841    COAGS: Lab Results  Component Value Date   INR 1.2 10/28/2023   INR 1.1 09/23/2023   INR 0.8 09/30/2013     Non-Invasive Vascular Imaging:   EXAM: BILATERAL LOWER EXTREMITY VENOUS DOPPLER ULTRASOUND   Only the right lower extremity was imaged. The patient refused imaging of the left lower extremity.   TECHNIQUE: Gray-scale sonography with graded compression, as well as color Doppler and duplex ultrasound were performed to evaluate the lower extremity deep venous systems from the level of the common femoral vein and including  the common femoral, femoral, profunda femoral, popliteal and calf veins including the posterior tibial, peroneal and gastrocnemius veins when visible. The superficial great saphenous vein was also interrogated. Spectral Doppler was  utilized to evaluate flow at rest and with distal augmentation maneuvers in the common femoral, femoral and popliteal veins.   COMPARISON:  None Available.   FINDINGS: RIGHT LOWER EXTREMITY   Common Femoral Vein: No evidence of thrombus. Normal compressibility, respiratory phasicity and response to augmentation.   Saphenofemoral Junction: No evidence of thrombus. Normal compressibility and flow on color Doppler imaging.   Profunda Femoral Vein: No evidence of thrombus. Normal compressibility and flow on color Doppler imaging.   Femoral Vein: No evidence of thrombus. Normal compressibility, respiratory phasicity and response to augmentation.   Popliteal Vein: No evidence of thrombus. Normal compressibility, respiratory phasicity and response to augmentation.   Calf Veins: Posterior tibial veins the are patent and compressible with color flow on color Doppler imaging. However, 1 of the posterior tibial veins is noncompressible and thrombosed.   Superficial Great Saphenous Vein: No evidence of thrombus. Normal compressibility.   Venous Reflux:  None.   Other Findings:  None.   LEFT LOWER EXTREMITY   Patient refused imaging.   IMPRESSION: 1. Positive for isolated calf DVT in the right lower extremity. Thrombus is present within 1 of the peroneal veins. 2. Patient declined imaging of the left lower extremity.  Statin:  Yes.   Beta Blocker:  No. Aspirin :  No. ACEI:  Yes.   ARB:  No. CCB use:  No Other antiplatelets/anticoagulants:  No.    ASSESSMENT/PLAN: This is a 68 y.o. female who presented to [facility] emergency department with respiratory failure and altered mental status.  On 10/28/2023 patient had a bleeding duodenal ulcer which sent  her into hemorrhagic shock therefore being intubated.  GI was able to do epinephrine  injections and micro clips to stop the bleeding.  Patient has since recovered.  Bilateral lower extremity ultrasounds were completed of the right leg only today showing DVT.  Vascular surgery is consulted for inferior vena cava filter placement.  I spoke to the patient who request that I get [name] on the phone while we speak.  I was able to get Alm on the phone and together we discussed in detail the patient going to the vascular lab and having an inferior vena cava filter placed today.  We discussed in detail the procedure, benefits, risk, and complications.  Both verbalized her understanding and wished to proceed.  I answered all their questions this afternoon.  Patient's son Alm verbalized that the patient is able to sign her own consents at this time and is comfortable with her doing so as we had the conversation together.  Patient said the last time she ate was 730 this morning as she had had part of her breakfast.   -I discussed the plan in detail with Dr. Selinda Gu MD and he agrees with the plan.   [facility] 11/09/2023 4:05 PM

## 2023-11-09 NOTE — Progress Notes (Signed)
 Dr Myriam Forehand notified of blood glucose fluctuations with no additional orders following blood glucose of 225.

## 2023-11-09 NOTE — Progress Notes (Addendum)
 Progress Note    Michele House  FMW:[id number] DOB: 1955/12/05  DOA: 10/02/2023 PCP: [facility]      Brief Narrative:    Medical records reviewed and are as summarized below:  Michele House is a 68 y.o. female with medical history significant of COPD, chronic respiratory failure on 2 L, hypertension, type 2 diabetes, HFpEF, recent discharge from the hospital 24th November 2024 after hospitalization for COPD exacerbation complicated by respiratory failure.  She presented to the hospital again on 10/02/2023 with chest pain, malaise, cough, shortness of breath, abdominal pain and dysuria.  She was admitted to the hospital for COPD exacerbation, acute on chronic hypoxemic respiratory failure, UTI and pneumonia.  She became more confused in the hospital and MRI brain revealed acute stroke.        12/1: Vital stable, respiratory viral panel negative, procalcitonin negative, preliminary blood cultures negative.  Urine cultures are ordered as add-on, ammonia levels mildly elevated at 39, strep pneumo negative, CBG elevated at 244, history of enlarging pulmonary nodule with recommendations for PET/CT during most recent admission which has not been done yet, if video bronchoscopy was scheduled for 10/13/2023 as outpatient.  Unable to take care of himself and House cannot provide the required care as she required 24-hour supervision due to worsening dementia.  PT is recommending SNF.  12/2: Hemodynamically stable, now on baseline oxygen of 2 L.  Needs SNF placement. Urine cultures pending.  12/3: Blood pressure started trending up, restarting home Zestoretic .  Urine cultures with strep agalactiae, penicillin allergy noted, will continue with ceftriaxone  for now.  Also started on Remeron  for concern of worsening depression and unable to sleep at night  12/4.  Some very concerned about patient's mental status not being seen.  Patient receiving antibiotics already  and should have improved by now.  Will hold Lipitor.  Restart low-dose Zoloft .  Discontinue Remeron  and do Seroquel  at night. 12/5.  Patient has some blurry vision and weakness and pain.  MRI of the brain negative for acute intracranial abnormality. 12/6.  Patient stated she slept well.  Patient able to lift up her arms up off the bed today.  Patient thought she heard her House's voice outside the room. 12/7.  Patient sitting up in chair when I saw her.  Was able to feed herself breakfast.  Felt okay.  Did not offer any complaints. 12/8.  Patient more confused today than yesterday.  Able to follow some simple commands but not as good of a conversation today as yesterday. 12/9.  Patient still confused today.  Does not follow simple commands today. 12/10.  Patient more talkative today but was looking up at the ceiling when she was talking with me.  She does not move her extremities to command but does move them on her own.  Physical therapy and Occupational Therapy did not see her move her right arm. 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  Mild associated petechial hemorrhage.  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 12/12: Patient with new stroke, CTA head and neck with no large vessel occlusion.  Patient had a recent echocardiogram which was normal. CSF cultures remain negative.  Lipid panel with increased triglyceride at 347, HDL 29 and LDL of 38.  CBC with mild thrombocytopenia at 130, BMP mostly unremarkable.  Patient with significant right-sided weakness. Concern of paraneoplastic versus hypercoagulability secondary to malignancy.  Patient missed her appointment for bronchoscopy and outpatient appointment for PET scan due to recurrent hospitalizations. Involving pulmonary and oncology for their input. Send  out encephalitis panel still pending.  12/13: Patient continued to have waxing and waning mental status, unable to move right upper and lower extremities.  Having some hallucination.  Pulmonary is planning lung biopsy on 12/27 if she remains stable.  Rapidly progressive dementia and now with underlying stroke, question of paraneoplastic encephalitis and hypercoagulability secondary to underlying undiagnosed malignancy. Patient will also get benefit from neuropsych evaluation as outpatient.  12/14: Patient with some improved mentation today.  Trying to squeeze with right hand but still no movements of right lower extremity.  12/15: Patient with much improved mentation.  Having some flickering of movements on right upper and lower extremity today, left extremities weaker but seems improving.   12/16: Mental status seems stable, flaccid right upper and lower extremities.  Complaining of muscle spasms so Robaxin  was ordered.  Pulmonary is trying to do bronchoscopy and biopsy on Wednesday.  12/18: Pt underwent bronchoscopy and lung biopsy 12/19: patient stable , she is being optimized for dc for rehab.  She's cleared from pulmonary for discharge. Results from bronch will take 1 week.   12/26: Pt transferred to the stepdown unit with acute hypercapnic respiratory requiring Bipap but subsequently required mechanical intubation due to worsening respiratory failure.  Insulin  gtt initiated due to severe hyperglycemia.  With hemorrhagic shock due to bleeding duodenal ulcer, underwent emergent EGD with successful hemostasis by injection of Epinephrine  and hemostatic clip. 12/27: Hgb slowly trending down, no report of bleeding from OG suction nor melena. Weaning down vasopressors, if able to continue to wean vasopressors, then can consider WUA/SBT.  Giving temporizing measures for Hyperkalemia. Self extubated but required reintubation. Hgb continued to trend down, given 1 unit pRBC's and 2 FFP.  IR consulted for  consideration for embolization. 12/28: Weaned off Vasopressin , Levophed  weaning down.  S/p IR embolization of the Duodenal artery. Peak pressures and lung sounds improved with Ketamine , will keep intubated today given rapid reintubation yesterday. 12/30- patient on PRVC FiO2 28%, weaning off ketamine  and hoping to perform SBT today.  Holding feeds until post SBT.  11/02/23- patient is stable overnight no acute events, electrllytes improved.  No GI bleeds noted.  Opitimizing for TRH today.     Assessment/Plan:   Principal Problem:   Acute metabolic encephalopathy Active Problems:   CVA (cerebral vascular accident) (HCC)   Uncontrolled type 2 diabetes mellitus with hypoglycemia, without long-term current use of insulin  (HCC)   Hypotension   Acute on chronic respiratory failure with hypoxia (HCC)   Hypernatremia   COPD exacerbation (HCC)   Lung nodule   Pneumonia   UTI (urinary tract infection)   (HFpEF) heart failure with preserved ejection fraction (HCC)   Essential hypertension   Type 2 diabetes mellitus with obesity (HCC)   Melena   Gastrointestinal hemorrhage with melena   Duodenal ulcer   Acute deep vein thrombosis (DVT) of right lower extremity (HCC)   Bilateral leg pain    Body mass index is 28.46 kg/m.   Acute metabolic encephalopathy: Improved. Initially completed 5 days of high-dose several IV Solu-Medrol  on 10/12/2023 Differential diagnosis include autoimmune encephalitis   Acute stroke with right hemiplegia: Off of Plavix  because of recent acute GI bleeding.   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   Acute GI bleeding, bleeding duodenal ulcer: Continue Protonix . S/p EGD on 10/28/2023 with clip placed.  S/p IR embolization on 10/30/2023.     Lower extremity  pain, more severe in the left, right peroneal DVT: Patient complaining of pain in the lower extremities today.  She refused ultrasound of the left lower extremity because of severe pain.  Ultrasound of the right lower extremity showed right calf DVT.  She has decided to proceed with left lower extremity ultrasound.  Suzen, ultrasound tech, has been notified. I had discussed the possibility of anticoagulation for new DVT with Dr. Jinny, gastroenterologist, who recently performed EGD on patient for bleeding duodenal ulcer.  He said that patient is at high risk of recurrent GI bleeding given recent massive GI bleed requiring embolization by IR. Consulted Dr. Marea, vascular surgeon, to evaluate patient for possible IVC placement.   Hemorrhagic shock: Resolved Hypotension: Resolved.  Off of vasopressors   Acute blood loss anemia: H&H is stable.  S/p transfusion with 2 units of PRBCs on 10/28/2023, 1 unit of PRBCs on 10/29/2023.  S/p transfusion with 2 units of platelet pheresis on 10/29/2023. H&H is stable.   Pneumonia: Initially completed 5 days of IV ceftriaxone  and azithromycin  on 10/07/2023. She was restarted on broad-spectrum antibiotics (vancomycin , ceftriaxone  and Flagyl ) on 10/28/2023 when she decompensated requiring transfer to the ICU. Acute UTI: Urine culture showed strep agalactiae.  Completed antibiotics.   Acute on chronic diastolic CHF: Stable.  S/p treatment with IV Lasix .  Continue oral Lasix  Anxiety/panic attacks: Ativan  as needed.  Continue sertraline    COPD exacerbation: This had previously resolved.  However, she had recurrent COPD exacerbation and was restarted on steroids. Continue prednisone  taper through pulmonary 2025.   Acute on chronic hypoxemic respiratory failure, chronic hypercapnic respiratory failure: She was intubated on 10/28/2023 and extubated on 11/01/2023. She is tolerating 2 L/min oxygen via nasal cannula. She was strongly encouraged to use BiPAP every  night to reduce risk of recurrent COPD exacerbation or worsening espiratory failure.   Type II DM with severe hyperglycemia, hypoglycemia blood sugar down to 48 on 11/09/2023: Decrease Semglee  from 10 units to 8 units daily.  Discontinue scheduled NovoLog  3 units with meals.  Use NovoLog  as needed for hyperglycemia.  Monitor glucose levels and adjust insulin  as needed. S/p treatment with IV insulin  drip. She may need less insulin  as prednisone  is being tapered off.   1.3 cm right upper lung spiculated nodule: S/p bronchoscopy on 10/20/2023.   Pathology was negative for malignancy and findings more consistent with reactive bronchial cells with acute inflammation. Pulmonologist is planning repeat lung biopsy   General weakness: PT and OT recommended discharge to SNF.   Plan discussed with Alm, House, at the bedside    Diet Order             Diet NPO time specified  Diet effective midnight           Diet Carb Modified Fluid consistency: Thin  Diet effective now                            Consultants: Pulmonologist Hematologist Intensivist Gastroenterologist Interventional radiologist  Procedures: Lumbar puncture on 10/08/2023 Bronchoscopy on 10/20/2023 Intubation and mechanical ventilation on 10/28/2023 EGD on 10/28/2023 IR embolization of duodenal artery on 10/30/2023    Medications:    [  MAR Hold] arformoterol   15 mcg Nebulization BID   [MAR Hold] Chlorhexidine  Gluconate Cloth  6 each Topical Daily   [MAR Hold] docusate sodium   100 mg Oral BID   [MAR Hold] furosemide   20 mg Oral Daily   [MAR Hold] insulin  aspart  0-5 Units Subcutaneous QHS   [MAR Hold] insulin  aspart  0-9 Units Subcutaneous TID WC   [MAR Hold] insulin  aspart  3 Units Subcutaneous TID WC   [MAR Hold] insulin  glargine-yfgn  10 Units Subcutaneous Daily   [MAR Hold] ipratropium-albuterol   3 mL Nebulization BID   [MAR Hold] levothyroxine   100 mcg Oral Q0600   [MAR Hold]  morphine  injection   2 mg Intravenous Once   [MAR Hold] multivitamin with minerals  1 tablet Oral Daily   [MAR Hold] mouth rinse  15 mL Mouth Rinse BID   [MAR Hold] pantoprazole   40 mg Oral BID   [MAR Hold] polyethylene glycol  17 g Oral Daily   [MAR Hold] predniSONE   15 mg Oral Q breakfast   Followed by   [FJM Hold] predniSONE   10 mg Oral Q breakfast   [MAR Hold] rOPINIRole   4 mg Oral QHS   [MAR Hold] senna-docusate  1 tablet Oral Daily   [MAR Hold] sertraline   50 mg Oral Daily   Continuous Infusions:  sodium chloride       ceFAZolin  (ANCEF ) IV        Anti-infectives (From admission, onward)    Start     Dose/Rate Route Frequency Ordered Stop   11/09/23 1609  ceFAZolin  (ANCEF ) IVPB 2g/100 mL premix        2 g 200 mL/hr over 30 Minutes Intravenous 30 min pre-op 11/09/23 1609     10/31/23 2200  cefTRIAXone  (ROCEPHIN ) 1 g in sodium chloride  0.9 % 100 mL IVPB        1 g 200 mL/hr over 30 Minutes Intravenous Every 24 hours 10/31/23 1128 11/03/23 2216   10/30/23 2200  Vancomycin  (VANCOCIN ) 1,500 mg in sodium chloride  0.9 % 500 mL IVPB  Status:  Discontinued        1,500 mg 250 mL/hr over 120 Minutes Intravenous Every 48 hours 10/28/23 1956 10/29/23 0741   10/29/23 1400  metroNIDAZOLE  (FLAGYL ) IVPB 500 mg  Status:  Discontinued        500 mg 100 mL/hr over 60 Minutes Intravenous Every 12 hours 10/29/23 1304 10/31/23 1427   10/28/23 2200  ceFEPIme  (MAXIPIME ) 2 g in sodium chloride  0.9 % 100 mL IVPB  Status:  Discontinued        2 g 200 mL/hr over 30 Minutes Intravenous Every 12 hours 10/28/23 2003 10/31/23 1127   10/28/23 2045  vancomycin  (VANCOCIN ) 1,500 mg in sodium chloride  0.9 % 500 mL IVPB        1,500 mg 257.5 mL/hr over 120 Minutes Intravenous  Once 10/28/23 1954 10/28/23 2050   10/04/23 1000  azithromycin  (ZITHROMAX ) tablet 500 mg        500 mg Oral Daily 10/03/23 0751 10/06/23 0856   10/03/23 0600  cefTRIAXone  (ROCEPHIN ) 2 g in sodium chloride  0.9 % 100 mL IVPB  Status:  Discontinued        2  g 200 mL/hr over 30 Minutes Intravenous Every 24 hours 10/02/23 0740 10/07/23 0854   10/02/23 0745  cefTRIAXone  (ROCEPHIN ) 2 g in sodium chloride  0.9 % 100 mL IVPB  Status:  Discontinued        2 g 200 mL/hr over 30 Minutes Intravenous Every 24 hours  10/02/23 0738 10/02/23 0740   10/02/23 0745  azithromycin  (ZITHROMAX ) 500 mg in sodium chloride  0.9 % 250 mL IVPB        500 mg 250 mL/hr over 60 Minutes Intravenous Every 24 hours 10/02/23 0738 10/03/23 1236   10/02/23 0630  cefTRIAXone  (ROCEPHIN ) 2 g in sodium chloride  0.9 % 100 mL IVPB        2 g 200 mL/hr over 30 Minutes Intravenous  Once 10/02/23 0617 10/02/23 0714              Family Communication/Anticipated D/C date and plan/Code Status   DVT prophylaxis: SCDs Start: 10/02/23 9261     Code Status: Do not attempt resuscitation (DNR) PRE-ARREST INTERVENTIONS DESIRED  Family Communication: Plan discussed with House at the bedside Disposition Plan: Plan to discharge to SNF    Status is: Inpatient Remains inpatient appropriate because: Awaiting placement to SNF       Subjective:   Interval events noted.  She complained of pain in the lower extremities, more severe in the left leg and left thigh.  No shortness of breath or chest pain.  Objective:    Vitals:   11/09/23 0718 11/09/23 0729 11/09/23 1518 11/09/23 1650  BP:  (!) 145/76 121/63 127/72  Pulse:  92 96 95  Resp:  16 15 20   Temp:  97.8 F (36.6 C) 97.8 F (36.6 C) 98 F (36.7 C)  TempSrc:    Oral  SpO2: 98% 100% 97% 95%  Weight:    66.1 kg  Height:    5' (1.524 m)   No data found.   Intake/Output Summary (Last 24 hours) at 11/09/2023 1654 Last data filed at 11/09/2023 1532 Gross per 24 hour  Intake 120 ml  Output --  Net 120 ml    Filed Weights   11/06/23 0324 11/08/23 0500 11/09/23 1650  Weight: 64.2 kg 66.1 kg 66.1 kg    Exam:  GEN: NAD SKIN: Warm and dry EYES: No pallor or icterus ENT: MMM CV: RRR PULM: CTA B ABD: soft, ND, NT,  +BS CNS: AAO x 3, right hemiparesis EXT: Tenderness in the left leg and left thigh.  No swelling or erythema noted.     Data Reviewed:   I have personally reviewed following labs and imaging studies:  Labs: Labs show the following:   Basic Metabolic Panel: Recent Labs  Lab 11/03/23 0401 11/04/23 0511 11/05/23 0438  NA 140 139 139  K 4.1 3.8 3.6  CL 99 97* 93*  CO2 34* 33* 34*  GLUCOSE 90 112* 195*  BUN 14 11 13   CREATININE 0.53 0.48 0.60  CALCIUM  8.1* 8.3* 8.9  MG 2.0 2.1 2.3  PHOS 2.2* 2.8 2.1*   GFR Estimated Creatinine Clearance: 56.3 mL/min (by C-G formula based on SCr of 0.6 mg/dL). Liver Function Tests: No results for input(s): AST, ALT, ALKPHOS, BILITOT, PROT, ALBUMIN in the last 168 hours.  No results for input(s): LIPASE, AMYLASE in the last 168 hours. No results for input(s): AMMONIA in the last 168 hours.   Coagulation profile No results for input(s): INR, PROTIME in the last 168 hours.   CBC: Recent Labs  Lab 11/03/23 0401 11/04/23 0511 11/05/23 0438  WBC 4.5 5.1 7.5  HGB 8.3* 8.9* 10.6*  HCT 26.4* 28.0* 32.1*  MCV 96.4 96.6 91.2  PLT 219 208 268   Cardiac Enzymes: No results for input(s): CKTOTAL, CKMB, CKMBINDEX, TROPONINI in the last 168 hours. BNP (last 3 results) No results for input(s): PROBNP  in the last 8760 hours. CBG: Recent Labs  Lab 11/08/23 1546 11/08/23 2006 11/09/23 1139 11/09/23 1516 11/09/23 1653  GLUCAP 127* 99 253* 108* 48*   D-Dimer: No results for input(s): DDIMER in the last 72 hours. Hgb A1c: No results for input(s): HGBA1C in the last 72 hours. Lipid Profile: No results for input(s): CHOL, HDL, LDLCALC, TRIG, CHOLHDL, LDLDIRECT in the last 72 hours.  Thyroid  function studies: No results for input(s): TSH, T4TOTAL, T3FREE, THYROIDAB in the last 72 hours.  Invalid input(s): FREET3 Anemia work up: No results for input(s): VITAMINB12, FOLATE,  FERRITIN, TIBC, IRON , RETICCTPCT in the last 72 hours. Sepsis Labs: Recent Labs  Lab 11/03/23 0401 11/04/23 0511 11/05/23 0438  WBC 4.5 5.1 7.5    Microbiology No results found for this or any previous visit (from the past 240 hours).   Procedures and diagnostic studies:  US  Venous Img Lower Bilateral (DVT) Result Date: 11/09/2023 CLINICAL DATA:  Bilateral lower extremity pain EXAM: BILATERAL LOWER EXTREMITY VENOUS DOPPLER ULTRASOUND Only the right lower extremity was imaged. The patient refused imaging of the left lower extremity. TECHNIQUE: Gray-scale sonography with graded compression, as well as color Doppler and duplex ultrasound were performed to evaluate the lower extremity deep venous systems from the level of the common femoral vein and including the common femoral, femoral, profunda femoral, popliteal and calf veins including the posterior tibial, peroneal and gastrocnemius veins when visible. The superficial great saphenous vein was also interrogated. Spectral Doppler was utilized to evaluate flow at rest and with distal augmentation maneuvers in the common femoral, femoral and popliteal veins. COMPARISON:  None Available. FINDINGS: RIGHT LOWER EXTREMITY Common Femoral Vein: No evidence of thrombus. Normal compressibility, respiratory phasicity and response to augmentation. Saphenofemoral Junction: No evidence of thrombus. Normal compressibility and flow on color Doppler imaging. Profunda Femoral Vein: No evidence of thrombus. Normal compressibility and flow on color Doppler imaging. Femoral Vein: No evidence of thrombus. Normal compressibility, respiratory phasicity and response to augmentation. Popliteal Vein: No evidence of thrombus. Normal compressibility, respiratory phasicity and response to augmentation. Calf Veins: Posterior tibial veins the are patent and compressible with color flow on color Doppler imaging. However, 1 of the posterior tibial veins is noncompressible and  thrombosed. Superficial Great Saphenous Vein: No evidence of thrombus. Normal compressibility. Venous Reflux:  None. Other Findings:  None. LEFT LOWER EXTREMITY Patient refused imaging. IMPRESSION: 1. Positive for isolated calf DVT in the right lower extremity. Thrombus is present within 1 of the peroneal veins. 2. Patient declined imaging of the left lower extremity. Electronically Signed   By: Wilkie Lent M.D.   On: 11/09/2023 13:11                LOS: 38 days   Porchia Sinkler  [facility]   Pager on [email or web address]. If 7PM-7AM, please contact night-coverage at [email or web address]     11/09/2023, 4:54 PM

## 2023-11-10 ENCOUNTER — Inpatient Hospital Stay: Payer: 59

## 2023-11-10 ENCOUNTER — Encounter: Payer: Self-pay | Admitting: Vascular Surgery

## 2023-11-10 DIAGNOSIS — G9341 Metabolic encephalopathy: Secondary | ICD-10-CM | POA: Diagnosis not present

## 2023-11-10 LAB — CBC
HCT: 34 % — ABNORMAL LOW (ref 36.0–46.0)
Hemoglobin: 11 g/dL — ABNORMAL LOW (ref 12.0–15.0)
MCH: 29.7 pg (ref 26.0–34.0)
MCHC: 32.4 g/dL (ref 30.0–36.0)
MCV: 91.9 fL (ref 80.0–100.0)
Platelets: 435 10*3/uL — ABNORMAL HIGH (ref 150–400)
RBC: 3.7 MIL/uL — ABNORMAL LOW (ref 3.87–5.11)
RDW: 16.3 % — ABNORMAL HIGH (ref 11.5–15.5)
WBC: 11.5 10*3/uL — ABNORMAL HIGH (ref 4.0–10.5)
nRBC: 0 % (ref 0.0–0.2)

## 2023-11-10 LAB — CBC WITH DIFFERENTIAL/PLATELET
Abs Immature Granulocytes: 0.07 10*3/uL (ref 0.00–0.07)
Basophils Absolute: 0 10*3/uL (ref 0.0–0.1)
Basophils Relative: 0 %
Eosinophils Absolute: 0 10*3/uL (ref 0.0–0.5)
Eosinophils Relative: 0 %
HCT: 32.6 % — ABNORMAL LOW (ref 36.0–46.0)
Hemoglobin: 10.8 g/dL — ABNORMAL LOW (ref 12.0–15.0)
Immature Granulocytes: 1 %
Lymphocytes Relative: 10 %
Lymphs Abs: 0.6 10*3/uL — ABNORMAL LOW (ref 0.7–4.0)
MCH: 30.5 pg (ref 26.0–34.0)
MCHC: 33.1 g/dL (ref 30.0–36.0)
MCV: 92.1 fL (ref 80.0–100.0)
Monocytes Absolute: 0.5 10*3/uL (ref 0.1–1.0)
Monocytes Relative: 9 %
Neutro Abs: 4.5 10*3/uL (ref 1.7–7.7)
Neutrophils Relative %: 80 %
Platelets: 311 10*3/uL (ref 150–400)
RBC: 3.54 MIL/uL — ABNORMAL LOW (ref 3.87–5.11)
RDW: 16.6 % — ABNORMAL HIGH (ref 11.5–15.5)
WBC: 5.6 10*3/uL (ref 4.0–10.5)
nRBC: 0 % (ref 0.0–0.2)

## 2023-11-10 LAB — GLUCOSE, CAPILLARY
Glucose-Capillary: 191 mg/dL — ABNORMAL HIGH (ref 70–99)
Glucose-Capillary: 328 mg/dL — ABNORMAL HIGH (ref 70–99)
Glucose-Capillary: 352 mg/dL — ABNORMAL HIGH (ref 70–99)
Glucose-Capillary: 346 mg/dL — ABNORMAL HIGH (ref 70–99)
Glucose-Capillary: 267 mg/dL — ABNORMAL HIGH (ref 70–99)

## 2023-11-10 LAB — BASIC METABOLIC PANEL
Anion gap: 8 (ref 5–15)
Anion gap: 13 (ref 5–15)
BUN: 13 mg/dL (ref 8–23)
BUN: 15 mg/dL (ref 8–23)
CO2: 32 mmol/L (ref 22–32)
CO2: 26 mmol/L (ref 22–32)
Calcium: 8.9 mg/dL (ref 8.9–10.3)
Calcium: 8.9 mg/dL (ref 8.9–10.3)
Chloride: 100 mmol/L (ref 98–111)
Chloride: 98 mmol/L (ref 98–111)
Creatinine, Ser: 0.55 mg/dL (ref 0.44–1.00)
Creatinine, Ser: 0.74 mg/dL (ref 0.44–1.00)
GFR, Estimated: >60 mL/min (ref >60)
GFR, Estimated: >60 mL/min (ref >60)
Glucose, Bld: 165 mg/dL — ABNORMAL HIGH (ref 70–99)
Glucose, Bld: 383 mg/dL — ABNORMAL HIGH (ref 70–99)
Potassium: 4 mmol/L (ref 3.5–5.1)
Potassium: 3.9 mmol/L (ref 3.5–5.1)
Sodium: 140 mmol/L (ref 135–145)
Sodium: 137 mmol/L (ref 135–145)

## 2023-11-10 LAB — LACTIC ACID, PLASMA
Lactic Acid, Venous: 1.4 mmol/L (ref 0.5–1.9)
Lactic Acid, Venous: 5 mmol/L (ref 0.5–1.9)

## 2023-11-10 LAB — MAGNESIUM: Magnesium: 2.1 mg/dL (ref 1.7–2.4)

## 2023-11-10 MED ORDER — LORAZEPAM 2 MG/ML IJ SOLN
INTRAMUSCULAR | Status: AC
  Administered 2023-11-10: 2 mg
  Filled 2023-11-10: qty 1

## 2023-11-10 MED ORDER — LEVETIRACETAM 500 MG/5ML IV SOLN: 2000.0000 mg | Freq: Once | INTRAVENOUS | Status: DC

## 2023-11-10 MED ORDER — LEVETIRACETAM IN NACL 1000 MG/100ML IV SOLN
1000.0000 mg | Freq: Once | INTRAVENOUS | Status: AC
  Administered 2023-11-10: 1000 mg via INTRAVENOUS
  Filled 2023-11-10: qty 100

## 2023-11-10 MED ORDER — LORAZEPAM 2 MG/ML IJ SOLN: 2.0000 mg | Freq: Once | INTRAMUSCULAR | Status: DC

## 2023-11-10 MED ORDER — LACTATED RINGERS IV BOLUS
500.0000 mL | Freq: Once | INTRAVENOUS | Status: AC
  Administered 2023-11-10: 500 mL via INTRAVENOUS

## 2023-11-10 MED ORDER — LEVETIRACETAM IN NACL 500 MG/100ML IV SOLN
500.0000 mg | Freq: Two times a day (BID) | INTRAVENOUS | Status: DC
  Administered 2023-11-11 – 2023-11-13 (×6): 500 mg via INTRAVENOUS
  Filled 2023-11-10 (×7): qty 100

## 2023-11-10 NOTE — TOC Progression Note (Signed)
 Transition of Care [facility]) - Progression Note    Patient Details  Name: Michele House MRN: [id number] Date of Birth: 23-Feb-1956  [facility] CM/SW Contact  Ladene Lady, LCSW Phone Number: 11/10/2023, 9:24 AM  Clinical Narrative:   Updated PT/OT notes uploaded to [facility].     Expected Discharge Plan: Skilled Nursing Facility Barriers to Discharge: Continued Medical Work up  Expected Discharge Plan and Services                                               Social Determinants of Health (SDOH) Interventions SDOH Screenings   Food Insecurity: Patient Unable To Answer (10/03/2023)  Recent Concern: Food Insecurity - Food Insecurity Present (09/24/2023)  Housing: High Risk (10/03/2023)  Transportation Needs: Patient Unable To Answer (10/03/2023)  Recent Concern: Transportation Needs - Unmet Transportation Needs (09/24/2023)  Utilities: Patient Unable To Answer (10/03/2023)  Social Connections: Patient Declined (11/02/2023)  Tobacco Use: Medium Risk (11/09/2023)    Readmission Risk Interventions     No data to display

## 2023-11-10 NOTE — Progress Notes (Addendum)
       CROSS COVER NOTE  NAME: Michele House MRN: [id number] DOB : 03-16-1956    Date of Service   11/10/2023   HPI/Events of Note   Rapid response called on patient.  According to care nurse patient was alert and oriented in her usual state of health during her initial assessment.  However when the nurse came back to the room, patient reportedly was alert and talking but seem became responsive ascetic convulsing.  When this NP arrived at bedside patient was seizing actively.  Instructed was given to lay patient on the side and suction secretions.  Supplemental oxygen applied to patient.  2 mg of Ativan  given.  Interventions   -Stat head CT completed.  Images reviewed by neurology no acute intracranial processes was seen. -Patient was loaded with Keppra  2 g and started on Keppra  500 mg twice daily as recommended by neuro. -Official neuroconsult placed.  Patient to be seen this a.m. -Care level upgraded to PCU.       Keleigh Kazee Lamin Holly Pring, MSN, APRN, AGACNP-BC [facility] [place] Pager: [phone]. Check Amion for Availability

## 2023-11-10 NOTE — Plan of Care (Signed)
  Problem: Education: Goal: Knowledge of disease or condition will improve Outcome: Not Progressing Goal: Knowledge of secondary prevention will improve (MUST DOCUMENT ALL) Outcome: Not Progressing Goal: Knowledge of patient specific risk factors will improve Alonso N/A or DELETE if not current risk factor) Outcome: Not Progressing   Problem: Ischemic Stroke/TIA Tissue Perfusion: Goal: Complications of ischemic stroke/TIA will be minimized Outcome: Not Progressing   Problem: Coping: Goal: Will verbalize positive feelings about self Outcome: Not Progressing Goal: Will identify appropriate support needs Outcome: Not Progressing   Problem: Health Behavior/Discharge Planning: Goal: Ability to manage health-related needs will improve Outcome: Not Progressing Goal: Goals will be collaboratively established with patient/family Outcome: Not Progressing   Problem: Self-Care: Goal: Ability to participate in self-care as condition permits will improve Outcome: Not Progressing Goal: Verbalization of feelings and concerns over difficulty with self-care will improve Outcome: Not Progressing Goal: Ability to communicate needs accurately will improve Outcome: Not Progressing

## 2023-11-10 NOTE — Progress Notes (Signed)
 Progress Note   Patient: Michele House FMW:[id number] DOB: 1956-03-21 DOA: 10/02/2023     39 DOS: the patient was seen and examined on 11/10/2023   Brief hospital course:  Michele House is a 68 y.o. female with medical history significant of COPD, chronic respiratory failure on 2 L, hypertension, type 2 diabetes, HFpEF, recent discharge from the hospital 24th November 2024 after hospitalization for COPD exacerbation complicated by respiratory failure.  She presented to the hospital again on 10/02/2023 with chest pain, malaise, cough, shortness of breath, abdominal pain and dysuria.   She was admitted to the hospital for COPD exacerbation, acute on chronic hypoxemic respiratory failure, UTI and pneumonia.  She later developed worsening confusions, MRI brain revealed multiple acute strokes.   Prolonged hospital admission complicated by several issues including hemorrhagic shock requiring transfer to ICU for pressors and GI consultation. EGD showed duodenal ulcer/s that were treated with clipped but later required embolization with IR.  Pt also found with RLE DVT status post IVC filter placement given bleeding anticoagulation contraindicated.  For detailed hospital course and events, see [facility] progress note by Dr. Jens of 11/09/23.  Further hospital course and management as outlined below.   Assessment and Plan:  Acute metabolic encephalopathy: Improved. Initially completed 5 days of high-dose several IV Solu-Medrol  on 10/12/2023 Differential diagnosis include autoimmune encephalitis     Acute stroke with right hemiplegia: Off of Plavix  because of recent acute GI bleeding.   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     Acute GI bleeding, bleeding duodenal ulcer:  S/p EGD on 10/28/2023 with  clip placed.   S/p IR embolization on 10/30/2023.   --Continue Protonix . --Monitor CBC --Transfuse if Hbg < 7 or less than 8 with active bleeding or hemodynamic instability     Lower extremity pain, more severe in the left, right peroneal DVT: Patient complaining of pain in the lower extremities 1/7.  Initially refused ultrasound of the left lower extremity because of severe pain.  Ultrasound of the right lower extremity showed right calf DVT.  LLE U/S obtained 1/8 after pt agreed - no LLE DVT was seen. Due to high risk of recurrent GI bleeding given recent massive GI bleed requiring embolization by IR, unable to treat with anticoagulation. Vascular surgery placed IVC filter on 11/09/23     Hemorrhagic shock: Resolved Hypotension: Resolved.  Off of vasopressors    Acute blood loss anemia: H&H is stable.  S/p transfusion with 2 units of PRBCs on 10/28/2023, 1 unit of PRBCs on 10/29/2023.  S/p transfusion with 2 units of platelet pheresis on 10/29/2023. H&H is stable.     Pneumonia: Initially completed 5 days of IV ceftriaxone  and azithromycin  on 10/07/2023. She was restarted on broad-spectrum antibiotics (vancomycin , ceftriaxone  and Flagyl ) on 10/28/2023 when she decompensated requiring transfer to the ICU. Acute UTI: Urine culture showed strep agalactiae.  Completed antibiotics.     Acute on chronic diastolic CHF: Stable.  S/p treatment with IV Lasix .  Continue oral Lasix  Anxiety/panic attacks: Ativan  as needed.  Continue sertraline      COPD exacerbation: This had previously resolved.  However, she had recurrent COPD exacerbation and was restarted on steroids. Continue prednisone  taper through pulmonary 2025.     Acute on chronic hypoxemic respiratory failure, chronic hypercapnic respiratory failure: She was intubated on 10/28/2023 and extubated on 11/01/2023.  She is tolerating 2 L/min oxygen via nasal cannula. She was strongly encouraged to use BiPAP every night to reduce risk of  recurrent COPD exacerbation or worsening espiratory failure.     Type II DM with severe hyperglycemia, hypoglycemia blood sugar down to 48 on 11/09/2023: Decrease Semglee  from 10 units to 8 units daily.  Discontinue scheduled NovoLog  3 units with meals.  Use NovoLog  as needed for hyperglycemia.  Monitor glucose levels and adjust insulin  as needed. S/p treatment with IV insulin  drip. She may need less insulin  as prednisone  is being tapered off.     1.3 cm right upper lung spiculated nodule: S/p bronchoscopy on 10/20/2023.   Pathology was negative for malignancy and findings more consistent with reactive bronchial cells with acute inflammation. Pulmonologist is planning repeat lung biopsy     General weakness: PT and OT recommended discharge to SNF.          Subjective: Pt seen today awake resting in bed.  She was happy to learn no DVT in her left leg.  Requesting nurse assistance since just had a BM.  No other acute complaints.  Physical Exam: Vitals:   11/09/23 2348 11/10/23 0450 11/10/23 0759 11/10/23 0822  BP: 106/63 112/63  130/65  Pulse: 91 92  99  Resp: 18 18  16   Temp: 98 F (36.7 C) 98 F (36.7 C)  97.9 F (36.6 C)  TempSrc: Oral   Oral  SpO2: 96% 96% 96% 100%  Weight:      Height:       General exam: awake, alert, no acute distress, chronically ill appearaing HEENT: moist mucus membranes, hearing grossly normal  Respiratory system: CTAB, no wheezes, rales or rhonchi, normal respiratory effort. Cardiovascular system: normal S1/S2, RRR, no JVD, murmurs, rubs, gallops, no pedal edema.   Gastrointestinal system: soft, NT, ND, no HSM felt, +bowel sounds. Central nervous system: A&O x2+. no gross focal neurologic deficits, normal speech Skin: dry, intact, normal temperature, no rashes seen on visualized skin Psychiatry: normal mood, congruent affect   Data Reviewed:  Notable labs --  Normal BMP except glucose 165 Normal CBC except Hbg 10.8  U/S doppler LLE -  negative for DVT  Family Communication: None  Disposition: Status is: Inpatient Remains inpatient appropriate because: awaiting SNF placement, auth pending   Planned Discharge Destination: Skilled nursing facility    Time spent: 45 minutes  Author: Burnard DELENA Cunning, DO 11/10/2023 1:44 PM  For on call review [email or web address].

## 2023-11-10 NOTE — Significant Event (Signed)
 Rapid Response Event Note   Reason for Call : AMS, seizure activity   Initial Focused Assessment: Upon arrival to room, patient was minimally responsive to voice. Eyes were deviated up and all extremities had tremors. Patient was able to minimally squeeze hands upon command and attempted to look at this RN. Vitals upon arrival were HR 108, BP 141/90, 96% on 3L [place]. CBG 346. Provider to bedside.        Interventions: Patient became unresponsive to voice and began having snoring respirations with sputum bubbling at mouth. Patient was laid flat and placed on side. Patient suctioned. 2 mg of ativan  administered per verbal order. Tremors stopped. Patient with positive gag/cough and protecting airway. Patient transported to CT with 2 RN's. CT head done. Pt transported back to room. Patient responsive to painful stimuli, presenting post-ictal. Vitals HR 109, BP 112/70, 97% on 3L [place], RR 16.    Plan of Care: As above. Patient transfer to PCU unit.    Event Summary: As above.    MD Notified: Jawo, Modou  Call Time: 19:37 Arrival Time: 19:39 End Time: 20:09  Izetta JONELLE Garfield, RN

## 2023-11-10 NOTE — Progress Notes (Signed)
 Responded to page for rapid response-medical team actively in room-in speaking with staff no family was present at this times but the pt son was regularly here.

## 2023-11-10 NOTE — Progress Notes (Signed)
  Progress Note    11/10/2023 1:42 PM 1 Day Post-Op  Subjective:  Michele House is a 68 yo female who is POD #1 from IVC Filter placement. Patient resting comfortably in bed. Endorses soreness to her right groin. No other complaints overnight. Vitals all remain stable.     Vitals:   11/10/23 0759 11/10/23 0822  BP:  130/65  Pulse:  99  Resp:  16  Temp:  97.9 F (36.6 C)  SpO2: 96% 100%   Physical Exam: Cardiac:  RRR, No murmurs  Lungs:  Bilateral wheezing and diminished in the bases on auscultation. Incisions:  Right groin puncture site with dressing clean dry and intact.  Extremities:  Bilateral lower extremities warm to touch with palpable pulses.  Abdomen:  Positive bowel sounds throughout.  Neurologic: AAOX3, Answers questions and follows commands appropriately.   CBC    Component Value Date/Time   WBC 5.6 11/10/2023 0648   RBC 3.54 (L) 11/10/2023 0648   HGB 10.8 (L) 11/10/2023 0648   HGB 14.0 09/30/2013 0841   HCT 32.6 (L) 11/10/2023 0648   HCT 41.9 09/30/2013 0841   PLT 311 11/10/2023 0648   PLT 261 09/30/2013 0841   MCV 92.1 11/10/2023 0648   MCV 87 09/30/2013 0841   MCH 30.5 11/10/2023 0648   MCHC 33.1 11/10/2023 0648   RDW 16.6 (H) 11/10/2023 0648   RDW 13.4 09/30/2013 0841   LYMPHSABS 0.6 (L) 11/10/2023 0648   MONOABS 0.5 11/10/2023 0648   EOSABS 0.0 11/10/2023 0648   BASOSABS 0.0 11/10/2023 0648    BMET    Component Value Date/Time   NA 140 11/10/2023 0648   NA 136 09/30/2013 0841   K 4.0 11/10/2023 0648   K 3.7 09/30/2013 0841   CL 100 11/10/2023 0648   CL 99 09/30/2013 0841   CO2 32 11/10/2023 0648   CO2 31 09/30/2013 0841   GLUCOSE 165 (H) 11/10/2023 0648   GLUCOSE 207 (H) 09/30/2013 0841   BUN 13 11/10/2023 0648   BUN 27 (H) 09/30/2013 0841   CREATININE 0.55 11/10/2023 0648   CREATININE 1.04 09/30/2013 0841   CALCIUM  8.9 11/10/2023 0648   CALCIUM  9.2 09/30/2013 0841   GFRNONAA >60 11/10/2023 0648   GFRNONAA 59 (L) 09/30/2013 0841    GFRAA >60 08/19/2017 1618   GFRAA >60 09/30/2013 0841    INR    Component Value Date/Time   INR 1.2 10/28/2023 1804   INR 0.8 09/30/2013 0841     Intake/Output Summary (Last 24 hours) at 11/10/2023 1342 Last data filed at 11/10/2023 9051 Gross per 24 hour  Intake 366.25 ml  Output --  Net 366.25 ml     Assessment/Plan:  68 y.o. female is s/p IVC FILTER placement 1 Day Post-Op   PLAN Patient recovering as expected. No complications to note.  Vascular Surgery to sign off.   DVT prophylaxis:  NONE   Tanga Gloor R Nashira Mcglynn [facility] 11/10/2023 1:42 PM

## 2023-11-10 NOTE — Progress Notes (Signed)
 At 1930 RN completed head to toe assessment and status was baseline, no complaints. At 1937 summoned to pt room. When nurse tech was doing her vital signs she noted that pt mental status changed. Upon assessment pt was in bed with HOB elevated 45 degrees, eyes were fixated up and to the right. She was tachypnic and using accessory muscles to breath. On 3L [place]. Rapid response was called. Pt was unable to answer questions or follow commands. Blood sugar at that time was 346.  Pt began convulsing. She was turned onto her right side and given oral suctioning. Pt was administered ativan  IVP per NP order. She was transferred to CT scan. She returned to her room while awaiting a bed on PCU unit (room 256). She has snoring respirations, and is non-responsive. Telemetry on. Report phoned to [facility] at 2050. Provider ordered IV keppra  to be given now, but it was not available on the unit. Phoned the pharmacy, who stated that they would get it ready and tube it up. At the time of transfer at 2050 the medication had not arrived.

## 2023-11-10 NOTE — Progress Notes (Signed)
 NAME:  ESTY AHUJA, MRN:  [id number], DOB:  1956-01-10, LOS: 39 ADMISSION DATE:  10/02/2023   History of Present Illness:  Case of a 68 year old female patient with a past medical history of COPD with chronic respiratory failure on 2 L nasal cannula, hypertension, type 2 diabetes, heart failure with preserved EF who has been here for a month.  Initially presented with altered mental status encephalopathy.  Had an extensive workup including brain MRI which was negative for any acute intracranial abnormality initially.  However unfortunately course complicated by acute CVA with repeat MRI showing multiple acute infarcts in the high bilateral posterior frontal and parietal lobes.  She recovered from that but course further complicated by waxing and waning mental status which prompted an LP to rule out encephalitis and has been unremarkable so far.  She was found to have right upper lobe lung nodule which was concerning for malignancy and underwent robotic assisted navigational bronchoscopy with biopsy on 12/27 which showed very rare atypical cells and reactive alveolar cells mostly.    Pertinent  Medical History  As abnove  Significant Hospital Events: Including procedures, antibiotic start and stop dates in addition to other pertinent events   12/01: Vital stable, respiratory viral panel negative, procalcitonin negative, preliminary blood cultures negative.  Urine cultures are ordered as add-on, ammonia levels mildly elevated at 39, strep pneumo negative, CBG elevated at 244, history of enlarging pulmonary nodule with recommendations for PET/CT during most recent admission which has not been done yet, if video bronchoscopy was scheduled for 10/13/2023 as outpatient. 12/02: Unable to take care of herself and son cannot provide the required care as she required 24-hour supervision due to worsening dementia.  PT is recommending SNF. 12/03: Blood pressure started trending up, restarting home  Zestoretic .  Urine cultures with strep agalactiae, penicillin allergy noted, will continue with ceftriaxone  for now.  Also started on Remeron  for concern of worsening depression and unable to sleep at night  12/05:  Patient has some blurry vision and weakness and pain.  MRI of the brain negative for acute intracranial abnormality. 12/08:  Patient more confused today than yesterday.  Able to follow some simple commands but not as good of a conversation today as yesterday. 12/09:  Patient still confused today.  Does not follow simple commands today. 12/10: Patient more talkative today but was looking up at the ceiling when she was talking with me.  She does not move her extremities to command but does move them on her own.  Physical therapy and Occupational Therapy did not see her move her right arm. 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  Mild associated petechial hemorrhage.  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 12/12: Patient with new stroke, CTA head and neck with no large vessel occlusion.  Patient had a recent echocardiogram which was normal. Concern of paraneoplastic versus hypercoagulability secondary to malignancy.  Patient missed her appointment for bronchoscopy and outpatient appointment for PET scan due to recurrent hospitalizations. 12/13: Patient continued to have waxing and waning mental status, unable to move right upper and lower extremities.  Having some hallucination.  Pulmonary is planning lung biopsy on 12/27 if she remains stable.  Rapidly progressive dementia and now with underlying stroke, question of paraneoplastic encephalitis and hypercoagulability secondary to underlying undiagnosed malignancy. 12/15: Patient with much  improved mentation.  Having some flickering of movements on right  upper and lower extremity today, left extremities weaker but seems improving.   12/18: Pt underwent bronchoscopy and lung biopsy 12/19: patient stable , she is being optimized for dc for rehab.  She's cleared from pulmonary for discharge. Results from bronch will take 1 week.   12/26: Pt transferred to the stepdown unit with acute hypercapnic respiratory requiring Bipap but subsequently required mechanical intubation due to worsening respiratory failure.  Insulin  gtt initiated due to severe hyperglycemia.  With hemorrhagic shock due to bleeding duodenal ulcer, underwent emergent EGD with successful hemostasis by injection of Epinephrine  and hemostatic clip. 12/27: Hgb slowly trending down, no report of bleeding from OG suction nor melena. Weaning down vasopressors, if able to continue to wean vasopressors, then can consider WUA/SBT.  Giving temporizing measures for Hyperkalemia. Self extubated but required reintubation. Hgb continued to trend down, given 1 unit pRBC's and 2 FFP.  IR consulted for consideration for embolization. 12/28: Weaned off Vasopressin , Levophed  weaning down.  S/p IR embolization of the Duodenal artery. Peak pressures and lung sounds improved with Ketamine , will keep intubated today given rapid reintubation yesterday. 12/30- patient on PRVC FiO2 28%, weaning off ketamine  and hoping to perform SBT today.  Holding feeds until post SBT.  11/02/23- patient is stable overnight no acute events, electrllytes improved.  No GI bleeds noted.  Opitimizing for TRH today.  11/03/23 patient is more interactive this am, still has not been able to get OOB.  Will continue to follow her from pulmonary with COPD and lung cancer. Her pathology was sent for second opinion due to mixed reporting from cytotech and pathology report.  11/03/22- patient is good spirits this am. Son came to visit yesterday and we reivewed medical plan. H/h is stable. Following peripherally from pulmonary perspective for COPD and  lung mass.  11/05/23- patient on 3L/min.  She still has 9 day old Right internal jugular central line.  I have placed orders for RN to remove this line today.  She required BIPAP yesterday but states she did not feel SOB>  11/06/23- patient shares she feels well today.  She is on 3L/min but takes it off at times. She reports breathing feels improved 11/07/23- patient with no acute events overnight.  H/h is improved 11/08/23- patient seen at bedside.  Sitting up in bed in no distress denies dyspnea.  She is on nasal canula 2L/min.  She asks if we can help her get glasses and dentures.  I received inconclusive results from lung biopsy. I discussed this with patient.  We discussed repeating the biopsy and patient states she wants this done as soon as possible. She is clinically stable.  Diagnosis Lung, biopsy, Right upper lobe - VERY RARE ATYPICAL CELLS, FAVOR REACTIVE. - FRAGMENTS OF ALVEOLATED LUNG AND BLOOD CLOT. Diagnosis Note Per [facility] recent imaging revealed an enlarging spiculated 1.6 cm right upper lung lesion. This case underwent intradepartmental consultation and Dr. Reed concurs with the interpretation. See concurrent case [id number]. Rexene  11/09/23- patient with acute dvt.  Not good candidate for Antcoagulation per GI due to upper GI bleed with duod ulcer 11/10/23- plant for lung biopsy on 11/12/23- reviewed with patient.  NPO Thursday night at midnight    Objective   Blood pressure 127/70, pulse 97, temperature 98.3 F (36.8 C), resp. rate 16, height 5' (1.524 m), weight 66.1 kg, last menstrual period 12/24/1998, SpO2 98%.        Intake/Output Summary (Last 24  hours) at 11/10/2023 1546 Last data filed at 11/10/2023 1300 Gross per 24 hour  Intake 606.25 ml  Output --  Net 606.25 ml   Filed Weights   11/06/23 0324 11/08/23 0500 11/09/23 1650  Weight: 64.2 kg 66.1 kg 66.1 kg    Examination: General: Awake, follows verbal communication HENT: Supple neck reactive pupils  Lungs: mild  wheezing b/l Cardiovascular: Normal S1, Normal S2, RRR  Abdomen: Soft, non tender, non distended, +BS  Extremities: Warm and well perfused no edema.   Labs and imaging were reviewed.   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  This has been sent for second opinion to be reviewed.  I think its most likely primary lung cancer.  Have discussed repeat lung biopsy and patient is agreeable to do this.    Best Practice (right click and Reselect all SmartList Selections daily)   Diet/type: NPO DVT prophylaxis SCD Pressure ulcer(s): N/A GI prophylaxis: PPI Lines: Central line Foley:  Yes, and it is still needed Code Status:  full code     Jamarri Vuncannon, M.D.  Pulmonary & Critical Care Medicine

## 2023-11-11 ENCOUNTER — Ambulatory Visit: Payer: 59

## 2023-11-11 ENCOUNTER — Inpatient Hospital Stay: Payer: 59

## 2023-11-11 DIAGNOSIS — G9341 Metabolic encephalopathy: Secondary | ICD-10-CM | POA: Diagnosis not present

## 2023-11-11 DIAGNOSIS — R569 Unspecified convulsions: Secondary | ICD-10-CM | POA: Diagnosis not present

## 2023-11-11 LAB — GLUCOSE, CAPILLARY
Glucose-Capillary: 151 mg/dL — ABNORMAL HIGH (ref 70–99)
Glucose-Capillary: 186 mg/dL — ABNORMAL HIGH (ref 70–99)
Glucose-Capillary: 178 mg/dL — ABNORMAL HIGH (ref 70–99)
Glucose-Capillary: 182 mg/dL — ABNORMAL HIGH (ref 70–99)

## 2023-11-11 MED ORDER — PANTOPRAZOLE SODIUM 40 MG IV SOLR
40.0000 mg | Freq: Two times a day (BID) | INTRAVENOUS | Status: DC
  Administered 2023-11-11 – 2023-11-15 (×11): 40 mg via INTRAVENOUS
  Filled 2023-11-11 (×11): qty 10

## 2023-11-11 NOTE — Progress Notes (Addendum)
 NEUROLOGY FOLLOW UP NOTE   Date of service: November 11, 2023 Patient Name: Michele House MRN:  [id number] DOB:  11/22/1955  Brief HPI  Michele House is a 68 y.o. female  has a past medical history of Acid reflux, COPD (chronic obstructive pulmonary disease) (HCC), Diabetes mellitus without complication (HCC), Hyperlipidemia, Hypertension, and Thyroid  disease. who was admitted to the hospital on 11/30 for multifactorial encephalopathy in the setting of decompensated respiratory failure with hypoxia, pneumonia, COPD exacerbation and UTI. Neurology saw the patient initially on 12/5 with presumptive diagnosis of autoimmune encephalopathy. Her LP results were bland with no pleiocytosis. Began solumedrol 12/8 for empiric tx of autoimmune encephalitis. Her mental status was remarkably improved after steroids on follow up Neurology assessment 12/15. Comprehensive autoimmune encephalopathy panel on her CSF came back negative. EEGs showed diffuse slowing indicative of global cerebral dysfunction; epileptiform abnormalities were not seen.    Interval Hx/subjective  Neurology called back to see after she had a seizure yesterday night. She states that she has a remote history of seizures with last seizure over 30 years ago, formerly treated with anticonvulsant.   Vitals   Vitals:   11/11/23 0630 11/11/23 0849 11/11/23 1715 11/11/23 1927  BP: (!) 87/55 117/65 120/69 123/73  Pulse: 84 89 93 89  Resp: 11 16 16 18   Temp:  (!) 97.4 F (36.3 C) 97.7 F (36.5 C) (!) 97.5 F (36.4 C)  TempSrc:    Axillary  SpO2: 98% 99% 100% 97%  Weight:      Height:         Body mass index is 27.08 kg/m.  Physical Exam   Constitutional: Appears well-developed and well-nourished.  Eyes: No scleral injection.  Head: Normocephalic.  Respiratory: Effort normal, non-labored breathing.    Neurologic Examination   Ment: Awake and alert. Speech is fluent with intact comprehension and naming. Fully oriented to  place and time.  CN: EOMI. PERRL. Smile symmetric Motor:  LUE 5/5 LLE 5/5 RUE with 2/5 grip to command; otherwise with no movement to command or stimulation, 0/5 proximally RLE 4/5 Sensory: Intact to FT x 4 subjectively Cerebellar: No ataxia with LUE movements. Unable to assess RUE due to severe weakness Gait: Deferred  Medications  Current Facility-Administered Medications:    acetaminophen  (TYLENOL ) tablet 650 mg, 650 mg, Oral, Q6H PRN, Ayiku, Bernard, MD, 650 mg at 11/07/23 1111   arformoterol  (BROVANA ) nebulizer solution 15 mcg, 15 mcg, Nebulization, BID, Ayiku, Bernard, MD, 15 mcg at 11/11/23 1803   bisacodyl  (DULCOLAX) suppository 10 mg, 10 mg, Rectal, Daily PRN, Ayiku, Bernard, MD, 10 mg at 10/25/23 1501   dextromethorphan -guaiFENesin  (MUCINEX  DM) 30-600 MG per 12 hr tablet 1 tablet, 1 tablet, Oral, BID PRN, Ayiku, Bernard, MD, 1 tablet at 10/27/23 2109   docusate sodium  (COLACE) capsule 100 mg, 100 mg, Oral, BID, Ayiku, Bernard, MD, 100 mg at 11/10/23 0913   furosemide  (LASIX ) tablet 20 mg, 20 mg, Oral, Daily, Dew, Jason S, MD, 20 mg at 11/11/23 1002   hydrOXYzine  (ATARAX ) tablet 25 mg, 25 mg, Oral, TID PRN, Ayiku, Bernard, MD, 25 mg at 11/05/23 2217   insulin  aspart (novoLOG ) injection 0-5 Units, 0-5 Units, Subcutaneous, QHS, Dew, Jason S, MD, 3 Units at 11/10/23 2214   insulin  aspart (novoLOG ) injection 0-9 Units, 0-9 Units, Subcutaneous, TID WC, Dew, Jason S, MD, 2 Units at 11/11/23 1734   insulin  glargine-yfgn (SEMGLEE ) injection 8 Units, 8 Units, Subcutaneous, Daily, Marea Selinda RAMAN, MD, 8 Units at 11/11/23 [phone]  ipratropium-albuterol  (DUONEB) 0.5-2.5 (3) MG/3ML nebulizer solution 3 mL, 3 mL, Nebulization, Q6H PRN, Jens Durand, MD   ipratropium-albuterol  (DUONEB) 0.5-2.5 (3) MG/3ML nebulizer solution 3 mL, 3 mL, Nebulization, BID, Dew, Selinda RAMAN, MD, 3 mL at 11/11/23 1803   levETIRAcetam  (KEPPRA ) IVPB 500 mg/100 mL premix, 500 mg, Intravenous, Q12H, Jawo, Modou L, NP, Last Rate:  400 mL/hr at 11/11/23 0957, 500 mg at 11/11/23 0957   levothyroxine  (SYNTHROID ) tablet 100 mcg, 100 mcg, Oral, Q0600, Jens Durand, MD, 100 mcg at 11/09/23 0539   LORazepam  (ATIVAN ) injection 2 mg, 2 mg, Intravenous, Once, Jawo, Modou L, NP   menthol -cetylpyridinium (CEPACOL) lozenge 3 mg, 1 lozenge, Oral, PRN, Ayiku, Bernard, MD, 3 mg at 10/22/23 9395   morphine  (PF) 2 MG/ML injection 2 mg, 2 mg, Intravenous, Once, Dew, Selinda RAMAN, MD   multivitamin with minerals tablet 1 tablet, 1 tablet, Oral, Daily, Jens Durand, MD, 1 tablet at 11/11/23 1002   ondansetron  (ZOFRAN ) injection 4 mg, 4 mg, Intravenous, Q6H PRN, Jens Durand, MD, 4 mg at 10/24/23 2119   Oral care mouth rinse, 15 mL, Mouth Rinse, BID, Jens Durand, MD, 15 mL at 11/11/23 0959   pantoprazole  (PROTONIX ) injection 40 mg, 40 mg, Intravenous, Q12H, Jawo, Modou L, NP, 40 mg at 11/11/23 1000   polyethylene glycol (MIRALAX  / GLYCOLAX ) packet 17 g, 17 g, Oral, Daily, Jens Durand, MD, 17 g at 11/11/23 1001   Racepinephrine HCl 2.25 % nebulizer solution 0.5 mL, 0.5 mL, Nebulization, Q4H PRN, Jens Durand, MD, 0.5 mL at 10/29/23 1500   rOPINIRole  (REQUIP  XL) 24 hr tablet 4 mg, 4 mg, Oral, QHS, Jens Durand, MD, 4 mg at 11/09/23 2058   senna-docusate (Senokot-S) tablet 1 tablet, 1 tablet, Oral, Daily, Jens Durand, MD, 1 tablet at 11/11/23 1002   sertraline  (ZOLOFT ) tablet 50 mg, 50 mg, Oral, Daily, Jens Durand, MD, 50 mg at 11/11/23 1002   sodium chloride  (OCEAN) 0.65 % nasal spray 1 spray, 1 spray, Each Nare, PRN, Jens Durand, MD, 1 spray at 11/03/23 1935 Labs and Diagnostic Imaging   CBC:  Recent Labs  Lab 11/10/23 0648 11/10/23 1955  WBC 5.6 11.5*  NEUTROABS 4.5  --   HGB 10.8* 11.0*  HCT 32.6* 34.0*  MCV 92.1 91.9  PLT 311 435*    Basic Metabolic Panel:  Lab Results  Component Value Date   NA 137 11/10/2023   K 3.9 11/10/2023   CO2 26 11/10/2023   GLUCOSE 383 (H) 11/10/2023   BUN 15 11/10/2023    CREATININE 0.74 11/10/2023   CALCIUM  8.9 11/10/2023   GFRNONAA >60 11/10/2023   GFRAA >60 08/19/2017   Lipid Panel:  Lab Results  Component Value Date   LDLCALC 38 10/14/2023   HgbA1c:  Lab Results  Component Value Date   HGBA1C 8.4 (H) 10/15/2023   Urine Drug Screen:     Component Value Date/Time   LABOPIA NONE DETECTED 10/02/2023 0458   COCAINSCRNUR NONE DETECTED 10/02/2023 0458   LABBENZ NONE DETECTED 10/02/2023 0458   AMPHETMU NONE DETECTED 10/02/2023 0458   THCU NONE DETECTED 10/02/2023 0458   LABBARB NONE DETECTED 10/02/2023 0458    Alcohol Level No results found for: [facility] INR  Lab Results  Component Value Date   INR 1.2 10/28/2023   APTT  Lab Results  Component Value Date   APTT 26 10/28/2023   AED levels: No results found for: PHENYTOIN, ZONISAMIDE, LAMOTRIGINE, LEVETIRACETA    Assessment  SHAUNTAVIA BRACKIN is a 68 y.o. female  has a past medical history of Acid reflux, COPD (chronic obstructive pulmonary disease) (HCC), Diabetes mellitus without complication (HCC), Hyperlipidemia, Hypertension, and Thyroid  disease. who was admitted to the hospital on 11/30 for multifactorial encephalopathy in the setting of decompensated respiratory failure with hypoxia, pneumonia, COPD exacerbation and UTI. Neurology saw the patient initially on 12/5 with presumptive diagnosis of autoimmune encephalopathy. Her LP results were bland with no pleiocytosis. Began solumedrol 12/8 for empiric tx of autoimmune encephalitis. Her mental status was remarkably improved after steroids on follow up Neurology assessment 12/15. Comprehensive autoimmune encephalopathy panel on her CSF came back negative. EEGs earlier in her stay showed diffuse slowing indicative of global cerebral dysfunction; epileptiform abnormalities were not seen. MRI on 12/11 came back positive for multiple acute infarcts.  - Neurology called back to see her today (11/11/23) after she had a seizure yesterday night. She  states that she has a remote history of seizures with last seizure over 30 years ago, formerly treated with anticonvulsant.  - She was loaded with 2000 mg IV Keppra  yesterday night. Keppra  is being continued at 500 mg IV BID. No further seizures.  - Exam reveals an awake and alert patient who is fully oriented without aphasia or agitation. Right upper and lower extremity weakness is noted. Findings on today's exam are not significantly changed since 12/15 exam by Dr. Voncile, with flaccid right upper extremity and nearly flaccid right lower extremity being noted at that time.    Recommendations  - Repeat EEG has been ordered. Unable to obtain on initial attempt due to low BP with RN needing to change IV prior to EEG. Will re-attempt later.  - Continue Keppra  at 500 mg IV BID. Switch to PO when able.  - Neurology will follow up on EEG results  Addendum: - EEG:  Continuous slow, generalized. This study is suggestive of moderate diffuse encephalopathy. No seizures or epileptiform discharges were seen throughout the recording - Neurology will follow PRN. Please call if there are additional questions.  ______________________________________________________________________   Bonney SHARK, Kristoph Sattler, MD [facility]

## 2023-11-11 NOTE — Progress Notes (Addendum)
 Progress Note   Patient: Michele House FMW:[id number] DOB: [birth year] DOA: 10/02/2023     40 DOS: the patient was seen and examined on 11/11/2023   Brief hospital course:  AZELIA REIGER is a 68 y.o. female with medical history significant of COPD, chronic respiratory failure on 2 L, hypertension, type 2 diabetes, HFpEF, recent discharge from the hospital 24th November 2024 after hospitalization for COPD exacerbation complicated by respiratory failure.  She presented to the hospital again on 10/02/2023 with chest pain, malaise, cough, shortness of breath, abdominal pain and dysuria.   She was admitted to the hospital for COPD exacerbation, acute on chronic hypoxemic respiratory failure, UTI and pneumonia.  She later developed worsening confusions, MRI brain revealed multiple acute strokes.   Prolonged hospital admission complicated by several issues including hemorrhagic shock requiring transfer to ICU for pressors and GI consultation. EGD showed duodenal ulcer/s that were treated with clipped but later required embolization with IR.  Pt also found with RLE DVT status post IVC filter placement given bleeding anticoagulation contraindicated.  For detailed hospital course and events, see [facility] progress note by Dr. Jens of 11/09/23.  Further hospital course and management as outlined below.   Assessment and Plan:  Acute metabolic encephalopathy: Improved. Initially completed 5 days of high-dose several IV Solu-Medrol  on 10/12/2023 Differential diagnosis include autoimmune encephalitis     Acute stroke with right hemiplegia: Off of Plavix  because of recent acute GI bleeding.   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   New Onset Seizure-like Activity - evening of 11/10/23 - see RN and cross  coverage notes --Neurology following - follow up on recommendations --Pt loaded with IV Keppra  last night --Continue PO Keppra  500 mg BID --EEG today with moderate diffuse encephalopathy --Seizure precautions --PRN Ativan  if seizure activity     Acute GI bleeding, bleeding duodenal ulcer:  S/p EGD on 10/28/2023 with clip placed.   S/p IR embolization on 10/30/2023.   --Continue Protonix . --Monitor CBC --Transfuse if Hbg < 7 or less than 8 with active bleeding or hemodynamic instability     Lower extremity pain, more severe in the left, right peroneal DVT: Patient complaining of pain in the lower extremities 1/7.  Initially refused ultrasound of the left lower extremity because of severe pain.  Ultrasound of the right lower extremity showed right calf DVT.  LLE U/S obtained 1/8 after pt agreed - no LLE DVT was seen. Due to high risk of recurrent GI bleeding given recent massive GI bleed requiring embolization by IR, unable to treat with anticoagulation. Vascular surgery placed IVC filter on 11/09/23     Hemorrhagic shock: Resolved Hypotension: Resolved.  Off of vasopressors    Acute blood loss anemia: H&H is stable.  S/p transfusion with 2 units of PRBCs on 10/28/2023, 1 unit of PRBCs on 10/29/2023.  S/p transfusion with 2 units of platelet pheresis on 10/29/2023. H&H is stable.     Pneumonia: Initially completed 5 days of IV ceftriaxone  and azithromycin  on 10/07/2023. She was restarted on broad-spectrum antibiotics (vancomycin , ceftriaxone  and Flagyl ) on 10/28/2023 when she decompensated requiring transfer to the ICU. Acute UTI: Urine culture showed strep agalactiae.  Completed antibiotics.     Acute on chronic diastolic CHF: Stable.  S/p treatment with IV Lasix .  Continue oral Lasix  Anxiety/panic attacks: Ativan  as needed.  Continue sertraline   COPD exacerbation: This had previously resolved.  However, she had recurrent COPD exacerbation and was restarted on  steroids. Continue prednisone  taper through pulmonary 2025.     Acute on chronic hypoxemic respiratory failure, chronic hypercapnic respiratory failure: She was intubated on 10/28/2023 and extubated on 11/01/2023. She is tolerating 2 L/min oxygen via nasal cannula. She was strongly encouraged to use BiPAP every night to reduce risk of recurrent COPD exacerbation or worsening espiratory failure.     Type II DM with severe hyperglycemia, hypoglycemia blood sugar down to 48 on 11/09/2023: Decrease Semglee  from 10 units to 8 units daily.  Discontinue scheduled NovoLog  3 units with meals.  Use NovoLog  as needed for hyperglycemia.  Monitor glucose levels and adjust insulin  as needed. S/p treatment with IV insulin  drip. She may need less insulin  as prednisone  is being tapered off.     1.3 cm right upper lung spiculated nodule: S/p bronchoscopy on 10/20/2023.   Pathology was negative for malignancy and findings more consistent with reactive bronchial cells with acute inflammation. Pulmonologist is planning repeat lung biopsy     General weakness: PT and OT recommended discharge to SNF.          Subjective: Pt seen resting in bed awake but very drowsy and fell asleep several times while talking to me.  She was not able to answer my questions, she would start to speak, then fall asleep mid-sentence.  Overnight events of seizure-like activity noted and pt had EEG today.   Physical Exam: Vitals:   11/11/23 0530 11/11/23 0600 11/11/23 0630 11/11/23 0849  BP: (!) 86/58 (!) 94/53 (!) 87/55 117/65  Pulse: 89 88 84 89  Resp: 14 13 11 16   Temp:    (!) 97.4 F (36.3 C)  TempSrc:      SpO2: 97% 99% 98% 99%  Weight:      Height:       General exam: awake, drowsy and falls to sleep while talking to me, no acute distress, chronically ill appearaing HEENT: moist mucus membranes, hearing grossly normal  Respiratory system: lungs clear but severely diminished throughtout, normal respiratory  effort. Cardiovascular system: normal S1/S2, RRR, no peripheral edema Gastrointestinal system: soft, NT, ND, no HSM felt, +bowel sounds. Central nervous system: A&O x2+. no gross focal neurologic deficits, normal speech Skin: dry, intact, normal temperature, no rashes seen on visualized skin Psychiatry: normal mood, congruent affect   Data Reviewed:  Notable labs --  Normal BMP except glucose 383 Normal CBC except WBC 11.5 (reactive after seizure likely), Hbg stable 11.0, platelets 435k Lactic acid 5 >> 1.4 on repeat overnight after seizure like activity  CBG's today 267 >> 151 >> 186  EEG -- no epileptic activity seen, showed moderate diffuse encephalopathy  U/S doppler LLE - negative for DVT     Family Communication: daughter updated by phone this afternoon   Disposition: Status is: Inpatient  Remains inpatient appropriate because: new onset seizures last night with evaluation underway. Needs SNF placement when medically ready again    Planned Discharge Destination: Skilled nursing facility    Time spent: 43 minutes  Author: Burnard DELENA Cunning, DO 11/11/2023 2:55 PM  For on call review [email or web address].

## 2023-11-11 NOTE — Progress Notes (Signed)
 Eeg done

## 2023-11-11 NOTE — TOC Progression Note (Addendum)
 Transition of Care [facility]) - Progression Note    Patient Details  Name: Michele House MRN: [id number] Date of Birth: 1956-05-13  Transition of [facility]) CM/SW Contact  Tomasa JAYSON Childes, RN Phone Number: 11/11/2023, 11:06 AM  Clinical Narrative:    Retrieved message from [place] from [facility] and [place]. Patient does not meet medical necessity for SNF. A peer to peer is being offered by [facility] medical director. Attending MD notified to call [phone], option 5 by 3pm EST today.    11:41am Per MD patient had new onset of seizures last night. Neurology being consulted.   11:50am Spoke with Ronal from [facility] @ [phone] SNF auth withdrawn for patient not meeting medical readiness at this time.    Expected Discharge Plan: Skilled Nursing Facility Barriers to Discharge: Continued Medical Work up  Expected Discharge Plan and Services                                               Social Determinants of Health (SDOH) Interventions SDOH Screenings   Food Insecurity: Patient Unable To Answer (10/03/2023)  Recent Concern: Food Insecurity - Food Insecurity Present (09/24/2023)  Housing: High Risk (10/03/2023)  Transportation Needs: Patient Unable To Answer (10/03/2023)  Recent Concern: Transportation Needs - Unmet Transportation Needs (09/24/2023)  Utilities: Patient Unable To Answer (10/03/2023)  Social Connections: Patient Declined (11/02/2023)  Tobacco Use: Medium Risk (11/09/2023)    Readmission Risk Interventions     No data to display

## 2023-11-11 NOTE — Progress Notes (Signed)
 Nutrition Follow-up  DOCUMENTATION CODES:   Not applicable  INTERVENTION:   -Continue MVI with minerals daily -Continue Magic cup TID with meals, each supplement provides 290 kcal and 9 grams of protein   NUTRITION DIAGNOSIS:   Inadequate oral intake related to inability to eat (pt sedated and ventilated) as evidenced by NPO status.  Ongoing  GOAL:   Patient will meet greater than or equal to 90% of their needs  Progressing   MONITOR:   PO intake, Supplement acceptance, Diet advancement  REASON FOR ASSESSMENT:   Ventilator    ASSESSMENT:   68 y/o female with h/o HTN, HLD, COPD, CHF, thyroid  disease, lung nodule and GERD who is admitted with COPD/asthma exacerbation in the setting of metapneumovirus infection, acute anemia with hemorrhagic shock secondary to duodenal ulcer s/p EGD with clip and Injection 10/28/2023 and s/p IR Embolization on 10/30/2023, AKI and acute CVA with right sided hemiparesis.  12/30- extubated, advanced to a dysphagia 3 diet  1/2- s/p BSE- advanced to regular diet with thin liquids 1/7- s/p IVC filter placement 1/9- s/p EEG- moderate diffuse encephalopathy  Reviewed I/O's: +1.1 L x 24 hours and +361 ml since 10/28/23  Pt now with new onset seizure activity; neurology following and work-up pending. Per TOC notes, insurance authorization on hold until pt is medically ready.   Pt out of room at time of visit. No family present.   Pt on regular diet with improved oral intake. Meal completions 75-100%.   Medications reviewed and include colace, lasix , mirlax, ativan , senokot, and keppra .   Labs reviewed: CBGS: 151-351 (inpatient orders for glycemic control are 0-9 units insulin  aspart TID with meals and 8 units insulin  glargine-ygfn daily).    Diet Order:   Diet Order             Diet NPO time specified  Diet effective midnight           Diet regular Room service appropriate? Yes; Fluid consistency: Thin  Diet effective now                    EDUCATION NEEDS:   No education needs have been identified at this time  Skin:  Skin Assessment: Reviewed RN Assessment  Last BM:  11/08/23 (type 5)  Height:   Ht Readings from Last 1 Encounters:  11/09/23 5' (1.524 m)    Weight:   Wt Readings from Last 1 Encounters:  11/11/23 62.9 kg    Ideal Body Weight:  45.45 kg  BMI:  Body mass index is 27.08 kg/m.  Estimated Nutritional Needs:   Kcal:  1600-1800  Protein:  85-100 grams  Fluid:  > 1.6 L    Margery ORN, RD, LDN, CDCES Registered Dietitian III Certified Diabetes Care and Education Specialist If unable to reach this RD, please use RD Inpatient group chat on secure chat between hours of 8am-4 pm daily

## 2023-11-11 NOTE — Procedures (Signed)
 Patient Name: Michele House  MRN: [id number]  Epilepsy Attending: Arlin MALVA Krebs  Referring Physician/Provider: Merrianne Locus, MD  Date: 11/11/2023 Duration: 36.59 mins  Patient history: 68 year old F with seizure like activity getting eeg to evaluate for seizure  Level of alertness: Awake, asleep  AEDs during EEG study: LEV  Technical aspects: This EEG study was done with scalp electrodes positioned according to the 10-20 International system of electrode placement. Electrical activity was reviewed with band pass filter of 1-70Hz , sensitivity of 7 uV/mm, display speed of 55mm/sec with a 60Hz  notched filter applied as appropriate. EEG data were recorded continuously and digitally stored.  Video monitoring was available and reviewed as appropriate.  Description: No clear posterior dominant rhythm was seen. Sleep was characterized by vertex waves, sleep spindles (12 to 14 Hz), maximal frontocentral region. EEG showed continuous generalized predominantly 5 to 7 Hz theta slowing. Hyperventilation and photic stimulation were not performed.      ABNORMALITY - Continuous slow, generalized   IMPRESSION: This study is suggestive of moderate diffuse encephalopathy. No seizures or epileptiform discharges were seen throughout the recording  [name] O Shaylene Paganelli

## 2023-11-11 NOTE — Inpatient Diabetes Management (Signed)
 Inpatient Diabetes Program Recommendations  AACE/ADA: New Consensus Statement on Inpatient Glycemic Control   Target Ranges:  Prepandial:   less than 140 mg/dL      Peak postprandial:   less than 180 mg/dL (1-2 hours)      Critically ill patients:  140 - 180 mg/dL    Latest Reference Range & Units 11/10/23 08:23 11/10/23 11:44 11/10/23 15:32 11/10/23 19:38 11/10/23 21:28  Glucose-Capillary 70 - 99 mg/dL 808 (H) 671 (H) 647 (H) 346 (H) 267 (H)   Review of Glycemic Control  Diabetes history: DM2 Outpatient Diabetes medications: Glipizide  10 mg daily, Tradjenta  5 mg daily Current orders for Inpatient glycemic control: Semglee  8 units daily, Novolog  0-9 units TID with meals, Novolog  0-5 units at bedtime; Prednisone  10 mg QAM  Inpatient Diabetes Program Recommendations:    Insulin : CBG ranged from 191-352 mg/dl on 06/02/73.  If steroids are continued, please consider ordering Novolog  4 units TID with meals for meal coverage if patient eats at least 50% of meals.  Thanks, Earnie Gainer, RN, MSN, CDCES Diabetes Coordinator Inpatient Diabetes Program [phone] (Team Pager from 8am to 5pm)

## 2023-11-11 NOTE — Plan of Care (Signed)

## 2023-11-11 NOTE — Progress Notes (Signed)
 NAME:  Michele House, MRN:  [id number], DOB:  Nov 14, 1955, LOS: 40 ADMISSION DATE:  10/02/2023   History of Present Illness:  Case of a 68 year old female patient with a past medical history of COPD with chronic respiratory failure on 2 L nasal cannula, hypertension, type 2 diabetes, heart failure with preserved EF who has been here for a month.  Initially presented with altered mental status encephalopathy.  Had an extensive workup including brain MRI which was negative for any acute intracranial abnormality initially.  However unfortunately course complicated by acute CVA with repeat MRI showing multiple acute infarcts in the high bilateral posterior frontal and parietal lobes.  She recovered from that but course further complicated by waxing and waning mental status which prompted an LP to rule out encephalitis and has been unremarkable so far.  She was found to have right upper lobe lung nodule which was concerning for malignancy and underwent robotic assisted navigational bronchoscopy with biopsy on 12/27 which showed very rare atypical cells and reactive alveolar cells mostly.    Pertinent  Medical History  As abnove  Significant Hospital Events: Including procedures, antibiotic start and stop dates in addition to other pertinent events   12/01: Vital stable, respiratory viral panel negative, procalcitonin negative, preliminary blood cultures negative.  Urine cultures are ordered as add-on, ammonia levels mildly elevated at 39, strep pneumo negative, CBG elevated at 244, history of enlarging pulmonary nodule with recommendations for PET/CT during most recent admission which has not been done yet, if video bronchoscopy was scheduled for 10/13/2023 as outpatient. 12/02: Unable to take care of herself and son cannot provide the required care as she required 24-hour supervision due to worsening dementia.  PT is recommending SNF. 12/03: Blood pressure started trending up, restarting home  Zestoretic .  Urine cultures with strep agalactiae, penicillin allergy noted, will continue with ceftriaxone  for now.  Also started on Remeron  for concern of worsening depression and unable to sleep at night  12/05:  Patient has some blurry vision and weakness and pain.  MRI of the brain negative for acute intracranial abnormality. 12/08:  Patient more confused today than yesterday.  Able to follow some simple commands but not as good of a conversation today as yesterday. 12/09:  Patient still confused today.  Does not follow simple commands today. 12/10: Patient more talkative today but was looking up at the ceiling when she was talking with me.  She does not move her extremities to command but does move them on her own.  Physical therapy and Occupational Therapy did not see her move her right arm. 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  Mild associated petechial hemorrhage.  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 12/12: Patient with new stroke, CTA head and neck with no large vessel occlusion.  Patient had a recent echocardiogram which was normal. Concern of paraneoplastic versus hypercoagulability secondary to malignancy.  Patient missed her appointment for bronchoscopy and outpatient appointment for PET scan due to recurrent hospitalizations. 12/13: Patient continued to have waxing and waning mental status, unable to move right upper and lower extremities.  Having some hallucination.  Pulmonary is planning lung biopsy on 12/27 if she remains stable.  Rapidly progressive dementia and now with underlying stroke, question of paraneoplastic encephalitis and hypercoagulability secondary to underlying undiagnosed malignancy. 12/15: Patient with much  improved mentation.  Having some flickering of movements on right  upper and lower extremity today, left extremities weaker but seems improving.   12/18: Pt underwent bronchoscopy and lung biopsy 12/19: patient stable , she is being optimized for dc for rehab.  She's cleared from pulmonary for discharge. Results from bronch will take 1 week.   12/26: Pt transferred to the stepdown unit with acute hypercapnic respiratory requiring Bipap but subsequently required mechanical intubation due to worsening respiratory failure.  Insulin  gtt initiated due to severe hyperglycemia.  With hemorrhagic shock due to bleeding duodenal ulcer, underwent emergent EGD with successful hemostasis by injection of Epinephrine  and hemostatic clip. 12/27: Hgb slowly trending down, no report of bleeding from OG suction nor melena. Weaning down vasopressors, if able to continue to wean vasopressors, then can consider WUA/SBT.  Giving temporizing measures for Hyperkalemia. Self extubated but required reintubation. Hgb continued to trend down, given 1 unit pRBC's and 2 FFP.  IR consulted for consideration for embolization. 12/28: Weaned off Vasopressin , Levophed  weaning down.  S/p IR embolization of the Duodenal artery. Peak pressures and lung sounds improved with Ketamine , will keep intubated today given rapid reintubation yesterday. 12/30- patient on PRVC FiO2 28%, weaning off ketamine  and hoping to perform SBT today.  Holding feeds until post SBT.  11/02/23- patient is stable overnight no acute events, electrllytes improved.  No GI bleeds noted.  Opitimizing for TRH today.  11/03/23 patient is more interactive this am, still has not been able to get OOB.  Will continue to follow her from pulmonary with COPD and lung cancer. Her pathology was sent for second opinion due to mixed reporting from cytotech and pathology report.  11/03/22- patient is good spirits this am. Son came to visit yesterday and we reivewed medical plan. H/h is stable. Following peripherally from pulmonary perspective for COPD and  lung mass.  11/05/23- patient on 3L/min.  She still has 9 day old Right internal jugular central line.  I have placed orders for RN to remove this line today.  She required BIPAP yesterday but states she did not feel SOB>  11/06/23- patient shares she feels well today.  She is on 3L/min but takes it off at times. She reports breathing feels improved 11/07/23- patient with no acute events overnight.  H/h is improved 11/08/23- patient seen at bedside.  Sitting up in bed in no distress denies dyspnea.  She is on nasal canula 2L/min.  She asks if we can help her get glasses and dentures.  I received inconclusive results from lung biopsy. I discussed this with patient.  We discussed repeating the biopsy and patient states she wants this done as soon as possible. She is clinically stable.  Diagnosis Lung, biopsy, Right upper lobe - VERY RARE ATYPICAL CELLS, FAVOR REACTIVE. - FRAGMENTS OF ALVEOLATED LUNG AND BLOOD CLOT. Diagnosis Note Per [facility] recent imaging revealed an enlarging spiculated 1.6 cm right upper lung lesion. This case underwent intradepartmental consultation and Dr. Reed concurs with the interpretation. See concurrent case [id number]. Rexene  11/09/23- patient with acute dvt.  Not good candidate for Antcoagulation per GI due to upper GI bleed with duod ulcer 11/10/23- plant for lung biopsy on 11/12/23- reviewed with patient.  NPO Thursday night at midnight  11/11/23- I evaluated patient at bedside, she reports no acute events.  We discussed lung biopsy she is agreeable and wishes to proceed.  She asked if I can help her get glasses and new dentures and I did discuss this with  her son  Objective   Blood pressure (!) 87/55, pulse 84, temperature (!) 96.9 F (36.1 C), temperature source Axillary, resp. rate 11, height 5' (1.524 m), weight 62.9 kg, last menstrual period 12/24/1998, SpO2 98%.        Intake/Output Summary (Last 24 hours) at 11/11/2023 0759 Last data filed at 11/11/2023 0105 Gross per  24 hour  Intake 1140 ml  Output --  Net 1140 ml   Filed Weights   11/09/23 1650 11/10/23 2340 11/11/23 0415  Weight: 66.1 kg 62.6 kg 62.9 kg    Examination: General: Awake, follows verbal communication HENT: Supple neck reactive pupils  Lungs: mild wheezing b/l Cardiovascular: Normal S1, Normal S2, RRR  Abdomen: Soft, non tender, non distended, +BS  Extremities: Warm and well perfused no edema.   Labs and imaging were reviewed.   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  This has been sent for second opinion to be reviewed.  I think its most likely primary lung cancer.  Have discussed repeat lung biopsy and patient is agreeable to do this.    Best Practice (right click and Reselect all SmartList Selections daily)   Diet/type: NPO DVT prophylaxis SCD Pressure ulcer(s): N/A GI prophylaxis: PPI Lines: Central line Foley:  Yes, and it is still needed Code Status:  full code     Crystall Donaldson, M.D.  Pulmonary & Critical Care Medicine

## 2023-11-12 ENCOUNTER — Inpatient Hospital Stay: Payer: 59

## 2023-11-12 ENCOUNTER — Encounter: Payer: Self-pay | Admitting: Family Medicine

## 2023-11-12 ENCOUNTER — Other Ambulatory Visit: Payer: Self-pay | Admitting: Pulmonary Disease

## 2023-11-12 ENCOUNTER — Inpatient Hospital Stay: Payer: 59 | Admitting: Certified Registered"

## 2023-11-12 ENCOUNTER — Encounter
Admission: EM | Disposition: A | Discharge status: Skilled Nursing Facility | Payer: Self-pay | Source: Home / Self Care | Attending: Internal Medicine

## 2023-11-12 DIAGNOSIS — G9341 Metabolic encephalopathy: Secondary | ICD-10-CM | POA: Diagnosis not present

## 2023-11-12 HISTORY — PX: VIDEO BRONCHOSCOPY WITH ENDOBRONCHIAL NAVIGATION: SHX6175

## 2023-11-12 HISTORY — PX: FLEXIBLE BRONCHOSCOPY: SHX5094

## 2023-11-12 LAB — CBC
HCT: 28 % — ABNORMAL LOW (ref 36.0–46.0)
Hemoglobin: 9.2 g/dL — ABNORMAL LOW (ref 12.0–15.0)
MCH: 29.8 pg (ref 26.0–34.0)
MCHC: 32.9 g/dL (ref 30.0–36.0)
MCV: 90.6 fL (ref 80.0–100.0)
Platelets: 279 10*3/uL (ref 150–400)
RBC: 3.09 MIL/uL — ABNORMAL LOW (ref 3.87–5.11)
RDW: 16.4 % — ABNORMAL HIGH (ref 11.5–15.5)
WBC: 5.6 10*3/uL (ref 4.0–10.5)
nRBC: 0 % (ref 0.0–0.2)

## 2023-11-12 LAB — GLUCOSE, CAPILLARY
Glucose-Capillary: 177 mg/dL — ABNORMAL HIGH (ref 70–99)
Glucose-Capillary: 165 mg/dL — ABNORMAL HIGH (ref 70–99)
Glucose-Capillary: 124 mg/dL — ABNORMAL HIGH (ref 70–99)
Glucose-Capillary: 208 mg/dL — ABNORMAL HIGH (ref 70–99)

## 2023-11-12 SURGERY — VIDEO BRONCHOSCOPY WITH ENDOBRONCHIAL NAVIGATION
Anesthesia: General | Laterality: Right

## 2023-11-12 MED ORDER — FENTANYL CITRATE (PF) 100 MCG/2ML IJ SOLN
INTRAMUSCULAR | Status: AC
  Filled 2023-11-12: qty 2

## 2023-11-12 MED ORDER — LIDOCAINE HCL (PF) 2 % IJ SOLN
INTRAMUSCULAR | Status: AC
  Filled 2023-11-12: qty 5

## 2023-11-12 MED ORDER — PHENYLEPHRINE HCL-NACL 20-0.9 MG/250ML-% IV SOLN
INTRAVENOUS | Status: DC | PRN
  Administered 2023-11-12: 80 ug/min via INTRAVENOUS

## 2023-11-12 MED ORDER — DEXAMETHASONE SODIUM PHOSPHATE 10 MG/ML IJ SOLN
INTRAMUSCULAR | Status: DC | PRN
  Administered 2023-11-12: 10 mg via INTRAVENOUS

## 2023-11-12 MED ORDER — ONDANSETRON HCL 4 MG/2ML IJ SOLN
INTRAMUSCULAR | Status: AC
  Filled 2023-11-12: qty 2

## 2023-11-12 MED ORDER — SUGAMMADEX SODIUM 200 MG/2ML IV SOLN
INTRAVENOUS | Status: DC | PRN
  Administered 2023-11-12: 240 mg via INTRAVENOUS

## 2023-11-12 MED ORDER — PROPOFOL 10 MG/ML IV BOLUS
INTRAVENOUS | Status: AC
  Filled 2023-11-12: qty 20

## 2023-11-12 MED ORDER — ONDANSETRON HCL 4 MG/2ML IJ SOLN
INTRAMUSCULAR | Status: DC | PRN
  Administered 2023-11-12: 4 mg via INTRAVENOUS

## 2023-11-12 MED ORDER — PHENYLEPHRINE 80 MCG/ML (10ML) SYRINGE FOR IV PUSH (FOR BLOOD PRESSURE SUPPORT)
PREFILLED_SYRINGE | INTRAVENOUS | Status: DC | PRN
  Administered 2023-11-12: 160 ug via INTRAVENOUS
  Administered 2023-11-12: 80 ug via INTRAVENOUS

## 2023-11-12 MED ORDER — ROCURONIUM BROMIDE 10 MG/ML (PF) SYRINGE
PREFILLED_SYRINGE | INTRAVENOUS | Status: AC
  Filled 2023-11-12: qty 10

## 2023-11-12 MED ORDER — SEVOFLURANE IN SOLN
RESPIRATORY_TRACT | Status: AC
  Filled 2023-11-12: qty 250

## 2023-11-12 MED ORDER — EPHEDRINE SULFATE-NACL 50-0.9 MG/10ML-% IV SOSY
PREFILLED_SYRINGE | INTRAVENOUS | Status: DC | PRN
  Administered 2023-11-12 (×2): 5 mg via INTRAVENOUS

## 2023-11-12 MED ORDER — PROPOFOL 10 MG/ML IV BOLUS
INTRAVENOUS | Status: DC | PRN
  Administered 2023-11-12: 80 mg via INTRAVENOUS

## 2023-11-12 MED ORDER — ROCURONIUM BROMIDE 100 MG/10ML IV SOLN
INTRAVENOUS | Status: DC | PRN
  Administered 2023-11-12: 60 mg via INTRAVENOUS
  Administered 2023-11-12: 20 mg via INTRAVENOUS

## 2023-11-12 MED ORDER — EPHEDRINE 5 MG/ML INJ
INTRAVENOUS | Status: AC
  Filled 2023-11-12: qty 5

## 2023-11-12 MED ORDER — LIDOCAINE HCL (CARDIAC) PF 100 MG/5ML IV SOSY
PREFILLED_SYRINGE | INTRAVENOUS | Status: DC | PRN
  Administered 2023-11-12: 100 mg via INTRAVENOUS

## 2023-11-12 MED ORDER — FENTANYL CITRATE (PF) 100 MCG/2ML IJ SOLN
INTRAMUSCULAR | Status: DC | PRN
  Administered 2023-11-12 (×2): 50 ug via INTRAVENOUS

## 2023-11-12 MED ORDER — SODIUM CHLORIDE 0.9 % IV SOLN: INTRAVENOUS | Status: DC | PRN

## 2023-11-12 NOTE — Progress Notes (Signed)
 Patient restless and confused throughout the evening. Continues to remove oxygen and tele leads all night long. Tele leads placed on patients back and oxygen applied throughout the evening. Patient remained NPO per order.

## 2023-11-12 NOTE — Anesthesia Postprocedure Evaluation (Signed)
 Anesthesia Post Note  Patient: Michele House  Procedure(s) Performed: VIDEO BRONCHOSCOPY WITH ENDOBRONCHIAL NAVIGATION (Right) FLEXIBLE BRONCHOSCOPY (Bilateral)  Patient location during evaluation: PACU Anesthesia Type: General Level of consciousness: awake and alert Pain management: pain level controlled Vital Signs Assessment: post-procedure vital signs reviewed and stable Respiratory status: spontaneous breathing, nonlabored ventilation, respiratory function stable and patient connected to nasal cannula oxygen Cardiovascular status: blood pressure returned to baseline and stable Postop Assessment: no apparent nausea or vomiting Anesthetic complications: no  No notable events documented.   Last Vitals:  Vitals:   11/12/23 1630 11/12/23 1645  BP:  (!) 101/57  Pulse:  89  Resp:  13  Temp: (!) 35.9 C   SpO2:  100%    Last Pain:  Vitals:   11/12/23 1347  TempSrc: Temporal  PainSc: 0-No pain                 Debby Mines

## 2023-11-12 NOTE — Transfer of Care (Signed)
 Immediate Anesthesia Transfer of Care Note  Patient: Michele House  Procedure(s) Performed: VIDEO BRONCHOSCOPY WITH ENDOBRONCHIAL NAVIGATION (Right) FLEXIBLE BRONCHOSCOPY (Bilateral)  Patient Location: PACU  Anesthesia Type:General  Level of Consciousness: drowsy  Airway & Oxygen Therapy: Patient Spontanous Breathing and Patient connected to face mask oxygen  Post-op Assessment: Report given to RN and Post -op Vital signs reviewed and stable  Post vital signs: Reviewed and stable  Last Vitals:  Vitals Value Taken Time  BP 122/69 1618  Temp 35.7 1618  Pulse 81 1618  Resp 18 1618  SpO2 99 1618    Last Pain:  Vitals:   11/12/23 1347  TempSrc: Temporal  PainSc: 0-No pain      Patients Stated Pain Goal: 1 (10/27/23 2038)  Complications: No notable events documented.

## 2023-11-12 NOTE — Progress Notes (Signed)
 Visited with Ms. Nuon at her bedside. Nursing staff at bedside preparing for procedure later today. No family at bedside. Will attempt visit tomorrow after procedure.   Waddell Lesches, AGNP-C Palliative Medicine   Please call Palliative Medicine team phone with any questions [phone]. For individual providers please see AMION.   No charge

## 2023-11-12 NOTE — Progress Notes (Signed)
 Occupational Therapy Treatment/Re-evaluation Patient Details Name: Michele House MRN: [id number] DOB: February 04, 1956 Today's Date: 11/12/2023   History of present illness Patient is a 68 year old with prolonged hospital stay, originally admitted with AMS. 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 She developed respiratory distress requiring mechanical ventilation. Self extubated 12/27 with re-intubation and extubation on 12/30. Pt also found with RLE DVT status post IVC filter placement given bleeding anticoagulation contraindicated. Seizure on 1/8 with transfer to 2nd floor.   OT comments  Pt is supine in bed on arrival. Alert and not necessarily agreeable to PT/OT co-tx and co-eval session. Goals were updated. She did not report pain. She is initially agitated and cussing at therapists, stating she wants to go home. However as the session went on, pt seemingly not willing to participate. She had slid down in the bed and noted to have soiled her linens. PT/OT assisted with all rolling and peri-care to get changed and repositioned in bed. Max A to roll to both sides, total assist for peri-care and total assist to get to [facility]. Nurse came in to change purewick and held pt's head up during repositioning since pt would not do it. When asked to do things she would simply state no! Or not answer at all. LUE/LLE she is able to move/use, however she remains with R sided hemi/no active movement or functional use. Pt left in bed with all needs in place and will cont to require skilled acute OT services to maximize his safety and IND to return to PLOF.       If plan is discharge home, recommend the following:  Assistance with cooking/housework;Assist for transportation;Help with stairs or ramp for entrance;Direct supervision/assist for medications management;Supervision due to cognitive status;Direct supervision/assist for financial management;Two people to help with walking  and/or transfers;Two people to help with bathing/dressing/bathroom   Equipment Recommendations  Other (comment) (defer)    Recommendations for Other Services      Precautions / Restrictions Precautions Precaution Comments: right side hemiparesis Restrictions Weight Bearing Restrictions Per Provider Order: No       Mobility Bed Mobility Overal bed mobility: Needs Assistance Bed Mobility: Rolling Rolling: Max assist         General bed mobility comments: Max A for rolling to bil sides for linen change, pt not participating/willing to assist    Transfers                         Balance                                           ADL either performed or assessed with clinical judgement   ADL Overall ADL's : Needs assistance/impaired                     Lower Body Dressing: Bed level;Maximal assistance;Total assistance Lower Body Dressing Details (indicate cue type and reason): total assist to doff mesh pantie and don clean ones     Toileting- Clothing Manipulation and Hygiene: +2 for physical assistance;Maximal assistance;Bed level Toileting - Clothing Manipulation Details (indicate cue type and reason): Max A to roll and Max A for peri-care and linen change            Extremity/Trunk Assessment Upper Extremity Assessment Upper Extremity Assessment: Left hand dominant;RUE deficits/detail RUE  Deficits / Details: hemi with grip weakness; AAROM of elbow flexion RUE Sensation: decreased light touch RUE Coordination: decreased fine motor;decreased gross motor            Vision       Perception     Praxis      Cognition Arousal: Alert Behavior During Therapy: Impulsive (a little irritated) Overall Cognitive Status: No family/caregiver present to determine baseline cognitive functioning                                 General Comments: Pt is oriented to self, agitated at beginning of session cussing, etc.  but then seemingly resued to participate/stopped interacting although she was awake and aware of PT/OT presence.        Exercises      Shoulder Instructions       General Comments vitals stable throughout    Pertinent Vitals/ Pain       Pain Assessment Pain Assessment: Faces Faces Pain Scale: No hurt Pain Intervention(s): Monitored during session  Home Living                                          Prior Functioning/Environment              Frequency  Min 1X/week        Progress Toward Goals  OT Goals(current goals can now be found in the care plan section)  Progress towards OT goals: Progressing toward goals  Acute Rehab OT Goals Patient Stated Goal: go home OT Goal Formulation: With patient Time For Goal Achievement: 11/16/23 Potential to Achieve Goals: Fair ADL Goals Pt Will Transfer to Toilet: with max assist;bedside commode;with +2 assist Pt Will Perform Toileting - Clothing Manipulation and hygiene: with max assist;sitting/lateral leans;with 2+ total assist  Plan      Co-evaluation    PT/OT/SLP Co-Evaluation/Treatment: Yes Reason for Co-Treatment: Complexity of the patient's impairments (multi-system involvement);Necessary to address cognition/behavior during functional activity;For patient/therapist safety;To address functional/ADL transfers PT goals addressed during session: Mobility/safety with mobility OT goals addressed during session: ADL's and self-care      AM-PAC OT 6 Clicks Daily Activity     Outcome Measure   Help from another person eating meals?: A Lot Help from another person taking care of personal grooming?: A Little Help from another person toileting, which includes using toliet, bedpan, or urinal?: Total Help from another person bathing (including washing, rinsing, drying)?: Total Help from another person to put on and taking off regular upper body clothing?: Total Help from another person to put on and  taking off regular lower body clothing?: Total 6 Click Score: 9    End of Session Equipment Utilized During Treatment: Oxygen  OT Visit Diagnosis: Other abnormalities of gait and mobility (R26.89);Muscle weakness (generalized) (M62.81);Hemiplegia and hemiparesis Hemiplegia - Right/Left: Right Hemiplegia - caused by: Cerebral infarction   Activity Tolerance Treatment limited secondary to agitation   Patient Left in bed;with call bell/phone within reach;with bed alarm set   Nurse Communication Mobility status        Time: 8849-8785 OT Time Calculation (min): 24 min  Charges: OT General Charges $OT Visit: 1 Visit OT Evaluation $OT Re-eval: 1 Re-eval  Jaylynne Birkhead, OTR/L  11/12/23, 1:27 PM   Mivaan Corbitt E Leyda Vanderwerf 11/12/2023, 1:23 PM

## 2023-11-12 NOTE — Progress Notes (Signed)
 Patient arrived to pacu from [facility]. Patient with external catheter, leaking, incont. Placed new external catheter, noted red rash like area peri area, will let accepting RN made aware.  Per report from anesthesia patient at baseline. Alert to her name.

## 2023-11-12 NOTE — Progress Notes (Signed)
 Progress Note   Patient: Michele House FMW:[id number] DOB: 1956/10/07 DOA: 10/02/2023     41 DOS: the patient was seen and examined on 11/12/2023   Brief hospital course:  Michele House is a 68 y.o. female with medical history significant of COPD, chronic respiratory failure on 2 L, hypertension, type 2 diabetes, HFpEF, recent discharge from the hospital 24th November 2024 after hospitalization for COPD exacerbation complicated by respiratory failure.  She presented to the hospital again on 10/02/2023 with chest pain, malaise, cough, shortness of breath, abdominal pain and dysuria.   She was admitted to the hospital for COPD exacerbation, acute on chronic hypoxemic respiratory failure, UTI and pneumonia.  She later developed worsening confusions, MRI brain revealed multiple acute strokes.   Prolonged hospital admission complicated by several issues including hemorrhagic shock requiring transfer to ICU for pressors and GI consultation. EGD showed duodenal ulcer/s that were treated with clipped but later required embolization with IR.  Pt also found with RLE DVT status post IVC filter placement given bleeding anticoagulation contraindicated.  For detailed hospital course and events, see [facility] progress note by Dr. Jens of 11/09/23.  Further hospital course and management as outlined below.   Assessment and Plan:  Acute metabolic encephalopathy: Improved. Initially completed 5 days of high-dose several IV Solu-Medrol  on 10/12/2023 Differential diagnosis include autoimmune encephalitis     Acute stroke with right hemiplegia: Off of Plavix  because of recent acute GI bleeding.   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   New Onset Seizure-like Activity - evening of 11/10/23 - see RN and cross  coverage notes --Neurology following - follow up on recommendations --Continue Keppra  500 mg BID --EEG with moderate diffuse encephalopathy --Seizure precautions --PRN Ativan  if seizure activity     Acute GI bleeding, bleeding duodenal ulcer:  S/p EGD on 10/28/2023 with clip placed.   S/p IR embolization on 10/30/2023.   --Continue Protonix . --Monitor CBC --Transfuse if Hbg < 7 or less than 8 with active bleeding or hemodynamic instability     Lower extremity pain, more severe in the left, right peroneal DVT: Patient complaining of pain in the lower extremities 1/7.  Initially refused ultrasound of the left lower extremity because of severe pain.  Ultrasound of the right lower extremity showed right calf DVT.  LLE U/S obtained 1/8 after pt agreed - no LLE DVT was seen. Due to high risk of recurrent GI bleeding given recent massive GI bleed requiring embolization by IR, unable to treat with anticoagulation. Vascular surgery placed IVC filter on 11/09/23     Hemorrhagic shock: Resolved Hypotension: Resolved.  Off of vasopressors    Acute blood loss anemia: H&H is stable.  S/p transfusion with 2 units of PRBCs on 10/28/2023, 1 unit of PRBCs on 10/29/2023.  S/p transfusion with 2 units of platelet pheresis on 10/29/2023. H&H is stable.     Pneumonia: Initially completed 5 days of IV ceftriaxone  and azithromycin  on 10/07/2023. She was restarted on broad-spectrum antibiotics (vancomycin , ceftriaxone  and Flagyl ) on 10/28/2023 when she decompensated requiring transfer to the ICU. Acute UTI: Urine culture showed strep agalactiae.  Completed antibiotics.     Acute on chronic diastolic CHF: Stable.  S/p treatment with IV Lasix .  Continue oral Lasix  Anxiety/panic attacks: Ativan  as needed.  Continue sertraline      COPD exacerbation: This had previously resolved.  However, she had recurrent COPD exacerbation and was restarted on steroids. Continue prednisone  taper through pulmonary 2025.      Acute on chronic hypoxemic respiratory failure, chronic hypercapnic respiratory failure: She was intubated on 10/28/2023 and extubated on 11/01/2023. She is tolerating 2 L/min oxygen via nasal cannula. She was strongly encouraged to use BiPAP every night to reduce risk of recurrent COPD exacerbation or worsening espiratory failure.     Type II DM with severe hyperglycemia, hypoglycemia blood sugar down to 48 on 11/09/2023: Decrease Semglee  from 10 units to 8 units daily.  Discontinue scheduled NovoLog  3 units with meals.  Use NovoLog  as needed for hyperglycemia.  Monitor glucose levels and adjust insulin  as needed. S/p treatment with IV insulin  drip. She may need less insulin  as prednisone  is being tapered off.     1.3 cm right upper lung spiculated nodule: S/p bronchoscopy on 10/20/2023.   Pathology was negative for malignancy and findings more consistent with reactive bronchial cells with acute inflammation. Pulmonologist is planning repeat lung biopsy -- today     General weakness: PT and OT recommended discharge to SNF.          Subjective: Pt seen with RN at bedside this AM.  Pt awake but with eyes closed laying sideways in bed, wearing mitten on one hand.  Per report, pt was quite agitated and restless overnight.  Today, pt states she isn't going to tell you when I asked her name for orientation exam.  Pt declines to speak any further, does not express any complaints.    Physical Exam: Vitals:   11/12/23 0756 11/12/23 0815 11/12/23 1144 11/12/23 1347  BP:  (!) 126/55 (!) 124/59 116/64  Pulse: 94 85  80  Resp: 14 15  16   Temp:  98.1 F (36.7 C) 97.8 F (36.6 C) (!) 97.2 F (36.2 C)  TempSrc:   Oral Temporal  SpO2: 92% (!) 87%  97%  Weight:    61.9 kg  Height:    5' (1.524 m)   General exam: awake, drowsy appearing, no acute distress, chronically ill appearaing HEENT: moist mucus membranes, hearing grossly normal  Respiratory system: lungs clear but severely  diminished throughtout, normal respiratory effort. Cardiovascular system: normal S1/S2, RRR, no peripheral edema Gastrointestinal system: soft, NT, ND, no HSM felt, +bowel sounds. Central nervous system: A&O x2+. no gross focal neurologic deficits, normal speech Skin: dry, intact, normal temperature, no rashes seen on visualized skin Psychiatry: normal mood, congruent affect   Data Reviewed:  Notable labs & studies --   Normal CBC except Hbg 9.2 CBG's today 177>>165 at goal    EEG -- no epileptic activity seen, showed moderate diffuse encephalopathy  U/S doppler LLE - negative for DVT  Pending --- repeat lung biopsy (1/10)     Family Communication: daughter updated by phone afternoon 1/9   Disposition: Status is: Inpatient  Remains inpatient appropriate because: ongoing evaluation including lung biopsy today. Needs SNF placement, pending.    Planned Discharge Destination: Skilled nursing facility    Time spent: 42 minutes  Author: Burnard DELENA Cunning, DO 11/12/2023 3:53 PM  For on call review [email or web address].

## 2023-11-12 NOTE — Progress Notes (Signed)
 NAME:  Michele House, MRN:  [id number], DOB:  1955-11-12, LOS: 41 ADMISSION DATE:  10/02/2023   History of Present Illness:  Case of a 68 year old female patient with a past medical history of COPD with chronic respiratory failure on 2 L nasal cannula, hypertension, type 2 diabetes, heart failure with preserved EF who has been here for a month.  Initially presented with altered mental status encephalopathy.  Had an extensive workup including brain MRI which was negative for any acute intracranial abnormality initially.  However unfortunately course complicated by acute CVA with repeat MRI showing multiple acute infarcts in the high bilateral posterior frontal and parietal lobes.  She recovered from that but course further complicated by waxing and waning mental status which prompted an LP to rule out encephalitis and has been unremarkable so far.  She was found to have right upper lobe lung nodule which was concerning for malignancy and underwent robotic assisted navigational bronchoscopy with biopsy on 12/27 which showed very rare atypical cells and reactive alveolar cells mostly.    Pertinent  Medical History  As abnove  Significant Hospital Events: Including procedures, antibiotic start and stop dates in addition to other pertinent events   12/01: Vital stable, respiratory viral panel negative, procalcitonin negative, preliminary blood cultures negative.  Urine cultures are ordered as add-on, ammonia levels mildly elevated at 39, strep pneumo negative, CBG elevated at 244, history of enlarging pulmonary nodule with recommendations for PET/CT during most recent admission which has not been done yet, if video bronchoscopy was scheduled for 10/13/2023 as outpatient. 12/02: Unable to take care of herself and son cannot provide the required care as she required 24-hour supervision due to worsening dementia.  PT is recommending SNF. 12/03: Blood pressure started trending up, restarting home  Zestoretic .  Urine cultures with strep agalactiae, penicillin allergy noted, will continue with ceftriaxone  for now.  Also started on Remeron  for concern of worsening depression and unable to sleep at night  12/05:  Patient has some blurry vision and weakness and pain.  MRI of the brain negative for acute intracranial abnormality. 12/08:  Patient more confused today than yesterday.  Able to follow some simple commands but not as good of a conversation today as yesterday. 12/09:  Patient still confused today.  Does not follow simple commands today. 12/10: Patient more talkative today but was looking up at the ceiling when she was talking with me.  She does not move her extremities to command but does move them on her own.  Physical therapy and Occupational Therapy did not see her move her right arm. 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  Mild associated petechial hemorrhage.  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 12/12: Patient with new stroke, CTA head and neck with no large vessel occlusion.  Patient had a recent echocardiogram which was normal. Concern of paraneoplastic versus hypercoagulability secondary to malignancy.  Patient missed her appointment for bronchoscopy and outpatient appointment for PET scan due to recurrent hospitalizations. 12/13: Patient continued to have waxing and waning mental status, unable to move right upper and lower extremities.  Having some hallucination.  Pulmonary is planning lung biopsy on 12/27 if she remains stable.  Rapidly progressive dementia and now with underlying stroke, question of paraneoplastic encephalitis and hypercoagulability secondary to underlying undiagnosed malignancy. 12/15: Patient with much  improved mentation.  Having some flickering of movements on right  upper and lower extremity today, left extremities weaker but seems improving.   12/18: Pt underwent bronchoscopy and lung biopsy 12/19: patient stable , she is being optimized for dc for rehab.  She's cleared from pulmonary for discharge. Results from bronch will take 1 week.   12/26: Pt transferred to the stepdown unit with acute hypercapnic respiratory requiring Bipap but subsequently required mechanical intubation due to worsening respiratory failure.  Insulin  gtt initiated due to severe hyperglycemia.  With hemorrhagic shock due to bleeding duodenal ulcer, underwent emergent EGD with successful hemostasis by injection of Epinephrine  and hemostatic clip. 12/27: Hgb slowly trending down, no report of bleeding from OG suction nor melena. Weaning down vasopressors, if able to continue to wean vasopressors, then can consider WUA/SBT.  Giving temporizing measures for Hyperkalemia. Self extubated but required reintubation. Hgb continued to trend down, given 1 unit pRBC's and 2 FFP.  IR consulted for consideration for embolization. 12/28: Weaned off Vasopressin , Levophed  weaning down.  S/p IR embolization of the Duodenal artery. Peak pressures and lung sounds improved with Ketamine , will keep intubated today given rapid reintubation yesterday. 12/30- patient on PRVC FiO2 28%, weaning off ketamine  and hoping to perform SBT today.  Holding feeds until post SBT.  11/02/23- patient is stable overnight no acute events, electrllytes improved.  No GI bleeds noted.  Opitimizing for TRH today.  11/03/23 patient is more interactive this am, still has not been able to get OOB.  Will continue to follow her from pulmonary with COPD and lung cancer. Her pathology was sent for second opinion due to mixed reporting from cytotech and pathology report.  11/03/22- patient is good spirits this am. Son came to visit yesterday and we reivewed medical plan. H/h is stable. Following peripherally from pulmonary perspective for COPD and  lung mass.  11/05/23- patient on 3L/min.  She still has 9 day old Right internal jugular central line.  I have placed orders for RN to remove this line today.  She required BIPAP yesterday but states she did not feel SOB>  11/06/23- patient shares she feels well today.  She is on 3L/min but takes it off at times. She reports breathing feels improved 11/07/23- patient with no acute events overnight.  H/h is improved 11/08/23- patient seen at bedside.  Sitting up in bed in no distress denies dyspnea.  She is on nasal canula 2L/min.  She asks if we can help her get glasses and dentures.  I received inconclusive results from lung biopsy. I discussed this with patient.  We discussed repeating the biopsy and patient states she wants this done as soon as possible. She is clinically stable.  Diagnosis Lung, biopsy, Right upper lobe - VERY RARE ATYPICAL CELLS, FAVOR REACTIVE. - FRAGMENTS OF ALVEOLATED LUNG AND BLOOD CLOT. Diagnosis Note Per [facility] recent imaging revealed an enlarging spiculated 1.6 cm right upper lung lesion. This case underwent intradepartmental consultation and Dr. Reed concurs with the interpretation. See concurrent case [id number]. Rexene  11/09/23- patient with acute dvt.  Not good candidate for Antcoagulation per GI due to upper GI bleed with duod ulcer 11/10/23- plant for lung biopsy on 11/12/23- reviewed with patient.  NPO Thursday night at midnight  11/11/23- I evaluated patient at bedside, she reports no acute events.  We discussed lung biopsy she is agreeable and wishes to proceed.  She asked if I can help her get glasses and new dentures and I did discuss this with  her son 08/11/24- patient evaluated prior to procedure , on room air saturating 92%. No questions regarding about procedure  Objective   Blood pressure (!) 124/59, pulse 85, temperature 97.8 F (36.6 C), temperature source Oral, resp. rate 15, height 5' (1.524 m), weight 61.9 kg, last menstrual period 12/24/1998, SpO2 (!)  87%.        Intake/Output Summary (Last 24 hours) at 11/12/2023 1344 Last data filed at 11/12/2023 0823 Gross per 24 hour  Intake 100 ml  Output --  Net 100 ml   Filed Weights   11/10/23 2340 11/11/23 0415 11/12/23 0351  Weight: 62.6 kg 62.9 kg 61.9 kg    Examination: General: Awake, follows verbal communication HENT: Supple neck reactive pupils  Lungs: mild wheezing b/l Cardiovascular: Normal S1, Normal S2, RRR  Abdomen: Soft, non tender, non distended, +BS  Extremities: Warm and well perfused no edema.   Labs and imaging were reviewed.   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  This has been sent for second opinion to be reviewed.  I think its most likely primary lung cancer.  Have discussed repeat lung biopsy and patient is agreeable to do this.    Best Practice (right click and Reselect all SmartList Selections daily)   Diet/type: NPO DVT prophylaxis SCD Pressure ulcer(s): N/A GI prophylaxis: PPI Lines: Central line Foley:  Yes, and it is still needed Code Status:  full code     Margarett Viti, M.D.  Pulmonary & Critical Care Medicine

## 2023-11-12 NOTE — Plan of Care (Signed)
  Problem: Education: Goal: Ability to describe self-care measures that may prevent or decrease complications (Diabetes Survival Skills Education) will improve Outcome: Progressing   Problem: Coping: Goal: Ability to adjust to condition or change in health will improve Outcome: Progressing   Problem: Fluid Volume: Goal: Ability to maintain a balanced intake and output will improve Outcome: Progressing   Problem: Health Behavior/Discharge Planning: Goal: Ability to identify and utilize available resources and services will improve Outcome: Progressing Goal: Ability to manage health-related needs will improve Outcome: Progressing   Problem: Metabolic: Goal: Ability to maintain appropriate glucose levels will improve Outcome: Progressing   Problem: Nutritional: Goal: Maintenance of adequate nutrition will improve Outcome: Progressing Goal: Progress toward achieving an optimal weight will improve Outcome: Progressing   Problem: Skin Integrity: Goal: Risk for impaired skin integrity will decrease Outcome: Progressing   Problem: Tissue Perfusion: Goal: Adequacy of tissue perfusion will improve Outcome: Progressing   Problem: Education: Goal: Knowledge of General Education information will improve Description: Including pain rating scale, medication(s)/side effects and non-pharmacologic comfort measures Outcome: Progressing   Problem: Health Behavior/Discharge Planning: Goal: Ability to manage health-related needs will improve Outcome: Progressing   Problem: Clinical Measurements: Goal: Ability to maintain clinical measurements within normal limits will improve Outcome: Progressing Goal: Will remain free from infection Outcome: Progressing Goal: Diagnostic test results will improve Outcome: Progressing Goal: Respiratory complications will improve Outcome: Progressing Goal: Cardiovascular complication will be avoided Outcome: Progressing   Problem: Activity: Goal:  Risk for activity intolerance will decrease Outcome: Progressing   Problem: Nutrition: Goal: Adequate nutrition will be maintained Outcome: Progressing   Problem: Coping: Goal: Level of anxiety will decrease Outcome: Progressing   Problem: Elimination: Goal: Will not experience complications related to bowel motility Outcome: Progressing Goal: Will not experience complications related to urinary retention Outcome: Progressing   Problem: Pain Management: Goal: General experience of comfort will improve Outcome: Progressing   Problem: Safety: Goal: Ability to remain free from injury will improve Outcome: Progressing   Problem: Skin Integrity: Goal: Risk for impaired skin integrity will decrease Outcome: Progressing   Problem: Education: Goal: Knowledge of disease or condition will improve Outcome: Progressing Goal: Knowledge of the prescribed therapeutic regimen will improve Outcome: Progressing Goal: Individualized Educational Video(s) Outcome: Progressing   Problem: Activity: Goal: Ability to tolerate increased activity will improve Outcome: Progressing Goal: Will verbalize the importance of balancing activity with adequate rest periods Outcome: Progressing   Problem: Respiratory: Goal: Ability to maintain a clear airway will improve Outcome: Progressing Goal: Levels of oxygenation will improve Outcome: Progressing Goal: Ability to maintain adequate ventilation will improve Outcome: Progressing   Problem: Activity: Goal: Ability to tolerate increased activity will improve Outcome: Progressing   Problem: Clinical Measurements: Goal: Ability to maintain a body temperature in the normal range will improve Outcome: Progressing   Problem: Respiratory: Goal: Ability to maintain adequate ventilation will improve Outcome: Progressing Goal: Ability to maintain a clear airway will improve Outcome: Progressing   Problem: Education: Goal: Knowledge of disease or  condition will improve Outcome: Progressing Goal: Knowledge of secondary prevention will improve (MUST DOCUMENT ALL) Outcome: Progressing Goal: Knowledge of patient specific risk factors will improve Loraine Leriche N/A or DELETE if not current risk factor) Outcome: Progressing   Problem: Ischemic Stroke/TIA Tissue Perfusion: Goal: Complications of ischemic stroke/TIA will be minimized Outcome: Progressing

## 2023-11-12 NOTE — Anesthesia Preprocedure Evaluation (Addendum)
 Anesthesia Evaluation  Patient identified by MRN, date of birth, ID band Patient confused    Reviewed: Allergy & Precautions, NPO status , Patient's Chart, lab work & pertinent test results  History of Anesthesia Complications (+) PROLONGED EMERGENCE and history of anesthetic complications  Airway Mallampati: III  TM Distance: <3 FB Neck ROM: full    Dental  (+) Missing   Pulmonary shortness of breath, pneumonia, unresolved, COPD,  COPD inhaler and oxygen dependent, former smoker   + rhonchi  + decreased breath sounds      Cardiovascular hypertension, Normal cardiovascular exam     Neuro/Psych Seizures -, Poorly Controlled,  CVA, Residual Symptoms  negative psych ROS   GI/Hepatic Neg liver ROS, PUD,GERD  Controlled,,  Endo/Other  diabetes, Type 2    Renal/GU      Musculoskeletal   Abdominal   Peds  Hematology negative hematology ROS (+)   Anesthesia Other Findings Past Medical History: No date: Acid reflux No date: COPD (chronic obstructive pulmonary disease) (HCC) No date: Diabetes mellitus without complication (HCC) No date: Hyperlipidemia No date: Hypertension No date: Thyroid  disease  Past Surgical History: No date: appendectomy No date: APPENDECTOMY No date: BREAST BIOPSY; Right     Comment:  CORE W/CLIP - NEG 10/28/2023: ESOPHAGOGASTRODUODENOSCOPY; N/A     Comment:  Procedure: ESOPHAGOGASTRODUODENOSCOPY (EGD);  Surgeon:               Michele Carmine, MD;  Location: [facility] ENDOSCOPY;  Service:               Endoscopy;  Laterality: N/A; 10/30/2023: IR EMBO ART  VEN HEMORR LYMPH EXTRAV  INC GUIDE  ROADMAPPING 11/09/2023: IVC FILTER INSERTION; N/A     Comment:  Procedure: IVC FILTER INSERTION;  Surgeon: Michele Selinda RAMAN,              MD;  Location: [facility] INVASIVE CV LAB;  Service:               Cardiovascular;  Laterality: N/A; No date: TOTAL VAGINAL HYSTERECTOMY No date: TUMOR REMOVAL     Comment:   benign;stomach 10/20/2023: VIDEO BRONCHOSCOPY WITH ENDOBRONCHIAL NAVIGATION; Right     Comment:  Procedure: VIDEO BRONCHOSCOPY WITH ENDOBRONCHIAL               NAVIGATION;  Surgeon: Michele Manna, MD;  Location:               [facility] ORS;  Service: Thoracic;  Laterality: Right;  BMI    Body Mass Index: 26.65 kg/m      Reproductive/Obstetrics negative OB ROS                             Anesthesia Physical Anesthesia Plan  ASA: 3  Anesthesia Plan: General ETT   Post-op Pain Management:    Induction: Intravenous  PONV Risk Score and Plan: Ondansetron , Dexamethasone , Midazolam  and Treatment may vary due to age or medical condition  Airway Management Planned: Oral ETT  Additional Equipment:   Intra-op Plan:   Post-operative Plan: Possible Post-op intubation/ventilation  Informed Consent: I have reviewed the patients History and Physical, chart, labs and discussed the procedure including the risks, benefits and alternatives for the proposed anesthesia with the patient or authorized representative who has indicated his/her understanding and acceptance.   Patient has DNR.  Discussed DNR with power of attorney and Suspend DNR.   Dental Advisory Given  Plan Discussed with: Anesthesiologist, CRNA and Surgeon  Anesthesia Plan Comments: (I spoke with Dr. Merrianne with neurology, who is following the patient for her confusion, stroke and seizure history.  He feels that it is safe to expose the patient to a general anesthetic.  History and phone consent from the patients daughter Michele House at [phone]  Partial DNR. No chest compression  Daughter consented for risks of anesthesia including but not limited to:  - adverse reactions to medications - damage to eyes, teeth, lips or other oral mucosa - nerve damage due to positioning  - sore throat or hoarseness - Damage to heart, brain, nerves, lungs, other parts of body or loss of life  She voiced  understanding and assent.)       Anesthesia Quick Evaluation

## 2023-11-12 NOTE — Progress Notes (Signed)
 Physical Therapy Treatment Patient Details Name: Michele House MRN: [id number] DOB: October 31, 1956 Today's Date: 11/12/2023   History of Present Illness Patient is a 68 year old with prolonged hospital stay, originally admitted with AMS. 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 She developed respiratory distress requiring mechanical ventilation. Self extubated 12/27 with re-intubation and extubation on 12/30. Pt also found with RLE DVT status post IVC filter placement given bleeding anticoagulation contraindicated. Seizure on 1/8 with transfer to 2nd floor.    PT Comments  PT/OT co-treatment performed.  Pt resting in bed upon therapy arrival; pt appearing agitated and cussing at therapists (pt reporting wanting to go home).  Pt then became more quiet (occasionally talking) and did not appear to want to participate in sessions activities (even though pt noted to have wet linens requiring changing and pt needing to get slid up in bed to improve positioning).  Pt max assist with logrolling L/R in bed with 2nd assist for changing linens and peri-care; nurse present end of session to finish assisting with pt care and repositioning in bed.  No active R UE/LE movement noted (pt moving L UE/LE on own but not to cueing).  PT POC reviewed and updated.    If plan is discharge home, recommend the following: Two people to help with walking and/or transfers;Two people to help with bathing/dressing/bathroom;Assistance with cooking/housework;Direct supervision/assist for medications management;Assistance with feeding;Direct supervision/assist for financial management;Assist for transportation;Help with stairs or ramp for entrance;Supervision due to cognitive status   Can travel by private vehicle     No  Equipment Recommendations  Other (comment) (TBD at next venue of care)    Recommendations for Other Services       Precautions / Restrictions Precautions Precautions:  Fall Precaution Comments: right side hemiparesis Restrictions Weight Bearing Restrictions Per Provider Order: No     Mobility  Bed Mobility Overal bed mobility: Needs Assistance Bed Mobility: Rolling Rolling: Max assist, +2 for safety/equipment         General bed mobility comments: Max assist for logrolling R/L in bed for bed linen change (pt not appearing participatory/willing to assist much); vc's for technique    Transfers                   General transfer comment: Deferred per pt's level of participation    Ambulation/Gait                   Stairs             Wheelchair Mobility     Tilt Bed    Modified Rankin (Stroke Patients Only)       Balance                                            Cognition Arousal: Alert Behavior During Therapy: Impulsive (Pt appearing a little irritated) Overall Cognitive Status: No family/caregiver present to determine baseline cognitive functioning                                 General Comments: Pt oriented to self; pt appearing agitated at beginning of session (pt cussing/etc) but then pt appearing to refuse to participate/stopped interacting with therapists (pt occasionally talking; pt awake and appearing aware of PT/OT presence)  Exercises      General Comments General comments (skin integrity, edema, etc.): HR and SpO2 sats apppearing stable throughout session      Pertinent Vitals/Pain Pain Assessment Pain Assessment: Faces Faces Pain Scale: No hurt Pain Intervention(s): Limited activity within patient's tolerance, Monitored during session, Repositioned    Home Living                          Prior Function            PT Goals (current goals can now be found in the care plan section) Acute Rehab PT Goals Patient Stated Goal: pt did not state goal PT Goal Formulation: With patient Time For Goal Achievement: 11/16/23 Potential to  Achieve Goals: Fair Progress towards PT goals: Not progressing toward goals - comment (Limited per pt's level of participation)    Frequency    Min 1X/week      PT Plan      Co-evaluation PT/OT/SLP Co-Evaluation/Treatment: Yes Reason for Co-Treatment: Complexity of the patient's impairments (multi-system involvement);Necessary to address cognition/behavior during functional activity;For patient/therapist safety;To address functional/ADL transfers PT goals addressed during session: Mobility/safety with mobility OT goals addressed during session: ADL's and self-care      AM-PAC PT 6 Clicks Mobility   Outcome Measure  Help needed turning from your back to your side while in a flat bed without using bedrails?: A Lot Help needed moving from lying on your back to sitting on the side of a flat bed without using bedrails?: Total Help needed moving to and from a bed to a chair (including a wheelchair)?: Total Help needed standing up from a chair using your arms (e.g., wheelchair or bedside chair)?: Total Help needed to walk in hospital room?: Total Help needed climbing 3-5 steps with a railing? : Total 6 Click Score: 7    End of Session Equipment Utilized During Treatment: Oxygen Activity Tolerance: Patient limited by fatigue Patient left: in bed;with call bell/phone within reach;with bed alarm set Nurse Communication: Mobility status;Precautions;Other (comment) (Nurse present end of session placing new Purewick and assisting with pt care needs) PT Visit Diagnosis: Unsteadiness on feet (R26.81);Other abnormalities of gait and mobility (R26.89);Muscle weakness (generalized) (M62.81);Difficulty in walking, not elsewhere classified (R26.2);Hemiplegia and hemiparesis Hemiplegia - Right/Left: Right Hemiplegia - dominant/non-dominant: Dominant Hemiplegia - caused by: Cerebral infarction     Time: 8849-8785 PT Time Calculation (min) (ACUTE ONLY): 24 min  Charges:    $Therapeutic  Activity: 8-22 mins PT General Charges $$ ACUTE PT VISIT: 1 Visit                     Damien Caulk, PT 11/12/23, 4:49 PM

## 2023-11-12 NOTE — Plan of Care (Signed)
  Problem: Metabolic: Goal: Ability to maintain appropriate glucose levels will improve Outcome: Progressing   Problem: Skin Integrity: Goal: Risk for impaired skin integrity will decrease Outcome: Progressing   Problem: Tissue Perfusion: Goal: Adequacy of tissue perfusion will improve Outcome: Progressing

## 2023-11-12 NOTE — Procedures (Signed)
 ELECTROMAGNETIC NAVIGATIONAL BRONCHOSCOPY PROCEDURE NOTE  FIBEROPTIC BRONCHOSCOPY WITH BRONCHOALVEOLAR LAVAGE PROCEDURE NOTE  ENDOBRONCHIAL ULTRASOUND PROCEDURE NOTE  FLEXIBLE BRONCHOSCOPY WITH THERAPEUTIC ASPIRATION OF TRACHEBRONCHIAL TREE    Flexible bronchoscopy was performed  by : Parris MD  assistance by : 1)Repiratory therapist  and 2)[employer] staff and 3) Anesthesia team and 4) Flouroscopy team and 5) [facility]   Indication for the procedure was :  Pre-procedural H&P. The following assessment was performed on the day of the procedure prior to initiating sedation History:  Chest pain n Dyspnea y Hemoptysis n Cough y Fever n Other pertinent items n  Examination Vital signs -reviewed as per nursing documentation today Cardiac    Murmurs: n  Rubs : n  Gallop: n Lungs Wheezing: n Rales : n Rhonchi :y  Other pertinent findings: SOB/hypoxemia due to chronic lung disease   Pre-procedural assessment for Procedural Sedation included: Depth of sedation: As per anesthesia team  ASA Classification:  2 Mallampati airway assessment: 3    Medication list reviewed: y  The patient's interval history was taken and revealed: no new complaints The pre- procedure physical examination revealed: No new findings Refer to prior clinic note for details.  Informed Consent: Informed consent was obtained from:  patient after explanation of procedure and risks, benefits, as well as alternative procedures available.  Explanation of level of sedation and possible transfusion was also provided.    Procedural Preparation: Time out was performed and patient was identified by name and birthdate and procedure to be performed and side for sampling, if any, was specified. Pt was intubated by anesthesia.  The patient was appropriately draped.   Fiberoptic bronchoscopy with airway inspection and BAL Procedure findings:  Bronchoscope was inserted via ETT  without difficulty.  Posterior  oropharynx, epiglottis, arytenoids, false cords and vocal cords were not visualized as these were bypassed by endotracheal tube. The distal trachea was normal in circumference and appearance without mucosal, cartilaginous or branching abnormalities.  The main carina was mildly splayed . All right and left lobar airways were visualized to the Subsegmental level.  Sub- sub segmental carinae were identified in all the distal airways.   Secretions were visible in the following airways and appeared to be clear.  The mucosa was : friable at RUL  Airways were notable for:        exophytic lesions :n       extrinsic compression in the following distributions: n.       Friable mucosa: y       Teacher, music /pigmentation: n     Post procedure Diagnosis:   MUCUS PLUGGING OF RIGHT UPPER LOBE REQUIRING THERAPEUTIC ASPIRATION OF TRACHEOBRONCHIAL TREE  EDEMATOUS SUBSEGMENTAL AIRWAYS OF RIGHT UPPER LOBE   BRONCHOALVEOLAR LAVAGE PERFORMED AT RIGHT UPPER LOBE WITH CELLUAR AND SEROSANG RETURN   Electromagnetic Navigational Bronchoscopy Procedure Findings:    Post appropriate planning and registration peripheral navigation was used to visualize target lesion.    Post procedure diagnosis: LUNG CANCER  SURGICAL PATHOLOGY - ENDOBRONCHIAL BIOPSY X 9    Endobronchial ultrasound assisted hilar and mediastinal lymph node biopsies procedure findings: The fiberoptic bronchoscope was removed and the EBUS scope was introduced. Examination began to evaluate for pathologically enlarged lymph nodes starting on the LEFT side progressing to the RIGHT side.  All lymph node biopsies performed with 21 needle. Lymph node biopsies were sent in cytolite for all stations.  STATION 4L - NOT BIOPSIED STATION 10L - NOT BIOPSIED STATION 7 - NOT BIOPSIED  STATION 10R - NOT BIOPSIED STATION 4R - NOT BIOPSIED   Post procedure diagnosis:  NORMAL LYMPH NODE STATIONS    Broncho-alveolar lavage  site:RIGHT UPPER LOBE   sent for CYTOLOGY                             40ml volume infused 20ml volume returned with [facility] appearance  Endobronchial biopsy site:  RUL; sent for CYTOLOGY                                   Fluoroscopy Used: YES ;        Pictorial documentation attached: NONE                  Transbronchial WANG needle aspiration site: NONE  sent for: N/A                                     Immediate sampling complications included:NONE  Epinephrine  NONE ml was used topically  The bronchoscopy was terminated due to completion of the planned procedure and the bronchoscope was removed.   Total dosage of Lidocaine  was 3mg  Total fluoroscopy time was AS PER RADIOLOGY minutes  Supplemental oxygen was provided at AS PER ANESTHESIA lpm by nasal canula post operatively  Estimated Blood loss: <EXPECTED 5cc.  Complications included:  NONE IMMEDIATE   Preliminary CXR findings :  IN PROCESS   Disposition: STILL HOSPITALIZED   Follow up with Dr. Estell Dillinger in 5 days for result discussion.     Halina Picking MD  [facility] Division of Pulmonary & Critical Care Medicine

## 2023-11-12 NOTE — Plan of Care (Signed)
  Problem: Coping: Goal: Ability to adjust to condition or change in health will improve Outcome: Not Progressing   Problem: Fluid Volume: Goal: Ability to maintain a balanced intake and output will improve Outcome: Not Progressing   Problem: Health Behavior/Discharge Planning: Goal: Ability to manage health-related needs will improve Outcome: Not Progressing

## 2023-11-12 NOTE — Anesthesia Procedure Notes (Signed)
 Procedure Name: Intubation Date/Time: 11/12/2023 2:28 PM  Performed by: Jackye Spanner, CRNAPre-anesthesia Checklist: Patient identified, Patient being monitored, Timeout performed, Emergency Drugs available and Suction available Patient Re-evaluated:Patient Re-evaluated prior to induction Oxygen Delivery Method: Circle system utilized Preoxygenation: Pre-oxygenation with 100% oxygen Induction Type: IV induction Ventilation: Mask ventilation without difficulty Laryngoscope Size: 3 and McGrath Grade View: Grade I Tube type: Oral Tube size: 8.5 mm Number of attempts: 1 Airway Equipment and Method: Stylet Placement Confirmation: ETT inserted through vocal cords under direct vision, positive ETCO2 and breath sounds checked- equal and bilateral Secured at: 20 cm Tube secured with: Tape Dental Injury: Teeth and Oropharynx as per pre-operative assessment  Comments: Smooth atraumatic intubation, no complications noted.

## 2023-11-13 ENCOUNTER — Encounter: Payer: Self-pay | Admitting: Pulmonary Disease

## 2023-11-13 DIAGNOSIS — Z515 Encounter for palliative care: Secondary | ICD-10-CM | POA: Diagnosis not present

## 2023-11-13 DIAGNOSIS — M79604 Pain in right leg: Secondary | ICD-10-CM | POA: Diagnosis not present

## 2023-11-13 DIAGNOSIS — J441 Chronic obstructive pulmonary disease with (acute) exacerbation: Secondary | ICD-10-CM | POA: Diagnosis not present

## 2023-11-13 DIAGNOSIS — G9341 Metabolic encephalopathy: Secondary | ICD-10-CM | POA: Diagnosis not present

## 2023-11-13 LAB — GLUCOSE, CAPILLARY
Glucose-Capillary: 101 mg/dL — ABNORMAL HIGH (ref 70–99)
Glucose-Capillary: 268 mg/dL — ABNORMAL HIGH (ref 70–99)
Glucose-Capillary: 388 mg/dL — ABNORMAL HIGH (ref 70–99)
Glucose-Capillary: 244 mg/dL — ABNORMAL HIGH (ref 70–99)

## 2023-11-13 LAB — CBC
HCT: 29.3 % — ABNORMAL LOW (ref 36.0–46.0)
Hemoglobin: 9.3 g/dL — ABNORMAL LOW (ref 12.0–15.0)
MCH: 29.5 pg (ref 26.0–34.0)
MCHC: 31.7 g/dL (ref 30.0–36.0)
MCV: 93 fL (ref 80.0–100.0)
Platelets: 272 10*3/uL (ref 150–400)
RBC: 3.15 MIL/uL — ABNORMAL LOW (ref 3.87–5.11)
RDW: 16.3 % — ABNORMAL HIGH (ref 11.5–15.5)
WBC: 5.2 10*3/uL (ref 4.0–10.5)
nRBC: 0 % (ref 0.0–0.2)

## 2023-11-13 LAB — T4, FREE: Free T4: 0.38 ng/dL — ABNORMAL LOW (ref 0.61–1.12)

## 2023-11-13 LAB — TSH: TSH: 49.663 u[IU]/mL — ABNORMAL HIGH (ref 0.350–4.500)

## 2023-11-13 NOTE — Plan of Care (Signed)
  Problem: Education: Goal: Ability to describe self-care measures that may prevent or decrease complications (Diabetes Survival Skills Education) will improve Outcome: Progressing   Problem: Nutritional: Goal: Maintenance of adequate nutrition will improve Outcome: Progressing   Problem: Clinical Measurements: Goal: Will remain free from infection Outcome: Progressing Goal: Respiratory complications will improve Outcome: Progressing

## 2023-11-13 NOTE — Progress Notes (Signed)
 Progress Note   Patient: Michele House DOB: 1955-11-05 DOA: 10/02/2023     42 DOS: the patient was seen and examined on 11/13/2023   Brief hospital course:  Michele House is a 68 y.o. female with medical history significant of COPD, chronic respiratory failure on 2 L, hypertension, type 2 diabetes, HFpEF, recent discharge from the hospital 24th November 2024 after hospitalization for COPD exacerbation complicated by respiratory failure.  She presented to the hospital again on 10/02/2023 with chest pain, malaise, cough, shortness of breath, abdominal pain and dysuria.   She was admitted to the hospital for COPD exacerbation, acute on chronic hypoxemic respiratory failure, UTI and pneumonia.  She later developed worsening confusions, MRI brain revealed multiple acute strokes.   Prolonged hospital admission complicated by several issues including hemorrhagic shock requiring transfer to ICU for pressors and GI consultation. EGD showed duodenal ulcer/s that were treated with clipped but later required embolization with IR.  Pt also found with RLE DVT status post IVC filter placement given bleeding anticoagulation contraindicated.  For detailed hospital course and events, see [facility] progress note by Dr. Jens of 11/09/23.  Further hospital course and management as outlined below.   Assessment and Plan:  Acute metabolic encephalopathy: Improved. Initially completed 5 days of high-dose several IV Solu-Medrol  on 10/12/2023 Differential diagnosis include autoimmune encephalitis     Acute stroke with right hemiplegia: Off of Plavix  because of recent acute GI bleeding.   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   New Onset Seizure-like Activity - evening of 11/10/23 - see RN and cross  coverage notes --Neurology following - follow up on recommendations --Continue Keppra  500 mg BID --EEG with moderate diffuse encephalopathy --Seizure precautions --PRN Ativan  if seizure activity     Acute GI bleeding, bleeding duodenal ulcer:  S/p EGD on 10/28/2023 with clip placed.   S/p IR embolization on 10/30/2023.   --Continue Protonix . --Monitor CBC --Transfuse if Hbg < 7 or less than 8 with active bleeding or hemodynamic instability     Lower extremity pain, more severe in the left, right peroneal DVT: Patient complaining of pain in the lower extremities 1/7.  Initially refused ultrasound of the left lower extremity because of severe pain.  Ultrasound of the right lower extremity showed right calf DVT.  LLE U/S obtained 1/8 after pt agreed - no LLE DVT was seen. Due to high risk of recurrent GI bleeding given recent massive GI bleed requiring embolization by IR, unable to treat with anticoagulation. Vascular surgery placed IVC filter on 11/09/23     Hemorrhagic shock: Resolved Hypotension: Resolved.  Off of vasopressors    Acute blood loss anemia: H&H is stable.  S/p transfusion with 2 units of PRBCs on 10/28/2023, 1 unit of PRBCs on 10/29/2023.  S/p transfusion with 2 units of platelet pheresis on 10/29/2023. H&H is stable.     Pneumonia: Initially completed 5 days of IV ceftriaxone  and azithromycin  on 10/07/2023. She was restarted on broad-spectrum antibiotics (vancomycin , ceftriaxone  and Flagyl ) on 10/28/2023 when she decompensated requiring transfer to the ICU. Acute UTI: Urine culture showed strep agalactiae.  Completed antibiotics.     Acute on chronic diastolic CHF: Stable.  S/p treatment with IV Lasix .  Continue oral Lasix  Anxiety/panic attacks: Ativan  as needed.  Continue sertraline      COPD exacerbation: This had previously resolved.  However, she had recurrent COPD exacerbation and was restarted on steroids. Continue prednisone  taper through pulmonary 2025.      Acute on chronic hypoxemic respiratory failure, chronic hypercapnic respiratory failure: She was intubated on 10/28/2023 and extubated on 11/01/2023. She is tolerating 2 L/min oxygen via nasal cannula. She was strongly encouraged to use BiPAP every night to reduce risk of recurrent COPD exacerbation or worsening espiratory failure.     Type II DM with severe hyperglycemia, hypoglycemia blood sugar down to 48 on 11/09/2023: Decrease Semglee  from 10 units to 8 units daily.  Discontinue scheduled NovoLog  3 units with meals.  Use NovoLog  as needed for hyperglycemia.  Monitor glucose levels and adjust insulin  as needed. S/p treatment with IV insulin  drip. She may need less insulin  as prednisone  is being tapered off.     1.3 cm right upper lung spiculated nodule: S/p bronchoscopy on 10/20/2023.   Pathology was negative for malignancy and findings more consistent with reactive bronchial cells with acute inflammation. Pulmonologist is planning repeat lung biopsy -- today     General weakness: PT and OT recommended discharge to SNF.          Subjective: Pt seen with RN at bedside this AM.  Pt awake but with eyes closed laying sideways in bed, wearing mitten on one hand.  Per report, pt was quite agitated and restless overnight.  Today, pt states she isn't going to tell you when I asked her name for orientation exam.  Pt declines to speak any further, does not express any complaints.    Physical Exam: Vitals:   11/13/23 1130 11/13/23 1135 11/13/23 1140 11/13/23 1142  BP: (!) 87/57   (!) 106/55  Pulse: 87 86 87   Resp: 17 15 18    Temp:      TempSrc:      SpO2: 99% 99% 98%   Weight:      Height:       General exam: awake, drowsy appearing, no acute distress, chronically ill appearaing HEENT: moist mucus membranes, hearing grossly normal  Respiratory system: lungs clear but severely diminished throughtout, normal respiratory effort. Cardiovascular system: normal S1/S2, RRR, no  peripheral edema Gastrointestinal system: soft, NT, ND, no HSM felt, +bowel sounds. Central nervous system: A&O x2+. no gross focal neurologic deficits, normal speech Skin: dry, intact, normal temperature, no rashes seen on visualized skin Psychiatry: normal mood, congruent affect   Data Reviewed:  Notable labs & studies --   Normal CBC except Hbg 9.2 CBG's today 177>>165 at goal    EEG -- no epileptic activity seen, showed moderate diffuse encephalopathy  U/S doppler LLE - negative for DVT  Pending --- repeat lung biopsy (1/10)     Family Communication: daughter updated by phone afternoon 1/9, 1/11.    Disposition: Status is: Inpatient  Remains inpatient appropriate because: Needs SNF placement, pending.    Planned Discharge Destination: Skilled nursing facility    Time spent: 42 minutes  Author: Burnard DELENA Cunning, DO 11/13/2023 2:52 PM  For on call review [email or web address].

## 2023-11-13 NOTE — Progress Notes (Signed)
 NAME:  MIGDALIA OLEJNICZAK, MRN:  [id number], DOB:  [birth year], LOS: 42 ADMISSION DATE:  10/02/2023   History of Present Illness:  Case of a 68 year old female patient with a past medical history of COPD with chronic respiratory failure on 2 L nasal cannula, hypertension, type 2 diabetes, heart failure with preserved EF who has been here for a month.  Initially presented with altered mental status encephalopathy.  Had an extensive workup including brain MRI which was negative for any acute intracranial abnormality initially.  However unfortunately course complicated by acute CVA with repeat MRI showing multiple acute infarcts in the high bilateral posterior frontal and parietal lobes.  She recovered from that but course further complicated by waxing and waning mental status which prompted an LP to rule out encephalitis and has been unremarkable so far.  She was found to have right upper lobe lung nodule which was concerning for malignancy and underwent robotic assisted navigational bronchoscopy with biopsy on 12/27 which showed very rare atypical cells and reactive alveolar cells mostly.    Pertinent  Medical History  As abnove  Significant Hospital Events: Including procedures, antibiotic start and stop dates in addition to other pertinent events   12/01: Vital stable, respiratory viral panel negative, procalcitonin negative, preliminary blood cultures negative.  Urine cultures are ordered as add-on, ammonia levels mildly elevated at 39, strep pneumo negative, CBG elevated at 244, history of enlarging pulmonary nodule with recommendations for PET/CT during most recent admission which has not been done yet, if video bronchoscopy was scheduled for 10/13/2023 as outpatient. 12/02: Unable to take care of herself and son cannot provide the required care as she required 24-hour supervision due to worsening dementia.  PT is recommending SNF. 12/03: Blood pressure started trending up, restarting home  Zestoretic .  Urine cultures with strep agalactiae, penicillin allergy noted, will continue with ceftriaxone  for now.  Also started on Remeron  for concern of worsening depression and unable to sleep at night  12/05:  Patient has some blurry vision and weakness and pain.  MRI of the brain negative for acute intracranial abnormality. 12/08:  Patient more confused today than yesterday.  Able to follow some simple commands but not as good of a conversation today as yesterday. 12/09:  Patient still confused today.  Does not follow simple commands today. 12/10: Patient more talkative today but was looking up at the ceiling when she was talking with me.  She does not move her extremities to command but does move them on her own.  Physical therapy and Occupational Therapy did not see her move her right arm. 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  Mild associated petechial hemorrhage.  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 12/12: Patient with new stroke, CTA head and neck with no large vessel occlusion.  Patient had a recent echocardiogram which was normal. Concern of paraneoplastic versus hypercoagulability secondary to malignancy.  Patient missed her appointment for bronchoscopy and outpatient appointment for PET scan due to recurrent hospitalizations. 12/13: Patient continued to have waxing and waning mental status, unable to move right upper and lower extremities.  Having some hallucination.  Pulmonary is planning lung biopsy on 12/27 if she remains stable.  Rapidly progressive dementia and now with underlying stroke, question of paraneoplastic encephalitis and hypercoagulability secondary to underlying undiagnosed malignancy. 12/15: Patient with much  improved mentation.  Having some flickering of movements on right  upper and lower extremity today, left extremities weaker but seems improving.   12/18: Pt underwent bronchoscopy and lung biopsy 12/19: patient stable , she is being optimized for dc for rehab.  She's cleared from pulmonary for discharge. Results from bronch will take 1 week.   12/26: Pt transferred to the stepdown unit with acute hypercapnic respiratory requiring Bipap but subsequently required mechanical intubation due to worsening respiratory failure.  Insulin  gtt initiated due to severe hyperglycemia.  With hemorrhagic shock due to bleeding duodenal ulcer, underwent emergent EGD with successful hemostasis by injection of Epinephrine  and hemostatic clip. 12/27: Hgb slowly trending down, no report of bleeding from OG suction nor melena. Weaning down vasopressors, if able to continue to wean vasopressors, then can consider WUA/SBT.  Giving temporizing measures for Hyperkalemia. Self extubated but required reintubation. Hgb continued to trend down, given 1 unit pRBC's and 2 FFP.  IR consulted for consideration for embolization. 12/28: Weaned off Vasopressin , Levophed  weaning down.  S/p IR embolization of the Duodenal artery. Peak pressures and lung sounds improved with Ketamine , will keep intubated today given rapid reintubation yesterday. 12/30- patient on PRVC FiO2 28%, weaning off ketamine  and hoping to perform SBT today.  Holding feeds until post SBT.  11/02/23- patient is stable overnight no acute events, electrllytes improved.  No GI bleeds noted.  Opitimizing for TRH today.  11/03/23 patient is more interactive this am, still has not been able to get OOB.  Will continue to follow her from pulmonary with COPD and lung cancer. Her pathology was sent for second opinion due to mixed reporting from cytotech and pathology report.  11/03/22- patient is good spirits this am. Son came to visit yesterday and we reivewed medical plan. H/h is stable. Following peripherally from pulmonary perspective for COPD and  lung mass.  11/05/23- patient on 3L/min.  She still has 9 day old Right internal jugular central line.  I have placed orders for RN to remove this line today.  She required BIPAP yesterday but states she did not feel SOB>  11/06/23- patient shares she feels well today.  She is on 3L/min but takes it off at times. She reports breathing feels improved 11/07/23- patient with no acute events overnight.  H/h is improved 11/08/23- patient seen at bedside.  Sitting up in bed in no distress denies dyspnea.  She is on nasal canula 2L/min.  She asks if we can help her get glasses and dentures.  I received inconclusive results from lung biopsy. I discussed this with patient.  We discussed repeating the biopsy and patient states she wants this done as soon as possible. She is clinically stable.  Diagnosis Lung, biopsy, Right upper lobe - VERY RARE ATYPICAL CELLS, FAVOR REACTIVE. - FRAGMENTS OF ALVEOLATED LUNG AND BLOOD CLOT. Diagnosis Note Per [facility] recent imaging revealed an enlarging spiculated 1.6 cm right upper lung lesion. This case underwent intradepartmental consultation and Dr. Reed concurs with the interpretation. See concurrent case [id number]. Rexene  11/09/23- patient with acute dvt.  Not good candidate for Antcoagulation per GI due to upper GI bleed with duod ulcer 11/10/23- plant for lung biopsy on 11/12/23- reviewed with patient.  NPO Thursday night at midnight  11/11/23- I evaluated patient at bedside, she reports no acute events.  We discussed lung biopsy she is agreeable and wishes to proceed.  She asked if I can help her get glasses and new dentures and I did discuss this with  her son 08/11/24- patient evaluated prior to procedure , on room air saturating 92%. No questions regarding about procedure 11/13/23-patient is on 2L/min [place].  She appears to be in good spirits asking to be discharged.  She does report partial weakness on left side post CVA but on examination from me does not seem to have FND.   She has PT/OT ordered.  She can likely be weaned from oxygen prior to dc home. Her pathology results from lung biopsy are in process.   Objective   Blood pressure 102/70, pulse 86, temperature 97.9 F (36.6 C), temperature source Axillary, resp. rate 15, height 5' (1.524 m), weight 68.3 kg, last menstrual period 12/24/1998, SpO2 97%.        Intake/Output Summary (Last 24 hours) at 11/13/2023 1801 Last data filed at 11/13/2023 1703 Gross per 24 hour  Intake 100 ml  Output 900 ml  Net -800 ml   Filed Weights   11/12/23 0351 11/12/23 1347 11/13/23 0850  Weight: 61.9 kg 61.9 kg 68.3 kg    Examination: General: Awake, follows verbal communication HENT: Supple neck reactive pupils  Lungs: mild wheezing b/l Cardiovascular: Normal S1, Normal S2, RRR  Abdomen: Soft, non tender, non distended, +BS  Extremities: Warm and well perfused no edema.   Labs and imaging were reviewed.   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  This has been sent for second opinion to be reviewed.  I think its most likely primary lung cancer.  S/p lung biopsy, on 2L/min [place] saturating 100% can be weaned off as per RT   Best Practice (right click and Reselect all SmartList Selections daily)   Diet/type: NPO DVT prophylaxis SCD Pressure ulcer(s): N/A GI prophylaxis: PPI Lines: Central line Foley:  Yes, and it is still needed Code Status:  full code     Yeslin Delio, M.D.  Pulmonary &  Critical Care Medicine

## 2023-11-13 NOTE — Progress Notes (Signed)
 Daily Progress Note   Patient Name: Michele House       Date: 11/13/2023 DOB: May 10, 1956  Age: 68 y.o. MRN#: [id number] Attending Physician: Fausto Burnard DELENA DO Primary Care Physician: [facility], [facility] Admit Date: 10/02/2023  Reason for Consultation/Follow-up: Establishing goals of care  HPI/Brief Hospital Review: 68 y.o. female  with past medical history of COPD with chronic respiratory failure on 2L [place] at baseline, T2DM, HFpEF and hypertension admitted on 10/02/2023 with AMS.   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   Imaging also revealed right upper lode lung nodule   Developed respiratory distress on 12/23--found to have metapneumovirus, stabilized but unfortunately developed worsening respiratory distress and required mechanical ventilation S/p bronchoscopy 12/27 biopsy of lung nodule performed-very rare atypical cells and mostly reactive alveolar cells, concern for likely primary lung cancer   Course further complicated by acute anemia and development of shock S/p EGD oozing duodenal ulcer-clipped 12/26 S/p IR embolization to duodenal artery 12/28    Self extubated 12/27 soon after required reintubation   Successfully extubated 12/30, transferred out of ICU 1/2  Unfortunately has since been complicated by finding of acute DVT requiring IVC filter placement, potential seizure activity (history of seizure disorder but last seizure about 30 years ago) and underwent bronchoscopy with biopsy on 1/10   Palliative medicine was consulted for assisting with goals of care  conversations.  Subjective: Extensive chart review has been completed prior to meeting patient including labs, vital signs, imaging, progress notes, orders, and available advanced directive documents from current and previous encounters.    Visited with Michele House at her bedside. She is awake, alert, requesting food and drink. Assisted nursing staff at bedside. Michele House able to answer simple orientation questions, she is less engaging and interactive compared to previous visits. She denies acute pain or discomfort. Aware she had biopsy performed tomorrow and results will not be back for several days. She is hopeful to be discharged soon. No family at bedside during time of visit.  Called and spoke with daughter-Michele House. Notified primary team of Michele House's questions and concerns related to plan of care. [name] is hopeful for continued recovery. Answered and addressed all questions and  concerns. PMT to continue to follow for ongoing needs and support.  Thank you for allowing the Palliative Medicine Team to assist in the care of this patient.  Total time:  35 minutes  Time spent includes: Detailed review of medical records (labs, imaging, vital signs), medically appropriate exam (mental status, respiratory, cardiac, skin), discussed with treatment team, counseling and educating patient, family and staff, documenting clinical information, medication management and coordination of care.  Waddell Lesches, DNP, AGNP-C Palliative Medicine   Please contact Palliative Medicine Team phone at [phone] for questions and concerns.

## 2023-11-13 NOTE — Plan of Care (Signed)
  Problem: Education: Goal: Ability to describe self-care measures that may prevent or decrease complications (Diabetes Survival Skills Education) will improve Outcome: Progressing   Problem: Coping: Goal: Ability to adjust to condition or change in health will improve Outcome: Progressing   Problem: Fluid Volume: Goal: Ability to maintain a balanced intake and output will improve Outcome: Progressing   Problem: Health Behavior/Discharge Planning: Goal: Ability to identify and utilize available resources and services will improve Outcome: Progressing Goal: Ability to manage health-related needs will improve Outcome: Progressing   Problem: Metabolic: Goal: Ability to maintain appropriate glucose levels will improve Outcome: Progressing   Problem: Nutritional: Goal: Maintenance of adequate nutrition will improve Outcome: Progressing Goal: Progress toward achieving an optimal weight will improve Outcome: Progressing   Problem: Skin Integrity: Goal: Risk for impaired skin integrity will decrease Outcome: Progressing   Problem: Tissue Perfusion: Goal: Adequacy of tissue perfusion will improve Outcome: Progressing   Problem: Education: Goal: Knowledge of General Education information will improve Description: Including pain rating scale, medication(s)/side effects and non-pharmacologic comfort measures Outcome: Progressing   Problem: Health Behavior/Discharge Planning: Goal: Ability to manage health-related needs will improve Outcome: Progressing   Problem: Clinical Measurements: Goal: Ability to maintain clinical measurements within normal limits will improve Outcome: Progressing Goal: Will remain free from infection Outcome: Progressing Goal: Diagnostic test results will improve Outcome: Progressing Goal: Respiratory complications will improve Outcome: Progressing Goal: Cardiovascular complication will be avoided Outcome: Progressing   Problem: Activity: Goal:  Risk for activity intolerance will decrease Outcome: Progressing   Problem: Nutrition: Goal: Adequate nutrition will be maintained Outcome: Progressing   Problem: Coping: Goal: Level of anxiety will decrease Outcome: Progressing   Problem: Elimination: Goal: Will not experience complications related to bowel motility Outcome: Progressing Goal: Will not experience complications related to urinary retention Outcome: Progressing   Problem: Pain Management: Goal: General experience of comfort will improve Outcome: Progressing   Problem: Safety: Goal: Ability to remain free from injury will improve Outcome: Progressing   Problem: Skin Integrity: Goal: Risk for impaired skin integrity will decrease Outcome: Progressing   Problem: Education: Goal: Knowledge of disease or condition will improve Outcome: Progressing Goal: Knowledge of the prescribed therapeutic regimen will improve Outcome: Progressing Goal: Individualized Educational Video(s) Outcome: Progressing   Problem: Activity: Goal: Ability to tolerate increased activity will improve Outcome: Progressing Goal: Will verbalize the importance of balancing activity with adequate rest periods Outcome: Progressing   Problem: Respiratory: Goal: Ability to maintain a clear airway will improve Outcome: Progressing Goal: Levels of oxygenation will improve Outcome: Progressing Goal: Ability to maintain adequate ventilation will improve Outcome: Progressing   Problem: Activity: Goal: Ability to tolerate increased activity will improve Outcome: Progressing   Problem: Clinical Measurements: Goal: Ability to maintain a body temperature in the normal range will improve Outcome: Progressing   Problem: Respiratory: Goal: Ability to maintain adequate ventilation will improve Outcome: Progressing Goal: Ability to maintain a clear airway will improve Outcome: Progressing   Problem: Education: Goal: Knowledge of disease or  condition will improve Outcome: Progressing Goal: Knowledge of secondary prevention will improve (MUST DOCUMENT ALL) Outcome: Progressing Goal: Knowledge of patient specific risk factors will improve Loraine Leriche N/A or DELETE if not current risk factor) Outcome: Progressing   Problem: Ischemic Stroke/TIA Tissue Perfusion: Goal: Complications of ischemic stroke/TIA will be minimized Outcome: Progressing

## 2023-11-14 DIAGNOSIS — G9341 Metabolic encephalopathy: Secondary | ICD-10-CM | POA: Diagnosis not present

## 2023-11-14 DIAGNOSIS — R911 Solitary pulmonary nodule: Secondary | ICD-10-CM | POA: Diagnosis not present

## 2023-11-14 DIAGNOSIS — J441 Chronic obstructive pulmonary disease with (acute) exacerbation: Secondary | ICD-10-CM | POA: Diagnosis not present

## 2023-11-14 DIAGNOSIS — Z515 Encounter for palliative care: Secondary | ICD-10-CM | POA: Diagnosis not present

## 2023-11-14 LAB — GLUCOSE, CAPILLARY
Glucose-Capillary: 209 mg/dL — ABNORMAL HIGH (ref 70–99)
Glucose-Capillary: 251 mg/dL — ABNORMAL HIGH (ref 70–99)
Glucose-Capillary: 222 mg/dL — ABNORMAL HIGH (ref 70–99)
Glucose-Capillary: 399 mg/dL — ABNORMAL HIGH (ref 70–99)

## 2023-11-14 MED ORDER — LEVOTHYROXINE SODIUM 100 MCG PO TABS
200.0000 ug | ORAL_TABLET | Freq: Every day | ORAL | Status: DC
  Administered 2023-11-15 – 2023-11-16 (×2): 200 ug via ORAL
  Filled 2023-11-14 (×2): qty 2

## 2023-11-14 MED ORDER — LEVETIRACETAM 500 MG PO TABS
500.0000 mg | ORAL_TABLET | Freq: Two times a day (BID) | ORAL | Status: DC
Start: 2023-11-14 — End: 2023-12-03
  Administered 2023-11-14 – 2023-12-02 (×37): 500 mg via ORAL
  Filled 2023-11-14 (×37): qty 1

## 2023-11-14 NOTE — Plan of Care (Signed)
  Problem: Education: Goal: Ability to describe self-care measures that may prevent or decrease complications (Diabetes Survival Skills Education) will improve Outcome: Progressing   Problem: Coping: Goal: Ability to adjust to condition or change in health will improve Outcome: Progressing   Problem: Fluid Volume: Goal: Ability to maintain a balanced intake and output will improve Outcome: Progressing   Problem: Health Behavior/Discharge Planning: Goal: Ability to identify and utilize available resources and services will improve Outcome: Progressing Goal: Ability to manage health-related needs will improve Outcome: Progressing   Problem: Metabolic: Goal: Ability to maintain appropriate glucose levels will improve Outcome: Progressing   Problem: Nutritional: Goal: Maintenance of adequate nutrition will improve Outcome: Progressing Goal: Progress toward achieving an optimal weight will improve Outcome: Progressing   Problem: Skin Integrity: Goal: Risk for impaired skin integrity will decrease Outcome: Progressing   Problem: Tissue Perfusion: Goal: Adequacy of tissue perfusion will improve Outcome: Progressing   Problem: Education: Goal: Knowledge of General Education information will improve Description: Including pain rating scale, medication(s)/side effects and non-pharmacologic comfort measures Outcome: Progressing   Problem: Health Behavior/Discharge Planning: Goal: Ability to manage health-related needs will improve Outcome: Progressing   Problem: Clinical Measurements: Goal: Ability to maintain clinical measurements within normal limits will improve Outcome: Progressing Goal: Will remain free from infection Outcome: Progressing Goal: Diagnostic test results will improve Outcome: Progressing Goal: Respiratory complications will improve Outcome: Progressing Goal: Cardiovascular complication will be avoided Outcome: Progressing   Problem: Activity: Goal:  Risk for activity intolerance will decrease Outcome: Progressing   Problem: Nutrition: Goal: Adequate nutrition will be maintained Outcome: Progressing   Problem: Coping: Goal: Level of anxiety will decrease Outcome: Progressing   Problem: Elimination: Goal: Will not experience complications related to bowel motility Outcome: Progressing Goal: Will not experience complications related to urinary retention Outcome: Progressing   Problem: Pain Management: Goal: General experience of comfort will improve Outcome: Progressing   Problem: Safety: Goal: Ability to remain free from injury will improve Outcome: Progressing   Problem: Skin Integrity: Goal: Risk for impaired skin integrity will decrease Outcome: Progressing   Problem: Education: Goal: Knowledge of disease or condition will improve Outcome: Progressing Goal: Knowledge of the prescribed therapeutic regimen will improve Outcome: Progressing Goal: Individualized Educational Video(s) Outcome: Progressing   Problem: Activity: Goal: Ability to tolerate increased activity will improve Outcome: Progressing Goal: Will verbalize the importance of balancing activity with adequate rest periods Outcome: Progressing   Problem: Respiratory: Goal: Ability to maintain a clear airway will improve Outcome: Progressing Goal: Levels of oxygenation will improve Outcome: Progressing Goal: Ability to maintain adequate ventilation will improve Outcome: Progressing   Problem: Activity: Goal: Ability to tolerate increased activity will improve Outcome: Progressing   Problem: Clinical Measurements: Goal: Ability to maintain a body temperature in the normal range will improve Outcome: Progressing   Problem: Respiratory: Goal: Ability to maintain adequate ventilation will improve Outcome: Progressing Goal: Ability to maintain a clear airway will improve Outcome: Progressing   Problem: Education: Goal: Knowledge of disease or  condition will improve Outcome: Progressing Goal: Knowledge of secondary prevention will improve (MUST DOCUMENT ALL) Outcome: Progressing Goal: Knowledge of patient specific risk factors will improve Loraine Leriche N/A or DELETE if not current risk factor) Outcome: Progressing   Problem: Ischemic Stroke/TIA Tissue Perfusion: Goal: Complications of ischemic stroke/TIA will be minimized Outcome: Progressing

## 2023-11-14 NOTE — TOC Progression Note (Signed)
 Transition of Care [facility]) - Progression Note    Patient Details  Name: ZAKEYA JUNKER MRN: [id number] Date of Birth: [birth year]  [facility]) CM/SW Contact  Heron KATHEE Edison, RN Phone Number: 11/14/2023, 1:06 PM  Clinical Narrative: 11/14/23: DC order removed on 11/13/23. Provider will let CM when medical ready to start authorization. Is showing improvements. Will need ongoing Pt/OT and  updated notes prior to authorization when more medically ready.   Bing Edison MSN RN CM  Care Management Department.  [place]  [facility] Direct Dial: [phone] (Weekends Only) [facility] Main Office Phone: [phone] [facility] Fax: [phone]     Expected Discharge Plan: Skilled Nursing Facility Barriers to Discharge: Continued Medical Work up  Expected Discharge Plan and Services         Expected Discharge Date: 11/09/23                                     Social Determinants of Health (SDOH) Interventions SDOH Screenings   Food Insecurity: Patient Unable To Answer (10/03/2023)  Recent Concern: Food Insecurity - Food Insecurity Present (09/24/2023)  Housing: High Risk (10/03/2023)  Transportation Needs: Patient Unable To Answer (10/03/2023)  Recent Concern: Transportation Needs - Unmet Transportation Needs (09/24/2023)  Utilities: Patient Unable To Answer (10/03/2023)  Social Connections: Patient Declined (11/02/2023)  Tobacco Use: Medium Risk (11/12/2023)    Readmission Risk Interventions     No data to display

## 2023-11-14 NOTE — Progress Notes (Signed)
 Daily Progress Note   Patient Name: Michele House       Date: 11/14/2023 DOB: August 08, 1956  Age: 68 y.o. MRN#: [id number] Attending Physician: Fausto Burnard DELENA DO Primary Care Physician: [facility], [facility] Admit Date: 10/02/2023  Reason for Consultation/Follow-up: Establishing goals of care  HPI/Brief Hospital Review: 68 y.o. female  with past medical history of COPD with chronic respiratory failure on 2L [place] at baseline, T2DM, HFpEF and hypertension admitted on 10/02/2023 with AMS.   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   Imaging also revealed right upper lode lung nodule   Developed respiratory distress on 12/23--found to have metapneumovirus, stabilized but unfortunately developed worsening respiratory distress and required mechanical ventilation S/p bronchoscopy 12/27 biopsy of lung nodule performed-very rare atypical cells and mostly reactive alveolar cells, concern for likely primary lung cancer   Course further complicated by acute anemia and development of shock S/p EGD oozing duodenal ulcer-clipped 12/26 S/p IR embolization to duodenal artery 12/28    Self extubated 12/27 soon after required reintubation   Successfully extubated 12/30, transferred out of ICU 1/2   Unfortunately has since been complicated by finding of acute DVT requiring IVC filter placement, potential seizure activity (history of seizure disorder but last seizure about 30 years ago) and underwent bronchoscopy with biopsy on 1/10   Palliative medicine was consulted for assisting with goals of care  conversations.  Subjective: Extensive chart review has been completed prior to meeting patient including labs, vital signs, imaging, progress notes, orders, and available advanced directive documents from current and previous encounters.    Visited with Ms. Zaugg at her bedside. She is resting in bed with eyes closed and does not acknowledge my presence in room. No family at bedside during time of visit.   Called and spoke with daughter [name], reviewed as requested results of TSH and T4 and adjustments to Synthroid  dosing were made by primary team. Also assured her as requested antibody testing was being performed for possible autoimmune component.  Answered and addressed all questions and concerns. Provided [name] with PMT team phone number and encouraged her to reach out for concerns as they arise, if not I will plan to check  in on Friday of next week on my return.  Thank you for allowing the Palliative Medicine Team to assist in the care of this patient.  Total time:  25 minutes  Time spent includes: Detailed review of medical records (labs, imaging, vital signs), medically appropriate exam (mental status, respiratory, cardiac, skin), discussed with treatment team, counseling and educating patient, family and staff, documenting clinical information, medication management and coordination of care.  Waddell Lesches, DNP, AGNP-C Palliative Medicine   Please contact Palliative Medicine Team phone at [phone] for questions and concerns.

## 2023-11-14 NOTE — Plan of Care (Signed)
  Problem: Coping: Goal: Ability to adjust to condition or change in health will improve Outcome: Progressing   Problem: Metabolic: Goal: Ability to maintain appropriate glucose levels will improve Outcome: Progressing   Problem: Skin Integrity: Goal: Risk for impaired skin integrity will decrease Outcome: Progressing   Problem: Clinical Measurements: Goal: Will remain free from infection Outcome: Progressing Goal: Respiratory complications will improve Outcome: Progressing

## 2023-11-14 NOTE — Progress Notes (Signed)
 Progress Note   Patient: Michele House FMW:[id number] DOB: 1956-06-29 DOA: 10/02/2023     43 DOS: the patient was seen and examined on 11/14/2023   Brief hospital course:  Michele House is a 68 y.o. female with medical history significant of COPD, chronic respiratory failure on 2 L, hypertension, type 2 diabetes, HFpEF, recent discharge from the hospital 24th November 2024 after hospitalization for COPD exacerbation complicated by respiratory failure.  She presented to the hospital again on 10/02/2023 with chest pain, malaise, cough, shortness of breath, abdominal pain and dysuria.   She was admitted to the hospital for COPD exacerbation, acute on chronic hypoxemic respiratory failure, UTI and pneumonia.  She later developed worsening confusions, MRI brain revealed multiple acute strokes.   Prolonged hospital admission complicated by several issues including hemorrhagic shock requiring transfer to ICU for pressors and GI consultation. EGD showed duodenal ulcer/s that were treated with clipped but later required embolization with IR.  Pt also found with RLE DVT status post IVC filter placement given bleeding anticoagulation contraindicated.  For detailed hospital course and events, see [facility] progress note by Dr. Jens of 11/09/23.  Further hospital course and management as outlined below.   Assessment and Plan:  Acute metabolic encephalopathy: Improved. Initially completed 5 days of high-dose several IV Solu-Medrol  on 10/12/2023 Differential diagnosis include autoimmune encephalitis     Acute stroke with right hemiplegia: Off of Plavix  because of recent acute GI bleeding.   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   New Onset Seizure-like Activity - evening of 11/10/23 - see RN and cross  coverage notes --Neurology following - follow up on recommendations --Continue Keppra  500 mg BID --EEG with moderate diffuse encephalopathy --Seizure precautions --PRN Ativan  if seizure activity     Acute GI bleeding, bleeding duodenal ulcer:  S/p EGD on 10/28/2023 with clip placed.   S/p IR embolization on 10/30/2023.   --Continue Protonix . --Monitor CBC --Transfuse if Hbg < 7 or less than 8 with active bleeding or hemodynamic instability     Lower extremity pain, more severe in the left, right peroneal DVT: Patient complaining of pain in the lower extremities 1/7.  Initially refused ultrasound of the left lower extremity because of severe pain.  Ultrasound of the right lower extremity showed right calf DVT.  LLE U/S obtained 1/8 after pt agreed - no LLE DVT was seen. Due to high risk of recurrent GI bleeding given recent massive GI bleed requiring embolization by IR, unable to treat with anticoagulation. Vascular surgery placed IVC filter on 11/09/23     Hemorrhagic shock: Resolved Hypotension: Resolved.  Off of vasopressors    Acute blood loss anemia: H&H is stable.  S/p transfusion with 2 units of PRBCs on 10/28/2023, 1 unit of PRBCs on 10/29/2023.  S/p transfusion with 2 units of platelet pheresis on 10/29/2023. H&H is stable.     Pneumonia: Initially completed 5 days of IV ceftriaxone  and azithromycin  on 10/07/2023. She was restarted on broad-spectrum antibiotics (vancomycin , ceftriaxone  and Flagyl ) on 10/28/2023 when she decompensated requiring transfer to the ICU. Acute UTI: Urine culture showed strep agalactiae.  Completed antibiotics.     Acute on chronic diastolic CHF: Stable.  S/p treatment with IV Lasix .  Continue oral Lasix  Anxiety/panic attacks: Ativan  as needed.  Continue sertraline      COPD exacerbation: This had previously resolved.  However, she had recurrent COPD exacerbation and was restarted on steroids. Continue prednisone  taper through pulmonary 2025.      Acute on chronic hypoxemic respiratory failure, chronic hypercapnic respiratory failure: She was intubated on 10/28/2023 and extubated on 11/01/2023. She is tolerating 2 L/min oxygen via nasal cannula. She was strongly encouraged to use BiPAP every night to reduce risk of recurrent COPD exacerbation or worsening espiratory failure.     Type II DM with severe hyperglycemia, hypoglycemia blood sugar down to 48 on 11/09/2023: Decrease Semglee  from 10 units to 8 units daily.  Discontinue scheduled NovoLog  3 units with meals.  Use NovoLog  as needed for hyperglycemia.  Monitor glucose levels and adjust insulin  as needed. S/p treatment with IV insulin  drip. She may need less insulin  as prednisone  is being tapered off.     1.3 cm right upper lung spiculated nodule: S/p bronchoscopy on 10/20/2023.   Pathology was negative for malignancy and findings more consistent with reactive bronchial cells with acute inflammation. Pulmonologist is planning repeat lung biopsy -- today   Hypothyroidism -- pt has been on her Synthroid  100 mcg daily.  Daughter requested 1/11 to have thyroid  labs repeated, citing concern for possible Hashimoto's Encephalopathy causing patient's persistent mental status changes.  Advised acute setting and recent illnesses often make TSH and T4 abnormal and not very useful clinically. TSH on 10/06/23 was normal at 3.002 >> today 49.663 Free T4 on 10/06/23 minimally elevated at 1.24 >> today low at 0.38 --Increased Synthroid  to 200 mcg daily --Repeat TSH and free T4 in 6-8 weeks --Will check anti-TPO at daughter's request to determine if patient's hypothyroid is autoimmune      General weakness: PT and OT recommended discharge to SNF.          Subjective: Pt seen awake resting in bed this AM.  She denies acute complaints.      Physical Exam: Vitals:   11/14/23 0547 11/14/23 0812 11/14/23 0825 11/14/23 1128  BP:  111/60  (!) 113/58  Pulse:   86 96  Resp:  17 14 15   Temp:   97.8 F (36.6 C)  98 F (36.7 C)  TempSrc:  Axillary  Oral  SpO2:  95% 96% 92%  Weight: 66.8 kg     Height:       General exam: awake, alert, no acute distress, chronically ill appearaing HEENT: moist mucus membranes, hearing grossly normal  Respiratory system: CTAB no wheezes or rhonchi, diminished bases due to poor inspiratory volume, normal respiratory effort. Cardiovascular system: normal S1/S2, RRR, no peripheral edema Gastrointestinal system: soft, NT, ND, no HSM felt, +bowel sounds. Central nervous system: A&O x2+. no gross focal neurologic deficits, normal speech Skin: dry, intact, normal temperature, no rashes seen on visualized skin Psychiatry: normal mood, congruent affect   Data Reviewed:  Notable labs & studies --  TSH 49.663 Free T4 0.38  CBC with Hbg 9.3  CBG's labile, elevated: 101 >> 268 >> 388 >> 244 >> 209 >> 251    EEG -- no epileptic activity seen, showed moderate diffuse encephalopathy  U/S doppler LLE - negative for DVT  Pending --- repeat lung biopsy (1/10)     Family Communication: daughter updated by phone afternoon 1/9, 1/11.    Disposition: Status is: Inpatient  Remains inpatient appropriate because: Needs SNF placement, pending.    Planned Discharge Destination: Skilled nursing facility    Time spent: 38 minutes  Author: Burnard DELENA Cunning, DO 11/14/2023 3:16 PM  For on call review [email or web address].

## 2023-11-15 DIAGNOSIS — G9341 Metabolic encephalopathy: Secondary | ICD-10-CM | POA: Diagnosis not present

## 2023-11-15 LAB — CBC
HCT: 27.7 % — ABNORMAL LOW (ref 36.0–46.0)
Hemoglobin: 9.1 g/dL — ABNORMAL LOW (ref 12.0–15.0)
MCH: 29.6 pg (ref 26.0–34.0)
MCHC: 32.9 g/dL (ref 30.0–36.0)
MCV: 90.2 fL (ref 80.0–100.0)
Platelets: 204 10*3/uL (ref 150–400)
RBC: 3.07 MIL/uL — ABNORMAL LOW (ref 3.87–5.11)
RDW: 15.9 % — ABNORMAL HIGH (ref 11.5–15.5)
WBC: 4.3 10*3/uL (ref 4.0–10.5)
nRBC: 0 % (ref 0.0–0.2)

## 2023-11-15 LAB — GLUCOSE, CAPILLARY
Glucose-Capillary: 192 mg/dL — ABNORMAL HIGH (ref 70–99)
Glucose-Capillary: 295 mg/dL — ABNORMAL HIGH (ref 70–99)
Glucose-Capillary: 288 mg/dL — ABNORMAL HIGH (ref 70–99)
Glucose-Capillary: 200 mg/dL — ABNORMAL HIGH (ref 70–99)

## 2023-11-15 LAB — BASIC METABOLIC PANEL
Anion gap: 9 (ref 5–15)
BUN: 10 mg/dL (ref 8–23)
CO2: 32 mmol/L (ref 22–32)
Calcium: 8.7 mg/dL — ABNORMAL LOW (ref 8.9–10.3)
Chloride: 98 mmol/L (ref 98–111)
Creatinine, Ser: 0.54 mg/dL (ref 0.44–1.00)
GFR, Estimated: >60 mL/min (ref >60)
Glucose, Bld: 203 mg/dL — ABNORMAL HIGH (ref 70–99)
Potassium: 3.8 mmol/L (ref 3.5–5.1)
Sodium: 139 mmol/L (ref 135–145)

## 2023-11-15 MED ORDER — INSULIN ASPART 100 UNIT/ML IJ SOLN
3.0000 [IU] | Freq: Three times a day (TID) | INTRAMUSCULAR | Status: DC
  Administered 2023-11-15 – 2023-11-16 (×3): 3 [IU] via SUBCUTANEOUS
  Filled 2023-11-15 (×3): qty 1

## 2023-11-15 MED ORDER — CAPSAICIN 0.025 % EX CREA
TOPICAL_CREAM | Freq: Once | CUTANEOUS | Status: AC
  Filled 2023-11-15: qty 1

## 2023-11-15 MED ORDER — INSULIN GLARGINE-YFGN 100 UNIT/ML ~~LOC~~ SOLN
10.0000 [IU] | Freq: Every day | SUBCUTANEOUS | Status: DC
  Administered 2023-11-16: 10 [IU] via SUBCUTANEOUS
  Filled 2023-11-15: qty 0.1

## 2023-11-15 MED ORDER — HYDROCODONE-ACETAMINOPHEN 5-325 MG PO TABS
1.0000 | ORAL_TABLET | Freq: Four times a day (QID) | ORAL | Status: DC | PRN
  Administered 2023-11-15 – 2023-12-02 (×21): 1 via ORAL
  Filled 2023-11-15 (×22): qty 1

## 2023-11-15 NOTE — Progress Notes (Signed)
 NAME:  Michele House, MRN:  [id number], DOB:  01-11-1956, LOS: 44 ADMISSION DATE:  10/02/2023   History of Present Illness:  Case of a 68 year old female patient with a past medical history of COPD with chronic respiratory failure on 2 L nasal cannula, hypertension, type 2 diabetes, heart failure with preserved EF who has been here for a month.  Initially presented with altered mental status encephalopathy.  Had an extensive workup including brain MRI which was negative for any acute intracranial abnormality initially.  However unfortunately course complicated by acute CVA with repeat MRI showing multiple acute infarcts in the high bilateral posterior frontal and parietal lobes.  She recovered from that but course further complicated by waxing and waning mental status which prompted an LP to rule out encephalitis and has been unremarkable so far.  She was found to have right upper lobe lung nodule which was concerning for malignancy and underwent robotic assisted navigational bronchoscopy with biopsy on 12/27 which showed very rare atypical cells and reactive alveolar cells mostly.    Pertinent  Medical History  As abnove  Significant Hospital Events: Including procedures, antibiotic start and stop dates in addition to other pertinent events   12/01: Vital stable, respiratory viral panel negative, procalcitonin negative, preliminary blood cultures negative.  Urine cultures are ordered as add-on, ammonia levels mildly elevated at 39, strep pneumo negative, CBG elevated at 244, history of enlarging pulmonary nodule with recommendations for PET/CT during most recent admission which has not been done yet, if video bronchoscopy was scheduled for 10/13/2023 as outpatient. 12/02: Unable to take care of herself and son cannot provide the required care as she required 24-hour supervision due to worsening dementia.  PT is recommending SNF. 12/03: Blood pressure started trending up, restarting home  Zestoretic .  Urine cultures with strep agalactiae, penicillin allergy noted, will continue with ceftriaxone  for now.  Also started on Remeron  for concern of worsening depression and unable to sleep at night  12/05:  Patient has some blurry vision and weakness and pain.  MRI of the brain negative for acute intracranial abnormality. 12/08:  Patient more confused today than yesterday.  Able to follow some simple commands but not as good of a conversation today as yesterday. 12/09:  Patient still confused today.  Does not follow simple commands today. 12/10: Patient more talkative today but was looking up at the ceiling when she was talking with me.  She does not move her extremities to command but does move them on her own.  Physical therapy and Occupational Therapy did not see her move her right arm. 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  Mild associated petechial hemorrhage.  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 12/12: Patient with new stroke, CTA head and neck with no large vessel occlusion.  Patient had a recent echocardiogram which was normal. Concern of paraneoplastic versus hypercoagulability secondary to malignancy.  Patient missed her appointment for bronchoscopy and outpatient appointment for PET scan due to recurrent hospitalizations. 12/13: Patient continued to have waxing and waning mental status, unable to move right upper and lower extremities.  Having some hallucination.  Pulmonary is planning lung biopsy on 12/27 if she remains stable.  Rapidly progressive dementia and now with underlying stroke, question of paraneoplastic encephalitis and hypercoagulability secondary to underlying undiagnosed malignancy. 12/15: Patient with much  improved mentation.  Having some flickering of movements on right  upper and lower extremity today, left extremities weaker but seems improving.   12/18: Pt underwent bronchoscopy and lung biopsy 12/19: patient stable , she is being optimized for dc for rehab.  She's cleared from pulmonary for discharge. Results from bronch will take 1 week.   12/26: Pt transferred to the stepdown unit with acute hypercapnic respiratory requiring Bipap but subsequently required mechanical intubation due to worsening respiratory failure.  Insulin  gtt initiated due to severe hyperglycemia.  With hemorrhagic shock due to bleeding duodenal ulcer, underwent emergent EGD with successful hemostasis by injection of Epinephrine  and hemostatic clip. 12/27: Hgb slowly trending down, no report of bleeding from OG suction nor melena. Weaning down vasopressors, if able to continue to wean vasopressors, then can consider WUA/SBT.  Giving temporizing measures for Hyperkalemia. Self extubated but required reintubation. Hgb continued to trend down, given 1 unit pRBC's and 2 FFP.  IR consulted for consideration for embolization. 12/28: Weaned off Vasopressin , Levophed  weaning down.  S/p IR embolization of the Duodenal artery. Peak pressures and lung sounds improved with Ketamine , will keep intubated today given rapid reintubation yesterday. 12/30- patient on PRVC FiO2 28%, weaning off ketamine  and hoping to perform SBT today.  Holding feeds until post SBT.  11/02/23- patient is stable overnight no acute events, electrllytes improved.  No GI bleeds noted.  Opitimizing for TRH today.  11/03/23 patient is more interactive this am, still has not been able to get OOB.  Will continue to follow her from pulmonary with COPD and lung cancer. Her pathology was sent for second opinion due to mixed reporting from cytotech and pathology report.  11/03/22- patient is good spirits this am. Son came to visit yesterday and we reivewed medical plan. H/h is stable. Following peripherally from pulmonary perspective for COPD and  lung mass.  11/05/23- patient on 3L/min.  She still has 9 day old Right internal jugular central line.  I have placed orders for RN to remove this line today.  She required BIPAP yesterday but states she did not feel SOB>  11/06/23- patient shares she feels well today.  She is on 3L/min but takes it off at times. She reports breathing feels improved 11/07/23- patient with no acute events overnight.  H/h is improved 11/08/23- patient seen at bedside.  Sitting up in bed in no distress denies dyspnea.  She is on nasal canula 2L/min.  She asks if we can help her get glasses and dentures.  I received inconclusive results from lung biopsy. I discussed this with patient.  We discussed repeating the biopsy and patient states she wants this done as soon as possible. She is clinically stable.  Diagnosis Lung, biopsy, Right upper lobe - VERY RARE ATYPICAL CELLS, FAVOR REACTIVE. - FRAGMENTS OF ALVEOLATED LUNG AND BLOOD CLOT. Diagnosis Note Per [facility] recent imaging revealed an enlarging spiculated 1.6 cm right upper lung lesion. This case underwent intradepartmental consultation and Dr. Reed concurs with the interpretation. See concurrent case [id number]. Rexene  11/09/23- patient with acute dvt.  Not good candidate for Antcoagulation per GI due to upper GI bleed with duod ulcer 11/10/23- plant for lung biopsy on 11/12/23- reviewed with patient.  NPO Thursday night at midnight  11/11/23- I evaluated patient at bedside, she reports no acute events.  We discussed lung biopsy she is agreeable and wishes to proceed.  She asked if I can help her get glasses and new dentures and I did discuss this with  her son 08/11/24- patient evaluated prior to procedure , on room air saturating 92%. No questions regarding about procedure 11/13/23-patient is on 2L/min [place].  She appears to be in good spirits asking to be discharged.  She does report partial weakness on left side post CVA but on examination from me does not seem to have FND.   She has PT/OT ordered.  She can likely be weaned from oxygen prior to dc home. Her pathology results from lung biopsy are in process.  11/15/23- patient seen at bedside , stable on 3L.  She is being optimized for dc to SNF  Objective   Blood pressure 135/76, pulse 87, temperature 98.3 F (36.8 C), temperature source Oral, resp. rate 14, height 5' (1.524 m), weight 65.9 kg, last menstrual period 12/24/1998, SpO2 (!) 88%.        Intake/Output Summary (Last 24 hours) at 11/15/2023 0831 Last data filed at 11/14/2023 2100 Gross per 24 hour  Intake 240 ml  Output --  Net 240 ml   Filed Weights   11/14/23 0528 11/14/23 0547 11/15/23 0400  Weight: 67 kg 66.8 kg 65.9 kg    Examination: General: Awake, follows verbal communication HENT: Supple neck reactive pupils  Lungs: mild wheezing b/l Cardiovascular: Normal S1, Normal S2, RRR  Abdomen: Soft, non tender, non distended, +BS  Extremities: Warm and well perfused no edema.   Labs and imaging were reviewed.   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  This has been sent for second opinion to be reviewed.  I think its most likely primary lung cancer.  S/p lung biopsy, on 2L/min [place] saturating 100% can be weaned off as per RT   Best Practice (right click and Reselect all SmartList Selections daily)   Diet/type: NPO DVT prophylaxis SCD Pressure ulcer(s): N/A GI prophylaxis: PPI Lines: Central line Foley:  Yes, and it is  still needed Code Status:  full code     Kieu Quiggle, M.D.  Pulmonary & Critical Care Medicine

## 2023-11-15 NOTE — Progress Notes (Signed)
 Progress Note   Patient: Michele House FMW:[id number] DOB: 1956/09/28 DOA: 10/02/2023     44 DOS: the patient was seen and examined on 11/15/2023   Brief hospital course:  Michele House is a 68 y.o. female with medical history significant of COPD, chronic respiratory failure on 2 L, hypertension, type 2 diabetes, HFpEF, recent discharge from the hospital 24th November 2024 after hospitalization for COPD exacerbation complicated by respiratory failure.  She presented to the hospital again on 10/02/2023 with chest pain, malaise, cough, shortness of breath, abdominal pain and dysuria.   She was admitted to the hospital for COPD exacerbation, acute on chronic hypoxemic respiratory failure, UTI and pneumonia.  She later developed worsening confusions, MRI brain revealed multiple acute strokes.   Prolonged hospital admission complicated by several issues including hemorrhagic shock requiring transfer to ICU for pressors and GI consultation. EGD showed duodenal ulcer/s that were treated with clipped but later required embolization with IR.  Pt also found with RLE DVT status post IVC filter placement given bleeding anticoagulation contraindicated.  For detailed hospital course and events, see [facility] progress note by Dr. Jens of 11/09/23.  Further hospital course and management as outlined below.   Assessment and Plan:  Acute metabolic encephalopathy: Improved. Completed course of high-dose IV Solu-Medrol  on 10/12/2023 per Neurology Differential diagnosis included autoimmune encephalitis --Delirium precuations --Avoid benzo's & sedating meds   Acute stroke with right hemiplegia: Off of Plavix  because of recent acute GI bleeding.   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  New Onset Seizure-like  Activity - evening of 11/10/23 - see RN and cross coverage notes --Neurology following - follow up on recommendations --Continue Keppra  500 mg BID --EEG with moderate diffuse encephalopathy --Seizure precautions --PRN Ativan  if seizure activity   Acute GI bleeding, bleeding duodenal ulcer:  S/p EGD on 10/28/2023 with clip placed.   S/p IR embolization on 10/30/2023.   --Continue Protonix . --Monitor CBC --Transfuse if Hbg < 7 or less than 8 with active bleeding or hemodynamic instability   Lower extremity pain, more severe in the left, right peroneal DVT: Patient complaining of pain in the lower extremities 1/7.  Initially refused ultrasound of the left lower extremity because of severe pain.  Ultrasound of the right lower extremity showed right calf DVT.  LLE U/S obtained 1/8 after pt agreed - no LLE DVT was seen. Due to high risk of recurrent GI bleeding given recent massive GI bleed requiring embolization by IR, unable to treat with anticoagulation. Vascular surgery placed IVC filter on 11/09/23   Hemorrhagic shock: Resolved Hypotension: Resolved.  Off of vasopressors  Acute blood loss anemia: H&H is stable.  S/p transfusion with 2 units of PRBCs on 10/28/2023, 1 unit of PRBCs on 10/29/2023.  S/p transfusion with 2 units of platelet pheresis on 10/29/2023. H&H is stable.   Pneumonia: Initially completed 5 days of IV ceftriaxone  and azithromycin  on 10/07/2023. She was restarted on broad-spectrum antibiotics (vancomycin , ceftriaxone  and Flagyl ) on 10/28/2023 when she decompensated requiring transfer to the ICU. Acute UTI: Urine culture showed strep agalactiae.  Completed antibiotics.   Acute on chronic diastolic CHF: Stable.   S/p diuresis with IV Lasix .  Continue oral Lasix  20 mg daily Monitor volume status  COPD exacerbation: This had previously resolved, but required additional course of steroids. Pulmonology following Completed 2nd course w/ prednisone   taper  Now stable, monitor  closely   Acute on chronic hypoxemic respiratory failure, chronic hypercapnic respiratory failure: She was intubated on 10/28/2023 and extubated on 11/01/2023. Requiring 2 L/min oxygen via nasal cannula. Continues to desat to low/mid 80's when tried on room air. --Supplement O2, target sats > 88% --Bipap nightly   Type II DM with severe hyperglycemia, hypoglycemia  --Appreciate diabetes coordinator input. --Semglee  10 units at bedtime --Add Novolog  3 units TID WC, hold if < 50% meal consumed --Sliding scale Novolog  0-9 AC / 0-5 HS  Anxiety/panic attacks: improved.   Avoid benzo's due to waxing and waning mental status.   On Zoloft   1.3 cm right upper lung spiculated nodule: S/p bronchoscopy on 10/20/2023.   Pathology was negative for malignancy and findings more consistent with reactive bronchial cells with acute inflammation. ---Lung biopsy repeated 1/10 -- follow up path results  Hypothyroidism -- pt has been on her Synthroid  100 mcg daily.  Daughter requested 1/11 to have thyroid  labs repeated, citing concern for possible Hashimoto's Encephalopathy causing patient's persistent mental status changes.  Advised acute setting and recent illnesses often make TSH and T4 abnormal and not very useful clinically. TSH on 10/06/23 was normal at 3.002 >> today 49.663 Free T4 on 10/06/23 minimally elevated at 1.24 >> today low at 0.38 --Increased Synthroid  to 200 mcg daily --Repeat TSH and free T4 in 6-8 weeks --Pending anti-TPO (daughter's request to determine if patient's hypothyroid is autoimmune)     General weakness: PT and OT recommend discharge to SNF.          Subjective: Pt seen awake resting in bed this AM, asking to go home.  Denies acute complaints.  RN reproted pt had 10/10 right arm pain earlier this AM. Pt states medication given has helped.   Physical Exam: Vitals:   11/15/23 1204 11/15/23 1205 11/15/23 1208 11/15/23 1739  BP:  (!) 114/56  132/71  Pulse:    91  Resp:  13 13  15   Temp:   98.5 F (36.9 C) 97.8 F (36.6 C)  TempSrc:   Oral Oral  SpO2:  90%  95%  Weight:      Height:       General exam: awake, alert, no acute distress, chronically ill appearaing HEENT: moist mucus membranes, hearing grossly normal  Respiratory system: CTAB no wheezes or rhonchi, diminished bases due to poor inspiratory volume, normal respiratory effort. Cardiovascular system: normal S1/S2, RRR, no peripheral edema Gastrointestinal system: soft, NT, ND, no HSM felt, +bowel sounds. Central nervous system: A&O x 2+. no gross focal neurologic deficits, normal speech Skin: dry, intact, normal temperature, no rashes seen on visualized skin Psychiatry: normal mood, congruent affect   Data Reviewed:  Notable labs & studies --   normal BMP except glucose 203 and Ca 8.7  CBC with Hbg 9.1 stable  CBG's labile, elevated: 251 >> 399 >> 192 >> 295 >> 288  1/12  TSH 49.663 Free T4 0.38   EEG -- no epileptic activity seen, showed moderate diffuse encephalopathy  U/S doppler LLE - negative for DVT  Pending --- repeat lung biopsy (1/10)     Family Communication: daughter updated by phone afternoon 1/9, 1/11.  Will attempt to call as time allows.   Disposition: Status is: Inpatient  Remains inpatient appropriate because: Needs SNF placement, pending.    Planned Discharge Destination: Skilled nursing facility    Time spent: 35 minutes  Author: Burnard DELENA Cunning, DO 11/15/2023 5:58 PM  For on  call review [email or web address].

## 2023-11-15 NOTE — Progress Notes (Signed)
 Physical Therapy Treatment Patient Details Name: Michele House MRN: [id number] DOB: 05-Nov-1955 Today's Date: 11/15/2023   History of Present Illness Patient is a 68 year old with prolonged hospital stay, originally admitted with AMS. 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 She developed respiratory distress requiring mechanical ventilation. Self extubated 12/27 with re-intubation and extubation on 12/30. Pt also found with RLE DVT status post IVC filter placement given bleeding anticoagulation contraindicated. Seizure on 1/8 with transfer to 2nd floor.    PT Comments  Pt resting in bed upon PT arrival; pt reporting wanting to get OOB and walk.  During session pt max assist with bed mobility and max to total assist for sitting balance (posterior R lean).  Pt's bed linen noted to be wet (purewick did not appear to be working); pt assisted back to bed and pt's gown and bed linen's changed with NT assist.  No c/o pain during session.  Will continue to focus on strengthening, balance, and progressive functional mobility during hospitalization.    If plan is discharge home, recommend the following: Two people to help with walking and/or transfers;Two people to help with bathing/dressing/bathroom;Assistance with cooking/housework;Direct supervision/assist for medications management;Assistance with feeding;Direct supervision/assist for financial management;Assist for transportation;Help with stairs or ramp for entrance;Supervision due to cognitive status   Can travel by private vehicle     No  Equipment Recommendations  Other (comment) (TBD at next venue of care)    Recommendations for Other Services       Precautions / Restrictions Precautions Precautions: Fall Precaution Comments: right side hemiparesis Restrictions Weight Bearing Restrictions Per Provider Order: No     Mobility  Bed Mobility Overal bed mobility: Needs Assistance Bed Mobility:  Rolling Rolling: Max assist (logrolling L/R in bed to change sheets)   Supine to sit: Max assist Sit to supine: Max assist   General bed mobility comments: assist for trunk and B LE's; vc's for technique    Transfers                   General transfer comment: Not appropriate at this time (d/t level of assist for sitting balance)    Ambulation/Gait                   Stairs             Wheelchair Mobility     Tilt Bed    Modified Rankin (Stroke Patients Only)       Balance Overall balance assessment: Needs assistance Sitting-balance support: Bilateral upper extremity supported, Feet supported Sitting balance-Leahy Scale: Zero Sitting balance - Comments: Pt sat on EOB for a few minutes with max to total assist (d/t posterior R lean); vc's for technique to improve positioning for balance (pt only able to briefly attempt d/t c/o dizziness)                                    Cognition Arousal: Alert Behavior During Therapy: Impulsive Overall Cognitive Status: No family/caregiver present to determine baseline cognitive functioning Area of Impairment: Following commands, Problem solving                       Following Commands: Follows one step commands with increased time     Problem Solving: Slow processing, Decreased initiation, Difficulty sequencing, Requires verbal cues, Requires tactile cues General Comments: Pt oriented  to self, hospital, and some of situation        Exercises      General Comments  Nursing cleared pt for participation in physical therapy.  Pt agreeable to PT session.      Pertinent Vitals/Pain Pain Assessment Pain Assessment: No/denies pain Pain Intervention(s): Limited activity within patient's tolerance, Monitored during session, Repositioned HR stable during session.  Pt's SpO2 sats 88-89% on room air on arrival; pt's SpO2 sats 98% or greater on 1 L O2 via nasal cannula during session; pt  kept on 1 L O2 end of session (nurse notified).    Home Living                          Prior Function            PT Goals (current goals can now be found in the care plan section) Acute Rehab PT Goals Patient Stated Goal: pt did not state goal PT Goal Formulation: With patient Time For Goal Achievement: 11/26/23 Progress towards PT goals: Progressing toward goals    Frequency    Min 1X/week      PT Plan      Co-evaluation              AM-PAC PT 6 Clicks Mobility   Outcome Measure  Help needed turning from your back to your side while in a flat bed without using bedrails?: A Lot Help needed moving from lying on your back to sitting on the side of a flat bed without using bedrails?: Total Help needed moving to and from a bed to a chair (including a wheelchair)?: Total Help needed standing up from a chair using your arms (e.g., wheelchair or bedside chair)?: Total Help needed to walk in hospital room?: Total Help needed climbing 3-5 steps with a railing? : Total 6 Click Score: 7    End of Session Equipment Utilized During Treatment: Oxygen (1 L via nasal cannula) Activity Tolerance: Patient tolerated treatment well Patient left: in bed;with call bell/phone within reach;with bed alarm set;with SCD's reapplied;Other (comment) (R UE elevated via pillow; B heels floating via pillow support) Nurse Communication: Mobility status;Precautions;Other (comment) (pt needing purewick; pt left on 1 L O2 via nasal cannula) PT Visit Diagnosis: Unsteadiness on feet (R26.81);Other abnormalities of gait and mobility (R26.89);Muscle weakness (generalized) (M62.81);Difficulty in walking, not elsewhere classified (R26.2);Hemiplegia and hemiparesis Hemiplegia - Right/Left: Right Hemiplegia - dominant/non-dominant: Dominant Hemiplegia - caused by: Cerebral infarction     Time: 1632-1710 PT Time Calculation (min) (ACUTE ONLY): 38 min  Charges:    $Therapeutic Activity:  38-52 mins PT General Charges $$ ACUTE PT VISIT: 1 Visit                     Damien Caulk, PT 11/15/23, 5:29 PM

## 2023-11-15 NOTE — Inpatient Diabetes Management (Signed)
 Inpatient Diabetes Program Recommendations  AACE/ADA: New Consensus Statement on Inpatient Glycemic Control (2015)  Target Ranges:  Prepandial:   less than 140 mg/dL      Peak postprandial:   less than 180 mg/dL (1-2 hours)      Critically ill patients:  140 - 180 mg/dL    Latest Reference Range & Units 11/14/23 09:03 11/14/23 13:15 11/14/23 15:46 11/14/23 22:10  Glucose-Capillary 70 - 99 mg/dL 790 (H)  3 units Novolog   8 units Semglee   251 (H)  5 units Novolog   222 (H)  3 units Novolog   399 (H)  4 units Novolog    (H): Data is abnormally high  Latest Reference Range & Units 11/15/23 09:17  Glucose-Capillary 70 - 99 mg/dL 807 (H)  2 units Novolog   8 units Semglee   (H): Data is abnormally high     Home DM Meds: Glipizide  10 mg daily     Tradjenta  5 mg daily   Current Orders: Semglee  8 units daily      Novolog  Sensitive Correction Scale/ SSI (0-9 units) TID AC + HS    MD- Please consider:  1. Increase Semglee  slightly to 10 units daily  2. Start Novolog  Meal Covergae: Novolog  3 units TID with meals HOLD if pt NPO HOLD if pt eats <50% meals    --Will follow patient during hospitalization--  Adina Rudolpho Arrow RN, MSN, CDCES Diabetes Coordinator Inpatient Glycemic Control Team Team Pager: [phone] (8a-5p)

## 2023-11-15 NOTE — Plan of Care (Signed)
   Problem: Education: Goal: Ability to describe self-care measures that may prevent or decrease complications (Diabetes Survival Skills Education) will improve Outcome: Progressing   Problem: Metabolic: Goal: Ability to maintain appropriate glucose levels will improve Outcome: Progressing   Problem: Nutritional: Goal: Maintenance of adequate nutrition will improve Outcome: Progressing   Problem: Skin Integrity: Goal: Risk for impaired skin integrity will decrease Outcome: Progressing

## 2023-11-16 DIAGNOSIS — G9341 Metabolic encephalopathy: Secondary | ICD-10-CM | POA: Diagnosis not present

## 2023-11-16 LAB — GLUCOSE, CAPILLARY
Glucose-Capillary: 254 mg/dL — ABNORMAL HIGH (ref 70–99)
Glucose-Capillary: 246 mg/dL — ABNORMAL HIGH (ref 70–99)
Glucose-Capillary: 201 mg/dL — ABNORMAL HIGH (ref 70–99)
Glucose-Capillary: 114 mg/dL — ABNORMAL HIGH (ref 70–99)

## 2023-11-16 LAB — CYTOLOGY - NON PAP

## 2023-11-16 LAB — THYROID PEROXIDASE ANTIBODY: Thyroperoxidase Ab SerPl-aCnc: 28 [IU]/mL (ref 0–34)

## 2023-11-16 LAB — SURGICAL PATHOLOGY

## 2023-11-16 MED ORDER — LEVOTHYROXINE SODIUM 150 MCG PO TABS
150.0000 ug | ORAL_TABLET | Freq: Every day | ORAL | Status: DC
  Administered 2023-11-17 – 2023-12-02 (×15): 150 ug via ORAL
  Filled 2023-11-16: qty 3
  Filled 2023-11-16 (×2): qty 1
  Filled 2023-11-16: qty 3
  Filled 2023-11-16: qty 1
  Filled 2023-11-16 (×3): qty 3
  Filled 2023-11-16: qty 1
  Filled 2023-11-16: qty 3
  Filled 2023-11-16: qty 1
  Filled 2023-11-16: qty 3
  Filled 2023-11-16 (×7): qty 1
  Filled 2023-11-16 (×2): qty 3
  Filled 2023-11-16 (×4): qty 1
  Filled 2023-11-16 (×4): qty 3

## 2023-11-16 MED ORDER — INSULIN ASPART 100 UNIT/ML IJ SOLN
4.0000 [IU] | Freq: Three times a day (TID) | INTRAMUSCULAR | Status: DC
  Administered 2023-11-16 – 2023-12-02 (×45): 4 [IU] via SUBCUTANEOUS
  Filled 2023-11-16 (×43): qty 1

## 2023-11-16 MED ORDER — PANTOPRAZOLE SODIUM 40 MG PO TBEC
40.0000 mg | DELAYED_RELEASE_TABLET | Freq: Two times a day (BID) | ORAL | Status: DC
  Administered 2023-11-16 – 2023-12-02 (×34): 40 mg via ORAL
  Filled 2023-11-16 (×34): qty 1

## 2023-11-16 MED ORDER — INSULIN GLARGINE-YFGN 100 UNIT/ML ~~LOC~~ SOLN
12.0000 [IU] | Freq: Every day | SUBCUTANEOUS | Status: DC
  Administered 2023-11-17 – 2023-11-18 (×2): 12 [IU] via SUBCUTANEOUS
  Filled 2023-11-16 (×2): qty 0.12

## 2023-11-16 NOTE — Progress Notes (Signed)
 Physical Therapy Treatment Patient Details Name: Michele House MRN: [id number] DOB: 1956-06-24 Today's Date: 11/16/2023   History of Present Illness Patient is a 68 year old with prolonged hospital stay, originally admitted with AMS. 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 She developed respiratory distress requiring mechanical ventilation. Self extubated 12/27 with re-intubation and extubation on 12/30. Pt also found with RLE DVT status post IVC filter placement given bleeding anticoagulation contraindicated. Seizure on 1/8 with transfer to 2nd floor.    PT Comments  Pt resting in bed upon therapy arrival; PT/OT co-session performed.  During session pt max assist x2 for bed mobility and max assist x1-2 for sitting balance (d/t posterior and R lean) for about 15 minutes.  Cueing given for pt to utilize L UE/hand on bed to assist with sitting balance but pt had difficulty focusing to do this.  Attempted leaning to L onto pt's L elbow/forearm (in sitting) and pt pushing back up to upright sitting (x3 trials): pt requiring 1-2 assist for this activity and cueing.  Performed L LE ex's in sitting with assist as needed.  Impaired awareness of deficits noted.  Will continue to focus on strengthening, balance, and progressive functional mobility during hospitalization.   If plan is discharge home, recommend the following: Two people to help with walking and/or transfers;Two people to help with bathing/dressing/bathroom;Assistance with cooking/housework;Direct supervision/assist for medications management;Assistance with feeding;Direct supervision/assist for financial management;Assist for transportation;Help with stairs or ramp for entrance;Supervision due to cognitive status   Can travel by private vehicle     No  Equipment Recommendations  Other (comment) (TBD at next venue of care)    Recommendations for Other Services       Precautions / Restrictions  Precautions Precautions: Fall Precaution Comments: right side hemiparesis Restrictions Weight Bearing Restrictions Per Provider Order: No     Mobility  Bed Mobility Overal bed mobility: Needs Assistance Bed Mobility: Supine to Sit, Sit to Supine     Supine to sit: Max assist, +2 for physical assistance Sit to supine: Max assist, +2 for physical assistance   General bed mobility comments: assist for trunk and B LE's; vc's for technique    Transfers                   General transfer comment: unsafe to attempt on this date    Ambulation/Gait                   Stairs             Wheelchair Mobility     Tilt Bed    Modified Rankin (Stroke Patients Only)       Balance Overall balance assessment: Needs assistance Sitting-balance support: Bilateral upper extremity supported, Feet supported Sitting balance-Leahy Scale: Zero Sitting balance - Comments: Max assist +1-2 for static sitting balance; posterior and R lean; pt c/o dizziness in sitting (BP 128/89)       Standing balance comment: unable/unsafe                            Cognition Arousal: Alert Behavior During Therapy: Impulsive Overall Cognitive Status: No family/caregiver present to determine baseline cognitive functioning Area of Impairment: Attention, Memory, Following commands, Safety/judgement, Awareness                   Current Attention Level: Sustained Memory: Decreased short-term memory Following Commands: Follows one step  commands with increased time Safety/Judgement: Decreased awareness of deficits, Decreased awareness of safety Awareness: Intellectual Problem Solving: Slow processing, Decreased initiation, Difficulty sequencing, Requires verbal cues, Requires tactile cues          Exercises General Exercises - Lower Extremity Long Arc Quad: AROM, Strengthening, Left, 10 reps, Seated Hip Flexion/Marching: AAROM, Strengthening, Left, 10 reps,  Seated    General Comments      Pertinent Vitals/Pain Pain Assessment Pain Assessment: No/denies pain Pain Intervention(s): Limited activity within patient's tolerance, Monitored during session, Repositioned VSS (HR and SpO2 on 2 L via nasal cannula) during session.    Home Living                          Prior Function            PT Goals (current goals can now be found in the care plan section) Acute Rehab PT Goals Patient Stated Goal: to be able to get to chair PT Goal Formulation: With patient Time For Goal Achievement: 11/26/23 Potential to Achieve Goals: Fair Progress towards PT goals: Progressing toward goals    Frequency    Min 1X/week      PT Plan      Co-evaluation PT/OT/SLP Co-Evaluation/Treatment: Yes Reason for Co-Treatment: Complexity of the patient's impairments (multi-system involvement);Necessary to address cognition/behavior during functional activity;For patient/therapist safety;To address functional/ADL transfers PT goals addressed during session: Mobility/safety with mobility;Balance OT goals addressed during session: ADL's and self-care      AM-PAC PT 6 Clicks Mobility   Outcome Measure  Help needed turning from your back to your side while in a flat bed without using bedrails?: A Lot Help needed moving from lying on your back to sitting on the side of a flat bed without using bedrails?: Total Help needed moving to and from a bed to a chair (including a wheelchair)?: Total Help needed standing up from a chair using your arms (e.g., wheelchair or bedside chair)?: Total Help needed to walk in hospital room?: Total Help needed climbing 3-5 steps with a railing? : Total 6 Click Score: 7    End of Session Equipment Utilized During Treatment: Oxygen (2 L) Activity Tolerance: Patient limited by fatigue Patient left: in bed;with call bell/phone within reach;with bed alarm set;with SCD's reapplied;Other (comment) (R UE elevated via  pillow support; B heels floating via pillow support) Nurse Communication: Mobility status;Precautions PT Visit Diagnosis: Unsteadiness on feet (R26.81);Other abnormalities of gait and mobility (R26.89);Muscle weakness (generalized) (M62.81);Difficulty in walking, not elsewhere classified (R26.2);Hemiplegia and hemiparesis Hemiplegia - Right/Left: Right Hemiplegia - dominant/non-dominant: Dominant Hemiplegia - caused by: Cerebral infarction     Time: 8672-8642 PT Time Calculation (min) (ACUTE ONLY): 30 min  Charges:    $Therapeutic Activity: 8-22 mins PT General Charges $$ ACUTE PT VISIT: 1 Visit                     Damien Caulk, PT 11/16/23, 4:07 PM

## 2023-11-16 NOTE — Plan of Care (Signed)
  Problem: Education: Goal: Ability to describe self-care measures that may prevent or decrease complications (Diabetes Survival Skills Education) will improve Outcome: Progressing   Problem: Coping: Goal: Ability to adjust to condition or change in health will improve Outcome: Progressing   Problem: Fluid Volume: Goal: Ability to maintain a balanced intake and output will improve Outcome: Progressing   Problem: Health Behavior/Discharge Planning: Goal: Ability to identify and utilize available resources and services will improve Outcome: Progressing Goal: Ability to manage health-related needs will improve Outcome: Progressing   Problem: Metabolic: Goal: Ability to maintain appropriate glucose levels will improve Outcome: Progressing   Problem: Nutritional: Goal: Maintenance of adequate nutrition will improve Outcome: Progressing Goal: Progress toward achieving an optimal weight will improve Outcome: Progressing   Problem: Skin Integrity: Goal: Risk for impaired skin integrity will decrease Outcome: Progressing   Problem: Tissue Perfusion: Goal: Adequacy of tissue perfusion will improve Outcome: Progressing   Problem: Education: Goal: Knowledge of General Education information will improve Description: Including pain rating scale, medication(s)/side effects and non-pharmacologic comfort measures Outcome: Progressing   Problem: Health Behavior/Discharge Planning: Goal: Ability to manage health-related needs will improve Outcome: Progressing   Problem: Clinical Measurements: Goal: Ability to maintain clinical measurements within normal limits will improve Outcome: Progressing Goal: Will remain free from infection Outcome: Progressing Goal: Diagnostic test results will improve Outcome: Progressing Goal: Respiratory complications will improve Outcome: Progressing Goal: Cardiovascular complication will be avoided Outcome: Progressing   Problem: Activity: Goal:  Risk for activity intolerance will decrease Outcome: Progressing   Problem: Nutrition: Goal: Adequate nutrition will be maintained Outcome: Progressing   Problem: Coping: Goal: Level of anxiety will decrease Outcome: Progressing   Problem: Elimination: Goal: Will not experience complications related to bowel motility Outcome: Progressing Goal: Will not experience complications related to urinary retention Outcome: Progressing   Problem: Pain Management: Goal: General experience of comfort will improve Outcome: Progressing   Problem: Safety: Goal: Ability to remain free from injury will improve Outcome: Progressing   Problem: Skin Integrity: Goal: Risk for impaired skin integrity will decrease Outcome: Progressing   Problem: Education: Goal: Knowledge of disease or condition will improve Outcome: Progressing Goal: Knowledge of the prescribed therapeutic regimen will improve Outcome: Progressing Goal: Individualized Educational Video(s) Outcome: Progressing   Problem: Activity: Goal: Ability to tolerate increased activity will improve Outcome: Progressing Goal: Will verbalize the importance of balancing activity with adequate rest periods Outcome: Progressing   Problem: Respiratory: Goal: Ability to maintain a clear airway will improve Outcome: Progressing Goal: Levels of oxygenation will improve Outcome: Progressing Goal: Ability to maintain adequate ventilation will improve Outcome: Progressing   Problem: Activity: Goal: Ability to tolerate increased activity will improve Outcome: Progressing   Problem: Clinical Measurements: Goal: Ability to maintain a body temperature in the normal range will improve Outcome: Progressing   Problem: Respiratory: Goal: Ability to maintain adequate ventilation will improve Outcome: Progressing Goal: Ability to maintain a clear airway will improve Outcome: Progressing   Problem: Education: Goal: Knowledge of disease or  condition will improve Outcome: Progressing Goal: Knowledge of secondary prevention will improve (MUST DOCUMENT ALL) Outcome: Progressing Goal: Knowledge of patient specific risk factors will improve Loraine Leriche N/A or DELETE if not current risk factor) Outcome: Progressing   Problem: Ischemic Stroke/TIA Tissue Perfusion: Goal: Complications of ischemic stroke/TIA will be minimized Outcome: Progressing

## 2023-11-16 NOTE — Progress Notes (Signed)
 Occupational Therapy Treatment Patient Details Name: Michele House MRN: [id number] DOB: 10/21/1956 Today's Date: 11/16/2023   History of present illness Patient is a 68 year old with prolonged hospital stay, originally admitted with AMS. 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 She developed respiratory distress requiring mechanical ventilation. Self extubated 12/27 with re-intubation and extubation on 12/30. Pt also found with RLE DVT status post IVC filter placement given bleeding anticoagulation contraindicated. Seizure on 1/8 with transfer to 2nd floor.   OT comments  Chart reviewed, pt greeted in bed, agreeable to OT/PT co tx targeting improving functional activity tolerance in prep for ADL tasks. MAX A +2 required for bed mobility, static sitting with at least MAX A +1-2 for approx 15 minutes (improvement compared to previous tx sessions). MAX A for grooming tasks. Pt has poor overall awareness of current deficits but is making slow progress towards goals. Pt is left as received, all needs met. Discharge recommendation remains appropriate. OT will continue to follow      If plan is discharge home, recommend the following:  Assistance with cooking/housework;Assist for transportation;Help with stairs or ramp for entrance;Direct supervision/assist for medications management;Supervision due to cognitive status;Direct supervision/assist for financial management;Two people to help with walking and/or transfers;Two people to help with bathing/dressing/bathroom   Equipment Recommendations  Other (comment) (defer)    Recommendations for Other Services      Precautions / Restrictions Precautions Precautions: Fall Restrictions Weight Bearing Restrictions Per Provider Order: No       Mobility Bed Mobility Overal bed mobility: Needs Assistance Bed Mobility: Supine to Sit, Sit to Supine     Supine to sit: Max assist, +2 for physical assistance Sit to  supine: Max assist, +2 for physical assistance        Transfers                   General transfer comment: unsafe to attempt on this date     Balance Overall balance assessment: Needs assistance Sitting-balance support: Bilateral upper extremity supported, Feet supported Sitting balance-Leahy Scale: Zero Sitting balance - Comments: MAX A +1-2 for static sitting balance with posterior and R lean. Pt c/o dizziness, BP WNL.       Standing balance comment: unable/unsafe                           ADL either performed or assessed with clinical judgement   ADL Overall ADL's : Needs assistance/impaired     Grooming: Sitting;Wash/dry face;Maximal assistance;Total assistance;Brushing hair Grooming Details (indicate cue type and reason): sitting on edge of bed wtih MAX A for static sitting while OT brushed hair, applied leave in conditioner, NT notified             Lower Body Dressing: Bed level;Maximal assistance;Total assistance                      Extremity/Trunk Assessment              Vision       Perception     Praxis      Cognition Arousal: Alert Behavior During Therapy: Impulsive Overall Cognitive Status: No family/caregiver present to determine baseline cognitive functioning Area of Impairment: Attention, Memory, Following commands, Safety/judgement, Awareness                   Current Attention Level: Sustained Memory: Decreased short-term memory Following Commands:  Follows one step commands with increased time Safety/Judgement: Decreased awareness of deficits, Decreased awareness of safety Awareness: Intellectual Problem Solving: Slow processing, Decreased initiation, Difficulty sequencing, Requires verbal cues, Requires tactile cues          Exercises Other Exercises Other Exercises: noted intermittent use of BUE- purposeful ( to scratch face) but does not accurately attempt on command; will continue to  assess    Shoulder Instructions       General Comments BP 128/89 sitting on edge of bed after reports of dizziness    Pertinent Vitals/ Pain       Pain Assessment Pain Assessment: No/denies pain  Home Living                                          Prior Functioning/Environment              Frequency  Min 1X/week        Progress Toward Goals  OT Goals(current goals can now be found in the care plan section)  Progress towards OT goals: Progressing toward goals  Acute Rehab OT Goals Time For Goal Achievement: 11/26/23  Plan      Co-evaluation    PT/OT/SLP Co-Evaluation/Treatment: Yes Reason for Co-Treatment: Complexity of the patient's impairments (multi-system involvement);Necessary to address cognition/behavior during functional activity;For patient/therapist safety;To address functional/ADL transfers   OT goals addressed during session: ADL's and self-care      AM-PAC OT 6 Clicks Daily Activity     Outcome Measure   Help from another person eating meals?: A Lot Help from another person taking care of personal grooming?: A Lot Help from another person toileting, which includes using toliet, bedpan, or urinal?: Total Help from another person bathing (including washing, rinsing, drying)?: Total Help from another person to put on and taking off regular upper body clothing?: Total Help from another person to put on and taking off regular lower body clothing?: Total 6 Click Score: 8    End of Session    OT Visit Diagnosis: Other abnormalities of gait and mobility (R26.89);Muscle weakness (generalized) (M62.81);Hemiplegia and hemiparesis   Activity Tolerance Patient tolerated treatment well   Patient Left in bed;with call bell/phone within reach;with bed alarm set   Nurse Communication Other (comment) (NT re status)        Time: 8670-8647 OT Time Calculation (min): 23 min  Charges: OT General Charges $OT Visit: 1 Visit OT  Treatments $Therapeutic Activity: 8-22 mins  Therisa Sheffield, OTD OTR/L  11/16/23, 2:25 PM

## 2023-11-16 NOTE — Progress Notes (Addendum)
 Progress Note   Patient: Michele House FMW:[id number] DOB: 1955/11/10 DOA: 10/02/2023     45 DOS: the patient was seen and examined on 11/16/2023   Brief hospital course:  Michele House is a 68 y.o. female with medical history significant of COPD, chronic respiratory failure on 2 L, hypertension, type 2 diabetes, HFpEF, recent discharge from the hospital 24th November 2024 after hospitalization for COPD exacerbation complicated by respiratory failure.  She presented to the hospital again on 10/02/2023 with chest pain, malaise, cough, shortness of breath, abdominal pain and dysuria.   She was admitted to the hospital for COPD exacerbation, acute on chronic hypoxemic respiratory failure, UTI and pneumonia.  She later developed worsening confusions, MRI brain revealed multiple acute strokes.   Prolonged hospital admission complicated by several issues including hemorrhagic shock requiring transfer to ICU for pressors and GI consultation. EGD showed duodenal ulcer/s that were treated with clipped but later required embolization with IR.  Pt also found with RLE DVT status post IVC filter placement given bleeding anticoagulation contraindicated.  For detailed hospital course and significant events, see TR[facility] progress note by Dr. Jens of 11/09/23 and ICU notes from earlier admission.  Further hospital course and management as outlined below.    Assessment and Plan:  Acute metabolic encephalopathy: Improved. Completed course of high-dose IV Solu-Medrol  on 10/12/2023 per Neurology Differential diagnosis included autoimmune encephalitis --Delirium precuations --Avoid benzo's & sedating meds   Acute stroke with right hemiplegia: Off of Plavix  because of recent acute GI bleeding.   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  New Onset Seizure-like Activity - evening of 11/10/23 - see RN and cross coverage notes --Neurology following - follow up on recommendations --Continue Keppra  500 mg BID --EEG with moderate diffuse encephalopathy --Seizure precautions --PRN Ativan  if seizure activity   Acute GI bleeding, bleeding duodenal ulcer:  S/p EGD on 10/28/2023 with clip placed.   S/p IR embolization on 10/30/2023.   --Continue Protonix . --Monitor CBC --Transfuse if Hbg < 7 or less than 8 with active bleeding or hemodynamic instability   Lower extremity pain, more severe in the left, right peroneal DVT: Patient complaining of pain in the lower extremities 1/7.  Initially refused ultrasound of the left lower extremity because of severe pain.  Ultrasound of the right lower extremity showed right calf DVT.  LLE U/S obtained 1/8 after pt agreed - no LLE DVT was seen. Due to high risk of recurrent GI bleeding given recent massive GI bleed requiring embolization by IR, unable to treat with anticoagulation. Vascular surgery placed IVC filter on 11/09/23   Hemorrhagic shock: Resolved Hypotension: Resolved.  Off of vasopressors  Acute blood loss anemia: H&H is stable.  S/p transfusion with 2 units of PRBCs on 10/28/2023, 1 unit of PRBCs on 10/29/2023.  S/p transfusion with 2 units of platelet pheresis on 10/29/2023. H&H is stable.   Pneumonia: Initially completed 5 days of IV ceftriaxone  and azithromycin  on 10/07/2023. She was restarted on broad-spectrum antibiotics (vancomycin , ceftriaxone  and Flagyl ) on 10/28/2023 when she decompensated requiring transfer to the ICU. Acute UTI: Urine culture showed strep agalactiae.  Completed antibiotics.   Acute on chronic diastolic CHF: Stable.   S/p diuresis with IV Lasix .  Continue oral Lasix  20 mg daily Monitor volume status  COPD exacerbation: This had previously resolved, but required additional course of  steroids. Pulmonology following Completed 2nd  course w/ prednisone  taper  Now stable, monitor closely   Acute on chronic hypoxemic respiratory failure, chronic hypercapnic respiratory failure: She was intubated on 10/28/2023 and extubated on 11/01/2023. Requiring 2 L/min oxygen via nasal cannula. Continues to desat to low/mid 80's when tried on room air. --Supplement O2, target sats > 88% --Bipap nightly   Type II DM with severe hyperglycemia, hypoglycemia  CBG's remain elevated despite insulin  increased yetetrday --Appreciate diabetes coordinator input. --Increase Semglee  10 >> 12 units at bedtime --Increase Novolog  3 >> 4units TID WC, hold if < 50% meal consumed --Sliding scale Novolog  0-9 AC / 0-5 HS  Anxiety/panic attacks: improved.   Avoid benzo's due to waxing and waning mental status.   On Zoloft   Non-small cell carcinoma -- pt found to have 1.3 cm right upper lung spiculated nodule: S/p bronchoscopy on 10/20/2023.  Pathology showed rare atypical cells, but unclear if malignancy and findings more consistent with reactive bronchial cells with acute inflammation. Lung biopsy repeated 1/10, results today per Dr. Clista note. --Follow up with Oncology outpatient --Outpatient PET scan recommended  Hypothyroidism -- pt has been on her Synthroid  100 mcg daily.  Daughter requested 1/11 to have thyroid  labs repeated, citing concern for possible Hashimoto's Encephalopathy causing patient's persistent mental status changes.  Advised acute setting and recent illnesses often make TSH and T4 abnormal and not very useful clinically. TSH on 10/06/23 was normal at 3.002 >> now 49.663 Free T4 on 10/06/23 minimally elevated at 1.24 >> now low at 0.38 --Increased Synthroid  to 150 mcg daily --Repeat TSH and free T4 in 6-8 weeks with PCP --Negative anti-TPO Ab (obtained at daughter's request as she has hashimoto's)     General weakness: PT and OT recommend discharge to SNF.          Subjective: Pt sleeping comfortably, woke to voice,  denies complaints. NO acute events reported.  She asks when she can go home.   Physical Exam: Vitals:   11/15/23 2323 11/16/23 0414 11/16/23 0515 11/16/23 1204  BP: 131/72 122/68  129/77  Pulse: 89 86    Resp: 16 14  15   Temp: 98.6 F (37 C) 98.4 F (36.9 C)    TempSrc:  Oral    SpO2: 99% 94%    Weight:   69.8 kg   Height:       General exam: awake, alert, no acute distress, chronically ill appearaing HEENT: moist mucus membranes, hearing grossly normal  Respiratory system: CTAB no wheezes or rhonchi, diminished bases due to poor inspiratory volume, normal respiratory effort. Cardiovascular system: normal S1/S2, RRR, no peripheral edema Gastrointestinal system: soft, NT, ND, no HSM felt, +bowel sounds. Central nervous system: A&O x 2+. no gross focal neurologic deficits, normal speech Skin: dry, intact, normal temperature, no rashes seen on visualized skin Psychiatry: normal mood, congruent affect   Data Reviewed: No new labs today.  1/13  Normal BMP except glucose 203 and Ca 8.7  1/13  CBC with Hbg 9.1 stable  CBG's 192 >> 295 >> 288 >> 200 >> 254 >> 246  1/12  TSH 49.663 Free T4 0.38   EEG -- no epileptic activity seen, showed moderate diffuse encephalopathy  U/S doppler LLE - negative for DVT  Pending --- repeat lung biopsy path (1/10)     Family Communication: daughter updated by phone afternoon 1/9, 1/11.  Will attempt to call as time allows.   Disposition: Status is: Inpatient  Remains inpatient appropriate because: Needs SNF  placement, pending.  Medically stable.    Planned Discharge Destination: Skilled nursing facility    Time spent: 35 minutes  Author: Burnard DELENA Cunning, DO 11/16/2023 2:54 PM  For on call review [email or web address].

## 2023-11-16 NOTE — TOC Progression Note (Signed)
 Transition of Care [facility]) - Progression Note    Patient Details  Name: Michele House MRN: [id number] Date of Birth: 09/05/1956  Transition of Care [facility] CM/SW Contact  Tomasa JAYSON Childes, RN Phone Number: 11/16/2023, 3:44 PM  Clinical Narrative:    TOC continuing to follow patient's progress throughout discharge planning.   Expected Discharge Plan: Skilled Nursing Facility Barriers to Discharge: Continued Medical Work up  Expected Discharge Plan and Services         Expected Discharge Date: 11/09/23                                     Social Determinants of Health (SDOH) Interventions SDOH Screenings   Food Insecurity: Patient Unable To Answer (10/03/2023)  Recent Concern: Food Insecurity - Food Insecurity Present (09/24/2023)  Housing: High Risk (10/03/2023)  Transportation Needs: Patient Unable To Answer (10/03/2023)  Recent Concern: Transportation Needs - Unmet Transportation Needs (09/24/2023)  Utilities: Patient Unable To Answer (10/03/2023)  Social Connections: Patient Declined (11/02/2023)  Tobacco Use: Medium Risk (11/12/2023)    Readmission Risk Interventions     No data to display

## 2023-11-16 NOTE — Progress Notes (Signed)
 Medical oncology note-   Repeat right upper lobe lung biopsy reviewed.  Showed non-small cell carcinoma.  Coexpress TTF-1 and p40.    Patient will need PET scan to complete the staging and to finalize the treatment plan which can be done as outpatient.  Will arrange for follow-up visit at the cancer center on discharge.

## 2023-11-16 NOTE — Progress Notes (Signed)
 NAME:  Michele House, MRN:  [id number], DOB:  [birth year], LOS: 45 ADMISSION DATE:  10/02/2023   History of Present Illness:  Case of a 68 year old female patient with a past medical history of COPD with chronic respiratory failure on 2 L nasal cannula, hypertension, type 2 diabetes, heart failure with preserved EF who has been here for a month.  Initially presented with altered mental status encephalopathy.  Had an extensive workup including brain MRI which was negative for any acute intracranial abnormality initially.  However unfortunately course complicated by acute CVA with repeat MRI showing multiple acute infarcts in the high bilateral posterior frontal and parietal lobes.  She recovered from that but course further complicated by waxing and waning mental status which prompted an LP to rule out encephalitis and has been unremarkable so far.  She was found to have right upper lobe lung nodule which was concerning for malignancy and underwent robotic assisted navigational bronchoscopy with biopsy on 12/27 which showed very rare atypical cells and reactive alveolar cells mostly.    Pertinent  Medical History  As abnove  Significant Hospital Events: Including procedures, antibiotic start and stop dates in addition to other pertinent events   12/01: Vital stable, respiratory viral panel negative, procalcitonin negative, preliminary blood cultures negative.  Urine cultures are ordered as add-on, ammonia levels mildly elevated at 39, strep pneumo negative, CBG elevated at 244, history of enlarging pulmonary nodule with recommendations for PET/CT during most recent admission which has not been done yet, if video bronchoscopy was scheduled for 10/13/2023 as outpatient. 12/02: Unable to take care of herself and son cannot provide the required care as she required 24-hour supervision due to worsening dementia.  PT is recommending SNF. 12/03: Blood pressure started trending up, restarting home  Zestoretic .  Urine cultures with strep agalactiae, penicillin allergy noted, will continue with ceftriaxone  for now.  Also started on Remeron  for concern of worsening depression and unable to sleep at night  12/05:  Patient has some blurry vision and weakness and pain.  MRI of the brain negative for acute intracranial abnormality. 12/08:  Patient more confused today than yesterday.  Able to follow some simple commands but not as good of a conversation today as yesterday. 12/09:  Patient still confused today.  Does not follow simple commands today. 12/10: Patient more talkative today but was looking up at the ceiling when she was talking with me.  She does not move her extremities to command but does move them on her own.  Physical therapy and Occupational Therapy did not see her move her right arm. 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  Mild associated petechial hemorrhage.  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 12/12: Patient with new stroke, CTA head and neck with no large vessel occlusion.  Patient had a recent echocardiogram which was normal. Concern of paraneoplastic versus hypercoagulability secondary to malignancy.  Patient missed her appointment for bronchoscopy and outpatient appointment for PET scan due to recurrent hospitalizations. 12/13: Patient continued to have waxing and waning mental status, unable to move right upper and lower extremities.  Having some hallucination.  Pulmonary is planning lung biopsy on 12/27 if she remains stable.  Rapidly progressive dementia and now with underlying stroke, question of paraneoplastic encephalitis and hypercoagulability secondary to underlying undiagnosed malignancy. 12/15: Patient with much  improved mentation.  Having some flickering of movements on right  upper and lower extremity today, left extremities weaker but seems improving.   12/18: Pt underwent bronchoscopy and lung biopsy 12/19: patient stable , she is being optimized for dc for rehab.  She's cleared from pulmonary for discharge. Results from bronch will take 1 week.   12/26: Pt transferred to the stepdown unit with acute hypercapnic respiratory requiring Bipap but subsequently required mechanical intubation due to worsening respiratory failure.  Insulin  gtt initiated due to severe hyperglycemia.  With hemorrhagic shock due to bleeding duodenal ulcer, underwent emergent EGD with successful hemostasis by injection of Epinephrine  and hemostatic clip. 12/27: Hgb slowly trending down, no report of bleeding from OG suction nor melena. Weaning down vasopressors, if able to continue to wean vasopressors, then can consider WUA/SBT.  Giving temporizing measures for Hyperkalemia. Self extubated but required reintubation. Hgb continued to trend down, given 1 unit pRBC's and 2 FFP.  IR consulted for consideration for embolization. 12/28: Weaned off Vasopressin , Levophed  weaning down.  S/p IR embolization of the Duodenal artery. Peak pressures and lung sounds improved with Ketamine , will keep intubated today given rapid reintubation yesterday. 12/30- patient on PRVC FiO2 28%, weaning off ketamine  and hoping to perform SBT today.  Holding feeds until post SBT.  11/02/23- patient is stable overnight no acute events, electrllytes improved.  No GI bleeds noted.  Opitimizing for TRH today.  11/03/23 patient is more interactive this am, still has not been able to get OOB.  Will continue to follow her from pulmonary with COPD and lung cancer. Her pathology was sent for second opinion due to mixed reporting from cytotech and pathology report.  11/03/22- patient is good spirits this am. Son came to visit yesterday and we reivewed medical plan. H/h is stable. Following peripherally from pulmonary perspective for COPD and  lung mass.  11/05/23- patient on 3L/min.  She still has 9 day old Right internal jugular central line.  I have placed orders for RN to remove this line today.  She required BIPAP yesterday but states she did not feel SOB>  11/06/23- patient shares she feels well today.  She is on 3L/min but takes it off at times. She reports breathing feels improved 11/07/23- patient with no acute events overnight.  H/h is improved 11/08/23- patient seen at bedside.  Sitting up in bed in no distress denies dyspnea.  She is on nasal canula 2L/min.  She asks if we can help her get glasses and dentures.  I received inconclusive results from lung biopsy. I discussed this with patient.  We discussed repeating the biopsy and patient states she wants this done as soon as possible. She is clinically stable.  Diagnosis Lung, biopsy, Right upper lobe - VERY RARE ATYPICAL CELLS, FAVOR REACTIVE. - FRAGMENTS OF ALVEOLATED LUNG AND BLOOD CLOT. Diagnosis Note Per [facility] recent imaging revealed an enlarging spiculated 1.6 cm right upper lung lesion. This case underwent intradepartmental consultation and Dr. Reed concurs with the interpretation. See concurrent case [id number]. Michele House  11/09/23- patient with acute dvt.  Not good candidate for Antcoagulation per GI due to upper GI bleed with duod ulcer 11/10/23- plant for lung biopsy on 11/12/23- reviewed with patient.  NPO Thursday night at midnight  11/11/23- I evaluated patient at bedside, she reports no acute events.  We discussed lung biopsy she is agreeable and wishes to proceed.  She asked if I can help her get glasses and new dentures and I did discuss this with  her son 08/11/24- patient evaluated prior to procedure , on room air saturating 92%. No questions regarding about procedure 11/13/23-patient is on 2L/min [place].  She appears to be in good spirits asking to be discharged.  She does report partial weakness on left side post CVA but on examination from me does not seem to have FND.   She has PT/OT ordered.  She can likely be weaned from oxygen prior to dc home. Her pathology results from lung biopsy are in process.  11/15/23- patient seen at bedside , stable on 3L.  She is being optimized for dc to SNF 11/16/23- patient in bed reports feeling abd pain and dizziness.  She is now on 1.5L/min [place].  Her biopsy results came back as NSCLC and we have contacted oncology.  Ive met with son and reviewed findings.   Objective   Blood pressure 129/77, pulse 86, temperature 98.4 F (36.9 C), temperature source Oral, resp. rate 15, height 5' (1.524 m), weight 69.8 kg, last menstrual period 12/24/1998, SpO2 94%.       No intake or output data in the 24 hours ending 11/16/23 1640  Filed Weights   11/14/23 0547 11/15/23 0400 11/16/23 0515  Weight: 66.8 kg 65.9 kg 69.8 kg    Examination: General: Awake, follows verbal communication HENT: Supple neck reactive pupils  Lungs: mild wheezing b/l Cardiovascular: Normal S1, Normal S2, RRR  Abdomen: Soft, non tender, non distended, +BS  Extremities: Warm and well perfused no edema.   Labs and imaging were reviewed.   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  This has been sent for second opinion to be reviewed.  I think its most likely primary lung cancer.  S/p lung biopsy- with NSCLS - reported to Dr Clista oncology   Best Practice (right click and Reselect all SmartList Selections daily)   Diet/type: NPO DVT  prophylaxis SCD Pressure ulcer(s): N/A GI prophylaxis: PPI Lines: Central line Foley:  Yes, and it is still needed Code Status:  full code     Ladislao Cohenour, M.D.  Pulmonary & Critical Care Medicine

## 2023-11-17 DIAGNOSIS — G9341 Metabolic encephalopathy: Secondary | ICD-10-CM | POA: Diagnosis not present

## 2023-11-17 LAB — GLUCOSE, CAPILLARY
Glucose-Capillary: 205 mg/dL — ABNORMAL HIGH (ref 70–99)
Glucose-Capillary: 163 mg/dL — ABNORMAL HIGH (ref 70–99)
Glucose-Capillary: 295 mg/dL — ABNORMAL HIGH (ref 70–99)
Glucose-Capillary: 274 mg/dL — ABNORMAL HIGH (ref 70–99)

## 2023-11-17 NOTE — Progress Notes (Signed)
 Progress Note   Patient: Michele House QMV:[id number] DOB: [birth year] DOA: 10/02/2023     46 DOS: the patient was seen and examined on 11/17/2023   Brief hospital course: "Michele House is a 68 y.o. female with medical history significant of COPD, chronic respiratory failure on 2 L, hypertension, type 2 diabetes, HFpEF, recent discharge from the hospital 24th November 2024 after hospitalization for COPD exacerbation complicated by respiratory failure.  She presented to the hospital again on 10/02/2023 with chest pain, malaise, cough, shortness of breath, abdominal pain and dysuria.   She was admitted to the hospital for COPD exacerbation, acute on chronic hypoxemic respiratory failure, UTI and pneumonia.  She later developed worsening confusions, MRI brain revealed multiple acute strokes.   Prolonged hospital admission complicated by several issues including hemorrhagic shock requiring transfer to ICU for pressors and GI consultation. EGD showed duodenal ulcer/s that were treated with clipped but later required embolization with IR.  Pt also found with RLE DVT status post IVC filter placement given bleeding anticoagulation contraindicated.   For detailed hospital course and significant events, see [facility] progress note by Dr. Leory Rands of 11/09/23 and ICU notes from earlier admission.   Further hospital course and management as outlined below.   Assessment and Plan:   Acute metabolic encephalopathy: Improved. Completed course of high-dose IV Solu-Medrol  on 10/12/2023 per Neurology Differential diagnosis included autoimmune encephalitis --Delirium precuations --Avoid benzo's & sedating meds   Acute stroke with right hemiplegia: Off of Plavix  because of recent acute GI bleeding.   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   New Onset Seizure-like Activity - evening of 11/10/23 - see RN and cross coverage notes --Neurology following - follow up on recommendations --Continue Keppra  500 mg BID --EEG with moderate diffuse encephalopathy --Seizure precautions --PRN Ativan  if seizure activity   Acute GI bleeding, bleeding duodenal ulcer:  S/p EGD on 10/28/2023 with clip placed.   S/p IR embolization on 10/30/2023.   --Continue Protonix . --Monitor CBC --Transfuse if Hbg < 7 or less than 8 with active bleeding or hemodynamic instability   Lower extremity pain, more severe in the left, right peroneal DVT: Patient complaining of pain in the lower extremities 1/7.  Initially refused ultrasound of the left lower extremity because of severe pain.  Ultrasound of the right lower extremity showed right calf DVT.  LLE U/S obtained 1/8 after pt agreed - no LLE DVT was seen. Due to high risk of recurrent GI bleeding given recent massive GI bleed requiring embolization by IR, unable to treat with anticoagulation. Vascular surgery placed IVC filter on 11/09/23   Hemorrhagic shock: Resolved Hypotension: Resolved.  Off of vasopressors   Acute blood loss anemia: H&H is stable.  S/p transfusion with 2 units of PRBCs on 10/28/2023, 1 unit of PRBCs on 10/29/2023.  S/p transfusion with 2 units of platelet pheresis on 10/29/2023. H&H is stable.   Pneumonia: Initially completed 5 days of IV ceftriaxone  and azithromycin  on 10/07/2023. She was restarted on broad-spectrum antibiotics (vancomycin , ceftriaxone  and Flagyl ) on 10/28/2023 when she decompensated requiring transfer to the ICU. Acute UTI: Urine culture showed strep agalactiae.  Completed antibiotics.   Acute on chronic diastolic CHF: Stable.   S/p diuresis with IV Lasix .  Continue oral Lasix  20 mg daily Monitor volume status   COPD exacerbation: This had previously resolved, but  required additional course of steroids. Pulmonology following Completed  2nd course w/ prednisone  taper  Now stable, monitor closely   Acute on chronic hypoxemic respiratory failure, chronic hypercapnic respiratory failure: She was intubated on 10/28/2023 and extubated on 11/01/2023. Currently she is requiring 2 L/min oxygen via nasal cannula. Continues to desat to low/mid 80's when tried on room air. --Supplement O2, target sats > 88% --Bipap nightly   Type II DM with severe hyperglycemia, hypoglycemia  CBG's remain elevated despite insulin  increased yetetrday --Appreciate diabetes coordinator input. --Continue Semglee  12 units at bedtime --Continue Novolog  4units TID WC, hold if < 50% meal consumed --Sliding scale Novolog  0-9 AC / 0-5 HS   Anxiety/panic attacks: improved.   Avoid benzo's due to waxing and waning mental status.   On Zoloft    Non-small cell carcinoma -- pt found to have 1.3 cm right upper lung spiculated nodule: S/p bronchoscopy on 10/20/2023.  Pathology showed rare atypical cells, but unclear if malignancy and findings more consistent with reactive bronchial cells with acute inflammation. Lung biopsy repeated 1/10, results today per Dr. Aris Bel note. --Follow up with Oncology outpatient --Outpatient PET scan recommended   Hypothyroidism -- pt has been on her Synthroid  100 mcg daily.  Daughter requested 1/11 to have thyroid  labs repeated, citing concern for possible Hashimoto's Encephalopathy causing patient's persistent mental status changes.  Advised acute setting and recent illnesses often make TSH and T4 abnormal and not very useful clinically. TSH on 10/06/23 was normal at 3.002 >> now 49.663 Free T4 on 10/06/23 minimally elevated at 1.24 >> now low at 0.38 --Continue Synthroid  150 mcg daily --Repeat TSH and free T4 in 6-8 weeks with PCP --Negative anti-TPO Ab (obtained at daughter's request as she has hashimoto's)     General weakness: PT and OT recommend discharge to SNF.         Subjective: Pt denies any new symptoms. Mild  shortness of breath Denies fever, chest pain, headache, dizziness, nausea/vomiting, constipation  Physical Exam: Vitals:   11/17/23 0312 11/17/23 0600 11/17/23 0818 11/17/23 1128  BP: 108/73  131/72 132/78  Pulse: (!) 101  95 90  Resp: 17  18 18   Temp: 97.7 F (36.5 C)  98.3 F (36.8 C) 97.9 F (36.6 C)  TempSrc:   Oral   SpO2: 98%  99% 99%  Weight:  68.6 kg    Height:       Physical Exam Constitutional:      General: She is not in acute distress.    Appearance: She is ill-appearing.  HENT:     Head: Normocephalic and atraumatic.     Nose: Nose normal. No congestion.     Mouth/Throat:     Mouth: Mucous membranes are moist.     Pharynx: Oropharynx is clear. No oropharyngeal exudate.  Eyes:     Extraocular Movements: Extraocular movements intact.     Conjunctiva/sclera: Conjunctivae normal.  Cardiovascular:     Rate and Rhythm: Normal rate and regular rhythm.  Pulmonary:     Effort: Pulmonary effort is normal.     Breath sounds: Normal breath sounds.  Abdominal:     General: Abdomen is flat. Bowel sounds are normal.     Palpations: Abdomen is soft.  Musculoskeletal:        General: No swelling or tenderness. Normal range of motion.     Cervical back: Normal range of motion.  Skin:    General: Skin is warm.     Capillary Refill: Capillary refill takes 2 to  3 seconds.     Findings: No rash.  Neurological:     Mental Status: Mental status is at baseline.     Motor: No weakness.  Psychiatric:        Mood and Affect: Mood normal.     Data Reviewed:  Latest Reference Range & Units 11/15/23 09:12  Sodium 135 - 145 mmol/L 139  Potassium 3.5 - 5.1 mmol/L 3.8  Chloride 98 - 111 mmol/L 98  CO2 22 - 32 mmol/L 32  Glucose 70 - 99 mg/dL 191 (H)  BUN 8 - 23 mg/dL 10  Creatinine 4.78 - 2.95 mg/dL 6.21  Calcium  8.9 - 10.3 mg/dL 8.7 (L)  Anion gap 5 - 15  9  GFR, Estimated >60 mL/min >60  WBC 4.0 - 10.5 K/uL 4.3  RBC 3.87 - 5.11 MIL/uL 3.07 (L)  Hemoglobin 12.0 - 15.0  g/dL 9.1 (L)  HCT 30.8 - 65.7 % 27.7 (L)  MCV 80.0 - 100.0 fL 90.2  MCH 26.0 - 34.0 pg 29.6  MCHC 30.0 - 36.0 g/dL 84.6  RDW 96.2 - 95.2 % 15.9 (H)  Platelets 150 - 400 K/uL 204  nRBC 0.0 - 0.2 % 0.0  (H): Data is abnormally high (L): Data is abnormally low  Family Communication:   Disposition: Status is: Inpatient   Planned Discharge Destination: Skilled nursing facility    Time spent: 35 minutes  Author: Suzan Erm, MD 11/17/2023 2:27 PM  For on call review [email or web address].

## 2023-11-17 NOTE — Plan of Care (Signed)
  Problem: Education: Goal: Ability to describe self-care measures that may prevent or decrease complications (Diabetes Survival Skills Education) will improve Outcome: Progressing   Problem: Coping: Goal: Ability to adjust to condition or change in health will improve Outcome: Progressing   Problem: Fluid Volume: Goal: Ability to maintain a balanced intake and output will improve Outcome: Progressing   Problem: Health Behavior/Discharge Planning: Goal: Ability to identify and utilize available resources and services will improve Outcome: Progressing Goal: Ability to manage health-related needs will improve Outcome: Progressing   Problem: Metabolic: Goal: Ability to maintain appropriate glucose levels will improve Outcome: Progressing   Problem: Nutritional: Goal: Maintenance of adequate nutrition will improve Outcome: Progressing Goal: Progress toward achieving an optimal weight will improve Outcome: Progressing   Problem: Skin Integrity: Goal: Risk for impaired skin integrity will decrease Outcome: Progressing   Problem: Tissue Perfusion: Goal: Adequacy of tissue perfusion will improve Outcome: Progressing   Problem: Education: Goal: Knowledge of General Education information will improve Description: Including pain rating scale, medication(s)/side effects and non-pharmacologic comfort measures Outcome: Progressing   Problem: Health Behavior/Discharge Planning: Goal: Ability to manage health-related needs will improve Outcome: Progressing   Problem: Clinical Measurements: Goal: Ability to maintain clinical measurements within normal limits will improve Outcome: Progressing Goal: Will remain free from infection Outcome: Progressing Goal: Diagnostic test results will improve Outcome: Progressing Goal: Respiratory complications will improve Outcome: Progressing Goal: Cardiovascular complication will be avoided Outcome: Progressing   Problem: Activity: Goal:  Risk for activity intolerance will decrease Outcome: Progressing   Problem: Nutrition: Goal: Adequate nutrition will be maintained Outcome: Progressing   Problem: Coping: Goal: Level of anxiety will decrease Outcome: Progressing   Problem: Elimination: Goal: Will not experience complications related to bowel motility Outcome: Progressing Goal: Will not experience complications related to urinary retention Outcome: Progressing   Problem: Pain Management: Goal: General experience of comfort will improve Outcome: Progressing   Problem: Safety: Goal: Ability to remain free from injury will improve Outcome: Progressing   Problem: Skin Integrity: Goal: Risk for impaired skin integrity will decrease Outcome: Progressing   Problem: Education: Goal: Knowledge of disease or condition will improve Outcome: Progressing Goal: Knowledge of the prescribed therapeutic regimen will improve Outcome: Progressing Goal: Individualized Educational Video(s) Outcome: Progressing   Problem: Activity: Goal: Ability to tolerate increased activity will improve Outcome: Progressing Goal: Will verbalize the importance of balancing activity with adequate rest periods Outcome: Progressing   Problem: Respiratory: Goal: Ability to maintain a clear airway will improve Outcome: Progressing Goal: Levels of oxygenation will improve Outcome: Progressing Goal: Ability to maintain adequate ventilation will improve Outcome: Progressing   Problem: Activity: Goal: Ability to tolerate increased activity will improve Outcome: Progressing   Problem: Clinical Measurements: Goal: Ability to maintain a body temperature in the normal range will improve Outcome: Progressing   Problem: Respiratory: Goal: Ability to maintain adequate ventilation will improve Outcome: Progressing Goal: Ability to maintain a clear airway will improve Outcome: Progressing   Problem: Education: Goal: Knowledge of disease or  condition will improve Outcome: Progressing Goal: Knowledge of secondary prevention will improve (MUST DOCUMENT ALL) Outcome: Progressing Goal: Knowledge of patient specific risk factors will improve Loraine Leriche N/A or DELETE if not current risk factor) Outcome: Progressing   Problem: Ischemic Stroke/TIA Tissue Perfusion: Goal: Complications of ischemic stroke/TIA will be minimized Outcome: Progressing

## 2023-11-17 NOTE — Progress Notes (Signed)
 NAME:  Michele House, MRN:  [id number], DOB:  Mar 07, 1956, LOS: 46 ADMISSION DATE:  10/02/2023   History of Present Illness:  Case of a 68 year old female patient with a past medical history of COPD with chronic respiratory failure on 2 L nasal cannula, hypertension, type 2 diabetes, heart failure with preserved EF who has been here for a month.  Initially presented with altered mental status encephalopathy.  Had an extensive workup including brain MRI which was negative for any acute intracranial abnormality initially.  However unfortunately course complicated by acute CVA with repeat MRI showing multiple acute infarcts in the high bilateral posterior frontal and parietal lobes.  She recovered from that but course further complicated by waxing and waning mental status which prompted an LP to rule out encephalitis and has been unremarkable so far.  She was found to have right upper lobe lung nodule which was concerning for malignancy and underwent robotic assisted navigational bronchoscopy with biopsy on 12/27 which showed very rare atypical cells and reactive alveolar cells mostly.    Pertinent  Medical History  As abnove  Significant Hospital Events: Including procedures, antibiotic start and stop dates in addition to other pertinent events   12/01: Vital stable, respiratory viral panel negative, procalcitonin negative, preliminary blood cultures negative.  Urine cultures are ordered as add-on, ammonia levels mildly elevated at 39, strep pneumo negative, CBG elevated at 244, history of enlarging pulmonary nodule with recommendations for PET/CT during most recent admission which has not been done yet, if video bronchoscopy was scheduled for 10/13/2023 as outpatient. 12/02: Unable to take care of herself and son cannot provide the required care as she required 24-hour supervision due to worsening dementia.  PT is recommending SNF. 12/03: Blood pressure started trending up, restarting home  Zestoretic .  Urine cultures with strep agalactiae, penicillin allergy noted, will continue with ceftriaxone  for now.  Also started on Remeron  for concern of worsening depression and unable to sleep at night  12/05:  Patient has some blurry vision and weakness and pain.  MRI of the brain negative for acute intracranial abnormality. 12/08:  Patient more confused today than yesterday.  Able to follow some simple commands but not as good of a conversation today as yesterday. 12/09:  Patient still confused today.  Does not follow simple commands today. 12/10: Patient more talkative today but was looking up at the ceiling when she was talking with me.  She does not move her extremities to command but does move them on her own.  Physical therapy and Occupational Therapy did not see her move her right arm. 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  Mild associated petechial hemorrhage.  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 12/12: Patient with new stroke, CTA head and neck with no large vessel occlusion.  Patient had a recent echocardiogram which was normal. Concern of paraneoplastic versus hypercoagulability secondary to malignancy.  Patient missed her appointment for bronchoscopy and outpatient appointment for PET scan due to recurrent hospitalizations. 12/13: Patient continued to have waxing and waning mental status, unable to move right upper and lower extremities.  Having some hallucination.  Pulmonary is planning lung biopsy on 12/27 if she remains stable.  Rapidly progressive dementia and now with underlying stroke, question of paraneoplastic encephalitis and hypercoagulability secondary to underlying undiagnosed malignancy. 12/15: Patient with much  improved mentation.  Having some flickering of movements on right  upper and lower extremity today, left extremities weaker but seems improving.   12/18: Pt underwent bronchoscopy and lung biopsy 12/19: patient stable , she is being optimized for dc for rehab.  She's cleared from pulmonary for discharge. Results from bronch will take 1 week.   12/26: Pt transferred to the stepdown unit with acute hypercapnic respiratory requiring Bipap but subsequently required mechanical intubation due to worsening respiratory failure.  Insulin  gtt initiated due to severe hyperglycemia.  With hemorrhagic shock due to bleeding duodenal ulcer, underwent emergent EGD with successful hemostasis by injection of Epinephrine  and hemostatic clip. 12/27: Hgb slowly trending down, no report of bleeding from OG suction nor melena. Weaning down vasopressors, if able to continue to wean vasopressors, then can consider WUA/SBT.  Giving temporizing measures for Hyperkalemia. Self extubated but required reintubation. Hgb continued to trend down, given 1 unit pRBC's and 2 FFP.  IR consulted for consideration for embolization. 12/28: Weaned off Vasopressin , Levophed  weaning down.  S/p IR embolization of the Duodenal artery. Peak pressures and lung sounds improved with Ketamine , will keep intubated today given rapid reintubation yesterday. 12/30- patient on PRVC FiO2 28%, weaning off ketamine  and hoping to perform SBT today.  Holding feeds until post SBT.  11/02/23- patient is stable overnight no acute events, electrllytes improved.  No GI bleeds noted.  Opitimizing for TRH today.  11/03/23 patient is more interactive this am, still has not been able to get OOB.  Will continue to follow her from pulmonary with COPD and lung cancer. Her pathology was sent for second opinion due to mixed reporting from cytotech and pathology report.  11/03/22- patient is good spirits this am. Son came to visit yesterday and we reivewed medical plan. H/h is stable. Following peripherally from pulmonary perspective for COPD and  lung mass.  11/05/23- patient on 3L/min.  She still has 9 day old Right internal jugular central line.  I have placed orders for RN to remove this line today.  She required BIPAP yesterday but states she did not feel SOB>  11/06/23- patient shares she feels well today.  She is on 3L/min but takes it off at times. She reports breathing feels improved 11/07/23- patient with no acute events overnight.  H/h is improved 11/08/23- patient seen at bedside.  Sitting up in bed in no distress denies dyspnea.  She is on nasal canula 2L/min.  She asks if we can help her get glasses and dentures.  I received inconclusive results from lung biopsy. I discussed this with patient.  We discussed repeating the biopsy and patient states she wants this done as soon as possible. She is clinically stable.  Diagnosis Lung, biopsy, Right upper lobe - VERY RARE ATYPICAL CELLS, FAVOR REACTIVE. - FRAGMENTS OF ALVEOLATED LUNG AND BLOOD CLOT. Diagnosis Note Per [facility] recent imaging revealed an enlarging spiculated 1.6 cm right upper lung lesion. This case underwent intradepartmental consultation and Dr. Kavin Parsley concurs with the interpretation. See concurrent case [id number]. [place] Ida  11/09/23- patient with acute dvt.  Not good candidate for Antcoagulation per GI due to upper GI bleed with duod ulcer 11/10/23- plant for lung biopsy on 11/12/23- reviewed with patient.  NPO Thursday night at midnight  11/11/23- I evaluated patient at bedside, she reports no acute events.  We discussed lung biopsy she is agreeable and wishes to proceed.  She asked if I can help her get glasses and new dentures and I did discuss this with  her son 08/11/24- patient evaluated prior to procedure , on room air saturating 92%. No questions regarding about procedure 11/13/23-patient is on 2L/min [place].  She appears to be in good spirits asking to be discharged.  She does report partial weakness on left side post CVA but on examination from me does not seem to have FND.   She has PT/OT ordered.  She can likely be weaned from oxygen prior to dc home. Her pathology results from lung biopsy are in process.  11/15/23- patient seen at bedside , stable on 3L.  She is being optimized for dc to SNF 11/16/23- patient in bed reports feeling abd pain and dizziness.  She is now on 1.5L/min [place].  Her biopsy results came back as NSCLC and we have contacted oncology.  Ive met with son and reviewed findings.  11/17/23- patient sitting up in bed in NAD.  She is eating lung during my evaluation.  She reports fatigue and is working with PT/OT to be discharged to SNF.   Objective   Blood pressure 132/78, pulse 90, temperature 97.9 F (36.6 C), resp. rate 18, height 5' (1.524 m), weight 68.6 kg, last menstrual period 12/24/1998, SpO2 99%.        Intake/Output Summary (Last 24 hours) at 11/17/2023 1217 Last data filed at 11/16/2023 1659 Gross per 24 hour  Intake --  Output 850 ml  Net -850 ml    Filed Weights   11/15/23 0400 11/16/23 0515 11/17/23 0600  Weight: 65.9 kg 69.8 kg 68.6 kg    Examination: General: Awake, follows verbal communication HENT: Supple neck reactive pupils  Lungs: mild wheezing b/l Cardiovascular: Normal S1, Normal S2, RRR  Abdomen: Soft, non tender, non distended, +BS  Extremities: Warm and well perfused no edema.   Labs and imaging were reviewed.   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  This has been sent for second opinion to  be reviewed.  I think its most likely primary lung cancer.  S/p lung biopsy- with NSCLS - reported to Dr Aris Bel oncology   Best Practice (right click and "Reselect all SmartList Selections" daily)   Diet/type: NPO DVT prophylaxis SCD Pressure ulcer(s): N/A GI prophylaxis: PPI Lines: Central line Foley:  Yes, and it is still needed Code Status:  full code     Yadhira Mckneely, M.D.  Pulmonary & Critical Care Medicine

## 2023-11-17 NOTE — Plan of Care (Signed)
 Problem: Education: Goal: Ability to describe self-care measures that may prevent or decrease complications (Diabetes Survival Skills Education) will improve Outcome: Progressing   Problem: Coping: Goal: Ability to adjust to condition or change in health will improve Outcome: Progressing   Problem: Fluid Volume: Goal: Ability to maintain a balanced intake and output will improve Outcome: Progressing   Problem: Health Behavior/Discharge Planning: Goal: Ability to identify and utilize available resources and services will improve Outcome: Progressing Goal: Ability to manage health-related needs will improve Outcome: Progressing   Problem: Metabolic: Goal: Ability to maintain appropriate glucose levels will improve Outcome: Progressing   Problem: Nutritional: Goal: Maintenance of adequate nutrition will improve Outcome: Progressing Goal: Progress toward achieving an optimal weight will improve Outcome: Progressing   Problem: Skin Integrity: Goal: Risk for impaired skin integrity will decrease Outcome: Progressing   Problem: Tissue Perfusion: Goal: Adequacy of tissue perfusion will improve Outcome: Progressing   Problem: Education: Goal: Knowledge of General Education information will improve Description: Including pain rating scale, medication(s)/side effects and non-pharmacologic comfort measures Outcome: Progressing   Problem: Health Behavior/Discharge Planning: Goal: Ability to manage health-related needs will improve Outcome: Progressing   Problem: Clinical Measurements: Goal: Ability to maintain clinical measurements within normal limits will improve Outcome: Progressing Goal: Will remain free from infection Outcome: Progressing Goal: Diagnostic test results will improve Outcome: Progressing Goal: Respiratory complications will improve Outcome: Progressing Goal: Cardiovascular complication will be avoided Outcome: Progressing   Problem: Activity: Goal:  Risk for activity intolerance will decrease Outcome: Progressing   Problem: Nutrition: Goal: Adequate nutrition will be maintained Outcome: Progressing   Problem: Coping: Goal: Level of anxiety will decrease Outcome: Progressing   Problem: Elimination: Goal: Will not experience complications related to bowel motility Outcome: Progressing Goal: Will not experience complications related to urinary retention Outcome: Progressing   Problem: Pain Management: Goal: General experience of comfort will improve Outcome: Progressing   Problem: Safety: Goal: Ability to remain free from injury will improve Outcome: Progressing   Problem: Skin Integrity: Goal: Risk for impaired skin integrity will decrease Outcome: Progressing   Problem: Education: Goal: Knowledge of disease or condition will improve Outcome: Progressing Goal: Knowledge of the prescribed therapeutic regimen will improve Outcome: Progressing Goal: Individualized Educational Video(s) Outcome: Progressing   Problem: Activity: Goal: Ability to tolerate increased activity will improve Outcome: Progressing Goal: Will verbalize the importance of balancing activity with adequate rest periods Outcome: Progressing   Problem: Respiratory: Goal: Ability to maintain a clear airway will improve Outcome: Progressing Goal: Levels of oxygenation will improve Outcome: Progressing Goal: Ability to maintain adequate ventilation will improve Outcome: Progressing   Problem: Activity: Goal: Ability to tolerate increased activity will improve Outcome: Progressing   Problem: Clinical Measurements: Goal: Ability to maintain a body temperature in the normal range will improve Outcome: Progressing   Problem: Respiratory: Goal: Ability to maintain adequate ventilation will improve Outcome: Progressing Goal: Ability to maintain a clear airway will improve Outcome: Progressing   Problem: Education: Goal: Knowledge of disease or  condition will improve Outcome: Progressing Goal: Knowledge of secondary prevention will improve (MUST DOCUMENT ALL) Outcome: Progressing Goal: Knowledge of patient specific risk factors will improve Loraine Leriche N/A or DELETE if not current risk factor) Outcome: Progressing   Problem: Ischemic Stroke/TIA Tissue Perfusion: Goal: Complications of ischemic stroke/TIA will be minimized Outcome: Progressing   Problem: Coping: Goal: Will verbalize positive feelings about self Outcome: Progressing Goal: Will identify appropriate support needs Outcome: Progressing   Problem: Health Behavior/Discharge Planning: Goal: Ability to manage  health-related needs will improve Outcome: Progressing Goal: Goals will be collaboratively established with patient/family Outcome: Progressing   Problem: Self-Care: Goal: Ability to participate in self-care as condition permits will improve Outcome: Progressing Goal: Verbalization of feelings and concerns over difficulty with self-care will improve Outcome: Progressing Goal: Ability to communicate needs accurately will improve Outcome: Progressing

## 2023-11-17 NOTE — Progress Notes (Addendum)
 Nutrition Follow-up  DOCUMENTATION CODES:   Not applicable  INTERVENTION:   -Continue MVI with minerals daily -Continue Magic cup TID with meals, each supplement provides 290 kcal and 9 grams of protein   NUTRITION DIAGNOSIS:   Inadequate oral intake related to inability to eat (pt sedated and ventilated) as evidenced by NPO status.  Progressing; advanced to PO diet on 11/01/23  GOAL:   Patient will meet greater than or equal to 90% of their needs  Progressing   MONITOR:   PO intake, Supplement acceptance, Diet advancement  REASON FOR ASSESSMENT:   Ventilator    ASSESSMENT:   68 y/o female with h/o HTN, HLD, COPD, CHF, thyroid  disease, lung nodule and GERD who is admitted with COPD/asthma exacerbation in the setting of metapneumovirus infection, acute anemia with hemorrhagic shock secondary to duodenal ulcer s/p EGD with clip and Injection 10/28/2023 and s/p IR Embolization on 10/30/2023, AKI and acute CVA with right sided hemiparesis.  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   Pt sleeping soundly at time of visit. She did not arouse to voice. Noted breakfast tray just delivered to pt room.   Pt remains on a carb modified diet. Noted meal completions 50-100%.   Pt oxygenation improving- now on 1.5 L/min O2.   No wt loss noted over the past week.   Per TOC notes, plan to d/c to SNF once medically stable.   Medications reviewed and include colace, lasix , keppra , and senokot.   Labs reviewed: CBGS: 114-254 (inpatient orders for glycemic control are 0-5 units insulin  aspart TID with  meals, 4 units insulin  aspart TID with meals, and 12 units insulin  glargine-yfgn daily). Reviewed DM coordinator note and recommendations on 11/15/23. Noted improvement in CBGS since steroids have been discontinued.   Diet Order:   Diet Order             Diet Carb Modified Fluid consistency: Thin  Diet effective now                   EDUCATION NEEDS:   No education needs have been identified at this time  Skin:  Skin Assessment: Skin Integrity Issues: Skin Integrity Issues:: Incisions Incisions: closed rt groin  Last BM:  11/17/23 (type 6)  Height:   Ht Readings from Last 1 Encounters:  11/12/23 5' (1.524 m)    Weight:   Wt Readings from Last 1 Encounters:  11/17/23 68.6 kg    Ideal Body Weight:  45.45 kg  BMI:  Body mass index is 29.54 kg/m.  Estimated Nutritional Needs:   Kcal:  1600-1800  Protein:  85-100 grams  Fluid:  > 1.6 L    Herschel Lords, RD, LDN, CDCES Registered Dietitian III Certified Diabetes Care and Education Specialist If unable to reach this RD, please use "RD Inpatient" group chat on secure chat between hours of 8am-4 pm daily

## 2023-11-18 DIAGNOSIS — G9341 Metabolic encephalopathy: Secondary | ICD-10-CM | POA: Diagnosis not present

## 2023-11-18 LAB — GLUCOSE, CAPILLARY
Glucose-Capillary: 245 mg/dL — ABNORMAL HIGH (ref 70–99)
Glucose-Capillary: 223 mg/dL — ABNORMAL HIGH (ref 70–99)
Glucose-Capillary: 108 mg/dL — ABNORMAL HIGH (ref 70–99)
Glucose-Capillary: 242 mg/dL — ABNORMAL HIGH (ref 70–99)

## 2023-11-18 MED ORDER — INSULIN GLARGINE-YFGN 100 UNIT/ML ~~LOC~~ SOLN
15.0000 [IU] | Freq: Every day | SUBCUTANEOUS | Status: DC
  Administered 2023-11-19 – 2023-11-20 (×2): 15 [IU] via SUBCUTANEOUS
  Filled 2023-11-18 (×2): qty 0.15

## 2023-11-18 MED ORDER — NITROGLYCERIN 0.4 MG SL SUBL: 0.4000 mg | SUBLINGUAL_TABLET | SUBLINGUAL | Status: DC | PRN

## 2023-11-18 NOTE — Plan of Care (Signed)

## 2023-11-18 NOTE — Progress Notes (Signed)
Progress Note   Patient: Michele House DDU:[id number] DOB: Sep 18, 1956 DOA: 10/02/2023     47 DOS: the patient was seen and examined on 11/18/2023   Brief hospital course: "Michele House is a 68 y.o. female with medical history significant of COPD, chronic respiratory failure on 2 L, hypertension, type 2 diabetes, HFpEF, recent discharge from the hospital 24th November 2024 after hospitalization for COPD exacerbation complicated by respiratory failure.  She presented to the hospital again on 10/02/2023 with chest pain, malaise, cough, shortness of breath, abdominal pain and dysuria.   She was admitted to the hospital for COPD exacerbation, acute on chronic hypoxemic respiratory failure, UTI and pneumonia.  She later developed worsening confusions, MRI brain revealed multiple acute strokes.   Prolonged hospital admission complicated by several issues including hemorrhagic shock requiring transfer to ICU for pressors and GI consultation. EGD showed duodenal ulcer/s that were treated with clipped but later required embolization with IR.  Pt also found with RLE DVT status post IVC filter placement given bleeding anticoagulation contraindicated.   For detailed hospital course and significant events, see TRH progress note by Dr. Myriam House of 11/09/23 and ICU notes from earlier admission.   Further hospital course and management as outlined below.   Assessment and Plan:   Acute metabolic encephalopathy: Improved. Completed course of high-dose IV Solu-Medrol on 10/12/2023 per Neurology Differential diagnosis included autoimmune encephalitis --Delirium precuations --Avoid benzo's & sedating meds   Acute stroke with right hemiplegia: Off of Plavix because of recent acute GI bleeding.   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   New Onset Seizure-like Activity - evening of 11/10/23 - see RN and cross coverage notes --Neurology following - follow up on recommendations --Continue Keppra 500 mg BID --EEG with moderate diffuse encephalopathy --Seizure precautions --PRN Ativan if seizure activity   Acute GI bleeding, bleeding duodenal ulcer:  S/p EGD on 10/28/2023 with clip placed.   S/p IR embolization on 10/30/2023.   --Continue Protonix. --Monitor CBC --Transfuse if Hbg < 7 or less than 8 with active bleeding or hemodynamic instability   Lower extremity pain, more severe in the left, right peroneal DVT: Patient complaining of pain in the lower extremities 1/7.  Initially refused ultrasound of the left lower extremity because of severe pain.  Ultrasound of the right lower extremity showed right calf DVT.  LLE U/S obtained 1/8 after pt agreed - no LLE DVT was seen. Due to high risk of recurrent GI bleeding given recent massive GI bleed requiring embolization by IR, unable to treat with anticoagulation. Vascular surgery placed IVC filter on 11/09/23   Hemorrhagic shock: Resolved Hypotension: Resolved.  Off of vasopressors   Acute blood loss anemia: H&H is stable.  S/p transfusion with 2 units of PRBCs on 10/28/2023, 1 unit of PRBCs on 10/29/2023.  S/p transfusion with 2 units of platelet pheresis on 10/29/2023. H&H is stable.   Pneumonia: Initially completed 5 days of IV ceftriaxone and azithromycin on 10/07/2023. She was restarted on broad-spectrum antibiotics (vancomycin, ceftriaxone and Flagyl) on 10/28/2023 when she decompensated requiring transfer to the ICU. Acute UTI: Urine culture showed strep agalactiae.  Completed antibiotics.   Acute on chronic diastolic CHF: Stable.   S/p diuresis with IV Lasix.  Continue oral Lasix 20 mg daily Monitor volume status   COPD exacerbation: This had previously resolved, but  required additional course of steroids. Pulmonology following Completed  2nd course w/ prednisone taper  Now stable, monitor closely   Acute on chronic hypoxemic respiratory failure, chronic hypercapnic respiratory failure: She was intubated on 10/28/2023 and extubated on 11/01/2023. Currently she is requiring 2 L/min oxygen via nasal cannula. Continues to desat to low/mid 80's when tried on room air. --Supplement O2, target sats > 88% --Bipap nightly   Type II DM with severe hyperglycemia, hypoglycemia  CBG's remain elevated despite insulin increased yetetrday --Appreciate diabetes coordinator input. --Increase Semglee to 15 units at bedtime --Continue Novolog 4units TID WC, hold if < 50% meal consumed --Sliding scale Novolog 0-9 AC / 0-5 HS   Anxiety/panic attacks: improved.   Avoid benzo's due to waxing and waning mental status.   On Zoloft   Non-small cell carcinoma -- pt found to have 1.3 cm right upper lung spiculated nodule: S/p bronchoscopy on 10/20/2023.  Pathology showed rare atypical cells, but unclear if malignancy and findings more consistent with reactive bronchial cells with acute inflammation. Lung biopsy repeated 1/10, results today per Dr. Alena House note. --Follow up with Oncology outpatient --Outpatient PET scan recommended   Hypothyroidism -- pt has been on her Synthroid 100 mcg daily.  Daughter requested 1/11 to have thyroid labs repeated, citing concern for possible Hashimoto's Encephalopathy causing patient's persistent mental status changes.  Advised acute setting and recent illnesses often make TSH and T4 abnormal and not very useful clinically. TSH on 10/06/23 was normal at 3.002 >> now 49.663 Free T4 on 10/06/23 minimally elevated at 1.24 >> now low at 0.38 --Continue Synthroid 150 mcg daily --Repeat TSH and free T4 in 6-8 weeks with PCP --Negative anti-TPO Ab (obtained at daughter's request as she has hashimoto's)     Physical debility : PT and OT recommend discharge to SNF. Placed purewick for urination       Subjective: Pt  denies any new symptoms. Mild shortness of breath Denies fever, chest pain, headache, dizziness, nausea/vomiting, constipation  Physical Exam: Vitals:   11/17/23 2028 11/17/23 2053 11/18/23 0435 11/18/23 0809  BP: 122/77  130/69 108/65  Pulse: 91  89 88  Resp: 19  19 14   Temp: 97.8 F (36.6 C)  98.1 F (36.7 C)   TempSrc: Oral  Oral   SpO2: 97% 98% 94% 100%  Weight:   63.6 kg   Height:       Physical Exam Constitutional:      General: She is not in acute distress. HENT:     Head: Normocephalic and atraumatic.     Nose: Nose normal. No congestion.     Mouth/Throat:     Mouth: Mucous membranes are moist.     Pharynx: Oropharynx is clear. No oropharyngeal exudate.  Eyes:     Extraocular Movements: Extraocular movements intact.     Conjunctiva/sclera: Conjunctivae normal.  Cardiovascular:     Rate and Rhythm: Normal rate and regular rhythm.  Pulmonary:     Effort: Pulmonary effort is normal.     Breath sounds: Normal breath sounds.  Abdominal:     General: Abdomen is flat. Bowel sounds are normal.     Palpations: Abdomen is soft.  Musculoskeletal:        General: No swelling or tenderness. Normal range of motion.     Cervical back: Normal range of motion.  Skin:    General: Skin is warm.     Capillary Refill: Capillary refill takes 2 to 3 seconds.     Findings: No  rash.  Neurological:     Mental Status: Mental status is at baseline.     Motor: No weakness.  Psychiatric:        Mood and Affect: Mood normal.     Data Reviewed:  Latest Reference Range & Units 11/15/23 09:12  Sodium 135 - 145 mmol/L 139  Potassium 3.5 - 5.1 mmol/L 3.8  Chloride 98 - 111 mmol/L 98  CO2 22 - 32 mmol/L 32  Glucose 70 - 99 mg/dL 540 (H)  BUN 8 - 23 mg/dL 10  Creatinine 9.81 - 1.91 mg/dL 4.78  Calcium 8.9 - 29.5 mg/dL 8.7 (L)  Anion gap 5 - 15  9  GFR, Estimated >60 mL/min >60  WBC 4.0 - 10.5 K/uL 4.3  RBC 3.87 - 5.11 MIL/uL 3.07 (L)  Hemoglobin 12.0 - 15.0 g/dL 9.1 (L)  HCT 62.1  - 30.8 % 27.7 (L)  MCV 80.0 - 100.0 fL 90.2  MCH 26.0 - 34.0 pg 29.6  MCHC 30.0 - 36.0 g/dL 65.7  RDW 84.6 - 96.2 % 15.9 (H)  Platelets 150 - 400 K/uL 204  nRBC 0.0 - 0.2 % 0.0  (H): Data is abnormally high (L): Data is abnormally low  Family Communication:   Disposition: Status is: Inpatient   Planned Discharge Destination: Skilled nursing facility    Time spent: 35 minutes  Author: Ernestene Mention, MD 11/18/2023 12:27 PM  For on call review [email or web address].

## 2023-11-18 NOTE — Progress Notes (Signed)
Physical Therapy Treatment Patient Details Name: Michele House MRN: [id number] DOB: [birth year] Today's Date: 11/18/2023   History of Present Illness Patient is a 68 year old with prolonged hospital stay, originally admitted with AMS. 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 She developed respiratory distress requiring mechanical ventilation. Self extubated 12/27 with re-intubation and extubation on 12/30. Pt also found with RLE DVT status post IVC filter placement given bleeding anticoagulation contraindicated. Seizure on 1/8 with transfer to 2nd floor.    PT Comments  Patient making very limited progress towards physical therapy goals. Has slight movement in R UE/LE but not functional for any attempt of OOB mobility along with no trunk activation noted throughout session. TotalA+2 required for bed mobility and to maintain sitting EOB. Attempted lateral lean on L elbow with patient able to push x2 with maxA. With little progress to date, consider decreased frequency and tier ranking next session. Discharge plan remains appropriate.    If plan is discharge home, recommend the following: Two people to help with walking and/or transfers;Two people to help with bathing/dressing/bathroom;Assistance with cooking/housework;Direct supervision/assist for medications management;Assistance with feeding;Direct supervision/assist for financial management;Assist for transportation;Help with stairs or ramp for entrance;Supervision due to cognitive status   Can travel by private vehicle     No  Equipment Recommendations  Other (comment) (TBD)    Recommendations for Other Services       Precautions / Restrictions Precautions Precautions: Fall Precaution Comments: right side hemiparesis     Mobility  Bed Mobility Overal bed mobility: Needs Assistance Bed Mobility: Supine to Sit, Sit to Supine     Supine to sit: Total assist, +2 for physical assistance, +2 for  safety/equipment Sit to supine: Total assist, +2 for physical assistance, +2 for safety/equipment        Transfers                        Ambulation/Gait                   Stairs             Wheelchair Mobility     Tilt Bed    Modified Rankin (Stroke Patients Only)       Balance Overall balance assessment: Needs assistance Sitting-balance support: Bilateral upper extremity supported, No upper extremity supported, Feet supported Sitting balance-Leahy Scale: Zero Sitting balance - Comments: totalA to maintain sitting. No initiation noted in abdominal musculature                                    Cognition Arousal: Alert Behavior During Therapy: Impulsive Overall Cognitive Status: No family/caregiver present to determine baseline cognitive functioning                                          Exercises      General Comments        Pertinent Vitals/Pain Pain Assessment Pain Assessment: No/denies pain    Home Living                          Prior Function            PT Goals (current goals can now be found in the care  plan section) Acute Rehab PT Goals Patient Stated Goal: to be able to get to chair PT Goal Formulation: With patient Time For Goal Achievement: 11/26/23 Potential to Achieve Goals: Poor Progress towards PT goals: Not progressing toward goals - comment    Frequency    Min 1X/week      PT Plan      Co-evaluation              AM-PAC PT "6 Clicks" Mobility   Outcome Measure  Help needed turning from your back to your side while in a flat bed without using bedrails?: Total Help needed moving from lying on your back to sitting on the side of a flat bed without using bedrails?: Total Help needed moving to and from a bed to a chair (including a wheelchair)?: Total Help needed standing up from a chair using your arms (e.g., wheelchair or bedside chair)?:  Total Help needed to walk in hospital room?: Total Help needed climbing 3-5 steps with a railing? : Total 6 Click Score: 6    End of Session   Activity Tolerance: Patient limited by fatigue Patient left: in bed;with call bell/phone within reach;with bed alarm set;with SCD's reapplied Nurse Communication: Mobility status;Precautions PT Visit Diagnosis: Unsteadiness on feet (R26.81);Other abnormalities of gait and mobility (R26.89);Muscle weakness (generalized) (M62.81);Difficulty in walking, not elsewhere classified (R26.2);Hemiplegia and hemiparesis Hemiplegia - Right/Left: Right Hemiplegia - dominant/non-dominant: Dominant Hemiplegia - caused by: Cerebral infarction     Time: 1610-9604 PT Time Calculation (min) (ACUTE ONLY): 14 min  Charges:    $Therapeutic Activity: 8-22 mins PT General Charges $$ ACUTE PT VISIT: 1 Visit                     Maylon Peppers, PT, DPT Physical Therapist - [facility]  [facility]    Laylaa Guevarra A Conor Lata 11/18/2023, 1:39 PM

## 2023-11-18 NOTE — TOC Progression Note (Signed)
Transition of Care [facility]) - Progression Note    Patient Details  Name: [name] MRN: [id number] Date of Birth: 04/17/1956  [facility]) CM/SW Contact  Truddie Hidden, RN Phone Number: 11/18/2023, 2:58 PM  Clinical Narrative:    Vesta Mixer started.     Expected Discharge Plan: Skilled Nursing Facility Barriers to Discharge: Continued Medical Work up  Expected Discharge Plan and Services         Expected Discharge Date: 11/09/23                                     Social Determinants of Health (SDOH) Interventions SDOH Screenings   Food Insecurity: Patient Unable To Answer (10/03/2023)  Recent Concern: Food Insecurity - Food Insecurity Present (09/24/2023)  Housing: High Risk (10/03/2023)  Transportation Needs: Patient Unable To Answer (10/03/2023)  Recent Concern: Transportation Needs - Unmet Transportation Needs (09/24/2023)  Utilities: Patient Unable To Answer (10/03/2023)  Social Connections: Patient Declined (11/02/2023)  Tobacco Use: Medium Risk (11/12/2023)    Readmission Risk Interventions     No data to display

## 2023-11-18 NOTE — Progress Notes (Addendum)
Plan of care is reviewed. Pt has been progressing. She is alert and oriented x 4. She is cooperative, able to follow commands, stable hemodynamically, afebrile, on 2 LPM of O2 NCL, no obvious acute distress noted overnight. She is able to rest well, no complaints. We will continue to monitor.   Filiberto Pinks, RN

## 2023-11-19 DIAGNOSIS — I5030 Unspecified diastolic (congestive) heart failure: Secondary | ICD-10-CM

## 2023-11-19 DIAGNOSIS — I10 Essential (primary) hypertension: Secondary | ICD-10-CM

## 2023-11-19 DIAGNOSIS — J441 Chronic obstructive pulmonary disease with (acute) exacerbation: Secondary | ICD-10-CM

## 2023-11-19 DIAGNOSIS — G9341 Metabolic encephalopathy: Secondary | ICD-10-CM | POA: Diagnosis not present

## 2023-11-19 DIAGNOSIS — J9621 Acute and chronic respiratory failure with hypoxia: Secondary | ICD-10-CM

## 2023-11-19 LAB — GLUCOSE, CAPILLARY
Glucose-Capillary: 259 mg/dL — ABNORMAL HIGH (ref 70–99)
Glucose-Capillary: 249 mg/dL — ABNORMAL HIGH (ref 70–99)
Glucose-Capillary: 248 mg/dL — ABNORMAL HIGH (ref 70–99)
Glucose-Capillary: 237 mg/dL — ABNORMAL HIGH (ref 70–99)

## 2023-11-19 NOTE — Progress Notes (Signed)
NAME:  Michele House, MRN:  [id number], DOB:  28-Aug-1956, LOS: 48 ADMISSION DATE:  10/02/2023   History of Present Illness:  Case of a 68 year old female patient with a past medical history of COPD with chronic respiratory failure on 2 L nasal cannula, hypertension, type 2 diabetes, heart failure with preserved EF who has been here for a month.  Initially presented with altered mental status encephalopathy.  Had an extensive workup including brain MRI which was negative for any acute intracranial abnormality initially.  However unfortunately course complicated by acute CVA with repeat MRI showing multiple acute infarcts in the high bilateral posterior frontal and parietal lobes.  She recovered from that but course further complicated by waxing and waning mental status which prompted an LP to rule out encephalitis and has been unremarkable so far.  She was found to have right upper lobe lung nodule which was concerning for malignancy and underwent robotic assisted navigational bronchoscopy with biopsy on 12/27 which showed very rare atypical cells and reactive alveolar cells mostly.    Pertinent  Medical History  As abnove  Significant Hospital Events: Including procedures, antibiotic start and stop dates in addition to other pertinent events   12/01: Vital stable, respiratory viral panel negative, procalcitonin negative, preliminary blood cultures negative.  Urine cultures are ordered as add-on, ammonia levels mildly elevated at 39, strep pneumo negative, CBG elevated at 244, history of enlarging pulmonary nodule with recommendations for PET/CT during most recent admission which has not been done yet, if video bronchoscopy was scheduled for 10/13/2023 as outpatient. 12/02: Unable to take care of herself and son cannot provide the required care as she required 24-hour supervision due to worsening dementia.  PT is recommending SNF. 12/03: Blood pressure started trending up, restarting home  Zestoretic.  Urine cultures with strep agalactiae, penicillin allergy noted, will continue with ceftriaxone for now.  Also started on Remeron for concern of worsening depression and unable to sleep at night  12/05:  Patient has some blurry vision and weakness and pain.  MRI of the brain negative for acute intracranial abnormality. 12/08:  Patient more confused today than yesterday.  Able to follow some simple commands but not as good of a conversation today as yesterday. 12/09:  Patient still confused today.  Does not follow simple commands today. 12/10: Patient more talkative today but was looking up at the ceiling when she was talking with me.  She does not move her extremities to command but does move them on her own.  Physical therapy and Occupational Therapy did not see her move her right arm. 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  Mild associated petechial hemorrhage.  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 12/12: Patient with new stroke, CTA head and neck with no large vessel occlusion.  Patient had a recent echocardiogram which was normal. Concern of paraneoplastic versus hypercoagulability secondary to malignancy.  Patient missed her appointment for bronchoscopy and outpatient appointment for PET scan due to recurrent hospitalizations. 12/13: Patient continued to have waxing and waning mental status, unable to move right upper and lower extremities.  Having some hallucination.  Pulmonary is planning lung biopsy on 12/27 if she remains stable.  Rapidly progressive dementia and now with underlying stroke, question of paraneoplastic encephalitis and hypercoagulability secondary to underlying undiagnosed malignancy. 12/15: Patient with much  improved mentation.  Having some flickering of movements on right  upper and lower extremity today, left extremities weaker but seems improving.   12/18: Pt underwent bronchoscopy and lung biopsy 12/19: patient stable , she is being optimized for dc for rehab.  She's cleared from pulmonary for discharge. Results from bronch will take 1 week.   12/26: Pt transferred to the stepdown unit with acute hypercapnic respiratory requiring Bipap but subsequently required mechanical intubation due to worsening respiratory failure.  Insulin gtt initiated due to severe hyperglycemia.  With hemorrhagic shock due to bleeding duodenal ulcer, underwent emergent EGD with successful hemostasis by injection of Epinephrine and hemostatic clip. 12/27: Hgb slowly trending down, no report of bleeding from OG suction nor melena. Weaning down vasopressors, if able to continue to wean vasopressors, then can consider WUA/SBT.  Giving temporizing measures for Hyperkalemia. Self extubated but required reintubation. Hgb continued to trend down, given 1 unit pRBC's and 2 FFP.  IR consulted for consideration for embolization. 12/28: Weaned off Vasopressin, Levophed weaning down.  S/p IR embolization of the Duodenal artery. Peak pressures and lung sounds improved with Ketamine, will keep intubated today given rapid reintubation yesterday. 12/30- patient on PRVC FiO2 28%, weaning off ketamine and hoping to perform SBT today.  Holding feeds until post SBT.  11/02/23- patient is stable overnight no acute events, electrllytes improved.  No GI bleeds noted.  Opitimizing for TRH today.  11/03/23 patient is more interactive this am, still has not been able to get OOB.  Will continue to follow her from pulmonary with COPD and lung cancer. Her pathology was sent for second opinion due to mixed reporting from cytotech and pathology report.  11/03/22- patient is good spirits this am. Son came to visit yesterday and we reivewed medical plan. H/h is stable. Following peripherally from pulmonary perspective for COPD and  lung mass.  11/05/23- patient on 3L/min.  She still has 9 day old Right internal jugular central line.  I have placed orders for RN to remove this line today.  She required BIPAP yesterday but states she did not feel SOB>  11/06/23- patient shares she feels well today.  She is on 3L/min but takes it off at times. She reports breathing feels improved 11/07/23- patient with no acute events overnight.  H/h is improved 11/08/23- patient seen at bedside.  Sitting up in bed in no distress denies dyspnea.  She is on nasal canula 2L/min.  She asks if we can help her get glasses and dentures.  I received inconclusive results from lung biopsy. I discussed this with patient.  We discussed repeating the biopsy and patient states she wants this done as soon as possible. She is clinically stable.  Diagnosis Lung, biopsy, Right upper lobe - VERY RARE ATYPICAL CELLS, FAVOR REACTIVE. - FRAGMENTS OF ALVEOLATED LUNG AND BLOOD CLOT. Diagnosis Note Per [facility] recent imaging revealed an enlarging spiculated 1.6 cm right upper lung lesion. This case underwent intradepartmental consultation and Dr. Venetia Night concurs with the interpretation. See concurrent case [id number]. Delice Bison  11/09/23- patient with acute dvt.  Not good candidate for Antcoagulation per GI due to upper GI bleed with duod ulcer 11/10/23- plant for lung biopsy on 11/12/23- reviewed with patient.  NPO Thursday night at midnight  11/11/23- I evaluated patient at bedside, she reports no acute events.  We discussed lung biopsy she is agreeable and wishes to proceed.  She asked if I can help her get glasses and new dentures and I did discuss this with  her son 08/11/24- patient evaluated prior to procedure , on room air saturating 92%. No questions regarding about procedure 11/13/23-patient is on 2L/min [place].  She appears to be in good spirits asking to be discharged.  She does report partial weakness on left side post CVA but on examination from me does not seem to have FND.   She has PT/OT ordered.  She can likely be weaned from oxygen prior to dc home. Her pathology results from lung biopsy are in process.  11/15/23- patient seen at bedside , stable on 3L.  She is being optimized for dc to SNF 11/16/23- patient in bed reports feeling abd pain and dizziness.  She is now on 1.5L/min [place].  Her biopsy results came back as NSCLC and we have contacted oncology.  Ive met with son and reviewed findings.  11/17/23- patient sitting up in bed in NAD.  She is eating lung during my evaluation.  She reports fatigue and is working with PT/OT to be discharged to SNF.  11/19/23- patient seen at bedside with son present.  Patient shares she's feeling better in no pain asking to go home.   Objective   Blood pressure 129/76, pulse (!) 101, temperature 98.1 F (36.7 C), temperature source Oral, resp. rate 19, height 5' (1.524 m), weight 65.3 kg, last menstrual period 12/24/1998, SpO2 92%.        Intake/Output Summary (Last 24 hours) at 11/19/2023 0756 Last data filed at 11/18/2023 2109 Gross per 24 hour  Intake --  Output 900 ml  Net -900 ml    Filed Weights   11/17/23 0600 11/18/23 0435 11/19/23 0500  Weight: 68.6 kg 63.6 kg 65.3 kg    Examination: General: Awake, follows verbal communication HENT: Supple neck reactive pupils  Lungs: mild wheezing b/l Cardiovascular: Normal S1, Normal S2, RRR  Abdomen: Soft, non tender, non distended, +BS  Extremities: Warm and well perfused no edema.   Labs and imaging were reviewed.   Assessment & Plan:    #1Acute hypoxic respiratory failure - RESOLVED  #2COPD/asthma exacerbation in the setting of metapneumovirus - IMPROVED  #3Acute anemia with hemorrhagic shock secondary to Duodenal ulcer s.p EGD clip and Injection 10/28/2023, s/p IR Embolization on 10/30/2023- S/P SLP WITH NOW HAVING NORMAL BM #4Metabolic encephalopathy- RESOLVED  #5Acute CVA during this admission with right sided hemiparesis- ONGOING PT #6AKI - RESOLVED  #7 - 16mm  right upper lobe nodule - reported as lesional carcinoma by ROSE but pathology showed rare atypical cells.  This has been sent for second opinion to be reviewed.  I think its most likely primary lung cancer.  S/p lung biopsy- with NSCLS - reported to Dr Alena Bills oncology   Best Practice (right click and "Reselect all SmartList Selections" daily)   Diet/type: NPO DVT prophylaxis SCD Pressure ulcer(s): N/A GI prophylaxis: PPI Lines: Central line Foley:  Yes, and it is still needed Code Status:  full code     Vida Rigger, M.D.  Pulmonary & Critical Care Medicine

## 2023-11-19 NOTE — Progress Notes (Signed)
Progress Note   Patient: Michele House ZOX:[id number] DOB: 03-31-1956 DOA: 10/02/2023     48 DOS: the patient was seen and examined on 11/19/2023   Brief hospital course: "Michele House is a 68 y.o. female with medical history significant of COPD, chronic respiratory failure on 2 L, hypertension, type 2 diabetes, HFpEF, recent discharge from the hospital 24th November 2024 after hospitalization for COPD exacerbation complicated by respiratory failure.  She presented to the hospital again on 10/02/2023 with chest pain, malaise, cough, shortness of breath, abdominal pain and dysuria.   She was admitted to the hospital for COPD exacerbation, acute on chronic hypoxemic respiratory failure, UTI and pneumonia.  She later developed worsening confusions, MRI brain revealed multiple acute strokes.   Prolonged hospital admission complicated by several issues including hemorrhagic shock requiring transfer to ICU for pressors and GI consultation. EGD showed duodenal ulcer/s that were treated with clipped but later required embolization with IR.  Pt also found with RLE DVT status post IVC filter placement given bleeding anticoagulation contraindicated.   For detailed hospital course and significant events, see TRH progress note by Dr. Myriam Forehand of 11/09/23 and ICU notes from earlier admission.   Further hospital course and management as outlined below.   Assessment and Plan:   Acute metabolic encephalopathy: Improved. Completed course of high-dose IV Solu-Medrol on 10/12/2023 per Neurology Differential diagnosis included autoimmune encephalitis --Delirium precuations --Avoid benzo's & sedating meds   Acute stroke with right hemiplegia: Off of Plavix because of recent acute GI bleeding.   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   New Onset Seizure-like Activity - evening of 11/10/23 - see RN and cross coverage notes --Neurology following - follow up on recommendations --Continue Keppra 500 mg BID --EEG with moderate diffuse encephalopathy --Seizure precautions --PRN Ativan if seizure activity   Acute GI bleeding, bleeding duodenal ulcer:  S/p EGD on 10/28/2023 with clip placed.   S/p IR embolization on 10/30/2023.   --Continue Protonix. --Monitor CBC --Transfuse if Hbg < 7 or less than 8 with active bleeding or hemodynamic instability   Lower extremity pain, more severe in the left, right peroneal DVT: Patient complaining of pain in the lower extremities 1/7.  Initially refused ultrasound of the left lower extremity because of severe pain.  Ultrasound of the right lower extremity showed right calf DVT.  LLE U/S obtained 1/8 after pt agreed - no LLE DVT was seen. Due to high risk of recurrent GI bleeding given recent massive GI bleed requiring embolization by IR, unable to treat with anticoagulation. Vascular surgery placed IVC filter on 11/09/23   Hemorrhagic shock: Resolved Hypotension: Resolved.  Off of vasopressors   Acute blood loss anemia: H&H is stable.  S/p transfusion with 2 units of PRBCs on 10/28/2023, 1 unit of PRBCs on 10/29/2023.  S/p transfusion with 2 units of platelet pheresis on 10/29/2023. H&H is stable.   Pneumonia: Initially completed 5 days of IV ceftriaxone and azithromycin on 10/07/2023. She was restarted on broad-spectrum antibiotics (vancomycin, ceftriaxone and Flagyl) on 10/28/2023 when she decompensated requiring transfer to the ICU. Acute UTI: Urine culture showed strep agalactiae.  Completed antibiotics.   Acute on chronic diastolic CHF: Stable.   S/p diuresis with IV Lasix.  Continue oral Lasix 20 mg daily Monitor volume status   COPD exacerbation: This had previously resolved, but  required additional course of steroids. Pulmonology following Completed  2nd course w/ prednisone taper  Now stable, monitor closely   Acute on chronic hypoxemic respiratory failure, chronic hypercapnic respiratory failure: She was intubated on 10/28/2023 and extubated on 11/01/2023. Currently she is requiring 2 L/min oxygen via nasal cannula. Continues to desat to low/mid 80's when tried on room air. --Supplement O2, target sats > 88% --Bipap nightly   Type II DM with severe hyperglycemia, hypoglycemia  CBG's remain elevated despite insulin increased yetetrday --Appreciate diabetes coordinator input. --Increase Semglee to 15 units at bedtime --Continue Novolog 4units TID WC, hold if < 50% meal consumed --Sliding scale Novolog 0-9 AC / 0-5 HS   Anxiety/panic attacks: improved.   Avoid benzo's due to waxing and waning mental status.   On Zoloft   Non-small cell carcinoma -- pt found to have 1.3 cm right upper lung spiculated nodule: S/p bronchoscopy on 10/20/2023.  Pathology showed rare atypical cells, but unclear if malignancy and findings more consistent with reactive bronchial cells with acute inflammation. Lung biopsy repeated 1/10, results today per Dr. Alena Bills note. --Follow up with Oncology outpatient --Outpatient PET scan recommended   Hypothyroidism -- pt has been on her Synthroid 100 mcg daily.  Daughter requested 1/11 to have thyroid labs repeated, citing concern for possible Hashimoto's Encephalopathy causing patient's persistent mental status changes.  Advised acute setting and recent illnesses often make TSH and T4 abnormal and not very useful clinically. TSH on 10/06/23 was normal at 3.002 >> now 49.663 Free T4 on 10/06/23 minimally elevated at 1.24 >> now low at 0.38 --Continue Synthroid 150 mcg daily --Repeat TSH and free T4 in 6-8 weeks with PCP --Negative anti-TPO Ab (obtained at daughter's request as she has hashimoto's)     Physical debility : PT and OT recommend discharge to SNF. Placed purewick for urination       Subjective: Pt  denies any new symptoms. Mild shortness of breath Denies fever, chest pain, headache, dizziness, nausea/vomiting, constipation  Physical Exam: Vitals:   11/19/23 0346 11/19/23 0500 11/19/23 0801 11/19/23 1202  BP: 129/76  121/71 115/66  Pulse: (!) 101  95 99  Resp: 19  18 18   Temp: 98.1 F (36.7 C)  98 F (36.7 C) 97.7 F (36.5 C)  TempSrc: Oral     SpO2: 92%  98% 94%  Weight:  65.3 kg    Height:       Physical Exam Constitutional:      General: She is not in acute distress. HENT:     Head: Normocephalic and atraumatic.     Nose: Nose normal. No congestion.     Mouth/Throat:     Mouth: Mucous membranes are moist.     Pharynx: Oropharynx is clear. No oropharyngeal exudate.  Eyes:     Extraocular Movements: Extraocular movements intact.     Conjunctiva/sclera: Conjunctivae normal.  Cardiovascular:     Rate and Rhythm: Normal rate and regular rhythm.  Pulmonary:     Effort: Pulmonary effort is normal.     Breath sounds: Normal breath sounds.  Abdominal:     General: Abdomen is flat. Bowel sounds are normal.     Palpations: Abdomen is soft.  Musculoskeletal:        General: No swelling or tenderness. Normal range of motion.     Cervical back: Normal range of motion.  Skin:    General: Skin is warm.     Capillary Refill: Capillary refill takes 2 to 3 seconds.  Findings: No rash.  Neurological:     Mental Status: Mental status is at baseline.     Motor: No weakness.  Psychiatric:        Mood and Affect: Mood normal.     Data Reviewed:  Latest Reference Range & Units 11/15/23 09:12  Sodium 135 - 145 mmol/L 139  Potassium 3.5 - 5.1 mmol/L 3.8  Chloride 98 - 111 mmol/L 98  CO2 22 - 32 mmol/L 32  Glucose 70 - 99 mg/dL 782 (H)  BUN 8 - 23 mg/dL 10  Creatinine 9.56 - 2.13 mg/dL 0.86  Calcium 8.9 - 57.8 mg/dL 8.7 (L)  Anion gap 5 - 15  9  GFR, Estimated >60 mL/min >60  WBC 4.0 - 10.5 K/uL 4.3  RBC 3.87 - 5.11 MIL/uL 3.07 (L)  Hemoglobin 12.0 - 15.0 g/dL 9.1 (L)   HCT 46.9 - 62.9 % 27.7 (L)  MCV 80.0 - 100.0 fL 90.2  MCH 26.0 - 34.0 pg 29.6  MCHC 30.0 - 36.0 g/dL 52.8  RDW 41.3 - 24.4 % 15.9 (H)  Platelets 150 - 400 K/uL 204  nRBC 0.0 - 0.2 % 0.0  (H): Data is abnormally high (L): Data is abnormally low  Family Communication:   Disposition: Status is: Inpatient   Planned Discharge Destination: Skilled nursing facility    Time spent: 35 minutes  Author: Ernestene Mention, MD 11/19/2023 2:52 PM  For on call review [email or web address].

## 2023-11-19 NOTE — Plan of Care (Signed)

## 2023-11-19 NOTE — TOC Progression Note (Signed)
Transition of Care [facility]) - Progression Note    Patient Details  Name: Michele House MRN: [id number] Date of Birth: 1956/05/30  [facility]) CM/SW Contact  Truddie Hidden, RN Phone Number: 11/19/2023, 4:32 PM  Clinical Narrative:    Per Vesta Mixer pending    Expected Discharge Plan: Skilled Nursing Facility Barriers to Discharge: Continued Medical Work up  Expected Discharge Plan and Services         Expected Discharge Date: 11/09/23                                     Social Determinants of Health (SDOH) Interventions SDOH Screenings   Food Insecurity: Patient Unable To Answer (10/03/2023)  Recent Concern: Food Insecurity - Food Insecurity Present (09/24/2023)  Housing: High Risk (10/03/2023)  Transportation Needs: Patient Unable To Answer (10/03/2023)  Recent Concern: Transportation Needs - Unmet Transportation Needs (09/24/2023)  Utilities: Patient Unable To Answer (10/03/2023)  Social Connections: Patient Declined (11/02/2023)  Tobacco Use: Medium Risk (11/12/2023)    Readmission Risk Interventions     No data to display

## 2023-11-19 NOTE — Plan of Care (Signed)

## 2023-11-19 NOTE — Plan of Care (Signed)
Problem: Education: Goal: Ability to describe self-care measures that may prevent or decrease complications (Diabetes Survival Skills Education) will improve 11/19/2023 1739 by Joanne Chars, RN Outcome: Progressing 11/19/2023 1453 by Joanne Chars, RN Outcome: Progressing   Problem: Coping: Goal: Ability to adjust to condition or change in health will improve 11/19/2023 1739 by Joanne Chars, RN Outcome: Progressing 11/19/2023 1453 by Joanne Chars, RN Outcome: Progressing   Problem: Fluid Volume: Goal: Ability to maintain a balanced intake and output will improve 11/19/2023 1739 by Joanne Chars, RN Outcome: Progressing 11/19/2023 1453 by Joanne Chars, RN Outcome: Progressing   Problem: Health Behavior/Discharge Planning: Goal: Ability to identify and utilize available resources and services will improve 11/19/2023 1739 by Joanne Chars, RN Outcome: Progressing 11/19/2023 1453 by Joanne Chars, RN Outcome: Progressing Goal: Ability to manage health-related needs will improve 11/19/2023 1739 by Joanne Chars, RN Outcome: Progressing 11/19/2023 1453 by Joanne Chars, RN Outcome: Progressing   Problem: Metabolic: Goal: Ability to maintain appropriate glucose levels will improve 11/19/2023 1739 by Joanne Chars, RN Outcome: Progressing 11/19/2023 1453 by Joanne Chars, RN Outcome: Progressing   Problem: Nutritional: Goal: Maintenance of adequate nutrition will improve 11/19/2023 1739 by Joanne Chars, RN Outcome: Progressing 11/19/2023 1453 by Joanne Chars, RN Outcome: Progressing Goal: Progress toward achieving an optimal weight will improve 11/19/2023 1739 by Joanne Chars, RN Outcome: Progressing 11/19/2023 1453 by Joanne Chars, RN Outcome: Progressing   Problem: Skin Integrity: Goal: Risk for impaired skin integrity will decrease 11/19/2023 1739 by Joanne Chars, RN Outcome: Progressing 11/19/2023 1453 by Joanne Chars, RN Outcome: Progressing   Problem: Tissue  Perfusion: Goal: Adequacy of tissue perfusion will improve 11/19/2023 1739 by Joanne Chars, RN Outcome: Progressing 11/19/2023 1453 by Joanne Chars, RN Outcome: Progressing   Problem: Education: Goal: Knowledge of General Education information will improve Description: Including pain rating scale, medication(s)/side effects and non-pharmacologic comfort measures 11/19/2023 1739 by Joanne Chars, RN Outcome: Progressing 11/19/2023 1453 by Joanne Chars, RN Outcome: Progressing   Problem: Health Behavior/Discharge Planning: Goal: Ability to manage health-related needs will improve 11/19/2023 1739 by Joanne Chars, RN Outcome: Progressing 11/19/2023 1453 by Joanne Chars, RN Outcome: Progressing   Problem: Clinical Measurements: Goal: Ability to maintain clinical measurements within normal limits will improve 11/19/2023 1739 by Joanne Chars, RN Outcome: Progressing 11/19/2023 1453 by Joanne Chars, RN Outcome: Progressing Goal: Will remain free from infection 11/19/2023 1739 by Joanne Chars, RN Outcome: Progressing 11/19/2023 1453 by Joanne Chars, RN Outcome: Progressing Goal: Diagnostic test results will improve 11/19/2023 1739 by Joanne Chars, RN Outcome: Progressing 11/19/2023 1453 by Joanne Chars, RN Outcome: Progressing Goal: Respiratory complications will improve 11/19/2023 1739 by Joanne Chars, RN Outcome: Progressing 11/19/2023 1453 by Joanne Chars, RN Outcome: Progressing Goal: Cardiovascular complication will be avoided 11/19/2023 1739 by Joanne Chars, RN Outcome: Progressing 11/19/2023 1453 by Joanne Chars, RN Outcome: Progressing   Problem: Activity: Goal: Risk for activity intolerance will decrease 11/19/2023 1739 by Joanne Chars, RN Outcome: Progressing 11/19/2023 1453 by Joanne Chars, RN Outcome: Progressing   Problem: Coping: Goal: Level of anxiety will decrease 11/19/2023 1739 by Joanne Chars, RN Outcome: Progressing 11/19/2023 1453 by Joanne Chars, RN Outcome: Progressing   Problem: Elimination: Goal: Will not experience complications related to bowel motility 11/19/2023 1739 by Joanne Chars, RN Outcome: Progressing 11/19/2023 1453 by Joanne Chars, RN Outcome: Progressing Goal: Will not experience complications related to urinary retention 11/19/2023 1739 by Joanne Chars, RN Outcome: Progressing 11/19/2023 1453 by Joanne Chars, RN Outcome: Progressing   Problem: Activity: Goal: Ability to  tolerate increased activity will improve 11/19/2023 1739 by Joanne Chars, RN Outcome: Progressing 11/19/2023 1453 by Joanne Chars, RN Outcome: Progressing Goal: Will verbalize the importance of balancing activity with adequate rest periods 11/19/2023 1739 by Joanne Chars, RN Outcome: Progressing 11/19/2023 1453 by Joanne Chars, RN Outcome: Progressing   Problem: Respiratory: Goal: Ability to maintain a clear airway will improve 11/19/2023 1739 by Joanne Chars, RN Outcome: Progressing 11/19/2023 1453 by Joanne Chars, RN Outcome: Progressing Goal: Levels of oxygenation will improve 11/19/2023 1739 by Joanne Chars, RN Outcome: Progressing 11/19/2023 1453 by Joanne Chars, RN Outcome: Progressing Goal: Ability to maintain adequate ventilation will improve 11/19/2023 1739 by Joanne Chars, RN Outcome: Progressing 11/19/2023 1453 by Joanne Chars, RN Outcome: Progressing   Problem: Activity: Goal: Ability to tolerate increased activity will improve 11/19/2023 1739 by Joanne Chars, RN Outcome: Progressing 11/19/2023 1453 by Joanne Chars, RN Outcome: Progressing   Problem: Clinical Measurements: Goal: Ability to maintain a body temperature in the normal range will improve 11/19/2023 1739 by Joanne Chars, RN Outcome: Progressing 11/19/2023 1453 by Joanne Chars, RN Outcome: Progressing   Problem: Ischemic Stroke/TIA Tissue Perfusion: Goal: Complications of ischemic stroke/TIA will be minimized 11/19/2023 1739 by Joanne Chars, RN Outcome: Progressing 11/19/2023 1453 by Joanne Chars, RN Outcome: Progressing   Problem: Coping: Goal: Will verbalize positive feelings about self 11/19/2023 1739 by Joanne Chars, RN Outcome: Progressing 11/19/2023 1453 by Joanne Chars, RN Outcome: Progressing Goal: Will identify appropriate support needs 11/19/2023 1739 by Joanne Chars, RN Outcome: Progressing 11/19/2023 1453 by Joanne Chars, RN Outcome: Progressing   Problem: Health Behavior/Discharge Planning: Goal: Ability to manage health-related needs will improve 11/19/2023 1739 by Joanne Chars, RN Outcome: Progressing 11/19/2023 1453 by Joanne Chars, RN Outcome: Progressing Goal: Goals will be collaboratively established with patient/family 11/19/2023 1739 by Joanne Chars, RN Outcome: Progressing 11/19/2023 1453 by Joanne Chars, RN Outcome: Progressing   Problem: Self-Care: Goal: Ability to participate in self-care as condition permits will improve 11/19/2023 1739 by Joanne Chars, RN Outcome: Progressing 11/19/2023 1453 by Joanne Chars, RN Outcome: Progressing Goal: Verbalization of feelings and concerns over difficulty with self-care will improve 11/19/2023 1739 by Joanne Chars, RN Outcome: Progressing 11/19/2023 1453 by Joanne Chars, RN Outcome: Progressing Goal: Ability to communicate needs accurately will improve 11/19/2023 1739 by Joanne Chars, RN Outcome: Progressing 11/19/2023 1453 by Joanne Chars, RN Outcome: Progressing   Problem: Nutrition: Goal: Risk of aspiration will decrease Outcome: Progressing Goal: Dietary intake will improve Outcome: Progressing   Problem: Education: Goal: Knowledge of disease or condition will improve Outcome: Progressing Goal: Knowledge of secondary prevention will improve (MUST DOCUMENT ALL) Outcome: Progressing Goal: Knowledge of patient specific risk factors will improve Loraine Leriche N/A or DELETE if not current risk factor) Outcome: Progressing   Problem:  Ischemic Stroke/TIA Tissue Perfusion: Goal: Complications of ischemic stroke/TIA will be minimized Outcome: Progressing   Problem: Coping: Goal: Will verbalize positive feelings about self Outcome: Progressing Goal: Will identify appropriate support needs Outcome: Progressing   Problem: Health Behavior/Discharge Planning: Goal: Ability to manage health-related needs will improve Outcome: Progressing Goal: Goals will be collaboratively established with patient/family Outcome: Progressing   Problem: Self-Care: Goal: Ability to participate in self-care as condition permits will improve Outcome: Progressing Goal: Verbalization of feelings and concerns over difficulty with self-care will improve Outcome: Progressing Goal: Ability to communicate needs accurately will improve Outcome: Progressing   Problem: Nutrition: Goal: Risk of aspiration will decrease Outcome: Progressing Goal: Dietary intake will improve Outcome: Progressing   Problem: Education: Goal: Understanding  of CV disease, CV risk reduction, and recovery process will improve Outcome: Progressing Goal: Individualized Educational Video(s) Outcome: Progressing   Problem: Activity: Goal: Ability to return to baseline activity level will improve Outcome: Progressing   Problem: Cardiovascular: Goal: Ability to achieve and maintain adequate cardiovascular perfusion will improve Outcome: Progressing Goal: Vascular access site(s) Level 0-1 will be maintained Outcome: Progressing   Problem: Health Behavior/Discharge Planning: Goal: Ability to safely manage health-related needs after discharge will improve Outcome: Progressing   Problem: Activity: Goal: Ability to tolerate increased activity will improve Outcome: Progressing   Problem: Respiratory: Goal: Ability to maintain a clear airway and adequate ventilation will improve Outcome: Progressing

## 2023-11-20 DIAGNOSIS — Z515 Encounter for palliative care: Secondary | ICD-10-CM | POA: Diagnosis not present

## 2023-11-20 DIAGNOSIS — G9341 Metabolic encephalopathy: Secondary | ICD-10-CM | POA: Diagnosis not present

## 2023-11-20 DIAGNOSIS — J441 Chronic obstructive pulmonary disease with (acute) exacerbation: Secondary | ICD-10-CM | POA: Diagnosis not present

## 2023-11-20 DIAGNOSIS — R911 Solitary pulmonary nodule: Secondary | ICD-10-CM | POA: Diagnosis not present

## 2023-11-20 LAB — CBC
HCT: 32 % — ABNORMAL LOW (ref 36.0–46.0)
Hemoglobin: 10.4 g/dL — ABNORMAL LOW (ref 12.0–15.0)
MCH: 29.6 pg (ref 26.0–34.0)
MCHC: 32.5 g/dL (ref 30.0–36.0)
MCV: 91.2 fL (ref 80.0–100.0)
Platelets: 248 10*3/uL (ref 150–400)
RBC: 3.51 MIL/uL — ABNORMAL LOW (ref 3.87–5.11)
RDW: 16.3 % — ABNORMAL HIGH (ref 11.5–15.5)
WBC: 4.9 10*3/uL (ref 4.0–10.5)
nRBC: 0 % (ref 0.0–0.2)

## 2023-11-20 LAB — GLUCOSE, CAPILLARY
Glucose-Capillary: 238 mg/dL — ABNORMAL HIGH (ref 70–99)
Glucose-Capillary: 228 mg/dL — ABNORMAL HIGH (ref 70–99)
Glucose-Capillary: 235 mg/dL — ABNORMAL HIGH (ref 70–99)
Glucose-Capillary: 92 mg/dL (ref 70–99)

## 2023-11-20 LAB — BASIC METABOLIC PANEL
Anion gap: 12 (ref 5–15)
BUN: 17 mg/dL (ref 8–23)
CO2: 29 mmol/L (ref 22–32)
Calcium: 9.1 mg/dL (ref 8.9–10.3)
Chloride: 99 mmol/L (ref 98–111)
Creatinine, Ser: 0.86 mg/dL (ref 0.44–1.00)
GFR, Estimated: >60 mL/min (ref >60)
Glucose, Bld: 181 mg/dL — ABNORMAL HIGH (ref 70–99)
Potassium: 3.5 mmol/L (ref 3.5–5.1)
Sodium: 140 mmol/L (ref 135–145)

## 2023-11-20 LAB — PHOSPHORUS: Phosphorus: 3.5 mg/dL (ref 2.5–4.6)

## 2023-11-20 LAB — MAGNESIUM: Magnesium: 2.1 mg/dL (ref 1.7–2.4)

## 2023-11-20 LAB — VITAMIN D 25 HYDROXY (VIT D DEFICIENCY, FRACTURES): Vit D, 25-Hydroxy: 36.82 ng/mL (ref 30–100)

## 2023-11-20 MED ORDER — INSULIN GLARGINE-YFGN 100 UNIT/ML ~~LOC~~ SOLN
18.0000 [IU] | Freq: Every day | SUBCUTANEOUS | Status: DC
Start: 2023-11-21 — End: 2023-12-03
  Administered 2023-11-21 – 2023-12-02 (×12): 18 [IU] via SUBCUTANEOUS
  Filled 2023-11-20 (×12): qty 0.18

## 2023-11-20 NOTE — Plan of Care (Signed)
  Problem: Education: Goal: Ability to describe self-care measures that may prevent or decrease complications (Diabetes Survival Skills Education) will improve Outcome: Progressing   Problem: Coping: Goal: Ability to adjust to condition or change in health will improve Outcome: Progressing   Problem: Health Behavior/Discharge Planning: Goal: Ability to identify and utilize available resources and services will improve Outcome: Progressing Goal: Ability to manage health-related needs will improve Outcome: Progressing   Problem: Metabolic: Goal: Ability to maintain appropriate glucose levels will improve Outcome: Progressing

## 2023-11-20 NOTE — Plan of Care (Signed)
Problem: Education: Goal: Ability to describe self-care measures that may prevent or decrease complications (Diabetes Survival Skills Education) will improve 11/20/2023 1643 by Joanne Chars, RN Outcome: Progressing 11/20/2023 0856 by Joanne Chars, RN Outcome: Progressing   Problem: Coping: Goal: Ability to adjust to condition or change in health will improve 11/20/2023 1643 by Joanne Chars, RN Outcome: Progressing 11/20/2023 0856 by Joanne Chars, RN Outcome: Progressing   Problem: Fluid Volume: Goal: Ability to maintain a balanced intake and output will improve 11/20/2023 1643 by Joanne Chars, RN Outcome: Progressing 11/20/2023 0856 by Joanne Chars, RN Outcome: Progressing   Problem: Health Behavior/Discharge Planning: Goal: Ability to identify and utilize available resources and services will improve 11/20/2023 1643 by Joanne Chars, RN Outcome: Progressing 11/20/2023 0856 by Joanne Chars, RN Outcome: Progressing Goal: Ability to manage health-related needs will improve 11/20/2023 1643 by Joanne Chars, RN Outcome: Progressing 11/20/2023 0856 by Joanne Chars, RN Outcome: Progressing   Problem: Metabolic: Goal: Ability to maintain appropriate glucose levels will improve 11/20/2023 1643 by Joanne Chars, RN Outcome: Progressing 11/20/2023 0856 by Joanne Chars, RN Outcome: Progressing   Problem: Nutritional: Goal: Maintenance of adequate nutrition will improve 11/20/2023 1643 by Joanne Chars, RN Outcome: Progressing 11/20/2023 0856 by Joanne Chars, RN Outcome: Progressing Goal: Progress toward achieving an optimal weight will improve 11/20/2023 1643 by Joanne Chars, RN Outcome: Progressing 11/20/2023 0856 by Joanne Chars, RN Outcome: Progressing   Problem: Skin Integrity: Goal: Risk for impaired skin integrity will decrease 11/20/2023 1643 by Joanne Chars, RN Outcome: Progressing 11/20/2023 0856 by Joanne Chars, RN Outcome: Progressing   Problem: Tissue  Perfusion: Goal: Adequacy of tissue perfusion will improve 11/20/2023 1643 by Joanne Chars, RN Outcome: Progressing 11/20/2023 0856 by Joanne Chars, RN Outcome: Progressing   Problem: Education: Goal: Knowledge of General Education information will improve Description: Including pain rating scale, medication(s)/side effects and non-pharmacologic comfort measures 11/20/2023 1643 by Joanne Chars, RN Outcome: Progressing 11/20/2023 0856 by Joanne Chars, RN Outcome: Progressing   Problem: Health Behavior/Discharge Planning: Goal: Ability to manage health-related needs will improve 11/20/2023 1643 by Joanne Chars, RN Outcome: Progressing 11/20/2023 0856 by Joanne Chars, RN Outcome: Progressing   Problem: Clinical Measurements: Goal: Ability to maintain clinical measurements within normal limits will improve 11/20/2023 1643 by Joanne Chars, RN Outcome: Progressing 11/20/2023 0856 by Joanne Chars, RN Outcome: Progressing Goal: Will remain free from infection 11/20/2023 1643 by Joanne Chars, RN Outcome: Progressing 11/20/2023 0856 by Joanne Chars, RN Outcome: Progressing Goal: Diagnostic test results will improve 11/20/2023 1643 by Joanne Chars, RN Outcome: Progressing 11/20/2023 0856 by Joanne Chars, RN Outcome: Progressing Goal: Respiratory complications will improve 11/20/2023 1643 by Joanne Chars, RN Outcome: Progressing 11/20/2023 0856 by Joanne Chars, RN Outcome: Progressing Goal: Cardiovascular complication will be avoided 11/20/2023 1643 by Joanne Chars, RN Outcome: Progressing 11/20/2023 0856 by Joanne Chars, RN Outcome: Progressing   Problem: Activity: Goal: Risk for activity intolerance will decrease 11/20/2023 1643 by Joanne Chars, RN Outcome: Progressing 11/20/2023 0856 by Joanne Chars, RN Outcome: Progressing   Problem: Nutrition: Goal: Adequate nutrition will be maintained 11/20/2023 1643 by Joanne Chars, RN Outcome: Progressing 11/20/2023 0856 by  Joanne Chars, RN Outcome: Progressing   Problem: Coping: Goal: Level of anxiety will decrease 11/20/2023 1643 by Joanne Chars, RN Outcome: Progressing 11/20/2023 0856 by Joanne Chars, RN Outcome: Progressing   Problem: Elimination: Goal: Will not experience complications related to bowel motility 11/20/2023 1643 by Joanne Chars, RN Outcome: Progressing 11/20/2023 0856 by Joanne Chars, RN Outcome: Progressing Goal: Will not experience complications related  to urinary retention 11/20/2023 1643 by Joanne Chars, RN Outcome: Progressing 11/20/2023 0856 by Joanne Chars, RN Outcome: Progressing   Problem: Safety: Goal: Ability to remain free from injury will improve 11/20/2023 1643 by Joanne Chars, RN Outcome: Progressing 11/20/2023 0856 by Joanne Chars, RN Outcome: Progressing   Problem: Pain Management: Goal: General experience of comfort will improve 11/20/2023 1643 by Joanne Chars, RN Outcome: Progressing 11/20/2023 0856 by Joanne Chars, RN Outcome: Progressing   Problem: Skin Integrity: Goal: Risk for impaired skin integrity will decrease 11/20/2023 1643 by Joanne Chars, RN Outcome: Progressing 11/20/2023 0856 by Joanne Chars, RN Outcome: Progressing   Problem: Activity: Goal: Ability to tolerate increased activity will improve 11/20/2023 1643 by Joanne Chars, RN Outcome: Progressing 11/20/2023 0856 by Joanne Chars, RN Outcome: Progressing   Problem: Clinical Measurements: Goal: Ability to maintain a body temperature in the normal range will improve 11/20/2023 1643 by Joanne Chars, RN Outcome: Progressing 11/20/2023 0856 by Joanne Chars, RN Outcome: Progressing   Problem: Respiratory: Goal: Ability to maintain adequate ventilation will improve 11/20/2023 1643 by Joanne Chars, RN Outcome: Progressing 11/20/2023 0856 by Joanne Chars, RN Outcome: Progressing Goal: Ability to maintain a clear airway will improve 11/20/2023 1643 by Joanne Chars, RN Outcome:  Progressing 11/20/2023 0856 by Joanne Chars, RN Outcome: Progressing   Problem: Education: Goal: Knowledge of disease or condition will improve 11/20/2023 1643 by Joanne Chars, RN Outcome: Progressing 11/20/2023 0856 by Joanne Chars, RN Outcome: Progressing Goal: Knowledge of secondary prevention will improve (MUST DOCUMENT ALL) 11/20/2023 1643 by Joanne Chars, RN Outcome: Progressing 11/20/2023 0856 by Joanne Chars, RN Outcome: Progressing Goal: Knowledge of patient specific risk factors will improve Loraine Leriche N/A or DELETE if not current risk factor) 11/20/2023 1643 by Joanne Chars, RN Outcome: Progressing 11/20/2023 0856 by Joanne Chars, RN Outcome: Progressing   Problem: Ischemic Stroke/TIA Tissue Perfusion: Goal: Complications of ischemic stroke/TIA will be minimized 11/20/2023 1643 by Joanne Chars, RN Outcome: Progressing 11/20/2023 0856 by Joanne Chars, RN Outcome: Progressing   Problem: Coping: Goal: Will verbalize positive feelings about self 11/20/2023 1643 by Joanne Chars, RN Outcome: Progressing 11/20/2023 0856 by Joanne Chars, RN Outcome: Progressing Goal: Will identify appropriate support needs 11/20/2023 1643 by Joanne Chars, RN Outcome: Progressing 11/20/2023 0856 by Joanne Chars, RN Outcome: Progressing   Problem: Self-Care: Goal: Ability to participate in self-care as condition permits will improve 11/20/2023 1643 by Joanne Chars, RN Outcome: Progressing 11/20/2023 0856 by Joanne Chars, RN Outcome: Progressing Goal: Verbalization of feelings and concerns over difficulty with self-care will improve 11/20/2023 1643 by Joanne Chars, RN Outcome: Progressing 11/20/2023 0856 by Joanne Chars, RN Outcome: Progressing Goal: Ability to communicate needs accurately will improve 11/20/2023 1643 by Joanne Chars, RN Outcome: Progressing 11/20/2023 0856 by Joanne Chars, RN Outcome: Progressing   Problem: Self-Care: Goal: Ability to communicate needs accurately  will improve 11/20/2023 1643 by Joanne Chars, RN Outcome: Progressing 11/20/2023 0856 by Joanne Chars, RN Outcome: Progressing

## 2023-11-20 NOTE — Plan of Care (Signed)
Problem: Education: Goal: Ability to describe self-care measures that may prevent or decrease complications (Diabetes Survival Skills Education) will improve Outcome: Progressing   Problem: Coping: Goal: Ability to adjust to condition or change in health will improve Outcome: Progressing   Problem: Fluid Volume: Goal: Ability to maintain a balanced intake and output will improve Outcome: Progressing   Problem: Health Behavior/Discharge Planning: Goal: Ability to identify and utilize available resources and services will improve Outcome: Progressing Goal: Ability to manage health-related needs will improve Outcome: Progressing   Problem: Metabolic: Goal: Ability to maintain appropriate glucose levels will improve Outcome: Progressing   Problem: Nutritional: Goal: Maintenance of adequate nutrition will improve Outcome: Progressing Goal: Progress toward achieving an optimal weight will improve Outcome: Progressing   Problem: Skin Integrity: Goal: Risk for impaired skin integrity will decrease Outcome: Progressing   Problem: Tissue Perfusion: Goal: Adequacy of tissue perfusion will improve Outcome: Progressing   Problem: Education: Goal: Knowledge of General Education information will improve Description: Including pain rating scale, medication(s)/side effects and non-pharmacologic comfort measures Outcome: Progressing   Problem: Health Behavior/Discharge Planning: Goal: Ability to manage health-related needs will improve Outcome: Progressing   Problem: Clinical Measurements: Goal: Ability to maintain clinical measurements within normal limits will improve Outcome: Progressing Goal: Will remain free from infection Outcome: Progressing Goal: Diagnostic test results will improve Outcome: Progressing Goal: Respiratory complications will improve Outcome: Progressing Goal: Cardiovascular complication will be avoided Outcome: Progressing   Problem: Activity: Goal:  Risk for activity intolerance will decrease Outcome: Progressing   Problem: Nutrition: Goal: Adequate nutrition will be maintained Outcome: Progressing   Problem: Coping: Goal: Level of anxiety will decrease Outcome: Progressing   Problem: Elimination: Goal: Will not experience complications related to bowel motility Outcome: Progressing Goal: Will not experience complications related to urinary retention Outcome: Progressing   Problem: Pain Management: Goal: General experience of comfort will improve Outcome: Progressing   Problem: Safety: Goal: Ability to remain free from injury will improve Outcome: Progressing   Problem: Skin Integrity: Goal: Risk for impaired skin integrity will decrease Outcome: Progressing   Problem: Education: Goal: Knowledge of disease or condition will improve Outcome: Progressing Goal: Knowledge of the prescribed therapeutic regimen will improve Outcome: Progressing Goal: Individualized Educational Video(s) Outcome: Progressing   Problem: Activity: Goal: Ability to tolerate increased activity will improve Outcome: Progressing Goal: Will verbalize the importance of balancing activity with adequate rest periods Outcome: Progressing   Problem: Respiratory: Goal: Ability to maintain a clear airway will improve Outcome: Progressing Goal: Levels of oxygenation will improve Outcome: Progressing Goal: Ability to maintain adequate ventilation will improve Outcome: Progressing   Problem: Activity: Goal: Ability to tolerate increased activity will improve Outcome: Progressing   Problem: Respiratory: Goal: Ability to maintain adequate ventilation will improve Outcome: Progressing Goal: Ability to maintain a clear airway will improve Outcome: Progressing   Problem: Education: Goal: Knowledge of disease or condition will improve Outcome: Progressing Goal: Knowledge of secondary prevention will improve (MUST DOCUMENT ALL) Outcome:  Progressing Goal: Knowledge of patient specific risk factors will improve Michele House N/A or DELETE if not current risk factor) Outcome: Progressing   Problem: Ischemic Stroke/TIA Tissue Perfusion: Goal: Complications of ischemic stroke/TIA will be minimized Outcome: Progressing   Problem: Coping: Goal: Will verbalize positive feelings about self Outcome: Progressing Goal: Will identify appropriate support needs Outcome: Progressing   Problem: Health Behavior/Discharge Planning: Goal: Ability to manage health-related needs will improve Outcome: Progressing Goal: Goals will be collaboratively established with patient/family Outcome: Progressing   Problem: Self-Care:  Goal: Ability to participate in self-care as condition permits will improve Outcome: Progressing Goal: Verbalization of feelings and concerns over difficulty with self-care will improve Outcome: Progressing Goal: Ability to communicate needs accurately will improve Outcome: Progressing

## 2023-11-20 NOTE — TOC Progression Note (Signed)
Transition of Care [facility]) - Progression Note    Patient Details  Name: Michele House MRN: [id number] Date of Birth: January 17, 1956  Transition of Care [facility]) CM/SW Contact  Bing Quarry, RN Phone Number: 11/20/2023, 11:25 AM  Clinical Narrative:  1/18: Insurance authorization for [facility] remains pending in the [facility] portal as of 1130 am 11/20/23.    Gabriel Cirri MSN RN CM  RN Case Manager [place]  Transitions of Care Direct Dial: [phone] (Weekends Only) [facility] Main Office Phone: [phone] [facility] Fax: [phone] [email or web address]      Expected Discharge Plan: Skilled Nursing Facility Barriers to Discharge: Continued Medical Work up  Expected Discharge Plan and Services         Expected Discharge Date: 11/09/23                                     Social Determinants of Health (SDOH) Interventions SDOH Screenings   Food Insecurity: Patient Unable To Answer (10/03/2023)  Recent Concern: Food Insecurity - Food Insecurity Present (09/24/2023)  Housing: High Risk (10/03/2023)  Transportation Needs: Patient Unable To Answer (10/03/2023)  Recent Concern: Transportation Needs - Unmet Transportation Needs (09/24/2023)  Utilities: Patient Unable To Answer (10/03/2023)  Social Connections: Patient Declined (11/02/2023)  Tobacco Use: Medium Risk (11/12/2023)    Readmission Risk Interventions     No data to display

## 2023-11-20 NOTE — Progress Notes (Signed)
Progress Note   Patient: Michele House ZOX:[id number] DOB: 1956-01-27 DOA: 10/02/2023     49 DOS: the patient was seen and examined on 11/20/2023   Brief hospital course: "Michele House is a 68 y.o. female with medical history significant of COPD, chronic respiratory failure on 2 L, hypertension, type 2 diabetes, HFpEF, recent discharge from the hospital 24th November 2024 after hospitalization for COPD exacerbation complicated by respiratory failure.  She presented to the hospital again on 10/02/2023 with chest pain, malaise, cough, shortness of breath, abdominal pain and dysuria.   She was admitted to the hospital for COPD exacerbation, acute on chronic hypoxemic respiratory failure, UTI and pneumonia.  She later developed worsening confusions, MRI brain revealed multiple acute strokes.   Prolonged hospital admission complicated by several issues including hemorrhagic shock requiring transfer to ICU for pressors and GI consultation. EGD showed duodenal ulcer/s that were treated with clipped but later required embolization with IR.  Pt also found with RLE DVT status post IVC filter placement given bleeding anticoagulation contraindicated.   For detailed hospital course and significant events, see TRH progress note by Dr. Myriam Forehand of 11/09/23 and ICU notes from earlier admission.   Further hospital course and management as outlined below.   Assessment and Plan:   Acute metabolic encephalopathy: Improved. Completed course of high-dose IV Solu-Medrol on 10/12/2023 per Neurology Differential diagnosis included autoimmune encephalitis --Delirium precuations --Avoid benzo's & sedating meds   Acute stroke with right hemiplegia: Off of Plavix because of recent acute GI bleeding.   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   New Onset Seizure-like Activity - evening of 11/10/23 - see RN and cross coverage notes --Neurology gave their recommendations --Continue Keppra 500 mg BID --EEG with moderate diffuse encephalopathy --Seizure precautions --PRN Ativan if seizure activity   Acute GI bleeding, bleeding duodenal ulcer:  S/p EGD on 10/28/2023 with clip placed.   S/p IR embolization on 10/30/2023.   --Continue Protonix. --Monitor CBC --Transfuse if Hbg < 7 or less than 8 with active bleeding or hemodynamic instability   Lower extremity pain, more severe in the left, right peroneal DVT: Patient complaining of pain in the lower extremities 1/7.  Initially refused ultrasound of the left lower extremity because of severe pain.  Ultrasound of the right lower extremity showed right calf DVT.  LLE U/S obtained 1/8 after pt agreed - no LLE DVT was seen. Due to high risk of recurrent GI bleeding given recent massive GI bleed requiring embolization by IR, unable to treat with anticoagulation. Vascular surgery placed IVC filter on 11/09/23 Has mild-moderate bony tenderness in both legs: Obtain Vitamin D level   Hemorrhagic shock: Resolved Hypotension: Resolved.  Off of vasopressors   Acute blood loss anemia: H&H is stable.  S/p transfusion with 2 units of PRBCs on 10/28/2023, 1 unit of PRBCs on 10/29/2023.  S/p transfusion with 2 units of platelet pheresis on 10/29/2023. H&H is stable.   Pneumonia: Initially completed 5 days of IV ceftriaxone and azithromycin on 10/07/2023. She was restarted on broad-spectrum antibiotics (vancomycin, ceftriaxone and Flagyl) on 10/28/2023 when she decompensated requiring transfer to the ICU. Acute UTI: Urine culture showed strep agalactiae.  Completed antibiotics.   Acute on chronic diastolic CHF: Stable.   S/p diuresis with IV Lasix.  Continue oral Lasix 20 mg daily Monitor volume status  COPD exacerbation: This had previously resolved, but required  additional course of steroids. Pulmonology following Completed 2nd course w/ prednisone taper  Now stable, monitor closely   Acute on chronic hypoxemic respiratory failure, chronic hypercapnic respiratory failure: She was intubated on 10/28/2023 and extubated on 11/01/2023. Currently she is requiring 2 L/min oxygen via nasal cannula. Continues to desat to low/mid 80's when tried on room air. --Supplement O2, target sats > 88% --Bipap nightly   Type II DM with severe hyperglycemia CBG's remain elevated despite insulin increased yetetrday --Appreciate diabetes coordinator input. --Increase Semglee to 18 units at bedtime --Continue Novolog 4units TID WC, hold if < 50% meal consumed --Sliding scale Novolog 0-9 AC / 0-5 HS   Anxiety/panic attacks: improved.   Avoid benzo's due to waxing and waning mental status.   On Zoloft   Non-small cell carcinoma -- pt found to have 1.3 cm right upper lung spiculated nodule: S/p bronchoscopy on 10/20/2023.  Pathology showed rare atypical cells, but unclear if malignancy and findings more consistent with reactive bronchial cells with acute inflammation. Lung biopsy repeated 1/10, results today per Dr. Alena Bills note. --Follow up with Oncology outpatient --Outpatient PET scan recommended   Hypothyroidism -- pt has been on her Synthroid 100 mcg daily.  Daughter requested 1/11 to have thyroid labs repeated, citing concern for possible Hashimoto's Encephalopathy causing patient's persistent mental status changes.  Advised acute setting and recent illnesses often make TSH and T4 abnormal and not very useful clinically. TSH on 10/06/23 was normal at 3.002 >> now 49.663 Free T4 on 10/06/23 minimally elevated at 1.24 >> now low at 0.38 --Continue Synthroid 150 mcg daily --Repeat TSH and free T4 in 6-8 weeks with PCP --Negative anti-TPO Ab (obtained at daughter's request as she has hashimoto's)     Physical debility : PT and OT recommend discharge to SNF. Placed  purewick for urination       Subjective: Pt denies any new symptoms. Mild shortness of breath Denies fever, chest pain, headache, dizziness, nausea/vomiting, constipation  Physical Exam: Vitals:   11/19/23 2020 11/19/23 2038 11/20/23 0450 11/20/23 0908  BP:  (!) 135/96 105/71 120/78  Pulse:  (!) 104 (!) 103 (!) 101  Resp:  20 20 17   Temp:  (!) 97 F (36.1 C) 97.8 F (36.6 C) (!) 97.5 F (36.4 C)  TempSrc:   Oral   SpO2: 97% 100% (!) 89% 93%  Weight:    66.7 kg  Height:       Physical Exam Constitutional:      General: She is not in acute distress. HENT:     Head: Normocephalic and atraumatic.     Nose: Nose normal. No congestion.     Mouth/Throat:     Mouth: Mucous membranes are moist.     Pharynx: Oropharynx is clear. No oropharyngeal exudate.  Eyes:     Extraocular Movements: Extraocular movements intact.     Conjunctiva/sclera: Conjunctivae normal.  Cardiovascular:     Rate and Rhythm: Normal rate and regular rhythm.  Pulmonary:     Effort: Pulmonary effort is normal.     Breath sounds: Normal breath sounds.  Abdominal:     General: Abdomen is flat. Bowel sounds are normal.     Palpations: Abdomen is soft.  Musculoskeletal:        General: No swelling or tenderness. Normal range of motion.     Cervical back: Normal range of motion.     Comments: Has mild-moderate bony tenderness in both legs:  Skin:    General: Skin is warm.     Capillary Refill: Capillary refill takes 2 to 3 seconds.     Findings: No rash.  Neurological:     Mental Status: Mental status is at baseline.     Motor: No weakness.  Psychiatric:        Mood and Affect: Mood normal.     Data Reviewed:   Latest Reference Range & Units 11/20/23 13:04  Sodium 135 - 145 mmol/L 140  Potassium 3.5 - 5.1 mmol/L 3.5  Chloride 98 - 111 mmol/L 99  CO2 22 - 32 mmol/L 29  Glucose 70 - 99 mg/dL 540 (H)  BUN 8 - 23 mg/dL 17  Creatinine 9.81 - 1.91 mg/dL 4.78  Calcium 8.9 - 29.5 mg/dL 9.1  Anion gap  5 - 15  12  Phosphorus 2.5 - 4.6 mg/dL 3.5  Magnesium 1.7 - 2.4 mg/dL 2.1  GFR, Estimated >62 mL/min >60  WBC 4.0 - 10.5 K/uL 4.9  RBC 3.87 - 5.11 MIL/uL 3.51 (L)  Hemoglobin 12.0 - 15.0 g/dL 13.0 (L)  HCT 86.5 - 78.4 % 32.0 (L)  MCV 80.0 - 100.0 fL 91.2  MCH 26.0 - 34.0 pg 29.6  MCHC 30.0 - 36.0 g/dL 69.6  RDW 29.5 - 28.4 % 16.3 (H)  Platelets 150 - 400 K/uL 248  nRBC 0.0 - 0.2 % 0.0  (H): Data is abnormally high (L): Data is abnormally low  Family Communication:   Disposition: Status is: Inpatient   Planned Discharge Destination: Skilled nursing facility    Time spent: 35 minutes  Author: Ernestene Mention, MD 11/20/2023 2:05 PM  For on call review [email or web address].

## 2023-11-20 NOTE — Progress Notes (Signed)
Daily Progress Note   Patient Name: Michele House       Date: 11/20/2023 DOB: [birth year]  Age: 68 y.o. MRN#: [id number] Attending Physician: Ernestene Mention, MD Primary Care Physician: [facility] Admit Date: 10/02/2023  Reason for Consultation/Follow-up: Establishing goals of care  HPI/Brief Hospital Review: 68 y.o. female  with past medical history of COPD with chronic respiratory failure on 2L [place] at baseline, T2DM, HFpEF and hypertension admitted on 10/02/2023 with AMS.   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   Imaging also revealed right upper lode lung nodule   Developed respiratory distress on 12/23--found to have metapneumovirus, stabilized but unfortunately developed worsening respiratory distress and required mechanical ventilation S/p bronchoscopy 12/27 biopsy of lung nodule performed-very rare atypical cells and mostly reactive alveolar cells, concern for likely primary lung cancer   Course further complicated by acute anemia and development of shock S/p EGD oozing duodenal ulcer-clipped 12/26 S/p IR embolization to duodenal artery 12/28    Self extubated 12/27 soon after required reintubation   Successfully extubated 12/30, transferred out of ICU 1/2   Unfortunately has since been complicated by finding of acute DVT requiring IVC filter placement, potential seizure activity (history of seizure disorder but last seizure about 30 years ago) and underwent bronchoscopy with biopsy on 1/10  1/14 biopsy resulted for non-small cell lung cancer, oncology to follow as outpatient  Authorization  for SNF has been initiated   Palliative medicine was consulted for assisting with goals of care conversations.   Subjective: Extensive chart review has been completed prior to meeting patient including labs, vital signs, imaging, progress notes, orders, and available advanced directive documents from current and previous encounters.    Visited with Michele House at her bedside. She is awake, alert, able to engage in conversation. She denies acute complaints of pain or discomfort. She is looking forward to discharge.  Called and spoke with daughter-Michele House. Provided medical updates. Michele House shares referral for oncology has already been placed and anticipate the first visit once Michele House able to discharge from SNF. Michele House is also aware of SNF authorization being started.  Answered and addressed all questions and concerns. No ongoing acute palliative  needs at this time. Encouraged Ms. [name] and Michele House to reach out to PMT services for any needs as they arise. Will check back in next weekend if still admitted.  Thank you for allowing the Palliative Medicine Team to assist in the care of this patient.  Total time:  25 minutes  Time spent includes: Detailed review of medical records (labs, imaging, vital signs), medically appropriate exam (mental status, respiratory, cardiac, skin), discussed with treatment team, counseling and educating patient, family and staff, documenting clinical information, medication management and coordination of care.  Michele Deed, DNP, AGNP-C Palliative Medicine   Please contact Palliative Medicine Team phone at [phone] for questions and concerns.

## 2023-11-21 DIAGNOSIS — G9341 Metabolic encephalopathy: Secondary | ICD-10-CM | POA: Diagnosis not present

## 2023-11-21 LAB — GLUCOSE, CAPILLARY
Glucose-Capillary: 195 mg/dL — ABNORMAL HIGH (ref 70–99)
Glucose-Capillary: 148 mg/dL — ABNORMAL HIGH (ref 70–99)
Glucose-Capillary: 148 mg/dL — ABNORMAL HIGH (ref 70–99)
Glucose-Capillary: 242 mg/dL — ABNORMAL HIGH (ref 70–99)

## 2023-11-21 NOTE — Progress Notes (Signed)
Progress Note   Patient: Michele House ZOX:[id number] DOB: [birth year] DOA: 10/02/2023     50 DOS: the patient was seen and examined on 11/21/2023   Brief hospital course: "Michele House is a 68 y.o. female with medical history significant of COPD, chronic respiratory failure on 2 L, hypertension, type 2 diabetes, HFpEF, recent discharge from the hospital 24th November 2024 after hospitalization for COPD exacerbation complicated by respiratory failure.  She presented to the hospital again on 10/02/2023 with chest pain, malaise, cough, shortness of breath, abdominal pain and dysuria.   She was admitted to the hospital for COPD exacerbation, acute on chronic hypoxemic respiratory failure, UTI and pneumonia.  She later developed worsening confusions, MRI brain revealed multiple acute strokes.   Prolonged hospital admission complicated by several issues including hemorrhagic shock requiring transfer to ICU for pressors and GI consultation. EGD showed duodenal ulcer/s that were treated with clipped but later required embolization with IR.  Pt also found with RLE DVT status post IVC filter placement given bleeding anticoagulation contraindicated.   For detailed hospital course and significant events, see TRH progress note by Dr. Myriam Forehand of 11/09/23 and ICU notes from earlier admission.   Further hospital course and management as outlined below.   Assessment and Plan:   Acute metabolic encephalopathy: Improved. Completed course of high-dose IV Solu-Medrol on 10/12/2023 per Neurology Differential diagnosis included autoimmune encephalitis --Delirium precuations --Avoid benzo's & sedating meds   Acute stroke with right hemiplegia: Off of Plavix because of recent acute GI bleeding.   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   New Onset Seizure-like Activity - evening of 11/10/23 - see RN and cross coverage notes --Neurology gave their recommendations --Continue Keppra 500 mg BID --EEG with moderate diffuse encephalopathy --Seizure precautions --PRN Ativan if seizure activity   Acute GI bleeding, bleeding duodenal ulcer:  S/p EGD on 10/28/2023 with clip placed.   S/p IR embolization on 10/30/2023.   --Continue Protonix. --Monitor CBC --Transfuse if Hbg < 7 or less than 8 with active bleeding or hemodynamic instability   Lower extremity pain, more severe in the left, right peroneal DVT: Patient complaining of pain in the lower extremities 1/7.  Initially refused ultrasound of the left lower extremity because of severe pain.  Ultrasound of the right lower extremity showed right calf DVT.  LLE U/S obtained 1/8 after pt agreed - no LLE DVT was seen. Due to high risk of recurrent GI bleeding given recent massive GI bleed requiring embolization by IR, unable to treat with anticoagulation. Vascular surgery placed IVC filter on 11/09/23 Has mild-moderate bony tenderness in both legs: Obtain Vitamin D level   Hemorrhagic shock: Resolved Hypotension: Resolved.  Off of vasopressors   Acute blood loss anemia: H&H is stable.  S/p transfusion with 2 units of PRBCs on 10/28/2023, 1 unit of PRBCs on 10/29/2023.  S/p transfusion with 2 units of platelet pheresis on 10/29/2023. H&H is stable.   Pneumonia: Initially completed 5 days of IV ceftriaxone and azithromycin on 10/07/2023. She was restarted on broad-spectrum antibiotics (vancomycin, ceftriaxone and Flagyl) on 10/28/2023 when she decompensated requiring transfer to the ICU. Acute UTI: Urine culture showed strep agalactiae.  Completed antibiotics.   Acute on chronic diastolic CHF: Stable.   S/p diuresis with IV Lasix.  Continue oral Lasix 20 mg daily Monitor volume status  COPD exacerbation: This had previously resolved, but required  additional course of steroids. Pulmonology following Completed 2nd course w/ prednisone taper  Now stable, monitor closely   Acute on chronic hypoxemic respiratory failure, chronic hypercapnic respiratory failure: She was intubated on 10/28/2023 and extubated on 11/01/2023. Currently she is requiring 2 L/min oxygen via nasal cannula. Continues to desat to low/mid 80's when tried on room air. --Supplement O2, target sats > 88% --Bipap nightly   Type II DM with severe hyperglycemia CBG's remain elevated despite insulin increased yetetrday --Appreciate diabetes coordinator input. --Increase Semglee to 18 units at bedtime --Continue Novolog 4units TID WC, hold if < 50% meal consumed --Sliding scale Novolog 0-9 AC / 0-5 HS   Anxiety/panic attacks: improved.   Avoid benzo's due to waxing and waning mental status.   On Zoloft   Non-small cell carcinoma -- pt found to have 1.3 cm right upper lung spiculated nodule: S/p bronchoscopy on 10/20/2023.  Pathology showed rare atypical cells, but unclear if malignancy and findings more consistent with reactive bronchial cells with acute inflammation. Lung biopsy repeated 1/10, results today per Dr. Alena Bills note. --Follow up with Oncology outpatient --Outpatient PET scan recommended   Hypothyroidism -- pt has been on her Synthroid 100 mcg daily.  Daughter requested 1/11 to have thyroid labs repeated, citing concern for possible Hashimoto's Encephalopathy causing patient's persistent mental status changes.  Advised acute setting and recent illnesses often make TSH and T4 abnormal and not very useful clinically. TSH on 10/06/23 was normal at 3.002 >> now 49.663 Free T4 on 10/06/23 minimally elevated at 1.24 >> now low at 0.38 --Continue Synthroid 150 mcg daily --Repeat TSH and free T4 in 6-8 weeks with PCP --Negative anti-TPO Ab (obtained at daughter's request as she has hashimoto's)     Physical debility : PT and OT recommend discharge to SNF.         Subjective: Pt denies any new symptoms. Mild shortness of breath Denies fever, chest pain, headache, dizziness, nausea/vomiting, constipation  Physical Exam: Vitals:   11/20/23 2030 11/21/23 0410 11/21/23 0500 11/21/23 1020  BP: 130/72 124/66  124/68  Pulse: 100 86  91  Resp: 20 18  19   Temp: 98 F (36.7 C) 97.8 F (36.6 C)  (!) 97.4 F (36.3 C)  TempSrc: Oral Oral  Oral  SpO2: 92% 90%  92%  Weight:   64.9 kg   Height:       Physical Exam Constitutional:      General: She is not in acute distress. HENT:     Head: Normocephalic and atraumatic.     Nose: Nose normal. No congestion.     Mouth/Throat:     Mouth: Mucous membranes are moist.     Pharynx: Oropharynx is clear. No oropharyngeal exudate.  Eyes:     Extraocular Movements: Extraocular movements intact.     Conjunctiva/sclera: Conjunctivae normal.  Cardiovascular:     Rate and Rhythm: Normal rate and regular rhythm.  Pulmonary:     Effort: Pulmonary effort is normal.     Breath sounds: Normal breath sounds.  Abdominal:     General: Abdomen is flat. Bowel sounds are normal.     Palpations: Abdomen is soft.  Musculoskeletal:        General: No swelling or tenderness. Normal range of motion.     Cervical back: Normal range of motion.     Comments: Has mild-moderate bony tenderness in both legs:  Skin:    General: Skin is warm.  Capillary Refill: Capillary refill takes 2 to 3 seconds.     Findings: No rash.  Neurological:     Mental Status: Mental status is at baseline.     Motor: No weakness.  Psychiatric:        Mood and Affect: Mood normal.     Data Reviewed:   Latest Reference Range & Units 11/20/23 13:04  Sodium 135 - 145 mmol/L 140  Potassium 3.5 - 5.1 mmol/L 3.5  Chloride 98 - 111 mmol/L 99  CO2 22 - 32 mmol/L 29  Glucose 70 - 99 mg/dL 147 (H)  BUN 8 - 23 mg/dL 17  Creatinine 8.29 - 5.62 mg/dL 1.30  Calcium 8.9 - 86.5 mg/dL 9.1  Anion gap 5 - 15  12  Phosphorus 2.5 - 4.6 mg/dL 3.5   Magnesium 1.7 - 2.4 mg/dL 2.1  GFR, Estimated >78 mL/min >60  WBC 4.0 - 10.5 K/uL 4.9  RBC 3.87 - 5.11 MIL/uL 3.51 (L)  Hemoglobin 12.0 - 15.0 g/dL 46.9 (L)  HCT 62.9 - 52.8 % 32.0 (L)  MCV 80.0 - 100.0 fL 91.2  MCH 26.0 - 34.0 pg 29.6  MCHC 30.0 - 36.0 g/dL 41.3  RDW 24.4 - 01.0 % 16.3 (H)  Platelets 150 - 400 K/uL 248  nRBC 0.0 - 0.2 % 0.0  (H): Data is abnormally high (L): Data is abnormally low  Family Communication:   Disposition: Status is: Inpatient   Planned Discharge Destination: Awaiting for Skilled nursing facility    Time spent: 35 minutes  Author: Ernestene Mention, MD 11/21/2023 1:54 PM  For on call review [email or web address].

## 2023-11-21 NOTE — Progress Notes (Signed)
NAME:  Michele House, MRN:  [id number], DOB:  1956/04/05, LOS: 50 ADMISSION DATE:  10/02/2023   History of Present Illness:  Case of a 68 year old female patient with a past medical history of COPD with chronic respiratory failure on 2 L nasal cannula, hypertension, type 2 diabetes, heart failure with preserved EF who has been here for a month.  Initially presented with altered mental status encephalopathy.  Had an extensive workup including brain MRI which was negative for any acute intracranial abnormality initially.  However unfortunately course complicated by acute CVA with repeat MRI showing multiple acute infarcts in the high bilateral posterior frontal and parietal lobes.  She recovered from that but course further complicated by waxing and waning mental status which prompted an LP to rule out encephalitis and has been unremarkable so far.  She was found to have right upper lobe lung nodule which was concerning for malignancy and underwent robotic assisted navigational bronchoscopy with biopsy on 12/27 which showed very rare atypical cells and reactive alveolar cells mostly.    Pertinent  Medical History  As abnove  Significant Hospital Events: Including procedures, antibiotic start and stop dates in addition to other pertinent events   12/01: Vital stable, respiratory viral panel negative, procalcitonin negative, preliminary blood cultures negative.  Urine cultures are ordered as add-on, ammonia levels mildly elevated at 39, strep pneumo negative, CBG elevated at 244, history of enlarging pulmonary nodule with recommendations for PET/CT during most recent admission which has not been done yet, if video bronchoscopy was scheduled for 10/13/2023 as outpatient. 12/02: Unable to take care of herself and son cannot provide the required care as she required 24-hour supervision due to worsening dementia.  PT is recommending SNF. 12/03: Blood pressure started trending up, restarting home  Zestoretic.  Urine cultures with strep agalactiae, penicillin allergy noted, will continue with ceftriaxone for now.  Also started on Remeron for concern of worsening depression and unable to sleep at night  12/05:  Patient has some blurry vision and weakness and pain.  MRI of the brain negative for acute intracranial abnormality. 12/08:  Patient more confused today than yesterday.  Able to follow some simple commands but not as good of a conversation today as yesterday. 12/09:  Patient still confused today.  Does not follow simple commands today. 12/10: Patient more talkative today but was looking up at the ceiling when she was talking with me.  She does not move her extremities to command but does move them on her own.  Physical therapy and Occupational Therapy did not see her move her right arm. 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  Mild associated petechial hemorrhage.  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 12/12: Patient with new stroke, CTA head and neck with no large vessel occlusion.  Patient had a recent echocardiogram which was normal. Concern of paraneoplastic versus hypercoagulability secondary to malignancy.  Patient missed her appointment for bronchoscopy and outpatient appointment for PET scan due to recurrent hospitalizations. 12/13: Patient continued to have waxing and waning mental status, unable to move right upper and lower extremities.  Having some hallucination.  Pulmonary is planning lung biopsy on 12/27 if she remains stable.  Rapidly progressive dementia and now with underlying stroke, question of paraneoplastic encephalitis and hypercoagulability secondary to underlying undiagnosed malignancy. 12/15: Patient with much  improved mentation.  Having some flickering of movements on right  upper and lower extremity today, left extremities weaker but seems improving.   12/18: Pt underwent bronchoscopy and lung biopsy 12/19: patient stable , she is being optimized for dc for rehab.  She's cleared from pulmonary for discharge. Results from bronch will take 1 week.   12/26: Pt transferred to the stepdown unit with acute hypercapnic respiratory requiring Bipap but subsequently required mechanical intubation due to worsening respiratory failure.  Insulin gtt initiated due to severe hyperglycemia.  With hemorrhagic shock due to bleeding duodenal ulcer, underwent emergent EGD with successful hemostasis by injection of Epinephrine and hemostatic clip. 12/27: Hgb slowly trending down, no report of bleeding from OG suction nor melena. Weaning down vasopressors, if able to continue to wean vasopressors, then can consider WUA/SBT.  Giving temporizing measures for Hyperkalemia. Self extubated but required reintubation. Hgb continued to trend down, given 1 unit pRBC's and 2 FFP.  IR consulted for consideration for embolization. 12/28: Weaned off Vasopressin, Levophed weaning down.  S/p IR embolization of the Duodenal artery. Peak pressures and lung sounds improved with Ketamine, will keep intubated today given rapid reintubation yesterday. 12/30- patient on PRVC FiO2 28%, weaning off ketamine and hoping to perform SBT today.  Holding feeds until post SBT.  11/02/23- patient is stable overnight no acute events, electrllytes improved.  No GI bleeds noted.  Opitimizing for TRH today.  11/03/23 patient is more interactive this am, still has not been able to get OOB.  Will continue to follow her from pulmonary with COPD and lung cancer. Her pathology was sent for second opinion due to mixed reporting from cytotech and pathology report.  11/03/22- patient is good spirits this am. Son came to visit yesterday and we reivewed medical plan. H/h is stable. Following peripherally from pulmonary perspective for COPD and  lung mass.  11/05/23- patient on 3L/min.  She still has 9 day old Right internal jugular central line.  I have placed orders for RN to remove this line today.  She required BIPAP yesterday but states she did not feel SOB>  11/06/23- patient shares she feels well today.  She is on 3L/min but takes it off at times. She reports breathing feels improved 11/07/23- patient with no acute events overnight.  H/h is improved 11/08/23- patient seen at bedside.  Sitting up in bed in no distress denies dyspnea.  She is on nasal canula 2L/min.  She asks if we can help her get glasses and dentures.  I received inconclusive results from lung biopsy. I discussed this with patient.  We discussed repeating the biopsy and patient states she wants this done as soon as possible. She is clinically stable.  Diagnosis Lung, biopsy, Right upper lobe - VERY RARE ATYPICAL CELLS, FAVOR REACTIVE. - FRAGMENTS OF ALVEOLATED LUNG AND BLOOD CLOT. Diagnosis Note Per [facility] recent imaging revealed an enlarging spiculated 1.6 cm right upper lung lesion. This case underwent intradepartmental consultation and Dr. Venetia Night concurs with the interpretation. See concurrent case [id number]. Delice Bison  11/09/23- patient with acute dvt.  Not good candidate for Antcoagulation per GI due to upper GI bleed with duod ulcer 11/10/23- plant for lung biopsy on 11/12/23- reviewed with patient.  NPO Thursday night at midnight  11/11/23- I evaluated patient at bedside, she reports no acute events.  We discussed lung biopsy she is agreeable and wishes to proceed.  She asked if I can help her get glasses and new dentures and I did discuss this with  her son 08/11/24- patient evaluated prior to procedure , on room air saturating 92%. No questions regarding about procedure 11/13/23-patient is on 2L/min [place].  She appears to be in good spirits asking to be discharged.  She does report partial weakness on left side post CVA but on examination from me does not seem to have FND.   She has PT/OT ordered.  She can likely be weaned from oxygen prior to dc home. Her pathology results from lung biopsy are in process.  11/15/23- patient seen at bedside , stable on 3L.  She is being optimized for dc to SNF 11/16/23- patient in bed reports feeling abd pain and dizziness.  She is now on 1.5L/min [place].  Her biopsy results came back as NSCLC and we have contacted oncology.  Ive met with son and reviewed findings.  11/17/23- patient sitting up in bed in NAD.  She is eating lung during my evaluation.  She reports fatigue and is working with PT/OT to be discharged to SNF.  11/19/23- patient seen at bedside with son present.  Patient shares she's feeling better in no pain asking to go home. 11/21/23- patient seen at bedside.  She reports anxiety.  She reports weakness in neck hindering her ability to get up.  She asked for assistance with nourishement.  She reports breathing is stable with no chest pain. I discussed with her RN regarding needing help to eat.     Objective   Blood pressure 124/68, pulse 91, temperature (!) 97.4 F (36.3 C), temperature source Oral, resp. rate 19, height 5' (1.524 m), weight 64.9 kg, last menstrual period 12/24/1998, SpO2 92%.       No intake or output data in the 24 hours ending 11/21/23 1712   Filed Weights   11/19/23 0500 11/20/23 0908 11/21/23 0500  Weight: 65.3 kg 66.7 kg 64.9 kg    Examination: General: Awake, follows verbal communication HENT: Supple neck reactive pupils  Lungs: mild wheezing b/l Cardiovascular: Normal S1, Normal S2, RRR  Abdomen: Soft, non tender, non distended, +BS  Extremities: Warm and well perfused no edema.   Labs and imaging were reviewed.   Assessment & Plan:    #1Acute hypoxic respiratory failure - RESOLVED  #2COPD/asthma exacerbation in the setting of metapneumovirus - IMPROVED  #3Acute anemia with hemorrhagic shock secondary to Duodenal ulcer s.p EGD clip and Injection 10/28/2023, s/p IR Embolization on  10/30/2023- S/P SLP WITH NOW HAVING NORMAL BM #4Metabolic encephalopathy- RESOLVED  #5Acute CVA during this admission with right sided hemiparesis- ONGOING PT #6AKI - RESOLVED  #7 - 16mm right upper lobe nodule - reported as lesional carcinoma by ROSE but pathology showed rare atypical cells.  This has been sent for second opinion to be reviewed.  I think its most likely primary lung cancer.  S/p lung biopsy- with NSCLS - reported to Dr Alena Bills oncology   Best Practice (right click and "Reselect all SmartList Selections" daily)   Diet/type: NPO DVT prophylaxis SCD Pressure ulcer(s): N/A GI prophylaxis: PPI Lines: Central line Foley:  Yes, and it is still needed Code Status:  full code     Vida Rigger, M.D.  Pulmonary & Critical Care Medicine

## 2023-11-21 NOTE — Plan of Care (Signed)
  Problem: Health Behavior/Discharge Planning: Goal: Ability to manage health-related needs will improve Outcome: Progressing   Problem: Health Behavior/Discharge Planning: Goal: Ability to manage health-related needs will improve Outcome: Progressing   

## 2023-11-22 DIAGNOSIS — Z515 Encounter for palliative care: Secondary | ICD-10-CM | POA: Diagnosis not present

## 2023-11-22 DIAGNOSIS — R911 Solitary pulmonary nodule: Secondary | ICD-10-CM | POA: Diagnosis not present

## 2023-11-22 DIAGNOSIS — G9341 Metabolic encephalopathy: Secondary | ICD-10-CM | POA: Diagnosis not present

## 2023-11-22 DIAGNOSIS — J441 Chronic obstructive pulmonary disease with (acute) exacerbation: Secondary | ICD-10-CM | POA: Diagnosis not present

## 2023-11-22 LAB — GLUCOSE, CAPILLARY
Glucose-Capillary: 111 mg/dL — ABNORMAL HIGH (ref 70–99)
Glucose-Capillary: 171 mg/dL — ABNORMAL HIGH (ref 70–99)
Glucose-Capillary: 212 mg/dL — ABNORMAL HIGH (ref 70–99)
Glucose-Capillary: 224 mg/dL — ABNORMAL HIGH (ref 70–99)
Glucose-Capillary: 254 mg/dL — ABNORMAL HIGH (ref 70–99)
Glucose-Capillary: 62 mg/dL — ABNORMAL LOW (ref 70–99)

## 2023-11-22 MED ORDER — METHOCARBAMOL 500 MG PO TABS
500.0000 mg | ORAL_TABLET | Freq: Once | ORAL | Status: AC
  Administered 2023-11-22: 500 mg via ORAL
  Filled 2023-11-22: qty 1

## 2023-11-22 NOTE — Progress Notes (Signed)
Physical Therapy Treatment Patient Details Name: Michele House MRN: [id number] DOB: June 01, 1956 Today's Date: 11/22/2023   History of Present Illness Patient is a 68 year old with prolonged hospital stay, originally admitted with AMS. 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 She developed respiratory distress requiring mechanical ventilation. Self extubated 12/27 with re-intubation and extubation on 12/30. Pt also found with RLE DVT status post IVC filter placement given bleeding anticoagulation contraindicated. Seizure on 1/8 with transfer to 2nd floor.    PT Comments  Pt excited for therapy wanting to get up to have BM.  She is assisted to sitting with max a x 1.  Spent time focusing on sitting balance where she is able to hold herself very briefly before slouching right and post.  She is able to stand x 2 with max a x 2.  On first attempt knees quickly buckle and she is assisted to bed.  On second she is able to stand fully and bear her weight for a few moments before needing to sit.  She is assisted back to bed and care is performed with tech as she is noted to have already had BM.  Pt is appropriate for [facility] lift at this time for OOB with nursing.  She overall has poor awareness of R side of body and general limitations.  Educated pt and son about some exercises they could do on their own to help strengthen neck and trunk as pt c/o neck weakness and does have difficulty holding her head up for extended periods of time unsupported.     If plan is discharge home, recommend the following: Two people to help with walking and/or transfers;Two people to help with bathing/dressing/bathroom;Assistance with cooking/housework;Direct supervision/assist for medications management;Assistance with feeding;Direct supervision/assist for financial management;Assist for transportation;Help with stairs or ramp for entrance;Supervision due to cognitive status   Can travel by  private vehicle        Equipment Recommendations       Recommendations for Other Services       Precautions / Restrictions Precautions Precautions: Fall Precaution Comments: right side hemiparesis Restrictions Weight Bearing Restrictions Per Provider Order: No     Mobility  Bed Mobility Overal bed mobility: Needs Assistance Bed Mobility: Supine to Sit, Sit to Supine Rolling: Max assist   Supine to sit: Max assist       Patient Response: Cooperative  Transfers Overall transfer level: Needs assistance Equipment used: 2 person hand held assist Transfers: Sit to/from Stand Sit to Stand: Max assist, +2 physical assistance           General transfer comment: stood x 2 with knees buckling on 1 st attempt    Ambulation/Gait               General Gait Details: unable/unsafe   Stairs             Wheelchair Mobility     Tilt Bed Tilt Bed Patient Response: Cooperative  Modified Rankin (Stroke Patients Only)       Balance Overall balance assessment: Needs assistance Sitting-balance support: Bilateral upper extremity supported, No upper extremity supported, Feet supported Sitting balance-Leahy Scale: Zero Sitting balance - Comments: able to hold self very briefly but quickly leans right/back   Standing balance support: Bilateral upper extremity supported Standing balance-Leahy Scale: Zero Standing balance comment: only able to stand briely.  Cognition Arousal: Alert Behavior During Therapy: Impulsive, WFL for tasks assessed/performed Overall Cognitive Status: Within Functional Limits for tasks assessed                                          Exercises Other Exercises Other Exercises: rolling left/right for care with max a and hand over hand cues for hand placements    General Comments        Pertinent Vitals/Pain Pain Assessment Pain Assessment: No/denies pain Pain  Intervention(s): Monitored during session    Home Living                          Prior Function            PT Goals (current goals can now be found in the care plan section) Progress towards PT goals: Progressing toward goals    Frequency    Min 1X/week      PT Plan      Co-evaluation              AM-PAC PT "6 Clicks" Mobility   Outcome Measure  Help needed turning from your back to your side while in a flat bed without using bedrails?: Total Help needed moving from lying on your back to sitting on the side of a flat bed without using bedrails?: A Lot Help needed moving to and from a bed to a chair (including a wheelchair)?: Total Help needed standing up from a chair using your arms (e.g., wheelchair or bedside chair)?: Total Help needed to walk in hospital room?: Total Help needed climbing 3-5 steps with a railing? : Total 6 Click Score: 7    End of Session Equipment Utilized During Treatment: Gait belt Activity Tolerance: Patient tolerated treatment well;Patient limited by fatigue Patient left: in bed;with call bell/phone within reach;with bed alarm set;with family/visitor present Nurse Communication: Mobility status;Precautions PT Visit Diagnosis: Unsteadiness on feet (R26.81);Other abnormalities of gait and mobility (R26.89);Muscle weakness (generalized) (M62.81);Difficulty in walking, not elsewhere classified (R26.2);Hemiplegia and hemiparesis Hemiplegia - Right/Left: Right Hemiplegia - dominant/non-dominant: Dominant Hemiplegia - caused by: Cerebral infarction     Time: 3875-6433 PT Time Calculation (min) (ACUTE ONLY): 21 min  Charges:    $Therapeutic Activity: 8-22 mins PT General Charges $$ ACUTE PT VISIT: 1 Visit                   Danielle Dess, PTA 11/22/23, 1:54 PM

## 2023-11-22 NOTE — Progress Notes (Signed)
Progress Note   Patient: Michele House UXN:[id number] DOB: [birth year] DOA: 10/02/2023     51 DOS: the patient was seen and examined on 11/22/2023   Brief hospital course: "Michele House is a 68 y.o. female with medical history significant of COPD, chronic respiratory failure on 2 L, hypertension, type 2 diabetes, HFpEF, recent discharge from the hospital 24th November 2024 after hospitalization for COPD exacerbation complicated by respiratory failure.  She presented to the hospital again on 10/02/2023 with chest pain, malaise, cough, shortness of breath, abdominal pain and dysuria.   She was admitted to the hospital for COPD exacerbation, acute on chronic hypoxemic respiratory failure, UTI and pneumonia.  She later developed worsening confusions, MRI brain revealed multiple acute strokes.   Prolonged hospital admission complicated by several issues including hemorrhagic shock requiring transfer to ICU for pressors and GI consultation. EGD showed duodenal ulcer/s that were treated with clipped but later required embolization with IR.  Pt also found with RLE DVT status post IVC filter placement given bleeding anticoagulation contraindicated.   For detailed hospital course and significant events, see TRH progress note by Dr. Myriam Forehand of 11/09/23 and ICU notes from earlier admission.   Further hospital course and management as outlined below.   Assessment and Plan:   Acute metabolic encephalopathy: Improved. Completed course of high-dose IV Solu-Medrol on 10/12/2023 per Neurology Differential diagnosis included autoimmune encephalitis --Delirium precuations --Avoid benzo's & sedating meds   Acute stroke with right hemiplegia: Off of Plavix because of recent acute GI bleeding.   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   New Onset Seizure-like Activity - evening of 11/10/23 - see RN and cross coverage notes --Neurology gave their recommendations --Continue Keppra 500 mg BID --EEG with moderate diffuse encephalopathy --Seizure precautions --PRN Ativan if seizure activity   Acute GI bleeding, bleeding duodenal ulcer:  S/p EGD on 10/28/2023 with clip placed.   S/p IR embolization on 10/30/2023.   --Continue Protonix. --Monitor CBC --Transfuse if Hbg < 7 or less than 8 with active bleeding or hemodynamic instability   Lower extremity pain, more severe in the left, right peroneal DVT: Patient complaining of pain in the lower extremities 1/7.  Initially refused ultrasound of the left lower extremity because of severe pain.  Ultrasound of the right lower extremity showed right calf DVT.  LLE U/S obtained 1/8 after pt agreed - no LLE DVT was seen. Due to high risk of recurrent GI bleeding given recent massive GI bleed requiring embolization by IR, unable to treat with anticoagulation. Vascular surgery placed IVC filter on 11/09/23 Has mild-moderate bony tenderness in both legs: Obtain Vitamin D level   Hemorrhagic shock: Resolved Hypotension: Resolved.  Off of vasopressors   Acute blood loss anemia: H&H is stable.  S/p transfusion with 2 units of PRBCs on 10/28/2023, 1 unit of PRBCs on 10/29/2023.  S/p transfusion with 2 units of platelet pheresis on 10/29/2023. H&H is stable.   Pneumonia: Initially completed 5 days of IV ceftriaxone and azithromycin on 10/07/2023. She was restarted on broad-spectrum antibiotics (vancomycin, ceftriaxone and Flagyl) on 10/28/2023 when she decompensated requiring transfer to the ICU. Acute UTI: Urine culture showed strep agalactiae.  Completed antibiotics.   Acute on chronic diastolic CHF: Stable.   S/p diuresis with IV Lasix.  Continue oral Lasix 20 mg daily Monitor volume status  COPD exacerbation: This had previously resolved, but required  additional course of steroids. Pulmonology following Completed 2nd course w/ prednisone taper  Now stable, monitor closely   Acute on chronic hypoxemic respiratory failure, chronic hypercapnic respiratory failure: She was intubated on 10/28/2023 and extubated on 11/01/2023. Currently she is requiring 2 L/min oxygen via nasal cannula. Continues to desat to low/mid 80's when tried on room air. --Supplement O2, target sats > 88% --Bipap nightly   Type II DM with severe hyperglycemia CBG's remain elevated despite insulin increased yetetrday --Appreciate diabetes coordinator input. --Increase Semglee to 18 units at bedtime --Continue Novolog 4units TID WC, hold if < 50% meal consumed --Sliding scale Novolog 0-9 AC / 0-5 HS   Anxiety/panic attacks: improved.   Avoid benzo's due to waxing and waning mental status.   On Zoloft   Non-small cell carcinoma -- pt found to have 1.3 cm right upper lung spiculated nodule: S/p bronchoscopy on 10/20/2023.  Pathology showed rare atypical cells, but unclear if malignancy and findings more consistent with reactive bronchial cells with acute inflammation. Lung biopsy repeated 1/10, results today per Dr. Alena Bills note. --Follow up with Oncology outpatient --Outpatient PET scan recommended   Hypothyroidism -- pt has been on her Synthroid 100 mcg daily.  Daughter requested 1/11 to have thyroid labs repeated, citing concern for possible Hashimoto's Encephalopathy causing patient's persistent mental status changes.  Advised acute setting and recent illnesses often make TSH and T4 abnormal and not very useful clinically. TSH on 10/06/23 was normal at 3.002 >> now 49.663 Free T4 on 10/06/23 minimally elevated at 1.24 >> now low at 0.38 --Continue Synthroid 150 mcg daily --Repeat TSH and free T4 in 6-8 weeks with PCP --Negative anti-TPO Ab (obtained at daughter's request as she has hashimoto's)     Physical debility : PT and OT recommend discharge to SNF.         Subjective: Pt denies any new symptoms. Mild shortness of breath Denies fever, chest pain, headache, dizziness, nausea/vomiting, constipation  Physical Exam: Vitals:   11/21/23 2100 11/22/23 0457 11/22/23 0500 11/22/23 0745  BP: 125/67 108/63  119/67  Pulse: (!) 110 (!) 101  (!) 104  Resp: 20 16  18   Temp: 98 F (36.7 C) 97.9 F (36.6 C)  97.6 F (36.4 C)  TempSrc: Oral Oral    SpO2: (!) 89% 93%  99%  Weight:   63.8 kg   Height:       Physical Exam Constitutional:      General: She is not in acute distress. HENT:     Head: Normocephalic and atraumatic.     Nose: Nose normal. No congestion.     Mouth/Throat:     Mouth: Mucous membranes are moist.     Pharynx: Oropharynx is clear. No oropharyngeal exudate.  Eyes:     Extraocular Movements: Extraocular movements intact.     Conjunctiva/sclera: Conjunctivae normal.  Cardiovascular:     Rate and Rhythm: Normal rate and regular rhythm.  Pulmonary:     Effort: Pulmonary effort is normal.     Breath sounds: Normal breath sounds.  Abdominal:     General: Abdomen is flat. Bowel sounds are normal.     Palpations: Abdomen is soft.  Musculoskeletal:        General: No swelling or tenderness. Normal range of motion.     Cervical back: Normal range of motion.     Comments: Has mild-moderate bony tenderness in both legs:  Skin:    General: Skin  is warm.     Capillary Refill: Capillary refill takes 2 to 3 seconds.     Findings: No rash.  Neurological:     Mental Status: Mental status is at baseline.     Motor: No weakness.  Psychiatric:        Mood and Affect: Mood normal.     Data Reviewed:   Latest Reference Range & Units 11/20/23 13:04  Sodium 135 - 145 mmol/L 140  Potassium 3.5 - 5.1 mmol/L 3.5  Chloride 98 - 111 mmol/L 99  CO2 22 - 32 mmol/L 29  Glucose 70 - 99 mg/dL 981 (H)  BUN 8 - 23 mg/dL 17  Creatinine 1.91 - 4.78 mg/dL 2.95  Calcium 8.9 - 62.1 mg/dL 9.1  Anion gap 5 - 15  12  Phosphorus 2.5 - 4.6  mg/dL 3.5  Magnesium 1.7 - 2.4 mg/dL 2.1  GFR, Estimated >30 mL/min >60  WBC 4.0 - 10.5 K/uL 4.9  RBC 3.87 - 5.11 MIL/uL 3.51 (L)  Hemoglobin 12.0 - 15.0 g/dL 86.5 (L)  HCT 78.4 - 69.6 % 32.0 (L)  MCV 80.0 - 100.0 fL 91.2  MCH 26.0 - 34.0 pg 29.6  MCHC 30.0 - 36.0 g/dL 29.5  RDW 28.4 - 13.2 % 16.3 (H)  Platelets 150 - 400 K/uL 248  nRBC 0.0 - 0.2 % 0.0  (H): Data is abnormally high (L): Data is abnormally low  Family Communication:   Disposition: Status is: Inpatient   Planned Discharge Destination: Awaiting for Authorization for Skilled nursing facility placement    Time spent: 25 minutes  Author: Ernestene Mention, MD 11/22/2023 1:46 PM  For on call review [email or web address].

## 2023-11-22 NOTE — TOC Progression Note (Signed)
Transition of Care [facility]) - Progression Note    Patient Details  Name: ELDINA SLISZ MRN: [id number] Date of Birth: 05/09/1956  [facility] of [facility]) CM/SW Contact  Allena Katz, LCSW Phone Number: 11/22/2023, 9:51 AM  Clinical Narrative:   Berkley Harvey waiting on MD review.    Expected Discharge Plan: Skilled Nursing Facility Barriers to Discharge: Continued Medical Work up  Expected Discharge Plan and Services         Expected Discharge Date: 11/09/23                                     Social Determinants of Health (SDOH) Interventions SDOH Screenings   Food Insecurity: Patient Unable To Answer (10/03/2023)  Recent Concern: Food Insecurity - Food Insecurity Present (09/24/2023)  Housing: High Risk (10/03/2023)  Transportation Needs: Patient Unable To Answer (10/03/2023)  Recent Concern: Transportation Needs - Unmet Transportation Needs (09/24/2023)  Utilities: Patient Unable To Answer (10/03/2023)  Social Connections: Patient Declined (11/02/2023)  Tobacco Use: Medium Risk (11/12/2023)    Readmission Risk Interventions     No data to display

## 2023-11-22 NOTE — Progress Notes (Signed)
   11/22/23 1045  Spiritual Encounters  Type of Visit Initial  Care provided to: Patient  Conversation partners present during encounter Nurse  Referral source Nurse (RN/NT/LPN)  Reason for visit Grief/loss  OnCall Visit No  Spiritual Framework  Presenting Themes Significant life change;Impactful experiences and emotions;Community and relationships  Community/Connection Family  Patient Stress Factors Health changes  Family Stress Factors None identified   Chaplain visited patient per Nurse recommendation.  Chaplain provided empathy and active listening concerning patient's frustration about issues related to recent diagnosis.  Chaplain advocated for patient by sharing concerns with the Nurse.  Chaplain will follow-up as needed/requested by patient or staff.   Rev. Rana M. Earlene Plater, MDiv. Chaplain Resident  [facility]

## 2023-11-22 NOTE — Progress Notes (Signed)
Occupational Therapy Treatment Patient Details Name: CINAMON WESTENDORF MRN: [id number] DOB: 16-Mar-1956 Today's Date: 11/22/2023   History of present illness Patient is a 68 year old with prolonged hospital stay, originally admitted with AMS. 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 She developed respiratory distress requiring mechanical ventilation. Self extubated 12/27 with re-intubation and extubation on 12/30. Pt also found with RLE DVT status post IVC filter placement given bleeding anticoagulation contraindicated. Seizure on 1/8 with transfer to 2nd floor.   OT comments  Pt. reports 10/10 abdominal discomfort during the session. Nursing was notified. Nursing reports Pt. has had anxiety today. Pt. tolerated AAROM through all joint ranges of the RUE. Pt. tolerated facilitation of RUE to prepare for increased engagement of the RUE during self-grooming, and self-feeding tasks. Pt. required hand-over-hand assist to engage the right hand to assist the left in drinking from a cup. Pt. required hand-over-hand assist to engage the right hand in hand to face patterns for one step self-grooming. Pt. Required assist for task initiation. Pt. was distracted by abdominal discomfort. Pt. continues to benefit from OT services for ADL training, A/E training, UE neuromuscular re-education, and pt. education about home modification, and DME. OT discharge recommendations remain appropriate at this time.      If plan is discharge home, recommend the following:  Assistance with cooking/housework;Assist for transportation;Help with stairs or ramp for entrance;Direct supervision/assist for medications management;Supervision due to cognitive status;Direct supervision/assist for financial management;Two people to help with walking and/or transfers;Two people to help with bathing/dressing/bathroom   Equipment Recommendations       Recommendations for Other Services      Precautions /  Restrictions Precautions Precautions: Fall Precaution Comments: right side hemiparesis Restrictions Weight Bearing Restrictions Per Provider Order: No       Mobility Bed Mobility    Deferred                Transfers    Deferred                     Balance                                           ADL either performed or assessed with clinical judgement   ADL Overall ADL's : Needs assistance/impaired   Eating/Feeding Details (indicate cue type and reason): Hand-over-hand assist required to stabilize a cup in peparation for drinking from it. Grooming: Sitting;Wash/dry face;Maximal assistance;Total assistance;Brushing hair Grooming Details (indicate cue type and reason): Hand-over hand assist with the right for hand to fece patterns in preapration for light grooming.             Lower Body Dressing: Maximal assistance                      Extremity/Trunk Assessment Upper Extremity Assessment Upper Extremity Assessment: RUE deficits/detail RUE Deficits / Details: hemi weakness proximally through shoulder ranges, elbow, and grip weakness RUE Coordination: decreased fine motor;decreased gross motor            Vision       Perception     Praxis      Cognition Arousal: Alert Behavior During Therapy: Impulsive, WFL for tasks assessed/performed Overall Cognitive Status: Within Functional Limits for tasks assessed  Exercises      Shoulder Instructions       General Comments      Pertinent Vitals/ Pain       Pain Assessment Pain Assessment: 0-10 Pain Location: Abdominal discomfort Pain Descriptors / Indicators: Discomfort  Home Living                                          Prior Functioning/Environment              Frequency  Min 1X/week        Progress Toward Goals  OT Goals(current goals can now be found in the care  plan section)  Progress towards OT goals: Progressing toward goals  Acute Rehab OT Goals Patient Stated Goal: To return home OT Goal Formulation: With patient Time For Goal Achievement: 11/26/23 Potential to Achieve Goals: Fair  Plan      Co-evaluation                 AM-PAC OT "6 Clicks" Daily Activity     Outcome Measure   Help from another person eating meals?: A Lot Help from another person taking care of personal grooming?: A Lot Help from another person toileting, which includes using toliet, bedpan, or urinal?: Total Help from another person bathing (including washing, rinsing, drying)?: Total Help from another person to put on and taking off regular upper body clothing?: A Lot Help from another person to put on and taking off regular lower body clothing?: Total 6 Click Score: 9    End of Session    OT Visit Diagnosis: Muscle weakness (generalized) (M62.81);Hemiplegia and hemiparesis Hemiplegia - Right/Left: Right Hemiplegia - caused by: Cerebral infarction   Activity Tolerance Patient limited by pain   Patient Left in bed;with call bell/phone within reach;with bed alarm set   Nurse Communication          Time: 0981-1914 OT Time Calculation (min): 30 min  Charges: OT General Charges $OT Visit: 1 Visit OT Treatments $Self Care/Home Management : 23-37 mins  Olegario Messier, MS, OTR/L  Olegario Messier 11/22/2023, 4:42 PM

## 2023-11-22 NOTE — Plan of Care (Signed)
  Problem: Education: Goal: Ability to describe self-care measures that may prevent or decrease complications (Diabetes Survival Skills Education) will improve Outcome: Progressing   Problem: Coping: Goal: Ability to adjust to condition or change in health will improve Outcome: Progressing   Problem: Fluid Volume: Goal: Ability to maintain a balanced intake and output will improve Outcome: Progressing   Problem: Health Behavior/Discharge Planning: Goal: Ability to identify and utilize available resources and services will improve Outcome: Progressing Goal: Ability to manage health-related needs will improve Outcome: Progressing   Problem: Metabolic: Goal: Ability to maintain appropriate glucose levels will improve Outcome: Progressing   Problem: Nutritional: Goal: Maintenance of adequate nutrition will improve Outcome: Progressing Goal: Progress toward achieving an optimal weight will improve Outcome: Progressing   Problem: Skin Integrity: Goal: Risk for impaired skin integrity will decrease Outcome: Progressing   Problem: Tissue Perfusion: Goal: Adequacy of tissue perfusion will improve Outcome: Progressing   Problem: Education: Goal: Knowledge of General Education information will improve Description: Including pain rating scale, medication(s)/side effects and non-pharmacologic comfort measures Outcome: Progressing   Problem: Health Behavior/Discharge Planning: Goal: Ability to manage health-related needs will improve Outcome: Progressing   Problem: Clinical Measurements: Goal: Ability to maintain clinical measurements within normal limits will improve Outcome: Progressing Goal: Will remain free from infection Outcome: Progressing Goal: Diagnostic test results will improve Outcome: Progressing Goal: Respiratory complications will improve Outcome: Progressing Goal: Cardiovascular complication will be avoided Outcome: Progressing   Problem: Activity: Goal:  Risk for activity intolerance will decrease Outcome: Progressing   Problem: Nutrition: Goal: Adequate nutrition will be maintained Outcome: Progressing   Problem: Coping: Goal: Level of anxiety will decrease Outcome: Progressing   Problem: Elimination: Goal: Will not experience complications related to bowel motility Outcome: Progressing Goal: Will not experience complications related to urinary retention Outcome: Progressing   Problem: Pain Management: Goal: General experience of comfort will improve Outcome: Progressing   Problem: Safety: Goal: Ability to remain free from injury will improve Outcome: Progressing   Problem: Skin Integrity: Goal: Risk for impaired skin integrity will decrease Outcome: Progressing   Problem: Education: Goal: Knowledge of disease or condition will improve Outcome: Progressing Goal: Knowledge of the prescribed therapeutic regimen will improve Outcome: Progressing Goal: Individualized Educational Video(s) Outcome: Progressing   Problem: Activity: Goal: Ability to tolerate increased activity will improve Outcome: Progressing Goal: Will verbalize the importance of balancing activity with adequate rest periods Outcome: Progressing   Problem: Respiratory: Goal: Ability to maintain a clear airway will improve Outcome: Progressing Goal: Levels of oxygenation will improve Outcome: Progressing Goal: Ability to maintain adequate ventilation will improve Outcome: Progressing   Problem: Activity: Goal: Ability to tolerate increased activity will improve Outcome: Progressing   Problem: Clinical Measurements: Goal: Ability to maintain a body temperature in the normal range will improve Outcome: Progressing   Problem: Respiratory: Goal: Ability to maintain adequate ventilation will improve Outcome: Progressing Goal: Ability to maintain a clear airway will improve Outcome: Progressing   Problem: Education: Goal: Knowledge of disease or  condition will improve Outcome: Progressing Goal: Knowledge of secondary prevention will improve (MUST DOCUMENT ALL) Outcome: Progressing Goal: Knowledge of patient specific risk factors will improve Loraine Leriche N/A or DELETE if not current risk factor) Outcome: Progressing   Problem: Ischemic Stroke/TIA Tissue Perfusion: Goal: Complications of ischemic stroke/TIA will be minimized Outcome: Progressing

## 2023-11-22 NOTE — TOC Progression Note (Addendum)
Transition of Care [facility]) - Progression Note    Patient Details  Name: Michele House MRN: [id number] Date of Birth: 1956-01-26  [facility]) CM/SW Contact  Allena Katz, LCSW Phone Number: 11/22/2023, 1:56 PM  Clinical Narrative:   Pt rehab stay has been denied. Pt can appeal if wants fax [phone].   [employer] fast appeal number is [phone]   2:42pm  CSW went to bedside to discuss appeal but pt is out of room. Will follow up once back in room. Number left at bedside for appeal.   Expected Discharge Plan: Skilled Nursing Facility Barriers to Discharge: Continued Medical Work up  Expected Discharge Plan and Services         Expected Discharge Date: 11/09/23                                     Social Determinants of Health (SDOH) Interventions SDOH Screenings   Food Insecurity: Patient Unable To Answer (10/03/2023)  Recent Concern: Food Insecurity - Food Insecurity Present (09/24/2023)  Housing: High Risk (10/03/2023)  Transportation Needs: Patient Unable To Answer (10/03/2023)  Recent Concern: Transportation Needs - Unmet Transportation Needs (09/24/2023)  Utilities: Patient Unable To Answer (10/03/2023)  Social Connections: Patient Declined (11/02/2023)  Tobacco Use: Medium Risk (11/12/2023)    Readmission Risk Interventions     No data to display

## 2023-11-22 NOTE — Progress Notes (Signed)
NAME:  Michele House, MRN:  [id number], DOB:  08-Aug-1956, LOS: 51 ADMISSION DATE:  10/02/2023   History of Present Illness:  Case of a 68 year old female patient with a past medical history of COPD with chronic respiratory failure on 2 L nasal cannula, hypertension, type 2 diabetes, heart failure with preserved EF who has been here for a month.  Initially presented with altered mental status encephalopathy.  Had an extensive workup including brain MRI which was negative for any acute intracranial abnormality initially.  However unfortunately course complicated by acute CVA with repeat MRI showing multiple acute infarcts in the high bilateral posterior frontal and parietal lobes.  She recovered from that but course further complicated by waxing and waning mental status which prompted an LP to rule out encephalitis and has been unremarkable so far.  She was found to have right upper lobe lung nodule which was concerning for malignancy and underwent robotic assisted navigational bronchoscopy with biopsy on 12/27 which showed very rare atypical cells and reactive alveolar cells mostly.    Pertinent  Medical History  As abnove  Significant Hospital Events: Including procedures, antibiotic start and stop dates in addition to other pertinent events   12/01: Vital stable, respiratory viral panel negative, procalcitonin negative, preliminary blood cultures negative.  Urine cultures are ordered as add-on, ammonia levels mildly elevated at 39, strep pneumo negative, CBG elevated at 244, history of enlarging pulmonary nodule with recommendations for PET/CT during most recent admission which has not been done yet, if video bronchoscopy was scheduled for 10/13/2023 as outpatient. 12/02: Unable to take care of herself and son cannot provide the required care as she required 24-hour supervision due to worsening dementia.  PT is recommending SNF. 12/03: Blood pressure started trending up, restarting home  Zestoretic.  Urine cultures with strep agalactiae, penicillin allergy noted, will continue with ceftriaxone for now.  Also started on Remeron for concern of worsening depression and unable to sleep at night  12/05:  Patient has some blurry vision and weakness and pain.  MRI of the brain negative for acute intracranial abnormality. 12/08:  Patient more confused today than yesterday.  Able to follow some simple commands but not as good of a conversation today as yesterday. 12/09:  Patient still confused today.  Does not follow simple commands today. 12/10: Patient more talkative today but was looking up at the ceiling when she was talking with me.  She does not move her extremities to command but does move them on her own.  Physical therapy and Occupational Therapy did not see her move her right arm. 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  Mild associated petechial hemorrhage.  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 12/12: Patient with new stroke, CTA head and neck with no large vessel occlusion.  Patient had a recent echocardiogram which was normal. Concern of paraneoplastic versus hypercoagulability secondary to malignancy.  Patient missed her appointment for bronchoscopy and outpatient appointment for PET scan due to recurrent hospitalizations. 12/13: Patient continued to have waxing and waning mental status, unable to move right upper and lower extremities.  Having some hallucination.  Pulmonary is planning lung biopsy on 12/27 if she remains stable.  Rapidly progressive dementia and now with underlying stroke, question of paraneoplastic encephalitis and hypercoagulability secondary to underlying undiagnosed malignancy. 12/15: Patient with much  improved mentation.  Having some flickering of movements on right  upper and lower extremity today, left extremities weaker but seems improving.   12/18: Pt underwent bronchoscopy and lung biopsy 12/19: patient stable , she is being optimized for dc for rehab.  She's cleared from pulmonary for discharge. Results from bronch will take 1 week.   12/26: Pt transferred to the stepdown unit with acute hypercapnic respiratory requiring Bipap but subsequently required mechanical intubation due to worsening respiratory failure.  Insulin gtt initiated due to severe hyperglycemia.  With hemorrhagic shock due to bleeding duodenal ulcer, underwent emergent EGD with successful hemostasis by injection of Epinephrine and hemostatic clip. 12/27: Hgb slowly trending down, no report of bleeding from OG suction nor melena. Weaning down vasopressors, if able to continue to wean vasopressors, then can consider WUA/SBT.  Giving temporizing measures for Hyperkalemia. Self extubated but required reintubation. Hgb continued to trend down, given 1 unit pRBC's and 2 FFP.  IR consulted for consideration for embolization. 12/28: Weaned off Vasopressin, Levophed weaning down.  S/p IR embolization of the Duodenal artery. Peak pressures and lung sounds improved with Ketamine, will keep intubated today given rapid reintubation yesterday. 12/30- patient on PRVC FiO2 28%, weaning off ketamine and hoping to perform SBT today.  Holding feeds until post SBT.  11/02/23- patient is stable overnight no acute events, electrllytes improved.  No GI bleeds noted.  Opitimizing for TRH today.  11/03/23 patient is more interactive this am, still has not been able to get OOB.  Will continue to follow her from pulmonary with COPD and lung cancer. Her pathology was sent for second opinion due to mixed reporting from cytotech and pathology report.  11/03/22- patient is good spirits this am. Son came to visit yesterday and we reivewed medical plan. H/h is stable. Following peripherally from pulmonary perspective for COPD and  lung mass.  11/05/23- patient on 3L/min.  She still has 9 day old Right internal jugular central line.  I have placed orders for RN to remove this line today.  She required BIPAP yesterday but states she did not feel SOB>  11/06/23- patient shares she feels well today.  She is on 3L/min but takes it off at times. She reports breathing feels improved 11/07/23- patient with no acute events overnight.  H/h is improved 11/08/23- patient seen at bedside.  Sitting up in bed in no distress denies dyspnea.  She is on nasal canula 2L/min.  She asks if we can help her get glasses and dentures.  I received inconclusive results from lung biopsy. I discussed this with patient.  We discussed repeating the biopsy and patient states she wants this done as soon as possible. She is clinically stable.  Diagnosis Lung, biopsy, Right upper lobe - VERY RARE ATYPICAL CELLS, FAVOR REACTIVE. - FRAGMENTS OF ALVEOLATED LUNG AND BLOOD CLOT. Diagnosis Note Per [facility] recent imaging revealed an enlarging spiculated 1.6 cm right upper lung lesion. This case underwent intradepartmental consultation and Dr. Venetia Night concurs with the interpretation. See concurrent case [id number]. Delice Bison  11/09/23- patient with acute dvt.  Not good candidate for Antcoagulation per GI due to upper GI bleed with duod ulcer 11/10/23- plant for lung biopsy on 11/12/23- reviewed with patient.  NPO Thursday night at midnight  11/11/23- I evaluated patient at bedside, she reports no acute events.  We discussed lung biopsy she is agreeable and wishes to proceed.  She asked if I can help her get glasses and new dentures and I did discuss this with  her son 08/11/24- patient evaluated prior to procedure , on room air saturating 92%. No questions regarding about procedure 11/13/23-patient is on 2L/min [place].  She appears to be in good spirits asking to be discharged.  She does report partial weakness on left side post CVA but on examination from me does not seem to have FND.   She has PT/OT ordered.  She can likely be weaned from oxygen prior to dc home. Her pathology results from lung biopsy are in process.  11/15/23- patient seen at bedside , stable on 3L.  She is being optimized for dc to SNF 11/16/23- patient in bed reports feeling abd pain and dizziness.  She is now on 1.5L/min [place].  Her biopsy results came back as NSCLC and we have contacted oncology.  Ive met with son and reviewed findings.  11/17/23- patient sitting up in bed in NAD.  She is eating lung during my evaluation.  She reports fatigue and is working with PT/OT to be discharged to SNF.  11/19/23- patient seen at bedside with son present.  Patient shares she's feeling better in no pain asking to go home. 11/21/23- patient seen at bedside.  She reports anxiety.  She reports weakness in neck hindering her ability to get up.  She asked for assistance with nourishement.  She reports breathing is stable with no chest pain. I discussed with her RN regarding needing help to eat.    11/22/23- patient is sitting up in bed in no distress, she had PT/OT.  She has son at bedside today.  She is now on room air. She asks regarding dc to rehab.  CBC and bmp is improved.   Objective   Blood pressure 119/67, pulse (!) 104, temperature 97.6 F (36.4 C), resp. rate 18, height 5' (1.524 m), weight 63.8 kg, last menstrual period 12/24/1998, SpO2 99%.       No intake or output data in the 24 hours ending 11/22/23 1328   Filed Weights   11/20/23 0908 11/21/23 0500 11/22/23 0500  Weight: 66.7 kg 64.9 kg 63.8 kg    Examination: General: Awake, follows verbal communication HENT: Supple neck reactive pupils  Lungs: mild wheezing b/l Cardiovascular: Normal S1, Normal S2, RRR  Abdomen: Soft, non tender, non distended, +BS  Extremities: Warm and well perfused no edema.   Labs and imaging were reviewed.   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  This has been sent for second opinion to be reviewed.  I think its most likely primary lung cancer.  S/p lung biopsy- with NSCLS - reported to Dr Alena Bills oncology   Best Practice (right click and "Reselect all SmartList Selections" daily)   Diet/type: NPO DVT prophylaxis SCD Pressure ulcer(s): N/A GI prophylaxis: PPI Lines: Central line Foley:  Yes, and it is still needed Code Status:  full code     Vida Rigger, M.D.  Pulmonary & Critical Care Medicine

## 2023-11-23 DIAGNOSIS — Z515 Encounter for palliative care: Secondary | ICD-10-CM | POA: Diagnosis not present

## 2023-11-23 DIAGNOSIS — J441 Chronic obstructive pulmonary disease with (acute) exacerbation: Secondary | ICD-10-CM | POA: Diagnosis not present

## 2023-11-23 DIAGNOSIS — R911 Solitary pulmonary nodule: Secondary | ICD-10-CM | POA: Diagnosis not present

## 2023-11-23 DIAGNOSIS — G9341 Metabolic encephalopathy: Secondary | ICD-10-CM | POA: Diagnosis not present

## 2023-11-23 LAB — GLUCOSE, CAPILLARY
Glucose-Capillary: 177 mg/dL — ABNORMAL HIGH (ref 70–99)
Glucose-Capillary: 209 mg/dL — ABNORMAL HIGH (ref 70–99)
Glucose-Capillary: 134 mg/dL — ABNORMAL HIGH (ref 70–99)
Glucose-Capillary: 207 mg/dL — ABNORMAL HIGH (ref 70–99)

## 2023-11-23 NOTE — Progress Notes (Signed)
   NAME:  Michele House, MRN:  [id number], DOB:  January 17, 1956, LOS: 52 ADMISSION DATE:  10/02/2023   History of Present Illness:  Case of a 68 year old female patient with a past medical history of COPD with chronic respiratory failure on 2 L nasal cannula, hypertension, type 2 diabetes, heart failure with preserved EF who has been here for a month.  Initially presented with altered mental status encephalopathy.  Had an extensive workup including brain MRI which was negative for any acute intracranial abnormality initially.  However unfortunately course complicated by acute CVA with repeat MRI showing multiple acute infarcts in the high bilateral posterior frontal and parietal lobes.  She recovered from that but course further complicated by waxing and waning mental status which prompted an LP to rule out encephalitis and has been unremarkable so far.  She was found to have right upper lobe lung nodule which was concerning for malignancy and underwent robotic assisted navigational bronchoscopy with biopsy on 12/27 which showed very rare atypical cells and reactive alveolar cells mostly.    Pertinent  Medical History  As abnove  Significant Hospital Events: Including procedures, antibiotic start and stop dates in addition to other pertinent events   11/23/23- patient with lung cancer and COPD.  Doing well and is being optimized for DC to rehab.  She's doing well now and is looking forward to leaving.    Objective   Blood pressure 106/60, pulse 96, temperature 97.8 F (36.6 C), resp. rate 19, height 5' (1.524 m), weight 66.4 kg, last menstrual period 12/24/1998, SpO2 95%.        Intake/Output Summary (Last 24 hours) at 11/23/2023 1635 Last data filed at 11/23/2023 1600 Gross per 24 hour  Intake 480 ml  Output --  Net 480 ml     Filed Weights   11/21/23 0500 11/22/23 0500 11/23/23 0500  Weight: 64.9 kg 63.8 kg 66.4 kg    Examination: General: Awake, follows verbal communication HENT:  Supple neck reactive pupils  Lungs: mild wheezing b/l Cardiovascular: Normal S1, Normal S2, RRR  Abdomen: Soft, non tender, non distended, +BS  Extremities: Warm and well perfused no edema.   Labs and imaging were reviewed.   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  This has been sent for second opinion to be reviewed.  I think its most likely primary lung cancer.  S/p lung biopsy- with NSCLS - reported to Dr Alena Bills oncology   Best Practice (right click and "Reselect all SmartList Selections" daily)   Diet/type: NPO DVT prophylaxis SCD Pressure ulcer(s): N/A GI prophylaxis: PPI Lines: Central line Foley:  Yes, and it is still needed Code Status:  full code     Vida Rigger, M.D.  Pulmonary & Critical Care Medicine

## 2023-11-23 NOTE — Progress Notes (Signed)
Progress Note   Patient: Michele House ZOX:[id number] DOB: 10-12-1956 DOA: 10/02/2023     52 DOS: the patient was seen and examined on 11/23/2023   Brief hospital course: "Michele House is a 68 y.o. female with medical history significant of COPD, chronic respiratory failure on 2 L, hypertension, type 2 diabetes, HFpEF, recent discharge from the hospital 24th November 2024 after hospitalization for COPD exacerbation complicated by respiratory failure.  She presented to the hospital again on 10/02/2023 with chest pain, malaise, cough, shortness of breath, abdominal pain and dysuria.   She was admitted to the hospital for COPD exacerbation, acute on chronic hypoxemic respiratory failure, UTI and pneumonia.  She later developed worsening confusions, MRI brain revealed multiple acute strokes.   Prolonged hospital admission complicated by several issues including hemorrhagic shock requiring transfer to ICU for pressors and GI consultation. EGD showed duodenal ulcer/s that were treated with clipped but later required embolization with IR.  Pt also found with RLE DVT status post IVC filter placement given bleeding anticoagulation contraindicated.   For detailed hospital course and significant events, see TR[facility] progress note by Dr. Myriam Forehand of 11/09/23 and ICU notes from earlier admission.   Further hospital course and management as outlined below.   Assessment and Plan:   Acute metabolic encephalopathy: Improved. Completed course of high-dose IV Solu-Medrol on 10/12/2023 per Neurology Differential diagnosis included autoimmune encephalitis --Delirium precuations --Avoid benzo's & sedating meds   Acute stroke with right hemiplegia: Off of Plavix because of recent acute GI bleeding.   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   New Onset Seizure-like Activity - evening of 11/10/23 - see RN and cross coverage notes --Neurology gave their recommendations --Continue Keppra 500 mg BID --EEG with moderate diffuse encephalopathy --Seizure precautions --PRN Ativan if seizure activity   Acute GI bleeding, bleeding duodenal ulcer:  S/p EGD on 10/28/2023 with clip placed.   S/p IR embolization on 10/30/2023.   --Continue Protonix. --Monitor CBC --Transfuse if Hbg < 7 or less than 8 with active bleeding or hemodynamic instability   Lower extremity pain, more severe in the left, right peroneal DVT: Patient complaining of pain in the lower extremities 1/7.  Initially refused ultrasound of the left lower extremity because of severe pain.  Ultrasound of the right lower extremity showed right calf DVT.  LLE U/S obtained 1/8 after pt agreed - no LLE DVT was seen. Due to high risk of recurrent GI bleeding given recent massive GI bleed requiring embolization by IR, unable to treat with anticoagulation. Vascular surgery placed IVC filter on 11/09/23 Has mild-moderate bony tenderness in both legs: Obtain Vitamin D level   Hemorrhagic shock: Resolved Hypotension: Resolved.  Off of vasopressors   Acute blood loss anemia: H&H is stable.  S/p transfusion with 2 units of PRBCs on 10/28/2023, 1 unit of PRBCs on 10/29/2023.  S/p transfusion with 2 units of platelet pheresis on 10/29/2023. H&H is stable.   Pneumonia: Initially completed 5 days of IV ceftriaxone and azithromycin on 10/07/2023. She was restarted on broad-spectrum antibiotics (vancomycin, ceftriaxone and Flagyl) on 10/28/2023 when she decompensated requiring transfer to the ICU. Acute UTI: Urine culture showed strep agalactiae.  Completed antibiotics.   Acute on chronic diastolic CHF: Stable.   S/p diuresis with IV Lasix.  Continue oral Lasix 20 mg daily Monitor volume status  COPD exacerbation: This had previously resolved, but required  additional course of steroids. Pulmonology following Completed 2nd course w/ prednisone taper  Now stable, monitor closely   Acute on chronic hypoxemic respiratory failure, chronic hypercapnic respiratory failure: She was intubated on 10/28/2023 and extubated on 11/01/2023. Currently she is requiring 2 L/min oxygen via nasal cannula. Continues to desat to low/mid 80's when tried on room air. --Supplement O2, target sats > 88% --Bipap nightly   Type II DM with severe hyperglycemia CBG's remain elevated despite insulin increased yetetrday --Appreciate diabetes coordinator input. --Increase Semglee to 18 units at bedtime --Continue Novolog 4units TID WC, hold if < 50% meal consumed --Sliding scale Novolog 0-9 AC / 0-5 HS   Anxiety/panic attacks: improved.   Avoid benzo's due to waxing and waning mental status.   On Zoloft   Non-small cell carcinoma -- pt found to have 1.3 cm right upper lung spiculated nodule: S/p bronchoscopy on 10/20/2023.  Pathology showed rare atypical cells, but unclear if malignancy and findings more consistent with reactive bronchial cells with acute inflammation. Lung biopsy repeated 1/10, results today per Dr. Alena Bills note. --Follow up with Oncology outpatient --Outpatient PET scan recommended   Hypothyroidism -- pt has been on her Synthroid 100 mcg daily.  Daughter requested 1/11 to have thyroid labs repeated, citing concern for possible Hashimoto's Encephalopathy causing patient's persistent mental status changes.  Advised acute setting and recent illnesses often make TSH and T4 abnormal and not very useful clinically. TSH on 10/06/23 was normal at 3.002 >> now 49.663 Free T4 on 10/06/23 minimally elevated at 1.24 >> now low at 0.38 --Continue Synthroid 150 mcg daily --Repeat TSH and free T4 in 6-8 weeks with PCP --Negative anti-TPO Ab (obtained at daughter's request as she has hashimoto's)     Physical debility : PT and OT recommend discharge to SNF.         Subjective: Pt denies any new symptoms. Mild shortness of breath Denies fever, chest pain, headache, dizziness, nausea/vomiting, constipation  Physical Exam: Vitals:   11/22/23 0745 11/22/23 1626 11/22/23 2026 11/23/23 0500  BP: 119/67 128/70 (!) 140/72   Pulse: (!) 104 98 (!) 102   Resp: 18 16    Temp: 97.6 F (36.4 C) 98 F (36.7 C) 97.8 F (36.6 C)   TempSrc:  Oral Oral   SpO2: 99% 92% 93%   Weight:    66.4 kg  Height:       Physical Exam Constitutional:      General: She is not in acute distress. HENT:     Head: Normocephalic and atraumatic.     Nose: Nose normal. No congestion.     Mouth/Throat:     Mouth: Mucous membranes are moist.     Pharynx: Oropharynx is clear. No oropharyngeal exudate.  Eyes:     Extraocular Movements: Extraocular movements intact.     Conjunctiva/sclera: Conjunctivae normal.  Cardiovascular:     Rate and Rhythm: Normal rate and regular rhythm.  Pulmonary:     Effort: Pulmonary effort is normal.     Breath sounds: Normal breath sounds.  Abdominal:     General: Abdomen is flat. Bowel sounds are normal.     Palpations: Abdomen is soft.  Musculoskeletal:        General: No swelling or tenderness. Normal range of motion.     Cervical back: Normal range of motion.     Comments: Has mild-moderate bony tenderness in both legs:  Skin:    General: Skin is  warm.     Capillary Refill: Capillary refill takes 2 to 3 seconds.     Findings: No rash.  Neurological:     Mental Status: Mental status is at baseline.     Motor: No weakness.  Psychiatric:        Mood and Affect: Mood normal.     Data Reviewed:   Latest Reference Range & Units 11/20/23 13:04  Sodium 135 - 145 mmol/L 140  Potassium 3.5 - 5.1 mmol/L 3.5  Chloride 98 - 111 mmol/L 99  CO2 22 - 32 mmol/L 29  Glucose 70 - 99 mg/dL 914 (H)  BUN 8 - 23 mg/dL 17  Creatinine 7.82 - 9.56 mg/dL 2.13  Calcium 8.9 - 08.6 mg/dL 9.1  Anion gap 5 - 15  12  Phosphorus 2.5 - 4.6 mg/dL 3.5   Magnesium 1.7 - 2.4 mg/dL 2.1  GFR, Estimated >57 mL/min >60  WBC 4.0 - 10.5 K/uL 4.9  RBC 3.87 - 5.11 MIL/uL 3.51 (L)  Hemoglobin 12.0 - 15.0 g/dL 84.6 (L)  HCT 96.2 - 95.2 % 32.0 (L)  MCV 80.0 - 100.0 fL 91.2  MCH 26.0 - 34.0 pg 29.6  MCHC 30.0 - 36.0 g/dL 84.1  RDW 32.4 - 40.1 % 16.3 (H)  Platelets 150 - 400 K/uL 248  nRBC 0.0 - 0.2 % 0.0  (H): Data is abnormally high (L): Data is abnormally low  Family Communication:   Status is: Inpatient  Disposition: Planned Discharge Destination: Pt rehab stay has been denied. Pt can appeal if wants fax [phone].    Skilled nursing facility placement    Time spent: 25 minutes  Author: Ernestene Mention, MD 11/23/2023 9:48 AM  For on call review [email or web address].

## 2023-11-23 NOTE — Plan of Care (Signed)
  Problem: Education: Goal: Ability to describe self-care measures that may prevent or decrease complications (Diabetes Survival Skills Education) will improve Outcome: Progressing   Problem: Coping: Goal: Ability to adjust to condition or change in health will improve Outcome: Progressing   Problem: Fluid Volume: Goal: Ability to maintain a balanced intake and output will improve Outcome: Progressing   Problem: Health Behavior/Discharge Planning: Goal: Ability to identify and utilize available resources and services will improve Outcome: Progressing Goal: Ability to manage health-related needs will improve Outcome: Progressing   Problem: Metabolic: Goal: Ability to maintain appropriate glucose levels will improve Outcome: Progressing   Problem: Nutritional: Goal: Maintenance of adequate nutrition will improve Outcome: Progressing Goal: Progress toward achieving an optimal weight will improve Outcome: Progressing   Problem: Skin Integrity: Goal: Risk for impaired skin integrity will decrease Outcome: Progressing   Problem: Tissue Perfusion: Goal: Adequacy of tissue perfusion will improve Outcome: Progressing   Problem: Education: Goal: Knowledge of General Education information will improve Description: Including pain rating scale, medication(s)/side effects and non-pharmacologic comfort measures Outcome: Progressing   Problem: Health Behavior/Discharge Planning: Goal: Ability to manage health-related needs will improve Outcome: Progressing   Problem: Clinical Measurements: Goal: Ability to maintain clinical measurements within normal limits will improve Outcome: Progressing Goal: Will remain free from infection Outcome: Progressing Goal: Diagnostic test results will improve Outcome: Progressing Goal: Respiratory complications will improve Outcome: Progressing Goal: Cardiovascular complication will be avoided Outcome: Progressing   Problem: Activity: Goal:  Risk for activity intolerance will decrease Outcome: Progressing   Problem: Nutrition: Goal: Adequate nutrition will be maintained Outcome: Progressing   Problem: Coping: Goal: Level of anxiety will decrease Outcome: Progressing   Problem: Elimination: Goal: Will not experience complications related to bowel motility Outcome: Progressing Goal: Will not experience complications related to urinary retention Outcome: Progressing   Problem: Pain Management: Goal: General experience of comfort will improve Outcome: Progressing   Problem: Safety: Goal: Ability to remain free from injury will improve Outcome: Progressing   Problem: Skin Integrity: Goal: Risk for impaired skin integrity will decrease Outcome: Progressing   Problem: Education: Goal: Knowledge of disease or condition will improve Outcome: Progressing Goal: Knowledge of the prescribed therapeutic regimen will improve Outcome: Progressing Goal: Individualized Educational Video(s) Outcome: Progressing   Problem: Activity: Goal: Ability to tolerate increased activity will improve Outcome: Progressing Goal: Will verbalize the importance of balancing activity with adequate rest periods Outcome: Progressing   Problem: Respiratory: Goal: Ability to maintain a clear airway will improve Outcome: Progressing Goal: Levels of oxygenation will improve Outcome: Progressing Goal: Ability to maintain adequate ventilation will improve Outcome: Progressing   Problem: Activity: Goal: Ability to tolerate increased activity will improve Outcome: Progressing   Problem: Clinical Measurements: Goal: Ability to maintain a body temperature in the normal range will improve Outcome: Progressing   Problem: Respiratory: Goal: Ability to maintain adequate ventilation will improve Outcome: Progressing Goal: Ability to maintain a clear airway will improve Outcome: Progressing   Problem: Education: Goal: Knowledge of disease or  condition will improve Outcome: Progressing Goal: Knowledge of secondary prevention will improve (MUST DOCUMENT ALL) Outcome: Progressing Goal: Knowledge of patient specific risk factors will improve Loraine Leriche N/A or DELETE if not current risk factor) Outcome: Progressing   Problem: Ischemic Stroke/TIA Tissue Perfusion: Goal: Complications of ischemic stroke/TIA will be minimized Outcome: Progressing

## 2023-11-24 DIAGNOSIS — G9341 Metabolic encephalopathy: Secondary | ICD-10-CM | POA: Diagnosis not present

## 2023-11-24 LAB — GLUCOSE, CAPILLARY
Glucose-Capillary: 215 mg/dL — ABNORMAL HIGH (ref 70–99)
Glucose-Capillary: 215 mg/dL — ABNORMAL HIGH (ref 70–99)
Glucose-Capillary: 268 mg/dL — ABNORMAL HIGH (ref 70–99)
Glucose-Capillary: 187 mg/dL — ABNORMAL HIGH (ref 70–99)

## 2023-11-24 NOTE — Plan of Care (Signed)
  Problem: Education: Goal: Ability to describe self-care measures that may prevent or decrease complications (Diabetes Survival Skills Education) will improve Outcome: Progressing   Problem: Coping: Goal: Ability to adjust to condition or change in health will improve Outcome: Progressing   Problem: Fluid Volume: Goal: Ability to maintain a balanced intake and output will improve Outcome: Progressing   Problem: Health Behavior/Discharge Planning: Goal: Ability to identify and utilize available resources and services will improve Outcome: Progressing Goal: Ability to manage health-related needs will improve Outcome: Progressing   Problem: Metabolic: Goal: Ability to maintain appropriate glucose levels will improve Outcome: Progressing   Problem: Nutritional: Goal: Maintenance of adequate nutrition will improve Outcome: Progressing Goal: Progress toward achieving an optimal weight will improve Outcome: Progressing   Problem: Skin Integrity: Goal: Risk for impaired skin integrity will decrease Outcome: Progressing   Problem: Tissue Perfusion: Goal: Adequacy of tissue perfusion will improve Outcome: Progressing   Problem: Education: Goal: Knowledge of General Education information will improve Description: Including pain rating scale, medication(s)/side effects and non-pharmacologic comfort measures Outcome: Progressing   Problem: Health Behavior/Discharge Planning: Goal: Ability to manage health-related needs will improve Outcome: Progressing   Problem: Clinical Measurements: Goal: Ability to maintain clinical measurements within normal limits will improve Outcome: Progressing Goal: Will remain free from infection Outcome: Progressing Goal: Diagnostic test results will improve Outcome: Progressing Goal: Respiratory complications will improve Outcome: Progressing Goal: Cardiovascular complication will be avoided Outcome: Progressing   Problem: Activity: Goal:  Risk for activity intolerance will decrease Outcome: Progressing   Problem: Nutrition: Goal: Adequate nutrition will be maintained Outcome: Progressing   Problem: Coping: Goal: Level of anxiety will decrease Outcome: Progressing   Problem: Elimination: Goal: Will not experience complications related to bowel motility Outcome: Progressing Goal: Will not experience complications related to urinary retention Outcome: Progressing   Problem: Pain Management: Goal: General experience of comfort will improve Outcome: Progressing   Problem: Safety: Goal: Ability to remain free from injury will improve Outcome: Progressing   Problem: Skin Integrity: Goal: Risk for impaired skin integrity will decrease Outcome: Progressing   Problem: Education: Goal: Knowledge of disease or condition will improve Outcome: Progressing Goal: Knowledge of the prescribed therapeutic regimen will improve Outcome: Progressing Goal: Individualized Educational Video(s) Outcome: Progressing   Problem: Activity: Goal: Ability to tolerate increased activity will improve Outcome: Progressing Goal: Will verbalize the importance of balancing activity with adequate rest periods Outcome: Progressing   Problem: Respiratory: Goal: Ability to maintain a clear airway will improve Outcome: Progressing Goal: Levels of oxygenation will improve Outcome: Progressing Goal: Ability to maintain adequate ventilation will improve Outcome: Progressing   Problem: Activity: Goal: Ability to tolerate increased activity will improve Outcome: Progressing   Problem: Clinical Measurements: Goal: Ability to maintain a body temperature in the normal range will improve Outcome: Progressing   Problem: Respiratory: Goal: Ability to maintain adequate ventilation will improve Outcome: Progressing Goal: Ability to maintain a clear airway will improve Outcome: Progressing   Problem: Education: Goal: Knowledge of disease or  condition will improve Outcome: Progressing Goal: Knowledge of secondary prevention will improve (MUST DOCUMENT ALL) Outcome: Progressing Goal: Knowledge of patient specific risk factors will improve Loraine Leriche N/A or DELETE if not current risk factor) Outcome: Progressing   Problem: Ischemic Stroke/TIA Tissue Perfusion: Goal: Complications of ischemic stroke/TIA will be minimized Outcome: Progressing

## 2023-11-24 NOTE — Progress Notes (Signed)
   NAME:  Michele House, MRN:  [id number], DOB:  03/23/1956, LOS: 53 ADMISSION DATE:  10/02/2023   History of Present Illness:  Case of a 68 year old female patient with a past medical history of COPD with chronic respiratory failure on 2 L nasal cannula, hypertension, type 2 diabetes, heart failure with preserved EF who has been here for a month.  Initially presented with altered mental status encephalopathy.  Had an extensive workup including brain MRI which was negative for any acute intracranial abnormality initially.  However unfortunately course complicated by acute CVA with repeat MRI showing multiple acute infarcts in the high bilateral posterior frontal and parietal lobes.  She recovered from that but course further complicated by waxing and waning mental status which prompted an LP to rule out encephalitis and has been unremarkable so far.  She was found to have right upper lobe lung nodule which was concerning for malignancy and underwent robotic assisted navigational bronchoscopy with biopsy showing lung cancer.     Pertinent  Medical History  As above  Significant Hospital Events: Including procedures, antibiotic start and stop dates in addition to other pertinent events   11/24/23- patient was tearful during interview today.  She feels her progress to be discharged is being delayed.  She also complains she has not been able to get bed pan for over to urinate.  Her son and another family member are present at bedside.  She is on room air now.  Her lung auscultation is clear.   Objective   Blood pressure 125/80, pulse 97, temperature (!) 97.5 F (36.4 C), temperature source Oral, resp. rate 18, height 5' (1.524 m), weight 65.5 kg, last menstrual period 12/24/1998, SpO2 93%.        Intake/Output Summary (Last 24 hours) at 11/24/2023 1330 Last data filed at 11/24/2023 0357 Gross per 24 hour  Intake 480 ml  Output 500 ml  Net -20 ml     Filed Weights   11/22/23 0500  11/23/23 0500 11/24/23 0500  Weight: 63.8 kg 66.4 kg 65.5 kg    Examination: General: Awake, follows verbal communication HENT: Supple neck reactive pupils  Lungs: mild wheezing b/l Cardiovascular: Normal S1, Normal S2, RRR  Abdomen: Soft, non tender, non distended, +BS  Extremities: Warm and well perfused no edema.   Labs and imaging were reviewed.   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  This has been sent for second opinion to be reviewed.  I think its most likely primary lung cancer.  S/p lung biopsy- with NSCLS - reported to Dr Alena Bills oncology   Best Practice (right click and "Reselect all SmartList Selections" daily)   Diet/type: NPO DVT prophylaxis SCD Pressure ulcer(s): N/A GI prophylaxis: PPI Lines: Central line Foley:  Yes, and it is still needed Code Status:  full code     Vida Rigger, M.D.  Pulmonary & Critical Care Medicine

## 2023-11-24 NOTE — Progress Notes (Signed)
PROGRESS NOTE    MAIZE LINEBERGER  NFA:[id number] DOB: 06/22/1956 DOA: 10/02/2023 PCP: [facility]    Assessment & Plan:   Principal Problem:   Acute metabolic encephalopathy Active Problems:   CVA (cerebral vascular accident) (HCC)   Uncontrolled type 2 diabetes mellitus with hypoglycemia, without long-term current use of insulin (HCC)   Hypotension   Acute on chronic respiratory failure with hypoxia (HCC)   Hypernatremia   COPD exacerbation (HCC)   Lung nodule   Pneumonia   UTI (urinary tract infection)   (HFpEF) heart failure with preserved ejection fraction (HCC)   Essential hypertension   Type 2 diabetes mellitus with obesity (HCC)   Melena   Gastrointestinal hemorrhage with melena   Duodenal ulcer   Acute deep vein thrombosis (DVT) of right lower extremity (HCC)   Bilateral leg pain  Assessment and Plan: Acute metabolic encephalopathy: Improved. Completed steroid course. Ddx included autoimmune encephalitis. Delirium precautions. Avoid benzos & sedating meds   Acute CVA: with right hemiplegia: Off of Plavix because of recent acute GI bleeding. 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   Possible seizure: on evening of 11/10/23. Continue on keppra as per neuro. EEG with moderate diffuse.    Acute GI bleeding: secondary to bleeding duodenal ulcer.S/p EGD on 10/28/2023 with clip placed.   S/p IR embolization on 10/30/2023. Continue on PPI   Right peroneal DVT: initially refused ultrasound of the left lower extremity because of severe pain.  Ultrasound of the right lower extremity showed right calf DVT. Due to high risk of recurrent GI bleeding given recent massive GI bleed requiring embolization by IR, unable to treat with anticoagulation. Vascular surgery  placed IVC filter on 11/09/23   Hemorrhagic shock: Resolved  Hypotension: Resolved.  Off of vasopressors   Acute blood loss anemia: H&H is stable.  S/p 2 units of pRBCs transfused so far. S/p transfusion with 2 units of platelet transfusions.   Pneumonia: initially completed 5 days of IV ceftriaxone and azithromycin on 10/07/2023. Restarted on broad-spectrum antibiotics (vancomycin, ceftriaxone and Flagyl) on 10/28/2023 when she decompensated requiring transfer to the ICU.  Acute UTI: urine cx showed strep agalactiae.  Completed antibiotics.   Acute on chronic diastolic CHF: continue on lasix. Monitor I/Os    COPD exacerbation: completed steroid course x2. Bronchodilators prn. Pulmon following and recs apprec    Acute on chronic hypoxemic respiratory failure & chronic hypercapnic respiratory failure: was intubated on 10/28/2023 and extubated on 11/01/2023. Continue on supplemental oxygen. BiPAP qhs   DM2: likely poorly controlled. Continue on glargine, SSI w/ accuchecks   Anxiety/panic attacks: improved. Severity unknown. Continue on home dose of zoloft   Non-small cell carcinoma -- pt found to have 1.3 cm right upper lung spiculated nodule: S/p bronchoscopy on 10/20/2023.  Pathology showed rare atypical cells, but unclear if malignancy and findings more consistent with reactive bronchial cells with acute inflammation. Lung biopsy repeated 1/10, results today per Dr. Alena Bills note. F/u outpatient w/ onco    Hypothyroidism: continue on home dose of levothyroxine.  Daughter requested 1/11 to have thyroid labs repeated, citing concern for possible Hashimoto's Encephalopathy causing patient's persistent mental status changes.  Advised acute setting and recent illnesses often make TSH and T4 abnormal and not very useful clinically. Negative anti-TPO Ab (obtained at daughter's request as she has hashimoto's)  Physical debility : PT and OT recommend discharge to SNF.      DVT prophylaxis:  SCDs Code Status: DNR Family Communication:  Disposition Plan: pt's insurance denied SNF placement   Level of care: Telemetry Medical  Status is: Inpatient Remains inpatient appropriate because: medically stable. Needs SNF placement     Consultants:  GI Pulmon   Procedures:   Antimicrobials:    Subjective: Pt c/o fatigue   Objective: Vitals:   11/24/23 0354 11/24/23 0500 11/24/23 0722 11/24/23 0819  BP: (!) 142/63   125/80  Pulse: 92   97  Resp:    18  Temp: 98.1 F (36.7 C)   (!) 97.5 F (36.4 C)  TempSrc:    Oral  SpO2: 94%  92% 93%  Weight:  65.5 kg    Height:        Intake/Output Summary (Last 24 hours) at 11/24/2023 0849 Last data filed at 11/24/2023 0357 Gross per 24 hour  Intake 720 ml  Output 500 ml  Net 220 ml   Filed Weights   11/22/23 0500 11/23/23 0500 11/24/23 0500  Weight: 63.8 kg 66.4 kg 65.5 kg    Examination:  General exam: Appears calm and comfortable  Respiratory system: decreased breath sounds b/l Cardiovascular system: S1 & S2 +. No rubs, gallops or clicks.  Gastrointestinal system: Abdomen is nondistended, soft and nontender.  Normal bowel sounds heard. Central nervous system: Alert and awake.  Psychiatry: Judgement and insight appears improved. Flat mood and affect    Data Reviewed: I have personally reviewed following labs and imaging studies  CBC: Recent Labs  Lab 11/20/23 1304  WBC 4.9  HGB 10.4*  HCT 32.0*  MCV 91.2  PLT 248   Basic Metabolic Panel: Recent Labs  Lab 11/20/23 1304  NA 140  K 3.5  CL 99  CO2 29  GLUCOSE 181*  BUN 17  CREATININE 0.86  CALCIUM 9.1  MG 2.1  PHOS 3.5   GFR: Estimated Creatinine Clearance: 52.1 mL/min (by C-G formula based on SCr of 0.86 mg/dL). Liver Function Tests: No results for input(s): "AST", "ALT", "ALKPHOS", "BILITOT", "PROT", "ALBUMIN" in the last 168 hours. No results for input(s): "LIPASE", "AMYLASE" in the last 168 hours. No results for input(s): "AMMONIA" in  the last 168 hours. Coagulation Profile: No results for input(s): "INR", "PROTIME" in the last 168 hours. Cardiac Enzymes: No results for input(s): "CKTOTAL", "CKMB", "CKMBINDEX", "TROPONINI" in the last 168 hours. BNP (last 3 results) No results for input(s): "PROBNP" in the last 8760 hours. HbA1C: No results for input(s): "HGBA1C" in the last 72 hours. CBG: Recent Labs  Lab 11/23/23 0815 11/23/23 1142 11/23/23 1624 11/23/23 2045 11/24/23 0742  GLUCAP 177* 209* 134* 207* 215*   Lipid Profile: No results for input(s): "CHOL", "HDL", "LDLCALC", "TRIG", "CHOLHDL", "LDLDIRECT" in the last 72 hours. Thyroid Function Tests: No results for input(s): "TSH", "T4TOTAL", "FREET4", "T3FREE", "THYROIDAB" in the last 72 hours. Anemia Panel: No results for input(s): "VITAMINB12", "FOLATE", "FERRITIN", "TIBC", "IRON", "RETICCTPCT" in the last 72 hours. Sepsis Labs: No results for input(s): "PROCALCITON", "LATICACIDVEN" in the last 168 hours.  No results found for this or any previous visit (from the past 240 hours).       Radiology Studies: No results found.      Scheduled Meds:  arformoterol  15 mcg Nebulization BID   docusate sodium  100 mg Oral BID   furosemide  20 mg Oral Daily   insulin aspart  0-5 Units Subcutaneous QHS  insulin aspart  0-9 Units Subcutaneous TID WC   insulin aspart  4 Units Subcutaneous TID WC   insulin glargine-yfgn  18 Units Subcutaneous Daily   ipratropium-albuterol  3 mL Nebulization BID   levETIRAcetam  500 mg Oral BID   levothyroxine  150 mcg Oral Q0600   multivitamin with minerals  1 tablet Oral Daily   mouth rinse  15 mL Mouth Rinse BID   pantoprazole  40 mg Oral BID AC   polyethylene glycol  17 g Oral Daily   rOPINIRole  4 mg Oral QHS   senna-docusate  1 tablet Oral Daily   sertraline  50 mg Oral Daily   Continuous Infusions:   LOS: 53 days      Charise Killian, MD [facility] Pager [phone]  If 7PM-7AM, please  contact night-coverage [email or web address] 11/24/2023, 8:49 AM

## 2023-11-24 NOTE — Plan of Care (Signed)
  Problem: Nutritional: Goal: Maintenance of adequate nutrition will improve Outcome: Progressing   Problem: Education: Goal: Knowledge of General Education information will improve Description: Including pain rating scale, medication(s)/side effects and non-pharmacologic comfort measures Outcome: Progressing   Problem: Clinical Measurements: Goal: Will remain free from infection Outcome: Progressing

## 2023-11-24 NOTE — Progress Notes (Signed)
Nutrition Follow-up  DOCUMENTATION CODES:   Not applicable  INTERVENTION:   -Continue MVI with minerals daily -Continue Magic cup TID with meals, each supplement provides 290 kcal and 9 grams of protein   NUTRITION DIAGNOSIS:   Inadequate oral intake related to inability to eat (pt sedated and ventilated) as evidenced by NPO status.  Progressing; advanced to PO diet on 11/01/23   GOAL:   Patient will meet greater than or equal to 90% of their needs  Progressing   MONITOR:   PO intake, Supplement acceptance, Diet advancement  REASON FOR ASSESSMENT:   Ventilator    ASSESSMENT:   68 y/o female with h/o HTN, HLD, COPD, CHF, thyroid disease, lung nodule and GERD who is admitted with COPD/asthma exacerbation in the setting of metapneumovirus infection, acute anemia with hemorrhagic shock secondary to duodenal ulcer s/p EGD with clip and Injection 10/28/2023 and s/p IR Embolization on 10/30/2023, AKI and acute CVA with right sided hemiparesis.  12/30- extubated, advanced to a dysphagia 3 diet  1/2- s/p BSE- advanced to regular diet with thin liquids 1/7- s/p IVC filter placement 1/9- s/p EEG- moderate diffuse encephalopathy 1/10- s/p bronchoscopy 1/14- per oncology notes, lung biopsy revealed non small cell carcinoma 1/20- rehab stay denied  Per oncology notes, plan for PET scan as an outpatient.   Pt remains with good appetite on carb modified diet. Noted meal completions 50-100%.   Wt has been stable over the past week.   Medications reviewed and include colace, lasix, protonix, miralax, and senokot.   Per TOC notes, SNF stay was denied by insurance. Plan to discuss appeal.   Labs reviewed: CBGS: 62-215 (inpatient orders for glycemic control are 0-5 units insulin aspart daily at bedtime, 0-9 units insulin aspart TID with meals, 4 units insulin aspart TID with meals, and 18 units insulin glargine-yfgn daily).    Diet Order:   Diet Order             Diet Carb  Modified Fluid consistency: Thin  Diet effective now                   EDUCATION NEEDS:   No education needs have been identified at this time  Skin:  Skin Assessment: Skin Integrity Issues: Skin Integrity Issues:: Incisions Incisions: closed rt groin  Last BM:  11/24/23 (type 6)  Height:   Ht Readings from Last 1 Encounters:  11/12/23 5' (1.524 m)    Weight:   Wt Readings from Last 1 Encounters:  11/24/23 65.5 kg    Ideal Body Weight:  45.45 kg  BMI:  Body mass index is 28.2 kg/m.  Estimated Nutritional Needs:   Kcal:  1600-1800  Protein:  85-100 grams  Fluid:  > 1.6 L    Levada Schilling, RD, LDN, CDCES Registered Dietitian III Certified Diabetes Care and Education Specialist If unable to reach this RD, please use "RD Inpatient" group chat on secure chat between hours of 8am-4 pm daily

## 2023-11-24 NOTE — Progress Notes (Signed)
Occupational Therapy Treatment Patient Details Name: Michele House MRN: [id number] DOB: May 10, 1956 Today's Date: 11/24/2023   History of present illness Patient is a 68 year old with prolonged hospital stay, originally admitted with AMS. 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 She developed respiratory distress requiring mechanical ventilation. Self extubated 12/27 with re-intubation and extubation on 12/30. Pt also found with RLE DVT status post IVC filter placement given bleeding anticoagulation contraindicated. Seizure on 1/8 with transfer to 2nd floor.   OT comments  Pt. performed PROM/AAROM for right shoulder flexion, abduction, horizontal abduction, AAROM for elbow flexion, extension, AROM wrist extension, gross digit flexion, and extension, thumb abduction with reps of holding with resistance. Pt. Education was provided about self-ROM with cues, and assist using the left hand. Facilitated RUE ROM for hand to face patterns in preparation for one step grooming tasks. Pt. required Mod hand over hand assist for brushing the front, and sides of the hair consistently with support proximally at the right elbow, and wrist. Pt. Attempted to work on opening bottles using her right thumb, and 2nd digit while supporting the base of the bottle with the left hand. Pt. Was assisted with positioning upright in midline long sitting. Pt. education was provided about positioning the RUE on a pillow for support. Pt. education was also provided about finding opportunities throughout the day to engage the right hand to facilitate, and enhance motor relearning. Pt. continues to benefit from OT services for ADL training, A/E training, neuromuscular re-education, there. Ex. and pt. education about home modification, and DME. OT discharge recommendations remain appropriate.  Ot     If plan is discharge home, recommend the following:  Assistance with cooking/housework;Assist for  transportation;Help with stairs or ramp for entrance;Direct supervision/assist for medications management;Supervision due to cognitive status;Direct supervision/assist for financial management;Two people to help with walking and/or transfers;Two people to help with bathing/dressing/bathroom   Equipment Recommendations       Recommendations for Other Services      Precautions / Restrictions Precautions Precautions: Fall Precaution Comments: right side hemiparesis Restrictions Weight Bearing Restrictions Per Provider Order: No       Mobility Bed Mobility         Supine to sit: Max assist     General bed mobility comments: MaxA repositioing upright in midline position    Transfers                         Balance                                           ADL either performed or assessed with clinical judgement   ADL Overall ADL's : Needs assistance/impaired   Eating/Feeding Details (indicate cue type and reason): Hand-over-hand assist required to stabilize a cup in peparation for drinking from it. Grooming: Moderate assistance Grooming Details (indicate cue type and reason): Hand-over hand assist with the right for hand to face patterns in preapration for light grooming.                                    Extremity/Trunk Assessment Upper Extremity Assessment Upper Extremity Assessment: RUE deficits/detail RUE Deficits / Details: hemi weakness proximally through shoulder ranges, elbow, and grip weakness RUE: Subluxation noted  RUE Sensation: decreased light touch RUE Coordination: decreased fine motor;decreased gross motor            Vision       Perception     Praxis      Cognition Arousal: Alert Behavior During Therapy: Impulsive, WFL for tasks assessed/performed Overall Cognitive Status: Within Functional Limits for tasks assessed Area of Impairment: Attention, Memory, Following commands, Safety/judgement,  Awareness                       Following Commands: Follows one step commands with increased time     Problem Solving: Slow processing, Decreased initiation, Difficulty sequencing, Requires verbal cues, Requires tactile cues          Exercises      Shoulder Instructions       General Comments      Pertinent Vitals/ Pain       Pain Assessment Pain Assessment: No/denies pain  Home Living                                          Prior Functioning/Environment              Frequency  Min 1X/week        Progress Toward Goals  OT Goals(current goals can now be found in the care plan section)  Progress towards OT goals: Progressing toward goals  Acute Rehab OT Goals Patient Stated Goal: To retrun home OT Goal Formulation: With patient Time For Goal Achievement: 11/26/23 Potential to Achieve Goals: Fair  Plan      Co-evaluation                 AM-PAC OT "6 Clicks" Daily Activity     Outcome Measure   Help from another person eating meals?: A Lot Help from another person taking care of personal grooming?: A Lot Help from another person toileting, which includes using toliet, bedpan, or urinal?: Total Help from another person bathing (including washing, rinsing, drying)?: Total Help from another person to put on and taking off regular upper body clothing?: A Lot Help from another person to put on and taking off regular lower body clothing?: Total 6 Click Score: 9    End of Session    OT Visit Diagnosis: Muscle weakness (generalized) (M62.81);Hemiplegia and hemiparesis Hemiplegia - Right/Left: Right Hemiplegia - caused by: Cerebral infarction   Activity Tolerance Patient tolerated treatment well   Patient Left in bed;with call bell/phone within reach;with bed alarm set   Nurse Communication          Time: 2841-3244 OT Time Calculation (min): 17 min  Charges: OT General Charges $OT Visit: 1 Visit OT  Treatments $Self Care/Home Management : 8-22 mins  Olegario Messier, MS, OTR/L   Olegario Messier 11/24/2023, 9:34 AM

## 2023-11-24 NOTE — TOC Progression Note (Signed)
Transition of Care [facility]) - Progression Note    Patient Details  Name: Michele House MRN: [id number] Date of Birth: Jan 31, 1956  [facility] CM/SW Contact  Garret Reddish, RN Phone Number: 11/24/2023, 3:10 PM  Clinical Narrative:    Chart reviewed.  Noted that SNF has been denied.    I have informed patient's daughter Gunnar Fusi of the above information.  I have given Gunnar Fusi the appeal information.  Gunnar Fusi reports that she is at work today and will try to appeal once she gets home.    TOC will follow progress of patient.     Expected Discharge Plan: Skilled Nursing Facility Barriers to Discharge: Continued Medical Work up  Expected Discharge Plan and Services         Expected Discharge Date: 11/09/23                                     Social Determinants of Health (SDOH) Interventions SDOH Screenings   Food Insecurity: Patient Unable To Answer (10/03/2023)  Recent Concern: Food Insecurity - Food Insecurity Present (09/24/2023)  Housing: High Risk (10/03/2023)  Transportation Needs: Patient Unable To Answer (10/03/2023)  Recent Concern: Transportation Needs - Unmet Transportation Needs (09/24/2023)  Utilities: Patient Unable To Answer (10/03/2023)  Social Connections: Patient Declined (11/02/2023)  Tobacco Use: Medium Risk (11/12/2023)    Readmission Risk Interventions     No data to display

## 2023-11-25 DIAGNOSIS — G9341 Metabolic encephalopathy: Secondary | ICD-10-CM | POA: Diagnosis not present

## 2023-11-25 LAB — GLUCOSE, CAPILLARY
Glucose-Capillary: 156 mg/dL — ABNORMAL HIGH (ref 70–99)
Glucose-Capillary: 173 mg/dL — ABNORMAL HIGH (ref 70–99)
Glucose-Capillary: 182 mg/dL — ABNORMAL HIGH (ref 70–99)
Glucose-Capillary: 243 mg/dL — ABNORMAL HIGH (ref 70–99)
Glucose-Capillary: 97 mg/dL (ref 70–99)

## 2023-11-25 NOTE — Progress Notes (Signed)
PROGRESS NOTE    Michele House  ZHY:[id number] DOB: [birth year] DOA: 10/02/2023 PCP: [facility]    Assessment & Plan:   Principal Problem:   Acute metabolic encephalopathy Active Problems:   CVA (cerebral vascular accident) (HCC)   Uncontrolled type 2 diabetes mellitus with hypoglycemia, without long-term current use of insulin (HCC)   Hypotension   Acute on chronic respiratory failure with hypoxia (HCC)   Hypernatremia   COPD exacerbation (HCC)   Lung nodule   Pneumonia   UTI (urinary tract infection)   (HFpEF) heart failure with preserved ejection fraction (HCC)   Essential hypertension   Type 2 diabetes mellitus with obesity (HCC)   Melena   Gastrointestinal hemorrhage with melena   Duodenal ulcer   Acute deep vein thrombosis (DVT) of right lower extremity (HCC)   Bilateral leg pain  Assessment and Plan: Acute metabolic encephalopathy: improved. Completed steroid course. Ddx included autoimmune encephalitis. Delirium precautions. Avoid benzos & sedating meds   Acute CVA: with right hemiplegia: Off of Plavix because of recent acute GI bleeding. 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   Possible seizure: on evening of 11/10/23. Continue on keppra as per neuro    Acute GI bleeding: secondary to bleeding duodenal ulcer.S/p EGD on 10/28/2023 with clip placed.   S/p IR embolization on 10/30/2023. Continue on PPI   Right peroneal DVT: initially refused ultrasound of the left lower extremity because of severe pain.  Ultrasound of the right lower extremity showed right calf DVT. Due to high risk of recurrent GI bleeding given recent massive GI bleed requiring embolization by IR, unable to treat with anticoagulation. Vascular surgery placed IVC filter on 11/09/23    Hemorrhagic shock: Resolved  Hypotension: resolved    Acute blood loss anemia: H&H is stable.  S/p 2 units of pRBCs transfused so far. S/p transfusion with 2 units of platelet transfusions.   Pneumonia: initially completed 5 days of IV ceftriaxone and azithromycin on 10/07/2023. Restarted on broad-spectrum antibiotics (vancomycin, ceftriaxone and Flagyl) on 10/28/2023 when she decompensated requiring transfer to the ICU. Completed abx course x 2.   Acute UTI: urine cx showed strep agalactiae.  Completed antibiotics.   Acute on chronic diastolic CHF: continue on lasix. Monitor I/Os    COPD exacerbation: completed steroid course x2. Bronchodilators prn. Pulmon following and recs apprec    Acute on chronic hypoxemic respiratory failure & chronic hypercapnic respiratory failure: was intubated on 10/28/2023 and extubated on 11/01/2023. BiPAP at bedtime. Continue on supplemental oxygen    DM2: likely poorly controlled. Continue on glargine, SSI w/ accuchecks   Anxiety/panic attacks: improved. Severity unknown. Continue on home dose of zoloft   Non-small cell carcinoma -- pt found to have 1.3 cm right upper lung spiculated nodule: S/p bronchoscopy on 10/20/2023.  Pathology showed rare atypical cells, but unclear if malignancy and findings more consistent with reactive bronchial cells with acute inflammation. Lung biopsy repeated 1/10, results today per Dr. Alena Bills note. F/u outpatient w/ onco    Hypothyroidism: continue on home dose of levothyroxine.  Daughter requested 1/11 to have thyroid labs repeated, citing concern for possible Hashimoto's Encephalopathy causing patient's persistent mental status changes.  Advised acute setting and recent illnesses often make TSH and T4 abnormal and not very useful clinically. Negative anti-TPO Ab (obtained at daughter's request as she has hashimoto's)  Physical debility : PT/OT recs SNF. Pt's insurance denied SNF. Pt's daughter plans to appeal the denial  today       DVT prophylaxis: SCDs Code Status: DNR Family Communication:  Disposition Plan: pt's insurance denied SNF placement and pt's daughter plans to appeal the denial today   Level of care: Telemetry Medical  Status is: Inpatient Remains inpatient appropriate because: medically stable. Needs SNF placement but pt's insurance denied SNF and pt's daughter plans to appeal the denial today     Consultants:  GI Pulmon   Procedures:   Antimicrobials:    Subjective: Pt c/o malaise   Objective: Vitals:   11/24/23 2034 11/25/23 0258 11/25/23 0304 11/25/23 0751  BP:  115/74    Pulse:  95    Resp:  18    Temp:  98.6 F (37 C)    TempSrc:      SpO2: 92% 93%  92%  Weight:   67.7 kg   Height:        Intake/Output Summary (Last 24 hours) at 11/25/2023 0906 Last data filed at 11/25/2023 0304 Gross per 24 hour  Intake 240 ml  Output 500 ml  Net -260 ml   Filed Weights   11/23/23 0500 11/24/23 0500 11/25/23 0304  Weight: 66.4 kg 65.5 kg 67.7 kg    Examination:  General exam: Appears comfortable  Respiratory system: diminished breath sounds b/l  Cardiovascular system: S1/S2+. No rubs or clicks  Gastrointestinal system: Abd is soft, NT, ND & hypoactive bowel sounds  Central nervous system: alert & oriented. Moves all extremities  Psychiatry: Judgement and insight appears improved. Flat mood and affect    Data Reviewed: I have personally reviewed following labs and imaging studies  CBC: Recent Labs  Lab 11/20/23 1304  WBC 4.9  HGB 10.4*  HCT 32.0*  MCV 91.2  PLT 248   Basic Metabolic Panel: Recent Labs  Lab 11/20/23 1304  NA 140  K 3.5  CL 99  CO2 29  GLUCOSE 181*  BUN 17  CREATININE 0.86  CALCIUM 9.1  MG 2.1  PHOS 3.5   GFR: Estimated Creatinine Clearance: 53 mL/min (by C-G formula based on SCr of 0.86 mg/dL). Liver Function Tests: No results for input(s): "AST", "ALT", "ALKPHOS", "BILITOT", "PROT", "ALBUMIN" in the last 168 hours. No  results for input(s): "LIPASE", "AMYLASE" in the last 168 hours. No results for input(s): "AMMONIA" in the last 168 hours. Coagulation Profile: No results for input(s): "INR", "PROTIME" in the last 168 hours. Cardiac Enzymes: No results for input(s): "CKTOTAL", "CKMB", "CKMBINDEX", "TROPONINI" in the last 168 hours. BNP (last 3 results) No results for input(s): "PROBNP" in the last 8760 hours. HbA1C: No results for input(s): "HGBA1C" in the last 72 hours. CBG: Recent Labs  Lab 11/24/23 0742 11/24/23 1141 11/24/23 1603 11/24/23 2045 11/25/23 0008  GLUCAP 215* 215* 268* 187* 156*   Lipid Profile: No results for input(s): "CHOL", "HDL", "LDLCALC", "TRIG", "CHOLHDL", "LDLDIRECT" in the last 72 hours. Thyroid Function Tests: No results for input(s): "TSH", "T4TOTAL", "FREET4", "T3FREE", "THYROIDAB" in the last 72 hours. Anemia Panel: No results for input(s): "VITAMINB12", "FOLATE", "FERRITIN", "TIBC", "IRON", "RETICCTPCT" in the last 72 hours. Sepsis Labs: No results for input(s): "PROCALCITON", "LATICACIDVEN" in the last 168 hours.  No results found for this or any previous visit (from the past 240 hours).       Radiology Studies: No results found.      Scheduled Meds:  arformoterol  15 mcg Nebulization BID   docusate  sodium  100 mg Oral BID   furosemide  20 mg Oral Daily   insulin aspart  0-5 Units Subcutaneous QHS   insulin aspart  0-9 Units Subcutaneous TID WC   insulin aspart  4 Units Subcutaneous TID WC   insulin glargine-yfgn  18 Units Subcutaneous Daily   ipratropium-albuterol  3 mL Nebulization BID   levETIRAcetam  500 mg Oral BID   levothyroxine  150 mcg Oral Q0600   multivitamin with minerals  1 tablet Oral Daily   mouth rinse  15 mL Mouth Rinse BID   pantoprazole  40 mg Oral BID AC   polyethylene glycol  17 g Oral Daily   rOPINIRole  4 mg Oral QHS   senna-docusate  1 tablet Oral Daily   sertraline  50 mg Oral Daily   Continuous Infusions:   LOS:  54 days      Charise Killian, MD [facility] Pager [phone]  If 7PM-7AM, please contact night-coverage [email or web address] 11/25/2023, 9:06 AM

## 2023-11-25 NOTE — Plan of Care (Signed)
  Problem: Education: Goal: Ability to describe self-care measures that may prevent or decrease complications (Diabetes Survival Skills Education) will improve Outcome: Progressing   Problem: Coping: Goal: Ability to adjust to condition or change in health will improve Outcome: Progressing   Problem: Fluid Volume: Goal: Ability to maintain a balanced intake and output will improve Outcome: Progressing   Problem: Health Behavior/Discharge Planning: Goal: Ability to identify and utilize available resources and services will improve Outcome: Progressing Goal: Ability to manage health-related needs will improve Outcome: Progressing   Problem: Metabolic: Goal: Ability to maintain appropriate glucose levels will improve Outcome: Progressing   Problem: Nutritional: Goal: Maintenance of adequate nutrition will improve Outcome: Progressing Goal: Progress toward achieving an optimal weight will improve Outcome: Progressing   Problem: Skin Integrity: Goal: Risk for impaired skin integrity will decrease Outcome: Progressing   Problem: Tissue Perfusion: Goal: Adequacy of tissue perfusion will improve Outcome: Progressing   Problem: Education: Goal: Knowledge of General Education information will improve Description: Including pain rating scale, medication(s)/side effects and non-pharmacologic comfort measures Outcome: Progressing   Problem: Health Behavior/Discharge Planning: Goal: Ability to manage health-related needs will improve Outcome: Progressing   Problem: Clinical Measurements: Goal: Ability to maintain clinical measurements within normal limits will improve Outcome: Progressing Goal: Will remain free from infection Outcome: Progressing Goal: Diagnostic test results will improve Outcome: Progressing Goal: Respiratory complications will improve Outcome: Progressing Goal: Cardiovascular complication will be avoided Outcome: Progressing   Problem: Activity: Goal:  Risk for activity intolerance will decrease Outcome: Progressing   Problem: Nutrition: Goal: Adequate nutrition will be maintained Outcome: Progressing   Problem: Coping: Goal: Level of anxiety will decrease Outcome: Progressing   Problem: Elimination: Goal: Will not experience complications related to bowel motility Outcome: Progressing Goal: Will not experience complications related to urinary retention Outcome: Progressing   Problem: Pain Management: Goal: General experience of comfort will improve Outcome: Progressing   Problem: Safety: Goal: Ability to remain free from injury will improve Outcome: Progressing   Problem: Skin Integrity: Goal: Risk for impaired skin integrity will decrease Outcome: Progressing   Problem: Education: Goal: Knowledge of disease or condition will improve Outcome: Progressing Goal: Knowledge of the prescribed therapeutic regimen will improve Outcome: Progressing Goal: Individualized Educational Video(s) Outcome: Progressing   Problem: Activity: Goal: Ability to tolerate increased activity will improve Outcome: Progressing Goal: Will verbalize the importance of balancing activity with adequate rest periods Outcome: Progressing   Problem: Respiratory: Goal: Ability to maintain a clear airway will improve Outcome: Progressing Goal: Levels of oxygenation will improve Outcome: Progressing Goal: Ability to maintain adequate ventilation will improve Outcome: Progressing   Problem: Activity: Goal: Ability to tolerate increased activity will improve Outcome: Progressing   Problem: Clinical Measurements: Goal: Ability to maintain a body temperature in the normal range will improve Outcome: Progressing   Problem: Respiratory: Goal: Ability to maintain adequate ventilation will improve Outcome: Progressing Goal: Ability to maintain a clear airway will improve Outcome: Progressing   Problem: Education: Goal: Knowledge of disease or  condition will improve Outcome: Progressing Goal: Knowledge of secondary prevention will improve (MUST DOCUMENT ALL) Outcome: Progressing Goal: Knowledge of patient specific risk factors will improve Loraine Leriche N/A or DELETE if not current risk factor) Outcome: Progressing   Problem: Ischemic Stroke/TIA Tissue Perfusion: Goal: Complications of ischemic stroke/TIA will be minimized Outcome: Progressing

## 2023-11-25 NOTE — Plan of Care (Signed)
Problem: Education: Goal: Ability to describe self-care measures that may prevent or decrease complications (Diabetes Survival Skills Education) will improve Outcome: Progressing   Problem: Coping: Goal: Ability to adjust to condition or change in health will improve Outcome: Progressing   Problem: Fluid Volume: Goal: Ability to maintain a balanced intake and output will improve Outcome: Progressing   Problem: Health Behavior/Discharge Planning: Goal: Ability to identify and utilize available resources and services will improve Outcome: Progressing Goal: Ability to manage health-related needs will improve Outcome: Progressing   Problem: Metabolic: Goal: Ability to maintain appropriate glucose levels will improve Outcome: Progressing   Problem: Nutritional: Goal: Maintenance of adequate nutrition will improve Outcome: Progressing Goal: Progress toward achieving an optimal weight will improve Outcome: Progressing   Problem: Skin Integrity: Goal: Risk for impaired skin integrity will decrease Outcome: Progressing   Problem: Tissue Perfusion: Goal: Adequacy of tissue perfusion will improve Outcome: Progressing   Problem: Education: Goal: Knowledge of General Education information will improve Description: Including pain rating scale, medication(s)/side effects and non-pharmacologic comfort measures Outcome: Progressing   Problem: Health Behavior/Discharge Planning: Goal: Ability to manage health-related needs will improve Outcome: Progressing   Problem: Clinical Measurements: Goal: Ability to maintain clinical measurements within normal limits will improve Outcome: Progressing Goal: Will remain free from infection Outcome: Progressing Goal: Diagnostic test results will improve Outcome: Progressing Goal: Respiratory complications will improve Outcome: Progressing Goal: Cardiovascular complication will be avoided Outcome: Progressing   Problem: Activity: Goal:  Risk for activity intolerance will decrease Outcome: Progressing   Problem: Nutrition: Goal: Adequate nutrition will be maintained Outcome: Progressing   Problem: Coping: Goal: Level of anxiety will decrease Outcome: Progressing   Problem: Elimination: Goal: Will not experience complications related to bowel motility Outcome: Progressing Goal: Will not experience complications related to urinary retention Outcome: Progressing   Problem: Pain Management: Goal: General experience of comfort will improve Outcome: Progressing   Problem: Safety: Goal: Ability to remain free from injury will improve Outcome: Progressing   Problem: Skin Integrity: Goal: Risk for impaired skin integrity will decrease Outcome: Progressing   Problem: Education: Goal: Knowledge of disease or condition will improve Outcome: Progressing Goal: Knowledge of the prescribed therapeutic regimen will improve Outcome: Progressing Goal: Individualized Educational Video(s) Outcome: Progressing   Problem: Activity: Goal: Ability to tolerate increased activity will improve Outcome: Progressing Goal: Will verbalize the importance of balancing activity with adequate rest periods Outcome: Progressing   Problem: Respiratory: Goal: Ability to maintain a clear airway will improve Outcome: Progressing Goal: Levels of oxygenation will improve Outcome: Progressing Goal: Ability to maintain adequate ventilation will improve Outcome: Progressing   Problem: Activity: Goal: Ability to tolerate increased activity will improve Outcome: Progressing   Problem: Clinical Measurements: Goal: Ability to maintain a body temperature in the normal range will improve Outcome: Progressing   Problem: Respiratory: Goal: Ability to maintain adequate ventilation will improve Outcome: Progressing Goal: Ability to maintain a clear airway will improve Outcome: Progressing   Problem: Education: Goal: Knowledge of disease or  condition will improve Outcome: Progressing Goal: Knowledge of secondary prevention will improve (MUST DOCUMENT ALL) Outcome: Progressing Goal: Knowledge of patient specific risk factors will improve Loraine Leriche N/A or DELETE if not current risk factor) Outcome: Progressing   Problem: Ischemic Stroke/TIA Tissue Perfusion: Goal: Complications of ischemic stroke/TIA will be minimized Outcome: Progressing   Problem: Coping: Goal: Will verbalize positive feelings about self Outcome: Progressing Goal: Will identify appropriate support needs Outcome: Progressing   Problem: Health Behavior/Discharge Planning: Goal: Ability to manage  health-related needs will improve Outcome: Progressing Goal: Goals will be collaboratively established with patient/family Outcome: Progressing   Problem: Self-Care: Goal: Ability to participate in self-care as condition permits will improve Outcome: Progressing Goal: Verbalization of feelings and concerns over difficulty with self-care will improve Outcome: Progressing Goal: Ability to communicate needs accurately will improve Outcome: Progressing   Problem: Nutrition: Goal: Risk of aspiration will decrease Outcome: Progressing Goal: Dietary intake will improve Outcome: Progressing   Problem: Education: Goal: Knowledge of disease or condition will improve Outcome: Progressing Goal: Knowledge of secondary prevention will improve (MUST DOCUMENT ALL) Outcome: Progressing Goal: Knowledge of patient specific risk factors will improve Loraine Leriche N/A or DELETE if not current risk factor) Outcome: Progressing   Problem: Ischemic Stroke/TIA Tissue Perfusion: Goal: Complications of ischemic stroke/TIA will be minimized Outcome: Progressing   Problem: Coping: Goal: Will verbalize positive feelings about self Outcome: Progressing Goal: Will identify appropriate support needs Outcome: Progressing   Problem: Health Behavior/Discharge Planning: Goal: Ability to  manage health-related needs will improve Outcome: Progressing Goal: Goals will be collaboratively established with patient/family Outcome: Progressing   Problem: Self-Care: Goal: Ability to participate in self-care as condition permits will improve Outcome: Progressing Goal: Verbalization of feelings and concerns over difficulty with self-care will improve Outcome: Progressing Goal: Ability to communicate needs accurately will improve Outcome: Progressing   Problem: Nutrition: Goal: Risk of aspiration will decrease Outcome: Progressing Goal: Dietary intake will improve Outcome: Progressing   Problem: Education: Goal: Understanding of CV disease, CV risk reduction, and recovery process will improve Outcome: Progressing Goal: Individualized Educational Video(s) Outcome: Progressing   Problem: Activity: Goal: Ability to return to baseline activity level will improve Outcome: Progressing   Problem: Cardiovascular: Goal: Ability to achieve and maintain adequate cardiovascular perfusion will improve Outcome: Progressing Goal: Vascular access site(s) Level 0-1 will be maintained Outcome: Progressing   Problem: Health Behavior/Discharge Planning: Goal: Ability to safely manage health-related needs after discharge will improve Outcome: Progressing   Problem: Activity: Goal: Ability to tolerate increased activity will improve Outcome: Progressing   Problem: Respiratory: Goal: Ability to maintain a clear airway and adequate ventilation will improve Outcome: Progressing

## 2023-11-25 NOTE — Progress Notes (Signed)
Physical Therapy Treatment Patient Details Name: Michele House MRN: [id number] DOB: 1956/03/25 Today's Date: 11/25/2023   History of Present Illness Patient is a 68 year old with prolonged hospital stay, originally admitted with AMS. 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 She developed respiratory distress requiring mechanical ventilation. Self extubated 12/27 with re-intubation and extubation on 12/30. Pt also found with RLE DVT status post IVC filter placement given bleeding anticoagulation contraindicated. Seizure on 1/8 with transfer to 2nd floor.    PT Comments  Pt was long sitting in bed upon arrival. She greets Chartered loss adjuster and remains cooperative and pleasant throughout. Session greatly limited by pt's poor activity tolerance and weakness however pt was able to tolerate more activity. She was able to progress to EOB with +1 assistance. Stood 2 x with author blocking pt's knees prior to +2 to stand pivot to recliner. Pt sat in recliner x almost 20 minutes however due to balance deficits/ sliding in chair, author elected to assist pt back to bed +1 max assist to stand pivot towards L back to EOB. Pt very fatigued after session but was also very proud of how she progressed. Acute PT will continue to follow per current POC. Dc recs remain appropriate.    If plan is discharge home, recommend the following: Two people to help with walking and/or transfers;Two people to help with bathing/dressing/bathroom;Assistance with cooking/housework;Direct supervision/assist for medications management;Assistance with feeding;Direct supervision/assist for financial management;Assist for transportation;Help with stairs or ramp for entrance;Supervision due to cognitive status     Equipment Recommendations  Other (comment) (defer to next level of care)       Precautions / Restrictions Precautions Precautions: Fall Precaution Comments: right side  hemiparesis Restrictions Weight Bearing Restrictions Per Provider Order: No     Mobility  Bed Mobility Overal bed mobility: Needs Assistance Bed Mobility: Supine to Sit, Sit to Supine  Supine to sit: Max assist Sit to supine: Max assist General bed mobility comments: pt was able to roll R to short sit with vcs./tcs + amx assist    Transfers Overall transfer level: Needs assistance Equipment used: None Transfers: Bed to chair/wheelchair/BSC, Sit to/from Stand Sit to Stand: Max assist, From elevated surface Stand pivot transfers: Max assist, +2 safety/equipment, From elevated surface  General transfer comment: pt stood 2 x EOB with +1 max assist prior to stand pivot to recliner with +2 assistance. pt sat in recliner while performing several other exercises/activity prior to +1 max assist to stand pivot back to bed. pt perform pivot towards L every transfer.    Ambulation/Gait  General Gait Details: pt is currently unable   Balance Overall balance assessment: Needs assistance Sitting-balance support: Bilateral upper extremity supported, No upper extremity supported, Feet supported Sitting balance-Leahy Scale: Poor Sitting balance - Comments: pt is able to maintain balance  with pillow support and vcs for wt shifting with mod assist. Poor overall balance in sitting   Standing balance support: During functional activity Standing balance-Leahy Scale: Zero Standing balance comment: pt is unabel to maintain standing balance without max assist throughout     Cognition Arousal: Alert Behavior During Therapy: WFL for tasks assessed/performed Overall Cognitive Status: No family/caregiver present to determine baseline cognitive functioning  Safety/Judgement: Decreased awareness of safety, Decreased awareness of deficits Awareness: Intellectual Problem Solving: Slow processing, Decreased initiation, Difficulty sequencing, Requires verbal cues, Requires tactile cues General Comments: Pt is  alert and very cooperative throughout. She was very proud of herself after getting  to/from recliner but endorses severe fatigue.RN tech in room at [employer] of session with pt sitting on bed pan               Pertinent Vitals/Pain Pain Assessment Pain Assessment: 0-10 Pain Score: 4  Pain Location: neck pain Pain Descriptors / Indicators: Discomfort Pain Intervention(s): Limited activity within patient's tolerance, Monitored during session, Premedicated before session, Repositioned     PT Goals (current goals can now be found in the care plan section) Acute Rehab PT Goals Patient Stated Goal: "Get better so I can get home." Progress towards PT goals: Progressing toward goals    Frequency    Min 1X/week           Co-evaluation     PT goals addressed during session: Mobility/safety with mobility;Balance;Proper use of DME;Strengthening/ROM        AM-PAC PT "6 Clicks" Mobility   Outcome Measure  Help needed turning from your back to your side while in a flat bed without using bedrails?: A Lot Help needed moving from lying on your back to sitting on the side of a flat bed without using bedrails?: A Lot Help needed moving to and from a bed to a chair (including a wheelchair)?: A Lot Help needed standing up from a chair using your arms (e.g., wheelchair or bedside chair)?: Total Help needed to walk in hospital room?: Total Help needed climbing 3-5 steps with a railing? : Total 6 Click Score: 9    End of Session   Activity Tolerance: Patient tolerated treatment well;Patient limited by fatigue Patient left: in bed;with call bell/phone within reach;with bed alarm set;with family/visitor present (pt sat in recliner x ~ 20 minutes however author did not feel safe leaving pt in recliner) Nurse Communication: Mobility status;Precautions PT Visit Diagnosis: Unsteadiness on feet (R26.81);Other abnormalities of gait and mobility (R26.89);Muscle weakness (generalized)  (M62.81);Difficulty in walking, not elsewhere classified (R26.2);Hemiplegia and hemiparesis Hemiplegia - Right/Left: Right Hemiplegia - dominant/non-dominant: Dominant Hemiplegia - caused by: Cerebral infarction     Time: 9147-8295 PT Time Calculation (min) (ACUTE ONLY): 26 min  Charges:    $Therapeutic Activity: 8-22 mins $Neuromuscular Re-education: 8-22 mins PT General Charges $$ ACUTE PT VISIT: 1 Visit                     Jetta Lout PTA 11/25/23, 3:45 PM

## 2023-11-25 NOTE — Progress Notes (Signed)
   NAME:  Michele House, MRN:  [id number], DOB:  05/19/1956, LOS: 54 ADMISSION DATE:  10/02/2023   History of Present Illness:  Case of a 68 year old female patient with a past medical history of COPD with chronic respiratory failure on 2 L nasal cannula, hypertension, type 2 diabetes, heart failure with preserved EF who has been here for a month.  Initially presented with altered mental status encephalopathy.  Had an extensive workup including brain MRI which was negative for any acute intracranial abnormality initially.  However unfortunately course complicated by acute CVA with repeat MRI showing multiple acute infarcts in the high bilateral posterior frontal and parietal lobes.  She recovered from that but course further complicated by waxing and waning mental status which prompted an LP to rule out encephalitis and has been unremarkable so far.  She was found to have right upper lobe lung nodule which was concerning for malignancy and underwent robotic assisted navigational bronchoscopy with biopsy showing lung cancer.     Significant Hospital Events: Including procedures, antibiotic start and stop dates in addition to other pertinent events   11/25/23- patient seen at bedside.  Shed doing better today, smiling during eval.  She remains on room air.  At this point she's been stable from pulmonary perspective and has medical oncology appt for lung cancer.  I can see her anytime if needed but her other issues such as acute CVA are still being managed with inpatient rehab scheduled.  PCCM will sign off and will schedule appt to see her in pulmonology clinic.   Objective   Blood pressure 108/63, pulse 98, temperature 97.7 F (36.5 C), resp. rate 18, height 5' (1.524 m), weight 67.7 kg, last menstrual period 12/24/1998, SpO2 91%.        Intake/Output Summary (Last 24 hours) at 11/25/2023 1236 Last data filed at 11/25/2023 1049 Gross per 24 hour  Intake 480 ml  Output 500 ml  Net -20 ml      Filed Weights   11/23/23 0500 11/24/23 0500 11/25/23 0304  Weight: 66.4 kg 65.5 kg 67.7 kg    Examination: General: Awake, follows verbal communication HENT: Supple neck reactive pupils  Lungs: mild wheezing b/l Cardiovascular: Normal S1, Normal S2, RRR  Abdomen: Soft, non tender, non distended, +BS  Extremities: Warm and well perfused no edema.   Labs and imaging were reviewed.   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  This has been sent for second opinion to be reviewed.  I think its most likely primary lung cancer.  S/p lung biopsy- with NSCLS - reported to Dr Alena Bills oncology   Best Practice (right click and "Reselect all SmartList Selections" daily)   Diet/type: NPO DVT prophylaxis SCD Pressure ulcer(s): N/A GI prophylaxis: PPI Lines: Central line Foley:  Yes, and it is still needed Code Status:  full code     Vida Rigger, M.D.  Pulmonary & Critical Care Medicine

## 2023-11-25 NOTE — TOC Progression Note (Signed)
Transition of Care [facility]) - Progression Note    Patient Details  Name: Michele House MRN: [id number] Date of Birth: 02-23-1956  Transition of Care [facility]) CM/SW Contact  Garret Reddish, RN Phone Number: 11/25/2023, 9:35 AM  Clinical Narrative:    Chart reviewed.  I have spoke with the insurance an appeal has not been started.    I have spoke with Mrs.Marin Roberts and she informs me that she attempted to call yesterday when she got off and the payor was closed.  Gunnar Fusi informs me that she is going to call on her lunch break today.  TOC will continue to follow for discharge planning.      Expected Discharge Plan: Skilled Nursing Facility Barriers to Discharge: Continued Medical Work up  Expected Discharge Plan and Services         Expected Discharge Date: 11/09/23                                     Social Determinants of Health (SDOH) Interventions SDOH Screenings   Food Insecurity: Patient Unable To Answer (10/03/2023)  Recent Concern: Food Insecurity - Food Insecurity Present (09/24/2023)  Housing: High Risk (10/03/2023)  Transportation Needs: Patient Unable To Answer (10/03/2023)  Recent Concern: Transportation Needs - Unmet Transportation Needs (09/24/2023)  Utilities: Patient Unable To Answer (10/03/2023)  Social Connections: Patient Declined (11/02/2023)  Tobacco Use: Medium Risk (11/12/2023)    Readmission Risk Interventions     No data to display

## 2023-11-26 DIAGNOSIS — G9341 Metabolic encephalopathy: Secondary | ICD-10-CM | POA: Diagnosis not present

## 2023-11-26 LAB — CBC
HCT: 30.8 % — ABNORMAL LOW (ref 36.0–46.0)
Hemoglobin: 9.9 g/dL — ABNORMAL LOW (ref 12.0–15.0)
MCH: 28.7 pg (ref 26.0–34.0)
MCHC: 32.1 g/dL (ref 30.0–36.0)
MCV: 89.3 fL (ref 80.0–100.0)
Platelets: 254 10*3/uL (ref 150–400)
RBC: 3.45 MIL/uL — ABNORMAL LOW (ref 3.87–5.11)
RDW: 15.7 % — ABNORMAL HIGH (ref 11.5–15.5)
WBC: 4.6 10*3/uL (ref 4.0–10.5)
nRBC: 0 % (ref 0.0–0.2)

## 2023-11-26 LAB — BASIC METABOLIC PANEL
Anion gap: 10 (ref 5–15)
BUN: 21 mg/dL (ref 8–23)
CO2: 29 mmol/L (ref 22–32)
Calcium: 9.1 mg/dL (ref 8.9–10.3)
Chloride: 102 mmol/L (ref 98–111)
Creatinine, Ser: 0.65 mg/dL (ref 0.44–1.00)
GFR, Estimated: >60 mL/min (ref >60)
Glucose, Bld: 143 mg/dL — ABNORMAL HIGH (ref 70–99)
Potassium: 3.5 mmol/L (ref 3.5–5.1)
Sodium: 141 mmol/L (ref 135–145)

## 2023-11-26 LAB — GLUCOSE, CAPILLARY
Glucose-Capillary: 169 mg/dL — ABNORMAL HIGH (ref 70–99)
Glucose-Capillary: 218 mg/dL — ABNORMAL HIGH (ref 70–99)
Glucose-Capillary: 223 mg/dL — ABNORMAL HIGH (ref 70–99)
Glucose-Capillary: 133 mg/dL — ABNORMAL HIGH (ref 70–99)
Glucose-Capillary: 139 mg/dL — ABNORMAL HIGH (ref 70–99)
Glucose-Capillary: 90 mg/dL (ref 70–99)

## 2023-11-26 NOTE — Evaluation (Signed)
Occupational Therapy Re-Evaluation Patient Details Name: Michele House MRN: [id number] DOB: October 31, 1956 Today's Date: 11/26/2023   History of Present Illness Patient is a 68 year old with prolonged hospital stay, originally admitted with AMS. 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 She developed respiratory distress requiring mechanical ventilation. Self extubated 12/27 with re-intubation and extubation on 12/30. Pt also found with RLE DVT status post IVC filter placement given bleeding anticoagulation contraindicated. Seizure on 1/8 with transfer to 2nd floor.   Clinical Impression   Pt seen for OT re-evaluation and cotx with PT to optimize safety with ADL/mobility attempts. Pt received supine in bed, malpositioned for self feeding and noted to have had a malfunction of the pure wick system leading to wet linens and gown. Pt required MAX A for rolling side to side, MAX A for pericare and LB bathing, and required hoyer lift for transfer to the recliner. Pillows required to position under RUE to support joint protection and optimal sitting balance for self feeding. Pt required set up for items. MAX A for attempts to comb her hair but tangles were too difficult to comb through and pt declined further attempts. Pt noted with some RUE movement today an improvement from previous sessions. Pt continues to benefit from skilled OT services to maximize functional independence and safety.       If plan is discharge home, recommend the following: Assistance with cooking/housework;Assist for transportation;Help with stairs or ramp for entrance;Direct supervision/assist for medications management;Supervision due to cognitive status;Direct supervision/assist for financial management;Two people to help with walking and/or transfers;Two people to help with bathing/dressing/bathroom    Functional Status Assessment  Patient has had a recent decline in their functional status and  demonstrates the ability to make significant improvements in function in a reasonable and predictable amount of time.  Equipment Recommendations  Other (comment) (defer)    Recommendations for Other Services       Precautions / Restrictions Precautions Precautions: Fall Precaution Comments: right side hemiparesis Restrictions Weight Bearing Restrictions Per Provider Order: No      Mobility Bed Mobility Overal bed mobility: Needs Assistance Bed Mobility: Rolling Rolling: Max assist              Transfers Overall transfer level: Needs assistance Equipment used: Ambulation equipment used Transfers: Bed to chair/wheelchair/BSC Sit to Stand: Via lift equipment           General transfer comment: Viking lift to/from chair to check tolerance - does well with no discomfort Transfer via Lift Equipment: Maximove    Balance Overall balance assessment: Needs assistance Sitting-balance support: Bilateral upper extremity supported, No upper extremity supported, Feet supported Sitting balance-Leahy Scale: Poor Sitting balance - Comments: pillows props used in chair   Standing balance support: During functional activity Standing balance-Leahy Scale: Zero                             ADL either performed or assessed with clinical judgement   ADL Overall ADL's : Needs assistance/impaired Eating/Feeding: Sitting;Cueing for sequencing;Set up Eating/Feeding Details (indicate cue type and reason): set up using non-dom L hand, WBing through RUE on arm rest of chair wiht supports Grooming: Sitting;Brushing hair Grooming Details (indicate cue type and reason): MAX A to attempt combing hair but pt declined to continue 2/2 tangles         Upper Body Dressing : Sitting;Moderate assistance  Vision         Perception         Praxis         Pertinent Vitals/Pain Pain Assessment Pain Assessment: No/denies pain      Extremity/Trunk Assessment Upper Extremity Assessment Upper Extremity Assessment: RUE deficits/detail RUE Deficits / Details: hemi weakness proximally through shoulder ranges, elbow, and grip weakness RUE: Subluxation noted RUE Sensation: decreased light touch RUE Coordination: decreased fine motor;decreased gross motor   Lower Extremity Assessment Lower Extremity Assessment: Defer to PT evaluation       Communication Communication Communication: Difficulty following commands/understanding Following commands: Follows one step commands consistently   Cognition Arousal: Alert Behavior During Therapy: WFL for tasks assessed/performed Overall Cognitive Status: No family/caregiver present to determine baseline cognitive functioning                                 General Comments: Pt alert, pleasant, and eager to get to the recliner in order to eat her breakfast. Pt able to follow commands well with increased time and cues     General Comments       Exercises     Shoulder Instructions      Home Living Family/patient expects to be discharged to:: Private residence Living Arrangements: Children Available Help at Discharge: Family;Available PRN/intermittently Type of Home: Mobile home Home Access: Stairs to enter Entrance Stairs-Number of Steps: 4-5 Entrance Stairs-Rails: Right;Left;Can reach both Home Layout: One level     Bathroom Shower/Tub: Chief Strategy Officer: Standard     Home Equipment: Grab bars - tub/shower          Prior Functioning/Environment Prior Level of Function : Needs assist             Mobility Comments: Pt reports furniture walking prior to admission; unsure about falls ADLs Comments: Sits in the tub; reports using 4L "tries to", doesn't get dressed just wears a pajama gown during the day, indep with toileting, sister drives to appts, sister brings groceries, very light meal prep, reports being "all confused'  with her medications        OT Problem List: Decreased strength;Decreased cognition;Decreased safety awareness;Impaired balance (sitting and/or standing);Decreased knowledge of use of DME or AE;Obesity;Decreased activity tolerance;Impaired UE functional use      OT Treatment/Interventions: Self-care/ADL training;Therapeutic exercise;Therapeutic activities;Cognitive remediation/compensation;DME and/or AE instruction;Patient/family education;Balance training;Energy conservation;Neuromuscular education    OT Goals(Current goals can be found in the care plan section) Acute Rehab OT Goals Patient Stated Goal: to return home OT Goal Formulation: With patient Time For Goal Achievement: 12/10/23 Potential to Achieve Goals: Fair ADL Goals Pt Will Perform Lower Body Dressing: sitting/lateral leans;with adaptive equipment;with max assist;with mod assist Pt Will Perform Toileting - Clothing Manipulation and hygiene: with max assist;sitting/lateral leans  OT Frequency: Min 1X/week    Co-evaluation PT/OT/SLP Co-Evaluation/Treatment: Yes Reason for Co-Treatment: Complexity of the patient's impairments (multi-system involvement);Necessary to address cognition/behavior during functional activity;For patient/therapist safety;To address functional/ADL transfers PT goals addressed during session: Mobility/safety with mobility;Balance;Proper use of DME;Strengthening/ROM OT goals addressed during session: ADL's and self-care      AM-PAC OT "6 Clicks" Daily Activity     Outcome Measure Help from another person eating meals?: A Little Help from another person taking care of personal grooming?: A Lot Help from another person toileting, which includes using toliet, bedpan, or urinal?: Total Help from another person bathing (including washing, rinsing, drying)?: Total Help  from another person to put on and taking off regular upper body clothing?: A Lot Help from another person to put on and taking off regular  lower body clothing?: Total 6 Click Score: 10   End of Session Nurse Communication: Mobility status  Activity Tolerance: Patient tolerated treatment well Patient left: in chair;with call bell/phone within reach;with chair alarm set  OT Visit Diagnosis: Muscle weakness (generalized) (M62.81);Hemiplegia and hemiparesis Hemiplegia - Right/Left: Right Hemiplegia - caused by: Cerebral infarction                Time: 4782-9562 OT Time Calculation (min): 24 min Charges:  OT General Charges $OT Visit: 1 Visit OT Evaluation $OT Re-eval: 1 Re-eval OT Treatments $Self Care/Home Management : 8-22 mins  Arman Filter., MPH, MS, OTR/L ascom [phone] 11/26/23, 2:36 PM

## 2023-11-26 NOTE — Progress Notes (Signed)
PROGRESS NOTE    Michele House  ZOX:[id number] DOB: 1956/08/23 DOA: 10/02/2023 PCP: [facility]    Assessment & Plan:   Principal Problem:   Acute metabolic encephalopathy Active Problems:   CVA (cerebral vascular accident) (HCC)   Uncontrolled type 2 diabetes mellitus with hypoglycemia, without long-term current use of insulin (HCC)   Hypotension   Acute on chronic respiratory failure with hypoxia (HCC)   Hypernatremia   COPD exacerbation (HCC)   Lung nodule   Pneumonia   UTI (urinary tract infection)   (HFpEF) heart failure with preserved ejection fraction (HCC)   Essential hypertension   Type 2 diabetes mellitus with obesity (HCC)   Melena   Gastrointestinal hemorrhage with melena   Duodenal ulcer   Acute deep vein thrombosis (DVT) of right lower extremity (HCC)   Bilateral leg pain  Assessment and Plan: Acute metabolic encephalopathy: much improved. Completed steroid course. Ddx included autoimmune encephalitis. Delirium precautions. Avoid benzos & sedating meds   Acute CVA: with right hemiplegia: Off of Plavix because of recent acute GI bleeding. 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   Possible seizure: on evening of 11/10/23. Continue on keppra as per neuro    Acute GI bleeding: secondary to bleeding duodenal ulcer. S/p EGD on 10/28/2023 with clip placed.   S/p IR embolization on 10/30/2023. Continue on PPI   Right peroneal DVT: initially refused ultrasound of the left lower extremity because of severe pain.  Ultrasound of the right lower extremity showed right calf DVT. Due to high risk of recurrent GI bleeding given recent massive GI bleed requiring embolization by IR, unable to treat with anticoagulation. Vascular surgery placed IVC filter on  11/09/23   Hemorrhagic shock: Resolved  Hypotension: resolved    Acute blood loss anemia: H&H are labile. S/p 2 units of pRBCs transfused so far. S/p transfusion with 2 units of platelet transfusions.   Pneumonia: initially completed 5 days of IV ceftriaxone and azithromycin on 10/07/2023. Restarted on broad-spectrum antibiotics (vancomycin, ceftriaxone and Flagyl) on 10/28/2023 when she decompensated requiring transfer to the ICU. Completed abx course x 2.   Acute UTI: urine cx showed strep agalactiae.  Completed antibiotics.   Acute on chronic diastolic CHF: continue on lasix. Monitor I/Os    COPD exacerbation: completed steroid course x2. Bronchodilators prn. Pulmon recs apprec and signed off 11/25/23    Acute on chronic hypoxemic respiratory failure & chronic hypercapnic respiratory failure: was intubated on 10/28/2023 and extubated on 11/01/2023. BiPAP qhs   DM2: likely poorly controlled. Continue on glargine, SSI w/ accuchecks   Anxiety/panic attacks: severity unknown. Continue on home dose of sertraline   Non-small cell carcinoma -- pt found to have 1.3 cm right upper lung spiculated nodule: S/p bronchoscopy on 10/20/2023.  Pathology showed rare atypical cells, but unclear if malignancy and findings more consistent with reactive bronchial cells with acute inflammation. Lung biopsy repeated 1/10, results today per Dr. Alena Bills note. F/u outpatient w/ onco    Hypothyroidism: continue on home dose of levothyroxine.  Daughter requested 1/11 to have thyroid labs repeated, citing concern for possible Hashimoto's Encephalopathy causing patient's persistent mental status changes.  Advised acute setting and recent illnesses often make TSH and T4 abnormal and not very useful clinically. Negative anti-TPO Ab (obtained at daughter's request as she has hashimoto's)   Physical debility :  PT/OT recs SNF. Pt's insurance denied SNF. Pt's daughter appealed the denial       DVT prophylaxis: SCDs Code  Status: DNR Family Communication:  Disposition Plan: pt's insurance denied SNF placement. Pt's daughter appealed the denial and now waiting hear back from pt's insurance   Level of care: Telemetry Medical  Status is: Inpatient Remains inpatient appropriate because: medically stable. Needs SNF placement but pt's insurance denied SNF and pt's daughter appealed the denial. Now waiting to hear back from pt's insurance     Consultants:  GI Pulmon   Procedures:   Antimicrobials:    Subjective: Pt c/o fatigue   Objective: Vitals:   11/26/23 0307 11/26/23 0333 11/26/23 0719 11/26/23 0732  BP:  (!) 112/59  124/71  Pulse:  93  92  Resp:  18  16  Temp:  97.7 F (36.5 C)  97.8 F (36.6 C)  TempSrc:      SpO2:  93% 93% 100%  Weight: 66.7 kg     Height:        Intake/Output Summary (Last 24 hours) at 11/26/2023 0841 Last data filed at 11/25/2023 2122 Gross per 24 hour  Intake 360 ml  Output --  Net 360 ml   Filed Weights   11/24/23 0500 11/25/23 0304 11/26/23 0307  Weight: 65.5 kg 67.7 kg 66.7 kg    Examination:  General exam: Appears calm & comfortable  Respiratory system: decreased breath sounds b/l  Cardiovascular system: S1 & S2+. No rubs or clicks  Gastrointestinal system: abd is soft, NT, ND & hypoactive bowel sounds Central nervous system: alert & oriented. Moves all extremities  Psychiatry: judgement and insight appears poor. Flat mood and affect     Data Reviewed: I have personally reviewed following labs and imaging studies  CBC: Recent Labs  Lab 11/20/23 1304 11/26/23 0538  WBC 4.9 4.6  HGB 10.4* 9.9*  HCT 32.0* 30.8*  MCV 91.2 89.3  PLT 248 254   Basic Metabolic Panel: Recent Labs  Lab 11/20/23 1304 11/26/23 0538  NA 140 141  K 3.5 3.5  CL 99 102  CO2 29 29  GLUCOSE 181* 143*  BUN 17 21  CREATININE 0.86 0.65  CALCIUM 9.1 9.1  MG 2.1  --   PHOS 3.5  --    GFR: Estimated Creatinine Clearance: 56.6 mL/min (by C-G formula based on SCr  of 0.65 mg/dL). Liver Function Tests: No results for input(s): "AST", "ALT", "ALKPHOS", "BILITOT", "PROT", "ALBUMIN" in the last 168 hours. No results for input(s): "LIPASE", "AMYLASE" in the last 168 hours. No results for input(s): "AMMONIA" in the last 168 hours. Coagulation Profile: No results for input(s): "INR", "PROTIME" in the last 168 hours. Cardiac Enzymes: No results for input(s): "CKTOTAL", "CKMB", "CKMBINDEX", "TROPONINI" in the last 168 hours. BNP (last 3 results) No results for input(s): "PROBNP" in the last 8760 hours. HbA1C: No results for input(s): "HGBA1C" in the last 72 hours. CBG: Recent Labs  Lab 11/25/23 0912 11/25/23 1148 11/25/23 1658 11/25/23 1951 11/26/23 0741  GLUCAP 243* 173* 182* 97 169*   Lipid Profile: No results for input(s): "CHOL", "HDL", "LDLCALC", "TRIG", "CHOLHDL", "LDLDIRECT" in the last 72 hours. Thyroid Function Tests: No results for input(s): "TSH", "T4TOTAL", "FREET4", "T3FREE", "THYROIDAB" in the last 72 hours. Anemia Panel: No results for input(s): "VITAMINB12", "FOLATE", "FERRITIN", "TIBC", "IRON", "RETICCTPCT" in the last 72 hours. Sepsis Labs: No results for input(s): "PROCALCITON", "LATICACIDVEN" in the last 168 hours.  No results found for this or any previous visit (  from the past 240 hours).       Radiology Studies: No results found.      Scheduled Meds:  arformoterol  15 mcg Nebulization BID   docusate sodium  100 mg Oral BID   furosemide  20 mg Oral Daily   insulin aspart  0-5 Units Subcutaneous QHS   insulin aspart  0-9 Units Subcutaneous TID WC   insulin aspart  4 Units Subcutaneous TID WC   insulin glargine-yfgn  18 Units Subcutaneous Daily   ipratropium-albuterol  3 mL Nebulization BID   levETIRAcetam  500 mg Oral BID   levothyroxine  150 mcg Oral Q0600   multivitamin with minerals  1 tablet Oral Daily   mouth rinse  15 mL Mouth Rinse BID   pantoprazole  40 mg Oral BID AC   polyethylene glycol  17 g  Oral Daily   rOPINIRole  4 mg Oral QHS   senna-docusate  1 tablet Oral Daily   sertraline  50 mg Oral Daily   Continuous Infusions:   LOS: 55 days      Charise Killian, MD [facility] Pager [phone]  If 7PM-7AM, please contact night-coverage [email or web address] 11/26/2023, 8:41 AM

## 2023-11-26 NOTE — Progress Notes (Signed)
Physical Therapy Treatment Patient Details Name: Michele House MRN: [id number] DOB: 1956-05-13 Today's Date: 11/26/2023   History of Present Illness Patient is a 68 year old with prolonged hospital stay, originally admitted with AMS. 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 She developed respiratory distress requiring mechanical ventilation. Self extubated 12/27 with re-intubation and extubation on 12/30. Pt also found with RLE DVT status post IVC filter placement given bleeding anticoagulation contraindicated. Seizure on 1/8 with transfer to 2nd floor.    PT Comments  Pt tolerated out of bed to chair x 3 hours today before c/o discomfort from sitting.  She is checked frequently during the morning with no sliding or issues noted from sitting.  Positioned on right side in bed.    If plan is discharge home, recommend the following: Two people to help with walking and/or transfers;Two people to help with bathing/dressing/bathroom;Assistance with cooking/housework;Direct supervision/assist for medications management;Assistance with feeding;Direct supervision/assist for financial management;Assist for transportation;Help with stairs or ramp for entrance;Supervision due to cognitive status   Can travel by private vehicle        Equipment Recommendations       Recommendations for Other Services       Precautions / Restrictions Precautions Precautions: Fall Precaution Comments: right side hemiparesis Restrictions Weight Bearing Restrictions Per Provider Order: No     Mobility  Bed Mobility Overal bed mobility: Needs Assistance Bed Mobility: Rolling Rolling: Max assist           Patient Response: Cooperative  Transfers Overall transfer level: Needs assistance   Transfers: Bed to chair/wheelchair/BSC Sit to Stand: Via lift equipment           General transfer comment: Viking lift to/from chair to check tolerance - does well with no  discomfort Transfer via Lift Equipment: Maximove  Ambulation/Gait                   Stairs             Wheelchair Mobility     Tilt Bed Tilt Bed Patient Response: Cooperative  Modified Rankin (Stroke Patients Only)       Balance Overall balance assessment: Needs assistance Sitting-balance support: Bilateral upper extremity supported, No upper extremity supported, Feet supported Sitting balance-Leahy Scale: Poor Sitting balance - Comments: pillows props used in chair                                    Cognition Arousal: Alert Behavior During Therapy: WFL for tasks assessed/performed Overall Cognitive Status: No family/caregiver present to determine baseline cognitive functioning                                          Exercises Other Exercises Other Exercises: rolling left/right for care with max a and hand over hand cues for hand placements    General Comments        Pertinent Vitals/Pain Pain Assessment Pain Assessment: Faces Faces Pain Scale: Hurts little more Pain Location: bottom from sitting in chair Pain Descriptors / Indicators: Discomfort Pain Intervention(s): Repositioned    Home Living                          Prior Function  PT Goals (current goals can now be found in the care plan section) Progress towards PT goals: Progressing toward goals    Frequency    Min 1X/week      PT Plan      Co-evaluation              AM-PAC PT "6 Clicks" Mobility   Outcome Measure  Help needed turning from your back to your side while in a flat bed without using bedrails?: A Lot Help needed moving from lying on your back to sitting on the side of a flat bed without using bedrails?: A Lot Help needed moving to and from a bed to a chair (including a wheelchair)?: Total Help needed standing up from a chair using your arms (e.g., wheelchair or bedside chair)?: Total Help needed to  walk in hospital room?: Total Help needed climbing 3-5 steps with a railing? : Total 6 Click Score: 8    End of Session Equipment Utilized During Treatment: Gait belt Activity Tolerance: Patient tolerated treatment well Patient left: with bed alarm set;in bed;with call bell/phone within reach;with family/visitor present Nurse Communication: Mobility status PT Visit Diagnosis: Unsteadiness on feet (R26.81);Other abnormalities of gait and mobility (R26.89);Muscle weakness (generalized) (M62.81);Difficulty in walking, not elsewhere classified (R26.2);Hemiplegia and hemiparesis Hemiplegia - Right/Left: Right Hemiplegia - dominant/non-dominant: Dominant Hemiplegia - caused by: Cerebral infarction     Time: 1212-1225 PT Time Calculation (min) (ACUTE ONLY): 13 min  Charges:    $Therapeutic Activity: 8-22 mins PT General Charges $$ ACUTE PT VISIT: 1 Visit                   Danielle Dess, PTA 11/26/23, 12:32 PM

## 2023-11-26 NOTE — TOC Progression Note (Signed)
Transition of Care [facility]) - Progression Note    Patient Details  Name: Michele House MRN: [id number] Date of Birth: 24-Nov-1955  [facility] CM/SW Contact  Garret Reddish, RN Phone Number: 11/26/2023, 9:16 AM  Clinical Narrative:    Chart reviewed.  I spoke with [facility] plan and they informed me that an appeal had been called on yesterday by patient's family.  Fast track appeal will take up to 72 hours.  Dr. Mayford Knife notified.    TOC will continue to follow for discharge planning.   Expected Discharge Plan: Skilled Nursing Facility Barriers to Discharge: Continued Medical Work up  Expected Discharge Plan and Services         Expected Discharge Date: 11/09/23                                     Social Determinants of Health (SDOH) Interventions SDOH Screenings   Food Insecurity: Patient Unable To Answer (10/03/2023)  Recent Concern: Food Insecurity - Food Insecurity Present (09/24/2023)  Housing: High Risk (10/03/2023)  Transportation Needs: Patient Unable To Answer (10/03/2023)  Recent Concern: Transportation Needs - Unmet Transportation Needs (09/24/2023)  Utilities: Patient Unable To Answer (10/03/2023)  Social Connections: Patient Declined (11/02/2023)  Tobacco Use: Medium Risk (11/12/2023)    Readmission Risk Interventions     No data to display

## 2023-11-26 NOTE — Progress Notes (Signed)
Physical Therapy Treatment Patient Details Name: Michele House MRN: [id number] DOB: 11-24-1955 Today's Date: 11/26/2023   History of Present Illness Patient is a 68 year old with prolonged hospital stay, originally admitted with AMS. 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 She developed respiratory distress requiring mechanical ventilation. Self extubated 12/27 with re-intubation and extubation on 12/30. Pt also found with RLE DVT status post IVC filter placement given bleeding anticoagulation contraindicated. Seizure on 1/8 with transfer to 2nd floor.    PT Comments  Pt in bed,  wet.  Care provided with max a with hand over hand assist to place hands on rail to assist.  She is able tolerate Viking lift transfer to chair.  OT remains in room for feeding.  Pillow supports used for positioning.   If plan is discharge home, recommend the following: Two people to help with walking and/or transfers;Two people to help with bathing/dressing/bathroom;Assistance with cooking/housework;Direct supervision/assist for medications management;Assistance with feeding;Direct supervision/assist for financial management;Assist for transportation;Help with stairs or ramp for entrance;Supervision due to cognitive status   Can travel by private vehicle        Equipment Recommendations       Recommendations for Other Services       Precautions / Restrictions Precautions Precautions: Fall Precaution Comments: right side hemiparesis Restrictions Weight Bearing Restrictions Per Provider Order: No     Mobility  Bed Mobility Overal bed mobility: Needs Assistance Bed Mobility: Rolling Rolling: Max assist           Patient Response: Cooperative  Transfers Overall transfer level: Needs assistance   Transfers: Bed to chair/wheelchair/BSC Sit to Stand: Via lift equipment           General transfer comment: Viking lift to/from chair to check tolerance - does  well with no discomfort Transfer via Lift Equipment: Maximove  Ambulation/Gait                   Stairs             Wheelchair Mobility     Tilt Bed Tilt Bed Patient Response: Cooperative  Modified Rankin (Stroke Patients Only)       Balance Overall balance assessment: Needs assistance Sitting-balance support: Bilateral upper extremity supported, No upper extremity supported, Feet supported Sitting balance-Leahy Scale: Poor Sitting balance - Comments: pillows props used in chair                                    Cognition Arousal: Alert Behavior During Therapy: WFL for tasks assessed/performed Overall Cognitive Status: No family/caregiver present to determine baseline cognitive functioning                                          Exercises Other Exercises Other Exercises: rolling left/right for care with max a and hand over hand cues for hand placements    General Comments        Pertinent Vitals/Pain Pain Assessment Pain Assessment: No/denies pain Pain Descriptors / Indicators: Discomfort Pain Intervention(s): Limited activity within patient's tolerance, Monitored during session    Home Living                          Prior Function  PT Goals (current goals can now be found in the care plan section) Progress towards PT goals: Progressing toward goals    Frequency    Min 1X/week      PT Plan      Co-evaluation              AM-PAC PT "6 Clicks" Mobility   Outcome Measure  Help needed turning from your back to your side while in a flat bed without using bedrails?: A Lot Help needed moving from lying on your back to sitting on the side of a flat bed without using bedrails?: A Lot Help needed moving to and from a bed to a chair (including a wheelchair)?: Total Help needed standing up from a chair using your arms (e.g., wheelchair or bedside chair)?: Total Help needed to  walk in hospital room?: Total Help needed climbing 3-5 steps with a railing? : Total 6 Click Score: 8    End of Session   Activity Tolerance: Patient tolerated treatment well Patient left: in chair;with call bell/phone within reach;with chair alarm set Nurse Communication: Mobility status PT Visit Diagnosis: Unsteadiness on feet (R26.81);Other abnormalities of gait and mobility (R26.89);Muscle weakness (generalized) (M62.81);Difficulty in walking, not elsewhere classified (R26.2);Hemiplegia and hemiparesis Hemiplegia - Right/Left: Right Hemiplegia - dominant/non-dominant: Dominant Hemiplegia - caused by: Cerebral infarction     Time: 0905-0922 PT Time Calculation (min) (ACUTE ONLY): 17 min  Charges:    $Therapeutic Activity: 8-22 mins PT General Charges $$ ACUTE PT VISIT: 1 Visit                   Danielle Dess, PTA 11/26/23, 12:29 PM

## 2023-11-26 NOTE — Plan of Care (Signed)
Problem: Education: Goal: Ability to describe self-care measures that may prevent or decrease complications (Diabetes Survival Skills Education) will improve Outcome: Progressing   Problem: Coping: Goal: Ability to adjust to condition or change in health will improve Outcome: Progressing   Problem: Fluid Volume: Goal: Ability to maintain a balanced intake and output will improve Outcome: Progressing   Problem: Health Behavior/Discharge Planning: Goal: Ability to identify and utilize available resources and services will improve Outcome: Progressing Goal: Ability to manage health-related needs will improve Outcome: Progressing   Problem: Metabolic: Goal: Ability to maintain appropriate glucose levels will improve Outcome: Progressing   Problem: Nutritional: Goal: Maintenance of adequate nutrition will improve Outcome: Progressing Goal: Progress toward achieving an optimal weight will improve Outcome: Progressing   Problem: Skin Integrity: Goal: Risk for impaired skin integrity will decrease Outcome: Progressing   Problem: Tissue Perfusion: Goal: Adequacy of tissue perfusion will improve Outcome: Progressing   Problem: Education: Goal: Knowledge of General Education information will improve Description: Including pain rating scale, medication(s)/side effects and non-pharmacologic comfort measures Outcome: Progressing   Problem: Health Behavior/Discharge Planning: Goal: Ability to manage health-related needs will improve Outcome: Progressing   Problem: Clinical Measurements: Goal: Ability to maintain clinical measurements within normal limits will improve Outcome: Progressing Goal: Will remain free from infection Outcome: Progressing Goal: Diagnostic test results will improve Outcome: Progressing Goal: Respiratory complications will improve Outcome: Progressing Goal: Cardiovascular complication will be avoided Outcome: Progressing   Problem: Activity: Goal:  Risk for activity intolerance will decrease Outcome: Progressing   Problem: Nutrition: Goal: Adequate nutrition will be maintained Outcome: Progressing   Problem: Coping: Goal: Level of anxiety will decrease Outcome: Progressing   Problem: Elimination: Goal: Will not experience complications related to bowel motility Outcome: Progressing Goal: Will not experience complications related to urinary retention Outcome: Progressing   Problem: Pain Management: Goal: General experience of comfort will improve Outcome: Progressing   Problem: Safety: Goal: Ability to remain free from injury will improve Outcome: Progressing   Problem: Skin Integrity: Goal: Risk for impaired skin integrity will decrease Outcome: Progressing   Problem: Education: Goal: Knowledge of disease or condition will improve Outcome: Progressing Goal: Knowledge of the prescribed therapeutic regimen will improve Outcome: Progressing Goal: Individualized Educational Video(s) Outcome: Progressing   Problem: Activity: Goal: Ability to tolerate increased activity will improve Outcome: Progressing Goal: Will verbalize the importance of balancing activity with adequate rest periods Outcome: Progressing   Problem: Respiratory: Goal: Ability to maintain a clear airway will improve Outcome: Progressing Goal: Levels of oxygenation will improve Outcome: Progressing Goal: Ability to maintain adequate ventilation will improve Outcome: Progressing   Problem: Activity: Goal: Ability to tolerate increased activity will improve Outcome: Progressing   Problem: Clinical Measurements: Goal: Ability to maintain a body temperature in the normal range will improve Outcome: Progressing   Problem: Respiratory: Goal: Ability to maintain adequate ventilation will improve Outcome: Progressing Goal: Ability to maintain a clear airway will improve Outcome: Progressing   Problem: Education: Goal: Knowledge of disease or  condition will improve Outcome: Progressing Goal: Knowledge of secondary prevention will improve (MUST DOCUMENT ALL) Outcome: Progressing Goal: Knowledge of patient specific risk factors will improve Loraine Leriche N/A or DELETE if not current risk factor) Outcome: Progressing   Problem: Ischemic Stroke/TIA Tissue Perfusion: Goal: Complications of ischemic stroke/TIA will be minimized Outcome: Progressing   Problem: Coping: Goal: Will verbalize positive feelings about self Outcome: Progressing Goal: Will identify appropriate support needs Outcome: Progressing   Problem: Health Behavior/Discharge Planning: Goal: Ability to manage  health-related needs will improve Outcome: Progressing Goal: Goals will be collaboratively established with patient/family Outcome: Progressing   Problem: Self-Care: Goal: Ability to participate in self-care as condition permits will improve Outcome: Progressing Goal: Verbalization of feelings and concerns over difficulty with self-care will improve Outcome: Progressing Goal: Ability to communicate needs accurately will improve Outcome: Progressing   Problem: Education: Goal: Knowledge of disease or condition will improve Outcome: Progressing Goal: Knowledge of secondary prevention will improve (MUST DOCUMENT ALL) Outcome: Progressing Goal: Knowledge of patient specific risk factors will improve Loraine Leriche N/A or DELETE if not current risk factor) Outcome: Progressing   Problem: Ischemic Stroke/TIA Tissue Perfusion: Goal: Complications of ischemic stroke/TIA will be minimized Outcome: Progressing   Problem: Coping: Goal: Will verbalize positive feelings about self Outcome: Progressing Goal: Will identify appropriate support needs Outcome: Progressing   Problem: Health Behavior/Discharge Planning: Goal: Ability to manage health-related needs will improve Outcome: Progressing Goal: Goals will be collaboratively established with patient/family Outcome:  Progressing   Problem: Self-Care: Goal: Ability to participate in self-care as condition permits will improve Outcome: Progressing Goal: Verbalization of feelings and concerns over difficulty with self-care will improve Outcome: Progressing Goal: Ability to communicate needs accurately will improve Outcome: Progressing   Problem: Nutrition: Goal: Risk of aspiration will decrease Outcome: Progressing Goal: Dietary intake will improve Outcome: Progressing   Problem: Education: Goal: Understanding of CV disease, CV risk reduction, and recovery process will improve Outcome: Progressing Goal: Individualized Educational Video(s) Outcome: Progressing   Problem: Activity: Goal: Ability to return to baseline activity level will improve Outcome: Progressing   Problem: Cardiovascular: Goal: Ability to achieve and maintain adequate cardiovascular perfusion will improve Outcome: Progressing Goal: Vascular access site(s) Level 0-1 will be maintained Outcome: Progressing   Problem: Health Behavior/Discharge Planning: Goal: Ability to safely manage health-related needs after discharge will improve Outcome: Progressing   Problem: Activity: Goal: Ability to tolerate increased activity will improve Outcome: Progressing   Problem: Respiratory: Goal: Ability to maintain a clear airway and adequate ventilation will improve Outcome: Progressing

## 2023-11-27 DIAGNOSIS — G9341 Metabolic encephalopathy: Secondary | ICD-10-CM | POA: Diagnosis not present

## 2023-11-27 LAB — GLUCOSE, CAPILLARY
Glucose-Capillary: 176 mg/dL — ABNORMAL HIGH (ref 70–99)
Glucose-Capillary: 285 mg/dL — ABNORMAL HIGH (ref 70–99)
Glucose-Capillary: 176 mg/dL — ABNORMAL HIGH (ref 70–99)
Glucose-Capillary: 131 mg/dL — ABNORMAL HIGH (ref 70–99)
Glucose-Capillary: 170 mg/dL — ABNORMAL HIGH (ref 70–99)
Glucose-Capillary: 47 mg/dL — ABNORMAL LOW (ref 70–99)
Glucose-Capillary: 133 mg/dL — ABNORMAL HIGH (ref 70–99)

## 2023-11-27 NOTE — Plan of Care (Signed)
Problem: Fluid Volume: Goal: Ability to maintain a balanced intake and output will improve Outcome: Progressing   Problem: Health Behavior/Discharge Planning: Goal: Ability to identify and utilize available resources and services will improve Outcome: Progressing Goal: Ability to manage health-related needs will improve Outcome: Progressing   Problem: Metabolic: Goal: Ability to maintain appropriate glucose levels will improve Outcome: Progressing   Problem: Nutritional: Goal: Maintenance of adequate nutrition will improve Outcome: Progressing Goal: Progress toward achieving an optimal weight will improve Outcome: Progressing   Problem: Skin Integrity: Goal: Risk for impaired skin integrity will decrease Outcome: Progressing   Problem: Tissue Perfusion: Goal: Adequacy of tissue perfusion will improve Outcome: Progressing   Problem: Health Behavior/Discharge Planning: Goal: Ability to manage health-related needs will improve Outcome: Progressing   Problem: Clinical Measurements: Goal: Ability to maintain clinical measurements within normal limits will improve Outcome: Progressing Goal: Will remain free from infection Outcome: Progressing Goal: Diagnostic test results will improve Outcome: Progressing Goal: Respiratory complications will improve Outcome: Progressing Goal: Cardiovascular complication will be avoided Outcome: Progressing   Problem: Activity: Goal: Risk for activity intolerance will decrease Outcome: Progressing   Problem: Nutrition: Goal: Adequate nutrition will be maintained Outcome: Progressing   Problem: Coping: Goal: Level of anxiety will decrease Outcome: Progressing   Problem: Elimination: Goal: Will not experience complications related to bowel motility Outcome: Progressing Goal: Will not experience complications related to urinary retention Outcome: Progressing   Problem: Pain Management: Goal: General experience of comfort will  improve Outcome: Progressing   Problem: Safety: Goal: Ability to remain free from injury will improve Outcome: Progressing   Problem: Skin Integrity: Goal: Risk for impaired skin integrity will decrease Outcome: Progressing   Problem: Activity: Goal: Ability to tolerate increased activity will improve Outcome: Progressing Goal: Will verbalize the importance of balancing activity with adequate rest periods Outcome: Progressing   Problem: Respiratory: Goal: Ability to maintain a clear airway will improve Outcome: Progressing Goal: Levels of oxygenation will improve Outcome: Progressing Goal: Ability to maintain adequate ventilation will improve Outcome: Progressing   Problem: Activity: Goal: Ability to tolerate increased activity will improve Outcome: Progressing   Problem: Clinical Measurements: Goal: Ability to maintain a body temperature in the normal range will improve Outcome: Progressing   Problem: Respiratory: Goal: Ability to maintain adequate ventilation will improve Outcome: Progressing Goal: Ability to maintain a clear airway will improve Outcome: Progressing   Problem: Education: Goal: Knowledge of disease or condition will improve Outcome: Progressing Goal: Knowledge of secondary prevention will improve (MUST DOCUMENT ALL) Outcome: Progressing Goal: Knowledge of patient specific risk factors will improve Loraine Leriche N/A or DELETE if not current risk factor) Outcome: Progressing   Problem: Ischemic Stroke/TIA Tissue Perfusion: Goal: Complications of ischemic stroke/TIA will be minimized Outcome: Progressing   Problem: Coping: Goal: Will verbalize positive feelings about self Outcome: Progressing Goal: Will identify appropriate support needs Outcome: Progressing   Problem: Health Behavior/Discharge Planning: Goal: Ability to manage health-related needs will improve Outcome: Progressing Goal: Goals will be collaboratively established with  patient/family Outcome: Progressing   Problem: Self-Care: Goal: Ability to participate in self-care as condition permits will improve Outcome: Progressing Goal: Verbalization of feelings and concerns over difficulty with self-care will improve Outcome: Progressing Goal: Ability to communicate needs accurately will improve Outcome: Progressing   Problem: Nutrition: Goal: Risk of aspiration will decrease Outcome: Progressing Goal: Dietary intake will improve Outcome: Progressing   Problem: Education: Goal: Knowledge of disease or condition will improve Outcome: Progressing Goal: Knowledge of secondary prevention will improve (MUST  DOCUMENT ALL) Outcome: Progressing Goal: Knowledge of patient specific risk factors will improve Loraine Leriche N/A or DELETE if not current risk factor) Outcome: Progressing   Problem: Ischemic Stroke/TIA Tissue Perfusion: Goal: Complications of ischemic stroke/TIA will be minimized Outcome: Progressing   Problem: Coping: Goal: Will verbalize positive feelings about self Outcome: Progressing Goal: Will identify appropriate support needs Outcome: Progressing   Problem: Health Behavior/Discharge Planning: Goal: Ability to manage health-related needs will improve Outcome: Progressing Goal: Goals will be collaboratively established with patient/family Outcome: Progressing   Problem: Self-Care: Goal: Ability to participate in self-care as condition permits will improve Outcome: Progressing Goal: Verbalization of feelings and concerns over difficulty with self-care will improve Outcome: Progressing Goal: Ability to communicate needs accurately will improve Outcome: Progressing   Problem: Nutrition: Goal: Risk of aspiration will decrease Outcome: Progressing Goal: Dietary intake will improve Outcome: Progressing   Problem: Education: Goal: Understanding of CV disease, CV risk reduction, and recovery process will improve Outcome: Progressing Goal:  Individualized Educational Video(s) Outcome: Progressing   Problem: Activity: Goal: Ability to return to baseline activity level will improve Outcome: Progressing   Problem: Cardiovascular: Goal: Ability to achieve and maintain adequate cardiovascular perfusion will improve Outcome: Progressing Goal: Vascular access site(s) Level 0-1 will be maintained Outcome: Progressing   Problem: Health Behavior/Discharge Planning: Goal: Ability to safely manage health-related needs after discharge will improve Outcome: Progressing   Problem: Activity: Goal: Ability to tolerate increased activity will improve Outcome: Progressing   Problem: Respiratory: Goal: Ability to maintain a clear airway and adequate ventilation will improve Outcome: Progressing

## 2023-11-27 NOTE — Progress Notes (Signed)
PROGRESS NOTE    Michele CARNERO  ZOX:[id number] DOB: 1955/11/19 DOA: 10/02/2023 PCP: [facility]    Assessment & Plan:   Principal Problem:   Acute metabolic encephalopathy Active Problems:   CVA (cerebral vascular accident) (HCC)   Uncontrolled type 2 diabetes mellitus with hypoglycemia, without long-term current use of insulin (HCC)   Hypotension   Acute on chronic respiratory failure with hypoxia (HCC)   Hypernatremia   COPD exacerbation (HCC)   Lung nodule   Pneumonia   UTI (urinary tract infection)   (HFpEF) heart failure with preserved ejection fraction (HCC)   Essential hypertension   Type 2 diabetes mellitus with obesity (HCC)   Melena   Gastrointestinal hemorrhage with melena   Duodenal ulcer   Acute deep vein thrombosis (DVT) of right lower extremity (HCC)   Bilateral leg pain  Assessment and Plan: Acute metabolic encephalopathy: much improved. Completed steroid course. Ddx included autoimmune encephalitis. Delirium precautions. Avoid benzos & sedating meds   Acute CVA: with right hemiplegia. Off of Plavix because of recent acute GI bleeding. 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   Possible seizure: on evening of 11/10/23. Continue on keppra as per neuro    Acute GI bleeding: secondary to bleeding duodenal ulcer. S/p EGD on 10/28/2023 with clip placed.   S/p IR embolization on 10/30/2023. Continue on PPI   Right peroneal DVT: initially refused ultrasound of the left lower extremity because of severe pain.  Ultrasound of the right lower extremity showed right calf DVT. Due to high risk of recurrent GI bleeding given recent massive GI bleed requiring embolization by IR, unable to treat with anticoagulation. Vascular surgery placed IVC filter on  11/09/23   Hemorrhagic shock: Resolved  Hypotension: resolved    Acute blood loss anemia: H&H are labile. S/p 2 units of pRBCs transfused so far. S/p transfusion with 2 units of platelet transfusions.   Pneumonia: initially completed 5 days of IV ceftriaxone and azithromycin on 10/07/2023. Restarted on broad-spectrum antibiotics (vancomycin, ceftriaxone and Flagyl) on 10/28/2023 when she decompensated requiring transfer to the ICU. Completed abx course x 2.   Acute UTI: urine cx showed strep agalactiae.  Completed antibiotics.   Acute on chronic diastolic CHF: continue on lasix. Monitor I/Os    COPD exacerbation: completed steroid course x2. Bronchodilators prn. Pulmon recs apprec and signed off 11/25/23    Acute on chronic hypoxemic respiratory failure & chronic hypercapnic respiratory failure: was intubated on 10/28/2023 and extubated on 11/01/2023. BiPAP qhs   DM2: likely poorly controlled. Continue on glargine, SSI w/ accuchecks   Anxiety/panic attacks: severity unknown. Continue on home dose of sertraline   Non-small cell carcinoma -- pt found to have 1.3 cm right upper lung spiculated nodule: S/p bronchoscopy on 10/20/2023.  Pathology showed rare atypical cells, but unclear if malignancy and findings more consistent with reactive bronchial cells with acute inflammation. Lung biopsy repeated 1/10, results today per Dr. Alena Bills note. F/u outpatient w/ onco    Hypothyroidism: continue on home dose of levothyroxine.  Daughter requested 1/11 to have thyroid labs repeated, citing concern for possible Hashimoto's Encephalopathy causing patient's persistent mental status changes.  Advised acute setting and recent illnesses often make TSH and T4 abnormal and not very useful clinically. Negative anti-TPO Ab (obtained at daughter's request as she has hashimoto's)   Physical debility :  PT/OT recs SNF. Pt's insurance denied SNF.  Pt's daughter appealed the denial       DVT prophylaxis:  SCDs Code Status: DNR Family Communication:  Disposition Plan: pt's insurance denied SNF placement. Pt's daughter appealed the denial and now waiting hear back from pt's insurance   Level of care: Telemetry Medical  Status is: Inpatient Remains inpatient appropriate because: medically stable. Needs SNF placement but pt's insurance denied SNF and pt's daughter appealed the denial. Still waiting to hear back from pt's insurance     Consultants:  GI Pulmon   Procedures:   Antimicrobials:    Subjective: Pt c/o room being arranged differently today   Objective: Vitals:   11/26/23 2020 11/27/23 0341 11/27/23 0420 11/27/23 0727  BP:   130/63   Pulse:   86   Resp:   18   Temp:   98.4 F (36.9 C)   TempSrc:      SpO2: 93%  95% 91%  Weight:  67.9 kg    Height:        Intake/Output Summary (Last 24 hours) at 11/27/2023 0804 Last data filed at 11/26/2023 2058 Gross per 24 hour  Intake 480 ml  Output 600 ml  Net -120 ml   Filed Weights   11/25/23 0304 11/26/23 0307 11/27/23 0341  Weight: 67.7 kg 66.7 kg 67.9 kg    Examination:  General exam: appears comfortable  Respiratory system: diminished breath sounds b/l  Cardiovascular system: S1/S2+. No rubs or gallops  Gastrointestinal system: abd is soft, NT, ND & normal bowel sounds  Central nervous system: alert & awake. Moves all extremities  Psychiatry: judgement and insight appears poor. Flat mood and affect     Data Reviewed: I have personally reviewed following labs and imaging studies  CBC: Recent Labs  Lab 11/20/23 1304 11/26/23 0538  WBC 4.9 4.6  HGB 10.4* 9.9*  HCT 32.0* 30.8*  MCV 91.2 89.3  PLT 248 254   Basic Metabolic Panel: Recent Labs  Lab 11/20/23 1304 11/26/23 0538  NA 140 141  K 3.5 3.5  CL 99 102  CO2 29 29  GLUCOSE 181* 143*  BUN 17 21  CREATININE 0.86 0.65  CALCIUM 9.1 9.1  MG 2.1  --   PHOS 3.5  --    GFR: Estimated Creatinine Clearance: 57.1 mL/min (by C-G formula based on  SCr of 0.65 mg/dL). Liver Function Tests: No results for input(s): "AST", "ALT", "ALKPHOS", "BILITOT", "PROT", "ALBUMIN" in the last 168 hours. No results for input(s): "LIPASE", "AMYLASE" in the last 168 hours. No results for input(s): "AMMONIA" in the last 168 hours. Coagulation Profile: No results for input(s): "INR", "PROTIME" in the last 168 hours. Cardiac Enzymes: No results for input(s): "CKTOTAL", "CKMB", "CKMBINDEX", "TROPONINI" in the last 168 hours. BNP (last 3 results) No results for input(s): "PROBNP" in the last 8760 hours. HbA1C: No results for input(s): "HGBA1C" in the last 72 hours. CBG: Recent Labs  Lab 11/26/23 1536 11/26/23 1840 11/26/23 1947 11/26/23 2029 11/27/23 0438  GLUCAP 223* 133* 139* 90 176*   Lipid Profile: No results for input(s): "CHOL", "HDL", "LDLCALC", "TRIG", "CHOLHDL", "LDLDIRECT" in the last 72 hours. Thyroid Function Tests: No results for input(s): "TSH", "T4TOTAL", "FREET4", "T3FREE", "THYROIDAB" in the last 72 hours. Anemia Panel: No results for input(s): "VITAMINB12", "FOLATE", "FERRITIN", "TIBC", "IRON", "RETICCTPCT" in the last 72 hours. Sepsis Labs: No results for input(s): "PROCALCITON", "LATICACIDVEN" in the last 168 hours.  No results found for this or any previous visit (from  the past 240 hours).       Radiology Studies: No results found.      Scheduled Meds:  arformoterol  15 mcg Nebulization BID   docusate sodium  100 mg Oral BID   furosemide  20 mg Oral Daily   insulin aspart  0-5 Units Subcutaneous QHS   insulin aspart  0-9 Units Subcutaneous TID WC   insulin aspart  4 Units Subcutaneous TID WC   insulin glargine-yfgn  18 Units Subcutaneous Daily   ipratropium-albuterol  3 mL Nebulization BID   levETIRAcetam  500 mg Oral BID   levothyroxine  150 mcg Oral Q0600   multivitamin with minerals  1 tablet Oral Daily   mouth rinse  15 mL Mouth Rinse BID   pantoprazole  40 mg Oral BID AC   polyethylene glycol  17 g  Oral Daily   rOPINIRole  4 mg Oral QHS   senna-docusate  1 tablet Oral Daily   sertraline  50 mg Oral Daily   Continuous Infusions:   LOS: 56 days      Charise Killian, MD [facility] Pager [phone]  If 7PM-7AM, please contact night-coverage [email or web address] 11/27/2023, 8:04 AM

## 2023-11-28 DIAGNOSIS — G9341 Metabolic encephalopathy: Secondary | ICD-10-CM | POA: Diagnosis not present

## 2023-11-28 DIAGNOSIS — Z515 Encounter for palliative care: Secondary | ICD-10-CM | POA: Diagnosis not present

## 2023-11-28 DIAGNOSIS — J441 Chronic obstructive pulmonary disease with (acute) exacerbation: Secondary | ICD-10-CM | POA: Diagnosis not present

## 2023-11-28 DIAGNOSIS — M79604 Pain in right leg: Secondary | ICD-10-CM | POA: Diagnosis not present

## 2023-11-28 LAB — GLUCOSE, CAPILLARY
Glucose-Capillary: 200 mg/dL — ABNORMAL HIGH (ref 70–99)
Glucose-Capillary: 213 mg/dL — ABNORMAL HIGH (ref 70–99)
Glucose-Capillary: 183 mg/dL — ABNORMAL HIGH (ref 70–99)
Glucose-Capillary: 163 mg/dL — ABNORMAL HIGH (ref 70–99)

## 2023-11-28 MED ORDER — ALUM & MAG HYDROXIDE-SIMETH 200-200-20 MG/5ML PO SUSP
30.0000 mL | Freq: Four times a day (QID) | ORAL | Status: DC | PRN
  Administered 2023-11-28 – 2023-12-01 (×6): 30 mL via ORAL
  Filled 2023-11-28 (×6): qty 30

## 2023-11-28 NOTE — TOC Progression Note (Signed)
Transition of Care [facility]) - Progression Note    Patient Details  Name: Michele House MRN: [id number] Date of Birth: 26-Apr-1956  [facility] of [facility] CM/SW Contact  Bing Quarry, RN Phone Number: 11/28/2023, 12:53 PM  Clinical Narrative: 1/26: Contacted daughter, Gunnar Fusi, to check on appeal and more information was requested and will be faxed today by her brother.    Gabriel Cirri MSN RN CM  RN Case Manager [place]  Transitions of Care Direct Dial: [phone] (Weekends Only) [facility] Main Office Phone: [phone] [facility] Fax: [phone] [email or web address]       Expected Discharge Plan: Skilled Nursing Facility Barriers to Discharge: Continued Medical Work up  Expected Discharge Plan and Services         Expected Discharge Date: 11/09/23                                     Social Determinants of Health (SDOH) Interventions SDOH Screenings   Food Insecurity: Patient Unable To Answer (10/03/2023)  Recent Concern: Food Insecurity - Food Insecurity Present (09/24/2023)  Housing: High Risk (10/03/2023)  Transportation Needs: Patient Unable To Answer (10/03/2023)  Recent Concern: Transportation Needs - Unmet Transportation Needs (09/24/2023)  Utilities: Patient Unable To Answer (10/03/2023)  Social Connections: Patient Declined (11/02/2023)  Tobacco Use: Medium Risk (11/12/2023)    Readmission Risk Interventions     House data to display

## 2023-11-28 NOTE — Progress Notes (Signed)
PROGRESS NOTE    Michele House  [id number] DOB: December 18, 1955 DOA: 10/02/2023 PCP: [facility]    Assessment & Plan:   Principal Problem:   Acute metabolic encephalopathy Active Problems:   CVA (cerebral vascular accident) (HCC)   Uncontrolled type 2 diabetes mellitus with hypoglycemia, without long-term current use of insulin (HCC)   Hypotension   Acute on chronic respiratory failure with hypoxia (HCC)   Hypernatremia   COPD exacerbation (HCC)   Lung nodule   Pneumonia   UTI (urinary tract infection)   (HFpEF) heart failure with preserved ejection fraction (HCC)   Essential hypertension   Type 2 diabetes mellitus with obesity (HCC)   Melena   Gastrointestinal hemorrhage with melena   Duodenal ulcer   Acute deep vein thrombosis (DVT) of right lower extremity (HCC)   Bilateral leg pain  Assessment and Plan: Acute metabolic encephalopathy: much improved. Completed steroid course. Ddx included autoimmune encephalitis. Delirium precautions. Avoid benzos & sedating meds   Acute CVA: with right hemiplegia. Off of Plavix because of recent acute GI bleeding. 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   Possible seizure: on evening of 11/10/23. Continue on keppra as per neuro   Acute GI bleeding: secondary to bleeding duodenal ulcer. S/p EGD on 10/28/2023 with clip placed.   S/p IR embolization on 10/30/2023. Continue on PPI   Right peroneal DVT: initially refused ultrasound of the left lower extremity because of severe pain.  Ultrasound of the right lower extremity showed right calf DVT. Due to high risk of recurrent GI bleeding given recent massive GI bleed requiring embolization by IR, unable to treat with anticoagulation. Vascular surgery placed IVC filter on  11/09/23   Hemorrhagic shock: Resolved  Hypotension: resolved    Acute blood loss anemia: H&H are labile. S/p 2 units of pRBCs transfused so far. S/p transfusion with 2 units of platelet transfusions.   Pneumonia: initially completed 5 days of IV ceftriaxone and azithromycin on 10/07/2023. Restarted on broad-spectrum antibiotics (vancomycin, ceftriaxone and Flagyl) on 10/28/2023 when she decompensated requiring transfer to the ICU. Completed abx course x 2.   Acute UTI: urine cx showed strep agalactiae.  Completed antibiotics.   Acute on chronic diastolic CHF: continue on lasix. Monitor I/Os   COPD exacerbation: completed steroid course x2. Bronchodilators prn. Pulmon recs apprec and signed off 11/25/23    Acute on chronic hypoxemic respiratory failure & chronic hypercapnic respiratory failure: was intubated on 10/28/2023 and extubated on 11/01/2023. BiPAP qhs   DM2: likely poorly controlled. Continue on glargine, SSI w/ accuchecks   Anxiety/panic attacks: severity unknown. Continue on home dose of sertraline   Non-small cell carcinoma -- pt found to have 1.3 cm right upper lung spiculated nodule: S/p bronchoscopy on 10/20/2023.  Pathology showed rare atypical cells, but unclear if malignancy and findings more consistent with reactive bronchial cells with acute inflammation. Lung biopsy repeated 1/10, results today per Dr. Alena Bills note. F/u outpatient w/ onco    Hypothyroidism: continue on home dose of levothyroxine.  Daughter requested 1/11 to have thyroid labs repeated, citing concern for possible Hashimoto's Encephalopathy causing patient's persistent mental status changes.  Advised acute setting and recent illnesses often make TSH and T4 abnormal and not very useful clinically. Negative anti-TPO Ab (obtained at daughter's request as she has hashimoto's)   Physical debility : PT/OT recs  SNF. Pt's insurance denied SNF.  Pt's daughter appealed the denial       DVT prophylaxis: SCDs Code  Status: DNR Family Communication:  Disposition Plan: pt's insurance denied SNF placement. Pt's daughter appealed the denial and still waiting to hear back from pt's insurance   Level of care: Telemetry Medical  Status is: Inpatient Remains inpatient appropriate because: medically stable. Needs SNF placement but pt's insurance denied SNF and pt's daughter appealed the denial. Still waiting to hear back from pt's insurance     Consultants:  GI Pulmon   Procedures:   Antimicrobials:    Subjective: Pt c/o indigestion   Objective: Vitals:   11/27/23 2031 11/28/23 0500 11/28/23 0738 11/28/23 0811  BP: 126/61   124/67  Pulse: 91   93  Resp: 16   18  Temp: 98 F (36.7 C)     TempSrc: Oral     SpO2: 92%  91% (!) 89%  Weight:  67.2 kg    Height:        Intake/Output Summary (Last 24 hours) at 11/28/2023 0817 Last data filed at 11/27/2023 1912 Gross per 24 hour  Intake 0 ml  Output --  Net 0 ml   Filed Weights   11/26/23 0307 11/27/23 0341 11/28/23 0500  Weight: 66.7 kg 67.9 kg 67.2 kg    Examination:  General exam: appears calm & comfortable Respiratory system: decreased breath sounds b/l Cardiovascular system: S1 & S2+. No rubs or clicks  Gastrointestinal system: abd is soft, NT, ND & normal bowel sounds  Central nervous system: alert & awake. Moves all extremities Psychiatry: judgement and insight appears poor. Flat mood and affect    Data Reviewed: I have personally reviewed following labs and imaging studies  CBC: Recent Labs  Lab 11/26/23 0538  WBC 4.6  HGB 9.9*  HCT 30.8*  MCV 89.3  PLT 254   Basic Metabolic Panel: Recent Labs  Lab 11/26/23 0538  NA 141  K 3.5  CL 102  CO2 29  GLUCOSE 143*  BUN 21  CREATININE 0.65  CALCIUM 9.1   GFR: Estimated Creatinine Clearance: 56.8 mL/min (by C-G formula based on SCr of 0.65 mg/dL). Liver Function Tests: No results for input(s): "AST", "ALT", "ALKPHOS", "BILITOT", "PROT", "ALBUMIN" in the last 168  hours. No results for input(s): "LIPASE", "AMYLASE" in the last 168 hours. No results for input(s): "AMMONIA" in the last 168 hours. Coagulation Profile: No results for input(s): "INR", "PROTIME" in the last 168 hours. Cardiac Enzymes: No results for input(s): "CKTOTAL", "CKMB", "CKMBINDEX", "TROPONINI" in the last 168 hours. BNP (last 3 results) No results for input(s): "PROBNP" in the last 8760 hours. HbA1C: No results for input(s): "HGBA1C" in the last 72 hours. CBG: Recent Labs  Lab 11/27/23 1124 11/27/23 1345 11/27/23 1633 11/27/23 2030 11/27/23 2209  GLUCAP 176* 131* 170* 47* 133*   Lipid Profile: No results for input(s): "CHOL", "HDL", "LDLCALC", "TRIG", "CHOLHDL", "LDLDIRECT" in the last 72 hours. Thyroid Function Tests: No results for input(s): "TSH", "T4TOTAL", "FREET4", "T3FREE", "THYROIDAB" in the last 72 hours. Anemia Panel: No results for input(s): "VITAMINB12", "FOLATE", "FERRITIN", "TIBC", "IRON", "RETICCTPCT" in the last 72 hours. Sepsis Labs: No results for input(s): "PROCALCITON", "LATICACIDVEN" in the last 168 hours.  No results found for this or any previous visit (from the past 240 hours).       Radiology Studies: No results found.      Scheduled Meds:  arformoterol  15 mcg Nebulization BID   docusate sodium  100 mg Oral BID   furosemide  20 mg Oral Daily   insulin aspart  0-5 Units Subcutaneous QHS   insulin aspart  0-9 Units Subcutaneous TID WC   insulin aspart  4 Units Subcutaneous TID WC   insulin glargine-yfgn  18 Units Subcutaneous Daily   ipratropium-albuterol  3 mL Nebulization BID   levETIRAcetam  500 mg Oral BID   levothyroxine  150 mcg Oral Q0600   multivitamin with minerals  1 tablet Oral Daily   mouth rinse  15 mL Mouth Rinse BID   pantoprazole  40 mg Oral BID AC   polyethylene glycol  17 g Oral Daily   rOPINIRole  4 mg Oral QHS   senna-docusate  1 tablet Oral Daily   sertraline  50 mg Oral Daily   Continuous  Infusions:   LOS: 57 days      Charise Killian, MD [facility] Pager [phone]  If 7PM-7AM, please contact night-coverage [email or web address] 11/28/2023, 8:17 AM

## 2023-11-28 NOTE — Progress Notes (Signed)
Daily Progress Note   Patient Name: Michele House       Date: 11/28/2023 DOB: 30-Jun-1956  Age: 68 y.o. MRN#: [id number] Attending Physician: Charise Killian, MD Primary Care Physician: [facility] Admit Date: 10/02/2023  Reason for Consultation/Follow-up: Establishing goals of care  HPI/Brief Hospital Review: 68 y.o. female  with past medical history of COPD with chronic respiratory failure on 2L [place] at baseline, T2DM, HFpEF and hypertension admitted on 10/02/2023 with AMS.   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   Imaging also revealed right upper lode lung nodule   Developed respiratory distress on 12/23--found to have metapneumovirus, stabilized but unfortunately developed worsening respiratory distress and required mechanical ventilation S/p bronchoscopy 12/27 biopsy of lung nodule performed-very rare atypical cells and mostly reactive alveolar cells, concern for likely primary lung cancer   Course further complicated by acute anemia and development of shock S/p EGD oozing duodenal ulcer-clipped 12/26 S/p IR embolization to duodenal artery 12/28    Self extubated 12/27 soon after required reintubation   Successfully extubated 12/30, transferred out of ICU 1/2   Unfortunately has since been complicated by finding of acute DVT requiring IVC filter placement, potential seizure activity (history of seizure disorder but last seizure about 30 years ago) and underwent bronchoscopy with biopsy on 1/10   1/14 biopsy resulted for non-small cell lung cancer, oncology to follow as outpatient   1/26 SNF  denied by insurance, family has initiated appeal process  Palliative medicine was consulted for assisting with goals of care conversations.   Subjective: Extensive chart review has been completed prior to meeting patient including labs, vital signs, imaging, progress notes, orders, and available advanced directive documents from current and previous encounters.    Received a call from daughter-Michele House requesting call back.  Visited with Michele House at her bedside. Michele House tearful during our visit. She shares being frustrated by being in the hospital as long as she has. She is also complaining of right shoulder and elbow pain, she feels it is from lying on her side too long. Assessed shoulder and elbow, no skin breakdown, limited ROM due to CVA residuals. Spoke with nursing staff, assisted with changing  bed linens, recommended pain medication be administered due to complaints of pain. Nursing unaware of any other acute complaints or concerns.  Son-Michele House visited at bedside during visit. Inquired about forms that have been completed for appeal. Called and spoke with daughter-Michele House, also inquiring about forms for appeal that have been completed. Notified TOC of inquiries and requested they be contacted.  Answered and addressed all questions and concerns. PMT remains available for needs and support as appropriate. PMT will continue to follow along peripherally.  Care plan was discussed with nursing staff and [facility]  Thank you for allowing the Palliative Medicine Team to assist in the care of this patient.  Total time:  35 minutes  Time spent includes: Detailed review of medical records (labs, imaging, vital signs), medically appropriate exam (mental status, respiratory, cardiac, skin), discussed with treatment team, counseling and educating patient, family and staff, documenting clinical information, medication management and coordination of care.  Leeanne Deed, DNP, AGNP-C Palliative  Medicine   Please contact Palliative Medicine Team phone at [phone] for questions and concerns.

## 2023-11-28 NOTE — Plan of Care (Signed)
Pt is alert and oriented with right side weakness due to history of CVA. Pt waiting on placement. Bed alarm on for safety. Pt had bowel movement today.  Problem: Education: Goal: Ability to describe self-care measures that may prevent or decrease complications (Diabetes Survival Skills Education) will improve Outcome: Progressing   Problem: Coping: Goal: Ability to adjust to condition or change in health will improve Outcome: Progressing   Problem: Fluid Volume: Goal: Ability to maintain a balanced intake and output will improve Outcome: Progressing   Problem: Health Behavior/Discharge Planning: Goal: Ability to identify and utilize available resources and services will improve Outcome: Progressing Goal: Ability to manage health-related needs will improve Outcome: Progressing   Problem: Metabolic: Goal: Ability to maintain appropriate glucose levels will improve Outcome: Progressing   Problem: Nutritional: Goal: Maintenance of adequate nutrition will improve Outcome: Progressing Goal: Progress toward achieving an optimal weight will improve Outcome: Progressing   Problem: Skin Integrity: Goal: Risk for impaired skin integrity will decrease Outcome: Progressing   Problem: Tissue Perfusion: Goal: Adequacy of tissue perfusion will improve Outcome: Progressing   Problem: Education: Goal: Knowledge of General Education information will improve Description: Including pain rating scale, medication(s)/side effects and non-pharmacologic comfort measures Outcome: Progressing   Problem: Health Behavior/Discharge Planning: Goal: Ability to manage health-related needs will improve Outcome: Progressing   Problem: Clinical Measurements: Goal: Ability to maintain clinical measurements within normal limits will improve Outcome: Progressing Goal: Will remain free from infection Outcome: Progressing Goal: Diagnostic test results will improve Outcome: Progressing Goal: Respiratory  complications will improve Outcome: Progressing Goal: Cardiovascular complication will be avoided Outcome: Progressing   Problem: Activity: Goal: Risk for activity intolerance will decrease Outcome: Progressing   Problem: Nutrition: Goal: Adequate nutrition will be maintained Outcome: Progressing   Problem: Coping: Goal: Level of anxiety will decrease Outcome: Progressing   Problem: Elimination: Goal: Will not experience complications related to bowel motility Outcome: Progressing Goal: Will not experience complications related to urinary retention Outcome: Progressing   Problem: Pain Management: Goal: General experience of comfort will improve Outcome: Progressing   Problem: Safety: Goal: Ability to remain free from injury will improve Outcome: Progressing   Problem: Skin Integrity: Goal: Risk for impaired skin integrity will decrease Outcome: Progressing   Problem: Education: Goal: Knowledge of disease or condition will improve Outcome: Progressing Goal: Knowledge of the prescribed therapeutic regimen will improve Outcome: Progressing Goal: Individualized Educational Video(s) Outcome: Progressing   Problem: Activity: Goal: Ability to tolerate increased activity will improve Outcome: Progressing Goal: Will verbalize the importance of balancing activity with adequate rest periods Outcome: Progressing   Problem: Respiratory: Goal: Ability to maintain a clear airway will improve Outcome: Progressing Goal: Levels of oxygenation will improve Outcome: Progressing Goal: Ability to maintain adequate ventilation will improve Outcome: Progressing   Problem: Activity: Goal: Ability to tolerate increased activity will improve Outcome: Progressing   Problem: Clinical Measurements: Goal: Ability to maintain a body temperature in the normal range will improve Outcome: Progressing   Problem: Respiratory: Goal: Ability to maintain adequate ventilation will  improve Outcome: Progressing Goal: Ability to maintain a clear airway will improve Outcome: Progressing   Problem: Education: Goal: Knowledge of disease or condition will improve Outcome: Progressing Goal: Knowledge of secondary prevention will improve (MUST DOCUMENT ALL) Outcome: Progressing Goal: Knowledge of patient specific risk factors will improve Loraine Leriche N/A or DELETE if not current risk factor) Outcome: Progressing   Problem: Ischemic Stroke/TIA Tissue Perfusion: Goal: Complications of ischemic stroke/TIA will be minimized Outcome: Progressing

## 2023-11-29 ENCOUNTER — Inpatient Hospital Stay: Payer: 59

## 2023-11-29 DIAGNOSIS — G9341 Metabolic encephalopathy: Secondary | ICD-10-CM | POA: Diagnosis not present

## 2023-11-29 LAB — GLUCOSE, CAPILLARY
Glucose-Capillary: 212 mg/dL — ABNORMAL HIGH (ref 70–99)
Glucose-Capillary: 127 mg/dL — ABNORMAL HIGH (ref 70–99)
Glucose-Capillary: 132 mg/dL — ABNORMAL HIGH (ref 70–99)
Glucose-Capillary: 184 mg/dL — ABNORMAL HIGH (ref 70–99)

## 2023-11-29 MED ORDER — MORPHINE SULFATE (PF) 2 MG/ML IV SOLN: 1.0000 mg | INTRAVENOUS | Status: DC | PRN

## 2023-11-29 MED ORDER — LIDOCAINE 5 % EX PTCH
1.0000 | MEDICATED_PATCH | CUTANEOUS | Status: DC
  Administered 2023-11-29 – 2023-12-02 (×4): 1 via TRANSDERMAL
  Filled 2023-11-29 (×4): qty 1

## 2023-11-29 MED ORDER — ENOXAPARIN SODIUM 40 MG/0.4ML IJ SOSY
40.0000 mg | PREFILLED_SYRINGE | Freq: Every day | INTRAMUSCULAR | Status: DC
  Administered 2023-11-29 – 2023-12-01 (×3): 40 mg via SUBCUTANEOUS
  Filled 2023-11-29 (×3): qty 0.4

## 2023-11-29 NOTE — Plan of Care (Signed)
Problem: Education: Goal: Ability to describe self-care measures that may prevent or decrease complications (Diabetes Survival Skills Education) will improve Outcome: Progressing   Problem: Coping: Goal: Ability to adjust to condition or change in health will improve Outcome: Progressing   Problem: Fluid Volume: Goal: Ability to maintain a balanced intake and output will improve Outcome: Progressing   Problem: Health Behavior/Discharge Planning: Goal: Ability to identify and utilize available resources and services will improve Outcome: Progressing Goal: Ability to manage health-related needs will improve Outcome: Progressing   Problem: Metabolic: Goal: Ability to maintain appropriate glucose levels will improve Outcome: Progressing   Problem: Nutritional: Goal: Maintenance of adequate nutrition will improve Outcome: Progressing Goal: Progress toward achieving an optimal weight will improve Outcome: Progressing   Problem: Skin Integrity: Goal: Risk for impaired skin integrity will decrease Outcome: Progressing   Problem: Tissue Perfusion: Goal: Adequacy of tissue perfusion will improve Outcome: Progressing   Problem: Education: Goal: Knowledge of General Education information will improve Description: Including pain rating scale, medication(s)/side effects and non-pharmacologic comfort measures Outcome: Progressing   Problem: Health Behavior/Discharge Planning: Goal: Ability to manage health-related needs will improve Outcome: Progressing   Problem: Clinical Measurements: Goal: Ability to maintain clinical measurements within normal limits will improve Outcome: Progressing Goal: Will remain free from infection Outcome: Progressing Goal: Diagnostic test results will improve Outcome: Progressing Goal: Respiratory complications will improve Outcome: Progressing Goal: Cardiovascular complication will be avoided Outcome: Progressing   Problem: Activity: Goal:  Risk for activity intolerance will decrease Outcome: Progressing   Problem: Nutrition: Goal: Adequate nutrition will be maintained Outcome: Progressing   Problem: Coping: Goal: Level of anxiety will decrease Outcome: Progressing   Problem: Elimination: Goal: Will not experience complications related to bowel motility Outcome: Progressing Goal: Will not experience complications related to urinary retention Outcome: Progressing   Problem: Pain Management: Goal: General experience of comfort will improve Outcome: Progressing   Problem: Safety: Goal: Ability to remain free from injury will improve Outcome: Progressing   Problem: Skin Integrity: Goal: Risk for impaired skin integrity will decrease Outcome: Progressing   Problem: Education: Goal: Knowledge of disease or condition will improve Outcome: Progressing Goal: Knowledge of the prescribed therapeutic regimen will improve Outcome: Progressing Goal: Individualized Educational Video(s) Outcome: Progressing   Problem: Activity: Goal: Ability to tolerate increased activity will improve Outcome: Progressing Goal: Will verbalize the importance of balancing activity with adequate rest periods Outcome: Progressing   Problem: Respiratory: Goal: Ability to maintain a clear airway will improve Outcome: Progressing Goal: Levels of oxygenation will improve Outcome: Progressing Goal: Ability to maintain adequate ventilation will improve Outcome: Progressing   Problem: Activity: Goal: Ability to tolerate increased activity will improve Outcome: Progressing   Problem: Clinical Measurements: Goal: Ability to maintain a body temperature in the normal range will improve Outcome: Progressing   Problem: Respiratory: Goal: Ability to maintain adequate ventilation will improve Outcome: Progressing Goal: Ability to maintain a clear airway will improve Outcome: Progressing   Problem: Education: Goal: Knowledge of disease or  condition will improve Outcome: Progressing Goal: Knowledge of secondary prevention will improve (MUST DOCUMENT ALL) Outcome: Progressing Goal: Knowledge of patient specific risk factors will improve Loraine Leriche N/A or DELETE if not current risk factor) Outcome: Progressing   Problem: Ischemic Stroke/TIA Tissue Perfusion: Goal: Complications of ischemic stroke/TIA will be minimized Outcome: Progressing   Problem: Coping: Goal: Will verbalize positive feelings about self Outcome: Progressing Goal: Will identify appropriate support needs Outcome: Progressing   Problem: Health Behavior/Discharge Planning: Goal: Ability to manage  health-related needs will improve Outcome: Progressing Goal: Goals will be collaboratively established with patient/family Outcome: Progressing   Problem: Self-Care: Goal: Ability to participate in self-care as condition permits will improve Outcome: Progressing Goal: Verbalization of feelings and concerns over difficulty with self-care will improve Outcome: Progressing Goal: Ability to communicate needs accurately will improve Outcome: Progressing

## 2023-11-29 NOTE — TOC Progression Note (Signed)
Transition of Care [facility]) - Progression Note    Patient Details  Name: Michele House MRN: [id number] Date of Birth: 1956-05-17  Transition of [facility] CM/SW Contact  Garret Reddish, RN Phone Number: 11/29/2023, 11:49 AM  Clinical Narrative:    Chart reviewed.  Spoke with [facility] Plan and SNF appeal is still pending at this time.    TOC will continue to follow progress of patient.     Expected Discharge Plan: Skilled Nursing Facility Barriers to Discharge: Continued Medical Work up  Expected Discharge Plan and Services         Expected Discharge Date: 11/09/23                                     Social Determinants of Health (SDOH) Interventions SDOH Screenings   Food Insecurity: Patient Unable To Answer (10/03/2023)  Recent Concern: Food Insecurity - Food Insecurity Present (09/24/2023)  Housing: High Risk (10/03/2023)  Transportation Needs: Patient Unable To Answer (10/03/2023)  Recent Concern: Transportation Needs - Unmet Transportation Needs (09/24/2023)  Utilities: Patient Unable To Answer (10/03/2023)  Social Connections: Patient Declined (11/02/2023)  Tobacco Use: Medium Risk (11/12/2023)    Readmission Risk Interventions     No data to display

## 2023-11-29 NOTE — Progress Notes (Signed)
PROGRESS NOTE    Michele House  ZOX:[id number] DOB: 1956/05/06 DOA: 10/02/2023 PCP: [facility]    Assessment & Plan:   Principal Problem:   Acute metabolic encephalopathy Active Problems:   CVA (cerebral vascular accident) (HCC)   Uncontrolled type 2 diabetes mellitus with hypoglycemia, without long-term current use of insulin (HCC)   Hypotension   Acute on chronic respiratory failure with hypoxia (HCC)   Hypernatremia   COPD exacerbation (HCC)   Lung nodule   Pneumonia   UTI (urinary tract infection)   (HFpEF) heart failure with preserved ejection fraction (HCC)   Essential hypertension   Type 2 diabetes mellitus with obesity (HCC)   Melena   Gastrointestinal hemorrhage with melena   Duodenal ulcer   Acute deep vein thrombosis (DVT) of right lower extremity (HCC)   Bilateral leg pain  Assessment and Plan: Acute metabolic encephalopathy: much improved. Completed steroid course. Ddx included autoimmune encephalitis. Delirium precautions. Avoid benzos & sedating meds   Acute CVA: with right hemiplegia. Off of Plavix because of recent acute GI bleeding. 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   Possible seizure: on evening of 11/10/23. Continue on keppra as per neuro    Acute GI bleeding: secondary to bleeding duodenal ulcer. S/p EGD on 10/28/2023 with clip placed.   S/p IR embolization on 10/30/2023. Continue on PPI   Right peroneal DVT: initially refused ultrasound of the left lower extremity because of severe pain.  Ultrasound of the right lower extremity showed right calf DVT. Due to high risk of recurrent GI bleeding given recent massive GI bleed requiring embolization by IR, unable to treat with anticoagulation. Vascular surgery placed IVC filter on  11/09/23   Hemorrhagic shock: Resolved  Hypotension: resolved    Acute blood loss anemia: H&H are labile. S/p 2 units of pRBCs transfused so far. S/p transfusion with 2 units of platelet transfusions.   Pneumonia: initially completed 5 days of IV ceftriaxone and azithromycin on 10/07/2023. Restarted on broad-spectrum antibiotics (vancomycin, ceftriaxone and Flagyl) on 10/28/2023 when she decompensated requiring transfer to the ICU. Completed abx course x 2.   Acute UTI: urine cx showed strep agalactiae.  Completed antibiotics.   Acute on chronic diastolic CHF: continue on lasix. Monitor I/Os   COPD exacerbation: completed steroid course x2. Bronchodilators prn. Pulmon recs apprec and signed off 11/25/23    Acute on chronic hypoxemic respiratory failure & chronic hypercapnic respiratory failure: was intubated on 10/28/2023 and extubated on 11/01/2023. BiPAP qhs   DM2: likely poorly controlled. Continue on glargine, SSI w/ accuchecks   Anxiety/panic attacks: severity unknown. Continue on home dose of sertraline   Non-small cell carcinoma -- pt found to have 1.3 cm right upper lung spiculated nodule: S/p bronchoscopy on 10/20/2023.  Pathology showed rare atypical cells, but unclear if malignancy and findings more consistent with reactive bronchial cells with acute inflammation. Lung biopsy repeated 1/10, results today per Dr. Alena Bills note. F/u outpatient w/ onco    Hypothyroidism: continue on levothryoxine.  Daughter requested 1/11 to have thyroid labs repeated, citing concern for possible Hashimoto's Encephalopathy causing patient's persistent mental status changes.  Advised acute setting and recent illnesses often make TSH and T4 abnormal and not very useful clinically. Negative anti-TPO Ab (obtained at daughter's request as she has hashimoto's)   Physical debility : PT/OT recs SNF. Pt's  insurance denied SNF.  Appeal decision has not come back yet as per CM        DVT prophylaxis:  SCDs Code Status: DNR Family Communication:  Disposition Plan: pt's insurance denied SNF placement. Pt's daughter appealed the denial and still waiting to hear back from pt's insurance. Appeal decision is still pending   Level of care: Telemetry Medical  Status is: Inpatient Remains inpatient appropriate because: medically stable. Needs SNF placement but pt's insurance denied SNF and pt's daughter appealed the denial. Appeal decision is still pending     Consultants:  GI Pulmon   Procedures:   Antimicrobials:    Subjective: Pt c/o right arm pain. Pt denies any falls and/or trauma   Objective: Vitals:   11/28/23 2019 11/29/23 0219 11/29/23 0637 11/29/23 0731  BP: 113/69  (!) 142/68   Pulse: (!) 101  95   Resp:      Temp: 98 F (36.7 C)  97.9 F (36.6 C)   TempSrc: Oral  Oral   SpO2: 92%  94% 94%  Weight:  67.9 kg    Height:        Intake/Output Summary (Last 24 hours) at 11/29/2023 0821 Last data filed at 11/28/2023 1918 Gross per 24 hour  Intake 240 ml  Output --  Net 240 ml   Filed Weights   11/27/23 0341 11/28/23 0500 11/29/23 0219  Weight: 67.9 kg 67.2 kg 67.9 kg    Examination:  General exam: appears uncomfortable  Respiratory system: diminished breath sounds b/l  Cardiovascular system: S1/S2+. No rubs or clicks  Gastrointestinal system: abd is soft, NT, obese & hypoactive bowel sounds Central nervous system: alert & awake. Moves all extremities  Psychiatry: judgement and insight appears at baseline.     Data Reviewed: I have personally reviewed following labs and imaging studies  CBC: Recent Labs  Lab 11/26/23 0538  WBC 4.6  HGB 9.9*  HCT 30.8*  MCV 89.3  PLT 254   Basic Metabolic Panel: Recent Labs  Lab 11/26/23 0538  NA 141  K 3.5  CL 102  CO2 29  GLUCOSE 143*  BUN 21  CREATININE 0.65  CALCIUM 9.1   GFR: Estimated Creatinine Clearance: 57.1 mL/min (by C-G formula based on SCr of 0.65 mg/dL). Liver Function Tests: No  results for input(s): "AST", "ALT", "ALKPHOS", "BILITOT", "PROT", "ALBUMIN" in the last 168 hours. No results for input(s): "LIPASE", "AMYLASE" in the last 168 hours. No results for input(s): "AMMONIA" in the last 168 hours. Coagulation Profile: No results for input(s): "INR", "PROTIME" in the last 168 hours. Cardiac Enzymes: No results for input(s): "CKTOTAL", "CKMB", "CKMBINDEX", "TROPONINI" in the last 168 hours. BNP (last 3 results) No results for input(s): "PROBNP" in the last 8760 hours. HbA1C: No results for input(s): "HGBA1C" in the last 72 hours. CBG: Recent Labs  Lab 11/27/23 2209 11/28/23 0826 11/28/23 1203 11/28/23 1644 11/28/23 2023  GLUCAP 133* 200* 213* 183* 163*   Lipid Profile: No results for input(s): "CHOL", "HDL", "LDLCALC", "TRIG", "CHOLHDL", "LDLDIRECT" in the last 72 hours. Thyroid Function Tests: No results for input(s): "TSH", "T4TOTAL", "FREET4", "T3FREE", "THYROIDAB" in the last 72 hours. Anemia Panel: No results for input(s): "VITAMINB12", "FOLATE", "FERRITIN", "TIBC", "IRON", "RETICCTPCT" in the last 72 hours. Sepsis Labs: No results for input(s): "PROCALCITON", "LATICACIDVEN" in the last 168 hours.  No results found for this or any previous visit (from the past 240 hours).       Radiology Studies: No results found.  Scheduled Meds:  arformoterol  15 mcg Nebulization BID   docusate sodium  100 mg Oral BID   furosemide  20 mg Oral Daily   insulin aspart  0-5 Units Subcutaneous QHS   insulin aspart  0-9 Units Subcutaneous TID WC   insulin aspart  4 Units Subcutaneous TID WC   insulin glargine-yfgn  18 Units Subcutaneous Daily   ipratropium-albuterol  3 mL Nebulization BID   levETIRAcetam  500 mg Oral BID   levothyroxine  150 mcg Oral Q0600   multivitamin with minerals  1 tablet Oral Daily   mouth rinse  15 mL Mouth Rinse BID   pantoprazole  40 mg Oral BID AC   polyethylene glycol  17 g Oral Daily   rOPINIRole  4 mg Oral QHS    senna-docusate  1 tablet Oral Daily   sertraline  50 mg Oral Daily   Continuous Infusions:   LOS: 58 days      Charise Killian, MD [facility] Pager [phone]  If 7PM-7AM, please contact night-coverage [email or web address] 11/29/2023, 8:21 AM

## 2023-11-30 DIAGNOSIS — G9341 Metabolic encephalopathy: Secondary | ICD-10-CM | POA: Diagnosis not present

## 2023-11-30 LAB — GLUCOSE, CAPILLARY
Glucose-Capillary: 165 mg/dL — ABNORMAL HIGH (ref 70–99)
Glucose-Capillary: 258 mg/dL — ABNORMAL HIGH (ref 70–99)
Glucose-Capillary: 204 mg/dL — ABNORMAL HIGH (ref 70–99)
Glucose-Capillary: 189 mg/dL — ABNORMAL HIGH (ref 70–99)

## 2023-11-30 NOTE — Progress Notes (Signed)
Nutrition Follow-up  DOCUMENTATION CODES:   Not applicable  INTERVENTION:   -Continue MVI with minerals daily -Continue Magic cup TID with meals, each supplement provides 290 kcal and 9 grams of protein   NUTRITION DIAGNOSIS:   Inadequate oral intake related to inability to eat (pt sedated and ventilated) as evidenced by NPO status.  Progressing; advanced to PO diet on 11/01/23   GOAL:   Patient will meet greater than or equal to 90% of their needs  Progressing   MONITOR:   PO intake, Supplement acceptance, Diet advancement  REASON FOR ASSESSMENT:   Ventilator    ASSESSMENT:   68 y/o female with h/o HTN, HLD, COPD, CHF, thyroid disease, lung nodule and GERD who is admitted with COPD/asthma exacerbation in the setting of metapneumovirus infection, acute anemia with hemorrhagic shock secondary to duodenal ulcer s/p EGD with clip and Injection 10/28/2023 and s/p IR Embolization on 10/30/2023, AKI and acute CVA with right sided hemiparesis.  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   Pt remains on a carb modified diet. Noted meal completions 100%.   Wt has been stable for the past month.  Per TOC notes, waiting on appeal for SNF placement.   Medications reviewed and include colace, lovenox, lasix, keppra, miralax, and senokot.   Labs reviewed: CBGS: 127-213 (inpatient orders for glycemic control are 0-5 units insulin aspart daily at bedtime, 0-9 units insulin aspart TID with meals, 4 units insulin glatigne-yfgn daily, and 18 units  insulin glatgine-ufgn daily).    Diet Order:   Diet Order             Diet Carb Modified Fluid consistency: Thin  Diet effective now                   EDUCATION NEEDS:   No education needs have been identified at this time  Skin:  Skin Assessment: Skin Integrity Issues: Skin Integrity Issues:: Incisions Incisions: closed rt groin  Last BM:  11/24/23 (type 6)  Height:   Ht Readings from Last 1 Encounters:  11/12/23 5' (1.524 m)    Weight:   Wt Readings from Last 1 Encounters:  11/30/23 70.2 kg    Ideal Body Weight:  45.45 kg  BMI:  Body mass index is 30.23 kg/m.  Estimated Nutritional Needs:   Kcal:  1600-1800  Protein:  85-100 grams  Fluid:  > 1.6 L    Levada Schilling, RD, LDN, CDCES Registered Dietitian III Certified Diabetes Care and Education Specialist If unable to reach this RD, please use "RD Inpatient" group chat on secure chat between hours of 8am-4 pm daily

## 2023-11-30 NOTE — Progress Notes (Signed)
Physical Therapy Treatment Patient Details Name: Michele House MRN: [id number] DOB: 06/26/1956 Today's Date: 11/30/2023   History of Present Illness Patient is a 68 year old with prolonged hospital stay, originally admitted with AMS. 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 She developed respiratory distress requiring mechanical ventilation. Self extubated 12/27 with re-intubation and extubation on 12/30. Pt also found with RLE DVT status post IVC filter placement given bleeding anticoagulation contraindicated. Seizure on 1/8 with transfer to 2nd floor.    PT Comments  Patient alert, agreeable and motivated to mobilize but did display lability and decreased situational awareness/awareness of deficits. maxA for bed mobility and extra time to maximize participation. Sit <> stand to sara steady, modA with pt pulling with LUE. Able to initiate sit <> stand from steady with minA-CGAx2 two more times prior to transitioning to recliner in room, and a few RLE exercises (able to LAQ in sitting in full ROM). The patient would benefit from further skilled PT intervention to continue to progress towards goals.     If plan is discharge home, recommend the following: Two people to help with walking and/or transfers;Two people to help with bathing/dressing/bathroom;Assistance with cooking/housework;Direct supervision/assist for medications management;Assistance with feeding;Direct supervision/assist for financial management;Assist for transportation;Help with stairs or ramp for entrance;Supervision due to cognitive status   Can travel by private vehicle     No  Equipment Recommendations  Other (comment) (defer to next level of care)    Recommendations for Other Services       Precautions / Restrictions Precautions Precautions: Fall Precaution Comments: right side hemiparesis with shoulder subluxation Restrictions Weight Bearing Restrictions Per Provider Order: No      Mobility  Bed Mobility Overal bed mobility: Needs Assistance Bed Mobility: Supine to Sit     Supine to sit: Max assist Sit to supine: +2 for physical assistance, Used rails, HOB elevated   General bed mobility comments: pt requires cues for sequencing and motor planning of hemiparetic side. assist for legs/trunk.    Transfers Overall transfer level: Needs assistance Equipment used:  (sara steady) Transfers: Bed to chair/wheelchair/BSC Sit to Stand: Via lift equipment           General transfer comment: several standing attempts in sara steady, 3 times, minA for trunk aligment and lift x2 for safety Transfer via Lift Equipment: Stedy  Ambulation/Gait                   Stairs             Wheelchair Mobility     Tilt Bed    Modified Rankin (Stroke Patients Only)       Balance Overall balance assessment: Needs assistance Sitting-balance support: Feet supported, Single extremity supported Sitting balance-Leahy Scale: Poor Sitting balance - Comments: pt with poor proprioception and awareness to hemibody. requires hands-on min-max assist for trunk control seated to maintain upright posture Postural control: Right lateral lean, Posterior lean Standing balance support: During functional activity Standing balance-Leahy Scale: Zero Standing balance comment: stedy used to facilitate WB through BLE, LUE used to power up to stand, cues for anterior weight shift                            Cognition Arousal: Alert Behavior During Therapy: Lability Overall Cognitive Status: No family/caregiver present to determine baseline cognitive functioning Area of Impairment: Attention, Memory, Following commands, Safety/judgement, Awareness  Current Attention Level: Sustained Memory: Decreased short-term memory Following Commands: Follows one step commands with increased time Safety/Judgement: Decreased awareness of safety,  Decreased awareness of deficits Awareness: Intellectual Problem Solving: Slow processing, Decreased initiation, Difficulty sequencing, Requires verbal cues, Requires tactile cues          Exercises General Exercises - Lower Extremity Long Arc Quad: AROM, Strengthening, 10 reps, Seated, Right Heel Slides: AAROM, Strengthening, Left, 10 reps, Supine    General Comments        Pertinent Vitals/Pain Pain Assessment Pain Assessment: Faces Faces Pain Scale: Hurts even more Pain Location: R shoulder Pain Descriptors / Indicators: Grimacing, Guarding, Discomfort, Crying, Moaning Pain Intervention(s): Limited activity within patient's tolerance, Monitored during session    Home Living                          Prior Function            PT Goals (current goals can now be found in the care plan section) Progress towards PT goals: Progressing toward goals    Frequency    Min 1X/week      PT Plan      Co-evaluation PT/OT/SLP Co-Evaluation/Treatment: Yes Reason for Co-Treatment: Complexity of the patient's impairments (multi-system involvement);Necessary to address cognition/behavior during functional activity;For patient/therapist safety;To address functional/ADL transfers PT goals addressed during session: Mobility/safety with mobility;Balance OT goals addressed during session: ADL's and self-care;Proper use of Adaptive equipment and DME      AM-PAC PT "6 Clicks" Mobility   Outcome Measure  Help needed turning from your back to your side while in a flat bed without using bedrails?: A Lot Help needed moving from lying on your back to sitting on the side of a flat bed without using bedrails?: A Lot Help needed moving to and from a bed to a chair (including a wheelchair)?: Total Help needed standing up from a chair using your arms (e.g., wheelchair or bedside chair)?: Total Help needed to walk in hospital room?: Total Help needed climbing 3-5 steps with a  railing? : Total 6 Click Score: 8    End of Session   Activity Tolerance: Patient tolerated treatment well Patient left: in chair;with call bell/phone within reach;with chair alarm set (with OT baseline) Nurse Communication: Mobility status PT Visit Diagnosis: Unsteadiness on feet (R26.81);Other abnormalities of gait and mobility (R26.89);Muscle weakness (generalized) (M62.81);Difficulty in walking, not elsewhere classified (R26.2);Hemiplegia and hemiparesis Hemiplegia - Right/Left: Right Hemiplegia - dominant/non-dominant: Dominant Hemiplegia - caused by: Cerebral infarction     Time: 1115-1138 PT Time Calculation (min) (ACUTE ONLY): 23 min  Charges:    $Therapeutic Activity: 8-22 mins PT General Charges $$ ACUTE PT VISIT: 1 Visit                     Olga Coaster PT, DPT 3:44 PM,11/30/23

## 2023-11-30 NOTE — Progress Notes (Signed)
Occupational Therapy Treatment Patient Details Name: Michele House MRN: [id number] DOB: [birth year] Today's Date: 11/30/2023   History of present illness Patient is a 68 year old with prolonged hospital stay, originally admitted with AMS. 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 She developed respiratory distress requiring mechanical ventilation. Self extubated 12/27 with re-intubation and extubation on 12/30. Pt also found with RLE DVT status post IVC filter placement given bleeding anticoagulation contraindicated. Seizure on 1/8 with transfer to 2nd floor.   OT comments  Pt seen with PT for co-tx to maximize functional outcomes and for pt/therapist safety. Pt requires +2 for bed mobility, maxA to transition to sitting with poor upright balance requiring min-mod assist. Pt emotional during session, requiring cues for relaxation and redirection throughout. Hemiparetic RUE causing pain at scapular and glenohumeral joint, UE protected throughout tx in makeshift sling. Stedy used to facilitate safe transfer from bed > chair, pt with good efforts using LUE to pull self upright with cues to anterior weight shift and bear weight through BLE equally. Pt presenting with increased tone + activation in digits and bicep, and able to perform neuromuscular re-ed exercises seated in recliner to facilitate motor recruitment and improve awareness of hemiparetic UE during functional task performance (see flowsheet for further details). Left positioned with pillows and sling under pt in recliner for safe lift transfer back to bed. RN updated on pt status. OT will continue to follow for functional gains. Patient will benefit from continued inpatient follow up therapy, <3 hours/day.       If plan is discharge home, recommend the following:  Assistance with cooking/housework;Assist for transportation;Help with stairs or ramp for entrance;Direct supervision/assist for medications  management;Supervision due to cognitive status;Direct supervision/assist for financial management;Two people to help with walking and/or transfers;Two people to help with bathing/dressing/bathroom   Equipment Recommendations  Other (comment)       Precautions / Restrictions Precautions Precautions: Fall Precaution Comments: right side hemiparesis with shoulder subluxation Restrictions Weight Bearing Restrictions Per Provider Order: No       Mobility Bed Mobility Overal bed mobility: Needs Assistance Bed Mobility: Supine to Sit     Supine to sit: Max assist Sit to supine: +2 for physical assistance, Used rails, HOB elevated   General bed mobility comments: pt requires cues for sequencing and motor planning of hemiparetic side. assist for legs/trunk.    Transfers Overall transfer level: Needs assistance Equipment used: Ambulation equipment used Transfers: Bed to chair/wheelchair/BSC Sit to Stand: Via lift equipment             Transfer via Lift Equipment: Stedy   Balance Overall balance assessment: Needs assistance Sitting-balance support: Feet supported, Single extremity supported Sitting balance-Leahy Scale: Poor Sitting balance - Comments: pt with poor proprioception and awareness to hemibody. requires hands-on min-max assist for trunk control seated to maintain upright posture Postural control: Right lateral lean, Posterior lean Standing balance support: During functional activity Standing balance-Leahy Scale: Zero Standing balance comment: stedy used to facilitate WB through BLE, LUE used to power up to stand, cues for anterior weight shift                           ADL either performed or assessed with clinical judgement   ADL Overall ADL's : Needs assistance/impaired  General ADL Comments: Session focused on improving upright posture and core strength while seated, functional mobility and  hemiparetic UE exercises and education    Extremity/Trunk Assessment Upper Extremity Assessment Upper Extremity Assessment: RUE deficits/detail RUE: Subluxation noted;Shoulder pain at rest;Shoulder pain with ROM             Cognition Arousal: Alert Behavior During Therapy: Lability Overall Cognitive Status: No family/caregiver present to determine baseline cognitive functioning Area of Impairment: Attention, Memory, Following commands, Safety/judgement, Awareness                   Current Attention Level: Sustained Memory: Decreased short-term memory Following Commands: Follows one step commands with increased time Safety/Judgement: Decreased awareness of safety, Decreased awareness of deficits Awareness: Intellectual Problem Solving: Slow processing, Decreased initiation, Difficulty sequencing, Requires verbal cues, Requires tactile cues          Exercises Exercises: Other exercises Other Exercises Other Exercises: Pt performing self-PROM to R hemiparetic side 90 deg+ with pain present in scapula with decresed scapulohumeral rhythm. Educated on performing self-ROM in pain - free zone below 90 degrees Other Exercises: Scapular exercises with decresed motor recruitment on hemiparetic UE d/t decreased proprioception and spatial awareness. Pt demos minimal ability to elevate/depress R scapula, trace activation noted but unable to protract/retract unilaterally or bilaterally Other Exercises: Seated in recliner RUE targeted exercises to improve proprioception, motor recruitment and facilitation of appropriate tone in hemiparetic side, pt performing x10 reps of unilateral "push-pull" reps to recruit bicep vs tricep activation outside of flexor synergy pattern Other Exercises: Edu on UE exercises (lap slides) for pt to perform HEP while seated. Pt performing x5 with OT providing min vcs for appropriate technique    Shoulder Instructions       General Comments       Pertinent Vitals/ Pain       Pain Assessment Pain Assessment: Faces Faces Pain Scale: Hurts little more Pain Location: R shoulder Pain Descriptors / Indicators: Grimacing, Guarding, Discomfort, Crying, Moaning Pain Intervention(s): Limited activity within patient's tolerance, Monitored during session   Frequency  Min 1X/week        Progress Toward Goals  OT Goals(current goals can now be found in the care plan section)  Progress towards OT goals: Progressing toward goals  Acute Rehab OT Goals OT Goal Formulation: With patient Time For Goal Achievement: 12/10/23 Potential to Achieve Goals: Fair ADL Goals Pt Will Perform Lower Body Dressing: sitting/lateral leans;with adaptive equipment;with max assist;with mod assist Pt Will Transfer to Toilet: with max assist;bedside commode;with +2 assist Pt Will Perform Toileting - Clothing Manipulation and hygiene: with max assist;sitting/lateral leans Additional ADL Goal #1: Pt will demonstrate good safety awareness during ADL/mobility tasks requiring no VC for safety, 3/3 opportunities.  Plan      Co-evaluation    PT/OT/SLP Co-Evaluation/Treatment: Yes Reason for Co-Treatment: Complexity of the patient's impairments (multi-system involvement);Necessary to address cognition/behavior during functional activity;For patient/therapist safety;To address functional/ADL transfers PT goals addressed during session: Mobility/safety with mobility;Balance OT goals addressed during session: ADL's and self-care;Proper use of Adaptive equipment and DME      AM-PAC OT "6 Clicks" Daily Activity     Outcome Measure   Help from another person eating meals?: A Little Help from another person taking care of personal grooming?: A Lot Help from another person toileting, which includes using toliet, bedpan, or urinal?: Total Help from another person bathing (including washing, rinsing, drying)?: Total Help from another person to put on and taking  off  regular upper body clothing?: A Lot Help from another person to put on and taking off regular lower body clothing?: Total 6 Click Score: 10    End of Session Equipment Utilized During Treatment: Oxygen;Other (comment) (stedy)  OT Visit Diagnosis: Muscle weakness (generalized) (M62.81);Hemiplegia and hemiparesis Hemiplegia - Right/Left: Right Hemiplegia - dominant/non-dominant: Dominant Hemiplegia - caused by: Cerebral infarction   Activity Tolerance Patient tolerated treatment well   Patient Left in chair;with call bell/phone within reach;with chair alarm set   Nurse Communication Mobility status;Need for lift equipment        Time: 1610-9604 OT Time Calculation (min): 31 min  Charges: OT General Charges $OT Visit: 1 Visit OT Treatments $Self Care/Home Management : 8-22 mins $Neuromuscular Re-education: 8-22 mins  Leston Schueller L. Rocquel Askren, OTR/L  11/30/23, 4:25 PM

## 2023-11-30 NOTE — Progress Notes (Signed)
PROGRESS NOTE   HPI was taken from Dr. Alvester Morin:  Michele House is a 68 y.o. female with medical history significant of COPD, chronic respiratory failure on 2 L, hypertension, type 2 diabetes, HFpEF presenting with acute on chronic respiratory failure with hypoxia, COPD exacerbation, pneumonia, UTI, encephalopathy.  Patient reports generalized malaise over the past 3 to 4 days.  Patient noted to have been recently admitted in the hospital November 21 through November 24 for similar issues including COPD exacerbation.  Patient reports not taking her medication since discharge.  Positive cough, shortness of breath.  Positive malaise.  Positive abdominal pain as well as dysuria.  No reported fevers or chills.  No reported nausea or vomiting.  No reported orthopnea or PND.  Weakness and malaise has progressively worsened over the similar timeframe.  Patient reports that she has stopped smoking since hospital discharge. Presented to the ER afebrile, heart rate 100s, BP stable.  Satting 96% on 3 L nasal cannula.  White count 23.4, hemoglobin 15, platelets 401, lactate 1.6, procalcitonin less than 0.1, troponin 18, urinalysis indicative of infection.  Urine drug screen negative.  Chest x-ray with a right sided pneumonia.  BNP within normal limits.  Creatinine 1.03.  Glucose 368.EKG sinus tachycardia   As per Dr. Mariane Masters: "Michele House is a 68 y.o. female with medical history significant of COPD, chronic respiratory failure on 2 L, hypertension, type 2 diabetes, HFpEF, recent discharge from the hospital 24th November 2024 after hospitalization for COPD exacerbation complicated by respiratory failure.  She presented to the hospital again on 10/02/2023 with chest pain, malaise, cough, shortness of breath, abdominal pain and dysuria.   She was admitted to the hospital for COPD exacerbation, acute on chronic hypoxemic respiratory failure, UTI and pneumonia.  She later developed worsening confusions, MRI brain  revealed multiple acute strokes.   Prolonged hospital admission complicated by several issues including hemorrhagic shock requiring transfer to ICU for pressors and GI consultation. EGD showed duodenal ulcer/s that were treated with clipped but later required embolization with IR.  Pt also found with RLE DVT status post IVC filter placement given bleeding anticoagulation contraindicated.   For detailed hospital course and significant events, see TRH progress note by Dr. Myriam Forehand of 11/09/23 and ICU notes from earlier admission.   Further hospital course and management as outlined below.  As per Dr. Mayford Knife 1/22-1/28/25: Pt has remained medically stable this week. Pt's insurance denied SNF and pt's daughter finally appealed the denial. Still waiting on the appeal decision as per CM. Pulmon signed off    Michele House  BJY:[id number] DOB: July 12, 1956 DOA: 10/02/2023 PCP: [facility]    Assessment & Plan:   Principal Problem:   Acute metabolic encephalopathy Active Problems:   CVA (cerebral vascular accident) (HCC)   Uncontrolled type 2 diabetes mellitus with hypoglycemia, without long-term current use of insulin (HCC)   Hypotension   Acute on chronic respiratory failure with hypoxia (HCC)   Hypernatremia   COPD exacerbation (HCC)   Lung nodule   Pneumonia   UTI (urinary tract infection)   (HFpEF) heart failure with preserved ejection fraction (HCC)   Essential hypertension   Type 2 diabetes mellitus with obesity (HCC)   Melena   Gastrointestinal hemorrhage with melena   Duodenal ulcer   Acute deep vein thrombosis (DVT) of right lower extremity (HCC)   Bilateral leg pain  Assessment and Plan: Acute metabolic encephalopathy: much improved. Completed steroid course. Ddx included autoimmune encephalitis. Delirium precautions.  Avoid benzos & sedating meds   Acute CVA: with right hemiplegia. Off of Plavix because of recent acute GI bleeding. 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   Possible seizure: on evening of 11/10/23. Continue on keppra as per neuro    Acute GI bleeding: secondary to bleeding duodenal ulcer. S/p EGD on 10/28/2023 with clip placed.   S/p IR embolization on 10/30/2023. Continue on PPI   Right peroneal DVT: initially refused ultrasound of the left lower extremity because of severe pain.  Ultrasound of the right lower extremity showed right calf DVT. Due to high risk of recurrent GI bleeding given recent massive GI bleed requiring embolization by IR, unable to treat with anticoagulation. Vascular surgery placed IVC filter on 11/09/23   Hemorrhagic shock: Resolved  Hypotension: resolved    Acute blood loss anemia: H&H are labile. S/p 2 units of pRBCs transfused so far. S/p transfusion with 2 units of platelet transfusions.   Pneumonia: initially completed 5 days of IV ceftriaxone and azithromycin on 10/07/2023. Restarted on broad-spectrum antibiotics (vancomycin, ceftriaxone and Flagyl) on 10/28/2023 when she decompensated requiring transfer to the ICU. Completed abx course x 2.   Acute UTI: urine cx showed strep agalactiae.  Completed antibiotics.   Acute on chronic diastolic CHF: continue on lasix. Monitor I/Os.    COPD exacerbation: completed steroid course x2. Bronchodilators prn. Pulmon recs apprec and signed off 11/25/23    Acute on chronic hypoxemic respiratory failure & chronic hypercapnic respiratory failure: was intubated on 10/28/2023 and extubated on 11/01/2023. BiPAP qhs   DM2: likely poorly controlled. Continue on glargine, SSI w/ accuchecks   Anxiety/panic attacks: severity unknown. Continue on home dose of sertraline   Non-small cell carcinoma -- pt found to have 1.3 cm right upper lung spiculated nodule: S/p bronchoscopy on  10/20/2023.  Pathology showed rare atypical cells, but unclear if malignancy and findings more consistent with reactive bronchial cells with acute inflammation. Lung biopsy repeated 1/10, results today per Dr. Alena Bills note. F/u outpatient w/ onco    Hypothyroidism: continue on levothyroxine/  Daughter requested 1/11 to have thyroid labs repeated, citing concern for possible Hashimoto's Encephalopathy causing patient's persistent mental status changes.  Advised acute setting and recent illnesses often make TSH and T4 abnormal and not very useful clinically. Negative anti-TPO Ab (obtained at daughter's request as she has hashimoto's)   Physical debility : PT/OT recs SNF. Pt's insurance denied SNF.  Appeal decision is still pending as per CM       DVT prophylaxis: SCDs Code Status: DNR Family Communication:  Disposition Plan: pt's insurance denied SNF placement. Pt's daughter appealed the denial and still waiting to hear back from pt's insurance. Appeal decision is still pending   Level of care: Telemetry Medical  Status is: Inpatient Remains inpatient appropriate because: medically stable. Needs SNF placement but pt's insurance denied SNF and pt's daughter appealed the denial. Appeal decision is still pending     Consultants:  GI Pulmon   Procedures:   Antimicrobials:    Subjective: Pt c/o fatigue   Objective: Vitals:   11/29/23 2050 11/30/23 0301 11/30/23 0500 11/30/23 0801  BP: (!) 115/103  (!) 142/86 120/67  Pulse: (!) 107  89 94  Resp: 18  18 18   Temp: 98.1 F (36.7 C)  97.8 F (36.6 C) 98.2 F (36.8 C)  TempSrc:  Oral  Oral   SpO2: 96%  96% 99%  Weight:  70.2 kg    Height:        Intake/Output Summary (Last 24 hours) at 11/30/2023 0826 Last data filed at 11/30/2023 0420 Gross per 24 hour  Intake 240 ml  Output 200 ml  Net 40 ml   Filed Weights   11/28/23 0500 11/29/23 0219 11/30/23 0301  Weight: 67.2 kg 67.9 kg 70.2 kg    Examination:  General exam:  appears calm & comfortable  Respiratory system: decreased breath sounds b/l  Cardiovascular system: S1 & S2+. No rubs or clicks  Gastrointestinal system: abd is soft, NT, ND & normal bowel sounds  Central nervous system: alert & awake. Moves all extremities  Psychiatry: judgement and insight appears at baseline. Flat mood and affect    Data Reviewed: I have personally reviewed following labs and imaging studies  CBC: Recent Labs  Lab 11/26/23 0538  WBC 4.6  HGB 9.9*  HCT 30.8*  MCV 89.3  PLT 254   Basic Metabolic Panel: Recent Labs  Lab 11/26/23 0538  NA 141  K 3.5  CL 102  CO2 29  GLUCOSE 143*  BUN 21  CREATININE 0.65  CALCIUM 9.1   GFR: Estimated Creatinine Clearance: 58 mL/min (by C-G formula based on SCr of 0.65 mg/dL). Liver Function Tests: No results for input(s): "AST", "ALT", "ALKPHOS", "BILITOT", "PROT", "ALBUMIN" in the last 168 hours. No results for input(s): "LIPASE", "AMYLASE" in the last 168 hours. No results for input(s): "AMMONIA" in the last 168 hours. Coagulation Profile: No results for input(s): "INR", "PROTIME" in the last 168 hours. Cardiac Enzymes: No results for input(s): "CKTOTAL", "CKMB", "CKMBINDEX", "TROPONINI" in the last 168 hours. BNP (last 3 results) No results for input(s): "PROBNP" in the last 8760 hours. HbA1C: No results for input(s): "HGBA1C" in the last 72 hours. CBG: Recent Labs  Lab 11/29/23 0830 11/29/23 1222 11/29/23 1647 11/29/23 2138 11/30/23 0800  GLUCAP 212* 127* 132* 184* 165*   Lipid Profile: No results for input(s): "CHOL", "HDL", "LDLCALC", "TRIG", "CHOLHDL", "LDLDIRECT" in the last 72 hours. Thyroid Function Tests: No results for input(s): "TSH", "T4TOTAL", "FREET4", "T3FREE", "THYROIDAB" in the last 72 hours. Anemia Panel: No results for input(s): "VITAMINB12", "FOLATE", "FERRITIN", "TIBC", "IRON", "RETICCTPCT" in the last 72 hours. Sepsis Labs: No results for input(s): "PROCALCITON", "LATICACIDVEN" in  the last 168 hours.  No results found for this or any previous visit (from the past 240 hours).       Radiology Studies: DG Humerus Right Result Date: 11/29/2023 CLINICAL DATA:  Right arm pain. EXAM: RIGHT HUMERUS - 2+ VIEW COMPARISON:  None Available. FINDINGS: Two views are submitted which are not orthogonal. The mineralization and alignment are normal. There is no evidence of acute fracture or dislocation. Mild degenerative changes at the acromioclavicular joint. The glenohumeral and elbow joint spaces appear preserved. There are small punctate soft tissue calcifications distally in the upper arm without evidence of foreign body. IMPRESSION: No evidence of acute fracture or dislocation. Mild acromioclavicular degenerative changes. Electronically Signed   By: Carey Bullocks M.D.   On: 11/29/2023 15:22        Scheduled Meds:  arformoterol  15 mcg Nebulization BID   docusate sodium  100 mg Oral BID   enoxaparin (LOVENOX) injection  40 mg Subcutaneous QHS   furosemide  20 mg Oral Daily   insulin aspart  0-5 Units Subcutaneous QHS   insulin aspart  0-9 Units Subcutaneous TID WC  insulin aspart  4 Units Subcutaneous TID WC   insulin glargine-yfgn  18 Units Subcutaneous Daily   ipratropium-albuterol  3 mL Nebulization BID   levETIRAcetam  500 mg Oral BID   levothyroxine  150 mcg Oral Q0600   lidocaine  1 patch Transdermal Q24H   multivitamin with minerals  1 tablet Oral Daily   mouth rinse  15 mL Mouth Rinse BID   pantoprazole  40 mg Oral BID AC   polyethylene glycol  17 g Oral Daily   rOPINIRole  4 mg Oral QHS   senna-docusate  1 tablet Oral Daily   sertraline  50 mg Oral Daily   Continuous Infusions:   LOS: 59 days      Charise Killian, MD [facility] Pager [phone]  If 7PM-7AM, please contact night-coverage [email or web address] 11/30/2023, 8:26 AM

## 2023-11-30 NOTE — TOC Progression Note (Signed)
Transition of Care [facility]) - Progression Note    Patient Details  Name: Michele House MRN: [id number] Date of Birth: 05-May-1956  [facility]) CM/SW Contact  Garret Reddish, RN Phone Number: 11/30/2023, 9:27 AM  Clinical Narrative:    Chart reviewed.  SNF appeal still pending.  I spoke with the plan today and they report that the appeal decision should result tonight or early tomorrow.    TOC will continue to follow progress of patient.     Expected Discharge Plan: Skilled Nursing Facility Barriers to Discharge: Continued Medical Work up  Expected Discharge Plan and Services         Expected Discharge Date: 11/09/23                                     Social Determinants of Health (SDOH) Interventions SDOH Screenings   Food Insecurity: Patient Unable To Answer (10/03/2023)  Recent Concern: Food Insecurity - Food Insecurity Present (09/24/2023)  Housing: High Risk (10/03/2023)  Transportation Needs: Patient Unable To Answer (10/03/2023)  Recent Concern: Transportation Needs - Unmet Transportation Needs (09/24/2023)  Utilities: Patient Unable To Answer (10/03/2023)  Social Connections: Patient Declined (11/02/2023)  Tobacco Use: Medium Risk (11/12/2023)    Readmission Risk Interventions     No data to display

## 2023-11-30 NOTE — Progress Notes (Signed)
Mobility Specialist - Progress Note   11/30/23 1415  Mobility  Activity Stood at bedside  Level of Assistance Minimal assist, patient does 75% or more (+2)  Assistive Device Stedy  Activity Response Tolerated well  Mobility visit 1 Mobility  Mobility Specialist Start Time (ACUTE ONLY) 1349  Mobility Specialist Stop Time (ACUTE ONLY) 1411  Mobility Specialist Time Calculation (min) (ACUTE ONLY) 22 min   Pt sitting in the recliner upon entry, utilizing [place]. Pt required MinA to initiate trunk flexion to prepare for standing--- reporting dizziness and "not knowing where I am" while seated in the recliner. Pt STS to stedy MinA +2, hand over hand required to place RUE on the railing of the stedy. Pt required CGA during transfer EOB d/t R lateral lean while seated in the stedy. Pt returned supine, left with alarm set and needs within reach.  Zetta Bills Mobility Specialist 11/30/23 2:24 PM

## 2023-11-30 NOTE — Plan of Care (Signed)
Problem: Education: Goal: Ability to describe self-care measures that may prevent or decrease complications (Diabetes Survival Skills Education) will improve Outcome: Progressing   Problem: Coping: Goal: Ability to adjust to condition or change in health will improve Outcome: Progressing   Problem: Fluid Volume: Goal: Ability to maintain a balanced intake and output will improve Outcome: Progressing   Problem: Health Behavior/Discharge Planning: Goal: Ability to identify and utilize available resources and services will improve Outcome: Progressing Goal: Ability to manage health-related needs will improve Outcome: Progressing   Problem: Metabolic: Goal: Ability to maintain appropriate glucose levels will improve Outcome: Progressing   Problem: Nutritional: Goal: Maintenance of adequate nutrition will improve Outcome: Progressing Goal: Progress toward achieving an optimal weight will improve Outcome: Progressing   Problem: Skin Integrity: Goal: Risk for impaired skin integrity will decrease Outcome: Progressing   Problem: Tissue Perfusion: Goal: Adequacy of tissue perfusion will improve Outcome: Progressing   Problem: Education: Goal: Knowledge of General Education information will improve Description: Including pain rating scale, medication(s)/side effects and non-pharmacologic comfort measures Outcome: Progressing   Problem: Health Behavior/Discharge Planning: Goal: Ability to manage health-related needs will improve Outcome: Progressing   Problem: Clinical Measurements: Goal: Ability to maintain clinical measurements within normal limits will improve Outcome: Progressing Goal: Will remain free from infection Outcome: Progressing Goal: Diagnostic test results will improve Outcome: Progressing Goal: Respiratory complications will improve Outcome: Progressing Goal: Cardiovascular complication will be avoided Outcome: Progressing   Problem: Activity: Goal:  Risk for activity intolerance will decrease Outcome: Progressing   Problem: Nutrition: Goal: Adequate nutrition will be maintained Outcome: Progressing   Problem: Coping: Goal: Level of anxiety will decrease Outcome: Progressing   Problem: Elimination: Goal: Will not experience complications related to bowel motility Outcome: Progressing Goal: Will not experience complications related to urinary retention Outcome: Progressing   Problem: Pain Management: Goal: General experience of comfort will improve Outcome: Progressing   Problem: Safety: Goal: Ability to remain free from injury will improve Outcome: Progressing   Problem: Skin Integrity: Goal: Risk for impaired skin integrity will decrease Outcome: Progressing   Problem: Education: Goal: Knowledge of disease or condition will improve Outcome: Progressing Goal: Knowledge of the prescribed therapeutic regimen will improve Outcome: Progressing Goal: Individualized Educational Video(s) Outcome: Progressing   Problem: Activity: Goal: Ability to tolerate increased activity will improve Outcome: Progressing Goal: Will verbalize the importance of balancing activity with adequate rest periods Outcome: Progressing   Problem: Respiratory: Goal: Ability to maintain a clear airway will improve Outcome: Progressing Goal: Levels of oxygenation will improve Outcome: Progressing Goal: Ability to maintain adequate ventilation will improve Outcome: Progressing   Problem: Activity: Goal: Ability to tolerate increased activity will improve Outcome: Progressing   Problem: Clinical Measurements: Goal: Ability to maintain a body temperature in the normal range will improve Outcome: Progressing   Problem: Respiratory: Goal: Ability to maintain adequate ventilation will improve Outcome: Progressing Goal: Ability to maintain a clear airway will improve Outcome: Progressing   Problem: Education: Goal: Knowledge of disease or  condition will improve Outcome: Progressing Goal: Knowledge of secondary prevention will improve (MUST DOCUMENT ALL) Outcome: Progressing Goal: Knowledge of patient specific risk factors will improve Loraine Leriche N/A or DELETE if not current risk factor) Outcome: Progressing   Problem: Ischemic Stroke/TIA Tissue Perfusion: Goal: Complications of ischemic stroke/TIA will be minimized Outcome: Progressing   Problem: Coping: Goal: Will verbalize positive feelings about self Outcome: Progressing Goal: Will identify appropriate support needs Outcome: Progressing   Problem: Health Behavior/Discharge Planning: Goal: Ability to manage  health-related needs will improve Outcome: Progressing Goal: Goals will be collaboratively established with patient/family Outcome: Progressing   Problem: Self-Care: Goal: Ability to participate in self-care as condition permits will improve Outcome: Progressing Goal: Verbalization of feelings and concerns over difficulty with self-care will improve Outcome: Progressing Goal: Ability to communicate needs accurately will improve Outcome: Progressing

## 2023-12-01 DIAGNOSIS — G9341 Metabolic encephalopathy: Secondary | ICD-10-CM | POA: Diagnosis not present

## 2023-12-01 LAB — GLUCOSE, CAPILLARY
Glucose-Capillary: 223 mg/dL — ABNORMAL HIGH (ref 70–99)
Glucose-Capillary: 212 mg/dL — ABNORMAL HIGH (ref 70–99)
Glucose-Capillary: 165 mg/dL — ABNORMAL HIGH (ref 70–99)
Glucose-Capillary: 198 mg/dL — ABNORMAL HIGH (ref 70–99)

## 2023-12-01 NOTE — Progress Notes (Signed)
Mobility Specialist - Progress Note   12/01/23 1138  Mobility  Activity Stood at bedside;Transferred to/from [facility];Transferred from bed to chair  Level of Assistance Minimal assist, patient does 75% or more  Assistive Device Stedy  Activity Response Tolerated well  Mobility visit 1 Mobility  Mobility Specialist Start Time (ACUTE ONLY) [id number]  Mobility Specialist Stop Time (ACUTE ONLY) 1018  Mobility Specialist Time Calculation (min) (ACUTE ONLY) 39 min   Pt simi fowler upon entry, utilizing 2L [place]. Pt motivated and agreeable to transfer to the recliner this date-- became tearful when asked how she was doing, expressed lack of help from staff. Pt also upset that she isn't getting therapy daily. Pt completed bed mob ModA for trunk support, required no assist to bring BLE EOB-- Pt uses LLE to kick/slide RLE off the EOB, extra time required. Once seated EOB Pt required CGA to support trunk. Pt STS to stedy MinA +2, hand over hand required to place RUE onto the railing and transferred to the recliner-- Pt reported feeling of BM upon descent to recliner. Pt STS to stedy MinA+2 and transferred to the [facility]. While seated on BSC, MS detangles/remove knots from pt's hair, putting hair in two braids when finished-- constant reposition on the [facility] required. Pt STS to stedy ModA +2 requiring more assistance d/t duration spent on the [facility], ModA for peri care and to don undergarments. Pt transfer to the recliner, left seated with alarm set and needs within reach. RN notified.        Zetta Bills Mobility Specialist 12/01/23 11:53 AM

## 2023-12-01 NOTE — Progress Notes (Signed)
PROGRESS NOTE    Michele House  ZOX:[id number] DOB: [birth year] DOA: 10/02/2023 PCP: [facility]  121A/121A-AA  LOS: 60 days   Brief hospital course:   Assessment & Plan: Michele House is a 68 y.o. female with medical history significant of COPD, chronic respiratory failure on 2 L, hypertension, type 2 diabetes, HFpEF presenting with acute on chronic respiratory failure with hypoxia, COPD exacerbation, pneumonia, UTI, encephalopathy.  Patient reports generalized malaise over the past 3 to 4 days.  Patient noted to have been recently admitted in the hospital November 21 through November 24 for similar issues including COPD exacerbation.  Patient reports not taking her medication since discharge.   As per Dr. Mariane Masters: "Michele House is a 68 y.o. female with medical history significant of COPD, chronic respiratory failure on 2 L, hypertension, type 2 diabetes, HFpEF, recent discharge from the hospital 24th November 2024 after hospitalization for COPD exacerbation complicated by respiratory failure.  She presented to the hospital again on 10/02/2023 with chest pain, malaise, cough, shortness of breath, abdominal pain and dysuria.   She was admitted to the hospital for COPD exacerbation, acute on chronic hypoxemic respiratory failure, UTI and pneumonia.  She later developed worsening confusions, MRI brain revealed multiple acute strokes.   Prolonged hospital admission complicated by several issues including hemorrhagic shock requiring transfer to ICU for pressors and GI consultation. EGD showed duodenal ulcer/s that were treated with clipped but later required embolization with IR.  Pt also found with RLE DVT status post IVC filter placement given bleeding anticoagulation contraindicated.   For detailed hospital course and significant events, see TRH progress note by Dr. Myriam Forehand of 11/09/23 and ICU notes from earlier admission.   Further hospital course and management as  outlined below.   As per Dr. Mayford Knife 1/22-1/28/25: Pt has remained medically stable this week. Pt's insurance denied SNF and pt's daughter finally appealed the denial. Still waiting on the appeal decision as per CM. Pulmon signed off   Acute metabolic encephalopathy: much improved. Completed steroid course. Ddx included autoimmune encephalitis. Delirium precautions. Avoid benzos & sedating meds   Acute CVA: with right hemiplegia. Off of Plavix because of recent acute GI bleeding. 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   Possible seizure: on evening of 11/10/23. Continue on keppra as per neuro    Acute GI bleeding: secondary to bleeding duodenal ulcer. S/p EGD on 10/28/2023 with clip placed.   S/p IR embolization on 10/30/2023. Continue on PPI    Right peroneal DVT: initially refused ultrasound of the left lower extremity because of severe pain.  Ultrasound of the right lower extremity showed right calf DVT. Due to high risk of recurrent GI bleeding given recent massive GI bleed requiring embolization by IR, unable to treat with anticoagulation. Vascular surgery placed IVC filter on 11/09/23   Hemorrhagic shock: Resolved   Hypotension: resolved    Acute blood loss anemia: H&H are labile. S/p 2 units of pRBCs transfused so far. S/p transfusion with 2 units of platelet transfusions.   Pneumonia: initially completed 5 days of IV ceftriaxone and azithromycin on 10/07/2023. Restarted on broad-spectrum antibiotics (vancomycin, ceftriaxone and Flagyl) on 10/28/2023 when she decompensated requiring transfer to the ICU. Completed abx course x 2.    Acute UTI: urine cx showed strep agalactiae.  Completed antibiotics.   Acute  on chronic diastolic CHF: continue on lasix. Monitor I/Os.    COPD exacerbation:  completed steroid course x2. Bronchodilators prn. Pulmon recs apprec and signed off 11/25/23    Acute on chronic hypoxemic respiratory failure & chronic hypercapnic respiratory failure: was intubated on 10/28/2023 and extubated on 11/01/2023. BiPAP qhs   DM2: likely poorly controlled. Continue on glargine, SSI w/ accuchecks    Anxiety/panic attacks: severity unknown. Continue on home dose of sertraline    Non-small cell carcinoma -- pt found to have 1.3 cm right upper lung spiculated nodule: S/p bronchoscopy on 10/20/2023.  Pathology showed rare atypical cells, but unclear if malignancy and findings more consistent with reactive bronchial cells with acute inflammation. Lung biopsy repeated 1/10, results today per Dr. Alena Bills note. F/u outpatient w/ onco    Hypothyroidism: continue on levothyroxine/  Daughter requested 1/11 to have thyroid labs repeated, citing concern for possible Hashimoto's Encephalopathy causing patient's persistent mental status changes.  Advised acute setting and recent illnesses often make TSH and T4 abnormal and not very useful clinically. Negative anti-TPO Ab (obtained at daughter's request as she has hashimoto's)   Physical debility : PT/OT recs SNF. Pt's insurance denied SNF.  Appeal decision is still pending as per CM     DVT prophylaxis: Lovenox SQ Code Status: DNR  Family Communication:  Level of care: Telemetry Medical Dispo:   The patient is from: home Anticipated d/c is to: to be determined Anticipated d/c date is: when disposition found   Subjective and Interval History:  Pt reported numbness in her legs.   Objective: Vitals:   12/01/23 0909 12/01/23 1638 12/01/23 2008 12/01/23 2059  BP: 132/72 (!) 141/71 (!) 147/128   Pulse: 96 90 89   Resp: 18 18 18    Temp: (!) 97.5 F (36.4 C) (!) 97.4 F (36.3 C) 97.7 F (36.5 C)   TempSrc:      SpO2: 96% 98% 99% 98%  Weight:      Height:        Intake/Output Summary (Last 24 hours) at 12/01/2023  2132 Last data filed at 12/01/2023 0820 Gross per 24 hour  Intake 237 ml  Output 200 ml  Net 37 ml   Filed Weights   11/29/23 0219 11/30/23 0301 12/01/23 0230  Weight: 67.9 kg 70.2 kg 76.5 kg    Examination:   Constitutional: NAD, AAOx3 HEENT: conjunctivae and lids normal, EOMI CV: No cyanosis.   RESP: normal respiratory effort, on 2L Neuro: II - XII grossly intact.   Psych: Normal mood and affect.  Appropriate judgement and reason   Data Reviewed: I have personally reviewed labs and imaging studies  Time spent: 25 minutes  Darlin Priestly, MD [facility] If 7PM-7AM, please contact night-coverage 12/01/2023, 9:32 PM

## 2023-12-01 NOTE — TOC Progression Note (Signed)
Transition of Care [facility]) - Progression Note    Patient Details  Name: Michele House MRN: [id number] Date of Birth: July 26, 1956  Transition of Care [facility]) CM/SW Contact  Garret Reddish, RN Phone Number: 12/01/2023, 4:08 PM  Clinical Narrative:    Chart reviewed.  Appeal continues to be pending.  TOC will continue to follow for discharge planning.    Expected Discharge Plan: Skilled Nursing Facility Barriers to Discharge: Continued Medical Work up  Expected Discharge Plan and Services         Expected Discharge Date: 11/09/23                                     Social Determinants of Health (SDOH) Interventions SDOH Screenings   Food Insecurity: Patient Unable To Answer (10/03/2023)  Recent Concern: Food Insecurity - Food Insecurity Present (09/24/2023)  Housing: High Risk (10/03/2023)  Transportation Needs: Patient Unable To Answer (10/03/2023)  Recent Concern: Transportation Needs - Unmet Transportation Needs (09/24/2023)  Utilities: Patient Unable To Answer (10/03/2023)  Social Connections: Patient Declined (11/02/2023)  Tobacco Use: Medium Risk (11/12/2023)    Readmission Risk Interventions     No data to display

## 2023-12-02 DIAGNOSIS — G9341 Metabolic encephalopathy: Secondary | ICD-10-CM | POA: Diagnosis not present

## 2023-12-02 LAB — GLUCOSE, CAPILLARY
Glucose-Capillary: 148 mg/dL — ABNORMAL HIGH (ref 70–99)
Glucose-Capillary: 180 mg/dL — ABNORMAL HIGH (ref 70–99)
Glucose-Capillary: 169 mg/dL — ABNORMAL HIGH (ref 70–99)
Glucose-Capillary: 157 mg/dL — ABNORMAL HIGH (ref 70–99)

## 2023-12-02 MED ORDER — ROPINIROLE HCL ER 4 MG PO TB24: 4.0000 mg | ORAL_TABLET | Freq: Every day | ORAL | Status: DC

## 2023-12-02 MED ORDER — DOCUSATE SODIUM 100 MG PO CAPS: 100.0000 mg | ORAL_CAPSULE | Freq: Two times a day (BID) | ORAL | Status: DC

## 2023-12-02 MED ORDER — LEVETIRACETAM 500 MG PO TABS: 500.0000 mg | ORAL_TABLET | Freq: Two times a day (BID) | ORAL | Status: DC

## 2023-12-02 MED ORDER — INSULIN GLARGINE-YFGN 100 UNIT/ML ~~LOC~~ SOLN: 18.0000 [IU] | Freq: Every day | SUBCUTANEOUS | Status: DC

## 2023-12-02 MED ORDER — PANTOPRAZOLE SODIUM 40 MG PO TBEC: 40.0000 mg | DELAYED_RELEASE_TABLET | Freq: Two times a day (BID) | ORAL | Status: DC

## 2023-12-02 MED ORDER — INSULIN ASPART 100 UNIT/ML IJ SOLN: 4.0000 [IU] | Freq: Three times a day (TID) | INTRAMUSCULAR | Status: DC

## 2023-12-02 MED ORDER — LEVOTHYROXINE SODIUM 150 MCG PO TABS: 150.0000 ug | ORAL_TABLET | Freq: Every day | ORAL | Status: DC

## 2023-12-02 MED ORDER — POLYETHYLENE GLYCOL 3350 17 G PO PACK: 17.0000 g | PACK | Freq: Every day | ORAL | Status: DC

## 2023-12-02 NOTE — TOC Progression Note (Signed)
Transition of Care [facility]) - Progression Note    Patient Details  Name: Michele House MRN: [id number] Date of Birth: 11-10-1955  [facility] CM/SW Contact  Garret Reddish, RN Phone Number: 12/02/2023, 10:23 AM  Clinical Narrative:     Chart reviewed.  I have spoken with the [facility] appeals department.  Appeals department reports appeal have been over  turned.  Appeal approval not showing up in [employer] Portal at this time.  May take 24 hours for appeal decision to show up in portal.  I have informed Tammy with [facility] about the appeal overturn.  Will await approval information to show in [employer] Portal.  Will continue to follow portal for approval information.      Expected Discharge Plan: Skilled Nursing Facility Barriers to Discharge: Continued Medical Work up  Expected Discharge Plan and Services         Expected Discharge Date: 11/09/23                                     Social Determinants of Health (SDOH) Interventions SDOH Screenings   Food Insecurity: Patient Unable To Answer (10/03/2023)  Recent Concern: Food Insecurity - Food Insecurity Present (09/24/2023)  Housing: High Risk (10/03/2023)  Transportation Needs: Patient Unable To Answer (10/03/2023)  Recent Concern: Transportation Needs - Unmet Transportation Needs (09/24/2023)  Utilities: Patient Unable To Answer (10/03/2023)  Social Connections: Patient Declined (11/02/2023)  Tobacco Use: Medium Risk (11/12/2023)    Readmission Risk Interventions     No data to display

## 2023-12-02 NOTE — Progress Notes (Signed)
Report called for Pt to [facility] , all questions answered.

## 2023-12-02 NOTE — Progress Notes (Signed)
Physical Therapy Treatment Patient Details Name: Michele House MRN: [id number] DOB: [birth year] Today's Date: 12/02/2023   History of Present Illness Patient is a 68 year old with prolonged hospital stay, originally admitted with AMS. 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 She developed respiratory distress requiring mechanical ventilation. Self extubated 12/27 with re-intubation and extubation on 12/30. Pt also found with RLE DVT status post IVC filter placement given bleeding anticoagulation contraindicated. Seizure on 1/8 with transfer to 2nd floor.    PT Comments  Pt was R sidelying upon arrival. She is alert but instantly started crying (lability) when she author enters room." Ive been waiting for you. I want to go home." Pt is easily redirected to focus on desired task. She puts forth great effort throughout session but does fatigue quickly. Overall poor insight of situation and poor safety awareness. Pt was able to roll R to short sit with assistance + tcs/vcs. Pt presents with poor/ inconsistent sitting balance. Actually demonstrated better standing balance versus sitting balance. She tolerated EOB standing 4 x with author blocking feet/knees for safety.  She tolerated standing x ~ 20-30 sec. Each time and even was able to march in place with RLE however unable to accept full wt on RLE to clear LLE. Highly recommend stand pivot towards L if not using mechanical lift/ sara steady. Overall pt tolerated session well but is limited by fatigue. Acute PT will continue to follow and progress per current POC.    If plan is discharge home, recommend the following: A lot of help with walking and/or transfers;A lot of help with bathing/dressing/bathroom;Assistance with cooking/housework;Direct supervision/assist for medications management;Direct supervision/assist for financial management;Assist for transportation;Help with stairs or ramp for entrance;Supervision due to  cognitive status     Equipment Recommendations  Other (comment) (Defer to next level of care)       Precautions / Restrictions Precautions Precautions: Fall Precaution Comments: R side weakness> L side Restrictions Weight Bearing Restrictions Per Provider Order: No     Mobility  Bed Mobility Overal bed mobility: Needs Assistance Bed Mobility: Supine to Sit, Sit to Supine Rolling: Min assist Supine to sit: Mod assist, Max assist Sit to supine: Mod assist, Max assist General bed mobility comments: pt needs TCs+ VCs for sequencing techniques to safely achieve EOB sitting. Sitting balance worse in sitting versus standing. INconsistent presentation throughout all EOB task/exercises. assistance ranged from close supervision to mod-max to maintain sitting balance. Does have active movements in all extremities    Transfers Overall transfer level: Needs assistance Equipment used: None (author blocked knees and feet to prevent feet sliding anteriorly) Transfers: Sit to/from Stand Sit to Stand: Mod assist  General transfer comment: pt stood EOB 4 x without assisting device however BUE supported on therapist. Author blocked feet to prevent anterior sliding. Mod assist to initiate trnsfer however pt was able to stand once movement/ transfer started. Pt static stood 4 x ~ 20-30 sec. was able to march with RLE while maintaining LLE stance. unable to safely attempt marching with LLE due to poor wt acceptnce/RLE wt tolerance. pt does fatigue quickly but overall puts forth great effort.    Ambulation/Gait  General Gait Details: currently unable   Balance Overall balance assessment: Needs assistance Sitting-balance support: Bilateral upper extremity supported, Feet supported Sitting balance-Leahy Scale: Poor     Standing balance support: Bilateral upper extremity supported, During functional activity Standing balance-Leahy Scale: Poor       Cognition Arousal: Alert  Behavior During Therapy:  Lability Overall Cognitive Status: Impaired/Different from baseline Area of Impairment: Safety/judgement, Awareness, Problem solving    Following Commands: Follows one step commands with increased time Safety/Judgement: Decreased awareness of safety, Decreased awareness of deficits Awareness: Intellectual Problem Solving: Slow processing, Decreased initiation, Difficulty sequencing, Requires verbal cues, Requires tactile cues General Comments: Pt is alert and very motivated to participate. Still lacks insight of deficits with opoor awareness overall. " I want to go hoem today."        Exercises General Exercises - Lower Extremity Ankle Circles/Pumps: AROM, Strengthening, Left, 10 reps, Supine Quad Sets: AROM, Strengthening, Left, 10 reps, Supine Long Arc Quad: Both, AROM, 10 reps      PT Goals (current goals can now be found in the care plan section) Acute Rehab PT Goals Patient Stated Goal: "Get better so I can get home."    Frequency    Min 1X/week       Co-evaluation     PT goals addressed during session: Mobility/safety with mobility;Balance;Strengthening/ROM        AM-PAC PT "6 Clicks" Mobility   Outcome Measure  Help needed turning from your back to your side while in a flat bed without using bedrails?: A Lot Help needed moving from lying on your back to sitting on the side of a flat bed without using bedrails?: A Lot Help needed moving to and from a bed to a chair (including a wheelchair)?: A Lot Help needed standing up from a chair using your arms (e.g., wheelchair or bedside chair)?: Total Help needed to walk in hospital room?: Total Help needed climbing 3-5 steps with a railing? : Total 6 Click Score: 9    End of Session Equipment Utilized During Treatment: Gait belt Activity Tolerance: Patient tolerated treatment well;Patient limited by fatigue Patient left: in bed;with call bell/phone within reach;with bed alarm set Nurse Communication: Mobility  status PT Visit Diagnosis: Unsteadiness on feet (R26.81);Other abnormalities of gait and mobility (R26.89);Muscle weakness (generalized) (M62.81);Difficulty in walking, not elsewhere classified (R26.2);Hemiplegia and hemiparesis Hemiplegia - Right/Left: Right Hemiplegia - dominant/non-dominant: Dominant Hemiplegia - caused by: Cerebral infarction     Time:  -     Charges:                            Jetta Lout PTA 12/02/23, 7:53 AM

## 2023-12-02 NOTE — TOC Transition Note (Signed)
Transition of Care [facility]) - Discharge Note   Patient Details  Name: Michele House MRN: [id number] Date of Birth: 1956-08-17  Transition of Care [facility]) CM/SW Contact:  Garret Reddish, RN Phone Number: 12/02/2023, 3:31 PM   Clinical Narrative:    Chart reviewed. Noted that patient has orders for discharge today.  I have spoken with [facility] department and they inform me that appeal has been approved.  I have spoken with Tammy with [facility] and she informs me that she will have a bed for patient today.  Tammy informs me that patient will go to bed 609 and the number to call report is [phone].  I have sent Tammy patient's Discharge Summary, SNF Transfer Report, and Discharge orders via [employer].  I have made patient's daughter Gunnar Fusi aware that SNF appeal has been approved and patient will discharge to [facility] today.   I have arranged [facility] to transport patient to [facility] today.  I have made staff nurse aware.     Final next level of care: Skilled Nursing Facility Barriers to Discharge: No Barriers Identified   Patient Goals and CMS Choice   CMS Medicare.gov Compare Post Acute Care list provided to:: Patient Choice offered to / list presented to : Patient      Discharge Placement                Patient to be transferred to facility by: [facility] Name of family member notified: Gunnar Fusi ( patient's daughter) Patient and family notified of of transfer: 12/02/23  Discharge Plan and Services Additional resources added to the After Visit Summary for                                       Social Drivers of Health (SDOH) Interventions SDOH Screenings   Food Insecurity: Patient Unable To Answer (10/03/2023)  Recent Concern: Food Insecurity - Food Insecurity Present (09/24/2023)  Housing: High Risk (10/03/2023)  Transportation Needs: Patient Unable To Answer (10/03/2023)  Recent Concern: Transportation Needs - Unmet  Transportation Needs (09/24/2023)  Utilities: Patient Unable To Answer (10/03/2023)  Social Connections: Patient Declined (11/02/2023)  Tobacco Use: Medium Risk (11/12/2023)     Readmission Risk Interventions     No data to display

## 2023-12-02 NOTE — Discharge Summary (Signed)
Physician Discharge Summary   Michele House  female DOB: [birth year]  GNF:[id number]  PCP: [facility]  Admit date: 10/02/2023 Discharge date: 12/02/2023  Admitted From: home Disposition:  SNF rehab CODE STATUS: DNR  Discharge Instructions     Diet - low sodium heart healthy   Complete by: As directed    Diet Carb Modified   Complete by: As directed    No wound care   Complete by: As directed       Hospital Course:  For full details, please see H&P, progress notes, consult notes and ancillary notes.  Briefly,  HPI was taken from Dr. Alvester Morin: Michele House is a 68 y.o. female with medical history significant of COPD, chronic respiratory failure on 2 L, hypertension, type 2 diabetes, HFpEF presenting with with chest pain, malaise, cough, shortness of breath, abdominal pain and dysuria.  Patient noted to have been recently admitted in the hospital November 21 through November 24 for similar issues including COPD exacerbation.  Patient reports not taking her medication since discharge.       Pt was admitted to the hospital for COPD exacerbation, acute on chronic hypoxemic respiratory failure, UTI and pneumonia.  She later developed worsening confusions, MRI brain revealed multiple acute strokes.   Prolonged hospital admission complicated by several issues including hemorrhagic shock requiring transfer to ICU for pressors and GI consultation. EGD showed duodenal ulcers that were treated with clipped but later required embolization with IR.  Pt also found with RLE DVT status post IVC filter placement given bleeding anticoagulation contraindicated.  Acute metabolic encephalopathy:  --mental status back to baseline.  Completed steroid course.    Acute CVA:  with right hemiplegia.  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 Off of Plavix because of recent acute GI bleeding. --cont ASA 81 and statin.   Possible seizure:  on evening of 11/10/23.  Continue on keppra as per neuro    Acute GI bleeding 2/2 bleeding duodenal ulcer.  S/p EGD on 10/28/2023 with clip placed.   S/p IR embolization on 10/30/2023.  Continue on PPI BID   Right peroneal DVT:  initially refused ultrasound of the left lower extremity because of severe pain.  Ultrasound of the right lower extremity showed right calf DVT. Due to high risk of recurrent GI bleeding given recent massive GI bleed requiring embolization by IR, unable to treat with anticoagulation. Vascular surgery placed IVC filter on 11/09/23   Hemorrhagic shock: Resolved   Hypotension: resolved  --hold home Lisinopril-hydrochlorothiazide pending outpatient followup.   Acute blood loss anemia:  S/p 3 units of pRBCs and 2 units of platelet transfusions. --Hgb stable around 9-10 prior to discharge.   Pneumonia:  initially completed 5 days of IV ceftriaxone and azithromycin on 10/07/2023. Restarted on broad-spectrum antibiotics (vancomycin, ceftriaxone and Flagyl) on 10/28/2023 when she decompensated requiring transfer to the ICU. Completed abx course x 2.    Acute UTI:  urine cx showed strep agalactiae.  Completed antibiotics.   Acute on chronic diastolic CHF:  continue on lasix.   COPD exacerbation:  completed steroid course x2.  Pulmon signed off 11/25/23.   --cont bronchodilators as below.   Acute on chronic hypoxemic respiratory failure & chronic hypercapnic respiratory failure:  was intubated on 10/28/2023 and extubated on 11/01/2023. On home 2L O2 prior to discharge.  DM2:  --A1c 8.4, poorly controlled.  --started on glargine 18u daily and mealtime insulin 4u TID. --home glipizide and Tradjenta to be resumed after discharge.   Anxiety/panic attacks: severity unknown. Continue on home dose of sertraline     Non-small cell carcinoma  -- pt found to have 1.3 cm right upper lung spiculated nodule: S/p bronchoscopy on 10/20/2023.  Pathology showed rare atypical cells, but unclear if malignancy.  Lung biopsy repeated 1/10, showed non-small cell carcinoma. --outpatient f/u with Dr. Alena Bills.   Hypothyroidism:  Negative anti-TPO Ab (obtained at daughter's request as she has hashimoto's). --Synthroid increased from 100 mcg to 150 mcg daily.   Physical debility :  PT/OT recs SNF.  Pt's insurance denied SNF.  Family appealed, and won the appeal.  Neuropathy --pt complained of numbness and pain in her legs.  Home Lyrica to be resumed after discharge.    Discharge Diagnoses:  Principal Problem:   Acute metabolic encephalopathy Active Problems:   CVA (cerebral vascular accident) (HCC)   Uncontrolled type 2 diabetes mellitus with hypoglycemia, without long-term current use of insulin (HCC)   Hypotension   Acute on chronic respiratory failure with hypoxia (HCC)   Hypernatremia   COPD exacerbation (HCC)   Lung nodule   Pneumonia   UTI (urinary tract infection)   (HFpEF) heart failure with preserved ejection fraction (HCC)   Essential hypertension   Type 2 diabetes mellitus with obesity (HCC)   Melena   Gastrointestinal hemorrhage with melena   Duodenal ulcer   Acute deep vein thrombosis (DVT) of right lower extremity (HCC)   Bilateral leg pain   30 Day Unplanned Readmission Risk Score    Flowsheet Row ED to Hosp-Admission (Current) from 10/02/2023 in [facility] 1C MEDICAL TELEMETRY  30 Day Unplanned Readmission Risk Score (%) 29.08 Filed at 12/02/2023 1200       This score is the patient's risk of an unplanned readmission within 30 days of being discharged (0 -100%). The score is based on dignosis, age, lab data, medications, orders, and past utilization.   Low:  0-14.9   Medium: 15-21.9   High: 22-29.9   Extreme: 30 and above         Discharge  Instructions:  Allergies as of 12/02/2023       Reactions   Penicillins Hives   Aspirin Other (See Comments)   Lips numb   Sulfa Antibiotics         Medication List     PAUSE taking these medications    lisinopril-hydrochlorothiazide 20-25 MG tablet Wait to take this until your doctor or other care provider tells you to start again. Commonly known as: ZESTORETIC Take 1 tablet by mouth daily.       STOP taking these medications    predniSONE 10 MG (21) Tbpk tablet Commonly known as: STERAPRED UNI-PAK 21 TAB       TAKE these medications    aspirin EC 81 MG tablet Take 81 mg by mouth daily.   atorvastatin 20 MG tablet Commonly known as: LIPITOR Take 20 mg by mouth daily.   cetirizine 10 MG tablet Commonly known as: ZYRTEC Take 10 mg by mouth daily.   Combivent Respimat 20-100 MCG/ACT Aers respimat Generic drug: Ipratropium-Albuterol Inhale into the lungs every 6 (six) hours as needed for wheezing or shortness of breath.   dextromethorphan-guaiFENesin 30-600 MG 12hr tablet Commonly known as: MUCINEX DM Take 1 tablet by mouth 2 (two) times daily as needed for cough.   diclofenac  sodium 1 % Gel Commonly known as: VOLTAREN Apply topically 4 (four) times daily.   docusate sodium 100 MG capsule Commonly known as: COLACE Take 1 capsule (100 mg total) by mouth 2 (two) times daily.   Dulera 200-5 MCG/ACT Aero Generic drug: mometasone-formoterol Inhale 2 puffs into the lungs every 12 (twelve) hours.   furosemide 20 MG tablet Commonly known as: LASIX Take 1 tablet (20 mg total) by mouth daily.   glipiZIDE 10 MG tablet Commonly known as: GLUCOTROL Take 10 mg by mouth daily before breakfast.   insulin aspart 100 UNIT/ML injection Commonly known as: novoLOG Inject 4 Units into the skin 3 (three) times daily with meals.   insulin glargine-yfgn 100 UNIT/ML injection Commonly known as: SEMGLEE Inject 0.18 mLs (18 Units total) into the skin daily. Start  taking on: December 03, 2023   ipratropium 17 MCG/ACT inhaler Commonly known as: ATROVENT HFA Inhale 2 puffs into the lungs every 6 (six) hours.   levETIRAcetam 500 MG tablet Commonly known as: KEPPRA Take 1 tablet (500 mg total) by mouth 2 (two) times daily.   levothyroxine 150 MCG tablet Commonly known as: SYNTHROID Take 1 tablet (150 mcg total) by mouth daily before breakfast. Increased from 100. What changed:  medication strength how much to take additional instructions   multivitamin capsule Take 1 capsule by mouth daily.   pantoprazole 40 MG tablet Commonly known as: PROTONIX Take 1 tablet (40 mg total) by mouth 2 (two) times daily before a meal.   polyethylene glycol 17 g packet Commonly known as: MIRALAX / GLYCOLAX Take 17 g by mouth daily. Start taking on: December 03, 2023   pregabalin 50 MG capsule Commonly known as: LYRICA Take 50 mg by mouth 2 (two) times daily.   rOPINIRole 4 MG 24 hr tablet Commonly known as: REQUIP XL Take 1 tablet (4 mg total) by mouth at bedtime.   sertraline 100 MG tablet Commonly known as: ZOLOFT Take 100 mg by mouth daily.   Tradjenta 5 MG Tabs tablet Generic drug: linagliptin Take 5 mg by mouth daily.               Durable Medical Equipment  (From admission, onward)           Start     Ordered   10/03/23 1150  For home use only DME Bedside commode  Once       Question:  Patient needs a bedside commode to treat with the following condition  Answer:  Generalized weakness   10/03/23 1149   10/03/23 1149  For home use only DME Walker rolling  Once       Question Answer Comment  Walker: With 5 Inch Wheels   Patient needs a walker to treat with the following condition Generalized weakness      10/03/23 1149             Contact information for follow-up providers     Vida Rigger, MD Follow up.   Specialty: Pulmonary Disease Why: Office will call you Contact information: [address] [place] [address] [phone]         [facility] Follow up.   Specialty: General Practice Why: Hospital follow up Contact information: [address] [place] [address] [phone]              Contact information for after-discharge care     Destination     [facility] Preferred SNF .  Service: Skilled Nursing Contact information: [address] [place] [address] [phone]                     Allergies  Allergen Reactions   Penicillins Hives   Aspirin Other (See Comments)    Lips numb   Sulfa Antibiotics      The results of significant diagnostics from this hospitalization (including imaging, microbiology, ancillary and laboratory) are listed below for reference.   Consultations:   Procedures/Studies: DG Humerus Right Result Date: 11/29/2023 CLINICAL DATA:  Right arm pain. EXAM: RIGHT HUMERUS - 2+ VIEW COMPARISON:  None Available. FINDINGS: Two views are submitted which are not orthogonal. The mineralization and alignment are normal. There is no evidence of acute fracture or dislocation. Mild degenerative changes at the acromioclavicular joint. The glenohumeral and elbow joint spaces appear preserved. There are small punctate soft tissue calcifications distally in the upper arm without evidence of foreign body. IMPRESSION: No evidence of acute fracture or dislocation. Mild acromioclavicular degenerative changes. Electronically Signed   By: Carey Bullocks M.D.   On: 11/29/2023 15:22   DG Chest Port 1 View Result Date: 11/12/2023 CLINICAL DATA:  Status post bronchoscopy. EXAM: PORTABLE CHEST 1 VIEW COMPARISON:  Chest radiograph dated 10/29/2023 and CT dated 11/11/2023. FINDINGS: No focal canal/that faint right perihilar and right upper lobe hazy and streaky densities. No consolidative changes. There is no pleural effusion or pneumothorax. The cardiac silhouette is within  normal limits. No acute osseous pathology. IMPRESSION: 1. No pneumothorax. 2. Faint right perihilar and right upper lobe hazy and streaky densities. Electronically Signed   By: Elgie Collard M.D.   On: 11/12/2023 16:43   DG C-Arm 1-60 Min-No Report Result Date: 11/12/2023 Fluoroscopy was utilized by the requesting physician.  No radiographic interpretation.   DG C-Arm 1-60 Min-No Report Result Date: 11/12/2023 Fluoroscopy was utilized by the requesting physician.  No radiographic interpretation.   CT SUPER D CHEST WO MONARCH PILOT Result Date: 11/11/2023 CLINICAL DATA:  COPD with respiratory failure, diabetes and altered mental status. Right upper lobe pulmonary nodule biopsy 10/29/2023, demonstrating rare atypical cells. * Tracking Code: BO * EXAM: CT CHEST WITHOUT CONTRAST TECHNIQUE: Multidetector CT imaging of the chest was performed using thin slice collimation for electromagnetic bronchoscopy planning purposes, without intravenous contrast. RADIATION DOSE REDUCTION: This exam was performed according to the departmental dose-optimization program which includes automated exposure control, adjustment of the mA and/or kV according to patient size and/or use of iterative reconstruction technique. COMPARISON:  Prior CTs dated 10/28/2023, 10/19/2023, 09/24/2023 and 06/07/2023. FINDINGS: Cardiovascular: Atherosclerosis of the aorta, great vessels and coronary arteries. The heart size is normal. There is no pericardial effusion. Mediastinum/Nodes: There are no enlarged mediastinal, hilar or axillary lymph nodes.Hilar assessment is limited by the lack of intravenous contrast, although the hilar contours appear unchanged. Interval extubation and removal of the enteric tube. The thyroid gland, trachea and esophagus demonstrate no significant findings. Lungs/Pleura: Trace right pleural effusion. No pneumothorax. Pulmonary assessment is mildly limited by breathing artifact. There are mildly increased dependent  opacities at both lung bases, likely reflecting atelectasis. Persistent spiculated nodule inferiorly in the right upper lobe, measuring 1.7 x 1.6 cm on image 59/4. This is not significantly changed from recent prior imaging, although has enlarged from August. Smaller nodule superiorly in the right upper lobe measuring 0.9 x 0.7 cm on image 37/4 is stable. There are other smaller nodules, including a 0.9 cm ground-glass nodule in the right middle lobe on  image 71/4. Unchanged tubular structure medially in the right upper lobe (image 62/4), likely a mucous impacted bronchus. No suspicious left lung nodules. Upper abdomen: No acute findings are seen in the visualized upper abdomen. There are scattered calcified splenic granulomas. Small gallstones are partially imaged. Musculoskeletal/Chest wall: There is no chest wall mass or suspicious osseous finding. IMPRESSION: 1. Imaging for bronchoscopy planning and guidance. 2. Persistent dominant spiculated nodule inferiorly in the right upper lobe, not significantly changed from recent prior imaging, but enlarged from August, remaining morphologically suspicious for bronchogenic carcinoma. 3. Other smaller right lung nodules are stable. Continued attention on follow-up recommended. 4. No evidence of metastatic disease. 5. Interval extubation and removal of the enteric tube. Probable bibasilar atelectasis. 6.  Aortic Atherosclerosis (ICD10-I70.0). Electronically Signed   By: Carey Bullocks M.D.   On: 11/11/2023 16:31   EEG adult Result Date: 11/11/2023 Michele Quest, MD     11/11/2023 12:19 PM Patient Name: Michele House MRN: [id number] Epilepsy Attending: Charlsie House Referring Physician/Provider: Caryl Pina, MD Date: 11/11/2023 Duration: 36.59 mins Patient history: 68 year old F with seizure like activity getting eeg to evaluate for seizure Level of alertness: Awake, asleep AEDs during EEG study: LEV Technical aspects: This EEG study was done with scalp  electrodes positioned according to the 10-20 International system of electrode placement. Electrical activity was reviewed with band pass filter of 1-70Hz , sensitivity of 7 uV/mm, display speed of 54mm/sec with a 60Hz  notched filter applied as appropriate. EEG data were recorded continuously and digitally stored.  Video monitoring was available and reviewed as appropriate. Description: No clear posterior dominant rhythm was seen. Sleep was characterized by vertex waves, sleep spindles (12 to 14 Hz), maximal frontocentral region. EEG showed continuous generalized predominantly 5 to 7 Hz theta slowing. Hyperventilation and photic stimulation were not performed.    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 No seizures or epileptiform discharges were seen throughout the recording Michele House   CT HEAD WO CONTRAST ( ) Result Date: 11/10/2023 CLINICAL DATA:  Seizure disorder.  Clinical change. EXAM: CT HEAD WITHOUT CONTRAST TECHNIQUE: Contiguous axial images were obtained from the base of the skull through the vertex without intravenous contrast. RADIATION DOSE REDUCTION: This exam was performed according to the departmental dose-optimization program which includes automated exposure control, adjustment of the mA and/or kV according to patient size and/or use of iterative reconstruction technique. COMPARISON:  CT angio head and neck 10/13/2023. MR head without contrast 10/13/2023. FINDINGS: Brain: Expected evolution of posterior watershed infarcts bilaterally is noted. No new or expanding infarcts are present. No acute hemorrhage or mass lesion present. Generalized atrophy and white matter disease is otherwise stable. Deep brain nuclei are within normal limits. The ventricles are of normal size. 10 mm lesion within the right lateral ventricle is stable. No significant extraaxial fluid collection is present. The brainstem and cerebellum are  within normal limits. Midline structures are within normal limits. Vascular: Minimal calcifications are present within the cavernous internal carotid arteries bilaterally. No hyperdense vessel is present. Skull: Insert normal skull No significant extracranial soft tissue lesion is present. Sinuses/Orbits: Secretions are present in the sphenoid sinuses bilaterally. The paranasal sinuses and mastoid air cells are otherwise clear. The globes and orbits are within normal limits. IMPRESSION: 1. Expected evolution of posterior watershed infarcts bilaterally. 2. No new or expanding infarcts. 3. Stable 10 mm lesion within the right lateral ventricle. 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 4. Stable generalized  atrophy and white matter disease. This likely reflects the sequela of chronic microvascular ischemia. 5. Secretions in the sphenoid sinuses bilaterally. This may reflect acute sinusitis. Electronically Signed   By: Marin Roberts M.D.   On: 11/10/2023 20:11   US Venous Img Lower Unilateral Left (DVT) Result Date: 11/10/2023 CLINICAL DATA:  68 year old female with right calf DVT by ultrasound yesterday. Left lower extremity not imaged at that time. EXAM: **CHOOSE** 2 LOWER EXTREMITY VENOUS DOPPLER ULTRASOUND TECHNIQUE: Gray-scale sonography with compression, as well as color and duplex ultrasound, were performed to evaluate the deep venous system(s) from the level of the common femoral vein through the popliteal and proximal calf veins. COMPARISON:  Lower extremity Doppler ultrasound yesterday. FINDINGS: VENOUS Normal compressibility of the common femoral, superficial femoral, and popliteal veins, as well as the visualized calf veins. Visualized portions of profunda femoral vein and great saphenous vein unremarkable. No filling defects to suggest DVT on grayscale or color Doppler imaging. Doppler waveforms show normal direction of venous flow, normal respiratory plasticity and response to augmentation. The  contralateral common femoral vein was Not re-imaged today. OTHER Oval nonvascular fluid collection in the left popliteal fossa is 31 x 10 x 26 mm (image 21). Limitations: none IMPRESSION: 1. No evidence of left lower extremity DVT. 2. Left popliteal fossa Baker's cyst. Electronically Signed   By: Odessa Fleming M.D.   On: 11/10/2023 11:20   PERIPHERAL VASCULAR CATHETERIZATION Result Date: 11/09/2023 See surgical note for result.  US Venous Img Lower Bilateral (DVT) Result Date: 11/09/2023 CLINICAL DATA:  Bilateral lower extremity pain EXAM: BILATERAL LOWER EXTREMITY VENOUS DOPPLER ULTRASOUND Only the right lower extremity was imaged. The patient refused imaging of the left lower extremity. TECHNIQUE: Gray-scale sonography with graded compression, as well as color Doppler and duplex ultrasound were performed to evaluate the lower extremity deep venous systems from the level of the common femoral vein and including the common femoral, femoral, profunda femoral, popliteal and calf veins including the posterior tibial, peroneal and gastrocnemius veins when visible. The superficial great saphenous vein was also interrogated. Spectral Doppler was utilized to evaluate flow at rest and with distal augmentation maneuvers in the common femoral, femoral and popliteal veins. COMPARISON:  None Available. FINDINGS: RIGHT LOWER EXTREMITY Common Femoral Vein: No evidence of thrombus. Normal compressibility, respiratory phasicity and response to augmentation. Saphenofemoral Junction: No evidence of thrombus. Normal compressibility and flow on color Doppler imaging. Profunda Femoral Vein: No evidence of thrombus. Normal compressibility and flow on color Doppler imaging. Femoral Vein: No evidence of thrombus. Normal compressibility, respiratory phasicity and response to augmentation. Popliteal Vein: No evidence of thrombus. Normal compressibility, respiratory phasicity and response to augmentation. Calf Veins: Posterior tibial veins the  are patent and compressible with color flow on color Doppler imaging. However, 1 of the posterior tibial veins is noncompressible and thrombosed. Superficial Great Saphenous Vein: No evidence of thrombus. Normal compressibility. Venous Reflux:  None. Other Findings:  None. LEFT LOWER EXTREMITY Patient refused imaging. IMPRESSION: 1. Positive for isolated calf DVT in the right lower extremity. Thrombus is present within 1 of the peroneal veins. 2. Patient declined imaging of the left lower extremity. Electronically Signed   By: Malachy Moan M.D.   On: 11/09/2023 13:11      Labs: BNP (last 3 results) Recent Labs    09/23/23 1543 10/02/23 0351 10/28/23 1450  BNP 154.8* 21.5 36.4   Basic Metabolic Panel: Recent Labs  Lab 11/26/23 0538  NA 141  K 3.5  CL 102  CO2 29  GLUCOSE 143*  BUN 21  CREATININE 0.65  CALCIUM 9.1   Liver Function Tests: No results for input(s): "AST", "ALT", "ALKPHOS", "BILITOT", "PROT", "ALBUMIN" in the last 168 hours. No results for input(s): "LIPASE", "AMYLASE" in the last 168 hours. No results for input(s): "AMMONIA" in the last 168 hours. CBC: Recent Labs  Lab 11/26/23 0538  WBC 4.6  HGB 9.9*  HCT 30.8*  MCV 89.3  PLT 254   Cardiac Enzymes: No results for input(s): "CKTOTAL", "CKMB", "CKMBINDEX", "TROPONINI" in the last 168 hours. BNP: Invalid input(s): "POCBNP" CBG: Recent Labs  Lab 12/01/23 1636 12/01/23 2032 12/02/23 0437 12/02/23 0843 12/02/23 1151  GLUCAP 165* 198* 148* 180* 169*   D-Dimer No results for input(s): "DDIMER" in the last 72 hours. Hgb A1c No results for input(s): "HGBA1C" in the last 72 hours. Lipid Profile No results for input(s): "CHOL", "HDL", "LDLCALC", "TRIG", "CHOLHDL", "LDLDIRECT" in the last 72 hours. Thyroid function studies No results for input(s): "TSH", "T4TOTAL", "T3FREE", "THYROIDAB" in the last 72 hours.  Invalid input(s): "FREET3" Anemia work up No results for input(s): "VITAMINB12",  "FOLATE", "FERRITIN", "TIBC", "IRON", "RETICCTPCT" in the last 72 hours. Urinalysis    Component Value Date/Time   COLORURINE YELLOW (A) 10/02/2023 0458   APPEARANCEUR TURBID (A) 10/02/2023 0458   APPEARANCEUR Cloudy 08/31/2014 1528   LABSPEC 1.019 10/02/2023 0458   LABSPEC 1.015 08/31/2014 1528   PHURINE 6.0 10/02/2023 0458   GLUCOSEU 50 (A) 10/02/2023 0458   GLUCOSEU Negative 08/31/2014 1528   HGBUR NEGATIVE 10/02/2023 0458   BILIRUBINUR NEGATIVE 10/02/2023 0458   BILIRUBINUR Negative 08/31/2014 1528   KETONESUR 5 (A) 10/02/2023 0458   PROTEINUR 30 (A) 10/02/2023 0458   NITRITE POSITIVE (A) 10/02/2023 0458   LEUKOCYTESUR SMALL (A) 10/02/2023 0458   LEUKOCYTESUR 3+ 08/31/2014 1528   Sepsis Labs Recent Labs  Lab 11/26/23 0538  WBC 4.6   Microbiology No results found for this or any previous visit (from the past 240 hours).   Total time spend on discharging this patient, including the last patient exam, discussing the hospital stay, instructions for ongoing care as it relates to all pertinent caregivers, as well as preparing the medical discharge records, prescriptions, and/or referrals as applicable, is 40 minutes.    Darlin Priestly, MD  [facility] 12/02/2023, 2:02 PM

## 2023-12-02 NOTE — Progress Notes (Shared)
NAME:  Michele House, MRN:  [id number], DOB:  1956/04/25, LOS: 61 ADMISSION DATE:  10/02/2023   History of Present Illness:  Case of a 68 year old female patient with a past medical history of COPD with chronic respiratory failure on 2 L nasal cannula, hypertension, type 2 diabetes, heart failure with preserved EF who has been here for a month.  Initially presented with altered mental status encephalopathy.  Had an extensive workup including brain MRI which was negative for any acute intracranial abnormality initially.  However unfortunately course complicated by acute CVA with repeat MRI showing multiple acute infarcts in the high bilateral posterior frontal and parietal lobes.  She recovered from that but course further complicated by waxing and waning mental status which prompted an LP to rule out encephalitis and has been unremarkable so far.  She was found to have right upper lobe lung nodule which was concerning for malignancy and underwent robotic assisted navigational bronchoscopy with biopsy showing lung cancer.     Significant Hospital Events: Including procedures, antibiotic start and stop dates in addition to other pertinent events   11/25/23- patient seen at bedside.  Shed doing better today, smiling during eval.  She remains on room air.  At this point she's been stable from pulmonary perspective and has medical oncology appt for lung cancer.  I can see her anytime if needed but her other issues such as acute CVA are still being managed with inpatient rehab scheduled.  PCCM will sign off and will schedule appt to see her in pulmonology clinic.   12/02/2023 - Patient seen at bedside.  She is stable from a respiratory stand point.  She is continuing with nebulizer usage and is currently on room air at 96% at rest without noted distress.   She continues to work with physical therapy and is deconditioned with difficulties ambulating and transferring.    She remains emotionally labile  secondary to her current diagnosis.  We will follow as needed.     Objective   Blood pressure 129/60, pulse 92, temperature 98.4 F (36.9 C), resp. rate 18, height 5' (1.524 m), weight 76.5 kg, last menstrual period 12/24/1998, SpO2 96%.        Intake/Output Summary (Last 24 hours) at 12/02/2023 1453 Last data filed at 12/02/2023 1447 Gross per 24 hour  Intake 240 ml  Output 500 ml  Net -260 ml     Filed Weights   11/29/23 0219 11/30/23 0301 12/01/23 0230  Weight: 67.9 kg 70.2 kg 76.5 kg    Examination: General: Awake, follows verbal communication HENT: Supple neck reactive pupils  Lungs: mild wheezing b/l Cardiovascular: Normal S1, Normal S2, RRR  Abdomen: Soft, non tender, non distended, +BS  Extremities: Warm and well perfused no edema.   Labs and imaging were reviewed.   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  This has been sent for second opinion to be reviewed.  I think its most likely primary lung cancer.  S/p lung biopsy- with NSCLS - reported to Dr  [facility] oncology   Best Practice (right click and "Reselect all SmartList Selections" daily)   Diet/type: NPO DVT prophylaxis SCD Pressure ulcer(s): N/A GI prophylaxis: PPI Lines: Central line Foley:  Yes, and it is still needed Code Status:  full code     Vida Rigger, M.D.  Pulmonary & Critical Care Medicine

## 2023-12-02 NOTE — Progress Notes (Signed)
Physical Therapy Treatment Patient Details Name: Michele House MRN: [id number] DOB: 11/10/1955 Today's Date: 12/02/2023   History of Present Illness Patient is a 68 year old with prolonged hospital stay, originally admitted with AMS. 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 She developed respiratory distress requiring mechanical ventilation. Self extubated 12/27 with re-intubation and extubation on 12/30. Pt also found with RLE DVT status post IVC filter placement given bleeding anticoagulation contraindicated. Seizure on 1/8 with transfer to 2nd floor.    PT Comments  Pt was long sitting in bed upon arrival. She is alert but continues to be very emotional. Immediately starts crying. Pt upset that her personal stuff was lost during transfer to new rm previous night. Pt is easily redirected and agreeable to session. Pt continues to be very inconsistent in presentation. She required more assistance for all mobility and transfers today. Pt was able to stand pivot to BSC>recliner>back to EOB. All stand pivot transfers were towards the L. Max of one to stand pivot however +2 to stand for hygiene care after successful BM on BSC. Author assisted with standing while Facilities manager. Author elected not to have pt stay in recliner due to her increased assistance for all activity today. RN recommended to not transfer pt OOB today and use bedpan for additional safety. Dc recs remain appropriate. Acute PT will continue to follow per current POC.    If plan is discharge home, recommend the following: A lot of help with bathing/dressing/bathroom;Assistance with cooking/housework;Assistance with feeding;Direct supervision/assist for medications management;Direct supervision/assist for financial management;Assist for transportation;Help with stairs or ramp for entrance;Supervision due to cognitive status;A lot of help with walking and/or transfers     Equipment  Recommendations  Other (comment) (Defer to next level of care)       Precautions / Restrictions Precautions Precautions: Fall Precaution Comments: R side weakness> L side Restrictions Weight Bearing Restrictions Per Provider Order: No     Mobility  Bed Mobility Overal bed mobility: Needs Assistance Bed Mobility: Supine to Sit, Sit to Supine Rolling: Mod assist, Max assist Supine to sit: Max assist Sit to supine: Max assist General bed mobility comments: pt required more assistance with all mobility, transfers, and gait versus previous date. pt very niconsistent in presentation throughout admission. Pt states," I am not doing as good today am I?"    Transfers Overall transfer level: Needs assistance Equipment used: None (gait belt) Transfers: Sit to/from Stand, Bed to chair/wheelchair/BSC Sit to Stand: Max assist Stand pivot transfers: Max assist  General transfer comment: pt required more assistance today versus previous date. she was able to stand pivot to [facility]. had successful BM prior to stand pivot to recliner. She required + 2 assistance to perform hygiene care (autho assisted pt with standing while RN tech assisted with wiping) pt unable to get comfortable in recliner. stood pivot back to EOB. Recommended RN tech to use bedpan until pt's transfer safety improves.    Ambulation/Gait  General Gait Details: currently unable    Balance Overall balance assessment: Needs assistance Sitting-balance support: Bilateral upper extremity supported, Feet supported Sitting balance-Leahy Scale: Poor Sitting balance - Comments: pt continues to require more assistance with sitting balance versus standing   Standing balance support: Bilateral upper extremity supported, During functional activity Standing balance-Leahy Scale: Poor      Cognition Arousal: Alert Behavior During Therapy: Lability Overall Cognitive Status: Impaired/Different from baseline Area of Impairment:  Safety/judgement, Awareness, Problem solving  Following Commands: Follows one step commands with increased time Safety/Judgement: Decreased awareness of safety, Decreased awareness of deficits Awareness: Intellectual Problem Solving: Slow processing, Decreased initiation, Difficulty sequencing, Requires verbal cues, Requires tactile cues General Comments: Pt is alert and very motivated to participate. Still lacks insight of deficits with opoor awareness overall. " I want to go home today." Author redirects pt and discussed pt's need to be more independent and safe prior to [place] home.        Exercises General Exercises - Lower Extremity Ankle Circles/Pumps: AROM, Strengthening, Left, 10 reps, Supine Quad Sets: AROM, Strengthening, Left, 10 reps, Supine Long Arc Quad: Both, AROM, 10 reps        Pertinent Vitals/Pain Pain Assessment Pain Assessment: 0-10 Pain Score: 6  Pain Location: neck and L forearm Pain Descriptors / Indicators: Grimacing, Guarding, Discomfort, Crying, Moaning Pain Intervention(s): Limited activity within patient's tolerance, Monitored during session, Premedicated before session, Repositioned     PT Goals (current goals can now be found in the care plan section) Acute Rehab PT Goals Patient Stated Goal: "Get better so I can get home." Progress towards PT goals: Progressing toward goals    Frequency    Min 1X/week       Co-evaluation     PT goals addressed during session: Mobility/safety with mobility;Balance;Strengthening/ROM        AM-PAC PT "6 Clicks" Mobility   Outcome Measure  Help needed turning from your back to your side while in a flat bed without using bedrails?: A Lot Help needed moving from lying on your back to sitting on the side of a flat bed without using bedrails?: A Lot Help needed moving to and from a bed to a chair (including a wheelchair)?: A Lot Help needed standing up from a chair using your arms (e.g., wheelchair or  bedside chair)?: A Lot Help needed to walk in hospital room?: Total Help needed climbing 3-5 steps with a railing? : Total 6 Click Score: 10    End of Session Equipment Utilized During Treatment: Gait belt Activity Tolerance: Patient limited by fatigue;Patient limited by pain (neck pain and L forearm pain) Patient left: in bed;with call bell/phone within reach;with bed alarm set Nurse Communication: Mobility status (Assisted pt with lunch order at conclusion of session) PT Visit Diagnosis: Unsteadiness on feet (R26.81);Other abnormalities of gait and mobility (R26.89);Muscle weakness (generalized) (M62.81);Difficulty in walking, not elsewhere classified (R26.2);Hemiplegia and hemiparesis Hemiplegia - Right/Left: Right Hemiplegia - dominant/non-dominant: Dominant Hemiplegia - caused by: Cerebral infarction     Time: 1610-9604 PT Time Calculation (min) (ACUTE ONLY): 37 min  Charges:    $Therapeutic Activity: 23-37 mins PT General Charges $$ ACUTE PT VISIT: 1 Visit                     Jetta Lout PTA 12/02/23, 12:43 PM

## 2023-12-16 ENCOUNTER — Inpatient Hospital Stay: Payer: 59 | Admitting: Internal Medicine

## 2023-12-17 ENCOUNTER — Telehealth: Payer: Self-pay | Admitting: *Deleted

## 2023-12-17 ENCOUNTER — Inpatient Hospital Stay: Payer: 59 | Attending: Internal Medicine | Admitting: Internal Medicine

## 2023-12-17 ENCOUNTER — Encounter: Payer: Self-pay | Admitting: *Deleted

## 2023-12-17 ENCOUNTER — Telehealth: Payer: Self-pay | Admitting: Internal Medicine

## 2023-12-17 ENCOUNTER — Inpatient Hospital Stay: Payer: 59

## 2023-12-17 ENCOUNTER — Encounter: Payer: Self-pay | Admitting: Internal Medicine

## 2023-12-17 VITALS — BP 138/60 | HR 80 | Temp 97.0°F | Resp 16

## 2023-12-17 DIAGNOSIS — Z794 Long term (current) use of insulin: Secondary | ICD-10-CM | POA: Insufficient documentation

## 2023-12-17 DIAGNOSIS — E785 Hyperlipidemia, unspecified: Secondary | ICD-10-CM | POA: Insufficient documentation

## 2023-12-17 DIAGNOSIS — Z79899 Other long term (current) drug therapy: Secondary | ICD-10-CM | POA: Insufficient documentation

## 2023-12-17 DIAGNOSIS — Z86718 Personal history of other venous thrombosis and embolism: Secondary | ICD-10-CM | POA: Diagnosis not present

## 2023-12-17 DIAGNOSIS — K219 Gastro-esophageal reflux disease without esophagitis: Secondary | ICD-10-CM | POA: Insufficient documentation

## 2023-12-17 DIAGNOSIS — Z9981 Dependence on supplemental oxygen: Secondary | ICD-10-CM | POA: Diagnosis not present

## 2023-12-17 DIAGNOSIS — Z87891 Personal history of nicotine dependence: Secondary | ICD-10-CM | POA: Diagnosis not present

## 2023-12-17 DIAGNOSIS — C3411 Malignant neoplasm of upper lobe, right bronchus or lung: Secondary | ICD-10-CM | POA: Insufficient documentation

## 2023-12-17 DIAGNOSIS — Z7982 Long term (current) use of aspirin: Secondary | ICD-10-CM | POA: Diagnosis not present

## 2023-12-17 DIAGNOSIS — Z791 Long term (current) use of non-steroidal anti-inflammatories (NSAID): Secondary | ICD-10-CM | POA: Diagnosis not present

## 2023-12-17 DIAGNOSIS — D649 Anemia, unspecified: Secondary | ICD-10-CM

## 2023-12-17 DIAGNOSIS — Z7984 Long term (current) use of oral hypoglycemic drugs: Secondary | ICD-10-CM | POA: Diagnosis not present

## 2023-12-17 DIAGNOSIS — Z9049 Acquired absence of other specified parts of digestive tract: Secondary | ICD-10-CM | POA: Diagnosis not present

## 2023-12-17 DIAGNOSIS — Z79890 Hormone replacement therapy (postmenopausal): Secondary | ICD-10-CM | POA: Diagnosis not present

## 2023-12-17 DIAGNOSIS — M79605 Pain in left leg: Secondary | ICD-10-CM | POA: Diagnosis not present

## 2023-12-17 DIAGNOSIS — Z7951 Long term (current) use of inhaled steroids: Secondary | ICD-10-CM | POA: Insufficient documentation

## 2023-12-17 DIAGNOSIS — G9340 Encephalopathy, unspecified: Secondary | ICD-10-CM | POA: Insufficient documentation

## 2023-12-17 DIAGNOSIS — Z803 Family history of malignant neoplasm of breast: Secondary | ICD-10-CM | POA: Diagnosis not present

## 2023-12-17 DIAGNOSIS — M79601 Pain in right arm: Secondary | ICD-10-CM | POA: Diagnosis not present

## 2023-12-17 DIAGNOSIS — E119 Type 2 diabetes mellitus without complications: Secondary | ICD-10-CM | POA: Insufficient documentation

## 2023-12-17 DIAGNOSIS — J440 Chronic obstructive pulmonary disease with acute lower respiratory infection: Secondary | ICD-10-CM | POA: Insufficient documentation

## 2023-12-17 DIAGNOSIS — I509 Heart failure, unspecified: Secondary | ICD-10-CM | POA: Diagnosis not present

## 2023-12-17 DIAGNOSIS — E079 Disorder of thyroid, unspecified: Secondary | ICD-10-CM | POA: Diagnosis not present

## 2023-12-17 DIAGNOSIS — I110 Hypertensive heart disease with heart failure: Secondary | ICD-10-CM | POA: Diagnosis not present

## 2023-12-17 LAB — COMPREHENSIVE METABOLIC PANEL
ALT: 12 U/L (ref 0–44)
AST: 17 U/L (ref 15–41)
Albumin: 3.4 g/dL — ABNORMAL LOW (ref 3.5–5.0)
Alkaline Phosphatase: 59 U/L (ref 38–126)
Anion gap: 10 (ref 5–15)
BUN: 10 mg/dL (ref 8–23)
CO2: 29 mmol/L (ref 22–32)
Calcium: 9 mg/dL (ref 8.9–10.3)
Chloride: 100 mmol/L (ref 98–111)
Creatinine, Ser: 0.62 mg/dL (ref 0.44–1.00)
GFR, Estimated: >60 mL/min (ref >60)
Glucose, Bld: 155 mg/dL — ABNORMAL HIGH (ref 70–99)
Potassium: 3.2 mmol/L — ABNORMAL LOW (ref 3.5–5.1)
Sodium: 139 mmol/L (ref 135–145)
Total Bilirubin: 0.4 mg/dL (ref 0.0–1.2)
Total Protein: 6.4 g/dL — ABNORMAL LOW (ref 6.5–8.1)

## 2023-12-17 LAB — CBC WITH DIFFERENTIAL/PLATELET
Abs Immature Granulocytes: 0.01 10*3/uL (ref 0.00–0.07)
Basophils Absolute: 0 10*3/uL (ref 0.0–0.1)
Basophils Relative: 0 %
Eosinophils Absolute: 0 10*3/uL (ref 0.0–0.5)
Eosinophils Relative: 0 %
HCT: 34 % — ABNORMAL LOW (ref 36.0–46.0)
Hemoglobin: 10.6 g/dL — ABNORMAL LOW (ref 12.0–15.0)
Immature Granulocytes: 0 %
Lymphocytes Relative: 26 %
Lymphs Abs: 0.8 10*3/uL (ref 0.7–4.0)
MCH: 26.7 pg (ref 26.0–34.0)
MCHC: 31.2 g/dL (ref 30.0–36.0)
MCV: 85.6 fL (ref 80.0–100.0)
Monocytes Absolute: 0.3 10*3/uL (ref 0.1–1.0)
Monocytes Relative: 11 %
Neutro Abs: 2 10*3/uL (ref 1.7–7.7)
Neutrophils Relative %: 63 %
Platelets: 197 10*3/uL (ref 150–400)
RBC: 3.97 MIL/uL (ref 3.87–5.11)
RDW: 14.1 % (ref 11.5–15.5)
WBC: 3.2 10*3/uL — ABNORMAL LOW (ref 4.0–10.5)
nRBC: 0 % (ref 0.0–0.2)

## 2023-12-17 LAB — IRON AND TIBC
Iron: 33 ug/dL (ref 28–170)
Saturation Ratios: 10 % — ABNORMAL LOW (ref 10.4–31.8)
TIBC: 322 ug/dL (ref 250–450)
UIBC: 289 ug/dL

## 2023-12-17 LAB — FERRITIN: Ferritin: 37 ng/mL (ref 11–307)

## 2023-12-17 NOTE — Progress Notes (Signed)
[facility] CONSULT NOTE  Patient Care Team: Center, [facility] as PCP - General (General Practice) Jim Like, RN as Registered Nurse Scarlett Presto, RN (Inactive) as Registered Nurse Michaelyn Barter, MD as Consulting Physician (Oncology) Glory Buff, RN as Oncology Nurse Navigator  Reason for visit- right upper lobe lung non-small cell cancer.  CANCER STAGING   Cancer Staging  No matching staging information was found for the patient.   ASSESSMENT & PLAN:  Michele House 68 y.o. 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  # RUL NSCLC - Status post EBUS with biopsy with Dr.Aleskerov.  Right upper lobe lung biopsy showed non-small cell carcinoma.  Coexpress is TTF-1 and p40.  Insufficient material for ancillary molecular testing. RUL bronchial lavage positive for malignancy. Station 7 lymph node negative for malignancy.  -Will obtain PET/CT scan for complete staging. -Referral to radiation oncology for potential SBRT if we are only dealing with that 1.6 cm mass. -Referral to social worker  # COPD on 2 L oxygen -Continue follow-up with pulmonary  # History of GI bleed -s/p EGD which showed bleeding duodenal ulcer with clip placed 10/28/2023.  - s/p IR embolization on 10/30/2023.  Plavix was discontinued.  She was continued on baby aspirin. -Check CBC, iron panel  # History of DVT - Had bilateral lower extremity Dopplers for leg pain.  Showed right calf DVT.   - Status post IVC filter on 11/09/2023.  RTC in 3 weeks for MD visit to discuss PET results. She is also requesting for referral to PCP at [facility].   Orders Placed This Encounter  Procedures   NM PET Image Initial (PI) Skull Base To Thigh    Standing Status:   Future    Expected Date:   12/24/2023    Expiration Date:   12/16/2024    If  indicated for the ordered procedure, I authorize the administration of a radiopharmaceutical per Radiology protocol:   Yes    Preferred imaging location?:   [facility]   CBC with Differential/Platelet    Standing Status:   Future    Number of Occurrences:   1    Expiration Date:   12/16/2024   Comprehensive metabolic panel    Standing Status:   Future    Number of Occurrences:   1    Expiration Date:   12/16/2024   Iron and TIBC    Standing Status:   Future    Number of Occurrences:   1    Expiration Date:   12/16/2024   Ferritin    Standing Status:   Future    Number of Occurrences:   1    Expiration Date:   12/16/2024   Ambulatory referral to Social Work    Referral Priority:   Routine    Referral Type:   Consultation    Referral Reason:   Specialty Services Required    Number of Visits Requested:   1   Ambulatory Referral to Primary Care    Referral Priority:   Urgent    Referral Type:   Consultation    Referral Reason:   Specialty Services Required    Referred to Provider:   Enid Baas, MD    Number of Visits Requested:   1   Ambulatory referral to Radiation Oncology    Referral Priority:  Routine    Referral Type:   Consultation    Referral Reason:   Specialty Services Required    Requested Specialty:   Radiation Oncology    Number of Visits Requested:   1    The total time spent in the appointment was 45 minutes encounter with patients including review of chart and various tests results, discussions about plan of care and coordination of care plan   All questions were answered. The patient knows to call the clinic with any problems, questions or concerns. No barriers to learning was detected.  Michaelyn Barter, MD 2/14/20251:17 PM   HISTORY OF PRESENTING ILLNESS:  Michele House 68 y.o. female with pmh of COPD, on 2 L oxygen, hypertension, type 2 diabetes, CHF referred to medical oncology for diagnosis of right upper lobe lung non-small cell cancer  favoring adenocarcinoma.  Patient had long complicated hospital course from 10/02/2023 to 11/15/2023.  Presented with COPD exacerbation, pneumonia, UTI and encephalopathy.  Requiring intubation.   CTA chest from 09/24/2023 showed increase in size of spiculated pulmonary nodule in the inferior right upper lobe 1.6 cm compared to 1.3 cm previously.  Initial lung biopsy on 10/20/2023 was nondiagnostic.    Repeat EBUS with biopsy with Dr.Aleskerov on 11/12/2023.  Right upper lobe lung biopsy showed non-small cell carcinoma.  Coexpress is TTF-1 and p40.  Insufficient material for ancillary molecular testing. RUL bronchial lavage positive for malignancy. Station 7 lymph node negative for malignancy.  Hospital course complicated by encephalopathy.  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  EEG showed diffuse slowing without seizures.  LP unremarkable.  Autoimmune/paraneoplastic panel sent.  Treated with 5 days of high-dose Solu-Medrol with not much improvement.  Also developed GI bleed causing hemorrhagic shock. s/p EGD which showed bleeding duodenal ulcer with clip placed 10/28/2023. s/p IR embolization on 10/30/2023.  Plavix was discontinued.  She was continued on baby aspirin.  Had bilateral lower extremity Dopplers for leg pain.  Showed right calf DVT.  Status post IVC filter on 11/09/2023.   I have reviewed her chart and materials related to her cancer extensively and collaborated history with the patient. Summary of oncologic history is as follows: Oncology History   No history exists.    MEDICAL HISTORY:  Past Medical History:  Diagnosis Date   Acid reflux    COPD (chronic obstructive pulmonary disease) (HCC)    Diabetes mellitus without complication (HCC)    Hyperlipidemia    Hypertension    Thyroid  disease     SURGICAL HISTORY: Past Surgical History:  Procedure Laterality Date   appendectomy     APPENDECTOMY     BREAST BIOPSY Right    CORE W/CLIP - NEG   ESOPHAGOGASTRODUODENOSCOPY N/A 10/28/2023   Procedure: ESOPHAGOGASTRODUODENOSCOPY (EGD);  Surgeon: Midge Minium, MD;  Location: [facility] ENDOSCOPY;  Service: Endoscopy;  Laterality: N/A;   FLEXIBLE BRONCHOSCOPY Bilateral 11/12/2023   Procedure: FLEXIBLE BRONCHOSCOPY;  Surgeon: Vida Rigger, MD;  Location: [facility] ORS;  Service: Thoracic;  Laterality: Bilateral;   IR EMBO ART  VEN HEMORR LYMPH EXTRAV  INC GUIDE ROADMAPPING  10/30/2023   IVC FILTER INSERTION N/A 11/09/2023   Procedure: IVC FILTER INSERTION;  Surgeon: Annice Needy, MD;  Location: [facility] INVASIVE CV LAB;  Service: Cardiovascular;  Laterality: N/A;   TOTAL VAGINAL HYSTERECTOMY     TUMOR REMOVAL  benign;stomach   VIDEO BRONCHOSCOPY WITH ENDOBRONCHIAL NAVIGATION Right 10/20/2023   Procedure: VIDEO BRONCHOSCOPY WITH ENDOBRONCHIAL NAVIGATION;  Surgeon: Vida Rigger, MD;  Location: [facility] ORS;  Service: Thoracic;  Laterality: Right;   VIDEO BRONCHOSCOPY WITH ENDOBRONCHIAL NAVIGATION Right 11/12/2023   Procedure: VIDEO BRONCHOSCOPY WITH ENDOBRONCHIAL NAVIGATION;  Surgeon: Vida Rigger, MD;  Location: [facility] ORS;  Service: Thoracic;  Laterality: Right;    SOCIAL HISTORY: Social History   Socioeconomic History   Marital status: Single    Spouse name: Not on file   Number of children: Not on file   Years of education: Not on file   Highest education level: Not on file  Occupational History   Not on file  Tobacco Use   Smoking status: Former    Current packs/day: 0.00    Types: Cigarettes    Quit date: 12/15/1985    Years since quitting: 38.0   Smokeless tobacco: Never  Substance and Sexual Activity   Alcohol use: No   Drug use: No   Sexual activity: Not Currently  Other Topics Concern   Not on file  Social History Narrative   Not on file   Social Drivers of  Health   Financial Resource Strain: Not on file  Food Insecurity: Patient Unable To Answer (10/03/2023)   Hunger Vital Sign    Worried About Running Out of Food in the Last Year: Patient unable to answer    Ran Out of Food in the Last Year: Patient unable to answer  Recent Concern: Food Insecurity - Food Insecurity Present (09/24/2023)   Hunger Vital Sign    Worried About Running Out of Food in the Last Year: Sometimes true    Ran Out of Food in the Last Year: Sometimes true  Transportation Needs: Patient Unable To Answer (10/03/2023)   PRAPARE - Transportation    Lack of Transportation (Medical): Patient unable to answer    Lack of Transportation (Non-Medical): Patient unable to answer  Recent Concern: Transportation Needs - Unmet Transportation Needs (09/24/2023)   PRAPARE - Administrator, Civil Service (Medical): Yes    Lack of Transportation (Non-Medical): Yes  Physical Activity: Not on file  Stress: Not on file  Social Connections: Patient Declined (11/02/2023)   Social Connection and Isolation Panel [NHANES]    Frequency of Communication with Friends and Family: Patient declined    Frequency of Social Gatherings with Friends and Family: Patient declined    Attends Religious Services: Patient declined    Database administrator or Organizations: Patient declined    Attends Banker Meetings: Patient declined    Marital Status: Patient declined  Intimate Partner Violence: Patient Unable To Answer (10/03/2023)   Humiliation, Afraid, Rape, and Kick questionnaire    Fear of Current or Ex-Partner: Patient unable to answer    Emotionally Abused: Patient unable to answer    Physically Abused: Patient unable to answer    Sexually Abused: Patient unable to answer    FAMILY HISTORY: Family History  Problem Relation Age of Onset   Heart attack Mother    Hypertension Mother    Breast cancer Sister 67   Breast cancer Maternal Aunt        40'S   Breast cancer  Maternal Grandmother     ALLERGIES:  is allergic to penicillins, aspirin, and sulfa antibiotics.  MEDICATIONS:  Current Outpatient Medications  Medication Sig Dispense Refill   aspirin EC 81 MG tablet Take 81 mg by mouth daily.  atorvastatin (LIPITOR) 20 MG tablet Take 20 mg by mouth daily.     calcium carbonate (TUMS - DOSED IN MG ELEMENTAL CALCIUM) 500 MG chewable tablet Chew 1 tablet by mouth daily.     cetirizine (ZYRTEC) 10 MG tablet Take 10 mg by mouth daily.     COMBIVENT RESPIMAT 20-100 MCG/ACT AERS respimat Inhale into the lungs every 6 (six) hours as needed for wheezing or shortness of breath.     dextromethorphan-guaiFENesin (MUCINEX DM) 30-600 MG 12hr tablet Take 1 tablet by mouth 2 (two) times daily as needed for cough. 30 tablet 0   diclofenac sodium (VOLTAREN) 1 % GEL Apply topically 4 (four) times daily.     docusate sodium (COLACE) 100 MG capsule Take 1 capsule (100 mg total) by mouth 2 (two) times daily.     DULERA 200-5 MCG/ACT AERO Inhale 2 puffs into the lungs every 12 (twelve) hours.     furosemide (LASIX) 20 MG tablet Take 1 tablet (20 mg total) by mouth daily. 30 tablet 0   glipiZIDE (GLUCOTROL) 10 MG tablet Take 10 mg by mouth daily before breakfast.     insulin aspart (NOVOLOG) 100 UNIT/ML injection Inject 4 Units into the skin 3 (three) times daily with meals.     insulin glargine-yfgn (SEMGLEE) 100 UNIT/ML injection Inject 0.18 mLs (18 Units total) into the skin daily.     ipratropium (ATROVENT HFA) 17 MCG/ACT inhaler Inhale 2 puffs into the lungs every 6 (six) hours.     levETIRAcetam (KEPPRA) 500 MG tablet Take 1 tablet (500 mg total) by mouth 2 (two) times daily.     levothyroxine (SYNTHROID) 150 MCG tablet Take 1 tablet (150 mcg total) by mouth daily before breakfast. Increased from 100.     [Paused] lisinopril-hydrochlorothiazide (PRINZIDE,ZESTORETIC) 20-25 MG per tablet Take 1 tablet by mouth daily.     loperamide (IMODIUM A-D) 2 MG tablet Take 2 mg by  mouth 4 (four) times daily as needed for diarrhea or loose stools.     Multiple Vitamin (MULTIVITAMIN) capsule Take 1 capsule by mouth daily.     naloxone (NARCAN) nasal spray 4 mg/0.1 mL Place 1 spray into the nose.     ondansetron (ZOFRAN) 4 MG/5ML solution Take by mouth once.     pantoprazole (PROTONIX) 40 MG tablet Take 1 tablet (40 mg total) by mouth 2 (two) times daily before a meal.     polyethylene glycol (MIRALAX / GLYCOLAX) 17 g packet Take 17 g by mouth daily.     pregabalin (LYRICA) 50 MG capsule Take 50 mg by mouth 2 (two) times daily.     rOPINIRole (REQUIP XL) 4 MG 24 hr tablet Take 1 tablet (4 mg total) by mouth at bedtime.     sertraline (ZOLOFT) 100 MG tablet Take 100 mg by mouth daily.     TRADJENTA 5 MG TABS tablet Take 5 mg by mouth daily.     No current facility-administered medications for this visit.    REVIEW OF SYSTEMS:   Pertinent information mentioned in HPI All other systems were reviewed with the patient and are negative.  PHYSICAL EXAMINATION: ECOG PERFORMANCE STATUS: 3 - Symptomatic, >50% confined to bed  Vitals:   12/17/23 1025  BP: 138/60  Pulse: 80  Resp: 16  Temp: (!) 97 F (36.1 C)  SpO2: 98%   There were no vitals filed for this visit.  GENERAL:alert, no distress and comfortable SKIN: skin color, texture, turgor are normal, no rashes or significant lesions EYES:  normal, conjunctiva are pink and non-injected, sclera clear OROPHARYNX:no exudate, no erythema and lips, buccal mucosa, and tongue normal  NECK: supple, thyroid normal size, non-tender, without nodularity LYMPH:  no palpable lymphadenopathy in the cervical, axillary or inguinal LUNGS: clear to auscultation and percussion with normal breathing effort HEART: regular rate & rhythm and no murmurs and no lower extremity edema ABDOMEN:abdomen soft, non-tender and normal bowel sounds Musculoskeletal:no cyanosis of digits and no clubbing  PSYCH: alert & oriented x 3 with fluent  speech NEURO: no focal motor/sensory deficits  LABORATORY DATA:  I have reviewed the data as listed Lab Results  Component Value Date   WBC 3.2 (L) 12/17/2023   HGB 10.6 (L) 12/17/2023   HCT 34.0 (L) 12/17/2023   MCV 85.6 12/17/2023   PLT 197 12/17/2023   Recent Labs    10/24/23 0614 10/25/23 0415 10/28/23 1450 10/28/23 2006 11/20/23 1304 11/26/23 0538 12/17/23 1109  NA 132*   < > 129*   < > 140 141 139  K 4.1   < > 5.4*   < > 3.5 3.5 3.2*  CL 87*   < > 83*   < > 99 102 100  CO2 34*   < > 35*   < > 29 29 29   GLUCOSE 235*   < > 536*   < > 181* 143* 155*  BUN 23   < > 84*   < > 17 21 10   CREATININE 0.77   < > 1.23*   < > 0.86 0.65 0.62  CALCIUM 8.9   < > 8.9   < > 9.1 9.1 9.0  GFRNONAA >60   < > 48*   < > >60 >60 >60  PROT 6.0*  --  5.5*  --   --   --  6.4*  ALBUMIN 2.8*  --  2.7*  --   --   --  3.4*  AST 22  --  30  --   --   --  17  ALT 20  --  27  --   --   --  12  ALKPHOS 72  --  71  --   --   --  59  BILITOT 0.7  --  0.6  --   --   --  0.4   < > = values in this interval not displayed.    RADIOGRAPHIC STUDIES: I have personally reviewed the radiological images as listed and agreed with the findings in the report. DG Humerus Right Result Date: 11/29/2023 CLINICAL DATA:  Right arm pain. EXAM: RIGHT HUMERUS - 2+ VIEW COMPARISON:  None Available. FINDINGS: Two views are submitted which are not orthogonal. The mineralization and alignment are normal. There is no evidence of acute fracture or dislocation. Mild degenerative changes at the acromioclavicular joint. The glenohumeral and elbow joint spaces appear preserved. There are small punctate soft tissue calcifications distally in the upper arm without evidence of foreign body. IMPRESSION: No evidence of acute fracture or dislocation. Mild acromioclavicular degenerative changes. Electronically Signed   By: Carey Bullocks M.D.   On: 11/29/2023 15:22

## 2023-12-17 NOTE — Telephone Encounter (Signed)
PET scan is now scheduled. I tried to contact [facility] but the phone just kept ringing. I called pt son and left a vm about the details of the PET scan so that someone is aware of the appt. I also said if he had any questions he could call us back and phone number was provided.

## 2023-12-17 NOTE — Telephone Encounter (Signed)
I called the son and he said that is good for him to know that it is on 2/19. The son says that she has to have transportation and [facility] needs that info. I called the nursing home and spoke to [place] and she said to fax the papers to her at [phone] and the fax transmission went through. I also called the son back to tell him that the social worker  should  talk to the son to see if they can help to get into another facility and he has spoke to her but when he goes back to see mother he will speak to her again

## 2023-12-17 NOTE — Progress Notes (Signed)
Patient is currently in [facility], and she hates it and it upsets her talking about it, they push on her, they don't help her when she needs it. I told her that I would look into what we can do to maybe get her somewhere else. 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 She is on 2 liters of O2. She gets dizzy, her vision is blurry a little bit. Her middle finger on her right hand hurts her and she feels like neuropathy in her legs and feet.

## 2023-12-17 NOTE — Telephone Encounter (Signed)
Spoke with Cordelia Pen, RN (triage) pt son had called her and wanted [facility] to be aware of the appts. I printed off the AVS and Cordelia Pen stated she will fax the appts to [facility].

## 2023-12-21 ENCOUNTER — Inpatient Hospital Stay: Payer: 59

## 2023-12-21 NOTE — Progress Notes (Signed)
[facility] Clinical Social Work  Initial Assessment   HALLEE MCKENNY is a 68 y.o. year old female contacted caregiver by phone. Clinical Social Work was referred by medical provider for assessment of psychosocial needs.   SDOH (Social Determinants of Health) assessments performed: Yes   SDOH Screenings   Food Insecurity: Patient Unable To Answer (10/03/2023)  Recent Concern: Food Insecurity - Food Insecurity Present (09/24/2023)  Housing: High Risk (10/03/2023)  Transportation Needs: Patient Unable To Answer (10/03/2023)  Recent Concern: Transportation Needs - Unmet Transportation Needs (09/24/2023)  Utilities: Patient Unable To Answer (10/03/2023)  Social Connections: Patient Declined (11/02/2023)  Tobacco Use: Medium Risk (12/17/2023)     Distress Screen completed: No     No data to display            Family/Social Information:  Housing Arrangement: Patient currently resides at [facility]. Family members/support persons in your life? Spoke with patient's son, Onalee Hua.  She also has a daughter and two sisters. Transportation concerns: no  Employment: Legally disabled  Income source: Secretary/administrator concerns: No Type of concern: None Food access concerns: no Religious or spiritual practice:  Patient identifies at [employer]. Advanced directives: No Services Currently in place:  Medicare  Coping/ Adjustment to diagnosis: Patient understands treatment plan and what happens next? yes Concerns about diagnosis and/or treatment: How will I care for myself Patient reported stressors: Housing Hopes and/or priorities: Family Patient enjoys time with family/ friends Current coping skills/ strengths: Supportive family/friends     SUMMARY: Current SDOH Barriers:  Level of care concerns  Clinical Social Work Clinical Goal(s):  Explore community resource options for unmet needs related to:  Housing   Interventions: Discussed common feeling and emotions  when being diagnosed with cancer, and the importance of support during treatment Informed patient of the support team roles and support services at [facility] Provided CSW contact information and encouraged patient to call with any questions or concerns Provided patient with information about CSW role.  Son, Onalee Hua, would like to move patient to another facility after she is through with rehab.  Plan is for CSW to contact son on Monday, 2/24 at 9am to further discuss.   Follow Up Plan: CSW will follow-up with patient by phone  Patient verbalizes understanding of plan: Yes    Dorothey Baseman, LCSW Clinical Social Worker [facility]

## 2023-12-22 ENCOUNTER — Ambulatory Visit: Payer: 59

## 2023-12-24 ENCOUNTER — Ambulatory Visit
Admission: RE | Admit: 2023-12-24 | Discharge: 2023-12-24 | Disposition: A | Discharge status: Home / Self Care | Payer: 59 | Source: Ambulatory Visit | Attending: Internal Medicine | Admitting: Internal Medicine

## 2023-12-24 DIAGNOSIS — I700 Atherosclerosis of aorta: Secondary | ICD-10-CM | POA: Insufficient documentation

## 2023-12-24 DIAGNOSIS — D649 Anemia, unspecified: Secondary | ICD-10-CM | POA: Diagnosis not present

## 2023-12-24 DIAGNOSIS — I6523 Occlusion and stenosis of bilateral carotid arteries: Secondary | ICD-10-CM | POA: Diagnosis not present

## 2023-12-24 DIAGNOSIS — J439 Emphysema, unspecified: Secondary | ICD-10-CM | POA: Diagnosis not present

## 2023-12-24 DIAGNOSIS — C3411 Malignant neoplasm of upper lobe, right bronchus or lung: Secondary | ICD-10-CM | POA: Insufficient documentation

## 2023-12-24 DIAGNOSIS — R911 Solitary pulmonary nodule: Secondary | ICD-10-CM | POA: Insufficient documentation

## 2023-12-24 LAB — GLUCOSE, CAPILLARY: Glucose-Capillary: 109 mg/dL — ABNORMAL HIGH (ref 70–99)

## 2023-12-24 MED ORDER — FLUDEOXYGLUCOSE F - 18 (FDG) INJECTION
8.7000 | Freq: Once | INTRAVENOUS | Status: AC | PRN
  Administered 2023-12-24: 9.27 via INTRAVENOUS

## 2023-12-27 ENCOUNTER — Telehealth: Payer: Self-pay

## 2023-12-27 ENCOUNTER — Inpatient Hospital Stay: Payer: 59

## 2023-12-27 NOTE — Progress Notes (Signed)
 [facility] CSW Progress Note  Clinical Social Worker contacted caregiver by phone to discuss patient care.  Patient's son, Michele House, said patient rehabilitation at [facility] is supposed to conclude on 01/01/24.  He stated his sister is working with the Child psychotherapist at the facility regarding care.  Encouraged family to work with the Child psychotherapist regarding moving patient to another facility if that is their goal.      Dorothey Baseman, LCSW Clinical Social Worker [facility]

## 2023-12-27 NOTE — Telephone Encounter (Signed)
 Clinical Social Work attempted to contact patient's son, Onalee Hua, by phone.  Left voicemail with contact information and request for return call.

## 2023-12-28 ENCOUNTER — Ambulatory Visit: Payer: 59 | Attending: Radiation Oncology | Admitting: Radiation Oncology

## 2024-01-05 ENCOUNTER — Inpatient Hospital Stay: Attending: Internal Medicine

## 2024-01-05 DIAGNOSIS — I700 Atherosclerosis of aorta: Secondary | ICD-10-CM | POA: Insufficient documentation

## 2024-01-05 DIAGNOSIS — I6523 Occlusion and stenosis of bilateral carotid arteries: Secondary | ICD-10-CM | POA: Insufficient documentation

## 2024-01-05 DIAGNOSIS — Z79899 Other long term (current) drug therapy: Secondary | ICD-10-CM | POA: Insufficient documentation

## 2024-01-05 DIAGNOSIS — Z803 Family history of malignant neoplasm of breast: Secondary | ICD-10-CM | POA: Insufficient documentation

## 2024-01-05 DIAGNOSIS — Z7951 Long term (current) use of inhaled steroids: Secondary | ICD-10-CM | POA: Insufficient documentation

## 2024-01-05 DIAGNOSIS — D509 Iron deficiency anemia, unspecified: Secondary | ICD-10-CM | POA: Insufficient documentation

## 2024-01-05 DIAGNOSIS — Z9981 Dependence on supplemental oxygen: Secondary | ICD-10-CM | POA: Insufficient documentation

## 2024-01-05 DIAGNOSIS — Z791 Long term (current) use of non-steroidal anti-inflammatories (NSAID): Secondary | ICD-10-CM | POA: Insufficient documentation

## 2024-01-05 DIAGNOSIS — I509 Heart failure, unspecified: Secondary | ICD-10-CM | POA: Insufficient documentation

## 2024-01-05 DIAGNOSIS — I110 Hypertensive heart disease with heart failure: Secondary | ICD-10-CM | POA: Insufficient documentation

## 2024-01-05 DIAGNOSIS — G9340 Encephalopathy, unspecified: Secondary | ICD-10-CM | POA: Insufficient documentation

## 2024-01-05 DIAGNOSIS — Z794 Long term (current) use of insulin: Secondary | ICD-10-CM | POA: Insufficient documentation

## 2024-01-05 DIAGNOSIS — J432 Centrilobular emphysema: Secondary | ICD-10-CM | POA: Insufficient documentation

## 2024-01-05 DIAGNOSIS — J440 Chronic obstructive pulmonary disease with acute lower respiratory infection: Secondary | ICD-10-CM | POA: Insufficient documentation

## 2024-01-05 DIAGNOSIS — Z7982 Long term (current) use of aspirin: Secondary | ICD-10-CM | POA: Insufficient documentation

## 2024-01-05 DIAGNOSIS — Z7984 Long term (current) use of oral hypoglycemic drugs: Secondary | ICD-10-CM | POA: Insufficient documentation

## 2024-01-05 DIAGNOSIS — E119 Type 2 diabetes mellitus without complications: Secondary | ICD-10-CM | POA: Insufficient documentation

## 2024-01-05 DIAGNOSIS — Z86718 Personal history of other venous thrombosis and embolism: Secondary | ICD-10-CM | POA: Insufficient documentation

## 2024-01-05 DIAGNOSIS — E785 Hyperlipidemia, unspecified: Secondary | ICD-10-CM | POA: Insufficient documentation

## 2024-01-05 DIAGNOSIS — C3411 Malignant neoplasm of upper lobe, right bronchus or lung: Secondary | ICD-10-CM | POA: Insufficient documentation

## 2024-01-05 DIAGNOSIS — Z87891 Personal history of nicotine dependence: Secondary | ICD-10-CM | POA: Insufficient documentation

## 2024-01-05 NOTE — Progress Notes (Signed)
 [facility] CSW Progress Note  Clinical Child psychotherapist contacted caregiver by phone to address upcoming appointments.  Onalee Hua, patient's son, asked if patient was beginning radiation on 3/7.  Informed him the only appointment in her chart was with Dr. Alena Bills.  Encouraged him to obtain access to MyChart.  Will securely email him directions/phone number at [email or web address].  He expressed no other needs.    Dorothey Baseman, LCSW Clinical Social Worker [facility]

## 2024-01-07 ENCOUNTER — Encounter: Payer: Self-pay | Admitting: Internal Medicine

## 2024-01-07 ENCOUNTER — Inpatient Hospital Stay (HOSPITAL_BASED_OUTPATIENT_CLINIC_OR_DEPARTMENT_OTHER): Payer: 59 | Admitting: Internal Medicine

## 2024-01-07 ENCOUNTER — Inpatient Hospital Stay

## 2024-01-07 ENCOUNTER — Encounter: Payer: Self-pay | Admitting: *Deleted

## 2024-01-07 VITALS — BP 145/77 | HR 77 | Temp 97.2°F | Resp 14

## 2024-01-07 DIAGNOSIS — Z9981 Dependence on supplemental oxygen: Secondary | ICD-10-CM | POA: Diagnosis not present

## 2024-01-07 DIAGNOSIS — C3490 Malignant neoplasm of unspecified part of unspecified bronchus or lung: Secondary | ICD-10-CM

## 2024-01-07 DIAGNOSIS — E785 Hyperlipidemia, unspecified: Secondary | ICD-10-CM | POA: Diagnosis not present

## 2024-01-07 DIAGNOSIS — G9340 Encephalopathy, unspecified: Secondary | ICD-10-CM | POA: Diagnosis not present

## 2024-01-07 DIAGNOSIS — D509 Iron deficiency anemia, unspecified: Secondary | ICD-10-CM

## 2024-01-07 DIAGNOSIS — Z7951 Long term (current) use of inhaled steroids: Secondary | ICD-10-CM | POA: Diagnosis not present

## 2024-01-07 DIAGNOSIS — Z803 Family history of malignant neoplasm of breast: Secondary | ICD-10-CM | POA: Diagnosis not present

## 2024-01-07 DIAGNOSIS — C3411 Malignant neoplasm of upper lobe, right bronchus or lung: Secondary | ICD-10-CM | POA: Diagnosis present

## 2024-01-07 DIAGNOSIS — Z86718 Personal history of other venous thrombosis and embolism: Secondary | ICD-10-CM | POA: Diagnosis not present

## 2024-01-07 DIAGNOSIS — J432 Centrilobular emphysema: Secondary | ICD-10-CM | POA: Diagnosis not present

## 2024-01-07 DIAGNOSIS — I509 Heart failure, unspecified: Secondary | ICD-10-CM | POA: Diagnosis not present

## 2024-01-07 DIAGNOSIS — Z791 Long term (current) use of non-steroidal anti-inflammatories (NSAID): Secondary | ICD-10-CM | POA: Diagnosis not present

## 2024-01-07 DIAGNOSIS — I700 Atherosclerosis of aorta: Secondary | ICD-10-CM | POA: Diagnosis not present

## 2024-01-07 DIAGNOSIS — Z7982 Long term (current) use of aspirin: Secondary | ICD-10-CM | POA: Diagnosis not present

## 2024-01-07 DIAGNOSIS — Z794 Long term (current) use of insulin: Secondary | ICD-10-CM | POA: Diagnosis not present

## 2024-01-07 DIAGNOSIS — Z79899 Other long term (current) drug therapy: Secondary | ICD-10-CM | POA: Diagnosis not present

## 2024-01-07 DIAGNOSIS — I6523 Occlusion and stenosis of bilateral carotid arteries: Secondary | ICD-10-CM | POA: Diagnosis not present

## 2024-01-07 DIAGNOSIS — E119 Type 2 diabetes mellitus without complications: Secondary | ICD-10-CM | POA: Diagnosis not present

## 2024-01-07 DIAGNOSIS — Z7984 Long term (current) use of oral hypoglycemic drugs: Secondary | ICD-10-CM | POA: Diagnosis not present

## 2024-01-07 DIAGNOSIS — J440 Chronic obstructive pulmonary disease with acute lower respiratory infection: Secondary | ICD-10-CM | POA: Diagnosis not present

## 2024-01-07 DIAGNOSIS — I110 Hypertensive heart disease with heart failure: Secondary | ICD-10-CM | POA: Diagnosis not present

## 2024-01-07 DIAGNOSIS — Z87891 Personal history of nicotine dependence: Secondary | ICD-10-CM | POA: Diagnosis not present

## 2024-01-07 NOTE — Progress Notes (Signed)
 Called and spoke with pt's daughter to confirm appts for port placement and chemo class. Pt's daughter is aware of both appts and confirmed she will be present for both. [facility] made aware of all scheduled appts to assist with transportation. Number left with front desk to request nurse to call back to review pre-procedure instructions for port placement.

## 2024-01-07 NOTE — Progress Notes (Signed)
 Met with patient during follow up appt with Dr. Alena Bills. Pt was somnolent throughout visit and did not voice many concerns or questions. Appts printed and given to patient for facility. Will follow up with pt's daughter, Gunnar Fusi, to review appts for chemo class and port placement which she will need to be present for. [facility] liquid biopsy collected during visit today. Financial assistance application will be completed by phone with her daughter later today. Nothing further needed at this time.

## 2024-01-07 NOTE — Patient Instructions (Signed)
 We will schedule you for a Port A Cath placement. You will need to have someone be with you and stay with you during the port a cath placement. You will not be ale to eat/drink anything after midnight on the day prior to the port placement. You will need to report to the heart/vascular dept at [facility] (which is located in the front of the building at [facility]).

## 2024-01-07 NOTE — Progress Notes (Signed)
 [facility] CONSULT NOTE  Patient Care Team: Center, [facility] as PCP - General (General Practice) Jim Like, RN as Registered Nurse Scarlett Presto, RN (Inactive) as Registered Nurse Michaelyn Barter, MD as Consulting Physician (Oncology) Glory Buff, RN as Oncology Nurse Navigator  Reason for visit- right upper lobe lung non-small cell cancer.  CANCER STAGING   Cancer Staging  Cancer of upper lobe of right lung [facility]) Staging form: Lung, AJCC V9 - Clinical stage from 11/12/2023: Stage IIIA (cT3, cN1, cM0) - Signed by Michaelyn Barter, MD on 01/07/2024   ASSESSMENT & PLAN:  Michele House 68 y.o. 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  # RUL NSCLC - Status post EBUS with biopsy with Dr.Aleskerov.  Right upper lobe lung biopsy showed non-small cell carcinoma.  Coexpress is TTF-1 and p40.  With TTF-1 expression, could be adenocarcinoma.  Insufficient material for ancillary molecular testing. RUL bronchial lavage positive for malignancy. Station 7 lymph node negative for malignancy.  -PET scan from April 22, 2024 showed 1.8 cm nodule in the right upper lobe with SUV of 37.5.  6 mm nodule more superiorly hypermetabolic for size could be satellite lesion.  There are several small hypermetabolic right hilar lymph nodes largest with SUV of 16.  No evidence of metastatic disease otherwise.  -I discussed with the patient, her son and daughter about the staging, diagnosis, treatment.  She has stage IIIA lung cancer which can be treated with concurrent chemo RT.  She is rescheduled to see Dr. Aggie Cosier on March 17.  I discussed about weekly carboplatin and Taxol for 6 weeks.  Side effects such as decreased blood count, increased risk of infection, neuropathy, liver dysfunction, renal dysfunction, decreased appetite,  fatigue, weakness was discussed.  Will get port placement.  Updated MRI brain with and without contrast to rule out metastatic disease.  Scheduled for chemo class.  There is insufficient biopsy sample for NGS testing.  Will send for liquid Tempus to assess for any targetable mutation.  Today, patient was little drowsy in the clinic but easily arousable and appropriately answering questions.  She was able to repeat back the information to me.  She tells me that she did not sleep well at night.  Follow-up to be coordinated with radiation appointment.  # COPD on 2 L oxygen -Continue follow-up with pulmonary  # Iron deficiency anemia -Hemoglobin 10.6.  Ferritin 37 and saturation 10%. -Will start her on IV Venofer with chemo treatments.  # History of GI bleed -s/p EGD which showed bleeding duodenal ulcer with clip placed 10/28/2023.  - s/p IR embolization on 10/30/2023.  Plavix was discontinued.  She was continued on baby aspirin.  # History of DVT - Had bilateral lower extremity Dopplers for leg pain.  Showed right calf DVT.   - Status post IVC filter on 11/09/2023.   Orders Placed This Encounter  Procedures   MR Brain W Wo Contrast    Standing Status:   Future    Expected Date:   01/07/2024    Expiration Date:   01/06/2025    If indicated for the ordered procedure, I authorize the administration of contrast media per Radiology protocol:   Yes    What is the patient's sedation requirement?:   No Sedation    Does the patient have a pacemaker or implanted devices?:  No    Use SRS Protocol?:   No    Preferred imaging location?:   [facility] (table limit - 550lbs)   IR IMAGING GUIDED PORT INSERTION    Standing Status:   Future    Expected Date:   01/14/2024    Expiration Date:   01/06/2025    Reason for Exam (SYMPTOM  OR DIAGNOSIS REQUIRED):   port a cath placement    Preferred Imaging Location?:   [facility]   Miscellaneous test (send-out)    Standing Status:   Future    Number of  Occurrences:   1    Expected Date:   01/07/2024    Expiration Date:   01/06/2025    Test name / description::   tempus    The total time spent in the appointment was 45 minutes encounter with patients including review of chart and various tests results, discussions about plan of care and coordination of care plan   All questions were answered. The patient knows to call the clinic with any problems, questions or concerns. No barriers to learning was detected.  Michaelyn Barter, MD 3/7/202511:11 AM   HISTORY OF PRESENTING ILLNESS:  Michele House 68 y.o. 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  Patient had long complicated hospital course from 10/02/2023 to 11/15/2023.  Presented with COPD exacerbation, pneumonia, UTI and encephalopathy.  Requiring intubation.   CTA chest from 09/24/2023 showed increase in size of spiculated pulmonary nodule in the inferior right upper lobe 1.6 cm compared to 1.3 cm previously.  Initial lung biopsy on 10/20/2023 was nondiagnostic.    Repeat EBUS with biopsy with Dr.Aleskerov on 11/12/2023.  Right upper lobe lung biopsy showed non-small cell carcinoma.  Coexpress is TTF-1 and p40.  Insufficient material for ancillary molecular testing. RUL bronchial lavage positive for malignancy. Station 7 lymph node negative for malignancy.  Hospital course complicated by encephalopathy.  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  EEG showed diffuse slowing without seizures.  LP unremarkable.  Autoimmune/paraneoplastic panel sent.  Treated with 5 days of high-dose Solu-Medrol with not much improvement.  Also developed GI bleed causing  hemorrhagic shock. s/p EGD which showed bleeding duodenal ulcer with clip placed 10/28/2023. s/p IR embolization on 10/30/2023.  Plavix was discontinued.  She was continued on baby aspirin.  Had bilateral lower extremity Dopplers for leg pain.  Showed right calf DVT.  Status post IVC filter on 11/09/2023.  Interval history Patient was seen today in the clinic.  Son was on the phone. Patient was feeling tired and lousy today.  Tells me she did not have a good night sleep.  But she was easily arousable and appropriately answering questions.  I have reviewed her chart and materials related to her cancer extensively and collaborated history with the patient. Summary of oncologic history is as follows: Oncology History   No history exists.    MEDICAL HISTORY:  Past Medical History:  Diagnosis Date   Acid reflux    COPD (chronic obstructive pulmonary disease) (HCC)    Diabetes mellitus without complication (HCC)    Hyperlipidemia    Hypertension    Thyroid disease     SURGICAL HISTORY: Past Surgical History:  Procedure  Laterality Date   appendectomy     APPENDECTOMY     BREAST BIOPSY Right    CORE W/CLIP - NEG   ESOPHAGOGASTRODUODENOSCOPY N/A 10/28/2023   Procedure: ESOPHAGOGASTRODUODENOSCOPY (EGD);  Surgeon: Midge Minium, MD;  Location: [facility] ENDOSCOPY;  Service: Endoscopy;  Laterality: N/A;   FLEXIBLE BRONCHOSCOPY Bilateral 11/12/2023   Procedure: FLEXIBLE BRONCHOSCOPY;  Surgeon: Vida Rigger, MD;  Location: [facility] ORS;  Service: Thoracic;  Laterality: Bilateral;   IR EMBO ART  VEN HEMORR LYMPH EXTRAV  INC GUIDE ROADMAPPING  10/30/2023   IVC FILTER INSERTION N/A 11/09/2023   Procedure: IVC FILTER INSERTION;  Surgeon: Annice Needy, MD;  Location: [facility] INVASIVE CV LAB;  Service: Cardiovascular;  Laterality: N/A;   TOTAL VAGINAL HYSTERECTOMY     TUMOR REMOVAL     benign;stomach   VIDEO BRONCHOSCOPY WITH ENDOBRONCHIAL NAVIGATION Right 10/20/2023   Procedure: VIDEO BRONCHOSCOPY WITH  ENDOBRONCHIAL NAVIGATION;  Surgeon: Vida Rigger, MD;  Location: [facility] ORS;  Service: Thoracic;  Laterality: Right;   VIDEO BRONCHOSCOPY WITH ENDOBRONCHIAL NAVIGATION Right 11/12/2023   Procedure: VIDEO BRONCHOSCOPY WITH ENDOBRONCHIAL NAVIGATION;  Surgeon: Vida Rigger, MD;  Location: [facility] ORS;  Service: Thoracic;  Laterality: Right;    SOCIAL HISTORY: Social History   Socioeconomic History   Marital status: Single    Spouse name: Not on file   Number of children: Not on file   Years of education: Not on file   Highest education level: Not on file  Occupational History   Not on file  Tobacco Use   Smoking status: Former    Current packs/day: 0.00    Types: Cigarettes    Quit date: 12/15/1985    Years since quitting: 38.0   Smokeless tobacco: Never  Substance and Sexual Activity   Alcohol use: No   Drug use: No   Sexual activity: Not Currently  Other Topics Concern   Not on file  Social History Narrative   Not on file   Social Drivers of Health   Financial Resource Strain: Not on file  Food Insecurity: Patient Unable To Answer (10/03/2023)   Hunger Vital Sign    Worried About Running Out of Food in the Last Year: Patient unable to answer    Ran Out of Food in the Last Year: Patient unable to answer  Recent Concern: Food Insecurity - Food Insecurity Present (09/24/2023)   Hunger Vital Sign    Worried About Running Out of Food in the Last Year: Sometimes true    Ran Out of Food in the Last Year: Sometimes true  Transportation Needs: Patient Unable To Answer (10/03/2023)   PRAPARE - Transportation    Lack of Transportation (Medical): Patient unable to answer    Lack of Transportation (Non-Medical): Patient unable to answer  Recent Concern: Transportation Needs - Unmet Transportation Needs (09/24/2023)   PRAPARE - Administrator, Civil Service (Medical): Yes    Lack of Transportation (Non-Medical): Yes  Physical Activity: Not on file  Stress: Not on file   Social Connections: Patient Declined (11/02/2023)   Social Connection and Isolation Panel [NHANES]    Frequency of Communication with Friends and Family: Patient declined    Frequency of Social Gatherings with Friends and Family: Patient declined    Attends Religious Services: Patient declined    Active Member of Clubs or Organizations: Patient declined    Attends Banker Meetings: Patient declined    Marital Status: Patient declined  Intimate Partner Violence: Patient Unable To Answer (10/03/2023)  Humiliation, Afraid, Rape, and Kick questionnaire    Fear of Current or Ex-Partner: Patient unable to answer    Emotionally Abused: Patient unable to answer    Physically Abused: Patient unable to answer    Sexually Abused: Patient unable to answer    FAMILY HISTORY: Family History  Problem Relation Age of Onset   Heart attack Mother    Hypertension Mother    Breast cancer Sister 17   Breast cancer Maternal Aunt        40'S   Breast cancer Maternal Grandmother     ALLERGIES:  is allergic to penicillins, aspirin, and sulfa antibiotics.  MEDICATIONS:  Current Outpatient Medications  Medication Sig Dispense Refill   aspirin EC 81 MG tablet Take 81 mg by mouth daily.     atorvastatin (LIPITOR) 20 MG tablet Take 20 mg by mouth daily.     calcium carbonate (TUMS - DOSED IN MG ELEMENTAL CALCIUM) 500 MG chewable tablet Chew 1 tablet by mouth daily.     cetirizine (ZYRTEC) 10 MG tablet Take 10 mg by mouth daily.     COMBIVENT RESPIMAT 20-100 MCG/ACT AERS respimat Inhale into the lungs every 6 (six) hours as needed for wheezing or shortness of breath.     dextromethorphan-guaiFENesin (MUCINEX DM) 30-600 MG 12hr tablet Take 1 tablet by mouth 2 (two) times daily as needed for cough. 30 tablet 0   diclofenac sodium (VOLTAREN) 1 % GEL Apply topically 4 (four) times daily.     docusate sodium (COLACE) 100 MG capsule Take 1 capsule (100 mg total) by mouth 2 (two) times daily.      DULERA 200-5 MCG/ACT AERO Inhale 2 puffs into the lungs every 12 (twelve) hours.     furosemide (LASIX) 20 MG tablet Take 1 tablet (20 mg total) by mouth daily. 30 tablet 0   glipiZIDE (GLUCOTROL) 10 MG tablet Take 10 mg by mouth daily before breakfast.     insulin aspart (NOVOLOG) 100 UNIT/ML injection Inject 4 Units into the skin 3 (three) times daily with meals.     insulin glargine-yfgn (SEMGLEE) 100 UNIT/ML injection Inject 0.18 mLs (18 Units total) into the skin daily.     ipratropium (ATROVENT HFA) 17 MCG/ACT inhaler Inhale 2 puffs into the lungs every 6 (six) hours.     levETIRAcetam (KEPPRA) 500 MG tablet Take 1 tablet (500 mg total) by mouth 2 (two) times daily.     levothyroxine (SYNTHROID) 150 MCG tablet Take 1 tablet (150 mcg total) by mouth daily before breakfast. Increased from 100.     [Paused] lisinopril-hydrochlorothiazide (PRINZIDE,ZESTORETIC) 20-25 MG per tablet Take 1 tablet by mouth daily.     loperamide (IMODIUM A-D) 2 MG tablet Take 2 mg by mouth 4 (four) times daily as needed for diarrhea or loose stools.     Multiple Vitamin (MULTIVITAMIN) capsule Take 1 capsule by mouth daily.     naloxone (NARCAN) nasal spray 4 mg/0.1 mL Place 1 spray into the nose.     ondansetron (ZOFRAN) 4 MG/5ML solution Take by mouth once.     pantoprazole (PROTONIX) 40 MG tablet Take 1 tablet (40 mg total) by mouth 2 (two) times daily before a meal.     polyethylene glycol (MIRALAX / GLYCOLAX) 17 g packet Take 17 g by mouth daily.     pregabalin (LYRICA) 50 MG capsule Take 50 mg by mouth 2 (two) times daily.     rOPINIRole (REQUIP XL) 4 MG 24 hr tablet Take 1 tablet (4 mg  total) by mouth at bedtime.     sertraline (ZOLOFT) 100 MG tablet Take 100 mg by mouth daily.     TRADJENTA 5 MG TABS tablet Take 5 mg by mouth daily.     No current facility-administered medications for this visit.    REVIEW OF SYSTEMS:   Pertinent information mentioned in HPI All other systems were reviewed with the  patient and are negative.  PHYSICAL EXAMINATION: ECOG PERFORMANCE STATUS: 3 - Symptomatic, >50% confined to bed  Vitals:   01/07/24 1019  BP: (!) 145/77  Pulse: 77  Resp: 14  Temp: (!) 97.2 F (36.2 C)  SpO2: 95%   There were no vitals filed for this visit.  GENERAL:alert, no distress and comfortable SKIN: skin color, texture, turgor are normal, no rashes or significant lesions EYES: normal, conjunctiva are pink and non-injected, sclera clear OROPHARYNX:no exudate, no erythema and lips, buccal mucosa, and tongue normal  NECK: supple, thyroid normal size, non-tender, without nodularity LYMPH:  no palpable lymphadenopathy in the cervical, axillary or inguinal LUNGS: clear to auscultation and percussion with normal breathing effort HEART: regular rate & rhythm and no murmurs and no lower extremity edema ABDOMEN:abdomen soft, non-tender and normal bowel sounds Musculoskeletal:no cyanosis of digits and no clubbing  PSYCH: alert & oriented x 3 with fluent speech NEURO: no focal motor/sensory deficits  LABORATORY DATA:  I have reviewed the data as listed Lab Results  Component Value Date   WBC 3.2 (L) 12/17/2023   HGB 10.6 (L) 12/17/2023   HCT 34.0 (L) 12/17/2023   MCV 85.6 12/17/2023   PLT 197 12/17/2023   Recent Labs    10/24/23 0614 10/25/23 0415 10/28/23 1450 10/28/23 2006 11/20/23 1304 11/26/23 0538 12/17/23 1109  NA 132*   < > 129*   < > 140 141 139  K 4.1   < > 5.4*   < > 3.5 3.5 3.2*  CL 87*   < > 83*   < > 99 102 100  CO2 34*   < > 35*   < > 29 29 29   GLUCOSE 235*   < > 536*   < > 181* 143* 155*  BUN 23   < > 84*   < > 17 21 10   CREATININE 0.77   < > 1.23*   < > 0.86 0.65 0.62  CALCIUM 8.9   < > 8.9   < > 9.1 9.1 9.0  GFRNONAA >60   < > 48*   < > >60 >60 >60  PROT 6.0*  --  5.5*  --   --   --  6.4*  ALBUMIN 2.8*  --  2.7*  --   --   --  3.4*  AST 22  --  30  --   --   --  17  ALT 20  --  27  --   --   --  12  ALKPHOS 72  --  71  --   --   --  59   BILITOT 0.7  --  0.6  --   --   --  0.4   < > = values in this interval not displayed.    RADIOGRAPHIC STUDIES: I have personally reviewed the radiological images as listed and agreed with the findings in the report. NM PET Image Initial (PI) Skull Base To Thigh Result Date: 12/24/2023 CLINICAL DATA:  Initial treatment strategy for lung cancer (non-small cell carcinoma on bronchoscopy 11/12/2023. EXAM: NUCLEAR MEDICINE PET SKULL BASE  TO THIGH TECHNIQUE: 9.27 mCi F-18 FDG was injected intravenously. Full-ring PET imaging was performed from the skull base to thigh after the radiotracer. CT data was obtained and used for attenuation correction and anatomic localization. Fasting blood glucose: 109 mg/dl COMPARISON:  Chest CT 73/71/0626 and 10/28/2023. FINDINGS: Mediastinal blood pool activity: SUV max 2.7 NECK: No hypermetabolic cervical lymph nodes are identified.Fairly symmetric activity within the lymphoid tissue of Waldeyer's ring is within physiologic limits. No suspicious activity identified within the pharyngeal mucosal space. Incidental CT findings: Bilateral carotid atherosclerosis. CHEST: Pulmonary assessment mildly limited by breathing artifact. The dominant spiculated nodule inferiorly in the right upper lobe demonstrates intense hypermetabolic activity with an SUV max of 37.5. This nodule measures approximately 1.8 cm on image 49/6. 6 mm nodule more superiorly in the right upper lobe on image 42/6 is mildly hypermetabolic for size (SUV max 2.6). No hypermetabolic pulmonary activity or suspicious nodularity in the left lung. There are several small hypermetabolic right hilar lymph nodes, largest within SUV max of 16.0. No hypermetabolic mediastinal or axillary lymph nodes. Incidental CT findings: Atherosclerosis of the aorta, great vessels and coronary arteries. Mild centrilobular and paraseptal emphysema with scattered pulmonary scarring. The ground-glass nodule in the right middle lobe is grossly  stable, measuring 1.1 cm on image 54/6. No new significant hypermetabolic activity. ABDOMEN/PELVIS: There is no hypermetabolic activity within the liver, adrenal glands, spleen or pancreas. There is no hypermetabolic nodal activity in the abdomen or pelvis. Incidental CT findings: Small dependent gallstone. Calcified granulomas in the spleen. Embolization coil in the gastrohepatic ligament. Aortic and branch vessel atherosclerosis without IVC filter. There is prominent stool in the rectum with perirectal soft tissue stranding and low level hypermetabolic activity. SKELETON: There is no hypermetabolic activity to suggest osseous metastatic disease. Focal activity at the left 10th costovertebral junction appears degenerative. Incidental CT findings: Multilevel thoracolumbar spondylosis. Asymmetric right AC joint arthropathy. IMPRESSION: 1. The dominant spiculated nodule inferiorly in the right upper lobe demonstrates intense hypermetabolic activity, consistent with primary bronchogenic carcinoma. 2. A smaller nodule more superiorly in the right upper lobe is mildly hypermetabolic for size and suspicious for a satellite lesion. 3. Hypermetabolic right hilar lymph nodes, consistent with nodal metastases. 4. No evidence of metastatic disease in the abdomen or pelvis. 5. Prominent stool in the rectum with perirectal soft tissue stranding and low level hypermetabolic activity, suspicious for stercoral colitis. 6. Aortic Atherosclerosis (ICD10-I70.0) and Emphysema (ICD10-J43.9). Electronically Signed   By: Carey Bullocks M.D.   On: 12/24/2023 15:24

## 2024-01-07 NOTE — Progress Notes (Signed)
 START ON PATHWAY REGIMEN - Non-Small Cell Lung     A cycle is every 7 days, concurrent with RT:     Paclitaxel      Carboplatin   **Always confirm dose/schedule in your pharmacy ordering system**  Patient Characteristics: Preoperative or Nonsurgical Candidate (Clinical Staging), Stage IIB (N2a only) or Stage III - Nonsurgical Candidate, PS = 0,1 Therapeutic Status: Preoperative or Nonsurgical Candidate (Clinical Staging) AJCC T Category: cT3 AJCC N Category: cN1 AJCC M Category: cM0 AJCC 9 Stage Grouping: IIIA Check here if patient was staged using an edition other than AJCC Staging 9th Edition: false ECOG Performance Status: 1 Intent of Therapy: Curative Intent, Discussed with Patient

## 2024-01-07 NOTE — Progress Notes (Signed)
 Patient did have a PET scan on 12/24/2023.

## 2024-01-08 ENCOUNTER — Other Ambulatory Visit: Payer: Self-pay

## 2024-01-10 ENCOUNTER — Encounter: Payer: Self-pay | Admitting: *Deleted

## 2024-01-10 NOTE — Progress Notes (Signed)
 Spoke with Facilities manager at [facility], [place], to review all upcoming appts and pre-procedure instructions prior to port placement.

## 2024-01-12 ENCOUNTER — Ambulatory Visit
Admission: RE | Admit: 2024-01-12 | Discharge: 2024-01-12 | Disposition: A | Discharge status: Home / Self Care | Source: Ambulatory Visit | Attending: Internal Medicine | Admitting: Internal Medicine

## 2024-01-12 DIAGNOSIS — C3490 Malignant neoplasm of unspecified part of unspecified bronchus or lung: Secondary | ICD-10-CM | POA: Insufficient documentation

## 2024-01-12 MED ORDER — GADOBUTROL 1 MMOL/ML IV SOLN
7.0000 mL | Freq: Once | INTRAVENOUS | Status: AC | PRN
Start: 2024-01-12 — End: 2024-01-12
  Administered 2024-01-12: 7 mL via INTRAVENOUS

## 2024-01-13 ENCOUNTER — Telehealth: Payer: Self-pay | Admitting: Internal Medicine

## 2024-01-13 ENCOUNTER — Encounter: Payer: Self-pay | Admitting: Internal Medicine

## 2024-01-13 ENCOUNTER — Inpatient Hospital Stay

## 2024-01-13 ENCOUNTER — Telehealth: Payer: Self-pay | Admitting: *Deleted

## 2024-01-13 DIAGNOSIS — C3411 Malignant neoplasm of upper lobe, right bronchus or lung: Secondary | ICD-10-CM

## 2024-01-13 MED ORDER — ONDANSETRON HCL 8 MG PO TABS: 8.0000 mg | ORAL_TABLET | Freq: Three times a day (TID) | ORAL | 1 refills | Status: DC | PRN

## 2024-01-13 MED ORDER — LIDOCAINE-PRILOCAINE 2.5-2.5 % EX CREA: TOPICAL_CREAM | CUTANEOUS | 3 refills | Status: DC

## 2024-01-13 MED ORDER — DEXAMETHASONE 4 MG PO TABS: ORAL_TABLET | ORAL | 1 refills | Status: DC

## 2024-01-13 MED ORDER — PROCHLORPERAZINE MALEATE 10 MG PO TABS: 10.0000 mg | ORAL_TABLET | Freq: Four times a day (QID) | ORAL | 1 refills | Status: DC | PRN

## 2024-01-13 NOTE — Telephone Encounter (Signed)
 Scott-pharmacist from [facility]. Stated that he called triage line and left a msg but has not heard back. Scott stated that medications were sent here around 2:30pm and states that the medications will have to be sent to the nursing home ([facility]) for them to fill.

## 2024-01-13 NOTE — Telephone Encounter (Signed)
 I called back to the pharmacy and left message that pt will need the emla cream for every time she gets treatment and not it is once a week. If they have issues let us know

## 2024-01-14 ENCOUNTER — Telehealth: Payer: Self-pay | Admitting: *Deleted

## 2024-01-14 ENCOUNTER — Encounter: Payer: Self-pay | Admitting: Internal Medicine

## 2024-01-14 ENCOUNTER — Encounter: Payer: Self-pay | Admitting: Radiology

## 2024-01-14 NOTE — H&P (Signed)
 Chief Complaint: Non small cell lung cancer (RUL). Request is for portacath for chemotherapy access  Referring Physician(s): Agrawal,Kavita  Supervising Physician: Irish Lack  Patient Status: [facility] - Out-pt  History of Present Illness: Michele House is a 68 y.o. female outpatient. History of HTN. HLD, DM, GERD, COPD. 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 Team is requesting portacath placement for chemotherapy.   Currently without any significant complaints. Patient alert and laying in bed,calm. Denies any fevers, headache, chest pain, SOB, cough, abdominal pain, nausea, vomiting or bleeding.   PET scan from 2.21.25 shows diminutive [facility]. Marland Kitchen No line history per EPIC. No recent labs. All medications are within acceptable parameters. No pertinent allergies. Patient has been NPO since midnight.   Return precautions and treatment recommendations and follow-up discussed with the patient  who is agreeable with the plan.   Past Medical History:  Diagnosis Date   Acid reflux    COPD (chronic obstructive pulmonary disease) (HCC)    Diabetes mellitus without complication (HCC)    Hyperlipidemia    Hypertension    Thyroid disease     Past Surgical History:  Procedure Laterality Date   appendectomy     APPENDECTOMY     BREAST BIOPSY Right    CORE W/CLIP - NEG   ESOPHAGOGASTRODUODENOSCOPY N/A 10/28/2023   Procedure: ESOPHAGOGASTRODUODENOSCOPY (EGD);  Surgeon: Midge Minium, MD;  Location: [facility] ENDOSCOPY;  Service: Endoscopy;  Laterality: N/A;   FLEXIBLE BRONCHOSCOPY Bilateral 11/12/2023   Procedure: FLEXIBLE BRONCHOSCOPY;  Surgeon: Vida Rigger, MD;  Location: [facility] ORS;  Service: Thoracic;  Laterality: Bilateral;   IR EMBO ART  VEN HEMORR LYMPH EXTRAV  INC GUIDE ROADMAPPING  10/30/2023   IVC FILTER INSERTION N/A 11/09/2023   Procedure: IVC FILTER INSERTION;  Surgeon: Annice Needy, MD;  Location: [facility] INVASIVE CV LAB;  Service: Cardiovascular;  Laterality:  N/A;   TOTAL VAGINAL HYSTERECTOMY     TUMOR REMOVAL     benign;stomach   VIDEO BRONCHOSCOPY WITH ENDOBRONCHIAL NAVIGATION Right 10/20/2023   Procedure: VIDEO BRONCHOSCOPY WITH ENDOBRONCHIAL NAVIGATION;  Surgeon: Vida Rigger, MD;  Location: [facility] ORS;  Service: Thoracic;  Laterality: Right;   VIDEO BRONCHOSCOPY WITH ENDOBRONCHIAL NAVIGATION Right 11/12/2023   Procedure: VIDEO BRONCHOSCOPY WITH ENDOBRONCHIAL NAVIGATION;  Surgeon: Vida Rigger, MD;  Location: [facility] ORS;  Service: Thoracic;  Laterality: Right;    Allergies: Penicillins, Aspirin, and Sulfa antibiotics  Medications: Prior to Admission medications   Medication Sig Start Date End Date Taking? Authorizing Provider  aspirin EC 81 MG tablet Take 81 mg by mouth daily.    [provider]  atorvastatin (LIPITOR) 20 MG tablet Take 20 mg by mouth daily. 06/25/23   [provider]  calcium carbonate (TUMS - DOSED IN MG ELEMENTAL CALCIUM) 500 MG chewable tablet Chew 1 tablet by mouth daily.    [provider]  cetirizine (ZYRTEC) 10 MG tablet Take 10 mg by mouth daily.    [provider]  COMBIVENT RESPIMAT 20-100 MCG/ACT AERS respimat Inhale into the lungs every 6 (six) hours as needed for wheezing or shortness of breath. 09/06/23   [provider]  dexamethasone (DECADRON) 4 MG tablet Take 2 tablets daily for 2 days, start the day after chemotherapy. Take with food. 01/13/24   Michaelyn Barter, MD  dextromethorphan-guaiFENesin [facility] DM) 30-600 MG 12hr tablet Take 1 tablet by mouth 2 (two) times daily as needed for cough. 06/08/23   Arnetha Courser, MD  diclofenac sodium (VOLTAREN) 1 % GEL Apply topically 4 (  four) times daily.    [provider]  docusate sodium (COLACE) 100 MG capsule Take 1 capsule (100 mg total) by mouth 2 (two) times daily. 12/02/23   Darlin Priestly, MD  DULERA 200-5 MCG/ACT AERO Inhale 2 puffs into the lungs every 12 (twelve) hours. 09/06/23   [provider]   furosemide (LASIX) 20 MG tablet Take 1 tablet (20 mg total) by mouth daily. 09/27/23   Darlin Drop, DO  glipiZIDE (GLUCOTROL) 10 MG tablet Take 10 mg by mouth daily before breakfast.    [provider]  insulin aspart (NOVOLOG) 100 UNIT/ML injection Inject 4 Units into the skin 3 (three) times daily with meals. 12/02/23   Darlin Priestly, MD  insulin glargine-yfgn (SEMGLEE) 100 UNIT/ML injection Inject 0.18 mLs (18 Units total) into the skin daily. 12/03/23   Darlin Priestly, MD  ipratropium (ATROVENT HFA) 17 MCG/ACT inhaler Inhale 2 puffs into the lungs every 6 (six) hours.    [provider]  levETIRAcetam (KEPPRA) 500 MG tablet Take 1 tablet (500 mg total) by mouth 2 (two) times daily. 12/02/23   Darlin Priestly, MD  levothyroxine (SYNTHROID) 150 MCG tablet Take 1 tablet (150 mcg total) by mouth daily before breakfast. Increased from 100. 12/02/23   Darlin Priestly, MD  lidocaine-prilocaine (EMLA) cream Apply to affected area once 01/13/24   Michaelyn Barter, MD  lisinopril-hydrochlorothiazide (PRINZIDE,ZESTORETIC) 20-25 MG per tablet Take 1 tablet by mouth daily.    [provider]  loperamide (IMODIUM A-D) 2 MG tablet Take 2 mg by mouth 4 (four) times daily as needed for diarrhea or loose stools.    [provider]  Multiple Vitamin (MULTIVITAMIN) capsule Take 1 capsule by mouth daily.    [provider]  naloxone [facility]) nasal spray 4 mg/0.1 mL Place 1 spray into the nose.    [provider]  ondansetron [facility]) 4 MG/5ML solution Take by mouth once.    [provider]  ondansetron (ZOFRAN) 8 MG tablet Take 1 tablet (8 mg total) by mouth every 8 (eight) hours as needed for nausea or vomiting. Start on the third day after chemotherapy. 01/13/24   Michaelyn Barter, MD  pantoprazole (PROTONIX) 40 MG tablet Take 1 tablet (40 mg total) by mouth 2 (two) times daily before a meal. 12/02/23   Darlin Priestly, MD  polyethylene glycol (MIRALAX / GLYCOLAX) 17 g packet Take 17  g by mouth daily. 12/03/23   Darlin Priestly, MD  pregabalin (LYRICA) 50 MG capsule Take 50 mg by mouth 2 (two) times daily.    [provider]  prochlorperazine (COMPAZINE) 10 MG tablet Take 1 tablet (10 mg total) by mouth every 6 (six) hours as needed for nausea or vomiting. 01/13/24   Michaelyn Barter, MD  rOPINIRole (REQUIP XL) 4 MG 24 hr tablet Take 1 tablet (4 mg total) by mouth at bedtime. 12/02/23   Darlin Priestly, MD  sertraline (ZOLOFT) 100 MG tablet Take 100 mg by mouth daily. 06/28/23   [provider]  TRADJENTA 5 MG TABS tablet Take 5 mg by mouth daily. 07/26/23   [provider]     Family History  Problem Relation Age of Onset   Heart attack Mother    Hypertension Mother    Breast cancer Sister 5   Breast cancer Maternal Aunt        40'S   Breast cancer Maternal Grandmother     Social History   Socioeconomic History   Marital status: Single  Spouse name: Not on file   Number of children: Not on file   Years of education: Not on file   Highest education level: Not on file  Occupational History   Not on file  Tobacco Use   Smoking status: Former    Current packs/day: 0.00    Types: Cigarettes    Quit date: 12/15/1985    Years since quitting: 38.1   Smokeless tobacco: Never  Substance and Sexual Activity   Alcohol use: No   Drug use: No   Sexual activity: Not Currently  Other Topics Concern   Not on file  Social History Narrative   Not on file   Social Drivers of Health   Financial Resource Strain: Not on file  Food Insecurity: Patient Unable To Answer (10/03/2023)   Hunger Vital Sign    Worried About Running Out of Food in the Last Year: Patient unable to answer    Ran Out of Food in the Last Year: Patient unable to answer  Recent Concern: Food Insecurity - Food Insecurity Present (09/24/2023)   Hunger Vital Sign    Worried About Running Out of Food in the Last Year: Sometimes true    Ran Out of Food in the Last Year: Sometimes true   Transportation Needs: Patient Unable To Answer (10/03/2023)   PRAPARE - Administrator, Civil Service (Medical): Patient unable to answer    Lack of Transportation (Non-Medical): Patient unable to answer  Recent Concern: Transportation Needs - Unmet Transportation Needs (09/24/2023)   PRAPARE - Administrator, Civil Service (Medical): Yes    Lack of Transportation (Non-Medical): Yes  Physical Activity: Not on file  Stress: Not on file  Social Connections: Patient Declined (11/02/2023)   Social Connection and Isolation Panel [NHANES]    Frequency of Communication with Friends and Family: Patient declined    Frequency of Social Gatherings with Friends and Family: Patient declined    Attends Religious Services: Patient declined    Database administrator or Organizations: Patient declined    Attends Banker Meetings: Patient declined    Marital Status: Patient declined    Review of Systems: A 12 point ROS discussed and pertinent positives are indicated in the HPI above.  All other systems are negative.  Review of Systems  Constitutional:  Negative for fatigue and fever.  HENT:  Negative for congestion.   Respiratory:  Negative for cough and shortness of breath.   Gastrointestinal:  Negative for abdominal pain, diarrhea, nausea and vomiting.    Vital Signs: LMP 12/24/1998 Comment: prior to her hysterectomy  Advance Care Plan: The advanced care plan/surrogate decision maker was discussed at the time of visit and the patient did not wish to discuss or was not able to name a surrogate decision maker or provide an advance care plan.    Physical Exam Vitals and nursing note reviewed.  Constitutional:      Appearance: She is well-developed. She is obese.  HENT:     Head: Normocephalic and atraumatic.     Mouth/Throat:     Mouth: Mucous membranes are dry.  Eyes:     Conjunctiva/sclera: Conjunctivae normal.  Pulmonary:     Effort: Pulmonary effort  is normal.  Musculoskeletal:        General: Normal range of motion.     Cervical back: Normal range of motion.  Skin:    General: Skin is warm and dry.     Coloration: Skin is pale.  Neurological:     General: No focal deficit present.     Mental Status: She is alert and oriented to person, place, and time. Mental status is at baseline.  Psychiatric:        Mood and Affect: Mood normal.        Behavior: Behavior normal.        Thought Content: Thought content normal.        Judgment: Judgment normal.     Imaging: NM PET Image Initial (PI) Skull Base To Thigh Result Date: 12/24/2023 CLINICAL DATA:  Initial treatment strategy for lung cancer (non-small cell carcinoma on bronchoscopy 11/12/2023. EXAM: NUCLEAR MEDICINE PET SKULL BASE TO THIGH TECHNIQUE: 9.27 mCi F-18 FDG was injected intravenously. Full-ring PET imaging was performed from the skull base to thigh after the radiotracer. CT data was obtained and used for attenuation correction and anatomic localization. Fasting blood glucose: 109 mg/dl COMPARISON:  Chest CT 16/08/9603 and 10/28/2023. FINDINGS: Mediastinal blood pool activity: SUV max 2.7 NECK: No hypermetabolic cervical lymph nodes are identified.Fairly symmetric activity within the lymphoid tissue of Waldeyer's ring is within physiologic limits. No suspicious activity identified within the pharyngeal mucosal space. Incidental CT findings: Bilateral carotid atherosclerosis. CHEST: Pulmonary assessment mildly limited by breathing artifact. The dominant spiculated nodule inferiorly in the right upper lobe demonstrates intense hypermetabolic activity with an SUV max of 37.5. This nodule measures approximately 1.8 cm on image 49/6. 6 mm nodule more superiorly in the right upper lobe on image 42/6 is mildly hypermetabolic for size (SUV max 2.6). No hypermetabolic pulmonary activity or suspicious nodularity in the left lung. There are several small hypermetabolic right hilar lymph nodes,  largest within SUV max of 16.0. No hypermetabolic mediastinal or axillary lymph nodes. Incidental CT findings: Atherosclerosis of the aorta, great vessels and coronary arteries. Mild centrilobular and paraseptal emphysema with scattered pulmonary scarring. The ground-glass nodule in the right middle lobe is grossly stable, measuring 1.1 cm on image 54/6. No new significant hypermetabolic activity. ABDOMEN/PELVIS: There is no hypermetabolic activity within the liver, adrenal glands, spleen or pancreas. There is no hypermetabolic nodal activity in the abdomen or pelvis. Incidental CT findings: Small dependent gallstone. Calcified granulomas in the spleen. Embolization coil in the gastrohepatic ligament. Aortic and branch vessel atherosclerosis without IVC filter. There is prominent stool in the rectum with perirectal soft tissue stranding and low level hypermetabolic activity. SKELETON: There is no hypermetabolic activity to suggest osseous metastatic disease. Focal activity at the left 10th costovertebral junction appears degenerative. Incidental CT findings: Multilevel thoracolumbar spondylosis. Asymmetric right AC joint arthropathy. IMPRESSION: 1. The dominant spiculated nodule inferiorly in the right upper lobe demonstrates intense hypermetabolic activity, consistent with primary bronchogenic carcinoma. 2. A smaller nodule more superiorly in the right upper lobe is mildly hypermetabolic for size and suspicious for a satellite lesion. 3. Hypermetabolic right hilar lymph nodes, consistent with nodal metastases. 4. No evidence of metastatic disease in the abdomen or pelvis. 5. Prominent stool in the rectum with perirectal soft tissue stranding and low level hypermetabolic activity, suspicious for stercoral colitis. 6. Aortic Atherosclerosis (ICD10-I70.0) and Emphysema (ICD10-J43.9). Electronically Signed   By: Carey Bullocks M.D.   On: 12/24/2023 15:24    Labs:  CBC: Recent Labs    11/15/23 0912  11/20/23 1304 11/26/23 0538 12/17/23 1109  WBC 4.3 4.9 4.6 3.2*  HGB 9.1* 10.4* 9.9* 10.6*  HCT 27.7* 32.0* 30.8* 34.0*  PLT 204 248 254 197    COAGS: Recent Labs    09/23/23  2314 10/28/23 1804  INR 1.1 1.2  APTT  --  26    BMP: Recent Labs    11/15/23 0912 11/20/23 1304 11/26/23 0538 12/17/23 1109  NA 139 140 141 139  K 3.8 3.5 3.5 3.2*  CL 98 99 102 100  CO2 32 29 29 29   GLUCOSE 203* 181* 143* 155*  BUN 10 17 21 10   CALCIUM 8.7* 9.1 9.1 9.0  CREATININE 0.54 0.86 0.65 0.62  GFRNONAA >60 >60 >60 >60    LIVER FUNCTION TESTS: Recent Labs    10/19/23 1252 10/24/23 0614 10/28/23 1450 12/17/23 1109  BILITOT <0.2 0.7 0.6 0.4  AST 18 22 30 17   ALT 18 20 27 12   ALKPHOS 70 72 71 59  PROT 5.4* 6.0* 5.5* 6.4*  ALBUMIN 3.1* 2.8* 2.7* 3.4*    Assessment and Plan:  68 y.o. female outpatient. History of HTN. HLD, DM, GERD, COPD. 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 Team is requesting portacath placement for chemotherapy.   PLAN: IR Image Guided Portacath Placement  Risks and benefits of image guided port-a-catheter placement was discussed with the patient including, but not limited to bleeding, infection, pneumothorax, or fibrin sheath development and need for additional procedures.  All of the patient's questions were answered, patient is agreeable to proceed. Consent signed and in chart.   Thank you for this interesting consult.  I greatly enjoyed meeting Michele House and look forward to participating in their care.  A copy of this report was sent to the requesting provider on this date.  Electronically Signed: Alene Mires, NP 01/18/2024, 9:40 AM   I spent a total of  30 Minutes   in face to face in clinical consultation, greater than 50% of which was counseling/coordinating care for portacath placement

## 2024-01-14 NOTE — Telephone Encounter (Signed)
 She says she has an appt 3/17 for new consult and that is to se radiation MD, then IR for port to get in. But I do not see when treatment chemo will start. She says on her paper she has 3/21. I told her that the treatment schedules at this time but I will send message to Dr. Alena Bills about the tx dates and let her know

## 2024-01-17 ENCOUNTER — Encounter: Payer: Self-pay | Admitting: *Deleted

## 2024-01-17 ENCOUNTER — Encounter: Payer: Self-pay | Admitting: Radiation Oncology

## 2024-01-17 ENCOUNTER — Other Ambulatory Visit: Payer: Self-pay | Admitting: Radiology

## 2024-01-17 ENCOUNTER — Ambulatory Visit
Admission: RE | Admit: 2024-01-17 | Discharge: 2024-01-17 | Disposition: A | Discharge status: Home / Self Care | Source: Ambulatory Visit | Attending: Radiation Oncology | Admitting: Radiation Oncology

## 2024-01-17 VITALS — BP 108/74 | HR 94 | Temp 97.7°F | Resp 16

## 2024-01-17 DIAGNOSIS — E785 Hyperlipidemia, unspecified: Secondary | ICD-10-CM | POA: Insufficient documentation

## 2024-01-17 DIAGNOSIS — Z9981 Dependence on supplemental oxygen: Secondary | ICD-10-CM | POA: Insufficient documentation

## 2024-01-17 DIAGNOSIS — Z79899 Other long term (current) drug therapy: Secondary | ICD-10-CM | POA: Insufficient documentation

## 2024-01-17 DIAGNOSIS — Z86718 Personal history of other venous thrombosis and embolism: Secondary | ICD-10-CM | POA: Insufficient documentation

## 2024-01-17 DIAGNOSIS — Z87891 Personal history of nicotine dependence: Secondary | ICD-10-CM | POA: Insufficient documentation

## 2024-01-17 DIAGNOSIS — J449 Chronic obstructive pulmonary disease, unspecified: Secondary | ICD-10-CM | POA: Insufficient documentation

## 2024-01-17 DIAGNOSIS — K219 Gastro-esophageal reflux disease without esophagitis: Secondary | ICD-10-CM | POA: Insufficient documentation

## 2024-01-17 DIAGNOSIS — R197 Diarrhea, unspecified: Secondary | ICD-10-CM | POA: Diagnosis not present

## 2024-01-17 DIAGNOSIS — E11649 Type 2 diabetes mellitus with hypoglycemia without coma: Secondary | ICD-10-CM

## 2024-01-17 DIAGNOSIS — D509 Iron deficiency anemia, unspecified: Secondary | ICD-10-CM | POA: Insufficient documentation

## 2024-01-17 DIAGNOSIS — Z7952 Long term (current) use of systemic steroids: Secondary | ICD-10-CM | POA: Diagnosis not present

## 2024-01-17 DIAGNOSIS — Z7984 Long term (current) use of oral hypoglycemic drugs: Secondary | ICD-10-CM | POA: Diagnosis not present

## 2024-01-17 DIAGNOSIS — Z7982 Long term (current) use of aspirin: Secondary | ICD-10-CM | POA: Diagnosis not present

## 2024-01-17 DIAGNOSIS — Z791 Long term (current) use of non-steroidal anti-inflammatories (NSAID): Secondary | ICD-10-CM | POA: Diagnosis not present

## 2024-01-17 DIAGNOSIS — Z510 Encounter for antineoplastic radiation therapy: Secondary | ICD-10-CM | POA: Diagnosis not present

## 2024-01-17 DIAGNOSIS — C3411 Malignant neoplasm of upper lobe, right bronchus or lung: Secondary | ICD-10-CM | POA: Insufficient documentation

## 2024-01-17 DIAGNOSIS — Z803 Family history of malignant neoplasm of breast: Secondary | ICD-10-CM | POA: Insufficient documentation

## 2024-01-17 DIAGNOSIS — Z794 Long term (current) use of insulin: Secondary | ICD-10-CM | POA: Diagnosis not present

## 2024-01-17 DIAGNOSIS — I10 Essential (primary) hypertension: Secondary | ICD-10-CM | POA: Insufficient documentation

## 2024-01-17 DIAGNOSIS — E119 Type 2 diabetes mellitus without complications: Secondary | ICD-10-CM | POA: Insufficient documentation

## 2024-01-17 NOTE — Progress Notes (Addendum)
 Met with patient during initial consult with Dr. Rushie Chestnut. All questions answered during visit. Reviewed upcoming appts with patient. Copy of appts given to patient for facility to arrange transportation. Nothing further needed at this time. Pt's daughter, Gunnar Fusi, called and updated on appts and treatment plan.

## 2024-01-17 NOTE — Consult Note (Signed)
 NEW PATIENT EVALUATION  Name: Michele House  MRN: [id number]  Date:   01/17/2024     DOB: 02/08/1956   This 68 y.o. female patient presents to the clinic for initial evaluation of clinical stage IIIa (cT3 N1 M0) non-small cell lung cancer of the right upper lobe consistent with adenocarcinoma.  REFERRING PHYSICIAN: [facility]  CHIEF COMPLAINT:  Chief Complaint  Patient presents with   Lung Cancer    DIAGNOSIS: The encounter diagnosis was 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).   PREVIOUS INVESTIGATIONS:  CT scans PET CT scans reviewed Clinical notes reviewed Cytology pathology reports reviewed  HPI: Patient is a 68 year old female with significant comorbidities including iron deficiency anemia COPD on 2 L of oxygen history of GI bleed DVT who was found to have a right upper lobe lesion.  Initial CT scan showed persistent d dominant spiculated mass in the inferior right upper lobe.  Is enlarged over time.  PET CT scan showed again hypermetabolic activity in the inferior right upper lobe lesion as as well as a smaller nodule more superior in the right upper lobe which is mildly hypermetabolic and suspicious for satellite lesion.  She also had hypermetabolic right hilar nodes consistent with metastatic disease.  No other evidence of distant metastatic disease was noted.  Patient underwent EBUS by Dr. Heloise Purpura and biopsy was positive for non-small cell lung cancer with coexpression of TTF-1 and p40 suggesting adenocarcinoma.  She is on constant 2 L of nasal oxygen.  She is seen today and is having a cough having some problems bringing up mucus and it she has tried Mucinex with no significant benefit.  She specifically denies hemoptysis.  She is wheelchair-bound on constant nasal oxygen seen today for radiation oncology opinion.  PLANNED TREATMENT REGIMEN: Concurrent chemoradiation  PAST MEDICAL HISTORY:  has a past medical history of Acid reflux, COPD (chronic obstructive  pulmonary disease) (HCC), Diabetes mellitus without complication (HCC), Hyperlipidemia, Hypertension, and Thyroid disease.    PAST SURGICAL HISTORY:  Past Surgical History:  Procedure Laterality Date   appendectomy     APPENDECTOMY     BREAST BIOPSY Right    CORE W/CLIP - NEG   ESOPHAGOGASTRODUODENOSCOPY N/A 10/28/2023   Procedure: ESOPHAGOGASTRODUODENOSCOPY (EGD);  Surgeon: Midge Minium, MD;  Location: [facility] ENDOSCOPY;  Service: Endoscopy;  Laterality: N/A;   FLEXIBLE BRONCHOSCOPY Bilateral 11/12/2023   Procedure: FLEXIBLE BRONCHOSCOPY;  Surgeon: Vida Rigger, MD;  Location: [facility] ORS;  Service: Thoracic;  Laterality: Bilateral;   IR EMBO ART  VEN HEMORR LYMPH EXTRAV  INC GUIDE ROADMAPPING  10/30/2023   IVC FILTER INSERTION N/A 11/09/2023   Procedure: IVC FILTER INSERTION;  Surgeon: Annice Needy, MD;  Location: [facility] INVASIVE CV LAB;  Service: Cardiovascular;  Laterality: N/A;   TOTAL VAGINAL HYSTERECTOMY     TUMOR REMOVAL     benign;stomach   VIDEO BRONCHOSCOPY WITH ENDOBRONCHIAL NAVIGATION Right 10/20/2023   Procedure: VIDEO BRONCHOSCOPY WITH ENDOBRONCHIAL NAVIGATION;  Surgeon: Vida Rigger, MD;  Location: [facility] ORS;  Service: Thoracic;  Laterality: Right;   VIDEO BRONCHOSCOPY WITH ENDOBRONCHIAL NAVIGATION Right 11/12/2023   Procedure: VIDEO BRONCHOSCOPY WITH ENDOBRONCHIAL NAVIGATION;  Surgeon: Vida Rigger, MD;  Location: [facility] ORS;  Service: Thoracic;  Laterality: Right;    FAMILY HISTORY: family history includes Breast cancer in her maternal aunt and maternal grandmother; Breast cancer (age of onset: 11) in her sister; Heart attack in her mother; Hypertension in her mother.  SOCIAL HISTORY:  reports that she quit smoking about  38 years ago. Her smoking use included cigarettes. She has never used smokeless tobacco. She reports that she does not drink alcohol and does not use drugs.  ALLERGIES: Penicillins, Aspirin, and Sulfa antibiotics  MEDICATIONS:  Current Outpatient  Medications  Medication Sig Dispense Refill   aspirin EC 81 MG tablet Take 81 mg by mouth daily.     atorvastatin (LIPITOR) 20 MG tablet Take 20 mg by mouth daily.     calcium carbonate (TUMS - DOSED IN MG ELEMENTAL CALCIUM) 500 MG chewable tablet Chew 1 tablet by mouth daily.     cetirizine (ZYRTEC) 10 MG tablet Take 10 mg by mouth daily.     COMBIVENT RESPIMAT 20-100 MCG/ACT AERS respimat Inhale into the lungs every 6 (six) hours as needed for wheezing or shortness of breath.     dexamethasone (DECADRON) 4 MG tablet Take 2 tablets daily for 2 days, start the day after chemotherapy. Take with food. 30 tablet 1   dextromethorphan-guaiFENesin (MUCINEX DM) 30-600 MG 12hr tablet Take 1 tablet by mouth 2 (two) times daily as needed for cough. 30 tablet 0   diclofenac sodium (VOLTAREN) 1 % GEL Apply topically 4 (four) times daily.     docusate sodium (COLACE) 100 MG capsule Take 1 capsule (100 mg total) by mouth 2 (two) times daily.     DULERA 200-5 MCG/ACT AERO Inhale 2 puffs into the lungs every 12 (twelve) hours.     furosemide (LASIX) 20 MG tablet Take 1 tablet (20 mg total) by mouth daily. 30 tablet 0   glipiZIDE (GLUCOTROL) 10 MG tablet Take 10 mg by mouth daily before breakfast.     insulin aspart (NOVOLOG) 100 UNIT/ML injection Inject 4 Units into the skin 3 (three) times daily with meals.     insulin glargine-yfgn (SEMGLEE) 100 UNIT/ML injection Inject 0.18 mLs (18 Units total) into the skin daily.     ipratropium (ATROVENT HFA) 17 MCG/ACT inhaler Inhale 2 puffs into the lungs every 6 (six) hours.     levETIRAcetam (KEPPRA) 500 MG tablet Take 1 tablet (500 mg total) by mouth 2 (two) times daily.     levothyroxine (SYNTHROID) 150 MCG tablet Take 1 tablet (150 mcg total) by mouth daily before breakfast. Increased from 100.     lidocaine-prilocaine (EMLA) cream Apply to affected area once 30 g 3   loperamide (IMODIUM A-D) 2 MG tablet Take 2 mg by mouth 4 (four) times daily as needed for diarrhea  or loose stools.     Multiple Vitamin (MULTIVITAMIN) capsule Take 1 capsule by mouth daily.     naloxone (NARCAN) nasal spray 4 mg/0.1 mL Place 1 spray into the nose.     ondansetron (ZOFRAN) 4 MG/5ML solution Take by mouth once.     ondansetron (ZOFRAN) 8 MG tablet Take 1 tablet (8 mg total) by mouth every 8 (eight) hours as needed for nausea or vomiting. Start on the third day after chemotherapy. 30 tablet 1   pantoprazole (PROTONIX) 40 MG tablet Take 1 tablet (40 mg total) by mouth 2 (two) times daily before a meal.     polyethylene glycol (MIRALAX / GLYCOLAX) 17 g packet Take 17 g by mouth daily.     pregabalin (LYRICA) 50 MG capsule Take 50 mg by mouth 2 (two) times daily.     prochlorperazine (COMPAZINE) 10 MG tablet Take 1 tablet (10 mg total) by mouth every 6 (six) hours as needed for nausea or vomiting. 30 tablet 1   rOPINIRole (REQUIP XL)  4 MG 24 hr tablet Take 1 tablet (4 mg total) by mouth at bedtime.     sertraline (ZOLOFT) 100 MG tablet Take 100 mg by mouth daily.     TRADJENTA 5 MG TABS tablet Take 5 mg by mouth daily.     [Paused] lisinopril-hydrochlorothiazide (PRINZIDE,ZESTORETIC) 20-25 MG per tablet Take 1 tablet by mouth daily. (Patient not taking: Reported on 01/17/2024)     No current facility-administered medications for this encounter.    ECOG PERFORMANCE STATUS:  1 - Symptomatic but completely ambulatory  REVIEW OF SYSTEMS: Patient denies any weight loss, fatigue, weakness, fever, chills or night sweats. Patient denies any loss of vision, blurred vision. Patient denies any ringing  of the ears or hearing loss. No irregular heartbeat. Patient denies heart murmur or history of fainting. Patient denies any chest pain or pain radiating to her upper extremities. Patient denies any shortness of breath, difficulty breathing at night, cough or hemoptysis. Patient denies any swelling in the lower legs. Patient denies any nausea vomiting, vomiting of blood, or coffee ground material  in the vomitus. Patient denies any stomach pain. Patient states has had normal bowel movements no significant constipation or diarrhea. Patient denies any dysuria, hematuria or significant nocturia. Patient denies any problems walking, swelling in the joints or loss of balance. Patient denies any skin changes, loss of hair or loss of weight. Patient denies any excessive worrying or anxiety or significant depression. Patient denies any problems with insomnia. Patient denies excessive thirst, polyuria, polydipsia. Patient denies any swollen glands, patient denies easy bruising or easy bleeding. Patient denies any recent infections, allergies or URI. Patient "s visual fields have not changed significantly in recent time.   PHYSICAL EXAM: BP 108/74   Pulse 94   Temp 97.7 F (36.5 C) (Tympanic)   Resp 16   LMP 12/24/1998 Comment: prior to her hysterectomy Frail elderly appearing female in NAD.  She is on continuous nasal oxygen 2 L.  Well-developed well-nourished patient in NAD. HEENT reveals PERLA, EOMI, discs not visualized.  Oral cavity is clear. No oral mucosal lesions are identified. Neck is clear without evidence of cervical or supraclavicular adenopathy. Lungs are clear to A&P. Cardiac examination is essentially unremarkable with regular rate and rhythm without murmur rub or thrill. Abdomen is benign with no organomegaly or masses noted. Motor sensory and DTR levels are equal and symmetric in the upper and lower extremities. Cranial nerves II through XII are grossly intact. Proprioception is intact. No peripheral adenopathy or edema is identified. No motor or sensory levels are noted. Crude visual fields are within normal range.  LABORATORY DATA: Pathology reports reviewed    RADIOLOGY RESULTS: CT scans of chest PET scan reviewed MRI of brain pending   IMPRESSION: Stage IIIa non-small cell lung cancer of the right upper lobe in 68 year old female with multiple medical comorbidities  PLAN: At  this time I have recommended concurrent chemoradiation therapy.  Would plan on delivering 9 Gray over 6 weeks to her right upper lobe lesion the satellite lesion as well as her right hilar adenopathy.  I would use IMRT treatment planning and delivery based on stage III disease to spare critical structures such as her heart normal lung volume esophagus and spinal cord.  Risks and benefits of treatment including worsening of her cough fatigue alteration blood counts possible radiation esophagitis skin reaction all were reviewed in detail with the patient.  I personally set up and ordered CT simulation for later this week.  Will coordinate  chemotherapy with medical oncology.  There will be extra effort by both professional staff as well as technical staff to coordinate and manage concurrent chemoradiation and ensuing side effects during her treatments.  Patient comprehends my recommendations well.  I would like to take this opportunity to thank you for allowing me to participate in the care of your patient.Carmina Miller, MD

## 2024-01-17 NOTE — Progress Notes (Signed)
 Patient for IR Port Insertion on Tues 01/18/24, I called and spoke with the Lisa/Trish, RN at [facility] on the phone and gave pre-procedure instructions. Lisa/Trish, RN was made aware to have the patient here at 9a, NPO after MN prior to procedure as well as driver post procedure/recovery/discharge. Lisa/Trish, RN stated understanding.  Called 01/17/24

## 2024-01-18 ENCOUNTER — Encounter: Payer: Self-pay | Admitting: Radiology

## 2024-01-18 ENCOUNTER — Encounter: Payer: Self-pay | Admitting: *Deleted

## 2024-01-18 ENCOUNTER — Encounter: Payer: Self-pay | Admitting: Internal Medicine

## 2024-01-18 ENCOUNTER — Ambulatory Visit
Admission: RE | Admit: 2024-01-18 | Discharge: 2024-01-18 | Disposition: A | Discharge status: Home / Self Care | Source: Ambulatory Visit | Attending: Internal Medicine | Admitting: Internal Medicine

## 2024-01-18 ENCOUNTER — Other Ambulatory Visit: Payer: Self-pay

## 2024-01-18 DIAGNOSIS — Z79899 Other long term (current) drug therapy: Secondary | ICD-10-CM | POA: Insufficient documentation

## 2024-01-18 DIAGNOSIS — I10 Essential (primary) hypertension: Secondary | ICD-10-CM | POA: Insufficient documentation

## 2024-01-18 DIAGNOSIS — J432 Centrilobular emphysema: Secondary | ICD-10-CM | POA: Insufficient documentation

## 2024-01-18 DIAGNOSIS — Z7984 Long term (current) use of oral hypoglycemic drugs: Secondary | ICD-10-CM | POA: Diagnosis not present

## 2024-01-18 DIAGNOSIS — C3490 Malignant neoplasm of unspecified part of unspecified bronchus or lung: Secondary | ICD-10-CM

## 2024-01-18 DIAGNOSIS — K219 Gastro-esophageal reflux disease without esophagitis: Secondary | ICD-10-CM | POA: Diagnosis not present

## 2024-01-18 DIAGNOSIS — E785 Hyperlipidemia, unspecified: Secondary | ICD-10-CM | POA: Diagnosis not present

## 2024-01-18 DIAGNOSIS — E11649 Type 2 diabetes mellitus with hypoglycemia without coma: Secondary | ICD-10-CM

## 2024-01-18 DIAGNOSIS — Z87891 Personal history of nicotine dependence: Secondary | ICD-10-CM | POA: Diagnosis not present

## 2024-01-18 DIAGNOSIS — E119 Type 2 diabetes mellitus without complications: Secondary | ICD-10-CM | POA: Diagnosis not present

## 2024-01-18 DIAGNOSIS — C3411 Malignant neoplasm of upper lobe, right bronchus or lung: Secondary | ICD-10-CM | POA: Diagnosis present

## 2024-01-18 DIAGNOSIS — Z794 Long term (current) use of insulin: Secondary | ICD-10-CM | POA: Diagnosis not present

## 2024-01-18 HISTORY — PX: IR IMAGING GUIDED PORT INSERTION: IMG5740

## 2024-01-18 LAB — GLUCOSE, CAPILLARY: Glucose-Capillary: 89 mg/dL (ref 70–99)

## 2024-01-18 MED ORDER — LIDOCAINE HCL 1 % IJ SOLN
INTRAMUSCULAR | Status: AC
Start: 2024-01-18 — End: ?
  Filled 2024-01-18: qty 20

## 2024-01-18 MED ORDER — HEPARIN SOD (PORK) LOCK FLUSH 100 UNIT/ML IV SOLN
INTRAVENOUS | Status: AC
  Filled 2024-01-18: qty 5

## 2024-01-18 MED ORDER — FENTANYL CITRATE (PF) 100 MCG/2ML IJ SOLN
INTRAMUSCULAR | Status: AC
  Filled 2024-01-18: qty 2

## 2024-01-18 MED ORDER — FENTANYL CITRATE (PF) 100 MCG/2ML IJ SOLN
INTRAMUSCULAR | Status: DC | PRN
  Administered 2024-01-18: 50 ug via INTRAVENOUS

## 2024-01-18 MED ORDER — MIDAZOLAM HCL 2 MG/2ML IJ SOLN
INTRAMUSCULAR | Status: DC | PRN
  Administered 2024-01-18: .5 mg via INTRAVENOUS

## 2024-01-18 MED ORDER — MIDAZOLAM HCL 2 MG/2ML IJ SOLN
INTRAMUSCULAR | Status: AC
  Filled 2024-01-18: qty 2

## 2024-01-18 MED ORDER — MIDAZOLAM HCL 5 MG/5ML IJ SOLN
INTRAMUSCULAR | Status: DC | PRN
  Administered 2024-01-18: .5 mg via INTRAVENOUS

## 2024-01-18 MED ORDER — SODIUM CHLORIDE 0.9 % IV SOLN: INTRAVENOUS | Status: DC

## 2024-01-18 MED ORDER — LIDOCAINE HCL 1 % IJ SOLN
10.0000 mL | Freq: Once | INTRAMUSCULAR | Status: AC
  Administered 2024-01-18: 10 mL via INTRADERMAL

## 2024-01-18 MED ORDER — HEPARIN SOD (PORK) LOCK FLUSH 100 UNIT/ML IV SOLN
500.0000 [IU] | Freq: Once | INTRAVENOUS | Status: AC
  Administered 2024-01-18: 500 [IU] via INTRAVENOUS

## 2024-01-18 NOTE — Addendum Note (Signed)
 Addended byMichaelyn Barter on: 01/18/2024 02:19 PM   Modules accepted: Orders

## 2024-01-18 NOTE — Progress Notes (Signed)
 Patient clinically stable post IR port placement per Dr Fredia Sorrow , tolerated well. Vitals stable post procedure. Received Versed 1 mg along with Fentanyl 50 mcg IV for procedure. Report given to [facility] post procedure.

## 2024-01-18 NOTE — Procedures (Signed)
 Interventional Radiology Procedure Note  Procedure: Single Lumen Power Port Placement    Access:  Left IJ vein.  Findings: Catheter tip positioned at SVC/RA junction. Port is ready for immediate use.   Complications: None  EBL: < 10 mL  Recommendations:  - Ok to shower in 24 hours - Do not submerge for 7 days - Routine line care   Hartleigh Edmonston T. Fredia Sorrow, M.D Pager:  [phone]

## 2024-01-18 NOTE — Telephone Encounter (Signed)
 Sims being planned for 3/19. Once treatment start dates for radiation are known, we can schedule her for chemotherapy.

## 2024-01-19 ENCOUNTER — Ambulatory Visit
Admission: RE | Admit: 2024-01-19 | Discharge: 2024-01-19 | Disposition: A | Discharge status: Home / Self Care | Source: Ambulatory Visit | Attending: Radiation Oncology | Admitting: Radiation Oncology

## 2024-01-19 ENCOUNTER — Other Ambulatory Visit: Payer: Self-pay

## 2024-01-19 ENCOUNTER — Encounter: Payer: Self-pay | Admitting: *Deleted

## 2024-01-19 DIAGNOSIS — C3411 Malignant neoplasm of upper lobe, right bronchus or lung: Secondary | ICD-10-CM | POA: Diagnosis not present

## 2024-01-19 NOTE — Progress Notes (Signed)
 Met with patient during CT simulation appt. Pt did not have any questions or needs at this time. Pt given print out of appts with new orders for prescriptions needed for chemotherapy treatments. EMLA cream instructions included as well for [facility] to assist patient. Called and spoke with pt's daughter, Gunnar Fusi, to inform of treatment dates. Pt's daughter stated that she is planning on being with patient during chemo treatments. Instructed Gunnar Fusi to call back with any questions or needs. Gunnar Fusi verbalized understanding.

## 2024-01-22 DIAGNOSIS — C3411 Malignant neoplasm of upper lobe, right bronchus or lung: Secondary | ICD-10-CM | POA: Diagnosis not present

## 2024-01-24 ENCOUNTER — Encounter: Payer: Self-pay | Admitting: Internal Medicine

## 2024-01-25 ENCOUNTER — Encounter: Payer: Self-pay | Admitting: Internal Medicine

## 2024-01-28 NOTE — Progress Notes (Signed)
 Pharmacist Chemotherapy Monitoring - Initial Assessment    Anticipated start date: 02/02/24   The following has been reviewed per standard work regarding the patient's treatment regimen: The patient's diagnosis, treatment plan and drug doses, and organ/hematologic function Lab orders and baseline tests specific to treatment regimen  The treatment plan start date, drug sequencing, and pre-medications Prior authorization status  Patient's documented medication list, including drug-drug interaction screen and prescriptions for anti-emetics and supportive care specific to the treatment regimen The drug concentrations, fluid compatibility, administration routes, and timing of the medications to be used The patient's access for treatment and lifetime cumulative dose history, if applicable  The patient's medication allergies and previous infusion related reactions, if applicable   Changes made to treatment plan:  N/A  Follow up needed:  N/A   Ebony Hail, Pharm.D., CPP 01/28/2024@12 :26 PM

## 2024-01-31 ENCOUNTER — Ambulatory Visit

## 2024-01-31 ENCOUNTER — Other Ambulatory Visit: Payer: Self-pay

## 2024-01-31 ENCOUNTER — Ambulatory Visit
Admission: RE | Admit: 2024-01-31 | Discharge: 2024-01-31 | Disposition: A | Discharge status: Home / Self Care | Source: Ambulatory Visit | Attending: Radiation Oncology | Admitting: Radiation Oncology

## 2024-01-31 DIAGNOSIS — C3411 Malignant neoplasm of upper lobe, right bronchus or lung: Secondary | ICD-10-CM | POA: Diagnosis not present

## 2024-01-31 LAB — RAD ONC ARIA SESSION SUMMARY
Course Elapsed Days: 0
Plan Fractions Treated to Date: 1
Plan Prescribed Dose Per Fraction: 2 Gy
Plan Total Fractions Prescribed: 33
Plan Total Prescribed Dose: 66 Gy
Reference Point Dosage Given to Date: 2 Gy
Reference Point Session Dosage Given: 2 Gy
Session Number: 1

## 2024-02-01 ENCOUNTER — Other Ambulatory Visit: Payer: Self-pay

## 2024-02-01 ENCOUNTER — Telehealth: Payer: Self-pay | Admitting: *Deleted

## 2024-02-01 ENCOUNTER — Encounter: Payer: Self-pay | Admitting: Internal Medicine

## 2024-02-01 ENCOUNTER — Ambulatory Visit
Admission: RE | Admit: 2024-02-01 | Discharge: 2024-02-01 | Disposition: A | Discharge status: Home / Self Care | Source: Ambulatory Visit | Attending: Radiation Oncology | Admitting: Radiation Oncology

## 2024-02-01 DIAGNOSIS — Z7984 Long term (current) use of oral hypoglycemic drugs: Secondary | ICD-10-CM | POA: Insufficient documentation

## 2024-02-01 DIAGNOSIS — I509 Heart failure, unspecified: Secondary | ICD-10-CM | POA: Diagnosis not present

## 2024-02-01 DIAGNOSIS — Z794 Long term (current) use of insulin: Secondary | ICD-10-CM | POA: Diagnosis not present

## 2024-02-01 DIAGNOSIS — R200 Anesthesia of skin: Secondary | ICD-10-CM | POA: Insufficient documentation

## 2024-02-01 DIAGNOSIS — Z9981 Dependence on supplemental oxygen: Secondary | ICD-10-CM | POA: Insufficient documentation

## 2024-02-01 DIAGNOSIS — E119 Type 2 diabetes mellitus without complications: Secondary | ICD-10-CM | POA: Diagnosis not present

## 2024-02-01 DIAGNOSIS — J441 Chronic obstructive pulmonary disease with (acute) exacerbation: Secondary | ICD-10-CM | POA: Diagnosis not present

## 2024-02-01 DIAGNOSIS — J449 Chronic obstructive pulmonary disease, unspecified: Secondary | ICD-10-CM | POA: Diagnosis not present

## 2024-02-01 DIAGNOSIS — Z5111 Encounter for antineoplastic chemotherapy: Secondary | ICD-10-CM | POA: Insufficient documentation

## 2024-02-01 DIAGNOSIS — R531 Weakness: Secondary | ICD-10-CM | POA: Insufficient documentation

## 2024-02-01 DIAGNOSIS — Z923 Personal history of irradiation: Secondary | ICD-10-CM | POA: Insufficient documentation

## 2024-02-01 DIAGNOSIS — C3411 Malignant neoplasm of upper lobe, right bronchus or lung: Secondary | ICD-10-CM | POA: Insufficient documentation

## 2024-02-01 DIAGNOSIS — I110 Hypertensive heart disease with heart failure: Secondary | ICD-10-CM | POA: Diagnosis not present

## 2024-02-01 DIAGNOSIS — Z7982 Long term (current) use of aspirin: Secondary | ICD-10-CM | POA: Insufficient documentation

## 2024-02-01 DIAGNOSIS — G9340 Encephalopathy, unspecified: Secondary | ICD-10-CM | POA: Diagnosis not present

## 2024-02-01 DIAGNOSIS — Z79899 Other long term (current) drug therapy: Secondary | ICD-10-CM | POA: Diagnosis not present

## 2024-02-01 DIAGNOSIS — K219 Gastro-esophageal reflux disease without esophagitis: Secondary | ICD-10-CM | POA: Diagnosis not present

## 2024-02-01 DIAGNOSIS — Z510 Encounter for antineoplastic radiation therapy: Secondary | ICD-10-CM | POA: Insufficient documentation

## 2024-02-01 DIAGNOSIS — I10 Essential (primary) hypertension: Secondary | ICD-10-CM | POA: Insufficient documentation

## 2024-02-01 DIAGNOSIS — Z87891 Personal history of nicotine dependence: Secondary | ICD-10-CM | POA: Insufficient documentation

## 2024-02-01 DIAGNOSIS — Z7952 Long term (current) use of systemic steroids: Secondary | ICD-10-CM | POA: Diagnosis not present

## 2024-02-01 DIAGNOSIS — R197 Diarrhea, unspecified: Secondary | ICD-10-CM | POA: Insufficient documentation

## 2024-02-01 DIAGNOSIS — Z8719 Personal history of other diseases of the digestive system: Secondary | ICD-10-CM | POA: Insufficient documentation

## 2024-02-01 DIAGNOSIS — Z7951 Long term (current) use of inhaled steroids: Secondary | ICD-10-CM | POA: Insufficient documentation

## 2024-02-01 DIAGNOSIS — Z803 Family history of malignant neoplasm of breast: Secondary | ICD-10-CM | POA: Diagnosis not present

## 2024-02-01 DIAGNOSIS — I69351 Hemiplegia and hemiparesis following cerebral infarction affecting right dominant side: Secondary | ICD-10-CM | POA: Insufficient documentation

## 2024-02-01 DIAGNOSIS — D509 Iron deficiency anemia, unspecified: Secondary | ICD-10-CM | POA: Diagnosis not present

## 2024-02-01 DIAGNOSIS — Z86718 Personal history of other venous thrombosis and embolism: Secondary | ICD-10-CM | POA: Diagnosis not present

## 2024-02-01 DIAGNOSIS — E785 Hyperlipidemia, unspecified: Secondary | ICD-10-CM | POA: Insufficient documentation

## 2024-02-01 DIAGNOSIS — Z791 Long term (current) use of non-steroidal anti-inflammatories (NSAID): Secondary | ICD-10-CM | POA: Diagnosis not present

## 2024-02-01 DIAGNOSIS — Z79890 Hormone replacement therapy (postmenopausal): Secondary | ICD-10-CM | POA: Insufficient documentation

## 2024-02-01 LAB — RAD ONC ARIA SESSION SUMMARY
Course Elapsed Days: 1
Plan Fractions Treated to Date: 2
Plan Prescribed Dose Per Fraction: 2 Gy
Plan Total Fractions Prescribed: 33
Plan Total Prescribed Dose: 66 Gy
Reference Point Dosage Given to Date: 4 Gy
Reference Point Session Dosage Given: 2 Gy
Session Number: 2

## 2024-02-01 NOTE — Telephone Encounter (Signed)
 The pt. Getting chemo every 7 days and the radiation is every day M-F. So she should have the meds and demographics 4/7 and every Fridays'.

## 2024-02-02 ENCOUNTER — Other Ambulatory Visit: Payer: Self-pay

## 2024-02-02 ENCOUNTER — Ambulatory Visit
Admission: RE | Admit: 2024-02-02 | Discharge: 2024-02-02 | Disposition: A | Discharge status: Home / Self Care | Source: Ambulatory Visit | Attending: Radiation Oncology | Admitting: Radiation Oncology

## 2024-02-02 DIAGNOSIS — Z510 Encounter for antineoplastic radiation therapy: Secondary | ICD-10-CM | POA: Diagnosis not present

## 2024-02-02 LAB — RAD ONC ARIA SESSION SUMMARY
Course Elapsed Days: 2
Plan Fractions Treated to Date: 3
Plan Prescribed Dose Per Fraction: 2 Gy
Plan Total Fractions Prescribed: 33
Plan Total Prescribed Dose: 66 Gy
Reference Point Dosage Given to Date: 6 Gy
Reference Point Session Dosage Given: 2 Gy
Session Number: 3

## 2024-02-03 ENCOUNTER — Ambulatory Visit
Admission: RE | Admit: 2024-02-03 | Discharge: 2024-02-03 | Disposition: A | Discharge status: Home / Self Care | Source: Ambulatory Visit | Attending: Radiation Oncology | Admitting: Radiation Oncology

## 2024-02-03 ENCOUNTER — Other Ambulatory Visit: Payer: Self-pay

## 2024-02-03 DIAGNOSIS — Z510 Encounter for antineoplastic radiation therapy: Secondary | ICD-10-CM | POA: Diagnosis not present

## 2024-02-03 LAB — MISCELLANEOUS TEST

## 2024-02-03 LAB — RAD ONC ARIA SESSION SUMMARY
Course Elapsed Days: 3
Plan Fractions Treated to Date: 4
Plan Prescribed Dose Per Fraction: 2 Gy
Plan Total Fractions Prescribed: 33
Plan Total Prescribed Dose: 66 Gy
Reference Point Dosage Given to Date: 8 Gy
Reference Point Session Dosage Given: 2 Gy
Session Number: 4

## 2024-02-04 ENCOUNTER — Inpatient Hospital Stay

## 2024-02-04 ENCOUNTER — Encounter: Payer: Self-pay | Admitting: Internal Medicine

## 2024-02-04 ENCOUNTER — Encounter: Payer: Self-pay | Admitting: *Deleted

## 2024-02-04 ENCOUNTER — Ambulatory Visit
Admission: RE | Admit: 2024-02-04 | Discharge: 2024-02-04 | Disposition: A | Discharge status: Home / Self Care | Source: Ambulatory Visit | Attending: Radiation Oncology | Admitting: Radiation Oncology

## 2024-02-04 ENCOUNTER — Other Ambulatory Visit: Payer: Self-pay

## 2024-02-04 ENCOUNTER — Inpatient Hospital Stay (HOSPITAL_BASED_OUTPATIENT_CLINIC_OR_DEPARTMENT_OTHER): Admitting: Internal Medicine

## 2024-02-04 VITALS — BP 142/69 | HR 89 | Resp 20

## 2024-02-04 VITALS — BP 155/75 | HR 90 | Temp 97.8°F | Wt 170.0 lb

## 2024-02-04 DIAGNOSIS — C3411 Malignant neoplasm of upper lobe, right bronchus or lung: Secondary | ICD-10-CM

## 2024-02-04 DIAGNOSIS — Z791 Long term (current) use of non-steroidal anti-inflammatories (NSAID): Secondary | ICD-10-CM | POA: Insufficient documentation

## 2024-02-04 DIAGNOSIS — Z7984 Long term (current) use of oral hypoglycemic drugs: Secondary | ICD-10-CM | POA: Insufficient documentation

## 2024-02-04 DIAGNOSIS — J441 Chronic obstructive pulmonary disease with (acute) exacerbation: Secondary | ICD-10-CM | POA: Insufficient documentation

## 2024-02-04 DIAGNOSIS — G9340 Encephalopathy, unspecified: Secondary | ICD-10-CM | POA: Insufficient documentation

## 2024-02-04 DIAGNOSIS — Z7951 Long term (current) use of inhaled steroids: Secondary | ICD-10-CM | POA: Insufficient documentation

## 2024-02-04 DIAGNOSIS — E785 Hyperlipidemia, unspecified: Secondary | ICD-10-CM | POA: Insufficient documentation

## 2024-02-04 DIAGNOSIS — Z79890 Hormone replacement therapy (postmenopausal): Secondary | ICD-10-CM | POA: Insufficient documentation

## 2024-02-04 DIAGNOSIS — K219 Gastro-esophageal reflux disease without esophagitis: Secondary | ICD-10-CM | POA: Insufficient documentation

## 2024-02-04 DIAGNOSIS — Z794 Long term (current) use of insulin: Secondary | ICD-10-CM | POA: Insufficient documentation

## 2024-02-04 DIAGNOSIS — Z86718 Personal history of other venous thrombosis and embolism: Secondary | ICD-10-CM | POA: Insufficient documentation

## 2024-02-04 DIAGNOSIS — E119 Type 2 diabetes mellitus without complications: Secondary | ICD-10-CM | POA: Insufficient documentation

## 2024-02-04 DIAGNOSIS — Z7982 Long term (current) use of aspirin: Secondary | ICD-10-CM | POA: Insufficient documentation

## 2024-02-04 DIAGNOSIS — Z923 Personal history of irradiation: Secondary | ICD-10-CM | POA: Insufficient documentation

## 2024-02-04 DIAGNOSIS — Z8719 Personal history of other diseases of the digestive system: Secondary | ICD-10-CM | POA: Insufficient documentation

## 2024-02-04 DIAGNOSIS — I69351 Hemiplegia and hemiparesis following cerebral infarction affecting right dominant side: Secondary | ICD-10-CM | POA: Insufficient documentation

## 2024-02-04 DIAGNOSIS — Z803 Family history of malignant neoplasm of breast: Secondary | ICD-10-CM | POA: Insufficient documentation

## 2024-02-04 DIAGNOSIS — D509 Iron deficiency anemia, unspecified: Secondary | ICD-10-CM | POA: Insufficient documentation

## 2024-02-04 DIAGNOSIS — Z5111 Encounter for antineoplastic chemotherapy: Secondary | ICD-10-CM | POA: Insufficient documentation

## 2024-02-04 DIAGNOSIS — I110 Hypertensive heart disease with heart failure: Secondary | ICD-10-CM | POA: Insufficient documentation

## 2024-02-04 DIAGNOSIS — R200 Anesthesia of skin: Secondary | ICD-10-CM | POA: Insufficient documentation

## 2024-02-04 DIAGNOSIS — Z79899 Other long term (current) drug therapy: Secondary | ICD-10-CM | POA: Insufficient documentation

## 2024-02-04 DIAGNOSIS — R531 Weakness: Secondary | ICD-10-CM | POA: Insufficient documentation

## 2024-02-04 DIAGNOSIS — I509 Heart failure, unspecified: Secondary | ICD-10-CM | POA: Insufficient documentation

## 2024-02-04 DIAGNOSIS — Z510 Encounter for antineoplastic radiation therapy: Secondary | ICD-10-CM | POA: Diagnosis not present

## 2024-02-04 DIAGNOSIS — Z9981 Dependence on supplemental oxygen: Secondary | ICD-10-CM | POA: Insufficient documentation

## 2024-02-04 DIAGNOSIS — Z87891 Personal history of nicotine dependence: Secondary | ICD-10-CM | POA: Insufficient documentation

## 2024-02-04 DIAGNOSIS — Z7952 Long term (current) use of systemic steroids: Secondary | ICD-10-CM | POA: Insufficient documentation

## 2024-02-04 LAB — CMP (CANCER CENTER ONLY)
ALT: 15 U/L (ref 0–44)
AST: 17 U/L (ref 15–41)
Albumin: 3.4 g/dL — ABNORMAL LOW (ref 3.5–5.0)
Alkaline Phosphatase: 93 U/L (ref 38–126)
Anion gap: 7 (ref 5–15)
BUN: 14 mg/dL (ref 8–23)
CO2: 32 mmol/L (ref 22–32)
Calcium: 9.5 mg/dL (ref 8.9–10.3)
Chloride: 101 mmol/L (ref 98–111)
Creatinine: 0.51 mg/dL (ref 0.44–1.00)
GFR, Estimated: >60 mL/min (ref >60)
Glucose, Bld: 136 mg/dL — ABNORMAL HIGH (ref 70–99)
Potassium: 3.6 mmol/L (ref 3.5–5.1)
Sodium: 140 mmol/L (ref 135–145)
Total Bilirubin: 0.4 mg/dL (ref 0.0–1.2)
Total Protein: 6.7 g/dL (ref 6.5–8.1)

## 2024-02-04 LAB — CBC WITH DIFFERENTIAL (CANCER CENTER ONLY)
Abs Immature Granulocytes: 0.03 10*3/uL (ref 0.00–0.07)
Basophils Absolute: 0 10*3/uL (ref 0.0–0.1)
Basophils Relative: 0 %
Eosinophils Absolute: 0 10*3/uL (ref 0.0–0.5)
Eosinophils Relative: 0 %
HCT: 33.4 % — ABNORMAL LOW (ref 36.0–46.0)
Hemoglobin: 10.4 g/dL — ABNORMAL LOW (ref 12.0–15.0)
Immature Granulocytes: 1 %
Lymphocytes Relative: 13 %
Lymphs Abs: 0.7 10*3/uL (ref 0.7–4.0)
MCH: 24.7 pg — ABNORMAL LOW (ref 26.0–34.0)
MCHC: 31.1 g/dL (ref 30.0–36.0)
MCV: 79.3 fL — ABNORMAL LOW (ref 80.0–100.0)
Monocytes Absolute: 0.5 10*3/uL (ref 0.1–1.0)
Monocytes Relative: 9 %
Neutro Abs: 4.3 10*3/uL (ref 1.7–7.7)
Neutrophils Relative %: 77 %
Platelet Count: 211 10*3/uL (ref 150–400)
RBC: 4.21 MIL/uL (ref 3.87–5.11)
RDW: 14.5 % (ref 11.5–15.5)
WBC Count: 5.5 10*3/uL (ref 4.0–10.5)
nRBC: 0 % (ref 0.0–0.2)

## 2024-02-04 LAB — RAD ONC ARIA SESSION SUMMARY
Course Elapsed Days: 4
Plan Fractions Treated to Date: 5
Plan Prescribed Dose Per Fraction: 2 Gy
Plan Total Fractions Prescribed: 33
Plan Total Prescribed Dose: 66 Gy
Reference Point Dosage Given to Date: 10 Gy
Reference Point Session Dosage Given: 2 Gy
Session Number: 5

## 2024-02-04 MED ORDER — PALONOSETRON HCL INJECTION 0.25 MG/5ML
0.2500 mg | Freq: Once | INTRAVENOUS | Status: AC
  Administered 2024-02-04: 0.25 mg via INTRAVENOUS
  Filled 2024-02-04: qty 5

## 2024-02-04 MED ORDER — DEXAMETHASONE SODIUM PHOSPHATE 10 MG/ML IJ SOLN
10.0000 mg | Freq: Once | INTRAMUSCULAR | Status: AC
  Administered 2024-02-04: 10 mg via INTRAVENOUS
  Filled 2024-02-04: qty 1

## 2024-02-04 MED ORDER — FAMOTIDINE IN NACL 20-0.9 MG/50ML-% IV SOLN
20.0000 mg | Freq: Once | INTRAVENOUS | Status: AC
Start: 2024-02-04 — End: 2024-02-04
  Administered 2024-02-04: 20 mg via INTRAVENOUS
  Filled 2024-02-04: qty 50

## 2024-02-04 MED ORDER — HEPARIN SOD (PORK) LOCK FLUSH 100 UNIT/ML IV SOLN
500.0000 [IU] | Freq: Once | INTRAVENOUS | Status: DC | PRN
  Filled 2024-02-04: qty 5

## 2024-02-04 MED ORDER — SODIUM CHLORIDE 0.9 % IV SOLN
INTRAVENOUS | Status: DC
  Filled 2024-02-04: qty 250

## 2024-02-04 MED ORDER — DIPHENHYDRAMINE HCL 50 MG/ML IJ SOLN
50.0000 mg | Freq: Once | INTRAMUSCULAR | Status: AC
  Administered 2024-02-04: 50 mg via INTRAVENOUS
  Filled 2024-02-04: qty 1

## 2024-02-04 MED ORDER — SODIUM CHLORIDE 0.9 % IV SOLN
45.0000 mg/m2 | Freq: Once | INTRAVENOUS | Status: AC
  Administered 2024-02-04: 84 mg via INTRAVENOUS
  Filled 2024-02-04: qty 14

## 2024-02-04 MED ORDER — SODIUM CHLORIDE 0.9 % IV SOLN
178.2000 mg | Freq: Once | INTRAVENOUS | Status: AC
  Administered 2024-02-04: 180 mg via INTRAVENOUS
  Filled 2024-02-04: qty 18

## 2024-02-04 NOTE — Patient Instructions (Signed)

## 2024-02-04 NOTE — Progress Notes (Signed)
 Met with patient and daughter during infusion today. Pt and daughter did not have any questions during visit. Nothing further needed at this time. Instructed to call with any questions or needs. Pt and daughter verbalized understanding.

## 2024-02-04 NOTE — Progress Notes (Signed)
 [facility] CONSULT NOTE  Patient Care Team: Center, [facility] as PCP - General (General Practice) Jim Like, RN as Registered Nurse Scarlett Presto, RN (Inactive) as Registered Nurse Michaelyn Barter, MD as Consulting Physician (Oncology) Glory Buff, RN as Oncology Nurse Navigator Carmina Miller, MD as Consulting Physician (Radiation Oncology)  Reason for visit- right upper lobe lung non-small cell cancer.  CANCER STAGING   Cancer Staging  Cancer of upper lobe of right lung [facility]) Staging form: Lung, AJCC V9 - Clinical stage from 11/12/2023: Stage IIIA (cT3, cN1, cM0) - Signed by Michaelyn Barter, MD on 01/07/2024   ASSESSMENT & PLAN:  Michele House 68 y.o. 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  # RUL NSCLC, Stage IIIA - Status post EBUS with biopsy with Dr.Aleskerov.  Right upper lobe lung biopsy showed non-small cell carcinoma.  Coexpress is TTF-1 and p40.  With TTF-1 expression, could be adenocarcinoma.  Insufficient material for ancillary molecular testing. RUL bronchial lavage positive for malignancy. Station 7 lymph node negative for malignancy.  -PET scan from April 22, 2024 showed 1.8 cm nodule in the right upper lobe with SUV of 37.5.  6 mm nodule more superiorly hypermetabolic for size could be satellite lesion.  There are several small hypermetabolic right hilar lymph nodes largest with SUV of 16.  No evidence of metastatic disease otherwise.  -Started on radiation on 01/31/2024.  Labs reviewed and acceptable for treatment.  Will proceed with cycle 1 of carboplatin AUC 2 and Taxol 45 mg/m2.  Instructions on use of dexamethasone, Zofran and Compazine [facility].  Side effects of the chemotherapy were rediscussed with the patient and the daughter.  -MRI brain with and  without contrast results pending. -Liquid Tempus testing sent.  Result pending.  # COPD on 2 L oxygen -Continue follow-up with pulmonary  # Iron deficiency anemia -Hemoglobin 10.6.  Ferritin 37 and saturation 10%. -Schedule for IV Venofer 200 mg weekly x 5 doses.  # History of GI bleed -s/p EGD which showed bleeding duodenal ulcer with clip placed 10/28/2023.  - s/p IR embolization on 10/30/2023.  Plavix was discontinued.  She was continued on baby aspirin.  # History of DVT - Had bilateral lower extremity Dopplers for leg pain.  Showed right calf DVT.   - Status post IVC filter on 11/09/2023.   Orders Placed This Encounter  Procedures   CBC with Differential (Cancer Center Only)    Standing Status:   Future    Expected Date:   02/11/2024    Expiration Date:   02/10/2025   CMP (Cancer Center only)    Standing Status:   Future    Expected Date:   02/11/2024    Expiration Date:   02/10/2025   CBC with Differential (Cancer Center Only)    Standing Status:   Future    Expected Date:   02/18/2024    Expiration Date:   02/17/2025   CMP (Cancer Center only)    Standing Status:   Future    Expected Date:   02/18/2024    Expiration Date:   02/17/2025   CBC with Differential (Cancer Center Only)    Standing Status:   Future    Expected Date:   02/25/2024    Expiration Date:   02/24/2025   CMP (Cancer Center only)  Standing Status:   Future    Expected Date:   02/25/2024    Expiration Date:   02/24/2025    The total time spent in the appointment was 30 minutes encounter with patients including review of chart and various tests results, discussions about plan of care and coordination of care plan   All questions were answered. The patient knows to call the clinic with any problems, questions or concerns. No barriers to learning was detected.  Michaelyn Barter, MD 4/4/202512:45 PM   HISTORY OF PRESENTING ILLNESS:  Michele House 68 y.o. 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  Patient had long complicated hospital course from 10/02/2023 to 11/15/2023.  Presented with COPD exacerbation, pneumonia, UTI and encephalopathy.  Requiring intubation.   CTA chest from 09/24/2023 showed increase in size of spiculated pulmonary nodule in the inferior right upper lobe 1.6 cm compared to 1.3 cm previously.  Initial lung biopsy on 10/20/2023 was nondiagnostic.    Repeat EBUS with biopsy with Dr.Aleskerov on 11/12/2023.  Right upper lobe lung biopsy showed non-small cell carcinoma.  Coexpress is TTF-1 and p40.  Insufficient material for ancillary molecular testing. RUL bronchial lavage positive for malignancy. Station 7 lymph node negative for malignancy.  Hospital course complicated by encephalopathy.  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  EEG showed diffuse slowing without seizures.  LP unremarkable.  Autoimmune/paraneoplastic panel sent.  Treated with 5 days of high-dose Solu-Medrol with not much improvement.  Also developed GI bleed causing hemorrhagic shock. s/p EGD which showed bleeding duodenal ulcer with clip placed 10/28/2023. s/p IR embolization on 10/30/2023.  Plavix was discontinued.  She was continued on baby aspirin.  Had bilateral lower extremity Dopplers for leg pain.  Showed right calf DVT.  Status post IVC filter on 11/09/2023.  Interval history Patient was seen today in the clinic accompanied with daughter.  On concurrent chemo RT for stage IIIa lung cancer. Doing well overall.  Appetite is good.  Has baseline numbness in her bilateral feet.  On 2 L oxygen at baseline.  Denies any worsening shortness of breath.  I have reviewed  her chart and materials related to her cancer extensively and collaborated history with the patient. Summary of oncologic history is as follows: Oncology History  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)  11/12/2023 Cancer Staging   Staging form: Lung, AJCC V9 - Clinical stage from 11/12/2023: Stage IIIA (cT3, cN1, cM0) - Signed by Michaelyn Barter, MD on 01/07/2024   12/17/2023 Initial Diagnosis   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)   02/04/2024 -  Chemotherapy   Patient is on Treatment Plan : LUNG Carboplatin + Paclitaxel + XRT q7d       MEDICAL HISTORY:  Past Medical History:  Diagnosis Date   Acid reflux    COPD (chronic obstructive pulmonary disease) (HCC)    Diabetes mellitus without complication (HCC)    Hyperlipidemia    Hypertension    Thyroid disease     SURGICAL HISTORY: Past Surgical History:  Procedure Laterality Date   appendectomy     APPENDECTOMY     BREAST BIOPSY Right    CORE W/CLIP - NEG   ESOPHAGOGASTRODUODENOSCOPY N/A 10/28/2023  Procedure: ESOPHAGOGASTRODUODENOSCOPY (EGD);  Surgeon: Midge Minium, MD;  Location: [facility] ENDOSCOPY;  Service: Endoscopy;  Laterality: N/A;   FLEXIBLE BRONCHOSCOPY Bilateral 11/12/2023   Procedure: FLEXIBLE BRONCHOSCOPY;  Surgeon: Vida Rigger, MD;  Location: [facility] ORS;  Service: Thoracic;  Laterality: Bilateral;   IR EMBO ART  VEN HEMORR LYMPH EXTRAV  INC GUIDE ROADMAPPING  10/30/2023   IR IMAGING GUIDED PORT INSERTION  01/18/2024   IVC FILTER INSERTION N/A 11/09/2023   Procedure: IVC FILTER INSERTION;  Surgeon: Annice Needy, MD;  Location: [facility] INVASIVE CV LAB;  Service: Cardiovascular;  Laterality: N/A;   TOTAL VAGINAL HYSTERECTOMY     TUMOR REMOVAL     benign;stomach   VIDEO BRONCHOSCOPY WITH ENDOBRONCHIAL NAVIGATION Right 10/20/2023   Procedure: VIDEO BRONCHOSCOPY WITH ENDOBRONCHIAL NAVIGATION;  Surgeon: Vida Rigger, MD;  Location: [facility] ORS;  Service: Thoracic;  Laterality: Right;   VIDEO BRONCHOSCOPY WITH  ENDOBRONCHIAL NAVIGATION Right 11/12/2023   Procedure: VIDEO BRONCHOSCOPY WITH ENDOBRONCHIAL NAVIGATION;  Surgeon: Vida Rigger, MD;  Location: [facility] ORS;  Service: Thoracic;  Laterality: Right;    SOCIAL HISTORY: Social History   Socioeconomic History   Marital status: Single    Spouse name: Not on file   Number of children: Not on file   Years of education: Not on file   Highest education level: Not on file  Occupational History   Not on file  Tobacco Use   Smoking status: Former    Current packs/day: 0.00    Types: Cigarettes    Quit date: 12/15/1985    Years since quitting: 38.1   Smokeless tobacco: Never  Substance and Sexual Activity   Alcohol use: No   Drug use: No   Sexual activity: Not Currently  Other Topics Concern   Not on file  Social History Narrative   Not on file   Social Drivers of Health   Financial Resource Strain: Not on file  Food Insecurity: Patient Unable To Answer (10/03/2023)   Hunger Vital Sign    Worried About Running Out of Food in the Last Year: Patient unable to answer    Ran Out of Food in the Last Year: Patient unable to answer  Recent Concern: Food Insecurity - Food Insecurity Present (09/24/2023)   Hunger Vital Sign    Worried About Running Out of Food in the Last Year: Sometimes true    Ran Out of Food in the Last Year: Sometimes true  Transportation Needs: Patient Unable To Answer (10/03/2023)   PRAPARE - Transportation    Lack of Transportation (Medical): Patient unable to answer    Lack of Transportation (Non-Medical): Patient unable to answer  Recent Concern: Transportation Needs - Unmet Transportation Needs (09/24/2023)   PRAPARE - Administrator, Civil Service (Medical): Yes    Lack of Transportation (Non-Medical): Yes  Physical Activity: Not on file  Stress: Not on file  Social Connections: Patient Declined (11/02/2023)   Social Connection and Isolation Panel [NHANES]    Frequency of Communication with Friends  and Family: Patient declined    Frequency of Social Gatherings with Friends and Family: Patient declined    Attends Religious Services: Patient declined    Database administrator or Organizations: Patient declined    Attends Banker Meetings: Patient declined    Marital Status: Patient declined  Intimate Partner Violence: Patient Unable To Answer (10/03/2023)   Humiliation, Afraid, Rape, and Kick questionnaire    Fear of Current or Ex-Partner: Patient unable to answer  Emotionally Abused: Patient unable to answer    Physically Abused: Patient unable to answer    Sexually Abused: Patient unable to answer    FAMILY HISTORY: Family History  Problem Relation Age of Onset   Heart attack Mother    Hypertension Mother    Breast cancer Sister 38   Breast cancer Maternal Aunt        40'S   Breast cancer Maternal Grandmother     ALLERGIES:  is allergic to penicillins, aspirin, and sulfa antibiotics.  MEDICATIONS:  Current Outpatient Medications  Medication Sig Dispense Refill   acetaminophen (TYLENOL) 325 MG tablet Take 650 mg by mouth every 6 (six) hours as needed.     aspirin EC 81 MG tablet Take 81 mg by mouth daily.     atorvastatin (LIPITOR) 20 MG tablet Take 20 mg by mouth daily.     calcium carbonate (TUMS - DOSED IN MG ELEMENTAL CALCIUM) 500 MG chewable tablet Chew 1 tablet by mouth daily.     cetirizine (ZYRTEC) 10 MG tablet Take 10 mg by mouth daily.     COMBIVENT RESPIMAT 20-100 MCG/ACT AERS respimat Inhale into the lungs every 6 (six) hours as needed for wheezing or shortness of breath.     dexamethasone (DECADRON) 4 MG tablet Take 2 tablets daily for 2 days, start the day after chemotherapy. Take with food. 30 tablet 1   dextromethorphan-guaiFENesin (MUCINEX DM) 30-600 MG 12hr tablet Take 1 tablet by mouth 2 (two) times daily as needed for cough. 30 tablet 0   diclofenac sodium (VOLTAREN) 1 % GEL Apply topically 4 (four) times daily.     docusate sodium  (COLACE) 100 MG capsule Take 1 capsule (100 mg total) by mouth 2 (two) times daily.     DULERA 200-5 MCG/ACT AERO Inhale 2 puffs into the lungs every 12 (twelve) hours.     furosemide (LASIX) 20 MG tablet Take 1 tablet (20 mg total) by mouth daily. 30 tablet 0   glipiZIDE (GLUCOTROL) 10 MG tablet Take 10 mg by mouth daily before breakfast.     insulin aspart (NOVOLOG) 100 UNIT/ML injection Inject 4 Units into the skin 3 (three) times daily with meals.     insulin glargine-yfgn (SEMGLEE) 100 UNIT/ML injection Inject 0.18 mLs (18 Units total) into the skin daily.     ipratropium (ATROVENT HFA) 17 MCG/ACT inhaler Inhale 2 puffs into the lungs every 6 (six) hours.     levETIRAcetam (KEPPRA) 500 MG tablet Take 1 tablet (500 mg total) by mouth 2 (two) times daily.     levothyroxine (SYNTHROID) 150 MCG tablet Take 1 tablet (150 mcg total) by mouth daily before breakfast. Increased from 100.     lidocaine-prilocaine (EMLA) cream Apply to affected area once 30 g 3   [Paused] lisinopril-hydrochlorothiazide (PRINZIDE,ZESTORETIC) 20-25 MG per tablet Take 1 tablet by mouth daily. (Patient not taking: Reported on 01/17/2024)     loperamide (IMODIUM A-D) 2 MG tablet Take 2 mg by mouth 4 (four) times daily as needed for diarrhea or loose stools.     LORazepam (ATIVAN) 0.5 MG tablet Take 0.5 mg by mouth every 8 (eight) hours as needed for anxiety.     Multiple Vitamin (MULTIVITAMIN) capsule Take 1 capsule by mouth daily.     naloxone (NARCAN) nasal spray 4 mg/0.1 mL Place 1 spray into the nose.     ondansetron (ZOFRAN) 4 MG/5ML solution Take by mouth once.     ondansetron (ZOFRAN) 8 MG tablet Take 1 tablet (  8 mg total) by mouth every 8 (eight) hours as needed for nausea or vomiting. Start on the third day after chemotherapy. 30 tablet 1   pantoprazole (PROTONIX) 40 MG tablet Take 1 tablet (40 mg total) by mouth 2 (two) times daily before a meal.     polyethylene glycol (MIRALAX / GLYCOLAX) 17 g packet Take 17 g by  mouth daily.     pregabalin (LYRICA) 50 MG capsule Take 50 mg by mouth 2 (two) times daily.     prochlorperazine (COMPAZINE) 10 MG tablet Take 1 tablet (10 mg total) by mouth every 6 (six) hours as needed for nausea or vomiting. 30 tablet 1   rOPINIRole (REQUIP XL) 4 MG 24 hr tablet Take 1 tablet (4 mg total) by mouth at bedtime.     sertraline (ZOLOFT) 100 MG tablet Take 100 mg by mouth daily.     TRADJENTA 5 MG TABS tablet Take 5 mg by mouth daily.     No current facility-administered medications for this visit.   Facility-Administered Medications Ordered in Other Visits  Medication Dose Route Frequency Provider Last Rate Last Admin   0.9 %  sodium chloride infusion   Intravenous Continuous Michaelyn Barter, MD 10 mL/hr at 02/04/24 0952 New Bag at 02/04/24 0952   CARBOplatin (PARAPLATIN) 180 mg in sodium chloride 0.9 % 100 mL chemo infusion  180 mg Intravenous Once Michaelyn Barter, MD       heparin lock flush 100 unit/mL  500 Units Intracatheter Once PRN Michaelyn Barter, MD        REVIEW OF SYSTEMS:   Pertinent information mentioned in HPI All other systems were reviewed with the patient and are negative.  PHYSICAL EXAMINATION: ECOG PERFORMANCE STATUS: 3 - Symptomatic, >50% confined to bed  Vitals:   02/04/24 0933  BP: (!) 155/75  Pulse: 90  Temp: 97.8 F (36.6 C)  SpO2: 100%   Filed Weights   02/04/24 0933  Weight: 170 lb (77.1 kg)    GENERAL:alert, no distress and comfortable SKIN: skin color, texture, turgor are normal, no rashes or significant lesions EYES: normal, conjunctiva are pink and non-injected, sclera clear OROPHARYNX:no exudate, no erythema and lips, buccal mucosa, and tongue normal  NECK: supple, thyroid normal size, non-tender, without nodularity LYMPH:  no palpable lymphadenopathy in the cervical, axillary or inguinal LUNGS: clear to auscultation and percussion with normal breathing effort HEART: regular rate & rhythm and no murmurs and no lower extremity  edema ABDOMEN:abdomen soft, non-tender and normal bowel sounds Musculoskeletal:no cyanosis of digits and no clubbing  PSYCH: alert & oriented x 3 with fluent speech NEURO: no focal motor/sensory deficits  LABORATORY DATA:  I have reviewed the data as listed Lab Results  Component Value Date   WBC 5.5 02/04/2024   HGB 10.4 (L) 02/04/2024   HCT 33.4 (L) 02/04/2024   MCV 79.3 (L) 02/04/2024   PLT 211 02/04/2024   Recent Labs    10/28/23 1450 10/28/23 2006 11/26/23 0538 12/17/23 1109 02/04/24 0905  NA 129*   < > 141 139 140  K 5.4*   < > 3.5 3.2* 3.6  CL 83*   < > 102 100 101  CO2 35*   < > 29 29 32  GLUCOSE 536*   < > 143* 155* 136*  BUN 84*   < > 21 10 14   CREATININE 1.23*   < > 0.65 0.62 0.51  CALCIUM 8.9   < > 9.1 9.0 9.5  GFRNONAA 48*   < > >  60 >60 >60  PROT 5.5*  --   --  6.4* 6.7  ALBUMIN 2.7*  --   --  3.4* 3.4*  AST 30  --   --  17 17  ALT 27  --   --  12 15  ALKPHOS 71  --   --  59 93  BILITOT 0.6  --   --  0.4 0.4   < > = values in this interval not displayed.    RADIOGRAPHIC STUDIES: I have personally reviewed the radiological images as listed and agreed with the findings in the report. IR IMAGING GUIDED PORT INSERTION Result Date: 01/18/2024 CLINICAL DATA:  Lung adenocarcinoma of the right upper lobe and need for porta cath to begin chemotherapy. EXAM: IMPLANTED PORT A CATH PLACEMENT WITH ULTRASOUND AND FLUOROSCOPIC GUIDANCE ANESTHESIA/SEDATION: Moderate (conscious) sedation was employed during this procedure. A total of Versed 1.0 mg and Fentanyl 50 mcg was administered intravenously. Moderate Sedation Time: 38 minutes. The patient's level of consciousness and vital signs were monitored continuously by radiology nursing throughout the procedure under my direct supervision. FLUOROSCOPY: 1 minute and 42 seconds.  4.5 mGy. PROCEDURE: The procedure, risks, benefits, and alternatives were explained to the patient. Questions regarding the procedure were encouraged and  answered. The patient understands and consents to the procedure. A time-out was performed prior to initiating the procedure. Ultrasound was utilized to confirm patency of the left internal jugular vein. An ultrasound image was saved and recorded. The left neck and chest were prepped with chlorhexidine in a sterile fashion, and a sterile drape was applied covering the operative field. Maximum barrier sterile technique with sterile gowns and gloves were used for the procedure. Local anesthesia was provided with 1% lidocaine. After creating a small venotomy incision, a 21 gauge needle was advanced into the left internal jugular vein under direct, real-time ultrasound guidance. Ultrasound image documentation was performed. After securing guidewire access, an 8 Fr dilator was placed. A J-wire was kinked to measure appropriate catheter length. A subcutaneous port pocket was then created along the upper chest wall utilizing sharp and blunt dissection. Portable cautery was utilized. The pocket was irrigated with sterile saline. A single lumen power injectable port was chosen for placement. The 8 Fr catheter was tunneled from the port pocket site to the venotomy incision. The port was placed in the pocket. External catheter was trimmed to appropriate length based on guidewire measurement. At the venotomy, an 8 Fr peel-away sheath was placed over a guidewire. The catheter was then placed through the sheath and the sheath removed. Final catheter positioning was confirmed and documented with a fluoroscopic spot image. The port was accessed with a needle and aspirated and flushed with heparinized saline. The access needle was removed. The venotomy and port pocket incisions were closed with subcutaneous 3-0 Monocryl and subcuticular 4-0 Vicryl. Dermabond was applied to both incisions. COMPLICATIONS: COMPLICATIONS None FINDINGS: After catheter placement, the tip lies at the cavo-atrial junction. The catheter aspirates normally and  is ready for immediate use. IMPRESSION: Placement of single lumen port a cath via left internal jugular vein. The catheter tip lies at the cavo-atrial junction. A power injectable port a cath was placed and is ready for immediate use. Electronically Signed   By: Irish Lack M.D.   On: 01/18/2024 11:30

## 2024-02-04 NOTE — Patient Instructions (Signed)
 [facility] - A DEPT OF [facility]  Discharge Instructions: Thank you for choosing [facility] to provide your oncology and hematology care.  If you have a lab appointment with the Cancer Center, please go directly to the Cancer Center and check in at the registration area.  Wear comfortable clothing and clothing appropriate for easy access to any Portacath or PICC line.   We strive to give you quality time with your provider. You may need to reschedule your appointment if you arrive late (15 or more minutes).  Arriving late affects you and other patients whose appointments are after yours.  Also, if you miss three or more appointments without notifying the office, you may be dismissed from the clinic at the provider's discretion.      For prescription refill requests, have your pharmacy contact our office and allow 72 hours for refills to be completed.    Today you received the following chemotherapy and/or immunotherapy agents Taxol & Carboplatin      To help prevent nausea and vomiting after your treatment, we encourage you to take your nausea medication as directed.  BELOW ARE SYMPTOMS THAT SHOULD BE REPORTED IMMEDIATELY: *FEVER GREATER THAN 100.4 F (38 C) OR HIGHER *CHILLS OR SWEATING *NAUSEA AND VOMITING THAT IS NOT CONTROLLED WITH YOUR NAUSEA MEDICATION *UNUSUAL SHORTNESS OF BREATH *UNUSUAL BRUISING OR BLEEDING *URINARY PROBLEMS (pain or burning when urinating, or frequent urination) *BOWEL PROBLEMS (unusual diarrhea, constipation, pain near the anus) TENDERNESS IN MOUTH AND THROAT WITH OR WITHOUT PRESENCE OF ULCERS (sore throat, sores in mouth, or a toothache) UNUSUAL RASH, SWELLING OR PAIN  UNUSUAL VAGINAL DISCHARGE OR ITCHING   Items with * indicate a potential emergency and should be followed up as soon as possible or go to the Emergency Department if any problems should occur.  Please show the CHEMOTHERAPY ALERT CARD or  IMMUNOTHERAPY ALERT CARD at check-in to the Emergency Department and triage nurse.  Should you have questions after your visit or need to cancel or reschedule your appointment, please contact [facility] - A DEPT OF [facility]  [phone] and follow the prompts.  Office hours are 8:00 a.m. to 4:30 p.m. Monday - Friday. Please note that voicemails left after 4:00 p.m. may not be returned until the following business day.  We are closed weekends and major holidays. You have access to a nurse at all times for urgent questions. Please call the main number to the clinic [phone] and follow the prompts.  For any non-urgent questions, you may also contact your provider using MyChart. We now offer e-Visits for anyone 67 and older to request care online for non-urgent symptoms. For details visit [email or web address].   Also download the MyChart app! Go to the app store, search "MyChart", open the app, select [facility], and log in with your MyChart username and password.

## 2024-02-04 NOTE — Progress Notes (Signed)
 Patient is doing ok, I am [facility] to get her medications switched over.

## 2024-02-07 ENCOUNTER — Telehealth: Payer: Self-pay

## 2024-02-07 ENCOUNTER — Other Ambulatory Visit: Payer: Self-pay

## 2024-02-07 ENCOUNTER — Ambulatory Visit
Admission: RE | Admit: 2024-02-07 | Discharge: 2024-02-07 | Disposition: A | Discharge status: Home / Self Care | Source: Ambulatory Visit | Attending: Radiation Oncology | Admitting: Radiation Oncology

## 2024-02-07 DIAGNOSIS — Z510 Encounter for antineoplastic radiation therapy: Secondary | ICD-10-CM | POA: Diagnosis not present

## 2024-02-07 LAB — RAD ONC ARIA SESSION SUMMARY
Course Elapsed Days: 7
Plan Fractions Treated to Date: 6
Plan Prescribed Dose Per Fraction: 2 Gy
Plan Total Fractions Prescribed: 33
Plan Total Prescribed Dose: 66 Gy
Reference Point Dosage Given to Date: 12 Gy
Reference Point Session Dosage Given: 2 Gy
Session Number: 6

## 2024-02-07 NOTE — Telephone Encounter (Signed)
 Telephone call to patient for follow up after receiving first infusion.   Patient daughter states infusion went great.  States eating good and drinking plenty of fluids.   Denies any nausea or vomiting.  Encouraged patient daughter to call for any concerns or questions.

## 2024-02-08 ENCOUNTER — Ambulatory Visit
Admission: RE | Admit: 2024-02-08 | Discharge: 2024-02-08 | Disposition: A | Discharge status: Home / Self Care | Source: Ambulatory Visit | Attending: Radiation Oncology | Admitting: Radiation Oncology

## 2024-02-08 ENCOUNTER — Encounter: Payer: Self-pay | Admitting: Internal Medicine

## 2024-02-08 ENCOUNTER — Other Ambulatory Visit: Payer: Self-pay

## 2024-02-08 DIAGNOSIS — Z510 Encounter for antineoplastic radiation therapy: Secondary | ICD-10-CM | POA: Diagnosis not present

## 2024-02-08 LAB — RAD ONC ARIA SESSION SUMMARY
Course Elapsed Days: 8
Plan Fractions Treated to Date: 7
Plan Prescribed Dose Per Fraction: 2 Gy
Plan Total Fractions Prescribed: 33
Plan Total Prescribed Dose: 66 Gy
Reference Point Dosage Given to Date: 14 Gy
Reference Point Session Dosage Given: 2 Gy
Session Number: 7

## 2024-02-09 ENCOUNTER — Other Ambulatory Visit: Payer: Self-pay

## 2024-02-09 ENCOUNTER — Ambulatory Visit
Admission: RE | Admit: 2024-02-09 | Discharge: 2024-02-09 | Disposition: A | Discharge status: Home / Self Care | Source: Ambulatory Visit | Attending: Radiation Oncology | Admitting: Radiation Oncology

## 2024-02-09 DIAGNOSIS — Z510 Encounter for antineoplastic radiation therapy: Secondary | ICD-10-CM | POA: Diagnosis not present

## 2024-02-09 LAB — RAD ONC ARIA SESSION SUMMARY
Course Elapsed Days: 9
Plan Fractions Treated to Date: 8
Plan Prescribed Dose Per Fraction: 2 Gy
Plan Total Fractions Prescribed: 33
Plan Total Prescribed Dose: 66 Gy
Reference Point Dosage Given to Date: 16 Gy
Reference Point Session Dosage Given: 2 Gy
Session Number: 8

## 2024-02-10 ENCOUNTER — Ambulatory Visit
Admission: RE | Admit: 2024-02-10 | Discharge: 2024-02-10 | Disposition: A | Discharge status: Home / Self Care | Source: Ambulatory Visit | Attending: Radiation Oncology | Admitting: Radiation Oncology

## 2024-02-10 ENCOUNTER — Other Ambulatory Visit: Payer: Self-pay

## 2024-02-10 DIAGNOSIS — Z510 Encounter for antineoplastic radiation therapy: Secondary | ICD-10-CM | POA: Diagnosis not present

## 2024-02-10 LAB — RAD ONC ARIA SESSION SUMMARY
Course Elapsed Days: 10
Plan Fractions Treated to Date: 9
Plan Prescribed Dose Per Fraction: 2 Gy
Plan Total Fractions Prescribed: 33
Plan Total Prescribed Dose: 66 Gy
Reference Point Dosage Given to Date: 18 Gy
Reference Point Session Dosage Given: 2 Gy
Session Number: 9

## 2024-02-11 ENCOUNTER — Inpatient Hospital Stay

## 2024-02-11 ENCOUNTER — Other Ambulatory Visit: Payer: Self-pay

## 2024-02-11 ENCOUNTER — Encounter: Payer: Self-pay | Admitting: Internal Medicine

## 2024-02-11 ENCOUNTER — Ambulatory Visit
Admission: RE | Admit: 2024-02-11 | Discharge: 2024-02-11 | Disposition: A | Discharge status: Home / Self Care | Source: Ambulatory Visit | Attending: Radiation Oncology | Admitting: Radiation Oncology

## 2024-02-11 ENCOUNTER — Inpatient Hospital Stay (HOSPITAL_BASED_OUTPATIENT_CLINIC_OR_DEPARTMENT_OTHER): Admitting: Internal Medicine

## 2024-02-11 ENCOUNTER — Ambulatory Visit

## 2024-02-11 ENCOUNTER — Encounter: Payer: Self-pay | Admitting: *Deleted

## 2024-02-11 VITALS — BP 141/67 | HR 85 | Temp 97.7°F | Resp 16 | Wt 170.0 lb

## 2024-02-11 VITALS — BP 155/96 | HR 97 | Resp 16

## 2024-02-11 DIAGNOSIS — D509 Iron deficiency anemia, unspecified: Secondary | ICD-10-CM | POA: Diagnosis not present

## 2024-02-11 DIAGNOSIS — C3411 Malignant neoplasm of upper lobe, right bronchus or lung: Secondary | ICD-10-CM

## 2024-02-11 DIAGNOSIS — Z510 Encounter for antineoplastic radiation therapy: Secondary | ICD-10-CM | POA: Diagnosis not present

## 2024-02-11 DIAGNOSIS — R52 Pain, unspecified: Secondary | ICD-10-CM

## 2024-02-11 DIAGNOSIS — Z5111 Encounter for antineoplastic chemotherapy: Secondary | ICD-10-CM

## 2024-02-11 LAB — CMP (CANCER CENTER ONLY)
ALT: 13 U/L (ref 0–44)
AST: 13 U/L — ABNORMAL LOW (ref 15–41)
Albumin: 3.5 g/dL (ref 3.5–5.0)
Alkaline Phosphatase: 79 U/L (ref 38–126)
Anion gap: 8 (ref 5–15)
BUN: 18 mg/dL (ref 8–23)
CO2: 30 mmol/L (ref 22–32)
Calcium: 9.3 mg/dL (ref 8.9–10.3)
Chloride: 101 mmol/L (ref 98–111)
Creatinine: 0.43 mg/dL — ABNORMAL LOW (ref 0.44–1.00)
GFR, Estimated: >60 mL/min (ref >60)
Glucose, Bld: 181 mg/dL — ABNORMAL HIGH (ref 70–99)
Potassium: 3.5 mmol/L (ref 3.5–5.1)
Sodium: 139 mmol/L (ref 135–145)
Total Bilirubin: 0.5 mg/dL (ref 0.0–1.2)
Total Protein: 6.2 g/dL — ABNORMAL LOW (ref 6.5–8.1)

## 2024-02-11 LAB — CBC WITH DIFFERENTIAL (CANCER CENTER ONLY)
Abs Immature Granulocytes: 0.04 10*3/uL (ref 0.00–0.07)
Basophils Absolute: 0 10*3/uL (ref 0.0–0.1)
Basophils Relative: 0 %
Eosinophils Absolute: 0 10*3/uL (ref 0.0–0.5)
Eosinophils Relative: 0 %
HCT: 34.3 % — ABNORMAL LOW (ref 36.0–46.0)
Hemoglobin: 10.8 g/dL — ABNORMAL LOW (ref 12.0–15.0)
Immature Granulocytes: 1 %
Lymphocytes Relative: 9 %
Lymphs Abs: 0.6 10*3/uL — ABNORMAL LOW (ref 0.7–4.0)
MCH: 24.4 pg — ABNORMAL LOW (ref 26.0–34.0)
MCHC: 31.5 g/dL (ref 30.0–36.0)
MCV: 77.4 fL — ABNORMAL LOW (ref 80.0–100.0)
Monocytes Absolute: 0.3 10*3/uL (ref 0.1–1.0)
Monocytes Relative: 5 %
Neutro Abs: 5.5 10*3/uL (ref 1.7–7.7)
Neutrophils Relative %: 85 %
Platelet Count: 236 10*3/uL (ref 150–400)
RBC: 4.43 MIL/uL (ref 3.87–5.11)
RDW: 14.9 % (ref 11.5–15.5)
WBC Count: 6.4 10*3/uL (ref 4.0–10.5)
nRBC: 0 % (ref 0.0–0.2)

## 2024-02-11 LAB — RAD ONC ARIA SESSION SUMMARY
Course Elapsed Days: 11
Plan Fractions Treated to Date: 10
Plan Prescribed Dose Per Fraction: 2 Gy
Plan Total Fractions Prescribed: 33
Plan Total Prescribed Dose: 66 Gy
Reference Point Dosage Given to Date: 20 Gy
Reference Point Session Dosage Given: 2 Gy
Session Number: 10

## 2024-02-11 MED ORDER — FAMOTIDINE IN NACL 20-0.9 MG/50ML-% IV SOLN
20.0000 mg | Freq: Once | INTRAVENOUS | Status: AC
  Administered 2024-02-11: 20 mg via INTRAVENOUS
  Filled 2024-02-11: qty 50

## 2024-02-11 MED ORDER — HEPARIN SOD (PORK) LOCK FLUSH 100 UNIT/ML IV SOLN
500.0000 [IU] | Freq: Once | INTRAVENOUS | Status: AC | PRN
  Administered 2024-02-11: 500 [IU]
  Filled 2024-02-11: qty 5

## 2024-02-11 MED ORDER — DIPHENHYDRAMINE HCL 50 MG/ML IJ SOLN
50.0000 mg | Freq: Once | INTRAMUSCULAR | Status: AC
  Administered 2024-02-11: 50 mg via INTRAVENOUS
  Filled 2024-02-11: qty 1

## 2024-02-11 MED ORDER — SODIUM CHLORIDE 0.9 % IV SOLN
45.0000 mg/m2 | Freq: Once | INTRAVENOUS | Status: AC
  Administered 2024-02-11: 84 mg via INTRAVENOUS
  Filled 2024-02-11: qty 14

## 2024-02-11 MED ORDER — DEXAMETHASONE SODIUM PHOSPHATE 10 MG/ML IJ SOLN
10.0000 mg | Freq: Once | INTRAMUSCULAR | Status: AC
  Administered 2024-02-11: 10 mg via INTRAVENOUS
  Filled 2024-02-11: qty 1

## 2024-02-11 MED ORDER — SODIUM CHLORIDE 0.9 % IV SOLN
178.2000 mg | Freq: Once | INTRAVENOUS | Status: AC
  Administered 2024-02-11: 180 mg via INTRAVENOUS
  Filled 2024-02-11: qty 18

## 2024-02-11 MED ORDER — OXYCODONE HCL 5 MG PO TABS
5.0000 mg | ORAL_TABLET | Freq: Once | ORAL | Status: AC
  Administered 2024-02-11: 5 mg via ORAL
  Filled 2024-02-11: qty 1

## 2024-02-11 MED ORDER — SODIUM CHLORIDE 0.9 % IV SOLN
INTRAVENOUS | Status: DC
  Filled 2024-02-11: qty 250

## 2024-02-11 MED ORDER — IRON SUCROSE 20 MG/ML IV SOLN
200.0000 mg | Freq: Once | INTRAVENOUS | Status: AC
  Administered 2024-02-11: 200 mg via INTRAVENOUS
  Filled 2024-02-11: qty 10

## 2024-02-11 MED ORDER — PALONOSETRON HCL INJECTION 0.25 MG/5ML
0.2500 mg | Freq: Once | INTRAVENOUS | Status: AC
  Administered 2024-02-11: 0.25 mg via INTRAVENOUS
  Filled 2024-02-11: qty 5

## 2024-02-11 NOTE — Progress Notes (Signed)
 [facility] CONSULT NOTE  Patient Care Team: Center, [facility] as PCP - General (General Practice) Jim Like, RN as Registered Nurse Scarlett Presto, RN (Inactive) as Registered Nurse Michaelyn Barter, MD as Consulting Physician (Oncology) Glory Buff, RN as Oncology Nurse Navigator Carmina Miller, MD as Consulting Physician (Radiation Oncology)  Reason for visit- right upper lobe lung non-small cell cancer.  CANCER STAGING   Cancer Staging  Cancer of upper lobe of right lung [facility]) Staging form: Lung, AJCC V9 - Clinical stage from 11/12/2023: Stage IIIA (cT3, cN1, cM0) - Signed by Michaelyn Barter, MD on 01/07/2024   ASSESSMENT & PLAN:  Michele House 68 y.o. 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  # RUL NSCLC, Stage IIIA - Status post EBUS with biopsy with Dr.Aleskerov.  Right upper lobe lung biopsy showed non-small cell carcinoma.  Coexpress is TTF-1 and p40.  With TTF-1 expression, could be adenocarcinoma.  Insufficient material for ancillary molecular testing. RUL bronchial lavage positive for malignancy. Station 7 lymph node negative for malignancy.  -PET scan from April 22, 2024 showed 1.8 cm nodule in the right upper lobe with SUV of 37.5.  6 mm nodule more superiorly hypermetabolic for size could be satellite lesion.  There are several small hypermetabolic right hilar lymph nodes largest with SUV of 16.  No evidence of metastatic disease otherwise.  -Started on radiation on 01/31/2024.  Labs reviewed and acceptable for treatment.  Will proceed with cycle 2 of carboplatin AUC 2 and Taxol 45 mg/m2.  Overall tolerating treatment well except feeling sick at her stomach.  She did not ask for antiemetics at the facility.  This was emphasized today.  -MRI brain with and without contrast negative for  metastasis.   -Liquid Tempus testing sent.  Result pending.  # COPD on 2 L oxygen -Continue follow-up with pulmonary  # Iron deficiency anemia -Hemoglobin 10.6.  Ferritin 37 and saturation 10%. -Schedule for IV Venofer 200 mg weekly x 5 doses.  # History of GI bleed -s/p EGD which showed bleeding duodenal ulcer with clip placed 10/28/2023.  - s/p IR embolization on 10/30/2023.  Plavix was discontinued.  She was continued on baby aspirin.  # History of DVT - Had bilateral lower extremity Dopplers for leg pain.  Showed right calf DVT.   - Status post IVC filter on 11/09/2023.  RTC in 1 week with APP, labs, CarboTaxol, Venofer  Orders Placed This Encounter  Procedures   CBC with Differential (Cancer Center Only)    Standing Status:   Future    Expected Date:   03/03/2024    Expiration Date:   03/03/2025   CMP (Cancer Center only)    Standing Status:   Future    Expected Date:   03/03/2024    Expiration Date:   03/03/2025    The total time spent in the appointment was 30 minutes encounter with patients including review of chart and various tests results, discussions about plan of care and coordination of care plan   All questions were answered. The patient knows to call the clinic with any problems, questions or concerns. No barriers to learning was detected.  Michaelyn Barter, MD 4/11/20259:37 AM   HISTORY OF PRESENTING ILLNESS:  Michele House 68 y.o. 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  Patient had long complicated hospital course from 10/02/2023 to 11/15/2023.  Presented with COPD exacerbation, pneumonia, UTI and encephalopathy.  Requiring intubation.   CTA chest from 09/24/2023 showed increase in size of spiculated pulmonary nodule in the inferior right upper lobe 1.6 cm compared to 1.3 cm previously.  Initial lung biopsy on  10/20/2023 was nondiagnostic.    Repeat EBUS with biopsy with Dr.Aleskerov on 11/12/2023.  Right upper lobe lung biopsy showed non-small cell carcinoma.  Coexpress is TTF-1 and p40.  Insufficient material for ancillary molecular testing. RUL bronchial lavage positive for malignancy. Station 7 lymph node negative for malignancy.  Hospital course complicated by encephalopathy.  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  EEG showed diffuse slowing without seizures.  LP unremarkable.  Autoimmune/paraneoplastic panel sent.  Treated with 5 days of high-dose Solu-Medrol with not much improvement.  Also developed GI bleed causing hemorrhagic shock. s/p EGD which showed bleeding duodenal ulcer with clip placed 10/28/2023. s/p IR embolization on 10/30/2023.  Plavix was discontinued.  She was continued on baby aspirin.  Had bilateral lower extremity Dopplers for leg pain.  Showed right calf DVT.  Status post IVC filter on 11/09/2023.  Interval history Patient was seen today in the clinic accompanied with daughter.  On concurrent chemo RT for stage IIIa lung cancer. Doing well overall.  Appetite is good.  Has baseline numbness in her bilateral feet.  On 2 L oxygen at baseline.  Denies any worsening shortness of breath.  The only complaint she had after 4 cycles of treatment is feeling sick of the stomach.  Did not use any antiemetics.  We discussed about requesting the facility for it.  I have reviewed her chart and materials related to her cancer extensively and collaborated history with the patient. Summary of oncologic history is as follows: Oncology History  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)  11/12/2023 Cancer Staging   Staging form: Lung, AJCC V9 - Clinical stage from 11/12/2023: Stage IIIA (cT3, cN1, cM0) -  Signed by Michaelyn Barter, MD on 01/07/2024   12/17/2023 Initial Diagnosis   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)   02/04/2024 -  Chemotherapy   Patient is on Treatment Plan : LUNG Carboplatin + Paclitaxel + XRT q7d       MEDICAL HISTORY:  Past Medical History:  Diagnosis Date   Acid reflux    COPD (chronic obstructive pulmonary disease) (HCC)    Diabetes mellitus without complication (HCC)    Hyperlipidemia    Hypertension    Thyroid disease     SURGICAL HISTORY: Past Surgical History:  Procedure Laterality Date   appendectomy     APPENDECTOMY     BREAST BIOPSY Right    CORE W/CLIP - NEG   ESOPHAGOGASTRODUODENOSCOPY N/A 10/28/2023   Procedure: ESOPHAGOGASTRODUODENOSCOPY (EGD);  Surgeon: Midge Minium, MD;  Location: [facility];  Service: Endoscopy;  Laterality: N/A;   FLEXIBLE BRONCHOSCOPY Bilateral 11/12/2023   Procedure: FLEXIBLE BRONCHOSCOPY;  Surgeon: Vida Rigger, MD;  Location: [facility] ORS;  Service: Thoracic;  Laterality: Bilateral;   IR EMBO ART  VEN HEMORR LYMPH EXTRAV  INC GUIDE ROADMAPPING  10/30/2023   IR IMAGING GUIDED PORT INSERTION  01/18/2024   IVC FILTER INSERTION N/A 11/09/2023  Procedure: IVC FILTER INSERTION;  Surgeon: Annice Needy, MD;  Location: [facility] INVASIVE CV LAB;  Service: Cardiovascular;  Laterality: N/A;   TOTAL VAGINAL HYSTERECTOMY     TUMOR REMOVAL     benign;stomach   VIDEO BRONCHOSCOPY WITH ENDOBRONCHIAL NAVIGATION Right 10/20/2023   Procedure: VIDEO BRONCHOSCOPY WITH ENDOBRONCHIAL NAVIGATION;  Surgeon: Vida Rigger, MD;  Location: [facility] ORS;  Service: Thoracic;  Laterality: Right;   VIDEO BRONCHOSCOPY WITH ENDOBRONCHIAL NAVIGATION Right 11/12/2023   Procedure: VIDEO BRONCHOSCOPY WITH ENDOBRONCHIAL NAVIGATION;  Surgeon: Vida Rigger, MD;  Location: [facility] ORS;  Service: Thoracic;  Laterality: Right;    SOCIAL HISTORY: Social History   Socioeconomic History   Marital status: Single    Spouse name: Not on file   Number of  children: Not on file   Years of education: Not on file   Highest education level: Not on file  Occupational History   Not on file  Tobacco Use   Smoking status: Former    Current packs/day: 0.00    Types: Cigarettes    Quit date: 12/15/1985    Years since quitting: 38.1   Smokeless tobacco: Never  Substance and Sexual Activity   Alcohol use: No   Drug use: No   Sexual activity: Not Currently  Other Topics Concern   Not on file  Social History Narrative   Not on file   Social Drivers of Health   Financial Resource Strain: Not on file  Food Insecurity: Patient Unable To Answer (10/03/2023)   Hunger Vital Sign    Worried About Running Out of Food in the Last Year: Patient unable to answer    Ran Out of Food in the Last Year: Patient unable to answer  Recent Concern: Food Insecurity - Food Insecurity Present (09/24/2023)   Hunger Vital Sign    Worried About Running Out of Food in the Last Year: Sometimes true    Ran Out of Food in the Last Year: Sometimes true  Transportation Needs: Patient Unable To Answer (10/03/2023)   PRAPARE - Administrator, Civil Service (Medical): Patient unable to answer    Lack of Transportation (Non-Medical): Patient unable to answer  Recent Concern: Transportation Needs - Unmet Transportation Needs (09/24/2023)   PRAPARE - Administrator, Civil Service (Medical): Yes    Lack of Transportation (Non-Medical): Yes  Physical Activity: Not on file  Stress: Not on file  Social Connections: Patient Declined (11/02/2023)   Social Connection and Isolation Panel [NHANES]    Frequency of Communication with Friends and Family: Patient declined    Frequency of Social Gatherings with Friends and Family: Patient declined    Attends Religious Services: Patient declined    Database administrator or Organizations: Patient declined    Attends Banker Meetings: Patient declined    Marital Status: Patient declined  Intimate  Partner Violence: Patient Unable To Answer (10/03/2023)   Humiliation, Afraid, Rape, and Kick questionnaire    Fear of Current or Ex-Partner: Patient unable to answer    Emotionally Abused: Patient unable to answer    Physically Abused: Patient unable to answer    Sexually Abused: Patient unable to answer    FAMILY HISTORY: Family History  Problem Relation Age of Onset   Heart attack Mother    Hypertension Mother    Breast cancer Sister 68   Breast cancer Maternal Aunt        40'S   Breast cancer Maternal Grandmother  ALLERGIES:  is allergic to penicillins, aspirin, and sulfa antibiotics.  MEDICATIONS:  Current Outpatient Medications  Medication Sig Dispense Refill   acetaminophen (TYLENOL) 325 MG tablet Take 650 mg by mouth every 6 (six) hours as needed.     aspirin EC 81 MG tablet Take 81 mg by mouth daily.     atorvastatin (LIPITOR) 20 MG tablet Take 20 mg by mouth daily.     calcium carbonate (TUMS - DOSED IN MG ELEMENTAL CALCIUM) 500 MG chewable tablet Chew 1 tablet by mouth daily.     cetirizine (ZYRTEC) 10 MG tablet Take 10 mg by mouth daily.     COMBIVENT RESPIMAT 20-100 MCG/ACT AERS respimat Inhale into the lungs every 6 (six) hours as needed for wheezing or shortness of breath.     dexamethasone (DECADRON) 4 MG tablet Take 2 tablets daily for 2 days, start the day after chemotherapy. Take with food. 30 tablet 1   dextromethorphan-guaiFENesin (MUCINEX DM) 30-600 MG 12hr tablet Take 1 tablet by mouth 2 (two) times daily as needed for cough. 30 tablet 0   diclofenac sodium (VOLTAREN) 1 % GEL Apply topically 4 (four) times daily.     docusate sodium (COLACE) 100 MG capsule Take 1 capsule (100 mg total) by mouth 2 (two) times daily.     DULERA 200-5 MCG/ACT AERO Inhale 2 puffs into the lungs every 12 (twelve) hours.     furosemide (LASIX) 20 MG tablet Take 1 tablet (20 mg total) by mouth daily. 30 tablet 0   glipiZIDE (GLUCOTROL) 10 MG tablet Take 10 mg by mouth daily  before breakfast.     insulin aspart (NOVOLOG) 100 UNIT/ML injection Inject 4 Units into the skin 3 (three) times daily with meals.     insulin glargine-yfgn (SEMGLEE) 100 UNIT/ML injection Inject 0.18 mLs (18 Units total) into the skin daily.     ipratropium (ATROVENT HFA) 17 MCG/ACT inhaler Inhale 2 puffs into the lungs every 6 (six) hours.     levETIRAcetam (KEPPRA) 500 MG tablet Take 1 tablet (500 mg total) by mouth 2 (two) times daily.     levothyroxine (SYNTHROID) 150 MCG tablet Take 1 tablet (150 mcg total) by mouth daily before breakfast. Increased from 100.     lidocaine-prilocaine (EMLA) cream Apply to affected area once 30 g 3   [Paused] lisinopril-hydrochlorothiazide (PRINZIDE,ZESTORETIC) 20-25 MG per tablet Take 1 tablet by mouth daily. (Patient not taking: Reported on 01/17/2024)     loperamide (IMODIUM A-D) 2 MG tablet Take 2 mg by mouth 4 (four) times daily as needed for diarrhea or loose stools.     LORazepam (ATIVAN) 0.5 MG tablet Take 0.5 mg by mouth every 8 (eight) hours as needed for anxiety.     Multiple Vitamin (MULTIVITAMIN) capsule Take 1 capsule by mouth daily.     naloxone (NARCAN) nasal spray 4 mg/0.1 mL Place 1 spray into the nose.     ondansetron (ZOFRAN) 4 MG/5ML solution Take by mouth once.     ondansetron (ZOFRAN) 8 MG tablet Take 1 tablet (8 mg total) by mouth every 8 (eight) hours as needed for nausea or vomiting. Start on the third day after chemotherapy. 30 tablet 1   pantoprazole (PROTONIX) 40 MG tablet Take 1 tablet (40 mg total) by mouth 2 (two) times daily before a meal.     polyethylene glycol (MIRALAX / GLYCOLAX) 17 g packet Take 17 g by mouth daily.     pregabalin (LYRICA) 50 MG capsule Take 50 mg by  mouth 2 (two) times daily.     prochlorperazine (COMPAZINE) 10 MG tablet Take 1 tablet (10 mg total) by mouth every 6 (six) hours as needed for nausea or vomiting. 30 tablet 1   rOPINIRole (REQUIP XL) 4 MG 24 hr tablet Take 1 tablet (4 mg total) by mouth at  bedtime.     sertraline (ZOLOFT) 100 MG tablet Take 100 mg by mouth daily.     TRADJENTA 5 MG TABS tablet Take 5 mg by mouth daily.     No current facility-administered medications for this visit.    REVIEW OF SYSTEMS:   Pertinent information mentioned in HPI All other systems were reviewed with the patient and are negative.  PHYSICAL EXAMINATION: ECOG PERFORMANCE STATUS: 3 - Symptomatic, >50% confined to bed  Vitals:   02/11/24 0850  BP: (!) 141/67  Pulse: 85  Resp: 16  Temp: 97.7 F (36.5 C)  SpO2: 98%   Filed Weights   02/11/24 0850  Weight: 170 lb (77.1 kg)    GENERAL:alert, no distress and comfortable SKIN: skin color, texture, turgor are normal, no rashes or significant lesions EYES: normal, conjunctiva are pink and non-injected, sclera clear OROPHARYNX:no exudate, no erythema and lips, buccal mucosa, and tongue normal  NECK: supple, thyroid normal size, non-tender, without nodularity LYMPH:  no palpable lymphadenopathy in the cervical, axillary or inguinal LUNGS: clear to auscultation and percussion with normal breathing effort HEART: regular rate & rhythm and no murmurs and no lower extremity edema ABDOMEN:abdomen soft, non-tender and normal bowel sounds Musculoskeletal:no cyanosis of digits and no clubbing  PSYCH: alert & oriented x 3 with fluent speech NEURO: no focal motor/sensory deficits  LABORATORY DATA:  I have reviewed the data as listed Lab Results  Component Value Date   WBC 6.4 02/11/2024   HGB 10.8 (L) 02/11/2024   HCT 34.3 (L) 02/11/2024   MCV 77.4 (L) 02/11/2024   PLT 236 02/11/2024   Recent Labs    12/17/23 1109 02/04/24 0905 02/11/24 0830  NA 139 140 139  K 3.2* 3.6 3.5  CL 100 101 101  CO2 29 32 30  GLUCOSE 155* 136* 181*  BUN 10 14 18   CREATININE 0.62 0.51 0.43*  CALCIUM 9.0 9.5 9.3  GFRNONAA >60 >60 >60  PROT 6.4* 6.7 6.2*  ALBUMIN 3.4* 3.4* 3.5  AST 17 17 13*  ALT 12 15 13   ALKPHOS 59 93 79  BILITOT 0.4 0.4 0.5     RADIOGRAPHIC STUDIES: I have personally reviewed the radiological images as listed and agreed with the findings in the report. MR Brain W Wo Contrast Result Date: 02/07/2024 CLINICAL DATA:  Non-small cell lung carcinoma EXAM: MRI HEAD WITHOUT AND WITH CONTRAST TECHNIQUE: Multiplanar, multiecho pulse sequences of the brain and surrounding structures were obtained without and with intravenous contrast. CONTRAST:  7mL GADAVIST GADOBUTROL 1 MMOL/ML IV SOLN COMPARISON:  10/13/2023 FINDINGS: Brain: Resolving diffusion changes within the bilateral posterior hemispheres. Multifocal hyperintense T2-weighted signal within the white matter. Recent bilateral corona radiata infarcts. Mild volume loss. No chronic microhemorrhage. Normal midline structures. There is no abnormal contrast enhancement. Vascular: Major flow voids are preserved. Skull and upper cervical spine: Normal calvarium and skull base. Visualized upper cervical spine and soft tissues are normal. Sinuses/Orbits:Right mastoid effusion. Paranasal sinuses are clear. Normal orbits. IMPRESSION: 1. No metastatic disease. 2. Resolving diffusion changes within the bilateral posterior hemispheres at the site of recent bilateral corona radiata infarcts. Electronically Signed   By: Deatra Robinson M.D.   On:  02/07/2024 02:51   IR IMAGING GUIDED PORT INSERTION Result Date: 01/18/2024 CLINICAL DATA:  Lung adenocarcinoma of the right upper lobe and need for porta cath to begin chemotherapy. EXAM: IMPLANTED PORT A CATH PLACEMENT WITH ULTRASOUND AND FLUOROSCOPIC GUIDANCE ANESTHESIA/SEDATION: Moderate (conscious) sedation was employed during this procedure. A total of Versed 1.0 mg and Fentanyl 50 mcg was administered intravenously. Moderate Sedation Time: 38 minutes. The patient's level of consciousness and vital signs were monitored continuously by radiology nursing throughout the procedure under my direct supervision. FLUOROSCOPY: 1 minute and 42 seconds.  4.5 mGy.  PROCEDURE: The procedure, risks, benefits, and alternatives were explained to the patient. Questions regarding the procedure were encouraged and answered. The patient understands and consents to the procedure. A time-out was performed prior to initiating the procedure. Ultrasound was utilized to confirm patency of the left internal jugular vein. An ultrasound image was saved and recorded. The left neck and chest were prepped with chlorhexidine in a sterile fashion, and a sterile drape was applied covering the operative field. Maximum barrier sterile technique with sterile gowns and gloves were used for the procedure. Local anesthesia was provided with 1% lidocaine. After creating a small venotomy incision, a 21 gauge needle was advanced into the left internal jugular vein under direct, real-time ultrasound guidance. Ultrasound image documentation was performed. After securing guidewire access, an 8 Fr dilator was placed. A J-wire was kinked to measure appropriate catheter length. A subcutaneous port pocket was then created along the upper chest wall utilizing sharp and blunt dissection. Portable cautery was utilized. The pocket was irrigated with sterile saline. A single lumen power injectable port was chosen for placement. The 8 Fr catheter was tunneled from the port pocket site to the venotomy incision. The port was placed in the pocket. External catheter was trimmed to appropriate length based on guidewire measurement. At the venotomy, an 8 Fr peel-away sheath was placed over a guidewire. The catheter was then placed through the sheath and the sheath removed. Final catheter positioning was confirmed and documented with a fluoroscopic spot image. The port was accessed with a needle and aspirated and flushed with heparinized saline. The access needle was removed. The venotomy and port pocket incisions were closed with subcutaneous 3-0 Monocryl and subcuticular 4-0 Vicryl. Dermabond was applied to both incisions.  COMPLICATIONS: COMPLICATIONS None FINDINGS: After catheter placement, the tip lies at the cavo-atrial junction. The catheter aspirates normally and is ready for immediate use. IMPRESSION: Placement of single lumen port a cath via left internal jugular vein. The catheter tip lies at the cavo-atrial junction. A power injectable port a cath was placed and is ready for immediate use. Electronically Signed   By: Irish Lack M.D.   On: 01/18/2024 11:30

## 2024-02-11 NOTE — Progress Notes (Signed)
 Patient is doing good, she feels like treatment is going good. No new questions for the doctor today.

## 2024-02-11 NOTE — Patient Instructions (Signed)
 [facility] - A DEPT OF [facility]  Discharge Instructions: Thank you for choosing [facility] to provide your oncology and hematology care.  If you have a lab appointment with the Cancer Center, please go directly to the Cancer Center and check in at the registration area.  Wear comfortable clothing and clothing appropriate for easy access to any Portacath or PICC line.   We strive to give you quality time with your provider. You may need to reschedule your appointment if you arrive late (15 or more minutes).  Arriving late affects you and other patients whose appointments are after yours.  Also, if you miss three or more appointments without notifying the office, you may be dismissed from the clinic at the provider's discretion.      For prescription refill requests, have your pharmacy contact our office and allow 72 hours for refills to be completed.    Today you received the following chemotherapy and/or immunotherapy agents Taxol & Carboplatin      To help prevent nausea and vomiting after your treatment, we encourage you to take your nausea medication as directed.  BELOW ARE SYMPTOMS THAT SHOULD BE REPORTED IMMEDIATELY: *FEVER GREATER THAN 100.4 F (38 C) OR HIGHER *CHILLS OR SWEATING *NAUSEA AND VOMITING THAT IS NOT CONTROLLED WITH YOUR NAUSEA MEDICATION *UNUSUAL SHORTNESS OF BREATH *UNUSUAL BRUISING OR BLEEDING *URINARY PROBLEMS (pain or burning when urinating, or frequent urination) *BOWEL PROBLEMS (unusual diarrhea, constipation, pain near the anus) TENDERNESS IN MOUTH AND THROAT WITH OR WITHOUT PRESENCE OF ULCERS (sore throat, sores in mouth, or a toothache) UNUSUAL RASH, SWELLING OR PAIN  UNUSUAL VAGINAL DISCHARGE OR ITCHING   Items with * indicate a potential emergency and should be followed up as soon as possible or go to the Emergency Department if any problems should occur.  Please show the CHEMOTHERAPY ALERT CARD or  IMMUNOTHERAPY ALERT CARD at check-in to the Emergency Department and triage nurse.  Should you have questions after your visit or need to cancel or reschedule your appointment, please contact [facility] - A DEPT OF [facility]  [phone] and follow the prompts.  Office hours are 8:00 a.m. to 4:30 p.m. Monday - Friday. Please note that voicemails left after 4:00 p.m. may not be returned until the following business day.  We are closed weekends and major holidays. You have access to a nurse at all times for urgent questions. Please call the main number to the clinic [phone] and follow the prompts.  For any non-urgent questions, you may also contact your provider using MyChart. We now offer e-Visits for anyone 67 and older to request care online for non-urgent symptoms. For details visit [email or web address].   Also download the MyChart app! Go to the app store, search "MyChart", open the app, select [facility], and log in with your MyChart username and password.

## 2024-02-14 ENCOUNTER — Other Ambulatory Visit: Payer: Self-pay

## 2024-02-14 ENCOUNTER — Ambulatory Visit
Admission: RE | Admit: 2024-02-14 | Discharge: 2024-02-14 | Disposition: A | Discharge status: Home / Self Care | Source: Ambulatory Visit | Attending: Radiation Oncology | Admitting: Radiation Oncology

## 2024-02-14 DIAGNOSIS — Z510 Encounter for antineoplastic radiation therapy: Secondary | ICD-10-CM | POA: Diagnosis not present

## 2024-02-14 LAB — RAD ONC ARIA SESSION SUMMARY
Course Elapsed Days: 14
Plan Fractions Treated to Date: 11
Plan Prescribed Dose Per Fraction: 2 Gy
Plan Total Fractions Prescribed: 33
Plan Total Prescribed Dose: 66 Gy
Reference Point Dosage Given to Date: 22 Gy
Reference Point Session Dosage Given: 2 Gy
Session Number: 11

## 2024-02-15 ENCOUNTER — Ambulatory Visit
Admission: RE | Admit: 2024-02-15 | Discharge: 2024-02-15 | Disposition: A | Discharge status: Home / Self Care | Source: Ambulatory Visit | Attending: Radiation Oncology | Admitting: Radiation Oncology

## 2024-02-15 ENCOUNTER — Other Ambulatory Visit: Payer: Self-pay

## 2024-02-15 DIAGNOSIS — Z510 Encounter for antineoplastic radiation therapy: Secondary | ICD-10-CM | POA: Diagnosis not present

## 2024-02-15 LAB — RAD ONC ARIA SESSION SUMMARY
Course Elapsed Days: 15
Plan Fractions Treated to Date: 12
Plan Prescribed Dose Per Fraction: 2 Gy
Plan Total Fractions Prescribed: 33
Plan Total Prescribed Dose: 66 Gy
Reference Point Dosage Given to Date: 24 Gy
Reference Point Session Dosage Given: 2 Gy
Session Number: 12

## 2024-02-16 ENCOUNTER — Other Ambulatory Visit: Payer: Self-pay

## 2024-02-16 ENCOUNTER — Ambulatory Visit
Admission: RE | Admit: 2024-02-16 | Discharge: 2024-02-16 | Disposition: A | Discharge status: Home / Self Care | Source: Ambulatory Visit | Attending: Radiation Oncology | Admitting: Radiation Oncology

## 2024-02-16 DIAGNOSIS — Z510 Encounter for antineoplastic radiation therapy: Secondary | ICD-10-CM | POA: Diagnosis not present

## 2024-02-16 LAB — RAD ONC ARIA SESSION SUMMARY
Course Elapsed Days: 16
Plan Fractions Treated to Date: 13
Plan Prescribed Dose Per Fraction: 2 Gy
Plan Total Fractions Prescribed: 33
Plan Total Prescribed Dose: 66 Gy
Reference Point Dosage Given to Date: 26 Gy
Reference Point Session Dosage Given: 2 Gy
Session Number: 13

## 2024-02-17 ENCOUNTER — Ambulatory Visit
Admission: RE | Admit: 2024-02-17 | Discharge: 2024-02-17 | Disposition: A | Discharge status: Home / Self Care | Source: Ambulatory Visit | Attending: Radiation Oncology | Admitting: Radiation Oncology

## 2024-02-17 ENCOUNTER — Other Ambulatory Visit: Payer: Self-pay

## 2024-02-17 DIAGNOSIS — Z510 Encounter for antineoplastic radiation therapy: Secondary | ICD-10-CM | POA: Diagnosis not present

## 2024-02-17 LAB — RAD ONC ARIA SESSION SUMMARY
Course Elapsed Days: 17
Plan Fractions Treated to Date: 14
Plan Prescribed Dose Per Fraction: 2 Gy
Plan Total Fractions Prescribed: 33
Plan Total Prescribed Dose: 66 Gy
Reference Point Dosage Given to Date: 28 Gy
Reference Point Session Dosage Given: 2 Gy
Session Number: 14

## 2024-02-18 ENCOUNTER — Inpatient Hospital Stay

## 2024-02-18 ENCOUNTER — Encounter: Payer: Self-pay | Admitting: Nurse Practitioner

## 2024-02-18 ENCOUNTER — Ambulatory Visit
Admission: RE | Admit: 2024-02-18 | Discharge: 2024-02-18 | Disposition: A | Discharge status: Home / Self Care | Source: Ambulatory Visit | Attending: Radiation Oncology | Admitting: Radiation Oncology

## 2024-02-18 ENCOUNTER — Inpatient Hospital Stay (HOSPITAL_BASED_OUTPATIENT_CLINIC_OR_DEPARTMENT_OTHER): Admitting: Nurse Practitioner

## 2024-02-18 ENCOUNTER — Other Ambulatory Visit: Payer: Self-pay

## 2024-02-18 VITALS — BP 114/75 | HR 99 | Temp 97.6°F | Resp 16

## 2024-02-18 DIAGNOSIS — D509 Iron deficiency anemia, unspecified: Secondary | ICD-10-CM

## 2024-02-18 DIAGNOSIS — C3411 Malignant neoplasm of upper lobe, right bronchus or lung: Secondary | ICD-10-CM

## 2024-02-18 DIAGNOSIS — Z5111 Encounter for antineoplastic chemotherapy: Secondary | ICD-10-CM | POA: Diagnosis not present

## 2024-02-18 DIAGNOSIS — Z510 Encounter for antineoplastic radiation therapy: Secondary | ICD-10-CM | POA: Diagnosis not present

## 2024-02-18 DIAGNOSIS — M79604 Pain in right leg: Secondary | ICD-10-CM

## 2024-02-18 LAB — CMP (CANCER CENTER ONLY)
ALT: 12 U/L (ref 0–44)
AST: 16 U/L (ref 15–41)
Albumin: 3.5 g/dL (ref 3.5–5.0)
Alkaline Phosphatase: 77 U/L (ref 38–126)
Anion gap: 8 (ref 5–15)
BUN: 14 mg/dL (ref 8–23)
CO2: 31 mmol/L (ref 22–32)
Calcium: 9.3 mg/dL (ref 8.9–10.3)
Chloride: 101 mmol/L (ref 98–111)
Creatinine: 0.64 mg/dL (ref 0.44–1.00)
GFR, Estimated: >60 mL/min (ref >60)
Glucose, Bld: 124 mg/dL — ABNORMAL HIGH (ref 70–99)
Potassium: 3.4 mmol/L — ABNORMAL LOW (ref 3.5–5.1)
Sodium: 140 mmol/L (ref 135–145)
Total Bilirubin: 0.5 mg/dL (ref 0.0–1.2)
Total Protein: 6.2 g/dL — ABNORMAL LOW (ref 6.5–8.1)

## 2024-02-18 LAB — RAD ONC ARIA SESSION SUMMARY
Course Elapsed Days: 18
Plan Fractions Treated to Date: 15
Plan Prescribed Dose Per Fraction: 2 Gy
Plan Total Fractions Prescribed: 33
Plan Total Prescribed Dose: 66 Gy
Reference Point Dosage Given to Date: 30 Gy
Reference Point Session Dosage Given: 2 Gy
Session Number: 15

## 2024-02-18 LAB — CBC WITH DIFFERENTIAL (CANCER CENTER ONLY)
Abs Immature Granulocytes: 0.02 10*3/uL (ref 0.00–0.07)
Basophils Absolute: 0 10*3/uL (ref 0.0–0.1)
Basophils Relative: 0 %
Eosinophils Absolute: 0 10*3/uL (ref 0.0–0.5)
Eosinophils Relative: 0 %
HCT: 34.2 % — ABNORMAL LOW (ref 36.0–46.0)
Hemoglobin: 10.6 g/dL — ABNORMAL LOW (ref 12.0–15.0)
Immature Granulocytes: 0 %
Lymphocytes Relative: 11 %
Lymphs Abs: 0.5 10*3/uL — ABNORMAL LOW (ref 0.7–4.0)
MCH: 24.6 pg — ABNORMAL LOW (ref 26.0–34.0)
MCHC: 31 g/dL (ref 30.0–36.0)
MCV: 79.4 fL — ABNORMAL LOW (ref 80.0–100.0)
Monocytes Absolute: 0.4 10*3/uL (ref 0.1–1.0)
Monocytes Relative: 8 %
Neutro Abs: 3.8 10*3/uL (ref 1.7–7.7)
Neutrophils Relative %: 81 %
Platelet Count: 231 10*3/uL (ref 150–400)
RBC: 4.31 MIL/uL (ref 3.87–5.11)
RDW: 15.6 % — ABNORMAL HIGH (ref 11.5–15.5)
WBC Count: 4.7 10*3/uL (ref 4.0–10.5)
nRBC: 0 % (ref 0.0–0.2)

## 2024-02-18 MED ORDER — HEPARIN SOD (PORK) LOCK FLUSH 100 UNIT/ML IV SOLN
500.0000 [IU] | Freq: Once | INTRAVENOUS | Status: AC | PRN
  Administered 2024-02-18: 500 [IU]
  Filled 2024-02-18: qty 5

## 2024-02-18 MED ORDER — PALONOSETRON HCL INJECTION 0.25 MG/5ML
0.2500 mg | Freq: Once | INTRAVENOUS | Status: AC
Start: 2024-02-18 — End: 2024-02-18
  Administered 2024-02-18: 0.25 mg via INTRAVENOUS
  Filled 2024-02-18: qty 5

## 2024-02-18 MED ORDER — SODIUM CHLORIDE 0.9 % IV SOLN
INTRAVENOUS | Status: DC
  Filled 2024-02-18: qty 250

## 2024-02-18 MED ORDER — DEXAMETHASONE SODIUM PHOSPHATE 10 MG/ML IJ SOLN
10.0000 mg | Freq: Once | INTRAMUSCULAR | Status: AC
  Administered 2024-02-18: 10 mg via INTRAVENOUS
  Filled 2024-02-18: qty 1

## 2024-02-18 MED ORDER — SODIUM CHLORIDE 0.9 % IV SOLN
178.2000 mg | Freq: Once | INTRAVENOUS | Status: AC
  Administered 2024-02-18: 180 mg via INTRAVENOUS
  Filled 2024-02-18: qty 18

## 2024-02-18 MED ORDER — OXYCODONE HCL 5 MG PO TABS
5.0000 mg | ORAL_TABLET | Freq: Once | ORAL | Status: AC | PRN
  Administered 2024-02-18: 5 mg via ORAL
  Filled 2024-02-18: qty 1

## 2024-02-18 MED ORDER — SODIUM CHLORIDE 0.9 % IV SOLN
45.0000 mg/m2 | Freq: Once | INTRAVENOUS | Status: AC
  Administered 2024-02-18: 84 mg via INTRAVENOUS
  Filled 2024-02-18: qty 14

## 2024-02-18 MED ORDER — FAMOTIDINE IN NACL 20-0.9 MG/50ML-% IV SOLN
20.0000 mg | Freq: Once | INTRAVENOUS | Status: AC
  Administered 2024-02-18: 20 mg via INTRAVENOUS
  Filled 2024-02-18: qty 50

## 2024-02-18 MED ORDER — IRON SUCROSE 20 MG/ML IV SOLN
200.0000 mg | Freq: Once | INTRAVENOUS | Status: AC
  Administered 2024-02-18: 200 mg via INTRAVENOUS

## 2024-02-18 MED ORDER — DIPHENHYDRAMINE HCL 50 MG/ML IJ SOLN
50.0000 mg | Freq: Once | INTRAMUSCULAR | Status: AC
  Administered 2024-02-18: 50 mg via INTRAVENOUS
  Filled 2024-02-18: qty 1

## 2024-02-18 NOTE — Patient Instructions (Signed)
 [facility] - A DEPT OF [facility]  Discharge Instructions: Thank you for choosing [facility] to provide your oncology and hematology care.  If you have a lab appointment with the Cancer Center, please g

## 2024-02-18 NOTE — Progress Notes (Signed)
 Pt in for follow up and treatment. Denies any concerns today.

## 2024-02-18 NOTE — Progress Notes (Signed)
 [facility] CONSULT NOTE  Patient Care Team: Center, [facility] as PCP - General (General Practice) Cindie Jesusa HERO, RN as Registered Nurse Dannielle, Arlean FALCON, RN (Inactive) as Registered Nurse Agrawal, Kavita, MD as Consulting Physician (Oncology) Verdene Gills, RN as Oncology Nurse Navigator Lenn Aran, MD as Consulting Physician (Radiation Oncology)  Reason for visit- right upper lobe lung non-small cell cancer  CANCER STAGING   Cancer Staging  Cancer of upper lobe of right lung [facility]) Staging form: Lung, AJCC V9 - Clinical stage from 11/12/2023: Stage IIIA (cT3, cN1, cM0) - Signed by Agrawal, Kavita, MD on 01/07/2024  ASSESSMENT & PLAN:  Michele House 68 y.o. 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  # RUL NSCLC, Stage IIIA - Status post EBUS with biopsy with Dr.Aleskerov. Right upper lobe lung biopsy showed non-small cell carcinoma. Coexpress is TTF-1 and p40.  With TTF-1 expression, could be adenocarcinoma. Insufficient material for ancillary molecular testing. RUL bronchial lavage positive for malignancy. Station 7 lymph node negative for malignancy.  - PET scan from April 22, 2024 showed 1.8 cm nodule in the right upper lobe with SUV of 37.5. 6 mm nodule more superiorly hypermetabolic for size could be satellite lesion.  There are several small hypermetabolic right hilar lymph nodes largest with SUV of 16.  No evidence of metastatic disease otherwise.  - MRI brain with and without contrast negative for metastasis.   - Liquid Tempus testing 01/07/24- no pathogenic variants were found. ctDNA < 0.25%. bTMB 4.4 m/MB. MSI-H not detected. VUS of ASXL1, PRKN, ATM, NOTCH2, ARAF  - Started on radiation on 01/31/2024. Labs reviewed and acceptable for treatment. Proceed with cycle 3 of carboplatin  AUC 2 and taxol  45  mg/m2.   # COPD on 2 L oxygen - Stable. Continue follow-up with pulmonary  # Iron  deficiency anemia -Hemoglobin 10.6.  Ferritin 37 and saturation 10%. - Plan for venofer  200 mg weekly. She received first dose on 02/11/24 and tolerated well - Plan for venofer  #2 with treatment today.   # History of GI bleed -s/p EGD which showed bleeding duodenal ulcer with clip placed 10/28/2023.  - s/p IR embolization on 10/30/2023.  Plavix  was discontinued.  She was continued on baby aspirin .  # History of DVT - Had bilateral lower extremity Dopplers for leg pain.  Showed right calf DVT.   - Status post IVC filter on 11/09/2023.  # History of CVA - ongoing right sided weakness. Using a lift to position for treatment.  - incontinent at baseline.  - resides at nursing facility.   # Goals of care - treatment given with curative intent  Cycle 3 carbo-taxol  & venofer  today Rtc in 1 week for port/labs, Dr. Clista, +/- carbo-taxol  & venofer - la  No orders of the defined types were placed in this encounter.  The total time spent in the appointment was 30 minutes encounter with patients including review of chart and various tests results, discussions about plan of care and coordination of care plan   All questions were answered. The patient knows to call the clinic with any problems, questions or concerns. No barriers to learning was detected.  Tinnie KANDICE Dawn, NP 02/18/2024   HISTORY OF PRESENTING ILLNESS:  Michele House 68 y.o. 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  Patient had long complicated hospital course from 10/02/2023 to 11/15/2023.  Presented with COPD exacerbation, pneumonia, UTI and encephalopathy.  Requiring intubation.   CTA chest from 09/24/2023 showed increase in size of spiculated pulmonary nodule in the inferior right upper lobe  1.6 cm compared to 1.3 cm previously.  Initial lung biopsy on 10/20/2023 was nondiagnostic.    Repeat EBUS with biopsy with Dr.Aleskerov on 11/12/2023.  Right upper lobe lung biopsy showed non-small cell carcinoma.  Coexpress is TTF-1 and p40.  Insufficient material for ancillary molecular testing. RUL bronchial lavage positive for malignancy. Station 7 lymph node negative for malignancy.  Hospital course complicated by encephalopathy.  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  EEG showed diffuse slowing without seizures.  LP unremarkable.  Autoimmune/paraneoplastic panel sent.  Treated with 5 days of high-dose Solu-Medrol  with not much improvement.  Also developed GI bleed causing hemorrhagic shock. s/p EGD which showed bleeding duodenal ulcer with clip placed 10/28/2023. s/p IR embolization on 10/30/2023.  Plavix  was discontinued.  She was continued on baby aspirin .  Had bilateral lower extremity Dopplers for leg pain.  Showed right calf DVT.  Status post IVC filter on 11/09/2023.  Interval history Patient is 68 year old female who returns to clinic today accompanied by her daughter for consideration of chemotherapy. She continues concurrent carbo-taxol  and radiation. Tolerating well. Denies nausea with treatment this week. No diarrhea or constipation. She continues oxygen at baseline. Chronic right sided weakness post stroke.   Summary of oncologic history is as follows: Oncology History  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)  11/12/2023 Cancer Staging   Staging form: Lung, AJCC V9 - Clinical stage from 11/12/2023: Stage IIIA (cT3, cN1, cM0) - Signed by Clista Bimler, MD on 01/07/2024   12/17/2023 Initial Diagnosis   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)   02/04/2024 -  Chemotherapy   Patient is on  Treatment Plan : LUNG Carboplatin  + Paclitaxel  + XRT q7d       MEDICAL HISTORY:  Past Medical History:  Diagnosis Date   Acid reflux    COPD (chronic obstructive pulmonary disease) (HCC)    Diabetes mellitus without complication (HCC)    Hyperlipidemia    Hypertension    Thyroid  disease     SURGICAL HISTORY: Past Surgical History:  Procedure Laterality Date   appendectomy     APPENDECTOMY     BREAST BIOPSY Right    CORE W/CLIP - NEG   ESOPHAGOGASTRODUODENOSCOPY N/A 10/28/2023   Procedure: ESOPHAGOGASTRODUODENOSCOPY (EGD);  Surgeon: Jinny Carmine, MD;  Location: [facility] ENDOSCOPY;  Service: Endoscopy;  Laterality: N/A;   FLEXIBLE BRONCHOSCOPY Bilateral 11/12/2023   Procedure: FLEXIBLE BRONCHOSCOPY;  Surgeon: Parris Manna, MD;  Location: [facility] ORS;  Service: Thoracic;  Laterality: Bilateral;   IR EMBO ART  VEN HEMORR LYMPH EXTRAV  INC GUIDE ROADMAPPING  10/30/2023   IR IMAGING GUIDED PORT INSERTION  01/18/2024   IVC FILTER INSERTION N/A 11/09/2023   Procedure: IVC FILTER INSERTION;  Surgeon: Marea Selinda RAMAN, MD;  Location: [facility] INVASIVE CV LAB;  Service: Cardiovascular;  Laterality: N/A;   TOTAL VAGINAL HYSTERECTOMY     TUMOR REMOVAL     benign;stomach   VIDEO BRONCHOSCOPY WITH ENDOBRONCHIAL NAVIGATION Right 10/20/2023   Procedure: VIDEO BRONCHOSCOPY WITH  ENDOBRONCHIAL NAVIGATION;  Surgeon: Parris Manna, MD;  Location: [facility] ORS;  Service: Thoracic;  Laterality: Right;   VIDEO BRONCHOSCOPY WITH ENDOBRONCHIAL NAVIGATION Right 11/12/2023   Procedure: VIDEO BRONCHOSCOPY WITH ENDOBRONCHIAL NAVIGATION;  Surgeon: Parris Manna, MD;  Location: [facility] ORS;  Service: Thoracic;  Laterality: Right;    SOCIAL HISTORY: Social History   Socioeconomic History   Marital status: Single    Spouse name: Not on file   Number of children: Not on file   Years of education: Not on file   Highest education level: Not on file  Occupational History   Not on file  Tobacco Use   Smoking status:  Former    Current packs/day: 0.00    Types: Cigarettes    Quit date: 12/15/1985    Years since quitting: 38.2   Smokeless tobacco: Never  Substance and Sexual Activity   Alcohol use: No   Drug use: No   Sexual activity: Not Currently  Other Topics Concern   Not on file  Social History Narrative   Not on file   Social Drivers of Health   Financial Resource Strain: Not on file  Food Insecurity: Patient Unable To Answer (10/03/2023)   Hunger Vital Sign    Worried About Running Out of Food in the Last Year: Patient unable to answer    Ran Out of Food in the Last Year: Patient unable to answer  Recent Concern: Food Insecurity - Food Insecurity Present (09/24/2023)   Hunger Vital Sign    Worried About Running Out of Food in the Last Year: Sometimes true    Ran Out of Food in the Last Year: Sometimes true  Transportation Needs: Patient Unable To Answer (10/03/2023)   PRAPARE - Transportation    Lack of Transportation (Medical): Patient unable to answer    Lack of Transportation (Non-Medical): Patient unable to answer  Recent Concern: Transportation Needs - Unmet Transportation Needs (09/24/2023)   PRAPARE - Administrator, Civil Service (Medical): Yes    Lack of Transportation (Non-Medical): Yes  Physical Activity: Not on file  Stress: Not on file  Social Connections: Patient Declined (11/02/2023)   Social Connection and Isolation Panel [NHANES]    Frequency of Communication with Friends and Family: Patient declined    Frequency of Social Gatherings with Friends and Family: Patient declined    Attends Religious Services: Patient declined    Database Administrator or Organizations: Patient declined    Attends Banker Meetings: Patient declined    Marital Status: Patient declined  Intimate Partner Violence: Patient Unable To Answer (10/03/2023)   Humiliation, Afraid, Rape, and Kick questionnaire    Fear of Current or Ex-Partner: Patient unable to answer     Emotionally Abused: Patient unable to answer    Physically Abused: Patient unable to answer    Sexually Abused: Patient unable to answer    FAMILY HISTORY: Family History  Problem Relation Age of Onset   Heart attack Mother    Hypertension Mother    Breast cancer Sister 22   Breast cancer Maternal Aunt        40'S   Breast cancer Maternal Grandmother     ALLERGIES:  is allergic to penicillins, aspirin , and sulfa antibiotics.  MEDICATIONS:  Current Outpatient Medications  Medication Sig Dispense Refill   acetaminophen  (TYLENOL ) 325 MG tablet Take 650 mg by mouth every 6 (six) hours as needed.     aspirin  EC 81 MG tablet Take 81 mg by mouth  daily.     atorvastatin  (LIPITOR) 20 MG tablet Take 20 mg by mouth daily.     calcium  carbonate (TUMS - DOSED IN MG ELEMENTAL CALCIUM ) 500 MG chewable tablet Chew 1 tablet by mouth daily.     cetirizine (ZYRTEC) 10 MG tablet Take 10 mg by mouth daily.     COMBIVENT  RESPIMAT 20-100 MCG/ACT AERS respimat Inhale into the lungs every 6 (six) hours as needed for wheezing or shortness of breath.     dexamethasone  (DECADRON ) 4 MG tablet Take 2 tablets daily for 2 days, start the day after chemotherapy. Take with food. 30 tablet 1   diclofenac sodium (VOLTAREN) 1 % GEL Apply topically 4 (four) times daily.     docusate sodium  (COLACE) 100 MG capsule Take 1 capsule (100 mg total) by mouth 2 (two) times daily.     DULERA 200-5 MCG/ACT AERO Inhale 2 puffs into the lungs every 12 (twelve) hours.     furosemide  (LASIX ) 20 MG tablet Take 1 tablet (20 mg total) by mouth daily. 30 tablet 0   glipiZIDE  (GLUCOTROL ) 10 MG tablet Take 10 mg by mouth daily before breakfast.     insulin  aspart (NOVOLOG ) 100 UNIT/ML injection Inject 4 Units into the skin 3 (three) times daily with meals.     insulin  glargine-yfgn (SEMGLEE ) 100 UNIT/ML injection Inject 0.18 mLs (18 Units total) into the skin daily.     ipratropium (ATROVENT HFA) 17 MCG/ACT inhaler Inhale 2 puffs into the  lungs every 6 (six) hours.     levETIRAcetam  (KEPPRA ) 500 MG tablet Take 1 tablet (500 mg total) by mouth 2 (two) times daily.     levothyroxine  (SYNTHROID ) 150 MCG tablet Take 1 tablet (150 mcg total) by mouth daily before breakfast. Increased from 100.     lidocaine -prilocaine  (EMLA ) cream Apply to affected area once 30 g 3   LORazepam  (ATIVAN ) 0.5 MG tablet Take 0.5 mg by mouth every 8 (eight) hours as needed for anxiety.     Multiple Vitamin (MULTIVITAMIN) capsule Take 1 capsule by mouth daily.     ondansetron  (ZOFRAN ) 8 MG tablet Take 1 tablet (8 mg total) by mouth every 8 (eight) hours as needed for nausea or vomiting. Start on the third day after chemotherapy. 30 tablet 1   pantoprazole  (PROTONIX ) 40 MG tablet Take 1 tablet (40 mg total) by mouth 2 (two) times daily before a meal.     pregabalin  (LYRICA ) 50 MG capsule Take 50 mg by mouth 2 (two) times daily.     prochlorperazine  (COMPAZINE ) 10 MG tablet Take 1 tablet (10 mg total) by mouth every 6 (six) hours as needed for nausea or vomiting. 30 tablet 1   rOPINIRole  (REQUIP  XL) 4 MG 24 hr tablet Take 1 tablet (4 mg total) by mouth at bedtime.     sertraline  (ZOLOFT ) 100 MG tablet Take 100 mg by mouth daily.     TRADJENTA  5 MG TABS tablet Take 5 mg by mouth daily.     dextromethorphan -guaiFENesin  (MUCINEX  DM) 30-600 MG 12hr tablet Take 1 tablet by mouth 2 (two) times daily as needed for cough. (Patient not taking: Reported on 02/18/2024) 30 tablet 0   [Paused] lisinopril -hydrochlorothiazide  (PRINZIDE ,ZESTORETIC ) 20-25 MG per tablet Take 1 tablet by mouth daily. (Patient not taking: Reported on 01/17/2024)     loperamide (IMODIUM A-D) 2 MG tablet Take 2 mg by mouth 4 (four) times daily as needed for diarrhea or loose stools. (Patient not taking: Reported on 02/18/2024)  naloxone (NARCAN) nasal spray 4 mg/0.1 mL Place 1 spray into the nose. (Patient not taking: Reported on 02/18/2024)     ondansetron  (ZOFRAN ) 4 MG/5ML solution Take by mouth  once. (Patient not taking: Reported on 02/18/2024)     polyethylene glycol (MIRALAX  / GLYCOLAX ) 17 g packet Take 17 g by mouth daily. (Patient not taking: Reported on 02/18/2024)     No current facility-administered medications for this visit.    REVIEW OF SYSTEMS:   Pertinent information mentioned in HPI All other systems were reviewed with the patient and are negative.  PHYSICAL EXAMINATION: ECOG PERFORMANCE STATUS: 3 - Symptomatic, >50% confined to bed  Vitals:   02/18/24 0908  BP: 114/75  Pulse: 99  Resp: 16  Temp: 97.6 F (36.4 C)  SpO2: 99%   Filed Weights   General: Well-developed, well-nourished, no acute distress. In wheelchair Eyes: Pink conjunctiva, anicteric sclera. Lungs: Diminished bilaterally. No audible wheezing or coughing. On oxygen 2 L via [place]. Heart: Regular rate and rhythm.  Abdomen: Soft, nontender, nondistended.  Musculoskeletal: No edema, cyanosis, or clubbing. Chronic neuropathy of feet. Neuro: Alert, answering all questions appropriately. Chronic right sided weakness Skin: No rashes or petechiae noted. Psych: Normal affect.   LABORATORY DATA:  I have reviewed the data as listed Lab Results  Component Value Date   WBC 4.7 02/18/2024   HGB 10.6 (L) 02/18/2024   HCT 34.2 (L) 02/18/2024   MCV 79.4 (L) 02/18/2024   PLT 231 02/18/2024   Recent Labs    02/04/24 0905 02/11/24 0830 02/18/24 0800  NA 140 139 140  K 3.6 3.5 3.4*  CL 101 101 101  CO2 32 30 31  GLUCOSE 136* 181* 124*  BUN 14 18 14   CREATININE 0.51 0.43* 0.64  CALCIUM  9.5 9.3 9.3  GFRNONAA >60 >60 >60  PROT 6.7 6.2* 6.2*  ALBUMIN 3.4* 3.5 3.5  AST 17 13* 16  ALT 15 13 12   ALKPHOS 93 79 77  BILITOT 0.4 0.5 0.5    RADIOGRAPHIC STUDIES: I have personally reviewed the radiological images as listed and agreed with the findings in the report. No results found.

## 2024-02-21 ENCOUNTER — Other Ambulatory Visit: Payer: Self-pay

## 2024-02-21 ENCOUNTER — Ambulatory Visit
Admission: RE | Admit: 2024-02-21 | Discharge: 2024-02-21 | Disposition: A | Discharge status: Home / Self Care | Source: Ambulatory Visit | Attending: Radiation Oncology | Admitting: Radiation Oncology

## 2024-02-21 DIAGNOSIS — Z510 Encounter for antineoplastic radiation therapy: Secondary | ICD-10-CM | POA: Diagnosis not present

## 2024-02-21 LAB — RAD ONC ARIA SESSION SUMMARY
Course Elapsed Days: 21
Plan Fractions Treated to Date: 16
Plan Prescribed Dose Per Fraction: 2 Gy
Plan Total Fractions Prescribed: 33
Plan Total Prescribed Dose: 66 Gy
Reference Point Dosage Given to Date: 32 Gy
Reference Point Session Dosage Given: 2 Gy
Session Number: 16

## 2024-02-22 ENCOUNTER — Ambulatory Visit
Admission: RE | Admit: 2024-02-22 | Discharge: 2024-02-22 | Disposition: A | Discharge status: Home / Self Care | Source: Ambulatory Visit | Attending: Radiation Oncology | Admitting: Radiation Oncology

## 2024-02-22 ENCOUNTER — Other Ambulatory Visit: Payer: Self-pay

## 2024-02-22 DIAGNOSIS — Z510 Encounter for antineoplastic radiation therapy: Secondary | ICD-10-CM | POA: Diagnosis not present

## 2024-02-22 LAB — RAD ONC ARIA SESSION SUMMARY
Course Elapsed Days: 22
Plan Fractions Treated to Date: 17
Plan Prescribed Dose Per Fraction: 2 Gy
Plan Total Fractions Prescribed: 33
Plan Total Prescribed Dose: 66 Gy
Reference Point Dosage Given to Date: 34 Gy
Reference Point Session Dosage Given: 2 Gy
Session Number: 17

## 2024-02-22 NOTE — Addendum Note (Signed)
 Encounter addended by: Karolynn Pack on: 02/22/2024 8:22 AM  Actions taken: Imaging Exam ended

## 2024-02-23 ENCOUNTER — Other Ambulatory Visit: Payer: Self-pay

## 2024-02-23 ENCOUNTER — Ambulatory Visit
Admission: RE | Admit: 2024-02-23 | Discharge: 2024-02-23 | Disposition: A | Discharge status: Home / Self Care | Source: Ambulatory Visit | Attending: Radiation Oncology | Admitting: Radiation Oncology

## 2024-02-23 DIAGNOSIS — Z510 Encounter for antineoplastic radiation therapy: Secondary | ICD-10-CM | POA: Diagnosis not present

## 2024-02-23 LAB — RAD ONC ARIA SESSION SUMMARY
Course Elapsed Days: 23
Plan Fractions Treated to Date: 18
Plan Prescribed Dose Per Fraction: 2 Gy
Plan Total Fractions Prescribed: 33
Plan Total Prescribed Dose: 66 Gy
Reference Point Dosage Given to Date: 36 Gy
Reference Point Session Dosage Given: 2 Gy
Session Number: 18

## 2024-02-24 ENCOUNTER — Ambulatory Visit
Admission: RE | Admit: 2024-02-24 | Discharge: 2024-02-24 | Disposition: A | Discharge status: Home / Self Care | Source: Ambulatory Visit | Attending: Radiation Oncology | Admitting: Radiation Oncology

## 2024-02-24 ENCOUNTER — Other Ambulatory Visit: Payer: Self-pay

## 2024-02-24 DIAGNOSIS — Z510 Encounter for antineoplastic radiation therapy: Secondary | ICD-10-CM | POA: Diagnosis not present

## 2024-02-24 LAB — RAD ONC ARIA SESSION SUMMARY
Course Elapsed Days: 24
Plan Fractions Treated to Date: 19
Plan Prescribed Dose Per Fraction: 2 Gy
Plan Total Fractions Prescribed: 33
Plan Total Prescribed Dose: 66 Gy
Reference Point Dosage Given to Date: 38 Gy
Reference Point Session Dosage Given: 2 Gy
Session Number: 19

## 2024-02-25 ENCOUNTER — Inpatient Hospital Stay (HOSPITAL_BASED_OUTPATIENT_CLINIC_OR_DEPARTMENT_OTHER): Admitting: Internal Medicine

## 2024-02-25 ENCOUNTER — Inpatient Hospital Stay

## 2024-02-25 ENCOUNTER — Other Ambulatory Visit: Payer: Self-pay

## 2024-02-25 ENCOUNTER — Ambulatory Visit
Admission: RE | Admit: 2024-02-25 | Discharge: 2024-02-25 | Disposition: A | Discharge status: Home / Self Care | Source: Ambulatory Visit | Attending: Radiation Oncology | Admitting: Radiation Oncology

## 2024-02-25 ENCOUNTER — Encounter: Payer: Self-pay | Admitting: Internal Medicine

## 2024-02-25 VITALS — BP 121/76 | HR 76

## 2024-02-25 VITALS — BP 129/78 | HR 78 | Temp 97.6°F | Resp 14

## 2024-02-25 DIAGNOSIS — Z5111 Encounter for antineoplastic chemotherapy: Secondary | ICD-10-CM

## 2024-02-25 DIAGNOSIS — M79604 Pain in right leg: Secondary | ICD-10-CM

## 2024-02-25 DIAGNOSIS — C3411 Malignant neoplasm of upper lobe, right bronchus or lung: Secondary | ICD-10-CM

## 2024-02-25 DIAGNOSIS — D509 Iron deficiency anemia, unspecified: Secondary | ICD-10-CM | POA: Diagnosis not present

## 2024-02-25 DIAGNOSIS — Z510 Encounter for antineoplastic radiation therapy: Secondary | ICD-10-CM | POA: Diagnosis not present

## 2024-02-25 DIAGNOSIS — M79605 Pain in left leg: Secondary | ICD-10-CM

## 2024-02-25 LAB — RAD ONC ARIA SESSION SUMMARY
Course Elapsed Days: 25
Plan Fractions Treated to Date: 20
Plan Prescribed Dose Per Fraction: 2 Gy
Plan Total Fractions Prescribed: 33
Plan Total Prescribed Dose: 66 Gy
Reference Point Dosage Given to Date: 40 Gy
Reference Point Session Dosage Given: 2 Gy
Session Number: 20

## 2024-02-25 LAB — CMP (CANCER CENTER ONLY)
ALT: 13 U/L (ref 0–44)
AST: 17 U/L (ref 15–41)
Albumin: 3.4 g/dL — ABNORMAL LOW (ref 3.5–5.0)
Alkaline Phosphatase: 80 U/L (ref 38–126)
Anion gap: 6 (ref 5–15)
BUN: 16 mg/dL (ref 8–23)
CO2: 32 mmol/L (ref 22–32)
Calcium: 9.3 mg/dL (ref 8.9–10.3)
Chloride: 104 mmol/L (ref 98–111)
Creatinine: 0.5 mg/dL (ref 0.44–1.00)
GFR, Estimated: >60 mL/min (ref >60)
Glucose, Bld: 175 mg/dL — ABNORMAL HIGH (ref 70–99)
Potassium: 3.5 mmol/L (ref 3.5–5.1)
Sodium: 142 mmol/L (ref 135–145)
Total Bilirubin: 0.2 mg/dL (ref 0.0–1.2)
Total Protein: 6.3 g/dL — ABNORMAL LOW (ref 6.5–8.1)

## 2024-02-25 LAB — CBC WITH DIFFERENTIAL (CANCER CENTER ONLY)
Abs Immature Granulocytes: 0.03 10*3/uL (ref 0.00–0.07)
Basophils Absolute: 0 10*3/uL (ref 0.0–0.1)
Basophils Relative: 0 %
Eosinophils Absolute: 0 10*3/uL (ref 0.0–0.5)
Eosinophils Relative: 0 %
HCT: 33.7 % — ABNORMAL LOW (ref 36.0–46.0)
Hemoglobin: 10.5 g/dL — ABNORMAL LOW (ref 12.0–15.0)
Immature Granulocytes: 1 %
Lymphocytes Relative: 14 %
Lymphs Abs: 0.4 10*3/uL — ABNORMAL LOW (ref 0.7–4.0)
MCH: 24.8 pg — ABNORMAL LOW (ref 26.0–34.0)
MCHC: 31.2 g/dL (ref 30.0–36.0)
MCV: 79.5 fL — ABNORMAL LOW (ref 80.0–100.0)
Monocytes Absolute: 0.3 10*3/uL (ref 0.1–1.0)
Monocytes Relative: 9 %
Neutro Abs: 2.1 10*3/uL (ref 1.7–7.7)
Neutrophils Relative %: 76 %
Platelet Count: 166 10*3/uL (ref 150–400)
RBC: 4.24 MIL/uL (ref 3.87–5.11)
RDW: 16.6 % — ABNORMAL HIGH (ref 11.5–15.5)
WBC Count: 2.8 10*3/uL — ABNORMAL LOW (ref 4.0–10.5)
nRBC: 0 % (ref 0.0–0.2)

## 2024-02-25 MED ORDER — HEPARIN SOD (PORK) LOCK FLUSH 100 UNIT/ML IV SOLN
500.0000 [IU] | Freq: Once | INTRAVENOUS | Status: AC | PRN
  Administered 2024-02-25: 500 [IU]
  Filled 2024-02-25: qty 5

## 2024-02-25 MED ORDER — SODIUM CHLORIDE 0.9 % IV SOLN
45.0000 mg/m2 | Freq: Once | INTRAVENOUS | Status: AC
  Administered 2024-02-25: 84 mg via INTRAVENOUS
  Filled 2024-02-25: qty 14

## 2024-02-25 MED ORDER — SODIUM CHLORIDE 0.9 % IV SOLN
INTRAVENOUS | Status: DC
Start: 2024-02-25 — End: 2024-02-25
  Filled 2024-02-25: qty 250

## 2024-02-25 MED ORDER — IRON SUCROSE 20 MG/ML IV SOLN
200.0000 mg | Freq: Once | INTRAVENOUS | Status: AC
  Administered 2024-02-25: 200 mg via INTRAVENOUS
  Filled 2024-02-25: qty 10

## 2024-02-25 MED ORDER — FAMOTIDINE IN NACL 20-0.9 MG/50ML-% IV SOLN
20.0000 mg | Freq: Once | INTRAVENOUS | Status: AC
  Administered 2024-02-25: 20 mg via INTRAVENOUS
  Filled 2024-02-25: qty 50

## 2024-02-25 MED ORDER — DEXAMETHASONE SODIUM PHOSPHATE 10 MG/ML IJ SOLN
10.0000 mg | Freq: Once | INTRAMUSCULAR | Status: AC
  Administered 2024-02-25: 10 mg via INTRAVENOUS
  Filled 2024-02-25: qty 1

## 2024-02-25 MED ORDER — PALONOSETRON HCL INJECTION 0.25 MG/5ML
0.2500 mg | Freq: Once | INTRAVENOUS | Status: AC
  Administered 2024-02-25: 0.25 mg via INTRAVENOUS
  Filled 2024-02-25: qty 5

## 2024-02-25 MED ORDER — DIPHENHYDRAMINE HCL 50 MG/ML IJ SOLN
50.0000 mg | Freq: Once | INTRAMUSCULAR | Status: AC
  Administered 2024-02-25: 50 mg via INTRAVENOUS
  Filled 2024-02-25: qty 1

## 2024-02-25 MED ORDER — OXYCODONE HCL 5 MG PO TABS: 5.0000 mg | ORAL_TABLET | Freq: Once | ORAL | Status: AC

## 2024-02-25 MED ORDER — SODIUM CHLORIDE 0.9% FLUSH
10.0000 mL | Freq: Once | INTRAVENOUS | Status: AC
  Administered 2024-02-25: 10 mL via INTRAVENOUS
  Filled 2024-02-25: qty 10

## 2024-02-25 MED ORDER — SODIUM CHLORIDE 0.9 % IV SOLN
178.2000 mg | Freq: Once | INTRAVENOUS | Status: AC
  Administered 2024-02-25: 180 mg via INTRAVENOUS
  Filled 2024-02-25: qty 18

## 2024-02-25 MED ORDER — OXYCODONE HCL 5 MG PO TABS
5.0000 mg | ORAL_TABLET | Freq: Once | ORAL | Status: AC
  Administered 2024-02-25: 5 mg via ORAL
  Filled 2024-02-25: qty 1

## 2024-02-25 NOTE — Addendum Note (Signed)
 Addended by: Kameko Hukill on: 02/25/2024 11:11 AM   Modules accepted: Orders

## 2024-02-25 NOTE — Patient Instructions (Signed)

## 2024-02-25 NOTE — Progress Notes (Signed)
 Patient is doing ok with treatments, she has two more weeks of treatment and she is finished, she is still staying in the facility [facility]. She is forgetting things faster and more often, which assured them both that while on chemo, having strokes, living where she is now is making her more stressed which can cause her to have some forget fullness, then the daughter tells me that before her stroke she was diagnosed with eraly onset dementia. No new questions for the doctor today.

## 2024-02-25 NOTE — Progress Notes (Signed)
 [facility] CONSULT NOTE  Patient Care Team: Center, [facility] as PCP - General (General Practice) Burnie Cartwright, RN as Registered Nurse Arlette Benders, RN (Inactive) as Registered Nurse Harmani Neto, MD as Consulting Physician (Oncology) Drake Gens, RN as Oncology Nurse Navigator Glenis Langdon, MD as Consulting Physician (Radiation Oncology)  Reason for visit- right upper lobe lung non-small cell cancer.  CANCER STAGING   Cancer Staging  Cancer of upper lobe of right lung [facility]) Staging form: Lung, AJCC V9 - Clinical stage from 11/12/2023: Stage IIIA (cT3, cN1, cM0) - Signed by Demarcus Thielke, MD on 01/07/2024   ASSESSMENT & PLAN:  Michele House 68 y.o. 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  # RUL NSCLC, Stage IIIA - Status post EBUS with biopsy with Dr.Aleskerov.  Right upper lobe lung biopsy showed non-small cell carcinoma.  Coexpress is TTF-1 and p40.  With TTF-1 expression, could be adenocarcinoma.  Insufficient material for ancillary molecular testing. RUL bronchial lavage positive for malignancy. Station 7 lymph node negative for malignancy.  -PET scan from April 22, 2024 showed 1.8 cm nodule in the right upper lobe with SUV of 37.5.  6 mm nodule more superiorly hypermetabolic for size could be satellite lesion.  There are several small hypermetabolic right hilar lymph nodes largest with SUV of 16.  No evidence of metastatic disease otherwise.  -Started on radiation on 01/31/2024.  Labs reviewed and acceptable for treatment.  WBC 2.8 noted.  However ANC is at 2.1.  Will proceed with cycle 4 of carboplatin  AUC 2 and Taxol  45 mg/m2.   -MRI brain with and without contrast negative for metastasis.   - Liquid Tempus CT DNA less than 0.25%.  TMB 4.4, MSI-S.  No targetable mutations.  # COPD on 2  L oxygen -Continue follow-up with pulmonary  # Iron  deficiency anemia -Hemoglobin 10.6.  Ferritin 37 and saturation 10%. -Schedule for IV Venofer  200 mg weekly x 5 doses.  # History of GI bleed -s/p EGD which showed bleeding duodenal ulcer with clip placed 10/28/2023.  - s/p IR embolization on 10/30/2023.  Plavix  was discontinued.  She was continued on baby aspirin .  # History of DVT - Had bilateral lower extremity Dopplers for leg pain.  Showed right calf DVT.   - Status post IVC filter on 11/09/2023.  RTC in 1 week with MD, labs, CarboTaxol, Venofer   Orders Placed This Encounter  Procedures   CBC with Differential (Cancer Center Only)    Standing Status:   Future    Expected Date:   03/10/2024    Expiration Date:   03/10/2025   CMP (Cancer Center only)    Standing Status:   Future    Expected Date:   03/10/2024    Expiration Date:   03/10/2025    The total time spent in the appointment was 30 minutes encounter with patients including review of chart and various tests results, discussions about plan of care and coordination of care plan   All questions were answered. The patient knows to call the clinic with any problems, questions or concerns. No barriers to learning was detected.  Loreatha Rodney, MD 4/25/202510:43 AM   HISTORY OF PRESENTING ILLNESS:  Michele House 68 y.o. 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  Patient had long complicated hospital course from 10/02/2023 to 11/15/2023.  Presented with COPD exacerbation, pneumonia, UTI and encephalopathy.  Requiring intubation.   CTA chest from 09/24/2023 showed increase in size of spiculated pulmonary nodule in the inferior right upper lobe 1.6 cm compared to 1.3 cm previously.  Initial lung biopsy on 10/20/2023 was nondiagnostic.    Repeat EBUS with biopsy with Dr.Aleskerov on  11/12/2023.  Right upper lobe lung biopsy showed non-small cell carcinoma.  Coexpress is TTF-1 and p40.  Insufficient material for ancillary molecular testing. RUL bronchial lavage positive for malignancy. Station 7 lymph node negative for malignancy.  Hospital course complicated by encephalopathy.  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  EEG showed diffuse slowing without seizures.  LP unremarkable.  Autoimmune/paraneoplastic panel sent.  Treated with 5 days of high-dose Solu-Medrol  with not much improvement.  Also developed GI bleed causing hemorrhagic shock. s/p EGD which showed bleeding duodenal ulcer with clip placed 10/28/2023. s/p IR embolization on 10/30/2023.  Plavix  was discontinued.  She was continued on baby aspirin .  Had bilateral lower extremity Dopplers for leg pain.  Showed right calf DVT.  Status post IVC filter on 11/09/2023.  Interval history Patient was seen today in the clinic accompanied with daughter.  On concurrent chemo RT for stage IIIa lung cancer. She is tolerating treatment well overall.  Denies any nausea, vomiting denies any changes with the bowel movements.  Appetite is good.  I have reviewed her chart and materials related to her cancer extensively and collaborated history with the patient. Summary of oncologic history is as follows: Oncology History  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)  11/12/2023 Cancer Staging   Staging form: Lung, AJCC V9 - Clinical stage from 11/12/2023: Stage IIIA (cT3, cN1, cM0) - Signed by Loreatha Rodney, MD on 01/07/2024   12/17/2023 Initial Diagnosis   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)   02/04/2024 -  Chemotherapy   Patient is on Treatment Plan : LUNG Carboplatin  + Paclitaxel  + XRT q7d       MEDICAL HISTORY:  Past Medical History:   Diagnosis Date   Acid reflux    COPD (chronic obstructive pulmonary disease) (HCC)    Diabetes mellitus without complication (HCC)    Hyperlipidemia    Hypertension    Thyroid  disease     SURGICAL HISTORY: Past Surgical History:  Procedure Laterality Date   appendectomy     APPENDECTOMY     BREAST BIOPSY Right    CORE W/CLIP - NEG   ESOPHAGOGASTRODUODENOSCOPY N/A 10/28/2023   Procedure: ESOPHAGOGASTRODUODENOSCOPY (EGD);  Surgeon: Marnee Sink, MD;  Location: [facility] ENDOSCOPY;  Service: Endoscopy;  Laterality: N/A;   FLEXIBLE BRONCHOSCOPY Bilateral 11/12/2023   Procedure: FLEXIBLE BRONCHOSCOPY;  Surgeon: Erskin Hearing, MD;  Location: [facility] ORS;  Service: Thoracic;  Laterality: Bilateral;   IR EMBO ART  VEN HEMORR LYMPH EXTRAV  INC GUIDE ROADMAPPING  10/30/2023   IR IMAGING GUIDED PORT INSERTION  01/18/2024   IVC FILTER INSERTION N/A 11/09/2023   Procedure: IVC FILTER INSERTION;  Surgeon: Celso College, MD;  Location: [facility] INVASIVE CV LAB;  Service: Cardiovascular;  Laterality: N/A;   TOTAL VAGINAL HYSTERECTOMY     TUMOR REMOVAL     benign;stomach   VIDEO BRONCHOSCOPY WITH ENDOBRONCHIAL NAVIGATION  Right 10/20/2023   Procedure: VIDEO BRONCHOSCOPY WITH ENDOBRONCHIAL NAVIGATION;  Surgeon: Erskin Hearing, MD;  Location: [facility] ORS;  Service: Thoracic;  Laterality: Right;   VIDEO BRONCHOSCOPY WITH ENDOBRONCHIAL NAVIGATION Right 11/12/2023   Procedure: VIDEO BRONCHOSCOPY WITH ENDOBRONCHIAL NAVIGATION;  Surgeon: Erskin Hearing, MD;  Location: [facility] ORS;  Service: Thoracic;  Laterality: Right;    SOCIAL HISTORY: Social History   Socioeconomic History   Marital status: Single    Spouse name: Not on file   Number of children: Not on file   Years of education: Not on file   Highest education level: Not on file  Occupational History   Not on file  Tobacco Use   Smoking status: Former    Current packs/day: 0.00    Types: Cigarettes    Quit date: 12/15/1985    Years since quitting: 38.2    Smokeless tobacco: Never  Substance and Sexual Activity   Alcohol use: No   Drug use: No   Sexual activity: Not Currently  Other Topics Concern   Not on file  Social History Narrative   Not on file   Social Drivers of Health   Financial Resource Strain: Not on file  Food Insecurity: Patient Unable To Answer (10/03/2023)   Hunger Vital Sign    Worried About Running Out of Food in the Last Year: Patient unable to answer    Ran Out of Food in the Last Year: Patient unable to answer  Recent Concern: Food Insecurity - Food Insecurity Present (09/24/2023)   Hunger Vital Sign    Worried About Running Out of Food in the Last Year: Sometimes true    Ran Out of Food in the Last Year: Sometimes true  Transportation Needs: Patient Unable To Answer (10/03/2023)   PRAPARE - Transportation    Lack of Transportation (Medical): Patient unable to answer    Lack of Transportation (Non-Medical): Patient unable to answer  Recent Concern: Transportation Needs - Unmet Transportation Needs (09/24/2023)   PRAPARE - Administrator, Civil Service (Medical): Yes    Lack of Transportation (Non-Medical): Yes  Physical Activity: Not on file  Stress: Not on file  Social Connections: Patient Declined (11/02/2023)   Social Connection and Isolation Panel [NHANES]    Frequency of Communication with Friends and Family: Patient declined    Frequency of Social Gatherings with Friends and Family: Patient declined    Attends Religious Services: Patient declined    Database administrator or Organizations: Patient declined    Attends Banker Meetings: Patient declined    Marital Status: Patient declined  Intimate Partner Violence: Patient Unable To Answer (10/03/2023)   Humiliation, Afraid, Rape, and Kick questionnaire    Fear of Current or Ex-Partner: Patient unable to answer    Emotionally Abused: Patient unable to answer    Physically Abused: Patient unable to answer    Sexually Abused:  Patient unable to answer    FAMILY HISTORY: Family History  Problem Relation Age of Onset   Heart attack Mother    Hypertension Mother    Breast cancer Sister 11   Breast cancer Maternal Aunt        40'S   Breast cancer Maternal Grandmother     ALLERGIES:  is allergic to penicillins, aspirin , and sulfa antibiotics.  MEDICATIONS:  Current Outpatient Medications  Medication Sig Dispense Refill   acetaminophen  (TYLENOL ) 325 MG tablet Take 650 mg by mouth every 6 (six) hours as needed.     aspirin  EC  81 MG tablet Take 81 mg by mouth daily.     atorvastatin  (LIPITOR) 20 MG tablet Take 20 mg by mouth daily.     calcium  carbonate (TUMS - DOSED IN MG ELEMENTAL CALCIUM ) 500 MG chewable tablet Chew 1 tablet by mouth daily.     cetirizine (ZYRTEC) 10 MG tablet Take 10 mg by mouth daily.     COMBIVENT  RESPIMAT 20-100 MCG/ACT AERS respimat Inhale into the lungs every 6 (six) hours as needed for wheezing or shortness of breath.     dexamethasone  (DECADRON ) 4 MG tablet Take 2 tablets daily for 2 days, start the day after chemotherapy. Take with food. 30 tablet 1   dextromethorphan -guaiFENesin  (MUCINEX  DM) 30-600 MG 12hr tablet Take 1 tablet by mouth 2 (two) times daily as needed for cough. (Patient not taking: Reported on 02/18/2024) 30 tablet 0   diclofenac sodium (VOLTAREN) 1 % GEL Apply topically 4 (four) times daily.     docusate sodium  (COLACE) 100 MG capsule Take 1 capsule (100 mg total) by mouth 2 (two) times daily.     DULERA 200-5 MCG/ACT AERO Inhale 2 puffs into the lungs every 12 (twelve) hours.     furosemide  (LASIX ) 20 MG tablet Take 1 tablet (20 mg total) by mouth daily. 30 tablet 0   glipiZIDE  (GLUCOTROL ) 10 MG tablet Take 10 mg by mouth daily before breakfast.     insulin  aspart (NOVOLOG ) 100 UNIT/ML injection Inject 4 Units into the skin 3 (three) times daily with meals.     insulin  glargine-yfgn (SEMGLEE ) 100 UNIT/ML injection Inject 0.18 mLs (18 Units total) into the skin daily.      ipratropium (ATROVENT HFA) 17 MCG/ACT inhaler Inhale 2 puffs into the lungs every 6 (six) hours.     levETIRAcetam  (KEPPRA ) 500 MG tablet Take 1 tablet (500 mg total) by mouth 2 (two) times daily.     levothyroxine  (SYNTHROID ) 150 MCG tablet Take 1 tablet (150 mcg total) by mouth daily before breakfast. Increased from 100.     lidocaine -prilocaine  (EMLA ) cream Apply to affected area once 30 g 3   [Paused] lisinopril -hydrochlorothiazide  (PRINZIDE ,ZESTORETIC ) 20-25 MG per tablet Take 1 tablet by mouth daily. (Patient not taking: Reported on 01/17/2024)     loperamide (IMODIUM A-D) 2 MG tablet Take 2 mg by mouth 4 (four) times daily as needed for diarrhea or loose stools. (Patient not taking: Reported on 02/18/2024)     LORazepam  (ATIVAN ) 0.5 MG tablet Take 0.5 mg by mouth every 8 (eight) hours as needed for anxiety.     Multiple Vitamin (MULTIVITAMIN) capsule Take 1 capsule by mouth daily.     naloxone (NARCAN) nasal spray 4 mg/0.1 mL Place 1 spray into the nose. (Patient not taking: Reported on 02/18/2024)     ondansetron  (ZOFRAN ) 4 MG/5ML solution Take by mouth once. (Patient not taking: Reported on 02/18/2024)     ondansetron  (ZOFRAN ) 8 MG tablet Take 1 tablet (8 mg total) by mouth every 8 (eight) hours as needed for nausea or vomiting. Start on the third day after chemotherapy. 30 tablet 1   pantoprazole  (PROTONIX ) 40 MG tablet Take 1 tablet (40 mg total) by mouth 2 (two) times daily before a meal.     polyethylene glycol (MIRALAX  / GLYCOLAX ) 17 g packet Take 17 g by mouth daily. (Patient not taking: Reported on 02/18/2024)     pregabalin  (LYRICA ) 50 MG capsule Take 50 mg by mouth 2 (two) times daily.     prochlorperazine  (COMPAZINE ) 10 MG tablet Take  1 tablet (10 mg total) by mouth every 6 (six) hours as needed for nausea or vomiting. 30 tablet 1   rOPINIRole  (REQUIP  XL) 4 MG 24 hr tablet Take 1 tablet (4 mg total) by mouth at bedtime.     sertraline  (ZOLOFT ) 100 MG tablet Take 100 mg by mouth  daily.     TRADJENTA  5 MG TABS tablet Take 5 mg by mouth daily.     No current facility-administered medications for this visit.   Facility-Administered Medications Ordered in Other Visits  Medication Dose Route Frequency Provider Last Rate Last Admin   0.9 %  sodium chloride  infusion   Intravenous Continuous Koby Hartfield, MD 10 mL/hr at 02/25/24 1023 New Bag at 02/25/24 1023   CARBOplatin  (PARAPLATIN ) 180 mg in sodium chloride  0.9 % 100 mL chemo infusion  180 mg Intravenous Once Cammie Faulstich, MD       dexamethasone  (DECADRON ) injection 10 mg  10 mg Intravenous Once Erasmo Vertz, MD       iron  sucrose (VENOFER ) injection 200 mg  200 mg Intravenous Once Malyssa Maris, MD       PACLitaxel  (TAXOL ) 84 mg in sodium chloride  0.9 % 250 mL chemo infusion (</= 80mg /m2)  45 mg/m2 (Order-Specific) Intravenous Once Rakiyah Esch, MD        REVIEW OF SYSTEMS:   Pertinent information mentioned in HPI All other systems were reviewed with the patient and are negative.  PHYSICAL EXAMINATION: ECOG PERFORMANCE STATUS: 3 - Symptomatic, >50% confined to bed  Vitals:   02/25/24 0922  BP: 129/78  Pulse: 78  Resp: 14  Temp: 97.6 F (36.4 C)  SpO2: 98%   There were no vitals filed for this visit.   GENERAL:alert, no distress and comfortable SKIN: skin color, texture, turgor are normal, no rashes or significant lesions EYES: normal, conjunctiva are pink and non-injected, sclera clear OROPHARYNX:no exudate, no erythema and lips, buccal mucosa, and tongue normal  NECK: supple, thyroid  normal size, non-tender, without nodularity LYMPH:  no palpable lymphadenopathy in the cervical, axillary or inguinal LUNGS: clear to auscultation and percussion with normal breathing effort HEART: regular rate & rhythm and no murmurs and no lower extremity edema ABDOMEN:abdomen soft, non-tender and normal bowel sounds Musculoskeletal:no cyanosis of digits and no clubbing  PSYCH: alert & oriented x 3 with  fluent speech NEURO: no focal motor/sensory deficits  LABORATORY DATA:  I have reviewed the data as listed Lab Results  Component Value Date   WBC 2.8 (L) 02/25/2024   HGB 10.5 (L) 02/25/2024   HCT 33.7 (L) 02/25/2024   MCV 79.5 (L) 02/25/2024   PLT 166 02/25/2024   Recent Labs    02/11/24 0830 02/18/24 0800 02/25/24 0832  NA 139 140 142  K 3.5 3.4* 3.5  CL 101 101 104  CO2 30 31 32  GLUCOSE 181* 124* 175*  BUN 18 14 16   CREATININE 0.43* 0.64 0.50  CALCIUM  9.3 9.3 9.3  GFRNONAA >60 >60 >60  PROT 6.2* 6.2* 6.3*  ALBUMIN 3.5 3.5 3.4*  AST 13* 16 17  ALT 13 12 13   ALKPHOS 79 77 80  BILITOT 0.5 0.5 0.2    RADIOGRAPHIC STUDIES: I have personally reviewed the radiological images as listed and agreed with the findings in the report. No results found.

## 2024-02-28 ENCOUNTER — Ambulatory Visit
Admission: RE | Admit: 2024-02-28 | Discharge: 2024-02-28 | Disposition: A | Discharge status: Home / Self Care | Source: Ambulatory Visit | Attending: Radiation Oncology | Admitting: Radiation Oncology

## 2024-02-28 ENCOUNTER — Other Ambulatory Visit: Payer: Self-pay

## 2024-02-28 DIAGNOSIS — Z510 Encounter for antineoplastic radiation therapy: Secondary | ICD-10-CM | POA: Diagnosis not present

## 2024-02-28 LAB — RAD ONC ARIA SESSION SUMMARY
Course Elapsed Days: 28
Plan Fractions Treated to Date: 21
Plan Prescribed Dose Per Fraction: 2 Gy
Plan Total Fractions Prescribed: 33
Plan Total Prescribed Dose: 66 Gy
Reference Point Dosage Given to Date: 42 Gy
Reference Point Session Dosage Given: 2 Gy
Session Number: 21

## 2024-02-29 ENCOUNTER — Other Ambulatory Visit: Payer: Self-pay

## 2024-02-29 ENCOUNTER — Ambulatory Visit
Admission: RE | Admit: 2024-02-29 | Discharge: 2024-02-29 | Disposition: A | Discharge status: Home / Self Care | Source: Ambulatory Visit | Attending: Radiation Oncology | Admitting: Radiation Oncology

## 2024-02-29 DIAGNOSIS — Z510 Encounter for antineoplastic radiation therapy: Secondary | ICD-10-CM | POA: Diagnosis not present

## 2024-02-29 LAB — RAD ONC ARIA SESSION SUMMARY
Course Elapsed Days: 29
Plan Fractions Treated to Date: 22
Plan Prescribed Dose Per Fraction: 2 Gy
Plan Total Fractions Prescribed: 33
Plan Total Prescribed Dose: 66 Gy
Reference Point Dosage Given to Date: 44 Gy
Reference Point Session Dosage Given: 2 Gy
Session Number: 22

## 2024-03-01 ENCOUNTER — Other Ambulatory Visit: Payer: Self-pay

## 2024-03-01 ENCOUNTER — Ambulatory Visit
Admission: RE | Admit: 2024-03-01 | Discharge: 2024-03-01 | Disposition: A | Discharge status: Home / Self Care | Source: Ambulatory Visit | Attending: Radiation Oncology | Admitting: Radiation Oncology

## 2024-03-01 DIAGNOSIS — Z510 Encounter for antineoplastic radiation therapy: Secondary | ICD-10-CM | POA: Diagnosis not present

## 2024-03-01 LAB — RAD ONC ARIA SESSION SUMMARY
Course Elapsed Days: 30
Plan Fractions Treated to Date: 23
Plan Prescribed Dose Per Fraction: 2 Gy
Plan Total Fractions Prescribed: 33
Plan Total Prescribed Dose: 66 Gy
Reference Point Dosage Given to Date: 46 Gy
Reference Point Session Dosage Given: 2 Gy
Session Number: 23

## 2024-03-02 ENCOUNTER — Ambulatory Visit
Admission: RE | Admit: 2024-03-02 | Discharge: 2024-03-02 | Disposition: A | Discharge status: Home / Self Care | Source: Ambulatory Visit | Attending: Radiation Oncology | Admitting: Radiation Oncology

## 2024-03-02 ENCOUNTER — Other Ambulatory Visit: Payer: Self-pay

## 2024-03-02 DIAGNOSIS — Z79899 Other long term (current) drug therapy: Secondary | ICD-10-CM | POA: Diagnosis not present

## 2024-03-02 DIAGNOSIS — R911 Solitary pulmonary nodule: Secondary | ICD-10-CM | POA: Insufficient documentation

## 2024-03-02 DIAGNOSIS — E119 Type 2 diabetes mellitus without complications: Secondary | ICD-10-CM | POA: Diagnosis not present

## 2024-03-02 DIAGNOSIS — D509 Iron deficiency anemia, unspecified: Secondary | ICD-10-CM | POA: Diagnosis not present

## 2024-03-02 DIAGNOSIS — Z791 Long term (current) use of non-steroidal anti-inflammatories (NSAID): Secondary | ICD-10-CM | POA: Diagnosis not present

## 2024-03-02 DIAGNOSIS — Z87891 Personal history of nicotine dependence: Secondary | ICD-10-CM | POA: Insufficient documentation

## 2024-03-02 DIAGNOSIS — J449 Chronic obstructive pulmonary disease, unspecified: Secondary | ICD-10-CM | POA: Diagnosis not present

## 2024-03-02 DIAGNOSIS — R197 Diarrhea, unspecified: Secondary | ICD-10-CM | POA: Insufficient documentation

## 2024-03-02 DIAGNOSIS — I2510 Atherosclerotic heart disease of native coronary artery without angina pectoris: Secondary | ICD-10-CM | POA: Insufficient documentation

## 2024-03-02 DIAGNOSIS — Z8719 Personal history of other diseases of the digestive system: Secondary | ICD-10-CM | POA: Insufficient documentation

## 2024-03-02 DIAGNOSIS — Z803 Family history of malignant neoplasm of breast: Secondary | ICD-10-CM | POA: Diagnosis not present

## 2024-03-02 DIAGNOSIS — I110 Hypertensive heart disease with heart failure: Secondary | ICD-10-CM | POA: Insufficient documentation

## 2024-03-02 DIAGNOSIS — Z79890 Hormone replacement therapy (postmenopausal): Secondary | ICD-10-CM | POA: Insufficient documentation

## 2024-03-02 DIAGNOSIS — Z86718 Personal history of other venous thrombosis and embolism: Secondary | ICD-10-CM | POA: Diagnosis not present

## 2024-03-02 DIAGNOSIS — Z794 Long term (current) use of insulin: Secondary | ICD-10-CM | POA: Insufficient documentation

## 2024-03-02 DIAGNOSIS — E1140 Type 2 diabetes mellitus with diabetic neuropathy, unspecified: Secondary | ICD-10-CM | POA: Insufficient documentation

## 2024-03-02 DIAGNOSIS — I10 Essential (primary) hypertension: Secondary | ICD-10-CM | POA: Insufficient documentation

## 2024-03-02 DIAGNOSIS — Z7951 Long term (current) use of inhaled steroids: Secondary | ICD-10-CM | POA: Insufficient documentation

## 2024-03-02 DIAGNOSIS — J441 Chronic obstructive pulmonary disease with (acute) exacerbation: Secondary | ICD-10-CM | POA: Diagnosis not present

## 2024-03-02 DIAGNOSIS — Z510 Encounter for antineoplastic radiation therapy: Secondary | ICD-10-CM | POA: Diagnosis not present

## 2024-03-02 DIAGNOSIS — I69351 Hemiplegia and hemiparesis following cerebral infarction affecting right dominant side: Secondary | ICD-10-CM | POA: Insufficient documentation

## 2024-03-02 DIAGNOSIS — Z5112 Encounter for antineoplastic immunotherapy: Secondary | ICD-10-CM | POA: Insufficient documentation

## 2024-03-02 DIAGNOSIS — K5900 Constipation, unspecified: Secondary | ICD-10-CM | POA: Insufficient documentation

## 2024-03-02 DIAGNOSIS — E785 Hyperlipidemia, unspecified: Secondary | ICD-10-CM | POA: Insufficient documentation

## 2024-03-02 DIAGNOSIS — K264 Chronic or unspecified duodenal ulcer with hemorrhage: Secondary | ICD-10-CM | POA: Insufficient documentation

## 2024-03-02 DIAGNOSIS — Z7952 Long term (current) use of systemic steroids: Secondary | ICD-10-CM | POA: Insufficient documentation

## 2024-03-02 DIAGNOSIS — K219 Gastro-esophageal reflux disease without esophagitis: Secondary | ICD-10-CM | POA: Diagnosis not present

## 2024-03-02 DIAGNOSIS — R200 Anesthesia of skin: Secondary | ICD-10-CM | POA: Insufficient documentation

## 2024-03-02 DIAGNOSIS — Z9049 Acquired absence of other specified parts of digestive tract: Secondary | ICD-10-CM | POA: Insufficient documentation

## 2024-03-02 DIAGNOSIS — E876 Hypokalemia: Secondary | ICD-10-CM | POA: Insufficient documentation

## 2024-03-02 DIAGNOSIS — I509 Heart failure, unspecified: Secondary | ICD-10-CM | POA: Diagnosis not present

## 2024-03-02 DIAGNOSIS — Z9981 Dependence on supplemental oxygen: Secondary | ICD-10-CM | POA: Insufficient documentation

## 2024-03-02 DIAGNOSIS — Z7984 Long term (current) use of oral hypoglycemic drugs: Secondary | ICD-10-CM | POA: Insufficient documentation

## 2024-03-02 DIAGNOSIS — Z5111 Encounter for antineoplastic chemotherapy: Secondary | ICD-10-CM | POA: Diagnosis not present

## 2024-03-02 DIAGNOSIS — Z7982 Long term (current) use of aspirin: Secondary | ICD-10-CM | POA: Insufficient documentation

## 2024-03-02 DIAGNOSIS — R531 Weakness: Secondary | ICD-10-CM | POA: Insufficient documentation

## 2024-03-02 DIAGNOSIS — G9340 Encephalopathy, unspecified: Secondary | ICD-10-CM | POA: Insufficient documentation

## 2024-03-02 DIAGNOSIS — C3411 Malignant neoplasm of upper lobe, right bronchus or lung: Secondary | ICD-10-CM | POA: Insufficient documentation

## 2024-03-02 DIAGNOSIS — D696 Thrombocytopenia, unspecified: Secondary | ICD-10-CM | POA: Insufficient documentation

## 2024-03-02 DIAGNOSIS — Z923 Personal history of irradiation: Secondary | ICD-10-CM | POA: Insufficient documentation

## 2024-03-02 DIAGNOSIS — I700 Atherosclerosis of aorta: Secondary | ICD-10-CM | POA: Insufficient documentation

## 2024-03-02 DIAGNOSIS — D72819 Decreased white blood cell count, unspecified: Secondary | ICD-10-CM | POA: Insufficient documentation

## 2024-03-02 LAB — RAD ONC ARIA SESSION SUMMARY
Course Elapsed Days: 31
Plan Fractions Treated to Date: 24
Plan Prescribed Dose Per Fraction: 2 Gy
Plan Total Fractions Prescribed: 33
Plan Total Prescribed Dose: 66 Gy
Reference Point Dosage Given to Date: 48 Gy
Reference Point Session Dosage Given: 2 Gy
Session Number: 24

## 2024-03-03 ENCOUNTER — Ambulatory Visit
Admission: RE | Admit: 2024-03-03 | Discharge: 2024-03-03 | Disposition: A | Discharge status: Home / Self Care | Source: Ambulatory Visit | Attending: Radiation Oncology | Admitting: Radiation Oncology

## 2024-03-03 ENCOUNTER — Inpatient Hospital Stay (HOSPITAL_BASED_OUTPATIENT_CLINIC_OR_DEPARTMENT_OTHER): Admitting: Internal Medicine

## 2024-03-03 ENCOUNTER — Inpatient Hospital Stay

## 2024-03-03 ENCOUNTER — Other Ambulatory Visit: Payer: Self-pay

## 2024-03-03 ENCOUNTER — Encounter: Payer: Self-pay | Admitting: Internal Medicine

## 2024-03-03 ENCOUNTER — Telehealth: Payer: Self-pay | Admitting: *Deleted

## 2024-03-03 VITALS — BP 141/86 | HR 75

## 2024-03-03 VITALS — BP 140/90 | HR 78 | Temp 98.9°F | Resp 16

## 2024-03-03 DIAGNOSIS — E1140 Type 2 diabetes mellitus with diabetic neuropathy, unspecified: Secondary | ICD-10-CM | POA: Insufficient documentation

## 2024-03-03 DIAGNOSIS — Z9981 Dependence on supplemental oxygen: Secondary | ICD-10-CM | POA: Insufficient documentation

## 2024-03-03 DIAGNOSIS — D72819 Decreased white blood cell count, unspecified: Secondary | ICD-10-CM | POA: Insufficient documentation

## 2024-03-03 DIAGNOSIS — Z803 Family history of malignant neoplasm of breast: Secondary | ICD-10-CM | POA: Insufficient documentation

## 2024-03-03 DIAGNOSIS — Z79890 Hormone replacement therapy (postmenopausal): Secondary | ICD-10-CM | POA: Insufficient documentation

## 2024-03-03 DIAGNOSIS — C3411 Malignant neoplasm of upper lobe, right bronchus or lung: Secondary | ICD-10-CM

## 2024-03-03 DIAGNOSIS — Z7984 Long term (current) use of oral hypoglycemic drugs: Secondary | ICD-10-CM | POA: Insufficient documentation

## 2024-03-03 DIAGNOSIS — E876 Hypokalemia: Secondary | ICD-10-CM | POA: Insufficient documentation

## 2024-03-03 DIAGNOSIS — R911 Solitary pulmonary nodule: Secondary | ICD-10-CM | POA: Insufficient documentation

## 2024-03-03 DIAGNOSIS — D696 Thrombocytopenia, unspecified: Secondary | ICD-10-CM | POA: Insufficient documentation

## 2024-03-03 DIAGNOSIS — I700 Atherosclerosis of aorta: Secondary | ICD-10-CM | POA: Insufficient documentation

## 2024-03-03 DIAGNOSIS — D509 Iron deficiency anemia, unspecified: Secondary | ICD-10-CM

## 2024-03-03 DIAGNOSIS — Z86718 Personal history of other venous thrombosis and embolism: Secondary | ICD-10-CM | POA: Insufficient documentation

## 2024-03-03 DIAGNOSIS — Z87891 Personal history of nicotine dependence: Secondary | ICD-10-CM | POA: Insufficient documentation

## 2024-03-03 DIAGNOSIS — E785 Hyperlipidemia, unspecified: Secondary | ICD-10-CM | POA: Insufficient documentation

## 2024-03-03 DIAGNOSIS — I110 Hypertensive heart disease with heart failure: Secondary | ICD-10-CM | POA: Insufficient documentation

## 2024-03-03 DIAGNOSIS — Z9049 Acquired absence of other specified parts of digestive tract: Secondary | ICD-10-CM | POA: Insufficient documentation

## 2024-03-03 DIAGNOSIS — K5900 Constipation, unspecified: Secondary | ICD-10-CM | POA: Insufficient documentation

## 2024-03-03 DIAGNOSIS — Z791 Long term (current) use of non-steroidal anti-inflammatories (NSAID): Secondary | ICD-10-CM | POA: Insufficient documentation

## 2024-03-03 DIAGNOSIS — I509 Heart failure, unspecified: Secondary | ICD-10-CM | POA: Insufficient documentation

## 2024-03-03 DIAGNOSIS — Z79899 Other long term (current) drug therapy: Secondary | ICD-10-CM | POA: Insufficient documentation

## 2024-03-03 DIAGNOSIS — Z5111 Encounter for antineoplastic chemotherapy: Secondary | ICD-10-CM | POA: Diagnosis not present

## 2024-03-03 DIAGNOSIS — K219 Gastro-esophageal reflux disease without esophagitis: Secondary | ICD-10-CM | POA: Insufficient documentation

## 2024-03-03 DIAGNOSIS — Z7951 Long term (current) use of inhaled steroids: Secondary | ICD-10-CM | POA: Insufficient documentation

## 2024-03-03 DIAGNOSIS — Z7952 Long term (current) use of systemic steroids: Secondary | ICD-10-CM | POA: Insufficient documentation

## 2024-03-03 DIAGNOSIS — Z923 Personal history of irradiation: Secondary | ICD-10-CM | POA: Insufficient documentation

## 2024-03-03 DIAGNOSIS — I2510 Atherosclerotic heart disease of native coronary artery without angina pectoris: Secondary | ICD-10-CM | POA: Insufficient documentation

## 2024-03-03 DIAGNOSIS — K264 Chronic or unspecified duodenal ulcer with hemorrhage: Secondary | ICD-10-CM | POA: Insufficient documentation

## 2024-03-03 DIAGNOSIS — Z794 Long term (current) use of insulin: Secondary | ICD-10-CM | POA: Insufficient documentation

## 2024-03-03 DIAGNOSIS — Z5112 Encounter for antineoplastic immunotherapy: Secondary | ICD-10-CM | POA: Diagnosis not present

## 2024-03-03 DIAGNOSIS — Z7982 Long term (current) use of aspirin: Secondary | ICD-10-CM | POA: Insufficient documentation

## 2024-03-03 DIAGNOSIS — G9340 Encephalopathy, unspecified: Secondary | ICD-10-CM | POA: Insufficient documentation

## 2024-03-03 DIAGNOSIS — J441 Chronic obstructive pulmonary disease with (acute) exacerbation: Secondary | ICD-10-CM | POA: Insufficient documentation

## 2024-03-03 LAB — CBC WITH DIFFERENTIAL (CANCER CENTER ONLY)
Abs Immature Granulocytes: 0.03 10*3/uL (ref 0.00–0.07)
Basophils Absolute: 0 10*3/uL (ref 0.0–0.1)
Basophils Relative: 0 %
Eosinophils Absolute: 0 10*3/uL (ref 0.0–0.5)
Eosinophils Relative: 0 %
HCT: 32.5 % — ABNORMAL LOW (ref 36.0–46.0)
Hemoglobin: 10.4 g/dL — ABNORMAL LOW (ref 12.0–15.0)
Immature Granulocytes: 1 %
Lymphocytes Relative: 12 %
Lymphs Abs: 0.4 10*3/uL — ABNORMAL LOW (ref 0.7–4.0)
MCH: 25.5 pg — ABNORMAL LOW (ref 26.0–34.0)
MCHC: 32 g/dL (ref 30.0–36.0)
MCV: 79.7 fL — ABNORMAL LOW (ref 80.0–100.0)
Monocytes Absolute: 0.2 10*3/uL (ref 0.1–1.0)
Monocytes Relative: 8 %
Neutro Abs: 2.4 10*3/uL (ref 1.7–7.7)
Neutrophils Relative %: 79 %
Platelet Count: 149 10*3/uL — ABNORMAL LOW (ref 150–400)
RBC: 4.08 MIL/uL (ref 3.87–5.11)
RDW: 18 % — ABNORMAL HIGH (ref 11.5–15.5)
WBC Count: 3.1 10*3/uL — ABNORMAL LOW (ref 4.0–10.5)
nRBC: 0 % (ref 0.0–0.2)

## 2024-03-03 LAB — RAD ONC ARIA SESSION SUMMARY
Course Elapsed Days: 32
Plan Fractions Treated to Date: 25
Plan Prescribed Dose Per Fraction: 2 Gy
Plan Total Fractions Prescribed: 33
Plan Total Prescribed Dose: 66 Gy
Reference Point Dosage Given to Date: 50 Gy
Reference Point Session Dosage Given: 2 Gy
Session Number: 25

## 2024-03-03 LAB — CMP (CANCER CENTER ONLY)
ALT: 15 U/L (ref 0–44)
AST: 19 U/L (ref 15–41)
Albumin: 3.6 g/dL (ref 3.5–5.0)
Alkaline Phosphatase: 74 U/L (ref 38–126)
Anion gap: 8 (ref 5–15)
BUN: 19 mg/dL (ref 8–23)
CO2: 31 mmol/L (ref 22–32)
Calcium: 9.1 mg/dL (ref 8.9–10.3)
Chloride: 100 mmol/L (ref 98–111)
Creatinine: 0.6 mg/dL (ref 0.44–1.00)
GFR, Estimated: >60 mL/min (ref >60)
Glucose, Bld: 169 mg/dL — ABNORMAL HIGH (ref 70–99)
Potassium: 3.9 mmol/L (ref 3.5–5.1)
Sodium: 139 mmol/L (ref 135–145)
Total Bilirubin: 0.4 mg/dL (ref 0.0–1.2)
Total Protein: 6.1 g/dL — ABNORMAL LOW (ref 6.5–8.1)

## 2024-03-03 MED ORDER — SENNA 8.6 MG PO TABS: 1.0000 | ORAL_TABLET | Freq: Two times a day (BID) | ORAL | 3 refills | Status: DC

## 2024-03-03 MED ORDER — HEPARIN SOD (PORK) LOCK FLUSH 100 UNIT/ML IV SOLN
500.0000 [IU] | Freq: Once | INTRAVENOUS | Status: AC | PRN
  Administered 2024-03-03: 500 [IU]
  Filled 2024-03-03: qty 5

## 2024-03-03 MED ORDER — SODIUM CHLORIDE 0.9% FLUSH
10.0000 mL | INTRAVENOUS | Status: DC | PRN
  Administered 2024-03-03: 10 mL
  Filled 2024-03-03: qty 10

## 2024-03-03 MED ORDER — DEXAMETHASONE SODIUM PHOSPHATE 10 MG/ML IJ SOLN
10.0000 mg | Freq: Once | INTRAMUSCULAR | Status: AC
Start: 2024-03-03 — End: 2024-03-03
  Administered 2024-03-03: 10 mg via INTRAVENOUS
  Filled 2024-03-03: qty 1

## 2024-03-03 MED ORDER — SODIUM CHLORIDE 0.9 % IV SOLN
45.0000 mg/m2 | Freq: Once | INTRAVENOUS | Status: AC
  Administered 2024-03-03: 84 mg via INTRAVENOUS
  Filled 2024-03-03: qty 14

## 2024-03-03 MED ORDER — IRON SUCROSE 20 MG/ML IV SOLN
200.0000 mg | Freq: Once | INTRAVENOUS | Status: AC
  Administered 2024-03-03: 200 mg via INTRAVENOUS
  Filled 2024-03-03: qty 10

## 2024-03-03 MED ORDER — FAMOTIDINE IN NACL 20-0.9 MG/50ML-% IV SOLN
20.0000 mg | Freq: Once | INTRAVENOUS | Status: AC
  Administered 2024-03-03: 20 mg via INTRAVENOUS
  Filled 2024-03-03: qty 50

## 2024-03-03 MED ORDER — PALONOSETRON HCL INJECTION 0.25 MG/5ML
0.2500 mg | Freq: Once | INTRAVENOUS | Status: AC
  Administered 2024-03-03: 0.25 mg via INTRAVENOUS
  Filled 2024-03-03: qty 5

## 2024-03-03 MED ORDER — CARBOPLATIN CHEMO INJECTION 450 MG/45ML
178.2000 mg | Freq: Once | INTRAVENOUS | Status: AC
  Administered 2024-03-03: 180 mg via INTRAVENOUS
  Filled 2024-03-03: qty 18

## 2024-03-03 MED ORDER — SODIUM CHLORIDE 0.9 % IV SOLN
INTRAVENOUS | Status: DC
  Filled 2024-03-03 (×2): qty 250

## 2024-03-03 MED ORDER — DIPHENHYDRAMINE HCL 50 MG/ML IJ SOLN
50.0000 mg | Freq: Once | INTRAMUSCULAR | Status: AC
  Administered 2024-03-03: 50 mg via INTRAVENOUS
  Filled 2024-03-03: qty 1

## 2024-03-03 NOTE — Progress Notes (Signed)
 She is having constipation which the colace isn't working. She has neuropathy, and she feels like it is getting a little bit worse in her right foot. No new questions for the doctor.

## 2024-03-03 NOTE — Patient Instructions (Signed)
 [facility] - A DEPT OF [facility]  Discharge Instructions: Thank you for choosing [facility] to provide your oncology and hematology care.  If you have a lab appointment with the Cancer Center, please go directly to the Cancer Center and check in at the registration area.  Wear comfortable clothing and clothing appropriate for easy access to any Portacath or PICC line.   We strive to give you quality time with your provider. You may need to reschedule your appointment if you arrive late (15 or more minutes).  Arriving late affects you and other patients whose appointments are after yours.  Also, if you miss three or more appointments without notifying the office, you may be dismissed from the clinic at the provider's discretion.      For prescription refill requests, have your pharmacy contact our office and allow 72 hours for refills to be completed.    Today you received the following chemotherapy and/or immunotherapy agents- taxol, carboplatin      To help prevent nausea and vomiting after your treatment, we encourage you to take your nausea medication as directed.  BELOW ARE SYMPTOMS THAT SHOULD BE REPORTED IMMEDIATELY: *FEVER GREATER THAN 100.4 F (38 C) OR HIGHER *CHILLS OR SWEATING *NAUSEA AND VOMITING THAT IS NOT CONTROLLED WITH YOUR NAUSEA MEDICATION *UNUSUAL SHORTNESS OF BREATH *UNUSUAL BRUISING OR BLEEDING *URINARY PROBLEMS (pain or burning when urinating, or frequent urination) *BOWEL PROBLEMS (unusual diarrhea, constipation, pain near the anus) TENDERNESS IN MOUTH AND THROAT WITH OR WITHOUT PRESENCE OF ULCERS (sore throat, sores in mouth, or a toothache) UNUSUAL RASH, SWELLING OR PAIN  UNUSUAL VAGINAL DISCHARGE OR ITCHING   Items with * indicate a potential emergency and should be followed up as soon as possible or go to the Emergency Department if any problems should occur.  Please show the CHEMOTHERAPY ALERT CARD or  IMMUNOTHERAPY ALERT CARD at check-in to the Emergency Department and triage nurse.  Should you have questions after your visit or need to cancel or reschedule your appointment, please contact [facility] - A DEPT OF [facility]  [phone] and follow the prompts.  Office hours are 8:00 a.m. to 4:30 p.m. Monday - Friday. Please note that voicemails left after 4:00 p.m. may not be returned until the following business day.  We are closed weekends and major holidays. You have access to a nurse at all times for urgent questions. Please call the main number to the clinic [phone] and follow the prompts.  For any non-urgent questions, you may also contact your provider using MyChart. We now offer e-Visits for anyone 36 and older to request care online for non-urgent symptoms. For details visit [email or web address].   Also download the MyChart app! Go to the app store, search "MyChart", open the app, select [place], and log in with your MyChart username and password.

## 2024-03-03 NOTE — Telephone Encounter (Signed)
 Per dr. Aris Bel- patient needs script for senakot 8.6 mg tabs. Take 2 tabs daily as needed.

## 2024-03-03 NOTE — Progress Notes (Signed)
 [facility] CONSULT NOTE  Patient Care Team: Center, [facility] as PCP - General (General Practice) Burnie Cartwright, RN as Registered Nurse Arlette Benders, RN (Inactive) as Registered Nurse Tamera Pingley, MD as Consulting Physician (Oncology) Drake Gens, RN as Oncology Nurse Navigator Glenis Langdon, MD as Consulting Physician (Radiation Oncology)  Reason for visit- right upper lobe lung non-small cell cancer.  CANCER STAGING   Cancer Staging  Cancer of upper lobe of right lung [facility]) Staging form: Lung, AJCC V9 - Clinical stage from 11/12/2023: Stage IIIA (cT3, cN1, cM0) - Signed by Donaldo Teegarden, MD on 01/07/2024   ASSESSMENT & PLAN:  Michele House 68 y.o. 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  # RUL NSCLC, Stage IIIA - Status post EBUS with biopsy with Dr.Aleskerov.  Right upper lobe lung biopsy showed non-small cell carcinoma.  Coexpress is TTF-1 and p40.  With TTF-1 expression, could be adenocarcinoma.  Insufficient material for ancillary molecular testing. RUL bronchial lavage positive for malignancy. Station 7 lymph node negative for malignancy.  -PET scan from April 22, 2024 showed 1.8 cm nodule in the right upper lobe with SUV of 37.5.  6 mm nodule more superiorly hypermetabolic for size could be satellite lesion.  There are several small hypermetabolic right hilar lymph nodes largest with SUV of 16.  No evidence of metastatic disease otherwise.  -Started on radiation on 01/31/2024.  Labs reviewed and acceptable for treatment.   Will proceed with cycle 5 of carboplatin  AUC 2 and Taxol  45 mg/m2.  After completion of 6 cycles of weekly CarboTaxol, will transition to Durvalumab maintenance once a month for 1 year.  Report  -MRI brain with and without contrast negative for metastasis.   - Liquid  Tempus CT DNA less than 0.25%.  TMB 4.4, MSI-S.  No targetable mutations.  # COPD on 2 L oxygen -Continue follow-up with pulmonary  # Iron  deficiency anemia -Hemoglobin 10.6.  Ferritin 37 and saturation 10%. -Schedule for IV Venofer  200 mg weekly x 5 doses.  # Constipation -Continue with Colace.  Mild improvement.  Add Senokot 2 tabs daily as needed. - Does not prefer MiraLAX .  # History of GI bleed -s/p EGD which showed bleeding duodenal ulcer with clip placed 10/28/2023.  - s/p IR embolization on 10/30/2023.  Plavix  was discontinued.  She was continued on baby aspirin .  # History of DVT - Had bilateral lower extremity Dopplers for leg pain.  Showed right calf DVT.   - Status post IVC filter on 11/09/2023.  RTC in 1 week with MD, labs, CarboTaxol, Venofer   No orders of the defined types were placed in this encounter.   The total time spent in the appointment was 30 minutes encounter with patients including review of chart and various tests results, discussions about plan of care and coordination of care plan   All questions were answered. The patient knows to call the clinic with any problems, questions or concerns. No barriers to learning was detected.  Loreatha Rodney, MD 5/2/202510:58 AM   HISTORY OF PRESENTING ILLNESS:  Michele House 68 y.o. 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  Patient had long complicated hospital course from 10/02/2023 to 11/15/2023.  Presented with  COPD exacerbation, pneumonia, UTI and encephalopathy.  Requiring intubation.   CTA chest from 09/24/2023 showed increase in size of spiculated pulmonary nodule in the inferior right upper lobe 1.6 cm compared to 1.3 cm previously.  Initial lung biopsy on 10/20/2023 was nondiagnostic.    Repeat EBUS with biopsy with Dr.Aleskerov on 11/12/2023.  Right upper  lobe lung biopsy showed non-small cell carcinoma.  Coexpress is TTF-1 and p40.  Insufficient material for ancillary molecular testing. RUL bronchial lavage positive for malignancy. Station 7 lymph node negative for malignancy.  Hospital course complicated by encephalopathy.  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  EEG showed diffuse slowing without seizures.  LP unremarkable.  Autoimmune/paraneoplastic panel sent.  Treated with 5 days of high-dose Solu-Medrol  with not much improvement.  Also developed GI bleed causing hemorrhagic shock. s/p EGD which showed bleeding duodenal ulcer with clip placed 10/28/2023. s/p IR embolization on 10/30/2023.  Plavix  was discontinued.  She was continued on baby aspirin .  Had bilateral lower extremity Dopplers for leg pain.  Showed right calf DVT.  Status post IVC filter on 11/09/2023.  Interval history Patient was seen today in the clinic accompanied with daughter.  On concurrent chemo RT for stage IIIa lung cancer. She is tolerating treatment well overall.  Denies any nausea, vomiting denies any changes with the bowel movements.  Appetite is good. 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  Denies any worsening.  Neuropathy in the right hand has improved. Reports worsening of constipation.  On Colace.  We discussed about adding senna.  She does not like miralax   I have reviewed her chart and materials related to her cancer extensively and collaborated history with the patient. Summary of oncologic history is as follows: Oncology History  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)  11/12/2023 Cancer Staging   Staging form: Lung, AJCC V9 - Clinical stage from 11/12/2023: Stage IIIA (cT3, cN1, cM0) - Signed by Loreatha Rodney, MD on 01/07/2024   12/17/2023 Initial  Diagnosis   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)   02/04/2024 -  Chemotherapy   Patient is on Treatment Plan : LUNG Carboplatin  + Paclitaxel  + XRT q7d       MEDICAL HISTORY:  Past Medical History:  Diagnosis Date   Acid reflux    COPD (chronic obstructive pulmonary disease) (HCC)    Diabetes mellitus without complication (HCC)    Hyperlipidemia    Hypertension    Thyroid  disease     SURGICAL HISTORY: Past Surgical History:  Procedure Laterality Date   appendectomy     APPENDECTOMY     BREAST BIOPSY Right    CORE W/CLIP - NEG   ESOPHAGOGASTRODUODENOSCOPY N/A 10/28/2023   Procedure: ESOPHAGOGASTRODUODENOSCOPY (EGD);  Surgeon: Marnee Sink, MD;  Location: [facility] ENDOSCOPY;  Service: Endoscopy;  Laterality: N/A;   FLEXIBLE BRONCHOSCOPY Bilateral 11/12/2023   Procedure: FLEXIBLE BRONCHOSCOPY;  Surgeon: Erskin Hearing, MD;  Location: [facility] ORS;  Service: Thoracic;  Laterality: Bilateral;   IR EMBO ART  VEN HEMORR LYMPH EXTRAV  INC GUIDE ROADMAPPING  10/30/2023   IR IMAGING GUIDED PORT INSERTION  01/18/2024   IVC FILTER INSERTION N/A 11/09/2023   Procedure: IVC FILTER INSERTION;  Surgeon: Celso College, MD;  Location: [facility] INVASIVE CV LAB;  Service: Cardiovascular;  Laterality: N/A;   TOTAL VAGINAL HYSTERECTOMY  TUMOR REMOVAL     benign;stomach   VIDEO BRONCHOSCOPY WITH ENDOBRONCHIAL NAVIGATION Right 10/20/2023   Procedure: VIDEO BRONCHOSCOPY WITH ENDOBRONCHIAL NAVIGATION;  Surgeon: Erskin Hearing, MD;  Location: [facility] ORS;  Service: Thoracic;  Laterality: Right;   VIDEO BRONCHOSCOPY WITH ENDOBRONCHIAL NAVIGATION Right 11/12/2023   Procedure: VIDEO BRONCHOSCOPY WITH ENDOBRONCHIAL NAVIGATION;  Surgeon: Erskin Hearing, MD;  Location: [facility] ORS;  Service: Thoracic;  Laterality: Right;    SOCIAL HISTORY: Social History   Socioeconomic History   Marital status: Single    Spouse name: Not on file   Number of children: Not on file   Years of education: Not on file   Highest  education level: Not on file  Occupational History   Not on file  Tobacco Use   Smoking status: Former    Current packs/day: 0.00    Types: Cigarettes    Quit date: 12/15/1985    Years since quitting: 38.2   Smokeless tobacco: Never  Substance and Sexual Activity   Alcohol use: No   Drug use: No   Sexual activity: Not Currently  Other Topics Concern   Not on file  Social History Narrative   Not on file   Social Drivers of Health   Financial Resource Strain: Not on file  Food Insecurity: Patient Unable To Answer (10/03/2023)   Hunger Vital Sign    Worried About Running Out of Food in the Last Year: Patient unable to answer    Ran Out of Food in the Last Year: Patient unable to answer  Recent Concern: Food Insecurity - Food Insecurity Present (09/24/2023)   Hunger Vital Sign    Worried About Running Out of Food in the Last Year: Sometimes true    Ran Out of Food in the Last Year: Sometimes true  Transportation Needs: Patient Unable To Answer (10/03/2023)   PRAPARE - Transportation    Lack of Transportation (Medical): Patient unable to answer    Lack of Transportation (Non-Medical): Patient unable to answer  Recent Concern: Transportation Needs - Unmet Transportation Needs (09/24/2023)   PRAPARE - Administrator, Civil Service (Medical): Yes    Lack of Transportation (Non-Medical): Yes  Physical Activity: Not on file  Stress: Not on file  Social Connections: Patient Declined (11/02/2023)   Social Connection and Isolation Panel [NHANES]    Frequency of Communication with Friends and Family: Patient declined    Frequency of Social Gatherings with Friends and Family: Patient declined    Attends Religious Services: Patient declined    Database administrator or Organizations: Patient declined    Attends Banker Meetings: Patient declined    Marital Status: Patient declined  Intimate Partner Violence: Patient Unable To Answer (10/03/2023)   Humiliation,  Afraid, Rape, and Kick questionnaire    Fear of Current or Ex-Partner: Patient unable to answer    Emotionally Abused: Patient unable to answer    Physically Abused: Patient unable to answer    Sexually Abused: Patient unable to answer    FAMILY HISTORY: Family History  Problem Relation Age of Onset   Heart attack Mother    Hypertension Mother    Breast cancer Sister 9   Breast cancer Maternal Aunt        40'S   Breast cancer Maternal Grandmother     ALLERGIES:  is allergic to penicillins, aspirin , and sulfa antibiotics.  MEDICATIONS:  Current Outpatient Medications  Medication Sig Dispense Refill   acetaminophen  (TYLENOL ) 325 MG tablet Take 650 mg  by mouth every 6 (six) hours as needed.     aspirin  EC 81 MG tablet Take 81 mg by mouth daily.     atorvastatin  (LIPITOR) 20 MG tablet Take 20 mg by mouth daily.     calcium  carbonate (TUMS - DOSED IN MG ELEMENTAL CALCIUM ) 500 MG chewable tablet Chew 1 tablet by mouth daily.     cetirizine (ZYRTEC) 10 MG tablet Take 10 mg by mouth daily.     COMBIVENT  RESPIMAT 20-100 MCG/ACT AERS respimat Inhale into the lungs every 6 (six) hours as needed for wheezing or shortness of breath.     dexamethasone  (DECADRON ) 4 MG tablet Take 2 tablets daily for 2 days, start the day after chemotherapy. Take with food. 30 tablet 1   diclofenac sodium (VOLTAREN) 1 % GEL Apply topically 4 (four) times daily.     docusate sodium  (COLACE) 100 MG capsule Take 1 capsule (100 mg total) by mouth 2 (two) times daily.     DULERA 200-5 MCG/ACT AERO Inhale 2 puffs into the lungs every 12 (twelve) hours.     furosemide  (LASIX ) 20 MG tablet Take 1 tablet (20 mg total) by mouth daily. 30 tablet 0   glipiZIDE  (GLUCOTROL ) 10 MG tablet Take 10 mg by mouth daily before breakfast.     insulin  aspart (NOVOLOG ) 100 UNIT/ML injection Inject 4 Units into the skin 3 (three) times daily with meals.     insulin  glargine-yfgn (SEMGLEE ) 100 UNIT/ML injection Inject 0.18 mLs (18 Units  total) into the skin daily.     ipratropium (ATROVENT HFA) 17 MCG/ACT inhaler Inhale 2 puffs into the lungs every 6 (six) hours.     levETIRAcetam  (KEPPRA ) 500 MG tablet Take 1 tablet (500 mg total) by mouth 2 (two) times daily.     levothyroxine  (SYNTHROID ) 150 MCG tablet Take 1 tablet (150 mcg total) by mouth daily before breakfast. Increased from 100.     lidocaine -prilocaine  (EMLA ) cream Apply to affected area once 30 g 3   [Paused] lisinopril -hydrochlorothiazide  (PRINZIDE ,ZESTORETIC ) 20-25 MG per tablet Take 1 tablet by mouth daily.     LORazepam  (ATIVAN ) 0.5 MG tablet Take 0.5 mg by mouth every 8 (eight) hours as needed for anxiety.     Multiple Vitamin (MULTIVITAMIN) capsule Take 1 capsule by mouth daily.     pantoprazole  (PROTONIX ) 40 MG tablet Take 1 tablet (40 mg total) by mouth 2 (two) times daily before a meal.     pregabalin  (LYRICA ) 50 MG capsule Take 50 mg by mouth 2 (two) times daily.     prochlorperazine  (COMPAZINE ) 10 MG tablet Take 1 tablet (10 mg total) by mouth every 6 (six) hours as needed for nausea or vomiting. 30 tablet 1   rOPINIRole  (REQUIP  XL) 4 MG 24 hr tablet Take 1 tablet (4 mg total) by mouth at bedtime.     sertraline  (ZOLOFT ) 100 MG tablet Take 100 mg by mouth daily.     TRADJENTA  5 MG TABS tablet Take 5 mg by mouth daily.     loperamide (IMODIUM A-D) 2 MG tablet Take 2 mg by mouth 4 (four) times daily as needed for diarrhea or loose stools. (Patient not taking: Reported on 03/03/2024)     naloxone (NARCAN) nasal spray 4 mg/0.1 mL Place 1 spray into the nose. (Patient not taking: Reported on 03/03/2024)     ondansetron  (ZOFRAN ) 4 MG/5ML solution Take by mouth once. (Patient not taking: Reported on 03/03/2024)     ondansetron  (ZOFRAN ) 8 MG tablet Take 1 tablet (  8 mg total) by mouth every 8 (eight) hours as needed for nausea or vomiting. Start on the third day after chemotherapy. (Patient not taking: Reported on 03/03/2024) 30 tablet 1   polyethylene glycol (MIRALAX  /  GLYCOLAX ) 17 g packet Take 17 g by mouth daily. (Patient not taking: Reported on 03/03/2024)     No current facility-administered medications for this visit.   Facility-Administered Medications Ordered in Other Visits  Medication Dose Route Frequency Provider Last Rate Last Admin   0.9 %  sodium chloride  infusion   Intravenous Continuous Terrisa Curfman, MD 10 mL/hr at 03/03/24 1008 New Bag at 03/03/24 1008   CARBOplatin  (PARAPLATIN ) 180 mg in sodium chloride  0.9 % 100 mL chemo infusion  180 mg Intravenous Once Chey Cho, MD       heparin  lock flush 100 unit/mL  500 Units Intracatheter Once PRN Benjamine Strout, MD       oxyCODONE  (Oxy IR/ROXICODONE ) immediate release tablet 5 mg  5 mg Oral Once Annisten Manchester, MD       PACLitaxel  (TAXOL ) 84 mg in sodium chloride  0.9 % 250 mL chemo infusion (</= 80mg /m2)  45 mg/m2 (Order-Specific) Intravenous Once Bertis Hustead, MD       sodium chloride  flush (NS) 0.9 % injection 10 mL  10 mL Intracatheter PRN Nysha Koplin, MD        REVIEW OF SYSTEMS:   Pertinent information mentioned in HPI All other systems were reviewed with the patient and are negative.  PHYSICAL EXAMINATION: ECOG PERFORMANCE STATUS: 3 - Symptomatic, >50% confined to bed  Vitals:   03/03/24 0915  BP: (!) 140/90  Pulse: 78  Resp: 16  Temp: 98.9 F (37.2 C)  SpO2: 99%   There were no vitals filed for this visit.   GENERAL:alert, no distress and comfortable SKIN: skin color, texture, turgor are normal, no rashes or significant lesions EYES: normal, conjunctiva are pink and non-injected, sclera clear OROPHARYNX:no exudate, no erythema and lips, buccal mucosa, and tongue normal  NECK: supple, thyroid  normal size, non-tender, without nodularity LYMPH:  no palpable lymphadenopathy in the cervical, axillary or inguinal LUNGS: clear to auscultation and percussion with normal breathing effort HEART: regular rate & rhythm and no murmurs and no lower extremity  edema ABDOMEN:abdomen soft, non-tender and normal bowel sounds Musculoskeletal:no cyanosis of digits and no clubbing  PSYCH: alert & oriented x 3 with fluent speech NEURO: no focal motor/sensory deficits  LABORATORY DATA:  I have reviewed the data as listed Lab Results  Component Value Date   WBC 3.1 (L) 03/03/2024   HGB 10.4 (L) 03/03/2024   HCT 32.5 (L) 03/03/2024   MCV 79.7 (L) 03/03/2024   PLT 149 (L) 03/03/2024   Recent Labs    02/18/24 0800 02/25/24 0832 03/03/24 0840  NA 140 142 139  K 3.4* 3.5 3.9  CL 101 104 100  CO2 31 32 31  GLUCOSE 124* 175* 169*  BUN 14 16 19   CREATININE 0.64 0.50 0.60  CALCIUM  9.3 9.3 9.1  GFRNONAA >60 >60 >60  PROT 6.2* 6.3* 6.1*  ALBUMIN 3.5 3.4* 3.6  AST 16 17 19   ALT 12 13 15   ALKPHOS 77 80 74  BILITOT 0.5 0.2 0.4    RADIOGRAPHIC STUDIES: I have personally reviewed the radiological images as listed and agreed with the findings in the report. No results found.

## 2024-03-06 ENCOUNTER — Ambulatory Visit
Admission: RE | Admit: 2024-03-06 | Discharge: 2024-03-06 | Disposition: A | Discharge status: Home / Self Care | Source: Ambulatory Visit | Attending: Radiation Oncology | Admitting: Radiation Oncology

## 2024-03-06 ENCOUNTER — Other Ambulatory Visit: Payer: Self-pay

## 2024-03-06 DIAGNOSIS — Z5112 Encounter for antineoplastic immunotherapy: Secondary | ICD-10-CM | POA: Diagnosis not present

## 2024-03-06 LAB — RAD ONC ARIA SESSION SUMMARY
Course Elapsed Days: 35
Plan Fractions Treated to Date: 26
Plan Prescribed Dose Per Fraction: 2 Gy
Plan Total Fractions Prescribed: 33
Plan Total Prescribed Dose: 66 Gy
Reference Point Dosage Given to Date: 52 Gy
Reference Point Session Dosage Given: 2 Gy
Session Number: 26

## 2024-03-07 ENCOUNTER — Other Ambulatory Visit: Payer: Self-pay

## 2024-03-07 ENCOUNTER — Ambulatory Visit
Admission: RE | Admit: 2024-03-07 | Discharge: 2024-03-07 | Disposition: A | Discharge status: Home / Self Care | Source: Ambulatory Visit | Attending: Radiation Oncology | Admitting: Radiation Oncology

## 2024-03-07 DIAGNOSIS — Z5112 Encounter for antineoplastic immunotherapy: Secondary | ICD-10-CM | POA: Diagnosis not present

## 2024-03-07 LAB — RAD ONC ARIA SESSION SUMMARY
Course Elapsed Days: 36
Plan Fractions Treated to Date: 27
Plan Prescribed Dose Per Fraction: 2 Gy
Plan Total Fractions Prescribed: 33
Plan Total Prescribed Dose: 66 Gy
Reference Point Dosage Given to Date: 54 Gy
Reference Point Session Dosage Given: 2 Gy
Session Number: 27

## 2024-03-08 ENCOUNTER — Other Ambulatory Visit: Payer: Self-pay

## 2024-03-08 ENCOUNTER — Ambulatory Visit
Admission: RE | Admit: 2024-03-08 | Discharge: 2024-03-08 | Disposition: A | Discharge status: Home / Self Care | Source: Ambulatory Visit | Attending: Radiation Oncology | Admitting: Radiation Oncology

## 2024-03-08 DIAGNOSIS — Z5112 Encounter for antineoplastic immunotherapy: Secondary | ICD-10-CM | POA: Diagnosis not present

## 2024-03-08 LAB — RAD ONC ARIA SESSION SUMMARY
Course Elapsed Days: 37
Plan Fractions Treated to Date: 28
Plan Prescribed Dose Per Fraction: 2 Gy
Plan Total Fractions Prescribed: 33
Plan Total Prescribed Dose: 66 Gy
Reference Point Dosage Given to Date: 56 Gy
Reference Point Session Dosage Given: 2 Gy
Session Number: 28

## 2024-03-09 ENCOUNTER — Other Ambulatory Visit: Payer: Self-pay

## 2024-03-09 ENCOUNTER — Encounter: Payer: Self-pay | Admitting: Internal Medicine

## 2024-03-09 ENCOUNTER — Ambulatory Visit
Admission: RE | Admit: 2024-03-09 | Discharge: 2024-03-09 | Disposition: A | Discharge status: Home / Self Care | Source: Ambulatory Visit | Attending: Radiation Oncology | Admitting: Radiation Oncology

## 2024-03-09 DIAGNOSIS — Z5112 Encounter for antineoplastic immunotherapy: Secondary | ICD-10-CM | POA: Diagnosis not present

## 2024-03-09 LAB — RAD ONC ARIA SESSION SUMMARY
Course Elapsed Days: 38
Plan Fractions Treated to Date: 29
Plan Prescribed Dose Per Fraction: 2 Gy
Plan Total Fractions Prescribed: 33
Plan Total Prescribed Dose: 66 Gy
Reference Point Dosage Given to Date: 58 Gy
Reference Point Session Dosage Given: 2 Gy
Session Number: 29

## 2024-03-10 ENCOUNTER — Inpatient Hospital Stay

## 2024-03-10 ENCOUNTER — Inpatient Hospital Stay (HOSPITAL_BASED_OUTPATIENT_CLINIC_OR_DEPARTMENT_OTHER): Admitting: Internal Medicine

## 2024-03-10 ENCOUNTER — Encounter: Payer: Self-pay | Admitting: Internal Medicine

## 2024-03-10 ENCOUNTER — Telehealth: Payer: Self-pay | Admitting: *Deleted

## 2024-03-10 ENCOUNTER — Ambulatory Visit
Admission: RE | Admit: 2024-03-10 | Discharge: 2024-03-10 | Disposition: A | Discharge status: Home / Self Care | Source: Ambulatory Visit | Attending: Radiation Oncology | Admitting: Radiation Oncology

## 2024-03-10 ENCOUNTER — Other Ambulatory Visit: Payer: Self-pay

## 2024-03-10 VITALS — BP 123/75 | HR 100 | Temp 97.0°F | Resp 16

## 2024-03-10 VITALS — BP 124/74 | HR 100 | Resp 16

## 2024-03-10 DIAGNOSIS — D509 Iron deficiency anemia, unspecified: Secondary | ICD-10-CM

## 2024-03-10 DIAGNOSIS — Z5111 Encounter for antineoplastic chemotherapy: Secondary | ICD-10-CM | POA: Diagnosis not present

## 2024-03-10 DIAGNOSIS — C3411 Malignant neoplasm of upper lobe, right bronchus or lung: Secondary | ICD-10-CM

## 2024-03-10 DIAGNOSIS — Z5112 Encounter for antineoplastic immunotherapy: Secondary | ICD-10-CM | POA: Diagnosis not present

## 2024-03-10 LAB — CBC WITH DIFFERENTIAL (CANCER CENTER ONLY)
Abs Immature Granulocytes: 0.03 10*3/uL (ref 0.00–0.07)
Basophils Absolute: 0 10*3/uL (ref 0.0–0.1)
Basophils Relative: 0 %
Eosinophils Absolute: 0 10*3/uL (ref 0.0–0.5)
Eosinophils Relative: 0 %
HCT: 31 % — ABNORMAL LOW (ref 36.0–46.0)
Hemoglobin: 9.9 g/dL — ABNORMAL LOW (ref 12.0–15.0)
Immature Granulocytes: 1 %
Lymphocytes Relative: 12 %
Lymphs Abs: 0.3 10*3/uL — ABNORMAL LOW (ref 0.7–4.0)
MCH: 25.6 pg — ABNORMAL LOW (ref 26.0–34.0)
MCHC: 31.9 g/dL (ref 30.0–36.0)
MCV: 80.1 fL (ref 80.0–100.0)
Monocytes Absolute: 0.2 10*3/uL (ref 0.1–1.0)
Monocytes Relative: 8 %
Neutro Abs: 2 10*3/uL (ref 1.7–7.7)
Neutrophils Relative %: 79 %
Platelet Count: 149 10*3/uL — ABNORMAL LOW (ref 150–400)
RBC: 3.87 MIL/uL (ref 3.87–5.11)
RDW: 19.3 % — ABNORMAL HIGH (ref 11.5–15.5)
WBC Count: 2.6 10*3/uL — ABNORMAL LOW (ref 4.0–10.5)
nRBC: 0 % (ref 0.0–0.2)

## 2024-03-10 LAB — CMP (CANCER CENTER ONLY)
ALT: 15 U/L (ref 0–44)
AST: 18 U/L (ref 15–41)
Albumin: 3.5 g/dL (ref 3.5–5.0)
Alkaline Phosphatase: 80 U/L (ref 38–126)
Anion gap: 8 (ref 5–15)
BUN: 15 mg/dL (ref 8–23)
CO2: 30 mmol/L (ref 22–32)
Calcium: 9.2 mg/dL (ref 8.9–10.3)
Chloride: 102 mmol/L (ref 98–111)
Creatinine: 0.62 mg/dL (ref 0.44–1.00)
GFR, Estimated: >60 mL/min (ref >60)
Glucose, Bld: 163 mg/dL — ABNORMAL HIGH (ref 70–99)
Potassium: 3.4 mmol/L — ABNORMAL LOW (ref 3.5–5.1)
Sodium: 140 mmol/L (ref 135–145)
Total Bilirubin: 0.6 mg/dL (ref 0.0–1.2)
Total Protein: 5.9 g/dL — ABNORMAL LOW (ref 6.5–8.1)

## 2024-03-10 LAB — RAD ONC ARIA SESSION SUMMARY
Course Elapsed Days: 39
Plan Fractions Treated to Date: 30
Plan Prescribed Dose Per Fraction: 2 Gy
Plan Total Fractions Prescribed: 33
Plan Total Prescribed Dose: 66 Gy
Reference Point Dosage Given to Date: 60 Gy
Reference Point Session Dosage Given: 2 Gy
Session Number: 30

## 2024-03-10 MED ORDER — SODIUM CHLORIDE 0.9 % IV SOLN
178.2000 mg | Freq: Once | INTRAVENOUS | Status: AC
  Administered 2024-03-10: 180 mg via INTRAVENOUS
  Filled 2024-03-10: qty 18

## 2024-03-10 MED ORDER — PALONOSETRON HCL INJECTION 0.25 MG/5ML
0.2500 mg | Freq: Once | INTRAVENOUS | Status: AC
  Administered 2024-03-10: 0.25 mg via INTRAVENOUS
  Filled 2024-03-10: qty 5

## 2024-03-10 MED ORDER — FAMOTIDINE IN NACL 20-0.9 MG/50ML-% IV SOLN
20.0000 mg | Freq: Once | INTRAVENOUS | Status: AC
  Administered 2024-03-10: 20 mg via INTRAVENOUS
  Filled 2024-03-10: qty 50

## 2024-03-10 MED ORDER — SODIUM CHLORIDE 0.9 % IV SOLN
INTRAVENOUS | Status: DC
  Filled 2024-03-10: qty 250

## 2024-03-10 MED ORDER — SODIUM CHLORIDE 0.9 % IV SOLN
45.0000 mg/m2 | Freq: Once | INTRAVENOUS | Status: AC
  Administered 2024-03-10: 84 mg via INTRAVENOUS
  Filled 2024-03-10: qty 14

## 2024-03-10 MED ORDER — SODIUM CHLORIDE 0.9% FLUSH
10.0000 mL | INTRAVENOUS | Status: DC | PRN
  Administered 2024-03-10: 10 mL
  Filled 2024-03-10: qty 10

## 2024-03-10 MED ORDER — HEPARIN SOD (PORK) LOCK FLUSH 100 UNIT/ML IV SOLN
500.0000 [IU] | Freq: Once | INTRAVENOUS | Status: AC | PRN
  Administered 2024-03-10: 500 [IU]
  Filled 2024-03-10: qty 5

## 2024-03-10 MED ORDER — DEXAMETHASONE SODIUM PHOSPHATE 10 MG/ML IJ SOLN
10.0000 mg | Freq: Once | INTRAMUSCULAR | Status: AC
  Administered 2024-03-10: 10 mg via INTRAVENOUS
  Filled 2024-03-10: qty 1

## 2024-03-10 MED ORDER — DIPHENHYDRAMINE HCL 50 MG/ML IJ SOLN
50.0000 mg | Freq: Once | INTRAMUSCULAR | Status: AC
  Administered 2024-03-10: 50 mg via INTRAVENOUS
  Filled 2024-03-10: qty 1

## 2024-03-10 MED ORDER — IRON SUCROSE 20 MG/ML IV SOLN
200.0000 mg | Freq: Once | INTRAVENOUS | Status: AC
  Administered 2024-03-10: 200 mg via INTRAVENOUS
  Filled 2024-03-10: qty 10

## 2024-03-10 NOTE — Telephone Encounter (Signed)
 The daughter said probably could be where they send you the message about when the appointment is going to be but it is an automatic thing and not from an actual person calling and doing it.  She says that she just wanted to make sure it was not any something that is big or a problem and she sees all the appointments for the patient on her MyChart

## 2024-03-10 NOTE — Progress Notes (Signed)
 [facility] CONSULT NOTE  Patient Care Team: Center, [facility] as PCP - General (General Practice) Burnie Cartwright, RN as Registered Nurse Arlette Benders, RN (Inactive) as Registered Nurse Chemeka Filice, MD as Consulting Physician (Oncology) Drake Gens, RN as Oncology Nurse Navigator Glenis Langdon, MD as Consulting Physician (Radiation Oncology)  Reason for visit- right upper lobe lung non-small cell cancer.  CANCER STAGING   Cancer Staging  Cancer of upper lobe of right lung [facility]) Staging form: Lung, AJCC V9 - Clinical stage from 11/12/2023: Stage IIIA (cT3, cN1, cM0) - Signed by Madellyn Denio, MD on 01/07/2024   ASSESSMENT & PLAN:  Michele House 68 y.o. 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  # RUL NSCLC, Stage IIIA - Status post EBUS with biopsy with Dr.Aleskerov.  Right upper lobe lung biopsy showed non-small cell carcinoma.  Coexpress is TTF-1 and p40.  With TTF-1 expression, could be adenocarcinoma.  Insufficient material for ancillary molecular testing. RUL bronchial lavage positive for malignancy. Station 7 lymph node negative for malignancy.  -PET scan from April 22, 2024 showed 1.8 cm nodule in the right upper lobe with SUV of 37.5.  6 mm nodule more superiorly hypermetabolic for size could be satellite lesion.  There are several small hypermetabolic right hilar lymph nodes largest with SUV of 16.  No evidence of metastatic disease otherwise.  -Started on radiation on 01/31/2024.  Labs reviewed and acceptable for treatment.   Will proceed with cycle 6 of carboplatin  AUC 2 and Taxol  45 mg/m2 which will be the last chemo treatment and transition to Durvalumab maintenance 1500 mg every 28 days cycle for total of 1 year.  Patient denies any history of autoimmune conditions.  Scheduled for CT chest  with contrast in 4 weeks to assess treatment response.  -MRI brain with and without contrast negative for metastasis.   - Liquid Tempus CT DNA less than 0.25%.  TMB 4.4, MSI-S.  No targetable mutations.  # COPD on 2 L oxygen -Continue follow-up with pulmonary  # Iron  deficiency anemia -Hemoglobin 10.6.  Ferritin 37 and saturation 10%. -Schedule for IV Venofer  200 mg weekly x 5 doses.  # Constipation -Continue with Colace.  Mild improvement.  Add Senokot 2 tabs daily as needed. - Does not prefer MiraLAX .  # History of GI bleed -s/p EGD which showed bleeding duodenal ulcer with clip placed 10/28/2023.  - s/p IR embolization on 10/30/2023.  Plavix  was discontinued.  She was continued on baby aspirin .  # History of DVT - Had bilateral lower extremity Dopplers for leg pain.  Showed right calf DVT.   - Status post IVC filter on 11/09/2023.  RTC in 2 weeks for MD visit, labs, Durvalumab.  Orders Placed This Encounter  Procedures   CT Chest W Contrast    Standing Status:   Future    Expected Date:   04/03/2024    Expiration Date:   03/10/2025    If indicated for the ordered procedure, I authorize the administration of contrast media per Radiology protocol:   Yes    Does the patient have a contrast media/X-ray dye allergy?:   No    Preferred imaging location?:   [facility]    The total time spent in the appointment was 30 minutes encounter with patients including review of chart and various tests results, discussions  about plan of care and coordination of care plan   All questions were answered. The patient knows to call the clinic with any problems, questions or concerns. No barriers to learning was detected.  Michele Rodney, MD 5/9/20259:42 AM   HISTORY OF PRESENTING ILLNESS:  Michele House 68 y.o. female with pmh of COPD, on 2 L oxygen, hypertension, type 2 diabetes, CHF referred to medical oncology for diagnosis of right upper lobe lung non-small cell cancer favoring  adenocarcinoma.  Patient had long complicated hospital course from 10/02/2023 to 11/15/2023.  Presented with COPD exacerbation, pneumonia, UTI and encephalopathy.  Requiring intubation.   CTA chest from 09/24/2023 showed increase in size of spiculated pulmonary nodule in the inferior right upper lobe 1.6 cm compared to 1.3 cm previously.  Initial lung biopsy on 10/20/2023 was nondiagnostic.    Repeat EBUS with biopsy with Dr.Aleskerov on 11/12/2023.  Right upper lobe lung biopsy showed non-small cell carcinoma.  Coexpress is TTF-1 and p40.  Insufficient material for ancillary molecular testing. RUL bronchial lavage positive for malignancy. Station 7 lymph node negative for malignancy.  Hospital course complicated by encephalopathy.  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  EEG showed diffuse slowing without seizures.  LP unremarkable.  Autoimmune/paraneoplastic panel sent.  Treated with 5 days of high-dose Solu-Medrol  with not much improvement.  Also developed GI bleed causing hemorrhagic shock. s/p EGD which showed bleeding duodenal ulcer with clip placed 10/28/2023. s/p IR embolization on 10/30/2023.  Plavix  was discontinued.  She was continued on baby aspirin .  Had bilateral lower extremity Dopplers for leg pain.  Showed right calf DVT.  Status post IVC filter on 11/09/2023.  Interval history Patient was seen today in the clinic accompanied with daughter.  On concurrent chemo RT for stage IIIa lung cancer. She is tolerating treatment well overall.  Does report episode of dizziness on standing up.  Denies any nausea, vomiting.  Appetite is good.  Denies any changes with neuropathy.  I have reviewed her chart and materials related to her cancer extensively and collaborated history with the patient. Summary of  oncologic history is as follows: Oncology History  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)  11/12/2023 Cancer Staging   Staging form: Lung, AJCC V9 - Clinical stage from 11/12/2023: Stage IIIA (cT3, cN1, cM0) - Signed by Michele Rodney, MD on 01/07/2024   12/17/2023 Initial Diagnosis   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)   02/04/2024 -  Chemotherapy   Patient is on Treatment Plan : LUNG Carboplatin  + Paclitaxel  + XRT q7d       MEDICAL HISTORY:  Past Medical History:  Diagnosis Date   Acid reflux    COPD (chronic obstructive pulmonary disease) (HCC)    Diabetes mellitus without complication (HCC)    Hyperlipidemia    Hypertension    Thyroid  disease     SURGICAL HISTORY: Past Surgical History:  Procedure Laterality Date   appendectomy     APPENDECTOMY     BREAST BIOPSY Right    CORE W/CLIP - NEG   ESOPHAGOGASTRODUODENOSCOPY N/A 10/28/2023   Procedure: ESOPHAGOGASTRODUODENOSCOPY (EGD);  Surgeon: Marnee Sink, MD;  Location: [facility] ENDOSCOPY;  Service: Endoscopy;  Laterality: N/A;   FLEXIBLE BRONCHOSCOPY Bilateral 11/12/2023   Procedure: FLEXIBLE BRONCHOSCOPY;  Surgeon: Erskin Hearing, MD;  Location: [facility] ORS;  Service: Thoracic;  Laterality:  Bilateral;   IR EMBO ART  VEN HEMORR LYMPH EXTRAV  INC GUIDE ROADMAPPING  10/30/2023   IR IMAGING GUIDED PORT INSERTION  01/18/2024   IVC FILTER INSERTION N/A 11/09/2023   Procedure: IVC FILTER INSERTION;  Surgeon: Celso College, MD;  Location: [facility] INVASIVE CV LAB;  Service: Cardiovascular;  Laterality: N/A;   TOTAL VAGINAL HYSTERECTOMY     TUMOR REMOVAL     benign;stomach   VIDEO BRONCHOSCOPY WITH ENDOBRONCHIAL NAVIGATION Right 10/20/2023   Procedure: VIDEO BRONCHOSCOPY WITH ENDOBRONCHIAL NAVIGATION;  Surgeon: Erskin Hearing, MD;  Location: [facility] ORS;  Service: Thoracic;  Laterality: Right;   VIDEO BRONCHOSCOPY WITH ENDOBRONCHIAL NAVIGATION Right 11/12/2023   Procedure: VIDEO BRONCHOSCOPY WITH ENDOBRONCHIAL NAVIGATION;  Surgeon:  Erskin Hearing, MD;  Location: [facility] ORS;  Service: Thoracic;  Laterality: Right;    SOCIAL HISTORY: Social History   Socioeconomic History   Marital status: Single    Spouse name: Not on file   Number of children: Not on file   Years of education: Not on file   Highest education level: Not on file  Occupational History   Not on file  Tobacco Use   Smoking status: Former    Current packs/day: 0.00    Types: Cigarettes    Quit date: 12/15/1985    Years since quitting: 38.2   Smokeless tobacco: Never  Substance and Sexual Activity   Alcohol use: No   Drug use: No   Sexual activity: Not Currently  Other Topics Concern   Not on file  Social History Narrative   Not on file   Social Drivers of Health   Financial Resource Strain: Not on file  Food Insecurity: Patient Unable To Answer (10/03/2023)   Hunger Vital Sign    Worried About Running Out of Food in the Last Year: Patient unable to answer    Ran Out of Food in the Last Year: Patient unable to answer  Recent Concern: Food Insecurity - Food Insecurity Present (09/24/2023)   Hunger Vital Sign    Worried About Running Out of Food in the Last Year: Sometimes true    Ran Out of Food in the Last Year: Sometimes true  Transportation Needs: Patient Unable To Answer (10/03/2023)   PRAPARE - Administrator, Civil Service (Medical): Patient unable to answer    Lack of Transportation (Non-Medical): Patient unable to answer  Recent Concern: Transportation Needs - Unmet Transportation Needs (09/24/2023)   PRAPARE - Administrator, Civil Service (Medical): Yes    Lack of Transportation (Non-Medical): Yes  Physical Activity: Not on file  Stress: Not on file  Social Connections: Patient Declined (11/02/2023)   Social Connection and Isolation Panel [NHANES]    Frequency of Communication with Friends and Family: Patient declined    Frequency of Social Gatherings with Friends and Family: Patient declined    Attends  Religious Services: Patient declined    Database administrator or Organizations: Patient declined    Attends Banker Meetings: Patient declined    Marital Status: Patient declined  Intimate Partner Violence: Patient Unable To Answer (10/03/2023)   Humiliation, Afraid, Rape, and Kick questionnaire    Fear of Current or Ex-Partner: Patient unable to answer    Emotionally Abused: Patient unable to answer    Physically Abused: Patient unable to answer    Sexually Abused: Patient unable to answer    FAMILY HISTORY: Family History  Problem Relation Age of Onset   Heart attack Mother  Hypertension Mother    Breast cancer Sister 72   Breast cancer Maternal Aunt        40'S   Breast cancer Maternal Grandmother     ALLERGIES:  is allergic to penicillins, aspirin , and sulfa antibiotics.  MEDICATIONS:  Current Outpatient Medications  Medication Sig Dispense Refill   acetaminophen  (TYLENOL ) 325 MG tablet Take 650 mg by mouth every 6 (six) hours as needed.     aspirin  EC 81 MG tablet Take 81 mg by mouth daily.     atorvastatin  (LIPITOR) 20 MG tablet Take 20 mg by mouth daily.     calcium  carbonate (TUMS - DOSED IN MG ELEMENTAL CALCIUM ) 500 MG chewable tablet Chew 1 tablet by mouth daily.     cetirizine (ZYRTEC) 10 MG tablet Take 10 mg by mouth daily.     COMBIVENT  RESPIMAT 20-100 MCG/ACT AERS respimat Inhale into the lungs every 6 (six) hours as needed for wheezing or shortness of breath.     dexamethasone  (DECADRON ) 4 MG tablet Take 2 tablets daily for 2 days, start the day after chemotherapy. Take with food. 30 tablet 1   diclofenac sodium (VOLTAREN) 1 % GEL Apply topically 4 (four) times daily.     docusate sodium  (COLACE) 100 MG capsule Take 1 capsule (100 mg total) by mouth 2 (two) times daily.     DULERA 200-5 MCG/ACT AERO Inhale 2 puffs into the lungs every 12 (twelve) hours.     furosemide  (LASIX ) 20 MG tablet Take 1 tablet (20 mg total) by mouth daily. 30 tablet 0    glipiZIDE  (GLUCOTROL ) 10 MG tablet Take 10 mg by mouth daily before breakfast.     insulin  aspart (NOVOLOG ) 100 UNIT/ML injection Inject 4 Units into the skin 3 (three) times daily with meals.     insulin  glargine-yfgn (SEMGLEE ) 100 UNIT/ML injection Inject 0.18 mLs (18 Units total) into the skin daily.     ipratropium (ATROVENT HFA) 17 MCG/ACT inhaler Inhale 2 puffs into the lungs every 6 (six) hours.     levETIRAcetam  (KEPPRA ) 500 MG tablet Take 1 tablet (500 mg total) by mouth 2 (two) times daily.     levothyroxine  (SYNTHROID ) 150 MCG tablet Take 1 tablet (150 mcg total) by mouth daily before breakfast. Increased from 100.     lidocaine -prilocaine  (EMLA ) cream Apply to affected area once 30 g 3   [Paused] lisinopril -hydrochlorothiazide  (PRINZIDE ,ZESTORETIC ) 20-25 MG per tablet Take 1 tablet by mouth daily.     loperamide (IMODIUM A-D) 2 MG tablet Take 2 mg by mouth 4 (four) times daily as needed for diarrhea or loose stools. (Patient not taking: Reported on 03/03/2024)     LORazepam  (ATIVAN ) 0.5 MG tablet Take 0.5 mg by mouth every 8 (eight) hours as needed for anxiety.     Multiple Vitamin (MULTIVITAMIN) capsule Take 1 capsule by mouth daily.     naloxone (NARCAN) nasal spray 4 mg/0.1 mL Place 1 spray into the nose. (Patient not taking: Reported on 03/03/2024)     ondansetron  (ZOFRAN ) 4 MG/5ML solution Take by mouth once. (Patient not taking: Reported on 03/03/2024)     ondansetron  (ZOFRAN ) 8 MG tablet Take 1 tablet (8 mg total) by mouth every 8 (eight) hours as needed for nausea or vomiting. Start on the third day after chemotherapy. (Patient not taking: Reported on 03/03/2024) 30 tablet 1   pantoprazole  (PROTONIX ) 40 MG tablet Take 1 tablet (40 mg total) by mouth 2 (two) times daily before a meal.     polyethylene  glycol (MIRALAX  / GLYCOLAX ) 17 g packet Take 17 g by mouth daily. (Patient not taking: Reported on 03/03/2024)     pregabalin  (LYRICA ) 50 MG capsule Take 50 mg by mouth 2 (two) times daily.      prochlorperazine  (COMPAZINE ) 10 MG tablet Take 1 tablet (10 mg total) by mouth every 6 (six) hours as needed for nausea or vomiting. 30 tablet 1   rOPINIRole  (REQUIP  XL) 4 MG 24 hr tablet Take 1 tablet (4 mg total) by mouth at bedtime.     senna (SENOKOT) 8.6 MG TABS tablet Take 1 tablet (8.6 mg total) by mouth in the morning and at bedtime. 120 tablet 3   sertraline  (ZOLOFT ) 100 MG tablet Take 100 mg by mouth daily.     TRADJENTA  5 MG TABS tablet Take 5 mg by mouth daily.     No current facility-administered medications for this visit.   Facility-Administered Medications Ordered in Other Visits  Medication Dose Route Frequency Provider Last Rate Last Admin   oxyCODONE  (Oxy IR/ROXICODONE ) immediate release tablet 5 mg  5 mg Oral Once Mister Krahenbuhl, MD        REVIEW OF SYSTEMS:   Pertinent information mentioned in HPI All other systems were reviewed with the patient and are negative.  PHYSICAL EXAMINATION: ECOG PERFORMANCE STATUS: 3 - Symptomatic, >50% confined to bed  Vitals:   03/10/24 0858  BP: 123/75  Pulse: 100  Resp: 16  Temp: (!) 97 F (36.1 C)  SpO2: 100%   There were no vitals filed for this visit.   GENERAL:alert, no distress and comfortable SKIN: skin color, texture, turgor are normal, no rashes or significant lesions EYES: normal, conjunctiva are pink and non-injected, sclera clear OROPHARYNX:no exudate, no erythema and lips, buccal mucosa, and tongue normal  NECK: supple, thyroid  normal size, non-tender, without nodularity LYMPH:  no palpable lymphadenopathy in the cervical, axillary or inguinal LUNGS: clear to auscultation and percussion with normal breathing effort HEART: regular rate & rhythm and no murmurs and no lower extremity edema ABDOMEN:abdomen soft, non-tender and normal bowel sounds Musculoskeletal:no cyanosis of digits and no clubbing  PSYCH: alert & oriented x 3 with fluent speech NEURO: no focal motor/sensory deficits  LABORATORY DATA:  I  have reviewed the data as listed Lab Results  Component Value Date   WBC 2.6 (L) 03/10/2024   HGB 9.9 (L) 03/10/2024   HCT 31.0 (L) 03/10/2024   MCV 80.1 03/10/2024   PLT 149 (L) 03/10/2024   Recent Labs    02/25/24 0832 03/03/24 0840 03/10/24 0832  NA 142 139 140  K 3.5 3.9 3.4*  CL 104 100 102  CO2 32 31 30  GLUCOSE 175* 169* 163*  BUN 16 19 15   CREATININE 0.50 0.60 0.62  CALCIUM  9.3 9.1 9.2  GFRNONAA >60 >60 >60  PROT 6.3* 6.1* 5.9*  ALBUMIN 3.4* 3.6 3.5  AST 17 19 18   ALT 13 15 15   ALKPHOS 80 74 80  BILITOT 0.2 0.4 0.6    RADIOGRAPHIC STUDIES: I have personally reviewed the radiological images as listed and agreed with the findings in the report. No results found.

## 2024-03-10 NOTE — Progress Notes (Signed)
 Patient is doing ok, treatment is going ok, no new questions or concerns for the doctor today.

## 2024-03-10 NOTE — Progress Notes (Signed)
 Pharmacist Chemotherapy Monitoring - Initial Assessment    Anticipated start date: 03/25/24  The following has been reviewed per standard work regarding the patient's treatment regimen: The patient's diagnosis, treatment plan and drug doses, and organ/hematologic function Lab orders and baseline tests specific to treatment regimen  The treatment plan start date, drug sequencing, and pre-medications Prior authorization status  Patient's documented medication list, including drug-drug interaction screen and prescriptions for anti-emetics and supportive care specific to the treatment regimen The drug concentrations, fluid compatibility, administration routes, and timing of the medications to be used The patient's access for treatment and lifetime cumulative dose history, if applicable  The patient's medication allergies and previous infusion related reactions, if applicable   Changes made to treatment plan:  -Started on radiation on 01/31/2024.  Labs reviewed and acceptable for treatment.   Will proceed with cycle 6 of carboplatin  AUC 2 and Taxol  45 mg/m2 which will be the last chemo treatment and transition to Durvalumab maintenance 1500 mg every 28 days cycle for total of 1 year.  Patient denies any history of autoimmune conditions.  Scheduled for CT chest with contrast in 4 weeks to assess treatment response.   Follow up needed:  Starting durvalumab 1500mg   03/26/24   Linnette Panella J Rimas Gilham, RPH, 03/10/2024  11:58 AM

## 2024-03-10 NOTE — Progress Notes (Signed)
 DISCONTINUE ON PATHWAY REGIMEN - Non-Small Cell Lung     A cycle is every 7 days, concurrent with RT:     Paclitaxel       Carboplatin    **Always confirm dose/schedule in your pharmacy ordering system**  REASON: Continuation Of Treatment PRIOR TREATMENT: [id number]: Carboplatin  AUC=2 + Paclitaxel  45 mg/m2 Weekly During Radiation TREATMENT RESPONSE: Unable to Evaluate  START ON PATHWAY REGIMEN - Non-Small Cell Lung     A cycle is every 28 days:     Durvalumab   **Always confirm dose/schedule in your pharmacy ordering system**  Patient Characteristics: Preoperative or Nonsurgical Candidate (Clinical Staging), Stage IIB (N2a only) or Stage III - Nonsurgical Candidate, PS = 0,1 Therapeutic Status: Preoperative or Nonsurgical Candidate (Clinical Staging) AJCC T Category: cT3 AJCC N Category: cN1 AJCC M Category: cM0 AJCC 9 Stage Grouping: IIIA Check here if patient was staged using an edition other than AJCC Staging 9th Edition: true ECOG Performance Status: 1 Intent of Therapy: Curative Intent, Discussed with Patient

## 2024-03-13 ENCOUNTER — Other Ambulatory Visit: Payer: Self-pay

## 2024-03-13 ENCOUNTER — Ambulatory Visit
Admission: RE | Admit: 2024-03-13 | Discharge: 2024-03-13 | Disposition: A | Discharge status: Home / Self Care | Source: Ambulatory Visit | Attending: Radiation Oncology | Admitting: Radiation Oncology

## 2024-03-13 DIAGNOSIS — Z5112 Encounter for antineoplastic immunotherapy: Secondary | ICD-10-CM | POA: Diagnosis not present

## 2024-03-13 LAB — RAD ONC ARIA SESSION SUMMARY
Course Elapsed Days: 42
Plan Fractions Treated to Date: 31
Plan Prescribed Dose Per Fraction: 2 Gy
Plan Total Fractions Prescribed: 33
Plan Total Prescribed Dose: 66 Gy
Reference Point Dosage Given to Date: 62 Gy
Reference Point Session Dosage Given: 2 Gy
Session Number: 31

## 2024-03-14 ENCOUNTER — Ambulatory Visit
Admission: RE | Admit: 2024-03-14 | Discharge: 2024-03-14 | Disposition: A | Discharge status: Home / Self Care | Source: Ambulatory Visit | Attending: Radiation Oncology | Admitting: Radiation Oncology

## 2024-03-14 ENCOUNTER — Encounter: Payer: Self-pay | Admitting: Internal Medicine

## 2024-03-14 ENCOUNTER — Other Ambulatory Visit: Payer: Self-pay

## 2024-03-14 DIAGNOSIS — Z5112 Encounter for antineoplastic immunotherapy: Secondary | ICD-10-CM | POA: Diagnosis not present

## 2024-03-14 LAB — RAD ONC ARIA SESSION SUMMARY
Course Elapsed Days: 43
Plan Fractions Treated to Date: 32
Plan Prescribed Dose Per Fraction: 2 Gy
Plan Total Fractions Prescribed: 33
Plan Total Prescribed Dose: 66 Gy
Reference Point Dosage Given to Date: 64 Gy
Reference Point Session Dosage Given: 2 Gy
Session Number: 32

## 2024-03-15 ENCOUNTER — Ambulatory Visit

## 2024-03-15 ENCOUNTER — Other Ambulatory Visit: Payer: Self-pay

## 2024-03-15 ENCOUNTER — Ambulatory Visit
Admission: RE | Admit: 2024-03-15 | Discharge: 2024-03-15 | Disposition: A | Discharge status: Home / Self Care | Source: Ambulatory Visit | Attending: Radiation Oncology | Admitting: Radiation Oncology

## 2024-03-15 DIAGNOSIS — Z5112 Encounter for antineoplastic immunotherapy: Secondary | ICD-10-CM | POA: Diagnosis not present

## 2024-03-15 LAB — RAD ONC ARIA SESSION SUMMARY
Course Elapsed Days: 44
Plan Fractions Treated to Date: 33
Plan Prescribed Dose Per Fraction: 2 Gy
Plan Total Fractions Prescribed: 33
Plan Total Prescribed Dose: 66 Gy
Reference Point Dosage Given to Date: 66 Gy
Reference Point Session Dosage Given: 2 Gy
Session Number: 33

## 2024-03-16 ENCOUNTER — Ambulatory Visit

## 2024-03-16 NOTE — Radiation Completion Notes (Signed)
 Patient Name: Michele House, PANDY MRN: [id number] Date of Birth: 04/03/1956 Referring Physician: Bernarda Bride, M.D. Date of Service: 2024-03-16 Radiation Oncologist: Glenis Langdon, M.D. [facility]                             RADIATION ONCOLOGY END OF TREATMENT NOTE     Diagnosis: C34.11 Malignant neoplasm of upper lobe, right bronchus or lung Staging on 2023-11-12: 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) T=cT3, N=cN1, M=cM0 Intent: Curative     HPI: Patient is a 68 year old female with significant comorbidities including iron  deficiency anemia COPD on 2 L of oxygen history of GI bleed DVT who was found to have a right upper lobe lesion.  Initial CT scan showed persistent d dominant spiculated mass in the inferior right upper lobe.  Is enlarged over time.  PET CT scan showed again hypermetabolic activity in the inferior right upper lobe lesion as as well as a smaller nodule more superior in the right upper lobe which is mildly hypermetabolic and suspicious for satellite lesion.  She also had hypermetabolic right hilar nodes consistent with metastatic disease.  No other evidence of distant metastatic disease was noted.  Patient underwent EBUS by Dr. Kendra Pavy and biopsy was positive for non-small cell lung cancer with coexpression of TTF-1 and p40 suggesting adenocarcinoma.  She is on constant 2 L of nasal oxygen.  She is seen today and is having a cough having some problems bringing up mucus and it she has tried Mucinex  with no significant benefit.  She specifically denies hemoptysis.  She is wheelchair-bound on constant nasal oxygen seen today for radiation oncology opinion.      ==========DELIVERED PLANS==========  First Treatment Date: 2024-01-31 Last Treatment Date: 2024-03-15   Plan Name: Lung_R Site: Lung, Right Technique: IMRT Mode: Photon Dose Per Fraction: 2 Gy Prescribed Dose (Delivered / Prescribed): 66 Gy / 66 Gy Prescribed Fxs (Delivered /  Prescribed): 33 / 33     ==========ON TREATMENT VISIT DATES========== 2024-02-01, 2024-02-08, 2024-02-15, 2024-02-22, 2024-02-29, 2024-03-07, 2024-03-14     ==========UPCOMING VISITS==========       ==========APPENDIX - ON TREATMENT VISIT NOTES==========   See weekly On Treatment Notes in Epic for details in the Media tab (listed as Progress notes on the On Treatment Visit Dates listed above).

## 2024-03-21 ENCOUNTER — Encounter: Payer: Self-pay | Admitting: Internal Medicine

## 2024-03-22 ENCOUNTER — Ambulatory Visit
Admission: RE | Admit: 2024-03-22 | Discharge: 2024-03-22 | Disposition: A | Discharge status: Home / Self Care | Source: Ambulatory Visit | Attending: Internal Medicine | Admitting: Internal Medicine

## 2024-03-22 DIAGNOSIS — C3411 Malignant neoplasm of upper lobe, right bronchus or lung: Secondary | ICD-10-CM | POA: Diagnosis present

## 2024-03-22 MED ORDER — IOHEXOL 300 MG/ML  SOLN
75.0000 mL | Freq: Once | INTRAMUSCULAR | Status: AC | PRN
  Administered 2024-03-22: 75 mL via INTRAVENOUS

## 2024-03-24 ENCOUNTER — Inpatient Hospital Stay (HOSPITAL_BASED_OUTPATIENT_CLINIC_OR_DEPARTMENT_OTHER): Admitting: Oncology

## 2024-03-24 ENCOUNTER — Inpatient Hospital Stay

## 2024-03-24 ENCOUNTER — Encounter: Payer: Self-pay | Admitting: Internal Medicine

## 2024-03-24 ENCOUNTER — Encounter: Payer: Self-pay | Admitting: Oncology

## 2024-03-24 VITALS — BP 142/70 | HR 109 | Temp 97.5°F | Resp 16 | Ht 61.0 in | Wt 149.7 lb

## 2024-03-24 DIAGNOSIS — C3411 Malignant neoplasm of upper lobe, right bronchus or lung: Secondary | ICD-10-CM | POA: Diagnosis not present

## 2024-03-24 DIAGNOSIS — Z5112 Encounter for antineoplastic immunotherapy: Secondary | ICD-10-CM | POA: Diagnosis not present

## 2024-03-24 LAB — CMP (CANCER CENTER ONLY)
ALT: 14 U/L (ref 0–44)
AST: 18 U/L (ref 15–41)
Albumin: 3.4 g/dL — ABNORMAL LOW (ref 3.5–5.0)
Alkaline Phosphatase: 91 U/L (ref 38–126)
Anion gap: 10 (ref 5–15)
BUN: 19 mg/dL (ref 8–23)
CO2: 29 mmol/L (ref 22–32)
Calcium: 8.8 mg/dL — ABNORMAL LOW (ref 8.9–10.3)
Chloride: 101 mmol/L (ref 98–111)
Creatinine: 0.77 mg/dL (ref 0.44–1.00)
GFR, Estimated: >60 mL/min (ref >60)
Glucose, Bld: 232 mg/dL — ABNORMAL HIGH (ref 70–99)
Potassium: 3.4 mmol/L — ABNORMAL LOW (ref 3.5–5.1)
Sodium: 140 mmol/L (ref 135–145)
Total Bilirubin: 0.5 mg/dL (ref 0.0–1.2)
Total Protein: 5.8 g/dL — ABNORMAL LOW (ref 6.5–8.1)

## 2024-03-24 LAB — CBC WITH DIFFERENTIAL (CANCER CENTER ONLY)
Abs Immature Granulocytes: 0.05 10*3/uL (ref 0.00–0.07)
Basophils Absolute: 0 10*3/uL (ref 0.0–0.1)
Basophils Relative: 0 %
Eosinophils Absolute: 0 10*3/uL (ref 0.0–0.5)
Eosinophils Relative: 0 %
HCT: 31.1 % — ABNORMAL LOW (ref 36.0–46.0)
Hemoglobin: 10.2 g/dL — ABNORMAL LOW (ref 12.0–15.0)
Immature Granulocytes: 1 %
Lymphocytes Relative: 11 %
Lymphs Abs: 0.4 10*3/uL — ABNORMAL LOW (ref 0.7–4.0)
MCH: 26.8 pg (ref 26.0–34.0)
MCHC: 32.8 g/dL (ref 30.0–36.0)
MCV: 81.8 fL (ref 80.0–100.0)
Monocytes Absolute: 0.4 10*3/uL (ref 0.1–1.0)
Monocytes Relative: 12 %
Neutro Abs: 2.6 10*3/uL (ref 1.7–7.7)
Neutrophils Relative %: 76 %
Platelet Count: 138 10*3/uL — ABNORMAL LOW (ref 150–400)
RBC: 3.8 MIL/uL — ABNORMAL LOW (ref 3.87–5.11)
RDW: 21.9 % — ABNORMAL HIGH (ref 11.5–15.5)
WBC Count: 3.5 10*3/uL — ABNORMAL LOW (ref 4.0–10.5)
nRBC: 0 % (ref 0.0–0.2)

## 2024-03-24 LAB — TSH: TSH: 1.295 u[IU]/mL (ref 0.350–4.500)

## 2024-03-24 MED ORDER — HEPARIN SOD (PORK) LOCK FLUSH 100 UNIT/ML IV SOLN
500.0000 [IU] | Freq: Once | INTRAVENOUS | Status: AC | PRN
  Administered 2024-03-24: 500 [IU]
  Filled 2024-03-24: qty 5

## 2024-03-24 MED ORDER — SODIUM CHLORIDE 0.9 % IV SOLN
INTRAVENOUS | Status: DC
  Filled 2024-03-24: qty 250

## 2024-03-24 MED ORDER — SODIUM CHLORIDE 0.9 % IV SOLN
1500.0000 mg | Freq: Once | INTRAVENOUS | Status: AC
  Administered 2024-03-24: 1500 mg via INTRAVENOUS
  Filled 2024-03-24: qty 30

## 2024-03-24 NOTE — Patient Instructions (Signed)
 [facility] - A DEPT OF [facility]  Discharge Instructions: Thank you for choosing [facility] to provide your oncology and hematology care.  If you have a lab appointment with the Cancer Center, please go directly to the Cancer Center and check in at the registration area.  Wear comfortable clothing and clothing appropriate for easy access to any Portacath or PICC line.   We strive to give you quality time with your provider. You may need to reschedule your appointment if you arrive late (15 or more minutes).  Arriving late affects you and other patients whose appointments are after yours.  Also, if you miss three or more appointments without notifying the office, you may be dismissed from the clinic at the provider's discretion.      For prescription refill requests, have your pharmacy contact our office and allow 72 hours for refills to be completed.    Today you received the following chemotherapy and/or immunotherapy agents IMFINZI      To help prevent nausea and vomiting after your treatment, we encourage you to take your nausea medication as directed.  BELOW ARE SYMPTOMS THAT SHOULD BE REPORTED IMMEDIATELY: *FEVER GREATER THAN 100.4 F (38 C) OR HIGHER *CHILLS OR SWEATING *NAUSEA AND VOMITING THAT IS NOT CONTROLLED WITH YOUR NAUSEA MEDICATION *UNUSUAL SHORTNESS OF BREATH *UNUSUAL BRUISING OR BLEEDING *URINARY PROBLEMS (pain or burning when urinating, or frequent urination) *BOWEL PROBLEMS (unusual diarrhea, constipation, pain near the anus) TENDERNESS IN MOUTH AND THROAT WITH OR WITHOUT PRESENCE OF ULCERS (sore throat, sores in mouth, or a toothache) UNUSUAL RASH, SWELLING OR PAIN  UNUSUAL VAGINAL DISCHARGE OR ITCHING   Items with * indicate a potential emergency and should be followed up as soon as possible or go to the Emergency Department if any problems should occur.  Please show the CHEMOTHERAPY ALERT CARD or IMMUNOTHERAPY  ALERT CARD at check-in to the Emergency Department and triage nurse.  Should you have questions after your visit or need to cancel or reschedule your appointment, please contact [facility] - A DEPT OF [facility]  [phone] and follow the prompts.  Office hours are 8:00 a.m. to 4:30 p.m. Monday - Friday. Please note that voicemails left after 4:00 p.m. may not be returned until the following business day.  We are closed weekends and major holidays. You have access to a nurse at all times for urgent questions. Please call the main number to the clinic [phone] and follow the prompts.  For any non-urgent questions, you may also contact your provider using MyChart. We now offer e-Visits for anyone 30 and older to request care online for non-urgent symptoms. For details visit [email or web address].   Also download the MyChart app! Go to the app store, search "MyChart", open the app, select [facility], and log in with your MyChart username and password.  Durvalumab Injection What is this medication? DURVALUMAB (dur VAL ue mab) treats some types of cancer. It works by helping your immune system slow or stop the spread of cancer cells. It is a monoclonal antibody. This medicine may be used for other purposes; ask your health care provider or pharmacist if you have questions. COMMON BRAND NAME(S): IMFINZI What should I tell my care team before I take this medication? They need to know if you have any of these conditions: Allogeneic stem cell transplant (uses someone else's stem cells) Autoimmune diseases, such as Crohn disease, ulcerative  colitis, lupus History of chest radiation Nervous system problems, such as Guillain-Barre syndrome, myasthenia gravis Organ transplant An unusual or allergic reaction to durvalumab, other medications, foods, dyes, or preservatives Pregnant or trying to get pregnant Breast-feeding How should I use this medication? This  medication is infused into a vein. It is given by your care team in a hospital or clinic setting. A special MedGuide will be given to you before each treatment. Be sure to read this information carefully each time. Talk to your care team about the use of this medication in children. Special care may be needed. Overdosage: If you think you have taken too much of this medicine contact a poison control center or emergency room at once. NOTE: This medicine is only for you. Do not share this medicine with others. What if I miss a dose? Keep appointments for follow-up doses. It is important not to miss your dose. Call your care team if you are unable to keep an appointment. What may interact with this medication? Interactions have not been studied. This list may not describe all possible interactions. Give your health care provider a list of all the medicines, herbs, non-prescription drugs, or dietary supplements you use. Also tell them if you smoke, drink alcohol, or use illegal drugs. Some items may interact with your medicine. What should I watch for while using this medication? Your condition will be monitored carefully while you are receiving this medication. You may need blood work while taking this medication. This medication may cause serious skin reactions. They can happen weeks to months after starting the medication. Contact your care team right away if you notice fevers or flu-like symptoms with a rash. The rash may be red or purple and then turn into blisters or peeling of the skin. You may also notice a red rash with swelling of the face, lips, or lymph nodes in your neck or under your arms. Tell your care team right away if you have any change in your eyesight. Talk to your care team if you may be pregnant. Serious birth defects can occur if you take this medication during pregnancy and for 3 months after the last dose. You will need a negative pregnancy test before starting this medication.  Contraception is recommended while taking this medication and for 3 months after the last dose. Your care team can help you find the option that works for you. Do not breastfeed while taking this medication and for 3 months after the last dose. What side effects may I notice from receiving this medication? Side effects that you should report to your care team as soon as possible: Allergic reactions--skin rash, itching, hives, swelling of the face, lips, tongue, or throat Dry cough, shortness of breath or trouble breathing Eye pain, redness, irritation, or discharge with blurry or decreased vision Heart muscle inflammation--unusual weakness or fatigue, shortness of breath, chest pain, fast or irregular heartbeat, dizziness, swelling of the ankles, feet, or hands Hormone gland problems--headache, sensitivity to light, unusual weakness or fatigue, dizziness, fast or irregular heartbeat, increased sensitivity to cold or heat, excessive sweating, constipation, hair loss, increased thirst or amount of urine, tremors or shaking, irritability Infusion reactions--chest pain, shortness of breath or trouble breathing, feeling faint or lightheaded Kidney injury (glomerulonephritis)--decrease in the amount of urine, red or dark brown urine, foamy or bubbly urine, swelling of the ankles, hands, or feet Liver injury--right upper belly pain, loss of appetite, nausea, light-colored stool, dark yellow or brown urine, yellowing skin or eyes,  unusual weakness or fatigue Pain, tingling, or numbness in the hands or feet, muscle weakness, change in vision, confusion or trouble speaking, loss of balance or coordination, trouble walking, seizures Rash, fever, and swollen lymph nodes Redness, blistering, peeling, or loosening of the skin, including inside the mouth Sudden or severe stomach pain, bloody diarrhea, fever, nausea, vomiting Side effects that usually do not require medical attention (report these to your care team  if they continue or are bothersome): Bone, joint, or muscle pain Diarrhea Fatigue Loss of appetite Nausea Skin rash This list may not describe all possible side effects. Call your doctor for medical advice about side effects. You may report side effects to FDA at 1-800-FDA-1088. Where should I keep my medication? This medication is given in a hospital or clinic. It will not be stored at home. NOTE: This sheet is a summary. It may not cover all possible information. If you have questions about this medicine, talk to your doctor, pharmacist, or health care provider.  2024 Elsevier/Gold Standard (2022-03-03 00:00:00)

## 2024-03-24 NOTE — Progress Notes (Signed)
 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  INTERVAL HISTORY: Patient returns to clinic today for further evaluation and initiation of maintenance durvalumab.  She recently completed concurrent chemotherapy with weekly carboplatin  and Taxol  along with daily XRT and tolerated her treatment well.  She has significant back pain today, but otherwise feels well.  She has no neurologic complaints.  She denies any recent fevers or illnesses.  She has a good appetite and denies weight loss.  She has no chest pain, shortness of breath, cough, or hemoptysis.  She denies any nausea, vomiting, constipation, or diarrhea.  She has no urinary complaints.  Patient otherwise feels well and offers no further specific complaints today.  REVIEW OF SYSTEMS:   Review of Systems  Constitutional: Negative.  Negative for fever, malaise/fatigue and weight loss.  Respiratory:  Positive for shortness of breath. Negative for cough and hemoptysis.   Cardiovascular: Negative.  Negative for chest pain and leg swelling.  Gastrointestinal: Negative.  Negative for abdominal pain.  Genitourinary: Negative.  Negative for dysuria.  Musculoskeletal:  Positive for back pain and joint pain.  Skin: Negative.  Negative for rash.  Neurological: Negative.  Negative for dizziness, focal weakness, weakness and headaches.  Psychiatric/Behavioral: Negative.  The patient is not nervous/anxious.     As per HPI. Otherwise, a complete review of systems is negative.  PAST MEDICAL HISTORY: Past Medical  History:  Diagnosis Date   Acid reflux    COPD (chronic obstructive pulmonary disease) (HCC)    Diabetes mellitus without complication (HCC)    Hyperlipidemia    Hypertension    Thyroid  disease     PAST SURGICAL HISTORY: Past Surgical History:  Procedure Laterality Date   appendectomy     APPENDECTOMY     BREAST BIOPSY Right    CORE W/CLIP - NEG   ESOPHAGOGASTRODUODENOSCOPY N/A 10/28/2023   Procedure: ESOPHAGOGASTRODUODENOSCOPY (EGD);  Surgeon: Marnee Sink, MD;  Location: [facility] ENDOSCOPY;  Service: Endoscopy;  Laterality: N/A;   FLEXIBLE BRONCHOSCOPY Bilateral 11/12/2023   Procedure: FLEXIBLE BRONCHOSCOPY;  Surgeon: Erskin Hearing, MD;  Location: [facility] ORS;  Service: Thoracic;  Laterality: Bilateral;   IR EMBO ART  VEN HEMORR LYMPH EXTRAV  INC GUIDE ROADMAPPING  10/30/2023   IR IMAGING GUIDED PORT INSERTION  01/18/2024   IVC FILTER INSERTION N/A 11/09/2023   Procedure: IVC FILTER INSERTION;  Surgeon: Celso College, MD;  Location: [facility] INVASIVE CV LAB;  Service: Cardiovascular;  Laterality: N/A;   TOTAL VAGINAL HYSTERECTOMY     TUMOR REMOVAL     benign;stomach   VIDEO BRONCHOSCOPY WITH ENDOBRONCHIAL NAVIGATION Right 10/20/2023   Procedure: VIDEO BRONCHOSCOPY WITH ENDOBRONCHIAL NAVIGATION;  Surgeon: Erskin Hearing, MD;  Location: [facility] ORS;  Service: Thoracic;  Laterality: Right;   VIDEO BRONCHOSCOPY WITH ENDOBRONCHIAL NAVIGATION Right 11/12/2023   Procedure: VIDEO BRONCHOSCOPY WITH ENDOBRONCHIAL NAVIGATION;  Surgeon: Erskin Hearing, MD;  Location: [facility] ORS;  Service: Thoracic;  Laterality: Right;    FAMILY HISTORY: Family History  Problem Relation Age of Onset   Heart attack Mother    Hypertension Mother    Breast cancer Sister 35   Breast cancer Maternal Aunt  40'S   Breast cancer Maternal Grandmother     ADVANCED DIRECTIVES (Y/N):  N  HEALTH MAINTENANCE: Social History   Tobacco Use   Smoking status: Former    Current packs/day: 0.00    Types: Cigarettes     Quit date: 12/15/1985    Years since quitting: 38.2   Smokeless tobacco: Never  Substance Use Topics   Alcohol use: No   Drug use: No     Colonoscopy:  PAP:  Bone density:  Lipid panel:  Allergies  Allergen Reactions   Penicillins Hives   Aspirin  Other (See Comments)    Lips numb   Sulfa Antibiotics     Current Outpatient Medications  Medication Sig Dispense Refill   acetaminophen  (TYLENOL ) 325 MG tablet Take 650 mg by mouth every 6 (six) hours as needed.     aspirin  EC 81 MG tablet Take 81 mg by mouth daily.     atorvastatin  (LIPITOR) 20 MG tablet Take 20 mg by mouth daily.     calcium  carbonate (TUMS - DOSED IN MG ELEMENTAL CALCIUM ) 500 MG chewable tablet Chew 1 tablet by mouth daily.     cetirizine (ZYRTEC) 10 MG tablet Take 10 mg by mouth daily.     COMBIVENT  RESPIMAT 20-100 MCG/ACT AERS respimat Inhale into the lungs every 6 (six) hours as needed for wheezing or shortness of breath.     diclofenac sodium (VOLTAREN) 1 % GEL Apply topically 4 (four) times daily.     docusate sodium  (COLACE) 100 MG capsule Take 1 capsule (100 mg total) by mouth 2 (two) times daily.     DULERA 200-5 MCG/ACT AERO Inhale 2 puffs into the lungs every 12 (twelve) hours.     furosemide  (LASIX ) 20 MG tablet Take 1 tablet (20 mg total) by mouth daily. 30 tablet 0   glipiZIDE  (GLUCOTROL ) 10 MG tablet Take 10 mg by mouth daily before breakfast.     insulin  aspart (NOVOLOG ) 100 UNIT/ML injection Inject 4 Units into the skin 3 (three) times daily with meals.     insulin  glargine-yfgn (SEMGLEE ) 100 UNIT/ML injection Inject 0.18 mLs (18 Units total) into the skin daily.     ipratropium (ATROVENT HFA) 17 MCG/ACT inhaler Inhale 2 puffs into the lungs every 6 (six) hours.     levETIRAcetam  (KEPPRA ) 500 MG tablet Take 1 tablet (500 mg total) by mouth 2 (two) times daily.     levothyroxine  (SYNTHROID ) 150 MCG tablet Take 1 tablet (150 mcg total) by mouth daily before breakfast. Increased from 100.     [Paused]  lisinopril -hydrochlorothiazide  (PRINZIDE ,ZESTORETIC ) 20-25 MG per tablet Take 1 tablet by mouth daily.     LORazepam  (ATIVAN ) 0.5 MG tablet Take 0.5 mg by mouth every 8 (eight) hours as needed for anxiety.     Multiple Vitamin (MULTIVITAMIN) capsule Take 1 capsule by mouth daily.     pantoprazole  (PROTONIX ) 40 MG tablet Take 1 tablet (40 mg total) by mouth 2 (two) times daily before a meal.     pregabalin  (LYRICA ) 50 MG capsule Take 50 mg by mouth 2 (two) times daily.     rOPINIRole  (REQUIP  XL) 4 MG 24 hr tablet Take 1 tablet (4 mg total) by mouth at bedtime.     senna (SENOKOT) 8.6 MG TABS tablet Take 1 tablet (8.6 mg total) by mouth in the morning and at bedtime. 120 tablet 3   sertraline  (ZOLOFT ) 100 MG tablet Take 100 mg by mouth daily.     TRADJENTA  5 MG TABS tablet  Take 5 mg by mouth daily.     loperamide (IMODIUM A-D) 2 MG tablet Take 2 mg by mouth 4 (four) times daily as needed for diarrhea or loose stools. (Patient not taking: Reported on 03/24/2024)     naloxone [facility]) nasal spray 4 mg/0.1 mL Place 1 spray into the nose. (Patient not taking: Reported on 02/18/2024)     ondansetron  (ZOFRAN ) 4 MG/5ML solution Take by mouth once. (Patient not taking: Reported on 02/18/2024)     polyethylene glycol (MIRALAX  / GLYCOLAX ) 17 g packet Take 17 g by mouth daily. (Patient not taking: Reported on 02/18/2024)     No current facility-administered medications for this visit.   Facility-Administered Medications Ordered in Other Visits  Medication Dose Route Frequency Provider Last Rate Last Admin   0.9 %  sodium chloride  infusion   Intravenous Continuous Reinette Cuneo J, MD 10 mL/hr at 03/24/24 0945 New Bag at 03/24/24 0945   oxyCODONE  (Oxy IR/ROXICODONE ) immediate release tablet 5 mg  5 mg Oral Once Agrawal, Kavita, MD        OBJECTIVE: Vitals:   03/24/24 0835  BP: (!) 142/70  Pulse: (!) 109  Resp: 16  Temp: (!) 97.5 F (36.4 C)  SpO2: 96%     Body mass index is 28.29 kg/m.    ECOG FS:1  - Symptomatic but completely ambulatory  General: Well-developed, well-nourished, no acute distress. Eyes: Pink conjunctiva, anicteric sclera. HEENT: Normocephalic, moist mucous membranes. Lungs: No audible wheezing or coughing. Heart: Regular rate and rhythm. Abdomen: Soft, nontender, no obvious distention. Musculoskeletal: No edema, cyanosis, or clubbing. Neuro: Alert, answering all questions appropriately. Cranial nerves grossly intact. Skin: No rashes or petechiae noted. Psych: Normal affect. Lymphatics: No cervical, calvicular, axillary or inguinal LAD.   LAB RESULTS:  Lab Results  Component Value Date   NA 140 03/24/2024   K 3.4 (L) 03/24/2024   CL 101 03/24/2024   CO2 29 03/24/2024   GLUCOSE 232 (H) 03/24/2024   BUN 19 03/24/2024   CREATININE 0.77 03/24/2024   CALCIUM  8.8 (L) 03/24/2024   PROT 5.8 (L) 03/24/2024   ALBUMIN 3.4 (L) 03/24/2024   AST 18 03/24/2024   ALT 14 03/24/2024   ALKPHOS 91 03/24/2024   BILITOT 0.5 03/24/2024   GFRNONAA >60 03/24/2024   GFRAA >60 08/19/2017    Lab Results  Component Value Date   WBC 3.5 (L) 03/24/2024   NEUTROABS 2.6 03/24/2024   HGB 10.2 (L) 03/24/2024   HCT 31.1 (L) 03/24/2024   MCV 81.8 03/24/2024   PLT 138 (L) 03/24/2024     STUDIES: CT Chest W Contrast Result Date: 03/23/2024 CLINICAL DATA:  Non-small cell lung cancer (NSCLC), metastatic, assess treatment response. * Tracking Code: BO * EXAM: CT CHEST WITH CONTRAST TECHNIQUE: Multidetector CT imaging of the chest was performed during intravenous contrast administration. RADIATION DOSE REDUCTION: This exam was performed according to the departmental dose-optimization program which includes automated exposure control, adjustment of the mA and/or kV according to patient size and/or use of iterative reconstruction technique. CONTRAST:  75mL OMNIPAQUE  IOHEXOL  300 MG/ML  SOLN COMPARISON:  Nuclear medicine PET scan from 12/24/2023 and CT scan chest from 11/11/2023. FINDINGS:  Cardiovascular: Normal cardiac size. No pericardial effusion. No aortic aneurysm. There are coronary artery calcifications, in keeping with coronary artery disease. There are also mild-to-moderate peripheral atherosclerotic vascular calcifications of thoracic aorta and its major branches. There is satisfactory opacification of bilateral pulmonary arteries. No embolism seen up to the proximal subsegmental pulmonary artery level. Mediastinum/Nodes:  Visualized thyroid  gland appears grossly unremarkable. No solid / cystic mediastinal masses. The esophagus is nondistended precluding optimal assessment. There is a heterogeneous hyperattenuating 6 x 8 mm right hilar on lymph node (series 2, image 52), which does not meet the size criteria but was FDG avid on the prior PET-CT scan from 12/24/2023 and favored metastatic. No significant interval change in the size. No other axillary, mediastinal or hilar lymphadenopathy by size criteria. Lungs/Pleura: The central tracheo-bronchial tree is patent. Redemonstration of multiple pre-existing lung nodules, described below. Please note comparison is made with PET-CT scan from 12/24/2023. *Largest spiculated nodule in the right upper lobe measures 1.4 x 1.5 cm, essentially similar to the prior PET-CT scan. This was FDG avid. *Adjacent to it near the right hilum is an elongated 7 x 10 mm nodule, essentially similar to the prior PET-CT scan. This was FDG avid as well. *Spiculated and irregular 5 x 8 mm nodule in the right upper lobe, superiorly, essentially similar to the prior PET-CT scan. This was non FDG avid. *Ground-glass approximately 7 x 12 mm sized nodule in the middle lobe, essentially similar to the prior PET-CT scan. This was non FDG avid as well. *There is a stable sub 5 mm noncalcified opacity in the right major fissure, superiorly (series 2006, image 35), likely intra fissural lymph node. This was non FDG avid as well. No new or suspicious lung nodule. There are patchy  areas of linear, plate-like atelectasis and/or scarring throughout bilateral lungs. Mild upper lobe predominant centrilobular emphysematous changes noted. There is a subpleural emphysematous bleb in the right lung base. No mass or consolidation. No pleural effusion or pneumothorax. Upper Abdomen: Very small volume dependent calcified gallstones noted without imaging signs of acute cholecystitis. There is mild diffuse hepatic steatosis. There multiple simple cysts throughout bilateral kidneys with largest partially exophytic cyst arising from the right kidney upper pole measuring 1.6 x 1.9 cm. Embolization coils noted along the pancreatic head, likely from prior GDA embolization. Remaining visualized upper abdominal viscera within normal limits. Musculoskeletal: A CT Port-a-Cath is seen in the left upper chest wall with the catheter terminating in the cavo-atrial junction region. Visualized soft tissues of the chest wall are otherwise grossly unremarkable. No suspicious osseous lesions. There are mild multilevel degenerative changes in the visualized spine. IMPRESSION: 1. Essentially stable exam. 2. There are multiple pre-existing lung nodules, as described above. No new or suspicious lung nodule. 3. There is a subcentimeter right hilar lymph node, which is not enlarged by size criteria but was FDG avid on the prior PET-CT scan and favored metastatic. No other lymphadenopathy by size criteria. 4. Multiple other nonacute observations, as described above. Aortic Atherosclerosis (ICD10-I70.0) and Emphysema (ICD10-J43.9). Electronically Signed   By: Beula Brunswick M.D.   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 Tempus liquid biopsy did not reveal any actionable mutations and tumor mutational burden was low.  PET scan results from  December 24, 2023 confirmed stage of disease.  MRI of the brain on January 12, 2024 did not reveal any metastatic disease.  She completed her concurrent weekly chemotherapy with carboplatin  and Taxol  along with her daily XRT on approximately Mar 10, 2024.  CT scan of the chest on Mar 22, 2024 revealed stable disease.  Proceed with cycle 1 of monthly maintenance  durvalumab today.  Return to clinic in 4 weeks for further evaluation and consideration of cycle 2. Shortness of breath: Chronic and unchanged.  Continue oxygen as needed.  Follow-up with pulmonary as scheduled. Pain: Appears to be positional.  Patient currently taking Lyrica  50 mg twice per day.   History of right lower extremity DVT: IVC filter placed on November 09, 2023. Anemia: Chronic and unchanged.  Patient has a history of GI bleed. EGD on October 28, 2023 noted bleeding duodenal ulcer treated with clip placement.  Patient also had embolization with IR on October 30, 2023.  Her hemoglobin is 10.2 today.  She recently received 5 infusions of 200 mg IV Venofer  most recently on Mar 10, 2024. Leukopenia: Improved. Thrombocytopenia: Mild, monitor.  Patient's platelet count is 138. Hypokalemia: Mild, monitor.  Patient expressed understanding and was in agreement with this plan. She also understands that She can call clinic at any time with any questions, concerns, or complaints.    Cancer Staging  Cancer of upper lobe of right lung [facility]) Staging form: Lung, AJCC V9 - Clinical stage from 11/12/2023: Stage IIIA (cT3, cN1, cM0) - Signed by Agrawal, Kavita, MD on 01/07/2024   Shellie Dials, MD   03/24/2024 12:52 PM

## 2024-03-26 ENCOUNTER — Other Ambulatory Visit: Payer: Self-pay

## 2024-03-26 LAB — T4: T4, Total: 9.1 ug/dL (ref 4.5–12.0)

## 2024-03-30 ENCOUNTER — Encounter: Payer: Self-pay | Admitting: Oncology

## 2024-04-03 ENCOUNTER — Ambulatory Visit

## 2024-04-05 ENCOUNTER — Other Ambulatory Visit: Payer: Self-pay

## 2024-04-18 ENCOUNTER — Other Ambulatory Visit: Payer: Self-pay

## 2024-04-19 ENCOUNTER — Inpatient Hospital Stay

## 2024-04-19 ENCOUNTER — Inpatient Hospital Stay: Admitting: Oncology

## 2024-04-19 ENCOUNTER — Inpatient Hospital Stay: Attending: Internal Medicine

## 2024-04-19 ENCOUNTER — Ambulatory Visit: Attending: Radiation Oncology | Admitting: Radiation Oncology

## 2024-04-19 DIAGNOSIS — I110 Hypertensive heart disease with heart failure: Secondary | ICD-10-CM | POA: Insufficient documentation

## 2024-04-19 DIAGNOSIS — Z791 Long term (current) use of non-steroidal anti-inflammatories (NSAID): Secondary | ICD-10-CM | POA: Insufficient documentation

## 2024-04-19 DIAGNOSIS — E785 Hyperlipidemia, unspecified: Secondary | ICD-10-CM | POA: Insufficient documentation

## 2024-04-19 DIAGNOSIS — K264 Chronic or unspecified duodenal ulcer with hemorrhage: Secondary | ICD-10-CM | POA: Insufficient documentation

## 2024-04-19 DIAGNOSIS — I2510 Atherosclerotic heart disease of native coronary artery without angina pectoris: Secondary | ICD-10-CM | POA: Insufficient documentation

## 2024-04-19 DIAGNOSIS — R531 Weakness: Secondary | ICD-10-CM | POA: Insufficient documentation

## 2024-04-19 DIAGNOSIS — Z79890 Hormone replacement therapy (postmenopausal): Secondary | ICD-10-CM | POA: Insufficient documentation

## 2024-04-19 DIAGNOSIS — Z7951 Long term (current) use of inhaled steroids: Secondary | ICD-10-CM | POA: Insufficient documentation

## 2024-04-19 DIAGNOSIS — I69351 Hemiplegia and hemiparesis following cerebral infarction affecting right dominant side: Secondary | ICD-10-CM | POA: Insufficient documentation

## 2024-04-19 DIAGNOSIS — Z7952 Long term (current) use of systemic steroids: Secondary | ICD-10-CM | POA: Insufficient documentation

## 2024-04-19 DIAGNOSIS — R200 Anesthesia of skin: Secondary | ICD-10-CM | POA: Insufficient documentation

## 2024-04-19 DIAGNOSIS — Z9049 Acquired absence of other specified parts of digestive tract: Secondary | ICD-10-CM | POA: Insufficient documentation

## 2024-04-19 DIAGNOSIS — K219 Gastro-esophageal reflux disease without esophagitis: Secondary | ICD-10-CM | POA: Insufficient documentation

## 2024-04-19 DIAGNOSIS — C3411 Malignant neoplasm of upper lobe, right bronchus or lung: Secondary | ICD-10-CM | POA: Insufficient documentation

## 2024-04-19 DIAGNOSIS — J449 Chronic obstructive pulmonary disease, unspecified: Secondary | ICD-10-CM | POA: Insufficient documentation

## 2024-04-19 DIAGNOSIS — Z794 Long term (current) use of insulin: Secondary | ICD-10-CM | POA: Insufficient documentation

## 2024-04-19 DIAGNOSIS — Z923 Personal history of irradiation: Secondary | ICD-10-CM | POA: Insufficient documentation

## 2024-04-19 DIAGNOSIS — I700 Atherosclerosis of aorta: Secondary | ICD-10-CM | POA: Insufficient documentation

## 2024-04-19 DIAGNOSIS — J441 Chronic obstructive pulmonary disease with (acute) exacerbation: Secondary | ICD-10-CM | POA: Insufficient documentation

## 2024-04-19 DIAGNOSIS — D696 Thrombocytopenia, unspecified: Secondary | ICD-10-CM | POA: Insufficient documentation

## 2024-04-19 DIAGNOSIS — R911 Solitary pulmonary nodule: Secondary | ICD-10-CM | POA: Insufficient documentation

## 2024-04-19 DIAGNOSIS — E876 Hypokalemia: Secondary | ICD-10-CM | POA: Insufficient documentation

## 2024-04-19 DIAGNOSIS — Z510 Encounter for antineoplastic radiation therapy: Secondary | ICD-10-CM | POA: Insufficient documentation

## 2024-04-19 DIAGNOSIS — Z7984 Long term (current) use of oral hypoglycemic drugs: Secondary | ICD-10-CM | POA: Insufficient documentation

## 2024-04-19 DIAGNOSIS — I509 Heart failure, unspecified: Secondary | ICD-10-CM | POA: Insufficient documentation

## 2024-04-19 DIAGNOSIS — Z5112 Encounter for antineoplastic immunotherapy: Secondary | ICD-10-CM | POA: Insufficient documentation

## 2024-04-19 DIAGNOSIS — G9340 Encephalopathy, unspecified: Secondary | ICD-10-CM | POA: Insufficient documentation

## 2024-04-19 DIAGNOSIS — K5900 Constipation, unspecified: Secondary | ICD-10-CM | POA: Insufficient documentation

## 2024-04-19 DIAGNOSIS — Z9981 Dependence on supplemental oxygen: Secondary | ICD-10-CM | POA: Insufficient documentation

## 2024-04-19 DIAGNOSIS — E119 Type 2 diabetes mellitus without complications: Secondary | ICD-10-CM | POA: Insufficient documentation

## 2024-04-19 DIAGNOSIS — I10 Essential (primary) hypertension: Secondary | ICD-10-CM | POA: Insufficient documentation

## 2024-04-19 DIAGNOSIS — R197 Diarrhea, unspecified: Secondary | ICD-10-CM | POA: Insufficient documentation

## 2024-04-19 DIAGNOSIS — E1140 Type 2 diabetes mellitus with diabetic neuropathy, unspecified: Secondary | ICD-10-CM | POA: Insufficient documentation

## 2024-04-19 DIAGNOSIS — D72819 Decreased white blood cell count, unspecified: Secondary | ICD-10-CM | POA: Insufficient documentation

## 2024-04-19 DIAGNOSIS — Z79899 Other long term (current) drug therapy: Secondary | ICD-10-CM | POA: Insufficient documentation

## 2024-04-19 DIAGNOSIS — Z803 Family history of malignant neoplasm of breast: Secondary | ICD-10-CM | POA: Insufficient documentation

## 2024-04-19 DIAGNOSIS — Z5111 Encounter for antineoplastic chemotherapy: Secondary | ICD-10-CM | POA: Insufficient documentation

## 2024-04-19 DIAGNOSIS — D509 Iron deficiency anemia, unspecified: Secondary | ICD-10-CM | POA: Insufficient documentation

## 2024-04-19 DIAGNOSIS — Z86718 Personal history of other venous thrombosis and embolism: Secondary | ICD-10-CM | POA: Insufficient documentation

## 2024-04-19 DIAGNOSIS — Z8719 Personal history of other diseases of the digestive system: Secondary | ICD-10-CM | POA: Insufficient documentation

## 2024-04-19 DIAGNOSIS — Z87891 Personal history of nicotine dependence: Secondary | ICD-10-CM | POA: Insufficient documentation

## 2024-04-19 DIAGNOSIS — Z7982 Long term (current) use of aspirin: Secondary | ICD-10-CM | POA: Insufficient documentation

## 2024-04-20 ENCOUNTER — Encounter: Payer: Self-pay | Admitting: Oncology

## 2024-04-21 ENCOUNTER — Ambulatory Visit

## 2024-04-21 ENCOUNTER — Other Ambulatory Visit

## 2024-04-21 ENCOUNTER — Ambulatory Visit: Admitting: Oncology

## 2024-04-24 ENCOUNTER — Other Ambulatory Visit: Payer: Self-pay

## 2024-04-26 ENCOUNTER — Other Ambulatory Visit: Payer: Self-pay

## 2024-04-27 ENCOUNTER — Other Ambulatory Visit: Payer: Self-pay | Admitting: Oncology

## 2024-04-27 ENCOUNTER — Encounter: Payer: Self-pay | Admitting: Oncology

## 2024-04-27 DIAGNOSIS — C3411 Malignant neoplasm of upper lobe, right bronchus or lung: Secondary | ICD-10-CM

## 2024-05-04 ENCOUNTER — Other Ambulatory Visit: Payer: Self-pay

## 2024-05-16 ENCOUNTER — Other Ambulatory Visit: Payer: Self-pay | Admitting: Family Medicine

## 2024-05-16 ENCOUNTER — Other Ambulatory Visit: Payer: Self-pay

## 2024-05-16 DIAGNOSIS — J9620 Acute and chronic respiratory failure, unspecified whether with hypoxia or hypercapnia: Secondary | ICD-10-CM

## 2024-05-19 ENCOUNTER — Inpatient Hospital Stay (HOSPITAL_BASED_OUTPATIENT_CLINIC_OR_DEPARTMENT_OTHER): Admitting: Oncology

## 2024-05-19 ENCOUNTER — Inpatient Hospital Stay: Attending: Internal Medicine

## 2024-05-19 ENCOUNTER — Inpatient Hospital Stay

## 2024-05-19 ENCOUNTER — Encounter: Payer: Self-pay | Admitting: Oncology

## 2024-05-19 ENCOUNTER — Ambulatory Visit
Admission: RE | Admit: 2024-05-19 | Discharge: 2024-05-19 | Disposition: A | Discharge status: Home / Self Care | Source: Ambulatory Visit | Attending: Oncology | Admitting: Oncology

## 2024-05-19 VITALS — BP 121/80 | HR 103 | Temp 97.0°F | Resp 17 | Wt 147.9 lb

## 2024-05-19 VITALS — BP 123/76 | HR 98 | Resp 18

## 2024-05-19 DIAGNOSIS — Z79890 Hormone replacement therapy (postmenopausal): Secondary | ICD-10-CM | POA: Insufficient documentation

## 2024-05-19 DIAGNOSIS — K219 Gastro-esophageal reflux disease without esophagitis: Secondary | ICD-10-CM | POA: Diagnosis not present

## 2024-05-19 DIAGNOSIS — Z9049 Acquired absence of other specified parts of digestive tract: Secondary | ICD-10-CM | POA: Insufficient documentation

## 2024-05-19 DIAGNOSIS — M25561 Pain in right knee: Secondary | ICD-10-CM

## 2024-05-19 DIAGNOSIS — E119 Type 2 diabetes mellitus without complications: Secondary | ICD-10-CM | POA: Insufficient documentation

## 2024-05-19 DIAGNOSIS — C3411 Malignant neoplasm of upper lobe, right bronchus or lung: Secondary | ICD-10-CM | POA: Insufficient documentation

## 2024-05-19 DIAGNOSIS — E079 Disorder of thyroid, unspecified: Secondary | ICD-10-CM | POA: Insufficient documentation

## 2024-05-19 DIAGNOSIS — Z87891 Personal history of nicotine dependence: Secondary | ICD-10-CM | POA: Insufficient documentation

## 2024-05-19 DIAGNOSIS — Z803 Family history of malignant neoplasm of breast: Secondary | ICD-10-CM | POA: Diagnosis not present

## 2024-05-19 DIAGNOSIS — Z791 Long term (current) use of non-steroidal anti-inflammatories (NSAID): Secondary | ICD-10-CM | POA: Insufficient documentation

## 2024-05-19 DIAGNOSIS — K449 Diaphragmatic hernia without obstruction or gangrene: Secondary | ICD-10-CM | POA: Diagnosis not present

## 2024-05-19 DIAGNOSIS — I10 Essential (primary) hypertension: Secondary | ICD-10-CM | POA: Diagnosis not present

## 2024-05-19 DIAGNOSIS — Z79899 Other long term (current) drug therapy: Secondary | ICD-10-CM | POA: Insufficient documentation

## 2024-05-19 DIAGNOSIS — Z7951 Long term (current) use of inhaled steroids: Secondary | ICD-10-CM | POA: Insufficient documentation

## 2024-05-19 DIAGNOSIS — Z7982 Long term (current) use of aspirin: Secondary | ICD-10-CM | POA: Insufficient documentation

## 2024-05-19 DIAGNOSIS — Z86718 Personal history of other venous thrombosis and embolism: Secondary | ICD-10-CM | POA: Insufficient documentation

## 2024-05-19 DIAGNOSIS — Z7984 Long term (current) use of oral hypoglycemic drugs: Secondary | ICD-10-CM | POA: Diagnosis not present

## 2024-05-19 DIAGNOSIS — Z5112 Encounter for antineoplastic immunotherapy: Secondary | ICD-10-CM | POA: Insufficient documentation

## 2024-05-19 DIAGNOSIS — Z794 Long term (current) use of insulin: Secondary | ICD-10-CM | POA: Diagnosis not present

## 2024-05-19 DIAGNOSIS — K264 Chronic or unspecified duodenal ulcer with hemorrhage: Secondary | ICD-10-CM | POA: Diagnosis not present

## 2024-05-19 DIAGNOSIS — E785 Hyperlipidemia, unspecified: Secondary | ICD-10-CM | POA: Diagnosis not present

## 2024-05-19 DIAGNOSIS — D649 Anemia, unspecified: Secondary | ICD-10-CM | POA: Insufficient documentation

## 2024-05-19 LAB — CBC WITH DIFFERENTIAL (CANCER CENTER ONLY)
Abs Immature Granulocytes: 0.03 K/uL (ref 0.00–0.07)
Basophils Absolute: 0 K/uL (ref 0.0–0.1)
Basophils Relative: 0 %
Eosinophils Absolute: 0 K/uL (ref 0.0–0.5)
Eosinophils Relative: 0 %
HCT: 34.6 % — ABNORMAL LOW (ref 36.0–46.0)
Hemoglobin: 10.5 g/dL — ABNORMAL LOW (ref 12.0–15.0)
Immature Granulocytes: 1 %
Lymphocytes Relative: 7 %
Lymphs Abs: 0.4 K/uL — ABNORMAL LOW (ref 0.7–4.0)
MCH: 26.1 pg (ref 26.0–34.0)
MCHC: 30.3 g/dL (ref 30.0–36.0)
MCV: 86.1 fL (ref 80.0–100.0)
Monocytes Absolute: 0.6 K/uL (ref 0.1–1.0)
Monocytes Relative: 10 %
Neutro Abs: 4.5 K/uL (ref 1.7–7.7)
Neutrophils Relative %: 82 %
Platelet Count: 200 K/uL (ref 150–400)
RBC: 4.02 MIL/uL (ref 3.87–5.11)
RDW: 15.5 % (ref 11.5–15.5)
WBC Count: 5.4 K/uL (ref 4.0–10.5)
nRBC: 0 % (ref 0.0–0.2)

## 2024-05-19 LAB — CMP (CANCER CENTER ONLY)
ALT: 15 U/L (ref 0–44)
AST: 20 U/L (ref 15–41)
Albumin: 3.4 g/dL — ABNORMAL LOW (ref 3.5–5.0)
Alkaline Phosphatase: 105 U/L (ref 38–126)
Anion gap: 9 (ref 5–15)
BUN: 16 mg/dL (ref 8–23)
CO2: 34 mmol/L — ABNORMAL HIGH (ref 22–32)
Calcium: 10 mg/dL (ref 8.9–10.3)
Chloride: 96 mmol/L — ABNORMAL LOW (ref 98–111)
Creatinine: 0.56 mg/dL (ref 0.44–1.00)
GFR, Estimated: >60 mL/min (ref >60)
Glucose, Bld: 146 mg/dL — ABNORMAL HIGH (ref 70–99)
Potassium: 3.6 mmol/L (ref 3.5–5.1)
Sodium: 139 mmol/L (ref 135–145)
Total Bilirubin: 0.3 mg/dL (ref 0.0–1.2)
Total Protein: 6.5 g/dL (ref 6.5–8.1)

## 2024-05-19 MED ORDER — SODIUM CHLORIDE 0.9 % IV SOLN
1500.0000 mg | Freq: Once | INTRAVENOUS | Status: AC
  Administered 2024-05-19: 1500 mg via INTRAVENOUS
  Filled 2024-05-19: qty 30

## 2024-05-19 MED ORDER — SODIUM CHLORIDE 0.9 % IV SOLN
INTRAVENOUS | Status: DC
  Filled 2024-05-19: qty 250

## 2024-05-19 MED ORDER — HEPARIN SOD (PORK) LOCK FLUSH 100 UNIT/ML IV SOLN
500.0000 [IU] | Freq: Once | INTRAVENOUS | Status: DC | PRN
Start: 2024-05-19 — End: 2024-05-19
  Filled 2024-05-19: qty 5

## 2024-05-19 NOTE — Progress Notes (Signed)
 Patient here for oncology follow-up appointment, concerns  of complains of right knee pain

## 2024-05-19 NOTE — Progress Notes (Signed)
 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  INTERVAL HISTORY: Patient returns to clinic today for further evaluation and consideration of cycle 2 of maintenance durvalumab .  She has multiple medical complaints that are chronic and unchanged.  She complains of increased right knee pain today.  She has no neurologic complaints.  She denies any recent fevers or illnesses.  She has a good appetite and denies weight loss.  She has chronic shortness of breath, but denies any cough, chest pain, or hemoptysis.  She denies any nausea, vomiting, constipation, or diarrhea.  She has no urinary complaints.  Patient offers no further specific complaints today.  REVIEW OF SYSTEMS:   Review of Systems  Constitutional: Negative.  Negative for fever, malaise/fatigue and weight loss.  Respiratory:  Positive for shortness of breath. Negative for cough and hemoptysis.   Cardiovascular: Negative.  Negative for chest pain and leg swelling.  Gastrointestinal: Negative.  Negative for abdominal pain.  Genitourinary: Negative.  Negative for dysuria.  Musculoskeletal:  Positive for back pain and joint pain.  Skin: Negative.  Negative for rash.  Neurological: Negative.  Negative for dizziness, focal weakness, weakness and headaches.  Psychiatric/Behavioral: Negative.  The patient is not nervous/anxious.     As per HPI. Otherwise, a complete review of systems is negative.  PAST MEDICAL HISTORY: Past Medical History:  Diagnosis Date   Acid reflux    COPD (chronic obstructive  pulmonary disease) (HCC)    Diabetes mellitus without complication (HCC)    Hyperlipidemia    Hypertension    Thyroid  disease     PAST SURGICAL HISTORY: Past Surgical History:  Procedure Laterality Date   appendectomy     APPENDECTOMY     BREAST BIOPSY Right    CORE W/CLIP - NEG   ESOPHAGOGASTRODUODENOSCOPY N/A 10/28/2023   Procedure: ESOPHAGOGASTRODUODENOSCOPY (EGD);  Surgeon: Jinny Carmine, MD;  Location[facility] ENDOSCOPY;  Service: Endoscopy;  Laterality: N/A;   FLEXIBLE BRONCHOSCOPY Bilateral 11/12/2023   Procedure: FLEXIBLE BRONCHOSCOPY;  Surgeon: Parris Manna, MD;  Location: [facility] ORS;  Service: Thoracic;  Laterality: Bilateral;   IR EMBO ART  VEN HEMORR LYMPH EXTRAV  INC GUIDE ROADMAPPING  10/30/2023   IR IMAGING GUIDED PORT INSERTION  01/18/2024   IVC FILTER INSERTION N/A 11/09/2023   Procedure: IVC FILTER INSERTION;  Surgeon: Marea Selinda RAMAN, MD;  Location: [facility] INVASIVE CV LAB;  Service: Cardiovascular;  Laterality: N/A;   TOTAL VAGINAL HYSTERECTOMY     TUMOR REMOVAL     benign;stomach   VIDEO BRONCHOSCOPY WITH ENDOBRONCHIAL NAVIGATION Right 10/20/2023   Procedure: VIDEO BRONCHOSCOPY WITH ENDOBRONCHIAL NAVIGATION;  Surgeon: Parris Manna, MD;  Location: [facility] ORS;  Service: Thoracic;  Laterality: Right;   VIDEO BRONCHOSCOPY WITH ENDOBRONCHIAL NAVIGATION Right 11/12/2023   Procedure: VIDEO BRONCHOSCOPY WITH ENDOBRONCHIAL NAVIGATION;  Surgeon: Parris Manna, MD;  Location: [facility] ORS;  Service: Thoracic;  Laterality: Right;    FAMILY HISTORY: Family History  Problem Relation Age of Onset   Heart attack Mother    Hypertension Mother    Breast cancer Sister 12   Breast cancer Maternal Aunt        40'S   Breast  cancer Maternal Grandmother     ADVANCED DIRECTIVES (Y/N):  N  HEALTH MAINTENANCE: Social History   Tobacco Use   Smoking status: Former    Current packs/day: 0.00    Types: Cigarettes    Quit date: 12/15/1985    Years since quitting: 38.4   Smokeless  tobacco: Never  Substance Use Topics   Alcohol use: No   Drug use: No     Colonoscopy:  PAP:  Bone density:  Lipid panel:  Allergies  Allergen Reactions   Penicillins Hives   Aspirin  Other (See Comments)    Lips numb   Sulfa Antibiotics     Current Outpatient Medications  Medication Sig Dispense Refill   acetaminophen  (TYLENOL ) 325 MG tablet Take 650 mg by mouth every 6 (six) hours as needed.     aspirin  EC 81 MG tablet Take 81 mg by mouth daily.     atorvastatin  (LIPITOR) 20 MG tablet Take 20 mg by mouth daily.     busPIRone (BUSPAR) 15 MG tablet Take 15 mg by mouth 3 (three) times daily.     calcium  carbonate (TUMS - DOSED IN MG ELEMENTAL CALCIUM ) 500 MG chewable tablet Chew 1 tablet by mouth daily.     cetirizine (ZYRTEC) 10 MG tablet Take 10 mg by mouth daily.     COMBIVENT  RESPIMAT 20-100 MCG/ACT AERS respimat Inhale into the lungs every 6 (six) hours as needed for wheezing or shortness of breath.     diclofenac sodium (VOLTAREN) 1 % GEL Apply topically 4 (four) times daily.     docusate sodium  (COLACE) 100 MG capsule Take 1 capsule (100 mg total) by mouth 2 (two) times daily.     DULERA 200-5 MCG/ACT AERO Inhale 2 puffs into the lungs every 12 (twelve) hours.     furosemide  (LASIX ) 20 MG tablet Take 1 tablet (20 mg total) by mouth daily. 30 tablet 0   glipiZIDE  (GLUCOTROL ) 10 MG tablet Take 10 mg by mouth daily before breakfast.     hydrOXYzine  (ATARAX ) 25 MG tablet Take 25 mg by mouth 3 (three) times daily as needed.     insulin  aspart (NOVOLOG ) 100 UNIT/ML injection Inject 4 Units into the skin 3 (three) times daily with meals.     insulin  glargine-yfgn (SEMGLEE ) 100 UNIT/ML injection Inject 0.18 mLs (18 Units total) into the skin daily.     ipratropium (ATROVENT HFA) 17 MCG/ACT inhaler Inhale 2 puffs into the lungs every 6 (six) hours.     levETIRAcetam  (KEPPRA ) 500 MG tablet Take 1 tablet (500 mg total) by mouth 2 (two) times daily.     levothyroxine  (SYNTHROID ) 150  MCG tablet Take 1 tablet (150 mcg total) by mouth daily before breakfast. Increased from 100.     [Paused] lisinopril -hydrochlorothiazide  (PRINZIDE ,ZESTORETIC ) 20-25 MG per tablet Take 1 tablet by mouth daily.     loperamide (IMODIUM A-D) 2 MG tablet Take 2 mg by mouth 4 (four) times daily as needed for diarrhea or loose stools.     LORazepam  (ATIVAN ) 0.5 MG tablet Take 0.5 mg by mouth every 8 (eight) hours as needed for anxiety.     Multiple Vitamin (MULTIVITAMIN) capsule Take 1 capsule by mouth daily.     pantoprazole  (PROTONIX ) 40 MG tablet Take 1 tablet (40 mg total) by mouth 2 (two) times daily before a meal.     pregabalin  (LYRICA ) 50 MG capsule Take 50 mg by mouth 2 (two) times daily.     rOPINIRole  (REQUIP  XL) 4 MG 24 hr  tablet Take 1 tablet (4 mg total) by mouth at bedtime.     senna (SENOKOT) 8.6 MG TABS tablet Take 1 tablet (8.6 mg total) by mouth in the morning and at bedtime. 120 tablet 3   sertraline  (ZOLOFT ) 100 MG tablet Take 100 mg by mouth daily.     TRADJENTA  5 MG TABS tablet Take 5 mg by mouth daily.     naloxone (NARCAN) nasal spray 4 mg/0.1 mL Place 1 spray into the nose. (Patient not taking: Reported on 05/19/2024)     ondansetron  (ZOFRAN ) 4 MG/5ML solution Take by mouth once. (Patient not taking: Reported on 05/19/2024)     polyethylene glycol (MIRALAX  / GLYCOLAX ) 17 g packet Take 17 g by mouth daily. (Patient not taking: Reported on 05/19/2024)     No current facility-administered medications for this visit.   Facility-Administered Medications Ordered in Other Visits  Medication Dose Route Frequency Provider Last Rate Last Admin   0.9 %  sodium chloride  infusion   Intravenous Continuous Jacobo, Tricia [place] J, MD       durvalumab  (IMFINZI ) 1,500 mg in sodium chloride  0.9 % 100 mL chemo infusion  1,500 mg Intravenous Once Tonique Mendonca J, MD       heparin  lock flush 100 unit/mL  500 Units Intracatheter Once PRN Kyndle Schlender J, MD       oxyCODONE  (Oxy IR/ROXICODONE )  immediate release tablet 5 mg  5 mg Oral Once Agrawal, Kavita, MD        OBJECTIVE: Vitals:   05/19/24 0847  BP: 121/80  Pulse: (!) 103  Resp: 17  Temp: (!) 97 F (36.1 C)  SpO2: 97%     Body mass index is 27.95 kg/m.    ECOG FS:1 - Symptomatic but completely ambulatory  General: Well-developed, well-nourished, mild respiratory distress.  Sitting in wheelchair.   Eyes: Pink conjunctiva, anicteric sclera. HEENT: Normocephalic, moist mucous membranes. Lungs: No audible wheezing or coughing. Heart: Regular rate and rhythm. Abdomen: Soft, nontender, no obvious distention. Musculoskeletal: No edema, cyanosis, or clubbing. Neuro: Alert, answering all questions appropriately. Cranial nerves grossly intact. Skin: No rashes or petechiae noted. Psych: Normal affect.   LAB RESULTS:  Lab Results  Component Value Date   NA 139 05/19/2024   K 3.6 05/19/2024   CL 96 (L) 05/19/2024   CO2 34 (H) 05/19/2024   GLUCOSE 146 (H) 05/19/2024   BUN 16 05/19/2024   CREATININE 0.56 05/19/2024   CALCIUM  10.0 05/19/2024   PROT 6.5 05/19/2024   ALBUMIN 3.4 (L) 05/19/2024   AST 20 05/19/2024   ALT 15 05/19/2024   ALKPHOS 105 05/19/2024   BILITOT 0.3 05/19/2024   GFRNONAA >60 05/19/2024   GFRAA >60 08/19/2017    Lab Results  Component Value Date   WBC 5.4 05/19/2024   NEUTROABS 4.5 05/19/2024   HGB 10.5 (L) 05/19/2024   HCT 34.6 (L) 05/19/2024   MCV 86.1 05/19/2024   PLT 200 05/19/2024     STUDIES: No results found.   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 Tempus liquid biopsy did not reveal any actionable mutations and tumor mutational burden was low.  PET scan results from December 24, 2023 confirmed stage of disease.  MRI of the brain on January 12, 2024 did not reveal any metastatic disease.  She completed her  concurrent weekly chemotherapy with carboplatin   and Taxol  along with her daily XRT on approximately Mar 10, 2024.  CT scan of the chest on Mar 22, 2024 revealed stable disease.  Proceed with cycle 2 of monthly maintenance durvalumab  today.  Return to clinic in 4 weeks for further evaluation and consideration of cycle 3.  Patient has reimaging with CT scan scheduled on May 24, 2024.   Shortness of breath: Unchanged.  Continue oxygen as needed.  Follow-up with pulmonary as scheduled. Pain: Appears to be positional.  Patient currently taking Lyrica  50 mg twice per day.  Will get x-ray of patient's right knee for completeness today. History of right lower extremity DVT: IVC filter placed on November 09, 2023. Anemia: Chronic and unchanged.  Patient's hemoglobin is 10.5 today.  She has a history of GI bleed. EGD on October 28, 2023 noted bleeding duodenal ulcer treated with clip placement.  Patient also had embolization with IR on October 30, 2023.  She recently received 5 infusions of 200 mg IV Venofer  most recently on Mar 10, 2024. Leukopenia: Resolved. Thrombocytopenia: Resolved. Hypokalemia: Resolved.  Patient expressed understanding and was in agreement with this plan. She also understands that She can call clinic at any time with any questions, concerns, or complaints.    Cancer Staging  Cancer of upper lobe of right lung [facility]) Staging form: Lung, AJCC V9 - Clinical stage from 11/12/2023: Stage IIIA (cT3, cN1, cM0) - Signed by Agrawal, Kavita, MD on 01/07/2024   Evalene JINNY Reusing, MD   05/19/2024 9:29 AM

## 2024-05-19 NOTE — Patient Instructions (Signed)
 [facility] - A DEPT OF [facility]  Discharge Instructions: Thank you for choosing [facility] to provide your oncology and hematology care.  If you have a lab appointment with the Cancer Center, please go directly to the Cancer Center and check in at the registration area.  Wear comfortable clothing and clothing appropriate for easy access to any Portacath or PICC line.   We strive to give you quality time with your provider. You may need to reschedule your appointment if you arrive late (15 or more minutes).  Arriving late affects you and other patients whose appointments are after yours.  Also, if you miss three or more appointments without notifying the office, you may be dismissed from the clinic at the provider's discretion.      For prescription refill requests, have your pharmacy contact our office and allow 72 hours for refills to be completed.    Today you received the following chemotherapy and/or immunotherapy agents Imfinzi      To help prevent nausea and vomiting after your treatment, we encourage you to take your nausea medication as directed.  BELOW ARE SYMPTOMS THAT SHOULD BE REPORTED IMMEDIATELY: *FEVER GREATER THAN 100.4 F (38 C) OR HIGHER *CHILLS OR SWEATING *NAUSEA AND VOMITING THAT IS NOT CONTROLLED WITH YOUR NAUSEA MEDICATION *UNUSUAL SHORTNESS OF BREATH *UNUSUAL BRUISING OR BLEEDING *URINARY PROBLEMS (pain or burning when urinating, or frequent urination) *BOWEL PROBLEMS (unusual diarrhea, constipation, pain near the anus) TENDERNESS IN MOUTH AND THROAT WITH OR WITHOUT PRESENCE OF ULCERS (sore throat, sores in mouth, or a toothache) UNUSUAL RASH, SWELLING OR PAIN  UNUSUAL VAGINAL DISCHARGE OR ITCHING   Items with * indicate a potential emergency and should be followed up as soon as possible or go to the Emergency Department if any problems should occur.  Please show the CHEMOTHERAPY ALERT CARD or IMMUNOTHERAPY  ALERT CARD at check-in to the Emergency Department and triage nurse.  Should you have questions after your visit or need to cancel or reschedule your appointment, please contact [facility] - A DEPT OF [facility]  [phone] and follow the prompts.  Office hours are 8:00 a.m. to 4:30 p.m. Monday - Friday. Please note that voicemails left after 4:00 p.m. may not be returned until the following business day.  We are closed weekends and major holidays. You have access to a nurse at all times for urgent questions. Please call the main number to the clinic [phone] and follow the prompts.  For any non-urgent questions, you may also contact your provider using MyChart. We now offer e-Visits for anyone 1 and older to request care online for non-urgent symptoms. For details visit [email or web address].   Also download the MyChart app! Go to the app store, search "MyChart", open the app, select [place], and log in with your MyChart username and password.

## 2024-05-23 ENCOUNTER — Other Ambulatory Visit: Payer: Self-pay

## 2024-05-24 ENCOUNTER — Ambulatory Visit
Admission: RE | Admit: 2024-05-24 | Discharge: 2024-05-24 | Disposition: A | Discharge status: Home / Self Care | Source: Ambulatory Visit | Attending: Family Medicine | Admitting: Family Medicine

## 2024-05-24 DIAGNOSIS — J9620 Acute and chronic respiratory failure, unspecified whether with hypoxia or hypercapnia: Secondary | ICD-10-CM | POA: Diagnosis present

## 2024-05-24 MED ORDER — IOHEXOL 300 MG/ML  SOLN
75.0000 mL | Freq: Once | INTRAMUSCULAR | Status: AC | PRN
  Administered 2024-05-24: 75 mL via INTRAVENOUS

## 2024-06-16 ENCOUNTER — Inpatient Hospital Stay (HOSPITAL_BASED_OUTPATIENT_CLINIC_OR_DEPARTMENT_OTHER): Admitting: Nurse Practitioner

## 2024-06-16 ENCOUNTER — Inpatient Hospital Stay

## 2024-06-16 ENCOUNTER — Inpatient Hospital Stay: Attending: Internal Medicine

## 2024-06-16 ENCOUNTER — Encounter: Payer: Self-pay | Admitting: Nurse Practitioner

## 2024-06-16 VITALS — BP 115/70 | HR 99 | Temp 97.6°F | Resp 16 | Wt 148.2 lb

## 2024-06-16 VITALS — BP 117/76 | HR 102

## 2024-06-16 DIAGNOSIS — Z79620 Long term (current) use of immunosuppressive biologic: Secondary | ICD-10-CM | POA: Insufficient documentation

## 2024-06-16 DIAGNOSIS — C3411 Malignant neoplasm of upper lobe, right bronchus or lung: Secondary | ICD-10-CM

## 2024-06-16 DIAGNOSIS — Z5112 Encounter for antineoplastic immunotherapy: Secondary | ICD-10-CM | POA: Diagnosis not present

## 2024-06-16 LAB — CBC WITH DIFFERENTIAL (CANCER CENTER ONLY)
Abs Immature Granulocytes: 0.03 K/uL (ref 0.00–0.07)
Basophils Absolute: 0 K/uL (ref 0.0–0.1)
Basophils Relative: 0 %
Eosinophils Absolute: 0 K/uL (ref 0.0–0.5)
Eosinophils Relative: 0 %
HCT: 32.7 % — ABNORMAL LOW (ref 36.0–46.0)
Hemoglobin: 10.2 g/dL — ABNORMAL LOW (ref 12.0–15.0)
Immature Granulocytes: 1 %
Lymphocytes Relative: 6 %
Lymphs Abs: 0.4 K/uL — ABNORMAL LOW (ref 0.7–4.0)
MCH: 25.9 pg — ABNORMAL LOW (ref 26.0–34.0)
MCHC: 31.2 g/dL (ref 30.0–36.0)
MCV: 83 fL (ref 80.0–100.0)
Monocytes Absolute: 0.5 K/uL (ref 0.1–1.0)
Monocytes Relative: 10 %
Neutro Abs: 4.5 K/uL (ref 1.7–7.7)
Neutrophils Relative %: 83 %
Platelet Count: 194 K/uL (ref 150–400)
RBC: 3.94 MIL/uL (ref 3.87–5.11)
RDW: 14.9 % (ref 11.5–15.5)
WBC Count: 5.5 K/uL (ref 4.0–10.5)
nRBC: 0 % (ref 0.0–0.2)

## 2024-06-16 LAB — CMP (CANCER CENTER ONLY)
ALT: 10 U/L (ref 0–44)
AST: 14 U/L — ABNORMAL LOW (ref 15–41)
Albumin: 3 g/dL — ABNORMAL LOW (ref 3.5–5.0)
Alkaline Phosphatase: 107 U/L (ref 38–126)
Anion gap: 7 (ref 5–15)
BUN: 18 mg/dL (ref 8–23)
CO2: 34 mmol/L — ABNORMAL HIGH (ref 22–32)
Calcium: 9.8 mg/dL (ref 8.9–10.3)
Chloride: 101 mmol/L (ref 98–111)
Creatinine: 0.58 mg/dL (ref 0.44–1.00)
GFR, Estimated: >60 mL/min (ref >60)
Glucose, Bld: 142 mg/dL — ABNORMAL HIGH (ref 70–99)
Potassium: 3.9 mmol/L (ref 3.5–5.1)
Sodium: 142 mmol/L (ref 135–145)
Total Bilirubin: 0.3 mg/dL (ref 0.0–1.2)
Total Protein: 6.1 g/dL — ABNORMAL LOW (ref 6.5–8.1)

## 2024-06-16 LAB — TSH: TSH: 0.054 u[IU]/mL — ABNORMAL LOW (ref 0.350–4.500)

## 2024-06-16 MED ORDER — SODIUM CHLORIDE 0.9 % IV SOLN
1500.0000 mg | Freq: Once | INTRAVENOUS | Status: AC
  Administered 2024-06-16: 1500 mg via INTRAVENOUS
  Filled 2024-06-16: qty 30

## 2024-06-16 MED ORDER — SODIUM CHLORIDE 0.9 % IV SOLN
INTRAVENOUS | Status: DC
Start: 2024-06-16 — End: 2024-06-16
  Filled 2024-06-16: qty 250

## 2024-06-16 NOTE — Progress Notes (Signed)
 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  INTERVAL HISTORY: Patient returns to clinic today for further evaluation and consideration of cycle 3 of maintenance durvalumab .  She has multiple medical complaints that are chronic and unchanged. Complains of headache this morning without vision changes, nausea. She has ongoing joint pains which are unchanged. She denies neurologic complaints. No rashes. Denies fevers or illness. Appetite si good and she denies weight loss. She has chronic shortness of breath but denies cough, chest pain, or hemoptysis. No nausea, vomiting, constipation, or diarrhea. Denies urinary complaints or unusual fatigue.   REVIEW OF SYSTEMS:   Review of Systems  Constitutional: Negative.  Negative for fever, malaise/fatigue and weight loss.  Respiratory:  Positive for shortness of breath. Negative for cough and hemoptysis.   Cardiovascular: Negative.  Negative for chest pain and leg swelling.  Gastrointestinal: Negative.  Negative for abdominal pain.  Genitourinary: Negative.  Negative for dysuria.  Musculoskeletal:  Positive for back pain and joint pain.  Skin: Negative.  Negative for rash.  Neurological: Negative.  Negative for dizziness, focal weakness, weakness and headaches.  Psychiatric/Behavioral: Negative.  The patient is not nervous/anxious.   As per HPI. Otherwise, a complete review of systems is negative.  PAST MEDICAL HISTORY: Past Medical History:  Diagnosis Date   Acid reflux    COPD (chronic obstructive pulmonary  disease) (HCC)    Diabetes mellitus without complication (HCC)    Hyperlipidemia    Hypertension    Thyroid  disease     PAST SURGICAL HISTORY: Past Surgical History:  Procedure Laterality Date   appendectomy     APPENDECTOMY     BREAST BIOPSY Right    CORE W/CLIP - NEG   ESOPHAGOGASTRODUODENOSCOPY N/A 10/28/2023   Procedure: ESOPHAGOGASTRODUODENOSCOPY (EGD);  Surgeon: Jinny Carmine, MD;  Location: [facility] ENDOSCOPY;  Service: Endoscopy;  Laterality: N/A;   FLEXIBLE BRONCHOSCOPY Bilateral 11/12/2023   Procedure: FLEXIBLE BRONCHOSCOPY;  Surgeon: Parris Manna, MD;  Location: [facility] ORS;  Service: Thoracic;  Laterality: Bilateral;   IR EMBO ART  VEN HEMORR LYMPH EXTRAV  INC GUIDE ROADMAPPING  10/30/2023   IR IMAGING GUIDED PORT INSERTION  01/18/2024   IVC FILTER INSERTION N/A 11/09/2023   Procedure: IVC FILTER INSERTION;  Surgeon: Marea Selinda RAMAN, MD;  Location: [facility] INVASIVE CV LAB;  Service: Cardiovascular;  Laterality: N/A;   TOTAL VAGINAL HYSTERECTOMY     TUMOR REMOVAL     benign;stomach   VIDEO BRONCHOSCOPY WITH ENDOBRONCHIAL NAVIGATION Right 10/20/2023   Procedure: VIDEO BRONCHOSCOPY WITH ENDOBRONCHIAL NAVIGATION;  Surgeon: Parris Manna, MD;  Location: [facility] ORS;  Service: Thoracic;  Laterality: Right;   VIDEO BRONCHOSCOPY WITH ENDOBRONCHIAL NAVIGATION Right 11/12/2023   Procedure: VIDEO BRONCHOSCOPY WITH ENDOBRONCHIAL NAVIGATION;  Surgeon: Parris Manna, MD;  Location: [facility] ORS;  Service: Thoracic;  Laterality: Right;    FAMILY HISTORY: Family History  Problem Relation Age of Onset   Heart attack Mother    Hypertension Mother    Breast cancer Sister 109   Breast cancer Maternal Aunt        40'S   Breast cancer Maternal Grandmother     ADVANCED DIRECTIVES (Y/N):  N  HEALTH MAINTENANCE: Social History   Tobacco Use   Smoking status: Former    Current packs/day: 0.00    Types: Cigarettes    Quit date: 12/15/1985    Years since quitting: 38.5   Smokeless tobacco: Never   Substance Use Topics   Alcohol use: No   Drug use: No     Colonoscopy:  PAP:  Bone density:  Lipid panel:  Allergies  Allergen Reactions   Penicillins Hives   Aspirin  Other (See Comments)    Lips numb   Sulfa Antibiotics     Current Outpatient Medications  Medication Sig Dispense Refill   acetaminophen  (TYLENOL ) 325 MG tablet Take 650 mg by mouth every 6 (six) hours as needed.     aspirin  EC 81 MG tablet Take 81 mg by mouth daily.     atorvastatin  (LIPITOR) 20 MG tablet Take 20 mg by mouth daily.     busPIRone (BUSPAR) 15 MG tablet Take 15 mg by mouth 3 (three) times daily.     calcium  carbonate (TUMS - DOSED IN MG ELEMENTAL CALCIUM ) 500 MG chewable tablet Chew 1 tablet by mouth daily.     cetirizine (ZYRTEC) 10 MG tablet Take 10 mg by mouth daily.     COMBIVENT  RESPIMAT 20-100 MCG/ACT AERS respimat Inhale into the lungs every 6 (six) hours as needed for wheezing or shortness of breath.     diclofenac sodium (VOLTAREN) 1 % GEL Apply topically 4 (four) times daily.     docusate sodium  (COLACE) 100 MG capsule Take 1 capsule (100 mg total) by mouth 2 (two) times daily.     DULERA 200-5 MCG/ACT AERO Inhale 2 puffs into the lungs every 12 (twelve) hours.     furosemide  (LASIX ) 20 MG tablet Take 1 tablet (20 mg total) by mouth daily. 30 tablet 0   glipiZIDE  (GLUCOTROL ) 10 MG tablet Take 10 mg by mouth daily before breakfast.     hydrOXYzine  (ATARAX ) 25 MG tablet Take 25 mg by mouth 3 (three) times daily as needed.     insulin  aspart (NOVOLOG ) 100 UNIT/ML injection Inject 4 Units into the skin 3 (three) times daily with meals.     insulin  glargine-yfgn (SEMGLEE ) 100 UNIT/ML injection Inject 0.18 mLs (18 Units total) into the skin daily.     ipratropium (ATROVENT HFA) 17 MCG/ACT inhaler Inhale 2 puffs into the lungs every 6 (six) hours.     levETIRAcetam  (KEPPRA ) 500 MG tablet Take 1 tablet (500 mg total) by mouth 2 (two) times daily.     levothyroxine  (SYNTHROID ) 150 MCG tablet Take 1  tablet (150 mcg total) by mouth daily before breakfast. Increased from 100.     [Paused] lisinopril -hydrochlorothiazide  (PRINZIDE ,ZESTORETIC ) 20-25 MG per tablet Take 1 tablet by mouth daily.     LORazepam  (ATIVAN ) 0.5 MG tablet Take 0.5 mg by mouth every 8 (eight) hours as needed for anxiety.     Multiple Vitamin (MULTIVITAMIN) capsule Take 1 capsule by mouth daily.     pantoprazole  (PROTONIX ) 40 MG tablet Take 1 tablet (40 mg total) by mouth 2 (two) times daily before a meal.     pregabalin  (LYRICA ) 50 MG capsule Take 50 mg by mouth 2 (two) times daily.     rOPINIRole  (REQUIP  XL) 4 MG 24 hr tablet Take 1 tablet (4 mg total) by mouth at bedtime.     senna (SENOKOT) 8.6 MG TABS tablet Take 1 tablet (8.6 mg total) by mouth in the morning and at bedtime. 120 tablet 3  sertraline  (ZOLOFT ) 100 MG tablet Take 100 mg by mouth daily.     TRADJENTA  5 MG TABS tablet Take 5 mg by mouth daily.     loperamide (IMODIUM A-D) 2 MG tablet Take 2 mg by mouth 4 (four) times daily as needed for diarrhea or loose stools. (Patient not taking: Reported on 06/16/2024)     naloxone (NARCAN) nasal spray 4 mg/0.1 mL Place 1 spray into the nose. (Patient not taking: Reported on 06/16/2024)     ondansetron  (ZOFRAN ) 4 MG/5ML solution Take by mouth once. (Patient not taking: Reported on 06/16/2024)     polyethylene glycol (MIRALAX  / GLYCOLAX ) 17 g packet Take 17 g by mouth daily. (Patient not taking: Reported on 06/16/2024)     No current facility-administered medications for this visit.   Facility-Administered Medications Ordered in Other Visits  Medication Dose Route Frequency Provider Last Rate Last Admin   oxyCODONE  (Oxy IR/ROXICODONE ) immediate release tablet 5 mg  5 mg Oral Once Agrawal, Kavita, MD        OBJECTIVE: Vitals:   06/16/24 0913  BP: 115/70  Pulse: 99  Resp: 16  Temp: 97.6 F (36.4 C)  SpO2: 100%     Body mass index is 28 kg/m.    ECOG FS:1 - Symptomatic but completely ambulatory  General:  Well-developed, well-nourished, mild respiratory distress.  Sitting in wheelchair.   Eyes: Pink conjunctiva, anicteric sclera. HEENT: Normocephalic, moist mucous membranes. Lungs: No audible wheezing or coughing. Heart: Regular rate and rhythm. Abdomen: Soft, nontender, no obvious distention. Musculoskeletal: No edema, cyanosis, or clubbing. Neuro: Alert, answering all questions appropriately. Cranial nerves grossly intact. Skin: No rashes or petechiae noted. Psych: Normal affect.   LAB RESULTS:  Lab Results  Component Value Date   NA 142 06/16/2024   K 3.9 06/16/2024   CL 101 06/16/2024   CO2 34 (H) 06/16/2024   GLUCOSE 142 (H) 06/16/2024   BUN 18 06/16/2024   CREATININE 0.58 06/16/2024   CALCIUM  9.8 06/16/2024   PROT 6.1 (L) 06/16/2024   ALBUMIN 3.0 (L) 06/16/2024   AST 14 (L) 06/16/2024   ALT 10 06/16/2024   ALKPHOS 107 06/16/2024   BILITOT 0.3 06/16/2024   GFRNONAA >60 06/16/2024   GFRAA >60 08/19/2017    Lab Results  Component Value Date   WBC 5.5 06/16/2024   NEUTROABS 4.5 06/16/2024   HGB 10.2 (L) 06/16/2024   HCT 32.7 (L) 06/16/2024   MCV 83.0 06/16/2024   PLT 194 06/16/2024     STUDIES: CT CHEST W CONTRAST Result Date: 05/25/2024 CLINICAL DATA:  Stage IIIA non-small cell lung carcinoma right upper lobe. * Tracking Code: BO * EXAM: CT CHEST WITH CONTRAST TECHNIQUE: Multidetector CT imaging of the chest was performed during intravenous contrast administration. RADIATION DOSE REDUCTION: This exam was performed according to the departmental dose-optimization program which includes automated exposure control, adjustment of the mA and/or kV according to patient size and/or use of iterative reconstruction technique. CONTRAST:  75mL OMNIPAQUE  IOHEXOL  300 MG/ML  SOLN COMPARISON:  PET-CT scan 12/24/2023. Chest CT 03/22/2024. Older exams as well. FINDINGS: Cardiovascular: Heart is nonenlarged. No pericardial effusion. New from previous. Coronary calcifications are seen.  The thoracic aorta has a normal course and caliber with some scattered partially calcified atherosclerotic plaque. Plaque also seen along the great vessels. Left IJ chest port identified with tip extending to the SVC right atrial junction region. Mediastinum/Nodes: Enlarged thyroid  gland. Slightly patulous esophagus. No specific abnormal lymph nodes seen in the axillary regions. Few small  left hilar nodes are new. Example measures 6 mm on series 2, image 55 in short axis. Similar right-sided node is slightly increased. Previously measured lesion that measured 8 x 6 mm, today measures 11 by 7 mm on series 2, image 58. Confluent area more infrahilar on the right on image 71 measures 2.2 by 1.6 cm. Separate small mediastinal nodes are seen and not pathologic by size criteria but again are also slightly larger. AP window node for example today measures 12 by 6 mm on series 2, image 45 and previously 4 x 2 mm. Subcarinal node series 2, image 63 measures 2.6 by 1.2 cm today and previously 2.1 x 0.6 cm. Lungs/Pleura: Left lung has some increasing patchy opacities along the left lower lobe with interstitial components and bronchial wall thickening. Mild bandlike changes as well in the lingula. Apical pleural thickening as well with some interstitial changes and some subtle areas of nodularity. These slightly increased from previous and have a differential. Developing new small right pleural effusion. Adjacent ill-defined parenchymal opacity in the right lower lobe with some consolidative areas particularly towards the superior segment there is also areas of similar nodular opacity and consolidation in the right upper lobe, also new from previous. Previously there were spiculated nodules. The 8 x 5 mm focus in the right upper lobe, today appears larger measuring 11 by 11 mm on series 3, image 38. Dominant lesion in the inferior aspect of the right upper lobe which previously measured 15 x 14 mm, today on image 57 measures 16  by 12 mm. The semi-solid ground-glass area in the middle lobe which previously measured 12 x 7 mm, today on image 64 of series 3 measures 11 by 7 mm. Underlying emphysematous lung changes. Upper Abdomen: Adrenal glands are preserved. Multiple splenic granulomas. Fatty liver infiltration. Gallstones. IVC filter, Greenfield filter seen at the edge of the imaging field. Incompletely included. Musculoskeletal: Scattered degenerative changes. IMPRESSION: Previous right-sided lung nodules are again identified. The smaller more superior upper lobe lesion is slightly larger. The more caudal right upper lobe lesion is similar when adjusted for technique. In the middle lobe semi-solid lesion is similar. However there is a developing small right pleural effusion with multifocal consolidative opacities in the right lower lobe and right upper lobe. This could be an acute inflammatory or infectious process although there is a differential recommend close follow-up. There is also some increasing patchy opacities in the dependent left lower lobe and some subtle reticulonodular changes elsewhere. Again recommend close follow-up. Increasing small lymph nodes identified in the mediastinum and hila, right greater than left. There are some pathologically measurable enlarged nodes in the right hilum and subcarinal region of the mediastinum. With the lung opacity having differential of infectious or inflammatory these could be reactive but again in the setting of neoplasm this also possibility and would recommend close follow-up. Fatty liver infiltration.  Splenic granulomas. Stable enlarged thyroid  gland. Aortic Atherosclerosis (ICD10-I70.0) and Emphysema (ICD10-J43.9). Findings will be called to the ordering service by the Radiology physician assistant team. Electronically Signed   By: Ranell Bring M.D.   On: 05/25/2024 15:48   DG Knee 1-2 Views Right Result Date: 05/19/2024 CLINICAL DATA:  Right knee pain and swelling.  No known  injury. EXAM: RIGHT KNEE - 1-2 VIEW COMPARISON:  None Available. FINDINGS: No evidence of fracture, dislocation, or joint effusion. There is mild lateral compartment joint space narrowing. There also seen is some chondrocalcinosis of the menisci. Soft tissues are unremarkable. IMPRESSION: 1.  Mild lateral compartment osteoarthritis. 2. Chondrocalcinosis of the menisci. Electronically Signed   By: Debby Prader M.D.   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 Tempus liquid biopsy did not reveal any actionable mutations and tumor mutational burden was low.  PET scan results from December 24, 2023 confirmed stage of disease.  MRI of the brain on January 12, 2024 did not reveal any metastatic disease.  She completed her concurrent weekly chemotherapy with carboplatin  and Taxol  along with her daily XRT on approximately Mar 10, 2024.  CT scan of the chest on Mar 22, 2024 revealed stable disease. She had ct chest w/ contrast performed on 05/24/24 which was independently reviewed- right sided nodules are large, previously 8 x 5 mm, now 11 x 11 mm. RUL and RML similar in size. She has new developing consolidative opacities in the RLL and RUL thought to be inflammatory vs infectious. She will see Dr Parris upcoming and will defer management of possible developing COPD exacerbation to him. Once she has been treated we will consider repeating CT imaging vs PET in view of mixed response to treatment. Labs today reviewed and acceptable for continuation of treatment. Proceed with cycle 3 of monthly maintenance durvalumab  today.  Return to clinic in 4 weeks for further evaluation and consideration of cycle 4.   Shortness of breath: Unchanged.  On oxygen. Has not seen pulm since hospitalization in January. I reached out to Dr Parris to  facilitate follow up visit and she is scheduled for later this week.  Pain: Appears to be positional.  Patient currently taking Lyrica  50 mg twice per day.  S/p xray on 05/19/24 which revealed OA.  History of right lower extremity DVT: IVC filter placed on November 09, 2023. Anemia: Chronic and unchanged.  Patient's hemoglobin is 10.2 today.  She has a history of GI bleed. EGD on October 28, 2023 noted bleeding duodenal ulcer treated with clip placement.  Patient also had embolization with IR on October 30, 2023.  She recently received 5 infusions of 200 mg IV Venofer  most recently on Mar 10, 2024. Leukopenia: Resolved. Thrombocytopenia: Resolved. Hypokalemia: Resolved. Hypertension- BP elevated today. She is followed by PCP and will reach out to him for medication adjustments.   Patient expressed understanding and was in agreement with this plan. She also understands that She can call clinic at any time with any questions, concerns, or complaints.    Cancer Staging  Cancer of upper lobe of right lung [facility]) Staging form: Lung, AJCC V9 - Clinical stage from 11/12/2023: Stage IIIA (cT3, cN1, cM0) - Signed by Agrawal, Kavita, MD on 01/07/2024  Tinnie KANDICE Dawn, NP   06/16/2024

## 2024-06-16 NOTE — Patient Instructions (Signed)

## 2024-06-16 NOTE — Progress Notes (Signed)
 Pt reports having a headache this morning.

## 2024-06-17 LAB — T4: T4, Total: 10.2 ug/dL (ref 4.5–12.0)

## 2024-06-21 ENCOUNTER — Other Ambulatory Visit: Payer: Self-pay

## 2024-06-27 ENCOUNTER — Other Ambulatory Visit: Payer: Self-pay

## 2024-07-04 ENCOUNTER — Other Ambulatory Visit: Payer: Self-pay

## 2024-07-11 ENCOUNTER — Other Ambulatory Visit: Payer: Self-pay

## 2024-07-14 ENCOUNTER — Encounter: Payer: Self-pay | Admitting: Oncology

## 2024-07-14 ENCOUNTER — Inpatient Hospital Stay (HOSPITAL_BASED_OUTPATIENT_CLINIC_OR_DEPARTMENT_OTHER): Admitting: Oncology

## 2024-07-14 ENCOUNTER — Inpatient Hospital Stay

## 2024-07-14 ENCOUNTER — Inpatient Hospital Stay: Attending: Internal Medicine

## 2024-07-14 VITALS — BP 97/63 | HR 95 | Temp 98.7°F | Resp 16

## 2024-07-14 VITALS — BP 103/56 | HR 97

## 2024-07-14 DIAGNOSIS — Z7951 Long term (current) use of inhaled steroids: Secondary | ICD-10-CM | POA: Diagnosis not present

## 2024-07-14 DIAGNOSIS — Z86718 Personal history of other venous thrombosis and embolism: Secondary | ICD-10-CM | POA: Insufficient documentation

## 2024-07-14 DIAGNOSIS — C3411 Malignant neoplasm of upper lobe, right bronchus or lung: Secondary | ICD-10-CM | POA: Insufficient documentation

## 2024-07-14 DIAGNOSIS — Z7982 Long term (current) use of aspirin: Secondary | ICD-10-CM | POA: Diagnosis not present

## 2024-07-14 DIAGNOSIS — G8929 Other chronic pain: Secondary | ICD-10-CM | POA: Insufficient documentation

## 2024-07-14 DIAGNOSIS — E785 Hyperlipidemia, unspecified: Secondary | ICD-10-CM | POA: Diagnosis not present

## 2024-07-14 DIAGNOSIS — D649 Anemia, unspecified: Secondary | ICD-10-CM | POA: Insufficient documentation

## 2024-07-14 DIAGNOSIS — K5900 Constipation, unspecified: Secondary | ICD-10-CM | POA: Insufficient documentation

## 2024-07-14 DIAGNOSIS — Z9221 Personal history of antineoplastic chemotherapy: Secondary | ICD-10-CM | POA: Insufficient documentation

## 2024-07-14 DIAGNOSIS — I10 Essential (primary) hypertension: Secondary | ICD-10-CM | POA: Insufficient documentation

## 2024-07-14 DIAGNOSIS — Z803 Family history of malignant neoplasm of breast: Secondary | ICD-10-CM | POA: Diagnosis not present

## 2024-07-14 DIAGNOSIS — Z923 Personal history of irradiation: Secondary | ICD-10-CM | POA: Diagnosis not present

## 2024-07-14 DIAGNOSIS — Z794 Long term (current) use of insulin: Secondary | ICD-10-CM | POA: Diagnosis not present

## 2024-07-14 DIAGNOSIS — Z791 Long term (current) use of non-steroidal anti-inflammatories (NSAID): Secondary | ICD-10-CM | POA: Diagnosis not present

## 2024-07-14 DIAGNOSIS — Z5112 Encounter for antineoplastic immunotherapy: Secondary | ICD-10-CM | POA: Insufficient documentation

## 2024-07-14 DIAGNOSIS — J449 Chronic obstructive pulmonary disease, unspecified: Secondary | ICD-10-CM | POA: Diagnosis not present

## 2024-07-14 DIAGNOSIS — Z79890 Hormone replacement therapy (postmenopausal): Secondary | ICD-10-CM | POA: Insufficient documentation

## 2024-07-14 DIAGNOSIS — Z7984 Long term (current) use of oral hypoglycemic drugs: Secondary | ICD-10-CM | POA: Insufficient documentation

## 2024-07-14 DIAGNOSIS — K264 Chronic or unspecified duodenal ulcer with hemorrhage: Secondary | ICD-10-CM | POA: Insufficient documentation

## 2024-07-14 DIAGNOSIS — Z79899 Other long term (current) drug therapy: Secondary | ICD-10-CM | POA: Insufficient documentation

## 2024-07-14 DIAGNOSIS — E119 Type 2 diabetes mellitus without complications: Secondary | ICD-10-CM | POA: Diagnosis not present

## 2024-07-14 DIAGNOSIS — Z87891 Personal history of nicotine dependence: Secondary | ICD-10-CM | POA: Insufficient documentation

## 2024-07-14 DIAGNOSIS — K219 Gastro-esophageal reflux disease without esophagitis: Secondary | ICD-10-CM | POA: Insufficient documentation

## 2024-07-14 LAB — CMP (CANCER CENTER ONLY)
ALT: 11 U/L (ref 0–44)
AST: 17 U/L (ref 15–41)
Albumin: 2.8 g/dL — ABNORMAL LOW (ref 3.5–5.0)
Alkaline Phosphatase: 85 U/L (ref 38–126)
Anion gap: 7 (ref 5–15)
BUN: 15 mg/dL (ref 8–23)
CO2: 35 mmol/L — ABNORMAL HIGH (ref 22–32)
Calcium: 10.8 mg/dL — ABNORMAL HIGH (ref 8.9–10.3)
Chloride: 98 mmol/L (ref 98–111)
Creatinine: 0.69 mg/dL (ref 0.44–1.00)
GFR, Estimated: >60 mL/min (ref >60)
Glucose, Bld: 101 mg/dL — ABNORMAL HIGH (ref 70–99)
Potassium: 4 mmol/L (ref 3.5–5.1)
Sodium: 140 mmol/L (ref 135–145)
Total Bilirubin: 0.3 mg/dL (ref 0.0–1.2)
Total Protein: 5.7 g/dL — ABNORMAL LOW (ref 6.5–8.1)

## 2024-07-14 LAB — CBC WITH DIFFERENTIAL (CANCER CENTER ONLY)
Abs Immature Granulocytes: 0.05 K/uL (ref 0.00–0.07)
Basophils Absolute: 0 K/uL (ref 0.0–0.1)
Basophils Relative: 0 %
Eosinophils Absolute: 0 K/uL (ref 0.0–0.5)
Eosinophils Relative: 0 %
HCT: 36.3 % (ref 36.0–46.0)
Hemoglobin: 10.6 g/dL — ABNORMAL LOW (ref 12.0–15.0)
Immature Granulocytes: 1 %
Lymphocytes Relative: 5 %
Lymphs Abs: 0.4 K/uL — ABNORMAL LOW (ref 0.7–4.0)
MCH: 24 pg — ABNORMAL LOW (ref 26.0–34.0)
MCHC: 29.2 g/dL — ABNORMAL LOW (ref 30.0–36.0)
MCV: 82.1 fL (ref 80.0–100.0)
Monocytes Absolute: 0.7 K/uL (ref 0.1–1.0)
Monocytes Relative: 9 %
Neutro Abs: 6.4 K/uL (ref 1.7–7.7)
Neutrophils Relative %: 85 %
Platelet Count: 266 K/uL (ref 150–400)
RBC: 4.42 MIL/uL (ref 3.87–5.11)
RDW: 15.5 % (ref 11.5–15.5)
WBC Count: 7.5 K/uL (ref 4.0–10.5)
nRBC: 0 % (ref 0.0–0.2)

## 2024-07-14 MED ORDER — SODIUM CHLORIDE 0.9 % IV SOLN
INTRAVENOUS | Status: DC
  Filled 2024-07-14: qty 250

## 2024-07-14 MED ORDER — SODIUM CHLORIDE 0.9 % IV SOLN
1500.0000 mg | Freq: Once | INTRAVENOUS | Status: AC
  Administered 2024-07-14: 1500 mg via INTRAVENOUS
  Filled 2024-07-14: qty 30

## 2024-07-14 NOTE — Patient Instructions (Signed)

## 2024-07-14 NOTE — Progress Notes (Signed)
 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  INTERVAL HISTORY: Patient returns to clinic today for further evaluation and consideration of cycle 4 of monthly maintenance durvalumab .  She continues to have multiple medical complaints that are chronic and unchanged.  She has increased constipation.  She has no neurologic complaints.  She denies any recent fevers or illnesses.  She has a good appetite and denies weight loss.  She has chronic shortness of breath, but denies any cough, chest pain, or hemoptysis.  She denies any nausea, vomiting, or diarrhea.  She has no urinary complaints.  Patient offers no further specific complaints today.  REVIEW OF SYSTEMS:   Review of Systems  Constitutional: Negative.  Negative for fever, malaise/fatigue and weight loss.  Respiratory:  Positive for shortness of breath. Negative for cough and hemoptysis.   Cardiovascular: Negative.  Negative for chest pain and leg swelling.  Gastrointestinal:  Positive for constipation. Negative for abdominal pain.  Genitourinary: Negative.  Negative for dysuria.  Musculoskeletal:  Positive for back pain and joint pain.  Skin: Negative.  Negative for rash.  Neurological: Negative.  Negative for dizziness, focal weakness, weakness and headaches.  Psychiatric/Behavioral: Negative.  The patient is not nervous/anxious.     As per HPI. Otherwise, a complete review of systems is negative.  PAST MEDICAL HISTORY: Past Medical History:  Diagnosis Date   Acid reflux    COPD (chronic  obstructive pulmonary disease) (HCC)    Diabetes mellitus without complication (HCC)    Hyperlipidemia    Hypertension    Thyroid  disease     PAST SURGICAL HISTORY: Past Surgical History:  Procedure Laterality Date   appendectomy     APPENDECTOMY     BREAST BIOPSY Right    CORE W/CLIP - NEG   ESOPHAGOGASTRODUODENOSCOPY N/A 10/28/2023   Procedure: ESOPHAGOGASTRODUODENOSCOPY (EGD);  Surgeon: Jinny Carmine, MD;  Location[facility] ENDOSCOPY;  Service: Endoscopy;  Laterality: N/A;   FLEXIBLE BRONCHOSCOPY Bilateral 11/12/2023   Procedure: FLEXIBLE BRONCHOSCOPY;  Surgeon: Parris Manna, MD;  Location: [facility] ORS;  Service: Thoracic;  Laterality: Bilateral;   IR EMBO ART  VEN HEMORR LYMPH EXTRAV  INC GUIDE ROADMAPPING  10/30/2023   IR IMAGING GUIDED PORT INSERTION  01/18/2024   IVC FILTER INSERTION N/A 11/09/2023   Procedure: IVC FILTER INSERTION;  Surgeon: Marea Selinda RAMAN, MD;  Location: [facility] INVASIVE CV LAB;  Service: Cardiovascular;  Laterality: N/A;   TOTAL VAGINAL HYSTERECTOMY     TUMOR REMOVAL     benign;stomach   VIDEO BRONCHOSCOPY WITH ENDOBRONCHIAL NAVIGATION Right 10/20/2023   Procedure: VIDEO BRONCHOSCOPY WITH ENDOBRONCHIAL NAVIGATION;  Surgeon: Parris Manna, MD;  Location: [facility] ORS;  Service: Thoracic;  Laterality: Right;   VIDEO BRONCHOSCOPY WITH ENDOBRONCHIAL NAVIGATION Right 11/12/2023   Procedure: VIDEO BRONCHOSCOPY WITH ENDOBRONCHIAL NAVIGATION;  Surgeon: Parris Manna, MD;  Location: [facility] ORS;  Service: Thoracic;  Laterality: Right;    FAMILY HISTORY: Family History  Problem Relation Age of Onset   Heart attack Mother    Hypertension Mother    Breast cancer Sister 45   Breast cancer Maternal Aunt        40'S   Breast  cancer Maternal Grandmother     ADVANCED DIRECTIVES (Y/N):  N  HEALTH MAINTENANCE: Social History   Tobacco Use   Smoking status: Former    Current packs/day: 0.00    Types: Cigarettes    Quit date: 12/15/1985    Years since quitting: 38.6    Smokeless tobacco: Never  Substance Use Topics   Alcohol use: No   Drug use: No     Colonoscopy:  PAP:  Bone density:  Lipid panel:  Allergies  Allergen Reactions   Penicillins Hives   Aspirin  Other (See Comments)    Lips numb   Sulfa Antibiotics     Current Outpatient Medications  Medication Sig Dispense Refill   acetaminophen  (TYLENOL ) 325 MG tablet Take 650 mg by mouth every 6 (six) hours as needed.     aspirin  EC 81 MG tablet Take 81 mg by mouth daily.     atorvastatin  (LIPITOR) 20 MG tablet Take 20 mg by mouth daily.     busPIRone (BUSPAR) 15 MG tablet Take 15 mg by mouth 3 (three) times daily.     calcium  carbonate (TUMS - DOSED IN MG ELEMENTAL CALCIUM ) 500 MG chewable tablet Chew 1 tablet by mouth daily.     cetirizine (ZYRTEC) 10 MG tablet Take 10 mg by mouth daily.     COMBIVENT  RESPIMAT 20-100 MCG/ACT AERS respimat Inhale into the lungs every 6 (six) hours as needed for wheezing or shortness of breath.     diclofenac sodium (VOLTAREN) 1 % GEL Apply topically 4 (four) times daily.     docusate sodium  (COLACE) 100 MG capsule Take 1 capsule (100 mg total) by mouth 2 (two) times daily.     DULERA 200-5 MCG/ACT AERO Inhale 2 puffs into the lungs every 12 (twelve) hours.     furosemide  (LASIX ) 20 MG tablet Take 1 tablet (20 mg total) by mouth daily. 30 tablet 0   glipiZIDE  (GLUCOTROL ) 10 MG tablet Take 10 mg by mouth daily before breakfast.     hydrOXYzine  (ATARAX ) 25 MG tablet Take 25 mg by mouth 3 (three) times daily as needed.     insulin  aspart (NOVOLOG ) 100 UNIT/ML injection Inject 4 Units into the skin 3 (three) times daily with meals.     insulin  glargine-yfgn (SEMGLEE ) 100 UNIT/ML injection Inject 0.18 mLs (18 Units total) into the skin daily.     ipratropium (ATROVENT HFA) 17 MCG/ACT inhaler Inhale 2 puffs into the lungs every 6 (six) hours.     levETIRAcetam  (KEPPRA ) 500 MG tablet Take 1 tablet (500 mg total) by mouth 2 (two) times daily.     levothyroxine   (SYNTHROID ) 150 MCG tablet Take 1 tablet (150 mcg total) by mouth daily before breakfast. Increased from 100.     LORazepam  (ATIVAN ) 0.5 MG tablet Take 0.5 mg by mouth every 8 (eight) hours as needed for anxiety.     Multiple Vitamin (MULTIVITAMIN) capsule Take 1 capsule by mouth daily.     ondansetron  (ZOFRAN ) 4 MG/5ML solution Take by mouth once.     pantoprazole  (PROTONIX ) 40 MG tablet Take 1 tablet (40 mg total) by mouth 2 (two) times daily before a meal.     polyethylene glycol (MIRALAX  / GLYCOLAX ) 17 g packet Take 17 g by mouth daily.     pregabalin  (LYRICA ) 50 MG capsule Take 50 mg by mouth 2 (two) times daily.     rOPINIRole  (REQUIP  XL) 4 MG 24 hr tablet Take 1 tablet (4 mg total) by mouth at bedtime.  senna (SENOKOT) 8.6 MG TABS tablet Take 1 tablet (8.6 mg total) by mouth in the morning and at bedtime. 120 tablet 3   sertraline  (ZOLOFT ) 100 MG tablet Take 100 mg by mouth daily.     TRADJENTA  5 MG TABS tablet Take 5 mg by mouth daily.     [Paused] lisinopril -hydrochlorothiazide  (PRINZIDE ,ZESTORETIC ) 20-25 MG per tablet Take 1 tablet by mouth daily. (Patient not taking: Reported on 07/14/2024)     loperamide (IMODIUM A-D) 2 MG tablet Take 2 mg by mouth 4 (four) times daily as needed for diarrhea or loose stools. (Patient not taking: Reported on 07/14/2024)     naloxone [facility]) nasal spray 4 mg/0.1 mL Place 1 spray into the nose. (Patient not taking: Reported on 07/14/2024)     No current facility-administered medications for this visit.   Facility-Administered Medications Ordered in Other Visits  Medication Dose Route Frequency Provider Last Rate Last Admin   oxyCODONE  (Oxy IR/ROXICODONE ) immediate release tablet 5 mg  5 mg Oral Once Agrawal, Kavita, MD        OBJECTIVE: Vitals:   07/14/24 0853  BP: 97/63  Pulse: 95  Resp: 16  Temp: 98.7 F (37.1 C)  SpO2: 99%     There is no height or weight on file to calculate BMI.    ECOG FS:2 - Symptomatic, <50% confined to  bed  General: Well-developed, well-nourished, no acute distress.  Sitting in a wheelchair. Eyes: Pink conjunctiva, anicteric sclera. HEENT: Normocephalic, moist mucous membranes. Lungs: No audible wheezing or coughing. Heart: Regular rate and rhythm. Abdomen: Soft, nontender, no obvious distention. Musculoskeletal: No edema, cyanosis, or clubbing. Neuro: Alert, answering all questions appropriately. Cranial nerves grossly intact. Skin: No rashes or petechiae noted. Psych: Normal affect.   LAB RESULTS:  Lab Results  Component Value Date   NA 140 07/14/2024   K 4.0 07/14/2024   CL 98 07/14/2024   CO2 35 (H) 07/14/2024   GLUCOSE 101 (H) 07/14/2024   BUN 15 07/14/2024   CREATININE 0.69 07/14/2024   CALCIUM  10.8 (H) 07/14/2024   PROT 5.7 (L) 07/14/2024   ALBUMIN 2.8 (L) 07/14/2024   AST 17 07/14/2024   ALT 11 07/14/2024   ALKPHOS 85 07/14/2024   BILITOT 0.3 07/14/2024   GFRNONAA >60 07/14/2024   GFRAA >60 08/19/2017    Lab Results  Component Value Date   WBC 7.5 07/14/2024   NEUTROABS 6.4 07/14/2024   HGB 10.6 (L) 07/14/2024   HCT 36.3 07/14/2024   MCV 82.1 07/14/2024   PLT 266 07/14/2024     STUDIES: No results found.   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 Tempus liquid biopsy did not reveal any actionable mutations and tumor mutational burden was low.  PET scan results from December 24, 2023 confirmed stage of disease.  MRI of the brain on January 12, 2024 did not reveal any metastatic disease.  She completed her concurrent weekly chemotherapy with carboplatin  and Taxol  along with her daily XRT on approximately Mar 10, 2024.  Her most recent CT scan on May 24, 2024 suggested a mixed response to disease.  Will repeat imaging with PET scan prior to next treatment.  Proceed with cycle 4 of monthly  maintenance durvalumab  today.  Return to clinic in 4 weeks for further evaluation and consideration of cycle 5.    Hypercalcemia: Patient's  calcium  level was 10.8 today, monitor.   Shortness of breath: Chronic and unchanged.  Continue oxygen as needed.  Follow-up with pulmonary as scheduled. Pain: Chronic and unchanged.  Patient currently taking Lyrica  50 mg twice per day.   History of right lower extremity DVT: IVC filter placed on November 09, 2023. Anemia: Chronic and unchanged.  Patient's hemoglobin is stable at 10.6.  She has a history of GI bleed. EGD on October 28, 2023 noted bleeding duodenal ulcer treated with clip placement.  Patient also had embolization with IR on October 30, 2023.  She last received IV Venofer  on Mar 10, 2024. Social situation: Patient was given a referral back to social work.  Patient expressed understanding and was in agreement with this plan. She also understands that She can call clinic at any time with any questions, concerns, or complaints.    Cancer Staging  Cancer of upper lobe of right lung [facility]) Staging form: Lung, AJCC V9 - Clinical stage from 11/12/2023: Stage IIIA (cT3, cN1, cM0) - Signed by Agrawal, Kavita, MD on 01/07/2024   Evalene JINNY Reusing, MD   07/14/2024 9:20 AM

## 2024-07-14 NOTE — Progress Notes (Signed)
 [facility] CSW Progress Note  Clinical Child psychotherapist contacted caregiver by phone to follow-up on emotional support.    Interventions:  Spoke with patient's daughter, Michele House.  She stated patient has displayed increased forgetfulness and hallucinations.  CSW attempted to contact patient via telephone at the nursing home, but was unable to leave a voicemail.       Follow Up Plan:  CSW will follow-up with patient by phone     Michele CHRISTELLA Au, LCSW Clinical Social Worker [facility]    Patient is participating in a Managed Medicaid Plan:  Yes

## 2024-07-14 NOTE — Progress Notes (Signed)
 Patient is doing ok, she is just still unhappy with were she is staying. She is still having some constipation.

## 2024-07-24 ENCOUNTER — Ambulatory Visit
Admission: RE | Admit: 2024-07-24 | Discharge: 2024-07-24 | Disposition: A | Discharge status: Home / Self Care | Source: Ambulatory Visit | Attending: Oncology | Admitting: Oncology

## 2024-07-24 DIAGNOSIS — J90 Pleural effusion, not elsewhere classified: Secondary | ICD-10-CM | POA: Insufficient documentation

## 2024-07-24 DIAGNOSIS — C3411 Malignant neoplasm of upper lobe, right bronchus or lung: Secondary | ICD-10-CM | POA: Insufficient documentation

## 2024-07-24 DIAGNOSIS — N200 Calculus of kidney: Secondary | ICD-10-CM | POA: Insufficient documentation

## 2024-07-24 DIAGNOSIS — I700 Atherosclerosis of aorta: Secondary | ICD-10-CM | POA: Insufficient documentation

## 2024-07-24 DIAGNOSIS — E119 Type 2 diabetes mellitus without complications: Secondary | ICD-10-CM | POA: Diagnosis not present

## 2024-07-24 DIAGNOSIS — K8020 Calculus of gallbladder without cholecystitis without obstruction: Secondary | ICD-10-CM | POA: Diagnosis not present

## 2024-07-24 DIAGNOSIS — Z9221 Personal history of antineoplastic chemotherapy: Secondary | ICD-10-CM | POA: Diagnosis not present

## 2024-07-24 LAB — GLUCOSE, CAPILLARY: Glucose-Capillary: 114 mg/dL — ABNORMAL HIGH (ref 70–99)

## 2024-07-24 MED ORDER — FLUDEOXYGLUCOSE F - 18 (FDG) INJECTION
7.7000 | Freq: Once | INTRAVENOUS | Status: AC | PRN
  Administered 2024-07-24: 7.44 via INTRAVENOUS

## 2024-08-08 ENCOUNTER — Other Ambulatory Visit: Payer: Self-pay

## 2024-08-08 ENCOUNTER — Emergency Department

## 2024-08-08 ENCOUNTER — Emergency Department
Admission: EM | Admit: 2024-08-08 | Discharge: 2024-08-08 | Disposition: A | Discharge status: Skilled Nursing Facility | Source: Skilled Nursing Facility

## 2024-08-08 DIAGNOSIS — E876 Hypokalemia: Secondary | ICD-10-CM | POA: Diagnosis not present

## 2024-08-08 DIAGNOSIS — E119 Type 2 diabetes mellitus without complications: Secondary | ICD-10-CM | POA: Diagnosis not present

## 2024-08-08 DIAGNOSIS — J9801 Acute bronchospasm: Secondary | ICD-10-CM | POA: Insufficient documentation

## 2024-08-08 DIAGNOSIS — R7989 Other specified abnormal findings of blood chemistry: Secondary | ICD-10-CM

## 2024-08-08 DIAGNOSIS — I509 Heart failure, unspecified: Secondary | ICD-10-CM | POA: Insufficient documentation

## 2024-08-08 DIAGNOSIS — J449 Chronic obstructive pulmonary disease, unspecified: Secondary | ICD-10-CM | POA: Diagnosis not present

## 2024-08-08 DIAGNOSIS — J189 Pneumonia, unspecified organism: Secondary | ICD-10-CM | POA: Diagnosis not present

## 2024-08-08 DIAGNOSIS — R0602 Shortness of breath: Secondary | ICD-10-CM | POA: Diagnosis present

## 2024-08-08 LAB — COMPREHENSIVE METABOLIC PANEL WITH GFR
ALT: 9 U/L (ref 0–44)
AST: 21 U/L (ref 15–41)
Albumin: 2.9 g/dL — ABNORMAL LOW (ref 3.5–5.0)
Alkaline Phosphatase: 80 U/L (ref 38–126)
Anion gap: 12 (ref 5–15)
BUN: 18 mg/dL (ref 8–23)
CO2: 38 mmol/L — ABNORMAL HIGH (ref 22–32)
Calcium: 9.8 mg/dL (ref 8.9–10.3)
Chloride: 92 mmol/L — ABNORMAL LOW (ref 98–111)
Creatinine, Ser: 0.73 mg/dL (ref 0.44–1.00)
GFR, Estimated: >60 mL/min (ref >60)
Glucose, Bld: 146 mg/dL — ABNORMAL HIGH (ref 70–99)
Potassium: 3.1 mmol/L — ABNORMAL LOW (ref 3.5–5.1)
Sodium: 142 mmol/L (ref 135–145)
Total Bilirubin: 0.4 mg/dL (ref 0.0–1.2)
Total Protein: 5.5 g/dL — ABNORMAL LOW (ref 6.5–8.1)

## 2024-08-08 LAB — BLOOD GAS, VENOUS
Acid-Base Excess: 16.3 mmol/L — ABNORMAL HIGH (ref 0.0–2.0)
Bicarbonate: 44 mmol/L — ABNORMAL HIGH (ref 20.0–28.0)
Patient temperature: 37
pCO2, Ven: 71 mmHg (ref 44–60)
pH, Ven: 7.4 (ref 7.25–7.43)

## 2024-08-08 LAB — CBC WITH DIFFERENTIAL/PLATELET
Abs Immature Granulocytes: 0.08 K/uL — ABNORMAL HIGH (ref 0.00–0.07)
Basophils Absolute: 0 K/uL (ref 0.0–0.1)
Basophils Relative: 0 %
Eosinophils Absolute: 0 K/uL (ref 0.0–0.5)
Eosinophils Relative: 0 %
HCT: 34.9 % — ABNORMAL LOW (ref 36.0–46.0)
Hemoglobin: 10.6 g/dL — ABNORMAL LOW (ref 12.0–15.0)
Immature Granulocytes: 1 %
Lymphocytes Relative: 3 %
Lymphs Abs: 0.2 K/uL — ABNORMAL LOW (ref 0.7–4.0)
MCH: 24.8 pg — ABNORMAL LOW (ref 26.0–34.0)
MCHC: 30.4 g/dL (ref 30.0–36.0)
MCV: 81.7 fL (ref 80.0–100.0)
Monocytes Absolute: 0.4 K/uL (ref 0.1–1.0)
Monocytes Relative: 5 %
Neutro Abs: 7.6 K/uL (ref 1.7–7.7)
Neutrophils Relative %: 91 %
Platelets: 255 K/uL (ref 150–400)
RBC: 4.27 MIL/uL (ref 3.87–5.11)
RDW: 16.5 % — ABNORMAL HIGH (ref 11.5–15.5)
WBC: 8.4 K/uL (ref 4.0–10.5)
nRBC: 0 % (ref 0.0–0.2)

## 2024-08-08 LAB — BRAIN NATRIURETIC PEPTIDE: B Natriuretic Peptide: 130.1 pg/mL — ABNORMAL HIGH (ref 0.0–100.0)

## 2024-08-08 MED ORDER — POTASSIUM CHLORIDE CRYS ER 20 MEQ PO TBCR
40.0000 meq | EXTENDED_RELEASE_TABLET | Freq: Once | ORAL | Status: AC
Start: 2024-08-08 — End: 2024-08-08
  Administered 2024-08-08: 40 meq via ORAL
  Filled 2024-08-08: qty 2

## 2024-08-08 MED ORDER — AZITHROMYCIN 250 MG PO TABS: 250.0000 mg | ORAL_TABLET | Freq: Every day | ORAL | 0 refills | Status: AC

## 2024-08-08 MED ORDER — ALBUTEROL SULFATE (2.5 MG/3ML) 0.083% IN NEBU
2.5000 mg | INHALATION_SOLUTION | Freq: Once | RESPIRATORY_TRACT | Status: AC
  Administered 2024-08-08: 2.5 mg via RESPIRATORY_TRACT
  Filled 2024-08-08: qty 3

## 2024-08-08 MED ORDER — SODIUM CHLORIDE 0.9 % IV SOLN
500.0000 mg | Freq: Once | INTRAVENOUS | Status: AC
  Administered 2024-08-08: 500 mg via INTRAVENOUS
  Filled 2024-08-08: qty 5

## 2024-08-08 MED ORDER — FUROSEMIDE 10 MG/ML IJ SOLN
20.0000 mg | Freq: Once | INTRAMUSCULAR | Status: AC
  Administered 2024-08-08: 20 mg via INTRAVENOUS
  Filled 2024-08-08: qty 4

## 2024-08-08 MED ORDER — POTASSIUM CHLORIDE CRYS ER 20 MEQ PO TBCR
20.0000 meq | EXTENDED_RELEASE_TABLET | Freq: Once | ORAL | Status: AC
Start: 2024-08-08 — End: 2024-08-08
  Administered 2024-08-08: 20 meq via ORAL
  Filled 2024-08-08: qty 1

## 2024-08-08 NOTE — Discharge Instructions (Signed)
 You were seen in the emergency department for an episode of shortness of breath.  This resolved with EMS delivery of a DuoNeb.  You were given another nebulizing treatment here.  Chest x-ray was unclear whether you had a resolving pneumonia or a new one and decision to treat was made.  I have initiated 5 days of azithromycin  (to be used with your daily ertapenem injection).  Please make sure that your nebulizing treatments are scheduled at least every 4-6 hours.  Please have your potassium, BNP and QT interval (on EKG) checked in 1 week.  Please schedule follow-up appointment with cardiology.  Return if any acutely worsening symptoms or any other emergency. -- RETURN PRECAUTIONS & AFTERCARE: (ENGLISH) RETURN PRECAUTIONS: Return immediately to the emergency department or see/call your doctor if you feel worse, weak or have changes in speech or vision, are short of breath, have fever, vomiting, pain, bleeding or dark stool, trouble urinating or any new issues. Return here or see/call your doctor if not improving as expected for your suspected condition. FOLLOW-UP CARE: Call your doctor and/or any doctors we referred you to for more advice and to make an appointment. Do this today, tomorrow or after the weekend. Some doctors only take PPO insurance so if you have HMO insurance you may want to contact your HMO or your regular doctor for referral to a specialist within your plan. Either way tell the doctor's office that it was a referral from the emergency department so you get the soonest possible appointment.  YOUR TEST RESULTS: Take result reports of any blood or urine tests, imaging tests and EKG's to your doctor and any referral doctor. Have any abnormal tests repeated. Your doctor or a referral doctor can let you know when this should be done. Also make sure your doctor contacts this hospital to get any test results that are not currently available such as cultures or special tests for infection and final  imaging reports, which are often not available at the time you leave the ER but which may list additional important findings that are not documented on the preliminary report. BLOOD PRESSURE: If your blood pressure was greater than 120/80 have your blood pressure rechecked within 1 to 2 weeks. MEDICATION SIDE EFFECTS: Do not drive, walk, bike, take the bus, etc. if you have received or are being prescribed any sedating medications such as those for pain or anxiety or certain antihistamines like Benadryl . If you have been give one of these here get a taxi home or have a friend drive you home. Ask your pharmacist to counsel you on potential side effects of any new medication

## 2024-08-08 NOTE — ED Notes (Signed)
 MD made aware CO2 71 at this time.

## 2024-08-08 NOTE — ED Notes (Signed)
 [facility]  called  for  transport   to [facility]

## 2024-08-08 NOTE — ED Provider Notes (Signed)
 [facility] Provider Note    Event Date/Time   First MD Initiated Contact with Patient 08/08/24 1123     (approximate)   History   Respiratory Distress   HPI  Michele House is a 68 y.o. female  with stage IIA non-small cell carcinoma, DVT with IVC filter, CHF EF 30%, history of anemia with massive GI bleed status post clip placement for duodenal ulcer and IR embolization 10/2023, diabetes, COPD, lower extremity DVT who presents from her skilled nursing facility with an episode of shortness of breath.  Patient tells me that she developed worsening shortness of breath 3 hours prior to EMS arrival.  She tells me that the skilled nursing facility is not very good at delivering her nebulizing treatment as scheduled and today and has not occurred as directed.  She denies any chest pain abdominal pain and was feeling well yesterday.  At the skilled nursing facility she desatted to 81% requiring a nonrebreather.  Upon EMS arrival they noticed wheezing and gave her DuoNeb and patient was subsequently able to be weaned to her home nasal cannula at 3 to 4 L.  Patient reports improvement and states that she is at her baseline.  Denies any cough abdominal pain changes in urinary or bowel habits or any weakness.      Physical Exam   Triage Vital Signs: ED Triage Vitals  Encounter Vitals Group     BP      Girls Systolic BP Percentile      Girls Diastolic BP Percentile      Boys Systolic BP Percentile      Boys Diastolic BP Percentile      Pulse      Resp      Temp      Temp src      SpO2      Weight      Height      Head Circumference      [facility]      Pain Score      Pain Loc      Pain Education      Exclude from Growth Chart     Most recent vital signs: Vitals:   08/08/24 1443 08/08/24 1447  BP:  (!) 106/58  Pulse: 76 79  Resp: 13 12  Temp:    SpO2: 99% 98%    Nursing Triage Note reviewed. Vital signs reviewed and patients oxygen saturation is  normoxic on no home O2  General: Patient is well nourished, well developed, awake and alert, resting comfortably in no acute distress Head: Normocephalic and atraumatic Eyes: Normal inspection, extraocular muscles intact, no conjunctival pallor Ear, nose, throat: Normal external exam Neck: Normal range of motion Respiratory: Patient is in no respiratory distress, lungs mild wheezing Cardiovascular: Patient is not tachycardic, RRR without murmur appreciated GI: Abd SNT with no guarding or rebound  Back: Normal inspection of the back with good strength and range of motion throughout all ext Extremities: pulses intact with good cap refills, no LE pitting edema or calf tenderness Neuro: The patient is alert and oriented to person, place, and time, appropriately conversive, with 5/5 bilat upper extremity strength, lower extremity at baseline Skin: Warm, dry, and intact Psych: normal mood and affect, no SI or HI  ED Results / Procedures / Treatments   Labs (all labs ordered are listed, but only abnormal results are displayed) Labs Reviewed  CBC WITH DIFFERENTIAL/PLATELET - Abnormal; Notable for the following components:  Result Value   Hemoglobin 10.6 (*)    HCT 34.9 (*)    MCH 24.8 (*)    RDW 16.5 (*)    Lymphs Abs 0.2 (*)    Abs Immature Granulocytes 0.08 (*)    All other components within normal limits  COMPREHENSIVE METABOLIC PANEL WITH GFR - Abnormal; Notable for the following components:   Potassium 3.1 (*)    Chloride 92 (*)    CO2 38 (*)    Glucose, Bld 146 (*)    Total Protein 5.5 (*)    Albumin 2.9 (*)    All other components within normal limits  BRAIN NATRIURETIC PEPTIDE - Abnormal; Notable for the following components:   B Natriuretic Peptide 130.1 (*)    All other components within normal limits  BLOOD GAS, VENOUS - Abnormal; Notable for the following components:   pCO2, Ven 71 (*)    Bicarbonate 44.0 (*)    Acid-Base Excess 16.3 (*)    All other components  within normal limits     EKG EKG and rhythm strip are interpreted by myself:   EKG: [Normal sinus rhythm] at heart rate of 86, normal QRS duration, QTc 484, nonspecific ST segments and T waves no ectopy EKG not consistent with Acute STEMI Rhythm strip: NSR in lead II   RADIOLOGY Xray chest: Some opacities on my independent review interpretation and radiologist agrees    PROCEDURES:  Critical Care performed: No  Procedures   MEDICATIONS ORDERED IN ED: Medications  azithromycin  (ZITHROMAX ) 500 mg in sodium chloride  0.9 % 250 mL IVPB (500 mg Intravenous New Bag/Given 08/08/24 1450)  albuterol  (PROVENTIL ) (2.5 MG/3ML) 0.083% nebulizer solution 2.5 mg (2.5 mg Nebulization Given 08/08/24 1212)  potassium chloride  SA (KLOR-CON  M) CR tablet 40 mEq (40 mEq Oral Given 08/08/24 1314)  potassium chloride  SA (KLOR-CON  M) CR tablet 20 mEq (20 mEq Oral Given 08/08/24 1314)  furosemide  (LASIX ) injection 20 mg (20 mg Intravenous Given 08/08/24 1313)     IMPRESSION / MDM / ASSESSMENT AND PLAN / ED COURSE                                Differential diagnosis includes, but is not limited to, bronchospasm, CHF exacerbation atypical pneumonia, electrolyte derangement anemia  ED course: Patient is well-appearing and I am reassured that she feels back to her baseline.  She is on her home O2.  Chest x-ray did demonstrate concerns for atypical infection.  She does tell me that last month she was treated for pneumonia and is unclear to me whether this is a resolving pneumonia or a reoccurrence.  She is on ertapenem at baseline for chronic UTI but will add on azithromycin  for atypical infection of which she received 500 mg here.  Potassium was low and this was repleted.  She did have a slightly more elevated BNP than baseline and she did receive 20 of IV Lasix  with good response.  Patient will return home with 4 additional days of azithromycin .  She was counseled to recheck her BNP, QT prolongation and  potassium levels in 1 week's time.  She is encouraged to remind the skilled nursing facility to do the scheduled nebulizing treatments.  All questions answered and patient voiced understanding and requested discharge   Clinical Course as of 08/08/24 1546  Tue Aug 08, 2024  1242 Potassium(!): 3.1 Will replete [HD]  1257 B Natriuretic Peptide(!): 130.1 Slightly more than baseline [HD]  1321 Blood gas, venous(!!) CO2 is 71 but her pH is 7.4 which strikes me as that she is chronically elevated [HD]  1345 Chest x-ray concerning for atypical infection.  Patient's EKG [HD]  1348 I called patient's daughter.  Updated her on mother's care.  Patient reassessed and feels 100% improved and wants to return back to a skilled nursing facility.  She is on daily ertapenum injections.  [HD]    Clinical Course User Index [HD] Nicholaus Rolland BRAVO, MD   -- Risk: 5 This patient has a high risk of morbidity due to further diagnostic testing or treatment. Rationale: This patient's evaluation and management involve a high risk of morbidity due to the potential severity of presenting symptoms, need for diagnostic testing, and/or initiation of treatment that may require close monitoring. The differential includes conditions with potential for significant deterioration or requiring escalation of care. Treatment decisions in the ED, including medication administration, procedural interventions, or disposition planning, reflect this level of risk. COPA: 5 The patient has the following acute or chronic illness/injury that poses a possible threat to life or bodily function: [X] : The patient has a potentially serious acute condition or an acute exacerbation of a chronic illness requiring urgent evaluation and management in the Emergency Department. The clinical presentation necessitates immediate consideration of life-threatening or function-threatening diagnoses, even if they are ultimately ruled out.   FINAL CLINICAL  IMPRESSION(S) / ED DIAGNOSES   Final diagnoses:  Bronchospasm  Atypical pneumonia  Elevated brain natriuretic peptide (BNP) level  Hypokalemia     Rx / DC Orders   ED Discharge Orders          Ordered    azithromycin  (ZITHROMAX  Z-PAK) 250 MG tablet  Daily        08/08/24 1354             Note:  This document was prepared using Dragon voice recognition software and may include unintentional dictation errors.   Nicholaus Rolland BRAVO, MD 08/08/24 [phone]

## 2024-08-08 NOTE — ED Notes (Signed)
 Pt provided peri care and full linen change. Clean brief and purwick in place at this time.

## 2024-08-08 NOTE — ED Triage Notes (Signed)
 Pt BIB [facility] from [facility] for resp distress. This morning O2 at 81%, increased to 6L then 12L. EMS on scene, pt on NRB 12L at 96%. EMS placed pt on [place] 3-4L with O2 at 90% with wheezing in upper lobe.EMS gave DuoNeb with nebulizer which provided some relief. Pt states now breathing better but has some congestion. facility unable to give norm O2 sats 127/62, 84hr, 97.9,   pt wears 3L [place] at baseline.

## 2024-08-11 ENCOUNTER — Inpatient Hospital Stay (HOSPITAL_BASED_OUTPATIENT_CLINIC_OR_DEPARTMENT_OTHER): Admitting: Oncology

## 2024-08-11 ENCOUNTER — Inpatient Hospital Stay: Attending: Internal Medicine

## 2024-08-11 ENCOUNTER — Inpatient Hospital Stay

## 2024-08-11 ENCOUNTER — Encounter: Payer: Self-pay | Admitting: Oncology

## 2024-08-11 VITALS — BP 110/62 | HR 85 | Temp 96.8°F | Resp 19

## 2024-08-11 VITALS — BP 101/86 | HR 88 | Temp 97.0°F | Resp 18 | Ht 61.0 in | Wt 171.0 lb

## 2024-08-11 DIAGNOSIS — Z794 Long term (current) use of insulin: Secondary | ICD-10-CM | POA: Diagnosis not present

## 2024-08-11 DIAGNOSIS — K264 Chronic or unspecified duodenal ulcer with hemorrhage: Secondary | ICD-10-CM | POA: Diagnosis not present

## 2024-08-11 DIAGNOSIS — Z7982 Long term (current) use of aspirin: Secondary | ICD-10-CM | POA: Insufficient documentation

## 2024-08-11 DIAGNOSIS — Z79890 Hormone replacement therapy (postmenopausal): Secondary | ICD-10-CM | POA: Diagnosis not present

## 2024-08-11 DIAGNOSIS — I700 Atherosclerosis of aorta: Secondary | ICD-10-CM | POA: Diagnosis not present

## 2024-08-11 DIAGNOSIS — Z9221 Personal history of antineoplastic chemotherapy: Secondary | ICD-10-CM | POA: Diagnosis not present

## 2024-08-11 DIAGNOSIS — E119 Type 2 diabetes mellitus without complications: Secondary | ICD-10-CM | POA: Insufficient documentation

## 2024-08-11 DIAGNOSIS — J449 Chronic obstructive pulmonary disease, unspecified: Secondary | ICD-10-CM | POA: Insufficient documentation

## 2024-08-11 DIAGNOSIS — Z9049 Acquired absence of other specified parts of digestive tract: Secondary | ICD-10-CM | POA: Insufficient documentation

## 2024-08-11 DIAGNOSIS — N200 Calculus of kidney: Secondary | ICD-10-CM | POA: Insufficient documentation

## 2024-08-11 DIAGNOSIS — C3411 Malignant neoplasm of upper lobe, right bronchus or lung: Secondary | ICD-10-CM

## 2024-08-11 DIAGNOSIS — Z5112 Encounter for antineoplastic immunotherapy: Secondary | ICD-10-CM | POA: Insufficient documentation

## 2024-08-11 DIAGNOSIS — E785 Hyperlipidemia, unspecified: Secondary | ICD-10-CM | POA: Insufficient documentation

## 2024-08-11 DIAGNOSIS — I119 Hypertensive heart disease without heart failure: Secondary | ICD-10-CM | POA: Insufficient documentation

## 2024-08-11 DIAGNOSIS — K8020 Calculus of gallbladder without cholecystitis without obstruction: Secondary | ICD-10-CM | POA: Diagnosis not present

## 2024-08-11 DIAGNOSIS — Z86718 Personal history of other venous thrombosis and embolism: Secondary | ICD-10-CM | POA: Insufficient documentation

## 2024-08-11 DIAGNOSIS — Z87891 Personal history of nicotine dependence: Secondary | ICD-10-CM | POA: Diagnosis not present

## 2024-08-11 DIAGNOSIS — E079 Disorder of thyroid, unspecified: Secondary | ICD-10-CM | POA: Insufficient documentation

## 2024-08-11 DIAGNOSIS — Z79899 Other long term (current) drug therapy: Secondary | ICD-10-CM | POA: Insufficient documentation

## 2024-08-11 DIAGNOSIS — Z7984 Long term (current) use of oral hypoglycemic drugs: Secondary | ICD-10-CM | POA: Insufficient documentation

## 2024-08-11 DIAGNOSIS — D649 Anemia, unspecified: Secondary | ICD-10-CM | POA: Insufficient documentation

## 2024-08-11 DIAGNOSIS — Z7951 Long term (current) use of inhaled steroids: Secondary | ICD-10-CM | POA: Diagnosis not present

## 2024-08-11 DIAGNOSIS — J90 Pleural effusion, not elsewhere classified: Secondary | ICD-10-CM | POA: Diagnosis not present

## 2024-08-11 DIAGNOSIS — G8929 Other chronic pain: Secondary | ICD-10-CM | POA: Diagnosis not present

## 2024-08-11 DIAGNOSIS — Z791 Long term (current) use of non-steroidal anti-inflammatories (NSAID): Secondary | ICD-10-CM | POA: Diagnosis not present

## 2024-08-11 LAB — CBC WITH DIFFERENTIAL (CANCER CENTER ONLY)
Abs Immature Granulocytes: 0.07 K/uL (ref 0.00–0.07)
Basophils Absolute: 0 K/uL (ref 0.0–0.1)
Basophils Relative: 0 %
Eosinophils Absolute: 0 K/uL (ref 0.0–0.5)
Eosinophils Relative: 0 %
HCT: 35.6 % — ABNORMAL LOW (ref 36.0–46.0)
Hemoglobin: 10.7 g/dL — ABNORMAL LOW (ref 12.0–15.0)
Immature Granulocytes: 1 %
Lymphocytes Relative: 4 %
Lymphs Abs: 0.3 K/uL — ABNORMAL LOW (ref 0.7–4.0)
MCH: 24.5 pg — ABNORMAL LOW (ref 26.0–34.0)
MCHC: 30.1 g/dL (ref 30.0–36.0)
MCV: 81.5 fL (ref 80.0–100.0)
Monocytes Absolute: 0.6 K/uL (ref 0.1–1.0)
Monocytes Relative: 9 %
Neutro Abs: 6.2 K/uL (ref 1.7–7.7)
Neutrophils Relative %: 86 %
Platelet Count: 230 K/uL (ref 150–400)
RBC: 4.37 MIL/uL (ref 3.87–5.11)
RDW: 17.1 % — ABNORMAL HIGH (ref 11.5–15.5)
WBC Count: 7.2 K/uL (ref 4.0–10.5)
nRBC: 0 % (ref 0.0–0.2)

## 2024-08-11 LAB — CMP (CANCER CENTER ONLY)
ALT: 11 U/L (ref 0–44)
AST: 17 U/L (ref 15–41)
Albumin: 2.8 g/dL — ABNORMAL LOW (ref 3.5–5.0)
Alkaline Phosphatase: 75 U/L (ref 38–126)
Anion gap: 8 (ref 5–15)
BUN: 21 mg/dL (ref 8–23)
CO2: 37 mmol/L — ABNORMAL HIGH (ref 22–32)
Calcium: 9.6 mg/dL (ref 8.9–10.3)
Chloride: 95 mmol/L — ABNORMAL LOW (ref 98–111)
Creatinine: 0.71 mg/dL (ref 0.44–1.00)
GFR, Estimated: >60 mL/min (ref >60)
Glucose, Bld: 113 mg/dL — ABNORMAL HIGH (ref 70–99)
Potassium: 4 mmol/L (ref 3.5–5.1)
Sodium: 140 mmol/L (ref 135–145)
Total Bilirubin: 0.4 mg/dL (ref 0.0–1.2)
Total Protein: 5.4 g/dL — ABNORMAL LOW (ref 6.5–8.1)

## 2024-08-11 MED ORDER — SODIUM CHLORIDE 0.9 % IV SOLN
1500.0000 mg | Freq: Once | INTRAVENOUS | Status: AC
  Administered 2024-08-11: 1500 mg via INTRAVENOUS
  Filled 2024-08-11: qty 30

## 2024-08-11 MED ORDER — SODIUM CHLORIDE 0.9 % IV SOLN
INTRAVENOUS | Status: DC
  Filled 2024-08-11: qty 250

## 2024-08-11 NOTE — Progress Notes (Signed)
 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  INTERVAL HISTORY: Patient returns to clinic today for further evaluation and consideration of cycle 5 of monthly maintenance durvalumab .  She continues to have multiple medical complaints that are all chronic in nature and unchanged.  She continues to have chronic shortness of breath. She has no neurologic complaints.  She denies any recent fevers or illnesses.  She has a good appetite and denies weight loss.  She denies any chest pain, cough, or hemoptysis.  She denies any nausea, vomiting, or diarrhea.  She has no urinary complaints.  Patient offers no further specific complaints today.  REVIEW OF SYSTEMS:   Review of Systems  Constitutional:  Positive for malaise/fatigue. Negative for fever and weight loss.  Respiratory:  Positive for shortness of breath. Negative for cough and hemoptysis.   Cardiovascular: Negative.  Negative for chest pain and leg swelling.  Gastrointestinal: Negative.  Negative for abdominal pain and constipation.  Genitourinary: Negative.  Negative for dysuria.  Musculoskeletal:  Positive for back pain and joint pain.  Skin: Negative.  Negative for rash.  Neurological:  Positive for weakness. Negative for dizziness, focal weakness and headaches.  Psychiatric/Behavioral: Negative.  The patient is not nervous/anxious.     As per HPI. Otherwise, a complete review of systems is negative.  PAST MEDICAL HISTORY: Past Medical History:  Diagnosis Date   Acid reflux    COPD (chronic  obstructive pulmonary disease) (HCC)    Diabetes mellitus without complication (HCC)    Hyperlipidemia    Hypertension    Thyroid  disease     PAST SURGICAL HISTORY: Past Surgical History:  Procedure Laterality Date   appendectomy     APPENDECTOMY     BREAST BIOPSY Right    CORE W/CLIP - NEG   ESOPHAGOGASTRODUODENOSCOPY N/A 10/28/2023   Procedure: ESOPHAGOGASTRODUODENOSCOPY (EGD);  Surgeon: Jinny Carmine, MD;  Location: [facility] ENDOSCOPY;  Service: Endoscopy;  Laterality: N/A;   FLEXIBLE BRONCHOSCOPY Bilateral 11/12/2023   Procedure: FLEXIBLE BRONCHOSCOPY;  Surgeon: Parris Manna, MD;  Location: [facility] ORS;  Service: Thoracic;  Laterality: Bilateral;   IR EMBO ART  VEN HEMORR LYMPH EXTRAV  INC GUIDE ROADMAPPING  10/30/2023   IR IMAGING GUIDED PORT INSERTION  01/18/2024   IVC FILTER INSERTION N/A 11/09/2023   Procedure: IVC FILTER INSERTION;  Surgeon: Marea Selinda RAMAN, MD;  Location: [facility] INVASIVE CV LAB;  Service: Cardiovascular;  Laterality: N/A;   TOTAL VAGINAL HYSTERECTOMY     TUMOR REMOVAL     benign;stomach   VIDEO BRONCHOSCOPY WITH ENDOBRONCHIAL NAVIGATION Right 10/20/2023   Procedure: VIDEO BRONCHOSCOPY WITH ENDOBRONCHIAL NAVIGATION;  Surgeon: Parris Manna, MD;  Location: [facility] ORS;  Service: Thoracic;  Laterality: Right;   VIDEO BRONCHOSCOPY WITH ENDOBRONCHIAL NAVIGATION Right 11/12/2023   Procedure: VIDEO BRONCHOSCOPY WITH ENDOBRONCHIAL NAVIGATION;  Surgeon: Parris Manna, MD;  Location: [facility] ORS;  Service: Thoracic;  Laterality: Right;    FAMILY HISTORY: Family History  Problem Relation Age of Onset   Heart attack Mother    Hypertension Mother    Breast cancer Sister 8   Breast cancer Maternal Aunt        40'S  Breast cancer Maternal Grandmother     ADVANCED DIRECTIVES (Y/N):  N  HEALTH MAINTENANCE: Social History   Tobacco Use   Smoking status: Former    Current packs/day: 0.00    Types: Cigarettes    Quit date: 12/15/1985    Years since quitting: 38.6    Smokeless tobacco: Never  Substance Use Topics   Alcohol use: No   Drug use: No     Colonoscopy:  PAP:  Bone density:  Lipid panel:  Allergies  Allergen Reactions   Penicillins Hives   Aspirin  Other (See Comments)    Lips numb   Sulfa Antibiotics     Current Outpatient Medications  Medication Sig Dispense Refill   acetaminophen  (TYLENOL ) 325 MG tablet Take 650 mg by mouth every 6 (six) hours as needed.     aspirin  EC 81 MG tablet Take 81 mg by mouth daily.     atorvastatin  (LIPITOR) 20 MG tablet Take 20 mg by mouth daily.     azithromycin  (ZITHROMAX  Z-PAK) 250 MG tablet Take 1 tablet (250 mg total) by mouth daily for 5 days. Take 2 tablets (500 mg) on  Day 1,  followed by 1 tablet (250 mg) once daily on Days 2 through 5. 5 tablet 0   busPIRone (BUSPAR) 15 MG tablet Take 15 mg by mouth 3 (three) times daily.     calcium  carbonate (TUMS - DOSED IN MG ELEMENTAL CALCIUM ) 500 MG chewable tablet Chew 1 tablet by mouth daily.     cetirizine (ZYRTEC) 10 MG tablet Take 10 mg by mouth daily.     COMBIVENT  RESPIMAT 20-100 MCG/ACT AERS respimat Inhale into the lungs every 6 (six) hours as needed for wheezing or shortness of breath.     diclofenac sodium (VOLTAREN) 1 % GEL Apply topically 4 (four) times daily.     docusate sodium  (COLACE) 100 MG capsule Take 1 capsule (100 mg total) by mouth 2 (two) times daily.     doxycycline (MONODOX) 100 MG capsule Take 100 mg by mouth.     DULERA 200-5 MCG/ACT AERO Inhale 2 puffs into the lungs every 12 (twelve) hours.     furosemide  (LASIX ) 20 MG tablet Take 1 tablet (20 mg total) by mouth daily. 30 tablet 0   glipiZIDE  (GLUCOTROL ) 10 MG tablet Take 10 mg by mouth daily before breakfast.     hydrOXYzine  (ATARAX ) 25 MG tablet Take 25 mg by mouth 3 (three) times daily as needed.     insulin  aspart (NOVOLOG ) 100 UNIT/ML injection Inject 4 Units into the skin 3 (three) times daily with meals.     insulin  glargine-yfgn (SEMGLEE ) 100 UNIT/ML injection Inject  0.18 mLs (18 Units total) into the skin daily.     ipratropium (ATROVENT HFA) 17 MCG/ACT inhaler Inhale 2 puffs into the lungs every 6 (six) hours.     levETIRAcetam  (KEPPRA ) 500 MG tablet Take 1 tablet (500 mg total) by mouth 2 (two) times daily.     levothyroxine  (SYNTHROID ) 150 MCG tablet Take 1 tablet (150 mcg total) by mouth daily before breakfast. Increased from 100.     [Paused] lisinopril -hydrochlorothiazide  (PRINZIDE ,ZESTORETIC ) 20-25 MG per tablet Take 1 tablet by mouth daily.     loperamide (IMODIUM A-D) 2 MG tablet Take 2 mg by mouth 4 (four) times daily as needed for diarrhea or loose stools.     LORazepam  (ATIVAN ) 0.5 MG tablet Take 0.5 mg by mouth every 8 (eight) hours as needed for anxiety.     Multiple Vitamin (  MULTIVITAMIN) capsule Take 1 capsule by mouth daily.     naloxone (NARCAN) nasal spray 4 mg/0.1 mL Place 1 spray into the nose.     ondansetron  (ZOFRAN ) 4 MG/5ML solution Take by mouth once.     pantoprazole  (PROTONIX ) 40 MG tablet Take 1 tablet (40 mg total) by mouth 2 (two) times daily before a meal.     polyethylene glycol (MIRALAX  / GLYCOLAX ) 17 g packet Take 17 g by mouth daily.     predniSONE  (DELTASONE ) 5 MG tablet Take 5 mg by mouth.     pregabalin  (LYRICA ) 50 MG capsule Take 50 mg by mouth 2 (two) times daily.     rOPINIRole  (REQUIP  XL) 4 MG 24 hr tablet Take 1 tablet (4 mg total) by mouth at bedtime.     senna (SENOKOT) 8.6 MG TABS tablet Take 1 tablet (8.6 mg total) by mouth in the morning and at bedtime. 120 tablet 3   sertraline  (ZOLOFT ) 100 MG tablet Take 100 mg by mouth daily.     TRADJENTA  5 MG TABS tablet Take 5 mg by mouth daily.     No current facility-administered medications for this visit.   Facility-Administered Medications Ordered in Other Visits  Medication Dose Route Frequency Provider Last Rate Last Admin   0.9 %  sodium chloride  infusion   Intravenous Continuous Khrista Braun J, MD   Stopped at 08/11/24 1216   oxyCODONE  (Oxy  IR/ROXICODONE ) immediate release tablet 5 mg  5 mg Oral Once Agrawal, Kavita, MD        OBJECTIVE: Vitals:   08/11/24 0953  BP: 101/86  Pulse: 88  Resp: 18  Temp: (!) 97 F (36.1 C)  SpO2: 98%      Body mass index is 32.31 kg/m.    ECOG FS:2 - Symptomatic, <50% confined to bed  General: Well-developed, well-nourished, no acute distress.  Sitting in a wheelchair. Eyes: Pink conjunctiva, anicteric sclera. HEENT: Normocephalic, moist mucous membranes. Lungs: No audible wheezing or coughing. Heart: Regular rate and rhythm. Abdomen: Soft, nontender, no obvious distention. Musculoskeletal: No edema, cyanosis, or clubbing. Neuro: Alert, answering all questions appropriately. Cranial nerves grossly intact. Skin: No rashes or petechiae noted. Psych: Normal affect.  LAB RESULTS:  Lab Results  Component Value Date   NA 140 08/11/2024   K 4.0 08/11/2024   CL 95 (L) 08/11/2024   CO2 37 (H) 08/11/2024   GLUCOSE 113 (H) 08/11/2024   BUN 21 08/11/2024   CREATININE 0.71 08/11/2024   CALCIUM  9.6 08/11/2024   PROT 5.4 (L) 08/11/2024   ALBUMIN 2.8 (L) 08/11/2024   AST 17 08/11/2024   ALT 11 08/11/2024   ALKPHOS 75 08/11/2024   BILITOT 0.4 08/11/2024   GFRNONAA >60 08/11/2024   GFRAA >60 08/19/2017    Lab Results  Component Value Date   WBC 7.2 08/11/2024   NEUTROABS 6.2 08/11/2024   HGB 10.7 (L) 08/11/2024   HCT 35.6 (L) 08/11/2024   MCV 81.5 08/11/2024   PLT 230 08/11/2024     STUDIES: DG Chest 2 View Result Date: 08/08/2024 CLINICAL DATA:  Pleural effusion. EXAM: CHEST - 2 VIEW COMPARISON:  November 12, 2023. FINDINGS: Mild cardiomegaly is noted. Left internal jugular Port-A-Cath is noted with tip in SVC. Mild central pulmonary vascular congestion is noted. Bilateral diffuse interstitial densities are noted concerning for pulmonary edema or possible atypical infection. Focal right upper lobe opacity is noted concerning for focal pneumonia. Small pleural effusions are noted.  Bony thorax is unremarkable. IMPRESSION: Mild cardiomegaly  with mild central pulmonary vascular congestion. Bilateral diffuse interstitial densities are noted concerning for pulmonary edema or possible atypical infection. Small pleural effusions are noted. Focal right upper lobe opacity is noted concerning for possible pneumonia. Electronically Signed   By: Lynwood Landy Raddle M.D.   On: 08/08/2024 13:00   NM PET Image Restag (PS) Skull Base To Thigh Result Date: 07/26/2024 CLINICAL DATA:  Subsequent treatment strategy for lung cancer. EXAM: NUCLEAR MEDICINE PET SKULL BASE TO THIGH TECHNIQUE: 7.4 mCi F-18 FDG was injected intravenously. Full-ring PET imaging was performed from the skull base to thigh after the radiotracer. CT data was obtained and used for attenuation correction and anatomic localization. Fasting blood glucose: 114 mg/dl COMPARISON:  7/78/7974 FINDINGS: Mediastinal blood pool activity: SUV max 2.2 Liver activity: SUV max NA NECK: No significant abnormal hypermetabolic activity in this region. Incidental CT findings: Bilateral common carotid atheromatous vascular calcification. CHEST: Bilateral hypermetabolic airspace opacities, ground-glass opacities, and diffuse subtle nodularity throughout both lungs with extensive hypermetabolic involvement widely distributed along pleural surfaces throughout the airspace opacities. The previous right upper lobe nodule is not individually visible although is probably part of the substantial consolidation in the right upper lobe which has a maximum SUV of 10.7. There is also some consolidated lung centrally in the right upper lobe which is less hypermetabolic. Representative hypermetabolic airspace opacity in the right lower lobe with maximum SUV 19.0. Representative apical activity in the left upper lobe with maximum SUV 10.4. Representative left lower lobe posterior activity with maximum SUV 13.4. Incidental CT findings: Small right pleural effusion. Left  Port-A-Cath tip: SVC. Coronary, aortic arch, and branch vessel atherosclerotic vascular disease. Low-density blood pool favors anemia. ABDOMEN/PELVIS: Physiologic activity in bowel. Incidental CT findings: Cholelithiasis. Vascular coils in the right upper quadrant. Old granulomatous disease of the spleen. Nonobstructive right nephrolithiasis. IVC filter. Atherosclerosis is present, including aortoiliac atherosclerotic disease. Prominent stool throughout the colon favors constipation. SKELETON: No significant abnormal hypermetabolic activity in this region. Incidental CT findings: None. IMPRESSION: 1. Extensive hypermetabolic airspace opacities, ground-glass opacities, and diffuse subtle nodularity throughout both lungs with extensive hypermetabolic involvement along pleural surfaces. No definite low-density component to favor lipoid pneumonia. The appearance favors a multilobar widespread inflammatory or infectious process potentially with granulomatous component given the hypermetabolic activity. A component of the activity may be due to radiation pneumonitis although the bilateral nature of involvement and degree of involvement make radiation pneumonitis unlikely to be the cause of the overall pulmonary process. The original underlying lesion in the right upper lobe is obscured. 2. Small right pleural effusion. 3. Low-density blood pool favors anemia. 4. Cholelithiasis. 5. Nonobstructive right nephrolithiasis. 6. Prominent stool throughout the colon favors constipation. 7.  Aortic Atherosclerosis (ICD10-I70.0). Electronically Signed   By: Ryan Salvage M.D.   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 Tempus liquid biopsy did not reveal any actionable mutations and tumor mutational burden was low.  PET scan results from  December 24, 2023 confirmed stage of disease.  MRI of the brain on January 12, 2024 did not reveal any metastatic disease.  She completed her concurrent weekly chemotherapy with carboplatin  and Taxol  along with her daily XRT on approximately Mar 10, 2024.  Her most recent CT scan on May 24, 2024 suggested a mixed response to disease.  PET scan results from July 26, 2024 difficult to interpret but does not report any progressive disease.  Proceed with cycle 5 of monthly maintenance durvalumab  today.  Return to clinic in 4 weeks for further evaluation and consideration of cycle 6.   Hypercalcemia: Calcium  levels are within normal limits today. Shortness of breath: Chronic and unchanged.  Continue oxygen as needed.  Patient has follow-up with pulmonary in December.   Pain: Chronic and unchanged.  Patient currently taking Lyrica  50 mg twice per day.   History of right lower extremity DVT: IVC filter placed on November 09, 2023. Anemia: Chronic and unchanged.  Patient's hemoglobin is decreased, but stable at 10.7.  She has a history of GI bleed. EGD on October 28, 2023 noted bleeding duodenal ulcer treated with clip placement.  Patient also had embolization with IR on October 30, 2023.  She last received IV Venofer  on Mar 10, 2024. Social situation: Patient was given a referral back to social work.  Patient expressed understanding and was in agreement with this plan. She also understands that She can call clinic at any time with any questions, concerns, or complaints.    Cancer Staging  Cancer of upper lobe of right lung [facility]) Staging form: Lung, AJCC V9 - Clinical stage from 11/12/2023: Stage IIIA (cT3, cN1, cM0) - Signed by Agrawal, Kavita, MD on 01/07/2024   Evalene JINNY Reusing, MD   08/11/2024 3:27 PM

## 2024-08-11 NOTE — Progress Notes (Signed)
 Patient has been having trouble swallowing for the past two days. She said that she is coughing more. She is on 4 liters of o2 today. Her tank ran out so we provided her with one of ours, Marjorie is going to call [facility] to make sure they bring her a new tank when they pick her up.

## 2024-08-11 NOTE — Patient Instructions (Signed)

## 2024-08-18 ENCOUNTER — Other Ambulatory Visit: Payer: Self-pay

## 2024-09-01 ENCOUNTER — Other Ambulatory Visit: Payer: Self-pay

## 2024-09-01 ENCOUNTER — Inpatient Hospital Stay
Admission: EM | Admit: 2024-09-01 | Discharge: 2024-09-04 | DRG: 291 | Disposition: A | Discharge status: Skilled Nursing Facility | Attending: Internal Medicine | Admitting: Internal Medicine

## 2024-09-01 ENCOUNTER — Emergency Department

## 2024-09-01 DIAGNOSIS — Z86718 Personal history of other venous thrombosis and embolism: Secondary | ICD-10-CM

## 2024-09-01 DIAGNOSIS — E1169 Type 2 diabetes mellitus with other specified complication: Secondary | ICD-10-CM | POA: Diagnosis not present

## 2024-09-01 DIAGNOSIS — G40909 Epilepsy, unspecified, not intractable, without status epilepticus: Secondary | ICD-10-CM | POA: Diagnosis present

## 2024-09-01 DIAGNOSIS — Z95828 Presence of other vascular implants and grafts: Secondary | ICD-10-CM | POA: Diagnosis not present

## 2024-09-01 DIAGNOSIS — J9621 Acute and chronic respiratory failure with hypoxia: Secondary | ICD-10-CM | POA: Diagnosis present

## 2024-09-01 DIAGNOSIS — Z8249 Family history of ischemic heart disease and other diseases of the circulatory system: Secondary | ICD-10-CM

## 2024-09-01 DIAGNOSIS — I5023 Acute on chronic systolic (congestive) heart failure: Secondary | ICD-10-CM | POA: Diagnosis present

## 2024-09-01 DIAGNOSIS — Z85118 Personal history of other malignant neoplasm of bronchus and lung: Secondary | ICD-10-CM | POA: Diagnosis not present

## 2024-09-01 DIAGNOSIS — Z1152 Encounter for screening for COVID-19: Secondary | ICD-10-CM | POA: Diagnosis not present

## 2024-09-01 DIAGNOSIS — J9622 Acute and chronic respiratory failure with hypercapnia: Secondary | ICD-10-CM | POA: Diagnosis present

## 2024-09-01 DIAGNOSIS — Z87891 Personal history of nicotine dependence: Secondary | ICD-10-CM

## 2024-09-01 DIAGNOSIS — F419 Anxiety disorder, unspecified: Secondary | ICD-10-CM | POA: Diagnosis present

## 2024-09-01 DIAGNOSIS — Z79890 Hormone replacement therapy (postmenopausal): Secondary | ICD-10-CM | POA: Diagnosis not present

## 2024-09-01 DIAGNOSIS — J9602 Acute respiratory failure with hypercapnia: Secondary | ICD-10-CM | POA: Diagnosis not present

## 2024-09-01 DIAGNOSIS — Z7982 Long term (current) use of aspirin: Secondary | ICD-10-CM

## 2024-09-01 DIAGNOSIS — Z8711 Personal history of peptic ulcer disease: Secondary | ICD-10-CM

## 2024-09-01 DIAGNOSIS — I110 Hypertensive heart disease with heart failure: Principal | ICD-10-CM | POA: Diagnosis present

## 2024-09-01 DIAGNOSIS — E785 Hyperlipidemia, unspecified: Secondary | ICD-10-CM | POA: Diagnosis present

## 2024-09-01 DIAGNOSIS — E039 Hypothyroidism, unspecified: Secondary | ICD-10-CM | POA: Diagnosis present

## 2024-09-01 DIAGNOSIS — Z794 Long term (current) use of insulin: Secondary | ICD-10-CM | POA: Diagnosis not present

## 2024-09-01 DIAGNOSIS — Z7951 Long term (current) use of inhaled steroids: Secondary | ICD-10-CM | POA: Diagnosis not present

## 2024-09-01 DIAGNOSIS — F32A Depression, unspecified: Secondary | ICD-10-CM | POA: Diagnosis present

## 2024-09-01 DIAGNOSIS — L89153 Pressure ulcer of sacral region, stage 3: Secondary | ICD-10-CM | POA: Diagnosis present

## 2024-09-01 DIAGNOSIS — Z886 Allergy status to analgesic agent status: Secondary | ICD-10-CM

## 2024-09-01 DIAGNOSIS — L8990 Pressure ulcer of unspecified site, unspecified stage: Secondary | ICD-10-CM | POA: Insufficient documentation

## 2024-09-01 DIAGNOSIS — Z882 Allergy status to sulfonamides status: Secondary | ICD-10-CM

## 2024-09-01 DIAGNOSIS — E1140 Type 2 diabetes mellitus with diabetic neuropathy, unspecified: Secondary | ICD-10-CM | POA: Diagnosis present

## 2024-09-01 DIAGNOSIS — J9601 Acute respiratory failure with hypoxia: Secondary | ICD-10-CM

## 2024-09-01 DIAGNOSIS — J441 Chronic obstructive pulmonary disease with (acute) exacerbation: Principal | ICD-10-CM | POA: Diagnosis present

## 2024-09-01 DIAGNOSIS — E872 Acidosis, unspecified: Secondary | ICD-10-CM | POA: Diagnosis present

## 2024-09-01 DIAGNOSIS — E119 Type 2 diabetes mellitus without complications: Secondary | ICD-10-CM

## 2024-09-01 DIAGNOSIS — J449 Chronic obstructive pulmonary disease, unspecified: Secondary | ICD-10-CM | POA: Diagnosis present

## 2024-09-01 DIAGNOSIS — Z7984 Long term (current) use of oral hypoglycemic drugs: Secondary | ICD-10-CM | POA: Diagnosis not present

## 2024-09-01 DIAGNOSIS — Z9981 Dependence on supplemental oxygen: Secondary | ICD-10-CM | POA: Diagnosis not present

## 2024-09-01 DIAGNOSIS — Z880 Allergy status to penicillin: Secondary | ICD-10-CM

## 2024-09-01 LAB — COMPREHENSIVE METABOLIC PANEL WITH GFR
ALT: 8 U/L (ref 0–44)
AST: 15 U/L (ref 15–41)
Albumin: 2.7 g/dL — ABNORMAL LOW (ref 3.5–5.0)
Alkaline Phosphatase: 82 U/L (ref 38–126)
Anion gap: 14 (ref 5–15)
BUN: 19 mg/dL (ref 8–23)
CO2: 39 mmol/L — ABNORMAL HIGH (ref 22–32)
Calcium: 11.1 mg/dL — ABNORMAL HIGH (ref 8.9–10.3)
Chloride: 93 mmol/L — ABNORMAL LOW (ref 98–111)
Creatinine, Ser: 0.67 mg/dL (ref 0.44–1.00)
GFR, Estimated: >60 mL/min (ref >60)
Glucose, Bld: 191 mg/dL — ABNORMAL HIGH (ref 70–99)
Potassium: 3.7 mmol/L (ref 3.5–5.1)
Sodium: 146 mmol/L — ABNORMAL HIGH (ref 135–145)
Total Bilirubin: 0.4 mg/dL (ref 0.0–1.2)
Total Protein: 5.6 g/dL — ABNORMAL LOW (ref 6.5–8.1)

## 2024-09-01 LAB — GLUCOSE, CAPILLARY
Glucose-Capillary: 235 mg/dL — ABNORMAL HIGH (ref 70–99)
Glucose-Capillary: 272 mg/dL — ABNORMAL HIGH (ref 70–99)

## 2024-09-01 LAB — RESPIRATORY PANEL BY PCR

## 2024-09-01 LAB — BLOOD GAS, VENOUS
Acid-Base Excess: 19.3 mmol/L — ABNORMAL HIGH (ref 0.0–2.0)
Bicarbonate: 49.7 mmol/L — ABNORMAL HIGH (ref 20.0–28.0)
O2 Saturation: 70.9 %
Patient temperature: 37
pCO2, Ven: 88 mmHg (ref 44–60)
pH, Ven: 7.36 (ref 7.25–7.43)
pO2, Ven: 45 mmHg (ref 32–45)

## 2024-09-01 LAB — CBC WITH DIFFERENTIAL/PLATELET
Abs Immature Granulocytes: 0.01 K/uL (ref 0.00–0.07)
Basophils Absolute: 0 K/uL (ref 0.0–0.1)
Basophils Relative: 0 %
Eosinophils Absolute: 0 K/uL (ref 0.0–0.5)
Eosinophils Relative: 0 %
HCT: 33.9 % — ABNORMAL LOW (ref 36.0–46.0)
Hemoglobin: 9.9 g/dL — ABNORMAL LOW (ref 12.0–15.0)
Immature Granulocytes: 0 %
Lymphocytes Relative: 6 %
Lymphs Abs: 0.2 K/uL — ABNORMAL LOW (ref 0.7–4.0)
MCH: 25 pg — ABNORMAL LOW (ref 26.0–34.0)
MCHC: 29.2 g/dL — ABNORMAL LOW (ref 30.0–36.0)
MCV: 85.6 fL (ref 80.0–100.0)
Monocytes Absolute: 0.4 K/uL (ref 0.1–1.0)
Monocytes Relative: 11 %
Neutro Abs: 2.9 K/uL (ref 1.7–7.7)
Neutrophils Relative %: 83 %
Platelets: 197 K/uL (ref 150–400)
RBC: 3.96 MIL/uL (ref 3.87–5.11)
RDW: 16.3 % — ABNORMAL HIGH (ref 11.5–15.5)
WBC: 3.5 K/uL — ABNORMAL LOW (ref 4.0–10.5)
nRBC: 0 % (ref 0.0–0.2)

## 2024-09-01 LAB — RETICULOCYTES
Immature Retic Fract: 24.8 % — ABNORMAL HIGH (ref 2.3–15.9)
RBC.: 4.17 MIL/uL (ref 3.87–5.11)
Retic Count, Absolute: 101.3 K/uL (ref 19.0–186.0)
Retic Ct Pct: 2.4 % (ref 0.4–3.1)

## 2024-09-01 LAB — LACTIC ACID, PLASMA
Lactic Acid, Venous: 2.8 mmol/L (ref 0.5–1.9)
Lactic Acid, Venous: 2.4 mmol/L (ref 0.5–1.9)

## 2024-09-01 LAB — RESP PANEL BY RT-PCR (RSV, FLU A&B, COVID)  RVPGX2
Influenza A by PCR: NEGATIVE
Influenza B by PCR: NEGATIVE
Resp Syncytial Virus by PCR: NEGATIVE
SARS Coronavirus 2 by RT PCR: NEGATIVE

## 2024-09-01 LAB — BRAIN NATRIURETIC PEPTIDE: B Natriuretic Peptide: 172.7 pg/mL — ABNORMAL HIGH (ref 0.0–100.0)

## 2024-09-01 LAB — HEMOGLOBIN A1C
Hgb A1c MFr Bld: 5.8 % — ABNORMAL HIGH (ref 4.8–5.6)
Mean Plasma Glucose: 119.76 mg/dL

## 2024-09-01 LAB — IRON AND TIBC
Iron: 21 ug/dL — ABNORMAL LOW (ref 28–170)
Saturation Ratios: 9 % — ABNORMAL LOW (ref 10.4–31.8)
TIBC: 242 ug/dL — ABNORMAL LOW (ref 250–450)
UIBC: 221 ug/dL

## 2024-09-01 LAB — FERRITIN: Ferritin: 290 ng/mL (ref 11–307)

## 2024-09-01 LAB — CBG MONITORING, ED: Glucose-Capillary: 205 mg/dL — ABNORMAL HIGH (ref 70–99)

## 2024-09-01 LAB — TROPONIN I (HIGH SENSITIVITY)
Troponin I (High Sensitivity): 13 ng/L (ref <18)
Troponin I (High Sensitivity): 12 ng/L

## 2024-09-01 MED ORDER — COLLAGENASE 250 UNIT/GM EX OINT
TOPICAL_OINTMENT | Freq: Every day | CUTANEOUS | Status: DC
  Filled 2024-09-01: qty 30

## 2024-09-01 MED ORDER — FERROUS SULFATE 325 (65 FE) MG PO TABS
325.0000 mg | ORAL_TABLET | Freq: Two times a day (BID) | ORAL | Status: DC
  Administered 2024-09-02 – 2024-09-04 (×6): 325 mg via ORAL
  Filled 2024-09-01 (×6): qty 1

## 2024-09-01 MED ORDER — LEVETIRACETAM 500 MG PO TABS
500.0000 mg | ORAL_TABLET | Freq: Two times a day (BID) | ORAL | Status: DC
Start: 2024-09-01 — End: 2024-09-05
  Administered 2024-09-01 – 2024-09-04 (×7): 500 mg via ORAL
  Filled 2024-09-01 (×7): qty 1

## 2024-09-01 MED ORDER — IPRATROPIUM-ALBUTEROL 0.5-2.5 (3) MG/3ML IN SOLN
3.0000 mL | Freq: Four times a day (QID) | RESPIRATORY_TRACT | Status: DC
  Administered 2024-09-01 – 2024-09-02 (×5): 3 mL via RESPIRATORY_TRACT
  Filled 2024-09-01 (×5): qty 3

## 2024-09-01 MED ORDER — SERTRALINE HCL 50 MG PO TABS
100.0000 mg | ORAL_TABLET | Freq: Every day | ORAL | Status: DC
Start: 2024-09-01 — End: 2024-09-05
  Administered 2024-09-01 – 2024-09-04 (×4): 100 mg via ORAL
  Filled 2024-09-01 (×4): qty 2

## 2024-09-01 MED ORDER — SODIUM CHLORIDE 0.9 % IV SOLN: 250.0000 mL | INTRAVENOUS | Status: AC | PRN

## 2024-09-01 MED ORDER — INSULIN GLARGINE-YFGN 100 UNIT/ML ~~LOC~~ SOLN
15.0000 [IU] | Freq: Every day | SUBCUTANEOUS | Status: DC
  Administered 2024-09-01 – 2024-09-04 (×4): 15 [IU] via SUBCUTANEOUS
  Filled 2024-09-01 (×4): qty 0.15

## 2024-09-01 MED ORDER — SODIUM CHLORIDE 0.9 % IV SOLN
2.0000 g | Freq: Once | INTRAVENOUS | Status: AC
  Administered 2024-09-01: 2 g via INTRAVENOUS
  Filled 2024-09-01: qty 12.5

## 2024-09-01 MED ORDER — SODIUM CHLORIDE 0.9 % IV SOLN
500.0000 mg | INTRAVENOUS | Status: DC
  Administered 2024-09-01: 500 mg via INTRAVENOUS
  Filled 2024-09-01 (×2): qty 5

## 2024-09-01 MED ORDER — LEVOTHYROXINE SODIUM 50 MCG PO TABS: 150.0000 ug | ORAL_TABLET | Freq: Every day | ORAL | Status: DC

## 2024-09-01 MED ORDER — PREGABALIN 50 MG PO CAPS
50.0000 mg | ORAL_CAPSULE | Freq: Two times a day (BID) | ORAL | Status: DC
  Administered 2024-09-01 – 2024-09-04 (×7): 50 mg via ORAL
  Filled 2024-09-01 (×7): qty 1

## 2024-09-01 MED ORDER — ACETAMINOPHEN 325 MG PO TABS
650.0000 mg | ORAL_TABLET | Freq: Four times a day (QID) | ORAL | Status: DC | PRN
  Administered 2024-09-02 – 2024-09-04 (×3): 650 mg via ORAL
  Filled 2024-09-01 (×2): qty 2

## 2024-09-01 MED ORDER — DOXYCYCLINE HYCLATE 100 MG PO TABS
100.0000 mg | ORAL_TABLET | Freq: Two times a day (BID) | ORAL | Status: DC
  Administered 2024-09-01 – 2024-09-04 (×7): 100 mg via ORAL
  Filled 2024-09-01 (×7): qty 1

## 2024-09-01 MED ORDER — PREDNISONE 20 MG PO TABS
40.0000 mg | ORAL_TABLET | Freq: Every day | ORAL | Status: DC
  Administered 2024-09-02 – 2024-09-04 (×3): 40 mg via ORAL
  Filled 2024-09-01 (×3): qty 2

## 2024-09-01 MED ORDER — ASPIRIN 81 MG PO TBEC
81.0000 mg | DELAYED_RELEASE_TABLET | Freq: Every day | ORAL | Status: DC
  Administered 2024-09-01 – 2024-09-04 (×4): 81 mg via ORAL
  Filled 2024-09-01 (×4): qty 1

## 2024-09-01 MED ORDER — ATORVASTATIN CALCIUM 20 MG PO TABS
20.0000 mg | ORAL_TABLET | Freq: Every day | ORAL | Status: DC
Start: 2024-09-01 — End: 2024-09-05
  Administered 2024-09-01 – 2024-09-04 (×4): 20 mg via ORAL
  Filled 2024-09-01 (×4): qty 1

## 2024-09-01 MED ORDER — ALBUTEROL SULFATE (2.5 MG/3ML) 0.083% IN NEBU: 2.5000 mg | INHALATION_SOLUTION | RESPIRATORY_TRACT | Status: DC | PRN

## 2024-09-01 MED ORDER — PANTOPRAZOLE SODIUM 40 MG PO TBEC
40.0000 mg | DELAYED_RELEASE_TABLET | Freq: Two times a day (BID) | ORAL | Status: DC
  Administered 2024-09-01 – 2024-09-04 (×7): 40 mg via ORAL
  Filled 2024-09-01 (×7): qty 1

## 2024-09-01 MED ORDER — POLYETHYLENE GLYCOL 3350 17 G PO PACK
17.0000 g | PACK | Freq: Every day | ORAL | Status: DC
  Administered 2024-09-01 – 2024-09-04 (×3): 17 g via ORAL
  Filled 2024-09-01 (×4): qty 1

## 2024-09-01 MED ORDER — ACETAMINOPHEN 325 MG PO TABS: 650.0000 mg | ORAL_TABLET | ORAL | Status: DC | PRN

## 2024-09-01 MED ORDER — FUROSEMIDE 20 MG PO TABS
20.0000 mg | ORAL_TABLET | Freq: Every day | ORAL | Status: DC
  Administered 2024-09-02 – 2024-09-04 (×3): 20 mg via ORAL
  Filled 2024-09-01 (×3): qty 1

## 2024-09-01 MED ORDER — ENOXAPARIN SODIUM 40 MG/0.4ML IJ SOSY
40.0000 mg | PREFILLED_SYRINGE | INTRAMUSCULAR | Status: DC
  Administered 2024-09-01 – 2024-09-03 (×3): 40 mg via SUBCUTANEOUS
  Filled 2024-09-01 (×3): qty 0.4

## 2024-09-01 MED ORDER — LEVOTHYROXINE SODIUM 25 MCG PO TABS
125.0000 ug | ORAL_TABLET | Freq: Every day | ORAL | Status: DC
  Administered 2024-09-02 – 2024-09-04 (×3): 125 ug via ORAL
  Filled 2024-09-01 (×2): qty 1

## 2024-09-01 MED ORDER — ONDANSETRON HCL 4 MG/2ML IJ SOLN: 4.0000 mg | Freq: Four times a day (QID) | INTRAMUSCULAR | Status: DC | PRN

## 2024-09-01 MED ORDER — SENNA 8.6 MG PO TABS: 1.0000 | ORAL_TABLET | Freq: Every evening | ORAL | Status: DC | PRN

## 2024-09-01 MED ORDER — METHYLPREDNISOLONE SODIUM SUCC 40 MG IJ SOLR
40.0000 mg | Freq: Two times a day (BID) | INTRAMUSCULAR | Status: AC
  Administered 2024-09-01 (×2): 40 mg via INTRAVENOUS
  Filled 2024-09-01 (×2): qty 1

## 2024-09-01 MED ORDER — FLUTICASONE FUROATE-VILANTEROL 100-25 MCG/ACT IN AEPB
1.0000 | INHALATION_SPRAY | Freq: Every day | RESPIRATORY_TRACT | Status: DC
  Administered 2024-09-01 – 2024-09-04 (×4): 1 via RESPIRATORY_TRACT
  Filled 2024-09-01: qty 28

## 2024-09-01 MED ORDER — FUROSEMIDE 10 MG/ML IJ SOLN
40.0000 mg | Freq: Once | INTRAMUSCULAR | Status: AC
  Administered 2024-09-01: 40 mg via INTRAVENOUS
  Filled 2024-09-01: qty 4

## 2024-09-01 MED ORDER — ROPINIROLE HCL ER 4 MG PO TB24
4.0000 mg | ORAL_TABLET | Freq: Every day | ORAL | Status: DC
  Administered 2024-09-01 – 2024-09-03 (×3): 4 mg via ORAL
  Filled 2024-09-01 (×5): qty 1

## 2024-09-01 MED ORDER — BUSPIRONE HCL 5 MG PO TABS: 15.0000 mg | ORAL_TABLET | Freq: Three times a day (TID) | ORAL | Status: DC

## 2024-09-01 MED ORDER — IPRATROPIUM-ALBUTEROL 0.5-2.5 (3) MG/3ML IN SOLN
9.0000 mL | Freq: Once | RESPIRATORY_TRACT | Status: AC
  Administered 2024-09-01: 9 mL via RESPIRATORY_TRACT
  Filled 2024-09-01: qty 3

## 2024-09-01 MED ORDER — METHYLPREDNISOLONE SODIUM SUCC 125 MG IJ SOLR
125.0000 mg | Freq: Once | INTRAMUSCULAR | Status: AC
  Administered 2024-09-01: 125 mg via INTRAVENOUS
  Filled 2024-09-01: qty 2

## 2024-09-01 MED ORDER — SODIUM CHLORIDE 0.9% FLUSH: 3.0000 mL | INTRAVENOUS | Status: DC | PRN

## 2024-09-01 MED ORDER — BUSPIRONE HCL 10 MG PO TABS
20.0000 mg | ORAL_TABLET | Freq: Two times a day (BID) | ORAL | Status: DC
  Administered 2024-09-01 – 2024-09-04 (×7): 20 mg via ORAL
  Filled 2024-09-01 (×3): qty 2
  Filled 2024-09-01: qty 4
  Filled 2024-09-01 (×3): qty 2

## 2024-09-01 MED ORDER — SODIUM CHLORIDE 0.9% FLUSH
3.0000 mL | Freq: Two times a day (BID) | INTRAVENOUS | Status: DC
  Administered 2024-09-01 – 2024-09-04 (×7): 3 mL via INTRAVENOUS

## 2024-09-01 MED ORDER — ACETAZOLAMIDE SODIUM 500 MG IJ SOLR
500.0000 mg | Freq: Once | INTRAMUSCULAR | Status: AC
  Administered 2024-09-01: 500 mg via INTRAVENOUS
  Filled 2024-09-01: qty 500

## 2024-09-01 MED ORDER — LORAZEPAM 0.5 MG PO TABS: 0.5000 mg | ORAL_TABLET | Freq: Three times a day (TID) | ORAL | Status: DC | PRN

## 2024-09-01 MED ORDER — INSULIN ASPART 100 UNIT/ML IJ SOLN
0.0000 [IU] | Freq: Three times a day (TID) | INTRAMUSCULAR | Status: DC
  Administered 2024-09-01 (×2): 5 [IU] via SUBCUTANEOUS
  Administered 2024-09-02: 8 [IU] via SUBCUTANEOUS
  Administered 2024-09-02: 5 [IU] via SUBCUTANEOUS
  Administered 2024-09-02: 3 [IU] via SUBCUTANEOUS
  Administered 2024-09-03: 5 [IU] via SUBCUTANEOUS
  Administered 2024-09-03 – 2024-09-04 (×2): 11 [IU] via SUBCUTANEOUS
  Administered 2024-09-04: 8 [IU] via SUBCUTANEOUS
  Filled 2024-09-01 (×10): qty 1

## 2024-09-01 MED ORDER — LORATADINE 10 MG PO TABS
10.0000 mg | ORAL_TABLET | Freq: Every day | ORAL | Status: DC
  Administered 2024-09-01 – 2024-09-04 (×4): 10 mg via ORAL
  Filled 2024-09-01 (×4): qty 1

## 2024-09-01 NOTE — Consult Note (Signed)
 WOC Nurse Consult Note: Reason for Consult: stage 3 pressure injury sacrum  Wound type: Stage 3 Pressure Injury Sacrum 50% pink 50% tan  Pressure Injury POA: Yes Measurement: see nursing flowsheet  Wound bed: as above  Drainage (amount, consistency, odor)  see nursing flowsheet   Periwound:  erythema and peeling skin  Dressing procedure/placement/frequency: Cleanse sacral wound with NS, Apply 1/4 thick layer of Santyl to wound bed, top with saline moist gauze, dry gauze and ABD pad and tape or silicone foam whichever is preferred.   POC discussed with bedside nurse. WOC team will not follow. Re-consult if further needs arise.   Thank you,    Powell Bar MSN, RN-BC, [employer]

## 2024-09-01 NOTE — Progress Notes (Signed)
 Dr. Laurita spoke with patient regarding code status. Patient wants to remain full code.   Yes, just asked her, she is still full code now  Dr. Laurita.

## 2024-09-01 NOTE — Progress Notes (Addendum)
 Upon admission assessment. Patient has right sided weakness. Per patient it is from previous stroke. Additionally, patient has a stage 3 pressure injury to sacrum area. MD aware, skin care orders placed and wound nurse consulted.

## 2024-09-01 NOTE — Progress Notes (Signed)
 Patient admitted to PCU from ED. Upon arrival, patient placed on continuous cardiac telemetry monitoring. Vital signs obtained and documented. Patient oriented to room, call bell system, and unit routines. Safety measures implemented: bed in lowest position, call bell in reach, and side rails up x3. Patient repositioned for comfort and skin protection. Initial head-to-toes assessment completed. Patient resting comfortably in bed, no acute distress noted at this time.

## 2024-09-01 NOTE — Progress Notes (Signed)
 PT TRANSPORTED FROM ER-8 TO ER-33 ON V60 BIPAP WITH NO COMPLICATIONS

## 2024-09-01 NOTE — Evaluation (Signed)
 Occupational Therapy Evaluation Patient Details Name: Michele House MRN: [id number] DOB: [birth year] Today's Date: 09/01/2024   History of Present Illness         Clinical Impressions Patient was seen for OT evaluation this date. Prior to hospital admission, patient was living in long term care, total A for transfers and ADLs, patient is at baseline. Patient reports difficulty feeding self, OT assessed and likely due to IV placement in LUE, son present at OT departure and agreeable to assist. Patient also incontinent with OT present, OT/RN performed total care perciare/linen change, cued patient onrolling techniques with fair return demo. Patient is at baseline and does not require follow up OT, OT to sign off.       If plan is discharge home, recommend the following:   A lot of help with walking and/or transfers;A lot of help with bathing/dressing/bathroom     Functional Status Assessment         Equipment Recommendations   None recommended by OT     Recommendations for Other Services         Precautions/Restrictions   Precautions Precautions: Fall Recall of Precautions/Restrictions: Impaired Restrictions Weight Bearing Restrictions Per Provider Order: No     Mobility Bed Mobility Overal bed mobility: Needs Assistance             General bed mobility comments: reports staff A with all parts of bed mobility, patient at baseline    Transfers Overall transfer level: Needs assistance Equipment used: None (hoyer lift)                      Balance                                           ADL either performed or assessed with clinical judgement   ADL Overall ADL's : At baseline                                       General ADL Comments: [facility] perform all parts of ADLs     Vision         Perception         Praxis         Pertinent Vitals/Pain Pain Assessment Pain Assessment:  No/denies pain     Extremity/Trunk Assessment Upper Extremity Assessment Upper Extremity Assessment: Generalized weakness   Lower Extremity Assessment Lower Extremity Assessment: Defer to PT evaluation       Communication Communication Communication: Impaired Factors Affecting Communication: Difficulty expressing self   Cognition Arousal: Alert Behavior During Therapy: WFL for tasks assessed/performed Cognition: History of cognitive impairments                               Following commands: Impaired Following commands impaired: Follows one step commands with increased time     Cueing  General Comments   Cueing Techniques: Verbal cues      Exercises     Shoulder Instructions      Home Living Family/patient expects to be discharged to:: Skilled nursing facility  Additional Comments: lives at [facility]      Prior Functioning/Environment Prior Level of Function : Needs assist             Mobility Comments: wheelchair for mobility, hoyer lift for transfers, unable to propel ADLs Comments: total A from SNF staff    OT Problem List:     OT Treatment/Interventions:        OT Goals(Current goals can be found in the care plan section)       OT Frequency:       Co-evaluation              AM-PAC OT 6 Clicks Daily Activity     Outcome Measure Help from another person eating meals?: A Lot Help from another person taking care of personal grooming?: A Lot Help from another person toileting, which includes using toliet, bedpan, or urinal?: Total Help from another person bathing (including washing, rinsing, drying)?: Total Help from another person to put on and taking off regular upper body clothing?: Total Help from another person to put on and taking off regular lower body clothing?: Total 6 Click Score: 8   End of Session Equipment Utilized During Treatment: Oxygen  Activity  Tolerance: Patient tolerated treatment well Patient left: in bed;with call bell/phone within reach;with bed alarm set;with family/visitor present  OT Visit Diagnosis: Muscle weakness (generalized) (M62.81)                Time: 8545-8472 OT Time Calculation (min): 33 min Charges:  OT General Charges $OT Visit: 1 Visit OT Evaluation $OT Eval Low Complexity: 1 Low OT Treatments $Self Care/Home Management : 23-37 mins  Rogers Clause, OT/L MSOT, 09/01/2024

## 2024-09-01 NOTE — Sepsis Progress Note (Signed)
eLink is following this Code Sepsis.   Notified provider of need to order blood cultures.

## 2024-09-01 NOTE — ED Notes (Signed)
Called respiratory to set up BIPAP

## 2024-09-01 NOTE — Consult Note (Signed)
 CODE SEPSIS - PHARMACY COMMUNICATION  **Broad Spectrum Antibiotics should be administered within 1 hour of Sepsis diagnosis**  Time Code Sepsis Called/Page Received: 1007  Antibiotics Ordered: cefepime  and azithromycin   Time of 1st antibiotic administration: 1110  Additional action taken by pharmacy: Messaged nurse   If necessary, Name of Provider/Nurse Contacted: Maegan Narciso Kayla JULIANNA Niels ,PharmD Clinical Pharmacist  09/01/2024  10:08 AM

## 2024-09-01 NOTE — H&P (Signed)
 History and Physical    Michele House FMW:[id number] DOB: December 13, 1955 DOA: 09/01/2024  PCP: [facility] (Confirm with patient/family/NH records and if not entered, this has to be entered at [facility] point of entry) Patient coming from: Home  I have personally briefly reviewed patient's old medical records in [facility]  Chief Complaint: Cough, wheezing, shortness of breath  HPI: Michele House is a 68 y.o. female with medical history significant of COPD Gold stage III status post chemo and radiation therapy, on maintenance immune therapy, chronic hypoxic respiratory failure on 2 L continuously, RUL stage III non-small cell lung cancer status post chronic HFrEF with LVEF 30% July 2025, HTN, HLD, IDDM, hypothyroidism, diabetic neuropathy, anxiety/depression, presented with worsening of cough wheezing shortness of breath.  Symptoms started 2 to 3 days ago, patient started develop a dry cough and wheezing and increasing exertional dyspnea.  She told me that she weighs herself couple times a week and found her weight has been stable and there is no ankle edema either.  Started to develop orthopnea could not sleep and decided to come to ED.  Denied any fever chills no chest pains.  ED Course: Afebrile, not tachycardia, positive for tachypneic O2 saturation 88% on 2 L, chest x-ray showed pulmonary congestion and patient was started on BiPAP, VBG showed 7.40 6/88/45, blood work showed bicarb 39 BUN 19 creatinine 0.6 glucose 191 WBCs 2.5, hemoglobin 9.9.  Patient was given IV Solu-Medrol  and DuoNeb in the ED.  Review of Systems: As per HPI otherwise 14 point review of systems negative.    Past Medical History:  Diagnosis Date   Acid reflux    COPD (chronic obstructive pulmonary disease) (HCC)    Diabetes mellitus without complication (HCC)    Hyperlipidemia    Hypertension    Thyroid  disease     Past Surgical History:  Procedure Laterality Date   appendectomy      APPENDECTOMY     BREAST BIOPSY Right    CORE W/CLIP - NEG   ESOPHAGOGASTRODUODENOSCOPY N/A 10/28/2023   Procedure: ESOPHAGOGASTRODUODENOSCOPY (EGD);  Surgeon: Jinny Carmine, MD;  Location: [facility] ENDOSCOPY;  Service: Endoscopy;  Laterality: N/A;   FLEXIBLE BRONCHOSCOPY Bilateral 11/12/2023   Procedure: FLEXIBLE BRONCHOSCOPY;  Surgeon: Parris Manna, MD;  Location: [facility] ORS;  Service: Thoracic;  Laterality: Bilateral;   IR EMBO ART  VEN HEMORR LYMPH EXTRAV  INC GUIDE ROADMAPPING  10/30/2023   IR IMAGING GUIDED PORT INSERTION  01/18/2024   IVC FILTER INSERTION N/A 11/09/2023   Procedure: IVC FILTER INSERTION;  Surgeon: Marea Selinda RAMAN, MD;  Location: [facility] INVASIVE CV LAB;  Service: Cardiovascular;  Laterality: N/A;   TOTAL VAGINAL HYSTERECTOMY     TUMOR REMOVAL     benign;stomach   VIDEO BRONCHOSCOPY WITH ENDOBRONCHIAL NAVIGATION Right 10/20/2023   Procedure: VIDEO BRONCHOSCOPY WITH ENDOBRONCHIAL NAVIGATION;  Surgeon: Parris Manna, MD;  Location: [facility] ORS;  Service: Thoracic;  Laterality: Right;   VIDEO BRONCHOSCOPY WITH ENDOBRONCHIAL NAVIGATION Right 11/12/2023   Procedure: VIDEO BRONCHOSCOPY WITH ENDOBRONCHIAL NAVIGATION;  Surgeon: Parris Manna, MD;  Location: [facility] ORS;  Service: Thoracic;  Laterality: Right;     reports that she quit smoking about 38 years ago. Her smoking use included cigarettes. She has never used smokeless tobacco. She reports that she does not drink alcohol and does not use drugs.  Allergies  Allergen Reactions   Penicillins Hives   Aspirin  Other (See Comments)    Lips numb   Sulfa Antibiotics  Family History  Problem Relation Age of Onset   Heart attack Mother    Hypertension Mother    Breast cancer Sister 54   Breast cancer Maternal Aunt        40'S   Breast cancer Maternal Grandmother      Prior to Admission medications   Medication Sig Start Date End Date Taking? Authorizing Provider  acetaminophen  (TYLENOL ) 325 MG tablet Take 650 mg by mouth  every 6 (six) hours as needed.    [provider]  aspirin  EC 81 MG tablet Take 81 mg by mouth daily.    [provider]  atorvastatin  (LIPITOR) 20 MG tablet Take 20 mg by mouth daily. 06/25/23   [provider]  busPIRone (BUSPAR) 15 MG tablet Take 15 mg by mouth 3 (three) times daily.    [provider]  calcium  carbonate (TUMS - DOSED IN MG ELEMENTAL CALCIUM ) 500 MG chewable tablet Chew 1 tablet by mouth daily.    [provider]  cetirizine (ZYRTEC) 10 MG tablet Take 10 mg by mouth daily.    [provider]  COMBIVENT  RESPIMAT 20-100 MCG/ACT AERS respimat Inhale into the lungs every 6 (six) hours as needed for wheezing or shortness of breath. 09/06/23   [provider]  diclofenac sodium (VOLTAREN) 1 % GEL Apply topically 4 (four) times daily.    [provider]  docusate sodium  (COLACE) 100 MG capsule Take 1 capsule (100 mg total) by mouth 2 (two) times daily. 12/02/23   Awanda City, MD  doxycycline (MONODOX) 100 MG capsule Take 100 mg by mouth.    [provider]  DULERA 200-5 MCG/ACT AERO Inhale 2 puffs into the lungs every 12 (twelve) hours. 09/06/23   [provider]  furosemide  (LASIX ) 20 MG tablet Take 1 tablet (20 mg total) by mouth daily. 09/27/23   Shona Terry SAILOR, DO  glipiZIDE  (GLUCOTROL ) 10 MG tablet Take 10 mg by mouth daily before breakfast.    [provider]  hydrOXYzine  (ATARAX ) 25 MG tablet Take 25 mg by mouth 3 (three) times daily as needed.    [provider]  insulin  aspart (NOVOLOG ) 100 UNIT/ML injection Inject 4 Units into the skin 3 (three) times daily with meals. 12/02/23   Awanda City, MD  insulin  glargine-yfgn (SEMGLEE ) 100 UNIT/ML injection Inject 0.18 mLs (18 Units total) into the skin daily. 12/03/23   Awanda City, MD  ipratropium (ATROVENT HFA) 17 MCG/ACT inhaler Inhale 2 puffs into the lungs every 6 (six) hours.    [provider]  levETIRAcetam  (KEPPRA ) 500 MG  tablet Take 1 tablet (500 mg total) by mouth 2 (two) times daily. 12/02/23   Awanda City, MD  levothyroxine  (SYNTHROID ) 150 MCG tablet Take 1 tablet (150 mcg total) by mouth daily before breakfast. Increased from 100. 12/02/23   Awanda City, MD  lisinopril -hydrochlorothiazide  (PRINZIDE ,ZESTORETIC ) 20-25 MG per tablet Take 1 tablet by mouth daily.    [provider]  loperamide (IMODIUM A-D) 2 MG tablet Take 2 mg by mouth 4 (four) times daily as needed for diarrhea or loose stools.    [provider]  LORazepam  (ATIVAN ) 0.5 MG tablet Take 0.5 mg by mouth every 8 (eight) hours as needed for anxiety.    [provider]  Multiple Vitamin (MULTIVITAMIN) capsule Take 1 capsule by mouth daily.    [provider]  naloxone [facility]) nasal spray 4 mg/0.1 mL Place 1 spray into the nose.    [provider]  ondansetron  (  ZOFRAN ) 4 MG/5ML solution Take by mouth once.    [provider]  pantoprazole  (PROTONIX ) 40 MG tablet Take 1 tablet (40 mg total) by mouth 2 (two) times daily before a meal. 12/02/23   Awanda City, MD  polyethylene glycol (MIRALAX  / GLYCOLAX ) 17 g packet Take 17 g by mouth daily. 12/03/23   Awanda City, MD  predniSONE  (DELTASONE ) 5 MG tablet Take 5 mg by mouth.    [provider]  pregabalin  (LYRICA ) 50 MG capsule Take 50 mg by mouth 2 (two) times daily.    [provider]  rOPINIRole  (REQUIP  XL) 4 MG 24 hr tablet Take 1 tablet (4 mg total) by mouth at bedtime. 12/02/23   Awanda City, MD  senna (SENOKOT) 8.6 MG TABS tablet Take 1 tablet (8.6 mg total) by mouth in the morning and at bedtime. 03/03/24   Agrawal, Kavita, MD  sertraline  (ZOLOFT ) 100 MG tablet Take 100 mg by mouth daily. 06/28/23   [provider]  TRADJENTA  5 MG TABS tablet Take 5 mg by mouth daily. 07/26/23   [provider]    Physical Exam: Vitals:   09/01/24 9074 09/01/24 0927 09/01/24 0928 09/01/24 0934  BP:   125/72   Pulse:   97   Resp:   (!) 26    Temp:   97.9 F (36.6 C)   TempSrc:   Oral   SpO2: (!) 89% (!) 89% 92%   Weight:    63.2 kg  Height:    5' 1 (1.549 m)    Constitutional: NAD, calm, comfortable Vitals:   09/01/24 0925 09/01/24 0927 09/01/24 0928 09/01/24 0934  BP:   125/72   Pulse:   97   Resp:   (!) 26   Temp:   97.9 F (36.6 C)   TempSrc:   Oral   SpO2: (!) 89% (!) 89% 92%   Weight:    63.2 kg  Height:    5' 1 (1.549 m)   Eyes: PERRL, lids and conjunctivae normal ENMT: Mucous membranes are moist. Posterior pharynx clear of any exudate or lesions.Normal dentition.  Neck: normal, supple, no masses, no thyromegaly Respiratory: Diminished breathing sound bilaterally, scattered wheezing, scattered bilateral lower field crackles, increasing breathing effort. No accessory muscle use.  Cardiovascular: Regular rate and rhythm, no murmurs / rubs / gallops.  Trace extremity edema. 2+ pedal pulses. No carotid bruits.  Abdomen: no tenderness, no masses palpated. No hepatosplenomegaly. Bowel sounds positive.  Musculoskeletal: no clubbing / cyanosis. No joint deformity upper and lower extremities. Good ROM, no contractures. Normal muscle tone.  Skin: no rashes, lesions, ulcers. No induration or Neurologic: CN 2-12 grossly intact. Sensation intact, DTR normal. Strength 5/5 in all 4.  Psychiatric: Normal judgment and insight. Alert and oriented x 3. Normal mood.     Labs on Admission: I have personally reviewed following labs and imaging studies  CBC: Recent Labs  Lab 09/01/24 0943  WBC 3.5*  NEUTROABS 2.9  HGB 9.9*  HCT 33.9*  MCV 85.6  PLT 197   Basic Metabolic Panel: Recent Labs  Lab 09/01/24 0943  NA 146*  K 3.7  CL 93*  CO2 39*  GLUCOSE 191*  BUN 19  CREATININE 0.67  CALCIUM  11.1*   GFR: Estimated Creatinine Clearance: 55.8 mL/min (by C-G formula based on SCr of 0.67 mg/dL). Liver Function Tests: Recent Labs  Lab 09/01/24 0943  AST 15  ALT 8  ALKPHOS 82  BILITOT 0.4  PROT 5.6*   ALBUMIN 2.7*  No results for input(s): LIPASE, AMYLASE in the last 168 hours. No results for input(s): AMMONIA in the last 168 hours. Coagulation Profile: No results for input(s): INR, PROTIME in the last 168 hours. Cardiac Enzymes: No results for input(s): CKTOTAL, CKMB, CKMBINDEX, TROPONINI in the last 168 hours. BNP (last 3 results) No results for input(s): PROBNP in the last 8760 hours. HbA1C: No results for input(s): HGBA1C in the last 72 hours. CBG: No results for input(s): GLUCAP in the last 168 hours. Lipid Profile: No results for input(s): CHOL, HDL, LDLCALC, TRIG, CHOLHDL, LDLDIRECT in the last 72 hours. Thyroid  Function Tests: No results for input(s): TSH, T4TOTAL, FREET4, T3FREE, THYROIDAB in the last 72 hours. Anemia Panel: No results for input(s): VITAMINB12, FOLATE, FERRITIN, TIBC, IRON , RETICCTPCT in the last 72 hours. Urine analysis:    Component Value Date/Time   COLORURINE YELLOW (A) 10/02/2023 0458   APPEARANCEUR TURBID (A) 10/02/2023 0458   APPEARANCEUR Cloudy 08/31/2014 1528   LABSPEC 1.019 10/02/2023 0458   LABSPEC 1.015 08/31/2014 1528   PHURINE 6.0 10/02/2023 0458   GLUCOSEU 50 (A) 10/02/2023 0458   GLUCOSEU Negative 08/31/2014 1528   HGBUR NEGATIVE 10/02/2023 0458   BILIRUBINUR NEGATIVE 10/02/2023 0458   BILIRUBINUR Negative 08/31/2014 1528   KETONESUR 5 (A) 10/02/2023 0458   PROTEINUR 30 (A) 10/02/2023 0458   NITRITE POSITIVE (A) 10/02/2023 0458   LEUKOCYTESUR SMALL (A) 10/02/2023 0458   LEUKOCYTESUR 3+ 08/31/2014 1528    Radiological Exams on Admission: DG Chest Portable 1 View Result Date: 09/01/2024 EXAM: 1 VIEW(S) XRAY OF THE CHEST 09/01/2024 09:52:00 AM COMPARISON: 08/08/2024 CLINICAL HISTORY: hypoxia FINDINGS: LINES, TUBES AND DEVICES: Stable left internal jugular port-a-cath. LUNGS AND PLEURA: Stable bilateral diffuse interstitial densities are noted concerning for edema or  inflammation. Right upper lobe opacity is again noted concerning for neoplasm or possible pneumonia. Minimal pleural effusions may be present. No pneumothorax. HEART AND MEDIASTINUM: Stable cardiomediastinal silhouette. BONES AND SOFT TISSUES: No acute osseous abnormality. IMPRESSION: 1. Stable bilateral diffuse interstitial densities, suggestive of edema or inflammation. 2. Stable right upper lobe opacity, suspicious for neoplasm or pneumonia. 3. Minimal pleural effusions. Electronically signed by: Lynwood Seip MD 09/01/2024 10:26 AM EDT RP Workstation: [id number]    EKG: Independently reviewed.  Sinus rhythm, chronic RBBB, no acute ST changes.  Assessment/Plan Principal Problem:   COPD (chronic obstructive pulmonary disease) (HCC)  (please populate well all problems here in Problem List. (For example, if patient is on BP meds at home and you resume or decide to hold them, it is a problem that needs to be her. Same for CAD, COPD, HLD and so on)  Acute on chronic hypoxic and hypercapnic respiratory failure COPD Gold stage III Acute COPD exacerbation - Continue BiPAP support  - Admit to PCU - IV Solu-Medrol  bridging for prednisone  -Doxycycline - ICS and LABA - DuoNebs and as needed albuterol  - Incentive spirometry - Check respiratory viral panel - Other DDx, most of her symptoms can be attributed to either COPD or CHF decompensation, low suspicion for PE, hold off further imaging study.  Acute on chronic HFrEF decompensation - 1 dose of IV Lasix , repeat chest x-ray - Resume p.o. Lasix  tomorrow unless volume status not improving - Echocardiogram was done less than 3 months ago, will not repeat echo at this time  History of right leg DVT, status post IVC filter - Happened January of this year, IVC filter placed instead of anticoagulation due to concurrent severe GI bleed.    History of duodenal  ulcer and upper GI bleed  -During same hospitalization in January, patient also developed  severe duodenum bleeding, subsequently underwent EGD and clipping but clipping failed and patient received IR embolization 2 days later. - H&H appears to be stable, check iron  study.  Seizure disorder - Continue Keppra   IDDM - Continue Lantus  - Continue SSI  Anxiety/depression - Continue SSRI - Decrease dosage of as needed Ativan  given to her breathing status  DVT prophylaxis: Lovenox  subcu Code Status: Full code Family Communication: None at bedside Disposition Plan: Patient is sick with COPD exacerbation as well as CHF decompensation requiring BiPAP support IV diuresis and IV steroid, expect more than 2 midnight hospital stay Consults called: None Admission status: PCU admit   Cort ONEIDA Mana MD [facility] Pager [phone]  09/01/2024, 11:03 AM

## 2024-09-01 NOTE — ED Notes (Signed)
 Called CCMD to add pt to monitoring.

## 2024-09-01 NOTE — Progress Notes (Signed)
 PT Cancellation Note  Patient Details Name: Michele House MRN: [id number] DOB: 1956/08/15   Cancelled Treatment:    Reason Eval/Treat Not Completed: PT screened, no needs identified, will sign off (Met with patient at bedside. Pt reports living in LTC now in [place]. Pt is non ambulatory at baseline, typically can self feed. Pt uses hoyer transfes to WC at facilitty, unable to self propel. Pt presents at her baseline, no acute PT services indicated at this time. Will sign off.   2:22 PM, 09/01/24 Peggye JAYSON Linear, PT, DPT Physical Therapist - [facility]  [phone] (ASCOM)     Jaylyn Iyer C 09/01/2024, 2:21 PM

## 2024-09-01 NOTE — ED Notes (Signed)
 PT at bedside. Pt being trailed off of bipap while PT in the rm. Pt in NAD. Sating at 98 on 4L of O2. RR even.

## 2024-09-01 NOTE — ED Notes (Signed)
 2 SST's sent to lab

## 2024-09-01 NOTE — ED Triage Notes (Signed)
 Pt arrives from [facility] via [facility] with c/o SOB that started yesterday. Per EMS, pt's O2 levels dropped into the 30s yesterday and pt was put on 5L, pt's baseline is 2L, pt has a hx of respiratory failure, lung cancer, and COPD. Pt denies CP. Pt on 5L currently with O2 91%. Pt A&Ox3.

## 2024-09-01 NOTE — ED Provider Notes (Addendum)
 [facility] Provider Note    Event Date/Time   First MD Initiated Contact with Patient 09/01/24 [phone]     (approximate)   History   Shortness of Breath   HPI  Michele House is a 68 y.o. female past medical history significant for non-small cell carcinoma, DVT with IVC filter, HFrEF with an EF of 30%, history of asthma, history of massive GI bleed with clipping to duodenal ulcer and IR embolization, history of COPD, presents to the emergency department shortness of breath.  Patient normally wears 2 L of home oxygen.  Yesterday had significantly worsening oxygen requirement with shortness of breath, cough.  EMS reported that had 30% of oxygen reading yesterday at [facility] and they increased her O2 up to 5 L.  Patient states that she has shortness of breath.  States she does not feel well with significant cough.  Would want BiPAP if needed.  She wants to talk with her daughter before deciding if she would want to be intubated or not.  Is DNR.     Physical Exam   Triage Vital Signs: ED Triage Vitals [09/01/24 0925]  Encounter Vitals Group     BP      Girls Systolic BP Percentile      Girls Diastolic BP Percentile      Boys Systolic BP Percentile      Boys Diastolic BP Percentile      Pulse      Resp      Temp      Temp src      SpO2 (!) 89 %     Weight      Height      Head Circumference      [facility]      Pain Score      Pain Loc      Pain Education      Exclude from Growth Chart     Most recent vital signs: Vitals:   09/01/24 0927 09/01/24 0928  BP:  125/72  Pulse:  97  Resp:  (!) 26  Temp:  97.9 F (36.6 C)  SpO2: (!) 89% 92%    Physical Exam Constitutional:      Appearance: She is well-developed.  HENT:     Head: Atraumatic.  Eyes:     Conjunctiva/sclera: Conjunctivae normal.  Cardiovascular:     Rate and Rhythm: Regular rhythm.  Pulmonary:     Effort: Tachypnea, accessory muscle usage and respiratory distress  present.     Breath sounds: Wheezing and rhonchi present.  Abdominal:     General: There is no distension.  Musculoskeletal:        General: Normal range of motion.     Cervical back: Normal range of motion.     Right lower leg: Edema present.     Left lower leg: Edema present.     Comments: Pitting edema to bilateral lower extremities  Skin:    General: Skin is warm.     Capillary Refill: Capillary refill takes less than 2 seconds.  Neurological:     Mental Status: She is alert. Mental status is at baseline.     IMPRESSION / MDM / ASSESSMENT AND PLAN / ED COURSE  I reviewed the triage vital signs and the nursing notes.  Differential diagnosis including COPD exacerbation, pneumonia, anemia, GI bleed, pulmonary embolism, pneumothorax, viral illness including COVID/influenza   On arrival patient was significant respiratory distress, 89% on 5 L nasal cannula  which is increased from her baseline of 2.  Started on DuoNeb treatments and given IV Solu-Medrol   Afebrile, tachypneic and hypoxic  Blood cultures obtained.  Concern for possible COPD exacerbation versus pneumonia.  Will obtain a lactic acid.  COVID and influenza testing obtained.  EKG  I, Clotilda Punter, the attending physician, personally viewed and interpreted this ECG.  EKG showed normal sinus rhythm.  Prolonged PR interval.  Signs of right bundle branch block.  No significant ST elevation or depression.  No significant change when compared to prior EKG.  No findings of acute ischemia   No tachycardic or bradycardic dysrhythmias while on cardiac telemetry.  RADIOLOGY I independently reviewed imaging, my interpretation of imaging: Chest x-ray my evaluation concerning for possible worsening pneumonia on the right side however it was present in the past.  Chest x-ray was read as stable bilateral interstitial densities suggesting edema versus inflammation.  Stable right upper lobe opacity concerning for neoplasm or  pneumonia.  Minimal pleural effusions.  LABS (all labs ordered are listed, but only abnormal results are displayed) Labs interpreted as -    Labs Reviewed  CBC WITH DIFFERENTIAL/PLATELET - Abnormal; Notable for the following components:      Result Value   WBC 3.5 (*)    Hemoglobin 9.9 (*)    HCT 33.9 (*)    MCH 25.0 (*)    MCHC 29.2 (*)    RDW 16.3 (*)    Lymphs Abs 0.2 (*)    All other components within normal limits  COMPREHENSIVE METABOLIC PANEL WITH GFR - Abnormal; Notable for the following components:   Sodium 146 (*)    Chloride 93 (*)    CO2 39 (*)    Glucose, Bld 191 (*)    Calcium  11.1 (*)    Total Protein 5.6 (*)    Albumin 2.7 (*)    All other components within normal limits  BLOOD GAS, VENOUS - Abnormal; Notable for the following components:   pCO2, Ven 88 (*)    Bicarbonate 49.7 (*)    Acid-Base Excess 19.3 (*)    All other components within normal limits  LACTIC ACID, PLASMA - Abnormal; Notable for the following components:   Lactic Acid, Venous 2.8 (*)    All other components within normal limits  RESP PANEL BY RT-PCR (RSV, FLU A&B, COVID)  RVPGX2  CULTURE, BLOOD (ROUTINE X 2)  CULTURE, BLOOD (ROUTINE X 2)  LACTIC ACID, PLASMA  BRAIN NATRIURETIC PEPTIDE  TROPONIN I (HIGH SENSITIVITY)     MDM  Patient has a history of heart failure felt that 30 cc/kg of IV fluids may be detrimental to the patient.  Has findings concerning for possible heart failure on exam.  Will treat with IV Lasix .  Concern for significant acute hypoxic hypercarbic respiratory failure in the setting of COPD exacerbation and possible progression of pneumonia.  Leukopenia with an elevated lactic acid.  Significant hypercarbia however it does appear compensated patient is worsening hypercarbic when compared to prior.  Will do a trial of BiPAP.  Given DuoNebs and IV Solu-Medrol .  Started on antibiotics to cover for Pseudomonas risk factors.  Blood cultures added on and started on  broad-spectrum antibiotics.  Consulted hospitalist for admission for acute hypoxic hypercarbic respiratory failure.  Patient most likely with COPD and pneumonia, possible progression of neoplasm.     PROCEDURES:  Critical Care performed: yes  .Critical Care  Performed by: Punter Clotilda, MD Authorized by: Punter Clotilda, MD   Critical care provider  statement:    Critical care time (minutes):  40   Critical care time was exclusive of:  Separately billable procedures and treating other patients   Critical care was necessary to treat or prevent imminent or life-threatening deterioration of the following conditions:  Respiratory failure   Critical care was time spent personally by me on the following activities:  Development of treatment plan with patient or surrogate, discussions with consultants, evaluation of patient's response to treatment, examination of patient, ordering and review of laboratory studies, ordering and review of radiographic studies, ordering and performing treatments and interventions, pulse oximetry, re-evaluation of patient's condition and review of old charts   Care discussed with: admitting provider     Patient's presentation is most consistent with acute presentation with potential threat to life or bodily function.   MEDICATIONS ORDERED IN ED: Medications  ceFEPIme  (MAXIPIME ) 2 g in sodium chloride  0.9 % 100 mL IVPB (has no administration in time range)  azithromycin  (ZITHROMAX ) 500 mg in sodium chloride  0.9 % 250 mL IVPB (has no administration in time range)  ipratropium-albuterol  (DUONEB) 0.5-2.5 (3) MG/3ML nebulizer solution 9 mL (9 mLs Nebulization Given 09/01/24 0947)  methylPREDNISolone  sodium succinate (SOLU-MEDROL ) 125 mg/2 mL injection 125 mg (125 mg Intravenous Given 09/01/24 0949)    FINAL CLINICAL IMPRESSION(S) / ED DIAGNOSES   Final diagnoses:  COPD exacerbation (HCC)  Acute respiratory failure with hypoxia and hypercarbia (HCC)     Rx /  DC Orders   ED Discharge Orders     None        Note:  This document was prepared using Dragon voice recognition software and may include unintentional dictation errors.   Suzanne Kirsch, MD 09/01/24 1032    Suzanne Kirsch, MD 09/01/24 [phone]

## 2024-09-02 ENCOUNTER — Inpatient Hospital Stay

## 2024-09-02 DIAGNOSIS — J9601 Acute respiratory failure with hypoxia: Secondary | ICD-10-CM | POA: Diagnosis not present

## 2024-09-02 DIAGNOSIS — L8990 Pressure ulcer of unspecified site, unspecified stage: Secondary | ICD-10-CM | POA: Insufficient documentation

## 2024-09-02 DIAGNOSIS — J9602 Acute respiratory failure with hypercapnia: Secondary | ICD-10-CM

## 2024-09-02 DIAGNOSIS — J441 Chronic obstructive pulmonary disease with (acute) exacerbation: Secondary | ICD-10-CM | POA: Diagnosis not present

## 2024-09-02 LAB — BASIC METABOLIC PANEL WITH GFR
Anion gap: 9 (ref 5–15)
BUN: 23 mg/dL (ref 8–23)
CO2: 40 mmol/L — ABNORMAL HIGH (ref 22–32)
Calcium: 11.2 mg/dL — ABNORMAL HIGH (ref 8.9–10.3)
Chloride: 95 mmol/L — ABNORMAL LOW (ref 98–111)
Creatinine, Ser: 0.7 mg/dL (ref 0.44–1.00)
GFR, Estimated: >60 mL/min (ref >60)
Glucose, Bld: 181 mg/dL — ABNORMAL HIGH (ref 70–99)
Potassium: 3.9 mmol/L (ref 3.5–5.1)
Sodium: 144 mmol/L (ref 135–145)

## 2024-09-02 LAB — GLUCOSE, CAPILLARY
Glucose-Capillary: 169 mg/dL — ABNORMAL HIGH (ref 70–99)
Glucose-Capillary: 220 mg/dL — ABNORMAL HIGH (ref 70–99)
Glucose-Capillary: 251 mg/dL — ABNORMAL HIGH (ref 70–99)
Glucose-Capillary: 170 mg/dL — ABNORMAL HIGH (ref 70–99)

## 2024-09-02 LAB — HIV ANTIBODY (ROUTINE TESTING W REFLEX): HIV Screen 4th Generation wRfx: NONREACTIVE

## 2024-09-02 MED ORDER — IPRATROPIUM-ALBUTEROL 0.5-2.5 (3) MG/3ML IN SOLN
3.0000 mL | Freq: Three times a day (TID) | RESPIRATORY_TRACT | Status: DC
  Administered 2024-09-03 – 2024-09-04 (×5): 3 mL via RESPIRATORY_TRACT
  Filled 2024-09-02 (×6): qty 3

## 2024-09-02 MED ORDER — ADULT MULTIVITAMIN W/MINERALS CH
1.0000 | ORAL_TABLET | Freq: Every day | ORAL | Status: DC
  Administered 2024-09-02 – 2024-09-04 (×3): 1 via ORAL
  Filled 2024-09-02 (×3): qty 1

## 2024-09-02 MED ORDER — JUVEN PO PACK
1.0000 | PACK | Freq: Two times a day (BID) | ORAL | Status: DC
  Administered 2024-09-03 – 2024-09-04 (×4): 1 via ORAL

## 2024-09-02 MED ORDER — PROSOURCE PLUS PO LIQD
30.0000 mL | Freq: Two times a day (BID) | ORAL | Status: DC
  Administered 2024-09-03 – 2024-09-04 (×4): 30 mL via ORAL

## 2024-09-02 MED ORDER — GLUCERNA SHAKE PO LIQD
237.0000 mL | Freq: Two times a day (BID) | ORAL | Status: DC
  Administered 2024-09-03 – 2024-09-04 (×4): 237 mL via ORAL

## 2024-09-02 NOTE — Progress Notes (Signed)
 [facility]  - [place] at [facility]   PATIENT NAME: Michele House    MR#:  [id number]  DATE OF BIRTH:  01/09/1956  SUBJECTIVE:  Came in with increasing sob Feels better Wears oxygen chronically   VITALS:  Blood pressure 117/60, pulse 93, temperature 98.4 F (36.9 C), resp. rate 16, height 5' 1 (1.549 m), weight 60.2 kg, last menstrual period 12/24/1998, SpO2 99%.  PHYSICAL EXAMINATION:   GENERAL:  68 y.o.-year-old patient with no acute distress.  LUNGS: decreased breath sounds bilaterally, no wheezing CARDIOVASCULAR: S1, S2 normal. No murmur   ABDOMEN: Soft, nontender, nondistended. Bowel sounds present.  EXTREMITIES: No  edema b/l.    NEUROLOGIC: nonfocal  patient is alert and awake  LABORATORY PANEL:  CBC Recent Labs  Lab 09/01/24 0943  WBC 3.5*  HGB 9.9*  HCT 33.9*  PLT 197    Chemistries  Recent Labs  Lab 09/01/24 0943 09/02/24 0630  NA 146* 144  K 3.7 3.9  CL 93* 95*  CO2 39* 40*  GLUCOSE 191* 181*  BUN 19 23  CREATININE 0.67 0.70  CALCIUM  11.1* 11.2*  AST 15  --   ALT 8  --   ALKPHOS 82  --   BILITOT 0.4  --    Cardiac Enzymes No results for input(s): TROPONINI in the last 168 hours. RADIOLOGY:  DG Chest 1 View Result Date: 09/02/2024 CLINICAL DATA:  Congestive heart failure. EXAM: CHEST  1 VIEW COMPARISON:  09/01/2024 FINDINGS: Stable mild cardiomegaly. Left-sided power port remains in appropriate position. Diffuse pulmonary interstitial infiltrates show no significant change. Poorly defined airspace opacity in the right upper lobe also shows no significant change. No pneumothorax or pleural effusion identified. IMPRESSION: No significant change in diffuse bilateral interstitial infiltrates and right upper lobe airspace opacity. Electronically Signed   By: Michele House M.D.   On: 09/02/2024 06:25   DG Chest Portable 1 View Result Date: 09/01/2024 EXAM: 1 VIEW(S) XRAY OF THE CHEST 09/01/2024 09:52:00 AM COMPARISON: 08/08/2024  CLINICAL HISTORY: hypoxia FINDINGS: LINES, TUBES AND DEVICES: Stable left internal jugular port-a-cath. LUNGS AND PLEURA: Stable bilateral diffuse interstitial densities are noted concerning for edema or inflammation. Right upper lobe opacity is again noted concerning for neoplasm or possible pneumonia. Minimal pleural effusions may be present. No pneumothorax. HEART AND MEDIASTINUM: Stable cardiomediastinal silhouette. BONES AND SOFT TISSUES: No acute osseous abnormality. IMPRESSION: 1. Stable bilateral diffuse interstitial densities, suggestive of edema or inflammation. 2. Stable right upper lobe opacity, suspicious for neoplasm or pneumonia. 3. Minimal pleural effusions. Electronically signed by: Lynwood Seip MD 09/01/2024 10:26 AM EDT RP Workstation: [id number]    Assessment and Plan  Michele House is a 68 y.o. female with medical history significant of COPD Gold stage III status post chemo and radiation therapy, on maintenance immune therapy, chronic hypoxic respiratory failure on 2 L continuously, RUL stage III non-small cell lung cancer status post chronic HFrEF with LVEF 30% July 2025, HTN, HLD, IDDM, hypothyroidism, diabetic neuropathy, anxiety/depression, presented with worsening of cough wheezing shortness of breath.   Acute on chronic hypoxic and hypercapnic respiratory failure COPD Gold stage III Acute COPD exacerbation - Continue BiPAP support  - IV Solu-Medrol  bridging for prednisone  -Doxycycline empiric - ICS and LABA - DuoNebs and as needed albuterol  - Incentive spirometry - respiratory viral panel--negaitve -pt feels better today--back to near baseline   Acute on chronic HFrEF decompensation - 1 dose of IV Lasix , repeat chest x-ray - Resume p.o. Lasix  tomorrow unless volume status  not improving - Echocardiogram was done less than 3 months ago, will not repeat echo at this time   History of right leg DVT, status post IVC filter - Happened January of this year, IVC filter  placed instead of anticoagulation due to concurrent severe GI bleed.     History of duodenal ulcer and upper GI bleed  -During same hospitalization in January, patient also developed severe duodenum bleeding, subsequently underwent EGD and clipping but clipping failed and patient received IR embolization 2 days later. - H&H appears to be stable, check iron  study.   Seizure disorder - Continue Keppra    IDDM - Continue Lantus  - Continue SSI  Procedures: Family communication :none Consults :none CODE STATUS: full DVT Prophylaxis :lovenox  Level of care: Progressive Status is: Inpatient Remains inpatient appropriate because: copd flare    TOTAL TIME TAKING CARE OF THIS PATIENT: 35 minutes.  >50% time spent on counselling and coordination of care  Note: This dictation was prepared with Dragon dictation along with smaller phrase technology. Any transcriptional errors that result from this process are unintentional.  Leita Blanch M.D    [facility]   CC: Primary care physician; Center, [facility]

## 2024-09-02 NOTE — Progress Notes (Signed)
 Initial Nutrition Assessment  DOCUMENTATION CODES:   Not applicable  INTERVENTION:   Juven BID, each packet provides 80 calories, 8 grams of carbohydrate, 2.5  grams of protein (collagen), 7 grams of L-arginine and 7 grams of L-glutamine; supplement contains CaHMB, Vitamins C, E, B12 and Zinc to promote wound healing Glucerna Shake po BID, each supplement provides 220 kcal and 10 grams of protein Prosource Plus 30 ml PO BID, each packet provides 100 kcal and 15 gm protein MVI with minerals daily  NUTRITION DIAGNOSIS:   Increased nutrient needs related to wound healing as evidenced by estimated needs.  GOAL:   Patient will meet greater than or equal to 90% of their needs  MONITOR:   PO intake, Supplement acceptance  REASON FOR ASSESSMENT:   Consult Assessment of nutrition requirement/status  ASSESSMENT:   68 yo female admitted with COPD exacerbation, CHF decompensation. PMH includes HTN, HLD, DM, reflux, COPD, thyroid  disease.  Weight history reviewed. Weights seem to fluctuate (likely r/t fluids w/ CHF). Weight has ranged from 67.1 kg-78 kg over the past 9 months. Current weight is 10% below her lowest weight from 3.5 months ago. Unable to determine actual dry weight loss.   Currently on a regular diet with 50% meal intake x 1 meal today.  Labs reviewed.  CBG: [phone]  Medications reviewed and include ferrous sulfate, lasix , novolog , semglee , synthroid , miralax , protonix , prednisone .   Unable to speak with patient or complete NFPE at this time. Patient would benefit from nutrition supplements to maximize protein intake, given stage 3 pressure injury.  NUTRITION - FOCUSED PHYSICAL EXAM:  Unable to complete  Diet Order:   Diet Order             Diet regular Fluid consistency: Thin  Diet effective now                   EDUCATION NEEDS:   Not appropriate for education at this time  Skin:  Skin Assessment: Skin Integrity Issues: Skin Integrity  Issues:: Stage III Stage III: coccyx  Last BM:  no BM documented  Height:   Ht Readings from Last 1 Encounters:  09/01/24 5' 1 (1.549 m)    Weight:   Wt Readings from Last 1 Encounters:  09/02/24 60.2 kg    Ideal Body Weight:  47.7 kg  BMI:  Body mass index is 25.08 kg/m.  Estimated Nutritional Needs:   Kcal:  1500-1800  Protein:  80-95 gm  Fluid:  1.5-1.8 L   Suzen HUNT RD, LDN, CNSC Contact via secure chat. If unavailable, use group chat RD Inpatient.

## 2024-09-02 NOTE — Plan of Care (Signed)

## 2024-09-03 DIAGNOSIS — J9602 Acute respiratory failure with hypercapnia: Secondary | ICD-10-CM | POA: Diagnosis not present

## 2024-09-03 DIAGNOSIS — J9601 Acute respiratory failure with hypoxia: Secondary | ICD-10-CM | POA: Diagnosis not present

## 2024-09-03 DIAGNOSIS — J441 Chronic obstructive pulmonary disease with (acute) exacerbation: Secondary | ICD-10-CM | POA: Diagnosis not present

## 2024-09-03 LAB — GLUCOSE, CAPILLARY
Glucose-Capillary: 100 mg/dL — ABNORMAL HIGH (ref 70–99)
Glucose-Capillary: 246 mg/dL — ABNORMAL HIGH (ref 70–99)
Glucose-Capillary: 350 mg/dL — ABNORMAL HIGH (ref 70–99)
Glucose-Capillary: 282 mg/dL — ABNORMAL HIGH (ref 70–99)

## 2024-09-03 MED ORDER — PHENOL 1.4 % MT LIQD
1.0000 | Freq: Every day | OROMUCOSAL | Status: DC | PRN
  Administered 2024-09-03 (×2): 1 via OROMUCOSAL
  Filled 2024-09-03: qty 177

## 2024-09-03 NOTE — Plan of Care (Signed)
  Problem: Clinical Measurements: Goal: Will remain free from infection Outcome: Progressing   Problem: Health Behavior/Discharge Planning: Goal: Ability to manage health-related needs will improve Outcome: Not Progressing   Problem: Skin Integrity: Goal: Risk for impaired skin integrity will decrease Outcome: Not Progressing   Problem: Education: Goal: Knowledge of General Education information will improve Description: Including pain rating scale, medication(s)/side effects and non-pharmacologic comfort measures Outcome: Not Progressing   Problem: Activity: Goal: Risk for activity intolerance will decrease Outcome: Not Progressing   Problem: Education: Goal: Knowledge of disease or condition will improve Outcome: Not Progressing   Problem: Respiratory: Goal: Ability to maintain a clear airway will improve Outcome: Not Progressing

## 2024-09-03 NOTE — Care Management Important Message (Signed)
 Important Message  Patient Details  Name: Michele House MRN: [id number] Date of Birth: 26-Nov-1955   Important Message Given:  Yes - Medicare IM     [name] 09/03/2024, 6:03 PM

## 2024-09-03 NOTE — Progress Notes (Signed)
 [facility]  - [place] at [facility]   PATIENT NAME: Michele House    MR#:  [id number]  DATE OF BIRTH:  1956-05-12  SUBJECTIVE:  Came in with increasing sob Feels better Wears oxygen chronically   VITALS:  Blood pressure (!) 116/52, pulse 83, temperature 97.6 F (36.4 C), resp. rate 18, height 5' 1 (1.549 m), weight (P) 93.1 kg, last menstrual period 12/24/1998, SpO2 98%.  PHYSICAL EXAMINATION:   GENERAL:  68 y.o.-year-old patient with no acute distress.  LUNGS: decreased breath sounds bilaterally, no wheezing CARDIOVASCULAR: S1, S2 normal. No murmur   ABDOMEN: Soft, nontender, nondistended. Bowel sounds present.  EXTREMITIES: No  edema b/l.    NEUROLOGIC: nonfocal  patient is alert and awake  LABORATORY PANEL:  CBC Recent Labs  Lab 09/01/24 0943  WBC 3.5*  HGB 9.9*  HCT 33.9*  PLT 197    Chemistries  Recent Labs  Lab 09/01/24 0943 09/02/24 0630  NA 146* 144  K 3.7 3.9  CL 93* 95*  CO2 39* 40*  GLUCOSE 191* 181*  BUN 19 23  CREATININE 0.67 0.70  CALCIUM  11.1* 11.2*  AST 15  --   ALT 8  --   ALKPHOS 82  --   BILITOT 0.4  --    Cardiac Enzymes No results for input(s): TROPONINI in the last 168 hours. RADIOLOGY:  DG Chest 1 View Result Date: 09/02/2024 CLINICAL DATA:  Congestive heart failure. EXAM: CHEST  1 VIEW COMPARISON:  09/01/2024 FINDINGS: Stable mild cardiomegaly. Left-sided power port remains in appropriate position. Diffuse pulmonary interstitial infiltrates show no significant change. Poorly defined airspace opacity in the right upper lobe also shows no significant change. No pneumothorax or pleural effusion identified. IMPRESSION: No significant change in diffuse bilateral interstitial infiltrates and right upper lobe airspace opacity. Electronically Signed   By: Norleen DELENA Kil M.D.   On: 09/02/2024 06:25    Assessment and Plan  Michele House is a 68 y.o. female with medical history significant of COPD Gold stage III  status post chemo and radiation therapy, on maintenance immune therapy, chronic hypoxic respiratory failure on 2 L continuously, RUL stage III non-small cell lung cancer status post chronic HFrEF with LVEF 30% July 2025, HTN, HLD, IDDM, hypothyroidism, diabetic neuropathy, anxiety/depression, presented with worsening of cough wheezing shortness of breath.   Acute on chronic hypoxic and hypercapnic respiratory failure COPD Gold stage III Acute COPD exacerbation - Continue BiPAP support  - IV Solu-Medrol  bridging for prednisone --change to po -Doxycycline empiric - ICS and LABA - DuoNebs and as needed albuterol  - Incentive spirometry - respiratory viral panel--negaitve -pt feels better today--back to near baseline   Acute on chronic HFrEF decompensation --resume home dose lasix  - Echocardiogram was done less than 3 months ago, will not repeat echo at this time   History of right leg DVT, status post IVC filter - Happened January of this year, IVC filter placed instead of anticoagulation due to concurrent severe GI bleed.     History of duodenal ulcer and upper GI bleed  -During same hospitalization in January, patient also developed severe duodenum bleeding, subsequently underwent EGD and clipping but clipping failed and patient received IR embolization 2 days later. - H&H appears to be stable   Seizure disorder - Continue Keppra    IDDM - Continue Lantus  - Continue SSI  Procedures: Family communication :dter [name] Consults :none CODE STATUS: full DVT Prophylaxis :lovenox  Level of care: Progressive Status is: Inpatient Remains inpatient appropriate because: copd flare  Pt overall improving. She can discharge back to facility tomorrow. TOC has been notified    TOTAL TIME TAKING CARE OF THIS PATIENT: 35 minutes.  >50% time spent on counselling and coordination of care  Note: This dictation was prepared with Dragon dictation along with smaller phrase technology. Any  transcriptional errors that result from this process are unintentional.  Leita Blanch M.D    [facility]   CC: Primary care physician; Center, [facility]

## 2024-09-03 NOTE — Plan of Care (Signed)
  Problem: Skin Integrity: Goal: Risk for impaired skin integrity will decrease Outcome: Progressing   Problem: Nutritional: Goal: Maintenance of adequate nutrition will improve Outcome: Progressing   Problem: Tissue Perfusion: Goal: Adequacy of tissue perfusion will improve Outcome: Progressing   Problem: Tissue Perfusion: Goal: Adequacy of tissue perfusion will improve Outcome: Progressing   Problem: Clinical Measurements: Goal: Ability to maintain clinical measurements within normal limits will improve Outcome: Progressing

## 2024-09-04 DIAGNOSIS — L8990 Pressure ulcer of unspecified site, unspecified stage: Secondary | ICD-10-CM

## 2024-09-04 DIAGNOSIS — E1169 Type 2 diabetes mellitus with other specified complication: Secondary | ICD-10-CM

## 2024-09-04 DIAGNOSIS — Z794 Long term (current) use of insulin: Secondary | ICD-10-CM

## 2024-09-04 DIAGNOSIS — J9601 Acute respiratory failure with hypoxia: Secondary | ICD-10-CM | POA: Diagnosis not present

## 2024-09-04 DIAGNOSIS — J441 Chronic obstructive pulmonary disease with (acute) exacerbation: Secondary | ICD-10-CM | POA: Diagnosis not present

## 2024-09-04 LAB — GLUCOSE, CAPILLARY
Glucose-Capillary: 101 mg/dL — ABNORMAL HIGH (ref 70–99)
Glucose-Capillary: 254 mg/dL — ABNORMAL HIGH (ref 70–99)
Glucose-Capillary: 308 mg/dL — ABNORMAL HIGH (ref 70–99)

## 2024-09-04 MED ORDER — GLUCERNA SHAKE PO LIQD: 237.0000 mL | Freq: Two times a day (BID) | ORAL | 0 refills | Status: DC

## 2024-09-04 MED ORDER — MINERAL OIL RE ENEM
1.0000 | ENEMA | Freq: Once | RECTAL | Status: AC
  Administered 2024-09-04: 1 via RECTAL

## 2024-09-04 MED ORDER — FERROUS SULFATE 325 (65 FE) MG PO TABS: 325.0000 mg | ORAL_TABLET | Freq: Two times a day (BID) | ORAL | 3 refills | Status: DC

## 2024-09-04 MED ORDER — COLLAGENASE 250 UNIT/GM EX OINT: TOPICAL_OINTMENT | Freq: Every day | CUTANEOUS | 0 refills | Status: DC

## 2024-09-04 MED ORDER — TRAMADOL HCL 50 MG PO TABS: 50.0000 mg | ORAL_TABLET | Freq: Four times a day (QID) | ORAL | 0 refills | Status: DC | PRN

## 2024-09-04 MED ORDER — BISACODYL 10 MG RE SUPP
10.0000 mg | Freq: Once | RECTAL | Status: AC
  Administered 2024-09-04: 10 mg via RECTAL
  Filled 2024-09-04: qty 1

## 2024-09-04 MED ORDER — PROSOURCE PLUS PO LIQD: 30.0000 mL | Freq: Two times a day (BID) | ORAL | 0 refills | Status: DC

## 2024-09-04 MED ORDER — DOXYCYCLINE HYCLATE 100 MG PO TABS: 100.0000 mg | ORAL_TABLET | Freq: Two times a day (BID) | ORAL | 0 refills | Status: AC

## 2024-09-04 MED ORDER — PREDNISONE 20 MG PO TABS: 40.0000 mg | ORAL_TABLET | Freq: Every day | ORAL | 0 refills | Status: AC

## 2024-09-04 MED ORDER — JUVEN PO PACK: 1.0000 | PACK | Freq: Two times a day (BID) | ORAL | 0 refills | Status: DC

## 2024-09-04 NOTE — TOC Transition Note (Signed)
 Transition of Care [facility]) - Discharge Note   Patient Details  Name: Michele House MRN: [id number] Date of Birth: [birth year]  Transition [facility]) CM/SW Contact:  Lauraine JAYSON Carpen, LCSW Phone Number: 09/04/2024, 4:30 PM   Clinical Narrative:   Patient has orders to discharge back to [facility] today. RN will call report to [phone] (Room 408B). RN will give enema and set up [facility] once she has had BM. No further concerns. CSW signing off.  Final next level of care: Skilled Nursing Facility Barriers to Discharge: Barriers Resolved   Patient Goals and CMS Choice            Discharge Placement   Existing PASRR number confirmed : 09/04/24          Patient chooses bed at: [facility] [place] Patient to be transferred to facility by: [facility] Name of family member notified: Patient declined. Patient and family notified of of transfer: 09/04/24  Discharge Plan and Services Additional resources added to the After Visit Summary for       Post Acute Care Choice: Resumption of Svcs/PTA Provider                               Social Drivers of Health (SDOH) Interventions SDOH Screenings   Food Insecurity: No Food Insecurity (09/01/2024)  Housing: Low Risk  (09/01/2024)  Transportation Needs: No Transportation Needs (09/01/2024)  Utilities: Not At Risk (09/01/2024)  Depression (PHQ2-9): Low Risk  (08/11/2024)  Social Connections: Socially Isolated (09/01/2024)  Tobacco Use: Medium Risk (09/01/2024)     Readmission Risk Interventions     No data to display

## 2024-09-04 NOTE — Discharge Summary (Addendum)
 Physician Discharge Summary   Patient: Michele House MRN: [id number] DOB: December 06, 1955  Admit date:     09/01/2024  Discharge date: 09/04/24  Discharge Physician: Leita Blanch   PCP: [facility], [facility]   Recommendations at discharge:    F/u PCP in 1-2 weeks Use  your oxygen, nebs as before  Discharge Diagnoses: Principal Problem:   COPD (chronic obstructive pulmonary disease) (HCC) Active Problems:   Diabetes mellitus (HCC)   Acute respiratory failure with hypoxia and hypercarbia (HCC)   Pressure injury of skin Michele House is a 68 y.o. female with medical history significant of COPD Gold stage III status post chemo and radiation therapy, on maintenance immune therapy, chronic hypoxic respiratory failure on 2 L continuously, RUL stage III non-small cell lung cancer status post chronic HFrEF with LVEF 30% July 2025, HTN, HLD, IDDM, hypothyroidism, diabetic neuropathy, anxiety/depression, presented with worsening of cough wheezing shortness of breath.    Acute on chronic hypoxic and hypercapnic respiratory failure COPD Gold stage III Acute COPD exacerbation - Continue BiPAP support  - IV Solu-Medrol  bridging for prednisone --change to po prednisone  -Doxycycline empiric - ICS and LABA - DuoNebs and as needed albuterol  - Incentive spirometry - respiratory viral panel--negaitve -pt feels better today--back to near baseline   Acute on chronic HFrEF decompensation --resume home dose lasix  - Echocardiogram was done less than 3 months ago, will not repeat echo at this time   History of right leg DVT, status post IVC filter - Happened January of this year, IVC filter placed instead of anticoagulation due to concurrent severe GI bleed.     History of duodenal ulcer and upper GI bleed  -During same hospitalization in January, patient also developed severe duodenum bleeding, subsequently underwent EGD and clipping but clipping failed and patient received IR embolization  2 days later. - H&H appears to be stable   Seizure disorder - Continue Keppra    IDDM - Continue Lantus  - Continue SSI  Overall at baseline. HIGH risk for re-admission due to multiple co-morbidities   Procedures:none Family communication :dter Michele House Consults :none CODE STATUS: full DVT Prophylaxis :lovenox       Pain control - [place]  Controlled Substance Reporting System database was reviewed. and patient was instructed, not to drive, operate heavy machinery, perform activities at heights, swimming or participation in water  activities or provide baby-sitting services while on Pain, Sleep and Anxiety Medications; until their outpatient Physician has advised to do so again. Also recommended to not to take more than prescribed Pain, Sleep and Anxiety Medications.  Disposition: Long term care facility Diet recommendation:  Discharge Diet Orders (From admission, onward)     Start     Ordered   09/04/24 0000  Diet - low sodium heart healthy        09/04/24 0908           Cardiac and Carb modified diet DISCHARGE MEDICATION: Allergies as of 09/04/2024       Reactions   Penicillins Hives   Aspirin  Other (See Comments)   Lips numb   Sulfa Antibiotics         Medication List     STOP taking these medications    diclofenac sodium 1 % Gel Commonly known as: VOLTAREN   docusate sodium  100 MG capsule Commonly known as: COLACE   doxycycline 100 MG capsule Commonly known as: MONODOX   glipiZIDE  10 MG tablet Commonly known as: GLUCOTROL    glipiZIDE  5 MG tablet Commonly known as: GLUCOTROL   hydrOXYzine  25 MG tablet Commonly known as: ATARAX    lisinopril -hydrochlorothiazide  20-25 MG tablet Commonly known as: ZESTORETIC    loperamide 2 MG tablet Commonly known as: IMODIUM A-D   LORazepam  0.5 MG tablet Commonly known as: ATIVAN        TAKE these medications    (feeding supplement) PROSource Plus liquid Take 30 mLs by mouth 2 (two) times daily  between meals.   feeding supplement (GLUCERNA SHAKE) Liqd Take 237 mLs by mouth 2 (two) times daily between meals.   nutrition supplement (JUVEN) Pack Take 1 packet by mouth 2 (two) times daily between meals.   acetaminophen  325 MG tablet Commonly known as: TYLENOL  Take 650 mg by mouth every 6 (six) hours as needed.   alum & mag hydroxide-simeth 400-400-40 MG/5ML suspension Commonly known as: MAALOX PLUS Take 10 mLs by mouth every 6 (six) hours as needed for indigestion.   aspirin  EC 81 MG tablet Take 81 mg by mouth daily.   atorvastatin  20 MG tablet Commonly known as: LIPITOR Take 20 mg by mouth daily.   busPIRone 10 MG tablet Commonly known as: BUSPAR Take 20 mg by mouth 2 (two) times daily. What changed: Another medication with the same name was removed. Continue taking this medication, and follow the directions you see here.   calcium  carbonate 500 MG chewable tablet Commonly known as: TUMS - dosed in mg elemental calcium  Chew 1 tablet by mouth daily.   cetirizine 10 MG tablet Commonly known as: ZYRTEC Take 10 mg by mouth daily.   collagenase 250 UNIT/GM ointment Commonly known as: SANTYL Apply topically daily.   doxycycline 100 MG tablet Commonly known as: VIBRA-TABS Take 1 tablet (100 mg total) by mouth every 12 (twelve) hours for 5 days.   Dulera 200-5 MCG/ACT Aero Generic drug: mometasone-formoterol Inhale 2 puffs into the lungs every 12 (twelve) hours.   ferrous sulfate 325 (65 FE) MG tablet Take 1 tablet (325 mg total) by mouth 2 (two) times daily with a meal.   furosemide  20 MG tablet Commonly known as: LASIX  Take 1 tablet (20 mg total) by mouth daily.   insulin  aspart 100 UNIT/ML injection Commonly known as: novoLOG  Inject 4 Units into the skin 3 (three) times daily with meals.   insulin  glargine-yfgn 100 UNIT/ML injection Commonly known as: SEMGLEE  Inject 0.18 mLs (18 Units total) into the skin daily.   ipratropium 17 MCG/ACT  inhaler Commonly known as: ATROVENT HFA Inhale 2 puffs into the lungs every 6 (six) hours as needed.   ipratropium-albuterol  0.5-2.5 (3) MG/3ML Soln Commonly known as: DUONEB Take 3 mLs by nebulization 2 (two) times daily. What changed: Another medication with the same name was removed. Continue taking this medication, and follow the directions you see here.   levETIRAcetam  500 MG tablet Commonly known as: KEPPRA  Take 1 tablet (500 mg total) by mouth 2 (two) times daily.   levothyroxine  125 MCG tablet Commonly known as: SYNTHROID  Take 125 mcg by mouth every morning. What changed: Another medication with the same name was removed. Continue taking this medication, and follow the directions you see here.   lidocaine  5 % Commonly known as: LIDODERM  Place 1 patch onto the skin daily. Remove & Discard patch within 12 hours or as directed by MD   lidocaine -prilocaine  cream Commonly known as: EMLA  Apply 1 Application topically once.   losartan 25 MG tablet Commonly known as: COZAAR Take 25 mg by mouth daily.   meclizine  12.5 MG tablet Commonly known as: ANTIVERT  Take 12.5 mg by  mouth 2 (two) times daily.   metoprolol succinate 50 MG 24 hr tablet Commonly known as: TOPROL-XL Take 50 mg by mouth daily. Take with or immediately following a meal.   multivitamin capsule Take 1 capsule by mouth daily.   naloxone 4 MG/0.1ML Liqd nasal spray kit Commonly known as: NARCAN Place 1 spray into the nose.   nystatin powder Commonly known as: MYCOSTATIN/NYSTOP Apply 1 Application topically 2 (two) times daily.   ondansetron  4 MG/5ML solution Commonly known as: ZOFRAN  Take 8 mg by mouth every 8 (eight) hours as needed for refractory nausea / vomiting.   pantoprazole  40 MG tablet Commonly known as: PROTONIX  Take 1 tablet (40 mg total) by mouth 2 (two) times daily before a meal.   polyethylene glycol 17 g packet Commonly known as: MIRALAX  / GLYCOLAX  Take 17 g by mouth daily.    predniSONE  20 MG tablet Commonly known as: DELTASONE  Take 2 tablets (40 mg total) by mouth daily with breakfast for 4 days. Start taking on: September 05, 2024 What changed:  medication strength how much to take when to take this   pregabalin  50 MG capsule Commonly known as: LYRICA  Take 50 mg by mouth 2 (two) times daily.   rOPINIRole  4 MG 24 hr tablet Commonly known as: REQUIP  XL Take 1 tablet (4 mg total) by mouth at bedtime.   senna 8.6 MG Tabs tablet Commonly known as: SENOKOT Take 1 tablet (8.6 mg total) by mouth in the morning and at bedtime.   sertraline  100 MG tablet Commonly known as: ZOLOFT  Take 100 mg by mouth daily.   Tradjenta  5 MG Tabs tablet Generic drug: linagliptin  Take 5 mg by mouth daily.   traMADol  50 MG tablet Commonly known as: ULTRAM  Take 1 tablet (50 mg total) by mouth every 6 (six) hours as needed for severe pain (pain score 7-10).   ZINC OXIDE (TOPICAL) 25 % Oint Apply 1 Application topically 2 (two) times daily.               Discharge Care Instructions  (From admission, onward)           Start     Ordered   09/04/24 0000  Discharge wound care:       Comments: 09/01/24 2200    Wound care  Daily      Comments: Cleanse sacral wound with NS, Apply 1/4 thick layer of Santyl to wound bed, top with saline moist gauze, dry gauze,  ABD pad and tape or silicone foam whichever is preferred  09/01/24 1902   09/04/24 0908            Follow-up Information     [facility]. Schedule an appointment as soon as possible for a visit in 1 week(s).   Specialty: General Practice Contact information: [address] [place] [address] [phone]                Discharge Exam: Filed Weights   09/02/24 0500 09/03/24 0722 09/04/24 0649  Weight: 60.2 kg (P) 93.1 kg 94.1 kg   GENERAL:  68 y.o.-year-old patient with no acute distress.  LUNGS: decreased breath sounds bilaterally, no  wheezing CARDIOVASCULAR: S1, S2 normal. No murmur   ABDOMEN: Soft, nontender, nondistended. Bowel sounds present.  EXTREMITIES: No  edema b/l.    NEUROLOGIC: nonfocal  patient is alert and awake  Condition at discharge: fair  The results of significant diagnostics from this hospitalization (including imaging, microbiology, ancillary and laboratory) are listed  below for reference.   Imaging Studies: DG Chest 1 View Result Date: 09/02/2024 CLINICAL DATA:  Congestive heart failure. EXAM: CHEST  1 VIEW COMPARISON:  09/01/2024 FINDINGS: Stable mild cardiomegaly. Left-sided power port remains in appropriate position. Diffuse pulmonary interstitial infiltrates show no significant change. Poorly defined airspace opacity in the right upper lobe also shows no significant change. No pneumothorax or pleural effusion identified. IMPRESSION: No significant change in diffuse bilateral interstitial infiltrates and right upper lobe airspace opacity. Electronically Signed   By: Norleen DELENA Kil M.D.   On: 09/02/2024 06:25   DG Chest Portable 1 View Result Date: 09/01/2024 EXAM: 1 VIEW(S) XRAY OF THE CHEST 09/01/2024 09:52:00 AM COMPARISON: 08/08/2024 CLINICAL HISTORY: hypoxia FINDINGS: LINES, TUBES AND DEVICES: Stable left internal jugular port-a-cath. LUNGS AND PLEURA: Stable bilateral diffuse interstitial densities are noted concerning for edema or inflammation. Right upper lobe opacity is again noted concerning for neoplasm or possible pneumonia. Minimal pleural effusions may be present. No pneumothorax. HEART AND MEDIASTINUM: Stable cardiomediastinal silhouette. BONES AND SOFT TISSUES: No acute osseous abnormality. IMPRESSION: 1. Stable bilateral diffuse interstitial densities, suggestive of edema or inflammation. 2. Stable right upper lobe opacity, suspicious for neoplasm or pneumonia. 3. Minimal pleural effusions. Electronically signed by: Lynwood Seip MD 09/01/2024 10:26 AM EDT RP Workstation: [id number]   DG  Chest 2 View Result Date: 08/08/2024 CLINICAL DATA:  Pleural effusion. EXAM: CHEST - 2 VIEW COMPARISON:  November 12, 2023. FINDINGS: Mild cardiomegaly is noted. Left internal jugular Port-A-Cath is noted with tip in SVC. Mild central pulmonary vascular congestion is noted. Bilateral diffuse interstitial densities are noted concerning for pulmonary edema or possible atypical infection. Focal right upper lobe opacity is noted concerning for focal pneumonia. Small pleural effusions are noted. Bony thorax is unremarkable. IMPRESSION: Mild cardiomegaly with mild central pulmonary vascular congestion. Bilateral diffuse interstitial densities are noted concerning for pulmonary edema or possible atypical infection. Small pleural effusions are noted. Focal right upper lobe opacity is noted concerning for possible pneumonia. Electronically Signed   By: Lynwood Seip Raddle M.D.   On: 08/08/2024 13:00    Microbiology: Results for orders placed or performed during the hospital encounter of 09/01/24  Resp panel by RT-PCR (RSV, Flu A&B, Covid) Anterior Nasal Swab     Status: None   Collection Time: 09/01/24  9:43 AM   Specimen: Anterior Nasal Swab  Result Value Ref Range Status   SARS Coronavirus 2 by RT PCR NEGATIVE NEGATIVE Final    Comment: (NOTE) SARS-CoV-2 target nucleic acids are NOT DETECTED.  The SARS-CoV-2 RNA is generally detectable in upper respiratory specimens during the acute phase of infection. The lowest concentration of SARS-CoV-2 viral copies this assay can detect is 138 copies/mL. A negative result does not preclude SARS-Cov-2 infection and should not be used as the sole basis for treatment or other patient management decisions. A negative result may occur with  improper specimen collection/handling, submission of specimen other than nasopharyngeal swab, presence of viral mutation(s) within the areas targeted by this assay, and inadequate number of viral copies(<138 copies/mL). A negative  result must be combined with clinical observations, patient history, and epidemiological information. The expected result is Negative.  Fact Sheet for Patients:  bloggercourse.com  Fact Sheet for Healthcare Providers:  seriousbroker.it  This test is no t yet approved or cleared by the [place]  FDA and  has been authorized for detection and/or diagnosis of SARS-CoV-2 by FDA under an Emergency Use Authorization (EUA). This EUA will remain  in effect (meaning  this test can be used) for the duration of the COVID-19 declaration under Section 564(b)(1) of the Act, 21 U.S.C.section 360bbb-3(b)(1), unless the authorization is terminated  or revoked sooner.       Influenza A by PCR NEGATIVE NEGATIVE Final   Influenza B by PCR NEGATIVE NEGATIVE Final    Comment: (NOTE) The Xpert Xpress SARS-CoV-2/FLU/RSV plus assay is intended as an aid in the diagnosis of influenza from Nasopharyngeal swab specimens and should not be used as a sole basis for treatment. Nasal washings and aspirates are unacceptable for Xpert Xpress SARS-CoV-2/FLU/RSV testing.  Fact Sheet for Patients: bloggercourse.com  Fact Sheet for Healthcare Providers: seriousbroker.it  This test is not yet approved or cleared by the [place]  FDA and has been authorized for detection and/or diagnosis of SARS-CoV-2 by FDA under an Emergency Use Authorization (EUA). This EUA will remain in effect (meaning this test can be used) for the duration of the COVID-19 declaration under Section 564(b)(1) of the Act, 21 U.S.C. section 360bbb-3(b)(1), unless the authorization is terminated or revoked.     Resp Syncytial Virus by PCR NEGATIVE NEGATIVE Final    Comment: (NOTE) Fact Sheet for Patients: bloggercourse.com  Fact Sheet for Healthcare Providers: seriousbroker.it  This  test is not yet approved or cleared by the [place]  FDA and has been authorized for detection and/or diagnosis of SARS-CoV-2 by FDA under an Emergency Use Authorization (EUA). This EUA will remain in effect (meaning this test can be used) for the duration of the COVID-19 declaration under Section 564(b)(1) of the Act, 21 U.S.C. section 360bbb-3(b)(1), unless the authorization is terminated or revoked.  Performed at [facility], [address] [place], [place] [address]   Blood culture (routine x 2)     Status: None (Preliminary result)   Collection Time: 09/01/24 12:12 PM   Specimen: BLOOD  Result Value Ref Range Status   Specimen Description BLOOD BLOOD RIGHT HAND  Final   Special Requests   Final    BOTTLES DRAWN AEROBIC AND ANAEROBIC Blood Culture results may not be optimal due to an inadequate volume of blood received in culture bottles   Culture   Final    NO GROWTH 3 DAYS Performed at [facility], [address], [place], [place] [address]    Report Status PENDING  Incomplete  Blood culture (routine x 2)     Status: None (Preliminary result)   Collection Time: 09/01/24 12:12 PM   Specimen: BLOOD  Result Value Ref Range Status   Specimen Description BLOOD BLOOD LEFT HAND  Final   Special Requests   Final    BOTTLES DRAWN AEROBIC AND ANAEROBIC Blood Culture results may not be optimal due to an inadequate volume of blood received in culture bottles   Culture   Final    NO GROWTH 3 DAYS Performed at [facility], [address], [place], [place] [address]    Report Status PENDING  Incomplete  Respiratory (~20 pathogens) panel by PCR     Status: None   Collection Time: 09/01/24  2:45 PM   Specimen: Nasopharyngeal Swab; Respiratory  Result Value Ref Range Status   Adenovirus NOT DETECTED NOT DETECTED Final   Coronavirus 229E NOT DETECTED NOT DETECTED Final    Comment: (NOTE) The Coronavirus on the Respiratory Panel, DOES NOT test for the  novel  Coronavirus (2019 nCoV)    Coronavirus HKU1 NOT DETECTED NOT DETECTED Final   Coronavirus NL63 NOT DETECTED NOT DETECTED Final   Coronavirus  OC43 NOT DETECTED NOT DETECTED Final   Metapneumovirus NOT DETECTED NOT DETECTED Final   Rhinovirus / Enterovirus NOT DETECTED NOT DETECTED Final   Influenza A NOT DETECTED NOT DETECTED Final   Influenza B NOT DETECTED NOT DETECTED Final   Parainfluenza Virus 1 NOT DETECTED NOT DETECTED Final   Parainfluenza Virus 2 NOT DETECTED NOT DETECTED Final   Parainfluenza Virus 3 NOT DETECTED NOT DETECTED Final   Parainfluenza Virus 4 NOT DETECTED NOT DETECTED Final   Respiratory Syncytial Virus NOT DETECTED NOT DETECTED Final   Bordetella pertussis NOT DETECTED NOT DETECTED Final   Bordetella Parapertussis NOT DETECTED NOT DETECTED Final   Chlamydophila pneumoniae NOT DETECTED NOT DETECTED Final   Mycoplasma pneumoniae NOT DETECTED NOT DETECTED Final    Comment: Performed at [facility], [address] [place], [place] [address]    Labs: CBC: Recent Labs  Lab 09/01/24 0943  WBC 3.5*  NEUTROABS 2.9  HGB 9.9*  HCT 33.9*  MCV 85.6  PLT 197   Basic Metabolic Panel: Recent Labs  Lab 09/01/24 0943 09/02/24 0630  NA 146* 144  K 3.7 3.9  CL 93* 95*  CO2 39* 40*  GLUCOSE 191* 181*  BUN 19 23  CREATININE 0.67 0.70  CALCIUM  11.1* 11.2*   Liver Function Tests: Recent Labs  Lab 09/01/24 0943  AST 15  ALT 8  ALKPHOS 82  BILITOT 0.4  PROT 5.6*  ALBUMIN 2.7*   CBG: Recent Labs  Lab 09/03/24 0844 09/03/24 1229 09/03/24 1632 09/03/24 2057 09/04/24 0812  GLUCAP 100* 246* 350* 282* 101*    Discharge time spent: greater than 30 minutes.  Signed: Leita Blanch, MD [facility] 09/04/2024

## 2024-09-04 NOTE — Progress Notes (Signed)
 Heart Failure Navigator Progress Note  Assessed for Heart & Vascular TOC clinic readiness.  Patient does not meet criteria due to current [facility] Cardiology patient.   Navigator will sign off at this time.  Roxy Horseman, RN, BSN [facility] Heart Failure Navigator Secure Chat Only

## 2024-09-04 NOTE — [place] FL2 (Signed)
 [place]  MEDICAID FL2 LEVEL OF CARE FORM     IDENTIFICATION  Patient Name: Michele House Birthdate: Sep 15, 1956 Sex: female Admission Date (Current Location): 09/01/2024  [place] and [place] Number:  Chiropodist and Address:  [facility], [address], [place], [place] [address]      Provider Number: [id number]  Attending Physician Name and Address:  Tobie Calix, MD  Relative Name and Phone Number:       Current Level of Care: Hospital Recommended Level of Care: Skilled Nursing Facility Prior Approval Number:    Date Approved/Denied:   PASRR Number: [id number]  Discharge Plan: SNF    Current Diagnoses: Patient Active Problem List   Diagnosis Date Noted   Acute respiratory failure with hypoxia and hypercarbia (HCC) 09/02/2024   Pressure injury of skin 09/02/2024   Encounter for antineoplastic chemotherapy 02/04/2024   IDA (iron  deficiency anemia) 01/07/2024   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) 12/17/2023   Anemia 12/17/2023   Acute deep vein thrombosis (DVT) of right lower extremity (HCC) 11/09/2023   Bilateral leg pain 11/09/2023   Melena 10/28/2023   Gastrointestinal hemorrhage with melena 10/28/2023   Duodenal ulcer 10/28/2023   CVA (cerebral vascular accident) (HCC) 10/14/2023   Hypotension 10/11/2023   Hypernatremia 10/11/2023   Uncontrolled type 2 diabetes mellitus with hypoglycemia, without long-term current use of insulin  (HCC) 10/08/2023   Acute metabolic encephalopathy 10/06/2023   Altered mental status 10/02/2023   UTI (urinary tract infection) 10/02/2023   (HFpEF) heart failure with preserved ejection fraction (HCC) 10/02/2023   Pneumonia 09/24/2023   COPD (chronic obstructive pulmonary disease) (HCC) 09/24/2023   Acute CHF (congestive heart failure) (HCC) 09/23/2023   COPD exacerbation (HCC) 06/07/2023   Acute on chronic respiratory failure with hypoxia (HCC) 06/07/2023   Allergic reaction 06/07/2023    Disequilibrium 07/16/2015   Pain in right shoulder 05/28/2015   Dysphagia 05/08/2015   Acute sinusitis 10/02/2014   Other specified anxiety disorders 09/19/2014   Low back pain 07/03/2014   Shoulder joint pain 05/01/2014   Other malaise 02/01/2014   Chest discomfort 10/12/2013   Tobacco use disorder 10/12/2013   Essential hypertension 10/12/2013   Obesity 10/12/2013   Diabetes mellitus (HCC) 10/12/2013   Allergic rhinitis 03/23/2012   Trigger finger 03/23/2012   Vitamin D  deficiency 03/23/2012   Pure hypercholesterolemia 09/05/2009   Hypothyroidism 12/24/2006   Osteoarthritis 12/24/2006   Scoliosis 12/24/2006    Orientation RESPIRATION BLADDER Height & Weight     Self, Place  O2 (Nasal Cannula 4 L. Per MD, does not need bipap at discharge.) Incontinent, External catheter Weight: 207 lb 7.3 oz (94.1 kg) (RN notified) Height:  5' 1 (154.9 cm)  BEHAVIORAL SYMPTOMS/MOOD NEUROLOGICAL BOWEL NUTRITION STATUS   (None)  (None) Continent Diet (Regular)  AMBULATORY STATUS COMMUNICATION OF NEEDS Skin     Verbally Other (Comment), PU Stage and Appropriate Care (Erythema/redness.)     PU Stage 3 Dressing:  (Coccyx: Foam.)                 Personal Care Assistance Level of Assistance  Total care       Total Care Assistance: Maximum assistance   Functional Limitations Info  Sight, Hearing, Speech Sight Info: Adequate Hearing Info: Adequate Speech Info: Adequate    SPECIAL CARE FACTORS FREQUENCY                       Contractures Contractures Info: Not present  Additional Factors Info  Code Status, Allergies Code Status Info: Full code Allergies Info: Penicillins, Aspirin , Sulfa Antibiotics           Current Medications (09/04/2024):  This is the current hospital active medication list Current Facility-Administered Medications  Medication Dose Route Frequency Provider Last Rate Last Admin   (feeding supplement) PROSource Plus liquid 30 mL  30 mL Oral BID  BM Patel, Sona, MD   30 mL at 09/04/24 1023   acetaminophen  (TYLENOL ) tablet 650 mg  650 mg Oral Q6H PRN Laurita Manor T, MD   650 mg at 09/03/24 0528   albuterol  (PROVENTIL ) (2.5 MG/3ML) 0.083% nebulizer solution 2.5 mg  2.5 mg Nebulization Q2H PRN Laurita Manor DASEN, MD       aspirin  EC tablet 81 mg  81 mg Oral Daily Laurita Manor T, MD   81 mg at 09/04/24 1020   atorvastatin  (LIPITOR) tablet 20 mg  20 mg Oral Daily Laurita Manor T, MD   20 mg at 09/04/24 1021   busPIRone (BUSPAR) tablet 20 mg  20 mg Oral BID Laurita Manor T, MD   20 mg at 09/04/24 1021   collagenase (SANTYL) ointment   Topical Daily Laurita Manor DASEN, MD   Given at 09/04/24 1022   doxycycline (VIBRA-TABS) tablet 100 mg  100 mg Oral Q12H Laurita Manor T, MD   100 mg at 09/04/24 1020   enoxaparin  (LOVENOX ) injection 40 mg  40 mg Subcutaneous Q24H Laurita Manor T, MD   40 mg at 09/03/24 2150   feeding supplement (GLUCERNA SHAKE) (GLUCERNA SHAKE) liquid 237 mL  237 mL Oral BID BM Patel, Sona, MD   237 mL at 09/04/24 1022   ferrous sulfate tablet 325 mg  325 mg Oral BID WC Zhang, Ping T, MD   325 mg at 09/04/24 0901   fluticasone furoate-vilanterol (BREO ELLIPTA) 100-25 MCG/ACT 1 puff  1 puff Inhalation Daily Laurita Manor T, MD   1 puff at 09/04/24 0902   furosemide  (LASIX ) tablet 20 mg  20 mg Oral Daily Zhang, Ping T, MD   20 mg at 09/04/24 1020   insulin  aspart (novoLOG ) injection 0-15 Units  0-15 Units Subcutaneous TID WC Zhang, Ping T, MD   11 Units at 09/03/24 1643   insulin  glargine-yfgn (SEMGLEE ) injection 15 Units  15 Units Subcutaneous Daily Laurita Manor T, MD   15 Units at 09/04/24 1056   ipratropium-albuterol  (DUONEB) 0.5-2.5 (3) MG/3ML nebulizer solution 3 mL  3 mL Nebulization TID Patel, Sona, MD   3 mL at 09/04/24 0803   levETIRAcetam  (KEPPRA ) tablet 500 mg  500 mg Oral BID Laurita Manor T, MD   500 mg at 09/04/24 1020   levothyroxine  (SYNTHROID ) tablet 125 mcg  125 mcg Oral Q0600 Laurita Manor T, MD   125 mcg at 09/04/24 0451   loratadine   (CLARITIN ) tablet 10 mg  10 mg Oral Daily Laurita Manor T, MD   10 mg at 09/04/24 1020   multivitamin with minerals tablet 1 tablet  1 tablet Oral Daily Patel, Sona, MD   1 tablet at 09/03/24 1643   nutrition supplement (JUVEN) (JUVEN) powder packet 1 packet  1 packet Oral BID BM Patel, Sona, MD   1 packet at 09/04/24 1022   ondansetron  (ZOFRAN ) injection 4 mg  4 mg Intravenous Q6H PRN Laurita Manor T, MD       pantoprazole  (PROTONIX ) EC tablet 40 mg  40 mg Oral BID AC Zhang, Ping T, MD   40 mg at  09/04/24 0901   phenol (CHLORASEPTIC) mouth spray 1 spray  1 spray Mouth/Throat 5 X Daily PRN Patel, Sona, MD   1 spray at 09/03/24 1247   polyethylene glycol (MIRALAX  / GLYCOLAX ) packet 17 g  17 g Oral Daily Laurita Manor T, MD   17 g at 09/04/24 1021   predniSONE  (DELTASONE ) tablet 40 mg  40 mg Oral Q breakfast Laurita Manor T, MD   40 mg at 09/04/24 0901   pregabalin  (LYRICA ) capsule 50 mg  50 mg Oral BID Laurita Manor T, MD   50 mg at 09/04/24 1021   rOPINIRole  (REQUIP  XL) 24 hr tablet 4 mg  4 mg Oral QHS Laurita Manor T, MD   4 mg at 09/03/24 2150   senna (SENOKOT) tablet 8.6 mg  1 tablet Oral QHS PRN Laurita Manor T, MD       sertraline  (ZOLOFT ) tablet 100 mg  100 mg Oral Daily Laurita Manor T, MD   100 mg at 09/04/24 1021   sodium chloride  flush (NS) 0.9 % injection 3 mL  3 mL Intravenous Q12H Laurita Manor T, MD   3 mL at 09/04/24 1022   sodium chloride  flush (NS) 0.9 % injection 3 mL  3 mL Intravenous PRN Laurita Manor DASEN, MD       Facility-Administered Medications Ordered in Other Encounters  Medication Dose Route Frequency Provider Last Rate Last Admin   oxyCODONE  (Oxy IR/ROXICODONE ) immediate release tablet 5 mg  5 mg Oral Once Agrawal, Kavita, MD         Discharge Medications: Please see discharge summary for a list of discharge medications.  Relevant Imaging Results:  Relevant Lab Results:   Additional Information SS#: [id number]  Lauraine JAYSON Carpen, LCSW

## 2024-09-04 NOTE — Progress Notes (Signed)
 [facility] is here to pick patient  up to discharge to [facility]. Patient PIV removed, Purewick removed and Telemetry removed. Patient belongings returned. AVS given to EMS.

## 2024-09-04 NOTE — Plan of Care (Signed)

## 2024-09-04 NOTE — TOC Initial Note (Signed)
 Transition of Care [facility]) - Initial/Assessment Note    Patient Details  Name: Michele House MRN: [id number] Date of Birth: [birth year]  Transition of Care [facility]) CM/SW Contact:    Lauraine JAYSON Carpen, LCSW Phone Number: 09/04/2024, 12:47 PM  Clinical Narrative:   CSW met with patient. No family at bedside. CSW introduced role and explained that discharge planning would be discussed. Patient confirmed plan to return to [facility] today. She is a long-term resident. She does not want CSW to call and update family, stating that they are already aware. Per RN, patient has to have a BM prior to discharge.                Expected Discharge Plan: Skilled Nursing Facility Barriers to Discharge: Other (must enter comment) (Needs BM)   Patient Goals and CMS Choice            Expected Discharge Plan and Services     Post Acute Care Choice: Resumption of Svcs/PTA Provider Living arrangements for the past 2 months: Skilled Nursing Facility Expected Discharge Date: 09/04/24                                    Prior Living Arrangements/Services Living arrangements for the past 2 months: Skilled Nursing Facility Lives with:: Facility Resident Patient language and need for interpreter reviewed:: Yes Do you feel safe going back to the place where you live?: Yes      Need for Family Participation in Patient Care: Yes (Comment) Care giver support system in place?: Yes (comment)   Criminal Activity/Legal Involvement Pertinent to Current Situation/Hospitalization: No - Comment as needed  Activities of Daily Living   ADL Screening (condition at time of admission) Independently performs ADLs?: No Does the patient have a NEW difficulty with bathing/dressing/toileting/self-feeding that is expected to last >3 days?: No Does the patient have a NEW difficulty with getting in/out of bed, walking, or climbing stairs that is expected to last >3 days?: No Does the patient have a NEW  difficulty with communication that is expected to last >3 days?: No Is the patient deaf or have difficulty hearing?: No Does the patient have difficulty seeing, even when wearing glasses/contacts?: No Does the patient have difficulty concentrating, remembering, or making decisions?: No  Permission Sought/Granted Permission sought to share information with : Facility Industrial/product Designer granted to share information with : Yes, Verbal Permission Granted     Permission granted to share info w AGENCY: [facility]        Emotional Assessment Appearance:: Appears stated age Attitude/Demeanor/Rapport: Engaged, Gracious Affect (typically observed): Accepting, Appropriate, Calm, Pleasant Orientation: : Oriented to Self, Oriented to Place Alcohol / Substance Use: Not Applicable Psych Involvement: No (comment)  Admission diagnosis:  COPD (chronic obstructive pulmonary disease) (HCC) [J44.9] COPD exacerbation (HCC) [J44.1] Acute respiratory failure with hypoxia and hypercarbia (HCC) [J96.01, J96.02] Patient Active Problem List   Diagnosis Date Noted   Acute respiratory failure with hypoxia and hypercarbia (HCC) 09/02/2024   Pressure injury of skin 09/02/2024   Encounter for antineoplastic chemotherapy 02/04/2024   IDA (iron  deficiency anemia) 01/07/2024   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) 12/17/2023   Anemia 12/17/2023   Acute deep vein thrombosis (DVT) of right lower extremity (HCC) 11/09/2023   Bilateral leg pain 11/09/2023   Melena 10/28/2023   Gastrointestinal hemorrhage with melena 10/28/2023   Duodenal ulcer 10/28/2023  CVA (cerebral vascular accident) (HCC) 10/14/2023   Hypotension 10/11/2023   Hypernatremia 10/11/2023   Uncontrolled type 2 diabetes mellitus with hypoglycemia, without long-term current use of insulin  (HCC) 10/08/2023   Acute metabolic encephalopathy 10/06/2023   Altered mental status 10/02/2023   UTI (urinary tract infection)  10/02/2023   (HFpEF) heart failure with preserved ejection fraction (HCC) 10/02/2023   Pneumonia 09/24/2023   COPD (chronic obstructive pulmonary disease) (HCC) 09/24/2023   Acute CHF (congestive heart failure) (HCC) 09/23/2023   COPD exacerbation (HCC) 06/07/2023   Acute on chronic respiratory failure with hypoxia (HCC) 06/07/2023   Allergic reaction 06/07/2023   Disequilibrium 07/16/2015   Pain in right shoulder 05/28/2015   Dysphagia 05/08/2015   Acute sinusitis 10/02/2014   Other specified anxiety disorders 09/19/2014   Low back pain 07/03/2014   Shoulder joint pain 05/01/2014   Other malaise 02/01/2014   Chest discomfort 10/12/2013   Tobacco use disorder 10/12/2013   Essential hypertension 10/12/2013   Obesity 10/12/2013   Diabetes mellitus (HCC) 10/12/2013   Allergic rhinitis 03/23/2012   Trigger finger 03/23/2012   Vitamin D  deficiency 03/23/2012   Pure hypercholesterolemia 09/05/2009   Hypothyroidism 12/24/2006   Osteoarthritis 12/24/2006   Scoliosis 12/24/2006   PCP:  [facility]:   [facility] - [place], [place] - [facility] [address] [place] [address] Phone: [phone] Fax: [phone]     Social Drivers of Health (SDOH) Social History: SDOH Screenings   Food Insecurity: No Food Insecurity (09/01/2024)  Housing: Low Risk  (09/01/2024)  Transportation Needs: No Transportation Needs (09/01/2024)  Utilities: Not At Risk (09/01/2024)  Depression (PHQ2-9): Low Risk  (08/11/2024)  Social Connections: Socially Isolated (09/01/2024)  Tobacco Use: Medium Risk (09/01/2024)   SDOH Interventions:     Readmission Risk Interventions     No data to display

## 2024-09-05 ENCOUNTER — Other Ambulatory Visit: Payer: Self-pay

## 2024-09-06 LAB — CULTURE, BLOOD (ROUTINE X 2)
Culture: NO GROWTH
Culture: NO GROWTH

## 2024-09-08 ENCOUNTER — Inpatient Hospital Stay (HOSPITAL_BASED_OUTPATIENT_CLINIC_OR_DEPARTMENT_OTHER): Admitting: Oncology

## 2024-09-08 ENCOUNTER — Inpatient Hospital Stay

## 2024-09-08 ENCOUNTER — Inpatient Hospital Stay: Attending: Internal Medicine

## 2024-09-08 ENCOUNTER — Encounter: Payer: Self-pay | Admitting: Oncology

## 2024-09-08 VITALS — BP 119/57 | HR 80

## 2024-09-08 VITALS — BP 120/59 | HR 82 | Temp 96.9°F | Resp 18 | Ht 61.0 in | Wt 146.0 lb

## 2024-09-08 DIAGNOSIS — Z87891 Personal history of nicotine dependence: Secondary | ICD-10-CM | POA: Insufficient documentation

## 2024-09-08 DIAGNOSIS — E785 Hyperlipidemia, unspecified: Secondary | ICD-10-CM | POA: Insufficient documentation

## 2024-09-08 DIAGNOSIS — Z794 Long term (current) use of insulin: Secondary | ICD-10-CM | POA: Diagnosis not present

## 2024-09-08 DIAGNOSIS — Z803 Family history of malignant neoplasm of breast: Secondary | ICD-10-CM | POA: Insufficient documentation

## 2024-09-08 DIAGNOSIS — K5900 Constipation, unspecified: Secondary | ICD-10-CM | POA: Diagnosis not present

## 2024-09-08 DIAGNOSIS — Z79890 Hormone replacement therapy (postmenopausal): Secondary | ICD-10-CM | POA: Insufficient documentation

## 2024-09-08 DIAGNOSIS — Z9049 Acquired absence of other specified parts of digestive tract: Secondary | ICD-10-CM | POA: Insufficient documentation

## 2024-09-08 DIAGNOSIS — Z7952 Long term (current) use of systemic steroids: Secondary | ICD-10-CM | POA: Insufficient documentation

## 2024-09-08 DIAGNOSIS — J449 Chronic obstructive pulmonary disease, unspecified: Secondary | ICD-10-CM | POA: Insufficient documentation

## 2024-09-08 DIAGNOSIS — Z7951 Long term (current) use of inhaled steroids: Secondary | ICD-10-CM | POA: Insufficient documentation

## 2024-09-08 DIAGNOSIS — E119 Type 2 diabetes mellitus without complications: Secondary | ICD-10-CM | POA: Insufficient documentation

## 2024-09-08 DIAGNOSIS — I10 Essential (primary) hypertension: Secondary | ICD-10-CM | POA: Diagnosis not present

## 2024-09-08 DIAGNOSIS — I110 Hypertensive heart disease with heart failure: Secondary | ICD-10-CM | POA: Insufficient documentation

## 2024-09-08 DIAGNOSIS — C3411 Malignant neoplasm of upper lobe, right bronchus or lung: Secondary | ICD-10-CM

## 2024-09-08 DIAGNOSIS — Z923 Personal history of irradiation: Secondary | ICD-10-CM | POA: Insufficient documentation

## 2024-09-08 DIAGNOSIS — Z9221 Personal history of antineoplastic chemotherapy: Secondary | ICD-10-CM | POA: Insufficient documentation

## 2024-09-08 DIAGNOSIS — Z79899 Other long term (current) drug therapy: Secondary | ICD-10-CM | POA: Diagnosis not present

## 2024-09-08 DIAGNOSIS — R079 Chest pain, unspecified: Secondary | ICD-10-CM | POA: Diagnosis not present

## 2024-09-08 DIAGNOSIS — Z5112 Encounter for antineoplastic immunotherapy: Secondary | ICD-10-CM | POA: Insufficient documentation

## 2024-09-08 DIAGNOSIS — Z7982 Long term (current) use of aspirin: Secondary | ICD-10-CM | POA: Diagnosis not present

## 2024-09-08 DIAGNOSIS — G8929 Other chronic pain: Secondary | ICD-10-CM | POA: Insufficient documentation

## 2024-09-08 DIAGNOSIS — Z7984 Long term (current) use of oral hypoglycemic drugs: Secondary | ICD-10-CM | POA: Insufficient documentation

## 2024-09-08 DIAGNOSIS — K219 Gastro-esophageal reflux disease without esophagitis: Secondary | ICD-10-CM | POA: Diagnosis not present

## 2024-09-08 DIAGNOSIS — Z86718 Personal history of other venous thrombosis and embolism: Secondary | ICD-10-CM | POA: Diagnosis not present

## 2024-09-08 LAB — CBC WITH DIFFERENTIAL (CANCER CENTER ONLY)
Abs Immature Granulocytes: 0.16 K/uL — ABNORMAL HIGH (ref 0.00–0.07)
Basophils Absolute: 0 K/uL (ref 0.0–0.1)
Basophils Relative: 0 %
Eosinophils Absolute: 0 K/uL (ref 0.0–0.5)
Eosinophils Relative: 0 %
HCT: 30.6 % — ABNORMAL LOW (ref 36.0–46.0)
Hemoglobin: 9.4 g/dL — ABNORMAL LOW (ref 12.0–15.0)
Immature Granulocytes: 3 %
Lymphocytes Relative: 5 %
Lymphs Abs: 0.3 K/uL — ABNORMAL LOW (ref 0.7–4.0)
MCH: 25 pg — ABNORMAL LOW (ref 26.0–34.0)
MCHC: 30.7 g/dL (ref 30.0–36.0)
MCV: 81.4 fL (ref 80.0–100.0)
Monocytes Absolute: 0.5 K/uL (ref 0.1–1.0)
Monocytes Relative: 9 %
Neutro Abs: 4.8 K/uL (ref 1.7–7.7)
Neutrophils Relative %: 83 %
Platelet Count: 195 K/uL (ref 150–400)
RBC: 3.76 MIL/uL — ABNORMAL LOW (ref 3.87–5.11)
RDW: 16.7 % — ABNORMAL HIGH (ref 11.5–15.5)
WBC Count: 5.8 K/uL (ref 4.0–10.5)
nRBC: 0 % (ref 0.0–0.2)

## 2024-09-08 LAB — CMP (CANCER CENTER ONLY)
ALT: 16 U/L (ref 0–44)
AST: 16 U/L (ref 15–41)
Albumin: 2.9 g/dL — ABNORMAL LOW (ref 3.5–5.0)
Alkaline Phosphatase: 76 U/L (ref 38–126)
Anion gap: 8 (ref 5–15)
BUN: 26 mg/dL — ABNORMAL HIGH (ref 8–23)
CO2: 35 mmol/L — ABNORMAL HIGH (ref 22–32)
Calcium: 10.1 mg/dL (ref 8.9–10.3)
Chloride: 98 mmol/L (ref 98–111)
Creatinine: 0.51 mg/dL (ref 0.44–1.00)
GFR, Estimated: >60 mL/min (ref >60)
Glucose, Bld: 158 mg/dL — ABNORMAL HIGH (ref 70–99)
Potassium: 3.8 mmol/L (ref 3.5–5.1)
Sodium: 141 mmol/L (ref 135–145)
Total Bilirubin: 0.4 mg/dL (ref 0.0–1.2)
Total Protein: 5.6 g/dL — ABNORMAL LOW (ref 6.5–8.1)

## 2024-09-08 LAB — TSH: TSH: 1.746 u[IU]/mL (ref 0.350–4.500)

## 2024-09-08 MED ORDER — SODIUM CHLORIDE 0.9 % IV SOLN
INTRAVENOUS | Status: DC
  Filled 2024-09-08: qty 250

## 2024-09-08 MED ORDER — SODIUM CHLORIDE 0.9 % IV SOLN
1500.0000 mg | Freq: Once | INTRAVENOUS | Status: AC
  Administered 2024-09-08: 1500 mg via INTRAVENOUS
  Filled 2024-09-08: qty 30

## 2024-09-08 NOTE — Progress Notes (Signed)
 Patient is still having some constipation and the mirilax that she has been taking isn't helping her, so she would like to try something stronger. She feels like she might have had another stroke.

## 2024-09-08 NOTE — Progress Notes (Signed)
 Patient on oral prednisone  taper  Dr jacobo ok with imfinzi  today

## 2024-09-08 NOTE — Progress Notes (Signed)
 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  INTERVAL HISTORY: Patient returns to clinic today for further evaluation and consideration of cycle 6 of monthly maintenance durvalumab .  She has constipation, but otherwise feels well. She continues to have multiple medical complaints that are all chronic in nature and unchanged.  She continues to have chronic shortness of breath. She has no neurologic complaints.  She denies any recent fevers or illnesses.  She has a good appetite and denies weight loss.  She denies any chest pain, cough, or hemoptysis.  She denies any nausea, vomiting, or diarrhea.  She has no urinary complaints.  Patient offers no further specific complaints today.  REVIEW OF SYSTEMS:   Review of Systems  Constitutional:  Positive for malaise/fatigue. Negative for fever and weight loss.  Respiratory:  Positive for shortness of breath. Negative for cough and hemoptysis.   Cardiovascular: Negative.  Negative for chest pain and leg swelling.  Gastrointestinal:  Positive for constipation. Negative for abdominal pain.  Genitourinary: Negative.  Negative for dysuria.  Musculoskeletal:  Positive for back pain and joint pain.  Skin: Negative.  Negative for rash.  Neurological:  Positive for weakness. Negative for dizziness, focal weakness and headaches.  Psychiatric/Behavioral: Negative.  The patient is not nervous/anxious.     As per HPI. Otherwise, a complete review of systems is negative.  PAST MEDICAL HISTORY: Past Medical History:   Diagnosis Date   Acid reflux    COPD (chronic obstructive pulmonary disease) (HCC)    Diabetes mellitus without complication (HCC)    Hyperlipidemia    Hypertension    Thyroid  disease     PAST SURGICAL HISTORY: Past Surgical History:  Procedure Laterality Date   appendectomy     APPENDECTOMY     BREAST BIOPSY Right    CORE W/CLIP - NEG   ESOPHAGOGASTRODUODENOSCOPY N/A 10/28/2023   Procedure: ESOPHAGOGASTRODUODENOSCOPY (EGD);  Surgeon: Jinny Carmine, MD;  Location: [facility] ENDOSCOPY;  Service: Endoscopy;  Laterality: N/A;   FLEXIBLE BRONCHOSCOPY Bilateral 11/12/2023   Procedure: FLEXIBLE BRONCHOSCOPY;  Surgeon: Parris Manna, MD;  Location: [facility] ORS;  Service: Thoracic;  Laterality: Bilateral;   IR EMBO ART  VEN HEMORR LYMPH EXTRAV  INC GUIDE ROADMAPPING  10/30/2023   IR IMAGING GUIDED PORT INSERTION  01/18/2024   IVC FILTER INSERTION N/A 11/09/2023   Procedure: IVC FILTER INSERTION;  Surgeon: Marea Selinda RAMAN, MD;  Location: [facility] INVASIVE CV LAB;  Service: Cardiovascular;  Laterality: N/A;   TOTAL VAGINAL HYSTERECTOMY     TUMOR REMOVAL     benign;stomach   VIDEO BRONCHOSCOPY WITH ENDOBRONCHIAL NAVIGATION Right 10/20/2023   Procedure: VIDEO BRONCHOSCOPY WITH ENDOBRONCHIAL NAVIGATION;  Surgeon: Parris Manna, MD;  Location: [facility] ORS;  Service: Thoracic;  Laterality: Right;   VIDEO BRONCHOSCOPY WITH ENDOBRONCHIAL NAVIGATION Right 11/12/2023   Procedure: VIDEO BRONCHOSCOPY WITH ENDOBRONCHIAL NAVIGATION;  Surgeon: Parris Manna, MD;  Location: [facility] ORS;  Service: Thoracic;  Laterality: Right;    FAMILY HISTORY: Family History  Problem Relation Age of Onset   Heart attack Mother    Hypertension Mother    Breast cancer Sister 43   Breast cancer Maternal Aunt  40'S   Breast cancer Maternal Grandmother     ADVANCED DIRECTIVES (Y/N):  N  HEALTH MAINTENANCE: Social History   Tobacco Use   Smoking status: Former    Current packs/day: 0.00    Types: Cigarettes    Quit  date: 12/15/1985    Years since quitting: 38.7   Smokeless tobacco: Never  Substance Use Topics   Alcohol use: No   Drug use: No     Colonoscopy:  PAP:  Bone density:  Lipid panel:  Allergies  Allergen Reactions   Penicillins Hives   Aspirin  Other (See Comments)    Lips numb   Sulfa Antibiotics     Current Outpatient Medications  Medication Sig Dispense Refill   acetaminophen  (TYLENOL ) 325 MG tablet Take 650 mg by mouth every 6 (six) hours as needed.     alum & mag hydroxide-simeth (MAALOX PLUS) 400-400-40 MG/5ML suspension Take 10 mLs by mouth every 6 (six) hours as needed for indigestion.     aspirin  EC 81 MG tablet Take 81 mg by mouth daily.     atorvastatin  (LIPITOR) 20 MG tablet Take 20 mg by mouth daily.     busPIRone (BUSPAR) 10 MG tablet Take 20 mg by mouth 2 (two) times daily.     calcium  carbonate (TUMS - DOSED IN MG ELEMENTAL CALCIUM ) 500 MG chewable tablet Chew 1 tablet by mouth daily.     cetirizine (ZYRTEC) 10 MG tablet Take 10 mg by mouth daily.     collagenase (SANTYL) 250 UNIT/GM ointment Apply topically daily. 15 g 0   doxycycline (VIBRA-TABS) 100 MG tablet Take 1 tablet (100 mg total) by mouth every 12 (twelve) hours for 5 days. 10 tablet 0   DULERA 200-5 MCG/ACT AERO Inhale 2 puffs into the lungs every 12 (twelve) hours.     feeding supplement, GLUCERNA SHAKE, (GLUCERNA SHAKE) LIQD Take 237 mLs by mouth 2 (two) times daily between meals. 237 mL 0   ferrous sulfate 325 (65 FE) MG tablet Take 1 tablet (325 mg total) by mouth 2 (two) times daily with a meal. 60 tablet 3   furosemide  (LASIX ) 20 MG tablet Take 1 tablet (20 mg total) by mouth daily. 30 tablet 0   insulin  aspart (NOVOLOG ) 100 UNIT/ML injection Inject 4 Units into the skin 3 (three) times daily with meals.     insulin  glargine-yfgn (SEMGLEE ) 100 UNIT/ML injection Inject 0.18 mLs (18 Units total) into the skin daily.     ipratropium (ATROVENT HFA) 17 MCG/ACT inhaler Inhale 2 puffs into the lungs every  6 (six) hours as needed.     ipratropium-albuterol  (DUONEB) 0.5-2.5 (3) MG/3ML SOLN Take 3 mLs by nebulization 2 (two) times daily.     levETIRAcetam  (KEPPRA ) 500 MG tablet Take 1 tablet (500 mg total) by mouth 2 (two) times daily.     levothyroxine  (SYNTHROID ) 125 MCG tablet Take 125 mcg by mouth every morning.     lidocaine  (LIDODERM ) 5 % Place 1 patch onto the skin daily. Remove & Discard patch within 12 hours or as directed by MD     lidocaine -prilocaine  (EMLA ) cream Apply 1 Application topically once.     losartan (COZAAR) 25 MG tablet Take 25 mg by mouth daily.     meclizine  (ANTIVERT ) 12.5 MG tablet Take 12.5 mg by mouth 2 (two) times daily.     metoprolol succinate (TOPROL-XL) 50 MG 24 hr tablet Take 50 mg by mouth daily. Take with or immediately following a meal.  Multiple Vitamin (MULTIVITAMIN) capsule Take 1 capsule by mouth daily.     naloxone (NARCAN) nasal spray 4 mg/0.1 mL Place 1 spray into the nose.     nutrition supplement, JUVEN, (JUVEN) PACK Take 1 packet by mouth 2 (two) times daily between meals. 30 packet 0   Nutritional Supplements (,FEEDING SUPPLEMENT, PROSOURCE PLUS) liquid Take 30 mLs by mouth 2 (two) times daily between meals. 887 mL 0   nystatin (MYCOSTATIN/NYSTOP) powder Apply 1 Application topically 2 (two) times daily.     ondansetron  (ZOFRAN ) 4 MG/5ML solution Take 8 mg by mouth every 8 (eight) hours as needed for refractory nausea / vomiting.     pantoprazole  (PROTONIX ) 40 MG tablet Take 1 tablet (40 mg total) by mouth 2 (two) times daily before a meal.     polyethylene glycol (MIRALAX  / GLYCOLAX ) 17 g packet Take 17 g by mouth daily.     predniSONE  (DELTASONE ) 20 MG tablet Take 2 tablets (40 mg total) by mouth daily with breakfast for 4 days. 8 tablet 0   pregabalin  (LYRICA ) 50 MG capsule Take 50 mg by mouth 2 (two) times daily.     rOPINIRole  (REQUIP  XL) 4 MG 24 hr tablet Take 1 tablet (4 mg total) by mouth at bedtime.     senna (SENOKOT) 8.6 MG TABS tablet  Take 1 tablet (8.6 mg total) by mouth in the morning and at bedtime. 120 tablet 3   sertraline  (ZOLOFT ) 100 MG tablet Take 100 mg by mouth daily.     TRADJENTA  5 MG TABS tablet Take 5 mg by mouth daily.     traMADol  (ULTRAM ) 50 MG tablet Take 1 tablet (50 mg total) by mouth every 6 (six) hours as needed for severe pain (pain score 7-10). 10 tablet 0   ZINC OXIDE, TOPICAL, 25 % OINT Apply 1 Application topically 2 (two) times daily.     No current facility-administered medications for this visit.   Facility-Administered Medications Ordered in Other Visits  Medication Dose Route Frequency Provider Last Rate Last Admin   0.9 %  sodium chloride  infusion   Intravenous Continuous Jacobo, Demaryius Imran J, MD       durvalumab  (IMFINZI ) 1,500 mg in sodium chloride  0.9 % 100 mL chemo infusion  1,500 mg Intravenous Once Marvin Maenza J, MD       oxyCODONE  (Oxy IR/ROXICODONE ) immediate release tablet 5 mg  5 mg Oral Once Agrawal, Kavita, MD        OBJECTIVE: Vitals:   09/08/24 0904  BP: (!) 120/59  Pulse: 82  Resp: 18  Temp: (!) 96.9 F (36.1 C)  SpO2: 100%      Body mass index is 27.59 kg/m.    ECOG FS:2 - Symptomatic, <50% confined to bed  General: Well-developed, well-nourished, no acute distress.  Sitting in a wheelchair. Eyes: Pink conjunctiva, anicteric sclera. HEENT: Normocephalic, moist mucous membranes. Lungs: No audible wheezing or coughing. Heart: Regular rate and rhythm. Abdomen: Soft, nontender, no obvious distention. Musculoskeletal: No edema, cyanosis, or clubbing. Neuro: Alert, answering all questions appropriately. Cranial nerves grossly intact. Skin: No rashes or petechiae noted. Psych: Normal affect.  LAB RESULTS:  Lab Results  Component Value Date   NA 141 09/08/2024   K 3.8 09/08/2024   CL 98 09/08/2024   CO2 35 (H) 09/08/2024   GLUCOSE 158 (H) 09/08/2024   BUN 26 (H) 09/08/2024   CREATININE 0.51 09/08/2024   CALCIUM  10.1 09/08/2024   PROT 5.6 (L)  09/08/2024   ALBUMIN 2.9 (L)  09/08/2024   AST 16 09/08/2024   ALT 16 09/08/2024   ALKPHOS 76 09/08/2024   BILITOT 0.4 09/08/2024   GFRNONAA >60 09/08/2024   GFRAA >60 08/19/2017    Lab Results  Component Value Date   WBC 5.8 09/08/2024   NEUTROABS 4.8 09/08/2024   HGB 9.4 (L) 09/08/2024   HCT 30.6 (L) 09/08/2024   MCV 81.4 09/08/2024   PLT 195 09/08/2024     STUDIES: DG Chest 1 View Result Date: 09/02/2024 CLINICAL DATA:  Congestive heart failure. EXAM: CHEST  1 VIEW COMPARISON:  09/01/2024 FINDINGS: Stable mild cardiomegaly. Left-sided power port remains in appropriate position. Diffuse pulmonary interstitial infiltrates show no significant change. Poorly defined airspace opacity in the right upper lobe also shows no significant change. No pneumothorax or pleural effusion identified. IMPRESSION: No significant change in diffuse bilateral interstitial infiltrates and right upper lobe airspace opacity. Electronically Signed   By: Norleen DELENA Kil M.D.   On: 09/02/2024 06:25   DG Chest Portable 1 View Result Date: 09/01/2024 EXAM: 1 VIEW(S) XRAY OF THE CHEST 09/01/2024 09:52:00 AM COMPARISON: 08/08/2024 CLINICAL HISTORY: hypoxia FINDINGS: LINES, TUBES AND DEVICES: Stable left internal jugular port-a-cath. LUNGS AND PLEURA: Stable bilateral diffuse interstitial densities are noted concerning for edema or inflammation. Right upper lobe opacity is again noted concerning for neoplasm or possible pneumonia. Minimal pleural effusions may be present. No pneumothorax. HEART AND MEDIASTINUM: Stable cardiomediastinal silhouette. BONES AND SOFT TISSUES: No acute osseous abnormality. IMPRESSION: 1. Stable bilateral diffuse interstitial densities, suggestive of edema or inflammation. 2. Stable right upper lobe opacity, suspicious for neoplasm or pneumonia. 3. Minimal pleural effusions. Electronically signed by: Lynwood Seip MD 09/01/2024 10:26 AM EDT RP Workstation: [id number]     ASSESSMENT: Stage IIIa  non-small cell carcinoma of right upper lobe lung.  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 Tempus liquid biopsy did not reveal any actionable mutations and tumor mutational burden was low.  PET scan results from December 24, 2023 confirmed stage of disease.  MRI of the brain on January 12, 2024 did not reveal any metastatic disease.  She completed her concurrent weekly chemotherapy with carboplatin  and Taxol  along with her daily XRT on approximately Mar 10, 2024.  Her most recent CT scan on May 24, 2024 suggested a mixed response to disease.  PET scan results from July 26, 2024 difficult to interpret but does not report any progressive disease.  Proceed with cycle 6 of durvalumab  today.  Return to clinic in 4 weeks for further evaluation and consideration of cycle 7.  Consider repeat PET scan at the end of December 2025.   Hypercalcemia: Resolved. Shortness of breath: Chronic and unchanged.  Continue oxygen as needed.  Follow-up with pulmonary as scheduled. Pain: Chronic and unchanged.  Patient currently taking Lyrica  50 mg twice per day.   History of right lower extremity DVT: IVC filter placed on November 09, 2023. Anemia: Hemoglobin has trended down slightly to 9.4.  Repeat iron  stores with next lab draw.  She has a history of GI bleed. EGD on October 28, 2023 noted bleeding duodenal ulcer treated with clip placement.  Patient also had embolization with IR on October 30, 2023.  She last received IV Venofer  on Mar 10, 2024. Social situation: Patient was previously given a referral back to social work. Constipation: Continue Senokot.  Patient has also been instructed she can try magnesium  citrate.  Patient expressed understanding  and was in agreement with this plan. She also understands that She can call clinic at any time with any questions, concerns, or complaints.    Cancer Staging  Cancer of upper lobe of  right lung [facility]) Staging form: Lung, AJCC V9 - Clinical stage from 11/12/2023: Stage IIIA (cT3, cN1, cM0) - Signed by Agrawal, Kavita, MD on 01/07/2024   Evalene JINNY Reusing, MD   09/08/2024 9:49 AM

## 2024-09-09 LAB — T4: T4, Total: 6 ug/dL (ref 4.5–12.0)

## 2024-09-13 ENCOUNTER — Encounter: Payer: Self-pay | Admitting: Internal Medicine

## 2024-09-13 ENCOUNTER — Encounter: Payer: Self-pay | Admitting: Oncology

## 2024-09-17 ENCOUNTER — Other Ambulatory Visit: Payer: Self-pay

## 2024-09-17 ENCOUNTER — Emergency Department

## 2024-09-17 ENCOUNTER — Inpatient Hospital Stay
Admission: EM | Admit: 2024-09-17 | Discharge: 2024-09-22 | DRG: 871 | Disposition: A | Discharge status: Skilled Nursing Facility | Source: Skilled Nursing Facility | Attending: Internal Medicine | Admitting: Internal Medicine

## 2024-09-17 DIAGNOSIS — Z880 Allergy status to penicillin: Secondary | ICD-10-CM

## 2024-09-17 DIAGNOSIS — J441 Chronic obstructive pulmonary disease with (acute) exacerbation: Secondary | ICD-10-CM | POA: Diagnosis present

## 2024-09-17 DIAGNOSIS — Z90710 Acquired absence of both cervix and uterus: Secondary | ICD-10-CM

## 2024-09-17 DIAGNOSIS — J439 Emphysema, unspecified: Secondary | ICD-10-CM | POA: Diagnosis present

## 2024-09-17 DIAGNOSIS — Z8249 Family history of ischemic heart disease and other diseases of the circulatory system: Secondary | ICD-10-CM

## 2024-09-17 DIAGNOSIS — Z803 Family history of malignant neoplasm of breast: Secondary | ICD-10-CM

## 2024-09-17 DIAGNOSIS — C3411 Malignant neoplasm of upper lobe, right bronchus or lung: Secondary | ICD-10-CM | POA: Diagnosis present

## 2024-09-17 DIAGNOSIS — N132 Hydronephrosis with renal and ureteral calculous obstruction: Secondary | ICD-10-CM | POA: Diagnosis present

## 2024-09-17 DIAGNOSIS — J9811 Atelectasis: Secondary | ICD-10-CM | POA: Diagnosis present

## 2024-09-17 DIAGNOSIS — Z1152 Encounter for screening for COVID-19: Secondary | ICD-10-CM

## 2024-09-17 DIAGNOSIS — J9622 Acute and chronic respiratory failure with hypercapnia: Secondary | ICD-10-CM | POA: Diagnosis present

## 2024-09-17 DIAGNOSIS — T17590A Other foreign object in bronchus causing asphyxiation, initial encounter: Secondary | ICD-10-CM | POA: Diagnosis present

## 2024-09-17 DIAGNOSIS — E039 Hypothyroidism, unspecified: Secondary | ICD-10-CM | POA: Diagnosis present

## 2024-09-17 DIAGNOSIS — I5022 Chronic systolic (congestive) heart failure: Secondary | ICD-10-CM | POA: Diagnosis present

## 2024-09-17 DIAGNOSIS — Z79890 Hormone replacement therapy (postmenopausal): Secondary | ICD-10-CM

## 2024-09-17 DIAGNOSIS — A419 Sepsis, unspecified organism: Principal | ICD-10-CM | POA: Diagnosis present

## 2024-09-17 DIAGNOSIS — I452 Bifascicular block: Secondary | ICD-10-CM | POA: Diagnosis present

## 2024-09-17 DIAGNOSIS — R6520 Severe sepsis without septic shock: Secondary | ICD-10-CM | POA: Diagnosis present

## 2024-09-17 DIAGNOSIS — F0390 Unspecified dementia without behavioral disturbance: Secondary | ICD-10-CM | POA: Diagnosis present

## 2024-09-17 DIAGNOSIS — Z888 Allergy status to other drugs, medicaments and biological substances status: Secondary | ICD-10-CM

## 2024-09-17 DIAGNOSIS — Z87891 Personal history of nicotine dependence: Secondary | ICD-10-CM

## 2024-09-17 DIAGNOSIS — Z86718 Personal history of other venous thrombosis and embolism: Secondary | ICD-10-CM

## 2024-09-17 DIAGNOSIS — J9621 Acute and chronic respiratory failure with hypoxia: Secondary | ICD-10-CM | POA: Diagnosis present

## 2024-09-17 DIAGNOSIS — I110 Hypertensive heart disease with heart failure: Secondary | ICD-10-CM | POA: Diagnosis present

## 2024-09-17 DIAGNOSIS — G9341 Metabolic encephalopathy: Secondary | ICD-10-CM

## 2024-09-17 DIAGNOSIS — Z8711 Personal history of peptic ulcer disease: Secondary | ICD-10-CM

## 2024-09-17 DIAGNOSIS — J9809 Other diseases of bronchus, not elsewhere classified: Secondary | ICD-10-CM | POA: Diagnosis present

## 2024-09-17 DIAGNOSIS — K219 Gastro-esophageal reflux disease without esophagitis: Secondary | ICD-10-CM | POA: Diagnosis present

## 2024-09-17 DIAGNOSIS — Z79899 Other long term (current) drug therapy: Secondary | ICD-10-CM

## 2024-09-17 DIAGNOSIS — G40909 Epilepsy, unspecified, not intractable, without status epilepticus: Secondary | ICD-10-CM | POA: Diagnosis present

## 2024-09-17 DIAGNOSIS — W44F9XA Other object of natural or organic material, entering into or through a natural orifice, initial encounter: Secondary | ICD-10-CM | POA: Diagnosis present

## 2024-09-17 DIAGNOSIS — Z6829 Body mass index (BMI) 29.0-29.9, adult: Secondary | ICD-10-CM

## 2024-09-17 DIAGNOSIS — J189 Pneumonia, unspecified organism: Secondary | ICD-10-CM | POA: Diagnosis present

## 2024-09-17 DIAGNOSIS — Z9981 Dependence on supplemental oxygen: Secondary | ICD-10-CM

## 2024-09-17 DIAGNOSIS — Z66 Do not resuscitate: Secondary | ICD-10-CM | POA: Diagnosis present

## 2024-09-17 DIAGNOSIS — Z7982 Long term (current) use of aspirin: Secondary | ICD-10-CM

## 2024-09-17 DIAGNOSIS — E1142 Type 2 diabetes mellitus with diabetic polyneuropathy: Secondary | ICD-10-CM | POA: Diagnosis present

## 2024-09-17 DIAGNOSIS — C3491 Malignant neoplasm of unspecified part of right bronchus or lung: Secondary | ICD-10-CM | POA: Diagnosis not present

## 2024-09-17 DIAGNOSIS — E11649 Type 2 diabetes mellitus with hypoglycemia without coma: Secondary | ICD-10-CM | POA: Diagnosis not present

## 2024-09-17 DIAGNOSIS — I5020 Unspecified systolic (congestive) heart failure: Secondary | ICD-10-CM | POA: Insufficient documentation

## 2024-09-17 DIAGNOSIS — Z7984 Long term (current) use of oral hypoglycemic drugs: Secondary | ICD-10-CM

## 2024-09-17 DIAGNOSIS — Z515 Encounter for palliative care: Secondary | ICD-10-CM

## 2024-09-17 DIAGNOSIS — N200 Calculus of kidney: Secondary | ICD-10-CM

## 2024-09-17 DIAGNOSIS — Z794 Long term (current) use of insulin: Secondary | ICD-10-CM

## 2024-09-17 DIAGNOSIS — J440 Chronic obstructive pulmonary disease with acute lower respiratory infection: Secondary | ICD-10-CM | POA: Diagnosis present

## 2024-09-17 DIAGNOSIS — E785 Hyperlipidemia, unspecified: Secondary | ICD-10-CM | POA: Diagnosis present

## 2024-09-17 DIAGNOSIS — C779 Secondary and unspecified malignant neoplasm of lymph node, unspecified: Secondary | ICD-10-CM | POA: Diagnosis present

## 2024-09-17 DIAGNOSIS — Z8673 Personal history of transient ischemic attack (TIA), and cerebral infarction without residual deficits: Secondary | ICD-10-CM

## 2024-09-17 DIAGNOSIS — Z886 Allergy status to analgesic agent status: Secondary | ICD-10-CM

## 2024-09-17 DIAGNOSIS — Z95828 Presence of other vascular implants and grafts: Secondary | ICD-10-CM

## 2024-09-17 DIAGNOSIS — Z7951 Long term (current) use of inhaled steroids: Secondary | ICD-10-CM

## 2024-09-17 DIAGNOSIS — Z882 Allergy status to sulfonamides status: Secondary | ICD-10-CM

## 2024-09-17 DIAGNOSIS — E875 Hyperkalemia: Secondary | ICD-10-CM | POA: Diagnosis not present

## 2024-09-17 DIAGNOSIS — D630 Anemia in neoplastic disease: Secondary | ICD-10-CM | POA: Diagnosis present

## 2024-09-17 HISTORY — DX: Cerebral infarction, unspecified: I63.9

## 2024-09-17 LAB — COMPREHENSIVE METABOLIC PANEL WITH GFR
ALT: 24 U/L (ref 0–44)
AST: 34 U/L (ref 15–41)
Albumin: 3.5 g/dL (ref 3.5–5.0)
Alkaline Phosphatase: 83 U/L (ref 38–126)
Anion gap: 9 (ref 5–15)
BUN: 28 mg/dL — ABNORMAL HIGH (ref 8–23)
CO2: 38 mmol/L — ABNORMAL HIGH (ref 22–32)
Calcium: 11.7 mg/dL — ABNORMAL HIGH (ref 8.9–10.3)
Chloride: 97 mmol/L — ABNORMAL LOW (ref 98–111)
Creatinine, Ser: 0.74 mg/dL (ref 0.44–1.00)
GFR, Estimated: >60 mL/min (ref >60)
Glucose, Bld: 114 mg/dL — ABNORMAL HIGH (ref 70–99)
Potassium: 4.7 mmol/L (ref 3.5–5.1)
Sodium: 143 mmol/L (ref 135–145)
Total Bilirubin: 0.3 mg/dL (ref 0.0–1.2)
Total Protein: 5.8 g/dL — ABNORMAL LOW (ref 6.5–8.1)

## 2024-09-17 LAB — BLOOD GAS, VENOUS
Acid-Base Excess: 15.3 mmol/L — ABNORMAL HIGH (ref 0.0–2.0)
Bicarbonate: 45.9 mmol/L — ABNORMAL HIGH (ref 20.0–28.0)
O2 Saturation: 76.4 %
Patient temperature: 37
pCO2, Ven: 89 mmHg (ref 44–60)
pH, Ven: 7.32 (ref 7.25–7.43)
pO2, Ven: 44 mmHg (ref 32–45)

## 2024-09-17 LAB — URINALYSIS, W/ REFLEX TO CULTURE (INFECTION SUSPECTED)
Bacteria, UA: NONE SEEN
Bilirubin Urine: NEGATIVE
Glucose, UA: NEGATIVE mg/dL
Ketones, ur: NEGATIVE mg/dL
Leukocytes,Ua: NEGATIVE
Nitrite: NEGATIVE
Protein, ur: NEGATIVE mg/dL
Specific Gravity, Urine: 1.035 — ABNORMAL HIGH (ref 1.005–1.030)
pH: 5 (ref 5.0–8.0)

## 2024-09-17 LAB — PROTIME-INR
INR: 1 (ref 0.8–1.2)
Prothrombin Time: 13.6 s (ref 11.4–15.2)

## 2024-09-17 LAB — RESP PANEL BY RT-PCR (RSV, FLU A&B, COVID)  RVPGX2
Influenza A by PCR: NEGATIVE
Influenza B by PCR: NEGATIVE
Resp Syncytial Virus by PCR: NEGATIVE
SARS Coronavirus 2 by RT PCR: NEGATIVE

## 2024-09-17 LAB — CBC WITH DIFFERENTIAL/PLATELET
Abs Immature Granulocytes: 0.05 K/uL (ref 0.00–0.07)
Basophils Absolute: 0 K/uL (ref 0.0–0.1)
Basophils Relative: 0 %
Eosinophils Absolute: 0 K/uL (ref 0.0–0.5)
Eosinophils Relative: 0 %
HCT: 33.5 % — ABNORMAL LOW (ref 36.0–46.0)
Hemoglobin: 10 g/dL — ABNORMAL LOW (ref 12.0–15.0)
Immature Granulocytes: 1 %
Lymphocytes Relative: 7 %
Lymphs Abs: 0.4 K/uL — ABNORMAL LOW (ref 0.7–4.0)
MCH: 25.9 pg — ABNORMAL LOW (ref 26.0–34.0)
MCHC: 29.9 g/dL — ABNORMAL LOW (ref 30.0–36.0)
MCV: 86.8 fL (ref 80.0–100.0)
Monocytes Absolute: 0.8 K/uL (ref 0.1–1.0)
Monocytes Relative: 13 %
Neutro Abs: 4.9 K/uL (ref 1.7–7.7)
Neutrophils Relative %: 79 %
Platelets: 210 K/uL (ref 150–400)
RBC: 3.86 MIL/uL — ABNORMAL LOW (ref 3.87–5.11)
RDW: 17.3 % — ABNORMAL HIGH (ref 11.5–15.5)
WBC: 6.2 K/uL (ref 4.0–10.5)
nRBC: 0 % (ref 0.0–0.2)

## 2024-09-17 LAB — LACTIC ACID, PLASMA
Lactic Acid, Venous: 1.1 mmol/L (ref 0.5–1.9)
Lactic Acid, Venous: 0.5 mmol/L (ref 0.5–1.9)

## 2024-09-17 MED ORDER — INSULIN ASPART 100 UNIT/ML IJ SOLN
4.0000 [IU] | Freq: Three times a day (TID) | INTRAMUSCULAR | Status: DC
  Administered 2024-09-18 – 2024-09-22 (×12): 4 [IU] via SUBCUTANEOUS
  Filled 2024-09-17 (×13): qty 4

## 2024-09-17 MED ORDER — ASPIRIN 81 MG PO TBEC
81.0000 mg | DELAYED_RELEASE_TABLET | Freq: Every day | ORAL | Status: DC
  Administered 2024-09-18 – 2024-09-22 (×5): 81 mg via ORAL
  Filled 2024-09-17 (×5): qty 1

## 2024-09-17 MED ORDER — SODIUM CHLORIDE 0.9 % IV SOLN
2.0000 g | INTRAVENOUS | Status: DC
  Administered 2024-09-18 – 2024-09-19 (×2): 2 g via INTRAVENOUS
  Filled 2024-09-17 (×3): qty 20

## 2024-09-17 MED ORDER — ACETAMINOPHEN 650 MG RE SUPP
650.0000 mg | Freq: Four times a day (QID) | RECTAL | Status: DC | PRN
Start: 2024-09-17 — End: 2024-09-19

## 2024-09-17 MED ORDER — ATORVASTATIN CALCIUM 20 MG PO TABS
20.0000 mg | ORAL_TABLET | Freq: Every day | ORAL | Status: DC
  Administered 2024-09-18 – 2024-09-22 (×5): 20 mg via ORAL
  Filled 2024-09-17 (×5): qty 1

## 2024-09-17 MED ORDER — LACTATED RINGERS IV SOLN: INTRAVENOUS | Status: DC

## 2024-09-17 MED ORDER — LORATADINE 10 MG PO TABS
10.0000 mg | ORAL_TABLET | Freq: Every day | ORAL | Status: DC
  Administered 2024-09-18 – 2024-09-19 (×2): 10 mg via ORAL
  Filled 2024-09-17 (×2): qty 1

## 2024-09-17 MED ORDER — HYDROCOD POLI-CHLORPHE POLI ER 10-8 MG/5ML PO SUER
5.0000 mL | Freq: Two times a day (BID) | ORAL | Status: DC | PRN
  Administered 2024-09-22: 5 mL via ORAL
  Filled 2024-09-17: qty 5

## 2024-09-17 MED ORDER — PREGABALIN 50 MG PO CAPS
50.0000 mg | ORAL_CAPSULE | Freq: Two times a day (BID) | ORAL | Status: DC
  Administered 2024-09-18 – 2024-09-22 (×9): 50 mg via ORAL
  Filled 2024-09-17 (×9): qty 1

## 2024-09-17 MED ORDER — PROSOURCE PLUS PO LIQD
30.0000 mL | Freq: Two times a day (BID) | ORAL | Status: DC
  Administered 2024-09-18 – 2024-09-22 (×7): 30 mL via ORAL

## 2024-09-17 MED ORDER — LACTATED RINGERS IV SOLN
150.0000 mL/h | INTRAVENOUS | Status: DC
  Administered 2024-09-18 (×2): 150 mL/h via INTRAVENOUS

## 2024-09-17 MED ORDER — SODIUM CHLORIDE 0.9 % IV BOLUS
500.0000 mL | Freq: Once | INTRAVENOUS | Status: AC
  Administered 2024-09-17: 500 mL via INTRAVENOUS

## 2024-09-17 MED ORDER — SENNA 8.6 MG PO TABS
1.0000 | ORAL_TABLET | Freq: Two times a day (BID) | ORAL | Status: DC
  Administered 2024-09-18 – 2024-09-22 (×9): 8.6 mg via ORAL
  Filled 2024-09-17 (×9): qty 1

## 2024-09-17 MED ORDER — JUVEN PO PACK
1.0000 | PACK | Freq: Two times a day (BID) | ORAL | Status: DC
  Administered 2024-09-20 – 2024-09-22 (×3): 1 via ORAL

## 2024-09-17 MED ORDER — LOSARTAN POTASSIUM 25 MG PO TABS
25.0000 mg | ORAL_TABLET | Freq: Every day | ORAL | Status: DC
  Administered 2024-09-18 – 2024-09-19 (×2): 25 mg via ORAL
  Filled 2024-09-17 (×2): qty 1

## 2024-09-17 MED ORDER — LIDOCAINE 5 % EX PTCH
1.0000 | MEDICATED_PATCH | CUTANEOUS | Status: DC
  Administered 2024-09-22: 1 via TRANSDERMAL
  Filled 2024-09-17 (×5): qty 1

## 2024-09-17 MED ORDER — METOPROLOL SUCCINATE ER 50 MG PO TB24
50.0000 mg | ORAL_TABLET | Freq: Every day | ORAL | Status: DC
  Administered 2024-09-18 – 2024-09-22 (×5): 50 mg via ORAL
  Filled 2024-09-17 (×5): qty 1

## 2024-09-17 MED ORDER — IPRATROPIUM-ALBUTEROL 0.5-2.5 (3) MG/3ML IN SOLN
3.0000 mL | Freq: Four times a day (QID) | RESPIRATORY_TRACT | Status: DC
  Administered 2024-09-18 – 2024-09-19 (×5): 3 mL via RESPIRATORY_TRACT
  Filled 2024-09-17 (×5): qty 3

## 2024-09-17 MED ORDER — ACETAMINOPHEN 325 MG PO TABS: 650.0000 mg | ORAL_TABLET | Freq: Four times a day (QID) | ORAL | Status: DC | PRN

## 2024-09-17 MED ORDER — PANTOPRAZOLE SODIUM 40 MG PO TBEC
40.0000 mg | DELAYED_RELEASE_TABLET | Freq: Two times a day (BID) | ORAL | Status: DC
  Administered 2024-09-18 – 2024-09-21 (×8): 40 mg via ORAL
  Filled 2024-09-17 (×9): qty 1

## 2024-09-17 MED ORDER — LINAGLIPTIN 5 MG PO TABS
5.0000 mg | ORAL_TABLET | Freq: Every day | ORAL | Status: DC
  Administered 2024-09-18 – 2024-09-22 (×5): 5 mg via ORAL
  Filled 2024-09-17 (×5): qty 1

## 2024-09-17 MED ORDER — ONDANSETRON HCL 4 MG/2ML IJ SOLN
4.0000 mg | Freq: Four times a day (QID) | INTRAMUSCULAR | Status: DC | PRN
Start: 2024-09-17 — End: 2024-09-22

## 2024-09-17 MED ORDER — ADULT MULTIVITAMIN W/MINERALS CH
1.0000 | ORAL_TABLET | Freq: Every day | ORAL | Status: DC
  Administered 2024-09-18 – 2024-09-22 (×5): 1 via ORAL
  Filled 2024-09-17 (×5): qty 1

## 2024-09-17 MED ORDER — LEVETIRACETAM 500 MG PO TABS
500.0000 mg | ORAL_TABLET | Freq: Two times a day (BID) | ORAL | Status: DC
  Administered 2024-09-18 – 2024-09-22 (×9): 500 mg via ORAL
  Filled 2024-09-17 (×9): qty 1

## 2024-09-17 MED ORDER — NYSTATIN 100000 UNIT/GM EX POWD
1.0000 | Freq: Two times a day (BID) | CUTANEOUS | Status: DC
  Administered 2024-09-18 – 2024-09-22 (×8): 1 via TOPICAL
  Filled 2024-09-17 (×3): qty 15

## 2024-09-17 MED ORDER — SODIUM CHLORIDE 0.9 % IV SOLN
500.0000 mg | Freq: Once | INTRAVENOUS | Status: AC
  Administered 2024-09-17: 500 mg via INTRAVENOUS
  Filled 2024-09-17: qty 5

## 2024-09-17 MED ORDER — SODIUM CHLORIDE 0.9 % IV SOLN
500.0000 mg | INTRAVENOUS | Status: DC
  Administered 2024-09-18 – 2024-09-19 (×2): 500 mg via INTRAVENOUS
  Filled 2024-09-17 (×3): qty 5

## 2024-09-17 MED ORDER — ENOXAPARIN SODIUM 40 MG/0.4ML IJ SOSY
40.0000 mg | PREFILLED_SYRINGE | INTRAMUSCULAR | Status: DC
  Administered 2024-09-18 – 2024-09-22 (×5): 40 mg via SUBCUTANEOUS
  Filled 2024-09-17 (×5): qty 0.4

## 2024-09-17 MED ORDER — METHYLPREDNISOLONE SODIUM SUCC 125 MG IJ SOLR
80.0000 mg | INTRAMUSCULAR | Status: DC
  Administered 2024-09-18: 80 mg via INTRAVENOUS
  Filled 2024-09-17: qty 2

## 2024-09-17 MED ORDER — TRAZODONE HCL 50 MG PO TABS: 25.0000 mg | ORAL_TABLET | Freq: Every evening | ORAL | Status: DC | PRN

## 2024-09-17 MED ORDER — GUAIFENESIN ER 600 MG PO TB12
600.0000 mg | ORAL_TABLET | Freq: Two times a day (BID) | ORAL | Status: DC
  Administered 2024-09-18 – 2024-09-19 (×4): 600 mg via ORAL
  Filled 2024-09-17 (×4): qty 1

## 2024-09-17 MED ORDER — COLLAGENASE 250 UNIT/GM EX OINT
1.0000 | TOPICAL_OINTMENT | Freq: Every day | CUTANEOUS | Status: DC
  Administered 2024-09-19 – 2024-09-21 (×3): 1 via TOPICAL
  Filled 2024-09-17 (×2): qty 30

## 2024-09-17 MED ORDER — CALCIUM CARBONATE ANTACID 500 MG PO CHEW
1.0000 | CHEWABLE_TABLET | Freq: Every day | ORAL | Status: DC
  Administered 2024-09-18 – 2024-09-22 (×5): 200 mg via ORAL
  Filled 2024-09-17 (×5): qty 1

## 2024-09-17 MED ORDER — INSULIN ASPART 100 UNIT/ML IJ SOLN
0.0000 [IU] | Freq: Every day | INTRAMUSCULAR | Status: DC
  Administered 2024-09-18 – 2024-09-20 (×3): 2 [IU] via SUBCUTANEOUS
  Administered 2024-09-21: 3 [IU] via SUBCUTANEOUS
  Filled 2024-09-17: qty 2
  Filled 2024-09-17: qty 3
  Filled 2024-09-17 (×2): qty 2

## 2024-09-17 MED ORDER — MECLIZINE HCL 25 MG PO TABS
12.5000 mg | ORAL_TABLET | Freq: Two times a day (BID) | ORAL | Status: DC
  Administered 2024-09-18 – 2024-09-19 (×3): 12.5 mg via ORAL
  Filled 2024-09-17: qty 0.5
  Filled 2024-09-17: qty 1
  Filled 2024-09-17: qty 0.5

## 2024-09-17 MED ORDER — SODIUM CHLORIDE 0.9 % IV BOLUS (SEPSIS)
1000.0000 mL | Freq: Once | INTRAVENOUS | Status: AC
  Administered 2024-09-17: 1000 mL via INTRAVENOUS

## 2024-09-17 MED ORDER — SODIUM CHLORIDE 0.9 % IV BOLUS (SEPSIS)
1250.0000 mL | Freq: Once | INTRAVENOUS | Status: AC
  Administered 2024-09-17: 1250 mL via INTRAVENOUS

## 2024-09-17 MED ORDER — POLYETHYLENE GLYCOL 3350 17 G PO PACK
17.0000 g | PACK | Freq: Every day | ORAL | Status: DC
  Administered 2024-09-18 – 2024-09-22 (×5): 17 g via ORAL
  Filled 2024-09-17 (×5): qty 1

## 2024-09-17 MED ORDER — ZINC OXIDE 20 % EX OINT
1.0000 | TOPICAL_OINTMENT | Freq: Two times a day (BID) | CUTANEOUS | Status: DC
  Administered 2024-09-18 – 2024-09-22 (×8): 1 via TOPICAL
  Filled 2024-09-17 (×2): qty 1
  Filled 2024-09-17: qty 28.35

## 2024-09-17 MED ORDER — BUSPIRONE HCL 10 MG PO TABS
20.0000 mg | ORAL_TABLET | Freq: Two times a day (BID) | ORAL | Status: DC
  Administered 2024-09-18 – 2024-09-22 (×9): 20 mg via ORAL
  Filled 2024-09-17 (×8): qty 2
  Filled 2024-09-17: qty 4

## 2024-09-17 MED ORDER — LEVOTHYROXINE SODIUM 25 MCG PO TABS
125.0000 ug | ORAL_TABLET | Freq: Every day | ORAL | Status: DC
  Administered 2024-09-19 – 2024-09-21 (×3): 125 ug via ORAL
  Filled 2024-09-17 (×4): qty 1

## 2024-09-17 MED ORDER — INSULIN ASPART 100 UNIT/ML IJ SOLN
0.0000 [IU] | Freq: Three times a day (TID) | INTRAMUSCULAR | Status: DC
  Administered 2024-09-18 (×2): 4 [IU] via SUBCUTANEOUS
  Administered 2024-09-18 – 2024-09-19 (×2): 7 [IU] via SUBCUTANEOUS
  Administered 2024-09-19: 3 [IU] via SUBCUTANEOUS
  Administered 2024-09-19 – 2024-09-20 (×2): 7 [IU] via SUBCUTANEOUS
  Administered 2024-09-20: 15 [IU] via SUBCUTANEOUS
  Administered 2024-09-20: 3 [IU] via SUBCUTANEOUS
  Administered 2024-09-21: 20 [IU] via SUBCUTANEOUS
  Administered 2024-09-21: 4 [IU] via SUBCUTANEOUS
  Administered 2024-09-22: 3 [IU] via SUBCUTANEOUS
  Filled 2024-09-17: qty 4
  Filled 2024-09-17: qty 15
  Filled 2024-09-17: qty 20
  Filled 2024-09-17 (×3): qty 3
  Filled 2024-09-17: qty 4
  Filled 2024-09-17: qty 11
  Filled 2024-09-17: qty 4
  Filled 2024-09-17: qty 7
  Filled 2024-09-17: qty 2
  Filled 2024-09-17: qty 7

## 2024-09-17 MED ORDER — TRAMADOL HCL 50 MG PO TABS
50.0000 mg | ORAL_TABLET | Freq: Four times a day (QID) | ORAL | Status: DC | PRN
  Administered 2024-09-19: 50 mg via ORAL
  Filled 2024-09-17: qty 1

## 2024-09-17 MED ORDER — ALUM & MAG HYDROXIDE-SIMETH 200-200-20 MG/5ML PO SUSP: 20.0000 mL | Freq: Four times a day (QID) | ORAL | Status: DC | PRN

## 2024-09-17 MED ORDER — IPRATROPIUM-ALBUTEROL 0.5-2.5 (3) MG/3ML IN SOLN
3.0000 mL | RESPIRATORY_TRACT | Status: DC | PRN
  Administered 2024-09-18 – 2024-09-22 (×2): 3 mL via RESPIRATORY_TRACT
  Filled 2024-09-17: qty 3

## 2024-09-17 MED ORDER — ROPINIROLE HCL ER 4 MG PO TB24
4.0000 mg | ORAL_TABLET | Freq: Every day | ORAL | Status: DC
  Administered 2024-09-18 – 2024-09-21 (×4): 4 mg via ORAL
  Filled 2024-09-17 (×7): qty 1

## 2024-09-17 MED ORDER — GLUCERNA SHAKE PO LIQD
237.0000 mL | Freq: Two times a day (BID) | ORAL | Status: DC
  Administered 2024-09-18 – 2024-09-22 (×6): 237 mL via ORAL

## 2024-09-17 MED ORDER — SODIUM CHLORIDE 0.9 % IV SOLN
2.0000 g | Freq: Once | INTRAVENOUS | Status: AC
  Administered 2024-09-17: 2 g via INTRAVENOUS
  Filled 2024-09-17: qty 20

## 2024-09-17 MED ORDER — FUROSEMIDE 40 MG PO TABS
20.0000 mg | ORAL_TABLET | Freq: Every day | ORAL | Status: DC
  Administered 2024-09-18: 20 mg via ORAL
  Filled 2024-09-17: qty 1

## 2024-09-17 MED ORDER — MAGNESIUM HYDROXIDE 400 MG/5ML PO SUSP: 30.0000 mL | Freq: Every day | ORAL | Status: DC | PRN

## 2024-09-17 MED ORDER — INSULIN GLARGINE-YFGN 100 UNIT/ML ~~LOC~~ SOLN
18.0000 [IU] | Freq: Every day | SUBCUTANEOUS | Status: DC
  Administered 2024-09-18 – 2024-09-22 (×5): 18 [IU] via SUBCUTANEOUS
  Filled 2024-09-17 (×5): qty 0.18

## 2024-09-17 MED ORDER — IOHEXOL 350 MG/ML SOLN
75.0000 mL | Freq: Once | INTRAVENOUS | Status: AC | PRN
  Administered 2024-09-17: 75 mL via INTRAVENOUS

## 2024-09-17 MED ORDER — ONDANSETRON HCL 4 MG PO TABS: 4.0000 mg | ORAL_TABLET | Freq: Four times a day (QID) | ORAL | Status: DC | PRN

## 2024-09-17 MED ORDER — FERROUS SULFATE 325 (65 FE) MG PO TABS
325.0000 mg | ORAL_TABLET | Freq: Two times a day (BID) | ORAL | Status: DC
  Administered 2024-09-18 – 2024-09-21 (×8): 325 mg via ORAL
  Filled 2024-09-17 (×9): qty 1

## 2024-09-17 NOTE — Assessment & Plan Note (Addendum)
-   Will place the patient on supplement coverage with NovoLog . - Will continue basal coverage. - Will continue Tradjenta . - Will continue Lyrica .

## 2024-09-17 NOTE — ED Triage Notes (Signed)
 Pt arrived via EMS from [facility]. Pt send here for CO of 41 and low Hct. EMS reports pt was coughing and SPO2 was 85%. EMS gave albuteral tx and placed pt on 6L of O2 and SPO2  climbed to 98%. Pt baseline is 4L O2.   EMS vitals BP 109/67 HR 98 Temp 98.0 F RR 15 CBG 125

## 2024-09-17 NOTE — ED Notes (Signed)
 This RN notified MD of pCO2 89 lab value

## 2024-09-17 NOTE — Progress Notes (Signed)
 Pt being followed by [facility] for Sepsis protocol.

## 2024-09-17 NOTE — H&P (Signed)
 [place]   PATIENT NAME: Michele House    MR#:  [id number]  DATE OF BIRTH:  08/12/1956  DATE OF ADMISSION:  09/17/2024  PRIMARY CARE PHYSICIAN: Center, [facility]   Patient is coming from: [facility]  REQUESTING/REFERRING PHYSICIAN: Viviann Mungo, MD  CHIEF COMPLAINT:   Chief Complaint  Patient presents with   Abnormal Lab    SNF reported 41    HISTORY OF PRESENT ILLNESS:  Michele House is a 68 y.o. 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  This has been coinciding with worsening dyspnea and hypoxia with a pulse currently of 85% on 4 L O2 by nasal cannula.  The patient has been increased to 8 L by EMS.  She admits to recent cough with expectoration of clear sputum as well as wheezing.  No reported fever or chills.  No report nausea or vomiting or abdominal pain.  No chest pain or palpitations.  No report dysuria, oliguria or hematuria or flank pain.  No bleeding diathesis.  She was admitted here from 10/31 till 09/04/2024 for management of COPD exacerbation and acute on chronic HFrEF with acute on chronic hypoxic and hypercarbic respiratory failure.  ED Course: When the patient came to the ER, temperature was 97.4 and pulse continues 87% on 6 L of O2 by nasal cannula and later 94-96%.Later on respiratory rate was up to 24 and heart rate 95.  The patient's blood pressure has been as low as 81/53 with a MAP of 63 and with hydration is up to 95/56 with a MAP of 69.  VBG showed pH 7.32, pCO2 89 pO2 of 44 and HCO3 45.9.  CBC showed chloride of 97 and CO2 38 with a glucose of 114 BUN of 28 with a creatinine of 0.74 and calcium  10.7.  Total protein was  5.8 with albumin of 3.5.  CBC showed anemia with hemoglobin 10 hematocrit 33.5 close to previous levels with microcytosis.  Lactic acid was 1.1 and later at 0.5.  PT and INR were normal. EKG as reviewed by me : In the ED EKG showed sinus rhythm with a rate of 98 with right bundle branch block and left anterior fascicular block with prolonged PR interval and LVH with T wave inversion in V1 and V2 as well as laterally.. Imaging: Portable chest x-ray showed alveolar consolidation in the right upper lung and diffusely on the left with small left-sided pleural effusion.  The patient was given 2.5 L of IV normal saline bolus, 2 g of IV Rocephin  and 500 mg of IV Zithromax .  She will be admitted to a progressive unit observation bed for further evaluation and management.   PAST MEDICAL HISTORY:   Past Medical History:  Diagnosis Date   Acid reflux    COPD (chronic obstructive pulmonary disease) (HCC)    Diabetes mellitus without complication (HCC)    Hyperlipidemia    Hypertension    Stroke (HCC)    Thyroid  disease   HFrEF with EF 30% in July 2025  PAST SURGICAL HISTORY:   Past Surgical History:  Procedure Laterality Date   appendectomy     APPENDECTOMY     BREAST BIOPSY Right  CORE W/CLIP - NEG   ESOPHAGOGASTRODUODENOSCOPY N/A 10/28/2023   Procedure: ESOPHAGOGASTRODUODENOSCOPY (EGD);  Surgeon: Jinny Carmine, MD;  Location: [facility] ENDOSCOPY;  Service: Endoscopy;  Laterality: N/A;   FLEXIBLE BRONCHOSCOPY Bilateral 11/12/2023   Procedure: FLEXIBLE BRONCHOSCOPY;  Surgeon: Parris Manna, MD;  Location: [facility] ORS;  Service: Thoracic;  Laterality: Bilateral;   IR EMBO ART  VEN HEMORR LYMPH EXTRAV  INC GUIDE ROADMAPPING  10/30/2023   IR IMAGING GUIDED PORT INSERTION  01/18/2024   IVC FILTER INSERTION N/A 11/09/2023   Procedure: IVC FILTER INSERTION;  Surgeon: Marea Selinda RAMAN, MD;  Location: [facility] INVASIVE CV LAB;  Service: Cardiovascular;  Laterality: N/A;   TOTAL VAGINAL HYSTERECTOMY     TUMOR  REMOVAL     benign;stomach   VIDEO BRONCHOSCOPY WITH ENDOBRONCHIAL NAVIGATION Right 10/20/2023   Procedure: VIDEO BRONCHOSCOPY WITH ENDOBRONCHIAL NAVIGATION;  Surgeon: Parris Manna, MD;  Location: [facility] ORS;  Service: Thoracic;  Laterality: Right;   VIDEO BRONCHOSCOPY WITH ENDOBRONCHIAL NAVIGATION Right 11/12/2023   Procedure: VIDEO BRONCHOSCOPY WITH ENDOBRONCHIAL NAVIGATION;  Surgeon: Parris Manna, MD;  Location: [facility] ORS;  Service: Thoracic;  Laterality: Right;    SOCIAL HISTORY:   Social History   Tobacco Use   Smoking status: Former    Current packs/day: 0.00    Types: Cigarettes    Quit date: 12/15/1985    Years since quitting: 38.7   Smokeless tobacco: Never  Substance Use Topics   Alcohol use: No    FAMILY HISTORY:   Family History  Problem Relation Age of Onset   Heart attack Mother    Hypertension Mother    Breast cancer Sister 28   Breast cancer Maternal Aunt        40'S   Breast cancer Maternal Grandmother     DRUG ALLERGIES:   Allergies  Allergen Reactions   Penicillins Hives   Aspirin  Other (See Comments)    Lips numb   Sulfa Antibiotics     REVIEW OF SYSTEMS:   ROS As per history of present illness. All pertinent systems were reviewed above. Constitutional, HEENT, cardiovascular, respiratory, GI, GU, musculoskeletal, neuro, psychiatric, endocrine, integumentary and hematologic systems were reviewed and are otherwise negative/unremarkable except for positive findings mentioned above in the HPI.   MEDICATIONS AT HOME:   Prior to Admission medications   Medication Sig Start Date End Date Taking? Authorizing Provider  acetaminophen  (TYLENOL ) 325 MG tablet Take 650 mg by mouth every 6 (six) hours as needed.    [provider]  alum & mag hydroxide-simeth (MAALOX PLUS) 400-400-40 MG/5ML suspension Take 10 mLs by mouth every 6 (six) hours as needed for indigestion.    [provider]  aspirin  EC 81 MG tablet Take 81 mg by mouth  daily.    [provider]  atorvastatin  (LIPITOR) 20 MG tablet Take 20 mg by mouth daily. 06/25/23   [provider]  busPIRone (BUSPAR) 10 MG tablet Take 20 mg by mouth 2 (two) times daily.    [provider]  calcium  carbonate (TUMS - DOSED IN MG ELEMENTAL CALCIUM ) 500 MG chewable tablet Chew 1 tablet by mouth daily.    [provider]  cetirizine (ZYRTEC) 10 MG tablet Take 10 mg by mouth daily.    [provider]  collagenase (SANTYL) 250 UNIT/GM ointment Apply topically daily. 09/04/24   Patel, Sona, MD  DULERA 200-5 MCG/ACT AERO Inhale 2 puffs into the lungs every 12 (twelve) hours. 09/06/23   [provider]  feeding supplement, GLUCERNA SHAKE, (GLUCERNA  SHAKE) LIQD Take 237 mLs by mouth 2 (two) times daily between meals. 09/04/24   Patel, Sona, MD  ferrous sulfate 325 (65 FE) MG tablet Take 1 tablet (325 mg total) by mouth 2 (two) times daily with a meal. 09/04/24   Tobie Calix, MD  furosemide  (LASIX ) 20 MG tablet Take 1 tablet (20 mg total) by mouth daily. 09/27/23   Shona Terry SAILOR, DO  insulin  aspart (NOVOLOG ) 100 UNIT/ML injection Inject 4 Units into the skin 3 (three) times daily with meals. 12/02/23   Awanda City, MD  insulin  glargine-yfgn (SEMGLEE ) 100 UNIT/ML injection Inject 0.18 mLs (18 Units total) into the skin daily. 12/03/23   Awanda City, MD  ipratropium (ATROVENT HFA) 17 MCG/ACT inhaler Inhale 2 puffs into the lungs every 6 (six) hours as needed.    [provider]  ipratropium-albuterol  (DUONEB) 0.5-2.5 (3) MG/3ML SOLN Take 3 mLs by nebulization 2 (two) times daily.    [provider]  levETIRAcetam  (KEPPRA ) 500 MG tablet Take 1 tablet (500 mg total) by mouth 2 (two) times daily. 12/02/23   Awanda City, MD  levothyroxine  (SYNTHROID ) 125 MCG tablet Take 125 mcg by mouth every morning.    [provider]  lidocaine  (LIDODERM ) 5 % Place 1 patch onto the skin daily. Remove & Discard patch within 12 hours or as  directed by MD    [provider]  lidocaine -prilocaine  (EMLA ) cream Apply 1 Application topically once.    [provider]  losartan (COZAAR) 25 MG tablet Take 25 mg by mouth daily.    [provider]  meclizine  (ANTIVERT ) 12.5 MG tablet Take 12.5 mg by mouth 2 (two) times daily.    [provider]  metoprolol succinate (TOPROL-XL) 50 MG 24 hr tablet Take 50 mg by mouth daily. Take with or immediately following a meal.    [provider]  Multiple Vitamin (MULTIVITAMIN) capsule Take 1 capsule by mouth daily.    [provider]  naloxone [facility]) nasal spray 4 mg/0.1 mL Place 1 spray into the nose.    [provider]  nutrition supplement, JUVEN, (JUVEN) PACK Take 1 packet by mouth 2 (two) times daily between meals. 09/04/24   Patel, Sona, MD  Nutritional Supplements (,FEEDING SUPPLEMENT, PROSOURCE PLUS) liquid Take 30 mLs by mouth 2 (two) times daily between meals. 09/04/24   Patel, Sona, MD  nystatin (MYCOSTATIN/NYSTOP) powder Apply 1 Application topically 2 (two) times daily.    [provider]  ondansetron  (ZOFRAN ) 4 MG/5ML solution Take 8 mg by mouth every 8 (eight) hours as needed for refractory nausea / vomiting.    [provider]  pantoprazole  (PROTONIX ) 40 MG tablet Take 1 tablet (40 mg total) by mouth 2 (two) times daily before a meal. 12/02/23   Awanda City, MD  polyethylene glycol (MIRALAX  / GLYCOLAX ) 17 g packet Take 17 g by mouth daily. 12/03/23   Awanda City, MD  pregabalin  (LYRICA ) 50 MG capsule Take 50 mg by mouth 2 (two) times daily.    [provider]  rOPINIRole  (REQUIP  XL) 4 MG 24 hr tablet Take 1 tablet (4 mg total) by mouth at bedtime. 12/02/23   Awanda City, MD  senna (SENOKOT) 8.6 MG TABS tablet Take 1 tablet (8.6 mg total) by mouth in the morning and at bedtime. 03/03/24   Agrawal, Kavita, MD  sertraline  (ZOLOFT ) 100 MG tablet Take 100 mg by mouth daily. 06/28/23   [provider]   TRADJENTA  5 MG TABS tablet Take 5  mg by mouth daily. 07/26/23   [provider]  traMADol  (ULTRAM ) 50 MG tablet Take 1 tablet (50 mg total) by mouth every 6 (six) hours as needed for severe pain (pain score 7-10). 09/04/24   Patel, Sona, MD  ZINC OXIDE, TOPICAL, 25 % OINT Apply 1 Application topically 2 (two) times daily.    [provider]      VITAL SIGNS:  Blood pressure (!) 95/56, pulse 94, temperature (!) 97.4 F (36.3 C), temperature source Oral, resp. rate 18, height 5' 1 (1.549 m), weight 70.2 kg, last menstrual period 12/24/1998, SpO2 96%.  PHYSICAL EXAMINATION:  Physical Exam  GENERAL:  68 y.o.-year-old patient lying in the bed with no acute distress.  EYES: Pupils equal, round, reactive to light and accommodation. No scleral icterus. Extraocular muscles intact.  HEENT: Head atraumatic, normocephalic. Oropharynx and nasopharynx clear.  NECK:  Supple, no jugular venous distention. No thyroid  enlargement, no tenderness.  LUNGS: Diminished bibasilar breath sounds with bibasal and midlung zone crackles.  She has associated expiratory wheezes with diminished expiratory airflow and heart vesicular breathing.  No use of accessory muscles of respiration.  CARDIOVASCULAR: Regular rate and rhythm, S1, S2 normal. No murmurs, rubs, or gallops.  ABDOMEN: Soft, nondistended, nontender. Bowel sounds present. No organomegaly or mass.  EXTREMITIES: No pedal edema, cyanosis, or clubbing.  NEUROLOGIC: Cranial nerves II through XII are intact. Muscle strength 5/5 in all extremities. Sensation intact. Gait not checked.  PSYCHIATRIC: The patient is alert and oriented x 3.  Normal affect and good eye contact. SKIN: No obvious rash, lesion, or ulcer.   LABORATORY PANEL:   CBC Recent Labs  Lab 09/17/24 1855  WBC 6.2  HGB 10.0*  HCT 33.5*  PLT 210   ------------------------------------------------------------------------------------------------------------------  Chemistries   Recent Labs  Lab 09/17/24 1855  NA 143  K 4.7  CL 97*  CO2 38*  GLUCOSE 114*  BUN 28*  CREATININE 0.74  CALCIUM  11.7*  AST 34  ALT 24  ALKPHOS 83  BILITOT 0.3   ------------------------------------------------------------------------------------------------------------------  Cardiac Enzymes No results for input(s): TROPONINI in the last 168 hours. ------------------------------------------------------------------------------------------------------------------  RADIOLOGY:  CT Angio Chest PE W and/or Wo Contrast Result Date: 09/17/2024 EXAM: CTA CHEST 09/17/2024 10:07:39 PM TECHNIQUE: CTA of the chest was performed after the administration of intravenous contrast. Multiplanar reformatted images are provided for review. MIP images are provided for review. Automated exposure control, iterative reconstruction, and/or weight based adjustment of the mA/kV was utilized to reduce the radiation dose to as low as reasonably achievable. COMPARISON: None available. CLINICAL HISTORY: Lung cancer patient with right upper lobe neoplasm presenting with hypoxemia and respiratory distress. Pulmonary embolism (PE) suspected, high prob. FINDINGS: PULMONARY ARTERIES: Pulmonary arteries are adequately opacified for evaluation. The pulmonary arteries are slightly prominent but no arterial embolus is seen. MEDIASTINUM: There is mild cardiomegaly with a slight right chamber predominance with RV/LV ratio of 1.06. There is no pericardial effusion. There is atherosclerosis in the aorta, great vessels, small amount in the left main and proximal LAD coronary artery. No aortic dissection or aneurysm. The pulmonary veins are nondilated. There is mild chronic enlargement and heterogeneity of the thyroid  gland without a dominant nodule. There is a chronically patulous esophagus without wall thickening. IVC filter in place. Left chest mediport with IJ approach catheter terminating in the SVC. LYMPH NODES: Subcarinal  adenopathy is unchanged up to 1.2 cm on axial 58 of series 6, with increased prominence of AP window and left hilar nodes up to  1.2 cm, increased prominence of right hilar nodes up to 1.5 cm on axial 67. LUNGS AND PLEURA: Lungs are emphysematous. There is mild layering fluid in the distal trachea. There is layering fluid in the bilateral main bronchi, on the left extending into and obstructing the main lower lobe and lower lobe segmental bronchi, with consolidation throughout the left lower lobe consistent with a consolidated pneumonia. There is a right upper lobe suprahilar mass encasing central bronchovascular structures with bronchial narrowing. It is difficult to distinguish between the mass and adjacent pulmonary consolidation. In total, the mass and consolidation today measure 5 x 6.6 cm on axial 47 of series 6, not grossly changed from the PET CT but certainly significantly larger than on 05/24/2024. There is surrounding spiculation and stranding to pleural surfaces and areas of pleural thickening in the right chest superiorly and medially probably due to pleural metastatic disease and hypermetabolic on the PET CT. There is a small layering right pleural effusion with partial loculation in the superior chest. In the right lower lobe, several coarsely nodular opacities are again noted, consistent with patchy airspace disease versus intraorgan metastases. The largest of these is 2.3 x 1.9 cm on series 8 axial 82, unchanged. Elsewhere, there is progressive diffuse interstitial thickening and micronodularity relatively sparing the right middle lobe and first seen on PET CT but worsened since. 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 No pneumothorax. UPPER ABDOMEN: The gallbladder is dilated and there are tiny layering stones posteriorly without wall thickening. There are stones in the right renal collecting system and renal pelvis,  largest is 6 mm just above the UPJ. There is mild right hydronephrosis. The liver is steatotic. There is a 2 cm Bosniak 1 cyst in the right upper pole. SOFT TISSUES AND BONES: There is osteopenia and kyphosis of the thoracic spine. No destructive bone lesions. No acute soft tissue abnormality. IMPRESSION: 1. No evidence of pulmonary embolism. 2. Left lower lobe consolidation with mucus impaction and complete obstruction of the left lower lobe bronchus, consistent with pneumonia. 3. Cardiomegaly with right chamber predominance indicating right heart dysfunction. the pulmonary arteries are slightly prominent. 4. Fluid in the main bronchi, obstructing the left lower lobe main and segmental bronchi with complete consolidation/ atelectasis of the left lower lobe. 5. Right upper lobe suprahilar mass with bronchovascular encasement and bronchial narrowing, overall size approximately 5 x 6.6 cm, not grossly changed from recent PET CT but increased from 05/24/2024, with associated pleural thickening suspicious for pleural metastatic disease and small partially loculated right pleural effusion . 6. 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 7. Mediastinal and hilar adenopathy with interval increase in AP window and bilateral hilar nodes up to 1.5 cm and stable subcarinal node up to 1.2 cm. 8. Right nephrolithiasis including a 6 mm renal pelvis just above the UPJ with mild right hydronephrosis. 9. Emphysema, with aortic and coronary atherosclerosis. Electronically signed by: Francis Quam MD 09/17/2024 10:53 PM EST RP Workstation: [id number]   DG Chest Port 1 View Result Date: 09/17/2024 EXAM: 1 VIEW(S) XRAY OF THE CHEST 09/17/2024 07:45:00 PM COMPARISON: 09/02/2024 CLINICAL HISTORY: Questionable sepsis - evaluate for abnormality FINDINGS: LINES, TUBES AND DEVICES: Left-sided port-a-cath tip overlies mid SVC. LUNGS AND PLEURA: Alveolar consolidation  right upper lung and diffusely on the left. Small left-sided pleural effusion. No pneumothorax. HEART AND MEDIASTINUM: No acute abnormality of the cardiac and mediastinal silhouettes. BONES AND SOFT TISSUES: No acute  osseous abnormality. IMPRESSION: 1. Alveolar consolidation in the right upper lung and diffusely on the left. 2. Small left-sided pleural effusion. Electronically signed by: Fonda Field MD 09/17/2024 07:48 PM EST RP Workstation: [id number]      IMPRESSION AND PLAN:  Assessment and Plan: * Sepsis due to pneumonia [facility] - The patient will be admitted to a medical telemetry bed. - Will continue antibiotic therapy with IV Rocephin  and Zithromax . - Mucolytic therapy be provided as well as duo nebs q.i.d. and q.4 hours p.r.n. - We will follow blood cultures. -Sepsis manifested by tachycardia and tachypnea. - Will continue hydration with IV lactated ringer . - She meets criteria for severe sepsis given her hypotension that is improving with hydration.  COPD with acute exacerbation (HCC) - We will place the patient IV steroid therapy with IV Solu-Medrol  as well as nebulized bronchodilator therapy with duonebs q.i.d. and q.4 hours p.r.n.SABRA - Mucolytic therapy will be provided with Mucinex  and antibiotic therapy with IV Rocephin  and Zithromax . - O2 protocol will be followed.   Acute on chronic respiratory failure with hypoxia and hypercapnia (HCC) - O2 protocol be followed. - This is like secondary to #1 and #2.  Type 2 diabetes mellitus with peripheral neuropathy (HCC) - Will place the patient on supplement coverage with NovoLog . - Will continue basal coverage. - Will continue Tradjenta . - Will continue Lyrica .  HFrEF (heart failure with reduced ejection fraction) (HCC) Will have to hold off Aldactone given her hypotension. - Will continue Toprol-XL and Tradjenta .  Dyslipidemia - Will continue statin therapy.  Seizure disorder (HCC) - Will continue Keppra .   DVT  prophylaxis: Lovenox .  Advanced Care Planning:  Code Status: full code.  Family Communication:  The plan of care was discussed in details with the patient (and family). I answered all questions. The patient agreed to proceed with the above mentioned plan. Further management will depend upon hospital course. Disposition Plan: Back to previous home environment Consults called: none.  All the records are reviewed and case discussed with ED provider.  Status is: Inpatient   At the time of the admission, it appears that the appropriate admission status for this patient is inpatient.  This is judged to be reasonable and necessary in order to provide the required intensity of service to ensure the patient's safety given the presenting symptoms, physical exam findings and initial radiographic and laboratory data in the context of comorbid conditions.  The patient requires inpatient status due to high intensity of service, high risk of further deterioration and high frequency of surveillance required.  I certify that at the time of admission, it is my clinical judgment that the patient will require inpatient hospital care extending more than 2 midnights.                            Dispo: The patient is from: [facility]              Anticipated d/c is to: [facility]              Patient currently is not medically stable to d/c.              Difficult to place patient: No  Madison DELENA Peaches M.D on 09/17/2024 at 11:55 PM  [facility]   From 7 PM-7 AM, contact night-coverage [email or web address]  CC: Primary care physician; [facility], [facility]

## 2024-09-17 NOTE — Assessment & Plan Note (Signed)
-   The patient will be admitted to a medical telemetry bed. - Will continue antibiotic therapy with IV Rocephin  and Zithromax . - Mucolytic therapy be provided as well as duo nebs q.i.d. and q.4 hours p.r.n. - We will follow blood cultures. -Sepsis manifested by tachycardia and tachypnea. - Will continue hydration with IV lactated ringer . - She meets criteria for severe sepsis given her hypotension that is improving with hydration.

## 2024-09-17 NOTE — Assessment & Plan Note (Signed)
-   Will place the patient on supplement coverage with NovoLog . - Will continue basal coverage. - Will continue Tradjenta . - Will continue Lyrica .

## 2024-09-17 NOTE — ED Provider Notes (Addendum)
 [facility] Provider Note    Event Date/Time   First MD Initiated Contact with Patient 09/17/24 1909     (approximate)   History   Chief Complaint: Abnormal Lab (SNF reported 24)   HPI  Michele House is a 68 y.o. female with a history of COPD on 4 L nasal cannula at all times, hypertension, diabetes who was sent to the ED due to shortness of breath, cough, oxygen saturation of 85% on her usual 4 L nasal cannula.  Patient denies pain.       Past Medical History:  Diagnosis Date   Acid reflux    COPD (chronic obstructive pulmonary disease) (HCC)    Diabetes mellitus without complication (HCC)    Hyperlipidemia    Hypertension    Stroke [facility])    Thyroid  disease     Current Outpatient Rx   Order #: [id number] Class: Historical Med   Order #: [id number] Class: Historical Med   Order #: [id number] Class: Historical Med   Order #: [id number] Class: Historical Med   Order #: [id number] Class: Historical Med   Order #: [id number] Class: Historical Med   Order #: [id number] Class: Historical Med   Order #: [id number] Class: Normal   Order #: [id number] Class: Historical Med   Order #: [id number] Class: Normal   Order #: [id number] Class: Normal   Order #: [id number] Class: Normal   Order #: [id number] Class: No Print   Order #: [id number] Class: No Print   Order #: [id number] Class: Historical Med   Order #: [id number] Class: Historical Med   Order #: [id number] Class: No Print   Order #: [id number] Class: Historical Med   Order #: [id number] Class: Historical Med   Order #: [id number] Class: Historical Med   Order #: [id number] Class: Historical Med   Order #: [id number] Class: Historical Med   Order #: [id number] Class: Historical Med   Order #: [id number] Class: Historical Med   Order #: [id number] Class: Historical Med   Order #: [id number] Class: Normal   Order #: [id number] Class: Normal   Order #: [id number] Class: Historical Med   Order #: [id number] Class: Historical Med   Order #:  [id number] Class: No Print   Order #: [id number] Class: No Print   Order #: [id number] Class: Historical Med   Order #: [id number] Class: No Print   Order #: [id number] Class: Normal   Order #: [id number] Class: Historical Med   Order #: [id number] Class: Historical Med   Order #: [id number] Class: Print   Order #: [id number] Class: Historical Med    Past Surgical History:  Procedure Laterality Date   appendectomy     APPENDECTOMY     BREAST BIOPSY Right    CORE W/CLIP - NEG   ESOPHAGOGASTRODUODENOSCOPY N/A 10/28/2023   Procedure: ESOPHAGOGASTRODUODENOSCOPY (EGD);  Surgeon: Jinny Carmine, MD;  Location: [facility] ENDOSCOPY;  Service: Endoscopy;  Laterality: N/A;   FLEXIBLE BRONCHOSCOPY Bilateral 11/12/2023   Procedure: FLEXIBLE BRONCHOSCOPY;  Surgeon: Parris Manna, MD;  Location: [facility] ORS;  Service: Thoracic;  Laterality: Bilateral;   IR EMBO ART  VEN HEMORR LYMPH EXTRAV  INC GUIDE ROADMAPPING  10/30/2023   IR IMAGING GUIDED PORT INSERTION  01/18/2024   IVC FILTER INSERTION N/A 11/09/2023   Procedure: IVC FILTER INSERTION;  Surgeon: Marea Selinda RAMAN, MD;  Location: [facility] INVASIVE CV LAB;  Service: Cardiovascular;  Laterality: N/A;   TOTAL VAGINAL HYSTERECTOMY     TUMOR REMOVAL     benign;stomach   VIDEO BRONCHOSCOPY WITH ENDOBRONCHIAL NAVIGATION Right 10/20/2023   Procedure: VIDEO BRONCHOSCOPY WITH ENDOBRONCHIAL NAVIGATION;  Surgeon: Parris Manna, MD;  Location: [facility] ORS;  Service: Thoracic;  Laterality: Right;   VIDEO  BRONCHOSCOPY WITH ENDOBRONCHIAL NAVIGATION Right 11/12/2023   Procedure: VIDEO BRONCHOSCOPY WITH ENDOBRONCHIAL NAVIGATION;  Surgeon: Parris Manna, MD;  Location: [facility] ORS;  Service: Thoracic;  Laterality: Right;    Physical Exam   Triage Vital Signs: ED Triage Vitals  Encounter Vitals Group     BP 09/17/24 1843 99/64     Girls Systolic BP Percentile --      Girls Diastolic BP Percentile --      Boys Systolic BP Percentile --      Boys Diastolic BP Percentile --      Pulse Rate  09/17/24 1843 98     Resp 09/17/24 1843 14     Temp 09/17/24 1843 (!) 97.4 F (36.3 C)     Temp Source 09/17/24 1843 Oral     SpO2 09/17/24 1843 (!) 87 %     Weight 09/17/24 1846 154 lb 12.2 oz (70.2 kg)     Height 09/17/24 1846 5' 1 (1.549 m)     Head Circumference --      [place] Flow --      Pain Score 09/17/24 1845 0     Pain Loc --      Pain Education --      Exclude from Growth Chart --     Most recent vital signs: Vitals:   09/17/24 2230 09/17/24 2300  BP: (!) 89/57 (!) 95/56  Pulse: 92 94  Resp: 18   Temp:    SpO2: 95% 96%    General: Awake, ill-appearing.  Disoriented to time CV:  Good peripheral perfusion.  Tachycardia heart rate 100.  Symmetric distal pulses Resp:  Tachypnea.  Oxygenation normalized on 6 L nasal cannula.  Diminished breath sounds in the right lung. Abd:  No distention.  Soft nontender Other:  Dry oral mucosa.  No lower extremity edema.  Mild conjunctival pallor.  No jaundice   ED Results / Procedures / Treatments   Labs (all labs ordered are listed, but only abnormal results are displayed) Labs Reviewed  COMPREHENSIVE METABOLIC PANEL WITH GFR - Abnormal; Notable for the following components:      Result Value   Chloride 97 (*)    CO2 38 (*)    Glucose, Bld 114 (*)    BUN 28 (*)    Calcium  11.7 (*)    Total Protein 5.8 (*)    All other components within normal limits  CBC WITH DIFFERENTIAL/PLATELET - Abnormal; Notable for the following components:   RBC 3.86 (*)    Hemoglobin 10.0 (*)    HCT 33.5 (*)    MCH 25.9 (*)    MCHC 29.9 (*)    RDW 17.3 (*)    Lymphs Abs 0.4 (*)    All other components within normal limits  BLOOD GAS, VENOUS - Abnormal; Notable for the following components:   pCO2, Ven 89 (*)    Bicarbonate 45.9 (*)    Acid-Base Excess 15.3 (*)    All other components within normal limits  URINALYSIS, W/ REFLEX TO CULTURE (INFECTION SUSPECTED) - Abnormal; Notable for the following components:   Color, Urine YELLOW (*)     APPearance HAZY (*)    Specific Gravity, Urine 1.035 (*)    Hgb urine dipstick SMALL (*)    All other components within normal limits  RESP PANEL BY RT-PCR (RSV, FLU A&B, COVID)  RVPGX2  CULTURE, BLOOD (ROUTINE X 2)  CULTURE, BLOOD (ROUTINE X 2)  LACTIC ACID, PLASMA  LACTIC ACID, PLASMA  PROTIME-INR  PROTIME-INR  CORTISOL-AM, BLOOD  BASIC METABOLIC PANEL WITH GFR  CBC     EKG Interpreted by me Sinus rhythm rate of 98.  Left axis, first-degree AV block.  Right bundle branch block.  No acute ischemic changes.   RADIOLOGY Chest x-ray interpreted by me, shows opacity of the right upper lung and diffuse left lung consistent with bilateral pneumonia.  Radiology report reviewed   PROCEDURES:  .Critical Care  Performed by: Viviann Pastor, MD Authorized by: Viviann Pastor, MD   Critical care provider statement:    Critical care time (minutes):  35   Critical care time was exclusive of:  Separately billable procedures and treating other patients   Critical care was necessary to treat or prevent imminent or life-threatening deterioration of the following conditions:  Sepsis and respiratory failure   Critical care was time spent personally by me on the following activities:  Development of treatment plan with patient or surrogate, discussions with consultants, evaluation of patient's response to treatment, examination of patient, obtaining history from patient or surrogate, ordering and performing treatments and interventions, ordering and review of laboratory studies, ordering and review of radiographic studies, pulse oximetry, re-evaluation of patient's condition and review of old charts   Care discussed with: admitting provider      MEDICATIONS ORDERED IN ED: Medications  aspirin  EC tablet 81 mg (has no administration in time range)  traMADol  (ULTRAM ) tablet 50 mg (has no administration in time range)  atorvastatin  (LIPITOR) tablet 20 mg (has no administration in time range)   furosemide  (LASIX ) tablet 20 mg (has no administration in time range)  losartan (COZAAR) tablet 25 mg (has no administration in time range)  metoprolol succinate (TOPROL-XL) 24 hr tablet 50 mg (has no administration in time range)  busPIRone (BUSPAR) tablet 20 mg (has no administration in time range)  insulin  aspart (novoLOG ) injection 4 Units (has no administration in time range)  insulin  glargine-yfgn (SEMGLEE ) injection 18 Units (has no administration in time range)  levothyroxine  (SYNTHROID ) tablet 125 mcg (has no administration in time range)  linagliptin  (TRADJENTA ) tablet 5 mg (has no administration in time range)  alum & mag hydroxide-simeth (MAALOX/MYLANTA) 200-200-20 MG/5ML suspension 20 mL (has no administration in time range)  calcium  carbonate (TUMS - dosed in mg elemental calcium ) chewable tablet 200 mg of elemental calcium  (has no administration in time range)  meclizine  (ANTIVERT ) tablet 12.5 mg (has no administration in time range)  pantoprazole  (PROTONIX ) EC tablet 40 mg (has no administration in time range)  polyethylene glycol (MIRALAX  / GLYCOLAX ) packet 17 g (has no administration in time range)  senna (SENOKOT) tablet 8.6 mg (has no administration in time range)  ferrous sulfate tablet 325 mg (has no administration in time range)  levETIRAcetam  (KEPPRA ) tablet 500 mg (has no administration in time range)  pregabalin  (LYRICA ) capsule 50 mg (has no administration in time range)  rOPINIRole  (REQUIP  XL) 24 hr tablet 4 mg (has no administration in time range)  multivitamin with minerals tablet 1 tablet (has no administration in time range)  feeding supplement (GLUCERNA SHAKE) (GLUCERNA SHAKE) liquid 237 mL (has no administration in time range)  nutrition supplement (JUVEN) (JUVEN) powder packet 1 packet (has no administration in time range)  (feeding supplement) PROSource Plus liquid 30 mL (has no administration in time range)  loratadine  (CLARITIN ) tablet 10 mg (has no  administration in time range)  leptospermum manuka honey (MEDIHONEY) paste 1 Application (has no administration in time range)  lidocaine  (LIDODERM ) 5 % 1 patch (has no administration  in time range)  ZINC OXIDE (TOPICAL) 25 % OINT 1 Application (has no administration in time range)  nystatin (MYCOSTATIN/NYSTOP) topical powder 1 Application (has no administration in time range)  enoxaparin  (LOVENOX ) injection 40 mg (has no administration in time range)  lactated ringers  infusion (has no administration in time range)  cefTRIAXone  (ROCEPHIN ) 2 g in sodium chloride  0.9 % 100 mL IVPB (has no administration in time range)  azithromycin  (ZITHROMAX ) 500 mg in sodium chloride  0.9 % 250 mL IVPB (has no administration in time range)  acetaminophen  (TYLENOL ) tablet 650 mg (has no administration in time range)    Or  acetaminophen  (TYLENOL ) suppository 650 mg (has no administration in time range)  traZODone (DESYREL) tablet 25 mg (has no administration in time range)  magnesium  hydroxide (MILK OF MAGNESIA) suspension 30 mL (has no administration in time range)  ondansetron  (ZOFRAN ) tablet 4 mg (has no administration in time range)    Or  ondansetron  (ZOFRAN ) injection 4 mg (has no administration in time range)  guaiFENesin  (MUCINEX ) 12 hr tablet 600 mg (has no administration in time range)  chlorpheniramine-HYDROcodone  (TUSSIONEX) 10-8 MG/5ML suspension 5 mL (has no administration in time range)  ipratropium-albuterol  (DUONEB) 0.5-2.5 (3) MG/3ML nebulizer solution 3 mL (has no administration in time range)  ipratropium-albuterol  (DUONEB) 0.5-2.5 (3) MG/3ML nebulizer solution 3 mL (has no administration in time range)  sodium chloride  0.9 % bolus 1,000 mL (0 mLs Intravenous Stopped 09/17/24 2115)  cefTRIAXone  (ROCEPHIN ) 2 g in sodium chloride  0.9 % 100 mL IVPB (0 g Intravenous Stopped 09/17/24 1954)  azithromycin  (ZITHROMAX ) 500 mg in sodium chloride  0.9 % 250 mL IVPB (0 mg Intravenous Stopped 09/17/24  2114)  sodium chloride  0.9 % bolus 1,250 mL (0 mLs Intravenous Stopped 09/17/24 2324)  iohexol  (OMNIPAQUE ) 350 MG/ML injection 75 mL (75 mLs Intravenous Contrast Given 09/17/24 2158)  sodium chloride  0.9 % bolus 500 mL (500 mLs Intravenous New Bag/Given 09/17/24 2316)     IMPRESSION / MDM / ASSESSMENT AND PLAN / ED COURSE  I reviewed the triage vital signs and the nursing notes.  DDx: Pneumonia, pleural effusion, pulmonary edema, sepsis, NSTEMI, dehydration, AKI, electrolyte derangement, anemia  Patient's presentation is most consistent with acute presentation with potential threat to life or bodily function.  Patient presents with shortness of breath, tachycardia, acute on chronic hypoxia, concerning for respiratory illness and sepsis.  VBG shows chronic hypercapnia, I doubt CO2 narcosis as a cause of her disorientation to time.  I think the altered mental status is related to acute illness and delirium.  Will start antibiotics give IV fluids for dehydration, plan to admit.   Clinical Course as of 09/17/24 2344  Sun Sep 17, 2024  2059 Lactate normal but blood pressure has become hypotensive.  Will give additional fluids for 30 mL/kg total fluid dose, 2250 mL. [PS]  2246 CT chest shows left lower lobe consolidation.  No central PE. [PS]  2253 Last BP 95/60.  Sepsis reassessment completed, no refractory shock, vasopressors not indicated. [PS]  2344 Case discussed with hospitalist [PS]    Clinical Course User Index [PS] Viviann Pastor, MD     FINAL CLINICAL IMPRESSION(S) / ED DIAGNOSES   Final diagnoses:  Sepsis with acute hypoxic respiratory failure and septic shock, due to unspecified organism [facility])  Acute on chronic respiratory failure with hypoxia and hypercapnia (HCC)  Primary malignant neoplasm of right lung metastatic to other site [facility])     Rx / DC Orders   ED Discharge Orders  None        Note:  This document was prepared using Dragon voice recognition  software and may include unintentional dictation errors.   Viviann Pastor, MD 09/17/24 7680    Viviann Pastor, MD 09/17/24 [phone]

## 2024-09-17 NOTE — Assessment & Plan Note (Signed)
-   We will place the patient IV steroid therapy with IV Solu-Medrol as well as nebulized bronchodilator therapy with duonebs q.i.d. and q.4 hours p.r.n.Marland Kitchen - Mucolytic therapy will be provided with Mucinex and antibiotic therapy with IV Rocephin and Zithromax. - O2 protocol will be followed.

## 2024-09-17 NOTE — Assessment & Plan Note (Signed)
 Will have to hold off Aldactone given her hypotension. - Will continue Toprol-XL and Tradjenta .

## 2024-09-17 NOTE — Assessment & Plan Note (Signed)
-   Will continue Keppra.

## 2024-09-17 NOTE — Assessment & Plan Note (Signed)
 Will continue statin therapy

## 2024-09-17 NOTE — Assessment & Plan Note (Signed)
-   O2 protocol be followed. - This is like secondary to #1 and #2.

## 2024-09-17 NOTE — ED Triage Notes (Signed)
 Pt arrived via EMS from [facility]. Pt send here for CO2 of 41 and low Hct. EMS reports pt was coughing and SPO2 was 85%. EMS gave albuteral tx and placed pt on 6L of O2 and SPO2  climbed to 98%. Pt baseline is 4L O2.    EMS vitals BP 109/67 HR 98 Temp 98.0 F RR 15 CBG 125

## 2024-09-18 DIAGNOSIS — E1142 Type 2 diabetes mellitus with diabetic polyneuropathy: Secondary | ICD-10-CM | POA: Diagnosis present

## 2024-09-18 DIAGNOSIS — Z7189 Other specified counseling: Secondary | ICD-10-CM | POA: Diagnosis not present

## 2024-09-18 DIAGNOSIS — C3411 Malignant neoplasm of upper lobe, right bronchus or lung: Secondary | ICD-10-CM | POA: Diagnosis present

## 2024-09-18 DIAGNOSIS — C779 Secondary and unspecified malignant neoplasm of lymph node, unspecified: Secondary | ICD-10-CM | POA: Diagnosis present

## 2024-09-18 DIAGNOSIS — A419 Sepsis, unspecified organism: Secondary | ICD-10-CM | POA: Diagnosis present

## 2024-09-18 DIAGNOSIS — N200 Calculus of kidney: Secondary | ICD-10-CM | POA: Diagnosis not present

## 2024-09-18 DIAGNOSIS — Z66 Do not resuscitate: Secondary | ICD-10-CM | POA: Diagnosis present

## 2024-09-18 DIAGNOSIS — T17590A Other foreign object in bronchus causing asphyxiation, initial encounter: Secondary | ICD-10-CM | POA: Diagnosis present

## 2024-09-18 DIAGNOSIS — N132 Hydronephrosis with renal and ureteral calculous obstruction: Secondary | ICD-10-CM | POA: Diagnosis present

## 2024-09-18 DIAGNOSIS — J9621 Acute and chronic respiratory failure with hypoxia: Secondary | ICD-10-CM | POA: Diagnosis present

## 2024-09-18 DIAGNOSIS — Z515 Encounter for palliative care: Secondary | ICD-10-CM | POA: Diagnosis not present

## 2024-09-18 DIAGNOSIS — J441 Chronic obstructive pulmonary disease with (acute) exacerbation: Secondary | ICD-10-CM | POA: Diagnosis present

## 2024-09-18 DIAGNOSIS — J440 Chronic obstructive pulmonary disease with acute lower respiratory infection: Secondary | ICD-10-CM | POA: Diagnosis present

## 2024-09-18 DIAGNOSIS — Z95828 Presence of other vascular implants and grafts: Secondary | ICD-10-CM | POA: Diagnosis not present

## 2024-09-18 DIAGNOSIS — C3491 Malignant neoplasm of unspecified part of right bronchus or lung: Secondary | ICD-10-CM | POA: Diagnosis present

## 2024-09-18 DIAGNOSIS — D630 Anemia in neoplastic disease: Secondary | ICD-10-CM | POA: Diagnosis present

## 2024-09-18 DIAGNOSIS — F0390 Unspecified dementia without behavioral disturbance: Secondary | ICD-10-CM | POA: Diagnosis present

## 2024-09-18 DIAGNOSIS — E039 Hypothyroidism, unspecified: Secondary | ICD-10-CM | POA: Diagnosis present

## 2024-09-18 DIAGNOSIS — I110 Hypertensive heart disease with heart failure: Secondary | ICD-10-CM | POA: Diagnosis present

## 2024-09-18 DIAGNOSIS — I5022 Chronic systolic (congestive) heart failure: Secondary | ICD-10-CM | POA: Diagnosis present

## 2024-09-18 DIAGNOSIS — J9622 Acute and chronic respiratory failure with hypercapnia: Secondary | ICD-10-CM | POA: Diagnosis present

## 2024-09-18 DIAGNOSIS — Z1152 Encounter for screening for COVID-19: Secondary | ICD-10-CM | POA: Diagnosis not present

## 2024-09-18 DIAGNOSIS — R6520 Severe sepsis without septic shock: Secondary | ICD-10-CM | POA: Diagnosis present

## 2024-09-18 DIAGNOSIS — W44F9XA Other object of natural or organic material, entering into or through a natural orifice, initial encounter: Secondary | ICD-10-CM | POA: Diagnosis present

## 2024-09-18 DIAGNOSIS — J189 Pneumonia, unspecified organism: Secondary | ICD-10-CM | POA: Diagnosis present

## 2024-09-18 DIAGNOSIS — I452 Bifascicular block: Secondary | ICD-10-CM | POA: Diagnosis present

## 2024-09-18 DIAGNOSIS — G40909 Epilepsy, unspecified, not intractable, without status epilepticus: Secondary | ICD-10-CM | POA: Diagnosis present

## 2024-09-18 LAB — CBG MONITORING, ED
Glucose-Capillary: 152 mg/dL — ABNORMAL HIGH (ref 70–99)
Glucose-Capillary: 158 mg/dL — ABNORMAL HIGH (ref 70–99)
Glucose-Capillary: 192 mg/dL — ABNORMAL HIGH (ref 70–99)
Glucose-Capillary: 208 mg/dL — ABNORMAL HIGH (ref 70–99)

## 2024-09-18 LAB — BASIC METABOLIC PANEL WITH GFR
Anion gap: 7 (ref 5–15)
BUN: 32 mg/dL — ABNORMAL HIGH (ref 8–23)
CO2: 33 mmol/L — ABNORMAL HIGH (ref 22–32)
Calcium: 10.6 mg/dL — ABNORMAL HIGH (ref 8.9–10.3)
Chloride: 102 mmol/L (ref 98–111)
Creatinine, Ser: 0.77 mg/dL (ref 0.44–1.00)
GFR, Estimated: >60 mL/min (ref >60)
Glucose, Bld: 205 mg/dL — ABNORMAL HIGH (ref 70–99)
Potassium: 5.2 mmol/L — ABNORMAL HIGH (ref 3.5–5.1)
Sodium: 142 mmol/L (ref 135–145)

## 2024-09-18 LAB — STREP PNEUMONIAE URINARY ANTIGEN: Strep Pneumo Urinary Antigen: NEGATIVE

## 2024-09-18 LAB — CBC
HCT: 32 % — ABNORMAL LOW (ref 36.0–46.0)
Hemoglobin: 9.5 g/dL — ABNORMAL LOW (ref 12.0–15.0)
MCH: 26.1 pg (ref 26.0–34.0)
MCHC: 29.7 g/dL — ABNORMAL LOW (ref 30.0–36.0)
MCV: 87.9 fL (ref 80.0–100.0)
Platelets: 164 K/uL (ref 150–400)
RBC: 3.64 MIL/uL — ABNORMAL LOW (ref 3.87–5.11)
RDW: 16.8 % — ABNORMAL HIGH (ref 11.5–15.5)
WBC: 6.7 K/uL (ref 4.0–10.5)
nRBC: 0 % (ref 0.0–0.2)

## 2024-09-18 LAB — CORTISOL-AM, BLOOD: Cortisol - AM: 9 ug/dL (ref 6.7–22.6)

## 2024-09-18 LAB — PROTIME-INR
INR: 1 (ref 0.8–1.2)
Prothrombin Time: 14.1 s (ref 11.4–15.2)

## 2024-09-18 LAB — GLUCOSE, CAPILLARY: Glucose-Capillary: 205 mg/dL — ABNORMAL HIGH (ref 70–99)

## 2024-09-18 LAB — PROCALCITONIN: Procalcitonin: 0.43 ng/mL

## 2024-09-18 MED ORDER — SODIUM ZIRCONIUM CYCLOSILICATE 5 G PO PACK
5.0000 g | PACK | Freq: Once | ORAL | Status: AC
  Administered 2024-09-18: 5 g via ORAL
  Filled 2024-09-18: qty 1

## 2024-09-18 MED ORDER — SERTRALINE HCL 50 MG PO TABS
100.0000 mg | ORAL_TABLET | Freq: Every day | ORAL | Status: DC
  Administered 2024-09-18 – 2024-09-22 (×5): 100 mg via ORAL
  Filled 2024-09-18 (×4): qty 2

## 2024-09-18 MED ORDER — ACETYLCYSTEINE 20 % IN SOLN
4.0000 mL | Freq: Two times a day (BID) | RESPIRATORY_TRACT | Status: DC
  Administered 2024-09-19 – 2024-09-21 (×6): 4 mL via RESPIRATORY_TRACT
  Filled 2024-09-18 (×11): qty 4

## 2024-09-18 MED ORDER — SODIUM CHLORIDE 3 % IN NEBU
4.0000 mL | INHALATION_SOLUTION | Freq: Two times a day (BID) | RESPIRATORY_TRACT | Status: DC
  Administered 2024-09-18 – 2024-09-20 (×5): 4 mL via RESPIRATORY_TRACT
  Filled 2024-09-18 (×7): qty 4

## 2024-09-18 MED ORDER — METHYLPREDNISOLONE SODIUM SUCC 40 MG IJ SOLR
40.0000 mg | Freq: Once | INTRAMUSCULAR | Status: AC
  Administered 2024-09-18: 40 mg via INTRAVENOUS
  Filled 2024-09-18: qty 1

## 2024-09-18 MED ORDER — MIDODRINE HCL 5 MG PO TABS
10.0000 mg | ORAL_TABLET | Freq: Three times a day (TID) | ORAL | Status: DC
  Administered 2024-09-18 – 2024-09-19 (×5): 10 mg via ORAL
  Filled 2024-09-18 (×5): qty 2

## 2024-09-18 NOTE — Progress Notes (Signed)
-----------------------------------------------------------  [facility]--------------------------------------------------- --------------------------------------------------------D(Data) A(Action) R(response) Note------------------------------------------------  Patient Name: Michele House Patient DOB: 1956-07-22 Date: @TODAY @      Data: Reviewed labs, VS    Action: No action needed at this time.      Response:       Sharolyn Batman, RN [facility]

## 2024-09-18 NOTE — ED Notes (Signed)
 This RN assisted pt with eating lunch, 40% of meal eaten. Pt also requested an Ensure, 100% of Ensure consumed by pt.

## 2024-09-18 NOTE — Consult Note (Signed)
 PULMONOLOGY         Date: 09/18/2024,   MRN# [id number] Michele House 1956-05-21     AdmissionWeight: 70.2 kg                 CurrentWeight: 70.2 kg  Referring provider: Acute on chr   CHIEF COMPLAINT:   COPD with complete atelectasis of left lower lobe  HISTORY OF PRESENT ILLNESS   This is a pleasant 68 year old female with history of diabetes morbid obesity CVA advanced systolic CHF and COPD with chronic hypoxemia essential hypertension dyslipidemia and thyroid  dysfunction.  Diagnosis of non-small cell lung cancer of the right upper lobe and February 2025.  Patient reports being in her usual state of health [facility] with worsening dyspnea and hypoxemia she required 8 L/min supplemental oxygen in the field by EMS.  Reports fatigue and progressive hypoxemia.  She had chest imaging performed in the emergency department showing previously noted right upper lobe lung mass as well as left lung atelectasis.  She was admitted to hospitalist service with empiric therapy for community-acquired pneumonia receiving IV Rocephin  and IV Zithromax  as well as 2.5 L of IV normal saline resuscitation.  During my interview she reports having normal bowel pattern without diarrhea, able to eat with assistance.  Denies have any fevers or sick contacts.  PCCM consultation placed for additional management evaluation of complete atelectasis of left lower lobe.   PAST MEDICAL HISTORY   Past Medical History:  Diagnosis Date   Acid reflux    COPD (chronic obstructive pulmonary disease) (HCC)    Diabetes mellitus without complication (HCC)    Hyperlipidemia    Hypertension    Stroke (HCC)    Thyroid  disease      SURGICAL HISTORY   Past Surgical History:  Procedure Laterality Date   appendectomy     APPENDECTOMY     BREAST BIOPSY Right    CORE W/CLIP - NEG   ESOPHAGOGASTRODUODENOSCOPY N/A 10/28/2023   Procedure: ESOPHAGOGASTRODUODENOSCOPY (EGD);  Surgeon: Jinny Carmine, MD;  Location: [facility] ENDOSCOPY;  Service: Endoscopy;  Laterality: N/A;   FLEXIBLE BRONCHOSCOPY Bilateral 11/12/2023   Procedure: FLEXIBLE BRONCHOSCOPY;  Surgeon: Parris Manna, MD;  Location: [facility] ORS;  Service: Thoracic;  Laterality: Bilateral;   IR EMBO ART  VEN HEMORR LYMPH EXTRAV  INC GUIDE ROADMAPPING  10/30/2023   IR IMAGING GUIDED PORT INSERTION  01/18/2024   IVC FILTER INSERTION N/A 11/09/2023   Procedure: IVC FILTER INSERTION;  Surgeon: Marea Selinda RAMAN, MD;  Location: [facility] INVASIVE CV LAB;  Service: Cardiovascular;  Laterality: N/A;   TOTAL VAGINAL HYSTERECTOMY     TUMOR REMOVAL     benign;stomach   VIDEO BRONCHOSCOPY WITH ENDOBRONCHIAL NAVIGATION Right 10/20/2023   Procedure: VIDEO BRONCHOSCOPY WITH ENDOBRONCHIAL NAVIGATION;  Surgeon: Parris Manna, MD;  Location: [facility] ORS;  Service: Thoracic;  Laterality: Right;   VIDEO BRONCHOSCOPY WITH ENDOBRONCHIAL NAVIGATION Right 11/12/2023   Procedure: VIDEO BRONCHOSCOPY WITH ENDOBRONCHIAL NAVIGATION;  Surgeon: Parris Manna, MD;  Location: [facility] ORS;  Service: Thoracic;  Laterality: Right;     FAMILY HISTORY   Family History  Problem Relation Age of Onset   Heart attack Mother    Hypertension Mother    Breast cancer Sister 26   Breast cancer Maternal Aunt        40'S   Breast cancer Maternal Grandmother      SOCIAL HISTORY   Social History   Tobacco Use   Smoking status: Former  Current packs/day: 0.00    Types: Cigarettes    Quit date: 12/15/1985    Years since quitting: 38.7   Smokeless tobacco: Never  Substance Use Topics   Alcohol use: No   Drug use: No     MEDICATIONS    Home Medication:  Current Outpatient Rx   Order #: [id number] Class: Historical Med   Order #: [id number] Class: Historical Med   Order #: [id number] Class: Historical Med   Order #: [id number] Class: Historical Med   Order #: [id number] Class: Historical Med   Order #: [id number] Class: Historical Med   Order #: [id number] Class: Historical  Med   Order #: [id number] Class: Historical Med   Order #: [id number] Class: Normal   Order #: [id number] Class: Normal   Order #: [id number] Class: Historical Med   Order #: [id number] Class: No Print   Order #: [id number] Class: No Print   Order #: [id number] Class: Historical Med   Order #: [id number] Class: Historical Med   Order #: [id number] Class: No Print   Order #: [id number] Class: Historical Med   Order #: [id number] Class: Historical Med   Order #: [id number] Class: Historical Med   Order #: [id number] Class: Historical Med   Order #: [id number] Class: Historical Med   Order #: [id number] Class: Historical Med   Order #: [id number] Class: Historical Med   Order #: [id number] Class: Normal   Order #: [id number] Class: Historical Med   Order #: [id number] Class: Historical Med   Order #: [id number] Class: No Print   Order #: [id number] Class: No Print   Order #: [id number] Class: Historical Med   Order #: [id number] Class: No Print   Order #: [id number] Class: Normal   Order #: [id number] Class: Historical Med   Order #: [id number] Class: Historical Med   Order #: [id number] Class: Historical Med   Order #: [id number] Class: Print   Order #: [id number] Class: Historical Med   Order #: [id number] Class: Normal   Order #: [id number] Class: Normal   Order #: [id number] Class: Historical Med   Order #: [id number] Class: Normal    Current Medication:  Current Facility-Administered Medications:    (feeding supplement) PROSource Plus liquid 30 mL, 30 mL, Oral, BID BM, Mansy, Jan A, MD, 30 mL at 09/18/24 1045   acetaminophen  (TYLENOL ) tablet 650 mg, 650 mg, Oral, Q6H PRN **OR** acetaminophen  (TYLENOL ) suppository 650 mg, 650 mg, Rectal, Q6H PRN, Mansy, Jan A, MD   alum & mag hydroxide-simeth (MAALOX/MYLANTA) 200-200-20 MG/5ML suspension 20 mL, 20 mL, Oral, Q6H PRN, Mansy, Jan A, MD   aspirin  EC tablet 81 mg, 81 mg, Oral, Daily, Mansy, Jan A, MD, 81 mg at 09/18/24 1031   atorvastatin  (LIPITOR) tablet 20 mg, 20 mg, Oral, Daily, Mansy, Jan  A, MD, 20 mg at 09/18/24 1041   azithromycin  (ZITHROMAX ) 500 mg in sodium chloride  0.9 % 250 mL IVPB, 500 mg, Intravenous, Q24H, Mansy, Jan A, MD   busPIRone (BUSPAR) tablet 20 mg, 20 mg, Oral, BID, Mansy, Jan A, MD, 20 mg at 09/18/24 1029   calcium  carbonate (TUMS - dosed in mg elemental calcium ) chewable tablet 200 mg of elemental calcium , 1 tablet, Oral, Daily, Mansy, Jan A, MD, 200 mg of elemental calcium  at 09/18/24 1024   cefTRIAXone  (ROCEPHIN ) 2 g in sodium chloride  0.9 % 100 mL IVPB, 2 g, Intravenous, Q24H, Mansy, Jan A, MD   chlorpheniramine-HYDROcodone  (TUSSIONEX) 10-8 MG/5ML suspension 5 mL, 5 mL, Oral, Q12H PRN, Mansy, Jan A, MD   collagenase (SANTYL) ointment 1 Application, 1 Application, Topical, Daily, Mansy, Jan A, MD   enoxaparin  (LOVENOX ) injection 40 mg, 40 mg, Subcutaneous, Q24H, Mansy, Jan A, MD, 40 mg at 09/18/24 1043  feeding supplement (GLUCERNA SHAKE) (GLUCERNA SHAKE) liquid 237 mL, 237 mL, Oral, BID BM, Mansy, Jan A, MD   ferrous sulfate tablet 325 mg, 325 mg, Oral, BID WC, Mansy, Jan A, MD, 325 mg at 09/18/24 9155   guaiFENesin  (MUCINEX ) 12 hr tablet 600 mg, 600 mg, Oral, BID, Mansy, Jan A, MD, 600 mg at 09/18/24 1027   insulin  aspart (novoLOG ) injection 0-20 Units, 0-20 Units, Subcutaneous, TID WC, Mansy, Jan A, MD, 4 Units at 09/18/24 1316   insulin  aspart (novoLOG ) injection 0-5 Units, 0-5 Units, Subcutaneous, QHS, Mansy, Jan A, MD   insulin  aspart (novoLOG ) injection 4 Units, 4 Units, Subcutaneous, TID WC, Mansy, Madison LABOR, MD, 4 Units at 09/18/24 1314   insulin  glargine-yfgn (SEMGLEE ) injection 18 Units, 18 Units, Subcutaneous, Daily, Mansy, Jan A, MD, 18 Units at 09/18/24 1055   ipratropium-albuterol  (DUONEB) 0.5-2.5 (3) MG/3ML nebulizer solution 3 mL, 3 mL, Nebulization, QID, Mansy, Jan A, MD, 3 mL at 09/18/24 1321   ipratropium-albuterol  (DUONEB) 0.5-2.5 (3) MG/3ML nebulizer solution 3 mL, 3 mL, Nebulization, Q4H PRN, Dail Rankin RAMAN, RPH, 3 mL at 09/18/24 0041    levETIRAcetam  (KEPPRA ) tablet 500 mg, 500 mg, Oral, BID, Mansy, Jan A, MD, 500 mg at 09/18/24 1031   levothyroxine  (SYNTHROID ) tablet 125 mcg, 125 mcg, Oral, Q0600, Mansy, Jan A, MD   lidocaine  (LIDODERM ) 5 % 1 patch, 1 patch, Transdermal, Q24H, Mansy, Jan A, MD   linagliptin  (TRADJENTA ) tablet 5 mg, 5 mg, Oral, Daily, Mansy, Jan A, MD, 5 mg at 09/18/24 1032   loratadine  (CLARITIN ) tablet 10 mg, 10 mg, Oral, Daily, Mansy, Jan A, MD, 10 mg at 09/18/24 1034   losartan (COZAAR) tablet 25 mg, 25 mg, Oral, Daily, Mansy, Jan A, MD, 25 mg at 09/18/24 1035   magnesium  hydroxide (MILK OF MAGNESIA) suspension 30 mL, 30 mL, Oral, Daily PRN, Mansy, Jan A, MD   meclizine  (ANTIVERT ) tablet 12.5 mg, 12.5 mg, Oral, BID, Mansy, Jan A, MD, 12.5 mg at 09/18/24 1028   methylPREDNISolone  sodium succinate (SOLU-MEDROL ) 125 mg/2 mL injection 80 mg, 80 mg, Intravenous, Q24H, Mansy, Jan A, MD, 80 mg at 09/18/24 1043   metoprolol succinate (TOPROL-XL) 24 hr tablet 50 mg, 50 mg, Oral, Daily, Mansy, Jan A, MD, 50 mg at 09/18/24 1030   midodrine (PROAMATINE) tablet 10 mg, 10 mg, Oral, TID WC, Mansy, Jan A, MD, 10 mg at 09/18/24 1317   multivitamin with minerals tablet 1 tablet, 1 tablet, Oral, Daily, Mansy, Jan A, MD, 1 tablet at 09/18/24 1033   nutrition supplement (JUVEN) (JUVEN) powder packet 1 packet, 1 packet, Oral, BID BM, Mansy, Jan A, MD   nystatin (MYCOSTATIN/NYSTOP) topical powder 1 Application, 1 Application, Topical, BID, Mansy, Jan A, MD   ondansetron  (ZOFRAN ) tablet 4 mg, 4 mg, Oral, Q6H PRN **OR** ondansetron  (ZOFRAN ) injection 4 mg, 4 mg, Intravenous, Q6H PRN, Mansy, Jan A, MD   pantoprazole  (PROTONIX ) EC tablet 40 mg, 40 mg, Oral, BID AC, Mansy, Jan A, MD, 40 mg at 09/18/24 0844   polyethylene glycol (MIRALAX  / GLYCOLAX ) packet 17 g, 17 g, Oral, Daily, Mansy, Jan A, MD, 17 g at 09/18/24 1042   pregabalin  (LYRICA ) capsule 50 mg, 50 mg, Oral, BID, Mansy, Jan A, MD, 50 mg at 09/18/24 1030   rOPINIRole  (REQUIP  XL)  24 hr tablet 4 mg, 4 mg, Oral, QHS, Mansy, Jan A, MD   senna (SENOKOT) tablet 8.6 mg, 1 tablet, Oral, BID, Mansy, Jan A, MD, 8.6 mg at 09/18/24 1034  sertraline  (ZOLOFT ) tablet 100 mg, 100 mg, Oral, Daily, Ponnala, Shruthi, MD, 100 mg at 09/18/24 1041   sodium chloride  HYPERTONIC 3 % nebulizer solution 4 mL, 4 mL, Nebulization, BID, Ponnala, Shruthi, MD, 4 mL at 09/18/24 1321   traMADol  (ULTRAM ) tablet 50 mg, 50 mg, Oral, Q6H PRN, Mansy, Jan A, MD   traZODone (DESYREL) tablet 25 mg, 25 mg, Oral, QHS PRN, Mansy, Jan A, MD   zinc oxide 20 % ointment 1 Application, 1 Application, Topical, BID, Mansy, Jan A, MD  Current Outpatient Medications:    acetaminophen  (TYLENOL ) 325 MG tablet, Take 650 mg by mouth every 6 (six) hours as needed., Disp: , Rfl:    alum & mag hydroxide-simeth (MAALOX PLUS) 400-400-40 MG/5ML suspension, Take 10 mLs by mouth every 6 (six) hours as needed for indigestion., Disp: , Rfl:    aspirin  EC 81 MG tablet, Take 81 mg by mouth daily., Disp: , Rfl:    atorvastatin  (LIPITOR) 20 MG tablet, Take 20 mg by mouth daily., Disp: , Rfl:    busPIRone (BUSPAR) 10 MG tablet, Take 20 mg by mouth 2 (two) times daily., Disp: , Rfl:    calcium  carbonate (TUMS - DOSED IN MG ELEMENTAL CALCIUM ) 500 MG chewable tablet, Chew 1 tablet by mouth daily., Disp: , Rfl:    cetirizine (ZYRTEC) 10 MG tablet, Take 10 mg by mouth daily., Disp: , Rfl:    DULERA 200-5 MCG/ACT AERO, Inhale 2 puffs into the lungs every 12 (twelve) hours., Disp: , Rfl:    ferrous sulfate 325 (65 FE) MG tablet, Take 1 tablet (325 mg total) by mouth 2 (two) times daily with a meal., Disp: 60 tablet, Rfl: 3   furosemide  (LASIX ) 20 MG tablet, Take 1 tablet (20 mg total) by mouth daily., Disp: 30 tablet, Rfl: 0   glipiZIDE  (GLUCOTROL ) 5 MG tablet, Take 5 mg by mouth daily before breakfast., Disp: , Rfl:    insulin  aspart (NOVOLOG ) 100 UNIT/ML injection, Inject 4 Units into the skin 3 (three) times daily with meals., Disp: , Rfl:     insulin  glargine-yfgn (SEMGLEE ) 100 UNIT/ML injection, Inject 0.18 mLs (18 Units total) into the skin daily., Disp: , Rfl:    ipratropium (ATROVENT HFA) 17 MCG/ACT inhaler, Inhale 2 puffs into the lungs every 6 (six) hours as needed., Disp: , Rfl:    ipratropium-albuterol  (DUONEB) 0.5-2.5 (3) MG/3ML SOLN, Take 3 mLs by nebulization 2 (two) times daily., Disp: , Rfl:    levETIRAcetam  (KEPPRA ) 500 MG tablet, Take 1 tablet (500 mg total) by mouth 2 (two) times daily., Disp: , Rfl:    levothyroxine  (SYNTHROID ) 125 MCG tablet, Take 125 mcg by mouth every morning., Disp: , Rfl:    lidocaine  (LIDODERM ) 5 %, Place 1 patch onto the skin daily. Remove & Discard patch within 12 hours or as directed by MD, Disp: , Rfl:    lidocaine -prilocaine  (EMLA ) cream, Apply 1 Application topically once., Disp: , Rfl:    losartan (COZAAR) 25 MG tablet, Take 25 mg by mouth daily., Disp: , Rfl:    meclizine  (ANTIVERT ) 12.5 MG tablet, Take 12.5 mg by mouth 2 (two) times daily., Disp: , Rfl:    metoprolol succinate (TOPROL-XL) 50 MG 24 hr tablet, Take 50 mg by mouth daily. Take with or immediately following a meal., Disp: , Rfl:    Multiple Vitamin (MULTIVITAMIN) capsule, Take 1 capsule by mouth daily., Disp: , Rfl:    nutrition supplement, JUVEN, (JUVEN) PACK, Take 1 packet by mouth 2 (two) times  daily between meals., Disp: 30 packet, Rfl: 0   nystatin (MYCOSTATIN/NYSTOP) powder, Apply 1 Application topically 2 (two) times daily., Disp: , Rfl:    ondansetron  (ZOFRAN ) 4 MG/5ML solution, Take 8 mg by mouth every 8 (eight) hours as needed for refractory nausea / vomiting., Disp: , Rfl:    pantoprazole  (PROTONIX ) 40 MG tablet, Take 1 tablet (40 mg total) by mouth 2 (two) times daily before a meal., Disp: , Rfl:    polyethylene glycol (MIRALAX  / GLYCOLAX ) 17 g packet, Take 17 g by mouth daily., Disp: , Rfl:    pregabalin  (LYRICA ) 50 MG capsule, Take 50 mg by mouth 2 (two) times daily., Disp: , Rfl:    rOPINIRole  (REQUIP  XL) 4 MG 24  hr tablet, Take 1 tablet (4 mg total) by mouth at bedtime., Disp: , Rfl:    senna (SENOKOT) 8.6 MG TABS tablet, Take 1 tablet (8.6 mg total) by mouth in the morning and at bedtime., Disp: 120 tablet, Rfl: 3   sertraline  (ZOLOFT ) 100 MG tablet, Take 100 mg by mouth daily., Disp: , Rfl:    spironolactone (ALDACTONE) 25 MG tablet, Take 25 mg by mouth daily., Disp: , Rfl:    TRADJENTA  5 MG TABS tablet, Take 5 mg by mouth daily., Disp: , Rfl:    traMADol  (ULTRAM ) 50 MG tablet, Take 1 tablet (50 mg total) by mouth every 6 (six) hours as needed for severe pain (pain score 7-10)., Disp: 10 tablet, Rfl: 0   ZINC OXIDE, TOPICAL, 25 % OINT, Apply 1 Application topically 2 (two) times daily., Disp: , Rfl:    collagenase (SANTYL) 250 UNIT/GM ointment, Apply topically daily., Disp: 15 g, Rfl: 0   feeding supplement, GLUCERNA SHAKE, (GLUCERNA SHAKE) LIQD, Take 237 mLs by mouth 2 (two) times daily between meals., Disp: 237 mL, Rfl: 0   naloxone (NARCAN) nasal spray 4 mg/0.1 mL, Place 1 spray into the nose., Disp: , Rfl:    Nutritional Supplements (,FEEDING SUPPLEMENT, PROSOURCE PLUS) liquid, Take 30 mLs by mouth 2 (two) times daily between meals., Disp: 887 mL, Rfl: 0  Facility-Administered Medications Ordered in Other Encounters:    oxyCODONE  (Oxy IR/ROXICODONE ) immediate release tablet 5 mg, 5 mg, Oral, Once, Agrawal, Kavita, MD    ALLERGIES   Penicillins, Aspirin , and Sulfa antibiotics     REVIEW OF SYSTEMS    Review of Systems:  Gen:  Denies  fever, sweats, chills weigh loss  HEENT: Denies blurred vision, double vision, ear pain, eye pain, hearing loss, nose bleeds, sore throat Cardiac:  No dizziness, chest pain or heaviness, chest tightness,edema Resp:   reports dyspnea chronically  Gi: Denies swallowing difficulty, stomach pain, nausea or vomiting, diarrhea, constipation, bowel incontinence Gu:  Denies bladder incontinence, burning urine Ext:   Denies Joint pain, stiffness or swelling Skin:  Denies  skin rash, easy bruising or bleeding or hives Endoc:  Denies polyuria, polydipsia , polyphagia or weight change Psych:   Denies depression, insomnia or hallucinations   Other:  All other systems negative   VS: BP 107/60   Pulse 83   Temp 97.6 F (36.4 C) (Oral)   Resp 10   Ht 5' 1 (1.549 m)   Wt 70.2 kg   LMP 12/24/1998 Comment: prior to her hysterectomy  SpO2 98%   BMI 29.24 kg/m      PHYSICAL EXAM    GENERAL:NAD, no fevers, chills, no weakness no fatigue HEAD: Normocephalic, atraumatic.  EYES: Pupils equal, round, reactive to light. Extraocular muscles intact. No  scleral icterus.  MOUTH: Moist mucosal membrane. Dentition intact. No abscess noted.  EAR, NOSE, THROAT: Clear without exudates. No external lesions.  NECK: Supple. No thyromegaly. No nodules. No JVD.  PULMONARY: decreased breath sounds with mild rhonchi worse at bases bilaterally.  CARDIOVASCULAR: S1 and S2. Regular rate and rhythm. No murmurs, rubs, or gallops. No edema. Pedal pulses 2+ bilaterally.  GASTROINTESTINAL: Soft, nontender, nondistended. No masses. Positive bowel sounds. No hepatosplenomegaly.  MUSCULOSKELETAL: No swelling, clubbing, or edema. Range of motion full in all extremities.  NEUROLOGIC: Cranial nerves II through XII are intact. No gross focal neurological deficits. Sensation intact. Reflexes intact.  SKIN: No ulceration, lesions, rashes, or cyanosis. Skin warm and dry. Turgor intact.  PSYCHIATRIC: Mood, affect within normal limits. The patient is awake, alert and oriented x 3. Insight, judgment intact.       IMAGING     Narrative & Impression  EXAM: CTA CHEST 09/17/2024 10:07:39 PM   TECHNIQUE: CTA of the chest was performed after the administration of intravenous contrast. Multiplanar reformatted images are provided for review. MIP images are provided for review. Automated exposure control, iterative reconstruction, and/or weight based adjustment of the mA/kV was  utilized to reduce the radiation dose to as low as reasonably achievable.   COMPARISON: None available.   CLINICAL HISTORY: Lung cancer patient with right upper lobe neoplasm presenting with hypoxemia and respiratory distress. Pulmonary embolism (PE) suspected, high prob.   FINDINGS:   PULMONARY ARTERIES: Pulmonary arteries are adequately opacified for evaluation. The pulmonary arteries are slightly prominent but no arterial embolus is seen.   MEDIASTINUM: There is mild cardiomegaly with a slight right chamber predominance with RV/LV ratio of 1.06. There is no pericardial effusion. There is atherosclerosis in the aorta, great vessels, small amount in the left main and proximal LAD coronary artery. No aortic dissection or aneurysm.   The pulmonary veins are nondilated. There is mild chronic enlargement and heterogeneity of the thyroid  gland without a dominant nodule.   There is a chronically patulous esophagus without wall thickening. IVC filter in place. Left chest mediport with IJ approach catheter terminating in the SVC.   LYMPH NODES: Subcarinal adenopathy is unchanged up to 1.2 cm on axial 58 of series 6, with increased prominence of AP window and left hilar nodes up to 1.2 cm, increased prominence of right hilar nodes up to 1.5 cm on axial 67.   LUNGS AND PLEURA: Lungs are emphysematous. There is mild layering fluid in the distal trachea. There is layering fluid in the bilateral main bronchi, on the left extending into and obstructing the main lower lobe and lower lobe segmental bronchi, with consolidation throughout the left lower lobe consistent with a consolidated pneumonia.   There is a right upper lobe suprahilar mass encasing central bronchovascular structures with bronchial narrowing.   It is difficult to distinguish between the mass and adjacent pulmonary consolidation. In total, the mass and consolidation today measure 5 x 6.6 cm on axial 47 of series 6,  not grossly changed from the PET CT but certainly significantly larger than on 05/24/2024. There is surrounding spiculation and stranding to pleural surfaces and areas of pleural thickening in the right chest superiorly and medially probably due to pleural metastatic disease and hypermetabolic on the PET CT.   There is a small layering right pleural effusion with partial loculation in the superior chest. In the right lower lobe, several coarsely nodular opacities are again noted, consistent with patchy airspace disease versus intraorgan metastases.  The largest of these is 2.3 x 1.9 cm on series 8 axial 82, unchanged.   Elsewhere, there is progressive diffuse interstitial thickening and micronodularity relatively sparing the right middle lobe and first seen on PET CT but worsened since.   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 No pneumothorax.   UPPER ABDOMEN: The gallbladder is dilated and there are tiny layering stones posteriorly without wall thickening.   There are stones in the right renal collecting system and renal pelvis, largest is 6 mm just above the UPJ. There is mild right hydronephrosis.   The liver is steatotic. There is a 2 cm Bosniak 1 cyst in the right upper pole.   SOFT TISSUES AND BONES: There is osteopenia and kyphosis of the thoracic spine. No destructive bone lesions. No acute soft tissue abnormality.   IMPRESSION: 1. No evidence of pulmonary embolism. 2. Left lower lobe consolidation with mucus impaction and complete obstruction of the left lower lobe bronchus, consistent with pneumonia. 3. Cardiomegaly with right chamber predominance indicating right heart dysfunction. the pulmonary arteries are slightly prominent. 4. Fluid in the main bronchi, obstructing the left lower lobe main and segmental bronchi with complete consolidation/ atelectasis of the left lower lobe. 5.  Right upper lobe suprahilar mass with bronchovascular encasement and bronchial narrowing, overall size approximately 5 x 6.6 cm, not grossly changed from recent PET CT but increased from 05/24/2024, with associated pleural thickening suspicious for pleural metastatic disease and small partially loculated right pleural effusion . 6. 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 7. Mediastinal and hilar adenopathy with interval increase in AP window and bilateral hilar nodes up to 1.5 cm and stable subcarinal node up to 1.2 cm. 8. Right nephrolithiasis including a 6 mm renal pelvis just above the UPJ with mild right hydronephrosis. 9. Emphysema, with aortic and coronary atherosclerosis.   Electronically signed by: Francis Quam MD 09/17/2024 10:53 PM EST RP Workstation: [id number]      Result History    ASSESSMENT/PLAN   Complete atelectasis of left lower lobe Patient with known advanced COPD and non-small cell lung cancer , Initially will attempt noninvasive modalities with Mucomyst nebulized solution 20% 4 mL twice daily - Incentive spirometry and flutter valve to be used by patient multiple times each hour  -Discussed care plan with RT today- -patient receiving hypertonic saline nebulized for bronchopulmonary hygiene -patient has received 2 treatments of Mucomyst over the last 24 hours with minimal improvement we will consider bronchoscopy during this hospitalization   Community-acquired pneumonia of left lung Agree with IV Rocephin  and Zithromax  with a goal of 7 days total therapy     Advanced COPD with chronic hypoxemia Continue nebulizer therapy as needed -Solu-Medrol  is been reduced from 80 mg to 60 IV daily   Morbid obesity and physical deconditioning - Contributing to atelectasis, recommendation for PT OT as soon as able    Thank you for allowing me to participate in the care of this patient.    Patient/Family are satisfied with care plan and all questions have been answered.    Provider disclosure: Patient with at least one acute or chronic illness or injury that poses a threat to life or bodily function and is being managed actively during this encounter.  All of the below services have been performed independently by signing provider:  review of prior documentation from internal and or external health records.  Review of previous and current lab  results.  Interview and comprehensive assessment during patient visit today. Review of current and previous chest radiographs/CT scans. Discussion of management and test interpretation with health care team and patient/family.   This document was prepared using Dragon voice recognition software and may include unintentional dictation errors.     Olajuwon Fosdick, M.D.  Division of Pulmonary & Critical Care Medicine

## 2024-09-18 NOTE — Consult Note (Signed)
 Urology Consult   I have been asked to see the patient by Dr. Jerelene, for evaluation and management of nephrolithiasis.  HPI:  Michele House is a 68 y.o. year old  Admitted last night with acute pneumonia, hypoxia, sepsis in the setting of COPD, as well as metastatic lung cancer.   CT C/A/P (09/17/24) - partially imaged, incidental Right renal nephrolithiasis including UPJ, appears to have several lower pole stones, possible conglomerate at right renal pelvis. Minimal right hydronephrosis. No left nephrolithiasis.   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, PUD, essential hypertension, HFrEF with a EF of 30% as of July 2025, dyslipidemia, CVA and hypothyroidism       PMH: Past Medical History:  Diagnosis Date   Acid reflux    COPD (chronic obstructive pulmonary disease) (HCC)    Diabetes mellitus without complication (HCC)    Hyperlipidemia    Hypertension    Stroke (HCC)    Thyroid  disease     Surgical History: Past Surgical History:  Procedure Laterality Date   appendectomy     APPENDECTOMY     BREAST BIOPSY Right    CORE W/CLIP - NEG   ESOPHAGOGASTRODUODENOSCOPY N/A 10/28/2023   Procedure: ESOPHAGOGASTRODUODENOSCOPY (EGD);  Surgeon: Jinny Carmine, MD;  Location: [facility] ENDOSCOPY;  Service: Endoscopy;  Laterality: N/A;   FLEXIBLE BRONCHOSCOPY Bilateral 11/12/2023   Procedure: FLEXIBLE BRONCHOSCOPY;  Surgeon: Parris Manna, MD;  Location: [facility] ORS;  Service: Thoracic;  Laterality: Bilateral;   IR EMBO ART  VEN HEMORR LYMPH EXTRAV  INC GUIDE ROADMAPPING  10/30/2023   IR IMAGING GUIDED PORT INSERTION  01/18/2024   IVC FILTER INSERTION N/A 11/09/2023   Procedure: IVC FILTER INSERTION;  Surgeon: Marea Selinda RAMAN, MD;  Location: [facility]  INVASIVE CV LAB;  Service: Cardiovascular;  Laterality: N/A;   TOTAL VAGINAL HYSTERECTOMY     TUMOR REMOVAL     benign;stomach   VIDEO BRONCHOSCOPY WITH ENDOBRONCHIAL NAVIGATION Right 10/20/2023   Procedure: VIDEO BRONCHOSCOPY WITH ENDOBRONCHIAL NAVIGATION;  Surgeon: Parris Manna, MD;  Location: [facility] ORS;  Service: Thoracic;  Laterality: Right;   VIDEO BRONCHOSCOPY WITH ENDOBRONCHIAL NAVIGATION Right 11/12/2023   Procedure: VIDEO BRONCHOSCOPY WITH ENDOBRONCHIAL NAVIGATION;  Surgeon: Parris Manna, MD;  Location: [facility] ORS;  Service: Thoracic;  Laterality: Right;    Allergies:  Allergies  Allergen Reactions   Penicillins Hives   Aspirin  Other (See Comments)    Lips numb   Sulfa Antibiotics     Family History: Family History  Problem Relation Age of Onset   Heart attack Mother    Hypertension Mother    Breast cancer Sister 90   Breast cancer Maternal Aunt        40'S   Breast cancer Maternal Grandmother     Social History:  reports that she quit smoking about 38 years ago. Her smoking use included cigarettes. She has never used smokeless tobacco. She reports that she does not drink alcohol and does not use drugs.  ROS: Negative aside from those stated in the HPI.  Physical Exam: BP 103/60 (BP Location: Right Arm)   Pulse 80   Temp 97.6 F (36.4 C) (Oral)   Resp 15   Ht 5' 1 (1.549 m)   Wt 70.2 kg   LMP 12/24/1998  Comment: prior to her hysterectomy  SpO2 94%   BMI 29.24 kg/m    Constitutional:  Alert and oriented, No acute distress. Cardiovascular: No clubbing, cyanosis, or edema. Respiratory: Normal respiratory effort, no increased work of breathing. GI: Abdomen is soft, nontender, nondistended, no abdominal masses Lymph: No cervical or inguinal lymphadenopathy. Skin: No rashes, bruises or suspicious lesions. Neurologic: Grossly intact, no focal deficits, moving all 4 extremities. Psychiatric: Normal mood and affect.  Laboratory Data: UA - neg LE/nitrite,  20-50 RBC, 11-20 WBC, >10 squamous BMP 0.77  Pertinent Imaging: I have personally reviewed the CT C/A/P (09/17/24) - incidental Right renal nephrolithiasis including UPJ, appears to have several lower pole stones, possible conglomerate at right renal pelvis. Minimal right hydronephrosis.     Assessment & Plan:   Incidental Right renal nephrolithiasis  - multiple RLP stones + right renal pelvic conglomerate/small stone stacking   - minimal Right hydro   Reviewed CT imaging and clinical picture Nephrolithiasis does not seem to be contributing to current clinical condition  If new/worsening Right flank pain - proceed with CT Stone protocol to fully evaluate Right ureter/bladder She will need to follow up with Urology as outpatient for continued discussion/management   Michele JONELLE Skye, MD  [facility] [address], [address] [place], [place] [address] [phone]

## 2024-09-18 NOTE — Progress Notes (Addendum)
 PROGRESS NOTE    Michele House  FMW:[id number] DOB: 1955/11/22 DOA: 09/17/2024 PCP: [facility]  Chief Complaint  Patient presents with   Abnormal Lab    SNF reported 44    Hospital Course:  Michele House is a 68 y.o. 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  On presentation also had worsening dyspnea, hypoxia.  Patient admitted for sepsis due to pneumonia, hospital course as below  Subjective: Patient was examined at bedside, new to me today.  Answering questions intermittently States she feels fine, denies any complaints at the bed Consulted pulmonary and urology for further recs Called son Michele House, provided updates and answered all questions   Objective: Vitals:   09/18/24 0205 09/18/24 0429 09/18/24 0530 09/18/24 0906  BP:   106/66 103/65  Pulse:   92 87  Resp:   11 10  Temp:  97.8 F (36.6 C)  97.6 F (36.4 C)  TempSrc:  Oral  Oral  SpO2: 98%  97% 100%  Weight:      Height:       No intake or output data in the 24 hours ending 09/18/24 1008 Filed Weights   09/17/24 1846  Weight: 70.2 kg    Examination: GENERAL:  68 y.o.-year-old patient lying in the bed with no acute distress.  NECK:  Supple, no jugular venous distention. No thyroid  enlargement, no tenderness.  LUNGS: Diminished bibasilar breath sounds with bibasal and midlung zone crackles.  She has associated expiratory wheezes with diminished expiratory airflow and heart vesicular breathing.  No use of accessory muscles of respiration.  CARDIOVASCULAR: Regular rate and rhythm, S1, S2 normal. No murmurs, rubs, or gallops.  ABDOMEN: Soft, nondistended, nontender. Bowel sounds present.  No organomegaly or mass.  EXTREMITIES: No pedal edema, cyanosis, or clubbing.  NEUROLOGIC: Cranial nerves II through XII are intact. Muscle strength 5/5 in all extremities. Sensation intact. Gait not checked.  PSYCHIATRIC: Awake, alert  Assessment & Plan:  Sepsis due to pneumonia - LA normal, No leukocytosis - Flu/COVID/RSV negative, UA with squamous cells (likely contaminant) - CTA LLL consolidation with mucus impaction and complete obstruction of LLL bronchus.  Fluid in the main bronchi, complete consolidation/atelectasis of left lower lobe, Cardiomegaly with right chamber prominence indicating right heart dysfunction. RUL suprahilar mass bronchovascular encasement and bronchial narrowing, size approximately 5 into 6.6 cm, not grossly changed -with associated pleural thickening suspicious for pleural metastatic disease. Progressive diffuse interstitial thickening and micronodularity, consistent with lymphangitic carcinomatosis. Mediastinal and hilar adenopathy - Continue IV Ceftriaxone , Azithromycin  - Pulmonary consult for further recs, may need bronchoscopy - appreciate recs - Send respiratory culture, procalcitonin, Streptococcus pneumonia, urine Legionella - SLP eval pending to r/o aspiration - Hypertonic saline nebs. Discontinue IV fluids - Follow blood cultures   COPD with acute exacerbation - Continue IV Solu-Medrol , DuoNebs.  On azithromycin   Acute on chronic respiratory failure with hypoxia and hypercapnia - Likely secondary to pneumonia and COPD exacerbation - Wean O2 as able  Mild hyperkalemia - One dose Lokelma  5g  Mild right hydronephrosis - Imaging shows Right nephrolithiasis including a 6 mm renal pelvis just above the UPJ with mild right hydronephrosis - Urology evaluation  Chronic HFrEF (heart failure with reduced  ejection fraction) - Hold Aldactone and lasix  due to hypotension and appears euvolemic - Continue metoprolol, Tradjenta   Dementia - Supportive care    History of right leg DVT, status post IVC filter - Happened January of this year, IVC filter placed instead of anticoagulation due to concurrent severe GI bleed.     History of duodenal ulcer and upper GI bleed  - During same hospitalization in January, patient also developed severe duodenum bleeding, subsequently underwent EGD and clipping but clipping failed and patient received IR embolization 2 days later. - Monito Hb   Seizure disorder - Continue Keppra    IDDM - Continue Lantus  18units, Novolog  4u TID, SSI - Also on steroids  -- PT/OT eval  DVT prophylaxis: Lovenox  SQ   Code Status: Full Code Disposition:  TBD  Consultants:  Pulmonary Urology  Procedures:  None  Antimicrobials:  Anti-infectives (From admission, onward)    Start     Dose/Rate Route Frequency Ordered Stop   09/18/24 2000  cefTRIAXone  (ROCEPHIN ) 2 g in sodium chloride  0.9 % 100 mL IVPB        2 g 200 mL/hr over 30 Minutes Intravenous Every 24 hours 09/17/24 2337 09/22/24 1959   09/18/24 2000  azithromycin  (ZITHROMAX ) 500 mg in sodium chloride  0.9 % 250 mL IVPB        500 mg 250 mL/hr over 60 Minutes Intravenous Every 24 hours 09/17/24 2337 09/22/24 1959   09/17/24 1915  cefTRIAXone  (ROCEPHIN ) 2 g in sodium chloride  0.9 % 100 mL IVPB        2 g 200 mL/hr over 30 Minutes Intravenous Once 09/17/24 1909 09/17/24 1954   09/17/24 1915  azithromycin  (ZITHROMAX ) 500 mg in sodium chloride  0.9 % 250 mL IVPB        500 mg 250 mL/hr over 60 Minutes Intravenous  Once 09/17/24 1909 09/17/24 2114       Data Reviewed: I have personally reviewed following labs and imaging studies CBC: Recent Labs  Lab 09/17/24 1855 09/18/24 0830  WBC 6.2 6.7  NEUTROABS 4.9  --   HGB 10.0* 9.5*  HCT 33.5* 32.0*  MCV 86.8 87.9  PLT 210 164   Basic Metabolic Panel: Recent Labs  Lab 09/17/24 1855 09/18/24 0830  NA 143 142  K 4.7 5.2*  CL 97* 102  CO2 38* 33*  GLUCOSE 114* 205*  BUN 28* 32*  CREATININE 0.74 0.77   CALCIUM  11.7* 10.6*   GFR: Estimated Creatinine Clearance: 58.7 mL/min (by C-G formula based on SCr of 0.77 mg/dL). Liver Function Tests: Recent Labs  Lab 09/17/24 1855  AST 34  ALT 24  ALKPHOS 83  BILITOT 0.3  PROT 5.8*  ALBUMIN 3.5   CBG: Recent Labs  Lab 09/18/24 0011 09/18/24 0837  GLUCAP 158* 208*    Recent Results (from the past 240 hours)  Blood Culture (routine x 2)     Status: None (Preliminary result)   Collection Time: 09/17/24  6:55 PM   Specimen: BLOOD  Result Value Ref Range Status   Specimen Description BLOOD LEFT ANTECUBITAL  Final   Special Requests   Final    BOTTLES DRAWN AEROBIC AND ANAEROBIC Blood Culture results may not be optimal due to an inadequate volume of blood received in culture bottles   Culture   Final    NO GROWTH < 12 HOURS Performed at [facility], [address], [place], [place] [address]    Report Status PENDING  Incomplete  Resp panel by RT-PCR (RSV, Flu  A&B, Covid) Anterior Nasal Swab     Status: None   Collection Time: 09/17/24  7:10 PM   Specimen: Anterior Nasal Swab  Result Value Ref Range Status   SARS Coronavirus 2 by RT PCR NEGATIVE NEGATIVE Final    Comment: (NOTE) SARS-CoV-2 target nucleic acids are NOT DETECTED.  The SARS-CoV-2 RNA is generally detectable in upper respiratory specimens during the acute phase of infection. The lowest concentration of SARS-CoV-2 viral copies this assay can detect is 138 copies/mL. A negative result does not preclude SARS-Cov-2 infection and should not be used as the sole basis for treatment or other patient management decisions. A negative result may occur with  improper specimen collection/handling, submission of specimen other than nasopharyngeal swab, presence of viral mutation(s) within the areas targeted by this assay, and inadequate number of viral copies(<138 copies/mL). A negative result must be combined with clinical observations, patient history, and  epidemiological information. The expected result is Negative.  Fact Sheet for Patients:  bloggercourse.com  Fact Sheet for Healthcare Providers:  seriousbroker.it  This test is no t yet approved or cleared by the [place]  FDA and  has been authorized for detection and/or diagnosis of SARS-CoV-2 by FDA under an Emergency Use Authorization (EUA). This EUA will remain  in effect (meaning this test can be used) for the duration of the COVID-19 declaration under Section 564(b)(1) of the Act, 21 U.S.C.section 360bbb-3(b)(1), unless the authorization is terminated  or revoked sooner.       Influenza A by PCR NEGATIVE NEGATIVE Final   Influenza B by PCR NEGATIVE NEGATIVE Final    Comment: (NOTE) The Xpert Xpress SARS-CoV-2/FLU/RSV plus assay is intended as an aid in the diagnosis of influenza from Nasopharyngeal swab specimens and should not be used as a sole basis for treatment. Nasal washings and aspirates are unacceptable for Xpert Xpress SARS-CoV-2/FLU/RSV testing.  Fact Sheet for Patients: bloggercourse.com  Fact Sheet for Healthcare Providers: seriousbroker.it  This test is not yet approved or cleared by the [place]  FDA and has been authorized for detection and/or diagnosis of SARS-CoV-2 by FDA under an Emergency Use Authorization (EUA). This EUA will remain in effect (meaning this test can be used) for the duration of the COVID-19 declaration under Section 564(b)(1) of the Act, 21 U.S.C. section 360bbb-3(b)(1), unless the authorization is terminated or revoked.     Resp Syncytial Virus by PCR NEGATIVE NEGATIVE Final    Comment: (NOTE) Fact Sheet for Patients: bloggercourse.com  Fact Sheet for Healthcare Providers: seriousbroker.it  This test is not yet approved or cleared by the [place]  FDA and has been  authorized for detection and/or diagnosis of SARS-CoV-2 by FDA under an Emergency Use Authorization (EUA). This EUA will remain in effect (meaning this test can be used) for the duration of the COVID-19 declaration under Section 564(b)(1) of the Act, 21 U.S.C. section 360bbb-3(b)(1), unless the authorization is terminated or revoked.  Performed at [facility], [address] [place], [place] [address]   Blood Culture (routine x 2)     Status: None (Preliminary result)   Collection Time: 09/17/24  8:44 PM   Specimen: BLOOD  Result Value Ref Range Status   Specimen Description BLOOD BLOOD RIGHT HAND  Final   Special Requests   Final    BOTTLES DRAWN AEROBIC AND ANAEROBIC Blood Culture adequate volume   Culture   Final    NO GROWTH < 12 HOURS Performed at [facility], [address], [place], [place] [address]  Report Status PENDING  Incomplete     Radiology Studies: CT Angio Chest PE W and/or Wo Contrast Result Date: 09/17/2024 EXAM: CTA CHEST 09/17/2024 10:07:39 PM TECHNIQUE: CTA of the chest was performed after the administration of intravenous contrast. Multiplanar reformatted images are provided for review. MIP images are provided for review. Automated exposure control, iterative reconstruction, and/or weight based adjustment of the mA/kV was utilized to reduce the radiation dose to as low as reasonably achievable. COMPARISON: None available. CLINICAL HISTORY: Lung cancer patient with right upper lobe neoplasm presenting with hypoxemia and respiratory distress. Pulmonary embolism (PE) suspected, high prob. FINDINGS: PULMONARY ARTERIES: Pulmonary arteries are adequately opacified for evaluation. The pulmonary arteries are slightly prominent but no arterial embolus is seen. MEDIASTINUM: There is mild cardiomegaly with a slight right chamber predominance with RV/LV ratio of 1.06. There is no pericardial effusion. There is atherosclerosis in the aorta, great vessels,  small amount in the left main and proximal LAD coronary artery. No aortic dissection or aneurysm. The pulmonary veins are nondilated. There is mild chronic enlargement and heterogeneity of the thyroid  gland without a dominant nodule. There is a chronically patulous esophagus without wall thickening. IVC filter in place. Left chest mediport with IJ approach catheter terminating in the SVC. LYMPH NODES: Subcarinal adenopathy is unchanged up to 1.2 cm on axial 58 of series 6, with increased prominence of AP window and left hilar nodes up to 1.2 cm, increased prominence of right hilar nodes up to 1.5 cm on axial 67. LUNGS AND PLEURA: Lungs are emphysematous. There is mild layering fluid in the distal trachea. There is layering fluid in the bilateral main bronchi, on the left extending into and obstructing the main lower lobe and lower lobe segmental bronchi, with consolidation throughout the left lower lobe consistent with a consolidated pneumonia. There is a right upper lobe suprahilar mass encasing central bronchovascular structures with bronchial narrowing. It is difficult to distinguish between the mass and adjacent pulmonary consolidation. In total, the mass and consolidation today measure 5 x 6.6 cm on axial 47 of series 6, not grossly changed from the PET CT but certainly significantly larger than on 05/24/2024. There is surrounding spiculation and stranding to pleural surfaces and areas of pleural thickening in the right chest superiorly and medially probably due to pleural metastatic disease and hypermetabolic on the PET CT. There is a small layering right pleural effusion with partial loculation in the superior chest. In the right lower lobe, several coarsely nodular opacities are again noted, consistent with patchy airspace disease versus intraorgan metastases. The largest of these is 2.3 x 1.9 cm on series 8 axial 82, unchanged. Elsewhere, there is progressive diffuse interstitial thickening and  micronodularity relatively sparing the right middle lobe and first seen on PET CT but worsened since. 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 No pneumothorax. UPPER ABDOMEN: The gallbladder is dilated and there are tiny layering stones posteriorly without wall thickening. There are stones in the right renal collecting system and renal pelvis, largest is 6 mm just above the UPJ. There is mild right hydronephrosis. The liver is steatotic. There is a 2 cm Bosniak 1 cyst in the right upper pole. SOFT TISSUES AND BONES: There is osteopenia and kyphosis of the thoracic spine. No destructive bone lesions. No acute soft tissue abnormality. IMPRESSION: 1. No evidence of pulmonary embolism. 2. Left lower lobe consolidation with mucus impaction and complete obstruction of the left lower lobe bronchus, consistent  with pneumonia. 3. Cardiomegaly with right chamber predominance indicating right heart dysfunction. the pulmonary arteries are slightly prominent. 4. Fluid in the main bronchi, obstructing the left lower lobe main and segmental bronchi with complete consolidation/ atelectasis of the left lower lobe. 5. Right upper lobe suprahilar mass with bronchovascular encasement and bronchial narrowing, overall size approximately 5 x 6.6 cm, not grossly changed from recent PET CT but increased from 05/24/2024, with associated pleural thickening suspicious for pleural metastatic disease and small partially loculated right pleural effusion . 6. 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 7. Mediastinal and hilar adenopathy with interval increase in AP window and bilateral hilar nodes up to 1.5 cm and stable subcarinal node up to 1.2 cm. 8. Right nephrolithiasis including a 6 mm renal pelvis just above the UPJ with mild right hydronephrosis. 9. Emphysema, with aortic and coronary  atherosclerosis. Electronically signed by: Francis Quam MD 09/17/2024 10:53 PM EST RP Workstation: [id number]   DG Chest Port 1 View Result Date: 09/17/2024 EXAM: 1 VIEW(S) XRAY OF THE CHEST 09/17/2024 07:45:00 PM COMPARISON: 09/02/2024 CLINICAL HISTORY: Questionable sepsis - evaluate for abnormality FINDINGS: LINES, TUBES AND DEVICES: Left-sided port-a-cath tip overlies mid SVC. LUNGS AND PLEURA: Alveolar consolidation right upper lung and diffusely on the left. Small left-sided pleural effusion. No pneumothorax. HEART AND MEDIASTINUM: No acute abnormality of the cardiac and mediastinal silhouettes. BONES AND SOFT TISSUES: No acute osseous abnormality. IMPRESSION: 1. Alveolar consolidation in the right upper lung and diffusely on the left. 2. Small left-sided pleural effusion. Electronically signed by: Fonda Field MD 09/17/2024 07:48 PM EST RP Workstation: [id number]    Scheduled Meds:  (feeding supplement) PROSource Plus  30 mL Oral BID BM   aspirin  EC  81 mg Oral Daily   atorvastatin   20 mg Oral Daily   busPIRone  20 mg Oral BID   calcium  carbonate  1 tablet Oral Daily   collagenase  1 Application Topical Daily   enoxaparin  (LOVENOX ) injection  40 mg Subcutaneous Q24H   feeding supplement (GLUCERNA SHAKE)  237 mL Oral BID BM   ferrous sulfate  325 mg Oral BID WC   furosemide   20 mg Oral Daily   guaiFENesin   600 mg Oral BID   insulin  aspart  0-20 Units Subcutaneous TID WC   insulin  aspart  0-5 Units Subcutaneous QHS   insulin  aspart  4 Units Subcutaneous TID WC   insulin  glargine-yfgn  18 Units Subcutaneous Daily   ipratropium-albuterol   3 mL Nebulization QID   levETIRAcetam   500 mg Oral BID   levothyroxine   125 mcg Oral Q0600   lidocaine   1 patch Transdermal Q24H   linagliptin   5 mg Oral Daily   loratadine   10 mg Oral Daily   losartan  25 mg Oral Daily   meclizine   12.5 mg Oral BID   methylPREDNISolone  (SOLU-MEDROL ) injection  80 mg Intravenous Q24H   metoprolol succinate  50  mg Oral Daily   midodrine  10 mg Oral TID WC   multivitamin with minerals  1 tablet Oral Daily   nutrition supplement (JUVEN)  1 packet Oral BID BM   nystatin  1 Application Topical BID   pantoprazole   40 mg Oral BID AC   polyethylene glycol  17 g Oral Daily   pregabalin   50 mg Oral BID   rOPINIRole   4 mg Oral QHS   senna  1 tablet Oral BID   zinc oxide  1 Application Topical BID   Continuous Infusions:  azithromycin      cefTRIAXone  (ROCEPHIN )  IV     lactated ringers  150 mL/hr (09/18/24 0727)     LOS: 1 day  MDM: Patient is high risk for one or more organ failure.  They necessitate ongoing hospitalization for continued IV therapies and subsequent lab monitoring. Total time spent interpreting labs and vitals, reviewing the medical record, coordinating care amongst consultants and care team members, directly assessing and discussing care with the patient and/or family: 55 min Laree Lock, MD [facility]  To contact the attending physician between 7A-7P please use Epic Chat. To contact the covering physician during after hours 7P-7A, please review Amion.  09/18/2024, 10:08 AM   *This document has been created with the assistance of dictation software. Please excuse typographical errors. *

## 2024-09-19 DIAGNOSIS — J189 Pneumonia, unspecified organism: Secondary | ICD-10-CM | POA: Diagnosis not present

## 2024-09-19 DIAGNOSIS — A419 Sepsis, unspecified organism: Secondary | ICD-10-CM | POA: Diagnosis not present

## 2024-09-19 LAB — CBC
HCT: 30.7 % — ABNORMAL LOW (ref 36.0–46.0)
Hemoglobin: 9.2 g/dL — ABNORMAL LOW (ref 12.0–15.0)
MCH: 25.3 pg — ABNORMAL LOW (ref 26.0–34.0)
MCHC: 30 g/dL (ref 30.0–36.0)
MCV: 84.6 fL (ref 80.0–100.0)
Platelets: 223 K/uL (ref 150–400)
RBC: 3.63 MIL/uL — ABNORMAL LOW (ref 3.87–5.11)
RDW: 17 % — ABNORMAL HIGH (ref 11.5–15.5)
WBC: 6.2 K/uL (ref 4.0–10.5)
nRBC: 0 % (ref 0.0–0.2)

## 2024-09-19 LAB — GLUCOSE, CAPILLARY
Glucose-Capillary: 143 mg/dL — ABNORMAL HIGH (ref 70–99)
Glucose-Capillary: 233 mg/dL — ABNORMAL HIGH (ref 70–99)
Glucose-Capillary: 246 mg/dL — ABNORMAL HIGH (ref 70–99)
Glucose-Capillary: 229 mg/dL — ABNORMAL HIGH (ref 70–99)

## 2024-09-19 LAB — BASIC METABOLIC PANEL WITH GFR
Anion gap: 6 (ref 5–15)
BUN: 36 mg/dL — ABNORMAL HIGH (ref 8–23)
CO2: 35 mmol/L — ABNORMAL HIGH (ref 22–32)
Calcium: 11.4 mg/dL — ABNORMAL HIGH (ref 8.9–10.3)
Chloride: 99 mmol/L (ref 98–111)
Creatinine, Ser: 0.78 mg/dL (ref 0.44–1.00)
GFR, Estimated: >60 mL/min (ref >60)
Glucose, Bld: 143 mg/dL — ABNORMAL HIGH (ref 70–99)
Potassium: 4.3 mmol/L (ref 3.5–5.1)
Sodium: 140 mmol/L (ref 135–145)

## 2024-09-19 MED ORDER — METHYLPREDNISOLONE SODIUM SUCC 125 MG IJ SOLR
60.0000 mg | INTRAMUSCULAR | Status: DC
  Administered 2024-09-19 – 2024-09-22 (×4): 60 mg via INTRAVENOUS
  Filled 2024-09-19 (×4): qty 2

## 2024-09-19 MED ORDER — FUROSEMIDE 20 MG PO TABS: 20.0000 mg | ORAL_TABLET | Freq: Every day | ORAL | Status: DC

## 2024-09-19 MED ORDER — MIDODRINE HCL 5 MG PO TABS: 10.0000 mg | ORAL_TABLET | Freq: Three times a day (TID) | ORAL | Status: DC | PRN

## 2024-09-19 MED ORDER — FUROSEMIDE 10 MG/ML IJ SOLN
40.0000 mg | Freq: Once | INTRAMUSCULAR | Status: AC
  Administered 2024-09-19: 40 mg via INTRAVENOUS
  Filled 2024-09-19: qty 4

## 2024-09-19 MED ORDER — IPRATROPIUM-ALBUTEROL 0.5-2.5 (3) MG/3ML IN SOLN
3.0000 mL | Freq: Three times a day (TID) | RESPIRATORY_TRACT | Status: DC
  Administered 2024-09-19 – 2024-09-21 (×6): 3 mL via RESPIRATORY_TRACT
  Filled 2024-09-19 (×6): qty 3

## 2024-09-19 MED ORDER — ACETAMINOPHEN 325 MG PO TABS: 650.0000 mg | ORAL_TABLET | Freq: Four times a day (QID) | ORAL | Status: DC | PRN

## 2024-09-19 MED ORDER — FUROSEMIDE 20 MG PO TABS
20.0000 mg | ORAL_TABLET | Freq: Every day | ORAL | Status: DC
  Administered 2024-09-20 – 2024-09-22 (×3): 20 mg via ORAL
  Filled 2024-09-19 (×3): qty 1

## 2024-09-19 MED ORDER — SPIRONOLACTONE 25 MG PO TABS
25.0000 mg | ORAL_TABLET | Freq: Every day | ORAL | Status: DC
  Administered 2024-09-19 – 2024-09-22 (×4): 25 mg via ORAL
  Filled 2024-09-19 (×4): qty 1

## 2024-09-19 MED ORDER — ACETAMINOPHEN 650 MG RE SUPP: 650.0000 mg | Freq: Four times a day (QID) | RECTAL | Status: DC | PRN

## 2024-09-19 NOTE — Evaluation (Signed)
 Clinical/Bedside Swallow Evaluation Patient Details  Name: Michele House MRN: [id number] Date of Birth: Jan 08, 1956  Today's Date: 09/19/2024 Time: SLP Start Time (ACUTE ONLY): 1030 SLP Stop Time (ACUTE ONLY): 1130 SLP Time Calculation (min) (ACUTE ONLY): 60 min  Past Medical History:  Past Medical History:  Diagnosis Date   Acid reflux    COPD (chronic obstructive pulmonary disease) (HCC)    Diabetes mellitus without complication (HCC)    Hyperlipidemia    Hypertension    Stroke (HCC)    Thyroid  disease    Past Surgical History:  Past Surgical History:  Procedure Laterality Date   appendectomy     APPENDECTOMY     BREAST BIOPSY Right    CORE W/CLIP - NEG   ESOPHAGOGASTRODUODENOSCOPY N/A 10/28/2023   Procedure: ESOPHAGOGASTRODUODENOSCOPY (EGD);  Surgeon: Jinny Carmine, MD;  Location: [facility] ENDOSCOPY;  Service: Endoscopy;  Laterality: N/A;   FLEXIBLE BRONCHOSCOPY Bilateral 11/12/2023   Procedure: FLEXIBLE BRONCHOSCOPY;  Surgeon: Parris Manna, MD;  Location: [facility] ORS;  Service: Thoracic;  Laterality: Bilateral;   IR EMBO ART  VEN HEMORR LYMPH EXTRAV  INC GUIDE ROADMAPPING  10/30/2023   IR IMAGING GUIDED PORT INSERTION  01/18/2024   IVC FILTER INSERTION N/A 11/09/2023   Procedure: IVC FILTER INSERTION;  Surgeon: Marea Selinda RAMAN, MD;  Location: [facility] INVASIVE CV LAB;  Service: Cardiovascular;  Laterality: N/A;   TOTAL VAGINAL HYSTERECTOMY     TUMOR REMOVAL     benign;stomach   VIDEO BRONCHOSCOPY WITH ENDOBRONCHIAL NAVIGATION Right 10/20/2023   Procedure: VIDEO BRONCHOSCOPY WITH ENDOBRONCHIAL NAVIGATION;  Surgeon: Parris Manna, MD;  Location: [facility] ORS;  Service: Thoracic;  Laterality: Right;   VIDEO BRONCHOSCOPY WITH ENDOBRONCHIAL NAVIGATION Right 11/12/2023   Procedure: VIDEO BRONCHOSCOPY WITH ENDOBRONCHIAL NAVIGATION;  Surgeon: Parris Manna, MD;  Location: [facility] ORS;  Service: Thoracic;  Laterality: Right;   HPI:  Pt is a 68 y.o. female with medical history significant of  COPD Gold stage III status post chemo and radiation therapy, on maintenance immune therapy, chronic hypoxic respiratory failure on 2 L continuously, RUL stage III non-small cell Lung Cancer status post chronic HFrEF with LVEF 30% July 2025, HTN, obesity, HLD, IDDM, hypothyroidism, diabetic neuropathy, anxiety/depression.  She presented to the emergency room from [facility] with acute onset of abnormal CO2 41.  This has been coinciding with worsening dyspnea and hypoxia with a pulse currently of 85% on 4 L O2 by nasal cannula.  She was admitted here from 10/31 till 09/04/2024 for management of COPD exacerbation and acute on chronic HFrEF with acute on chronic hypoxic and hypercarbic respiratory failure.  Per pt and PT report, pt is essentially bed bound at baseline and requires hoyer lift transfers to sit up. She does not get out of bed routinely. When she does get to the wheelchair, she cannot sit longer than a few minutes per her report. She needs assistance for ADLs as well.   CT of Chest: Lungs are emphysematous. There is mild layering fluid in the distal trachea.  There is layering fluid in the bilateral main bronchi, on the left extending  into and obstructing the main lower lobe and lower lobe segmental bronchi, with  consolidation throughout the left lower lobe consistent with a consolidated  pneumonia.  There is a right upper lobe suprahilar mass encasing central bronchovascular structures with bronchial narrowing. It is difficult to distinguish between the mass and adjacent pulmonary  consolidation. The Right upper lobe suprahilar mass with bronchovascular encasement and bronchial narrowing, overall size  approximately 5 x 6.6 cm, not grossly changed  from recent PET CT but increased from 05/24/2024, with associated pleural  thickening suspicious for pleural metastatic disease and small partially  loculated right pleural effusion.    Progressive diffuse interstitial thickening and micronodularity,  most consistent with lymphangitic carcinomatosis.  See CT for further.    Assessment / Plan / Recommendation  Clinical Impression   Pt seen for BSE. pt awake, verbally engaged but was often easily distracted and required redirection to tasks. Pt answered social pleasantries appropriately but hesitated w/ more direct questions. Pt followed instructions w/ cues. She appeared weak and easily SOB w/ any exertion including talking. Missing Most Dentition. Pt had ~17-18 Pills to swallow this morning w/ NSG during this assessment.  Unsure of pt's Cognitive status/function in setting of Cancer/txs. Recommend further f/u w/ Neurology if indicated.  On [place] O2 support 4L, afebrile. WBC WNL.  Pt appears to present w/ grossly functional oropharyngeal phase swallowing w/ No overt oropharyngeal phase dysphagia noted, No neuromuscular deficits noted. However, pt is Missing Many Dentition impacting her ability to effectively masticate solid foods. Pt consumed po trials w/ No immediate, overt clinical s/s of aspiration during po trials including Pill swallowing w/ all but 1 Pill- pt had ~17-18 Pills to swallowing during morning Med pass w/ NSG.  Pt appears at reduced risk for aspiration when following general aspiration precautions. However, pt does have challenging factors that could impact her oropharyngeal swallowing to include declined Pulmonary status- Chronic w/ quick WOB/SOB w/ ANY exertion including talking, deconditioning/fatigue/weakness, weak RUE hampering her self-feeding abilities(need for setup and support w/ meals d/t UE weakness), and Missing Many Dentition. Also noted mild distraction/confusion Cognitive presentation. These factors can increase risk for aspiration, dysphagia as well as decreased oral intake overall.   During po trials, pt consumed all consistencies w/ no immediate, overt coughing, decline in vocal quality, or change in respiratory presentation during/post trials. O2 sats 97% when checked.  OF NOTE: coughing occurred 1x w/ Pill swallowing/water  after Multiple boluses/Pills swallowed(pt has ~17-18 Pills to swallow). Oral phase appeared grossly [facility] w/ timely bolus management and control of bolus propulsion for A-P transfer for swallowing w/ liquids, Pills in water /liquid, and purees. Min increased mastication/oral phase time needed for trials of increased texture- increased mastication/mashing time. Oral clearing achieved w/ all trial consistencies given time -- moistened, cut/chopped foods given.  OM Exam appeared [facility] w/ no unilateral weakness noted. Speech Clear. Pt fed self w/ FULL setup support.   Recommend continue a fairly Regular>Mech Soft consistency diet w/ well-Cut and/or Chopped meats, moistened foods; Thin liquids -- carefully monitor straw use, and pt should Hold Cup when drinking. Recommend general aspiration precautions including Rest Breaks during meals for conservation of energy. Avoid overly tough and chewy foods d/t exertion. Recommend FULL tray setup and support w/ eating at meals d/t using only the LUE to self-feed- OT f/u to address and suggest options. Small sips/bites slowly. Sit FULLY upright for all oral intake.  Pills 1-3 at a time w/ Water  vs WHOLE in Puree if needed for safer, easier swallowing -- pt practiced this during this session.   Education given on Pills in Puree; food consistencies and easy to eat options; general aspiration precautions and the above to pt and NSG. ST services will f/u 1x for toleration of diet. NSG updated, agreed. MD updated. Recommend Dietician f/u for support. Recommend Palliative Care consult for GOC especially addressing polypharmacy and code status. Precautions posted in chart, room.  SLP Visit Diagnosis: Dysphagia, unspecified (R13.10) (in setting of Missing Many Dentition; declined Pulmonary status- Chronic; need for setup and support w/ meals d/t UE weakness; mild distractions/confusion)    Aspiration Risk  Mild aspiration  risk;Risk for inadequate nutrition/hydration (reduced when following general aspiration precautions)    Diet Recommendation   Thin;Age appropriate regular;Dysphagia 3 (mechanical soft) (chopped meats/foods moistened) = a fairly Regular>Mech Soft consistency diet w/ well-Cut and/or Chopped meats, moistened foods; Thin liquids -- carefully monitor straw use, and pt should Hold Cup when drinking. Recommend general aspiration precautions including Rest Breaks during meals for conservation of energy. Avoid overly tough and chewy foods d/t exertion. Recommend FULL tray setup and support w/ eating at meals d/t using only the LUE to self-feed- OT f/u to address and suggest options. Small sips/bites slowly. Sit FULLY upright for all oral intake.   Medication Administration: Whole meds with puree (as needed for safer, easier swallowing)    Other  Recommendations Recommended Consults:  (Dietician support; Palliative Care for GOC) Oral Care Recommendations: Oral care BID;Patient independent with oral care;Staff/trained caregiver to provide oral care (setup support)     Assistance Recommended at Discharge  Intermittent>full at meals  Functional Status Assessment Patient has had a recent decline in their functional status and/or demonstrates limited ability to make significant improvements in function in a reasonable and predictable amount of time  Frequency and Duration min 1 x/week  1 week       Prognosis Prognosis for improved oropharyngeal function: Fair Barriers to Reach Goals: Cognitive deficits;Time post onset;Severity of deficits Barriers/Prognosis Comment: Missing Many Dentition; declined Pulmonary status- Chronic; need for setup and support w/ meals d/t UE weakness; mild distractions/confusion      Swallow Study   General Date of Onset: 09/17/24 HPI: Pt is a 68 y.o. female with medical history significant of COPD Gold stage III status post chemo and radiation therapy, on maintenance immune  therapy, chronic hypoxic respiratory failure on 2 L continuously, RUL stage III non-small cell Lung Cancer status post chronic HFrEF with LVEF 30% July 2025, HTN, obesity, HLD, IDDM, hypothyroidism, diabetic neuropathy, anxiety/depression.  She presented to the emergency room from [facility] with acute onset of abnormal CO2 41.  This has been coinciding with worsening dyspnea and hypoxia with a pulse currently of 85% on 4 L O2 by nasal cannula.  She was admitted here from 10/31 till 09/04/2024 for management of COPD exacerbation and acute on chronic HFrEF with acute on chronic hypoxic and hypercarbic respiratory failure.  Per pt and PT report, pt is essentially bed bound at baseline and requires hoyer lift transfers to sit up. She does not get out of bed routinely. When she does get to the wheelchair, she cannot sit longer than a few minutes per her report. She needs assistance for ADLs as well.   CT of Chest: Lungs are emphysematous. There is mild layering fluid in the distal trachea.  There is layering fluid in the bilateral main bronchi, on the left extending  into and obstructing the main lower lobe and lower lobe segmental bronchi, with  consolidation throughout the left lower lobe consistent with a consolidated  pneumonia.  There is a right upper lobe suprahilar mass encasing central bronchovascular structures with bronchial narrowing. It is difficult to distinguish between the mass and adjacent pulmonary  consolidation. The Right upper lobe suprahilar mass with bronchovascular encasement and bronchial narrowing, overall size approximately 5 x 6.6 cm, not grossly changed  from recent PET CT  but increased from 05/24/2024, with associated pleural  thickening suspicious for pleural metastatic disease and small partially  loculated right pleural effusion.    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  See CT for further. Type of Study:  Bedside Swallow Evaluation Previous Swallow Assessment: 11/2023- regular diet; 10/2023 Diet Prior to this Study: Regular;Thin liquids (Level 0) Temperature Spikes Noted: No (wbc 6.2) Respiratory Status: Nasal cannula (4L) History of Recent Intubation: No Behavior/Cognition: Alert;Cooperative;Pleasant mood;Confused;Distractible;Requires cueing (unsure of her Baseline Cognitive status) Oral Cavity Assessment: Within Functional Limits Oral Care Completed by SLP: Recent completion by staff Oral Cavity - Dentition: Poor condition;Missing dentition (Missing Most) Vision: Functional for self-feeding Self-Feeding Abilities: Able to feed self;Needs assist;Needs set up (weak RUE, but able to use LUE to self feed) Patient Positioning: Upright in bed (needed support for midline, upright sitting) Baseline Vocal Quality: Normal Volitional Cough: Strong;Congested Volitional Swallow: Able to elicit    Oral/Motor/Sensory Function Overall Oral Motor/Sensory Function: Within functional limits (no unilateral weakness)   Ice Chips Ice chips: Not tested   Thin Liquid Thin Liquid: Within functional limits Presentation: Cup;Self Fed;Straw (10+ trials) Other Comments: then trials when swallowing Pills- cough noted post swallow 1x after several Pills    Nectar Thick Nectar Thick Liquid: Not tested   Honey Thick Honey Thick Liquid: Not tested   Puree Puree: Within functional limits Presentation: Self Fed;Spoon (supported; 8+ trials)   Solid     Solid: Impaired (few Dentition) Presentation: Self Fed (5 trials) Oral Phase Impairments: Impaired mastication (few Dentition) Oral Phase Functional Implications:  (time needed) Pharyngeal Phase Impairments:  (none) Other Comments: moistened foods       Comer Portugal, MS, CCC-SLP Speech Language Pathologist Rehab Services; [facility] [phone] (ascom) Elajah Kunsman 09/19/2024,5:48 PM

## 2024-09-19 NOTE — Evaluation (Signed)
 Occupational Therapy Evaluation Patient Details Name: Michele House MRN: [id number] DOB: 10/08/1956 Today's Date: 09/19/2024   History of Present Illness   Patient is a 68 year old female with sepsis due to pneumonia, complete atelectasis of left lower lobe. PMH:  diabetes, morbid obesity, CVA advanced systolic CHF and COPD, chronic hypoxemia, dyslipidemia, thyroid  dysfunction, non-small cell lung cancer     Clinical Impressions Pt seen for OT evaluation. She is a long term resident at [facility]. She is essentially bed bound at baseline and requires hoyer lift transfers to sit up. She does not get out of bed routinely. When she does get to the wheelchair, she cannot sit longer than a few minutes per her report. She needs assistance for ADLs as well. Has historically been able to self feed using dom L hand but reports feeling weaker lately with this. Pt required extensive +2 assistance to sit up on edge of bed. Sitting balance required initially +2 person assistance, with brief periods of one person assistance. She has right side weakness, but is also generally weak throughout. Sp02 remained at 94% on 4 L02 while sitting with no significant dyspnea noted. Given limited pre-morbid functional status and no overt change from baseline for self feeding, no acute OT needs are identified at this time. May benefit from follow up therapy back at facility. Anticipate patient can return to previous environment. Will sign off at this time.     If plan is discharge home, recommend the following:   Two people to help with walking and/or transfers;Two people to help with bathing/dressing/bathroom;Assistance with cooking/housework;Assistance with feeding;Help with stairs or ramp for entrance;Assist for transportation;Direct supervision/assist for medications management;Direct supervision/assist for financial management;Supervision due to cognitive status     Functional Status Assessment   Patient has had a  recent decline in their functional status and/or demonstrates limited ability to make significant improvements in function in a reasonable and predictable amount of time     Equipment Recommendations   None recommended by OT     Recommendations for Other Services         Precautions/Restrictions   Precautions Precautions: Fall Recall of Precautions/Restrictions: Impaired Restrictions Weight Bearing Restrictions Per Provider Order: No     Mobility Bed Mobility Overal bed mobility: Needs Assistance Bed Mobility: Supine to Sit, Sit to Supine, Rolling Rolling: Max assist, +2 for physical assistance   Supine to sit: Max assist, +2 for physical assistance Sit to supine: Max assist, +2 for physical assistance   General bed mobility comments: significant assistance required to sit up on edge of bed. cues for technique, initiation. assistance for BLE and trunk support    Transfers                   General transfer comment: not attempted. poor sitting balance. patient required hoyer lift transfer at baseline.      Balance Overall balance assessment: Needs assistance Sitting-balance support: Feet supported Sitting balance-Leahy Scale: Zero Sitting balance - Comments: Max A mostly, up to +2 person assistance. with cues to use LUE support on bed rail, patient was briefly able to maintain sitting balance with Min A. right side and posterior lean Postural control: Right lateral lean, Posterior lean                                 ADL either performed or assessed with clinical judgement   ADL Overall ADL's : At baseline  General ADL Comments: Pt near baseline, was able to self feed breakfast, MAX A +1-2 for all other ADL at bed level     Vision         Perception         Praxis         Pertinent Vitals/Pain Pain Assessment Pain Assessment: No/denies pain     Extremity/Trunk  Assessment Upper Extremity Assessment Upper Extremity Assessment: Generalized weakness;Left hand dominant (right side appears weaker than left side (from prior CVA per patient report))   Lower Extremity Assessment Lower Extremity Assessment: Defer to PT evaluation;RLE deficits/detail;LLE deficits/detail RLE Deficits / Details: weakness compared to left side. unable to SLR. trace movement noted at the hip/knee/ankle. patient reports residual wekaness from prior stroke LLE Deficits / Details: patient able to SLR independently. generally weak throughout       Communication Communication Communication: No apparent difficulties Factors Affecting Communication: Difficulty expressing self   Cognition Arousal: Alert Behavior During Therapy: WFL for tasks assessed/performed Cognition: History of cognitive impairments, No family/caregiver present to determine baseline                               Following commands: Impaired Following commands impaired: Only follows one step commands consistently     Cueing  General Comments   Cueing Techniques: Verbal cues;Tactile cues;Visual cues  SpO2 94% on baseline 4L O2, HR in 70's, denies SOB with sitting   Exercises     Shoulder Instructions      Home Living Family/patient expects to be discharged to:: Skilled nursing facility                                 Additional Comments: LTC at [facility], not currently getting therapy per her report      Prior Functioning/Environment Prior Level of Function : Needs assist             Mobility Comments: has bee non ambulatory for at least a year. she requires a hoyer lift to get out of bed and does not get up daily. assistance required for wheelchair mobility. ADLs Comments: assistance required for ADLs. she reports recent need for feeding assistance    OT Problem List: Decreased strength;Cardiopulmonary status limiting activity;Decreased activity tolerance;Impaired  balance (sitting and/or standing);Impaired UE functional use;Decreased knowledge of use of DME or AE   OT Treatment/Interventions:        OT Goals(Current goals can be found in the care plan section)   Acute Rehab OT Goals Patient Stated Goal: get better OT Goal Formulation: All assessment and education complete, DC therapy   OT Frequency:       Co-evaluation PT/OT/SLP Co-Evaluation/Treatment: Yes Reason for Co-Treatment: For patient/therapist safety;To address functional/ADL transfers PT goals addressed during session: Mobility/safety with mobility;Balance OT goals addressed during session: ADL's and self-care      AM-PAC OT 6 Clicks Daily Activity     Outcome Measure Help from another person eating meals?: None Help from another person taking care of personal grooming?: A Lot Help from another person toileting, which includes using toliet, bedpan, or urinal?: Total Help from another person bathing (including washing, rinsing, drying)?: Total Help from another person to put on and taking off regular upper body clothing?: Total Help from another person to put on and taking off regular lower body clothing?: Total 6 Click Score: 10   End  of Session Equipment Utilized During Treatment: Oxygen  Activity Tolerance: Patient tolerated treatment well Patient left: in bed;with call bell/phone within reach;with bed alarm set  OT Visit Diagnosis: Muscle weakness (generalized) (M62.81)                Time: 9153-9096 OT Time Calculation (min): 17 min Charges:  OT General Charges $OT Visit: 1 Visit OT Evaluation $OT Eval Low Complexity: 1 Low  Warren SAUNDERS., MPH, MS, OTR/L ascom [phone] 09/19/24, 11:43 AM

## 2024-09-19 NOTE — Progress Notes (Addendum)
 PROGRESS NOTE    Michele House  FMW:[id number] DOB: 01/13/1956 DOA: 09/17/2024 PCP: [facility]  Chief Complaint  Patient presents with   Abnormal Lab    SNF reported 70    Hospital Course:  Michele House is a 68 y.o. 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  On presentation also had worsening dyspnea, hypoxia.  Patient admitted for sepsis due to pneumonia, hospital course as below  Subjective: Patient was examined at bedside, appears comfortable, answering questions appropriately Reports having SOB, appears hypervolemic -Will give 1 dose IV Lasix  today Called daughter [name] (as per son's Alm), provided updates and answered all questions   Objective: Vitals:   09/18/24 1746 09/18/24 1955 09/18/24 2038 09/19/24 0442  BP: 106/60  109/64 (!) 115/59  Pulse: 81  84 72  Resp: 20  18 20   Temp: 98.2 F (36.8 C)  98.4 F (36.9 C) 97.8 F (36.6 C)  TempSrc: Oral  Oral   SpO2: 96% 96% 96% 97%  Weight:      Height:        Intake/Output Summary (Last 24 hours) at 09/19/2024 1142 Last data filed at 09/19/2024 0400 Gross per 24 hour  Intake --  Output 1300 ml  Net -1300 ml   Filed Weights   09/17/24 1846  Weight: 70.2 kg    Examination: GENERAL:  68 y.o.-year-old patient lying in the bed with no acute distress.  NECK:  Supple, no jugular venous distention. No thyroid  enlargement, no tenderness.  LUNGS: Diminished bibasilar breath sounds with bibasal and midlung zone crackles.  She has associated expiratory wheezes with diminished expiratory airflow and heart vesicular breathing.  No use of accessory muscles of respiration.  CARDIOVASCULAR:  Regular rate and rhythm, S1, S2 normal. No murmurs, rubs, or gallops.  ABDOMEN: Soft, nondistended, nontender. Bowel sounds present. No organomegaly or mass.  EXTREMITIES: No pedal edema, cyanosis, or clubbing.  NEUROLOGIC: Cranial nerves II through XII are intact. Muscle strength 5/5 in all extremities. Sensation intact. Gait not checked.  PSYCHIATRIC: Awake, alert  Assessment & Plan:  Sepsis due to pneumonia Complete atelectasis left lower lobe Non-small cell lung cancer - LA normal, No leukocytosis - Flu/COVID/RSV negative, UA with squamous cells (likely contaminant) - CTA LLL consolidation with mucus impaction and complete obstruction of LLL bronchus.  Fluid in the main bronchi, complete consolidation/atelectasis of left lower lobe, Cardiomegaly with right chamber prominence indicating right heart dysfunction. RUL suprahilar mass bronchovascular encasement and bronchial narrowing, size approximately 5 into 6.6 cm, not grossly changed -with associated pleural thickening suspicious for pleural metastatic disease. Progressive diffuse interstitial thickening and micronodularity, consistent with lymphangitic carcinomatosis. Mediastinal and hilar adenopathy - Continue IV Ceftriaxone , Azithromycin  - Mucomyst nebulized solution, hypertonic saline nebs - Seen by pulmonary, appreciate recs.  Plan for bronchoscopy this hospitalization - Seen by SLP, no overt symptoms/signs of aspiration.  - Follow blood cultures   COPD with acute exacerbation - Continue IV Solu-Medrol , DuoNebs.  On azithromycin   Acute on chronic respiratory failure with hypoxia and hypercapnia - Likely secondary to pneumonia and COPD exacerbation - Wean O2 as able  Mild hyperkalemia - resolved - s/p Lokelma  5g  Mild right hydronephrosis - Imaging shows Right nephrolithiasis  including a 6 mm renal pelvis just above the UPJ with mild right hydronephrosis - If new/ worsening right flank pain -recommend CT stone protocol,  otherwise can follow-up outpatient - Seen by urology, appreciate recs  Chronic HFrEF (heart failure with reduced ejection fraction) - S/p IV fluids on admission, 1 dose IV Lasix  today.  Resume home Lasix , spironolactone - Continue metoprolol, Tradjenta   Dementia - Supportive care   History of right leg DVT, status post IVC filter - Happened January of this year, IVC filter placed instead of anticoagulation due to concurrent severe GI bleed.     History of duodenal ulcer and upper GI bleed  - During same hospitalization in January, patient also developed severe duodenum bleeding, subsequently underwent EGD and clipping but clipping failed and patient received IR embolization 2 days later. - Monito Hb   Seizure disorder - Continue Keppra    IDDM - Continue Lantus  18units, Novolog  4u TID, SSI - Also on steroids  -- Palliative consult for GOC (code status) - as patient has poor pulmonary reserve and cognitive decline -- PT/OT eval  DVT prophylaxis: Lovenox  SQ   Code Status: Full Code Disposition:  TBD  Consultants:  Treatment Team:  Consulting Physician: Parris Manna, MD Consulting Physician: Geralynn Charleston, MDPulmonary Urology  Procedures:  None  Antimicrobials:  Anti-infectives (From admission, onward)    Start     Dose/Rate Route Frequency Ordered Stop   09/18/24 2000  cefTRIAXone  (ROCEPHIN ) 2 g in sodium chloride  0.9 % 100 mL IVPB        2 g 200 mL/hr over 30 Minutes Intravenous Every 24 hours 09/17/24 2337 09/22/24 1959   09/18/24 2000  azithromycin  (ZITHROMAX ) 500 mg in sodium chloride  0.9 % 250 mL IVPB        500 mg 250 mL/hr over 60 Minutes Intravenous Every 24 hours 09/17/24 2337 09/22/24 1959   09/17/24 1915  cefTRIAXone  (ROCEPHIN ) 2 g in sodium chloride  0.9 % 100 mL IVPB        2 g 200 mL/hr over 30 Minutes Intravenous Once 09/17/24 1909 09/17/24 1954   09/17/24 1915  azithromycin  (ZITHROMAX ) 500 mg in sodium chloride  0.9 % 250 mL IVPB        500  mg 250 mL/hr over 60 Minutes Intravenous  Once 09/17/24 1909 09/17/24 2114       Data Reviewed: I have personally reviewed following labs and imaging studies CBC: Recent Labs  Lab 09/17/24 1855 09/18/24 0830 09/19/24 0518  WBC 6.2 6.7 6.2  NEUTROABS 4.9  --   --   HGB 10.0* 9.5* 9.2*  HCT 33.5* 32.0* 30.7*  MCV 86.8 87.9 84.6  PLT 210 164 223   Basic Metabolic Panel: Recent Labs  Lab 09/17/24 1855 09/18/24 0830 09/19/24 0518  NA 143 142 140  K 4.7 5.2* 4.3  CL 97* 102 99  CO2 38* 33* 35*  GLUCOSE 114* 205* 143*  BUN 28* 32* 36*  CREATININE 0.74 0.77 0.78  CALCIUM  11.7* 10.6* 11.4*   GFR: Estimated Creatinine Clearance: 58.7 mL/min (by C-G formula based on SCr of 0.78 mg/dL). Liver Function Tests: Recent Labs  Lab 09/17/24 1855  AST 34  ALT 24  ALKPHOS 83  BILITOT 0.3  PROT 5.8*  ALBUMIN 3.5   CBG: Recent Labs  Lab 09/18/24 0837 09/18/24 1255 09/18/24 1704 09/18/24 2044 09/19/24 0847  GLUCAP 208* 152* 192* 205* 143*    Recent Results (from the past 240 hours)  Blood Culture (routine x 2)  Status: None (Preliminary result)   Collection Time: 09/17/24  6:55 PM   Specimen: BLOOD  Result Value Ref Range Status   Specimen Description BLOOD LEFT ANTECUBITAL  Final   Special Requests   Final    BOTTLES DRAWN AEROBIC AND ANAEROBIC Blood Culture results may not be optimal due to an inadequate volume of blood received in culture bottles   Culture   Final    NO GROWTH 2 DAYS Performed at [facility], [address], [place], [place] [address]    Report Status PENDING  Incomplete  Resp panel by RT-PCR (RSV, Flu A&B, Covid) Anterior Nasal Swab     Status: None   Collection Time: 09/17/24  7:10 PM   Specimen: Anterior Nasal Swab  Result Value Ref Range Status   SARS Coronavirus 2 by RT PCR NEGATIVE NEGATIVE Final    Comment: (NOTE) SARS-CoV-2 target nucleic acids are NOT DETECTED.  The SARS-CoV-2 RNA is generally detectable in upper  respiratory specimens during the acute phase of infection. The lowest concentration of SARS-CoV-2 viral copies this assay can detect is 138 copies/mL. A negative result does not preclude SARS-Cov-2 infection and should not be used as the sole basis for treatment or other patient management decisions. A negative result may occur with  improper specimen collection/handling, submission of specimen other than nasopharyngeal swab, presence of viral mutation(s) within the areas targeted by this assay, and inadequate number of viral copies(<138 copies/mL). A negative result must be combined with clinical observations, patient history, and epidemiological information. The expected result is Negative.  Fact Sheet for Patients:  bloggercourse.com  Fact Sheet for Healthcare Providers:  seriousbroker.it  This test is no t yet approved or cleared by the [place]  FDA and  has been authorized for detection and/or diagnosis of SARS-CoV-2 by FDA under an Emergency Use Authorization (EUA). This EUA will remain  in effect (meaning this test can be used) for the duration of the COVID-19 declaration under Section 564(b)(1) of the Act, 21 U.S.C.section 360bbb-3(b)(1), unless the authorization is terminated  or revoked sooner.       Influenza A by PCR NEGATIVE NEGATIVE Final   Influenza B by PCR NEGATIVE NEGATIVE Final    Comment: (NOTE) The Xpert Xpress SARS-CoV-2/FLU/RSV plus assay is intended as an aid in the diagnosis of influenza from Nasopharyngeal swab specimens and should not be used as a sole basis for treatment. Nasal washings and aspirates are unacceptable for Xpert Xpress SARS-CoV-2/FLU/RSV testing.  Fact Sheet for Patients: bloggercourse.com  Fact Sheet for Healthcare Providers: seriousbroker.it  This test is not yet approved or cleared by the [place]  FDA and has been  authorized for detection and/or diagnosis of SARS-CoV-2 by FDA under an Emergency Use Authorization (EUA). This EUA will remain in effect (meaning this test can be used) for the duration of the COVID-19 declaration under Section 564(b)(1) of the Act, 21 U.S.C. section 360bbb-3(b)(1), unless the authorization is terminated or revoked.     Resp Syncytial Virus by PCR NEGATIVE NEGATIVE Final    Comment: (NOTE) Fact Sheet for Patients: bloggercourse.com  Fact Sheet for Healthcare Providers: seriousbroker.it  This test is not yet approved or cleared by the [place]  FDA and has been authorized for detection and/or diagnosis of SARS-CoV-2 by FDA under an Emergency Use Authorization (EUA). This EUA will remain in effect (meaning this test can be used) for the duration of the COVID-19 declaration under Section 564(b)(1) of the Act, 21 U.S.C. section 360bbb-3(b)(1), unless the authorization is  terminated or revoked.  Performed at [facility], [address] [place], [place] [address]   Blood Culture (routine x 2)     Status: None (Preliminary result)   Collection Time: 09/17/24  8:44 PM   Specimen: BLOOD  Result Value Ref Range Status   Specimen Description BLOOD BLOOD RIGHT HAND  Final   Special Requests   Final    BOTTLES DRAWN AEROBIC AND ANAEROBIC Blood Culture adequate volume   Culture   Final    NO GROWTH 2 DAYS Performed at [facility], [address] [place], [place] [address]    Report Status PENDING  Incomplete     Radiology Studies: CT Angio Chest PE W and/or Wo Contrast Result Date: 09/17/2024 EXAM: CTA CHEST 09/17/2024 10:07:39 PM TECHNIQUE: CTA of the chest was performed after the administration of intravenous contrast. Multiplanar reformatted images are provided for review. MIP images are provided for review. Automated exposure control, iterative reconstruction, and/or weight based  adjustment of the mA/kV was utilized to reduce the radiation dose to as low as reasonably achievable. COMPARISON: None available. CLINICAL HISTORY: Lung cancer patient with right upper lobe neoplasm presenting with hypoxemia and respiratory distress. Pulmonary embolism (PE) suspected, high prob. FINDINGS: PULMONARY ARTERIES: Pulmonary arteries are adequately opacified for evaluation. The pulmonary arteries are slightly prominent but no arterial embolus is seen. MEDIASTINUM: There is mild cardiomegaly with a slight right chamber predominance with RV/LV ratio of 1.06. There is no pericardial effusion. There is atherosclerosis in the aorta, great vessels, small amount in the left main and proximal LAD coronary artery. No aortic dissection or aneurysm. The pulmonary veins are nondilated. There is mild chronic enlargement and heterogeneity of the thyroid  gland without a dominant nodule. There is a chronically patulous esophagus without wall thickening. IVC filter in place. Left chest mediport with IJ approach catheter terminating in the SVC. LYMPH NODES: Subcarinal adenopathy is unchanged up to 1.2 cm on axial 58 of series 6, with increased prominence of AP window and left hilar nodes up to 1.2 cm, increased prominence of right hilar nodes up to 1.5 cm on axial 67. LUNGS AND PLEURA: Lungs are emphysematous. There is mild layering fluid in the distal trachea. There is layering fluid in the bilateral main bronchi, on the left extending into and obstructing the main lower lobe and lower lobe segmental bronchi, with consolidation throughout the left lower lobe consistent with a consolidated pneumonia. There is a right upper lobe suprahilar mass encasing central bronchovascular structures with bronchial narrowing. It is difficult to distinguish between the mass and adjacent pulmonary consolidation. In total, the mass and consolidation today measure 5 x 6.6 cm on axial 47 of series 6, not grossly changed from the PET CT but  certainly significantly larger than on 05/24/2024. There is surrounding spiculation and stranding to pleural surfaces and areas of pleural thickening in the right chest superiorly and medially probably due to pleural metastatic disease and hypermetabolic on the PET CT. There is a small layering right pleural effusion with partial loculation in the superior chest. In the right lower lobe, several coarsely nodular opacities are again noted, consistent with patchy airspace disease versus intraorgan metastases. The largest of these is 2.3 x 1.9 cm on series 8 axial 82, unchanged. Elsewhere, there is progressive diffuse interstitial thickening and micronodularity relatively sparing the right middle lobe and first seen on PET CT but worsened since. 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 No  pneumothorax. UPPER ABDOMEN: The gallbladder is dilated and there are tiny layering stones posteriorly without wall thickening. There are stones in the right renal collecting system and renal pelvis, largest is 6 mm just above the UPJ. There is mild right hydronephrosis. The liver is steatotic. There is a 2 cm Bosniak 1 cyst in the right upper pole. SOFT TISSUES AND BONES: There is osteopenia and kyphosis of the thoracic spine. No destructive bone lesions. No acute soft tissue abnormality. IMPRESSION: 1. No evidence of pulmonary embolism. 2. Left lower lobe consolidation with mucus impaction and complete obstruction of the left lower lobe bronchus, consistent with pneumonia. 3. Cardiomegaly with right chamber predominance indicating right heart dysfunction. the pulmonary arteries are slightly prominent. 4. Fluid in the main bronchi, obstructing the left lower lobe main and segmental bronchi with complete consolidation/ atelectasis of the left lower lobe. 5. Right upper lobe suprahilar mass with bronchovascular encasement and bronchial narrowing, overall  size approximately 5 x 6.6 cm, not grossly changed from recent PET CT but increased from 05/24/2024, with associated pleural thickening suspicious for pleural metastatic disease and small partially loculated right pleural effusion . 6. 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 7. Mediastinal and hilar adenopathy with interval increase in AP window and bilateral hilar nodes up to 1.5 cm and stable subcarinal node up to 1.2 cm. 8. Right nephrolithiasis including a 6 mm renal pelvis just above the UPJ with mild right hydronephrosis. 9. Emphysema, with aortic and coronary atherosclerosis. Electronically signed by: Francis Quam MD 09/17/2024 10:70 PM EST RP Workstation: [id number]   DG Chest Port 1 View Result Date: 09/17/2024 EXAM: 1 VIEW(S) XRAY OF THE CHEST 09/17/2024 07:45:00 PM COMPARISON: 09/02/2024 CLINICAL HISTORY: Questionable sepsis - evaluate for abnormality FINDINGS: LINES, TUBES AND DEVICES: Left-sided port-a-cath tip overlies mid SVC. LUNGS AND PLEURA: Alveolar consolidation right upper lung and diffusely on the left. Small left-sided pleural effusion. No pneumothorax. HEART AND MEDIASTINUM: No acute abnormality of the cardiac and mediastinal silhouettes. BONES AND SOFT TISSUES: No acute osseous abnormality. IMPRESSION: 1. Alveolar consolidation in the right upper lung and diffusely on the left. 2. Small left-sided pleural effusion. Electronically signed by: Fonda Field MD 09/17/2024 07:48 PM EST RP Workstation: [id number]    Scheduled Meds:  (feeding supplement) PROSource Plus  30 mL Oral BID BM   acetylcysteine  4 mL Nebulization BID   aspirin  EC  81 mg Oral Daily   atorvastatin   20 mg Oral Daily   busPIRone  20 mg Oral BID   calcium  carbonate  1 tablet Oral Daily   collagenase  1 Application Topical Daily   enoxaparin  (LOVENOX ) injection  40 mg Subcutaneous Q24H   feeding supplement (GLUCERNA SHAKE)  237 mL Oral  BID BM   ferrous sulfate  325 mg Oral BID WC   guaiFENesin   600 mg Oral BID   insulin  aspart  0-20 Units Subcutaneous TID WC   insulin  aspart  0-5 Units Subcutaneous QHS   insulin  aspart  4 Units Subcutaneous TID WC   insulin  glargine-yfgn  18 Units Subcutaneous Daily   ipratropium-albuterol   3 mL Nebulization TID   levETIRAcetam   500 mg Oral BID   levothyroxine   125 mcg Oral Q0600   lidocaine   1 patch Transdermal Q24H   linagliptin   5 mg Oral Daily   loratadine   10 mg Oral Daily   losartan  25 mg Oral Daily   meclizine   12.5 mg Oral BID   methylPREDNISolone  (SOLU-MEDROL ) injection  60  mg Intravenous Q24H   metoprolol succinate  50 mg Oral Daily   midodrine  10 mg Oral TID WC   multivitamin with minerals  1 tablet Oral Daily   nutrition supplement (JUVEN)  1 packet Oral BID BM   nystatin  1 Application Topical BID   pantoprazole   40 mg Oral BID AC   polyethylene glycol  17 g Oral Daily   pregabalin   50 mg Oral BID   rOPINIRole   4 mg Oral QHS   senna  1 tablet Oral BID   sertraline   100 mg Oral Daily   sodium chloride  HYPERTONIC  4 mL Nebulization BID   zinc oxide  1 Application Topical BID   Continuous Infusions:  azithromycin  500 mg (09/18/24 2032)   cefTRIAXone  (ROCEPHIN )  IV 2 g (09/18/24 2032)     LOS: 2 days  MDM: Patient is high risk for one or more organ failure.  They necessitate ongoing hospitalization for continued IV therapies and subsequent lab monitoring. Total time spent interpreting labs and vitals, reviewing the medical record, coordinating care amongst consultants and care team members, directly assessing and discussing care with the patient and/or family: 55 min Laree Lock, MD [facility]  To contact the attending physician between 7A-7P please use Epic Chat. To contact the covering physician during after hours 7P-7A, please review Amion.  09/19/2024, 11:42 AM   *This document has been created with the assistance of dictation software. Please  excuse typographical errors. *

## 2024-09-19 NOTE — Evaluation (Addendum)
 Physical Therapy Evaluation and Discharge  Patient Details Name: Michele House MRN: [id number] DOB: 11-02-1956 Today's Date: 09/19/2024  History of Present Illness  Patient is a 68 year old female with sepsis due to pneumonia, complete atelectasis of left lower lobe. PMH:  diabetes, morbid obesity, CVA advanced systolic CHF and COPD, chronic hypoxemia, dyslipidemia, thyroid  dysfunction, non-small cell lung cancer   Clinical Impression  The patient is agreeable to PT evaluation. She is a long term resident at [facility]. She is essentially bed bound at baseline and requires hoyer lift transfers to sit up. She does not get out of bed routinely. When she does get to the wheelchair, she cannot sit longer than a few minutes per her report. She needs assistance for ADLs as well.  Today the patient required extensive assistance to sit up on edge of bed. Sitting balance required initially +2 person assistance, with brief periods of one person assistance. She has right side weakness, but is also generally weak throughout. Sp02 remained at 94% on 4 L02 while sitting with no significant dyspnea noted. Give limited pre-morbid functional mobility and no obvious change in functional status from baseline, no acute PT needs are identified at this time. Anticipate patient can return to previous environment. Will sign off at this time.       If plan is discharge home, recommend the following: Two people to help with walking and/or transfers;Two people to help with bathing/dressing/bathroom;Assistance with cooking/housework;Assistance with feeding;Direct supervision/assist for medications management;Direct supervision/assist for financial management;Assist for transportation;Help with stairs or ramp for entrance;Supervision due to cognitive status   Can travel by private vehicle        Equipment Recommendations None recommended by PT  Recommendations for Other Services       Functional Status Assessment Patient  has not had a recent decline in their functional status     Precautions / Restrictions Precautions Precautions: Fall Recall of Precautions/Restrictions: Impaired Restrictions Weight Bearing Restrictions Per Provider Order: No      Mobility  Bed Mobility Overal bed mobility: Needs Assistance Bed Mobility: Supine to Sit, Sit to Supine     Supine to sit: Max assist, +2 for physical assistance Sit to supine: Max assist, +2 for physical assistance   General bed mobility comments: significant assistance required to sit up on edge of bed. cues for technique, initiation. assistance for BLE and trunk support    Transfers                   General transfer comment: not attempted. poor sitting balance. patient required hoyer lift transfer at baseline.    Ambulation/Gait                  Stairs            Wheelchair Mobility     Tilt Bed    Modified Rankin (Stroke Patients Only)       Balance Overall balance assessment: Needs assistance Sitting-balance support: Feet supported Sitting balance-Leahy Scale: Zero Sitting balance - Comments: Max A mostly, up to +2 person assistance. with cues to use LUE support on bed rail, patient was briefly able to maintain sitting balance with Min A. right side and posterior lean Postural control: Right lateral lean, Posterior lean                                   Pertinent Vitals/Pain Pain Assessment Pain Assessment: No/denies  pain    Home Living Family/patient expects to be discharged to:: Skilled nursing facility                   Additional Comments: LTC at [facility], not currently getting therapy per her report    Prior Function Prior Level of Function : Needs assist             Mobility Comments: has bee non ambulatory for at least a year. she requires a hoyer lift to get out of bed and does not get up daily. assistance required for wheelchair mobility. ADLs Comments: assistance  required for ADLs. she reports recent need for feeding assistance     Extremity/Trunk Assessment   Upper Extremity Assessment Upper Extremity Assessment: Defer to OT evaluation (right side appears weaker than left side (from prior CVA per patient report))    Lower Extremity Assessment Lower Extremity Assessment: RLE deficits/detail;LLE deficits/detail RLE Deficits / Details: weakness compared to left side. unable to SLR. trace movement noted at the hip/knee/ankle. patient reports residual wekaness from prior stroke LLE Deficits / Details: patient able to SLR independently. generally weak throughout       Communication   Communication Communication: No apparent difficulties    Cognition Arousal: Alert Behavior During Therapy: WFL for tasks assessed/performed   PT - Cognitive impairments: No family/caregiver present to determine baseline, Safety/Judgement                       PT - Cognition Comments: patient overestimates her abilities (stating she wants to walk altough she has been non ambuatory for a year) Following commands: Impaired Following commands impaired: Only follows one step commands consistently     Cueing Cueing Techniques: Verbal cues, Tactile cues, Visual cues     General Comments General comments (skin integrity, edema, etc.): encourage patient to use bed position of chair and to sit upright as much as possible for meals and for pulmonary hygiene. Sp02 94% on 4 L02 while sitting with no significant shortness of breath with activity    Exercises     Assessment/Plan    PT Assessment All further PT needs can be met in the next venue of care  PT Problem List         PT Treatment Interventions      PT Goals (Current goals can be found in the Care Plan section)  Acute Rehab PT Goals PT Goal Formulation: All assessment and education complete, DC therapy    Frequency       Co-evaluation               AM-PAC PT 6 Clicks Mobility   Outcome Measure Help needed turning from your back to your side while in a flat bed without using bedrails?: Total Help needed moving from lying on your back to sitting on the side of a flat bed without using bedrails?: Total Help needed moving to and from a bed to a chair (including a wheelchair)?: Total Help needed standing up from a chair using your arms (e.g., wheelchair or bedside chair)?: Total Help needed to walk in hospital room?: Total Help needed climbing 3-5 steps with a railing? : Total 6 Click Score: 6    End of Session   Activity Tolerance: Patient limited by fatigue Patient left: in bed;with call bell/phone within reach;with bed alarm set        Time: 9153-9095 PT Time Calculation (min) (ACUTE ONLY): 18 min   Charges:  PT Evaluation $PT Eval Moderate Complexity: 1 Mod   PT General Charges $$ ACUTE PT VISIT: 1 Visit         Randine Essex, PT, MPT   Randine LULLA Essex 09/19/2024, 10:52 AM

## 2024-09-19 NOTE — Plan of Care (Signed)

## 2024-09-19 NOTE — Progress Notes (Signed)
 PULMONOLOGY         Date: 09/19/2024,   MRN# [id number] Michele House [birth year]     AdmissionWeight: 70.2 kg                 CurrentWeight: 70.2 kg  Referring provider: Acute on chr   CHIEF COMPLAINT:   COPD with complete atelectasis of left lower lobe  HISTORY OF PRESENT ILLNESS   This is a pleasant 68 year old female with history of diabetes morbid obesity CVA advanced systolic CHF and COPD with chronic hypoxemia essential hypertension dyslipidemia and thyroid  dysfunction.  Diagnosis of non-small cell lung cancer of the right upper lobe and February 2025.  Patient reports being in her usual state of health [facility] with worsening dyspnea and hypoxemia she required 8 L/min supplemental oxygen in the field by EMS.  Reports fatigue and progressive hypoxemia.  She had chest imaging performed in the emergency department showing previously noted right upper lobe lung mass as well as left lung atelectasis.  She was admitted to hospitalist service with empiric therapy for community-acquired pneumonia receiving IV Rocephin  and IV Zithromax  as well as 2.5 L of IV normal saline resuscitation.  During my interview she reports having normal bowel pattern without diarrhea, able to eat with assistance.  Denies have any fevers or sick contacts.  PCCM consultation placed for additional management evaluation of complete atelectasis of left lower lobe.   09/19/24- patient seen at bedside. She received hypertonic saline and mucomyst overnight with minimal expectoration of phlegm.  She has a weak cough and overall quite deconditioned due to active lung cancer of right upper lobe.  Her CBC is stable without leukocytosis.  Procalcitonin is mildly elevated.  Viral workup is negative thus far.  Blood cultures are too young to read.  Will continue empiric Therapy and bronchopulmonary hygiene with Mucomyst and hypertonic saline and perform bronchoscopy within the next few  days.  PAST MEDICAL HISTORY   Past Medical History:  Diagnosis Date   Acid reflux    COPD (chronic obstructive pulmonary disease) (HCC)    Diabetes mellitus without complication (HCC)    Hyperlipidemia    Hypertension    Stroke [facility])    Thyroid  disease      SURGICAL HISTORY   Past Surgical History:  Procedure Laterality Date   appendectomy     APPENDECTOMY     BREAST BIOPSY Right    CORE W/CLIP - NEG   ESOPHAGOGASTRODUODENOSCOPY N/A 10/28/2023   Procedure: ESOPHAGOGASTRODUODENOSCOPY (EGD);  Surgeon: Jinny Carmine, MD;  Location: [facility] ENDOSCOPY;  Service: Endoscopy;  Laterality: N/A;   FLEXIBLE BRONCHOSCOPY Bilateral 11/12/2023   Procedure: FLEXIBLE BRONCHOSCOPY;  Surgeon: Parris Manna, MD;  Location: [facility] ORS;  Service: Thoracic;  Laterality: Bilateral;   IR EMBO ART  VEN HEMORR LYMPH EXTRAV  INC GUIDE ROADMAPPING  10/30/2023   IR IMAGING GUIDED PORT INSERTION  01/18/2024   IVC FILTER INSERTION N/A 11/09/2023   Procedure: IVC FILTER INSERTION;  Surgeon: Marea Selinda RAMAN, MD;  Location: [facility] INVASIVE CV LAB;  Service: Cardiovascular;  Laterality: N/A;   TOTAL VAGINAL HYSTERECTOMY     TUMOR REMOVAL     benign;stomach   VIDEO BRONCHOSCOPY WITH ENDOBRONCHIAL NAVIGATION Right 10/20/2023   Procedure: VIDEO BRONCHOSCOPY WITH ENDOBRONCHIAL NAVIGATION;  Surgeon: Parris Manna, MD;  Location: [facility] ORS;  Service: Thoracic;  Laterality: Right;   VIDEO BRONCHOSCOPY WITH ENDOBRONCHIAL NAVIGATION Right 11/12/2023   Procedure: VIDEO BRONCHOSCOPY WITH ENDOBRONCHIAL NAVIGATION;  Surgeon: Parris Manna, MD;  Location: [facility] ORS;  Service: Thoracic;  Laterality: Right;     FAMILY HISTORY   Family History  Problem Relation Age of Onset   Heart attack Mother    Hypertension Mother    Breast cancer Sister 39   Breast cancer Maternal Aunt        40'S   Breast cancer Maternal Grandmother      SOCIAL HISTORY   Social History   Tobacco Use   Smoking status: Former    Current  packs/day: 0.00    Types: Cigarettes    Quit date: 12/15/1985    Years since quitting: 38.7   Smokeless tobacco: Never  Substance Use Topics   Alcohol use: No   Drug use: No     MEDICATIONS    Home Medication:     Current Medication:  Current Facility-Administered Medications:    (feeding supplement) PROSource Plus liquid 30 mL, 30 mL, Oral, BID BM, Mansy, Jan A, MD, 30 mL at 09/18/24 1403   acetaminophen  (TYLENOL ) tablet 650 mg, 650 mg, Oral, Q6H PRN **OR** acetaminophen  (TYLENOL ) suppository 650 mg, 650 mg, Rectal, Q6H PRN, Mansy, Jan A, MD   acetylcysteine (MUCOMYST) 20 % nebulizer / oral solution 4 mL, 4 mL, Nebulization, BID, Shay Jhaveri, MD, 4 mL at 09/19/24 0710   alum & mag hydroxide-simeth (MAALOX/MYLANTA) 200-200-20 MG/5ML suspension 20 mL, 20 mL, Oral, Q6H PRN, Mansy, Jan A, MD   aspirin  EC tablet 81 mg, 81 mg, Oral, Daily, Mansy, Jan A, MD, 81 mg at 09/18/24 1031   atorvastatin  (LIPITOR) tablet 20 mg, 20 mg, Oral, Daily, Mansy, Jan A, MD, 20 mg at 09/18/24 1041   azithromycin  (ZITHROMAX ) 500 mg in sodium chloride  0.9 % 250 mL IVPB, 500 mg, Intravenous, Q24H, Mansy, Jan A, MD, Last Rate: 250 mL/hr at 09/18/24 2032, 500 mg at 09/18/24 2032   busPIRone (BUSPAR) tablet 20 mg, 20 mg, Oral, BID, Mansy, Jan A, MD, 20 mg at 09/18/24 2132   calcium  carbonate (TUMS - dosed in mg elemental calcium ) chewable tablet 200 mg of elemental calcium , 1 tablet, Oral, Daily, Mansy, Jan A, MD, 200 mg of elemental calcium  at 09/18/24 1024   cefTRIAXone  (ROCEPHIN ) 2 g in sodium chloride  0.9 % 100 mL IVPB, 2 g, Intravenous, Q24H, Mansy, Jan A, MD, Last Rate: 200 mL/hr at 09/18/24 2032, 2 g at 09/18/24 2032   chlorpheniramine-HYDROcodone  (TUSSIONEX) 10-8 MG/5ML suspension 5 mL, 5 mL, Oral, Q12H PRN, Mansy, Jan A, MD   collagenase (SANTYL) ointment 1 Application, 1 Application, Topical, Daily, Mansy, Jan A, MD   enoxaparin  (LOVENOX ) injection 40 mg, 40 mg, Subcutaneous, Q24H, Mansy, Jan A, MD, 40  mg at 09/18/24 1043   feeding supplement (GLUCERNA SHAKE) (GLUCERNA SHAKE) liquid 237 mL, 237 mL, Oral, BID BM, Mansy, Jan A, MD, 237 mL at 09/18/24 1402   ferrous sulfate tablet 325 mg, 325 mg, Oral, BID WC, Mansy, Jan A, MD, 325 mg at 09/18/24 1732   guaiFENesin  (MUCINEX ) 12 hr tablet 600 mg, 600 mg, Oral, BID, Mansy, Jan A, MD, 600 mg at 09/18/24 2132   insulin  aspart (novoLOG ) injection 0-20 Units, 0-20 Units, Subcutaneous, TID WC, Mansy, Jan A, MD, 4 Units at 09/18/24 1735   insulin  aspart (novoLOG ) injection 0-5 Units, 0-5 Units, Subcutaneous, QHS, Mansy, Jan A, MD, 2 Units at 09/18/24 2133   insulin  aspart (novoLOG ) injection 4 Units, 4 Units, Subcutaneous, TID WC, Mansy, Madison LABOR, MD, 4 Units at 09/18/24 1733   insulin  glargine-yfgn (SEMGLEE ) injection 18 Units, 18  Units, Subcutaneous, Daily, Mansy, Jan A, MD, 18 Units at 09/18/24 1055   ipratropium-albuterol  (DUONEB) 0.5-2.5 (3) MG/3ML nebulizer solution 3 mL, 3 mL, Nebulization, QID, Mansy, Jan A, MD, 3 mL at 09/19/24 9288   ipratropium-albuterol  (DUONEB) 0.5-2.5 (3) MG/3ML nebulizer solution 3 mL, 3 mL, Nebulization, Q4H PRN, Dail Rankin RAMAN, RPH, 3 mL at 09/18/24 0041   levETIRAcetam  (KEPPRA ) tablet 500 mg, 500 mg, Oral, BID, Mansy, Jan A, MD, 500 mg at 09/18/24 2132   levothyroxine  (SYNTHROID ) tablet 125 mcg, 125 mcg, Oral, Q0600, Mansy, Jan A, MD, 125 mcg at 09/19/24 9473   lidocaine  (LIDODERM ) 5 % 1 patch, 1 patch, Transdermal, Q24H, Mansy, Jan A, MD   linagliptin  (TRADJENTA ) tablet 5 mg, 5 mg, Oral, Daily, Mansy, Jan A, MD, 5 mg at 09/18/24 1032   loratadine  (CLARITIN ) tablet 10 mg, 10 mg, Oral, Daily, Mansy, Jan A, MD, 10 mg at 09/18/24 1034   losartan (COZAAR) tablet 25 mg, 25 mg, Oral, Daily, Mansy, Jan A, MD, 25 mg at 09/18/24 1035   magnesium  hydroxide (MILK OF MAGNESIA) suspension 30 mL, 30 mL, Oral, Daily PRN, Mansy, Jan A, MD   meclizine  (ANTIVERT ) tablet 12.5 mg, 12.5 mg, Oral, BID, Mansy, Jan A, MD, 12.5 mg at 09/18/24 2132    methylPREDNISolone  sodium succinate (SOLU-MEDROL ) 125 mg/2 mL injection 60 mg, 60 mg, Intravenous, Q24H, Jequan Shahin, MD   metoprolol succinate (TOPROL-XL) 24 hr tablet 50 mg, 50 mg, Oral, Daily, Mansy, Jan A, MD, 50 mg at 09/18/24 1030   midodrine (PROAMATINE) tablet 10 mg, 10 mg, Oral, TID WC, Mansy, Jan A, MD, 10 mg at 09/18/24 1733   multivitamin with minerals tablet 1 tablet, 1 tablet, Oral, Daily, Mansy, Jan A, MD, 1 tablet at 09/18/24 1033   nutrition supplement (JUVEN) (JUVEN) powder packet 1 packet, 1 packet, Oral, BID BM, Mansy, Jan A, MD   nystatin (MYCOSTATIN/NYSTOP) topical powder 1 Application, 1 Application, Topical, BID, Mansy, Jan A, MD, 1 Application at 09/18/24 2134   ondansetron  (ZOFRAN ) tablet 4 mg, 4 mg, Oral, Q6H PRN **OR** ondansetron  (ZOFRAN ) injection 4 mg, 4 mg, Intravenous, Q6H PRN, Mansy, Jan A, MD   pantoprazole  (PROTONIX ) EC tablet 40 mg, 40 mg, Oral, BID AC, Mansy, Jan A, MD, 40 mg at 09/18/24 1733   polyethylene glycol (MIRALAX  / GLYCOLAX ) packet 17 g, 17 g, Oral, Daily, Mansy, Jan A, MD, 17 g at 09/18/24 1042   pregabalin  (LYRICA ) capsule 50 mg, 50 mg, Oral, BID, Mansy, Jan A, MD, 50 mg at 09/18/24 2132   rOPINIRole  (REQUIP  XL) 24 hr tablet 4 mg, 4 mg, Oral, QHS, Mansy, Jan A, MD, 4 mg at 09/18/24 2134   senna (SENOKOT) tablet 8.6 mg, 1 tablet, Oral, BID, Mansy, Jan A, MD, 8.6 mg at 09/18/24 2132   sertraline  (ZOLOFT ) tablet 100 mg, 100 mg, Oral, Daily, Ponnala, Shruthi, MD, 100 mg at 09/18/24 1041   sodium chloride  HYPERTONIC 3 % nebulizer solution 4 mL, 4 mL, Nebulization, BID, Ponnala, Shruthi, MD, 4 mL at 09/19/24 9288   traMADol  (ULTRAM ) tablet 50 mg, 50 mg, Oral, Q6H PRN, Mansy, Jan A, MD, 50 mg at 09/19/24 0426   traZODone (DESYREL) tablet 25 mg, 25 mg, Oral, QHS PRN, Mansy, Jan A, MD   zinc oxide 20 % ointment 1 Application, 1 Application, Topical, BID, Mansy, Madison LABOR, MD, 1 Application at 09/18/24 2134  Facility-Administered Medications Ordered in Other  Encounters:    oxyCODONE  (Oxy IR/ROXICODONE ) immediate release tablet 5 mg, 5 mg, Oral, Once,  Agrawal, Kavita, MD    ALLERGIES   Penicillins, Aspirin , and Sulfa antibiotics     REVIEW OF SYSTEMS    Review of Systems:  Gen:  Denies  fever, sweats, chills weigh loss  HEENT: Denies blurred vision, double vision, ear pain, eye pain, hearing loss, nose bleeds, sore throat Cardiac:  No dizziness, chest pain or heaviness, chest tightness,edema Resp:   reports dyspnea chronically  Gi: Denies swallowing difficulty, stomach pain, nausea or vomiting, diarrhea, constipation, bowel incontinence Gu:  Denies bladder incontinence, burning urine Ext:   Denies Joint pain, stiffness or swelling Skin: Denies  skin rash, easy bruising or bleeding or hives Endoc:  Denies polyuria, polydipsia , polyphagia or weight change Psych:   Denies depression, insomnia or hallucinations   Other:  All other systems negative   VS: BP (!) 115/59 (BP Location: Right Arm)   Pulse 72   Temp 97.8 F (36.6 C)   Resp 20   Ht 5' 1 (1.549 m)   Wt 70.2 kg   LMP 12/24/1998 Comment: prior to her hysterectomy  SpO2 97%   BMI 29.24 kg/m      PHYSICAL EXAM    GENERAL:NAD, no fevers, chills, no weakness no fatigue HEAD: Normocephalic, atraumatic.  EYES: Pupils equal, round, reactive to light. Extraocular muscles intact. No scleral icterus.  MOUTH: Moist mucosal membrane. Dentition intact. No abscess noted.  EAR, NOSE, THROAT: Clear without exudates. No external lesions.  NECK: Supple. No thyromegaly. No nodules. No JVD.  PULMONARY: decreased breath sounds with mild rhonchi worse at bases bilaterally.  CARDIOVASCULAR: S1 and S2. Regular rate and rhythm. No murmurs, rubs, or gallops. No edema. Pedal pulses 2+ bilaterally.  GASTROINTESTINAL: Soft, nontender, nondistended. No masses. Positive bowel sounds. No hepatosplenomegaly.  MUSCULOSKELETAL: No swelling, clubbing, or edema. Range of motion full in all  extremities.  NEUROLOGIC: Cranial nerves II through XII are intact. No gross focal neurological deficits. Sensation intact. Reflexes intact.  SKIN: No ulceration, lesions, rashes, or cyanosis. Skin warm and dry. Turgor intact.  PSYCHIATRIC: Mood, affect within normal limits. The patient is awake, alert and oriented x 3. Insight, judgment intact.       IMAGING     Narrative & Impression  EXAM: CTA CHEST 09/17/2024 10:07:39 PM   TECHNIQUE: CTA of the chest was performed after the administration of intravenous contrast. Multiplanar reformatted images are provided for review. MIP images are provided for review. Automated exposure control, iterative reconstruction, and/or weight based adjustment of the mA/kV was utilized to reduce the radiation dose to as low as reasonably achievable.   COMPARISON: None available.   CLINICAL HISTORY: Lung cancer patient with right upper lobe neoplasm presenting with hypoxemia and respiratory distress. Pulmonary embolism (PE) suspected, high prob.   FINDINGS:   PULMONARY ARTERIES: Pulmonary arteries are adequately opacified for evaluation. The pulmonary arteries are slightly prominent but no arterial embolus is seen.   MEDIASTINUM: There is mild cardiomegaly with a slight right chamber predominance with RV/LV ratio of 1.06. There is no pericardial effusion. There is atherosclerosis in the aorta, great vessels, small amount in the left main and proximal LAD coronary artery. No aortic dissection or aneurysm.   The pulmonary veins are nondilated. There is mild chronic enlargement and heterogeneity of the thyroid  gland without a dominant nodule.   There is a chronically patulous esophagus without wall thickening. IVC filter in place. Left chest mediport with IJ approach catheter terminating in the SVC.   LYMPH NODES: Subcarinal adenopathy is unchanged up to 1.2  cm on axial 58 of series 6, with increased prominence of AP window and left hilar  nodes up to 1.2 cm, increased prominence of right hilar nodes up to 1.5 cm on axial 67.   LUNGS AND PLEURA: Lungs are emphysematous. There is mild layering fluid in the distal trachea. There is layering fluid in the bilateral main bronchi, on the left extending into and obstructing the main lower lobe and lower lobe segmental bronchi, with consolidation throughout the left lower lobe consistent with a consolidated pneumonia.   There is a right upper lobe suprahilar mass encasing central bronchovascular structures with bronchial narrowing.   It is difficult to distinguish between the mass and adjacent pulmonary consolidation. In total, the mass and consolidation today measure 5 x 6.6 cm on axial 47 of series 6, not grossly changed from the PET CT but certainly significantly larger than on 05/24/2024. There is surrounding spiculation and stranding to pleural surfaces and areas of pleural thickening in the right chest superiorly and medially probably due to pleural metastatic disease and hypermetabolic on the PET CT.   There is a small layering right pleural effusion with partial loculation in the superior chest. In the right lower lobe, several coarsely nodular opacities are again noted, consistent with patchy airspace disease versus intraorgan metastases.   The largest of these is 2.3 x 1.9 cm on series 8 axial 82, unchanged.   Elsewhere, there is progressive diffuse interstitial thickening and micronodularity relatively sparing the right middle lobe and first seen on PET CT but worsened since.   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 No pneumothorax.   UPPER ABDOMEN: The gallbladder is dilated and there are tiny layering stones posteriorly without wall thickening.   There are stones in the right renal collecting system and renal pelvis, largest is 6 mm just above the UPJ. There is mild right  hydronephrosis.   The liver is steatotic. There is a 2 cm Bosniak 1 cyst in the right upper pole.   SOFT TISSUES AND BONES: There is osteopenia and kyphosis of the thoracic spine. No destructive bone lesions. No acute soft tissue abnormality.   IMPRESSION: 1. No evidence of pulmonary embolism. 2. Left lower lobe consolidation with mucus impaction and complete obstruction of the left lower lobe bronchus, consistent with pneumonia. 3. Cardiomegaly with right chamber predominance indicating right heart dysfunction. the pulmonary arteries are slightly prominent. 4. Fluid in the main bronchi, obstructing the left lower lobe main and segmental bronchi with complete consolidation/ atelectasis of the left lower lobe. 5. Right upper lobe suprahilar mass with bronchovascular encasement and bronchial narrowing, overall size approximately 5 x 6.6 cm, not grossly changed from recent PET CT but increased from 05/24/2024, with associated pleural thickening suspicious for pleural metastatic disease and small partially loculated right pleural effusion . 6. 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 7. Mediastinal and hilar adenopathy with interval increase in AP window and bilateral hilar nodes up to 1.5 cm and stable subcarinal node up to 1.2 cm. 8. Right nephrolithiasis including a 6 mm renal pelvis just above the UPJ with mild right hydronephrosis. 9. Emphysema, with aortic and coronary atherosclerosis.   Electronically signed by: Francis Quam MD 09/17/2024 10:53 PM EST RP Workstation: [id number]      Result History    ASSESSMENT/PLAN   Complete atelectasis of left lower lobe Patient with known advanced COPD and non-small cell lung cancer , Initially will attempt  noninvasive modalities with Mucomyst nebulized solution 20% 4 mL twice daily - Incentive spirometry and flutter valve to be used by patient multiple times  each hour  -Discussed care plan with RT today- -patient receiving hypertonic saline nebulized for bronchopulmonary hygiene -patient has received 2 treatments of Mucomyst over the last 24 hours with minimal improvement we will consider bronchoscopy during this hospitalization   Community-acquired pneumonia of left lung Agree with IV Rocephin  and Zithromax  with a goal of 7 days total therapy     Advanced COPD with chronic hypoxemia Continue nebulizer therapy as needed -Solu-Medrol  is been reduced from 80 mg to 60 IV daily   Morbid obesity and physical deconditioning - Contributing to atelectasis, recommendation for PT OT as soon as able    Thank you for allowing me to participate in the care of this patient.   Patient/Family are satisfied with care plan and all questions have been answered.    Provider disclosure: Patient with at least one acute or chronic illness or injury that poses a threat to life or bodily function and is being managed actively during this encounter.  All of the below services have been performed independently by signing provider:  review of prior documentation from internal and or external health records.  Review of previous and current lab results.  Interview and comprehensive assessment during patient visit today. Review of current and previous chest radiographs/CT scans. Discussion of management and test interpretation with health care team and patient/family.   This document was prepared using Dragon voice recognition software and may include unintentional dictation errors.     Zamarion Longest, M.D.  Division of Pulmonary & Critical Care Medicine

## 2024-09-19 NOTE — Care Management Important Message (Signed)
 Important Message  Patient Details  Name: Michele House MRN: [id number] Date of Birth: 1956/06/04   Important Message Given:  Yes - Medicare IM     [name] 09/19/2024, 4:41 PM

## 2024-09-20 DIAGNOSIS — J189 Pneumonia, unspecified organism: Secondary | ICD-10-CM | POA: Diagnosis not present

## 2024-09-20 DIAGNOSIS — A419 Sepsis, unspecified organism: Secondary | ICD-10-CM | POA: Diagnosis not present

## 2024-09-20 DIAGNOSIS — Z7189 Other specified counseling: Secondary | ICD-10-CM

## 2024-09-20 DIAGNOSIS — J9621 Acute and chronic respiratory failure with hypoxia: Secondary | ICD-10-CM | POA: Diagnosis not present

## 2024-09-20 DIAGNOSIS — J441 Chronic obstructive pulmonary disease with (acute) exacerbation: Secondary | ICD-10-CM | POA: Diagnosis not present

## 2024-09-20 LAB — GLUCOSE, CAPILLARY
Glucose-Capillary: 135 mg/dL — ABNORMAL HIGH (ref 70–99)
Glucose-Capillary: 240 mg/dL — ABNORMAL HIGH (ref 70–99)
Glucose-Capillary: 324 mg/dL — ABNORMAL HIGH (ref 70–99)
Glucose-Capillary: 204 mg/dL — ABNORMAL HIGH (ref 70–99)

## 2024-09-20 LAB — BASIC METABOLIC PANEL WITH GFR
Anion gap: 6 (ref 5–15)
BUN: 27 mg/dL — ABNORMAL HIGH (ref 8–23)
CO2: 40 mmol/L — ABNORMAL HIGH (ref 22–32)
Calcium: 11.4 mg/dL — ABNORMAL HIGH (ref 8.9–10.3)
Chloride: 99 mmol/L (ref 98–111)
Creatinine, Ser: 0.7 mg/dL (ref 0.44–1.00)
GFR, Estimated: >60 mL/min (ref >60)
Glucose, Bld: 166 mg/dL — ABNORMAL HIGH (ref 70–99)
Potassium: 4.2 mmol/L (ref 3.5–5.1)
Sodium: 144 mmol/L (ref 135–145)

## 2024-09-20 LAB — CBC
HCT: 30.1 % — ABNORMAL LOW (ref 36.0–46.0)
Hemoglobin: 9.1 g/dL — ABNORMAL LOW (ref 12.0–15.0)
MCH: 25.4 pg — ABNORMAL LOW (ref 26.0–34.0)
MCHC: 30.2 g/dL (ref 30.0–36.0)
MCV: 84.1 fL (ref 80.0–100.0)
Platelets: 158 K/uL (ref 150–400)
RBC: 3.58 MIL/uL — ABNORMAL LOW (ref 3.87–5.11)
RDW: 16.8 % — ABNORMAL HIGH (ref 11.5–15.5)
WBC: 3.6 K/uL — ABNORMAL LOW (ref 4.0–10.5)
nRBC: 0 % (ref 0.0–0.2)

## 2024-09-20 LAB — LEGIONELLA PNEUMOPHILA SEROGP 1 UR AG: L. pneumophila Serogp 1 Ur Ag: NEGATIVE

## 2024-09-20 MED ORDER — CEFUROXIME AXETIL 500 MG PO TABS
500.0000 mg | ORAL_TABLET | Freq: Two times a day (BID) | ORAL | Status: DC
  Administered 2024-09-20 – 2024-09-21 (×3): 500 mg via ORAL
  Filled 2024-09-20 (×5): qty 1

## 2024-09-20 MED ORDER — AZITHROMYCIN 500 MG PO TABS
500.0000 mg | ORAL_TABLET | Freq: Every day | ORAL | Status: AC
  Administered 2024-09-20 – 2024-09-21 (×2): 500 mg via ORAL
  Filled 2024-09-20 (×2): qty 1

## 2024-09-20 NOTE — Progress Notes (Signed)
 Mobility Specialist Progress Note:    09/20/24 1146  Mobility  Activity Dangled on edge of bed;Turned to right side;Turned to left side;Turned to back - supine  Level of Assistance Maximum assist, patient does 25-49%  Assistive Device None  Activity Response Tolerated well  Mobility visit 1 Mobility  Mobility Specialist Start Time (ACUTE ONLY) 1115  Mobility Specialist Stop Time (ACUTE ONLY) 1150  Mobility Specialist Time Calculation (min) (ACUTE ONLY) 35 min   Pt received in bed, agreeable to mobility. Required MaxA +2 for bed mobility. Upon sitting EOB pt able to perform leg marches and extensions x3 each leg. Pt required MaxA for trunk support and seated balance. Returned pt fowlers position, alarm on and belongings in reach. All linens changed and necessary peri care performed during session. All needs met.  Sherrilee Ditty Mobility Specialist Please contact via Special Educational Needs Teacher or  Rehab office at [phone]

## 2024-09-20 NOTE — Plan of Care (Signed)
   Problem: Skin Integrity: Goal: Risk for impaired skin integrity will decrease Outcome: Progressing

## 2024-09-20 NOTE — Consult Note (Addendum)
 Consultation Note Date: 09/20/2024   Patient Name: Michele House  DOB: 18-Jan-1956  MRN: [id number]  Age / Sex: 68 y.o., female  PCP: [facility] Referring Physician: Maree Hue, MD  Reason for Consultation: Establishing goals of care  HPI/Patient Profile: Michele House is a 68 y.o. 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  On presentation also had worsening dyspnea, hypoxia.  Patient admitted for sepsis due to pneumonia.   Clinical Assessment and Goals of Care: Notes labs reviewed.  Patient is for her to PMT from previous admission.  In to see patient.  She is currently resting in bed at this time, no family at bedside.  She discusses that she lives with her son at baseline.  She discusses previous admission, and ongoing follow-up with oncology Dr. 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   She discusses plans for bronchoscopy is 7:30 AM on Friday.  Initiated discussion on goals of care.  She quickly states she would never want to be put on the ventilator, which is also documented as her feelings last admission.  Upon discussing CPR, she states she would want CPR.  Attempted to discuss how breathing (ventilation and respiration) and heartbeat both are necessary to live, and she became confused with this.  Discussed that we could speak with her children to help with decision making.    SUMMARY OF RECOMMENDATIONS   PMT will follow.  Will need children to assist with goals of care      Primary Diagnoses: Present on Admission:  Sepsis due  to pneumonia [facility])   I have reviewed the medical record, interviewed the patient and family, and examined the patient. The following aspects are pertinent.  Past Medical History:  Diagnosis Date   Acid reflux    COPD (chronic obstructive pulmonary disease) (HCC)    Diabetes mellitus without complication (HCC)    Hyperlipidemia    Hypertension    Stroke (HCC)    Thyroid  disease    Social History   Socioeconomic History   Marital status: Single    Spouse name: Not on file   Number of children: Not on file   Years of education: Not on file   Highest education level: Not on file  Occupational History   Not on file  Tobacco Use   Smoking status: Former    Current packs/day: 0.00    Types: Cigarettes    Quit date: 12/15/1985    Years since quitting: 38.7   Smokeless tobacco: Never  Substance and Sexual Activity   Alcohol use: No   Drug use: No   Sexual activity: Not Currently  Other Topics Concern   Not on file  Social History Narrative  Not on file   Social Drivers of Health   Financial Resource Strain: Not on file  Food Insecurity: Patient Unable To Answer (09/18/2024)   Hunger Vital Sign    Worried About Running Out of Food in the Last Year: Patient unable to answer    Ran Out of Food in the Last Year: Patient unable to answer  Transportation Needs: Patient Unable To Answer (09/18/2024)   PRAPARE - Transportation    Lack of Transportation (Medical): Patient unable to answer    Lack of Transportation (Non-Medical): Patient unable to answer  Physical Activity: Not on file  Stress: Not on file  Social Connections: Patient Unable To Answer (09/18/2024)   Social Connection and Isolation Panel    Frequency of Communication with Friends and Family: Patient unable to answer    Frequency of Social Gatherings with Friends and Family: Patient unable to answer    Attends Religious Services: Patient unable to answer    Active Member of Clubs or Organizations: Patient unable  to answer    Attends Banker Meetings: Patient unable to answer    Marital Status: Patient unable to answer  Recent Concern: Social Connections - Socially Isolated (09/01/2024)   Social Connection and Isolation Panel    Frequency of Communication with Friends and Family: Never    Frequency of Social Gatherings with Friends and Family: Never    Attends Religious Services: Never    Database Administrator or Organizations: No    Attends Banker Meetings: Never    Marital Status: Widowed   Family History  Problem Relation Age of Onset   Heart attack Mother    Hypertension Mother    Breast cancer Sister 96   Breast cancer Maternal Aunt        40'S   Breast cancer Maternal Grandmother    Scheduled Meds:  (feeding supplement) PROSource Plus  30 mL Oral BID BM   acetylcysteine  4 mL Nebulization BID   aspirin  EC  81 mg Oral Daily   atorvastatin   20 mg Oral Daily   azithromycin   500 mg Oral QHS   busPIRone  20 mg Oral BID   calcium  carbonate  1 tablet Oral Daily   cefUROXime  500 mg Oral BID WC   collagenase  1 Application Topical Daily   enoxaparin  (LOVENOX ) injection  40 mg Subcutaneous Q24H   feeding supplement (GLUCERNA SHAKE)  237 mL Oral BID BM   ferrous sulfate  325 mg Oral BID WC   furosemide   20 mg Oral Daily   insulin  aspart  0-20 Units Subcutaneous TID WC   insulin  aspart  0-5 Units Subcutaneous QHS   insulin  aspart  4 Units Subcutaneous TID WC   insulin  glargine-yfgn  18 Units Subcutaneous Daily   ipratropium-albuterol   3 mL Nebulization TID   levETIRAcetam   500 mg Oral BID   levothyroxine   125 mcg Oral Q0600   lidocaine   1 patch Transdermal Q24H   linagliptin   5 mg Oral Daily   methylPREDNISolone  (SOLU-MEDROL ) injection  60 mg Intravenous Q24H   metoprolol succinate  50 mg Oral Daily   multivitamin with minerals  1 tablet Oral Daily   nutrition supplement (JUVEN)  1 packet Oral BID BM   nystatin  1 Application Topical BID   pantoprazole    40 mg Oral BID AC   polyethylene glycol  17 g Oral Daily   pregabalin   50 mg Oral BID   rOPINIRole   4 mg Oral QHS  senna  1 tablet Oral BID   sertraline   100 mg Oral Daily   sodium chloride  HYPERTONIC  4 mL Nebulization BID   spironolactone  25 mg Oral Daily   zinc oxide  1 Application Topical BID   Continuous Infusions: PRN Meds:.acetaminophen  **OR** acetaminophen , alum & mag hydroxide-simeth, chlorpheniramine-HYDROcodone , ipratropium-albuterol , magnesium  hydroxide, midodrine, ondansetron  **OR** ondansetron  (ZOFRAN ) IV, traMADol , traZODone Medications Prior to Admission:  Prior to Admission medications   Medication Sig Start Date End Date Taking? Authorizing Provider  acetaminophen  (TYLENOL ) 325 MG tablet Take 650 mg by mouth every 6 (six) hours as needed.   Yes [provider]  alum & mag hydroxide-simeth (MAALOX PLUS) 400-400-40 MG/5ML suspension Take 10 mLs by mouth every 6 (six) hours as needed for indigestion.   Yes [provider]  aspirin  EC 81 MG tablet Take 81 mg by mouth daily.   Yes [provider]  atorvastatin  (LIPITOR) 20 MG tablet Take 20 mg by mouth daily. 06/25/23  Yes [provider]  busPIRone (BUSPAR) 10 MG tablet Take 20 mg by mouth 2 (two) times daily.   Yes [provider]  calcium  carbonate (TUMS - DOSED IN MG ELEMENTAL CALCIUM ) 500 MG chewable tablet Chew 1 tablet by mouth daily.   Yes [provider]  cetirizine (ZYRTEC) 10 MG tablet Take 10 mg by mouth daily.   Yes [provider]  DULERA 200-5 MCG/ACT AERO Inhale 2 puffs into the lungs every 12 (twelve) hours. 09/06/23  Yes [provider]  ferrous sulfate 325 (65 FE) MG tablet Take 1 tablet (325 mg total) by mouth 2 (two) times daily with a meal. 09/04/24  Yes Tobie Calix, MD  furosemide  (LASIX ) 20 MG tablet Take 1 tablet (20 mg total) by mouth daily. 09/27/23  Yes Shona Terry SAILOR, DO  glipiZIDE  (GLUCOTROL ) 5 MG tablet Take 5 mg by mouth daily  before breakfast.   Yes [provider]  insulin  aspart (NOVOLOG ) 100 UNIT/ML injection Inject 4 Units into the skin 3 (three) times daily with meals. 12/02/23  Yes Awanda City, MD  insulin  glargine-yfgn (SEMGLEE ) 100 UNIT/ML injection Inject 0.18 mLs (18 Units total) into the skin daily. 12/03/23  Yes Awanda City, MD  ipratropium (ATROVENT HFA) 17 MCG/ACT inhaler Inhale 2 puffs into the lungs every 6 (six) hours as needed.   Yes [provider]  ipratropium-albuterol  (DUONEB) 0.5-2.5 (3) MG/3ML SOLN Take 3 mLs by nebulization 2 (two) times daily.   Yes [provider]  levETIRAcetam  (KEPPRA ) 500 MG tablet Take 1 tablet (500 mg total) by mouth 2 (two) times daily. 12/02/23  Yes Awanda City, MD  levothyroxine  (SYNTHROID ) 125 MCG tablet Take 125 mcg by mouth every morning.   Yes [provider]  lidocaine  (LIDODERM ) 5 % Place 1 patch onto the skin daily. Remove & Discard patch within 12 hours or as directed by MD   Yes [provider]  lidocaine -prilocaine  (EMLA ) cream Apply 1 Application topically once.   Yes [provider]  losartan (COZAAR) 25 MG tablet Take 25 mg by mouth daily.   Yes [provider]  meclizine  (ANTIVERT ) 12.5 MG tablet Take 12.5 mg by mouth 2 (two) times daily.   Yes [provider]  metoprolol succinate (TOPROL-XL) 50 MG 24 hr tablet Take 50 mg by mouth daily. Take with or immediately following a meal.   Yes [provider]  Multiple Vitamin (MULTIVITAMIN) capsule Take 1 capsule by mouth daily.   Yes [provider]  nutrition  supplement, JUVEN, (JUVEN) PACK Take 1 packet by mouth 2 (two) times daily between meals. 09/04/24  Yes Patel, Sona, MD  nystatin (MYCOSTATIN/NYSTOP) powder Apply 1 Application topically 2 (two) times daily.   Yes [provider]  ondansetron  (ZOFRAN ) 4 MG/5ML solution Take 8 mg by mouth every 8 (eight) hours as needed for refractory nausea / vomiting.   Yes [provider]  pantoprazole  (PROTONIX ) 40 MG tablet Take 1 tablet (40 mg total) by mouth 2 (two) times daily before a meal. 12/02/23  Yes Awanda City, MD  polyethylene glycol (MIRALAX  / GLYCOLAX ) 17 g packet Take 17 g by mouth daily. 12/03/23  Yes Awanda City, MD  pregabalin  (LYRICA ) 50 MG capsule Take 50 mg by mouth 2 (two) times daily.   Yes [provider]  rOPINIRole  (REQUIP  XL) 4 MG 24 hr tablet Take 1 tablet (4 mg total) by mouth at bedtime. 12/02/23  Yes Awanda City, MD  senna (SENOKOT) 8.6 MG TABS tablet Take 1 tablet (8.6 mg total) by mouth in the morning and at bedtime. 03/03/24  Yes Agrawal, Kavita, MD  sertraline  (ZOLOFT ) 100 MG tablet Take 100 mg by mouth daily. 06/28/23  Yes [provider]  spironolactone (ALDACTONE) 25 MG tablet Take 25 mg by mouth daily. 09/14/24 09/14/25 Yes [provider]  TRADJENTA  5 MG TABS tablet Take 5 mg by mouth daily. 07/26/23  Yes [provider]  traMADol  (ULTRAM ) 50 MG tablet Take 1 tablet (50 mg total) by mouth every 6 (six) hours as needed for severe pain (pain score 7-10). 09/04/24  Yes Tobie Calix, MD  ZINC OXIDE, TOPICAL, 25 % OINT Apply 1 Application topically 2 (two) times daily.   Yes [provider]  collagenase (SANTYL) 250 UNIT/GM ointment Apply topically daily. 09/04/24   Patel, Sona, MD  feeding supplement, GLUCERNA SHAKE, (GLUCERNA SHAKE) LIQD Take 237 mLs by mouth 2 (two) times daily between meals. 09/04/24   Patel, Sona, MD  naloxone [facility]) nasal spray 4 mg/0.1 mL Place 1 spray into the nose.    [provider]  Nutritional Supplements (,FEEDING SUPPLEMENT, PROSOURCE PLUS) liquid Take 30 mLs by mouth 2 (two) times daily between meals. 09/04/24   Patel, Sona, MD   Allergies  Allergen Reactions   Penicillins Hives   Aspirin  Other (See Comments)    Lips numb   Sulfa Antibiotics    Review of Systems  All other systems reviewed and are negative.   Physical Exam Pulmonary:     Effort:  Pulmonary effort is normal.  Skin:    General: Skin is warm and dry.  Neurological:     Mental Status: She is alert.     Vital Signs: BP 121/61 (BP Location: Right Arm)   Pulse 75   Temp (!) 97.4 F (36.3 C)   Resp 18   Ht 5' 1 (1.549 m)   Wt 70.2 kg   LMP 12/24/1998 Comment: prior to her hysterectomy  SpO2 95%   BMI 29.24 kg/m  Pain Scale: 0-10   Pain Score: 0-No pain   SpO2: SpO2: 95 % O2 Device:SpO2: 95 % O2 Flow Rate: .O2 Flow Rate (L/min): 3 L/min  IO: Intake/output summary:  Intake/Output Summary (Last 24 hours) at 09/20/2024 1605 Last data filed at 09/20/2024 0945 Gross per 24 hour  Intake 600 ml  Output 1000 ml  Net -400 ml    LBM: Last BM Date : 09/14/24 Baseline Weight: Weight: 70.2 kg Most recent weight: Weight: 70.2 kg  Signed by: Camelia Lewis, NP   Please contact Palliative Medicine Team phone at [phone] for questions and concerns.  For individual provider: See Tracey

## 2024-09-20 NOTE — TOC Initial Note (Signed)
 Transition of Care [facility]) - Initial/Assessment Note    Patient Details  Name: Michele House MRN: [id number] Date of Birth: [birth year]  Transition of Care [facility]) CM/SW Contact:    Lauraine JAYSON Carpen, LCSW Phone Number: 09/20/2024, 12:26 PM  Clinical Narrative:   Patient not fully oriented. CSW is familiar with patient from last admission. She is a long-term resident at [facility]. PT recommending some rehab when she returns if possible. CSW spoke to PT supervisor at [facility]. She stated they have tried in the past but due to patient's medical condition, it was difficulty for her to participate. They will evaluate her when she returns and try to get insurance approval. Daughter is aware and agreeable. No further concerns. CSW will continue to follow patient and her daughter for support and facilitate return to SNF once medically stable.               Expected Discharge Plan: Skilled Nursing Facility Barriers to Discharge: Continued Medical Work up   Patient Goals and CMS Choice            Expected Discharge Plan and Services     Post Acute Care Choice: Resumption of Svcs/PTA Provider Living arrangements for the past 2 months: Skilled Nursing Facility                                      Prior Living Arrangements/Services Living arrangements for the past 2 months: Skilled Nursing Facility Lives with:: Facility Resident Patient language and need for interpreter reviewed:: Yes Do you feel safe going back to the place where you live?: Yes      Need for Family Participation in Patient Care: Yes (Comment) Care giver support system in place?: Yes (comment)   Criminal Activity/Legal Involvement Pertinent to Current Situation/Hospitalization: No - Comment as needed  Activities of Daily Living      Permission Sought/Granted Permission sought to share information with : Facility Medical Sales Representative, Family Supports    Share Information with NAME: [name]  Permission granted to share info w AGENCY: [facility]  Permission granted to share info w Relationship: Daughter  Permission granted to share info w Contact Information: [phone]  Emotional Assessment   Attitude/Demeanor/Rapport: Unable to Assess Affect (typically observed): Unable to Assess Orientation: : Oriented to Self, Oriented to Place Alcohol / Substance Use: Not Applicable Psych Involvement: No (comment)  Admission diagnosis:  Primary malignant neoplasm of right lung metastatic to other site [facility]) [C34.91] Sepsis due to pneumonia (HCC) [J18.9, A41.9] Acute on chronic respiratory failure with hypoxia and hypercapnia (HCC) [J96.21, J96.22] Sepsis with acute hypoxic respiratory failure and septic shock, due to unspecified organism (HCC) [A41.9, R65.21, J96.01] Patient Active Problem List   Diagnosis Date Noted   Nephrolithiasis 09/18/2024   Sepsis due to pneumonia (HCC) 09/17/2024   COPD with acute exacerbation (HCC) 09/17/2024   Acute on chronic respiratory failure with hypoxia and hypercapnia (HCC) 09/17/2024   Seizure disorder (HCC) 09/17/2024   Dyslipidemia 09/17/2024   HFrEF (heart failure with reduced ejection fraction) (HCC) 09/17/2024   Type 2 diabetes mellitus with peripheral neuropathy (HCC) 09/17/2024   Acute respiratory failure with hypoxia and hypercarbia (HCC) 09/02/2024   Pressure injury of skin 09/02/2024   Encounter for antineoplastic chemotherapy 02/04/2024   IDA (iron  deficiency anemia) 01/07/2024   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) 12/17/2023   Anemia 12/17/2023  Acute deep vein thrombosis (DVT) of right lower extremity (HCC) 11/09/2023   Bilateral leg pain 11/09/2023   Melena 10/28/2023   Gastrointestinal hemorrhage with melena 10/28/2023   Duodenal ulcer 10/28/2023   CVA (cerebral vascular accident) (HCC) 10/14/2023   Hypotension 10/11/2023   Hypernatremia 10/11/2023   Uncontrolled type 2 diabetes mellitus with  hypoglycemia, without long-term current use of insulin  (HCC) 10/08/2023   Acute metabolic encephalopathy 10/06/2023   Altered mental status 10/02/2023   UTI (urinary tract infection) 10/02/2023   (HFpEF) heart failure with preserved ejection fraction (HCC) 10/02/2023   Pneumonia 09/24/2023   COPD (chronic obstructive pulmonary disease) (HCC) 09/24/2023   Acute CHF (congestive heart failure) (HCC) 09/23/2023   COPD exacerbation (HCC) 06/07/2023   Acute on chronic respiratory failure with hypoxia (HCC) 06/07/2023   Allergic reaction 06/07/2023   Disequilibrium 07/16/2015   Pain in right shoulder 05/28/2015   Dysphagia 05/08/2015   Acute sinusitis 10/02/2014   Other specified anxiety disorders 09/19/2014   Low back pain 07/03/2014   Shoulder joint pain 05/01/2014   Other malaise 02/01/2014   Chest discomfort 10/12/2013   Tobacco use disorder 10/12/2013   Essential hypertension 10/12/2013   Obesity 10/12/2013   Diabetes mellitus (HCC) 10/12/2013   Allergic rhinitis 03/23/2012   Trigger finger 03/23/2012   Vitamin D  deficiency 03/23/2012   Pure hypercholesterolemia 09/05/2009   Hypothyroidism 12/24/2006   Osteoarthritis 12/24/2006   Scoliosis 12/24/2006   PCP:  [facility]:   [facility] - [place], [place] - [facility] [address] [place] [address] Phone: [phone] Fax: [phone]     Social Drivers of Health (SDOH) Social History: SDOH Screenings   Food Insecurity: Patient Unable To Answer (09/18/2024)  Housing: Unknown (09/18/2024)  Transportation Needs: Patient Unable To Answer (09/18/2024)  Utilities: Patient Unable To Answer (09/18/2024)  Depression (PHQ2-9): Low Risk  (08/11/2024)  Social Connections: Patient Unable To Answer (09/18/2024)  Recent Concern: Social Connections - Socially Isolated (09/01/2024)  Tobacco Use: Medium Risk (09/14/2024)   Received from [facility]   SDOH  Interventions:     Readmission Risk Interventions     No data to display

## 2024-09-20 NOTE — Progress Notes (Signed)
 PULMONOLOGY         Date: 09/20/2024,   MRN# [id number] Michele House Oct 06, 1956     AdmissionWeight: 70.2 kg                 CurrentWeight: 70.2 kg  Referring provider: Acute on chr   CHIEF COMPLAINT:   COPD with complete atelectasis of left lower lobe  HISTORY OF PRESENT ILLNESS   This is a pleasant 68 year old female with history of diabetes morbid obesity CVA advanced systolic CHF and COPD with chronic hypoxemia essential hypertension dyslipidemia and thyroid  dysfunction.  Diagnosis of non-small cell lung cancer of the right upper lobe and February 2025.  Patient reports being in her usual state of health [facility] with worsening dyspnea and hypoxemia she required 8 L/min supplemental oxygen in the field by EMS.  Reports fatigue and progressive hypoxemia.  She had chest imaging performed in the emergency department showing previously noted right upper lobe lung mass as well as left lung atelectasis.  She was admitted to hospitalist service with empiric therapy for community-acquired pneumonia receiving IV Rocephin  and IV Zithromax  as well as 2.5 L of IV normal saline resuscitation.  During my interview she reports having normal bowel pattern without diarrhea, able to eat with assistance.  Denies have any fevers or sick contacts.  PCCM consultation placed for additional management evaluation of complete atelectasis of left lower lobe.   09/19/24- patient seen at bedside. She received hypertonic saline and mucomyst overnight with minimal expectoration of phlegm.  She has a weak cough and overall quite deconditioned due to active lung cancer of right upper lobe.  Her CBC is stable without leukocytosis.  Procalcitonin is mildly elevated.  Viral workup is negative thus far.  Blood cultures are too young to read.  Will continue empiric Therapy and bronchopulmonary hygiene with Mucomyst and hypertonic saline and perform bronchoscopy within the next few  days.  09/20/24- patient seen at bedside.  She still struggles to bring up phlegm/mucus with significant dyspnea and hypoxemia currenlty on 9L/min.  We plan to take her for bronchoscopy with therpeutic aspiration of tracheobronchial tree on Friday morning 730am to get her airways cleared and evaluate for any new malignant endobronchial lesion. She is in no distress but is dyspneic at rest.    PAST MEDICAL HISTORY   Past Medical History:  Diagnosis Date   Acid reflux    COPD (chronic obstructive pulmonary disease) (HCC)    Diabetes mellitus without complication (HCC)    Hyperlipidemia    Hypertension    Stroke [facility])    Thyroid  disease      SURGICAL HISTORY   Past Surgical History:  Procedure Laterality Date   appendectomy     APPENDECTOMY     BREAST BIOPSY Right    CORE W/CLIP - NEG   ESOPHAGOGASTRODUODENOSCOPY N/A 10/28/2023   Procedure: ESOPHAGOGASTRODUODENOSCOPY (EGD);  Surgeon: Jinny Carmine, MD;  Location: [facility] ENDOSCOPY;  Service: Endoscopy;  Laterality: N/A;   FLEXIBLE BRONCHOSCOPY Bilateral 11/12/2023   Procedure: FLEXIBLE BRONCHOSCOPY;  Surgeon: Parris Manna, MD;  Location: [facility] ORS;  Service: Thoracic;  Laterality: Bilateral;   IR EMBO ART  VEN HEMORR LYMPH EXTRAV  INC GUIDE ROADMAPPING  10/30/2023   IR IMAGING GUIDED PORT INSERTION  01/18/2024   IVC FILTER INSERTION N/A 11/09/2023   Procedure: IVC FILTER INSERTION;  Surgeon: Marea Selinda RAMAN, MD;  Location: [facility] INVASIVE CV LAB;  Service: Cardiovascular;  Laterality: N/A;   TOTAL VAGINAL HYSTERECTOMY  TUMOR REMOVAL     benign;stomach   VIDEO BRONCHOSCOPY WITH ENDOBRONCHIAL NAVIGATION Right 10/20/2023   Procedure: VIDEO BRONCHOSCOPY WITH ENDOBRONCHIAL NAVIGATION;  Surgeon: Parris Manna, MD;  Location: [facility] ORS;  Service: Thoracic;  Laterality: Right;   VIDEO BRONCHOSCOPY WITH ENDOBRONCHIAL NAVIGATION Right 11/12/2023   Procedure: VIDEO BRONCHOSCOPY WITH ENDOBRONCHIAL NAVIGATION;  Surgeon: Parris Manna, MD;   Location: [facility] ORS;  Service: Thoracic;  Laterality: Right;     FAMILY HISTORY   Family History  Problem Relation Age of Onset   Heart attack Mother    Hypertension Mother    Breast cancer Sister 57   Breast cancer Maternal Aunt        40'S   Breast cancer Maternal Grandmother      SOCIAL HISTORY   Social History   Tobacco Use   Smoking status: Former    Current packs/day: 0.00    Types: Cigarettes    Quit date: 12/15/1985    Years since quitting: 38.7   Smokeless tobacco: Never  Substance Use Topics   Alcohol use: No   Drug use: No     MEDICATIONS    Home Medication:     Current Medication:  Current Facility-Administered Medications:    (feeding supplement) PROSource Plus liquid 30 mL, 30 mL, Oral, BID BM, Mansy, Jan A, MD, 30 mL at 09/19/24 1038   acetaminophen  (TYLENOL ) tablet 650 mg, 650 mg, Oral, Q6H PRN **OR** acetaminophen  (TYLENOL ) suppository 650 mg, 650 mg, Rectal, Q6H PRN, Ponnala, Shruthi, MD   acetylcysteine  (MUCOMYST ) 20 % nebulizer / oral solution 4 mL, 4 mL, Nebulization, BID, Zamere Pasternak, MD, 4 mL at 09/20/24 0744   alum & mag hydroxide-simeth (MAALOX/MYLANTA) 200-200-20 MG/5ML suspension 20 mL, 20 mL, Oral, Q6H PRN, Mansy, Jan A, MD   aspirin  EC tablet 81 mg, 81 mg, Oral, Daily, Mansy, Jan A, MD, 81 mg at 09/19/24 1035   atorvastatin  (LIPITOR) tablet 20 mg, 20 mg, Oral, Daily, Mansy, Jan A, MD, 20 mg at 09/19/24 1033   azithromycin  (ZITHROMAX ) 500 mg in sodium chloride  0.9 % 250 mL IVPB, 500 mg, Intravenous, Q24H, Mansy, Jan A, MD, Last Rate: 250 mL/hr at 09/19/24 2115, 500 mg at 09/19/24 2115   busPIRone  (BUSPAR ) tablet 20 mg, 20 mg, Oral, BID, Mansy, Jan A, MD, 20 mg at 09/19/24 2117   calcium  carbonate (TUMS - dosed in mg elemental calcium ) chewable tablet 200 mg of elemental calcium , 1 tablet, Oral, Daily, Mansy, Jan A, MD, 200 mg of elemental calcium  at 09/19/24 1032   cefTRIAXone  (ROCEPHIN ) 2 g in sodium chloride  0.9 % 100 mL IVPB, 2 g,  Intravenous, Q24H, Mansy, Jan A, MD, Last Rate: 200 mL/hr at 09/19/24 2113, 2 g at 09/19/24 2113   chlorpheniramine-HYDROcodone  (TUSSIONEX) 10-8 MG/5ML suspension 5 mL, 5 mL, Oral, Q12H PRN, Mansy, Jan A, MD   collagenase  (SANTYL ) ointment 1 Application, 1 Application, Topical, Daily, Mansy, Jan A, MD, 1 Application at 09/19/24 1040   enoxaparin  (LOVENOX ) injection 40 mg, 40 mg, Subcutaneous, Q24H, Mansy, Jan A, MD, 40 mg at 09/19/24 1038   feeding supplement (GLUCERNA SHAKE) (GLUCERNA SHAKE) liquid 237 mL, 237 mL, Oral, BID BM, Mansy, Jan A, MD, 237 mL at 09/19/24 1354   ferrous sulfate  tablet 325 mg, 325 mg, Oral, BID WC, Mansy, Jan A, MD, 325 mg at 09/19/24 1836   furosemide  (LASIX ) tablet 20 mg, 20 mg, Oral, Daily, Ponnala, Shruthi, MD   insulin  aspart (novoLOG ) injection 0-20 Units, 0-20 Units, Subcutaneous, TID WC, Mansy, Jan A,  MD, 7 Units at 09/19/24 1834   insulin  aspart (novoLOG ) injection 0-5 Units, 0-5 Units, Subcutaneous, QHS, Mansy, Jan A, MD, 2 Units at 09/19/24 2118   insulin  aspart (novoLOG ) injection 4 Units, 4 Units, Subcutaneous, TID WC, Mansy, Madison LABOR, MD, 4 Units at 09/19/24 1835   insulin  glargine-yfgn (SEMGLEE ) injection 18 Units, 18 Units, Subcutaneous, Daily, Mansy, Jan A, MD, 18 Units at 09/19/24 1037   ipratropium-albuterol  (DUONEB) 0.5-2.5 (3) MG/3ML nebulizer solution 3 mL, 3 mL, Nebulization, Q4H PRN, Dail Rankin RAMAN, RPH, 3 mL at 09/18/24 0041   ipratropium-albuterol  (DUONEB) 0.5-2.5 (3) MG/3ML nebulizer solution 3 mL, 3 mL, Nebulization, TID, Ponnala, Shruthi, MD, 3 mL at 09/20/24 0740   levETIRAcetam  (KEPPRA ) tablet 500 mg, 500 mg, Oral, BID, Mansy, Jan A, MD, 500 mg at 09/19/24 2117   levothyroxine  (SYNTHROID ) tablet 125 mcg, 125 mcg, Oral, Q0600, Mansy, Jan A, MD, 125 mcg at 09/20/24 0505   lidocaine  (LIDODERM ) 5 % 1 patch, 1 patch, Transdermal, Q24H, Mansy, Jan A, MD   linagliptin  (TRADJENTA ) tablet 5 mg, 5 mg, Oral, Daily, Mansy, Jan A, MD, 5 mg at 09/19/24 1032    magnesium  hydroxide (MILK OF MAGNESIA) suspension 30 mL, 30 mL, Oral, Daily PRN, Mansy, Jan A, MD   methylPREDNISolone  sodium succinate (SOLU-MEDROL ) 125 mg/2 mL injection 60 mg, 60 mg, Intravenous, Q24H, Casey Fye, MD, 60 mg at 09/19/24 1036   metoprolol  succinate (TOPROL -XL) 24 hr tablet 50 mg, 50 mg, Oral, Daily, Mansy, Jan A, MD, 50 mg at 09/19/24 1033   midodrine  (PROAMATINE ) tablet 10 mg, 10 mg, Oral, TID PRN, Ponnala, Shruthi, MD   multivitamin with minerals tablet 1 tablet, 1 tablet, Oral, Daily, Mansy, Jan A, MD, 1 tablet at 09/19/24 1035   nutrition supplement (JUVEN) (JUVEN) powder packet 1 packet, 1 packet, Oral, BID BM, Mansy, Jan A, MD   nystatin  (MYCOSTATIN /NYSTOP ) topical powder 1 Application, 1 Application, Topical, BID, Mansy, Jan A, MD, 1 Application at 09/19/24 2118   ondansetron  (ZOFRAN ) tablet 4 mg, 4 mg, Oral, Q6H PRN **OR** ondansetron  (ZOFRAN ) injection 4 mg, 4 mg, Intravenous, Q6H PRN, Mansy, Jan A, MD   pantoprazole  (PROTONIX ) EC tablet 40 mg, 40 mg, Oral, BID AC, Mansy, Jan A, MD, 40 mg at 09/19/24 1836   polyethylene glycol (MIRALAX  / GLYCOLAX ) packet 17 g, 17 g, Oral, Daily, Mansy, Jan A, MD, 17 g at 09/19/24 1036   pregabalin  (LYRICA ) capsule 50 mg, 50 mg, Oral, BID, Mansy, Jan A, MD, 50 mg at 09/19/24 2117   rOPINIRole  (REQUIP  XL) 24 hr tablet 4 mg, 4 mg, Oral, QHS, Mansy, Jan A, MD, 4 mg at 09/19/24 2218   senna (SENOKOT) tablet 8.6 mg, 1 tablet, Oral, BID, Mansy, Jan A, MD, 8.6 mg at 09/19/24 2117   sertraline  (ZOLOFT ) tablet 100 mg, 100 mg, Oral, Daily, Ponnala, Shruthi, MD, 100 mg at 09/19/24 1032   sodium chloride  HYPERTONIC 3 % nebulizer solution 4 mL, 4 mL, Nebulization, BID, Ponnala, Shruthi, MD, 4 mL at 09/20/24 0744   spironolactone  (ALDACTONE ) tablet 25 mg, 25 mg, Oral, Daily, Ponnala, Shruthi, MD, 25 mg at 09/19/24 1344   traMADol  (ULTRAM ) tablet 50 mg, 50 mg, Oral, Q6H PRN, Mansy, Jan A, MD, 50 mg at 09/19/24 9573   traZODone  (DESYREL ) tablet 25 mg,  25 mg, Oral, QHS PRN, Mansy, Jan A, MD   zinc  oxide 20 % ointment 1 Application, 1 Application, Topical, BID, Mansy, Madison LABOR, MD, 1 Application at 09/19/24 2119  Facility-Administered Medications Ordered in Other Encounters:  oxyCODONE  (Oxy IR/ROXICODONE ) immediate release tablet 5 mg, 5 mg, Oral, Once, Agrawal, Kavita, MD    ALLERGIES   Penicillins, Aspirin , and Sulfa antibiotics     REVIEW OF SYSTEMS    Review of Systems:  Gen:  Denies  fever, sweats, chills weigh loss  HEENT: Denies blurred vision, double vision, ear pain, eye pain, hearing loss, nose bleeds, sore throat Cardiac:  No dizziness, chest pain or heaviness, chest tightness,edema Resp:   reports dyspnea chronically  Gi: Denies swallowing difficulty, stomach pain, nausea or vomiting, diarrhea, constipation, bowel incontinence Gu:  Denies bladder incontinence, burning urine Ext:   Denies Joint pain, stiffness or swelling Skin: Denies  skin rash, easy bruising or bleeding or hives Endoc:  Denies polyuria, polydipsia , polyphagia or weight change Psych:   Denies depression, insomnia or hallucinations   Other:  All other systems negative   VS: BP 121/64 (BP Location: Right Arm)   Pulse 72   Temp 97.7 F (36.5 C)   Resp 20   Ht 5' 1 (1.549 m)   Wt 70.2 kg   LMP 12/24/1998 Comment: prior to her hysterectomy  SpO2 97%   BMI 29.24 kg/m      PHYSICAL EXAM    GENERAL:NAD, no fevers, chills, no weakness no fatigue HEAD: Normocephalic, atraumatic.  EYES: Pupils equal, round, reactive to light. Extraocular muscles intact. No scleral icterus.  MOUTH: Moist mucosal membrane. Dentition intact. No abscess noted.  EAR, NOSE, THROAT: Clear without exudates. No external lesions.  NECK: Supple. No thyromegaly. No nodules. No JVD.  PULMONARY: decreased breath sounds with mild rhonchi worse at bases bilaterally.  CARDIOVASCULAR: S1 and S2. Regular rate and rhythm. No murmurs, rubs, or gallops. No edema. Pedal pulses  2+ bilaterally.  GASTROINTESTINAL: Soft, nontender, nondistended. No masses. Positive bowel sounds. No hepatosplenomegaly.  MUSCULOSKELETAL: No swelling, clubbing, or edema. Range of motion full in all extremities.  NEUROLOGIC: Cranial nerves II through XII are intact. No gross focal neurological deficits. Sensation intact. Reflexes intact.  SKIN: No ulceration, lesions, rashes, or cyanosis. Skin warm and dry. Turgor intact.  PSYCHIATRIC: Mood, affect within normal limits. The patient is awake, alert and oriented x 3. Insight, judgment intact.       IMAGING     Narrative & Impression  EXAM: CTA CHEST 09/17/2024 10:07:39 PM   TECHNIQUE: CTA of the chest was performed after the administration of intravenous contrast. Multiplanar reformatted images are provided for review. MIP images are provided for review. Automated exposure control, iterative reconstruction, and/or weight based adjustment of the mA/kV was utilized to reduce the radiation dose to as low as reasonably achievable.   COMPARISON: None available.   CLINICAL HISTORY: Lung cancer patient with right upper lobe neoplasm presenting with hypoxemia and respiratory distress. Pulmonary embolism (PE) suspected, high prob.   FINDINGS:   PULMONARY ARTERIES: Pulmonary arteries are adequately opacified for evaluation. The pulmonary arteries are slightly prominent but no arterial embolus is seen.   MEDIASTINUM: There is mild cardiomegaly with a slight right chamber predominance with RV/LV ratio of 1.06. There is no pericardial effusion. There is atherosclerosis in the aorta, great vessels, small amount in the left main and proximal LAD coronary artery. No aortic dissection or aneurysm.   The pulmonary veins are nondilated. There is mild chronic enlargement and heterogeneity of the thyroid  gland without a dominant nodule.   There is a chronically patulous esophagus without wall thickening. IVC filter in place. Left chest  mediport with IJ approach catheter terminating in the  SVC.   LYMPH NODES: Subcarinal adenopathy is unchanged up to 1.2 cm on axial 58 of series 6, with increased prominence of AP window and left hilar nodes up to 1.2 cm, increased prominence of right hilar nodes up to 1.5 cm on axial 67.   LUNGS AND PLEURA: Lungs are emphysematous. There is mild layering fluid in the distal trachea. There is layering fluid in the bilateral main bronchi, on the left extending into and obstructing the main lower lobe and lower lobe segmental bronchi, with consolidation throughout the left lower lobe consistent with a consolidated pneumonia.   There is a right upper lobe suprahilar mass encasing central bronchovascular structures with bronchial narrowing.   It is difficult to distinguish between the mass and adjacent pulmonary consolidation. In total, the mass and consolidation today measure 5 x 6.6 cm on axial 47 of series 6, not grossly changed from the PET CT but certainly significantly larger than on 05/24/2024. There is surrounding spiculation and stranding to pleural surfaces and areas of pleural thickening in the right chest superiorly and medially probably due to pleural metastatic disease and hypermetabolic on the PET CT.   There is a small layering right pleural effusion with partial loculation in the superior chest. In the right lower lobe, several coarsely nodular opacities are again noted, consistent with patchy airspace disease versus intraorgan metastases.   The largest of these is 2.3 x 1.9 cm on series 8 axial 82, unchanged.   Elsewhere, there is progressive diffuse interstitial thickening and micronodularity relatively sparing the right middle lobe and first seen on PET CT but worsened since.   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 No pneumothorax.   UPPER ABDOMEN: The gallbladder is dilated and  there are tiny layering stones posteriorly without wall thickening.   There are stones in the right renal collecting system and renal pelvis, largest is 6 mm just above the UPJ. There is mild right hydronephrosis.   The liver is steatotic. There is a 2 cm Bosniak 1 cyst in the right upper pole.   SOFT TISSUES AND BONES: There is osteopenia and kyphosis of the thoracic spine. No destructive bone lesions. No acute soft tissue abnormality.   IMPRESSION: 1. No evidence of pulmonary embolism. 2. Left lower lobe consolidation with mucus impaction and complete obstruction of the left lower lobe bronchus, consistent with pneumonia. 3. Cardiomegaly with right chamber predominance indicating right heart dysfunction. the pulmonary arteries are slightly prominent. 4. Fluid in the main bronchi, obstructing the left lower lobe main and segmental bronchi with complete consolidation/ atelectasis of the left lower lobe. 5. Right upper lobe suprahilar mass with bronchovascular encasement and bronchial narrowing, overall size approximately 5 x 6.6 cm, not grossly changed from recent PET CT but increased from 05/24/2024, with associated pleural thickening suspicious for pleural metastatic disease and small partially loculated right pleural effusion . 6. 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 7. Mediastinal and hilar adenopathy with interval increase in AP window and bilateral hilar nodes up to 1.5 cm and stable subcarinal node up to 1.2 cm. 8. Right nephrolithiasis including a 6 mm renal pelvis just above the UPJ with mild right hydronephrosis. 9. Emphysema, with aortic and coronary atherosclerosis.   Electronically signed by: Francis Quam MD 09/17/2024 10:53 PM EST RP Workstation: [id number]      Result History    ASSESSMENT/PLAN   Complete atelectasis of left lower lobe Patient with known  advanced COPD and  non-small cell lung cancer , Initially will attempt noninvasive modalities with Mucomyst nebulized solution 20% 4 mL twice daily - Incentive spirometry and flutter valve to be used by patient multiple times each hour  -Discussed care plan with RT today- -patient receiving hypertonic saline nebulized for bronchopulmonary hygiene -patient has received 2 treatments of Mucomyst over the last 24 hours with minimal improvement we will consider bronchoscopy during this hospitalization   Community-acquired pneumonia of left lung Agree with IV Rocephin  and Zithromax  with a goal of 7 days total therapy     Advanced COPD with chronic hypoxemia Continue nebulizer therapy as needed -Solu-Medrol  is been reduced from 80 mg to 60 IV daily   Morbid obesity and physical deconditioning - Contributing to atelectasis, recommendation for PT OT as soon as able    Thank you for allowing me to participate in the care of this patient.   Patient/Family are satisfied with care plan and all questions have been answered.    Provider disclosure: Patient with at least one acute or chronic illness or injury that poses a threat to life or bodily function and is being managed actively during this encounter.  All of the below services have been performed independently by signing provider:  review of prior documentation from internal and or external health records.  Review of previous and current lab results.  Interview and comprehensive assessment during patient visit today. Review of current and previous chest radiographs/CT scans. Discussion of management and test interpretation with health care team and patient/family.   This document was prepared using Dragon voice recognition software and may include unintentional dictation errors.     Karelly Dewalt, M.D.  Division of Pulmonary & Critical Care Medicine

## 2024-09-20 NOTE — Progress Notes (Signed)
 PROGRESS NOTE    Michele House  FMW:[id number] DOB: Oct 06, 1956 DOA: 09/17/2024 PCP: [facility]  Chief Complaint  Patient presents with   Abnormal Lab    SNF reported 62    Hospital Course:  Michele House is a 68 y.o. 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  On presentation also had worsening dyspnea, hypoxia.  Patient admitted for sepsis due to pneumonia, hospital course as below  11/19: Discontinue telemetry, transfer to any MedSurg  Subjective:  Feeling much better.  Breathing improving.  Less coughing   Objective: Vitals:   09/20/24 0803 09/20/24 1217 09/20/24 1331 09/20/24 1619  BP: (!) 139/57 121/61  (!) 140/73  Pulse: 73 75  83  Resp: 18 18  16   Temp: (!) 97.5 F (36.4 C) (!) 97.4 F (36.3 C)  98.6 F (37 C)  TempSrc:      SpO2: 100% 99% 95% 100%  Weight:      Height:        Intake/Output Summary (Last 24 hours) at 09/20/2024 1950 Last data filed at 09/20/2024 1904 Gross per 24 hour  Intake 470 ml  Output 1850 ml  Net -1380 ml   Filed Weights   09/17/24 1846  Weight: 70.2 kg    Examination: GENERAL:  68 y.o.-year-old patient lying in the bed with no acute distress.  NECK:  Supple, no jugular venous distention. No thyroid  enlargement, no tenderness.  LUNGS: Diminished bibasilar breath sounds at bases.  And heart vesicular breathing.  No use of accessory muscles of respiration.  CARDIOVASCULAR: Regular rate and rhythm, S1, S2 normal. No murmurs, rubs, or gallops.  ABDOMEN: Soft, benign EXTREMITIES: No pedal edema, cyanosis, or clubbing.  NEUROLOGIC: Cranial nerves II through XII are intact. Muscle strength 5/5 in all  extremities. Sensation intact.  PSYCHIATRIC: Awake, alert  Assessment & Plan:  Sepsis due to pneumonia Complete atelectasis left lower lobe Non-small cell lung cancer - LA normal, No leukocytosis - Flu/COVID/RSV negative, UA with squamous cells (likely contaminant) - CTA LLL consolidation with mucus impaction and complete obstruction of LLL bronchus.  Fluid in the main bronchi, complete consolidation/atelectasis of left lower lobe, Cardiomegaly with right chamber prominence indicating right heart dysfunction. RUL suprahilar mass bronchovascular encasement and bronchial narrowing, size approximately 5 into 6.6 cm, not grossly changed -with associated pleural thickening suspicious for pleural metastatic disease. Progressive diffuse interstitial thickening and micronodularity, consistent with lymphangitic carcinomatosis. Mediastinal and hilar adenopathy - Continue oral azithromycin  and Ceftin - Mucomyst nebulized solution, hypertonic saline nebs - Seen by pulmonary, appreciate recs.  Plan for bronchoscopy this Friday on 11/21    COPD with acute exacerbation - Continue IV Solu-Medrol , DuoNebs.  On azithromycin  and Ceftin  Acute on chronic respiratory failure with hypoxia and hypercapnia - Likely secondary to pneumonia and COPD exacerbation - Wean O2 as able.  She uses 3 L oxygen at baseline.  Currently on the same  Mild hyperkalemia - resolved - s/p Lokelma  5g  Mild right hydronephrosis - Imaging shows Right nephrolithiasis including a 6 mm renal pelvis just above the UPJ with mild right hydronephrosis - If new/ worsening right flank pain -recommend CT stone protocol, otherwise can follow-up outpatient - Seen by urology,  appreciate recs  Chronic HFrEF (heart failure with reduced ejection fraction) - S/p IV fluids on admission, 1 dose IV Lasix  today.  Resume home Lasix , spironolactone  - Continue metoprolol , Tradjenta   Dementia - Supportive care   History of right leg DVT, status post  IVC filter - Happened January of this year, IVC filter placed instead of anticoagulation due to concurrent severe GI bleed.     History of duodenal ulcer and upper GI bleed  - During same hospitalization in January, patient also developed severe duodenum bleeding, subsequently underwent EGD and clipping but clipping failed and patient received IR embolization 2 days later. - Monito Hb   Seizure disorder - Continue Keppra    IDDM - Continue Lantus  18units, Novolog  4u TID, SSI - Also on steroids  -- Palliative consult for GOC (code status) - as patient has poor pulmonary reserve and cognitive decline -- Likely return back to long-term care  DVT prophylaxis: Lovenox  SQ   Code Status: Full Code Disposition:  TBD  Consultants:  Treatment Team:  Consulting Physician: Parris Manna, MD Consulting Physician: Geralynn Charleston, MD Consulting Physician: Georganne Penne SAUNDERS, MDPulmonary Urology  Procedures:  None  Antimicrobials:  Anti-infectives (From admission, onward)    Start     Dose/Rate Route Frequency Ordered Stop   09/20/24 2200  azithromycin  (ZITHROMAX ) tablet 500 mg        500 mg Oral Daily at bedtime 09/20/24 1407 09/22/24 2159   09/20/24 1700  cefUROXime  (CEFTIN ) tablet 500 mg        500 mg Oral 2 times daily with meals 09/20/24 1446 09/24/24 1659   09/18/24 2000  cefTRIAXone  (ROCEPHIN ) 2 g in sodium chloride  0.9 % 100 mL IVPB  Status:  Discontinued        2 g 200 mL/hr over 30 Minutes Intravenous Every 24 hours 09/17/24 2337 09/20/24 1445   09/18/24 2000  azithromycin  (ZITHROMAX ) 500 mg in sodium chloride  0.9 % 250 mL IVPB  Status:  Discontinued        500 mg 250 mL/hr over 60 Minutes Intravenous Every 24 hours 09/17/24 2337 09/20/24 1407   09/17/24 1915  cefTRIAXone  (ROCEPHIN ) 2 g in sodium chloride  0.9 % 100 mL IVPB        2 g 200 mL/hr over 30 Minutes Intravenous Once 09/17/24 1909 09/17/24 1954   09/17/24 1915  azithromycin  (ZITHROMAX ) 500 mg in sodium chloride  0.9  % 250 mL IVPB        500 mg 250 mL/hr over 60 Minutes Intravenous  Once 09/17/24 1909 09/17/24 2114       Data Reviewed: I have personally reviewed following labs and imaging studies CBC: Recent Labs  Lab 09/17/24 1855 09/18/24 0830 09/19/24 0518 09/20/24 0533  WBC 6.2 6.7 6.2 3.6*  NEUTROABS 4.9  --   --   --   HGB 10.0* 9.5* 9.2* 9.1*  HCT 33.5* 32.0* 30.7* 30.1*  MCV 86.8 87.9 84.6 84.1  PLT 210 164 223 158   Basic Metabolic Panel: Recent Labs  Lab 09/17/24 1855 09/18/24 0830 09/19/24 0518 09/20/24 0533  NA 143 142 140 144  K 4.7 5.2* 4.3 4.2  CL 97* 102 99 99  CO2 38* 33* 35* 40*  GLUCOSE 114* 205* 143* 166*  BUN 28* 32* 36* 27*  CREATININE 0.74 0.77 0.78 0.70  CALCIUM  11.7* 10.6* 11.4* 11.4*   GFR: Estimated Creatinine Clearance: 58.7 mL/min (by C-G formula based on SCr of 0.7 mg/dL). Liver Function Tests: Recent Labs  Lab 09/17/24 1855  AST 34  ALT 24  ALKPHOS 83  BILITOT 0.3  PROT 5.8*  ALBUMIN 3.5   CBG: Recent Labs  Lab 09/19/24 1625 09/19/24 2040 09/20/24 0805 09/20/24 1218 09/20/24 1621  GLUCAP 246* 229* 135* 240* 324*    Recent Results (from the past 240 hours)  Blood Culture (routine x 2)     Status: None (Preliminary result)   Collection Time: 09/17/24  6:55 PM   Specimen: BLOOD  Result Value Ref Range Status   Specimen Description BLOOD LEFT ANTECUBITAL  Final   Special Requests   Final    BOTTLES DRAWN AEROBIC AND ANAEROBIC Blood Culture results may not be optimal due to an inadequate volume of blood received in culture bottles   Culture   Final    NO GROWTH 3 DAYS Performed at [facility], [address] [place], [place] [address]    Report Status PENDING  Incomplete  Resp panel by RT-PCR (RSV, Flu A&B, Covid) Anterior Nasal Swab     Status: None   Collection Time: 09/17/24  7:10 PM   Specimen: Anterior Nasal Swab  Result Value Ref Range Status   SARS Coronavirus 2 by RT PCR NEGATIVE NEGATIVE Final     Comment: (NOTE) SARS-CoV-2 target nucleic acids are NOT DETECTED.  The SARS-CoV-2 RNA is generally detectable in upper respiratory specimens during the acute phase of infection. The lowest concentration of SARS-CoV-2 viral copies this assay can detect is 138 copies/mL. A negative result does not preclude SARS-Cov-2 infection and should not be used as the sole basis for treatment or other patient management decisions. A negative result may occur with  improper specimen collection/handling, submission of specimen other than nasopharyngeal swab, presence of viral mutation(s) within the areas targeted by this assay, and inadequate number of viral copies(<138 copies/mL). A negative result must be combined with clinical observations, patient history, and epidemiological information. The expected result is Negative.  Fact Sheet for Patients:  bloggercourse.com  Fact Sheet for Healthcare Providers:  seriousbroker.it  This test is no t yet approved or cleared by the [place]  FDA and  has been authorized for detection and/or diagnosis of SARS-CoV-2 by FDA under an Emergency Use Authorization (EUA). This EUA will remain  in effect (meaning this test can be used) for the duration of the COVID-19 declaration under Section 564(b)(1) of the Act, 21 U.S.C.section 360bbb-3(b)(1), unless the authorization is terminated  or revoked sooner.       Influenza A by PCR NEGATIVE NEGATIVE Final   Influenza B by PCR NEGATIVE NEGATIVE Final    Comment: (NOTE) The Xpert Xpress SARS-CoV-2/FLU/RSV plus assay is intended as an aid in the diagnosis of influenza from Nasopharyngeal swab specimens and should not be used as a sole basis for treatment. Nasal washings and aspirates are unacceptable for Xpert Xpress SARS-CoV-2/FLU/RSV testing.  Fact Sheet for Patients: bloggercourse.com  Fact Sheet for Healthcare  Providers: seriousbroker.it  This test is not yet approved or cleared by the [place]  FDA and has been authorized for detection and/or diagnosis of SARS-CoV-2 by FDA under an Emergency Use Authorization (EUA). This EUA will remain in effect (meaning this test can be used) for the duration of the COVID-19 declaration under Section 564(b)(1) of the Act, 21 U.S.C. section 360bbb-3(b)(1), unless the authorization is terminated or revoked.     Resp Syncytial Virus by PCR NEGATIVE NEGATIVE Final    Comment: (NOTE) Fact Sheet for Patients: bloggercourse.com  Fact Sheet for Healthcare Providers: seriousbroker.it  This test is  not yet approved or cleared by the [place]  FDA and has been authorized for detection and/or diagnosis of SARS-CoV-2 by FDA under an Emergency Use Authorization (EUA). This EUA will remain in effect (meaning this test can be used) for the duration of the COVID-19 declaration under Section 564(b)(1) of the Act, 21 U.S.C. section 360bbb-3(b)(1), unless the authorization is terminated or revoked.  Performed at [facility], [address] [place], [place] [address]   Blood Culture (routine x 2)     Status: None (Preliminary result)   Collection Time: 09/17/24  8:44 PM   Specimen: BLOOD  Result Value Ref Range Status   Specimen Description BLOOD BLOOD RIGHT HAND  Final   Special Requests   Final    BOTTLES DRAWN AEROBIC AND ANAEROBIC Blood Culture adequate volume   Culture   Final    NO GROWTH 3 DAYS Performed at [facility], [address] [place], [place] [address]    Report Status PENDING  Incomplete     Radiology Studies: No results found.   Scheduled Meds:  (feeding supplement) PROSource Plus  30 mL Oral BID BM   acetylcysteine  4 mL Nebulization BID   aspirin  EC  81 mg Oral Daily   atorvastatin   20 mg Oral Daily   azithromycin   500 mg Oral  QHS   busPIRone  20 mg Oral BID   calcium  carbonate  1 tablet Oral Daily   cefUROXime  500 mg Oral BID WC   collagenase  1 Application Topical Daily   enoxaparin  (LOVENOX ) injection  40 mg Subcutaneous Q24H   feeding supplement (GLUCERNA SHAKE)  237 mL Oral BID BM   ferrous sulfate  325 mg Oral BID WC   furosemide   20 mg Oral Daily   insulin  aspart  0-20 Units Subcutaneous TID WC   insulin  aspart  0-5 Units Subcutaneous QHS   insulin  aspart  4 Units Subcutaneous TID WC   insulin  glargine-yfgn  18 Units Subcutaneous Daily   ipratropium-albuterol   3 mL Nebulization TID   levETIRAcetam   500 mg Oral BID   levothyroxine   125 mcg Oral Q0600   lidocaine   1 patch Transdermal Q24H   linagliptin   5 mg Oral Daily   methylPREDNISolone  (SOLU-MEDROL ) injection  60 mg Intravenous Q24H   metoprolol succinate  50 mg Oral Daily   multivitamin with minerals  1 tablet Oral Daily   nutrition supplement (JUVEN)  1 packet Oral BID BM   nystatin  1 Application Topical BID   pantoprazole   40 mg Oral BID AC   polyethylene glycol  17 g Oral Daily   pregabalin   50 mg Oral BID   rOPINIRole   4 mg Oral QHS   senna  1 tablet Oral BID   sertraline   100 mg Oral Daily   sodium chloride  HYPERTONIC  4 mL Nebulization BID   spironolactone  25 mg Oral Daily   zinc oxide  1 Application Topical BID   Continuous Infusions:  Time spent 35 minutes   LOS: 3 days  MDM: Patient is high risk for one or more organ failure.  They necessitate ongoing hospitalization for continued IV therapies and subsequent lab monitoring. Total time spent interpreting labs and vitals, reviewing the medical record, coordinating care amongst consultants and care team members, directly assessing and discussing care with the patient and/or family: 55 min Joannie Medine Maree, MD [facility]  To contact the attending physician between 7A-7P please use Epic Chat. To contact the covering physician during  after hours 7P-7A, please review Amion.   09/20/2024, 7:50 PM   *This document has been created with the assistance of dictation software. Please excuse typographical errors. *

## 2024-09-20 NOTE — NC FL2 (Signed)
 [place]  MEDICAID FL2 LEVEL OF CARE FORM     IDENTIFICATION  Patient Name: Michele House Birthdate: [birth year] Sex: female Admission Date (Current Location): 09/17/2024  [facility] and [place] Number:  Chiropodist and Address:  [facility], [address], [place], [place] [address]      Provider Number: [id number]  Attending Physician Name and Address:  Maree Hue, MD  Relative Name and Phone Number:       Current Level of Care: Hospital Recommended Level of Care: Skilled Nursing Facility Prior Approval Number:    Date Approved/Denied:   PASRR Number: [id number]  Discharge Plan: SNF    Current Diagnoses: Patient Active Problem List   Diagnosis Date Noted   Nephrolithiasis 09/18/2024   Sepsis due to pneumonia (HCC) 09/17/2024   COPD with acute exacerbation (HCC) 09/17/2024   Acute on chronic respiratory failure with hypoxia and hypercapnia (HCC) 09/17/2024   Seizure disorder (HCC) 09/17/2024   Dyslipidemia 09/17/2024   HFrEF (heart failure with reduced ejection fraction) (HCC) 09/17/2024   Type 2 diabetes mellitus with peripheral neuropathy (HCC) 09/17/2024   Acute respiratory failure with hypoxia and hypercarbia (HCC) 09/02/2024   Pressure injury of skin 09/02/2024   Encounter for antineoplastic chemotherapy 02/04/2024   IDA (iron  deficiency anemia) 01/07/2024   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) 12/17/2023   Anemia 12/17/2023   Acute deep vein thrombosis (DVT) of right lower extremity (HCC) 11/09/2023   Bilateral leg pain 11/09/2023   Melena 10/28/2023   Gastrointestinal hemorrhage with melena 10/28/2023   Duodenal ulcer 10/28/2023   CVA (cerebral vascular accident) (HCC) 10/14/2023   Hypotension 10/11/2023   Hypernatremia 10/11/2023   Uncontrolled type 2 diabetes mellitus with hypoglycemia, without long-term current use of insulin  (HCC) 10/08/2023   Acute metabolic encephalopathy 10/06/2023   Altered mental  status 10/02/2023   UTI (urinary tract infection) 10/02/2023   (HFpEF) heart failure with preserved ejection fraction (HCC) 10/02/2023   Pneumonia 09/24/2023   COPD (chronic obstructive pulmonary disease) (HCC) 09/24/2023   Acute CHF (congestive heart failure) (HCC) 09/23/2023   COPD exacerbation (HCC) 06/07/2023   Acute on chronic respiratory failure with hypoxia (HCC) 06/07/2023   Allergic reaction 06/07/2023   Disequilibrium 07/16/2015   Pain in right shoulder 05/28/2015   Dysphagia 05/08/2015   Acute sinusitis 10/02/2014   Other specified anxiety disorders 09/19/2014   Low back pain 07/03/2014   Shoulder joint pain 05/01/2014   Other malaise 02/01/2014   Chest discomfort 10/12/2013   Tobacco use disorder 10/12/2013   Essential hypertension 10/12/2013   Obesity 10/12/2013   Diabetes mellitus (HCC) 10/12/2013   Allergic rhinitis 03/23/2012   Trigger finger 03/23/2012   Vitamin D  deficiency 03/23/2012   Pure hypercholesterolemia 09/05/2009   Hypothyroidism 12/24/2006   Osteoarthritis 12/24/2006   Scoliosis 12/24/2006    Orientation RESPIRATION BLADDER Height & Weight     Self, Place  O2 (Nasal Cannula 3 L) Incontinent, External catheter Weight: 154 lb 12.2 oz (70.2 kg) Height:  5' 1 (154.9 cm)  BEHAVIORAL SYMPTOMS/MOOD NEUROLOGICAL BOWEL NUTRITION STATUS   (None)  (Seizure disorder) Continent Diet (Heart healthy/carb modified. Please CHOP all meats and add Gravies to moisten. NO SALADS. CHOP Spaghetti.)  AMBULATORY STATUS COMMUNICATION OF NEEDS Skin   Extensive Assist Verbally Other (Comment), PU Stage and Appropriate Care (Erythema/redness.)     PU Stage 3 Dressing:  (Coccyx: Foam.)                 Personal Care  Assistance Level of Assistance  Bathing, Feeding, Dressing Bathing Assistance: Maximum assistance Feeding assistance: Limited assistance Dressing Assistance: Maximum assistance     Functional Limitations Info  Sight, Hearing, Speech Sight Info:  Adequate Hearing Info: Adequate Speech Info: Adequate    SPECIAL CARE FACTORS FREQUENCY  PT (By licensed PT)     PT Frequency: PT evaluation              Contractures Contractures Info: Not present    Additional Factors Info  Code Status, Allergies Code Status Info: Full code Allergies Info: Penicillins, Aspirin , Sulfa antibiotics           Current Medications (09/20/2024):  This is the current hospital active medication list Current Facility-Administered Medications  Medication Dose Route Frequency Provider Last Rate Last Admin   (feeding supplement) PROSource Plus liquid 30 mL  30 mL Oral BID BM Mansy, Jan A, MD   30 mL at 09/20/24 1006   acetaminophen  (TYLENOL ) tablet 650 mg  650 mg Oral Q6H PRN Ponnala, Shruthi, MD       Or   acetaminophen  (TYLENOL ) suppository 650 mg  650 mg Rectal Q6H PRN Ponnala, Shruthi, MD       acetylcysteine (MUCOMYST) 20 % nebulizer / oral solution 4 mL  4 mL Nebulization BID Aleskerov, Fuad, MD   4 mL at 09/20/24 0744   alum & mag hydroxide-simeth (MAALOX/MYLANTA) 200-200-20 MG/5ML suspension 20 mL  20 mL Oral Q6H PRN Mansy, Jan A, MD       aspirin  EC tablet 81 mg  81 mg Oral Daily Mansy, Jan A, MD   81 mg at 09/20/24 0951   atorvastatin  (LIPITOR) tablet 20 mg  20 mg Oral Daily Mansy, Jan A, MD   20 mg at 09/20/24 9048   azithromycin  (ZITHROMAX ) 500 mg in sodium chloride  0.9 % 250 mL IVPB  500 mg Intravenous Q24H Mansy, Jan A, MD 250 mL/hr at 09/19/24 2115 500 mg at 09/19/24 2115   busPIRone (BUSPAR) tablet 20 mg  20 mg Oral BID Mansy, Jan A, MD   20 mg at 09/20/24 9048   calcium  carbonate (TUMS - dosed in mg elemental calcium ) chewable tablet 200 mg of elemental calcium   1 tablet Oral Daily Mansy, Jan A, MD   200 mg of elemental calcium  at 09/20/24 0951   cefTRIAXone  (ROCEPHIN ) 2 g in sodium chloride  0.9 % 100 mL IVPB  2 g Intravenous Q24H Mansy, Jan A, MD 200 mL/hr at 09/19/24 2113 2 g at 09/19/24 2113   chlorpheniramine-HYDROcodone   (TUSSIONEX) 10-8 MG/5ML suspension 5 mL  5 mL Oral Q12H PRN Mansy, Jan A, MD       collagenase (SANTYL) ointment 1 Application  1 Application Topical Daily Mansy, Jan A, MD   1 Application at 09/20/24 1006   enoxaparin  (LOVENOX ) injection 40 mg  40 mg Subcutaneous Q24H Mansy, Jan A, MD   40 mg at 09/20/24 0955   feeding supplement (GLUCERNA SHAKE) (GLUCERNA SHAKE) liquid 237 mL  237 mL Oral BID BM Mansy, Jan A, MD   237 mL at 09/20/24 1006   ferrous sulfate tablet 325 mg  325 mg Oral BID WC Mansy, Jan A, MD   325 mg at 09/20/24 0950   furosemide  (LASIX ) tablet 20 mg  20 mg Oral Daily Ponnala, Shruthi, MD   20 mg at 09/20/24 0951   insulin  aspart (novoLOG ) injection 0-20 Units  0-20 Units Subcutaneous TID WC Mansy, Madison LABOR, MD   3 Units at 09/20/24 [id number]  insulin  aspart (novoLOG ) injection 0-5 Units  0-5 Units Subcutaneous QHS Mansy, Jan A, MD   2 Units at 09/19/24 2118   insulin  aspart (novoLOG ) injection 4 Units  4 Units Subcutaneous TID WC Mansy, Madison LABOR, MD   4 Units at 09/20/24 9046   insulin  glargine-yfgn (SEMGLEE ) injection 18 Units  18 Units Subcutaneous Daily Mansy, Jan A, MD   18 Units at 09/20/24 1005   ipratropium-albuterol  (DUONEB) 0.5-2.5 (3) MG/3ML nebulizer solution 3 mL  3 mL Nebulization Q4H PRN Dail Rankin RAMAN, RPH   3 mL at 09/18/24 0041   ipratropium-albuterol  (DUONEB) 0.5-2.5 (3) MG/3ML nebulizer solution 3 mL  3 mL Nebulization TID Ponnala, Shruthi, MD   3 mL at 09/20/24 0740   levETIRAcetam  (KEPPRA ) tablet 500 mg  500 mg Oral BID Mansy, Jan A, MD   500 mg at 09/20/24 9048   levothyroxine  (SYNTHROID ) tablet 125 mcg  125 mcg Oral Q0600 Mansy, Jan A, MD   125 mcg at 09/20/24 0505   lidocaine  (LIDODERM ) 5 % 1 patch  1 patch Transdermal Q24H Mansy, Jan A, MD       linagliptin  (TRADJENTA ) tablet 5 mg  5 mg Oral Daily Mansy, Jan A, MD   5 mg at 09/20/24 9047   magnesium  hydroxide (MILK OF MAGNESIA) suspension 30 mL  30 mL Oral Daily PRN Mansy, Jan A, MD       methylPREDNISolone  sodium  succinate (SOLU-MEDROL ) 125 mg/2 mL injection 60 mg  60 mg Intravenous Q24H Aleskerov, Fuad, MD   60 mg at 09/20/24 1015   metoprolol  succinate (TOPROL -XL) 24 hr tablet 50 mg  50 mg Oral Daily Mansy, Jan A, MD   50 mg at 09/20/24 9048   midodrine  (PROAMATINE ) tablet 10 mg  10 mg Oral TID PRN Ponnala, Shruthi, MD       multivitamin with minerals tablet 1 tablet  1 tablet Oral Daily Mansy, Jan A, MD   1 tablet at 09/20/24 9047   nutrition supplement (JUVEN) (JUVEN) powder packet 1 packet  1 packet Oral BID BM Mansy, Jan A, MD   1 packet at 09/20/24 1006   nystatin  (MYCOSTATIN /NYSTOP ) topical powder 1 Application  1 Application Topical BID Mansy, Jan A, MD   1 Application at 09/20/24 1005   ondansetron  (ZOFRAN ) tablet 4 mg  4 mg Oral Q6H PRN Mansy, Jan A, MD       Or   ondansetron  (ZOFRAN ) injection 4 mg  4 mg Intravenous Q6H PRN Mansy, Jan A, MD       pantoprazole  (PROTONIX ) EC tablet 40 mg  40 mg Oral BID AC Mansy, Jan A, MD   40 mg at 09/20/24 0950   polyethylene glycol (MIRALAX  / GLYCOLAX ) packet 17 g  17 g Oral Daily Mansy, Jan A, MD   17 g at 09/20/24 0954   pregabalin  (LYRICA ) capsule 50 mg  50 mg Oral BID Mansy, Jan A, MD   50 mg at 09/20/24 0951   rOPINIRole  (REQUIP  XL) 24 hr tablet 4 mg  4 mg Oral QHS Mansy, Jan A, MD   4 mg at 09/19/24 2218   senna (SENOKOT) tablet 8.6 mg  1 tablet Oral BID Mansy, Jan A, MD   8.6 mg at 09/20/24 0951   sertraline  (ZOLOFT ) tablet 100 mg  100 mg Oral Daily Ponnala, Shruthi, MD   100 mg at 09/20/24 1003   sodium chloride  HYPERTONIC 3 % nebulizer solution 4 mL  4 mL Nebulization BID Jerelene Critchley, MD   4  mL at 09/20/24 0744   spironolactone (ALDACTONE) tablet 25 mg  25 mg Oral Daily Ponnala, Shruthi, MD   25 mg at 09/20/24 0951   traMADol  (ULTRAM ) tablet 50 mg  50 mg Oral Q6H PRN Mansy, Jan A, MD   50 mg at 09/19/24 0426   traZODone (DESYREL) tablet 25 mg  25 mg Oral QHS PRN Mansy, Jan A, MD       zinc oxide 20 % ointment 1 Application  1 Application Topical  BID Mansy, Madison LABOR, MD   1 Application at 09/20/24 1006   Facility-Administered Medications Ordered in Other Encounters  Medication Dose Route Frequency Provider Last Rate Last Admin   oxyCODONE  (Oxy IR/ROXICODONE ) immediate release tablet 5 mg  5 mg Oral Once Agrawal, Kavita, MD         Discharge Medications: Please see discharge summary for a list of discharge medications.  Relevant Imaging Results:  Relevant Lab Results:   Additional Information SS#: [id number]  Lauraine JAYSON Carpen, LCSW

## 2024-09-21 DIAGNOSIS — A419 Sepsis, unspecified organism: Secondary | ICD-10-CM | POA: Diagnosis not present

## 2024-09-21 DIAGNOSIS — J441 Chronic obstructive pulmonary disease with (acute) exacerbation: Secondary | ICD-10-CM | POA: Diagnosis not present

## 2024-09-21 DIAGNOSIS — J189 Pneumonia, unspecified organism: Secondary | ICD-10-CM | POA: Diagnosis not present

## 2024-09-21 DIAGNOSIS — J9621 Acute and chronic respiratory failure with hypoxia: Secondary | ICD-10-CM | POA: Diagnosis not present

## 2024-09-21 LAB — GLUCOSE, CAPILLARY
Glucose-Capillary: 462 mg/dL — ABNORMAL HIGH (ref 70–99)
Glucose-Capillary: 29 mg/dL — CL (ref 70–99)
Glucose-Capillary: 33 mg/dL — CL (ref 70–99)
Glucose-Capillary: 78 mg/dL (ref 70–99)
Glucose-Capillary: 122 mg/dL — ABNORMAL HIGH (ref 70–99)
Glucose-Capillary: 158 mg/dL — ABNORMAL HIGH (ref 70–99)
Glucose-Capillary: 265 mg/dL — ABNORMAL HIGH (ref 70–99)

## 2024-09-21 LAB — CBC
HCT: 27.8 % — ABNORMAL LOW (ref 36.0–46.0)
Hemoglobin: 8.4 g/dL — ABNORMAL LOW (ref 12.0–15.0)
MCH: 25.7 pg — ABNORMAL LOW (ref 26.0–34.0)
MCHC: 30.2 g/dL (ref 30.0–36.0)
MCV: 85 fL (ref 80.0–100.0)
Platelets: 161 K/uL (ref 150–400)
RBC: 3.27 MIL/uL — ABNORMAL LOW (ref 3.87–5.11)
RDW: 16.3 % — ABNORMAL HIGH (ref 11.5–15.5)
WBC: 4.9 K/uL (ref 4.0–10.5)
nRBC: 0 % (ref 0.0–0.2)

## 2024-09-21 LAB — BASIC METABOLIC PANEL WITH GFR
Anion gap: 5 (ref 5–15)
BUN: 22 mg/dL (ref 8–23)
CO2: 42 mmol/L — ABNORMAL HIGH (ref 22–32)
Calcium: 10.7 mg/dL — ABNORMAL HIGH (ref 8.9–10.3)
Chloride: 96 mmol/L — ABNORMAL LOW (ref 98–111)
Creatinine, Ser: 0.59 mg/dL (ref 0.44–1.00)
GFR, Estimated: >60 mL/min (ref >60)
Glucose, Bld: 114 mg/dL — ABNORMAL HIGH (ref 70–99)
Potassium: 4.3 mmol/L (ref 3.5–5.1)
Sodium: 143 mmol/L (ref 135–145)

## 2024-09-21 MED ORDER — IPRATROPIUM-ALBUTEROL 0.5-2.5 (3) MG/3ML IN SOLN
3.0000 mL | Freq: Two times a day (BID) | RESPIRATORY_TRACT | Status: DC
  Administered 2024-09-21: 3 mL via RESPIRATORY_TRACT
  Filled 2024-09-21 (×2): qty 3

## 2024-09-21 NOTE — Progress Notes (Signed)
 PULMONOLOGY         Date: 09/21/2024,   MRN# [id number] Michele House 26-Dec-1955     AdmissionWeight: 70.2 kg                 CurrentWeight: 70.2 kg  Referring provider: Acute on chr   CHIEF COMPLAINT:   COPD with complete atelectasis of left lower lobe  HISTORY OF PRESENT ILLNESS   This is a pleasant 68 year old female with history of diabetes morbid obesity CVA advanced systolic CHF and COPD with chronic hypoxemia essential hypertension dyslipidemia and thyroid  dysfunction.  Diagnosis of non-small cell lung cancer of the right upper lobe and February 2025.  Patient reports being in her usual state of health comes in from [facility] with worsening dyspnea and hypoxemia she required 8 L/min supplemental oxygen in the field by EMS.  Reports fatigue and progressive hypoxemia.  She had chest imaging performed in the emergency department showing previously noted right upper lobe lung mass as well as left lung atelectasis.  She was admitted to hospitalist service with empiric therapy for community-acquired pneumonia receiving IV Rocephin  and IV Zithromax  as well as 2.5 L of IV normal saline resuscitation.  During my interview she reports having normal bowel pattern without diarrhea, able to eat with assistance.  Denies have any fevers or sick contacts.  PCCM consultation placed for additional management evaluation of complete atelectasis of left lower lobe.   09/19/24- patient seen at bedside. She received hypertonic saline and mucomyst overnight with minimal expectoration of phlegm.  She has a weak cough and overall quite deconditioned due to active lung cancer of right upper lobe.  Her CBC is stable without leukocytosis.  Procalcitonin is mildly elevated.  Viral workup is negative thus far.  Blood cultures are too young to read.  Will continue empiric Therapy and bronchopulmonary hygiene with Mucomyst and hypertonic saline and perform bronchoscopy within the next few  days.  09/20/24- patient seen at bedside.  She still struggles to bring up phlegm/mucus with significant dyspnea and hypoxemia currenlty on 9L/min.  We plan to take her for bronchoscopy with therpeutic aspiration of tracheobronchial tree on Friday morning 730am to get her airways cleared and evaluate for any new malignant endobronchial lesion. She is in no distress but is dyspneic at rest.    09/21/24- patient seen at bedside. She's having nebulizer treatment. She reports she wants fulls cope of care still.  She discussed palliative visit with me and states she does not wish to modify her care from full scope of care.   PAST MEDICAL HISTORY   Past Medical History:  Diagnosis Date   Acid reflux    COPD (chronic obstructive pulmonary disease) (HCC)    Diabetes mellitus without complication (HCC)    Hyperlipidemia    Hypertension    Stroke (HCC)    Thyroid  disease      SURGICAL HISTORY   Past Surgical History:  Procedure Laterality Date   appendectomy     APPENDECTOMY     BREAST BIOPSY Right    CORE W/CLIP - NEG   ESOPHAGOGASTRODUODENOSCOPY N/A 10/28/2023   Procedure: ESOPHAGOGASTRODUODENOSCOPY (EGD);  Surgeon: Jinny Carmine, MD;  [facility];  Service: Endoscopy;  Laterality: N/A;   FLEXIBLE BRONCHOSCOPY Bilateral 11/12/2023   Procedure: FLEXIBLE BRONCHOSCOPY;  Surgeon: Parris Manna, MD;  Location: [facility] ORS;  Service: Thoracic;  Laterality: Bilateral;   IR EMBO ART  VEN HEMORR LYMPH EXTRAV  INC GUIDE ROADMAPPING  10/30/2023   IR  IMAGING GUIDED PORT INSERTION  01/18/2024   IVC FILTER INSERTION N/A 11/09/2023   Procedure: IVC FILTER INSERTION;  Surgeon: Marea Selinda RAMAN, MD;  Location: [facility] INVASIVE CV LAB;  Service: Cardiovascular;  Laterality: N/A;   TOTAL VAGINAL HYSTERECTOMY     TUMOR REMOVAL     benign;stomach   VIDEO BRONCHOSCOPY WITH ENDOBRONCHIAL NAVIGATION Right 10/20/2023   Procedure: VIDEO BRONCHOSCOPY WITH ENDOBRONCHIAL NAVIGATION;  Surgeon: Parris Manna,  MD;  Location: [facility] ORS;  Service: Thoracic;  Laterality: Right;   VIDEO BRONCHOSCOPY WITH ENDOBRONCHIAL NAVIGATION Right 11/12/2023   Procedure: VIDEO BRONCHOSCOPY WITH ENDOBRONCHIAL NAVIGATION;  Surgeon: Parris Manna, MD;  Location: [facility] ORS;  Service: Thoracic;  Laterality: Right;     FAMILY HISTORY   Family History  Problem Relation Age of Onset   Heart attack Mother    Hypertension Mother    Breast cancer Sister 72   Breast cancer Maternal Aunt        40'S   Breast cancer Maternal Grandmother      SOCIAL HISTORY   Social History   Tobacco Use   Smoking status: Former    Current packs/day: 0.00    Types: Cigarettes    Quit date: 12/15/1985    Years since quitting: 38.7   Smokeless tobacco: Never  Substance Use Topics   Alcohol use: No   Drug use: No     MEDICATIONS    Home Medication:     Current Medication:  Current Facility-Administered Medications:    (feeding supplement) PROSource Plus liquid 30 mL, 30 mL, Oral, BID BM, Mansy, Jan A, MD, 30 mL at 09/20/24 1006   acetaminophen  (TYLENOL ) tablet 650 mg, 650 mg, Oral, Q6H PRN **OR** acetaminophen  (TYLENOL ) suppository 650 mg, 650 mg, Rectal, Q6H PRN, Ponnala, Shruthi, MD   acetylcysteine (MUCOMYST) 20 % nebulizer / oral solution 4 mL, 4 mL, Nebulization, BID, Qianna Clagett, MD, 4 mL at 09/21/24 0731   alum & mag hydroxide-simeth (MAALOX/MYLANTA) 200-200-20 MG/5ML suspension 20 mL, 20 mL, Oral, Q6H PRN, Mansy, Jan A, MD   aspirin  EC tablet 81 mg, 81 mg, Oral, Daily, Mansy, Jan A, MD, 81 mg at 09/20/24 9048   atorvastatin  (LIPITOR) tablet 20 mg, 20 mg, Oral, Daily, Mansy, Jan A, MD, 20 mg at 09/20/24 9048   azithromycin  (ZITHROMAX ) tablet 500 mg, 500 mg, Oral, QHS, Clair Marolyn NOVAK, RPH, 500 mg at 09/20/24 2202   busPIRone (BUSPAR) tablet 20 mg, 20 mg, Oral, BID, Mansy, Jan A, MD, 20 mg at 09/20/24 2202   calcium  carbonate (TUMS - dosed in mg elemental calcium ) chewable tablet 200 mg of elemental calcium , 1  tablet, Oral, Daily, Mansy, Jan A, MD, 200 mg of elemental calcium  at 09/20/24 0951   cefUROXime (CEFTIN) tablet 500 mg, 500 mg, Oral, BID WC, Carrin Vannostrand, MD, 500 mg at 09/20/24 1733   chlorpheniramine-HYDROcodone  (TUSSIONEX) 10-8 MG/5ML suspension 5 mL, 5 mL, Oral, Q12H PRN, Mansy, Jan A, MD   collagenase (SANTYL) ointment 1 Application, 1 Application, Topical, Daily, Mansy, Jan A, MD, 1 Application at 09/20/24 1006   enoxaparin  (LOVENOX ) injection 40 mg, 40 mg, Subcutaneous, Q24H, Mansy, Jan A, MD, 40 mg at 09/20/24 0955   feeding supplement (GLUCERNA SHAKE) (GLUCERNA SHAKE) liquid 237 mL, 237 mL, Oral, BID BM, Mansy, Jan A, MD, 237 mL at 09/20/24 1738   ferrous sulfate tablet 325 mg, 325 mg, Oral, BID WC, Mansy, Jan A, MD, 325 mg at 09/20/24 1733   furosemide  (LASIX ) tablet 20 mg, 20 mg, Oral, Daily, Ponnala,  Shruthi, MD, 20 mg at 09/20/24 9048   insulin  aspart (novoLOG ) injection 0-20 Units, 0-20 Units, Subcutaneous, TID WC, Mansy, Jan A, MD, 15 Units at 09/20/24 1733   insulin  aspart (novoLOG ) injection 0-5 Units, 0-5 Units, Subcutaneous, QHS, Mansy, Jan A, MD, 2 Units at 09/20/24 2203   insulin  aspart (novoLOG ) injection 4 Units, 4 Units, Subcutaneous, TID WC, Mansy, Jan A, MD, 4 Units at 09/20/24 1734   insulin  glargine-yfgn (SEMGLEE ) injection 18 Units, 18 Units, Subcutaneous, Daily, Mansy, Jan A, MD, 18 Units at 09/20/24 1005   ipratropium-albuterol  (DUONEB) 0.5-2.5 (3) MG/3ML nebulizer solution 3 mL, 3 mL, Nebulization, Q4H PRN, Dail Rankin RAMAN, RPH, 3 mL at 09/18/24 0041   ipratropium-albuterol  (DUONEB) 0.5-2.5 (3) MG/3ML nebulizer solution 3 mL, 3 mL, Nebulization, BID, Maree Hue, MD   levETIRAcetam  (KEPPRA ) tablet 500 mg, 500 mg, Oral, BID, Mansy, Jan A, MD, 500 mg at 09/20/24 2202   levothyroxine  (SYNTHROID ) tablet 125 mcg, 125 mcg, Oral, Q0600, Mansy, Jan A, MD, 125 mcg at 09/21/24 0512   lidocaine  (LIDODERM ) 5 % 1 patch, 1 patch, Transdermal, Q24H, Mansy, Jan A, MD    linagliptin  (TRADJENTA ) tablet 5 mg, 5 mg, Oral, Daily, Mansy, Jan A, MD, 5 mg at 09/20/24 9047   magnesium  hydroxide (MILK OF MAGNESIA) suspension 30 mL, 30 mL, Oral, Daily PRN, Mansy, Jan A, MD   methylPREDNISolone  sodium succinate (SOLU-MEDROL ) 125 mg/2 mL injection 60 mg, 60 mg, Intravenous, Q24H, Devynn Hessler, MD, 60 mg at 09/20/24 1015   metoprolol  succinate (TOPROL -XL) 24 hr tablet 50 mg, 50 mg, Oral, Daily, Mansy, Jan A, MD, 50 mg at 09/20/24 9048   midodrine  (PROAMATINE ) tablet 10 mg, 10 mg, Oral, TID PRN, Ponnala, Shruthi, MD   multivitamin with minerals tablet 1 tablet, 1 tablet, Oral, Daily, Mansy, Jan A, MD, 1 tablet at 09/20/24 9047   nutrition supplement (JUVEN) (JUVEN) powder packet 1 packet, 1 packet, Oral, BID BM, Mansy, Jan A, MD, 1 packet at 09/20/24 1006   nystatin  (MYCOSTATIN /NYSTOP ) topical powder 1 Application, 1 Application, Topical, BID, Mansy, Jan A, MD, 1 Application at 09/20/24 2226   ondansetron  (ZOFRAN ) tablet 4 mg, 4 mg, Oral, Q6H PRN **OR** ondansetron  (ZOFRAN ) injection 4 mg, 4 mg, Intravenous, Q6H PRN, Mansy, Jan A, MD   pantoprazole  (PROTONIX ) EC tablet 40 mg, 40 mg, Oral, BID AC, Mansy, Jan A, MD, 40 mg at 09/20/24 1733   polyethylene glycol (MIRALAX  / GLYCOLAX ) packet 17 g, 17 g, Oral, Daily, Mansy, Jan A, MD, 17 g at 09/20/24 9045   pregabalin  (LYRICA ) capsule 50 mg, 50 mg, Oral, BID, Mansy, Jan A, MD, 50 mg at 09/20/24 2202   rOPINIRole  (REQUIP  XL) 24 hr tablet 4 mg, 4 mg, Oral, QHS, Mansy, Jan A, MD, 4 mg at 09/20/24 2225   senna (SENOKOT) tablet 8.6 mg, 1 tablet, Oral, BID, Mansy, Jan A, MD, 8.6 mg at 09/20/24 2202   sertraline  (ZOLOFT ) tablet 100 mg, 100 mg, Oral, Daily, Ponnala, Shruthi, MD, 100 mg at 09/20/24 1003   spironolactone  (ALDACTONE ) tablet 25 mg, 25 mg, Oral, Daily, Ponnala, Shruthi, MD, 25 mg at 09/20/24 9048   traZODone  (DESYREL ) tablet 25 mg, 25 mg, Oral, QHS PRN, Mansy, Jan A, MD   zinc  oxide 20 % ointment 1 Application, 1 Application,  Topical, BID, Mansy, Madison LABOR, MD, 1 Application at 09/20/24 2226  Facility-Administered Medications Ordered in Other Encounters:    oxyCODONE  (Oxy IR/ROXICODONE ) immediate release tablet 5 mg, 5 mg, Oral, Once, Agrawal, Kavita, MD  ALLERGIES   Penicillins, Aspirin , and Sulfa antibiotics     REVIEW OF SYSTEMS    Review of Systems:  Gen:  Denies  fever, sweats, chills weigh loss  HEENT: Denies blurred vision, double vision, ear pain, eye pain, hearing loss, nose bleeds, sore throat Cardiac:  No dizziness, chest pain or heaviness, chest tightness,edema Resp:   reports dyspnea chronically  Gi: Denies swallowing difficulty, stomach pain, nausea or vomiting, diarrhea, constipation, bowel incontinence Gu:  Denies bladder incontinence, burning urine Ext:   Denies Joint pain, stiffness or swelling Skin: Denies  skin rash, easy bruising or bleeding or hives Endoc:  Denies polyuria, polydipsia , polyphagia or weight change Psych:   Denies depression, insomnia or hallucinations   Other:  All other systems negative   VS: BP 115/60 (BP Location: Left Arm)   Pulse 74   Temp 97.7 F (36.5 C) (Oral)   Resp 16   Ht 5' 1 (1.549 m)   Wt 70.2 kg   LMP 12/24/1998 Comment: prior to her hysterectomy  SpO2 100%   BMI 29.24 kg/m      PHYSICAL EXAM    GENERAL:NAD, no fevers, chills, no weakness no fatigue HEAD: Normocephalic, atraumatic.  EYES: Pupils equal, round, reactive to light. Extraocular muscles intact. No scleral icterus.  MOUTH: Moist mucosal membrane. Dentition intact. No abscess noted.  EAR, NOSE, THROAT: Clear without exudates. No external lesions.  NECK: Supple. No thyromegaly. No nodules. No JVD.  PULMONARY: decreased breath sounds with mild rhonchi worse at bases bilaterally.  CARDIOVASCULAR: S1 and S2. Regular rate and rhythm. No murmurs, rubs, or gallops. No edema. Pedal pulses 2+ bilaterally.  GASTROINTESTINAL: Soft, nontender, nondistended. No masses. Positive  bowel sounds. No hepatosplenomegaly.  MUSCULOSKELETAL: No swelling, clubbing, or edema. Range of motion full in all extremities.  NEUROLOGIC: Cranial nerves II through XII are intact. No gross focal neurological deficits. Sensation intact. Reflexes intact.  SKIN: No ulceration, lesions, rashes, or cyanosis. Skin warm and dry. Turgor intact.  PSYCHIATRIC: Mood, affect within normal limits. The patient is awake, alert and oriented x 3. Insight, judgment intact.       IMAGING     Narrative & Impression  EXAM: CTA CHEST 09/17/2024 10:07:39 PM   TECHNIQUE: CTA of the chest was performed after the administration of intravenous contrast. Multiplanar reformatted images are provided for review. MIP images are provided for review. Automated exposure control, iterative reconstruction, and/or weight based adjustment of the mA/kV was utilized to reduce the radiation dose to as low as reasonably achievable.   COMPARISON: None available.   CLINICAL HISTORY: Lung cancer patient with right upper lobe neoplasm presenting with hypoxemia and respiratory distress. Pulmonary embolism (PE) suspected, high prob.   FINDINGS:   PULMONARY ARTERIES: Pulmonary arteries are adequately opacified for evaluation. The pulmonary arteries are slightly prominent but no arterial embolus is seen.   MEDIASTINUM: There is mild cardiomegaly with a slight right chamber predominance with RV/LV ratio of 1.06. There is no pericardial effusion. There is atherosclerosis in the aorta, great vessels, small amount in the left main and proximal LAD coronary artery. No aortic dissection or aneurysm.   The pulmonary veins are nondilated. There is mild chronic enlargement and heterogeneity of the thyroid  gland without a dominant nodule.   There is a chronically patulous esophagus without wall thickening. IVC filter in place. Left chest mediport with IJ approach catheter terminating in the SVC.   LYMPH NODES: Subcarinal  adenopathy is unchanged up to 1.2 cm on axial 58 of  series 6, with increased prominence of AP window and left hilar nodes up to 1.2 cm, increased prominence of right hilar nodes up to 1.5 cm on axial 67.   LUNGS AND PLEURA: Lungs are emphysematous. There is mild layering fluid in the distal trachea. There is layering fluid in the bilateral main bronchi, on the left extending into and obstructing the main lower lobe and lower lobe segmental bronchi, with consolidation throughout the left lower lobe consistent with a consolidated pneumonia.   There is a right upper lobe suprahilar mass encasing central bronchovascular structures with bronchial narrowing.   It is difficult to distinguish between the mass and adjacent pulmonary consolidation. In total, the mass and consolidation today measure 5 x 6.6 cm on axial 47 of series 6, not grossly changed from the PET CT but certainly significantly larger than on 05/24/2024. There is surrounding spiculation and stranding to pleural surfaces and areas of pleural thickening in the right chest superiorly and medially probably due to pleural metastatic disease and hypermetabolic on the PET CT.   There is a small layering right pleural effusion with partial loculation in the superior chest. In the right lower lobe, several coarsely nodular opacities are again noted, consistent with patchy airspace disease versus intraorgan metastases.   The largest of these is 2.3 x 1.9 cm on series 8 axial 82, unchanged.   Elsewhere, there is progressive diffuse interstitial thickening and micronodularity relatively sparing the right middle lobe and first seen on PET CT but worsened since.   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 No pneumothorax.   UPPER ABDOMEN: The gallbladder is dilated and there are tiny layering stones posteriorly without wall thickening.   There are stones  in the right renal collecting system and renal pelvis, largest is 6 mm just above the UPJ. There is mild right hydronephrosis.   The liver is steatotic. There is a 2 cm Bosniak 1 cyst in the right upper pole.   SOFT TISSUES AND BONES: There is osteopenia and kyphosis of the thoracic spine. No destructive bone lesions. No acute soft tissue abnormality.   IMPRESSION: 1. No evidence of pulmonary embolism. 2. Left lower lobe consolidation with mucus impaction and complete obstruction of the left lower lobe bronchus, consistent with pneumonia. 3. Cardiomegaly with right chamber predominance indicating right heart dysfunction. the pulmonary arteries are slightly prominent. 4. Fluid in the main bronchi, obstructing the left lower lobe main and segmental bronchi with complete consolidation/ atelectasis of the left lower lobe. 5. Right upper lobe suprahilar mass with bronchovascular encasement and bronchial narrowing, overall size approximately 5 x 6.6 cm, not grossly changed from recent PET CT but increased from 05/24/2024, with associated pleural thickening suspicious for pleural metastatic disease and small partially loculated right pleural effusion . 6. 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 7. Mediastinal and hilar adenopathy with interval increase in AP window and bilateral hilar nodes up to 1.5 cm and stable subcarinal node up to 1.2 cm. 8. Right nephrolithiasis including a 6 mm renal pelvis just above the UPJ with mild right hydronephrosis. 9. Emphysema, with aortic and coronary atherosclerosis.   Electronically signed by: Francis Quam MD 09/17/2024 10:53 PM EST RP Workstation: [id number]      Result History    ASSESSMENT/PLAN   Complete atelectasis of left lower lobe Patient with known advanced COPD and non-small cell lung cancer , Initially will attempt noninvasive modalities with Mucomyst  nebulized  solution 20% 4 mL twice daily - Incentive spirometry and flutter valve to be used by patient multiple times each hour  -Discussed care plan with RT today- -patient receiving hypertonic saline nebulized for bronchopulmonary hygiene -patient has received 2 treatments of Mucomyst  over the last 24 hours with minimal improvement we will consider bronchoscopy during this hospitalization   Community-acquired pneumonia of left lung Agree with IV Rocephin  and Zithromax  with a goal of 7 days total therapy     Advanced COPD with chronic hypoxemia Continue nebulizer therapy as needed -Solu-Medrol  is been reduced from 80 mg to 60 IV daily   Morbid obesity and physical deconditioning - Contributing to atelectasis, recommendation for PT OT as soon as able    Thank you for allowing me to participate in the care of this patient.   Patient/Family are satisfied with care plan and all questions have been answered.    Provider disclosure: Patient with at least one acute or chronic illness or injury that poses a threat to life or bodily function and is being managed actively during this encounter.  All of the below services have been performed independently by signing provider:  review of prior documentation from internal and or external health records.  Review of previous and current lab results.  Interview and comprehensive assessment during patient visit today. Review of current and previous chest radiographs/CT scans. Discussion of management and test interpretation with health care team and patient/family.   This document was prepared using Dragon voice recognition software and may include unintentional dictation errors.     Trinity Hyland, M.D.  Division of Pulmonary & Critical Care Medicine

## 2024-09-21 NOTE — TOC Progression Note (Signed)
 Transition of Care [facility]) - Progression Note    Patient Details  Name: Michele House MRN: [id number] Date of Birth: October 18, 1956  [facility] CM/SW Contact  Lauraine JAYSON Carpen, LCSW Phone Number: 09/21/2024, 8:51 AM  Clinical Narrative: CSW notified [facility] admissions that plan is for discharge tomorrow after bronch.    Expected Discharge Plan: Skilled Nursing Facility Barriers to Discharge: Continued Medical Work up               Expected Discharge Plan and Services     Post Acute Care Choice: Resumption of Svcs/PTA Provider Living arrangements for the past 2 months: Skilled Nursing Facility                                       Social Drivers of Health (SDOH) Interventions SDOH Screenings   Food Insecurity: Patient Unable To Answer (09/18/2024)  Housing: Unknown (09/18/2024)  Transportation Needs: Patient Unable To Answer (09/18/2024)  Utilities: Patient Unable To Answer (09/18/2024)  Depression (PHQ2-9): Low Risk  (08/11/2024)  Social Connections: Patient Unable To Answer (09/18/2024)  Recent Concern: Social Connections - Socially Isolated (09/01/2024)  Tobacco Use: Medium Risk (09/14/2024)   Received from [facility]    Readmission Risk Interventions     No data to display

## 2024-09-21 NOTE — Progress Notes (Signed)
 PROGRESS NOTE    Michele House  FMW:[id number] DOB: 20-Jun-1956 DOA: 09/17/2024 PCP: [facility]  Chief Complaint  Patient presents with   Abnormal Lab    SNF reported 58    Hospital Course:  Michele House is a 68 y.o. 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  On presentation also had worsening dyspnea, hypoxia.  Patient admitted for sepsis due to pneumonia, hospital course as below  11/19: Discontinue telemetry, transfer to any MedSurg 11/20: Blood sugar dropped in 30s and hypoglycemia protocol initiated  Subjective:  Hoping to get the bronchoscopy done tomorrow, some cough   Objective: Vitals:   09/20/24 2113 09/20/24 2147 09/21/24 0426 09/21/24 0807  BP: 121/65  115/60 113/84  Pulse: 76  74 75  Resp: 15  16 16   Temp: 97.8 F (36.6 C)  97.7 F (36.5 C) 98.2 F (36.8 C)  TempSrc: Oral  Oral   SpO2: 100% 100% 100% 96%  Weight:      Height:        Intake/Output Summary (Last 24 hours) at 09/21/2024 1410 Last data filed at 09/21/2024 1007 Gross per 24 hour  Intake 180 ml  Output 1500 ml  Net -1320 ml   Filed Weights   09/17/24 1846  Weight: 70.2 kg    Examination: GENERAL:  68 y.o.-year-old patient lying in the bed with no acute distress.  NECK:  Supple, no jugular venous distention. No thyroid  enlargement, no tenderness.  LUNGS: Diminished bibasilar breath sounds at bases.  CARDIOVASCULAR: Regular rate and rhythm, S1, S2 normal. No murmurs, rubs, or gallops.  ABDOMEN: Soft, benign EXTREMITIES: No pedal edema, cyanosis, or clubbing.  NEUROLOGIC: Cranial nerves II through XII are intact. Muscle strength 5/5 in all extremities.  Sensation intact.  PSYCHIATRIC: Awake, alert  Assessment & Plan:  Sepsis due to pneumonia Complete atelectasis left lower lobe Non-small cell lung cancer - LA normal, No leukocytosis - Flu/COVID/RSV negative, UA with squamous cells (likely contaminant) - CTA LLL consolidation with mucus impaction and complete obstruction of LLL bronchus.  Fluid in the main bronchi, complete consolidation/atelectasis of left lower lobe, Cardiomegaly with right chamber prominence indicating right heart dysfunction. RUL suprahilar mass bronchovascular encasement and bronchial narrowing, size approximately 5 into 6.6 cm, not grossly changed -with associated pleural thickening suspicious for pleural metastatic disease. Progressive diffuse interstitial thickening and micronodularity, consistent with lymphangitic carcinomatosis. Mediastinal and hilar adenopathy - Continue oral azithromycin  and Ceftin - Mucomyst nebulized solution, hypertonic saline nebs - Pulmonary planning bronchoscopy this Friday on 11/21   COPD with acute exacerbation - Continue IV Solu-Medrol , DuoNebs.  On azithromycin  and Ceftin  Acute on chronic respiratory failure with hypoxia and hypercapnia - Likely secondary to pneumonia and COPD exacerbation - Wean O2 as able.  She uses 3 L oxygen at baseline.  Currently on the same  Mild hyperkalemia - resolved - s/p Lokelma  5g  Mild right hydronephrosis - Imaging shows Right nephrolithiasis including a 6 mm renal pelvis just above the UPJ with mild right hydronephrosis - If new/ worsening right flank pain -recommend CT stone protocol, otherwise can follow-up outpatient - Seen by urology, appreciate recs  Chronic HFrEF (heart failure with reduced ejection fraction) - S/p IV  fluids on admission, 1 dose IV Lasix  today.  Resume home Lasix , spironolactone  - Continue metoprolol , Tradjenta   Dementia - Supportive care   History of right leg DVT, status post IVC filter - Happened January of this  year, IVC filter placed instead of anticoagulation due to concurrent severe GI bleed.     History of duodenal ulcer and upper GI bleed  - During same hospitalization in January, patient also developed severe duodenum bleeding, subsequently underwent EGD and clipping but clipping failed and patient received IR embolization 2 days later. - Monito Hb   Seizure disorder - Continue Keppra    IDDM with episode of hypoglycemia today - Continue Lantus  18units, Novolog  4u TID, SSI - Tapering steroids - Her blood sugar dropped to 33, 28. Hypoglycemia protocol initiated and sugar improving. -- Palliative consult following -- Likely return back to long-term care after bronch tomorrow  DVT prophylaxis: Lovenox  SQ   Code Status: Full Code Disposition:  TBD  Consultants:  Treatment Team:  Consulting Physician: Parris Manna, MD Consulting Physician: Geralynn Charleston, MD Consulting Physician: Georganne Penne SAUNDERS, MDPulmonary Urology  Procedures:  Bronch  Antimicrobials:  Anti-infectives (From admission, onward)    Start     Dose/Rate Route Frequency Ordered Stop   09/20/24 2200  azithromycin  (ZITHROMAX ) tablet 500 mg        500 mg Oral Daily at bedtime 09/20/24 1407 09/22/24 2159   09/20/24 1700  cefUROXime  (CEFTIN ) tablet 500 mg        500 mg Oral 2 times daily with meals 09/20/24 1446 09/24/24 1659   09/18/24 2000  cefTRIAXone  (ROCEPHIN ) 2 g in sodium chloride  0.9 % 100 mL IVPB  Status:  Discontinued        2 g 200 mL/hr over 30 Minutes Intravenous Every 24 hours 09/17/24 2337 09/20/24 1445   09/18/24 2000  azithromycin  (ZITHROMAX ) 500 mg in sodium chloride  0.9 % 250 mL IVPB  Status:  Discontinued        500 mg 250 mL/hr over 60 Minutes Intravenous Every 24 hours 09/17/24 2337 09/20/24 1407   09/17/24 1915  cefTRIAXone  (ROCEPHIN ) 2 g in sodium chloride  0.9 % 100 mL IVPB        2 g 200 mL/hr over 30 Minutes Intravenous Once 09/17/24 1909 09/17/24 1954   09/17/24 1915  azithromycin   (ZITHROMAX ) 500 mg in sodium chloride  0.9 % 250 mL IVPB        500 mg 250 mL/hr over 60 Minutes Intravenous  Once 09/17/24 1909 09/17/24 2114       Data Reviewed: I have personally reviewed following labs and imaging studies CBC: Recent Labs  Lab 09/17/24 1855 09/18/24 0830 09/19/24 0518 09/20/24 0533 09/21/24 0505  WBC 6.2 6.7 6.2 3.6* 4.9  NEUTROABS 4.9  --   --   --   --   HGB 10.0* 9.5* 9.2* 9.1* 8.4*  HCT 33.5* 32.0* 30.7* 30.1* 27.8*  MCV 86.8 87.9 84.6 84.1 85.0  PLT 210 164 223 158 161   Basic Metabolic Panel: Recent Labs  Lab 09/17/24 1855 09/18/24 0830 09/19/24 0518 09/20/24 0533 09/21/24 0505  NA 143 142 140 144 143  K 4.7 5.2* 4.3 4.2 4.3  CL 97* 102 99 99 96*  CO2 38* 33* 35* 40* 42*  GLUCOSE 114* 205* 143* 166* 114*  BUN 28* 32* 36* 27* 22  CREATININE 0.74 0.77 0.78 0.70 0.59  CALCIUM  11.7* 10.6* 11.4* 11.4* 10.7*   GFR: Estimated Creatinine Clearance: 58.7 mL/min (by C-G formula based on SCr  of 0.59 mg/dL). Liver Function Tests: Recent Labs  Lab 09/17/24 1855  AST 34  ALT 24  ALKPHOS 83  BILITOT 0.3  PROT 5.8*  ALBUMIN 3.5   CBG: Recent Labs  Lab 09/21/24 0840 09/21/24 1158 09/21/24 1200 09/21/24 1220 09/21/24 1322  GLUCAP 462* 33* 29* 78 122*    Recent Results (from the past 240 hours)  Blood Culture (routine x 2)     Status: None (Preliminary result)   Collection Time: 09/17/24  6:55 PM   Specimen: BLOOD  Result Value Ref Range Status   Specimen Description BLOOD LEFT ANTECUBITAL  Final   Special Requests   Final    BOTTLES DRAWN AEROBIC AND ANAEROBIC Blood Culture results may not be optimal due to an inadequate volume of blood received in culture bottles   Culture   Final    NO GROWTH 4 DAYS Performed at [facility], [address], [place], [place] [address]    Report Status PENDING  Incomplete  Resp panel by RT-PCR (RSV, Flu A&B, Covid) Anterior Nasal Swab     Status: None   Collection Time: 09/17/24  7:10 PM    Specimen: Anterior Nasal Swab  Result Value Ref Range Status   SARS Coronavirus 2 by RT PCR NEGATIVE NEGATIVE Final    Comment: (NOTE) SARS-CoV-2 target nucleic acids are NOT DETECTED.  The SARS-CoV-2 RNA is generally detectable in upper respiratory specimens during the acute phase of infection. The lowest concentration of SARS-CoV-2 viral copies this assay can detect is 138 copies/mL. A negative result does not preclude SARS-Cov-2 infection and should not be used as the sole basis for treatment or other patient management decisions. A negative result may occur with  improper specimen collection/handling, submission of specimen other than nasopharyngeal swab, presence of viral mutation(s) within the areas targeted by this assay, and inadequate number of viral copies(<138 copies/mL). A negative result must be combined with clinical observations, patient history, and epidemiological information. The expected result is Negative.  Fact Sheet for Patients:  bloggercourse.com  Fact Sheet for Healthcare Providers:  seriousbroker.it  This test is no t yet approved or cleared by the [place]  FDA and  has been authorized for detection and/or diagnosis of SARS-CoV-2 by FDA under an Emergency Use Authorization (EUA). This EUA will remain  in effect (meaning this test can be used) for the duration of the COVID-19 declaration under Section 564(b)(1) of the Act, 21 U.S.C.section 360bbb-3(b)(1), unless the authorization is terminated  or revoked sooner.       Influenza A by PCR NEGATIVE NEGATIVE Final   Influenza B by PCR NEGATIVE NEGATIVE Final    Comment: (NOTE) The Xpert Xpress SARS-CoV-2/FLU/RSV plus assay is intended as an aid in the diagnosis of influenza from Nasopharyngeal swab specimens and should not be used as a sole basis for treatment. Nasal washings and aspirates are unacceptable for Xpert Xpress  SARS-CoV-2/FLU/RSV testing.  Fact Sheet for Patients: bloggercourse.com  Fact Sheet for Healthcare Providers: seriousbroker.it  This test is not yet approved or cleared by the [place]  FDA and has been authorized for detection and/or diagnosis of SARS-CoV-2 by FDA under an Emergency Use Authorization (EUA). This EUA will remain in effect (meaning this test can be used) for the duration of the COVID-19 declaration under Section 564(b)(1) of the Act, 21 U.S.C. section 360bbb-3(b)(1), unless the authorization is terminated or revoked.     Resp Syncytial Virus by PCR NEGATIVE NEGATIVE Final    Comment: (NOTE) Fact Sheet for  Patients: bloggercourse.com  Fact Sheet for Healthcare Providers: seriousbroker.it  This test is not yet approved or cleared by the [place]  FDA and has been authorized for detection and/or diagnosis of SARS-CoV-2 by FDA under an Emergency Use Authorization (EUA). This EUA will remain in effect (meaning this test can be used) for the duration of the COVID-19 declaration under Section 564(b)(1) of the Act, 21 U.S.C. section 360bbb-3(b)(1), unless the authorization is terminated or revoked.  Performed at [facility], [address] [place], [place] [address]   Blood Culture (routine x 2)     Status: None (Preliminary result)   Collection Time: 09/17/24  8:44 PM   Specimen: BLOOD  Result Value Ref Range Status   Specimen Description BLOOD BLOOD RIGHT HAND  Final   Special Requests   Final    BOTTLES DRAWN AEROBIC AND ANAEROBIC Blood Culture adequate volume   Culture   Final    NO GROWTH 4 DAYS Performed at [facility], [address] [place], [place] [address]    Report Status PENDING  Incomplete     Radiology Studies: No results found.   Scheduled Meds:  (feeding supplement) PROSource Plus  30 mL Oral BID BM    acetylcysteine   4 mL Nebulization BID   aspirin  EC  81 mg Oral Daily   atorvastatin   20 mg Oral Daily   azithromycin   500 mg Oral QHS   busPIRone   20 mg Oral BID   calcium  carbonate  1 tablet Oral Daily   cefUROXime   500 mg Oral BID WC   collagenase   1 Application Topical Daily   enoxaparin  (LOVENOX ) injection  40 mg Subcutaneous Q24H   feeding supplement (GLUCERNA SHAKE)  237 mL Oral BID BM   ferrous sulfate   325 mg Oral BID WC   furosemide   20 mg Oral Daily   insulin  aspart  0-20 Units Subcutaneous TID WC   insulin  aspart  0-5 Units Subcutaneous QHS   insulin  aspart  4 Units Subcutaneous TID WC   insulin  glargine-yfgn  18 Units Subcutaneous Daily   ipratropium-albuterol   3 mL Nebulization BID   levETIRAcetam   500 mg Oral BID   levothyroxine   125 mcg Oral Q0600   lidocaine   1 patch Transdermal Q24H   linagliptin   5 mg Oral Daily   methylPREDNISolone  (SOLU-MEDROL ) injection  60 mg Intravenous Q24H   metoprolol  succinate  50 mg Oral Daily   multivitamin with minerals  1 tablet Oral Daily   nutrition supplement (JUVEN)  1 packet Oral BID BM   nystatin   1 Application Topical BID   pantoprazole   40 mg Oral BID AC   polyethylene glycol  17 g Oral Daily   pregabalin   50 mg Oral BID   rOPINIRole   4 mg Oral QHS   senna  1 tablet Oral BID   sertraline   100 mg Oral Daily   spironolactone   25 mg Oral Daily   zinc  oxide  1 Application Topical BID   Continuous Infusions:  Time spent 35 minutes   LOS: 4 days  MDM: Patient is high risk for one or more organ failure.  They necessitate ongoing hospitalization for continued IV therapies and subsequent lab monitoring. Total time spent interpreting labs and vitals, reviewing the medical record, coordinating care amongst consultants and care team members, directly assessing and discussing care with the patient and/or family: 55 min Manali Mcelmurry Maree, MD [facility]  To contact the attending physician between 7A-7P please use Epic Chat. To  contact  the covering physician during after hours 7P-7A, please review Amion.  09/21/2024, 2:10 PM   *This document has been created with the assistance of dictation software. Please excuse typographical errors. *

## 2024-09-21 NOTE — Inpatient Diabetes Management (Signed)
 Inpatient Diabetes Program Recommendations  AACE/ADA: New Consensus Statement on Inpatient Glycemic Control (2015)  Target Ranges:  Prepandial:   less than 140 mg/dL      Peak postprandial:   less than 180 mg/dL (1-2 hours)      Critically ill patients:  140 - 180 mg/dL   Lab Results  Component Value Date   GLUCAP 462 (H) 09/21/2024   HGBA1C 5.8 (H) 09/01/2024    Review of Glycemic Control  Latest Reference Range & Units 09/20/24 08:05 09/20/24 12:18 09/20/24 16:21 09/20/24 21:19 09/21/24 08:40  Glucose-Capillary 70 - 99 mg/dL 864 (H) 759 (H) 675 (H) 204 (H) 462 (H)  (H): Data is abnormally high  Diabetes history: DM2 Outpatient Diabetes medications:  Semglee  18 units every day Novolog  4 units TID Glipizide  5 mg every day Tradjenta  5 mg QD Current orders for Inpatient glycemic control:  Semglee  18 units every day Novolog  0-20 units TID and 0-5 units at bedtime Novolog  4 units TID Tradjenta  5 mg QD  Inpatient Diabetes Program Recommendations:    Might consider:  Novolog  6 units TID with meals if she consumes at least 50%.   CBG was 462 this morning at 08:40-Wondering if she ate prior to the CBG?  Serum glucose was 114 mg/dL at 94:94.  Thank you, Wyvonna Pinal, MSN, CDCES Diabetes Coordinator Inpatient Diabetes Program [phone] (team pager from 8a-5p)

## 2024-09-21 NOTE — Progress Notes (Signed)
 Pt had CBG of 33, recheck shown 28.  Pt was still alert at time.  Hypoglycemia orders initiated and eight ounces of orange juice were give per order. Syrina Wake crackers and peanut butter also given.  Dr. Maree also notified and insulin  held.  15 minute recheck shown CBG of 78.  Will continue to monitor.

## 2024-09-21 NOTE — Plan of Care (Signed)
  Problem: Fluid Volume: Goal: Hemodynamic stability will improve Outcome: Progressing   Problem: Clinical Measurements: Goal: Diagnostic test results will improve Outcome: Progressing Goal: Signs and symptoms of infection will decrease Outcome: Progressing   Problem: Respiratory: Goal: Ability to maintain adequate ventilation will improve Outcome: Progressing   Problem: Education: Goal: Ability to describe self-care measures that may prevent or decrease complications (Diabetes Survival Skills Education) will improve Outcome: Progressing Goal: Individualized Educational Video(s) Outcome: Progressing   Problem: Coping: Goal: Ability to adjust to condition or change in health will improve Outcome: Progressing   Problem: Fluid Volume: Goal: Ability to maintain a balanced intake and output will improve Outcome: Progressing   Problem: Health Behavior/Discharge Planning: Goal: Ability to identify and utilize available resources and services will improve Outcome: Progressing Goal: Ability to manage health-related needs will improve Outcome: Progressing   Problem: Nutritional: Goal: Maintenance of adequate nutrition will improve Outcome: Progressing Goal: Progress toward achieving an optimal weight will improve Outcome: Progressing   Problem: Skin Integrity: Goal: Risk for impaired skin integrity will decrease Outcome: Progressing   Problem: Tissue Perfusion: Goal: Adequacy of tissue perfusion will improve Outcome: Progressing   Problem: Education: Goal: Knowledge of General Education information will improve Description: Including pain rating scale, medication(s)/side effects and non-pharmacologic comfort measures Outcome: Progressing

## 2024-09-21 NOTE — Progress Notes (Addendum)
 Daily Progress Note   Patient Name: Michele House       Date: 09/21/2024 DOB: [birth year]  Age: 68 y.o. MRN#: [id number] Attending Physician: Maree Hue, MD Primary Care Physician: [facility] Admit Date: 09/17/2024  Reason for Consultation/Follow-up: Establishing goals of care  Subjective: Notes and labs reviewed.  As discussed yesterday, called to speak with patient's daughter.  Daughter discusses that patient had initially desired a DNR and DNI status in January.  She states that her last admission she desired to reverse that to full code and full scope, to do everything.  She is able to articulate with great accuracy the patient's status.  She discusses trends in lab work and functional status, and discusses patient's imaging.  She discusses patient's current oncologic status. Daughter states she feels that her mother is ready for hospice care, and is aware that her mother would be a candidate should she choose goals of care that are in line with that plan.  She states her brother understands patient's time is limited in his mind, but his heart continues to want to do things that are life-prolonging, and thus his decisions are to continue life-prolonging care.  Daughter states there are no HPOA papers.  In to see patient.  She is currently resting in bed at this time.  Today she is able to tell me her name, where the hospital, the year of 2025, Trump is president, and that she came to the hospital because of shortness of breath.  She states that she is waiting for her bronchoscopy and that she feels she is ready for discharge when this is done.  She states she is tired of being in the hospital.  Revisited goals of care.  She states she wants all care possible to live  longer.  She states she has had a ventilator used before, and remembers this.  She states it is important to her to have CPR when her heart and breathing stop, and so she would want ventilator support to try to prevent death from respiratory failure and the need for CPR.  She states she would not want to live on life support, and only would want to be on it temporarily.  She discusses missing her dogs and the fact that she was brought here from rehab, and so she  has not seen them in a while.   Length of Stay: 4  Current Medications: Scheduled Meds:   (feeding supplement) PROSource Plus  30 mL Oral BID BM   acetylcysteine  4 mL Nebulization BID   aspirin  EC  81 mg Oral Daily   atorvastatin   20 mg Oral Daily   azithromycin   500 mg Oral QHS   busPIRone  20 mg Oral BID   calcium  carbonate  1 tablet Oral Daily   cefUROXime  500 mg Oral BID WC   collagenase  1 Application Topical Daily   enoxaparin  (LOVENOX ) injection  40 mg Subcutaneous Q24H   feeding supplement (GLUCERNA SHAKE)  237 mL Oral BID BM   ferrous sulfate  325 mg Oral BID WC   furosemide   20 mg Oral Daily   insulin  aspart  0-20 Units Subcutaneous TID WC   insulin  aspart  0-5 Units Subcutaneous QHS   insulin  aspart  4 Units Subcutaneous TID WC   insulin  glargine-yfgn  18 Units Subcutaneous Daily   ipratropium-albuterol   3 mL Nebulization BID   levETIRAcetam   500 mg Oral BID   levothyroxine   125 mcg Oral Q0600   lidocaine   1 patch Transdermal Q24H   linagliptin   5 mg Oral Daily   methylPREDNISolone  (SOLU-MEDROL ) injection  60 mg Intravenous Q24H   metoprolol succinate  50 mg Oral Daily   multivitamin with minerals  1 tablet Oral Daily   nutrition supplement (JUVEN)  1 packet Oral BID BM   nystatin  1 Application Topical BID   pantoprazole   40 mg Oral BID AC   polyethylene glycol  17 g Oral Daily   pregabalin   50 mg Oral BID   rOPINIRole   4 mg Oral QHS   senna  1 tablet Oral BID   sertraline   100 mg Oral Daily    spironolactone  25 mg Oral Daily   zinc oxide  1 Application Topical BID    Continuous Infusions:   PRN Meds: acetaminophen  **OR** acetaminophen , alum & mag hydroxide-simeth, chlorpheniramine-HYDROcodone , ipratropium-albuterol , magnesium  hydroxide, midodrine, ondansetron  **OR** ondansetron  (ZOFRAN ) IV, traZODone  Physical Exam Pulmonary:     Effort: Pulmonary effort is normal.  Skin:    General: Skin is warm and dry.  Neurological:     Mental Status: She is alert.             Vital Signs: BP 113/84 (BP Location: Right Arm)   Pulse 75   Temp 98.2 F (36.8 C)   Resp 16   Ht 5' 1 (1.549 m)   Wt 70.2 kg   LMP 12/24/1998 Comment: prior to her hysterectomy  SpO2 96%   BMI 29.24 kg/m  SpO2: SpO2: 96 % O2 Device: O2 Device: Nasal Cannula O2 Flow Rate: O2 Flow Rate (L/min): 3 L/min  Intake/output summary:  Intake/Output Summary (Last 24 hours) at 09/21/2024 1152 Last data filed at 09/21/2024 1007 Gross per 24 hour  Intake 180 ml  Output 1500 ml  Net -1320 ml   LBM: Last BM Date :  (Unknown last BM) Baseline Weight: Weight: 70.2 kg Most recent weight: Weight: 70.2 kg  Patient Active Problem List   Diagnosis Date Noted   Nephrolithiasis 09/18/2024   Sepsis due to pneumonia (HCC) 09/17/2024   COPD with acute exacerbation (HCC) 09/17/2024   Acute on chronic respiratory failure with hypoxia and hypercapnia (HCC) 09/17/2024   Seizure disorder (HCC) 09/17/2024   Dyslipidemia 09/17/2024   HFrEF (heart failure with reduced ejection fraction) (HCC) 09/17/2024   Type  2 diabetes mellitus with peripheral neuropathy (HCC) 09/17/2024   Acute respiratory failure with hypoxia and hypercarbia (HCC) 09/02/2024   Pressure injury of skin 09/02/2024   Encounter for antineoplastic chemotherapy 02/04/2024   IDA (iron  deficiency anemia) 01/07/2024   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) 12/17/2023   Anemia 12/17/2023   Acute deep vein thrombosis (DVT) of right lower extremity (HCC)  11/09/2023   Bilateral leg pain 11/09/2023   Melena 10/28/2023   Gastrointestinal hemorrhage with melena 10/28/2023   Duodenal ulcer 10/28/2023   CVA (cerebral vascular accident) (HCC) 10/14/2023   Hypotension 10/11/2023   Hypernatremia 10/11/2023   Uncontrolled type 2 diabetes mellitus with hypoglycemia, without long-term current use of insulin  (HCC) 10/08/2023   Acute metabolic encephalopathy 10/06/2023   Altered mental status 10/02/2023   UTI (urinary tract infection) 10/02/2023   (HFpEF) heart failure with preserved ejection fraction (HCC) 10/02/2023   Pneumonia 09/24/2023   COPD (chronic obstructive pulmonary disease) (HCC) 09/24/2023   Acute CHF (congestive heart failure) (HCC) 09/23/2023   COPD exacerbation (HCC) 06/07/2023   Acute on chronic respiratory failure with hypoxia (HCC) 06/07/2023   Allergic reaction 06/07/2023   Disequilibrium 07/16/2015   Pain in right shoulder 05/28/2015   Dysphagia 05/08/2015   Acute sinusitis 10/02/2014   Other specified anxiety disorders 09/19/2014   Low back pain 07/03/2014   Shoulder joint pain 05/01/2014   Other malaise 02/01/2014   Chest discomfort 10/12/2013   Tobacco use disorder 10/12/2013   Essential hypertension 10/12/2013   Obesity 10/12/2013   Diabetes mellitus (HCC) 10/12/2013   Allergic rhinitis 03/23/2012   Trigger finger 03/23/2012   Vitamin D  deficiency 03/23/2012   Pure hypercholesterolemia 09/05/2009   Hypothyroidism 12/24/2006   Osteoarthritis 12/24/2006   Scoliosis 12/24/2006    Palliative Care Assessment & Plan   Recommendations/Plan: Would recommend outpatient palliative, and would recommend visiting with Sidra Mower NP if patient returns to [facility] oncology.  Code Status:    Code Status Orders  (From admission, onward)           Start     Ordered   09/17/24 2336  Full code  Continuous       Question:  By:  Answer:  Consent: discussion documented in EHR   09/17/24 2337           Code Status  History     Date Active Date Inactive Code Status Order ID Comments User Context   09/01/2024 1100 09/05/2024 0241 Full Code [id number]  Laurita Cort DASEN, MD ED   01/18/2024 1124 01/19/2024 0518 Full Code [id number]  Luverne Aran, MD HOV   10/31/2023 1455 12/03/2023 0010 Do not attempt resuscitation (DNR) PRE-ARREST INTERVENTIONS DESIRED [id number]  Arloa Waddell RAMAN, NP Inpatient   10/02/2023 0738 10/31/2023 1455 Full Code [id number]  Eldonna Elspeth PARAS, MD ED   09/23/2023 2025 09/26/2023 2320 Full Code [id number]  Cleatus Delayne GAILS, MD ED   06/07/2023 1758 06/08/2023 2020 Full Code [id number]  Eldonna Elspeth PARAS, MD ED       Camelia Lewis, NP  Please contact Palliative Medicine Team phone at [phone] for questions and concerns.

## 2024-09-22 ENCOUNTER — Inpatient Hospital Stay

## 2024-09-22 ENCOUNTER — Encounter
Admission: EM | Disposition: A | Discharge status: Skilled Nursing Facility | Payer: Self-pay | Source: Skilled Nursing Facility | Attending: Internal Medicine

## 2024-09-22 ENCOUNTER — Encounter: Payer: Self-pay | Admitting: Family Medicine

## 2024-09-22 ENCOUNTER — Encounter: Payer: Self-pay | Admitting: Internal Medicine

## 2024-09-22 ENCOUNTER — Encounter

## 2024-09-22 ENCOUNTER — Other Ambulatory Visit: Payer: Self-pay

## 2024-09-22 ENCOUNTER — Encounter: Payer: Self-pay | Admitting: Oncology

## 2024-09-22 HISTORY — PX: BRONCHIAL BIOPSY: SHX5109

## 2024-09-22 LAB — CULTURE, BLOOD (ROUTINE X 2)
Culture: NO GROWTH
Culture: NO GROWTH
Special Requests: ADEQUATE

## 2024-09-22 LAB — BASIC METABOLIC PANEL WITH GFR
BUN: 21 mg/dL (ref 8–23)
CO2: >45 mmol/L — ABNORMAL HIGH (ref 22–32)
Calcium: 10.8 mg/dL — ABNORMAL HIGH (ref 8.9–10.3)
Chloride: 93 mmol/L — ABNORMAL LOW (ref 98–111)
Creatinine, Ser: 0.67 mg/dL (ref 0.44–1.00)
GFR, Estimated: >60 mL/min (ref >60)
Glucose, Bld: 143 mg/dL — ABNORMAL HIGH (ref 70–99)
Potassium: 5.2 mmol/L — ABNORMAL HIGH (ref 3.5–5.1)
Sodium: 143 mmol/L (ref 135–145)

## 2024-09-22 LAB — CBC
HCT: 31.4 % — ABNORMAL LOW (ref 36.0–46.0)
Hemoglobin: 9.4 g/dL — ABNORMAL LOW (ref 12.0–15.0)
MCH: 25.2 pg — ABNORMAL LOW (ref 26.0–34.0)
MCHC: 29.9 g/dL — ABNORMAL LOW (ref 30.0–36.0)
MCV: 84.2 fL (ref 80.0–100.0)
Platelets: 181 K/uL (ref 150–400)
RBC: 3.73 MIL/uL — ABNORMAL LOW (ref 3.87–5.11)
RDW: 16.4 % — ABNORMAL HIGH (ref 11.5–15.5)
WBC: 5.6 K/uL (ref 4.0–10.5)
nRBC: 0 % (ref 0.0–0.2)

## 2024-09-22 LAB — GLUCOSE, CAPILLARY
Glucose-Capillary: 94 mg/dL (ref 70–99)
Glucose-Capillary: 115 mg/dL — ABNORMAL HIGH (ref 70–99)
Glucose-Capillary: 123 mg/dL — ABNORMAL HIGH (ref 70–99)

## 2024-09-22 SURGERY — BRONCHOSCOPY, WITH BIOPSY
Anesthesia: General | Laterality: Left

## 2024-09-22 MED ORDER — IPRATROPIUM-ALBUTEROL 0.5-2.5 (3) MG/3ML IN SOLN
RESPIRATORY_TRACT | Status: AC
  Filled 2024-09-22: qty 3

## 2024-09-22 MED ORDER — PROPOFOL 1000 MG/100ML IV EMUL
INTRAVENOUS | Status: AC
  Filled 2024-09-22: qty 100

## 2024-09-22 MED ORDER — CEFUROXIME AXETIL 500 MG PO TABS
500.0000 mg | ORAL_TABLET | Freq: Two times a day (BID) | ORAL | 0 refills | Status: AC
  Filled 2024-09-22: qty 6, 3d supply, fill #0

## 2024-09-22 MED ORDER — ROCURONIUM BROMIDE 10 MG/ML (PF) SYRINGE
PREFILLED_SYRINGE | INTRAVENOUS | Status: AC
  Filled 2024-09-22: qty 10

## 2024-09-22 MED ORDER — PREDNISONE 10 MG PO TABS
ORAL_TABLET | ORAL | 0 refills | Status: AC
  Filled 2024-09-22: qty 21, 6d supply, fill #0

## 2024-09-22 MED ORDER — FERROUS SULFATE 325 (65 FE) MG PO TABS
325.0000 mg | ORAL_TABLET | Freq: Every day | ORAL | 3 refills | Status: DC
  Filled 2024-09-22: qty 60, 60d supply, fill #0

## 2024-09-22 MED ORDER — PHENYLEPHRINE 80 MCG/ML (10ML) SYRINGE FOR IV PUSH (FOR BLOOD PRESSURE SUPPORT)
PREFILLED_SYRINGE | INTRAVENOUS | Status: DC | PRN
  Administered 2024-09-22 (×2): 120 ug via INTRAVENOUS

## 2024-09-22 MED ORDER — SUGAMMADEX SODIUM 200 MG/2ML IV SOLN
INTRAVENOUS | Status: DC | PRN
  Administered 2024-09-22: 200 mg via INTRAVENOUS

## 2024-09-22 MED ORDER — ROCURONIUM BROMIDE 100 MG/10ML IV SOLN
INTRAVENOUS | Status: DC | PRN
  Administered 2024-09-22: 50 mg via INTRAVENOUS

## 2024-09-22 MED ORDER — PROPOFOL 10 MG/ML IV BOLUS
INTRAVENOUS | Status: DC | PRN
  Administered 2024-09-22: 80 mg via INTRAVENOUS

## 2024-09-22 MED ORDER — IPRATROPIUM-ALBUTEROL 0.5-2.5 (3) MG/3ML IN SOLN: 3.0000 mL | Freq: Once | RESPIRATORY_TRACT | Status: DC

## 2024-09-22 MED ORDER — PROPOFOL 10 MG/ML IV BOLUS
INTRAVENOUS | Status: AC
  Filled 2024-09-22: qty 20

## 2024-09-22 MED ORDER — FENTANYL CITRATE (PF) 100 MCG/2ML IJ SOLN
INTRAMUSCULAR | Status: AC
  Filled 2024-09-22: qty 2

## 2024-09-22 MED ORDER — LACTATED RINGERS IV SOLN
INTRAVENOUS | Status: DC | PRN
Start: 2024-09-22 — End: 2024-09-22

## 2024-09-22 MED ORDER — PROPOFOL 500 MG/50ML IV EMUL
INTRAVENOUS | Status: DC | PRN
  Administered 2024-09-22: 125 ug/kg/min via INTRAVENOUS

## 2024-09-22 MED ORDER — ONDANSETRON HCL 4 MG/2ML IJ SOLN: 4.0000 mg | Freq: Once | INTRAMUSCULAR | Status: DC | PRN

## 2024-09-22 NOTE — Anesthesia Preprocedure Evaluation (Addendum)
 Anesthesia Evaluation  Patient identified by MRN, date of birth, ID band Patient confused    Reviewed: Allergy & Precautions, NPO status , Patient's Chart, lab work & pertinent test results  History of Anesthesia Complications (+) PROLONGED EMERGENCE and history of anesthetic complications  Airway Mallampati: III  TM Distance: <3 FB Neck ROM: full    Dental  (+) Missing   Pulmonary shortness of breath, pneumonia, unresolved, COPD,  COPD inhaler and oxygen dependent, former smoker On 5LO2 at home.    + rhonchi  + decreased breath sounds      Cardiovascular hypertension, Normal cardiovascular exam  Echo 2024: EF 55-60%   Neuro/Psych Seizures -, Well Controlled,  PSYCHIATRIC DISORDERS Anxiety     3 strokes in 2024. Seizures at that time. Started on Keppra . Seizure free since then per daughter  AxO x 2. Consent obtained from daughter.  CVA, Residual Symptoms    GI/Hepatic Neg liver ROS, PUD,GERD  Controlled,,  Endo/Other  diabetes, Type 2Hypothyroidism    Renal/GU      Musculoskeletal   Abdominal   Peds  Hematology  (+) Blood dyscrasia, anemia   Anesthesia Other Findings K 5.2 09/22/24  Past Medical History: No date: Acid reflux No date: COPD (chronic obstructive pulmonary disease) (HCC) No date: Diabetes mellitus without complication (HCC) No date: Hyperlipidemia No date: Hypertension No date: Thyroid  disease  Past Surgical History: No date: appendectomy No date: APPENDECTOMY No date: BREAST BIOPSY; Right     Comment:  CORE W/CLIP - NEG 10/28/2023: ESOPHAGOGASTRODUODENOSCOPY; N/A     Comment:  Procedure: ESOPHAGOGASTRODUODENOSCOPY (EGD);  Surgeon:               Jinny Carmine, MD;  Location: [facility] ENDOSCOPY;  Service:               Endoscopy;  Laterality: N/A; 10/30/2023: IR EMBO ART  VEN HEMORR LYMPH EXTRAV  INC GUIDE  ROADMAPPING 11/09/2023: IVC FILTER INSERTION; N/A     Comment:  Procedure: IVC FILTER  INSERTION;  Surgeon: Marea Selinda RAMAN,              MD;  Location: [facility] INVASIVE CV LAB;  Service:               Cardiovascular;  Laterality: N/A; No date: TOTAL VAGINAL HYSTERECTOMY No date: TUMOR REMOVAL     Comment:  benign;stomach 10/20/2023: VIDEO BRONCHOSCOPY WITH ENDOBRONCHIAL NAVIGATION; Right     Comment:  Procedure: VIDEO BRONCHOSCOPY WITH ENDOBRONCHIAL               NAVIGATION;  Surgeon: Parris Manna, MD;  Location:               [facility] ORS;  Service: Thoracic;  Laterality: Right;  BMI    Body Mass Index: 26.65 kg/m      Reproductive/Obstetrics negative OB ROS                              Anesthesia Physical Anesthesia Plan  ASA: 4  Anesthesia Plan: General ETT   Post-op Pain Management:    Induction: Intravenous  PONV Risk Score and Plan: Ondansetron , Dexamethasone , Midazolam  and Treatment may vary due to age or medical condition  Airway Management Planned: Oral ETT  Additional Equipment:   Intra-op Plan:   Post-operative Plan: Possible Post-op intubation/ventilation  Informed Consent: I have reviewed the patients History and Physical, chart, labs and discussed the procedure including the risks, benefits and alternatives for  the proposed anesthesia with the patient or authorized representative who has indicated his/her understanding and acceptance.    Suspend DNR.   Dental Advisory Given  Plan Discussed with: Anesthesiologist, CRNA and Surgeon  Anesthesia Plan Comments: (  Daughter consented for risks of anesthesia including but not limited to:  -post-op ventilation - adverse reactions to medications - damage to eyes, teeth, lips or other oral mucosa - nerve damage due to positioning  - sore throat or hoarseness - Damage to heart, brain, nerves, lungs, other parts of body or loss of life All questions answered. )        Anesthesia Quick Evaluation

## 2024-09-22 NOTE — Anesthesia Procedure Notes (Addendum)
 Procedure Name: Intubation Date/Time: 09/22/2024 7:52 AM  Performed by: Brien Sotero PARAS, CRNAPre-anesthesia Checklist: Patient identified, Patient being monitored, Timeout performed, Emergency Drugs available and Suction available Patient Re-evaluated:Patient Re-evaluated prior to induction Oxygen Delivery Method: Circle system utilized Preoxygenation: Pre-oxygenation with 100% oxygen Induction Type: IV induction Ventilation: Mask ventilation without difficulty Laryngoscope Size: 3 and McGrath Grade View: Grade I Tube type: Oral Tube size: 8.0 mm Number of attempts: 1 Airway Equipment and Method: Stylet Placement Confirmation: ETT inserted through vocal cords under direct vision, positive ETCO2 and breath sounds checked- equal and bilateral Secured at: 21 cm Tube secured with: Tape Dental Injury: Teeth and Oropharynx as per pre-operative assessment

## 2024-09-22 NOTE — Transfer of Care (Signed)
 Immediate Anesthesia Transfer of Care Note  Patient: Michele House  Procedure(s) Performed: BRONCHOSCOPY, WITH BIOPSY (Left)  Patient Location: PACU  Anesthesia Type:General  Level of Consciousness: awake  Airway & Oxygen Therapy: Patient Spontanous Breathing and Patient connected to face mask oxygen  Post-op Assessment: Report given to RN and Post -op Vital signs reviewed and stable  Post vital signs: Reviewed and stable  Last Vitals:  Vitals Value Taken Time  BP 112/55 09/22/24 08:18  Temp    Pulse 80 09/22/24 08:24  Resp 16 09/22/24 08:24  SpO2 97 % 09/22/24 08:24  Vitals shown include unfiled device data.  Last Pain:  Vitals:   09/22/24 0702  TempSrc: Temporal  PainSc: 0-No pain         Complications: No notable events documented.

## 2024-09-22 NOTE — Plan of Care (Signed)

## 2024-09-22 NOTE — Progress Notes (Signed)
   09/22/24 1645  Spiritual Encounters  Type of Visit Attempt (pt unavailable)  Care provided to: Pt not available  Conversation partners present during encounter Other (comment) Training And Development Officer)  Referral source Other (comment) ([facility])  Reason for visit Advance directives  OnCall Visit Yes   Chaplain went to the patient's room to give the AD paperwork per a [facility]o Consult in the system; however, when the Chaplain arrived, she learned the patient was discharged.    Rev. Rana M. Nicholaus, M.Div. Chaplain Resident [facility]

## 2024-09-22 NOTE — Discharge Summary (Signed)
 Physician Discharge Summary   Patient: Michele House MRN: [id number] DOB: 02/02/1956  Admit date:     09/17/2024  Discharge date: 09/22/24  Discharge Physician: Cresencio Fairly   PCP: [facility]   Recommendations at discharge:   Follow-up with outpatient providers as requested  Discharge Diagnoses: Principal Problem:   Sepsis due to pneumonia [facility]) Active Problems:   COPD with acute exacerbation (HCC)   Acute on chronic respiratory failure with hypoxia and hypercapnia (HCC)   Seizure disorder (HCC)   Dyslipidemia   HFrEF (heart failure with reduced ejection fraction) (HCC)   Type 2 diabetes mellitus with peripheral neuropathy (HCC)   Nephrolithiasis  Hospital Course: Assessment and Plan:  68 y.o. 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  On presentation also had worsening dyspnea, hypoxia.  Patient admitted for sepsis due to pneumonia, hospital course as below   11/19: Discontinue telemetry, transfer to any MedSurg 11/20: Blood sugar dropped in 30s and hypoglycemia protocol initiated   Subjective:   Hoping to get the bronchoscopy done tomorrow, some cough     Objective:       Vitals:    09/20/24 2113 09/20/24 2147 09/21/24 0426 09/21/24 0807  BP: 121/65   115/60 113/84  Pulse: 76   74 75  Resp: 15   16 16   Temp: 97.8 F (36.6 C)   97.7 F (36.5 C) 98.2 F (36.8 C)  TempSrc: Oral   Oral    SpO2: 100% 100% 100% 96%  Weight:          Height:              Intake/Output Summary (Last 24 hours) at 09/21/2024 1410 Last data filed at 09/21/2024 1007    Gross per 24 hour  Intake 180 ml  Output 1500 ml  Net -1320 ml       Filed Weights     09/17/24 1846  Weight: 70.2 kg      Examination: GENERAL:  68 y.o.-year-old patient lying in the bed with no acute distress.  NECK:  Supple, no jugular venous distention. No thyroid  enlargement, no tenderness.  LUNGS: Diminished bibasilar breath sounds at bases.  CARDIOVASCULAR: Regular rate and rhythm, S1, S2 normal. No murmurs, rubs, or gallops.  ABDOMEN: Soft, benign EXTREMITIES: No pedal edema, cyanosis, or clubbing.  NEUROLOGIC: Cranial nerves II through XII are intact. Muscle strength 5/5 in all extremities. Sensation intact.  PSYCHIATRIC: Awake, alert   Assessment & Plan:  Sepsis due to pneumonia Complete atelectasis left lower lobe Non-small cell lung cancer - LA normal, No leukocytosis - Flu/COVID/RSV negative, UA with squamous cells (likely contaminant) - CTA LLL consolidation with mucus impaction and complete obstruction of LLL bronchus.  Fluid in the main bronchi, complete consolidation/atelectasis of left lower lobe, Cardiomegaly with right chamber prominence indicating right heart dysfunction. RUL suprahilar mass bronchovascular encasement and bronchial narrowing, size approximately 5 into 6.6 cm, not grossly changed -with associated pleural thickening suspicious for pleural metastatic disease. Progressive diffuse interstitial thickening and micronodularity, consistent with lymphangitic carcinomatosis. Mediastinal and hilar adenopathy - Treated aggressively with antibiotics while in the hospital.  Complete course of oral Ceftin .  3 more days of antibiotics at discharge - s/p  bronchoscopy on 09/22/2024 with removal of heavy amounts of mucus plugging, she is oxygenating much better now and is cleared for dc home by PCCM. Microbiology with BAL specimens are in process and PCCM will follow up on this outpatient.  Complete current PO abx for 3 more days    COPD with acute exacerbation - Throat and at baseline.  Prednisone  taper at discharge   Acute on chronic respiratory failure  with hypoxia and hypercapnia - Now at baseline 3 L oxygen upon discharge   Mild hyperkalemia - resolved - s/p Lokelma  5g   Mild right hydronephrosis - Imaging shows Right nephrolithiasis including a 6 mm renal pelvis just above the UPJ with mild right hydronephrosis - If new/ worsening right flank pain -recommend CT stone protocol, otherwise can follow-up outpatient - Seen by urology, outpatient follow-up with urology   Chronic HFrEF (heart failure with reduced ejection fraction) - Euvolemic   Dementia - Supportive care   History of right leg DVT, status post IVC filter - Happened January of this year, IVC filter placed instead of anticoagulation due to concurrent severe GI bleed.     History of duodenal ulcer and upper GI bleed  - During same hospitalization in January, patient also developed severe duodenum bleeding, subsequently underwent EGD and clipping but clipping failed and patient received IR embolization 2 days later. Stable H & H   Seizure disorder - Continue Keppra    DM - controlled         Consultants: Urology, PCCM Procedures performed: Bronch  Disposition: Long term care facility Diet recommendation:  Discharge Diet Orders (From admission, onward)     Start     Ordered   09/22/24 0000  Diet - low sodium heart healthy        09/22/24 1208           Carb modified diet DISCHARGE MEDICATION: Allergies as of 09/22/2024       Reactions   Penicillins Hives   Aspirin  Other (See Comments)   Lips numb   Sulfa Antibiotics         Medication List     STOP taking these medications    lidocaine  5 % Commonly known as: LIDODERM    losartan  25 MG tablet Commonly known as: COZAAR    meclizine  12.5 MG tablet Commonly known as: ANTIVERT    naloxone 4 MG/0.1ML Liqd nasal spray kit Commonly known as: NARCAN   traMADol  50 MG tablet Commonly known as: ULTRAM        TAKE these medications    (feeding supplement) PROSource Plus liquid Take 30  mLs by mouth 2 (two) times daily between meals.   feeding supplement (GLUCERNA SHAKE) Liqd Take 237 mLs by mouth 2 (two) times daily between meals.   nutrition supplement (JUVEN) Pack Take 1 packet by mouth 2 (two) times daily between meals.   acetaminophen  325 MG tablet Commonly known as: TYLENOL  Take 650 mg by mouth every 6 (six) hours as needed.   alum & mag hydroxide-simeth 400-400-40 MG/5ML suspension Commonly known as: MAALOX PLUS Take 10 mLs by mouth every 6 (six) hours as needed for indigestion.   aspirin  EC 81 MG tablet Take 81 mg by mouth daily.   atorvastatin  20 MG tablet Commonly known as: LIPITOR Take 20 mg by mouth daily.   busPIRone  10 MG tablet Commonly known as: BUSPAR  Take 20 mg by mouth 2 (two) times daily.   calcium  carbonate 500 MG chewable tablet Commonly known as: TUMS - dosed in mg elemental calcium   Chew 1 tablet by mouth daily.   cefUROXime  500 MG tablet Commonly known as: CEFTIN  Take 1 tablet (500 mg total) by mouth 2 (two) times daily with a meal for 3 days.   cetirizine 10 MG tablet Commonly known as: ZYRTEC Take 10 mg by mouth daily.   collagenase  250 UNIT/GM ointment Commonly known as: SANTYL  Apply topically daily.   Dulera 200-5 MCG/ACT Aero Generic drug: mometasone-formoterol Inhale 2 puffs into the lungs every 12 (twelve) hours.   ferrous sulfate  325 (65 FE) MG tablet Take 1 tablet (325 mg total) by mouth daily with breakfast. What changed: when to take this   furosemide  20 MG tablet Commonly known as: LASIX  Take 1 tablet (20 mg total) by mouth daily.   glipiZIDE  5 MG tablet Commonly known as: GLUCOTROL  Take 5 mg by mouth daily before breakfast.   insulin  aspart 100 UNIT/ML injection Commonly known as: novoLOG  Inject 4 Units into the skin 3 (three) times daily with meals.   insulin  glargine-yfgn 100 UNIT/ML injection Commonly known as: SEMGLEE  Inject 0.18 mLs (18 Units total) into the skin daily.   ipratropium 17  MCG/ACT inhaler Commonly known as: ATROVENT HFA Inhale 2 puffs into the lungs every 6 (six) hours as needed.   ipratropium-albuterol  0.5-2.5 (3) MG/3ML Soln Commonly known as: DUONEB Take 3 mLs by nebulization 2 (two) times daily.   levETIRAcetam  500 MG tablet Commonly known as: KEPPRA  Take 1 tablet (500 mg total) by mouth 2 (two) times daily.   levothyroxine  125 MCG tablet Commonly known as: SYNTHROID  Take 125 mcg by mouth every morning.   lidocaine -prilocaine  cream Commonly known as: EMLA  Apply 1 Application topically once.   metoprolol  succinate 50 MG 24 hr tablet Commonly known as: TOPROL -XL Take 50 mg by mouth daily. Take with or immediately following a meal.   multivitamin capsule Take 1 capsule by mouth daily.   nystatin  powder Commonly known as: MYCOSTATIN /NYSTOP  Apply 1 Application topically 2 (two) times daily.   ondansetron  4 MG/5ML solution Commonly known as: ZOFRAN  Take 8 mg by mouth every 8 (eight) hours as needed for refractory nausea / vomiting.   pantoprazole  40 MG tablet Commonly known as: PROTONIX  Take 1 tablet (40 mg total) by mouth 2 (two) times daily before a meal.   polyethylene glycol 17 g packet Commonly known as: MIRALAX  / GLYCOLAX  Take 17 g by mouth daily.   predniSONE  10 MG (21) Tbpk tablet Commonly known as: STERAPRED UNI-PAK 21 TAB Start 60 mg po daily, taper 10 mg daily until finish   pregabalin  50 MG capsule Commonly known as: LYRICA  Take 50 mg by mouth 2 (two) times daily.   rOPINIRole  4 MG 24 hr tablet Commonly known as: REQUIP  XL Take 1 tablet (4 mg total) by mouth at bedtime.   senna 8.6 MG Tabs tablet Commonly known as: SENOKOT Take 1 tablet (8.6 mg total) by mouth in the morning and at bedtime.   sertraline  100 MG tablet Commonly known as: ZOLOFT  Take 100 mg by mouth daily.   spironolactone  25 MG tablet Commonly known as: ALDACTONE  Take 25 mg by mouth daily.   Tradjenta  5 MG Tabs tablet Generic drug:  linagliptin  Take 5 mg by mouth daily.   ZINC  OXIDE (TOPICAL) 25 % Oint Apply 1 Application topically 2 (two) times daily.        Contact information for after-discharge care     Destination     [facility] [place], [place]. [place]   Service: Skilled Nursing Contact information: [address]  [address] [place]  [address] [phone]                    Discharge Exam: Filed Weights   09/17/24 1846 09/22/24 0702  Weight: 70.2 kg 70.2 kg   GENERAL:  68 y.o.-year-old patient lying in the bed with no acute distress.  NECK:  Supple, no jugular venous distention. No thyroid  enlargement, no tenderness.  LUNGS: Diminished bibasilar breath sounds at bases.  CARDIOVASCULAR: Regular rate and rhythm, S1, S2 normal. No murmurs, rubs, or gallops.  ABDOMEN: Soft, benign EXTREMITIES: No pedal edema, cyanosis, or clubbing.  NEUROLOGIC: Awake, alert.  Nonfocal  Condition at discharge: fair  The results of significant diagnostics from this hospitalization (including imaging, microbiology, ancillary and laboratory) are listed below for reference.   Imaging Studies: CT Angio Chest PE W and/or Wo Contrast Result Date: 09/17/2024 EXAM: CTA CHEST 09/17/2024 10:07:39 PM TECHNIQUE: CTA of the chest was performed after the administration of intravenous contrast. Multiplanar reformatted images are provided for review. MIP images are provided for review. Automated exposure control, iterative reconstruction, and/or weight based adjustment of the mA/kV was utilized to reduce the radiation dose to as low as reasonably achievable. COMPARISON: None available. CLINICAL HISTORY: Lung cancer patient with right upper lobe neoplasm presenting with hypoxemia and respiratory distress. Pulmonary embolism (PE) suspected, high prob. FINDINGS: PULMONARY ARTERIES: Pulmonary arteries are adequately opacified for evaluation. The pulmonary arteries are slightly prominent but no arterial embolus is seen. MEDIASTINUM:  There is mild cardiomegaly with a slight right chamber predominance with RV/LV ratio of 1.06. There is no pericardial effusion. There is atherosclerosis in the aorta, great vessels, small amount in the left main and proximal LAD coronary artery. No aortic dissection or aneurysm. The pulmonary veins are nondilated. There is mild chronic enlargement and heterogeneity of the thyroid  gland without a dominant nodule. There is a chronically patulous esophagus without wall thickening. IVC filter in place. Left chest mediport with IJ approach catheter terminating in the SVC. LYMPH NODES: Subcarinal adenopathy is unchanged up to 1.2 cm on axial 58 of series 6, with increased prominence of AP window and left hilar nodes up to 1.2 cm, increased prominence of right hilar nodes up to 1.5 cm on axial 67. LUNGS AND PLEURA: Lungs are emphysematous. There is mild layering fluid in the distal trachea. There is layering fluid in the bilateral main bronchi, on the left extending into and obstructing the main lower lobe and lower lobe segmental bronchi, with consolidation throughout the left lower lobe consistent with a consolidated pneumonia. There is a right upper lobe suprahilar mass encasing central bronchovascular structures with bronchial narrowing. It is difficult to distinguish between the mass and adjacent pulmonary consolidation. In total, the mass and consolidation today measure 5 x 6.6 cm on axial 47 of series 6, not grossly changed from the PET CT but certainly significantly larger than on 05/24/2024. There is surrounding spiculation and stranding to pleural surfaces and areas of pleural thickening in the right chest superiorly and medially probably due to pleural metastatic disease and hypermetabolic on the PET CT. There is a small layering right pleural effusion with partial loculation in the superior chest. In the right lower lobe, several coarsely nodular opacities are again noted, consistent with patchy airspace  disease versus intraorgan metastases. The largest of these is 2.3 x 1.9 cm on series 8 axial 82, unchanged. Elsewhere, there is progressive diffuse interstitial thickening and micronodularity relatively sparing the right middle lobe and first seen on PET  CT but worsened since. 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 No pneumothorax. UPPER ABDOMEN: The gallbladder is dilated and there are tiny layering stones posteriorly without wall thickening. There are stones in the right renal collecting system and renal pelvis, largest is 6 mm just above the UPJ. There is mild right hydronephrosis. The liver is steatotic. There is a 2 cm Bosniak 1 cyst in the right upper pole. SOFT TISSUES AND BONES: There is osteopenia and kyphosis of the thoracic spine. No destructive bone lesions. No acute soft tissue abnormality. IMPRESSION: 1. No evidence of pulmonary embolism. 2. Left lower lobe consolidation with mucus impaction and complete obstruction of the left lower lobe bronchus, consistent with pneumonia. 3. Cardiomegaly with right chamber predominance indicating right heart dysfunction. the pulmonary arteries are slightly prominent. 4. Fluid in the main bronchi, obstructing the left lower lobe main and segmental bronchi with complete consolidation/ atelectasis of the left lower lobe. 5. Right upper lobe suprahilar mass with bronchovascular encasement and bronchial narrowing, overall size approximately 5 x 6.6 cm, not grossly changed from recent PET CT but increased from 05/24/2024, with associated pleural thickening suspicious for pleural metastatic disease and small partially loculated right pleural effusion . 6. 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 7. Mediastinal and hilar adenopathy with interval increase in AP window and bilateral hilar nodes up to 1.5 cm and stable  subcarinal node up to 1.2 cm. 8. Right nephrolithiasis including a 6 mm renal pelvis just above the UPJ with mild right hydronephrosis. 9. Emphysema, with aortic and coronary atherosclerosis. Electronically signed by: Francis Quam MD 09/17/2024 10:53 PM EST RP Workstation: [id number]   DG Chest Port 1 View Result Date: 09/17/2024 EXAM: 1 VIEW(S) XRAY OF THE CHEST 09/17/2024 07:45:00 PM COMPARISON: 09/02/2024 CLINICAL HISTORY: Questionable sepsis - evaluate for abnormality FINDINGS: LINES, TUBES AND DEVICES: Left-sided port-a-cath tip overlies mid SVC. LUNGS AND PLEURA: Alveolar consolidation right upper lung and diffusely on the left. Small left-sided pleural effusion. No pneumothorax. HEART AND MEDIASTINUM: No acute abnormality of the cardiac and mediastinal silhouettes. BONES AND SOFT TISSUES: No acute osseous abnormality. IMPRESSION: 1. Alveolar consolidation in the right upper lung and diffusely on the left. 2. Small left-sided pleural effusion. Electronically signed by: Fonda Field MD 09/17/2024 07:48 PM EST RP Workstation: [id number]   DG Chest 1 View Result Date: 09/02/2024 CLINICAL DATA:  Congestive heart failure. EXAM: CHEST  1 VIEW COMPARISON:  09/01/2024 FINDINGS: Stable mild cardiomegaly. Left-sided power port remains in appropriate position. Diffuse pulmonary interstitial infiltrates show no significant change. Poorly defined airspace opacity in the right upper lobe also shows no significant change. No pneumothorax or pleural effusion identified. IMPRESSION: No significant change in diffuse bilateral interstitial infiltrates and right upper lobe airspace opacity. Electronically Signed   By: Norleen DELENA Kil M.D.   On: 09/02/2024 06:25   DG Chest Portable 1 View Result Date: 09/01/2024 EXAM: 1 VIEW(S) XRAY OF THE CHEST 09/01/2024 09:52:00 AM COMPARISON: 08/08/2024 CLINICAL HISTORY: hypoxia FINDINGS: LINES, TUBES AND DEVICES: Stable left internal jugular port-a-cath. LUNGS AND PLEURA: Stable  bilateral diffuse interstitial densities are noted concerning for edema or inflammation. Right upper lobe opacity is again noted concerning for neoplasm or possible pneumonia. Minimal pleural effusions may be present. No pneumothorax. HEART AND MEDIASTINUM: Stable cardiomediastinal silhouette. BONES AND SOFT TISSUES: No acute osseous abnormality. IMPRESSION: 1. Stable bilateral diffuse interstitial densities, suggestive of edema or inflammation. 2. Stable right upper lobe opacity, suspicious  for neoplasm or pneumonia. 3. Minimal pleural effusions. Electronically signed by: Lynwood Seip MD 09/01/2024 10:26 AM EDT RP Workstation: [id number]    Microbiology: Results for orders placed or performed during the hospital encounter of 09/17/24  Blood Culture (routine x 2)     Status: None   Collection Time: 09/17/24  6:55 PM   Specimen: BLOOD  Result Value Ref Range Status   Specimen Description BLOOD LEFT ANTECUBITAL  Final   Special Requests   Final    BOTTLES DRAWN AEROBIC AND ANAEROBIC Blood Culture results may not be optimal due to an inadequate volume of blood received in culture bottles   Culture   Final    NO GROWTH 5 DAYS Performed at [facility], [address] [place], [place] [address]    Report Status 09/22/2024 FINAL  Final  Resp panel by RT-PCR (RSV, Flu A&B, Covid) Anterior Nasal Swab     Status: None   Collection Time: 09/17/24  7:10 PM   Specimen: Anterior Nasal Swab  Result Value Ref Range Status   SARS Coronavirus 2 by RT PCR NEGATIVE NEGATIVE Final    Comment: (NOTE) SARS-CoV-2 target nucleic acids are NOT DETECTED.  The SARS-CoV-2 RNA is generally detectable in upper respiratory specimens during the acute phase of infection. The lowest concentration of SARS-CoV-2 viral copies this assay can detect is 138 copies/mL. A negative result does not preclude SARS-Cov-2 infection and should not be used as the sole basis for treatment or other patient management decisions.  A negative result may occur with  improper specimen collection/handling, submission of specimen other than nasopharyngeal swab, presence of viral mutation(s) within the areas targeted by this assay, and inadequate number of viral copies(<138 copies/mL). A negative result must be combined with clinical observations, patient history, and epidemiological information. The expected result is Negative.  Fact Sheet for Patients:  bloggercourse.com  Fact Sheet for Healthcare Providers:  seriousbroker.it  This test is no t yet approved or cleared by the [place]  FDA and  has been authorized for detection and/or diagnosis of SARS-CoV-2 by FDA under an Emergency Use Authorization (EUA). This EUA will remain  in effect (meaning this test can be used) for the duration of the COVID-19 declaration under Section 564(b)(1) of the Act, 21 U.S.C.section 360bbb-3(b)(1), unless the authorization is terminated  or revoked sooner.       Influenza A by PCR NEGATIVE NEGATIVE Final   Influenza B by PCR NEGATIVE NEGATIVE Final    Comment: (NOTE) The Xpert Xpress SARS-CoV-2/FLU/RSV plus assay is intended as an aid in the diagnosis of influenza from Nasopharyngeal swab specimens and should not be used as a sole basis for treatment. Nasal washings and aspirates are unacceptable for Xpert Xpress SARS-CoV-2/FLU/RSV testing.  Fact Sheet for Patients: bloggercourse.com  Fact Sheet for Healthcare Providers: seriousbroker.it  This test is not yet approved or cleared by the [place]  FDA and has been authorized for detection and/or diagnosis of SARS-CoV-2 by FDA under an Emergency Use Authorization (EUA). This EUA will remain in effect (meaning this test can be used) for the duration of the COVID-19 declaration under Section 564(b)(1) of the Act, 21 U.S.C. section 360bbb-3(b)(1), unless the authorization  is terminated or revoked.     Resp Syncytial Virus by PCR NEGATIVE NEGATIVE Final    Comment: (NOTE) Fact Sheet for Patients: bloggercourse.com  Fact Sheet for Healthcare Providers: seriousbroker.it  This test is not yet approved or cleared by the [place]  FDA and has been authorized for  detection and/or diagnosis of SARS-CoV-2 by FDA under an Emergency Use Authorization (EUA). This EUA will remain in effect (meaning this test can be used) for the duration of the COVID-19 declaration under Section 564(b)(1) of the Act, 21 U.S.C. section 360bbb-3(b)(1), unless the authorization is terminated or revoked.  Performed at [facility], [address] [place], [place] [address]   Blood Culture (routine x 2)     Status: None   Collection Time: 09/17/24  8:44 PM   Specimen: BLOOD  Result Value Ref Range Status   Specimen Description BLOOD BLOOD RIGHT HAND  Final   Special Requests   Final    BOTTLES DRAWN AEROBIC AND ANAEROBIC Blood Culture adequate volume   Culture   Final    NO GROWTH 5 DAYS Performed at [facility], [address] [place], [place] [address]    Report Status 09/22/2024 FINAL  Final    Labs: CBC: Recent Labs  Lab 09/17/24 1855 09/18/24 0830 09/19/24 0518 09/20/24 0533 09/21/24 0505 09/22/24 0344  WBC 6.2 6.7 6.2 3.6* 4.9 5.6  NEUTROABS 4.9  --   --   --   --   --   HGB 10.0* 9.5* 9.2* 9.1* 8.4* 9.4*  HCT 33.5* 32.0* 30.7* 30.1* 27.8* 31.4*  MCV 86.8 87.9 84.6 84.1 85.0 84.2  PLT 210 164 223 158 161 181   Basic Metabolic Panel: Recent Labs  Lab 09/18/24 0830 09/19/24 0518 09/20/24 0533 09/21/24 0505 09/22/24 0344  NA 142 140 144 143 143  K 5.2* 4.3 4.2 4.3 5.2*  CL 102 99 99 96* 93*  CO2 33* 35* 40* 42* >45*  GLUCOSE 205* 143* 166* 114* 143*  BUN 32* 36* 27* 22 21  CREATININE 0.77 0.78 0.70 0.59 0.67  CALCIUM  10.6* 11.4* 11.4* 10.7* 10.8*   Liver Function  Tests: Recent Labs  Lab 09/17/24 1855  AST 34  ALT 24  ALKPHOS 83  BILITOT 0.3  PROT 5.8*  ALBUMIN 3.5   CBG: Recent Labs  Lab 09/21/24 1608 09/21/24 2028 09/22/24 0657 09/22/24 0826 09/22/24 1150  GLUCAP 158* 265* 94 115* 123*    Discharge time spent: greater than 30 minutes.  Signed: Cresencio Fairly, MD [facility] 09/22/2024

## 2024-09-22 NOTE — Anesthesia Postprocedure Evaluation (Signed)
 Anesthesia Post Note  Patient: Michele House  Procedure(s) Performed: BRONCHOSCOPY, WITH BIOPSY (Left)  Patient location during evaluation: PACU Anesthesia Type: General Level of consciousness: awake and alert Pain management: pain level controlled Vital Signs Assessment: post-procedure vital signs reviewed and stable Respiratory status: spontaneous breathing, nonlabored ventilation, respiratory function stable and patient connected to nasal cannula oxygen Cardiovascular status: blood pressure returned to baseline and stable Postop Assessment: no apparent nausea or vomiting Anesthetic complications: no   No notable events documented.   Last Vitals:  Vitals:   09/22/24 0915 09/22/24 0947  BP: (!) 103/42 118/63  Pulse: 78 79  Resp: 17   Temp:  36.6 C  SpO2: 93% 95%    Last Pain:  Vitals:   09/22/24 0900  TempSrc:   PainSc: 0-No pain                 Shau-Shau Melia

## 2024-09-22 NOTE — TOC Transition Note (Signed)
 Transition of Care [facility]) - Discharge Note   Patient Details  Name: Michele House MRN: [id number] Date of Birth: December 12, 1955  [facility]) CM/SW Contact:  Racheal LITTIE Schimke, RN Phone Number: 09/22/2024, 11:42 AM    Final next level of care: Skilled Nursing Facility Barriers to Discharge: Barriers Resolved   Patient Goals and CMS Choice Patient states their goals for this hospitalization and ongoing recovery are:: Return to [facility]          Discharge Placement                Patient to be transferred to facility by: [facility]   Patient and family notified of of transfer: 09/22/24  Discharge Plan and Services Additional resources added to the After Visit Summary for       Post Acute Care Choice: Resumption of Svcs/PTA Provider          DME Arranged: N/A DME Agency: NA       HH Arranged: NA HH Agency: NA        Social Drivers of Health (SDOH) Interventions SDOH Screenings   Food Insecurity: Patient Unable To Answer (09/18/2024)  Housing: Unknown (09/18/2024)  Transportation Needs: Patient Unable To Answer (09/18/2024)  Utilities: Patient Unable To Answer (09/18/2024)  Depression (PHQ2-9): Low Risk  (08/11/2024)  Social Connections: Patient Unable To Answer (09/18/2024)  Recent Concern: Social Connections - Socially Isolated (09/01/2024)  Tobacco Use: Medium Risk (09/22/2024)     Readmission Risk Interventions     No data to display

## 2024-09-22 NOTE — Progress Notes (Signed)
 PULMONOLOGY         Date: 09/22/2024,   MRN# [id number] Michele House 1956-02-13     AdmissionWeight: 70.2 kg                 CurrentWeight: 70.2 kg  Referring provider: Acute on chr   CHIEF COMPLAINT:   COPD with complete atelectasis of left lower lobe  HISTORY OF PRESENT ILLNESS   This is a pleasant 68 year old female with history of diabetes morbid obesity CVA advanced systolic CHF and COPD with chronic hypoxemia essential hypertension dyslipidemia and thyroid  dysfunction.  Diagnosis of non-small cell lung cancer of the right upper lobe and February 2025.  Patient reports being in her usual state of health comes in from [facility] with worsening dyspnea and hypoxemia she required 8 L/min supplemental oxygen in the field by EMS.  Reports fatigue and progressive hypoxemia.  She had chest imaging performed in the emergency department showing previously noted right upper lobe lung mass as well as left lung atelectasis.  She was admitted to hospitalist service with empiric therapy for community-acquired pneumonia receiving IV Rocephin  and IV Zithromax  as well as 2.5 L of IV normal saline resuscitation.  During my interview she reports having normal bowel pattern without diarrhea, able to eat with assistance.  Denies have any fevers or sick contacts.  PCCM consultation placed for additional management evaluation of complete atelectasis of left lower lobe.   09/19/24- patient seen at bedside. She received hypertonic saline and mucomyst  overnight with minimal expectoration of phlegm.  She has a weak cough and overall quite deconditioned due to active lung cancer of right upper lobe.  Her CBC is stable without leukocytosis.  Procalcitonin is mildly elevated.  Viral workup is negative thus far.  Blood cultures are too young to read.  Will continue empiric Therapy and bronchopulmonary hygiene with Mucomyst  and hypertonic saline and perform bronchoscopy within the next few  days.  09/20/24- patient seen at bedside.  She still struggles to bring up phlegm/mucus with significant dyspnea and hypoxemia currenlty on 9L/min.  We plan to take her for bronchoscopy with therpeutic aspiration of tracheobronchial tree on Friday morning 730am to get her airways cleared and evaluate for any new malignant endobronchial lesion. She is in no distress but is dyspneic at rest.    09/21/24- patient seen at bedside. She's having nebulizer treatment. She reports she wants fulls cope of care still.  She discussed palliative visit with me and states she does not wish to modify her care from full scope of care.   09/22/24- patient seen at bedside.  No new events overnight. Plan to perform bronchoscopy today.  Likely able to dc post procedure with improved oxygenation.  PAST MEDICAL HISTORY   Past Medical History:  Diagnosis Date   Acid reflux    COPD (chronic obstructive pulmonary disease) (HCC)    Diabetes mellitus without complication (HCC)    Hyperlipidemia    Hypertension    Stroke (HCC)    Thyroid  disease      SURGICAL HISTORY   Past Surgical History:  Procedure Laterality Date   appendectomy     APPENDECTOMY     BREAST BIOPSY Right    CORE W/CLIP - NEG   ESOPHAGOGASTRODUODENOSCOPY N/A 10/28/2023   Procedure: ESOPHAGOGASTRODUODENOSCOPY (EGD);  Surgeon: Jinny Carmine, MD;  Location: [facility] ENDOSCOPY;  Service: Endoscopy;  Laterality: N/A;   FLEXIBLE BRONCHOSCOPY Bilateral 11/12/2023   Procedure: FLEXIBLE BRONCHOSCOPY;  Surgeon: Parris Manna, MD;  Location: [facility]  ORS;  Service: Thoracic;  Laterality: Bilateral;   IR EMBO ART  VEN HEMORR LYMPH EXTRAV  INC GUIDE ROADMAPPING  10/30/2023   IR IMAGING GUIDED PORT INSERTION  01/18/2024   IVC FILTER INSERTION N/A 11/09/2023   Procedure: IVC FILTER INSERTION;  Surgeon: Marea Selinda RAMAN, MD;  Location: [facility] INVASIVE CV LAB;  Service: Cardiovascular;  Laterality: N/A;   TOTAL VAGINAL HYSTERECTOMY     TUMOR REMOVAL     benign;stomach    VIDEO BRONCHOSCOPY WITH ENDOBRONCHIAL NAVIGATION Right 10/20/2023   Procedure: VIDEO BRONCHOSCOPY WITH ENDOBRONCHIAL NAVIGATION;  Surgeon: Parris Manna, MD;  Location: [facility] ORS;  Service: Thoracic;  Laterality: Right;   VIDEO BRONCHOSCOPY WITH ENDOBRONCHIAL NAVIGATION Right 11/12/2023   Procedure: VIDEO BRONCHOSCOPY WITH ENDOBRONCHIAL NAVIGATION;  Surgeon: Parris Manna, MD;  Location: [facility] ORS;  Service: Thoracic;  Laterality: Right;     FAMILY HISTORY   Family History  Problem Relation Age of Onset   Heart attack Mother    Hypertension Mother    Breast cancer Sister 37   Breast cancer Maternal Aunt        40'S   Breast cancer Maternal Grandmother      SOCIAL HISTORY   Social History   Tobacco Use   Smoking status: Former    Current packs/day: 0.00    Types: Cigarettes    Quit date: 12/15/1985    Years since quitting: 38.7   Smokeless tobacco: Never  Substance Use Topics   Alcohol use: No   Drug use: No     MEDICATIONS    Home Medication:     Current Medication:  Current Facility-Administered Medications:    [MAR Hold] (feeding supplement) PROSource Plus liquid 30 mL, 30 mL, Oral, BID BM, Mansy, Jan A, MD, 30 mL at 09/21/24 1320   [MAR Hold] acetaminophen  (TYLENOL ) tablet 650 mg, 650 mg, Oral, Q6H PRN **OR** [MAR Hold] acetaminophen  (TYLENOL ) suppository 650 mg, 650 mg, Rectal, Q6H PRN, Ponnala, Shruthi, MD   [MAR Hold] acetylcysteine  (MUCOMYST ) 20 % nebulizer / oral solution 4 mL, 4 mL, Nebulization, BID, Zainah Steven, MD, 4 mL at 09/21/24 1935   [MAR Hold] alum & mag hydroxide-simeth (MAALOX/MYLANTA) 200-200-20 MG/5ML suspension 20 mL, 20 mL, Oral, Q6H PRN, Mansy, Jan A, MD   [MAR Hold] aspirin  EC tablet 81 mg, 81 mg, Oral, Daily, Mansy, Jan A, MD, 81 mg at 09/21/24 0919   [facility] Hold] atorvastatin  (LIPITOR) tablet 20 mg, 20 mg, Oral, Daily, Mansy, Jan A, MD, 20 mg at 09/21/24 0919   [facility] Hold] busPIRone  (BUSPAR ) tablet 20 mg, 20 mg, Oral, BID, Mansy,  Jan A, MD, 20 mg at 09/21/24 2048   [facility] Hold] calcium  carbonate (TUMS - dosed in mg elemental calcium ) chewable tablet 200 mg of elemental calcium , 1 tablet, Oral, Daily, Mansy, Jan A, MD, 200 mg of elemental calcium  at 09/21/24 0920   [MAR Hold] cefUROXime  (CEFTIN ) tablet 500 mg, 500 mg, Oral, BID WC, Tayven Renteria, MD, 500 mg at 09/21/24 1721   [MAR Hold] chlorpheniramine-HYDROcodone  (TUSSIONEX) 10-8 MG/5ML suspension 5 mL, 5 mL, Oral, Q12H PRN, Mansy, Jan A, MD   [facility] Hold] collagenase  (SANTYL ) ointment 1 Application, 1 Application, Topical, Daily, Mansy, Jan A, MD, 1 Application at 09/21/24 0921   [facility] Hold] enoxaparin  (LOVENOX ) injection 40 mg, 40 mg, Subcutaneous, Q24H, Mansy, Jan A, MD, 40 mg at 09/21/24 0921   [facility] Hold] feeding supplement (GLUCERNA SHAKE) (GLUCERNA SHAKE) liquid 237 mL, 237 mL, Oral, BID BM, Mansy, Jan A, MD, 237 mL at 09/21/24  1319   [MAR Hold] ferrous sulfate  tablet 325 mg, 325 mg, Oral, BID WC, Mansy, Jan A, MD, 325 mg at 09/21/24 1721   [MAR Hold] furosemide  (LASIX ) tablet 20 mg, 20 mg, Oral, Daily, Ponnala, Shruthi, MD, 20 mg at 09/21/24 0919   [facility] Hold] insulin  aspart (novoLOG ) injection 0-20 Units, 0-20 Units, Subcutaneous, TID WC, Mansy, Madison LABOR, MD, 4 Units at 09/21/24 1722   [MAR Hold] insulin  aspart (novoLOG ) injection 0-5 Units, 0-5 Units, Subcutaneous, QHS, Mansy, Jan A, MD, 3 Units at 09/21/24 2038   [facility] Hold] insulin  aspart (novoLOG ) injection 4 Units, 4 Units, Subcutaneous, TID WC, Mansy, Madison LABOR, MD, 4 Units at 09/21/24 1722   [MAR Hold] insulin  glargine-yfgn (SEMGLEE ) injection 18 Units, 18 Units, Subcutaneous, Daily, Mansy, Jan A, MD, 18 Units at 09/21/24 0917   [facility] Hold] ipratropium-albuterol  (DUONEB) 0.5-2.5 (3) MG/3ML nebulizer solution 3 mL, 3 mL, Nebulization, Q4H PRN, Belue, Rankin RAMAN, RPH, 3 mL at 09/22/24 0726   [MAR Hold] ipratropium-albuterol  (DUONEB) 0.5-2.5 (3) MG/3ML nebulizer solution 3 mL, 3 mL, Nebulization, BID, Maree, Vipul, MD, 3 mL at  09/21/24 1935   ipratropium-albuterol  (DUONEB) 0.5-2.5 (3) MG/3ML nebulizer solution 3 mL, 3 mL, Nebulization, Once, Melia Deacon, MD   [facility] Hold] levETIRAcetam  (KEPPRA ) tablet 500 mg, 500 mg, Oral, BID, Mansy, Jan A, MD, 500 mg at 09/21/24 2048   [facility] Hold] levothyroxine  (SYNTHROID ) tablet 125 mcg, 125 mcg, Oral, Q0600, Mansy, Jan A, MD, 125 mcg at 09/21/24 0512   [facility] Hold] lidocaine  (LIDODERM ) 5 % 1 patch, 1 patch, Transdermal, Q24H, Mansy, Jan A, MD   [MAR Hold] linagliptin  (TRADJENTA ) tablet 5 mg, 5 mg, Oral, Daily, Mansy, Jan A, MD, 5 mg at 09/21/24 0919   [facility] Hold] magnesium  hydroxide (MILK OF MAGNESIA) suspension 30 mL, 30 mL, Oral, Daily PRN, Mansy, Madison LABOR, MD   [facility] Hold] methylPREDNISolone  sodium succinate (SOLU-MEDROL ) 125 mg/2 mL injection 60 mg, 60 mg, Intravenous, Q24H, Seab Axel, MD, 60 mg at 09/21/24 9077   [facility] Hold] metoprolol  succinate (TOPROL -XL) 24 hr tablet 50 mg, 50 mg, Oral, Daily, Mansy, Jan A, MD, 50 mg at 09/21/24 0920   [MAR Hold] midodrine  (PROAMATINE ) tablet 10 mg, 10 mg, Oral, TID PRN, Ponnala, Shruthi, MD   [MAR Hold] multivitamin with minerals tablet 1 tablet, 1 tablet, Oral, Daily, Mansy, Jan A, MD, 1 tablet at 09/21/24 0919   [facility] Hold] nutrition supplement (JUVEN) (JUVEN) powder packet 1 packet, 1 packet, Oral, BID BM, Mansy, Jan A, MD, 1 packet at 09/21/24 1319   [MAR Hold] nystatin  (MYCOSTATIN /NYSTOP ) topical powder 1 Application, 1 Application, Topical, BID, Mansy, Jan A, MD, 1 Application at 09/21/24 2049   [facility] Hold] ondansetron  (ZOFRAN ) tablet 4 mg, 4 mg, Oral, Q6H PRN **OR** [MAR Hold] ondansetron  (ZOFRAN ) injection 4 mg, 4 mg, Intravenous, Q6H PRN, Mansy, Jan A, MD   [MAR Hold] pantoprazole  (PROTONIX ) EC tablet 40 mg, 40 mg, Oral, BID AC, Mansy, Jan A, MD, 40 mg at 09/21/24 1721   [MAR Hold] polyethylene glycol (MIRALAX  / GLYCOLAX ) packet 17 g, 17 g, Oral, Daily, Mansy, Jan A, MD, 17 g at 09/21/24 9076   [facility] Hold] pregabalin  (LYRICA ) capsule 50  mg, 50 mg, Oral, BID, Mansy, Jan A, MD, 50 mg at 09/21/24 2048   [facility] Hold] rOPINIRole  (REQUIP  XL) 24 hr tablet 4 mg, 4 mg, Oral, QHS, Mansy, Jan A, MD, 4 mg at 09/21/24 2048   [facility] Hold] senna (SENOKOT) tablet 8.6 mg, 1 tablet, Oral, BID, Mansy, Madison LABOR, MD,  8.6 mg at 09/21/24 2048   [facility] Hold] sertraline  (ZOLOFT ) tablet 100 mg, 100 mg, Oral, Daily, Ponnala, Shruthi, MD, 100 mg at 09/21/24 0920   [MAR Hold] spironolactone  (ALDACTONE ) tablet 25 mg, 25 mg, Oral, Daily, Ponnala, Shruthi, MD, 25 mg at 09/21/24 0920   [MAR Hold] traZODone  (DESYREL ) tablet 25 mg, 25 mg, Oral, QHS PRN, Mansy, Madison LABOR, MD   [facility] Hold] zinc  oxide 20 % ointment 1 Application, 1 Application, Topical, BID, Mansy, Jan A, MD, 1 Application at 09/21/24 2053  Facility-Administered Medications Ordered in Other Encounters:    oxyCODONE  (Oxy IR/ROXICODONE ) immediate release tablet 5 mg, 5 mg, Oral, Once, Agrawal, Kavita, MD    ALLERGIES   Penicillins, Aspirin , and Sulfa antibiotics     REVIEW OF SYSTEMS    Review of Systems:  Gen:  Denies  fever, sweats, chills weigh loss  HEENT: Denies blurred vision, double vision, ear pain, eye pain, hearing loss, nose bleeds, sore throat Cardiac:  No dizziness, chest pain or heaviness, chest tightness,edema Resp:   reports dyspnea chronically  Gi: Denies swallowing difficulty, stomach pain, nausea or vomiting, diarrhea, constipation, bowel incontinence Gu:  Denies bladder incontinence, burning urine Ext:   Denies Joint pain, stiffness or swelling Skin: Denies  skin rash, easy bruising or bleeding or hives Endoc:  Denies polyuria, polydipsia , polyphagia or weight change Psych:   Denies depression, insomnia or hallucinations   Other:  All other systems negative   VS: BP 134/68   Pulse 74   Temp 97.6 F (36.4 C) (Temporal)   Resp 17   Ht 5' 1 (1.549 m)   Wt 70.2 kg   LMP 12/24/1998 Comment: prior to her hysterectomy  SpO2 98%   BMI 29.24 kg/m      PHYSICAL EXAM     GENERAL:NAD, no fevers, chills, no weakness no fatigue HEAD: Normocephalic, atraumatic.  EYES: Pupils equal, round, reactive to light. Extraocular muscles intact. No scleral icterus.  MOUTH: Moist mucosal membrane. Dentition intact. No abscess noted.  EAR, NOSE, THROAT: Clear without exudates. No external lesions.  NECK: Supple. No thyromegaly. No nodules. No JVD.  PULMONARY: decreased breath sounds with mild rhonchi worse at bases bilaterally.  CARDIOVASCULAR: S1 and S2. Regular rate and rhythm. No murmurs, rubs, or gallops. No edema. Pedal pulses 2+ bilaterally.  GASTROINTESTINAL: Soft, nontender, nondistended. No masses. Positive bowel sounds. No hepatosplenomegaly.  MUSCULOSKELETAL: No swelling, clubbing, or edema. Range of motion full in all extremities.  NEUROLOGIC: Cranial nerves II through XII are intact. No gross focal neurological deficits. Sensation intact. Reflexes intact.  SKIN: No ulceration, lesions, rashes, or cyanosis. Skin warm and dry. Turgor intact.  PSYCHIATRIC: Mood, affect within normal limits. The patient is awake, alert and oriented x 3. Insight, judgment intact.       IMAGING     Narrative & Impression  EXAM: CTA CHEST 09/17/2024 10:07:39 PM   TECHNIQUE: CTA of the chest was performed after the administration of intravenous contrast. Multiplanar reformatted images are provided for review. MIP images are provided for review. Automated exposure control, iterative reconstruction, and/or weight based adjustment of the mA/kV was utilized to reduce the radiation dose to as low as reasonably achievable.   COMPARISON: None available.   CLINICAL HISTORY: Lung cancer patient with right upper lobe neoplasm presenting with hypoxemia and respiratory distress. Pulmonary embolism (PE) suspected, high prob.   FINDINGS:   PULMONARY ARTERIES: Pulmonary arteries are adequately opacified for evaluation. The pulmonary arteries are slightly prominent but no  arterial embolus  is seen.   MEDIASTINUM: There is mild cardiomegaly with a slight right chamber predominance with RV/LV ratio of 1.06. There is no pericardial effusion. There is atherosclerosis in the aorta, great vessels, small amount in the left main and proximal LAD coronary artery. No aortic dissection or aneurysm.   The pulmonary veins are nondilated. There is mild chronic enlargement and heterogeneity of the thyroid  gland without a dominant nodule.   There is a chronically patulous esophagus without wall thickening. IVC filter in place. Left chest mediport with IJ approach catheter terminating in the SVC.   LYMPH NODES: Subcarinal adenopathy is unchanged up to 1.2 cm on axial 58 of series 6, with increased prominence of AP window and left hilar nodes up to 1.2 cm, increased prominence of right hilar nodes up to 1.5 cm on axial 67.   LUNGS AND PLEURA: Lungs are emphysematous. There is mild layering fluid in the distal trachea. There is layering fluid in the bilateral main bronchi, on the left extending into and obstructing the main lower lobe and lower lobe segmental bronchi, with consolidation throughout the left lower lobe consistent with a consolidated pneumonia.   There is a right upper lobe suprahilar mass encasing central bronchovascular structures with bronchial narrowing.   It is difficult to distinguish between the mass and adjacent pulmonary consolidation. In total, the mass and consolidation today measure 5 x 6.6 cm on axial 47 of series 6, not grossly changed from the PET CT but certainly significantly larger than on 05/24/2024. There is surrounding spiculation and stranding to pleural surfaces and areas of pleural thickening in the right chest superiorly and medially probably due to pleural metastatic disease and hypermetabolic on the PET CT.   There is a small layering right pleural effusion with partial loculation in the superior chest. In the right lower  lobe, several coarsely nodular opacities are again noted, consistent with patchy airspace disease versus intraorgan metastases.   The largest of these is 2.3 x 1.9 cm on series 8 axial 82, unchanged.   Elsewhere, there is progressive diffuse interstitial thickening and micronodularity relatively sparing the right middle lobe and first seen on PET CT but worsened since.   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 No pneumothorax.   UPPER ABDOMEN: The gallbladder is dilated and there are tiny layering stones posteriorly without wall thickening.   There are stones in the right renal collecting system and renal pelvis, largest is 6 mm just above the UPJ. There is mild right hydronephrosis.   The liver is steatotic. There is a 2 cm Bosniak 1 cyst in the right upper pole.   SOFT TISSUES AND BONES: There is osteopenia and kyphosis of the thoracic spine. No destructive bone lesions. No acute soft tissue abnormality.   IMPRESSION: 1. No evidence of pulmonary embolism. 2. Left lower lobe consolidation with mucus impaction and complete obstruction of the left lower lobe bronchus, consistent with pneumonia. 3. Cardiomegaly with right chamber predominance indicating right heart dysfunction. the pulmonary arteries are slightly prominent. 4. Fluid in the main bronchi, obstructing the left lower lobe main and segmental bronchi with complete consolidation/ atelectasis of the left lower lobe. 5. Right upper lobe suprahilar mass with bronchovascular encasement and bronchial narrowing, overall size approximately 5 x 6.6 cm, not grossly changed from recent PET CT but increased from 05/24/2024, with associated pleural thickening suspicious for pleural metastatic disease and small partially loculated right pleural effusion . 6. Progressive diffuse interstitial  thickening and micronodularity, most consistent with  lymphangitic carcinomatosis. 7. Mediastinal and hilar adenopathy with interval increase in AP window and bilateral hilar nodes up to 1.5 cm and stable subcarinal node up to 1.2 cm. 8. Right nephrolithiasis including a 6 mm renal pelvis just above the UPJ with mild right hydronephrosis. 9. Emphysema, with aortic and coronary atherosclerosis.   Electronically signed by: Francis Quam MD 09/17/2024 10:53 PM EST RP Workstation: [id number]      Result History    ASSESSMENT/PLAN   Complete atelectasis of left lower lobe Patient with known advanced COPD and non-small cell lung cancer , Initially will attempt noninvasive modalities with Mucomyst  nebulized solution 20% 4 mL twice daily - Incentive spirometry and flutter valve to be used by patient multiple times each hour  -Discussed care plan with RT today- -patient receiving hypertonic saline nebulized for bronchopulmonary hygiene -patient has received 2 treatments of Mucomyst  over the last 24 hours with minimal improvement we will consider bronchoscopy during this hospitalization   Community-acquired pneumonia of left lung -have narrowed regimen to PO antibiotics    Advanced COPD with chronic hypoxemia Continue nebulizer therapy as needed -Solu-Medrol  is been reduced from 80 mg to 60 IV daily   Morbid obesity and physical deconditioning - Contributing to atelectasis, recommendation for PT OT as soon as able    Thank you for allowing me to participate in the care of this patient.   Patient/Family are satisfied with care plan and all questions have been answered.    Provider disclosure: Patient with at least one acute or chronic illness or injury that poses a threat to life or bodily function and is being managed actively during this encounter.  All of the below services have been performed independently by signing provider:  review of prior documentation from internal and or external health records.  Review of previous and  current lab results.  Interview and comprehensive assessment during patient visit today. Review of current and previous chest radiographs/CT scans. Discussion of management and test interpretation with health care team and patient/family.   This document was prepared using Dragon voice recognition software and may include unintentional dictation errors.     Gerene Nedd, M.D.  Division of Pulmonary & Critical Care Medicine

## 2024-09-22 NOTE — Procedures (Signed)
 PROCEDURE: BRONCHOSCOPY Therapeutic Aspiration of Tracheobronchial Tree  PROCEDURE DATE: 09/22/2024  TIME:  NAME:  Michele House  DOB:[birth year]  MRN: [id number] LOC:  ARPO/None    HOSP DAY: @LENGTHOFSTAYDAYS @ CODE STATUS:      Code Status Orders  (From admission, onward)           Start     Ordered   09/17/24 2336  Full code  Continuous       Question:  By:  Answer:  Consent: discussion documented in EHR   09/17/24 2337           Code Status History     Date Active Date Inactive Code Status Order ID Comments User Context   09/01/2024 1100 09/05/2024 0241 Full Code [id number]  Laurita Cort DASEN, MD ED   01/18/2024 1124 01/19/2024 0518 Full Code [id number]  Luverne Aran, MD HOV   10/31/2023 1455 12/03/2023 0010 Do not attempt resuscitation (DNR) PRE-ARREST INTERVENTIONS DESIRED [id number]  Arloa Waddell RAMAN, NP Inpatient   10/02/2023 0738 10/31/2023 1455 Full Code [id number]  Eldonna Elspeth PARAS, MD ED   09/23/2023 2025 09/26/2023 2320 Full Code [id number]  Cleatus Delayne GAILS, MD ED   06/07/2023 1758 06/08/2023 2020 Full Code [id number]  Eldonna Elspeth PARAS, MD ED      Advance Directive Documentation    Flowsheet Row Most Recent Value  Type of Advance Directive Healthcare Power of Attorney  Pre-existing out of facility DNR order (yellow form or pink MOST form) --  MOST Form in Place? --       Media Information  Bilateral severe mucus impaction starting from carina down    Media Information  Left lung with severe mucus plugging requiring multiple therapeutic aspirations   Indications/Preliminary Diagnosis:   Consent: (Place X beside choice/s below)  The benefits, risks and possible complications of the procedure were        explained to:  _X__ patient  ___ patient's family  ___ other:___________  who verbalized understanding and gave:  ___ verbal  __X_ written  ___ verbal and written  ___ telephone  ___ other:________ consent.      Unable to obtain consent; procedure  performed on emergent basis.     Other:       PRESEDATION ASSESSMENT: History and Physical has been performed. Patient meds and allergies have been reviewed. Presedation airway examination has been performed and documented. Baseline vital signs, sedation score, oxygenation status, and cardiac rhythm were reviewed. Patient was deemed to be in satisfactory condition to undergo the procedure.     PROCEDURE DETAILS: Timeout performed and correct patient, name, & ID confirmed. Following prep per Pulmonary policy, appropriate sedation was administered. The Bronchoscope was inserted in to oral cavity with bite block in place. Therapeutic aspiration of Tracheobronchial tree was performed.  Airway exam proceeded with findings, technical procedures, and specimen collection as noted below. At the end of exam the scope was withdrawn without incident. Impression and Plan as noted below.       Insertion Route (Place X beside choice below)   Nasal   Oral  x Endotracheal Tube   Tracheostomy   INTRAPROCEDURE MEDICATIONS: As per anesthesia  TECHNICAL PROCEDURES: (Place X beside choice below)   Procedures  Description    None     Electrocautery     Cryotherapy     Balloon Dilatation     Bronchography     Stent Placement   x  Therapeutic Aspiration Bilateral worse on left     Laser/Argon  Plasma    Brachytherapy Catheter Placement    Foreign Body Removal         SPECIMENS (Sites): (Place X beside choice below)  Specimens Description   No Specimens Obtained     Washings   x Lavage Left lower lobe for microbiology   Biopsies    Fine Needle Aspirates    Brushings    Sputum    FINDINGS:  severe mucus plugging as outlined with pictorial documentation above  ESTIMATED BLOOD LOSS: none COMPLICATIONS/RESOLUTION: none  RECOMMENDATION/PLAN:   -awaiting microbiology cultures    Georgianna Band, M.D.  Pulmonary & Critical Care Medicine  [facility]

## 2024-09-23 ENCOUNTER — Encounter: Payer: Self-pay | Admitting: Pulmonary Disease

## 2024-09-25 LAB — CULTURE, BAL-QUANTITATIVE W GRAM STAIN: Culture: >=100000 — AB

## 2024-09-25 NOTE — Progress Notes (Signed)
 Speech Language Pathology Treatment: Dysphagia  Patient Details Name: Michele House MRN: [id number] DOB: 1956-08-13 Today's Date: 09/25/2024 Time: 0920-0950 SLP Time Calculation (min) (ACUTE ONLY): 30 min  Assessment / Plan / Recommendation Clinical Impression  Pt seen for ongoing assessment of swallowing and toleration of diet. Pt awake, verbally engaged but talkative and was often easily distracted. She redirected attention to tasks w/ verbal cues. Pt followed instructions w/ cues. She appeared easily SOB w/ any exertion including talking. Missing Most Dentition.   Unsure of pt's Cognitive status/function in setting of Cancer/txs. Recommend further f/u w/ Neurology if indicated.  On [place] O2 support 4L, afebrile. WBC WNL.   Pt appears to present w/ grossly functional oropharyngeal phase swallowing w/ No overt oropharyngeal phase dysphagia noted, No neuromuscular deficits noted. However, pt is easily SOB w/ ANY exertion and is Missing Many Dentition impacting her ability to effectively masticate solid foods. Pt consumed po trials w/ No immediate, overt clinical s/s of aspiration during po trials when following general aspiration precautions and taking Rest Breaks to lessen WOB/SOB from exertion. Pt was also educated on Less Talking during oral intake for conservation of energy.   Pt does have challenging factors that could impact her oropharyngeal swallowing to include declined Pulmonary status- Chronic O2 need w/ quick WOB/SOB w/ ANY exertion including talking, deconditioning/fatigue/weakness, weak RUE hampering her self-feeding abilities(need for setup and support w/ meals d/t UE weakness), and Missing Many Dentition. Also noted mild distraction/confusion Cognitive presentation. These factors can increase risk for aspiration, dysphagia as well as decreased oral intake overall.    During po trials, pt consumed all consistencies w/ no immediate, overt coughing, decline in vocal quality, or change  in respiratory presentation during/post trials. Rest Breaks taken b/t trials when needed- pt admitted she needed to do less talking when I eat. Oral phase appeared grossly [facility] w/ timely bolus management and control of bolus propulsion for A-P transfer for swallowing w/ liquids. Min increased mastication/oral phase time needed for trials of increased texture- increased mastication/mashing time. Oral clearing achieved w/ all trial consistencies given time -- moistened, cut/chopped foods given.  Pt fed self w/ FULL setup support.    Recommend continue a fairly Regular>Mech Soft consistency diet w/ well-Cut and/or Chopped meats, moistened foods; Thin liquids -- carefully monitor straw use, and pt should Hold Cup when drinking. Recommend general aspiration precautions including Rest Breaks during meals for conservation of energy. Avoid overly tough and chewy foods d/t exertion. Recommend FULL tray setup and support w/ eating at meals d/t using only the LUE to self-feed- OT f/u to address and suggest options. Small sips/bites slowly. Sit FULLY upright for all oral intake.  Pills 1-3 at a time w/ Water  vs WHOLE in Puree if needed for safer, easier swallowing -- pt practiced this during this session.    Education given on Pills in Puree; food consistencies and easy to eat options; general aspiration precautions and the above to pt and NSG. ST services can be available if any further needs while admitted. NSG updated, agreed. MD updated. Recommend Dietician f/u for support. Recommend Palliative Care consult for GOC especially addressing polypharmacy and code status. Precautions posted in chart, room.       HPI HPI: Pt is a 68 y.o. female with medical history significant of COPD Gold stage III status post chemo and radiation therapy, on maintenance immune therapy, chronic hypoxic respiratory failure on 2 L continuously, RUL stage III non-small cell Lung Cancer status post chronic HFrEF with  LVEF 30% July 2025,  HTN, obesity, HLD, IDDM, hypothyroidism, diabetic neuropathy, anxiety/depression.  She presented to the emergency room from [facility] with acute onset of abnormal CO2 41.  This has been coinciding with worsening dyspnea and hypoxia with a pulse currently of 85% on 4 L O2 by nasal cannula.  She was admitted here from 10/31 till 09/04/2024 for management of COPD exacerbation and acute on chronic HFrEF with acute on chronic hypoxic and hypercarbic respiratory failure.  Per pt and PT report, pt is essentially bed bound at baseline and requires hoyer lift transfers to sit up. She does not get out of bed routinely. When she does get to the wheelchair, she cannot sit longer than a few minutes per her report. She needs assistance for ADLs as well.   CT of Chest: Lungs are emphysematous. There is mild layering fluid in the distal trachea.  There is layering fluid in the bilateral main bronchi, on the left extending  into and obstructing the main lower lobe and lower lobe segmental bronchi, with  consolidation throughout the left lower lobe consistent with a consolidated  pneumonia.  There is a right upper lobe suprahilar mass encasing central bronchovascular structures with bronchial narrowing. It is difficult to distinguish between the mass and adjacent pulmonary  consolidation. The Right upper lobe suprahilar mass with bronchovascular encasement and bronchial narrowing, overall size approximately 5 x 6.6 cm, not grossly changed  from recent PET CT but increased from 05/24/2024, with associated pleural  thickening suspicious for pleural metastatic disease and small partially  loculated right pleural effusion.    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  See CT for further.      SLP Plan  All goals met          Recommendations  Diet recommendations: Dysphagia 3 (mechanical soft);Thin liquid Liquids provided via: Cup (less straw use  if any coughing) Medication Administration: Whole meds with puree (as needed for easier swallowing) Supervision: Patient able to self feed;Intermittent supervision to cue for compensatory strategies (setup support) Compensations: Minimize environmental distractions;Slow rate;Small sips/bites;Lingual sweep for clearance of pocketing;Follow solids with liquid Postural Changes and/or Swallow Maneuvers: Out of bed for meals;Seated upright 90 degrees;Upright 30-60 min after meal (reflux precs.)                 (Dietician f/u; Palliative Care f/u for GOC) Oral care BID;Patient independent with oral care;Staff/trained caregiver to provide oral care (setup)   Intermittent Supervision/Assistance Dysphagia, unspecified (R13.10)     All goals met       Comer Portugal, MS, CCC-SLP Speech Language Pathologist Rehab Services; [facility] [place] [phone] (ascom) Micai Apolinar  09/25/2024, 4:03 PM

## 2024-10-03 ENCOUNTER — Other Ambulatory Visit: Payer: Self-pay

## 2024-10-04 ENCOUNTER — Other Ambulatory Visit: Payer: Self-pay | Admitting: Pulmonary Disease

## 2024-10-05 ENCOUNTER — Telehealth: Payer: Self-pay | Admitting: Oncology

## 2024-10-05 LAB — ORGANISM IDENTIFICATION, MOLD

## 2024-10-05 LAB — MOLD ORGANISM REFLEX

## 2024-10-05 NOTE — Telephone Encounter (Signed)
 ERROR

## 2024-10-06 ENCOUNTER — Inpatient Hospital Stay: Attending: Internal Medicine

## 2024-10-06 ENCOUNTER — Inpatient Hospital Stay

## 2024-10-06 ENCOUNTER — Encounter: Payer: Self-pay | Admitting: Oncology

## 2024-10-06 ENCOUNTER — Inpatient Hospital Stay: Attending: Internal Medicine | Admitting: Oncology

## 2024-10-06 VITALS — BP 103/78 | HR 100 | Temp 97.9°F | Resp 18 | Ht 61.0 in | Wt 138.9 lb

## 2024-10-06 DIAGNOSIS — Z794 Long term (current) use of insulin: Secondary | ICD-10-CM | POA: Insufficient documentation

## 2024-10-06 DIAGNOSIS — E785 Hyperlipidemia, unspecified: Secondary | ICD-10-CM | POA: Diagnosis not present

## 2024-10-06 DIAGNOSIS — J449 Chronic obstructive pulmonary disease, unspecified: Secondary | ICD-10-CM | POA: Diagnosis not present

## 2024-10-06 DIAGNOSIS — E119 Type 2 diabetes mellitus without complications: Secondary | ICD-10-CM | POA: Diagnosis not present

## 2024-10-06 DIAGNOSIS — K219 Gastro-esophageal reflux disease without esophagitis: Secondary | ICD-10-CM | POA: Insufficient documentation

## 2024-10-06 DIAGNOSIS — Z8673 Personal history of transient ischemic attack (TIA), and cerebral infarction without residual deficits: Secondary | ICD-10-CM | POA: Insufficient documentation

## 2024-10-06 DIAGNOSIS — Z87891 Personal history of nicotine dependence: Secondary | ICD-10-CM | POA: Insufficient documentation

## 2024-10-06 DIAGNOSIS — R079 Chest pain, unspecified: Secondary | ICD-10-CM | POA: Diagnosis not present

## 2024-10-06 DIAGNOSIS — Z803 Family history of malignant neoplasm of breast: Secondary | ICD-10-CM | POA: Insufficient documentation

## 2024-10-06 DIAGNOSIS — G8929 Other chronic pain: Secondary | ICD-10-CM | POA: Diagnosis not present

## 2024-10-06 DIAGNOSIS — Z79899 Other long term (current) drug therapy: Secondary | ICD-10-CM | POA: Diagnosis not present

## 2024-10-06 DIAGNOSIS — Z7984 Long term (current) use of oral hypoglycemic drugs: Secondary | ICD-10-CM | POA: Insufficient documentation

## 2024-10-06 DIAGNOSIS — K5900 Constipation, unspecified: Secondary | ICD-10-CM | POA: Insufficient documentation

## 2024-10-06 DIAGNOSIS — C3411 Malignant neoplasm of upper lobe, right bronchus or lung: Secondary | ICD-10-CM | POA: Insufficient documentation

## 2024-10-06 DIAGNOSIS — I10 Essential (primary) hypertension: Secondary | ICD-10-CM | POA: Diagnosis not present

## 2024-10-06 DIAGNOSIS — Z79890 Hormone replacement therapy (postmenopausal): Secondary | ICD-10-CM | POA: Diagnosis not present

## 2024-10-06 DIAGNOSIS — Z7951 Long term (current) use of inhaled steroids: Secondary | ICD-10-CM | POA: Diagnosis not present

## 2024-10-06 DIAGNOSIS — Z7982 Long term (current) use of aspirin: Secondary | ICD-10-CM | POA: Diagnosis not present

## 2024-10-06 LAB — CBC WITH DIFFERENTIAL (CANCER CENTER ONLY)
Abs Immature Granulocytes: 0.07 K/uL (ref 0.00–0.07)
Basophils Absolute: 0 K/uL (ref 0.0–0.1)
Basophils Relative: 0 %
Eosinophils Absolute: 0 K/uL (ref 0.0–0.5)
Eosinophils Relative: 0 %
HCT: 36.6 % (ref 36.0–46.0)
Hemoglobin: 11.8 g/dL — ABNORMAL LOW (ref 12.0–15.0)
Immature Granulocytes: 1 %
Lymphocytes Relative: 4 %
Lymphs Abs: 0.4 K/uL — ABNORMAL LOW (ref 0.7–4.0)
MCH: 26.6 pg (ref 26.0–34.0)
MCHC: 32.2 g/dL (ref 30.0–36.0)
MCV: 82.6 fL (ref 80.0–100.0)
Monocytes Absolute: 0.8 K/uL (ref 0.1–1.0)
Monocytes Relative: 8 %
Neutro Abs: 9.1 K/uL — ABNORMAL HIGH (ref 1.7–7.7)
Neutrophils Relative %: 87 %
Platelet Count: 202 K/uL (ref 150–400)
RBC: 4.43 MIL/uL (ref 3.87–5.11)
RDW: 16.2 % — ABNORMAL HIGH (ref 11.5–15.5)
WBC Count: 10.3 K/uL (ref 4.0–10.5)
nRBC: 0 % (ref 0.0–0.2)

## 2024-10-06 LAB — FERRITIN: Ferritin: 385 ng/mL — ABNORMAL HIGH (ref 11–307)

## 2024-10-06 LAB — CMP (CANCER CENTER ONLY)
ALT: 12 U/L (ref 0–44)
AST: 15 U/L (ref 15–41)
Albumin: 3.7 g/dL (ref 3.5–5.0)
Alkaline Phosphatase: 103 U/L (ref 38–126)
Anion gap: 12 (ref 5–15)
BUN: 29 mg/dL — ABNORMAL HIGH (ref 8–23)
CO2: 30 mmol/L (ref 22–32)
Calcium: 10.8 mg/dL — ABNORMAL HIGH (ref 8.9–10.3)
Chloride: 98 mmol/L (ref 98–111)
Creatinine: 0.55 mg/dL (ref 0.44–1.00)
GFR, Estimated: >60 mL/min (ref >60)
Glucose, Bld: 250 mg/dL — ABNORMAL HIGH (ref 70–99)
Potassium: 3.9 mmol/L (ref 3.5–5.1)
Sodium: 140 mmol/L (ref 135–145)
Total Bilirubin: 0.3 mg/dL (ref 0.0–1.2)
Total Protein: 6.1 g/dL — ABNORMAL LOW (ref 6.5–8.1)

## 2024-10-06 LAB — IRON AND TIBC
Iron: 31 ug/dL (ref 28–170)
Saturation Ratios: 12 % (ref 10.4–31.8)
TIBC: 265 ug/dL (ref 250–450)
UIBC: 233 ug/dL

## 2024-10-06 LAB — CULTURE, FUNGUS WITHOUT SMEAR

## 2024-10-06 NOTE — Progress Notes (Signed)
Patient is doing ok.

## 2024-10-06 NOTE — Final Progress Note (Signed)
 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 Dr Epifanio (ID) & Pulmo (Dr Parris) aware.

## 2024-10-06 NOTE — Progress Notes (Signed)
 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  INTERVAL HISTORY: Patient returns to clinic today for further evaluation and consideration of cycle 7 of monthly maintenance durvalumab .  She continues to have cough and shortness of breath, but otherwise feels well.  She has multiple medical complaints that are all chronic in nature and unchanged.  She has no neurologic complaints.  She denies any fevers.  She has a good appetite and denies weight loss.  She denies any chest pain or hemoptysis.  She denies any nausea, vomiting, or diarrhea.  She has no urinary complaints.  Patient offers no further specific complaints today.  REVIEW OF SYSTEMS:   Review of Systems  Constitutional:  Positive for malaise/fatigue. Negative for fever and weight loss.  Respiratory:  Positive for cough and shortness of breath. Negative for hemoptysis.   Cardiovascular: Negative.  Negative for chest pain and leg swelling.  Gastrointestinal:  Positive for constipation. Negative for abdominal pain.  Genitourinary: Negative.  Negative for dysuria.  Musculoskeletal:  Positive for back pain and joint pain.  Skin: Negative.  Negative for rash.  Neurological:  Positive for weakness. Negative for dizziness, focal weakness and headaches.  Psychiatric/Behavioral: Negative.  The patient is not nervous/anxious.     As per HPI. Otherwise, a complete review of systems is negative.  PAST MEDICAL HISTORY: Past Medical History:  Diagnosis Date   Acid reflux    COPD (chronic obstructive pulmonary  disease) (HCC)    Diabetes mellitus without complication (HCC)    Hyperlipidemia    Hypertension    Stroke (HCC)    Thyroid  disease     PAST SURGICAL HISTORY: Past Surgical History:  Procedure Laterality Date   appendectomy     APPENDECTOMY     BREAST BIOPSY Right    CORE W/CLIP - NEG   BRONCHIAL BIOPSY Left 09/22/2024   Procedure: BRONCHOSCOPY, WITH BIOPSY;  Surgeon: Parris Manna, MD;  Location: [facility] ORS;  Service: Thoracic;  Laterality: Left;   ESOPHAGOGASTRODUODENOSCOPY N/A 10/28/2023   Procedure: ESOPHAGOGASTRODUODENOSCOPY (EGD);  Surgeon: Jinny Carmine, MD;  Location: [facility] ENDOSCOPY;  Service: Endoscopy;  Laterality: N/A;   FLEXIBLE BRONCHOSCOPY Bilateral 11/12/2023   Procedure: FLEXIBLE BRONCHOSCOPY;  Surgeon: Parris Manna, MD;  Location: [facility] ORS;  Service: Thoracic;  Laterality: Bilateral;   IR EMBO ART  VEN HEMORR LYMPH EXTRAV  INC GUIDE ROADMAPPING  10/30/2023   IR IMAGING GUIDED PORT INSERTION  01/18/2024   IVC FILTER INSERTION N/A 11/09/2023   Procedure: IVC FILTER INSERTION;  Surgeon: Marea Selinda RAMAN, MD;  Location: [facility] INVASIVE CV LAB;  Service: Cardiovascular;  Laterality: N/A;   TOTAL VAGINAL HYSTERECTOMY     TUMOR REMOVAL     benign;stomach   VIDEO BRONCHOSCOPY WITH ENDOBRONCHIAL NAVIGATION Right 10/20/2023   Procedure: VIDEO BRONCHOSCOPY WITH ENDOBRONCHIAL NAVIGATION;  Surgeon: Parris Manna, MD;  Location: [facility] ORS;  Service: Thoracic;  Laterality: Right;   VIDEO BRONCHOSCOPY WITH ENDOBRONCHIAL NAVIGATION Right 11/12/2023   Procedure: VIDEO BRONCHOSCOPY WITH ENDOBRONCHIAL NAVIGATION;  Surgeon: Parris Manna, MD;  Location: [facility] ORS;  Service: Thoracic;  Laterality: Right;    FAMILY HISTORY: Family History  Problem Relation Age of Onset  Heart attack Mother    Hypertension Mother    Breast cancer Sister 70   Breast cancer Maternal Aunt        40'S   Breast cancer Maternal Grandmother     ADVANCED DIRECTIVES (Y/N):  N  HEALTH MAINTENANCE: Social  History   Tobacco Use   Smoking status: Former    Current packs/day: 0.00    Types: Cigarettes    Quit date: 12/15/1985    Years since quitting: 38.8   Smokeless tobacco: Never  Substance Use Topics   Alcohol use: No   Drug use: No     Colonoscopy:  PAP:  Bone density:  Lipid panel:  Allergies  Allergen Reactions   Penicillins Hives   Aspirin  Other (See Comments)    Lips numb   Sulfa Antibiotics     Current Outpatient Medications  Medication Sig Dispense Refill   acetaminophen  (TYLENOL ) 325 MG tablet Take 650 mg by mouth every 6 (six) hours as needed.     aspirin  EC 81 MG tablet Take 81 mg by mouth daily.     atorvastatin  (LIPITOR) 20 MG tablet Take 20 mg by mouth daily.     busPIRone  (BUSPAR ) 10 MG tablet Take 20 mg by mouth 2 (two) times daily.     calcium  carbonate (TUMS - DOSED IN MG ELEMENTAL CALCIUM ) 500 MG chewable tablet Chew 1 tablet by mouth daily.     cetirizine (ZYRTEC) 10 MG tablet Take 10 mg by mouth daily.     collagenase  (SANTYL ) 250 UNIT/GM ointment Apply topically daily. 15 g 0   DULERA 200-5 MCG/ACT AERO Inhale 2 puffs into the lungs every 12 (twelve) hours.     feeding supplement, GLUCERNA SHAKE, (GLUCERNA SHAKE) LIQD Take 237 mLs by mouth 2 (two) times daily between meals. 237 mL 0   ferrous sulfate  325 (65 FE) MG tablet Take 1 tablet (325 mg total) by mouth daily with breakfast. 60 tablet 3   furosemide  (LASIX ) 20 MG tablet Take 1 tablet (20 mg total) by mouth daily. 30 tablet 0   glipiZIDE  (GLUCOTROL ) 5 MG tablet Take 5 mg by mouth daily before breakfast.     insulin  aspart (NOVOLOG ) 100 UNIT/ML injection Inject 4 Units into the skin 3 (three) times daily with meals.     insulin  glargine-yfgn (SEMGLEE ) 100 UNIT/ML injection Inject 0.18 mLs (18 Units total) into the skin daily.     ipratropium (ATROVENT HFA) 17 MCG/ACT inhaler Inhale 2 puffs into the lungs every 6 (six) hours as needed.     ipratropium-albuterol  (DUONEB) 0.5-2.5 (3) MG/3ML SOLN Take 3  mLs by nebulization 2 (two) times daily.     levETIRAcetam  (KEPPRA ) 500 MG tablet Take 1 tablet (500 mg total) by mouth 2 (two) times daily.     levothyroxine  (SYNTHROID ) 125 MCG tablet Take 125 mcg by mouth every morning.     lidocaine -prilocaine  (EMLA ) cream Apply 1 Application topically once.     metoprolol  succinate (TOPROL -XL) 50 MG 24 hr tablet Take 50 mg by mouth daily. Take with or immediately following a meal.     Multiple Vitamin (MULTIVITAMIN) capsule Take 1 capsule by mouth daily.     nutrition supplement, JUVEN, (JUVEN) PACK Take 1 packet by mouth 2 (two) times daily between meals. 30 packet 0   Nutritional Supplements (,FEEDING SUPPLEMENT, PROSOURCE PLUS) liquid Take 30 mLs by mouth 2 (two) times daily between meals. 887 mL 0   nystatin  (MYCOSTATIN /NYSTOP ) powder Apply 1 Application topically 2 (two) times daily.  ondansetron  (ZOFRAN ) 4 MG/5ML solution Take 8 mg by mouth every 8 (eight) hours as needed for refractory nausea / vomiting.     pantoprazole  (PROTONIX ) 40 MG tablet Take 1 tablet (40 mg total) by mouth 2 (two) times daily before a meal.     polyethylene glycol (MIRALAX  / GLYCOLAX ) 17 g packet Take 17 g by mouth daily.     pregabalin  (LYRICA ) 50 MG capsule Take 50 mg by mouth 2 (two) times daily.     rOPINIRole  (REQUIP  XL) 4 MG 24 hr tablet Take 1 tablet (4 mg total) by mouth at bedtime.     senna (SENOKOT) 8.6 MG TABS tablet Take 1 tablet (8.6 mg total) by mouth in the morning and at bedtime. 120 tablet 3   sertraline  (ZOLOFT ) 100 MG tablet Take 100 mg by mouth daily.     spironolactone  (ALDACTONE ) 25 MG tablet Take 25 mg by mouth daily.     TRADJENTA  5 MG TABS tablet Take 5 mg by mouth daily.     ZINC  OXIDE, TOPICAL, 25 % OINT Apply 1 Application topically 2 (two) times daily.     alum & mag hydroxide-simeth (MAALOX PLUS) 400-400-40 MG/5ML suspension Take 10 mLs by mouth every 6 (six) hours as needed for indigestion. (Patient not taking: Reported on 10/06/2024)     No  current facility-administered medications for this visit.   Facility-Administered Medications Ordered in Other Visits  Medication Dose Route Frequency Provider Last Rate Last Admin   oxyCODONE  (Oxy IR/ROXICODONE ) immediate release tablet 5 mg  5 mg Oral Once Agrawal, Kavita, MD        OBJECTIVE: Vitals:   10/06/24 0949  BP: 103/78  Pulse: 100  Resp: 18  Temp: 97.9 F (36.6 C)  SpO2: 100%      Body mass index is 26.24 kg/m.    ECOG FS:2 - Symptomatic, <50% confined to bed  General: Well-developed, well-nourished, no acute distress.  Sitting in a wheelchair. Eyes: Pink conjunctiva, anicteric sclera. HEENT: Normocephalic, moist mucous membranes. Lungs: No audible wheezing or coughing. Heart: Regular rate and rhythm. Abdomen: Soft, nontender, no obvious distention. Musculoskeletal: No edema, cyanosis, or clubbing. Neuro: Alert, answering all questions appropriately. Cranial nerves grossly intact. Skin: No rashes or petechiae noted. Psych: Normal affect.  LAB RESULTS:  Lab Results  Component Value Date   NA 140 10/06/2024   K 3.9 10/06/2024   CL 98 10/06/2024   CO2 30 10/06/2024   GLUCOSE 250 (H) 10/06/2024   BUN 29 (H) 10/06/2024   CREATININE 0.55 10/06/2024   CALCIUM  10.8 (H) 10/06/2024   PROT 6.1 (L) 10/06/2024   ALBUMIN 3.7 10/06/2024   AST 15 10/06/2024   ALT 12 10/06/2024   ALKPHOS 103 10/06/2024   BILITOT 0.3 10/06/2024   GFRNONAA >60 10/06/2024   GFRAA >60 08/19/2017    Lab Results  Component Value Date   WBC 10.3 10/06/2024   NEUTROABS 9.1 (H) 10/06/2024   HGB 11.8 (L) 10/06/2024   HCT 36.6 10/06/2024   MCV 82.6 10/06/2024   PLT 202 10/06/2024     STUDIES: X-ray chest PA or AP Result Date: 09/22/2024 EXAM: 1 VIEW(S) XRAY OF THE CHEST 09/22/2024 09:38:00 AM COMPARISON: 09/17/2024 CLINICAL HISTORY: Status post bronchoscopy [id number] FINDINGS: LINES, TUBES AND DEVICES: Left chest wall port in place with tip projecting over the SVC, unchanged. LUNGS  AND PLEURA: Similar appearance of right mid and upper lung opacities, slightly increased opacities in the right lung base, and diffuse hazy opacities in the left  lung, similar to prior. Trace bilateral pleural effusions. No pneumothorax. HEART AND MEDIASTINUM: No acute abnormality of the cardiac and mediastinal silhouettes. The tip of the left chest wall port projects over the SVC. BONES AND SOFT TISSUES: No acute osseous abnormality. The left chest wall port is in place. IMPRESSION: 1. Similar right mid and upper lung opacities likely related to neoplastic process better seen on CT. Slight interval increase at the right lung base, favor atelectasis. 2. Diffuse opacities in the left lung, unchanged. Concerning for infection. 3. Trace bilateral pleural effusions. Electronically signed by: Donnice Mania MD 09/22/2024 01:43 PM EST RP Workstation: [id number]   CT Angio Chest PE W and/or Wo Contrast Result Date: 09/17/2024 EXAM: CTA CHEST 09/17/2024 10:07:39 PM TECHNIQUE: CTA of the chest was performed after the administration of intravenous contrast. Multiplanar reformatted images are provided for review. MIP images are provided for review. Automated exposure control, iterative reconstruction, and/or weight based adjustment of the mA/kV was utilized to reduce the radiation dose to as low as reasonably achievable. COMPARISON: None available. CLINICAL HISTORY: Lung cancer patient with right upper lobe neoplasm presenting with hypoxemia and respiratory distress. Pulmonary embolism (PE) suspected, high prob. FINDINGS: PULMONARY ARTERIES: Pulmonary arteries are adequately opacified for evaluation. The pulmonary arteries are slightly prominent but no arterial embolus is seen. MEDIASTINUM: There is mild cardiomegaly with a slight right chamber predominance with RV/LV ratio of 1.06. There is no pericardial effusion. There is atherosclerosis in the aorta, great vessels, small amount in the left main and proximal LAD coronary  artery. No aortic dissection or aneurysm. The pulmonary veins are nondilated. There is mild chronic enlargement and heterogeneity of the thyroid  gland without a dominant nodule. There is a chronically patulous esophagus without wall thickening. IVC filter in place. Left chest mediport with IJ approach catheter terminating in the SVC. LYMPH NODES: Subcarinal adenopathy is unchanged up to 1.2 cm on axial 58 of series 6, with increased prominence of AP window and left hilar nodes up to 1.2 cm, increased prominence of right hilar nodes up to 1.5 cm on axial 67. LUNGS AND PLEURA: Lungs are emphysematous. There is mild layering fluid in the distal trachea. There is layering fluid in the bilateral main bronchi, on the left extending into and obstructing the main lower lobe and lower lobe segmental bronchi, with consolidation throughout the left lower lobe consistent with a consolidated pneumonia. There is a right upper lobe suprahilar mass encasing central bronchovascular structures with bronchial narrowing. It is difficult to distinguish between the mass and adjacent pulmonary consolidation. In total, the mass and consolidation today measure 5 x 6.6 cm on axial 47 of series 6, not grossly changed from the PET CT but certainly significantly larger than on 05/24/2024. There is surrounding spiculation and stranding to pleural surfaces and areas of pleural thickening in the right chest superiorly and medially probably due to pleural metastatic disease and hypermetabolic on the PET CT. There is a small layering right pleural effusion with partial loculation in the superior chest. In the right lower lobe, several coarsely nodular opacities are again noted, consistent with patchy airspace disease versus intraorgan metastases. The largest of these is 2.3 x 1.9 cm on series 8 axial 82, unchanged. Elsewhere, there is progressive diffuse interstitial thickening and micronodularity relatively sparing the right middle lobe and first  seen on PET CT but worsened since. 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 No pneumothorax. UPPER ABDOMEN: The gallbladder is dilated and  there are tiny layering stones posteriorly without wall thickening. There are stones in the right renal collecting system and renal pelvis, largest is 6 mm just above the UPJ. There is mild right hydronephrosis. The liver is steatotic. There is a 2 cm Bosniak 1 cyst in the right upper pole. SOFT TISSUES AND BONES: There is osteopenia and kyphosis of the thoracic spine. No destructive bone lesions. No acute soft tissue abnormality. IMPRESSION: 1. No evidence of pulmonary embolism. 2. Left lower lobe consolidation with mucus impaction and complete obstruction of the left lower lobe bronchus, consistent with pneumonia. 3. Cardiomegaly with right chamber predominance indicating right heart dysfunction. the pulmonary arteries are slightly prominent. 4. Fluid in the main bronchi, obstructing the left lower lobe main and segmental bronchi with complete consolidation/ atelectasis of the left lower lobe. 5. Right upper lobe suprahilar mass with bronchovascular encasement and bronchial narrowing, overall size approximately 5 x 6.6 cm, not grossly changed from recent PET CT but increased from 05/24/2024, with associated pleural thickening suspicious for pleural metastatic disease and small partially loculated right pleural effusion . 6. 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 7. Mediastinal and hilar adenopathy with interval increase in AP window and bilateral hilar nodes up to 1.5 cm and stable subcarinal node up to 1.2 cm. 8. Right nephrolithiasis including a 6 mm renal pelvis just above the UPJ with mild right hydronephrosis. 9. Emphysema, with aortic and coronary atherosclerosis. Electronically signed by: Francis Quam MD 09/17/2024  10:53 PM EST RP Workstation: [id number]   DG Chest Port 1 View Result Date: 09/17/2024 EXAM: 1 VIEW(S) XRAY OF THE CHEST 09/17/2024 07:45:00 PM COMPARISON: 09/02/2024 CLINICAL HISTORY: Questionable sepsis - evaluate for abnormality FINDINGS: LINES, TUBES AND DEVICES: Left-sided port-a-cath tip overlies mid SVC. LUNGS AND PLEURA: Alveolar consolidation right upper lung and diffusely on the left. Small left-sided pleural effusion. No pneumothorax. HEART AND MEDIASTINUM: No acute abnormality of the cardiac and mediastinal silhouettes. BONES AND SOFT TISSUES: No acute osseous abnormality. IMPRESSION: 1. Alveolar consolidation in the right upper lung and diffusely on the left. 2. Small left-sided pleural effusion. 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 Tempus liquid biopsy did not reveal any actionable mutations and tumor mutational burden was low.  PET scan results from December 24, 2023 confirmed stage of disease.  MRI of the brain on January 12, 2024 did not reveal any metastatic disease.  She completed her concurrent weekly chemotherapy with carboplatin  and Taxol  along with her daily XRT on approximately Mar 10, 2024.  Her most recent CT scan on May 24, 2024 suggested a mixed response to disease.  PET scan results from July 26, 2024 difficult to interpret but does not report any progressive disease.  Will hold cycle 7 of durvalumab  today.  Patient has PICC line placed and will receive 10 days of IV antibiotics as per ID.  Return to clinic in 5 weeks for further evaluation and reinitiation of treatment.    Hypercalcemia: Chronic and unchanged.  Patient's calcium  level was 10.8 today. Shortness of breath: Chronic and unchanged.  Continue  oxygen as needed.  Follow-up with pulmonary as scheduled. Pain: Chronic and unchanged.  Patient currently taking Lyrica  50 mg twice per day.   History  of right lower extremity DVT: IVC filter placed on November 09, 2023. Anemia: Hemoglobin improved to 11.8.  Repeat iron  stores on October 06, 2024 are within normal limits.   She has a history of GI bleed. EGD on October 28, 2023 noted bleeding duodenal ulcer treated with clip placement.  Patient also had embolization with IR on October 30, 2023.  She last received IV Venofer  on Mar 10, 2024. Social situation: Patient was previously given a referral back to social work. Constipation: Patient does not complain of this today.  Continue Senokot.  Patient has also been instructed she can try magnesium  citrate.  Patient expressed understanding and was in agreement with this plan. She also understands that She can call clinic at any time with any questions, concerns, or complaints.    Cancer Staging  Cancer of upper lobe of right lung [facility]) Staging form: Lung, AJCC V9 - Clinical stage from 11/12/2023: Stage IIIA (cT3, cN1, cM0) - Signed by Agrawal, Kavita, MD on 01/07/2024   Evalene JINNY Reusing, MD   10/06/2024 12:58 PM

## 2024-10-10 ENCOUNTER — Other Ambulatory Visit: Payer: Self-pay

## 2024-10-15 ENCOUNTER — Emergency Department

## 2024-10-15 ENCOUNTER — Inpatient Hospital Stay
Admission: EM | Admit: 2024-10-15 | Discharge: 2024-10-21 | DRG: 189 | Disposition: A | Discharge status: Hospice | Source: Skilled Nursing Facility | Attending: Internal Medicine | Admitting: Internal Medicine

## 2024-10-15 ENCOUNTER — Other Ambulatory Visit: Payer: Self-pay

## 2024-10-15 DIAGNOSIS — Z79890 Hormone replacement therapy (postmenopausal): Secondary | ICD-10-CM

## 2024-10-15 DIAGNOSIS — G4733 Obstructive sleep apnea (adult) (pediatric): Secondary | ICD-10-CM | POA: Diagnosis present

## 2024-10-15 DIAGNOSIS — Z880 Allergy status to penicillin: Secondary | ICD-10-CM

## 2024-10-15 DIAGNOSIS — M79605 Pain in left leg: Secondary | ICD-10-CM | POA: Diagnosis present

## 2024-10-15 DIAGNOSIS — F32A Depression, unspecified: Secondary | ICD-10-CM | POA: Diagnosis not present

## 2024-10-15 DIAGNOSIS — K5900 Constipation, unspecified: Secondary | ICD-10-CM

## 2024-10-15 DIAGNOSIS — E1165 Type 2 diabetes mellitus with hyperglycemia: Secondary | ICD-10-CM | POA: Diagnosis present

## 2024-10-15 DIAGNOSIS — Z794 Long term (current) use of insulin: Secondary | ICD-10-CM

## 2024-10-15 DIAGNOSIS — I5020 Unspecified systolic (congestive) heart failure: Secondary | ICD-10-CM

## 2024-10-15 DIAGNOSIS — J9621 Acute and chronic respiratory failure with hypoxia: Secondary | ICD-10-CM | POA: Diagnosis present

## 2024-10-15 DIAGNOSIS — Z7984 Long term (current) use of oral hypoglycemic drugs: Secondary | ICD-10-CM

## 2024-10-15 DIAGNOSIS — I452 Bifascicular block: Secondary | ICD-10-CM | POA: Diagnosis present

## 2024-10-15 DIAGNOSIS — F419 Anxiety disorder, unspecified: Secondary | ICD-10-CM | POA: Diagnosis present

## 2024-10-15 DIAGNOSIS — E870 Hyperosmolality and hypernatremia: Secondary | ICD-10-CM | POA: Diagnosis present

## 2024-10-15 DIAGNOSIS — I110 Hypertensive heart disease with heart failure: Secondary | ICD-10-CM | POA: Diagnosis present

## 2024-10-15 DIAGNOSIS — E785 Hyperlipidemia, unspecified: Secondary | ICD-10-CM | POA: Diagnosis not present

## 2024-10-15 DIAGNOSIS — J9622 Acute and chronic respiratory failure with hypercapnia: Secondary | ICD-10-CM | POA: Diagnosis present

## 2024-10-15 DIAGNOSIS — G40909 Epilepsy, unspecified, not intractable, without status epilepticus: Secondary | ICD-10-CM | POA: Diagnosis present

## 2024-10-15 DIAGNOSIS — Z79899 Other long term (current) drug therapy: Secondary | ICD-10-CM

## 2024-10-15 DIAGNOSIS — K219 Gastro-esophageal reflux disease without esophagitis: Secondary | ICD-10-CM | POA: Diagnosis present

## 2024-10-15 DIAGNOSIS — Z7401 Bed confinement status: Secondary | ICD-10-CM

## 2024-10-15 DIAGNOSIS — I639 Cerebral infarction, unspecified: Secondary | ICD-10-CM | POA: Diagnosis present

## 2024-10-15 DIAGNOSIS — E1142 Type 2 diabetes mellitus with diabetic polyneuropathy: Secondary | ICD-10-CM | POA: Diagnosis present

## 2024-10-15 DIAGNOSIS — G9340 Encephalopathy, unspecified: Principal | ICD-10-CM | POA: Diagnosis present

## 2024-10-15 DIAGNOSIS — R7989 Other specified abnormal findings of blood chemistry: Secondary | ICD-10-CM | POA: Insufficient documentation

## 2024-10-15 DIAGNOSIS — Z8249 Family history of ischemic heart disease and other diseases of the circulatory system: Secondary | ICD-10-CM

## 2024-10-15 DIAGNOSIS — E1151 Type 2 diabetes mellitus with diabetic peripheral angiopathy without gangrene: Secondary | ICD-10-CM

## 2024-10-15 DIAGNOSIS — E119 Type 2 diabetes mellitus without complications: Secondary | ICD-10-CM

## 2024-10-15 DIAGNOSIS — Z91199 Patient's noncompliance with other medical treatment and regimen due to unspecified reason: Secondary | ICD-10-CM

## 2024-10-15 DIAGNOSIS — Z90710 Acquired absence of both cervix and uterus: Secondary | ICD-10-CM

## 2024-10-15 DIAGNOSIS — Z86718 Personal history of other venous thrombosis and embolism: Secondary | ICD-10-CM

## 2024-10-15 DIAGNOSIS — Z66 Do not resuscitate: Secondary | ICD-10-CM | POA: Diagnosis not present

## 2024-10-15 DIAGNOSIS — Z803 Family history of malignant neoplasm of breast: Secondary | ICD-10-CM

## 2024-10-15 DIAGNOSIS — E11649 Type 2 diabetes mellitus with hypoglycemia without coma: Secondary | ICD-10-CM | POA: Diagnosis not present

## 2024-10-15 DIAGNOSIS — I69351 Hemiplegia and hemiparesis following cerebral infarction affecting right dominant side: Secondary | ICD-10-CM

## 2024-10-15 DIAGNOSIS — Z87891 Personal history of nicotine dependence: Secondary | ICD-10-CM

## 2024-10-15 DIAGNOSIS — Z515 Encounter for palliative care: Secondary | ICD-10-CM

## 2024-10-15 DIAGNOSIS — C3411 Malignant neoplasm of upper lobe, right bronchus or lung: Secondary | ICD-10-CM | POA: Diagnosis present

## 2024-10-15 DIAGNOSIS — E663 Overweight: Secondary | ICD-10-CM | POA: Diagnosis present

## 2024-10-15 DIAGNOSIS — Z882 Allergy status to sulfonamides status: Secondary | ICD-10-CM

## 2024-10-15 DIAGNOSIS — I5022 Chronic systolic (congestive) heart failure: Secondary | ICD-10-CM | POA: Diagnosis present

## 2024-10-15 DIAGNOSIS — Z7982 Long term (current) use of aspirin: Secondary | ICD-10-CM

## 2024-10-15 DIAGNOSIS — E039 Hypothyroidism, unspecified: Secondary | ICD-10-CM | POA: Diagnosis present

## 2024-10-15 DIAGNOSIS — Z6827 Body mass index (BMI) 27.0-27.9, adult: Secondary | ICD-10-CM

## 2024-10-15 DIAGNOSIS — R627 Adult failure to thrive: Secondary | ICD-10-CM | POA: Diagnosis present

## 2024-10-15 DIAGNOSIS — J449 Chronic obstructive pulmonary disease, unspecified: Secondary | ICD-10-CM | POA: Diagnosis present

## 2024-10-15 DIAGNOSIS — Z886 Allergy status to analgesic agent status: Secondary | ICD-10-CM

## 2024-10-15 DIAGNOSIS — G9341 Metabolic encephalopathy: Secondary | ICD-10-CM | POA: Diagnosis present

## 2024-10-15 LAB — TSH: TSH: 1.03 u[IU]/mL (ref 0.350–4.500)

## 2024-10-15 LAB — BLOOD GAS, VENOUS
Acid-Base Excess: 7.4 mmol/L — ABNORMAL HIGH (ref 0.0–2.0)
Bicarbonate: 36.1 mmol/L — ABNORMAL HIGH (ref 20.0–28.0)
O2 Saturation: 72.6 %
Patient temperature: 37
pCO2, Ven: 70 mmHg — ABNORMAL HIGH (ref 44–60)
pH, Ven: 7.32 (ref 7.25–7.43)
pO2, Ven: 46 mmHg — ABNORMAL HIGH (ref 32–45)

## 2024-10-15 LAB — URINALYSIS, W/ REFLEX TO CULTURE (INFECTION SUSPECTED)
Bacteria, UA: NONE SEEN
Bilirubin Urine: NEGATIVE
Glucose, UA: 50 mg/dL — AB
Ketones, ur: NEGATIVE mg/dL
Leukocytes,Ua: NEGATIVE
Nitrite: NEGATIVE
Protein, ur: NEGATIVE mg/dL
RBC / HPF: >50 RBC/hpf (ref 0–5)
Specific Gravity, Urine: 1.021 (ref 1.005–1.030)
Squamous Epithelial / HPF: 0 /HPF (ref 0–5)
pH: 5 (ref 5.0–8.0)

## 2024-10-15 LAB — CBC WITH DIFFERENTIAL/PLATELET
Abs Immature Granulocytes: 0.06 K/uL (ref 0.00–0.07)
Basophils Absolute: 0 K/uL (ref 0.0–0.1)
Basophils Relative: 0 %
Eosinophils Absolute: 0 K/uL (ref 0.0–0.5)
Eosinophils Relative: 0 %
HCT: 37.7 % (ref 36.0–46.0)
Hemoglobin: 11.2 g/dL — ABNORMAL LOW (ref 12.0–15.0)
Immature Granulocytes: 1 %
Lymphocytes Relative: 6 %
Lymphs Abs: 0.3 K/uL — ABNORMAL LOW (ref 0.7–4.0)
MCH: 26.2 pg (ref 26.0–34.0)
MCHC: 29.7 g/dL — ABNORMAL LOW (ref 30.0–36.0)
MCV: 88.1 fL (ref 80.0–100.0)
Monocytes Absolute: 0.4 K/uL (ref 0.1–1.0)
Monocytes Relative: 7 %
Neutro Abs: 4.4 K/uL (ref 1.7–7.7)
Neutrophils Relative %: 86 %
Platelets: 202 K/uL (ref 150–400)
RBC: 4.28 MIL/uL (ref 3.87–5.11)
RDW: 15.5 % (ref 11.5–15.5)
WBC: 5.2 K/uL (ref 4.0–10.5)
nRBC: 0 % (ref 0.0–0.2)

## 2024-10-15 LAB — COMPREHENSIVE METABOLIC PANEL WITH GFR
ALT: 6 U/L (ref 0–44)
AST: 24 U/L (ref 15–41)
Albumin: 3.2 g/dL — ABNORMAL LOW (ref 3.5–5.0)
Alkaline Phosphatase: 113 U/L (ref 38–126)
Anion gap: 12 (ref 5–15)
BUN: 19 mg/dL (ref 8–23)
CO2: 32 mmol/L (ref 22–32)
Calcium: 11.5 mg/dL — ABNORMAL HIGH (ref 8.9–10.3)
Chloride: 101 mmol/L (ref 98–111)
Creatinine, Ser: 0.55 mg/dL (ref 0.44–1.00)
GFR, Estimated: >60 mL/min (ref >60)
Glucose, Bld: 208 mg/dL — ABNORMAL HIGH (ref 70–99)
Potassium: 5 mmol/L (ref 3.5–5.1)
Sodium: 144 mmol/L (ref 135–145)
Total Bilirubin: 0.2 mg/dL (ref 0.0–1.2)
Total Protein: 6.2 g/dL — ABNORMAL LOW (ref 6.5–8.1)

## 2024-10-15 LAB — TROPONIN T, HIGH SENSITIVITY
Troponin T High Sensitivity: 36 ng/L — ABNORMAL HIGH (ref 0–19)
Troponin T High Sensitivity: 32 ng/L — ABNORMAL HIGH (ref 0–19)

## 2024-10-15 LAB — AMMONIA: Ammonia: 55 umol/L — ABNORMAL HIGH (ref 9–35)

## 2024-10-15 LAB — PROTIME-INR
INR: 0.9 (ref 0.8–1.2)
Prothrombin Time: 13.2 s (ref 11.4–15.2)

## 2024-10-15 LAB — LACTIC ACID, PLASMA: Lactic Acid, Venous: 2 mmol/L (ref 0.5–1.9)

## 2024-10-15 LAB — RESP PANEL BY RT-PCR (RSV, FLU A&B, COVID)  RVPGX2
Influenza A by PCR: NEGATIVE
Influenza B by PCR: NEGATIVE
Resp Syncytial Virus by PCR: NEGATIVE
SARS Coronavirus 2 by RT PCR: NEGATIVE

## 2024-10-15 LAB — CBG MONITORING, ED: Glucose-Capillary: 218 mg/dL — ABNORMAL HIGH (ref 70–99)

## 2024-10-15 MED ORDER — CALCIUM CARBONATE ANTACID 500 MG PO CHEW
1.0000 | CHEWABLE_TABLET | Freq: Every day | ORAL | Status: DC
  Administered 2024-10-16: 10:00:00 200 mg via ORAL
  Filled 2024-10-15: qty 1

## 2024-10-15 MED ORDER — SERTRALINE HCL 50 MG PO TABS
100.0000 mg | ORAL_TABLET | Freq: Every day | ORAL | Status: DC
  Administered 2024-10-16 – 2024-10-21 (×5): 100 mg via ORAL
  Filled 2024-10-15 (×6): qty 2

## 2024-10-15 MED ORDER — ACETAMINOPHEN 325 MG PO TABS: 650.0000 mg | ORAL_TABLET | Freq: Four times a day (QID) | ORAL | Status: DC | PRN

## 2024-10-15 MED ORDER — FUROSEMIDE 40 MG PO TABS
20.0000 mg | ORAL_TABLET | Freq: Every day | ORAL | Status: DC
  Administered 2024-10-16: 10:00:00 20 mg via ORAL
  Filled 2024-10-15: qty 1

## 2024-10-15 MED ORDER — PROSOURCE PLUS PO LIQD
30.0000 mL | Freq: Two times a day (BID) | ORAL | Status: DC
  Administered 2024-10-17 – 2024-10-20 (×6): 30 mL via ORAL

## 2024-10-15 MED ORDER — SPIRONOLACTONE 25 MG PO TABS: 25.0000 mg | ORAL_TABLET | Freq: Every day | ORAL | Status: DC

## 2024-10-15 MED ORDER — PREGABALIN 50 MG PO CAPS
50.0000 mg | ORAL_CAPSULE | Freq: Two times a day (BID) | ORAL | Status: DC
  Administered 2024-10-16 – 2024-10-21 (×9): 50 mg via ORAL
  Filled 2024-10-15 (×10): qty 1

## 2024-10-15 MED ORDER — ACETAMINOPHEN 650 MG RE SUPP: 650.0000 mg | Freq: Four times a day (QID) | RECTAL | Status: DC | PRN

## 2024-10-15 MED ORDER — ONDANSETRON HCL 4 MG/2ML IJ SOLN: 4.0000 mg | Freq: Four times a day (QID) | INTRAMUSCULAR | Status: DC | PRN

## 2024-10-15 MED ORDER — COLLAGENASE 250 UNIT/GM EX OINT
TOPICAL_OINTMENT | Freq: Every day | CUTANEOUS | Status: DC
  Filled 2024-10-15: qty 30

## 2024-10-15 MED ORDER — SODIUM CHLORIDE 0.9 % IV SOLN: INTRAVENOUS | Status: AC

## 2024-10-15 MED ORDER — JUVEN PO PACK
1.0000 | PACK | Freq: Two times a day (BID) | ORAL | Status: DC
  Administered 2024-10-17 – 2024-10-19 (×5): 1 via ORAL

## 2024-10-15 MED ORDER — ONDANSETRON HCL 4 MG PO TABS: 4.0000 mg | ORAL_TABLET | Freq: Four times a day (QID) | ORAL | Status: DC | PRN

## 2024-10-15 MED ORDER — PANTOPRAZOLE SODIUM 40 MG PO TBEC
40.0000 mg | DELAYED_RELEASE_TABLET | Freq: Two times a day (BID) | ORAL | Status: DC
  Administered 2024-10-16 – 2024-10-21 (×9): 40 mg via ORAL
  Filled 2024-10-15 (×10): qty 1

## 2024-10-15 MED ORDER — LINAGLIPTIN 5 MG PO TABS
5.0000 mg | ORAL_TABLET | Freq: Every day | ORAL | Status: DC
  Administered 2024-10-16 – 2024-10-20 (×4): 5 mg via ORAL
  Filled 2024-10-15 (×5): qty 1

## 2024-10-15 MED ORDER — TRAZODONE HCL 50 MG PO TABS
25.0000 mg | ORAL_TABLET | Freq: Every evening | ORAL | Status: DC | PRN
  Administered 2024-10-17 – 2024-10-19 (×2): 25 mg via ORAL
  Filled 2024-10-15 (×2): qty 1

## 2024-10-15 MED ORDER — ATORVASTATIN CALCIUM 20 MG PO TABS
20.0000 mg | ORAL_TABLET | Freq: Every day | ORAL | Status: DC
  Administered 2024-10-16 – 2024-10-20 (×4): 20 mg via ORAL
  Filled 2024-10-15 (×5): qty 1

## 2024-10-15 MED ORDER — GLUCERNA SHAKE PO LIQD
237.0000 mL | Freq: Two times a day (BID) | ORAL | Status: DC
  Administered 2024-10-16 – 2024-10-18 (×4): 237 mL via ORAL

## 2024-10-15 MED ORDER — METOPROLOL SUCCINATE ER 50 MG PO TB24
50.0000 mg | ORAL_TABLET | Freq: Every day | ORAL | Status: DC
  Administered 2024-10-16 – 2024-10-21 (×5): 50 mg via ORAL
  Filled 2024-10-15 (×6): qty 1

## 2024-10-15 MED ORDER — LEVOTHYROXINE SODIUM 50 MCG PO TABS
125.0000 ug | ORAL_TABLET | Freq: Every day | ORAL | Status: DC
  Administered 2024-10-17 – 2024-10-20 (×4): 125 ug via ORAL
  Filled 2024-10-15: qty 1
  Filled 2024-10-15 (×4): qty 3

## 2024-10-15 MED ORDER — FERROUS SULFATE 325 (65 FE) MG PO TABS
325.0000 mg | ORAL_TABLET | Freq: Every day | ORAL | Status: DC
  Administered 2024-10-16 – 2024-10-20 (×4): 325 mg via ORAL
  Filled 2024-10-15 (×5): qty 1

## 2024-10-15 MED ORDER — IPRATROPIUM-ALBUTEROL 0.5-2.5 (3) MG/3ML IN SOLN
3.0000 mL | Freq: Two times a day (BID) | RESPIRATORY_TRACT | Status: DC
  Administered 2024-10-16 – 2024-10-20 (×8): 3 mL via RESPIRATORY_TRACT
  Filled 2024-10-15 (×8): qty 3

## 2024-10-15 MED ORDER — INSULIN GLARGINE 100 UNIT/ML ~~LOC~~ SOLN
18.0000 [IU] | Freq: Every day | SUBCUTANEOUS | Status: DC
  Administered 2024-10-16 – 2024-10-18 (×3): 18 [IU] via SUBCUTANEOUS
  Filled 2024-10-15 (×3): qty 0.18

## 2024-10-15 MED ORDER — BUSPIRONE HCL 5 MG PO TABS
20.0000 mg | ORAL_TABLET | Freq: Two times a day (BID) | ORAL | Status: DC
  Administered 2024-10-16 – 2024-10-21 (×9): 20 mg via ORAL
  Filled 2024-10-15: qty 2
  Filled 2024-10-15 (×4): qty 4
  Filled 2024-10-15 (×2): qty 2
  Filled 2024-10-15 (×3): qty 4

## 2024-10-15 MED ORDER — LEVETIRACETAM 500 MG PO TABS
500.0000 mg | ORAL_TABLET | Freq: Two times a day (BID) | ORAL | Status: DC
  Administered 2024-10-16 – 2024-10-18 (×6): 500 mg via ORAL
  Filled 2024-10-15 (×7): qty 1

## 2024-10-15 MED ORDER — ADULT MULTIVITAMIN W/MINERALS CH
1.0000 | ORAL_TABLET | Freq: Every day | ORAL | Status: DC
  Administered 2024-10-16 – 2024-10-20 (×4): 1 via ORAL
  Filled 2024-10-15 (×5): qty 1

## 2024-10-15 MED ORDER — LORATADINE 10 MG PO TABS
10.0000 mg | ORAL_TABLET | Freq: Every day | ORAL | Status: DC
  Administered 2024-10-16 – 2024-10-21 (×5): 10 mg via ORAL
  Filled 2024-10-15 (×6): qty 1

## 2024-10-15 MED ORDER — ALUM & MAG HYDROXIDE-SIMETH 200-200-20 MG/5ML PO SUSP: 20.0000 mL | Freq: Four times a day (QID) | ORAL | Status: DC | PRN

## 2024-10-15 MED ORDER — SENNA 8.6 MG PO TABS
1.0000 | ORAL_TABLET | Freq: Two times a day (BID) | ORAL | Status: DC
  Administered 2024-10-16 – 2024-10-21 (×7): 8.6 mg via ORAL
  Filled 2024-10-15 (×8): qty 1

## 2024-10-15 MED ORDER — GLIPIZIDE 5 MG PO TABS
5.0000 mg | ORAL_TABLET | Freq: Every day | ORAL | Status: DC
  Administered 2024-10-16: 10:00:00 5 mg via ORAL
  Filled 2024-10-15: qty 1

## 2024-10-15 MED ORDER — ROPINIROLE HCL ER 4 MG PO TB24
4.0000 mg | ORAL_TABLET | Freq: Every day | ORAL | Status: DC
  Administered 2024-10-16 – 2024-10-19 (×3): 4 mg via ORAL
  Filled 2024-10-15 (×6): qty 1

## 2024-10-15 MED ORDER — ASPIRIN 81 MG PO TBEC
81.0000 mg | DELAYED_RELEASE_TABLET | Freq: Every day | ORAL | Status: DC
  Administered 2024-10-16 – 2024-10-20 (×4): 81 mg via ORAL
  Filled 2024-10-15 (×5): qty 1

## 2024-10-15 MED ORDER — ENOXAPARIN SODIUM 40 MG/0.4ML IJ SOSY
40.0000 mg | PREFILLED_SYRINGE | INTRAMUSCULAR | Status: DC
  Administered 2024-10-16 – 2024-10-20 (×5): 40 mg via SUBCUTANEOUS
  Filled 2024-10-15 (×5): qty 0.4

## 2024-10-15 NOTE — ED Notes (Signed)
O2 turned off.

## 2024-10-15 NOTE — ED Triage Notes (Signed)
 Pt here via EMS from [facility] for labile RR and SpO2. EMS notes AMS on arrival, unable to answer question, speech unintelligible. Pt is altered here, not meds PTA. Unknown POA. Vitals WDL. Pt responds to some commands, but is delayed. Pt tachypneic w/ labored breathing (resolved here). BG w/ EMS 239.

## 2024-10-15 NOTE — H&P (Incomplete)
 [name]   PATIENT NAME: Michele House    MR#:  [id number]  DATE OF BIRTH:  Feb 09, 1956  DATE OF ADMISSION:  10/15/2024  PRIMARY CARE PHYSICIAN: [place], [facility]   Patient is coming from: Home  REQUESTING/REFERRING PHYSICIAN: Fernand Rossie HERO, MD   CHIEF COMPLAINT:   Chief Complaint  Patient presents with   Altered Mental Status    HISTORY OF PRESENT ILLNESS:  Michele House is a 68 y.o. female with medical history significant for GERD, COPD, type 2 diabetes mellitus, pretension, dyslipidemia, hypothyroidism and CVA, who presented to the emergency room with acute onset of altered mental status which has been worsening over the last 3 to 4 days with increased confusion and decreased responsiveness.  She has baseline weakness of both lower extremities in the right upper extremity from previous CVA.  She is usually verbal in the last few days has not been communicating much.  She was admitted here recently from 11/16 until 09/22/2024 for management of sepsis due to pneumonia with atelectasis of the left lower lobe with associated non-small cell lung cancer, COPD exacerbation and acute on chronic respiratory failure with hypoxia and hypercarbia, hyperkalemia and was discharged to her SNF.  She underwent a bronchoscopy with removal of heavy amounts of mucous plugging with improvement of her oxygenation and BAL specimens later grew Aspergillus fumigatus and ESBL E. coli sensitive to meropenem and to less extent Unasyn .  She has apparently been stable since discharge until her last 4 days.  No reported fever or chills.  No reported nausea or vomiting or abdominal pain.  No dysuria, oliguria or hematuria or flank pain or reported.  The patient was fairly somnolent and therefore not on her historian.  ED Course: When the patient came to the ER vital signs were within normal.  Labs revealed VBG with pH 7.32 and HCO3 36.1 and pCO2 of 70 with pO2 46.  CMP revealed blood glucose of  208 and calcium  of 11.5 with albumin 3.2 with total bili 6.2 her ammonia level was 55.  High-sensitivity troponin T was 36 and later 32 lactic acid was 2 and CBC showed hemoglobin 11.2 and hematocrit 37.7 without leukocytosis.  PT and INR were normal.  Free T4 was 1.03.  Respiratory panel came back negative.  UA showed more than 50 RBCs with 0-5 WBCs and 50 glucose and was hazy.  2 blood cultures were drawn. EKG as reviewed by me : EKG revealed sinus rhythm with a rate of 97 with right bundle branch block and left anterior fascicular block with LVH with T wave inversion anteroseptally. Imaging: Noncontrast head CT scan revealed no acute intracranial abnormalities.  Portable chest x-ray showed similar right upper lung mass with bibasal opacities and somewhat diffuse interstitial prominence with small right pleural effusion.  The patient will be admitted to a medical telemetry observation bed for further evaluation and management. PAST MEDICAL HISTORY:   Past Medical History:  Diagnosis Date   Acid reflux    COPD (chronic obstructive pulmonary disease) (HCC)    Diabetes mellitus without complication (HCC)    Hyperlipidemia    Hypertension    Stroke (HCC)    Thyroid  disease     PAST SURGICAL HISTORY:   Past Surgical History:  Procedure Laterality Date   appendectomy     APPENDECTOMY     BREAST BIOPSY Right    CORE W/CLIP - NEG   BRONCHIAL BIOPSY Left 09/22/2024   Procedure: BRONCHOSCOPY, WITH BIOPSY;  Surgeon: Parris Manna, MD;  Location: [facility] ORS;  Service: Thoracic;  Laterality: Left;   ESOPHAGOGASTRODUODENOSCOPY N/A 10/28/2023   Procedure: ESOPHAGOGASTRODUODENOSCOPY (EGD);  Surgeon: Jinny Carmine, MD;  Location: [facility] ENDOSCOPY;  Service: Endoscopy;  Laterality: N/A;   FLEXIBLE BRONCHOSCOPY Bilateral 11/12/2023   Procedure: FLEXIBLE BRONCHOSCOPY;  Surgeon: Parris Manna, MD;  Location: [facility] ORS;  Service: Thoracic;  Laterality: Bilateral;   IR EMBO ART  VEN HEMORR LYMPH EXTRAV  INC  GUIDE ROADMAPPING  10/30/2023   IR IMAGING GUIDED PORT INSERTION  01/18/2024   IVC FILTER INSERTION N/A 11/09/2023   Procedure: IVC FILTER INSERTION;  Surgeon: Marea Selinda RAMAN, MD;  Location: [facility] INVASIVE CV LAB;  Service: Cardiovascular;  Laterality: N/A;   TOTAL VAGINAL HYSTERECTOMY     TUMOR REMOVAL     benign;stomach   VIDEO BRONCHOSCOPY WITH ENDOBRONCHIAL NAVIGATION Right 10/20/2023   Procedure: VIDEO BRONCHOSCOPY WITH ENDOBRONCHIAL NAVIGATION;  Surgeon: Parris Manna, MD;  Location: [facility] ORS;  Service: Thoracic;  Laterality: Right;   VIDEO BRONCHOSCOPY WITH ENDOBRONCHIAL NAVIGATION Right 11/12/2023   Procedure: VIDEO BRONCHOSCOPY WITH ENDOBRONCHIAL NAVIGATION;  Surgeon: Parris Manna, MD;  Location: [facility] ORS;  Service: Thoracic;  Laterality: Right;    SOCIAL HISTORY:   Social History   Tobacco Use   Smoking status: Former    Current packs/day: 0.00    Types: Cigarettes    Quit date: 12/15/1985    Years since quitting: 38.8   Smokeless tobacco: Never  Substance Use Topics   Alcohol use: No    FAMILY HISTORY:   Family History  Problem Relation Age of Onset   Heart attack Mother    Hypertension Mother    Breast cancer Sister 51   Breast cancer Maternal Aunt        40'S   Breast cancer Maternal Grandmother     DRUG ALLERGIES:  Allergies[1]  REVIEW OF SYSTEMS:   ROS As per history of present illness. All pertinent systems were reviewed above. Constitutional, HEENT, cardiovascular, respiratory, GI, GU, musculoskeletal, neuro, psychiatric, endocrine, integumentary and hematologic systems were reviewed and are otherwise negative/unremarkable except for positive findings mentioned above in the HPI.   MEDICATIONS AT HOME:   Prior to Admission medications  Medication Sig Start Date End Date Taking? Authorizing Provider  acetaminophen  (TYLENOL ) 325 MG tablet Take 650 mg by mouth every 6 (six) hours as needed.    [provider]  alum & mag hydroxide-simeth  (MAALOX PLUS) 400-400-40 MG/5ML suspension Take 10 mLs by mouth every 6 (six) hours as needed for indigestion. Patient not taking: Reported on 10/06/2024    [provider]  aspirin  EC 81 MG tablet Take 81 mg by mouth daily.    [provider]  atorvastatin  (LIPITOR) 20 MG tablet Take 20 mg by mouth daily. 06/25/23   [provider]  busPIRone  (BUSPAR ) 10 MG tablet Take 20 mg by mouth 2 (two) times daily.    [provider]  calcium  carbonate (TUMS - DOSED IN MG ELEMENTAL CALCIUM ) 500 MG chewable tablet Chew 1 tablet by mouth daily.    [provider]  cetirizine (ZYRTEC) 10 MG tablet Take 10 mg by mouth daily.    [provider]  collagenase  (SANTYL ) 250 UNIT/GM ointment Apply topically daily. 09/04/24   Patel, Sona, MD  DULERA 200-5 MCG/ACT AERO Inhale 2 puffs into the lungs every 12 (twelve) hours. 09/06/23   [provider]  feeding supplement, GLUCERNA SHAKE, (GLUCERNA SHAKE) LIQD Take 237 mLs by mouth 2 (two) times daily  between meals. 09/04/24   Tobie Calix, MD  ferrous sulfate  325 (65 FE) MG tablet Take 1 tablet (325 mg total) by mouth daily with breakfast. 09/22/24   Maree Hue, MD  furosemide  (LASIX ) 20 MG tablet Take 1 tablet (20 mg total) by mouth daily. 09/27/23   Shona Terry SAILOR, DO  glipiZIDE  (GLUCOTROL ) 5 MG tablet Take 5 mg by mouth daily before breakfast.    [provider]  insulin  aspart (NOVOLOG ) 100 UNIT/ML injection Inject 4 Units into the skin 3 (three) times daily with meals. 12/02/23   Awanda City, MD  insulin  glargine-yfgn (SEMGLEE ) 100 UNIT/ML injection Inject 0.18 mLs (18 Units total) into the skin daily. 12/03/23   Awanda City, MD  ipratropium (ATROVENT HFA) 17 MCG/ACT inhaler Inhale 2 puffs into the lungs every 6 (six) hours as needed.    [provider]  ipratropium-albuterol  (DUONEB) 0.5-2.5 (3) MG/3ML SOLN Take 3 mLs by nebulization 2 (two) times daily.    [provider]  levETIRAcetam   (KEPPRA ) 500 MG tablet Take 1 tablet (500 mg total) by mouth 2 (two) times daily. 12/02/23   Awanda City, MD  levothyroxine  (SYNTHROID ) 125 MCG tablet Take 125 mcg by mouth every morning.    [provider]  lidocaine -prilocaine  (EMLA ) cream Apply 1 Application topically once.    [provider]  metoprolol  succinate (TOPROL -XL) 50 MG 24 hr tablet Take 50 mg by mouth daily. Take with or immediately following a meal.    [provider]  Multiple Vitamin (MULTIVITAMIN) capsule Take 1 capsule by mouth daily.    [provider]  nutrition supplement, JUVEN, (JUVEN) PACK Take 1 packet by mouth 2 (two) times daily between meals. 09/04/24   Patel, Sona, MD  Nutritional Supplements (,FEEDING SUPPLEMENT, PROSOURCE PLUS) liquid Take 30 mLs by mouth 2 (two) times daily between meals. 09/04/24   Patel, Sona, MD  nystatin  (MYCOSTATIN /NYSTOP ) powder Apply 1 Application topically 2 (two) times daily.    [provider]  ondansetron  (ZOFRAN ) 4 MG/5ML solution Take 8 mg by mouth every 8 (eight) hours as needed for refractory nausea / vomiting.    [provider]  pantoprazole  (PROTONIX ) 40 MG tablet Take 1 tablet (40 mg total) by mouth 2 (two) times daily before a meal. 12/02/23   Awanda City, MD  polyethylene glycol (MIRALAX  / GLYCOLAX ) 17 g packet Take 17 g by mouth daily. 12/03/23   Awanda City, MD  pregabalin  (LYRICA ) 50 MG capsule Take 50 mg by mouth 2 (two) times daily.    [provider]  rOPINIRole  (REQUIP  XL) 4 MG 24 hr tablet Take 1 tablet (4 mg total) by mouth at bedtime. 12/02/23   Awanda City, MD  senna (SENOKOT) 8.6 MG TABS tablet Take 1 tablet (8.6 mg total) by mouth in the morning and at bedtime. 03/03/24   Agrawal, Kavita, MD  sertraline  (ZOLOFT ) 100 MG tablet Take 100 mg by mouth daily. 06/28/23   [provider]  spironolactone  (ALDACTONE ) 25 MG tablet Take 25 mg by mouth daily. 09/14/24 09/14/25  [provider]  TRADJENTA  5 MG TABS  tablet Take 5 mg by mouth daily. 07/26/23   [provider]  ZINC  OXIDE, TOPICAL, 25 % OINT Apply 1 Application topically 2 (two) times daily.    [provider]      VITAL SIGNS:  Blood pressure (!) 106/57, pulse 66, temperature 98.8 F (37.1 C), temperature source Oral, resp. rate 18, height 5' 1 (1.549 m), weight 66.7 kg, last menstrual  period 12/24/1998, SpO2 92%.  PHYSICAL EXAMINATION:  Physical Exam  GENERAL:  68 y.o.-year-old patient lying in the bed with no acute distress.  EYES: Pupils equal, round, reactive to light and accommodation. No scleral icterus. Extraocular muscles intact.  HEENT: Head atraumatic, normocephalic. Oropharynx and nasopharynx clear.  NECK:  Supple, no jugular venous distention. No thyroid  enlargement, no tenderness.  LUNGS: Normal breath sounds bilaterally, no wheezing, rales,rhonchi or crepitation. No use of accessory muscles of respiration.  CARDIOVASCULAR: Regular rate and rhythm, S1, S2 normal. No murmurs, rubs, or gallops.  ABDOMEN: Soft, nondistended, nontender. Bowel sounds present. No organomegaly or mass.  EXTREMITIES: No pedal edema, cyanosis, or clubbing.  NEUROLOGIC: Cranial nerves II through XII are intact. Muscle strength 5/5 in all extremities. Sensation intact. Gait not checked.  PSYCHIATRIC: The patient is alert and oriented x 3.  Normal affect and good eye contact. SKIN: No obvious rash, lesion, or ulcer.   LABORATORY PANEL:   CBC Recent Labs  Lab 10/15/24 2033  WBC 5.2  HGB 11.2*  HCT 37.7  PLT 202   ------------------------------------------------------------------------------------------------------------------  Chemistries  Recent Labs  Lab 10/15/24 2033  NA 144  K 5.0  CL 101  CO2 32  GLUCOSE 208*  BUN 19  CREATININE 0.55  CALCIUM  11.5*  AST 24  ALT 6  ALKPHOS 113  BILITOT 0.2    ------------------------------------------------------------------------------------------------------------------  Cardiac Enzymes No results for input(s): TROPONINI in the last 168 hours. ------------------------------------------------------------------------------------------------------------------  RADIOLOGY:  CT Head Wo Contrast Result Date: 10/15/2024 EXAM: CT HEAD WITHOUT 10/15/2024 09:31:00 PM TECHNIQUE: CT of the head was performed without the administration of intravenous contrast. Automated exposure control, iterative reconstruction, and/or weight based adjustment of the mA/kV was utilized to reduce the radiation dose to as low as reasonably achievable. COMPARISON: MRI brain dated 01/12/2024. CLINICAL HISTORY: ams ams FINDINGS: BRAIN AND VENTRICLES: No acute intracranial hemorrhage. No mass effect or midline shift. No extra-axial fluid collection. No evidence of acute infarct. No hydrocephalus. Global cortical atrophy. Subcortical and periventricular small vessel ischemic changes. ORBITS: No acute abnormality. SINUSES AND MASTOIDS: No acute abnormality. SOFT TISSUES AND SKULL: No acute skull fracture. No acute soft tissue abnormality. IMPRESSION: 1. No acute intracranial abnormality. Electronically signed by: Pinkie Pebbles MD 10/15/2024 09:33 PM EST RP Workstation: [id number]   DG Chest Port 1 View Result Date: 10/15/2024 EXAM: 1 VIEW(S) XRAY OF THE CHEST 10/15/2024 08:52:00 PM COMPARISON: 09/22/2024 CLINICAL HISTORY: Questionable sepsis - evaluate for abnormality FINDINGS: LINES, TUBES AND DEVICES: Stable left chest port with tip overlying the SVC. LUNGS AND PLEURA: Similar right upper lung mass and bibasilar opacities. Similar diffuse interstitial prominence. Small right pleural effusion. No pneumothorax. HEART AND MEDIASTINUM: No acute abnormality of the cardiac and mediastinal silhouettes. BONES AND SOFT TISSUES: No acute osseous abnormality. IMPRESSION: 1. Similar right upper  lung mass and bibasilar opacities. 2. Similar diffuse interstitial prominence. 3. Small right pleural effusion. Electronically signed by: Oneil Devonshire MD 10/15/2024 09:01 PM EST RP Workstation: [id number]      IMPRESSION AND PLAN:  Assessment and Plan: * Acute encephalopathy - This is of unclear etiology. - She will be admitted to an observation medical telemetry bed. - Will follow neurochecks every 4 hours for 24 hours. - Will hold off sedatives. - With no improvement of mental status neurology consult can be contemplated in AM. - Given her previous history of aspergillus fumigatus lung infection we will continue her voriconazole .  Anxiety and depression With improved mental status her BuSpar  and Ativan  as well as  Zoloft  can be resumed.  Type 2 diabetes mellitus with peripheral neuropathy (HCC) - Will place the patient on supplement coverage with NovoLog . - Will continue basal coverage. - Will continue Tradjenta . - Will continue Lyrica .   HFrEF (heart failure with reduced ejection fraction) (HCC) Will have to hold off Aldactone  given her borderline potassium. - Will continue Toprol -XL and Tradjenta .   Dyslipidemia - Will continue statin therapy.   Seizure disorder (HCC) - Will continue Keppra .     DVT prophylaxis: Lovenox .  Advanced Care Planning:  Code Status: full code.  Family Communication:  The plan of care was discussed in details with the patient (and family). I answered all questions. The patient agreed to proceed with the above mentioned plan. Further management will depend upon hospital course. Disposition Plan: Back to previous home environment Consults called: none.  All the records are reviewed and case discussed with ED provider.  Status is: Observation  I certify that at the time of admission, it is my clinical judgment that the patient will require hospital care extending less than 2 midnights.                            Dispo: The patient is from:  Home              Anticipated d/c is to: Home              Patient currently is not medically stable to d/c.              Difficult to place patient: No  [place]ison DELENA Peaches M.D on 10/16/2024 at 2:19 AM  [facility]   From 7 PM-7 AM, contact night-coverage [email or web address]  CC: Primary care physician; [place], [facility]     [1]  Allergies Allergen Reactions   Penicillins Hives   Aspirin  Other (See Comments)    Lips numb   Sulfa Antibiotics

## 2024-10-15 NOTE — ED Notes (Signed)
 O2 reduced to 0.5 L [place] in mouth.

## 2024-10-15 NOTE — ED Notes (Signed)
 Pt I&O cath'd for UA sample.

## 2024-10-15 NOTE — ED Provider Notes (Signed)
 [facility] Provider Note    Event Date/Time   First MD Initiated Contact with Patient 10/15/24 2013     (approximate)   History   Altered Mental Status   HPI  SIGNA CHEEK is a 68 y.o. female who presents with concern of altered mental status.  Apparently over the last 3 to 4 days increased confusion and altered mental status.  At baseline history of stroke with weakness in the bilateral lower extremities as well as her right upper extremity, generally able to use her left upper extremity and is conversive.  It seems over the last few days has been increasingly not conversive and confused.  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  She just completed the course of these medications yesterday it seems.  Regardless now presents with altered mental status unfortunately she is not able to give me much further history.     Physical Exam   Triage Vital Signs: ED Triage Vitals  Encounter Vitals Group     BP --      Girls Systolic BP Percentile --      Girls Diastolic BP Percentile --      Boys Systolic BP Percentile --      Boys Diastolic BP Percentile --      Pulse --      Resp --      Temp 10/15/24 2009 97.9 F (36.6 C)     Temp Source 10/15/24 2009 Oral     SpO2 --      Weight 10/15/24 2011 147 lb 0.8 oz (66.7 kg)     Height 10/15/24 2011 5' 1 (1.549 m)     Head Circumference --      Peak Flow --      Pain Score 10/15/24 2010 0     Pain Loc --      Pain Education --      Exclude from Growth Chart --     Most recent vital signs: Vitals:   10/15/24 2015 10/15/24 2100  BP: 107/63 102/63  Pulse: 95 92  Resp: 18 15  Temp:  98 F (36.7 C)  SpO2: 100% 100%     General: Will wake up to verbal stimuli and follow intermittent commands will withdraw from pain in all extremities but greatest in the left  upper extremity CV:  Good peripheral perfusion.  Resp:  Normal effort.  No acute respiratory distress noted Abd:  No distention.  Soft nontender Neuro:  Patient will not lift any of her extremities against gravity, she is confused verbal response Other:     ED Results / Procedures / Treatments   Labs (all labs ordered are listed, but only abnormal results are displayed) Labs Reviewed  LACTIC ACID, PLASMA - Abnormal; Notable for the following components:      Result Value   Lactic Acid, Venous 2.0 (*)    All other components within normal limits  COMPREHENSIVE METABOLIC PANEL WITH GFR - Abnormal; Notable for the following components:   Glucose, Bld 208 (*)    Calcium  11.5 (*)    Total Protein 6.2 (*)    Albumin 3.2 (*)    All other components within normal limits  CBC WITH DIFFERENTIAL/PLATELET - Abnormal; Notable for the following components:   Hemoglobin 11.2 (*)    MCHC 29.7 (*)    Lymphs  Abs 0.3 (*)    All other components within normal limits  URINALYSIS, W/ REFLEX TO CULTURE (INFECTION SUSPECTED) - Abnormal; Notable for the following components:   Color, Urine YELLOW (*)    APPearance HAZY (*)    Glucose, UA 50 (*)    Hgb urine dipstick MODERATE (*)    All other components within normal limits  AMMONIA - Abnormal; Notable for the following components:   Ammonia 55 (*)    All other components within normal limits  BLOOD GAS, VENOUS - Abnormal; Notable for the following components:   pCO2, Ven 70 (*)    pO2, Ven 46 (*)    Bicarbonate 36.1 (*)    Acid-Base Excess 7.4 (*)    All other components within normal limits  CBG MONITORING, ED - Abnormal; Notable for the following components:   Glucose-Capillary 218 (*)    All other components within normal limits  TROPONIN T, HIGH SENSITIVITY - Abnormal; Notable for the following components:   Troponin T High Sensitivity 36 (*)    All other components within normal limits  TROPONIN T, HIGH SENSITIVITY - Abnormal; Notable  for the following components:   Troponin T High Sensitivity 32 (*)    All other components within normal limits  RESP PANEL BY RT-PCR (RSV, FLU A&B, COVID)  RVPGX2  CULTURE, BLOOD (ROUTINE X 2)  CULTURE, BLOOD (ROUTINE X 2)  PROTIME-INR  TSH  LACTIC ACID, PLASMA  BASIC METABOLIC PANEL WITH GFR  CBC     EKG  Sinus rhythm with a rate of about 100, axis of -60, there is right bundle branch block present, remaining intervals appear to be within normal limits.  No obvious ischemia that I appreciate on this EKG   RADIOLOGY Imaging demonstrating a right upper lobe infiltrate as well as right middle lobe infiltrate which suspect is likely secondary to pneumonia, this finding was also noted on the prior x-ray appears to be worsened today but unclear if this is secondary to rotation  PROCEDURES:  Critical Care performed: No  Procedures   MEDICATIONS ORDERED IN ED: Medications  aspirin  EC tablet 81 mg (has no administration in time range)  atorvastatin  (LIPITOR) tablet 20 mg (has no administration in time range)  furosemide  (LASIX ) tablet 20 mg (has no administration in time range)  metoprolol  succinate (TOPROL -XL) 24 hr tablet 50 mg (has no administration in time range)  spironolactone  (ALDACTONE ) tablet 25 mg (has no administration in time range)  busPIRone  (BUSPAR ) tablet 20 mg (has no administration in time range)  sertraline  (ZOLOFT ) tablet 100 mg (has no administration in time range)  glipiZIDE  (GLUCOTROL ) tablet 5 mg (has no administration in time range)  insulin  glargine-yfgn (SEMGLEE ) injection 18 Units (has no administration in time range)  levothyroxine  (SYNTHROID ) tablet 125 mcg (has no administration in time range)  linagliptin  (TRADJENTA ) tablet 5 mg (has no administration in time range)  alum & mag hydroxide-simeth (MAALOX PLUS) 400-400-40 MG/5ML suspension 10 mL (has no administration in time range)  calcium  carbonate (TUMS - dosed in mg elemental calcium ) chewable  tablet 200 mg of elemental calcium  (has no administration in time range)  pantoprazole  (PROTONIX ) EC tablet 40 mg (has no administration in time range)  senna (SENOKOT) tablet 8.6 mg (has no administration in time range)  ferrous sulfate  tablet 325 mg (has no administration in time range)  levETIRAcetam  (KEPPRA ) tablet 500 mg (has no administration in time range)  pregabalin  (LYRICA ) capsule 50 mg (has no administration in time range)  rOPINIRole  (  REQUIP  XL) 24 hr tablet 4 mg (has no administration in time range)  multivitamin capsule 1 capsule (has no administration in time range)  feeding supplement (GLUCERNA SHAKE) (GLUCERNA SHAKE) liquid 237 mL (has no administration in time range)  (feeding supplement) PROSource Plus liquid 30 mL (has no administration in time range)  nutrition supplement (JUVEN) (JUVEN) powder packet 1 packet (has no administration in time range)  loratadine  (CLARITIN ) tablet 10 mg (has no administration in time range)  ipratropium-albuterol  (DUONEB) 0.5-2.5 (3) MG/3ML nebulizer solution 3 mL (has no administration in time range)  collagenase  (SANTYL ) ointment (has no administration in time range)  enoxaparin  (LOVENOX ) injection 40 mg (has no administration in time range)  0.9 %  sodium chloride  infusion (0 mLs Intravenous Hold 10/15/24 2355)  acetaminophen  (TYLENOL ) tablet 650 mg (has no administration in time range)    Or  acetaminophen  (TYLENOL ) suppository 650 mg (has no administration in time range)  traZODone  (DESYREL ) tablet 25 mg (has no administration in time range)  ondansetron  (ZOFRAN ) tablet 4 mg (has no administration in time range)    Or  ondansetron  (ZOFRAN ) injection 4 mg (has no administration in time range)     IMPRESSION / MDM / ASSESSMENT AND PLAN / ED COURSE  I reviewed the triage vital signs and the nursing notes.                               Patient's presentation is most consistent with acute presentation with potential threat to life or  bodily function.  68 year old female who presents today with concern of altered mental status.  Unclear what her baseline mentation is initially, however it does seem that she has chronic weakness in her lower extremities as well as her right upper extremity.  I initially considered stroke, will reach out to nursing home to obtain last known well and further details in regards to patient's goals of care.   Clinical Course as of 10/15/24 2359  Sun Oct 15, 2024  2035 Spoke with Alan from [facility], patient's nursing facility.  They informing that this has been a gradual decline over the last few days.  It seems that she has a history of chronic weakness from a prior stroke, chronic bilateral lower extremity weakness as well as right upper extremity weakness.  They have noticed a decline in her mentation, where she is not as alert as normal, she is normally able to answer questions and have much better conversation. [SK]  2250 I spoke with Dr. Lawence who is agreed to evaluate the patient determine course for further medical management this time. [SK]    Clinical Course User Index [SK] Fernand Rossie HERO, MD     FINAL CLINICAL IMPRESSION(S) / ED DIAGNOSES   Final diagnoses:  Acute encephalopathy     Rx / DC Orders   ED Discharge Orders     None        Note:  This document was prepared using Dragon voice recognition software and may include unintentional dictation errors.   Fernand Rossie HERO, MD 10/16/24 0000

## 2024-10-16 DIAGNOSIS — I110 Hypertensive heart disease with heart failure: Secondary | ICD-10-CM | POA: Diagnosis present

## 2024-10-16 DIAGNOSIS — J9622 Acute and chronic respiratory failure with hypercapnia: Secondary | ICD-10-CM | POA: Diagnosis present

## 2024-10-16 DIAGNOSIS — E11649 Type 2 diabetes mellitus with hypoglycemia without coma: Secondary | ICD-10-CM | POA: Diagnosis not present

## 2024-10-16 DIAGNOSIS — I452 Bifascicular block: Secondary | ICD-10-CM | POA: Diagnosis present

## 2024-10-16 DIAGNOSIS — F419 Anxiety disorder, unspecified: Secondary | ICD-10-CM | POA: Insufficient documentation

## 2024-10-16 DIAGNOSIS — Z515 Encounter for palliative care: Secondary | ICD-10-CM | POA: Diagnosis not present

## 2024-10-16 DIAGNOSIS — I5022 Chronic systolic (congestive) heart failure: Secondary | ICD-10-CM | POA: Diagnosis present

## 2024-10-16 DIAGNOSIS — R627 Adult failure to thrive: Secondary | ICD-10-CM | POA: Diagnosis present

## 2024-10-16 DIAGNOSIS — G9340 Encephalopathy, unspecified: Secondary | ICD-10-CM | POA: Diagnosis present

## 2024-10-16 DIAGNOSIS — I69351 Hemiplegia and hemiparesis following cerebral infarction affecting right dominant side: Secondary | ICD-10-CM | POA: Diagnosis not present

## 2024-10-16 DIAGNOSIS — J449 Chronic obstructive pulmonary disease, unspecified: Secondary | ICD-10-CM | POA: Diagnosis present

## 2024-10-16 DIAGNOSIS — E1165 Type 2 diabetes mellitus with hyperglycemia: Secondary | ICD-10-CM | POA: Diagnosis present

## 2024-10-16 DIAGNOSIS — E1151 Type 2 diabetes mellitus with diabetic peripheral angiopathy without gangrene: Secondary | ICD-10-CM

## 2024-10-16 DIAGNOSIS — G4733 Obstructive sleep apnea (adult) (pediatric): Secondary | ICD-10-CM | POA: Diagnosis present

## 2024-10-16 DIAGNOSIS — E039 Hypothyroidism, unspecified: Secondary | ICD-10-CM | POA: Diagnosis present

## 2024-10-16 DIAGNOSIS — E1142 Type 2 diabetes mellitus with diabetic polyneuropathy: Secondary | ICD-10-CM | POA: Diagnosis present

## 2024-10-16 DIAGNOSIS — G9341 Metabolic encephalopathy: Secondary | ICD-10-CM | POA: Diagnosis present

## 2024-10-16 DIAGNOSIS — J9621 Acute and chronic respiratory failure with hypoxia: Secondary | ICD-10-CM | POA: Diagnosis present

## 2024-10-16 DIAGNOSIS — F32A Depression, unspecified: Secondary | ICD-10-CM | POA: Diagnosis present

## 2024-10-16 DIAGNOSIS — E870 Hyperosmolality and hypernatremia: Secondary | ICD-10-CM | POA: Diagnosis present

## 2024-10-16 DIAGNOSIS — Z66 Do not resuscitate: Secondary | ICD-10-CM | POA: Diagnosis not present

## 2024-10-16 DIAGNOSIS — Z789 Other specified health status: Secondary | ICD-10-CM | POA: Diagnosis not present

## 2024-10-16 DIAGNOSIS — Z7189 Other specified counseling: Secondary | ICD-10-CM | POA: Diagnosis not present

## 2024-10-16 DIAGNOSIS — G40909 Epilepsy, unspecified, not intractable, without status epilepticus: Secondary | ICD-10-CM | POA: Diagnosis present

## 2024-10-16 DIAGNOSIS — E785 Hyperlipidemia, unspecified: Secondary | ICD-10-CM | POA: Diagnosis present

## 2024-10-16 DIAGNOSIS — Z794 Long term (current) use of insulin: Secondary | ICD-10-CM | POA: Diagnosis not present

## 2024-10-16 DIAGNOSIS — J441 Chronic obstructive pulmonary disease with (acute) exacerbation: Secondary | ICD-10-CM | POA: Diagnosis not present

## 2024-10-16 DIAGNOSIS — C3411 Malignant neoplasm of upper lobe, right bronchus or lung: Secondary | ICD-10-CM | POA: Diagnosis present

## 2024-10-16 LAB — CBC
HCT: 37.8 % (ref 36.0–46.0)
Hemoglobin: 11.5 g/dL — ABNORMAL LOW (ref 12.0–15.0)
MCH: 26.1 pg (ref 26.0–34.0)
MCHC: 30.4 g/dL (ref 30.0–36.0)
MCV: 85.7 fL (ref 80.0–100.0)
Platelets: 302 K/uL (ref 150–400)
RBC: 4.41 MIL/uL (ref 3.87–5.11)
RDW: 15.3 % (ref 11.5–15.5)
WBC: 8.2 K/uL (ref 4.0–10.5)
nRBC: 0 % (ref 0.0–0.2)

## 2024-10-16 LAB — BLOOD GAS, ARTERIAL
Acid-Base Excess: 11.5 mmol/L — ABNORMAL HIGH (ref 0.0–2.0)
Bicarbonate: 37 mmol/L — ABNORMAL HIGH (ref 20.0–28.0)
O2 Saturation: 57 %
Patient temperature: 37
pCO2 arterial: 52 mmHg — ABNORMAL HIGH (ref 32–48)
pH, Arterial: 7.46 — ABNORMAL HIGH (ref 7.35–7.45)
pO2, Arterial: 32 mmHg — CL (ref 83–108)

## 2024-10-16 LAB — BASIC METABOLIC PANEL WITH GFR
Anion gap: 10 (ref 5–15)
BUN: 21 mg/dL (ref 8–23)
CO2: 32 mmol/L (ref 22–32)
Calcium: 12 mg/dL — ABNORMAL HIGH (ref 8.9–10.3)
Chloride: 102 mmol/L (ref 98–111)
Creatinine, Ser: 0.57 mg/dL (ref 0.44–1.00)
GFR, Estimated: >60 mL/min (ref >60)
Glucose, Bld: 319 mg/dL — ABNORMAL HIGH (ref 70–99)
Potassium: 4.6 mmol/L (ref 3.5–5.1)
Sodium: 144 mmol/L (ref 135–145)

## 2024-10-16 LAB — GLUCOSE, CAPILLARY
Glucose-Capillary: 299 mg/dL — ABNORMAL HIGH (ref 70–99)
Glucose-Capillary: 264 mg/dL — ABNORMAL HIGH (ref 70–99)
Glucose-Capillary: 161 mg/dL — ABNORMAL HIGH (ref 70–99)
Glucose-Capillary: 150 mg/dL — ABNORMAL HIGH (ref 70–99)

## 2024-10-16 LAB — LACTIC ACID, PLASMA: Lactic Acid, Venous: 2.7 mmol/L (ref 0.5–1.9)

## 2024-10-16 MED ORDER — CALCITONIN (SALMON) 200 UNIT/ML IJ SOLN
4.0000 [IU]/kg | Freq: Two times a day (BID) | INTRAMUSCULAR | Status: AC
  Administered 2024-10-16 – 2024-10-17 (×2): 266 [IU] via SUBCUTANEOUS
  Filled 2024-10-16 (×8): qty 1.33

## 2024-10-16 MED ORDER — CALCITONIN (SALMON) 200 UNIT/ACT NA SOLN: 1.0000 | Freq: Every day | NASAL | Status: DC

## 2024-10-16 MED ORDER — OXYCODONE-ACETAMINOPHEN 5-325 MG PO TABS
0.5000 | ORAL_TABLET | ORAL | Status: DC | PRN
  Administered 2024-10-16 – 2024-10-19 (×2): 0.5 via ORAL
  Filled 2024-10-16 (×2): qty 1

## 2024-10-16 MED ORDER — INSULIN ASPART 100 UNIT/ML IJ SOLN: 0.0000 [IU] | Freq: Every day | INTRAMUSCULAR | Status: DC

## 2024-10-16 MED ORDER — INSULIN ASPART 100 UNIT/ML IJ SOLN
4.0000 [IU] | Freq: Three times a day (TID) | INTRAMUSCULAR | Status: DC
  Administered 2024-10-17 – 2024-10-18 (×3): 4 [IU] via SUBCUTANEOUS
  Filled 2024-10-16 (×3): qty 4

## 2024-10-16 MED ORDER — INSULIN ASPART 100 UNIT/ML IJ SOLN
0.0000 [IU] | Freq: Three times a day (TID) | INTRAMUSCULAR | Status: DC
  Administered 2024-10-16: 13:00:00 5 [IU] via SUBCUTANEOUS
  Administered 2024-10-16 – 2024-10-18 (×3): 2 [IU] via SUBCUTANEOUS
  Administered 2024-10-18: 08:00:00 1 [IU] via SUBCUTANEOUS
  Filled 2024-10-16 (×2): qty 2
  Filled 2024-10-16: qty 1
  Filled 2024-10-16: qty 2
  Filled 2024-10-16: qty 5

## 2024-10-16 MED ORDER — CHLORHEXIDINE GLUCONATE CLOTH 2 % EX PADS
6.0000 | MEDICATED_PAD | Freq: Every day | CUTANEOUS | Status: DC
  Administered 2024-10-16 – 2024-10-21 (×6): 6 via TOPICAL

## 2024-10-16 NOTE — Evaluation (Signed)
 Clinical/Bedside Swallow Evaluation Patient Details  Name: Michele House MRN: [id number] Date of Birth: [birth year]  Today's Date: 10/16/2024 Time: SLP Start Time (ACUTE ONLY): 0930 SLP Stop Time (ACUTE ONLY): 0955 SLP Time Calculation (min) (ACUTE ONLY): 25 min  Past Medical History:  Past Medical History:  Diagnosis Date   Acid reflux    COPD (chronic obstructive pulmonary disease) (HCC)    Diabetes mellitus without complication (HCC)    Hyperlipidemia    Hypertension    Stroke (HCC)    Thyroid  disease    Past Surgical History:  Past Surgical History:  Procedure Laterality Date   appendectomy     APPENDECTOMY     BREAST BIOPSY Right    CORE W/CLIP - NEG   BRONCHIAL BIOPSY Left 09/22/2024   Procedure: BRONCHOSCOPY, WITH BIOPSY;  Surgeon: Parris Manna, MD;  Location: [facility] ORS;  Service: Thoracic;  Laterality: Left;   ESOPHAGOGASTRODUODENOSCOPY N/A 10/28/2023   Procedure: ESOPHAGOGASTRODUODENOSCOPY (EGD);  Surgeon: Jinny Carmine, MD;  Location: [facility] ENDOSCOPY;  Service: Endoscopy;  Laterality: N/A;   FLEXIBLE BRONCHOSCOPY Bilateral 11/12/2023   Procedure: FLEXIBLE BRONCHOSCOPY;  Surgeon: Parris Manna, MD;  Location: [facility] ORS;  Service: Thoracic;  Laterality: Bilateral;   IR EMBO ART  VEN HEMORR LYMPH EXTRAV  INC GUIDE ROADMAPPING  10/30/2023   IR IMAGING GUIDED PORT INSERTION  01/18/2024   IVC FILTER INSERTION N/A 11/09/2023   Procedure: IVC FILTER INSERTION;  Surgeon: Marea Selinda RAMAN, MD;  Location: [facility] INVASIVE CV LAB;  Service: Cardiovascular;  Laterality: N/A;   TOTAL VAGINAL HYSTERECTOMY     TUMOR REMOVAL     benign;stomach   VIDEO BRONCHOSCOPY WITH ENDOBRONCHIAL NAVIGATION Right 10/20/2023   Procedure: VIDEO BRONCHOSCOPY WITH ENDOBRONCHIAL NAVIGATION;  Surgeon: Parris Manna, MD;  Location: [facility] ORS;  Service: Thoracic;  Laterality: Right;   VIDEO BRONCHOSCOPY WITH ENDOBRONCHIAL NAVIGATION Right 11/12/2023   Procedure: VIDEO BRONCHOSCOPY WITH ENDOBRONCHIAL  NAVIGATION;  Surgeon: Parris Manna, MD;  Location: [facility] ORS;  Service: Thoracic;  Laterality: Right;   HPI:  Michele House is a 68 y.o. female with medical history significant for GERD, COPD, type 2 diabetes mellitus, pretension, dyslipidemia, hypothyroidism and CVA, who presented to the emergency room with acute onset of altered mental status which has been worsening over the last 3 to 4 days with increased confusion and decreased responsiveness.  She has baseline weakness of both lower extremities in the right upper extremity from previous CVA.  She is usually verbal in the last few days has not been communicating much.  She was admitted here recently from 11/16 until 09/22/2024 for management of sepsis due to pneumonia with atelectasis of the left lower lobe with associated non-small cell lung cancer, COPD exacerbation and acute on chronic respiratory failure with hypoxia and hypercarbia, hyperkalemia and was discharged to her SNF.  She underwent a bronchoscopy with removal of heavy amounts of mucous plugging with improvement of her oxygenation and BAL specimens later grew Aspergillus fumigatus and ESBL E. coli sensitive to meropenem and to less extent Unasyn .  She has apparently been stable since discharge until her last 4 days.  No reported fever or chills.  No reported nausea or vomiting or abdominal pain.  No dysuria, oliguria or hematuria or flank pain or reported.  The patient was fairly somnolent and therefore not on her historian. CT Head: No acute intracranial abnormality. CXR 10/15/24 (comparison to last admission): Similar right upper lung mass and bibasilar opacities.  2. Similar diffuse interstitial prominence.  3. Small right pleural  effusion.    Assessment / Plan / Recommendation  Clinical Impression  Pt seen for bedside swallow evaluation in the setting of RN concern for pocketing/holding with PO intake. Pt known to SLP service, with last intervention completed in Nov of 2025, with  recommendations for mech soft solids and thin liquids. At baseline, pt on 3L nasal canula.   Today, pt on 8L high flow nasal canula, awake upon therapist entrance, with sparse verbal utterances. Limited intelligibility/context for verbal utterances. Pt reporting minimal desire for any trials from breakfast tray. Limited solids trialed (puree and regular solids), with noted prolonged oral manipulation and clearance for regular solids, suspect related to reduced awareness of bolus, limited attention to task of eating, and limited dentition. Pt eagerly accepted water  via straw- min impulsivity noted with large, quick sips. No overt or subtle s/sx pharyngeal dysphagia noted. No change to vocal quality across trials.   Pt with multiple risk factors for aspiration/adverse side effects in the event of aspiration, including hx of GERD, impaired pulmonary function (COPD, small cell lung cancer), CVA, failure to thrive, cute AMS (acute metabolic encephalopathy), and dependency for feeding. Recommend chopped solids (to aid mastication, ease of intake) and thin liquids. STRICT aspiration precautions apply (slow rate, small bites, elevated HOB, alert for PO intake). Further alternate solids and liquids and intermittently assess for oral clearance. Medications whole vs crushed in puree. MD and RN aware of recommendations. SLP will continue to follow.   SLP Visit Diagnosis: Dysphagia, unspecified (R13.10) (related to acute on chronic pulmonary status, mentation, deconditioning.)    Aspiration Risk  Moderate aspiration risk    Diet Recommendation   Thin;Dysphagia 2 (chopped)  Medication Administration: Crushed with puree    Other Recommendations Recommended Consults:  (palliative) Oral Care Recommendations: Oral care before and after PO;Staff/trained caregiver to provide oral care      Assistance Recommended at Discharge  Total assist for PO intake  Functional Status Assessment Patient has had a recent decline  in their functional status and demonstrates the ability to make significant improvements in function in a reasonable and predictable amount of time.  Frequency and Duration min 2x/week  2 weeks       Prognosis Prognosis for improved oropharyngeal function: Fair Barriers to Reach Goals: Severity of deficits;Time post onset      Swallow Study   General Date of Onset: 10/16/24 HPI: Michele House is a 68 y.o. female with medical history significant for GERD, COPD, type 2 diabetes mellitus, pretension, dyslipidemia, hypothyroidism and CVA, who presented to the emergency room with acute onset of altered mental status which has been worsening over the last 3 to 4 days with increased confusion and decreased responsiveness.  She has baseline weakness of both lower extremities in the right upper extremity from previous CVA.  She is usually verbal in the last few days has not been communicating much.  She was admitted here recently from 11/16 until 09/22/2024 for management of sepsis due to pneumonia with atelectasis of the left lower lobe with associated non-small cell lung cancer, COPD exacerbation and acute on chronic respiratory failure with hypoxia and hypercarbia, hyperkalemia and was discharged to her SNF.  She underwent a bronchoscopy with removal of heavy amounts of mucous plugging with improvement of her oxygenation and BAL specimens later grew Aspergillus fumigatus and ESBL E. coli sensitive to meropenem and to less extent Unasyn .  She has apparently been stable since discharge until her last 4 days.  No reported fever or chills.  No reported nausea or vomiting or abdominal pain.  No dysuria, oliguria or hematuria or flank pain or reported.  The patient was fairly somnolent and therefore not on her historian. CT Head: No acute intracranial abnormality. CXR 10/15/24 (comparison to last admission): Similar right upper lung mass and bibasilar opacities.  2. Similar diffuse interstitial prominence.  3.  Small right pleural effusion. Type of Study: Bedside Swallow Evaluation Previous Swallow Assessment: seen by SLP last admission (Nov 2025) with recommendations for mech soft and thin liquids Diet Prior to this Study: Regular;Thin liquids (Level 0) Temperature Spikes Noted: No (98.8, WBC 8.2) Respiratory Status: Nasal cannula (8L) History of Recent Intubation: No Behavior/Cognition: Alert;Confused;Distractible;Requires cueing Oral Cavity Assessment: Dry Oral Care Completed by SLP: Yes Oral Cavity - Dentition: Missing dentition Self-Feeding Abilities: Total assist Patient Positioning: Upright in bed Baseline Vocal Quality: Low vocal intensity Volitional Cough: Cognitively unable to elicit Volitional Swallow: Unable to elicit    Oral/Motor/Sensory Function Overall Oral Motor/Sensory Function: Within functional limits   Ice Chips Ice chips: Not tested   Thin Liquid Thin Liquid: Within functional limits Presentation: Straw    Nectar Thick Nectar Thick Liquid: Not tested   Honey Thick Honey Thick Liquid: Not tested   Puree Puree: Within functional limits Presentation: Spoon   Solid     Solid: Impaired Oral Phase Impairments: Impaired mastication;Poor awareness of bolus Oral Phase Functional Implications: Oral residue;Prolonged oral transit;Impaired mastication Pharyngeal Phase Impairments:  (none)     Chamia Schmutz Clapp, MS, CCC-SLP Speech Language Pathologist Rehab Services; [facility] [phone] (ascom)   Rayleen Wyrick J Clapp 10/16/2024,11:05 AM

## 2024-10-16 NOTE — TOC Progression Note (Signed)
 Transition of Care [facility]) - Progression Note    Patient Details  Name: Michele House MRN: [id number] Date of Birth: 08-10-1956  [facility] of [facility] CM/SW Contact  Dalia GORMAN Fuse, RN Phone Number: 10/16/2024, 10:32 AM  Clinical Narrative:     TOC received a message from the MD advising the patient will need outpatient BIPAP. The patient has a diagnosis of COPD and Resp Failure. TOC spoke with Mitch with [employer] and he will look into the criteria for BIPAP/NIV. TOC informed Mitch that an ABG has resulted. [name] will start to work on the orders and will keep TOC informed of the progress.                    Expected Discharge Plan and Services                                               Social Drivers of Health (SDOH) Interventions SDOH Screenings   Food Insecurity: Patient Unable To Answer (10/16/2024)  Housing: Patient Unable To Answer (10/16/2024)  Transportation Needs: Patient Unable To Answer (10/16/2024)  Utilities: Patient Unable To Answer (10/16/2024)  Depression (PHQ2-9): Low Risk (08/11/2024)  Financial Resource Strain: Low Risk  (10/05/2024)   Received from [facility]  Social Connections: Patient Unable To Answer (10/16/2024)  Recent Concern: Social Connections - Socially Isolated (09/01/2024)  Tobacco Use: Medium Risk (10/15/2024)    Readmission Risk Interventions     No data to display

## 2024-10-16 NOTE — Assessment & Plan Note (Signed)
 With improved mental status her BuSpar  and Ativan  as well as Zoloft  can be resumed.

## 2024-10-16 NOTE — Hospital Course (Addendum)
 Michele House is a 68 y.o. female with medical history significant for GERD, COPD, type 2 diabetes mellitus, pretension, dyslipidemia, hypothyroidism and CVA, who presented to the emergency room with acute onset of altered mental status which has been worsening over the last 3 to 4 days with increased confusion and decreased responsiveness  Based on conversation with son and daughter, patient has been bedbound, living in long-term care facility, she has a baseline confusion.  She was on 3 L oxygen chronically, but does not wear a BiPAP or CPAP. Lab results showed hypercalcemia, also has severe hypoxia and hypercapnia on ABG. Metabolic encephalopathy is due to combined hypercalcemia and hypercapnia.

## 2024-10-16 NOTE — Assessment & Plan Note (Addendum)
-   This is of unclear etiology. - She will be admitted to an observation medical telemetry bed. - Will follow neurochecks every 4 hours for 24 hours. - Will hold off sedatives. - With no improvement of mental status neurology consult can be contemplated in AM. - Given her previous history of aspergillus fumigatus lung infection we will continue her voriconazole .

## 2024-10-16 NOTE — Progress Notes (Signed)
 IV Team consulted for assistance with dressing change. Per chart- pt with SL PICC line, though unable to observe on most recent chest x-ray. This RN to bedside to assess. Pt with Left midline- assisted primary RN with dressing. Chart updated/corrected to reflect current line.

## 2024-10-16 NOTE — Progress Notes (Addendum)
 Progress Note   Patient: Michele House FMW:[id number] DOB: [birth year] DOA: 10/15/2024     0 DOS: the patient was seen and examined on 10/16/2024   Brief hospital course: Michele House is a 68 y.o. female with medical history significant for GERD, COPD, type 2 diabetes mellitus, pretension, dyslipidemia, hypothyroidism and CVA, who presented to the emergency room with acute onset of altered mental status which has been worsening over the last 3 to 4 days with increased confusion and decreased responsiveness  Based on conversation with son and daughter, patient has been bedbound, living in long-term care facility, she has a baseline confusion.  She was on 3 L oxygen chronically, but does not wear a BiPAP or CPAP. Lab results showed hypercalcemia, also has severe hypoxia and hypercapnia on ABG. Metabolic encephalopathy is due to combined hypercalcemia and hypercapnia.   Principal Problem:   Acute encephalopathy Active Problems:   CVA (cerebral vascular accident) (HCC)   Acute on chronic respiratory failure with hypoxia and hypercapnia (HCC)   Diabetes mellitus (HCC)   COPD (chronic obstructive pulmonary disease) (HCC)   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)   Anxiety and depression   Type 2 diabetes mellitus with peripheral angiopathy (HCC)   Hypercalcemia   Assessment and Plan: Acute metabolic encephalopathy. This appears to be secondary to hypercalcemia as well as respite failure with the severe hypoxemia and hypercapnia. Patient most likely has untreated obstructive sleep apnea.  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 Patiently placed on high flow oxygen to maintain oxygen saturation, she will be placed on BiPAP nightly. She will be also treated with calcitonin for hypercalcemia.  Acute on chronic respiratory failure with hypoxemia and hypercapnia. Severe COPD. Likely obstructive sleep apnea. Patient does not seem to have COPD  exacerbation, but she has severe hypoxemia and hypercapnia on ABG, this is a probably accumulating over the course of last week. Patient is placed on high flow oxygen to maintain oxygen saturation, also start BiPAP nightly. Will work on outpatient BiPAP.  History of stroke with right hemiparesis. Failure to thrive. Bedbound status. Patient condition has been deteriorating over the last year, she has been bedbound since her last stroke. She does not have any potential for rehab.  Long-term prognosis is very poor.  Small cell lung cancer. Hypercalcemia secondary to lung cancer. Patient altered mental status patient is due to hypercalcemia.  Will continue some fluids, started on calcitonin sq injection.   Uncontrolled type 2 diabetes with hyperglycemia on chronic insulin  use. Glucose running high again, continue home dose insulin  glargine, scheduled NovoLog , also added sliding scale insulin .  Overweight. BMI 27.78.  Goal of care discussion. Discussed the case with the patient daughter and the patient's son, son is a management consultant.  Patient has very poor prognosis, with a lung cancer, severe debility, functional decline, long-term prognosis very poor, probably less than 6 months.  Family has made the patient DNR in the past, but the patient still wants full code.  Currently, patient lacks decision-making capacity due to altered mental status.  Will discuss again when patient wake up, if her condition does not improve, son can make a decision.  Due to poor prognosis, obtain palliative care consult.     Subjective:  Patient still very sleepy, only talking a few words.    Physical Exam: Vitals:   10/16/24 0500 10/16/24 0600 10/16/24 0814 10/16/24 0830  BP:  114/61 112/69   Pulse:  (!) 101 (!) 110  Resp:   18   Temp:  98.7 F (37.1 C) 98.3 F (36.8 C)   TempSrc: Oral Oral    SpO2:  100% 93% 93%  Weight:      Height:       General exam: Appears calm and comfortable  Respiratory  system: Significant decreased breathing sounds without crackles or wheezes. Respiratory effort normal. Cardiovascular system: S1 & S2 heard, RRR. No JVD, murmurs, rubs, gallops or clicks. No pedal edema. Gastrointestinal system: Abdomen is nondistended, soft and nontender. No organomegaly or masses felt. Normal bowel sounds heard. Central nervous system: Very drowsy and oriented x 2. No focal neurological deficits. Extremities: Symmetric 5 x 5 power. Skin: No rashes, lesions or ulcers Psychiatry: Flat affect.   Data Reviewed:  Lab results reviewed.  Family Communication: Both son and daughter updated over the phone  Disposition: Status is: Observation The patient will require care spanning > 2 midnights and should be moved to inpatient because: Severity of disease.     Time spent: 60 minutes  Author: Murvin Mana, MD 10/16/2024 10:23 AM  For on call review [email or web address].

## 2024-10-17 DIAGNOSIS — J9622 Acute and chronic respiratory failure with hypercapnia: Secondary | ICD-10-CM | POA: Diagnosis not present

## 2024-10-17 DIAGNOSIS — J9621 Acute and chronic respiratory failure with hypoxia: Secondary | ICD-10-CM | POA: Diagnosis not present

## 2024-10-17 DIAGNOSIS — R7989 Other specified abnormal findings of blood chemistry: Secondary | ICD-10-CM | POA: Insufficient documentation

## 2024-10-17 DIAGNOSIS — J441 Chronic obstructive pulmonary disease with (acute) exacerbation: Secondary | ICD-10-CM | POA: Diagnosis not present

## 2024-10-17 LAB — GLUCOSE, CAPILLARY
Glucose-Capillary: 117 mg/dL — ABNORMAL HIGH (ref 70–99)
Glucose-Capillary: 131 mg/dL — ABNORMAL HIGH (ref 70–99)
Glucose-Capillary: 166 mg/dL — ABNORMAL HIGH (ref 70–99)
Glucose-Capillary: 79 mg/dL (ref 70–99)
Glucose-Capillary: 83 mg/dL (ref 70–99)
Glucose-Capillary: 97 mg/dL (ref 70–99)

## 2024-10-17 LAB — BASIC METABOLIC PANEL WITH GFR
Anion gap: 10 (ref 5–15)
BUN: 20 mg/dL (ref 8–23)
CO2: 31 mmol/L (ref 22–32)
Calcium: 10.8 mg/dL — ABNORMAL HIGH (ref 8.9–10.3)
Chloride: 105 mmol/L (ref 98–111)
Creatinine, Ser: 0.54 mg/dL (ref 0.44–1.00)
GFR, Estimated: >60 mL/min (ref >60)
Glucose, Bld: 156 mg/dL — ABNORMAL HIGH (ref 70–99)
Potassium: 4.1 mmol/L (ref 3.5–5.1)
Sodium: 146 mmol/L — ABNORMAL HIGH (ref 135–145)

## 2024-10-17 LAB — CALCIUM, IONIZED: Calcium, Ionized, Serum: 6.5 mg/dL — ABNORMAL HIGH (ref 4.5–5.6)

## 2024-10-17 NOTE — Plan of Care (Signed)

## 2024-10-17 NOTE — Consult Note (Cosign Needed Addendum)
 Consultation Note Date: 10/17/2024   Patient Name: Michele House  DOB: 19-Nov-1955  MRN: [id number]  Age / Sex: 68 y.o., female  PCP: [facility] Referring Physician: Laurita Pillion, MD  Reason for Consultation: Establishing goals of care  HPI/Patient Profile: Per notes: Michele House is a 68 y.o. female with medical history significant for GERD, COPD, type 2 diabetes mellitus, pretension, dyslipidemia, hypothyroidism and CVA, who presented to the emergency room with acute onset of altered mental status which has been worsening over the last 3 to 4 days with increased confusion and decreased responsiveness  Based on conversation with son and daughter, patient has been bedbound, living in long-term care facility, she has a baseline confusion.  She was on 3 L oxygen chronically, but does not wear a BiPAP or CPAP. Lab results showed hypercalcemia, also has severe hypoxia and hypercapnia on ABG. Metabolic encephalopathy is due to combined hypercalcemia and hypercapnia.  Clinical Assessment and Goals of Care: Patient is known to the PMT team from previous admissions. Notes and labs reviewed from current and previous admissions.  Previously patient decided on full code and full scope care.  In to see patient.  She is currently sitting in bed at this time with a work of breathing noted.  She complains of left leg pain with trying to readjust in the bed; she discusses injuring that leg years ago.  She requests assistance with drinking water  as she is unable to pick up the cup and drink from it; assistance provided.    Talking to her, she states she is sure if she would feel better if she can just get her albuterol  treatment.  Nursing in to bedside to provide this.  Discussed that PMT will follow-up with her tomorrow.    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 The following aspects are pertinent.  Past Medical History:  Diagnosis Date   Acid reflux    COPD (chronic obstructive pulmonary disease) (HCC)    Diabetes mellitus without complication (HCC)    Hyperlipidemia    Hypertension    Stroke (HCC)    Thyroid  disease    Social History   Socioeconomic History   Marital status: Single    Spouse name: Not on file   Number of children: Not on file   Years of education: Not on file   Highest education level: Not on file  Occupational History   Not on file  Tobacco Use   Smoking status: Former    Current packs/day: 0.00    Types: Cigarettes    Quit date: 12/15/1985    Years since quitting: 38.8  Smokeless tobacco: Never  Substance and Sexual Activity   Alcohol use: No   Drug use: No   Sexual activity: Not Currently  Other Topics Concern   Not on file  Social History Narrative   Not on file   Social Drivers of Health   Tobacco Use: Medium Risk (10/15/2024)   Patient History    Smoking Tobacco Use: Former    Smokeless Tobacco Use: Never    Passive Exposure: Not on file  Financial Resource Strain: Low Risk  (10/05/2024)   Received from [facility]   Overall Financial Resource Strain (CARDIA)    Difficulty of Paying Living Expenses: Not hard at all  Food Insecurity: Patient Unable To Answer (10/16/2024)   Epic    Worried About Programme Researcher, Broadcasting/film/video in the Last Year: Patient unable to answer    Ran Out of Food in the Last Year: Patient unable to answer  Transportation Needs: Patient Unable To Answer (10/16/2024)   Epic    Lack of  Transportation (Medical): Patient unable to answer    Lack of Transportation (Non-Medical): Patient unable to answer  Physical Activity: Not on file  Stress: Not on file  Social Connections: Patient Unable To Answer (10/16/2024)   Social Connection and Isolation Panel    Frequency of Communication with Friends and Family: Patient unable to answer    Frequency of Social Gatherings with Friends and Family: Patient unable to answer    Attends Religious Services: Patient unable to answer    Active Member of Clubs or Organizations: Patient unable to answer    Attends Banker Meetings: Patient unable to answer    Marital Status: Patient unable to answer  Recent Concern: Social Connections - Socially Isolated (09/01/2024)   Social Connection and Isolation Panel    Frequency of Communication with Friends and Family: Never    Frequency of Social Gatherings with Friends and Family: Never    Attends Religious Services: Never    Database Administrator or Organizations: No    Attends Banker Meetings: Never    Marital Status: Widowed  Depression (PHQ2-9): Low Risk (08/11/2024)   Depression (PHQ2-9)    PHQ-2 Score: 1  Alcohol Screen: Not on file  Housing: Patient Unable To Answer (10/16/2024)   Epic    Unable to Pay for Housing in the Last Year: Patient unable to answer    Number of Times Moved in the Last Year: Not on file    Homeless in the Last Year: Patient unable to answer  Utilities: Patient Unable To Answer (10/16/2024)   Epic    Threatened with loss of utilities: Patient unable to answer  Health Literacy: Not on file   Family History  Problem Relation Age of Onset   Heart attack Mother    Hypertension Mother    Breast cancer Sister 70   Breast cancer Maternal Aunt        40'S   Breast cancer Maternal Grandmother    Scheduled Meds:  (feeding supplement) PROSource Plus  30 mL Oral BID BM   aspirin  EC  81 mg Oral Daily   atorvastatin   20 mg Oral Daily    busPIRone   20 mg Oral BID   calcitonin  4 Units/kg Subcutaneous BID   Chlorhexidine  Gluconate Cloth  6 each Topical Daily   collagenase    Topical Daily   enoxaparin  (LOVENOX ) injection  40 mg Subcutaneous Q24H   feeding supplement (GLUCERNA SHAKE)  237 mL Oral BID  BM   ferrous sulfate   325 mg Oral Q breakfast   insulin  aspart  0-5 Units Subcutaneous QHS   insulin  aspart  0-9 Units Subcutaneous TID WC   insulin  aspart  4 Units Subcutaneous TID WC   insulin  glargine  18 Units Subcutaneous Daily   ipratropium-albuterol   3 mL Nebulization BID   levETIRAcetam   500 mg Oral BID   levothyroxine   125 mcg Oral Q0600   linagliptin   5 mg Oral Daily   loratadine   10 mg Oral Daily   metoprolol  succinate  50 mg Oral Daily   multivitamin with minerals  1 tablet Oral Daily   nutrition supplement (JUVEN)  1 packet Oral BID BM   pantoprazole   40 mg Oral BID AC   pregabalin   50 mg Oral BID   rOPINIRole   4 mg Oral QHS   senna  1 tablet Oral BID   sertraline   100 mg Oral Daily   Continuous Infusions: PRN Meds:.acetaminophen  **OR** acetaminophen , alum & mag hydroxide-simeth, ondansetron  **OR** ondansetron  (ZOFRAN ) IV, oxyCODONE -acetaminophen , traZODone  Medications Prior to Admission:  Prior to Admission medications  Medication Sig Start Date End Date Taking? Authorizing Provider  acetaminophen  (TYLENOL ) 325 MG tablet Take 650 mg by mouth every 6 (six) hours as needed.   Yes [provider]  alum & mag hydroxide-simeth (MAALOX PLUS) 400-400-40 MG/5ML suspension Take 10 mLs by mouth every 6 (six) hours as needed for indigestion.   Yes [provider]  aspirin  EC 81 MG tablet Take 81 mg by mouth daily.   Yes [provider]  atorvastatin  (LIPITOR) 20 MG tablet Take 20 mg by mouth daily. 06/25/23  Yes [provider]  busPIRone  (BUSPAR ) 10 MG tablet Take 20 mg by mouth 2 (two) times daily.   Yes [provider]  calcium  carbonate (TUMS - DOSED IN MG ELEMENTAL  CALCIUM ) 500 MG chewable tablet Chew 1 tablet by mouth daily.   Yes [provider]  cetirizine (ZYRTEC) 10 MG tablet Take 10 mg by mouth daily.   Yes [provider]  collagenase  (SANTYL ) 250 UNIT/GM ointment Apply topically daily. 09/04/24  Yes Patel, Sona, MD  DULERA 200-5 MCG/ACT AERO Inhale 2 puffs into the lungs every 12 (twelve) hours. 09/06/23  Yes [provider]  ferrous sulfate  325 (65 FE) MG tablet Take 1 tablet (325 mg total) by mouth daily with breakfast. 09/22/24  Yes Maree Hue, MD  furosemide  (LASIX ) 20 MG tablet Take 1 tablet (20 mg total) by mouth daily. 09/27/23  Yes Shona Terry SAILOR, DO  glipiZIDE  (GLUCOTROL ) 5 MG tablet Take 5 mg by mouth daily before breakfast.   Yes [provider]  insulin  aspart (NOVOLOG ) 100 UNIT/ML injection Inject 4 Units into the skin 3 (three) times daily with meals. 12/02/23  Yes Awanda City, MD  insulin  glargine-yfgn (SEMGLEE ) 100 UNIT/ML injection Inject 0.18 mLs (18 Units total) into the skin daily. 12/03/23  Yes Awanda City, MD  ipratropium (ATROVENT HFA) 17 MCG/ACT inhaler Inhale 2 puffs into the lungs every 6 (six) hours as needed.   Yes [provider]  ipratropium-albuterol  (DUONEB) 0.5-2.5 (3) MG/3ML SOLN Take 3 mLs by nebulization 2 (two) times daily.   Yes [provider]  levETIRAcetam  (KEPPRA ) 500 MG tablet Take 1 tablet (500 mg total) by mouth 2 (two) times daily. 12/02/23  Yes Awanda City, MD  levothyroxine  (SYNTHROID ) 125 MCG tablet Take 125 mcg by mouth every morning.   Yes [provider]  LORazepam  (ATIVAN ) 0.5 MG tablet Take  0.5 mg by mouth every 8 (eight) hours as needed for anxiety.   Yes [provider]  metoprolol  succinate (TOPROL -XL) 50 MG 24 hr tablet Take 50 mg by mouth daily. Take with or immediately following a meal.   Yes [provider]  Multiple Vitamin (MULTIVITAMIN) capsule Take 1 capsule by mouth daily.   Yes [provider]  nutrition  supplement, JUVEN, (JUVEN) PACK Take 1 packet by mouth 2 (two) times daily between meals. 09/04/24  Yes Patel, Sona, MD  nystatin  (MYCOSTATIN /NYSTOP ) powder Apply 1 Application topically 2 (two) times daily.   Yes [provider]  ondansetron  (ZOFRAN ) 4 MG/5ML solution Take 8 mg by mouth every 8 (eight) hours as needed for refractory nausea / vomiting.   Yes [provider]  pantoprazole  (PROTONIX ) 40 MG tablet Take 1 tablet (40 mg total) by mouth 2 (two) times daily before a meal. 12/02/23  Yes Awanda City, MD  polyethylene glycol (MIRALAX  / GLYCOLAX ) 17 g packet Take 17 g by mouth daily. 12/03/23  Yes Awanda City, MD  pregabalin  (LYRICA ) 50 MG capsule Take 50 mg by mouth 2 (two) times daily.   Yes [provider]  rOPINIRole  (REQUIP  XL) 4 MG 24 hr tablet Take 1 tablet (4 mg total) by mouth at bedtime. 12/02/23  Yes Awanda City, MD  senna (SENOKOT) 8.6 MG TABS tablet Take 1 tablet (8.6 mg total) by mouth in the morning and at bedtime. 03/03/24  Yes Agrawal, Kavita, MD  sertraline  (ZOLOFT ) 100 MG tablet Take 100 mg by mouth daily. 06/28/23  Yes [provider]  spironolactone  (ALDACTONE ) 25 MG tablet Take 25 mg by mouth daily. 09/14/24 09/14/25 Yes [provider]  TRADJENTA  5 MG TABS tablet Take 5 mg by mouth daily. 07/26/23  Yes [provider]  voriconazole  (VFEND ) 200 MG tablet Take 200 mg by mouth 2 (two) times daily. 10/06/24 10/20/24 Yes [provider]  ZINC  OXIDE, TOPICAL, 25 % OINT Apply 1 Application topically 2 (two) times daily.   Yes [provider]  feeding supplement, GLUCERNA SHAKE, (GLUCERNA SHAKE) LIQD Take 237 mLs by mouth 2 (two) times daily between meals. 09/04/24   Patel, Sona, MD  lidocaine -prilocaine  (EMLA ) cream Apply 1 Application topically once.    [provider]  Nutritional Supplements (,FEEDING SUPPLEMENT, PROSOURCE PLUS) liquid Take 30 mLs by mouth 2 (two) times daily between meals. 09/04/24   Patel, Sona,  MD   Allergies[1] Review of Systems  Respiratory:  Positive for shortness of breath.   Musculoskeletal:        Left leg pain    Physical Exam Pulmonary:     Comments: Some work of breathing noted Skin:    General: Skin is warm and dry.  Neurological:     Mental Status: She is alert.     Vital Signs: BP (!) 113/57 (BP Location: Left Wrist)   Pulse 91   Temp 98.3 F (36.8 C)   Resp 18   Ht 5' 1 (1.549 m)   Wt 66.7 kg   LMP 12/24/1998 Comment: prior to her hysterectomy  SpO2 91%   BMI 27.78 kg/m  Pain Scale: 0-10   Pain Score: 0-No pain   SpO2: SpO2: 91 % O2 Device:SpO2: 91 % O2 Flow Rate: .O2 Flow Rate (L/min): 2 L/min  IO: Intake/output summary:  Intake/Output Summary (Last 24 hours) at 10/17/2024 1225 Last data filed at 10/17/2024 0201 Gross per 24 hour  Intake 1650.37 ml  Output 150 ml  Net 1500.37  ml    LBM: Last BM Date : 10/16/24 Baseline Weight: Weight: 66.7 kg Most recent weight: Weight: 66.7 kg        Signed by: Camelia Lewis, NP   Please contact Palliative Medicine Team phone at [phone] for questions and concerns.  For individual provider: See Amion                 [1]  Allergies Allergen Reactions   Penicillins Hives   Aspirin  Other (See Comments)    Lips numb   Sulfa Antibiotics

## 2024-10-17 NOTE — Progress Notes (Signed)
 Progress Note   Patient: Michele House DOB: 11-26-1955 DOA: 10/15/2024     1 DOS: the patient was seen and examined on 10/17/2024   Brief hospital course: Michele House is a 68 y.o. female with medical history significant for GERD, COPD, type 2 diabetes mellitus, pretension, dyslipidemia, hypothyroidism and CVA, who presented to the emergency room with acute onset of altered mental status which has been worsening over the last 3 to 4 days with increased confusion and decreased responsiveness  Based on conversation with son and daughter, patient has been bedbound, living in long-term care facility, she has a baseline confusion.  She was on 3 L oxygen chronically, but does not wear a BiPAP or CPAP. Lab results showed hypercalcemia, also has severe hypoxia and hypercapnia on ABG. Metabolic encephalopathy is due to combined hypercalcemia and hypercapnia.   Principal Problem:   Acute encephalopathy Active Problems:   CVA (cerebral vascular accident) (HCC)   Hypernatremia   Acute on chronic respiratory failure with hypoxia and hypercapnia (HCC)   Diabetes mellitus (HCC)   COPD (chronic obstructive pulmonary disease) (HCC)   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)   Anxiety and depression   Type 2 diabetes mellitus with peripheral angiopathy (HCC)   Hypercalcemia   Elevated troponin   Assessment and Plan:  Acute metabolic encephalopathy. This appears to be secondary to hypercalcemia as well as respite failure with the severe hypoxemia and hypercapnia. Patient most likely has untreated obstructive sleep apnea.  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 Patiently placed on high flow oxygen to maintain oxygen saturation, she is also placed on BiPAP nightly. Calcium  level is better, patient was wearing BiPAP last night.  Mental status much better today.    Acute on chronic respiratory failure with hypoxemia and hypercapnia. Severe  COPD. Likely obstructive sleep apnea. Patient does not seem to have COPD exacerbation, but she has severe hypoxemia and hypercapnia on ABG, this is a probably accumulating over the course of last week. Patient is placed on high flow oxygen to maintain oxygen saturation, also start BiPAP nightly.    History of stroke with right hemiparesis. Failure to thrive. Bedbound status. Patient condition has been deteriorating over the last year, she has been bedbound since her last stroke. She does not have any potential for rehab.  Long-term prognosis is very poor. Patient has been seen by palliative care.   Small cell lung cancer. Hypercalcemia secondary to lung cancer. Patient altered mental status patient is due to hypercalcemia.  Calcitonin started yesterday, will continue for another day.  Recheck a BMP tomorrow.     Uncontrolled type 2 diabetes with hyperglycemia on chronic insulin  use. Glucose running high again, continue home dose insulin  glargine, scheduled NovoLog , also added sliding scale insulin .   Overweight.      Subjective:  Patient mental status better today, still requiring oxygen.  She was able to tolerate BiPAP last night.  Physical Exam: Vitals:   10/17/24 0018 10/17/24 0613 10/17/24 0724 10/17/24 1208  BP: 114/64 125/70 (!) 102/54 (!) 113/57  Pulse: 90 90 88 91  Resp: 18 17 16 18   Temp: 98.2 F (36.8 C) 97.8 F (36.6 C) (!) 97.4 F (36.3 C) 98.3 F (36.8 C)  TempSrc: Oral Oral Oral   SpO2: 95% 93% 95% 91%  Weight:      Height:       General exam: Appears calm and comfortable  Respiratory system: Decreased breath sounds. Respiratory effort  normal. Cardiovascular system: S1 & S2 heard, RRR. No JVD, murmurs, rubs, gallops or clicks. No pedal edema. Gastrointestinal system: Abdomen is nondistended, soft and nontender. No organomegaly or masses felt. Normal bowel sounds heard. Central nervous system: Alert and oriented x2. No focal neurological  deficits. Extremities: Symmetric 5 x 5 power. Skin: No rashes, lesions or ulcers Psychiatry:  Mood & affect appropriate.    Data Reviewed:  Lab results reviewed.  Family Communication: None  Disposition: Status is: Inpatient Remains inpatient appropriate because: Severity of disease altered mental status     Time spent: 35 minutes  Author: Murvin Mana, MD 10/17/2024 1:24 PM  For on call review [email or web address].

## 2024-10-18 ENCOUNTER — Other Ambulatory Visit: Payer: Self-pay

## 2024-10-18 DIAGNOSIS — G9340 Encephalopathy, unspecified: Secondary | ICD-10-CM | POA: Diagnosis not present

## 2024-10-18 DIAGNOSIS — Z7189 Other specified counseling: Secondary | ICD-10-CM | POA: Diagnosis not present

## 2024-10-18 LAB — MAGNESIUM: Magnesium: 1.9 mg/dL (ref 1.7–2.4)

## 2024-10-18 LAB — GLUCOSE, CAPILLARY
Glucose-Capillary: 127 mg/dL — ABNORMAL HIGH (ref 70–99)
Glucose-Capillary: 151 mg/dL — ABNORMAL HIGH (ref 70–99)
Glucose-Capillary: 156 mg/dL — ABNORMAL HIGH (ref 70–99)
Glucose-Capillary: 213 mg/dL — ABNORMAL HIGH (ref 70–99)
Glucose-Capillary: 43 mg/dL — CL (ref 70–99)
Glucose-Capillary: 65 mg/dL — ABNORMAL LOW (ref 70–99)

## 2024-10-18 LAB — CALCIUM, IONIZED: Calcium, Ionized, Serum: 5.8 mg/dL — ABNORMAL HIGH (ref 4.5–5.6)

## 2024-10-18 LAB — BASIC METABOLIC PANEL WITH GFR
Anion gap: 7 (ref 5–15)
BUN: 12 mg/dL (ref 8–23)
CO2: 34 mmol/L — ABNORMAL HIGH (ref 22–32)
Calcium: 10 mg/dL (ref 8.9–10.3)
Chloride: 103 mmol/L (ref 98–111)
Creatinine, Ser: 0.4 mg/dL — ABNORMAL LOW (ref 0.44–1.00)
GFR, Estimated: >60 mL/min (ref >60)
Glucose, Bld: 104 mg/dL — ABNORMAL HIGH (ref 70–99)
Potassium: 3.7 mmol/L (ref 3.5–5.1)
Sodium: 143 mmol/L (ref 135–145)

## 2024-10-18 MED ORDER — POLYETHYLENE GLYCOL 3350 17 G PO PACK
17.0000 g | PACK | Freq: Every day | ORAL | Status: DC
  Administered 2024-10-18 – 2024-10-21 (×3): 17 g via ORAL
  Filled 2024-10-18 (×3): qty 1

## 2024-10-18 MED ORDER — VORICONAZOLE 200 MG PO TABS
200.0000 mg | ORAL_TABLET | Freq: Two times a day (BID) | ORAL | Status: DC
  Administered 2024-10-18 – 2024-10-21 (×4): 200 mg via ORAL
  Filled 2024-10-18 (×6): qty 1

## 2024-10-18 MED ORDER — DEXTROSE 5 % IV SOLN: INTRAVENOUS | Status: DC

## 2024-10-18 MED ORDER — GLUCOSE 40 % PO GEL
ORAL | Status: AC
  Administered 2024-10-18: 18:00:00 31 g
  Filled 2024-10-18: qty 1

## 2024-10-18 MED ORDER — ENSURE PLUS HIGH PROTEIN PO LIQD
237.0000 mL | Freq: Two times a day (BID) | ORAL | Status: DC
  Administered 2024-10-18 – 2024-10-19 (×2): 237 mL via ORAL

## 2024-10-18 MED ORDER — SODIUM CHLORIDE 0.9 % IV SOLN: INTRAVENOUS | Status: DC

## 2024-10-18 NOTE — Plan of Care (Signed)

## 2024-10-18 NOTE — Plan of Care (Signed)
 Problem: Education: Goal: Knowledge of General Education information will improve Description: Including pain rating scale, medication(s)/side effects and non-pharmacologic comfort measures 10/18/2024 1911 by Merilee Izetta SAUNDERS, LPN Outcome: Progressing 10/18/2024 1911 by Merilee Izetta SAUNDERS, LPN Outcome: Progressing   Problem: Health Behavior/Discharge Planning: Goal: Ability to manage health-related needs will improve 10/18/2024 1911 by Merilee Izetta SAUNDERS, LPN Outcome: Progressing 10/18/2024 1911 by Merilee, Brunella Wileman R, LPN Outcome: Progressing   Problem: Clinical Measurements: Goal: Ability to maintain clinical measurements within normal limits will improve 10/18/2024 1911 by Merilee, Sharley Keeler R, LPN Outcome: Progressing 10/18/2024 1911 by Merilee, Kitt Ledet R, LPN Outcome: Progressing Goal: Will remain free from infection 10/18/2024 1911 by Merilee, Corinn Stoltzfus R, LPN Outcome: Progressing 10/18/2024 1911 by Merilee, Ger Ringenberg R, LPN Outcome: Progressing Goal: Diagnostic test results will improve 10/18/2024 1911 by Merilee, Damonique Brunelle R, LPN Outcome: Progressing 10/18/2024 1911 by Merilee, Therin Vetsch R, LPN Outcome: Progressing Goal: Respiratory complications will improve 10/18/2024 1911 by Merilee Izetta SAUNDERS, LPN Outcome: Progressing 10/18/2024 1911 by Merilee, Jin Capote R, LPN Outcome: Progressing Goal: Cardiovascular complication will be avoided 10/18/2024 1911 by Merilee Izetta SAUNDERS, LPN Outcome: Progressing 10/18/2024 1911 by Merilee Izetta SAUNDERS, LPN Outcome: Progressing   Problem: Activity: Goal: Risk for activity intolerance will decrease 10/18/2024 1911 by Merilee Izetta SAUNDERS, LPN Outcome: Progressing 10/18/2024 1911 by Merilee Izetta SAUNDERS, LPN Outcome: Progressing   Problem: Nutrition: Goal: Adequate nutrition will be maintained 10/18/2024 1911 by Merilee Izetta SAUNDERS, LPN Outcome: Progressing 10/18/2024 1911 by Merilee, Vonne Mcdanel R, LPN Outcome: Progressing   Problem: Coping: Goal: Level  of anxiety will decrease 10/18/2024 1911 by Merilee Izetta SAUNDERS, LPN Outcome: Progressing 10/18/2024 1911 by Merilee, Bexlee Bergdoll R, LPN Outcome: Progressing   Problem: Elimination: Goal: Will not experience complications related to bowel motility 10/18/2024 1911 by Merilee Izetta SAUNDERS, LPN Outcome: Progressing 10/18/2024 1911 by Merilee, Malaisha Silliman R, LPN Outcome: Progressing Goal: Will not experience complications related to urinary retention 10/18/2024 1911 by Merilee, Mortimer Bair R, LPN Outcome: Progressing 10/18/2024 1911 by Merilee, Ahmari Garton R, LPN Outcome: Progressing   Problem: Pain Managment: Goal: General experience of comfort will improve and/or be controlled 10/18/2024 1911 by Merilee, Zianne Schubring R, LPN Outcome: Progressing 10/18/2024 1911 by Merilee, Lezette Kitts R, LPN Outcome: Progressing   Problem: Safety: Goal: Ability to remain free from injury will improve 10/18/2024 1911 by Merilee, Jarrick Fjeld R, LPN Outcome: Progressing 10/18/2024 1911 by Merilee, Anthon Harpole R, LPN Outcome: Progressing   Problem: Skin Integrity: Goal: Risk for impaired skin integrity will decrease 10/18/2024 1911 by Merilee Izetta SAUNDERS, LPN Outcome: Progressing 10/18/2024 1911 by Merilee, Sheanna Dail R, LPN Outcome: Progressing   Problem: Education: Goal: Ability to describe self-care measures that may prevent or decrease complications (Diabetes Survival Skills Education) will improve 10/18/2024 1911 by Merilee, Sherilynn Dieu R, LPN Outcome: Progressing 10/18/2024 1911 by Merilee Izetta SAUNDERS, LPN Outcome: Progressing Goal: Individualized Educational Video(s) 10/18/2024 1911 by Merilee, Doaa Kendzierski R, LPN Outcome: Progressing 10/18/2024 1911 by Merilee, Cobi Aldape R, LPN Outcome: Progressing   Problem: Coping: Goal: Ability to adjust to condition or change in health will improve 10/18/2024 1911 by Merilee, Rihaan Barrack R, LPN Outcome: Progressing 10/18/2024 1911 by Merilee, Kobie Matkins R, LPN Outcome: Progressing   Problem: Fluid Volume: Goal:  Ability to maintain a balanced intake and output will improve 10/18/2024 1911 by Merilee Izetta SAUNDERS, LPN Outcome: Progressing 10/18/2024 1911 by Merilee Izetta SAUNDERS, LPN Outcome: Progressing   Problem: Health Behavior/Discharge Planning: Goal: Ability to identify and utilize available resources and services will improve 10/18/2024 1911 by Merilee, Nellie Pester R, LPN Outcome: Progressing 10/18/2024 1911 by  Merilee, Sheila Ocasio R, LPN Outcome: Progressing Goal: Ability to manage health-related needs will improve 10/18/2024 1911 by Merilee Izetta SAUNDERS, LPN Outcome: Progressing 10/18/2024 1911 by Merilee, Gram Siedlecki R, LPN Outcome: Progressing   Problem: Metabolic: Goal: Ability to maintain appropriate glucose levels will improve 10/18/2024 1911 by Merilee Izetta SAUNDERS, LPN Outcome: Progressing 10/18/2024 1911 by Merilee, Narcisa Ganesh R, LPN Outcome: Progressing   Problem: Nutritional: Goal: Maintenance of adequate nutrition will improve 10/18/2024 1911 by Merilee Izetta SAUNDERS, LPN Outcome: Progressing 10/18/2024 1911 by Merilee, Veleria Barnhardt R, LPN Outcome: Progressing Goal: Progress toward achieving an optimal weight will improve 10/18/2024 1911 by Merilee Izetta SAUNDERS, LPN Outcome: Progressing 10/18/2024 1911 by Merilee, Eden Rho R, LPN Outcome: Progressing   Problem: Skin Integrity: Goal: Risk for impaired skin integrity will decrease 10/18/2024 1911 by Merilee Izetta SAUNDERS, LPN Outcome: Progressing 10/18/2024 1911 by Merilee, Dyamon Sosinski R, LPN Outcome: Progressing   Problem: Tissue Perfusion: Goal: Adequacy of tissue perfusion will improve 10/18/2024 1911 by Merilee Izetta SAUNDERS, LPN Outcome: Progressing 10/18/2024 1911 by Merilee Izetta SAUNDERS, LPN Outcome: Progressing

## 2024-10-18 NOTE — TOC Progression Note (Signed)
 Transition of Care [facility]) - Progression Note    Patient Details  Name: Michele House MRN: [id number] Date of Birth: 22-Dec-1955  [facility]) CM/SW Contact  Dalia GORMAN Fuse, RN Phone Number: 10/18/2024, 9:41 AM  Clinical Narrative:     Calcitonin started yesterday for hypercalcemia. Ca is now 10. Requiring high flow 02 and BiPAP at night. Referral sent to [facility] for Bipap. Will continue to assess the need for O2.                    Expected Discharge Plan and Services                                               Social Drivers of Health (SDOH) Interventions SDOH Screenings   Food Insecurity: Patient Unable To Answer (10/16/2024)  Housing: Patient Unable To Answer (10/16/2024)  Transportation Needs: Patient Unable To Answer (10/16/2024)  Utilities: Patient Unable To Answer (10/16/2024)  Depression (PHQ2-9): Low Risk (08/11/2024)  Financial Resource Strain: Low Risk  (10/05/2024)   Received from [facility]  Social Connections: Patient Unable To Answer (10/16/2024)  Recent Concern: Social Connections - Socially Isolated (09/01/2024)  Tobacco Use: Medium Risk (10/15/2024)    Readmission Risk Interventions    10/18/2024    9:39 AM  Readmission Risk Prevention Plan  Transportation Screening Complete  Medication Review (RN Care Manager) Complete  PCP or Specialist appointment within 3-5 days of discharge Complete  SW Recovery Care/Counseling Consult Complete  Skilled Nursing Facility Not Applicable

## 2024-10-18 NOTE — Progress Notes (Signed)
 Progress Note   Patient: Michele House FMW:[id number] DOB: 23-Dec-1955 DOA: 10/15/2024     2 DOS: the patient was seen and examined on 10/18/2024   Brief hospital course: EMBERLI BALLESTER is a 68 y.o. female with medical history significant for GERD, COPD, type 2 diabetes mellitus, pretension, dyslipidemia, hypothyroidism and CVA, who presented to the emergency room with acute onset of altered mental status which has been worsening over the last 3 to 4 days with increased confusion and decreased responsiveness  Based on conversation with son and daughter, patient has been bedbound, living in long-term care facility, she has a baseline confusion.  She was on 3 L oxygen chronically, but does not wear a BiPAP or CPAP. Lab results showed hypercalcemia, also has severe hypoxia and hypercapnia on ABG. Metabolic encephalopathy is due to combined hypercalcemia and hypercapnia.   Assessment and Plan:  Acute metabolic encephalopathy. This appears to be secondary to hypercalcemia as well as respiratory failure with  hypoxemia and hypercapnia. Patient most likely has untreated obstructive sleep apnea.  She also had significant hypercalcemia due to small cell lung cancer on admission. Patiently placed on high flow oxygen to maintain oxygen saturation, she is also on BiPAP nightly. Improved mental status, patient appears to be back to baseline and is oriented to person not to place or time Calcium  levels have normalized      Acute on chronic respiratory failure with hypoxemia and hypercapnia. Severe COPD. Likely obstructive sleep apnea. Patient does not seem to have COPD exacerbation, but she has severe hypoxemia and hypercapnia on ABG Patient initially required high flow oxygen to maintain oxygen saturation and has been on BiPAP nightly. She is down to 4 L of oxygen and maintaining pulse oximetry of 92%     History of stroke with right hemiparesis. Failure to thrive. Bedbound status. Patient  condition has been deteriorating over the last year, she has been bedbound since her last stroke. She does not have any potential for rehab.  Long-term prognosis is very poor. Patient has been seen by palliative care. Continue atorvastatin  and aspirin     Small cell lung cancer. Hypercalcemia secondary to lung cancer. Patient received calcitonin with improvement in her serum calcium  levels      Uncontrolled type 2 diabetes with hyperglycemia on chronic insulin  use. Improved glycemic control Continue Lantus , scheduled NovoLog  and sliding scale coverage    Overweight. BMI 27 Complicates overall prognosis and care     Seizure disorder Continue Keppra     Depression Continue BuSpar , sertraline  and trazodone    Hypertension Continue metoprolol    Hypothyroidism Continue Synthroid       Subjective: Feels better.  Oriented to person  Physical Exam: Vitals:   10/18/24 0007 10/18/24 0452 10/18/24 0722 10/18/24 1128  BP: 111/62 (!) 117/59  116/60  Pulse: 88 90  84  Resp: 16 16  (!) 21  Temp: 98.3 F (36.8 C) 97.7 F (36.5 C)  99 F (37.2 C)  TempSrc:    Oral  SpO2: 96% 100% 92% 96%  Weight:      Height:      General exam: Appears calm and comfortable  Respiratory system: Decreased breath sounds. Respiratory effort normal. Cardiovascular system: S1 & S2 heard, RRR. No JVD, murmurs, rubs, gallops or clicks. No pedal edema. Gastrointestinal system: Abdomen is nondistended, soft and nontender. No organomegaly or masses felt. Normal bowel sounds heard. Central nervous system: Alert and oriented x2.  Right-sided hemiparesis from a prior stroke Extremities: Symmetric 5 x 5 power. Skin:  No rashes, lesions or ulcers Psychiatry:  Mood & affect appropriate.      Data Reviewed: Labs reviewed.  Calcium  10.0, magnesium  1.9 Labs reviewed  Family Communication:   Disposition: Status is: Inpatient Remains inpatient appropriate because: Discharge planning  Planned  Discharge Destination: Skilled nursing facility    Time spent: 50 minutes  Author: Aimee Somerset, MD 10/18/2024 12:10 PM  For on call review [email or web address].

## 2024-10-18 NOTE — Care Management Important Message (Signed)
 Important Message  Patient Details  Name: Michele House MRN: [id number] Date of Birth: 1956/03/06   Important Message Given:  Yes - Medicare IM     Roper Tolson W, CMA 10/18/2024, 3:53 PM

## 2024-10-18 NOTE — Progress Notes (Signed)
 Speech Language Pathology Treatment: Dysphagia  Patient Details Name: Michele House MRN: [id number] DOB: 1956-03-20 Today's Date: 10/18/2024 Time: 9144-9064 SLP Time Calculation (min) (ACUTE ONLY): 40 min  Assessment / Plan / Recommendation Clinical Impression  Pt seen for ongoing assessment of swallowing and toleration of diet this morning. Pt was awake, verbal and followed instructions w/ cues. Mild confusion noted in some responses; unsure of her Baseline Cognitive function. Pt is bedbound in a LTC facility. She is Missing Dentition and needs support at meals for feeding- she was able to hold Cup to drink which improves safety of swallowing.   Pt appears to present w/ grossly functional oropharyngeal phase swallowing w/ No overt oropharyngeal phase dysphagia noted in setting of Missing Dentition for effective mastication of solids. No neuromuscular deficits noted.  Pt consumed po trials w/ No overt, clinical s/s of aspiration during po trials. Pt appears at reduced risk for aspiration when following general aspiration precautions and using a slightly modified (foods) diet for ease of chewing. She also requires support w/ feeding at meals- this can increased risk for dysphagia and decreased oral intake overall.   During po trials of breakfast meal, pt consumed all consistencies w/ no overt coughing, decline in vocal quality, or change in respiratory presentation during/post trials. Oral phase appeared grossly [facility] w/ timely bolus management, mastication, and control of bolus propulsion for A-P transfer for swallowing. Oral clearing achieved w/ all trial consistencies -- moistened, soft, cut foods given.  Pt helped to Hold Cup to drink.   Recommend a Mech Soft/Regular consistency diet w/ well-Cut meats, moistened foods; Thin liquids -- pt should help to Hold Cup when drinking. Recommend general aspiration precautions including small bites/sips slowly and clearing mouth b/t bites using  alternating of food and liquids as a strategy if needed. Sit upright for all oral intake. Eat/drink Slowly. Feeding Support at meals. Pills WHOLE in Puree for safer, easier swallowing.  Education given on Pills in Puree; food consistencies and easy to eat options; general aspiration precautions to pt. NSG updated. MD to reconsult if any new needs arise. MD updated, agreed. Recommend Dietician f/u for support. Precautions posted in room, chart.        HPI HPI: Michele House is a 68 y.o. female with medical history significant for GERD, COPD, type 2 diabetes mellitus, pretension, dyslipidemia, hypothyroidism and CVA, who presented to the emergency room with acute onset of altered mental status which has been worsening over the last 3 to 4 days with increased confusion and decreased responsiveness.  She has baseline weakness of both lower extremities in the right upper extremity from previous CVA.  She is usually verbal in the last few days has not been communicating much.  She was admitted here recently from 11/16 until 09/22/2024 for management of sepsis due to pneumonia with atelectasis of the left lower lobe with associated non-small cell lung cancer, COPD exacerbation and acute on chronic respiratory failure with hypoxia and hypercarbia, hyperkalemia and was discharged to her SNF.  She underwent a bronchoscopy with removal of heavy amounts of mucous plugging with improvement of her oxygenation and BAL specimens later grew Aspergillus fumigatus and ESBL E. coli sensitive to meropenem and to less extent Unasyn .  She has apparently been stable since discharge until her last 4 days.  No reported fever or chills.  No reported nausea or vomiting or abdominal pain.  No dysuria, oliguria or hematuria or flank pain or reported.  The patient was fairly somnolent and therefore  not on her historian. CT Head: No acute intracranial abnormality. CXR 10/15/24 (comparison to last admission): Similar right upper lung  mass and bibasilar opacities.  2. Similar diffuse interstitial prominence.  3. Small right pleural effusion. Per MD note: pt likely has untreated obstructive sleep apnea.  She also had significant hypercalcemia due to small cell lung cancer. These issues could impact cognitive-communication abilities.       SLP Plan  All goals met        Swallow Evaluation Recommendations   Recommendations: PO diet PO Diet Recommendation: Dysphagia 3 (Mechanical soft);Thin liquids (Level 0) (for ease of mastication of solid d/t missing dentition) Liquid Administration via: Cup;Straw (monitor and support) Medication Administration: Whole meds with puree (vs Crushed in Puree if need indicates) Supervision: Staff to assist with self-feeding;Full supervision/cueing for swallowing strategies;Set-up assistance for safety Postural changes: Position pt fully upright for meals;Stay upright 30-60 min after meals;Out of bed for meals Oral care recommendations: Oral care BID (2x/day);Staff/trained caregiver to provide oral care Recommended consults: Consider dietitian consultation;Consider Palliative care     Recommendations   See above                  Oral care BID;Staff/trained caregiver to provide oral care   Frequent or constant Supervision/Assistance (at meals) Dysphagia, unspecified (R13.10) (related to acute on chronic pulmonary status, mentation, deconditioning)     All goals met         Comer Portugal, MS, CCC-SLP Speech Language Pathologist Rehab Services; [facility] [place] [phone] (ascom) Dodi Leu  10/18/2024, 3:28 PM

## 2024-10-18 NOTE — Progress Notes (Addendum)
 Daily Progress Note   Patient Name: Michele House       Date: 10/18/2024 DOB: 24-May-1956  Age: 68 y.o. MRN#: [id number] Attending Physician: Lanetta Lingo, MD Primary Care Physician: [facility] Admit Date: 10/15/2024  Reason for Consultation/Follow-up: Establishing goals of care  Subjective: Notes and labs reviewed.  In to see patient.  She is currently sitting in bed at this time, no family at bedside.  She appears improved from my visit yesterday; no distress noted.  She is able to tell me her name, that we are at the hospital, the year is 2025, and that she has shortness of breath and severe COPD.  She discusses that her daughter Michele House would like for her to go to hospice.  She states her son Michele House does not.  She states she does not want to put her children through having to go see her at hospice.  We discussed a potential option of going home with hospice to follow there.  We discussed continued aggressive care and multiple scenarios.  We discussed a shift to comfort focused care and multiple scenarios there as well.  We discussed acceptable quality of life.  She states she understands her status and prognosis, and would like to talk to her children about an option of hospice at home.  Offered a family meeting, or to call her daughter or son, and she is clear she would like to speak with her children herself, privately.  Discussed CODE STATUS, and she states at this time she would like to leave everything as it is until after she has spoken with them.  Discussed that I would follow-up tomorrow for further conversation.  Length of Stay: 2  Current Medications: Scheduled Meds:   (feeding supplement) PROSource Plus  30 mL Oral BID BM   aspirin  EC  81 mg Oral Daily    atorvastatin   20 mg Oral Daily   busPIRone   20 mg Oral BID   Chlorhexidine  Gluconate Cloth  6 each Topical Daily   collagenase    Topical Daily   enoxaparin  (LOVENOX ) injection  40 mg Subcutaneous Q24H   feeding supplement  237 mL Oral BID BM   ferrous sulfate   325 mg Oral Q breakfast   insulin  aspart  0-5 Units Subcutaneous QHS   insulin  aspart  0-9 Units Subcutaneous TID  WC   insulin  aspart  4 Units Subcutaneous TID WC   insulin  glargine  18 Units Subcutaneous Daily   ipratropium-albuterol   3 mL Nebulization BID   levETIRAcetam   500 mg Oral BID   levothyroxine   125 mcg Oral Q0600   linagliptin   5 mg Oral Daily   loratadine   10 mg Oral Daily   metoprolol  succinate  50 mg Oral Daily   multivitamin with minerals  1 tablet Oral Daily   nutrition supplement (JUVEN)  1 packet Oral BID BM   pantoprazole   40 mg Oral BID AC   pregabalin   50 mg Oral BID   rOPINIRole   4 mg Oral QHS   senna  1 tablet Oral BID   sertraline   100 mg Oral Daily    Continuous Infusions:   PRN Meds: acetaminophen  **OR** acetaminophen , alum & mag hydroxide-simeth, ondansetron  **OR** ondansetron  (ZOFRAN ) IV, oxyCODONE -acetaminophen , traZODone   Physical Exam Pulmonary:     Effort: Pulmonary effort is normal.  Skin:    General: Skin is warm and dry.  Neurological:     Mental Status: She is alert.             Vital Signs: BP 116/60 (BP Location: Left Leg)   Pulse 84   Temp 99 F (37.2 C) (Oral)   Resp (!) 21   Ht 5' 1 (1.549 m)   Wt 66.7 kg   LMP 12/24/1998 Comment: prior to her hysterectomy  SpO2 96%   BMI 27.78 kg/m  SpO2: SpO2: 96 % O2 Device: O2 Device: Nasal Cannula O2 Flow Rate: O2 Flow Rate (L/min): 4 L/min  Intake/output summary:  Intake/Output Summary (Last 24 hours) at 10/18/2024 1141 Last data filed at 10/18/2024 0900 Gross per 24 hour  Intake 0 ml  Output --  Net 0 ml   LBM: Last BM Date : 10/16/24 Baseline Weight: Weight: 66.7 kg Most recent weight: Weight: 66.7  kg   Patient Active Problem List   Diagnosis Date Noted   Elevated troponin 10/17/2024   Anxiety and depression 10/16/2024   Type 2 diabetes mellitus with peripheral angiopathy (HCC) 10/16/2024   Hypercalcemia 10/16/2024   Acute encephalopathy 10/15/2024   Nephrolithiasis 09/18/2024   Sepsis due to pneumonia (HCC) 09/17/2024   COPD with acute exacerbation (HCC) 09/17/2024   Acute on chronic respiratory failure with hypoxia and hypercapnia (HCC) 09/17/2024   Seizure disorder (HCC) 09/17/2024   Dyslipidemia 09/17/2024   HFrEF (heart failure with reduced ejection fraction) (HCC) 09/17/2024   Type 2 diabetes mellitus with peripheral neuropathy (HCC) 09/17/2024   Acute respiratory failure with hypoxia and hypercarbia (HCC) 09/02/2024   Pressure injury of skin 09/02/2024   Encounter for antineoplastic chemotherapy 02/04/2024   IDA (iron  deficiency anemia) 01/07/2024   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) 12/17/2023   Anemia 12/17/2023   Acute deep vein thrombosis (DVT) of right lower extremity (HCC) 11/09/2023   Bilateral leg pain 11/09/2023   Melena 10/28/2023   Gastrointestinal hemorrhage with melena 10/28/2023   Duodenal ulcer 10/28/2023   CVA (cerebral vascular accident) (HCC) 10/14/2023   Hypotension 10/11/2023   Hypernatremia 10/11/2023   Uncontrolled type 2 diabetes mellitus with hypoglycemia, without long-term current use of insulin  (HCC) 10/08/2023   Acute metabolic encephalopathy 10/06/2023   Altered mental status 10/02/2023   UTI (urinary tract infection) 10/02/2023   (HFpEF) heart failure with preserved ejection fraction (HCC) 10/02/2023   Pneumonia 09/24/2023   COPD (chronic obstructive pulmonary disease) (HCC) 09/24/2023   Acute CHF (congestive heart failure) (  HCC) 09/23/2023   COPD exacerbation (HCC) 06/07/2023   Acute on chronic respiratory failure with hypoxia (HCC) 06/07/2023   Allergic reaction 06/07/2023   Disequilibrium 07/16/2015   Pain in right  shoulder 05/28/2015   Dysphagia 05/08/2015   Acute sinusitis 10/02/2014   Other specified anxiety disorders 09/19/2014   Low back pain 07/03/2014   Shoulder joint pain 05/01/2014   Other malaise 02/01/2014   Chest discomfort 10/12/2013   Tobacco use disorder 10/12/2013   Essential hypertension 10/12/2013   Obesity 10/12/2013   Diabetes mellitus (HCC) 10/12/2013   Allergic rhinitis 03/23/2012   Trigger finger 03/23/2012   Vitamin D  deficiency 03/23/2012   Pure hypercholesterolemia 09/05/2009   Hypothyroidism 12/24/2006   Osteoarthritis 12/24/2006   Scoliosis 12/24/2006    Palliative Care Assessment & Plan    Recommendations/Plan: Patient states she would like to speak with her children privately regarding an option of hospice at home.  She would like to continue full code and full scope until after these conversations.  Discussed that I would follow-up tomorrow to follow-up.  Code Status:    Code Status Orders  (From admission, onward)           Start     Ordered   10/15/24 2310  Full code  Continuous       Question:  By:  Answer:  Consent: discussion documented in EHR   10/15/24 2310           Code Status History     Date Active Date Inactive Code Status Order ID Comments User Context   09/17/2024 2337 09/22/2024 2154 Full Code [id number]  Lawence Madison LABOR, MD ED   09/01/2024 1100 09/05/2024 0241 Full Code [id number]  Laurita Cort DASEN, MD ED   01/18/2024 1124 01/19/2024 0518 Full Code [id number]  Luverne Aran, MD HOV   10/31/2023 1455 12/03/2023 0010 Do not attempt resuscitation (DNR) PRE-ARREST INTERVENTIONS DESIRED [id number]  Arloa Waddell RAMAN, NP Inpatient   10/02/2023 0738 10/31/2023 1455 Full Code [id number]  Eldonna Elspeth PARAS, MD ED   09/23/2023 2025 09/26/2023 2320 Full Code [id number]  Cleatus Delayne GAILS, MD ED   06/07/2023 1758 06/08/2023 2020 Full Code [id number]  Eldonna Elspeth PARAS, MD ED     Camelia Lewis, NP  Please contact Palliative Medicine Team phone at  [phone] for questions and concerns.

## 2024-10-18 NOTE — Progress Notes (Signed)
 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  It had not been resumed since admit and discussed with Dr Epifanio and he felt ok to resume  Also discussed with current attending provider.     Maie Kesinger, PharmD, BCPS, BCIDP Work Cell: [phone] 10/18/2024 3:19 PM

## 2024-10-19 ENCOUNTER — Inpatient Hospital Stay

## 2024-10-19 ENCOUNTER — Other Ambulatory Visit: Payer: Self-pay

## 2024-10-19 DIAGNOSIS — G9340 Encephalopathy, unspecified: Secondary | ICD-10-CM | POA: Diagnosis not present

## 2024-10-19 DIAGNOSIS — Z789 Other specified health status: Secondary | ICD-10-CM

## 2024-10-19 LAB — BLOOD GAS, ARTERIAL
Acid-Base Excess: 11 mmol/L — ABNORMAL HIGH (ref 0.0–2.0)
Bicarbonate: 39.7 mmol/L — ABNORMAL HIGH (ref 20.0–28.0)
O2 Content: 9 L/min
O2 Saturation: 97.5 %
Patient temperature: 37
pCO2 arterial: 72 mmHg (ref 32–48)
pH, Arterial: 7.35 (ref 7.35–7.45)
pO2, Arterial: 94 mmHg (ref 83–108)

## 2024-10-19 LAB — BASIC METABOLIC PANEL WITH GFR
Anion gap: 8 (ref 5–15)
BUN: 8 mg/dL (ref 8–23)
CO2: 33 mmol/L — ABNORMAL HIGH (ref 22–32)
Calcium: 10.9 mg/dL — ABNORMAL HIGH (ref 8.9–10.3)
Chloride: 103 mmol/L (ref 98–111)
Creatinine, Ser: 0.39 mg/dL — ABNORMAL LOW (ref 0.44–1.00)
GFR, Estimated: >60 mL/min (ref >60)
Glucose, Bld: 172 mg/dL — ABNORMAL HIGH (ref 70–99)
Potassium: 4.1 mmol/L (ref 3.5–5.1)
Sodium: 144 mmol/L (ref 135–145)

## 2024-10-19 LAB — CBC
HCT: 35.8 % — ABNORMAL LOW (ref 36.0–46.0)
Hemoglobin: 11 g/dL — ABNORMAL LOW (ref 12.0–15.0)
MCH: 26 pg (ref 26.0–34.0)
MCHC: 30.7 g/dL (ref 30.0–36.0)
MCV: 84.6 fL (ref 80.0–100.0)
Platelets: 221 K/uL (ref 150–400)
RBC: 4.23 MIL/uL (ref 3.87–5.11)
RDW: 15.7 % — ABNORMAL HIGH (ref 11.5–15.5)
WBC: 5.2 K/uL (ref 4.0–10.5)
nRBC: 0 % (ref 0.0–0.2)

## 2024-10-19 LAB — GLUCOSE, CAPILLARY
Glucose-Capillary: 43 mg/dL — CL (ref 70–99)
Glucose-Capillary: 203 mg/dL — ABNORMAL HIGH (ref 70–99)
Glucose-Capillary: 198 mg/dL — ABNORMAL HIGH (ref 70–99)
Glucose-Capillary: 198 mg/dL — ABNORMAL HIGH (ref 70–99)
Glucose-Capillary: 178 mg/dL — ABNORMAL HIGH (ref 70–99)
Glucose-Capillary: 137 mg/dL — ABNORMAL HIGH (ref 70–99)
Glucose-Capillary: 182 mg/dL — ABNORMAL HIGH (ref 70–99)
Glucose-Capillary: 190 mg/dL — ABNORMAL HIGH (ref 70–99)

## 2024-10-19 LAB — BLOOD GAS, VENOUS
Acid-Base Excess: 14.2 mmol/L — ABNORMAL HIGH (ref 0.0–2.0)
Bicarbonate: 41 mmol/L — ABNORMAL HIGH (ref 20.0–28.0)
O2 Saturation: 69.3 %
Patient temperature: 37
pCO2, Ven: 59 mmHg (ref 44–60)
pH, Ven: 7.45 — ABNORMAL HIGH (ref 7.25–7.43)
pO2, Ven: 39 mmHg (ref 32–45)

## 2024-10-19 MED ORDER — FUROSEMIDE 10 MG/ML IJ SOLN
INTRAMUSCULAR | Status: AC
  Administered 2024-10-19: 09:00:00 40 mg
  Filled 2024-10-19: qty 4

## 2024-10-19 MED ORDER — ZOLEDRONIC ACID 4 MG/100ML IV SOLN
4.0000 mg | Freq: Once | INTRAVENOUS | Status: AC
  Administered 2024-10-19: 15:00:00 4 mg via INTRAVENOUS
  Filled 2024-10-19 (×2): qty 100

## 2024-10-19 MED ORDER — SODIUM CHLORIDE 0.9 % IV SOLN
100.0000 mg | Freq: Two times a day (BID) | INTRAVENOUS | Status: DC
  Administered 2024-10-19 – 2024-10-20 (×3): 100 mg via INTRAVENOUS
  Filled 2024-10-19 (×5): qty 100

## 2024-10-19 MED ORDER — SODIUM CHLORIDE 0.9 % IV SOLN
1.0000 g | Freq: Every day | INTRAVENOUS | Status: DC
  Administered 2024-10-19: 14:00:00 1 g via INTRAVENOUS
  Filled 2024-10-19 (×3): qty 10

## 2024-10-19 MED ORDER — DEXTROSE 10 % IV SOLN: INTRAVENOUS | Status: DC

## 2024-10-19 MED ORDER — LEVETIRACETAM (KEPPRA) 500 MG/5 ML ADULT IV PUSH
500.0000 mg | Freq: Two times a day (BID) | INTRAVENOUS | Status: DC
  Administered 2024-10-19 (×2): 500 mg via INTRAVENOUS
  Filled 2024-10-19 (×3): qty 5

## 2024-10-19 NOTE — Progress Notes (Signed)
 Daily Progress Note   Patient Name: Michele House       Date: 10/19/2024 DOB: 14-Dec-1955  Age: 68 y.o. MRN#: [id number] Attending Physician: Lanetta Lingo, MD Primary Care Physician: [facility] Admit Date: 10/15/2024  Reason for Consultation/Follow-up: Establishing goals of care  Subjective: Notes and labs reviewed.  In to see patient.  She is currently resting in bed on BiPAP with staff at bedside.  She looks very tired.  She attempts to talk to me but I am unable to understand.  She nods her head that she talk to her family last night.  She nods her head that she will be okay with transitioning to comfort care, and dying naturally with no CPR.  She nods her head that she would like for me to call Alm to discuss hospice care.  Called to speak to son Alm.  He states he was able to come last night and speak with her and told her that he was willing to abide by what ever decision that she makes and that if she wanted hospice care he was amenable to this.  He states he would like to transition to DNR and DNI, and would like to speak to a hospice liaison regarding discharge planning.  Discussed continuing BiPAP and treating the treatable as he is currently working out of town and will not be able to return to the hospital until later this evening.  Called to speak with daughter [name].  [name] states that Alm is making all healthcare decisions for their mother, but she is in agreement with hospice care.  Discussed that hospice liaison should be speaking with Alm and that I would follow-up tomorrow morning.  Discussed changing CODE STATUS to DNR/DNI.  She is in agreement with this.  I completed a MOST form today through [employer] with Alm, and it was immediately available to view  under ACP tab.  The patient's son outlined her wishes for the following treatment decisions:  Cardiopulmonary Resuscitation: Do Not Attempt Resuscitation (DNR/No CPR)  Medical Interventions: Limited Additional Interventions: Use medical treatment, IV fluids and cardiac monitoring as indicated, DO NOT USE intubation or mechanical ventilation. May consider use of less invasive airway support such as BiPAP or CPAP. Also provide comfort measures. Transfer to the hospital if indicated. Avoid intensive care.   Antibiotics:  Antibiotics if indicated  IV Fluids: IV fluids if indicated  Feeding Tube: No feeding tube     Length of Stay: 3  Current Medications: Scheduled Meds:   (feeding supplement) PROSource Plus  30 mL Oral BID BM   aspirin  EC  81 mg Oral Daily   atorvastatin   20 mg Oral Daily   busPIRone   20 mg Oral BID   Chlorhexidine  Gluconate Cloth  6 each Topical Daily   collagenase    Topical Daily   enoxaparin  (LOVENOX ) injection  40 mg Subcutaneous Q24H   feeding supplement  237 mL Oral BID BM   ferrous sulfate   325 mg Oral Q breakfast   ipratropium-albuterol   3 mL Nebulization BID   levETIRAcetam   500 mg Intravenous Q12H   levothyroxine   125 mcg Oral Q0600   linagliptin   5 mg Oral Daily   loratadine   10 mg Oral Daily   metoprolol  succinate  50 mg Oral Daily   multivitamin with minerals  1 tablet Oral Daily   nutrition supplement (JUVEN)  1 packet Oral BID BM   pantoprazole   40 mg Oral BID AC   polyethylene glycol  17 g Oral Daily   pregabalin   50 mg Oral BID   rOPINIRole   4 mg Oral QHS   senna  1 tablet Oral BID   sertraline   100 mg Oral Daily   voriconazole   200 mg Oral Q12H   zoledronic  acid (ZOMETA ) IVPB  4 mg Intravenous Once    Continuous Infusions:  cefTRIAXone  (ROCEPHIN )  IV     doxycycline  (VIBRAMYCIN ) IV      PRN Meds: acetaminophen  **OR** acetaminophen , alum & mag hydroxide-simeth, ondansetron  **OR** ondansetron  (ZOFRAN ) IV, oxyCODONE -acetaminophen ,  traZODone   Physical Exam          Vital Signs: BP 135/74 (BP Location: Right Arm)   Pulse 96   Temp 98.5 F (36.9 C)   Resp 20   Ht 5' 1 (1.549 m)   Wt 66.7 kg   LMP 12/24/1998 Comment: prior to her hysterectomy  SpO2 (!) 89%   BMI 27.78 kg/m  SpO2: SpO2: (!) 89 % O2 Device: O2 Device: Nasal Cannula O2 Flow Rate: O2 Flow Rate (L/min): 4 L/min  Intake/output summary:  Intake/Output Summary (Last 24 hours) at 10/19/2024 1219 Last data filed at 10/19/2024 1006 Gross per 24 hour  Intake 1079.96 ml  Output 600 ml  Net 479.96 ml   LBM: Last BM Date : 10/18/24 Baseline Weight: Weight: 66.7 kg Most recent weight: Weight: 66.7 kg       Palliative Assessment/Data:      Patient Active Problem List   Diagnosis Date Noted   Elevated troponin 10/17/2024   Anxiety and depression 10/16/2024   Type 2 diabetes mellitus with peripheral angiopathy (HCC) 10/16/2024   Hypercalcemia 10/16/2024   Acute encephalopathy 10/15/2024   Nephrolithiasis 09/18/2024   Sepsis due to pneumonia (HCC) 09/17/2024   COPD with acute exacerbation (HCC) 09/17/2024   Acute on chronic respiratory failure with hypoxia and hypercapnia (HCC) 09/17/2024   Seizure disorder (HCC) 09/17/2024   Dyslipidemia 09/17/2024   HFrEF (heart failure with reduced ejection fraction) (HCC) 09/17/2024   Type 2 diabetes mellitus with peripheral neuropathy (HCC) 09/17/2024   Acute respiratory failure with hypoxia and hypercarbia (HCC) 09/02/2024   Pressure injury of skin 09/02/2024   Encounter for antineoplastic chemotherapy 02/04/2024   IDA (iron  deficiency anemia) 01/07/2024   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) 12/17/2023   Anemia 12/17/2023   Acute deep vein  thrombosis (DVT) of right lower extremity (HCC) 11/09/2023   Bilateral leg pain 11/09/2023   Melena 10/28/2023   Gastrointestinal hemorrhage with melena 10/28/2023   Duodenal ulcer 10/28/2023   CVA (cerebral vascular accident) (HCC) 10/14/2023    Hypotension 10/11/2023   Hypernatremia 10/11/2023   Uncontrolled type 2 diabetes mellitus with hypoglycemia, without long-term current use of insulin  (HCC) 10/08/2023   Acute metabolic encephalopathy 10/06/2023   Altered mental status 10/02/2023   UTI (urinary tract infection) 10/02/2023   (HFpEF) heart failure with preserved ejection fraction (HCC) 10/02/2023   Pneumonia 09/24/2023   COPD (chronic obstructive pulmonary disease) (HCC) 09/24/2023   Acute CHF (congestive heart failure) (HCC) 09/23/2023   COPD exacerbation (HCC) 06/07/2023   Acute on chronic respiratory failure with hypoxia (HCC) 06/07/2023   Allergic reaction 06/07/2023   Disequilibrium 07/16/2015   Pain in right shoulder 05/28/2015   Dysphagia 05/08/2015   Acute sinusitis 10/02/2014   Other specified anxiety disorders 09/19/2014   Low back pain 07/03/2014   Shoulder joint pain 05/01/2014   Other malaise 02/01/2014   Chest discomfort 10/12/2013   Tobacco use disorder 10/12/2013   Essential hypertension 10/12/2013   Obesity 10/12/2013   Diabetes mellitus (HCC) 10/12/2013   Allergic rhinitis 03/23/2012   Trigger finger 03/23/2012   Vitamin D  deficiency 03/23/2012   Pure hypercholesterolemia 09/05/2009   Hypothyroidism 12/24/2006   Osteoarthritis 12/24/2006   Scoliosis 12/24/2006    Palliative Care Assessment & Plan    Recommendations/Plan: DNR/DNI Continue to treat the treatable at this time as son is working out of town and will not be able to come home until this evening. Son would like to speak with hospice liaison regarding care moving forward. Attending and TOC updated via epic chat   Code Status:    Code Status Orders  (From admission, onward)           Start     Ordered   10/19/24 1216  Do not attempt resuscitation (DNR)- Limited -Do Not Intubate (DNI)  (Code Status)  Continuous       Question Answer Comment  If pulseless and not breathing No CPR or chest compressions.   In Pre-Arrest  Conditions (Patient Is Breathing and Has A Pulse) Do not intubate. Provide all appropriate non-invasive medical interventions. Avoid ICU transfer unless indicated or required.   Consent: Discussion documented in EHR or advanced directives reviewed      10/19/24 1215           Code Status History     Date Active Date Inactive Code Status Order ID Comments User Context   10/15/2024 2311 10/19/2024 1215 Full Code [id number]  Lawence Madison LABOR, MD ED   09/17/2024 2337 09/22/2024 2154 Full Code [id number]  Mansy, Madison LABOR, MD ED   09/01/2024 1100 09/05/2024 0241 Full Code [id number]  Laurita Cort DASEN, MD ED   01/18/2024 1124 01/19/2024 0518 Full Code [id number]  Luverne Aran, MD HOV   10/31/2023 1455 12/03/2023 0010 Do not attempt resuscitation (DNR) PRE-ARREST INTERVENTIONS DESIRED [id number]  Arloa Waddell RAMAN, NP Inpatient   10/02/2023 0738 10/31/2023 1455 Full Code [id number]  Eldonna Elspeth PARAS, MD ED   09/23/2023 2025 09/26/2023 2320 Full Code [id number]  Cleatus Delayne GAILS, MD ED   06/07/2023 1758 06/08/2023 2020 Full Code [id number]  Eldonna Elspeth PARAS, MD ED      Camelia Lewis, NP  Please contact Palliative Medicine Team phone at [phone] for questions and concerns.

## 2024-10-19 NOTE — Progress Notes (Addendum)
 [facility] Liaison Note  ICM, Dalia Fuse, RN, asked Hospital Liaison to speak with patient's son about the IPU and hospice services at a LTC facility.    Voicemail  message left with son, asking that he call HL back to discuss. Awaiting a return phone call.  Please call with any hospice related questions or concerns.  Thank you for the opportunity to participate in this patient's care  [facility] Liaison 336 [phone]

## 2024-10-19 NOTE — TOC Progression Note (Addendum)
 Transition of Care [facility]) - Progression Note    Patient Details  Name: Michele House MRN: [id number] Date of Birth: [birth year]  [facility] of [facility] CM/SW Contact  Dalia GORMAN Fuse, RN Phone Number: 10/19/2024, 11:51 AM  Clinical Narrative:    Hypoxic this AM with O2 in the 60's Requiring 6 L [place].  ABG shows hypercapnia, placed on BiPAP.  TOC received message from [facility]. The patient is now DNR and long-term prognosis is poor. The patients family has questions about Hospice IPU vs Hospice at [place]. [place] with [facility] to complete eval.  13:00 TOC received message from [place] with [facility]. They are attempting to reach the patient's son.                      Expected Discharge Plan and Services                                               Social Drivers of Health (SDOH) Interventions SDOH Screenings   Food Insecurity: Patient Unable To Answer (10/16/2024)  Housing: Unknown (10/18/2024)  Transportation Needs: Patient Unable To Answer (10/16/2024)  Utilities: Patient Unable To Answer (10/16/2024)  Depression (PHQ2-9): Low Risk (08/11/2024)  Financial Resource Strain: Low Risk  (10/05/2024)   Received from [facility]  Social Connections: Patient Unable To Answer (10/16/2024)  Recent Concern: Social Connections - Socially Isolated (09/01/2024)  Tobacco Use: Medium Risk (10/15/2024)    Readmission Risk Interventions    10/18/2024    9:39 AM  Readmission Risk Prevention Plan  Transportation Screening Complete  Medication Review (RN Care Manager) Complete  PCP or Specialist appointment within 3-5 days of discharge Complete  SW Recovery Care/Counseling Consult Complete  Skilled Nursing Facility Not Applicable

## 2024-10-19 NOTE — Progress Notes (Signed)
 Progress Note   Patient: Michele House FMW:[id number] DOB: 03-20-1956 DOA: 10/15/2024     3 DOS: the patient was seen and examined on 10/19/2024   Brief hospital course: Michele House is a 68 y.o. female with medical history significant for GERD, COPD, type 2 diabetes mellitus, pretension, dyslipidemia, hypothyroidism and CVA, who presented to the emergency room with acute onset of altered mental status which has been worsening over the last 3 to 4 days with increased confusion and decreased responsiveness  Based on conversation with son and daughter, patient has been bedbound, living in long-term care facility, she has a baseline confusion.  She was on 3 L oxygen chronically, but does not wear a BiPAP or CPAP. Lab results showed hypercalcemia, also has severe hypoxia and hypercapnia on ABG. Metabolic encephalopathy is due to combined hypercalcemia and hypercapnia.  12/18 -noted to have episodes of hypoglycemia overnight requiring D5 infusion.  Lethargic this morning and per RN patient was hypoxic this morning with pulse oximetry in the 60s on room air.  She increased patient's oxygen supplementation to 6 L to improve her pulse oximetry.  Arterial blood gas shows compensated hypercapnia.  BiPAP requested.  Supposed to be on BiPAP at night   Assessment and Plan:   Acute metabolic encephalopathy. This appears to be secondary to hypercalcemia as well as respiratory failure with  hypoxemia and hypercapnia. Patient most likely has untreated obstructive sleep apnea.  She also had significant hypercalcemia due to small cell lung cancer on admission persists but shows a downward trend. Patiently placed on high flow oxygen to maintain oxygen saturation, she is also on BiPAP nightly. Improved mental status, patient appears to be back to baseline and is oriented to person not to place or time Calcium  levels have normalized      Acute on chronic respiratory failure with hypoxemia and  hypercapnia. Severe COPD. Likely obstructive sleep apnea. Patient does not seem to have COPD exacerbation, but she has severe hypoxemia and hypercapnia on ABG Patient initially required high flow oxygen to maintain oxygen saturation and supposed to be has on BiPAP nightly. Will place on BiPAP due to hypercapnia and lethargy     History of stroke with right hemiparesis. Failure to thrive. Bedbound status. Patient condition has been deteriorating over the last year, she has been bedbound since her last stroke. She does not have any potential for rehab.  Long-term prognosis is very poor. Patient has been seen by palliative care. Continue atorvastatin  and aspirin      Small cell lung cancer. Hypercalcemia secondary to lung cancer. Corrected calcium  is 11 Will give a dose of pamidronate Repeat calcium  levels in a.m.       Uncontrolled type 2 diabetes with hyperglycemia on chronic insulin  use. Hypoglycemia Patient's blood sugar dropped in the 40s requiring D5W infusion Blood sugars have improved Hold insulin  Check blood sugars every 4 hours     Overweight. BMI 27 Complicates overall prognosis and care     Seizure disorder Continue Keppra      Depression Continue BuSpar , sertraline  and trazodone      Hypertension Continue metoprolol      Hypothyroidism Continue Synthroid            Subjective: Lethargic but arouses easily  Physical Exam: Vitals:   10/19/24 0000 10/19/24 0007 10/19/24 0410 10/19/24 0742  BP: 139/76  135/75 135/74  Pulse: (!) 106 99 93 96  Resp: 20  18 20   Temp: 99.1 F (37.3 C)  98.4 F (36.9 C) 98.5 F (36.9  C)  TempSrc: Oral  Oral   SpO2: (!) 74% 90% 90% (!) 89%  Weight:      Height:       General exam: Lethargic but arouses easily Respiratory system: Decreased breath sounds. Respiratory effort normal. Cardiovascular system: S1 & S2 heard, RRR. No JVD, murmurs, rubs, gallops or clicks. No pedal edema. Gastrointestinal system: Abdomen  is nondistended, soft and nontender. No organomegaly or masses felt. Normal bowel sounds heard. Central nervous system: .  Right-sided hemiparesis from a prior stroke Extremities: Right-sided weakness Skin: No rashes, lesions or ulcers Psychiatry:  Mood & affect appropriate.    Data Reviewed: Calcium  10.9 Labs reviewed  Family Communication: Called and discussed plan of care with patient's son over the phone.  All questions and concerns have been addressed.  He verbalizes understanding and agrees with the plan  Disposition: Status is: Inpatient Remains inpatient appropriate because: Requires BiPAP for hypercapnia  Planned Discharge Destination: TBD    Time spent: 60 minutes  Author: Aimee Somerset, MD 10/19/2024 11:33 AM  For on call review [email or web address].

## 2024-10-19 NOTE — Inpatient Diabetes Management (Signed)
°  Inpatient Diabetes Program Recommendations  AACE/ADA: New Consensus Statement on Inpatient Glycemic Control  Target Ranges:  Prepandial:   less than 140 mg/dL      [facility] postprandial:   less than 180 mg/dL (1-2 hours)      Critically ill patients:  140 - 180 mg/dL    Latest Reference Range & Units 10/18/24 07:46 10/18/24 11:30 10/18/24 17:08 10/18/24 17:56 10/18/24 20:52 10/18/24 23:57 10/19/24 02:47 10/19/24 04:12  Glucose-Capillary 70 - 99 mg/dL 872 (H) 843 (H) 43 (LL) 65 (L) 151 (H) 213 (H) 203 (H) 198 (H)   Review of Glycemic Control  Diabetes history: DM2 Outpatient Diabetes medications: Semglee  18 units daily, Novolog  4 units TID with meals, Glipizide  5 mg QAM, Tradjenta  5 mg daily Current orders for Inpatient glycemic control: Tradjenta  5 mg daily  Inpatient Diabetes Program Recommendations:    Insulin : Patient received Lantus  18 units, Novolog  meal coverage, Novolog  correction and Tradjenta  on 10/18/24 and CBG down to 43 mg/dl at 82:91 on 87/82/74. Noted all insulin  was discontinued.  CBG 198 mg/dl this morning. Please consider ordering Lantus  10 units daily and Novolog  0-9 units TID with meals and Novolog  0-5 units at bedtime.  Thanks, Earnie Gainer, RN, MSN, CDCES Diabetes Coordinator Inpatient Diabetes Program [phone] (Team Pager from 8am to 5pm)

## 2024-10-19 NOTE — Progress Notes (Signed)
 Report called and given to nurse corresponding to 2A room 256. Waiting for room to be cleaned

## 2024-10-20 LAB — BLOOD GAS, ARTERIAL
Acid-Base Excess: 13.7 mmol/L — ABNORMAL HIGH (ref 0.0–2.0)
Bicarbonate: 41.4 mmol/L — ABNORMAL HIGH (ref 20.0–28.0)
O2 Content: 6 L/min
O2 Saturation: 89.2 %
Patient temperature: 37
pCO2 arterial: 70 mmHg (ref 32–48)
pH, Arterial: 7.38 (ref 7.35–7.45)
pO2, Arterial: 58 mmHg — ABNORMAL LOW (ref 83–108)

## 2024-10-20 LAB — CBC
HCT: 33.1 % — ABNORMAL LOW (ref 36.0–46.0)
Hemoglobin: 10 g/dL — ABNORMAL LOW (ref 12.0–15.0)
MCH: 26.5 pg (ref 26.0–34.0)
MCHC: 30.2 g/dL (ref 30.0–36.0)
MCV: 87.8 fL (ref 80.0–100.0)
Platelets: 179 K/uL (ref 150–400)
RBC: 3.77 MIL/uL — ABNORMAL LOW (ref 3.87–5.11)
RDW: 15.7 % — ABNORMAL HIGH (ref 11.5–15.5)
WBC: 3.9 K/uL — ABNORMAL LOW (ref 4.0–10.5)
nRBC: 0 % (ref 0.0–0.2)

## 2024-10-20 LAB — CULTURE, BLOOD (ROUTINE X 2)
Culture: NO GROWTH
Special Requests: ADEQUATE

## 2024-10-20 LAB — BASIC METABOLIC PANEL WITH GFR
Anion gap: 5 (ref 5–15)
BUN: 11 mg/dL (ref 8–23)
CO2: 36 mmol/L — ABNORMAL HIGH (ref 22–32)
Calcium: 10.5 mg/dL — ABNORMAL HIGH (ref 8.9–10.3)
Chloride: 102 mmol/L (ref 98–111)
Creatinine, Ser: 0.42 mg/dL — ABNORMAL LOW (ref 0.44–1.00)
GFR, Estimated: >60 mL/min
Glucose, Bld: 160 mg/dL — ABNORMAL HIGH (ref 70–99)
Potassium: 4 mmol/L (ref 3.5–5.1)
Sodium: 143 mmol/L (ref 135–145)

## 2024-10-20 LAB — GLUCOSE, CAPILLARY
Glucose-Capillary: 174 mg/dL — ABNORMAL HIGH (ref 70–99)
Glucose-Capillary: 211 mg/dL — ABNORMAL HIGH (ref 70–99)

## 2024-10-20 MED ORDER — BIOTENE DRY MOUTH MT LIQD: 15.0000 mL | OROMUCOSAL | Status: DC | PRN

## 2024-10-20 MED ORDER — MORPHINE SULFATE (PF) 2 MG/ML IV SOLN: 1.0000 mg | INTRAVENOUS | Status: DC | PRN

## 2024-10-20 MED ORDER — LEVETIRACETAM 500 MG PO TABS
500.0000 mg | ORAL_TABLET | Freq: Two times a day (BID) | ORAL | Status: DC
  Administered 2024-10-20 – 2024-10-21 (×2): 500 mg via ORAL
  Filled 2024-10-20 (×2): qty 1

## 2024-10-20 MED ORDER — MORPHINE SULFATE (PF) 2 MG/ML IV SOLN
1.0000 mg | INTRAVENOUS | Status: DC | PRN
  Administered 2024-10-21: 2 mg via INTRAVENOUS
  Administered 2024-10-21: 4 mg via INTRAVENOUS
  Administered 2024-10-21 (×3): 2 mg via INTRAVENOUS
  Filled 2024-10-20: qty 1
  Filled 2024-10-20: qty 2
  Filled 2024-10-20 (×3): qty 1

## 2024-10-20 MED ORDER — POLYVINYL ALCOHOL 1.4 % OP SOLN
1.0000 [drp] | Freq: Four times a day (QID) | OPHTHALMIC | Status: DC | PRN
  Filled 2024-10-20: qty 15

## 2024-10-20 MED ORDER — HALOPERIDOL LACTATE 2 MG/ML PO CONC
0.5000 mg | ORAL | Status: DC | PRN
  Filled 2024-10-20: qty 5

## 2024-10-20 MED ORDER — HALOPERIDOL 0.5 MG PO TABS
0.5000 mg | ORAL_TABLET | ORAL | Status: DC | PRN
  Filled 2024-10-20: qty 1

## 2024-10-20 MED ORDER — HALOPERIDOL LACTATE 5 MG/ML IJ SOLN: 0.5000 mg | INTRAMUSCULAR | Status: DC | PRN

## 2024-10-20 MED ORDER — LORAZEPAM 2 MG/ML PO CONC: 1.0000 mg | ORAL | Status: DC | PRN

## 2024-10-20 MED ORDER — LORAZEPAM 2 MG/ML IJ SOLN
1.0000 mg | INTRAMUSCULAR | Status: DC | PRN
  Administered 2024-10-20: 1 mg via INTRAVENOUS
  Filled 2024-10-20: qty 1

## 2024-10-20 MED ORDER — OXYCODONE-ACETAMINOPHEN 5-325 MG PO TABS: 1.0000 | ORAL_TABLET | ORAL | Status: DC | PRN

## 2024-10-20 MED ORDER — GLYCOPYRROLATE 1 MG PO TABS
1.0000 mg | ORAL_TABLET | ORAL | Status: DC | PRN
  Filled 2024-10-20: qty 1

## 2024-10-20 MED ORDER — LORAZEPAM 1 MG PO TABS: 1.0000 mg | ORAL_TABLET | ORAL | Status: DC | PRN

## 2024-10-20 MED ORDER — MORPHINE SULFATE (PF) 2 MG/ML IV SOLN
1.0000 mg | INTRAVENOUS | Status: DC | PRN
  Administered 2024-10-20 (×2): 2 mg via INTRAVENOUS
  Filled 2024-10-20 (×2): qty 1

## 2024-10-20 MED ORDER — GLYCOPYRROLATE 0.2 MG/ML IJ SOLN
0.2000 mg | INTRAMUSCULAR | Status: DC | PRN
  Filled 2024-10-20 (×2): qty 1

## 2024-10-20 MED ORDER — GLYCOPYRROLATE 0.2 MG/ML IJ SOLN
0.2000 mg | INTRAMUSCULAR | Status: DC | PRN
  Administered 2024-10-21 (×2): 0.2 mg via INTRAVENOUS
  Filled 2024-10-20 (×2): qty 1

## 2024-10-20 NOTE — Progress Notes (Signed)
 [facility]) HOSPITAL LIAISON NOTE  Follow up on referral received from Zenobia Frazier, RN/CM on 12.18.25 for education to patient's son regarding hospice at [facility] vs. [facility]).   Report received from Crystal Griffin prior to speaking with patient's son, Michele.  This RN called and spoke with Michele House, patient's son, to initiate education related to hospice philosophy, services, and team approach to care at [facility] vs. Hospice at [facility] and criteria.  Michele verbalized understanding of information given.  He would like his mother to return to [facility] with hospice.  Discussed symptom management at end of life.  He still wants her to go to [facility] with hospice managing those symptoms orally.    Information shared with [name], CM and hospital medical team.   Please send signed and completed DNR home with patient/family if applicable.   Please provide prescriptions at discharge as needed to ensure ongoing symptom management.   [facility] information and contact numbers given to Michele House.  Please call with any hospice related questions or concerns.  Thank you for the opportunity to participate in this patient's care.  [name], MA, BSN, RN, FNE Nurse Liaison [phone]

## 2024-10-20 NOTE — Plan of Care (Signed)
   Problem: Clinical Measurements: Goal: Respiratory complications will improve Outcome: Progressing

## 2024-10-20 NOTE — Progress Notes (Addendum)
 "                                                                                                                                                                                                          Daily Progress Note   Patient Name: Michele House       Date: 10/20/2024 DOB: 1956/08/13  Age: 68 y.o. MRN#: [id number] Attending Physician: Lanetta Lingo, MD Primary Care Physician: [facility] Admit Date: 10/15/2024  Reason for Consultation/Follow-up: Establishing goals of care  Subjective: Notes and labs reviewed.  Per notes patient disassembled her BiPAP overnight, and removed it again when it was reassembled and replaced.  In to see patient.  She looks to be mildly dyspneic.  She is currently sitting in bed at this time.  She states her son came to see her yesterday evening.  She states they discussed a plan for hospice and comfort care, and she is amenable to this plan. Discussed that I would call and confirm plans with Alm and she is in agreement.  Called to speak with son Alm, as daughter yesterday stated she is allowing Alm to make all healthcare decisions. He states he was able to speak with her last night and he is at peace with the decision for hospice care. He states at this time he is working out of town, and will be in [id number]. Discussed her current status.  We discussed that she does not want to wear a BiPAP; he voices understanding of increased CO2 levels and confusion that can come with this.  We discussed in detail, continued aggressive care and discussed comfort care, as well as her limited prognosis.  We discussed continuing oxygen.  He states he is amenable to stopping all other life-prolonging care to include antibiotics and shifting to comfort care. We discussed no longer checking her blood sugars and allowing her to have sweet things that she would like to eat that she previously has not been eating due to diabetes.  He states he would like to  speak with hospice liaison.  Team updated.  I completed a MOST form today through [employer] with son Alm and the signed original was placed in the chart. Each section of options on the form were reviewed in full detail and any questions were answered as needed. The form was scanned and sent to medical records for it to be uploaded under ACP tab in Epic. A photocopy was also placed in the chart to be scanned into EMR. The patient outlined their wishes for the following treatment  decisions:  Cardiopulmonary Resuscitation: Do Not Attempt Resuscitation (DNR/No CPR)  Medical Interventions: Comfort Measures: Keep clean, warm, and dry. Use medication by any route, positioning, wound care, and other measures to relieve pain and suffering. Use oxygen, suction and manual treatment of airway obstruction as needed for comfort. Do not transfer to the hospital unless comfort needs cannot be met in current location.  Antibiotics: No antibiotics (use other measures to relieve symptoms)  IV Fluids: No IV fluids (provide other measures to ensure comfort)  Feeding Tube: No feeding tube     Length of Stay: 4  Current Medications: Scheduled Meds:   busPIRone   20 mg Oral BID   Chlorhexidine  Gluconate Cloth  6 each Topical Daily   ipratropium-albuterol   3 mL Nebulization BID   levETIRAcetam   500 mg Oral BID   loratadine   10 mg Oral Daily   metoprolol  succinate  50 mg Oral Daily   pantoprazole   40 mg Oral BID AC   polyethylene glycol  17 g Oral Daily   pregabalin   50 mg Oral BID   rOPINIRole   4 mg Oral QHS   senna  1 tablet Oral BID   sertraline   100 mg Oral Daily   voriconazole   200 mg Oral Q12H    Continuous Infusions:   PRN Meds: acetaminophen  **OR** acetaminophen , alum & mag hydroxide-simeth, antiseptic oral rinse, artificial tears, glycopyrrolate  **OR** glycopyrrolate  **OR** glycopyrrolate , haloperidol  **OR** haloperidol  **OR** haloperidol  lactate, LORazepam  **OR** LORazepam  **OR** LORazepam , morphine   injection, ondansetron  **OR** ondansetron  (ZOFRAN ) IV, oxyCODONE -acetaminophen , traZODone   Physical Exam Pulmonary:     Effort: Pulmonary effort is normal.  Skin:    General: Skin is warm and dry.  Neurological:     Mental Status: She is alert.             Vital Signs: BP (!) 106/55   Pulse (!) 104   Temp 97.9 F (36.6 C) (Oral)   Resp 17   Ht 5' 1 (1.549 m)   Wt 66.7 kg   LMP 12/24/1998 Comment: prior to her hysterectomy  SpO2 97%   BMI 27.78 kg/m  SpO2: SpO2: 97 % O2 Device: O2 Device: High Flow Nasal Cannula O2 Flow Rate: O2 Flow Rate (L/min): 6 L/min  Intake/output summary:  Intake/Output Summary (Last 24 hours) at 10/20/2024 1241 Last data filed at 10/20/2024 0325 Gross per 24 hour  Intake 610 ml  Output --  Net 610 ml   LBM: Last BM Date : 10/19/24 Baseline Weight: Weight: 66.7 kg Most recent weight: Weight: 66.7 kg   Patient Active Problem List   Diagnosis Date Noted   Elevated troponin 10/17/2024   Anxiety and depression 10/16/2024   Type 2 diabetes mellitus with peripheral angiopathy (HCC) 10/16/2024   Hypercalcemia 10/16/2024   Acute encephalopathy 10/15/2024   Nephrolithiasis 09/18/2024   Sepsis due to pneumonia (HCC) 09/17/2024   COPD with acute exacerbation (HCC) 09/17/2024   Acute on chronic respiratory failure with hypoxia and hypercapnia (HCC) 09/17/2024   Seizure disorder (HCC) 09/17/2024   Dyslipidemia 09/17/2024   HFrEF (heart failure with reduced ejection fraction) (HCC) 09/17/2024   Type 2 diabetes mellitus with peripheral neuropathy (HCC) 09/17/2024   Acute respiratory failure with hypoxia and hypercarbia (HCC) 09/02/2024   Pressure injury of skin 09/02/2024   Encounter for antineoplastic chemotherapy 02/04/2024   IDA (iron  deficiency anemia) 01/07/2024   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) 12/17/2023   Anemia 12/17/2023   Acute deep vein thrombosis (DVT) of right lower extremity (HCC) 11/09/2023  Bilateral leg pain 11/09/2023    Melena 10/28/2023   Gastrointestinal hemorrhage with melena 10/28/2023   Duodenal ulcer 10/28/2023   CVA (cerebral vascular accident) (HCC) 10/14/2023   Hypotension 10/11/2023   Hypernatremia 10/11/2023   Uncontrolled type 2 diabetes mellitus with hypoglycemia, without long-term current use of insulin  (HCC) 10/08/2023   Acute metabolic encephalopathy 10/06/2023   Altered mental status 10/02/2023   UTI (urinary tract infection) 10/02/2023   (HFpEF) heart failure with preserved ejection fraction (HCC) 10/02/2023   Pneumonia 09/24/2023   COPD (chronic obstructive pulmonary disease) (HCC) 09/24/2023   Acute CHF (congestive heart failure) (HCC) 09/23/2023   COPD exacerbation (HCC) 06/07/2023   Acute on chronic respiratory failure with hypoxia (HCC) 06/07/2023   Allergic reaction 06/07/2023   Disequilibrium 07/16/2015   Pain in right shoulder 05/28/2015   Dysphagia 05/08/2015   Acute sinusitis 10/02/2014   Other specified anxiety disorders 09/19/2014   Low back pain 07/03/2014   Shoulder joint pain 05/01/2014   Other malaise 02/01/2014   Chest discomfort 10/12/2013   Tobacco use disorder 10/12/2013   Essential hypertension 10/12/2013   Obesity 10/12/2013   Diabetes mellitus (HCC) 10/12/2013   Allergic rhinitis 03/23/2012   Trigger finger 03/23/2012   Vitamin D  deficiency 03/23/2012   Pure hypercholesterolemia 09/05/2009   Hypothyroidism 12/24/2006   Osteoarthritis 12/24/2006   Scoliosis 12/24/2006    Palliative Care Assessment & Plan    Recommendations/Plan: Patient transitioned to comfort care. Code Status:    Code Status Orders  (From admission, onward)           Start     Ordered   10/20/24 1133  Do not attempt resuscitation (DNR) - Comfort care  Continuous       Question Answer Comment  If patient has no pulse and is not breathing Do Not Attempt Resuscitation   In Pre-Arrest Conditions (Patient Is Breathing and Has a Pulse) Provide comfort measures. Relieve  any mechanical airway obstruction. Avoid transfer unless required for comfort.   Consent: Discussion documented in EHR or advanced directives reviewed      10/20/24 1133           Code Status History     Date Active Date Inactive Code Status Order ID Comments User Context   10/20/2024 1128 10/20/2024 1133 Do not attempt resuscitation (DNR) - Comfort care [id number]  Signa Salines, NP Inpatient   10/19/2024 1215 10/20/2024 1128 Limited: Do not attempt resuscitation (DNR) -DNR-LIMITED -Do Not Intubate/DNI  [id number]  Signa Salines, NP Inpatient   10/15/2024 2311 10/19/2024 1215 Full Code [id number]  Mansy, Madison LABOR, MD ED   09/17/2024 2337 09/22/2024 2154 Full Code [id number]  Mansy, Madison LABOR, MD ED   09/01/2024 1100 09/05/2024 0241 Full Code [id number]  Laurita Cort DASEN, MD ED   01/18/2024 1124 01/19/2024 0518 Full Code [id number]  Luverne Aran, MD HOV   10/31/2023 1455 12/03/2023 0010 Do not attempt resuscitation (DNR) PRE-ARREST INTERVENTIONS DESIRED [id number]  Arloa Waddell RAMAN, NP Inpatient   10/02/2023 0738 10/31/2023 1455 Full Code [id number]  Eldonna Elspeth PARAS, MD ED   09/23/2023 2025 09/26/2023 2320 Full Code [id number]  Cleatus Delayne GAILS, MD ED   06/07/2023 1758 06/08/2023 2020 Full Code [id number]  Eldonna Elspeth PARAS, MD ED       Prognosis: < 1 week    Salines Signa, NP  Please contact Palliative Medicine Team phone at [phone] for questions and concerns.       "

## 2024-10-20 NOTE — Progress Notes (Signed)
 " Progress Note   Patient: Michele House FMW:[id number] DOB: [birth year] DOA: 10/15/2024     4 DOS: the patient was seen and examined on 10/20/2024   Brief hospital course: Michele House is a 68 y.o. female with medical history significant for GERD, COPD, type 2 diabetes mellitus, pretension, dyslipidemia, hypothyroidism and CVA, who presented to the emergency room with acute onset of altered mental status which has been worsening over the last 3 to 4 days with increased confusion and decreased responsiveness  Based on conversation with son and daughter, patient has been bedbound, living in long-term care facility, she has a baseline confusion.  She was on 3 L oxygen chronically, but does not wear a BiPAP or CPAP. Lab results showed hypercalcemia, also has severe hypoxia and hypercapnia on ABG. Metabolic encephalopathy is due to combined hypercalcemia and hypercapnia.   12/18 -noted to have episodes of hypoglycemia overnight requiring D5 infusion.  Lethargic this morning and per RN patient was hypoxic this morning with pulse oximetry in the 60s on room air.  She increased patient's oxygen supplementation to 6 L to improve her pulse oximetry.  Arterial blood gas shows compensated hypercapnia.  BiPAP requested.  Supposed to be on BiPAP at night      Assessment and Plan:  Acute metabolic encephalopathy. This appears to be secondary to hypercalcemia as well as respiratory failure with  hypoxemia and hypercapnia. Patient most likely has untreated obstructive sleep apnea.  She also had significant hypercalcemia due to small cell lung cancer on admission persists but shows a downward trend. Patient has been noncompliant with BiPAP. Appreciate palliative care input, patient is currently on comfort measures with plan to discharge back to skilled nursing facility with hospice      Acute on chronic respiratory failure with hypoxemia and hypercapnia. Severe COPD. Likely obstructive sleep  apnea. Patient does not seem to have COPD exacerbation, but she has severe hypoxemia and hypercapnia  Patient initially required high flow oxygen to maintain oxygen saturation and supposed to be on Bipap which she has not been able to tolerate Patient will be discharged back to skilled nursing facility with hospice      History of stroke with right hemiparesis. Failure to thrive. Bedbound status. Patient condition has been deteriorating over the last year, she has been bedbound since her last stroke. She does not have any potential for rehab.  Long-term prognosis is very poor. Patient has been seen by palliative care. Continue atorvastatin  and aspirin  Patient will be discharged back to skilled nursing facility with hospice     Small cell lung cancer. Hypercalcemia secondary to lung cancer.        Uncontrolled type 2 diabetes with hyperglycemia on chronic insulin  use. Hypoglycemia Pleasure feeds as tolerated     Overweight. BMI 27 Complicates overall prognosis and care     Seizure disorder Continue Keppra      Depression Continue BuSpar , sertraline  and trazodone      Hypertension Continue metoprolol      Hypothyroidism Continue Synthroid             Subjective: Appears comfortable.  Tells me she is worried about her daughter and would like to speak to her.  Physical Exam: Vitals:   10/20/24 0036 10/20/24 0319 10/20/24 0730 10/20/24 1100  BP: 102/65 (!) 146/93  (!) 106/55  Pulse: 91 (!) 102  (!) 104  Resp: 17     Temp: 98.7 F (37.1 C) 98.2 F (36.8 C)  97.9 F (36.6 C)  TempSrc:  Oral  Oral  SpO2: 95% 94% 90% 97%  Weight:      Height:       General exam: Awake Respiratory system: Decreased breath sounds. Respiratory effort normal. Cardiovascular system: S1 & S2 heard, RRR. No JVD, murmurs, rubs, gallops or clicks. No pedal edema. Gastrointestinal system: Abdomen is nondistended, soft and nontender. No organomegaly or masses felt. Normal bowel  sounds heard. Central nervous system: .  Right-sided hemiparesis from a prior stroke Extremities: Right-sided weakness Skin: No rashes, lesions or ulcers Psychiatry:  Mood & affect appropriate.       Data Reviewed: Labs reviewed.  Bicarb 36, calcium  10.5, hemoglobin 10.0, pCO2 70 Labs reviewed   Family Communication: Plan of care was discussed with patient's daughter over the phone.  All questions and concerns have been addressed.  She verbalizes understanding and agrees to the plan.  Disposition: Status is: Inpatient Remains inpatient appropriate because: For discharge to skilled nursing facility with hospice in am  Planned Discharge Destination: Hospice    Time spent: 45 minutes  Author: Aimee Somerset, MD 10/20/2024 2:26 PM  For on call review [email or web address].  "

## 2024-10-20 NOTE — [place] FL2 (Signed)
 " [place]  MEDICAID FL2 LEVEL OF CARE FORM     IDENTIFICATION  Patient Name: Michele House Birthdate: 1955/11/09 Sex: female Admission Date (Current Location): 10/15/2024  [facility] and [place] Number:  Chiropodist and Address:  [facility], [address], [place], [place] [address]      Provider Number: [id number]  Attending Physician Name and Address:  Lanetta Lingo, MD  Relative Name and Phone Number:       Current Level of Care: Hospital Recommended Level of Care: Skilled Nursing Facility (with hospice through [facility].) Prior Approval Number:    Date Approved/Denied:   PASRR Number: [id number]  Discharge Plan: SNF (with hospice through [facility].)    Current Diagnoses: Patient Active Problem List   Diagnosis Date Noted   Elevated troponin 10/17/2024   Anxiety and depression 10/16/2024   Type 2 diabetes mellitus with peripheral angiopathy (HCC) 10/16/2024   Hypercalcemia 10/16/2024   Acute encephalopathy 10/15/2024   Nephrolithiasis 09/18/2024   Sepsis due to pneumonia (HCC) 09/17/2024   COPD with acute exacerbation (HCC) 09/17/2024   Acute on chronic respiratory failure with hypoxia and hypercapnia (HCC) 09/17/2024   Seizure disorder (HCC) 09/17/2024   Dyslipidemia 09/17/2024   HFrEF (heart failure with reduced ejection fraction) (HCC) 09/17/2024   Type 2 diabetes mellitus with peripheral neuropathy (HCC) 09/17/2024   Acute respiratory failure with hypoxia and hypercarbia (HCC) 09/02/2024   Pressure injury of skin 09/02/2024   Encounter for antineoplastic chemotherapy 02/04/2024   IDA (iron  deficiency anemia) 01/07/2024   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) 12/17/2023   Anemia 12/17/2023   Acute deep vein thrombosis (DVT) of right lower extremity (HCC) 11/09/2023   Bilateral leg pain 11/09/2023   Melena 10/28/2023   Gastrointestinal hemorrhage with melena 10/28/2023   Duodenal ulcer 10/28/2023   CVA  (cerebral vascular accident) (HCC) 10/14/2023   Hypotension 10/11/2023   Hypernatremia 10/11/2023   Uncontrolled type 2 diabetes mellitus with hypoglycemia, without long-term current use of insulin  (HCC) 10/08/2023   Acute metabolic encephalopathy 10/06/2023   Altered mental status 10/02/2023   UTI (urinary tract infection) 10/02/2023   (HFpEF) heart failure with preserved ejection fraction (HCC) 10/02/2023   Pneumonia 09/24/2023   COPD (chronic obstructive pulmonary disease) (HCC) 09/24/2023   Acute CHF (congestive heart failure) (HCC) 09/23/2023   COPD exacerbation (HCC) 06/07/2023   Acute on chronic respiratory failure with hypoxia (HCC) 06/07/2023   Allergic reaction 06/07/2023   Disequilibrium 07/16/2015   Pain in right shoulder 05/28/2015   Dysphagia 05/08/2015   Acute sinusitis 10/02/2014   Other specified anxiety disorders 09/19/2014   Low back pain 07/03/2014   Shoulder joint pain 05/01/2014   Other malaise 02/01/2014   Chest discomfort 10/12/2013   Tobacco use disorder 10/12/2013   Essential hypertension 10/12/2013   Obesity 10/12/2013   Diabetes mellitus (HCC) 10/12/2013   Allergic rhinitis 03/23/2012   Trigger finger 03/23/2012   Vitamin D  deficiency 03/23/2012   Pure hypercholesterolemia 09/05/2009   Hypothyroidism 12/24/2006   Osteoarthritis 12/24/2006   Scoliosis 12/24/2006    Orientation RESPIRATION BLADDER Height & Weight     Self, Time, Place  Other (Comment), O2 (HFNC 6 L. Weaning as tolerated. Bipap QHS. Respiratory will put settings in tonight (12/19).) Incontinent Weight: 147 lb 0.8 oz (66.7 kg) Height:  5' 1 (154.9 cm)  BEHAVIORAL SYMPTOMS/MOOD NEUROLOGICAL BOWEL NUTRITION STATUS   (None)  (Seizure disorder) Incontinent Diet (DYS 3. Extra Gravies on meats, potatoes.  May have baked/sweet potatoes per  Speech ok w/ butters.)  AMBULATORY STATUS COMMUNICATION OF NEEDS Skin     Verbally Bruising, Other (Comment) (Erythema/redness.)                        Personal Care Assistance Level of Assistance              Functional Limitations Info  Sight, Hearing, Speech Sight Info: Adequate Hearing Info: Adequate Speech Info: Adequate    SPECIAL CARE FACTORS FREQUENCY                       Contractures Contractures Info: Not present    Additional Factors Info  Code Status, Allergies, Isolation Precautions Code Status Info: DNR Allergies Info: Penicillins, Aspirin , Sulfa Antibiotics     Isolation Precautions Info: Contact precautions: ESBL     Current Medications (10/20/2024):  This is the current hospital active medication list Current Facility-Administered Medications  Medication Dose Route Frequency Provider Last Rate Last Admin   acetaminophen  (TYLENOL ) tablet 650 mg  650 mg Oral Q6H PRN Mansy, Jan A, MD       Or   acetaminophen  (TYLENOL ) suppository 650 mg  650 mg Rectal Q6H PRN Mansy, Madison LABOR, MD       alum & mag hydroxide-simeth (MAALOX/MYLANTA) 200-200-20 MG/5ML suspension 20 mL  20 mL Oral Q6H PRN Mansy, Jan A, MD       antiseptic oral rinse (BIOTENE) solution 15 mL  15 mL Topical PRN Signa Salines, NP       artificial tears ophthalmic solution 1 drop  1 drop Both Eyes QID PRN Signa Salines, NP       busPIRone  (BUSPAR ) tablet 20 mg  20 mg Oral BID Mansy, Jan A, MD   20 mg at 10/20/24 1037   Chlorhexidine  Gluconate Cloth 2 % PADS 6 each  6 each Topical Daily Mansy, Jan A, MD   6 each at 10/20/24 1039   glycopyrrolate (ROBINUL) tablet 1 mg  1 mg Oral Q4H PRN Signa Salines, NP       Or   glycopyrrolate (ROBINUL) injection 0.2 mg  0.2 mg Subcutaneous Q4H PRN Signa Salines, NP       Or   glycopyrrolate (ROBINUL) injection 0.2 mg  0.2 mg Intravenous Q4H PRN Signa Salines, NP       haloperidol (HALDOL) tablet 0.5 mg  0.5 mg Oral Q4H PRN Signa Salines, NP       Or   haloperidol (HALDOL) 2 MG/ML solution 0.5 mg  0.5 mg Sublingual Q4H PRN Signa Salines, NP       Or   haloperidol lactate (HALDOL)  injection 0.5 mg  0.5 mg Intravenous Q4H PRN Griffin, Crystal, NP       ipratropium-albuterol  (DUONEB) 0.5-2.5 (3) MG/3ML nebulizer solution 3 mL  3 mL Nebulization BID Mansy, Jan A, MD   3 mL at 10/20/24 0730   levETIRAcetam  (KEPPRA ) tablet 500 mg  500 mg Oral BID Agbata, Tochukwu, MD   500 mg at 10/20/24 1037   loratadine  (CLARITIN ) tablet 10 mg  10 mg Oral Daily Mansy, Jan A, MD   10 mg at 10/20/24 1037   LORazepam  (ATIVAN ) tablet 1 mg  1 mg Oral Q4H PRN Signa Salines, NP       Or   LORazepam  (ATIVAN ) 2 MG/ML concentrated solution 1 mg  1 mg Sublingual Q4H PRN Signa Salines, NP       Or   LORazepam  (ATIVAN ) injection 1 mg  1 mg Intravenous Q4H PRN Griffin, Crystal, NP       metoprolol  succinate (TOPROL -XL) 24 hr tablet 50 mg  50 mg Oral Daily Mansy, Jan A, MD   50 mg at 10/20/24 1037   morphine  (PF) 2 MG/ML injection 1-4 mg  1-4 mg Intravenous Q15 min PRN Signa Salines, NP       ondansetron  (ZOFRAN ) tablet 4 mg  4 mg Oral Q6H PRN Mansy, Jan A, MD       Or   ondansetron  (ZOFRAN ) injection 4 mg  4 mg Intravenous Q6H PRN Mansy, Jan A, MD       oxyCODONE -acetaminophen  (PERCOCET/ROXICET) 5-325 MG per tablet 0.5 tablet  0.5 tablet Oral Q4H PRN Zhang, Dekui, MD   0.5 tablet at 10/19/24 2115   pantoprazole  (PROTONIX ) EC tablet 40 mg  40 mg Oral BID AC Mansy, Jan A, MD   40 mg at 10/20/24 1037   polyethylene glycol (MIRALAX  / GLYCOLAX ) packet 17 g  17 g Oral Daily Agbata, Tochukwu, MD   17 g at 10/20/24 1038   pregabalin  (LYRICA ) capsule 50 mg  50 mg Oral BID Mansy, Jan A, MD   50 mg at 10/20/24 1037   rOPINIRole  (REQUIP  XL) 24 hr tablet 4 mg  4 mg Oral QHS Mansy, Jan A, MD   4 mg at 10/19/24 2127   senna (SENOKOT) tablet 8.6 mg  1 tablet Oral BID Mansy, Jan A, MD   8.6 mg at 10/20/24 1037   sertraline  (ZOLOFT ) tablet 100 mg  100 mg Oral Daily Mansy, Jan A, MD   100 mg at 10/20/24 1038   traZODone  (DESYREL ) tablet 25 mg  25 mg Oral QHS PRN Mansy, Jan A, MD   25 mg at 10/19/24 2115    voriconazole  (VFEND ) tablet 200 mg  200 mg Oral Q12H Fitzgerald, David P, MD   200 mg at 10/20/24 1036   Facility-Administered Medications Ordered in Other Encounters  Medication Dose Route Frequency Provider Last Rate Last Admin   oxyCODONE  (Oxy IR/ROXICODONE ) immediate release tablet 5 mg  5 mg Oral Once Agrawal, Kavita, MD         Discharge Medications: Please see discharge summary for a list of discharge medications.  Relevant Imaging Results:  Relevant Lab Results:   Additional Information SS#: [id number]  Michele JAYSON Carpen, LCSW     "

## 2024-10-20 NOTE — TOC Progression Note (Signed)
 Transition of Care [facility]) - Progression Note    Patient Details  Name: Michele House MRN: [id number] Date of Birth: 08-Jan-1956  Transition of [facility]) CM/SW Contact  Lauraine JAYSON Carpen, LCSW Phone Number: 10/20/2024, 1:54 PM  Clinical Narrative:   [facility] can accept patient tomorrow. MD and [facility] liaison are aware.  Expected Discharge Plan and Services                                               Social Drivers of Health (SDOH) Interventions SDOH Screenings   Food Insecurity: Patient Unable To Answer (10/16/2024)  Housing: Unknown (10/18/2024)  Transportation Needs: Patient Unable To Answer (10/16/2024)  Utilities: Patient Unable To Answer (10/16/2024)  Depression (PHQ2-9): Low Risk (08/11/2024)  Financial Resource Strain: Low Risk  (10/05/2024)   Received from [facility]  Social Connections: Patient Unable To Answer (10/16/2024)  Recent Concern: Social Connections - Socially Isolated (09/01/2024)  Tobacco Use: Medium Risk (10/15/2024)    Readmission Risk Interventions    10/18/2024    9:39 AM  Readmission Risk Prevention Plan  Transportation Screening Complete  Medication Review (RN Care Manager) Complete  PCP or Specialist appointment within 3-5 days of discharge Complete  SW Recovery Care/Counseling Consult Complete  Skilled Nursing Facility Not Applicable

## 2024-10-20 NOTE — Progress Notes (Signed)
 Pt has removed the bipap mask twice within a period. First time she was heard yelling. Upon assessment the mask had been dismantled into pieces but not broken. No explanation given for these actions. The mask was reassembled and readjusted for optimal comfort. The second time, the pt was yelling with the mask halfway hanging off her face. She did say this time that the mask was hurting her face. No other adjustments are available that haven't already been attempted, so the mask was simply removed and her high flow  nasal cannula put on her and set on 6L. No other complaints voiced.

## 2024-10-20 NOTE — Progress Notes (Addendum)
 Report Called to [place], [place]

## 2024-10-21 DIAGNOSIS — Z515 Encounter for palliative care: Secondary | ICD-10-CM

## 2024-10-21 DIAGNOSIS — J449 Chronic obstructive pulmonary disease, unspecified: Secondary | ICD-10-CM

## 2024-10-21 DIAGNOSIS — G9341 Metabolic encephalopathy: Secondary | ICD-10-CM

## 2024-10-21 LAB — CULTURE, BLOOD (ROUTINE X 2)
Culture: NO GROWTH
Special Requests: ADEQUATE

## 2024-10-30 ENCOUNTER — Other Ambulatory Visit: Payer: Self-pay | Admitting: Oncology

## 2024-11-02 NOTE — Progress Notes (Signed)
 Pt report called for Pt to [facility]

## 2024-11-02 NOTE — Discharge Summary (Signed)
 " Physician Discharge Summary   Patient: Michele House MRN: [id number] DOB: 04-25-1956  Admit date:     10/15/2024  Discharge date: 11/03/2024  Discharge Physician: Anira Senegal   PCP: [place], [facility]   Recommendations at discharge:      Discharge Diagnoses: Principal Problem:   Acute metabolic encephalopathy Active Problems:   Hypercalcemia   Hypernatremia   Acute on chronic respiratory failure with hypoxia and hypercapnia (HCC)   CVA (cerebral vascular accident) (HCC)   Diabetes mellitus (HCC)   COPD (chronic obstructive pulmonary disease) (HCC)   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)   Anxiety and depression   Type 2 diabetes mellitus with peripheral angiopathy (HCC)   Elevated troponin  Resolved Problems:   * No resolved hospital problems. *  Hospital Course:  Michele House is a 69 y.o. female with medical history significant for GERD, COPD, type 2 diabetes mellitus, pretension, dyslipidemia, hypothyroidism and CVA, who presented to the emergency room with acute onset of altered mental status which has been worsening over the last 3 to 4 days with increased confusion and decreased responsiveness  Based on conversation with son and daughter, patient has been bedbound, living in long-term care facility, she has a baseline confusion.  She was on 3 L oxygen chronically, but does not wear a BiPAP or CPAP. Lab results showed hypercalcemia, also has severe hypoxia and hypercapnia on ABG. Metabolic encephalopathy is due to combined hypercalcemia and hypercapnia.   12/18 -noted to have episodes of hypoglycemia overnight requiring D5 infusion.  Lethargic this morning and per RN patient was hypoxic this morning with pulse oximetry in the 60s on room air.  She increased patient's oxygen supplementation to 6 L to improve her pulse oximetry.  Arterial blood gas shows compensated hypercapnia.  BiPAP requested.  Supposed to be on BiPAP at night  12/19 -patient  refusing BiPAP.  Met with palliative care and the plan is for discharge to inpatient hospice.  Comfort measures initiated   2024/11/03 -patient seen and examined at the bedside.  Awake, confused.  Will discharge to inpatient hospice.       Assessment and Plan:  Acute metabolic encephalopathy. This appears to be secondary to hypercalcemia as well as respiratory failure with  hypoxemia and hypercapnia. Patient has been discharged to a skilled nursing facility with hospice       Acute on chronic respiratory failure with hypoxemia and hypercapnia. Severe COPD. Likely obstructive sleep apnea. Patient does not seem to have COPD exacerbation, but she has severe hypoxemia and hypercapnia  Patient initially required high flow oxygen to maintain oxygen saturation and supposed to be on Bipap which she has not been able to tolerate and continues to refuse Patient will be discharged back to skilled nursing facility with hospice       History of stroke with right hemiparesis. Failure to thrive. Bedbound status. Patient condition has been deteriorating over the last year, she has been bedbound since her last stroke. She does not have any potential for rehab.  Long-term prognosis is very poor. Patient has been seen by palliative care and will be discharged back to SNF for hospice      Small cell lung cancer. Hypercalcemia secondary to lung cancer.         Uncontrolled type 2 diabetes with hyperglycemia on chronic insulin  use. Hypoglycemia Pleasure feeds as tolerated     Overweight. BMI 27 Complicates overall prognosis and care     Seizure disorder Patient is  being discharged back to SNF with hospice     Depression Patient is being discharged back to SNF with hospice     Hypertension Comfort measures     Hypothyroidism Comfort measures            Consultants: Palliative care Procedures performed: Noninvasive mechanical ventilation Disposition: Hospice care Diet  recommendation:  Cardiac and Carb modified diet DISCHARGE MEDICATION: Allergies as of November 08, 2024       Reactions   Penicillins Hives   Aspirin  Other (See Comments)   Lips numb   Sulfa Antibiotics         Medication List     STOP taking these medications    (feeding supplement) PROSource Plus liquid   alum & mag hydroxide-simeth 400-400-40 MG/5ML suspension Commonly known as: MAALOX PLUS   aspirin  EC 81 MG tablet   atorvastatin  20 MG tablet Commonly known as: LIPITOR   busPIRone  10 MG tablet Commonly known as: BUSPAR    calcium  carbonate 500 MG chewable tablet Commonly known as: TUMS - dosed in mg elemental calcium    cetirizine 10 MG tablet Commonly known as: ZYRTEC   collagenase  250 UNIT/GM ointment Commonly known as: SANTYL    Dulera 200-5 MCG/ACT Aero Generic drug: mometasone-formoterol   feeding supplement (GLUCERNA SHAKE) Liqd   FeroSul 325 (65 Fe) MG tablet Generic drug: ferrous sulfate    furosemide  20 MG tablet Commonly known as: LASIX    glipiZIDE  5 MG tablet Commonly known as: GLUCOTROL    insulin  aspart 100 UNIT/ML injection Commonly known as: novoLOG    insulin  glargine-yfgn 100 UNIT/ML injection Commonly known as: SEMGLEE    ipratropium 17 MCG/ACT inhaler Commonly known as: ATROVENT HFA   levETIRAcetam  500 MG tablet Commonly known as: KEPPRA    levothyroxine  125 MCG tablet Commonly known as: SYNTHROID    lidocaine -prilocaine  cream Commonly known as: EMLA    metoprolol  succinate 50 MG 24 hr tablet Commonly known as: TOPROL -XL   multivitamin capsule   nutrition supplement (JUVEN) Pack   nystatin  powder Commonly known as: MYCOSTATIN /NYSTOP    ondansetron  4 MG/5ML solution Commonly known as: ZOFRAN    pantoprazole  40 MG tablet Commonly known as: PROTONIX    polyethylene glycol 17 g packet Commonly known as: MIRALAX  / GLYCOLAX    pregabalin  50 MG capsule Commonly known as: LYRICA    rOPINIRole  4 MG 24 hr tablet Commonly known as:  REQUIP  XL   senna 8.6 MG Tabs tablet Commonly known as: SENOKOT   sertraline  100 MG tablet Commonly known as: ZOLOFT    spironolactone  25 MG tablet Commonly known as: ALDACTONE    Tradjenta  5 MG Tabs tablet Generic drug: linagliptin    voriconazole  200 MG tablet Commonly known as: VFEND    ZINC  OXIDE (TOPICAL) 25 % Oint       TAKE these medications    acetaminophen  325 MG tablet Commonly known as: TYLENOL  Take 650 mg by mouth every 6 (six) hours as needed.   ipratropium-albuterol  0.5-2.5 (3) MG/3ML Soln Commonly known as: DUONEB Take 3 mLs by nebulization 2 (two) times daily.   LORazepam  0.5 MG tablet Commonly known as: ATIVAN  Take 0.5 mg by mouth every 8 (eight) hours as needed for anxiety.        Contact information for follow-up providers     [place], [facility] Follow up.   Specialty: Skilled Nursing Facility Why: Hospital follow up Contact information: [address] [place] [address] [phone]              Contact information for after-discharge care     Destination     [facility] [place], [place]. [place]   Service: Skilled Nursing Contact information: [address] [place]  [address] [phone]                    Discharge Exam: Fredricka Weights   10/15/24 2011  Weight: 66.7 kg   General exam: Awake, confused Respiratory system: Decreased breath sounds.  Tachypneic, nasal cannula in place Cardiovascular system: S1 & S2 heard, RRR. No JVD, murmurs, rubs, gallops or clicks. No pedal edema. Gastrointestinal system: Abdomen is nondistended, soft and nontender. No organomegaly or masses felt. Normal bowel sounds heard. Central nervous system: .  Right-sided hemiparesis from a prior stroke Extremities: Right-sided weakness Skin: No rashes, lesions or ulcers Psychiatry:  Mood & affect appropriate.   Condition at discharge: stable  The results of significant diagnostics from this hospitalization (including  imaging, microbiology, ancillary and laboratory) are listed below for reference.   Imaging Studies: CT CHEST WO CONTRAST Result Date: 10/19/2024 EXAM: CT CHEST WITHOUT CONTRAST 10/19/2024 04:54:06 PM TECHNIQUE: CT of the chest was performed without the administration of intravenous contrast. Multiplanar reformatted images are provided for review. Automated exposure control, iterative reconstruction, and/or weight based adjustment of the mA/kV was utilized to reduce the radiation dose to as low as reasonably achievable. COMPARISON: Comparison with 09/17/2024 and same day x-ray. CLINICAL HISTORY: Pleural effusion, known or suspected (Ped 0-17y). FINDINGS: MEDIASTINUM: Heart and pericardium are unremarkable. The central airways are clear. LYMPH NODES: Similar borderline mediastinal adenopathy. Hilar lymph nodes are not well assessed without IV contrast. . LUNGS AND PLEURA: Moderate right and small left pleural effusions. The right pleural effusion has increased compared to 09/17/2024. Diffuse interlobular septal thickening. Peribronchovascular consolidation in the right upper, and right lower lobes. The mass the right hilum is not well visualized due to adjacent consolidation and lack of IV contrast. Bronchial wall thickening with mucous plugging greatest in the lower lobes. Additional bilateral lower lobes atelectasis or consolidation. There is improved aeration of the left lower lobe compared to 09/17/2024. No pneumothorax. SOFT TISSUES/BONES: Left chest wall port-a-cath. No acute abnormality of the bones. UPPER ABDOMEN: Limited images of the upper abdomen demonstrates no acute abnormality. IMPRESSION: 1. Moderate right pleural effusion, increased compared to 09/17/2024, and similar small left pleural effusion. 2. Wall thickening with mucous plugging and peribronchovascular consolidation suggesting aspiration/bronchopneumonia. There is improved aeration of the left lower lobe compared to 09/17/2024. 3. The known  right hilar mass is not well visualized due to adjacent atelectasis/consolidation and lack of IV contrast. 4. Diffuse interlobular septal thickening may be due to lymphangitic carcinomatosis, infection or superimposed pulmonary edema. Electronically signed by: Norman Gatlin MD 10/19/2024 09:13 PM EST RP Workstation: [id number]   DG Chest 1 View Result Date: 10/19/2024 CLINICAL DATA:  Fluid excess. EXAM: CHEST  1 VIEW COMPARISON:  10/15/2024 FINDINGS: Left IJ central venous catheter unchanged. Lungs are hypoinflated demonstrate hazy airspace process bilaterally most prominent over the upper lobes with possible interval worsening over the left upper lobe. Possible small amount of bilateral pleural fluid with basilar atelectasis unchanged. Cardiomediastinal silhouette and remainder of the exam is unchanged. IMPRESSION: 1. Hazy airspace process bilaterally most prominent over the upper lobes with possible interval worsening over the left upper lobe. Findings likely due to infection. 2. Possible small amount of bilateral pleural fluid with basilar atelectasis unchanged. Electronically Signed   By: Toribio Agreste M.D.   On: 10/19/2024 08:58   CT Head Wo Contrast Result Date: 10/15/2024 EXAM: CT HEAD WITHOUT 10/15/2024 09:31:00 PM TECHNIQUE:  CT of the head was performed without the administration of intravenous contrast. Automated exposure control, iterative reconstruction, and/or weight based adjustment of the mA/kV was utilized to reduce the radiation dose to as low as reasonably achievable. COMPARISON: MRI brain dated 01/12/2024. CLINICAL HISTORY: ams ams FINDINGS: BRAIN AND VENTRICLES: No acute intracranial hemorrhage. No mass effect or midline shift. No extra-axial fluid collection. No evidence of acute infarct. No hydrocephalus. Global cortical atrophy. Subcortical and periventricular small vessel ischemic changes. ORBITS: No acute abnormality. SINUSES AND MASTOIDS: No acute abnormality. SOFT TISSUES AND  SKULL: No acute skull fracture. No acute soft tissue abnormality. IMPRESSION: 1. No acute intracranial abnormality. Electronically signed by: Pinkie Pebbles MD 10/15/2024 09:33 PM EST RP Workstation: [id number]   DG Chest Port 1 View Result Date: 10/15/2024 EXAM: 1 VIEW(S) XRAY OF THE CHEST 10/15/2024 08:52:00 PM COMPARISON: 09/22/2024 CLINICAL HISTORY: Questionable sepsis - evaluate for abnormality FINDINGS: LINES, TUBES AND DEVICES: Stable left chest port with tip overlying the SVC. LUNGS AND PLEURA: Similar right upper lung mass and bibasilar opacities. Similar diffuse interstitial prominence. Small right pleural effusion. No pneumothorax. HEART AND MEDIASTINUM: No acute abnormality of the cardiac and mediastinal silhouettes. BONES AND SOFT TISSUES: No acute osseous abnormality. IMPRESSION: 1. Similar right upper lung mass and bibasilar opacities. 2. Similar diffuse interstitial prominence. 3. Small right pleural effusion. Electronically signed by: Oneil Devonshire MD 10/15/2024 09:01 PM EST RP Workstation: [id number]   X-ray chest PA or AP Result Date: 09/22/2024 EXAM: 1 VIEW(S) XRAY OF THE CHEST 09/22/2024 09:38:00 AM COMPARISON: 09/17/2024 CLINICAL HISTORY: Status post bronchoscopy [id number] FINDINGS: LINES, TUBES AND DEVICES: Left chest wall port in place with tip projecting over the SVC, unchanged. LUNGS AND PLEURA: Similar appearance of right mid and upper lung opacities, slightly increased opacities in the right lung base, and diffuse hazy opacities in the left lung, similar to prior. Trace bilateral pleural effusions. No pneumothorax. HEART AND MEDIASTINUM: No acute abnormality of the cardiac and mediastinal silhouettes. The tip of the left chest wall port projects over the SVC. BONES AND SOFT TISSUES: No acute osseous abnormality. The left chest wall port is in place. IMPRESSION: 1. Similar right mid and upper lung opacities likely related to neoplastic process better seen on CT. Slight interval  increase at the right lung base, favor atelectasis. 2. Diffuse opacities in the left lung, unchanged. Concerning for infection. 3. Trace bilateral pleural effusions. Electronically signed by: Donnice Mania MD 09/22/2024 01:43 PM EST RP Workstation: [id number]    Microbiology: Results for orders placed or performed during the hospital encounter of 10/15/24  Blood Culture (routine x 2)     Status: None   Collection Time: 10/15/24  8:33 PM   Specimen: Right Antecubital; Blood  Result Value Ref Range Status   Specimen Description RIGHT ANTECUBITAL  Final   Special Requests   Final    BOTTLES DRAWN AEROBIC AND ANAEROBIC Blood Culture adequate volume   Culture   Final    NO GROWTH 5 DAYS Performed at [facility], [address] [place], [place] [address]    Report Status 10/20/2024 FINAL  Final  Resp panel by RT-PCR (RSV, Flu A&B, Covid) Anterior Nasal Swab     Status: None   Collection Time: 10/15/24  8:41 PM   Specimen: Anterior Nasal Swab  Result Value Ref Range Status   SARS Coronavirus 2 by RT PCR NEGATIVE NEGATIVE Final    Comment: (NOTE) SARS-CoV-2 target nucleic acids are NOT DETECTED.  The SARS-CoV-2 RNA is generally detectable  in upper respiratory specimens during the acute phase of infection. The lowest concentration of SARS-CoV-2 viral copies this assay can detect is 138 copies/mL. A negative result does not preclude SARS-Cov-2 infection and should not be used as the sole basis for treatment or other patient management decisions. A negative result may occur with  improper specimen collection/handling, submission of specimen other than nasopharyngeal swab, presence of viral mutation(s) within the areas targeted by this assay, and inadequate number of viral copies(<138 copies/mL). A negative result must be combined with clinical observations, patient history, and epidemiological information. The expected result is Negative.  Fact Sheet for Patients:   bloggercourse.com  Fact Sheet for Healthcare Providers:  seriousbroker.it  This test is no t yet approved or cleared by the [place]  FDA and  has been authorized for detection and/or diagnosis of SARS-CoV-2 by FDA under an Emergency Use Authorization (EUA). This EUA will remain  in effect (meaning this test can be used) for the duration of the COVID-19 declaration under Section 564(b)(1) of the Act, 21 U.S.C.section 360bbb-3(b)(1), unless the authorization is terminated  or revoked sooner.       Influenza A by PCR NEGATIVE NEGATIVE Final   Influenza B by PCR NEGATIVE NEGATIVE Final    Comment: (NOTE) The Xpert Xpress SARS-CoV-2/FLU/RSV plus assay is intended as an aid in the diagnosis of influenza from Nasopharyngeal swab specimens and should not be used as a sole basis for treatment. Nasal washings and aspirates are unacceptable for Xpert Xpress SARS-CoV-2/FLU/RSV testing.  Fact Sheet for Patients: bloggercourse.com  Fact Sheet for Healthcare Providers: seriousbroker.it  This test is not yet approved or cleared by the [place]  FDA and has been authorized for detection and/or diagnosis of SARS-CoV-2 by FDA under an Emergency Use Authorization (EUA). This EUA will remain in effect (meaning this test can be used) for the duration of the COVID-19 declaration under Section 564(b)(1) of the Act, 21 U.S.C. section 360bbb-3(b)(1), unless the authorization is terminated or revoked.     Resp Syncytial Virus by PCR NEGATIVE NEGATIVE Final    Comment: (NOTE) Fact Sheet for Patients: bloggercourse.com  Fact Sheet for Healthcare Providers: seriousbroker.it  This test is not yet approved or cleared by the [place]  FDA and has been authorized for detection and/or diagnosis of SARS-CoV-2 by FDA under an Emergency Use  Authorization (EUA). This EUA will remain in effect (meaning this test can be used) for the duration of the COVID-19 declaration under Section 564(b)(1) of the Act, 21 U.S.C. section 360bbb-3(b)(1), unless the authorization is terminated or revoked.  Performed at [facility], [address] [place], [place] [address]   Blood Culture (routine x 2)     Status: None   Collection Time: 10/16/24  2:22 AM   Specimen: BLOOD  Result Value Ref Range Status   Specimen Description BLOOD BLOOD LEFT HAND  Final   Special Requests   Final    BOTTLES DRAWN AEROBIC AND ANAEROBIC Blood Culture adequate volume   Culture   Final    NO GROWTH 5 DAYS Performed at [facility], [address] [place], [place] [address]    Report Status 11-13-2024 FINAL  Final    Labs: CBC: Recent Labs  Lab 10/15/24 2033 10/16/24 0222 10/19/24 1148 10/20/24 0400  WBC 5.2 8.2 5.2 3.9*  NEUTROABS 4.4  --   --   --   HGB 11.2* 11.5* 11.0* 10.0*  HCT 37.7 37.8 35.8* 33.1*  MCV 88.1 85.7 84.6 87.8  PLT  202 302 221 179   Basic Metabolic Panel: Recent Labs  Lab 10/16/24 0222 10/17/24 0536 10/18/24 0445 10/19/24 0559 10/20/24 0400  NA 144 146* 143 144 143  K 4.6 4.1 3.7 4.1 4.0  CL 102 105 103 103 102  CO2 32 31 34* 33* 36*  GLUCOSE 319* 156* 104* 172* 160*  BUN 21 20 12 8 11   CREATININE 0.57 0.54 0.40* 0.39* 0.42*  CALCIUM  12.0* 10.8* 10.0 10.9* 10.5*  MG  --   --  1.9  --   --    Liver Function Tests: Recent Labs  Lab 10/15/24 2033  AST 24  ALT 6  ALKPHOS 113  BILITOT 0.2  PROT 6.2*  ALBUMIN 3.2*   CBG: Recent Labs  Lab 10/19/24 1410 10/19/24 1621 10/19/24 2052 10/20/24 0652 10/20/24 0852  GLUCAP 137* 182* 190* 174* 211*    Discharge time spent: greater than 30 minutes.  Signed: Aimee Somerset, MD [facility] 25-Oct-2024 "

## 2024-11-02 NOTE — Progress Notes (Signed)
 "                                                                                                                                                                                                          Daily Progress Note   Patient Name: Michele House       Date: 10/31/2024 DOB: 1955/12/30  Age: 69 y.o. MRN#: [id number] Attending Physician: Michele Lingo, MD Primary Care Physician: Michele House Resources Admit Date: 10/15/2024  Reason for Consultation/Follow-up: Establishing goals of care  Patient Profile/HPI: Patient is a 69 yo female with PMH of GERD, COPD, DM2, hypertension, HLD, GERD, COPD, DM2, hypothyroid, COPD, CVA- admitted on 10/15/2024 with increased confusion and altered mental status. Admitted with metabolic encephalopathy due to hypercalcemia, respiratory failure and severe COPD. Palliative medicine consulted for goals of care.  Transitioned to comfort measures only.   Subjective: Chart reviewed including labs, progress notes, imaging from this and previous encounters.  On eval patient had increased RR and was moaning. Did not respond to me.  I discussed care with RN and asked him to please administer another dose of IV morphine  and to utilize IV lorazepam  as well.   Review of Systems  Unable to perform ROS: Mental status change     Physical Exam Vitals and nursing note reviewed.  Constitutional:      Appearance: She is ill-appearing.  Cardiovascular:     Rate and Rhythm: Normal rate.  Pulmonary:     Comments: Increased rate and effort            Vital Signs: BP 136/69 (BP Location: Left Leg)   Pulse (!) 107   Temp 99.6 F (37.6 C) (Oral)   Resp (!) 22   Ht 5' 1 (1.549 m)   Wt 66.7 kg   LMP 12/24/1998 Comment: prior to her hysterectomy  SpO2 (!) 87%   BMI 27.78 kg/m  SpO2: SpO2: (!) 87 % O2 Device: O2 Device: Nasal Cannula O2 Flow Rate: O2 Flow Rate (L/min): 6 L/min  Intake/output summary:  Intake/Output Summary (Last 24 hours) at Oct 31, 2024  1259 Last data filed at 10-31-2024 0900 Gross per 24 hour  Intake 0 ml  Output 200 ml  Net -200 ml   LBM: Last BM Date : 10/19/24 Baseline Weight: Weight: 66.7 kg Most recent weight: Weight: 66.7 kg       Palliative Assessment/Data: PPS: 20%      Patient Active Problem List   Diagnosis Date Noted   Elevated troponin 10/17/2024   Anxiety and depression 10/16/2024   Type 2  diabetes mellitus with peripheral angiopathy (HCC) 10/16/2024   Hypercalcemia 10/16/2024   Acute encephalopathy 10/15/2024   Nephrolithiasis 09/18/2024   Sepsis due to pneumonia (HCC) 09/17/2024   COPD with acute exacerbation (HCC) 09/17/2024   Acute on chronic respiratory failure with hypoxia and hypercapnia (HCC) 09/17/2024   Seizure disorder (HCC) 09/17/2024   Dyslipidemia 09/17/2024   HFrEF (heart failure with reduced ejection fraction) (HCC) 09/17/2024   Type 2 diabetes mellitus with peripheral neuropathy (HCC) 09/17/2024   Acute respiratory failure with hypoxia and hypercarbia (HCC) 09/02/2024   Pressure injury of skin 09/02/2024   Encounter for antineoplastic chemotherapy 02/04/2024   IDA (iron  deficiency anemia) 01/07/2024   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) 12/17/2023   Anemia 12/17/2023   Acute deep vein thrombosis (DVT) of right lower extremity (HCC) 11/09/2023   Bilateral leg pain 11/09/2023   Melena 10/28/2023   Gastrointestinal hemorrhage with melena 10/28/2023   Duodenal ulcer 10/28/2023   CVA (cerebral vascular accident) (HCC) 10/14/2023   Hypotension 10/11/2023   Hypernatremia 10/11/2023   Uncontrolled type 2 diabetes mellitus with hypoglycemia, without long-term current use of insulin  (HCC) 10/08/2023   Acute metabolic encephalopathy 10/06/2023   Altered mental status 10/02/2023   UTI (urinary tract infection) 10/02/2023   (HFpEF) heart failure with preserved ejection fraction (HCC) 10/02/2023   Pneumonia 09/24/2023   COPD (chronic obstructive pulmonary disease) (HCC)  09/24/2023   Acute CHF (congestive heart failure) (HCC) 09/23/2023   COPD exacerbation (HCC) 06/07/2023   Acute on chronic respiratory failure with hypoxia (HCC) 06/07/2023   Allergic reaction 06/07/2023   Disequilibrium 07/16/2015   Pain in right shoulder 05/28/2015   Dysphagia 05/08/2015   Acute sinusitis 10/02/2014   Other specified anxiety disorders 09/19/2014   Low back pain 07/03/2014   Shoulder joint pain 05/01/2014   Other malaise 02/01/2014   Chest discomfort 10/12/2013   Tobacco use disorder 10/12/2013   Essential hypertension 10/12/2013   Obesity 10/12/2013   Diabetes mellitus (HCC) 10/12/2013   Allergic rhinitis 03/23/2012   Trigger finger 03/23/2012   Vitamin D  deficiency 03/23/2012   Pure hypercholesterolemia 09/05/2009   Hypothyroidism 12/24/2006   Osteoarthritis 12/24/2006   Scoliosis 12/24/2006    Palliative Care Assessment & Plan    Assessment/Recommendations/Plan  Severe COPD- comfort measures only- continue IV morphine  and IV lorazepam  prn for comfort Discussed with attending and hospice liaison- plan for d/c to inpatient hospice unit today   Code Status:   Code Status: Do not attempt resuscitation (DNR) - Comfort care   Prognosis:  < 2 weeks  Discharge Planning: Hospice facility    Thank you for allowing the Palliative Medicine Team to assist in the care of this patient.  Total time:  Prolonged billing:  Time includes:   Preparing to see the patient (e.g., review of tests) Obtaining and/or reviewing separately obtained history Performing a medically necessary appropriate examination and/or evaluation Counseling and educating the patient/family/caregiver Ordering medications, tests, or procedures Referring and communicating with other health care professionals (when not reported separately) Documenting clinical information in the electronic or other health record Independently interpreting results (not reported separately) and  communicating results to the patient/family/caregiver Care coordination (not reported separately) Clinical documentation  Michele House, AGNP-C Palliative Medicine   Please contact Palliative Medicine Team phone at [phone] for questions and concerns.        "

## 2024-11-02 NOTE — Progress Notes (Signed)
 [facility]) Liaison Note  Follow up on new hospice referral who is supposed to discharge to [facility] today with hospice services.  I spoke with the son, Alm to follow up on discharge plans.  Patient is needing IV symptom management.  Discussed with Alm and concerns that she will not be managed well at [facility] on PO medications.  He confirms she is struggling more.  After readdressing GOC and speaking about hospice IPU and needing to manage her symptoms by [facility] Alm would like to change her disposition/discharge to [facility].  I discussed with hospital medical care team who are in agreement.    Plan for patient to discharge today to [facility]) today for unmanaged symptom management for SOB, secretions and anxiety.  RN to call report to the hospice home [phone].  [facility] arranged for 1pm pick up.   Alm will complete hospice consents prior to discharge.  Please send DNR with patient at transport. Please medicate patient prior to transport for comfort as needed.  Please call with any hospice related questions or concerns.  [name], RN Nurse Liaison [phone]

## 2024-11-02 DEATH — deceased

## 2024-11-03 ENCOUNTER — Inpatient Hospital Stay: Admitting: Oncology

## 2024-11-03 ENCOUNTER — Inpatient Hospital Stay

## 2024-11-10 ENCOUNTER — Inpatient Hospital Stay: Admitting: Oncology

## 2024-11-10 ENCOUNTER — Inpatient Hospital Stay

## 2024-12-01 ENCOUNTER — Inpatient Hospital Stay

## 2024-12-01 ENCOUNTER — Inpatient Hospital Stay: Admitting: Oncology

## 2024-12-08 ENCOUNTER — Inpatient Hospital Stay

## 2024-12-08 ENCOUNTER — Inpatient Hospital Stay: Admitting: Oncology

## 2024-12-29 ENCOUNTER — Inpatient Hospital Stay

## 2024-12-29 ENCOUNTER — Inpatient Hospital Stay: Admitting: Oncology

## 2025-01-05 ENCOUNTER — Inpatient Hospital Stay

## 2025-01-05 ENCOUNTER — Inpatient Hospital Stay: Admitting: Oncology
